# Patient Record
Sex: Female | Born: 1945 | Race: White | Hispanic: No | Smoking: Former smoker
Health system: Southern US, Community
[De-identification: ages and names within clinical notes are randomized; demographics above are authoritative.]

## PROBLEM LIST (undated history)

## (undated) DIAGNOSIS — C539 Malignant neoplasm of cervix uteri, unspecified: Secondary | ICD-10-CM

## (undated) DIAGNOSIS — I4891 Unspecified atrial fibrillation: Secondary | ICD-10-CM

## (undated) DIAGNOSIS — M1990 Unspecified osteoarthritis, unspecified site: Secondary | ICD-10-CM

## (undated) DIAGNOSIS — Z950 Presence of cardiac pacemaker: Secondary | ICD-10-CM

## (undated) DIAGNOSIS — G4730 Sleep apnea, unspecified: Secondary | ICD-10-CM

## (undated) DIAGNOSIS — I4821 Permanent atrial fibrillation: Secondary | ICD-10-CM

## (undated) DIAGNOSIS — K219 Gastro-esophageal reflux disease without esophagitis: Secondary | ICD-10-CM

## (undated) DIAGNOSIS — L0390 Cellulitis, unspecified: Secondary | ICD-10-CM

## (undated) DIAGNOSIS — I428 Other cardiomyopathies: Secondary | ICD-10-CM

## (undated) DIAGNOSIS — D649 Anemia, unspecified: Secondary | ICD-10-CM

## (undated) DIAGNOSIS — K5792 Diverticulitis of intestine, part unspecified, without perforation or abscess without bleeding: Secondary | ICD-10-CM

## (undated) DIAGNOSIS — N200 Calculus of kidney: Secondary | ICD-10-CM

## (undated) DIAGNOSIS — I5042 Chronic combined systolic (congestive) and diastolic (congestive) heart failure: Secondary | ICD-10-CM

## (undated) DIAGNOSIS — I495 Sick sinus syndrome: Secondary | ICD-10-CM

## (undated) DIAGNOSIS — I10 Essential (primary) hypertension: Secondary | ICD-10-CM

## (undated) HISTORY — DX: Sick sinus syndrome: I49.5

## (undated) HISTORY — PX: EYE SURGERY: SHX253

## (undated) HISTORY — DX: Unspecified osteoarthritis, unspecified site: M19.90

## (undated) HISTORY — DX: Malignant neoplasm of cervix uteri, unspecified: C53.9

---

## 1989-09-25 DIAGNOSIS — C539 Malignant neoplasm of cervix uteri, unspecified: Secondary | ICD-10-CM

## 1989-09-25 HISTORY — PX: ABDOMINAL HYSTERECTOMY: SHX81

## 1989-09-25 HISTORY — DX: Malignant neoplasm of cervix uteri, unspecified: C53.9

## 1996-09-25 HISTORY — PX: COLONOSCOPY: SHX174

## 1998-06-04 ENCOUNTER — Ambulatory Visit: Admission: RE | Admit: 1998-06-04 | Discharge: 1998-06-04 | Payer: Self-pay | Admitting: *Deleted

## 1998-07-30 ENCOUNTER — Ambulatory Visit (HOSPITAL_COMMUNITY): Admission: RE | Admit: 1998-07-30 | Discharge: 1998-07-30 | Payer: Self-pay | Admitting: *Deleted

## 1998-12-19 ENCOUNTER — Encounter: Payer: Self-pay | Admitting: Emergency Medicine

## 1998-12-19 ENCOUNTER — Inpatient Hospital Stay (HOSPITAL_COMMUNITY): Admission: EM | Admit: 1998-12-19 | Discharge: 1998-12-22 | Payer: Self-pay | Admitting: Emergency Medicine

## 1998-12-21 ENCOUNTER — Encounter: Payer: Self-pay | Admitting: Internal Medicine

## 1999-02-04 ENCOUNTER — Inpatient Hospital Stay (HOSPITAL_COMMUNITY): Admission: EM | Admit: 1999-02-04 | Discharge: 1999-02-06 | Payer: Self-pay | Admitting: Emergency Medicine

## 1999-03-26 ENCOUNTER — Inpatient Hospital Stay (HOSPITAL_COMMUNITY): Admission: EM | Admit: 1999-03-26 | Discharge: 1999-04-05 | Payer: Self-pay | Admitting: *Deleted

## 1999-05-24 ENCOUNTER — Encounter: Payer: Self-pay | Admitting: *Deleted

## 1999-05-24 ENCOUNTER — Ambulatory Visit (HOSPITAL_COMMUNITY): Admission: RE | Admit: 1999-05-24 | Discharge: 1999-05-24 | Payer: Self-pay | Admitting: *Deleted

## 1999-07-11 ENCOUNTER — Ambulatory Visit (HOSPITAL_COMMUNITY): Admission: RE | Admit: 1999-07-11 | Discharge: 1999-07-11 | Payer: Self-pay | Admitting: Internal Medicine

## 1999-07-27 HISTORY — PX: PACEMAKER INSERTION: SHX728

## 1999-08-04 ENCOUNTER — Inpatient Hospital Stay (HOSPITAL_COMMUNITY): Admission: RE | Admit: 1999-08-04 | Discharge: 1999-08-06 | Payer: Self-pay | Admitting: Internal Medicine

## 1999-08-06 ENCOUNTER — Encounter: Payer: Self-pay | Admitting: Internal Medicine

## 2003-02-26 ENCOUNTER — Encounter: Payer: Self-pay | Admitting: Internal Medicine

## 2003-02-26 ENCOUNTER — Ambulatory Visit (HOSPITAL_BASED_OUTPATIENT_CLINIC_OR_DEPARTMENT_OTHER): Admission: RE | Admit: 2003-02-26 | Discharge: 2003-02-26 | Payer: Self-pay | Admitting: Family Medicine

## 2003-02-26 ENCOUNTER — Encounter: Payer: Self-pay | Admitting: Pulmonary Disease

## 2003-03-26 ENCOUNTER — Encounter: Payer: Self-pay | Admitting: Family Medicine

## 2003-03-26 ENCOUNTER — Ambulatory Visit (HOSPITAL_COMMUNITY): Admission: RE | Admit: 2003-03-26 | Discharge: 2003-03-26 | Payer: Self-pay | Admitting: Family Medicine

## 2004-08-23 ENCOUNTER — Ambulatory Visit: Payer: Self-pay | Admitting: Internal Medicine

## 2004-10-18 ENCOUNTER — Ambulatory Visit: Payer: Self-pay | Admitting: Internal Medicine

## 2004-11-25 ENCOUNTER — Ambulatory Visit: Payer: Self-pay | Admitting: Internal Medicine

## 2004-12-20 ENCOUNTER — Ambulatory Visit: Payer: Self-pay | Admitting: Internal Medicine

## 2005-01-24 ENCOUNTER — Ambulatory Visit: Payer: Self-pay | Admitting: Internal Medicine

## 2005-03-14 ENCOUNTER — Ambulatory Visit: Payer: Self-pay | Admitting: Internal Medicine

## 2005-04-21 ENCOUNTER — Ambulatory Visit: Payer: Self-pay | Admitting: Internal Medicine

## 2005-05-25 ENCOUNTER — Ambulatory Visit: Payer: Self-pay | Admitting: Internal Medicine

## 2005-06-04 ENCOUNTER — Emergency Department (HOSPITAL_COMMUNITY): Admission: EM | Admit: 2005-06-04 | Discharge: 2005-06-04 | Payer: Self-pay | Admitting: Emergency Medicine

## 2005-06-07 ENCOUNTER — Ambulatory Visit: Payer: Self-pay | Admitting: Cardiology

## 2005-06-08 ENCOUNTER — Ambulatory Visit: Payer: Self-pay

## 2005-06-09 ENCOUNTER — Ambulatory Visit: Payer: Self-pay

## 2005-06-19 ENCOUNTER — Ambulatory Visit: Payer: Self-pay | Admitting: Internal Medicine

## 2005-06-22 ENCOUNTER — Inpatient Hospital Stay (HOSPITAL_BASED_OUTPATIENT_CLINIC_OR_DEPARTMENT_OTHER): Admission: RE | Admit: 2005-06-22 | Discharge: 2005-06-22 | Payer: Self-pay | Admitting: Cardiology

## 2005-06-22 ENCOUNTER — Ambulatory Visit: Payer: Self-pay | Admitting: Cardiology

## 2005-07-05 ENCOUNTER — Ambulatory Visit: Payer: Self-pay

## 2005-07-17 ENCOUNTER — Ambulatory Visit: Payer: Self-pay | Admitting: Internal Medicine

## 2005-08-22 ENCOUNTER — Ambulatory Visit: Payer: Self-pay | Admitting: Internal Medicine

## 2005-10-05 ENCOUNTER — Ambulatory Visit: Payer: Self-pay | Admitting: *Deleted

## 2005-11-28 ENCOUNTER — Ambulatory Visit: Payer: Self-pay | Admitting: Internal Medicine

## 2005-12-25 ENCOUNTER — Ambulatory Visit: Payer: Self-pay | Admitting: Internal Medicine

## 2006-02-26 ENCOUNTER — Ambulatory Visit: Payer: Self-pay | Admitting: Internal Medicine

## 2006-03-26 ENCOUNTER — Ambulatory Visit: Payer: Self-pay | Admitting: Internal Medicine

## 2006-04-30 ENCOUNTER — Ambulatory Visit: Payer: Self-pay | Admitting: Internal Medicine

## 2006-05-25 ENCOUNTER — Ambulatory Visit: Payer: Self-pay | Admitting: Internal Medicine

## 2006-06-26 ENCOUNTER — Ambulatory Visit: Payer: Self-pay | Admitting: Internal Medicine

## 2006-07-24 ENCOUNTER — Ambulatory Visit: Payer: Self-pay | Admitting: Internal Medicine

## 2006-08-21 ENCOUNTER — Ambulatory Visit: Payer: Self-pay | Admitting: Internal Medicine

## 2006-09-21 ENCOUNTER — Ambulatory Visit: Payer: Self-pay | Admitting: Internal Medicine

## 2006-10-16 ENCOUNTER — Ambulatory Visit: Payer: Self-pay | Admitting: Internal Medicine

## 2006-11-13 ENCOUNTER — Ambulatory Visit: Payer: Self-pay | Admitting: Internal Medicine

## 2006-11-14 ENCOUNTER — Ambulatory Visit: Payer: Self-pay | Admitting: Internal Medicine

## 2006-12-11 ENCOUNTER — Ambulatory Visit: Payer: Self-pay | Admitting: Internal Medicine

## 2007-01-08 ENCOUNTER — Ambulatory Visit: Payer: Self-pay | Admitting: Internal Medicine

## 2007-02-05 ENCOUNTER — Ambulatory Visit: Payer: Self-pay | Admitting: Internal Medicine

## 2007-02-07 ENCOUNTER — Inpatient Hospital Stay (HOSPITAL_COMMUNITY): Admission: EM | Admit: 2007-02-07 | Discharge: 2007-02-11 | Payer: Self-pay | Admitting: Emergency Medicine

## 2007-02-08 ENCOUNTER — Ambulatory Visit: Payer: Self-pay | Admitting: *Deleted

## 2007-03-05 ENCOUNTER — Ambulatory Visit: Payer: Self-pay | Admitting: Internal Medicine

## 2007-04-02 ENCOUNTER — Ambulatory Visit: Payer: Self-pay | Admitting: Internal Medicine

## 2007-04-30 ENCOUNTER — Ambulatory Visit: Payer: Self-pay | Admitting: Internal Medicine

## 2007-05-28 ENCOUNTER — Ambulatory Visit: Payer: Self-pay | Admitting: Internal Medicine

## 2007-06-25 ENCOUNTER — Ambulatory Visit: Payer: Self-pay | Admitting: Internal Medicine

## 2007-07-23 ENCOUNTER — Ambulatory Visit: Payer: Self-pay | Admitting: Internal Medicine

## 2007-08-20 ENCOUNTER — Ambulatory Visit: Payer: Self-pay | Admitting: Internal Medicine

## 2007-09-17 ENCOUNTER — Ambulatory Visit: Payer: Self-pay | Admitting: Internal Medicine

## 2007-10-15 ENCOUNTER — Ambulatory Visit: Payer: Self-pay | Admitting: Internal Medicine

## 2007-11-29 ENCOUNTER — Ambulatory Visit: Payer: Self-pay | Admitting: Internal Medicine

## 2008-02-01 ENCOUNTER — Emergency Department (HOSPITAL_COMMUNITY): Admission: EM | Admit: 2008-02-01 | Discharge: 2008-02-02 | Payer: Self-pay | Admitting: Emergency Medicine

## 2008-04-14 ENCOUNTER — Ambulatory Visit: Payer: Self-pay | Admitting: Internal Medicine

## 2008-06-11 ENCOUNTER — Ambulatory Visit: Payer: Self-pay | Admitting: Cardiology

## 2008-06-24 ENCOUNTER — Ambulatory Visit: Payer: Self-pay | Admitting: Cardiology

## 2008-06-30 ENCOUNTER — Encounter: Payer: Self-pay | Admitting: Cardiology

## 2008-06-30 ENCOUNTER — Ambulatory Visit: Payer: Self-pay | Admitting: Cardiology

## 2008-06-30 ENCOUNTER — Encounter (HOSPITAL_COMMUNITY): Admission: RE | Admit: 2008-06-30 | Discharge: 2008-07-30 | Payer: Self-pay | Admitting: Cardiology

## 2008-07-03 ENCOUNTER — Inpatient Hospital Stay (HOSPITAL_COMMUNITY): Admission: EM | Admit: 2008-07-03 | Discharge: 2008-07-07 | Payer: Self-pay | Admitting: Cardiology

## 2008-07-03 ENCOUNTER — Ambulatory Visit: Payer: Self-pay | Admitting: Internal Medicine

## 2008-07-21 ENCOUNTER — Ambulatory Visit: Payer: Self-pay | Admitting: Cardiology

## 2008-10-13 ENCOUNTER — Ambulatory Visit: Payer: Self-pay | Admitting: Internal Medicine

## 2009-01-01 ENCOUNTER — Encounter: Payer: Self-pay | Admitting: Internal Medicine

## 2009-01-06 ENCOUNTER — Ambulatory Visit: Payer: Self-pay | Admitting: Internal Medicine

## 2009-02-11 ENCOUNTER — Encounter: Payer: Self-pay | Admitting: Internal Medicine

## 2009-02-17 ENCOUNTER — Encounter: Payer: Self-pay | Admitting: Pulmonary Disease

## 2009-02-17 ENCOUNTER — Encounter: Payer: Self-pay | Admitting: Internal Medicine

## 2009-04-13 ENCOUNTER — Ambulatory Visit: Payer: Self-pay | Admitting: Internal Medicine

## 2009-05-04 ENCOUNTER — Ambulatory Visit: Payer: Self-pay | Admitting: Pulmonary Disease

## 2009-05-04 DIAGNOSIS — I4821 Permanent atrial fibrillation: Secondary | ICD-10-CM

## 2009-05-04 DIAGNOSIS — I10 Essential (primary) hypertension: Secondary | ICD-10-CM | POA: Insufficient documentation

## 2009-05-04 DIAGNOSIS — J309 Allergic rhinitis, unspecified: Secondary | ICD-10-CM | POA: Insufficient documentation

## 2009-05-04 HISTORY — DX: Permanent atrial fibrillation: I48.21

## 2009-05-05 ENCOUNTER — Telehealth: Payer: Self-pay | Admitting: Pulmonary Disease

## 2009-05-05 DIAGNOSIS — G4733 Obstructive sleep apnea (adult) (pediatric): Secondary | ICD-10-CM

## 2009-05-24 ENCOUNTER — Encounter: Payer: Self-pay | Admitting: Pulmonary Disease

## 2009-06-09 ENCOUNTER — Encounter: Admission: RE | Admit: 2009-06-09 | Discharge: 2009-06-09 | Payer: Self-pay | Admitting: Rheumatology

## 2009-06-11 ENCOUNTER — Ambulatory Visit (HOSPITAL_COMMUNITY): Admission: RE | Admit: 2009-06-11 | Discharge: 2009-06-11 | Payer: Self-pay | Admitting: Rheumatology

## 2009-06-28 ENCOUNTER — Encounter: Payer: Self-pay | Admitting: Internal Medicine

## 2009-06-28 ENCOUNTER — Ambulatory Visit: Payer: Self-pay | Admitting: Cardiology

## 2009-09-29 ENCOUNTER — Ambulatory Visit: Payer: Self-pay | Admitting: Internal Medicine

## 2009-11-01 ENCOUNTER — Ambulatory Visit: Payer: Self-pay | Admitting: Pulmonary Disease

## 2010-01-19 ENCOUNTER — Ambulatory Visit: Payer: Self-pay | Admitting: Internal Medicine

## 2010-01-19 DIAGNOSIS — Z950 Presence of cardiac pacemaker: Secondary | ICD-10-CM

## 2010-01-19 HISTORY — DX: Presence of cardiac pacemaker: Z95.0

## 2010-01-20 ENCOUNTER — Encounter: Payer: Self-pay | Admitting: Internal Medicine

## 2010-03-10 ENCOUNTER — Encounter: Payer: Self-pay | Admitting: Pulmonary Disease

## 2010-03-30 ENCOUNTER — Ambulatory Visit: Payer: Self-pay | Admitting: Internal Medicine

## 2010-04-12 ENCOUNTER — Telehealth: Payer: Self-pay | Admitting: Pulmonary Disease

## 2010-04-28 ENCOUNTER — Telehealth: Payer: Self-pay | Admitting: Pulmonary Disease

## 2010-05-04 ENCOUNTER — Ambulatory Visit: Payer: Self-pay | Admitting: Internal Medicine

## 2010-05-27 ENCOUNTER — Encounter: Payer: Self-pay | Admitting: Pulmonary Disease

## 2010-05-27 ENCOUNTER — Ambulatory Visit (HOSPITAL_BASED_OUTPATIENT_CLINIC_OR_DEPARTMENT_OTHER): Admission: RE | Admit: 2010-05-27 | Discharge: 2010-05-27 | Payer: Self-pay | Admitting: Pulmonary Disease

## 2010-06-06 ENCOUNTER — Ambulatory Visit: Payer: Self-pay | Admitting: Pulmonary Disease

## 2010-06-08 ENCOUNTER — Encounter: Payer: Self-pay | Admitting: Internal Medicine

## 2010-06-08 ENCOUNTER — Ambulatory Visit: Payer: Self-pay | Admitting: Internal Medicine

## 2010-07-04 ENCOUNTER — Ambulatory Visit: Payer: Self-pay | Admitting: Pulmonary Disease

## 2010-07-06 ENCOUNTER — Ambulatory Visit: Payer: Self-pay | Admitting: Internal Medicine

## 2010-07-11 ENCOUNTER — Encounter: Payer: Self-pay | Admitting: Pulmonary Disease

## 2010-07-22 ENCOUNTER — Ambulatory Visit: Payer: Self-pay | Admitting: Cardiology

## 2010-07-22 ENCOUNTER — Encounter: Payer: Self-pay | Admitting: Internal Medicine

## 2010-08-02 ENCOUNTER — Encounter: Payer: Self-pay | Admitting: Pulmonary Disease

## 2010-08-22 ENCOUNTER — Ambulatory Visit: Payer: Self-pay | Admitting: Internal Medicine

## 2010-08-22 ENCOUNTER — Encounter (INDEPENDENT_AMBULATORY_CARE_PROVIDER_SITE_OTHER): Payer: Self-pay | Admitting: *Deleted

## 2010-08-23 ENCOUNTER — Encounter: Payer: Self-pay | Admitting: Internal Medicine

## 2010-08-24 LAB — CONVERTED CEMR LAB
BUN: 22 mg/dL (ref 6–23)
Basophils Absolute: 0 10*3/uL (ref 0.0–0.1)
Calcium: 9.5 mg/dL (ref 8.4–10.5)
Eosinophils Absolute: 0.1 10*3/uL (ref 0.0–0.7)
Eosinophils Relative: 2 % (ref 0–5)
HCT: 39.9 % (ref 36.0–46.0)
Lymphocytes Relative: 31 % (ref 12–46)
Lymphs Abs: 2.1 10*3/uL (ref 0.7–4.0)
MCHC: 33.1 g/dL (ref 30.0–36.0)
Neutro Abs: 4.2 10*3/uL (ref 1.7–7.7)
Neutrophils Relative %: 61 % (ref 43–77)
Platelets: 146 10*3/uL — ABNORMAL LOW (ref 150–400)
Potassium: 4.4 meq/L (ref 3.5–5.3)
RBC: 4.2 M/uL (ref 3.87–5.11)
Sodium: 141 meq/L (ref 135–145)
aPTT: 30 s (ref 24–37)

## 2010-08-26 ENCOUNTER — Ambulatory Visit (HOSPITAL_COMMUNITY)
Admission: RE | Admit: 2010-08-26 | Discharge: 2010-08-26 | Payer: Self-pay | Source: Home / Self Care | Admitting: Internal Medicine

## 2010-08-26 ENCOUNTER — Ambulatory Visit: Payer: Self-pay | Admitting: Internal Medicine

## 2010-08-31 ENCOUNTER — Encounter: Payer: Self-pay | Admitting: Internal Medicine

## 2010-09-09 ENCOUNTER — Ambulatory Visit: Payer: Self-pay | Admitting: Cardiology

## 2010-09-09 ENCOUNTER — Encounter: Payer: Self-pay | Admitting: Internal Medicine

## 2010-10-25 NOTE — Cardiovascular Report (Signed)
Summary: TTM   TTM   Imported By: Sallee Provencal 10/12/2009 14:41:37  _____________________________________________________________________  External Attachment:    Type:   Image     Comment:   External Document

## 2010-10-25 NOTE — Assessment & Plan Note (Signed)
Summary: PACER CHECK/[facility] [place]   Visit Type:  Follow-up Referring Provider:  self Primary Provider:  Dr. Ocie Cornfield, [facility]    History of Present Illness: [name] [name] returns for followup.  She is a pleasant 65 yo woman with CHB, and permanent atrial fibrillation.  She is morbidly obese.  She has not had any worsening dyspnea or c/p since I last saw her.  Current Medications (verified): 1)  Hydrocodone-Acetaminophen 10-500 Mg Tabs (Hydrocodone-Acetaminophen) .... Take 1 Tablet By Mouth Three Times A Day 2)  Klor-Con 10 10 Meq Cr-Tabs (Potassium Chloride) .... Take 1 Tablet By Mouth Three Times A Day 3)  Lisinopril-Hydrochlorothiazide 10-12.5 Mg Tabs (Lisinopril-Hydrochlorothiazide) .... Take 1/2 Tablet By Mouth Once A Day 4)  Allopurinol 300 Mg Tabs (Allopurinol) .... Take 1 Tablet By Mouth Once A Day 5)  Colchicine-Probenecid 0.5-500 Mg Tabs (Colchicine-Probenecid) .... Take 2 Tablet By Mouth Two Times A Day 6)  Furosemide 40 Mg Tabs (Furosemide) .... As Needed 7)  Cod Liver Oil  Caps ([place]) .... Take 1 Tablet By Mouth Once A Day 8)  B-12 .... Take 1 Tablet By Mouth Once A Day 9)  Multivitamins   Tabs (Multiple Vitamin) .... Take 1 Tablet By Mouth Once A Day 10)  Aspirin 325 Mg  Tabs (Aspirin) .... Take 1 Tablet By Mouth Once A Day 11)  Auto Bipap 17/15 (Avg Pr)  Allergies (verified): 1)  ! Pcn 2)  ! Cipro 3)  ! Codeine 4)  ! Beta Blockers 5)  ! Prednisone 6)  ! * Peppermint 7)  ! * Latex  Past History:  Past Medical History: Last updated: 05/04/2009 Allergic Rhinitis Atrial Fibrillation Hypertension Cervical cancer  Past Surgical History: Last updated: 05/04/2009 Hysterectomy 1991 Cataract Extraction bilateral Cardiac Pacemaker Aug 05, 1999 Dr. Cristopher Peru  Review of Systems  The patient denies chest pain, syncope, dyspnea on exertion, and peripheral edema.    Vital Signs:  Patient profile:   65 year old female Weight:      350 pounds Pulse rate:    108 / minute BP sitting:   152 / 90  (right arm)  Vitals Entered By: Doretha Sou, CNA (January 19, 2010 3:03 PM)  Physical Exam  General:  Obese, well developed, well nourished, in no acute distress.  HEENT: normal Neck: supple. No JVD. Carotids 2+ bilaterally no bruits Cor: RRR no rubs, gallops or murmur Lungs: CTA. Well healed PPM incision. Ab: soft, nontender. nondistended. No HSM. Good bowel sounds Ext: warm. no cyanosis, clubbing or edema Neuro: alert and oriented. Grossly nonfocal. affect pleasant    PPM Specifications Following MD:  Cristopher Peru, MD     PPM Vendor:  [employer]     PPM Model Number:  [id number]     PPM Serial Number:  [id number] PPM DOI:  08/05/1999     PPM Implanting MD:  Cristopher Peru, MD  Lead 1    Location: [facility]     DOI: 08/05/1999     Model #: [id number]     Serial #: [id number]     Status: active  Magnet Response Rate:  BOL 98.6 ERI 86.3  Indications:  A-fib A-V node ablation  Explantation Comments:  TTM's with [employer] Pacemaker dependent  PPM Follow Up Remote Check?  No Battery Voltage:  2.72 V     Battery Est. Longevity:  11 months     Pacer Dependent:  Yes     Right Ventricle  Impedance: 654 ohms, Threshold: 0.75 V at  0.5 msec  Episodes Coumadin:  No Ventricular Pacing:  100%  Parameters Mode:  VVIR     Lower Rate Limit:  90     Upper Rate Limit:  120 Next Cardiology Appt Due:  06/25/2010 Tech Comments:  No parameter changes.  Device function normal.  ROV 6 months [facility] clinic. Alma Friendly, LPN  January 19, 1609 9:60 PM  MD Comments:  Agree with above.  Impression & Recommendations:  Problem # 1:  PACEMAKER, PERMANENT (ICD-V45.01) Her device is working normally.  Will recheck inseveral months.  She is pacemaker dependent.  Problem # 2:  HYPERTENSION (ICD-401.9) Her blood pressure has been reasonably well controlled considering her morbid obesity.  Continue meds as below. Her updated medication list for this problem includes:     Lisinopril-hydrochlorothiazide 10-12.5 Mg Tabs (Lisinopril-hydrochlorothiazide) .Marland Kitchen... Take 1/2 tablet by mouth once a day    Furosemide 40 Mg Tabs (Furosemide) .Marland Kitchen... As needed    Aspirin 325 Mg Tabs (Aspirin) .Marland Kitchen... Take 1 tablet by mouth once a day  Problem # 3:  Hx of ATRIAL FIBRILLATION (ICD-427.31) She has chronic atrial fibrillation and CHB after AV node ablation. Her updated medication list for this problem includes:    Aspirin 325 Mg Tabs (Aspirin) .Marland Kitchen... Take 1 tablet by mouth once a day  Patient Instructions: 1)  Your physician recommends that you schedule a follow-up appointment in: 1 year 2)  Your physician recommends that you continue on your current medications as directed. Please refer to the Current Medication list given to you today.

## 2010-10-25 NOTE — Cardiovascular Report (Signed)
Summary: TTM   TTM   Imported By: Sallee Provencal 04/14/2010 16:12:32  _____________________________________________________________________  External Attachment:    Type:   Image     Comment:   External Document

## 2010-10-25 NOTE — Procedures (Signed)
Summary: 6 mth f/u per checkout on 01/19/10/tg   Current Medications (verified): 1)  Hydrocodone-Acetaminophen 10-500 Mg Tabs (Hydrocodone-Acetaminophen) .... Take 1 Tablet By Mouth Three Times A Day 2)  Klor-Con 10 10 Meq Cr-Tabs (Potassium Chloride) .... Take 1 Tablet By Mouth Three Times A Day 3)  Lisinopril-Hydrochlorothiazide 10-12.5 Mg Tabs (Lisinopril-Hydrochlorothiazide) .... Take 1/2 Tablet By Mouth Once A Day 4)  Allopurinol 300 Mg Tabs (Allopurinol) .... Take 1 Tablet By Mouth Once A Day 5)  Colchicine-Probenecid 0.5-500 Mg Tabs (Colchicine-Probenecid) .... Take 1 Tablet By Mouth Two Times A Day 6)  Furosemide 40 Mg Tabs (Furosemide) .... As Needed 7)  Cod Liver Oil  Caps ([place]) .... Take 1 Tablet By Mouth Once A Day 8)  B-12 .... Take 1 Tablet By Mouth Once A Day 9)  Multivitamins   Tabs (Multiple Vitamin) .... Take 1 Tablet By Mouth Once A Day 10)  Aspirin 325 Mg  Tabs (Aspirin) .... Take 1 Tablet By Mouth Once A Day 11)  Auto Bipap 19/12 (Avg Pr) .... [facility] 12)  Omeprazole 20 Mg Cpdr (Omeprazole) .... Take 1 Tablet By Mouth Once A Day  Allergies (verified): 1)  ! Pcn 2)  ! Cipro 3)  ! Codeine 4)  ! Beta Blockers 5)  ! Prednisone 6)  ! * Peppermint 7)  ! * Latex   PPM Specifications Following MD:  Cristopher Peru, MD     PPM Vendor:  [employer]     PPM Model Number:  [id number]     PPM Serial Number:  [id number] PPM DOI:  08/05/1999     PPM Implanting MD:  Cristopher Peru, MD  Lead 1    Location: RV     DOI: 08/05/1999     Model #: [id number]     Serial #: [id number]     Status: active  Magnet Response Rate:  BOL 98.6 ERI 86.3  Indications:  A-fib A-V node ablation  Explantation Comments:  TTM's with Mednet Pacemaker dependent  PPM Follow Up Remote Check?  No Battery Voltage:  2.59 V     Battery Est. Longevity:  1 month per Merchant navy officer Dependent:  Yes     Right Ventricle  Impedance: 586 ohms, Threshold: 0.625 V at 0.5 msec  Episodes Coumadin:  No Ventricular  Pacing:  100%  Parameters Mode:  VVIR     Lower Rate Limit:  90     Upper Rate Limit:  120 Next Cardiology Appt Due:  07/26/2010 Tech Comments:  Battery near [employer].  Estimated longevity 1 month per tech services @ [employer].  ROV 1 month with Dr. Lovena Le in [facility]. Alma Friendly, LPN  July 22, 7105 11:27 AM

## 2010-10-25 NOTE — Letter (Signed)
Summary: [place] By: Bubba Hales 03/25/2010 10:28:29  _____________________________________________________________________  External Attachment:    Type:   Image     Comment:   External Document

## 2010-10-25 NOTE — Assessment & Plan Note (Signed)
Summary: 1 MTH F/U PER CHECKOUT ON 07/22/10/TG   Visit Type:  Follow-up Referring Provider:  self Primary Provider:  Dr. [name], [facility] (ENT)  CC:  no cardiology complaints.  History of Present Illness: Ms. Pasqual returns for followup.  She is a pleasant 65 yo woman with CHB, and permanent atrial fibrillation.  She is morbidly obese.  She has not had any worsening dyspnea or c/p since I last saw her. She notes that her blood pressure has been well controlled at home.  Current Medications (verified): 1)  Hydrocodone-Acetaminophen 10-500 Mg Tabs (Hydrocodone-Acetaminophen) .... Take 1 Tablet By Mouth Three Times A Day 2)  Klor-Con 10 10 Meq Cr-Tabs (Potassium Chloride) .... Take 1 Tablet By Mouth Three Times A Day 3)  Lisinopril-Hydrochlorothiazide 10-12.5 Mg Tabs (Lisinopril-Hydrochlorothiazide) .... Take 1/2 Tablet By Mouth Once A Day 4)  Allopurinol 300 Mg Tabs (Allopurinol) .... Take 1 Tablet By Mouth Once A Day 5)  Colchicine-Probenecid 0.5-500 Mg Tabs (Colchicine-Probenecid) .... Take 1 Tablet By Mouth Two Times A Day 6)  Furosemide 40 Mg Tabs (Furosemide) .... As Needed 7)  Cod Liver Oil  Caps ([place]) .... Take 1 Tablet By Mouth Once A Day 8)  B-12 .... Take 1 Tablet By Mouth Once A Day 9)  Multivitamins   Tabs (Multiple Vitamin) .... Take 1 Tablet By Mouth Once A Day 10)  Aspirin 325 Mg  Tabs (Aspirin) .... Take 1 Tablet By Mouth Once A Day 11)  Auto Bipap 19/12 (Avg Pr) .... [facility] 12)  Omeprazole 20 Mg Cpdr (Omeprazole) .... Take 1 Tablet By Mouth Once A Day  Allergies (verified): 1)  ! Pcn 2)  ! Cipro 3)  ! Codeine 4)  ! Beta Blockers 5)  ! Prednisone 6)  ! * Peppermint 7)  ! * Latex  Comments:  Nurse/Medical Assistant: patient and i reviewed med list from previous ov and stated all meds are the same  Past History:  Past Medical History: Last updated: 05/04/2009 Allergic Rhinitis Atrial Fibrillation Hypertension Cervical  cancer  Past Surgical History: Last updated: 05/04/2009 Hysterectomy 1991 Cataract Extraction bilateral Cardiac Pacemaker Aug 05, 1999 Dr. Cristopher Peru  Review of Systems  The patient denies chest pain, syncope, dyspnea on exertion, and peripheral edema.    Vital Signs:  Patient profile:   65 year old female Weight:      357 pounds BMI:     57.83 Pulse rate:   102 / minute BP sitting:   181 / 93  (right arm)  Vitals Entered By: Doretha Sou, CNA (August 22, 2010 2:51 PM)  Physical Exam  General:  Obese, well developed, well nourished, in no acute distress.  HEENT: normal Neck: supple. No JVD. Carotids 2+ bilaterally no bruits Cor: RRR no rubs, gallops or murmur Lungs: CTA. Well healed PPM incision. Ab: soft, nontender. nondistended. No HSM. Good bowel sounds Ext: warm. no cyanosis, clubbing or edema Neuro: alert and oriented. Grossly nonfocal. affect pleasant    PPM Specifications Following MD:  Cristopher Peru, MD     PPM Vendor:  [employer]     PPM Model Number:  [id number]     PPM Serial Number:  [id number] PPM DOI:  08/05/1999     PPM Implanting MD:  Cristopher Peru, MD  Lead 1    Location: [facility]     DOI: 08/05/1999     Model #: [id number]     Serial #: [id number]     Status: active  Magnet Response  Rate:  BOL 98.6 ERI 86.3  Indications:  A-fib A-V node ablation  Explantation Comments:  TTM's with [employer]net Pacemaker dependent  PPM Follow Up Remote Check?  No Battery Voltage:  2.56 V     Battery Est. Longevity:  <1 month     Pacer Dependent:  Yes     Right Ventricle  Impedance: 581 ohms, Threshold: 0.75 V at 0.5 msec  Episodes Coumadin:  No Ventricular Pacing:  100%  Parameters Mode:  VVIR     Lower Rate Limit:  90     Upper Rate Limit:  120 Tech Comments:  No parameter changes.  Device function normal. Estimated longevity < 1 months. Alma Friendly, LPN  August 23, 4539 3:25 PM  MD Comments:  Agree with above.  Impression & Recommendations:  Problem # 1:  PACEMAKER,  PERMANENT (ICD-V45.01)  Her device has reached ERI.  Will schedule PPM gen change in the next few weeks.  Order: PPM generator change.  Orders: Pacer Materials engineer)  Problem # 2:  HYPERTENSION (ICD-401.9)  Her  blood pressure is well controlled at home though not here..  Continue current meds. A low sodium diet is requested.  Orders: T-Basic Metabolic Panel ([id number]) T-CBC w/Diff [phone])  Problem # 3:  Hx of ATRIAL FIBRILLATION (ICD-427.31)  Her ventricular rates are well controlled after AV node ablation. Her updated medication list for this problem includes:    Aspirin 325 Mg Tabs (Aspirin) .Marland Kitchen... Take 1 tablet by mouth once a day  Orders: T-Protime, Auto ([id number]) T-PTT ([id number])  Patient Instructions: 1)  Your physician recommends that you schedule a follow-up appointment in: after procedure 2)  Your physician has recommended that you have a pacemaker battery change out

## 2010-10-25 NOTE — Cardiovascular Report (Signed)
Summary: TTM   TTM   Imported By: Sallee Provencal 06/22/2010 11:29:33  _____________________________________________________________________  External Attachment:    Type:   Image     Comment:   External Document

## 2010-10-25 NOTE — Letter (Signed)
Summary: LMN/[facility]  LMN/[facility]   Imported By: Bubba Hales 07/19/2010 09:11:10  _____________________________________________________________________  External Attachment:    Type:   Image     Comment:   External Document

## 2010-10-25 NOTE — Cardiovascular Report (Signed)
Summary: Office Visit   Office Visit   Imported By: Sallee Provencal 07/29/2010 09:39:09  _____________________________________________________________________  External Attachment:    Type:   Image     Comment:   External Document

## 2010-10-25 NOTE — Progress Notes (Signed)
Summary: bipap supplies/ sleep study  Phone Note Call from Patient Call back at Home Phone [phone]   Caller: Patient Call For: alva Summary of Call: pt says that [facility] will not give her supplies for her bipap w/out a "baseline sleep study".  Initial call taken by: Cooper Render, CNA,  April 12, 2010 1:05 PM  Follow-up for Phone Call        called and spoke with pt and she stated that she turned 39 in june and is now on medicare and [facility] is telling her that medicare requires the results of sleep study before they will send her any of the equipment for the bi-pap. pts last sleep study was done in 2004,   please advise. thanks [place]  April 12, 2010 2:08 PM   Additional Follow-up for Phone Call Additional follow up Details #1::        pl try to find baseline PSG from e chart or sleep lab 20410 - if not available, try another supplier (she has been on BiPAP since 1996!)  let me know if nothing works Additional Follow-up by: Leanna Sato. Elsworth Soho MD,  April 12, 2010 2:17 PM    Additional Follow-up for Phone Call Additional follow up Details #2::    will forward message to Janett Billow R to hunt down these records.  Matthew Folks LPN  April 12, 9380 8:29 PM   Records are [facility]  Records Department Catano Scales in charge - Dr. Danelle Earthly. Pt said they wouldn't let her request them said we had to request them.  Follow-up by: Zigmund Gottron,  April 13, 2010 2:07 PM  Additional Follow-up for Phone Call Additional follow up Details #3:: Details for Additional Follow-up Action Taken: Telephone number [phone] ext [phone] Fax [phone] Faxed letterhead requesting sleep study. Iran Planas CMA  April 13, 2010 5:36 PM

## 2010-10-25 NOTE — Letter (Signed)
Summary: Implantable Device Instructions  [facility] [place]. [address], [place] [address]   Phone: [phone]  Fax: [phone]      Implantable Device Instructions  You are scheduled for:  _____ Permanent Transvenous Pacemaker _____ Implantable Cardioverter Defibrillator _____ Implantable Loop Recorder __x___ Generator Change  on 08/26/2010 with Dr. Lovena Le.  1.  Please arrive at the [facility] at 2:00pm on the day of your procedure.  2.  Do not eat or drink the night before your procedure.  3.  Complete lab work on today    4.  Do NOT take these medications for linsopril / hydrochlorathiazide and furosemide the morning of your procedure:    5.  Plan for an overnight stay.  Bring your insurance cards and a list of your medications.  6.  Wash your chest and neck with antibacterial soap (any brand) the evening before and the morning of your procedure.  Rinse well.  7.  Education material received:     Pacemaker _____           ICD _____           Arrhythmia _____  *If you have ANY questions after you get home, please call the office [phone].  *Every attempt is made to prevent procedures from being rescheduled.  Due to the nauture of Electrophysiology, rescheduling can happen.  The physician is always aware and directs the staff when this occurs.

## 2010-10-25 NOTE — Assessment & Plan Note (Signed)
Summary: 8 months/apc   Visit Type:  Follow-up Copy to:  self Primary Provider/Referring Provider:  Dr. [name], [facility] (ENT)  CC:  Pt here for follow up.  History of Present Illness: 63/F, morbidly obese ex- smoker for FU of obstructive sleep apnea. She has been maintained on BiPAP since 1994, initially 15/11. 6/04 >>wt 360 lbs >> BiPAP was re-titarted to 19/12 to abolish snoring although 15/11 was adequate for events.  CPAP was titrated again  on 02/17/09 ([facility], Dr Radford Pax) to 13 cm >> she did not tolerate CPAP. Supplier is [facility],uses small nasal mask with humidity has gained 7 lbs since the original study. She has no regular sleep habits, moves around ina  motorised scooter. Epworth Sleepiness Score 11.  July 04, 2010 2:19 PM  Baseline PSG 9/11 showed AHI 30/h, RDI 86/h & desaturation to 87%. She is annoyed that she had to redo this test for Medicare purposes. Reviewed compliance data 8/17-8/30 >> autoBIPAP - avg pr 17/15, good compliance, no residuals. mask ok, pressure ok, denies excessive daytime somnolence , no choking episodes, no bed partner hisotry available for objective data      Preventive Screening-Counseling & Management  Alcohol-Tobacco     Alcohol drinks/day: 0     Smoking Status: quit     Packs/Day: 0.75     Year Started: 1963     Year Quit: 1987  Current Medications (verified): 1)  Hydrocodone-Acetaminophen 10-500 Mg Tabs (Hydrocodone-Acetaminophen) .... Take 1 Tablet By Mouth Three Times A Day 2)  Klor-Con 10 10 Meq Cr-Tabs (Potassium Chloride) .... Take 1 Tablet By Mouth Three Times A Day 3)  Lisinopril-Hydrochlorothiazide 10-12.5 Mg Tabs (Lisinopril-Hydrochlorothiazide) .... Take 1/2 Tablet By Mouth Once A Day 4)  Allopurinol 300 Mg Tabs (Allopurinol) .... Take 1 Tablet By Mouth Once A Day 5)  Colchicine-Probenecid 0.5-500 Mg Tabs (Colchicine-Probenecid) .... Take 1 Tablet By Mouth Two Times A Day 6)  Furosemide 40  Mg Tabs (Furosemide) .... As Needed 7)  Cod Liver Oil  Caps ([place]) .... Take 1 Tablet By Mouth Once A Day 8)  B-12 .... Take 1 Tablet By Mouth Once A Day 9)  Multivitamins   Tabs (Multiple Vitamin) .... Take 1 Tablet By Mouth Once A Day 10)  Aspirin 325 Mg  Tabs (Aspirin) .... Take 1 Tablet By Mouth Once A Day 11)  Auto Bipap 19/12 (Avg Pr) .... [facility] 12)  Omeprazole 20 Mg Cpdr (Omeprazole) .... Take 1 Tablet By Mouth Once A Day  Allergies: 1)  ! Pcn 2)  ! Cipro 3)  ! Codeine 4)  ! Beta Blockers 5)  ! Prednisone 6)  ! * Peppermint 7)  ! * Latex  Past History:  Past Medical History: Last updated: 05/04/2009 Allergic Rhinitis Atrial Fibrillation Hypertension Cervical cancer  Social History: Last updated: 05/04/2009 Patient states former smoker.  (1982) Lives alone, widowed, disabled 1990  Social History: Packs/Day:  0.75 Alcohol drinks/day:  0  Review of Systems  The patient denies anorexia, fever, weight loss, weight gain, vision loss, decreased hearing, hoarseness, chest pain, syncope, dyspnea on exertion, peripheral edema, prolonged cough, headaches, hemoptysis, abdominal pain, melena, hematochezia, severe indigestion/heartburn, hematuria, muscle weakness, suspicious skin lesions, difficulty walking, depression, unusual weight change, abnormal bleeding, enlarged lymph nodes, and angioedema.    Vital Signs:  Patient profile:   65 year old female Height:      66 inches Weight:      359.4 pounds BMI:  58.22 O2 Sat:      95 % on Room air Temp:     98.1 degrees F oral Pulse rate:   101 / minute BP sitting:   146 / 90  (left arm) Cuff size:   regular  Vitals Entered By: Iran Planas CMA (July 04, 2010 1:58 PM)  O2 Flow:  Room air CC: Pt here for follow up Comments Medications reviewed with patient Verified contact number and pharmacy with patient Iran Planas CMA  July 04, 2010 2:09 PM    Physical Exam  Additional Exam:  wt 359  July 13, 2010  Gen. [place], morbidly obese,  in no distress, normal affect ENT - no lesions, no post nasal drip, class 3 airway Neck: No JVD, no thyromegaly, no carotid bruits Lungs: no use of accessory muscles, no dullness to percussion, clear without rales or rhonchi  Cardiovascular: Rhythm regular, heart sounds  normal, no murmurs or gallops, no peripheral edema Musculoskeletal: No deformities, no cyanosis or clubbing      Impression & Recommendations:  Problem # 1:  OBSTRUCTIVE SLEEP APNEA (ICD-327.23) Reports good compliance Compliance encouraged, wt loss emphasized, asked to avoid meds with sedative side effects, cautioned against driving when sleepy. Obtain download if worsening symptoms. No need for adding modafinil at this time.  Medications Added to Medication List This Visit: 1)  Colchicine-probenecid 0.5-500 Mg Tabs (Colchicine-probenecid) .... Take 1 tablet by mouth two times a day 2)  Auto Bipap 19/12 (avg Pr)  .... [facility] 3)  Auto Bipap 19/12 (avg Pr)  4)  Omeprazole 20 Mg Cpdr (Omeprazole) .... Take 1 tablet by mouth once a day  Other Orders: DME Referral (DME) Est. Patient Level III (03524)  Patient Instructions: 1)  Copy sent to: Dr Dorthy Cooler 2)  Please schedule a follow-up appointment in 1 year. 3)  we will check download & make changes as needed    Immunization History:  Pneumovax Immunization History:    Pneumovax:  historical (03/04/2010)  Not Administered:    Influenza Vaccine not given due to: declined   Appended Document: 8 months/apc autoBiPAP download 10/25-11/8/11 >> avg pr 18/14, good compliance, AHI 3/h, no leak

## 2010-10-25 NOTE — Assessment & Plan Note (Signed)
Summary: f/u 6 months///kp   Visit Type:  Follow-up Copy to:  self Primary Provider/Referring Provider:  Dr. Ocie Cornfield, [facility]   CC:  Pt here for follow up. Pt states no complaints. Pt states is using BiPAP 3 to 5 hours every night.  History of Present Illness: 65/F, morbidly obese ex- smoker for evaluation of obstructive sleep apnea. She has been maintained on BiPAP since 1994, initially 15/11. 6/04 >>wt 360 lbs >> BiPAP was re-titarted to 19/12 to abolish snoring although 15/11 was adequate for events.  CPAP was titrated again  on 02/17/09 ([facility], Dr Radford Pax) to 13 cm >> she did not tolerate CPAP. Supplier is [facility],uses small nasal mask with humidity has gained 7 lbs since the original study. She has no regular sleep habits, moves around ina  motorised scooter. Epworth Sleepiness Score 11.  November 01, 2009 1:45 PM  Reviewed compliance data 8/17-8/30 >> autoBIPAP - avg pr 17/15, good compliance, no residuals. mask ok, pressure ok, denies excessive daytime somnolence , no choking episodes, no bed partner hisotry available for objective data      Current Medications (verified): 1)  Hydrocodone-Acetaminophen 10-500 Mg Tabs (Hydrocodone-Acetaminophen) .... Take 1 Tablet By Mouth Three Times A Day 2)  Klor-Con 10 10 Meq Cr-Tabs (Potassium Chloride) .... Take 1 Tablet By Mouth Three Times A Day 3)  Lisinopril-Hydrochlorothiazide 10-12.5 Mg Tabs (Lisinopril-Hydrochlorothiazide) .... Take 1/2 Tablet By Mouth Once A Day 4)  Allopurinol 300 Mg Tabs (Allopurinol) .... Take 1 Tablet By Mouth Once A Day 5)  Colchicine-Probenecid 0.5-500 Mg Tabs (Colchicine-Probenecid) .... Take 2 Tablet By Mouth Two Times A Day 6)  Furosemide 40 Mg Tabs (Furosemide) .... As Needed 7)  Cod Liver Oil  Caps ([place]) .... Take 1 Tablet By Mouth Once A Day 8)  B-12 .... Take 1 Tablet By Mouth Once A Day 9)  Multivitamins   Tabs (Multiple Vitamin) .... Take 1 Tablet By Mouth Once A Day 10)   Aspirin 325 Mg  Tabs (Aspirin) .... Take 1 Tablet By Mouth Once A Day  Allergies (verified): 1)  ! Pcn 2)  ! Cipro 3)  ! Codeine 4)  ! Beta Blockers 5)  ! Prednisone 6)  ! * Peppermint 7)  ! * Latex  Past History:  Past Medical History: Last updated: 05/04/2009 Allergic Rhinitis Atrial Fibrillation Hypertension Cervical cancer  Social History: Last updated: 05/04/2009 Patient states former smoker.  (1982) Lives alone, widowed, disabled 1990  Review of Systems  The patient denies anorexia, fever, weight loss, weight gain, vision loss, decreased hearing, hoarseness, chest pain, syncope, dyspnea on exertion, peripheral edema, prolonged cough, headaches, hemoptysis, abdominal pain, melena, hematochezia, severe indigestion/heartburn, hematuria, muscle weakness, suspicious skin lesions, difficulty walking, depression, unusual weight change, and abnormal bleeding.    Vital Signs:  Patient profile:   65 year old female Height:      66 inches O2 Sat:      97 % on Room air Temp:     97.7 degrees F oral Pulse rate:   90 / minute BP sitting:   122 / 80  (left arm) Cuff size:   regular  Vitals Entered By: Iran Planas CMA (November 01, 2009 1:38 PM)  O2 Flow:  Room air CC: Pt here for follow up. Pt states no complaints. Pt states is using BiPAP 3 to 5 hours every night Comments Medications reviewed with patient Verified pt's contact number Iran Planas CMA  November 01, 2009 1:39  PM    Physical Exam  Additional Exam:  Gen. Pleasant, morbidly obese,  in no distress, normal affect ENT - no lesions, no post nasal drip, class 3 airway Neck: No JVD, no thyromegaly, no carotid bruits Lungs: no use of accessory muscles, no dullness to percussion, clear without rales or rhonchi  Cardiovascular: Rhythm regular, heart sounds  normal, no murmurs or gallops, no peripheral edema Musculoskeletal: No deformities, no cyanosis or clubbing      Impression &  Recommendations:  Problem # 1:  OBSTRUCTIVE SLEEP APNEA (ICD-327.23)  Reports good compliance Compliance encouraged, wt loss emphasized, asked to avoid meds with sedative side effects, cautioned against driving when sleepy. Obtain download if worsening symptoms. No need for adding modafinil at this time.  Orders: Est. Patient Level III (93406)  Medications Added to Medication List This Visit: 1)  Colchicine-probenecid 0.5-500 Mg Tabs (Colchicine-probenecid) .... Take 2 tablet by mouth two times a day 2)  Auto Bipap 17/15 (avg Pr)   Patient Instructions: 1)  Please schedule a follow-up appointment in 8 months. 2)  We will obtain download if you have symptoms related to sleep or excessive sleepiness

## 2010-10-25 NOTE — Cardiovascular Report (Signed)
Summary: TTM   TTM   Imported By: Sallee Provencal 05/26/2010 11:40:34  _____________________________________________________________________  External Attachment:    Type:   Image     Comment:   External Document

## 2010-10-25 NOTE — Progress Notes (Signed)
Summary: sleep study/ bipap supplies-LMTCB x 1  Phone Note Call from Patient Call back at Home Phone [phone]   Caller: Patient Call For: [name] [name] Summary of Call: pt is upset that [facility] keeps calling her re: copy of sleep study that she had "yrs ago" (see last emr msg 7/19). she wants to know if she needs to have a new sleep study done. also wants someone to call [facility] and ask them to stop calling her everyday. note per pt. dr Asa Lente williams retired.  Initial call taken by: Cooper Render, CNA,  April 28, 2010 2:33 PM  Follow-up for Phone Call        see phone note from 04-12-2010.  Janett Billow R have you seen this pt's sleep study results that you requested from [facility].  Pt continues to get calls from [place] requesting sleep study results. Please advise.  Thanks. Matthew Folks LPN  April 28, 3566 2:47 PM   Dr. Elsworth Soho please advise if you have received the sleep study for this pt. Iran Planas CMA  April 28, 2010 2:48 PM   Additional Follow-up for Phone Call Additional follow up Details #1::        if unbale to get from [facility] , we can order split study - with BipAP as needed     Additional Follow-up by: Leanna Sato. Elsworth Soho MD,  April 29, 2010 12:19 PM    Additional Follow-up for Phone Call Additional follow up Details #2::    I called the med rec dept at [facility] (info on 7/19 phone note) to see why records were not faxed.  Had to leave a msg on voicemail.  Will await a call back [place] D[place]  April 29, 2010 1:15 PM   received fax - pt not seen at [facility]  Follow-up by: Leanna Sato. Elsworth Soho MD,  April 29, 2010 6:25 PM  Additional Follow-up for Phone Call Additional follow up Details #3:: Details for Additional Follow-up Action Taken: dr Elsworth Soho is this msg done or is there something more we need to do?  pls advise   Maryann Conners [facility]  May 02, 2010 4:15 PM   [facility] [facility] to send PSG from 2004  to [facility] see if they will dispense supplies Additional Follow-up by: Leanna Sato. Elsworth Soho MD,   May 03, 2010 12:55 PM   Appended Document: sleep study/ bipap supplies-LMTCB x 1 pl fax PSG (new) to [facility]

## 2010-10-25 NOTE — Miscellaneous (Signed)
Summary: Device change out  Clinical Lists Changes  Observations: Added new observation of PPM DOI: 08/26/2010 (08/31/2010 8:24) Added new observation of PPM SERL#: [id number]  (08/31/2010 8:24) Added new observation of PPM MODL#: [id number]  (08/31/2010 8:24) Added new observation of PPMEXPLCOMM: 08/26/2010 St. Jude [id number] explanted  (08/31/2010 8:24)      PPM Specifications Following MD:  Cristopher Peru, MD     PPM Vendor:  St Jude     PPM Model Number:  [id number]     PPM Serial Number:  [id number] PPM DOI:  08/26/2010     PPM Implanting MD:  Cristopher Peru, MD  Lead 1    Location: RV     DOI: 08/05/1999     Model #: [id number]     Serial #: [id number]     Status: active  Magnet Response Rate:  BOL 98.6 ERI 86.3  Indications:  A-fib A-V node ablation  Explantation Comments:  08/26/2010 St. Jude [id number] explanted  PPM Follow Up Pacer Dependent:  Yes      Episodes Coumadin:  No  Parameters Mode:  VVIR     Lower Rate Limit:  90     Upper Rate Limit:  120

## 2010-10-25 NOTE — Cardiovascular Report (Signed)
Summary: TTM   TTM   Imported By: Sallee Provencal 07/26/2010 15:20:40  _____________________________________________________________________  External Attachment:    Type:   Image     Comment:   External Document

## 2010-10-27 NOTE — Cardiovascular Report (Signed)
Summary: Office Visit   Office Visit   Imported By: Sallee Provencal 09/23/2010 16:18:55  _____________________________________________________________________  External Attachment:    Type:   Image     Comment:   External Document

## 2010-10-27 NOTE — Procedures (Signed)
Summary: Cardiology Device Clinic   Current Medications (verified): 1)  Hydrocodone-Acetaminophen 10-500 Mg Tabs (Hydrocodone-Acetaminophen) .... Take 1 Tablet By Mouth Three Times A Day 2)  Klor-Con 10 10 Meq Cr-Tabs (Potassium Chloride) .... Take 1 Tablet By Mouth Three Times A Day 3)  Lisinopril-Hydrochlorothiazide 10-12.5 Mg Tabs (Lisinopril-Hydrochlorothiazide) .... Take 1/2 Tablet By Mouth Once A Day 4)  Allopurinol 300 Mg Tabs (Allopurinol) .... Take 1 Tablet By Mouth Once A Day 5)  Colchicine-Probenecid 0.5-500 Mg Tabs (Colchicine-Probenecid) .... Take 1 Tablet By Mouth Two Times A Day 6)  Furosemide 40 Mg Tabs (Furosemide) .... As Needed 7)  Cod Liver Oil  Caps ([place]) .... Take 1 Tablet By Mouth Once A Day 8)  B-12 .... Take 1 Tablet By Mouth Once A Day 9)  Multivitamins   Tabs (Multiple Vitamin) .... Take 1 Tablet By Mouth Once A Day 10)  Aspirin 325 Mg  Tabs (Aspirin) .... Take 1 Tablet By Mouth Once A Day 11)  Auto Bipap 19/12 (Avg Pr) .... [facility] 12)  Omeprazole 20 Mg Cpdr (Omeprazole) .... Take 1 Tablet By Mouth Once A Day  Allergies (verified): 1)  ! Pcn 2)  ! Cipro 3)  ! Codeine 4)  ! Beta Blockers 5)  ! Prednisone 6)  ! * Peppermint 7)  ! * Latex  PPM Specifications Following MD:  Cristopher Peru, MD     PPM Vendor:  St Jude     PPM Model Number:  [id number]     PPM Serial Number:  [id number] PPM DOI:  08/26/2010     PPM Implanting MD:  Cristopher Peru, MD  Lead 1    Location: RV     DOI: 08/05/1999     Model #: [id number]     Serial #: [id number]     Status: active  Magnet Response Rate:  BOL 98.6 ERI 86.3  Indications:  A-fib A-V node ablation  Explantation Comments:  08/26/2010 St. Jude [id number] explanted  PPM Follow Up Remote Check?  No Battery Voltage:  3.02 V     Battery Est. Longevity:  10.9 years     Pacer Dependent:  Yes     Right Ventricle  Impedance: 610 ohms, Threshold: 0.875 V at 0.4 msec  Episodes Coumadin:  No Ventricular Pacing:   100%  Parameters Mode:  VVIR     Lower Rate Limit:  90     Upper Rate Limit:  120 Tech Comments:  Steri strips removed by the patient, no redness or edema noted. No parameter changes.  Device function normal.   ROV 3 months with Dr. Lovena Le in [place]. Alma Friendly, LPN  September 09, 353 1:18 PM

## 2010-11-16 ENCOUNTER — Encounter: Payer: Self-pay | Admitting: Internal Medicine

## 2010-11-16 ENCOUNTER — Encounter (INDEPENDENT_AMBULATORY_CARE_PROVIDER_SITE_OTHER): Payer: No Typology Code available for payment source | Admitting: Internal Medicine

## 2010-11-16 DIAGNOSIS — I5032 Chronic diastolic (congestive) heart failure: Secondary | ICD-10-CM

## 2010-11-16 DIAGNOSIS — I442 Atrioventricular block, complete: Secondary | ICD-10-CM

## 2010-11-16 DIAGNOSIS — I4891 Unspecified atrial fibrillation: Secondary | ICD-10-CM

## 2010-11-17 ENCOUNTER — Encounter: Payer: Self-pay | Admitting: Internal Medicine

## 2010-11-22 NOTE — Assessment & Plan Note (Signed)
Summary: 2 mth f/u post hosp per pt phone call/tg/hm   Visit Type:  Follow-up Referring Provider:  self Primary Provider:  Dr. [name], [facility] (ENT)  CC:  no cardiology complaints.  History of Present Illness: [name] returns for followup.  She is a pleasant 65 yo woman with CHB, and permanent atrial fibrillation.  She is morbidly obese.  She has not had any worsening dyspnea or c/p since I last saw her. She notes that her blood pressure has been well controlled at home. She is s/p PPM incision.She denies c/p.  Current Medications (verified): 1)  Hydrocodone-Acetaminophen 10-500 Mg Tabs (Hydrocodone-Acetaminophen) .... Take 1 Tablet By Mouth Three Times A Day 2)  Klor-Con 10 10 Meq Cr-Tabs (Potassium Chloride) .... Take 1 Tablet By Mouth Three Times A Day 3)  Lisinopril-Hydrochlorothiazide 10-12.5 Mg Tabs (Lisinopril-Hydrochlorothiazide) .... Take 1/2 Tablet By Mouth Once A Day 4)  Allopurinol 300 Mg Tabs (Allopurinol) .... Take 1 Tablet By Mouth Once A Day 5)  Colchicine-Probenecid 0.5-500 Mg Tabs (Colchicine-Probenecid) .... Take 1 Tablet By Mouth Two Times A Day 6)  Furosemide 40 Mg Tabs (Furosemide) .... As Needed 7)  Cod Liver Oil  Caps ([place]) .... Take 1 Tablet By Mouth Once A Day 8)  B-12 .... Take 1 Tablet By Mouth Once A Day 9)  Multivitamins   Tabs (Multiple Vitamin) .... Take 1 Tablet By Mouth Once A Day 10)  Aspirin 325 Mg  Tabs (Aspirin) .... Take 1 Tablet By Mouth Once A Day 11)  Auto Bipap 19/12 (Avg Pr) .... [facility] 12)  Omeprazole 20 Mg Cpdr (Omeprazole) .... Take 1 Tablet By Mouth Once A Day  Allergies (verified): 1)  ! Pcn 2)  ! Cipro 3)  ! Codeine 4)  ! Beta Blockers 5)  ! Prednisone 6)  ! * Peppermint 7)  ! * Latex  Comments:  Nurse/Medical Assistant: no meds no list patient states all meds are the same [facility]  Past History:  Past Medical History: Last updated: 05/04/2009 Allergic Rhinitis Atrial  Fibrillation Hypertension Cervical cancer  Past Surgical History: Last updated: 05/04/2009 Hysterectomy 1991 Cataract Extraction bilateral Cardiac Pacemaker Aug 05, 1999 Dr. Cristopher Peru  Review of Systems       The patient complains of dyspnea on exertion.  The patient denies chest pain, syncope, and peripheral edema.    Vital Signs:  Patient profile:   65 year old female Weight:      353 pounds BMI:     57.18 Pulse rate:   100 / minute BP sitting:   145 / 89  (left arm)  Vitals Entered By: Doretha Sou, CNA (November 16, 2010 10:44 AM)  Physical Exam  General:  Obese, well developed, well nourished, in no acute distress.  HEENT: normal Neck: supple. No JVD. Carotids 2+ bilaterally no bruits Cor: RRR no rubs, gallops or murmur Lungs: CTA. Well healed PPM incision. Very small piece of suture on the lateral aspect of her incision. Ab: soft, nontender. nondistended. No HSM. Good bowel sounds Ext: warm. no cyanosis, clubbing or edema Neuro: alert and oriented. Grossly nonfocal. affect pleasant    PPM Specifications Following MD:  Cristopher Peru, MD     PPM Vendor:  St Jude     PPM Model Number:  [id number]     PPM Serial Number:  [id number] PPM DOI:  08/26/2010     PPM Implanting MD:  Cristopher Peru, MD  Lead 1    Location: RV  DOI: 08/05/1999     Model #: [id number]     Serial #: [id number]     Status: active  Magnet Response Rate:  BOL 98.6 ERI 86.3  Indications:  A-fib A-V node ablation  Explantation Comments:  08/26/2010 St. Jude [id number] explanted  PPM Follow Up Pacer Dependent:  Yes      Episodes Coumadin:  No  Parameters Mode:  VVIR     Lower Rate Limit:  90     Upper Rate Limit:  120 MD Comments:  Normal device function.  Impression & Recommendations:  Problem # 1:  PACEMAKER, PERMANENT (ICD-V45.01) Her device is working normally. Will recheck in 9 months.  Problem # 2:  HYPERTENSION (ICD-401.9) Her blood pressure is a bit elevated today. She will try to  lose weight, maintain a low sodium diet and continue her current meds. Her updated medication list for this problem includes:    Lisinopril-hydrochlorothiazide 10-12.5 Mg Tabs (Lisinopril-hydrochlorothiazide) .Marland Kitchen... Take 1/2 tablet by mouth once a day    Furosemide 40 Mg Tabs (Furosemide) .Marland Kitchen... As needed    Aspirin 325 Mg Tabs (Aspirin) .Marland Kitchen... Take 1 tablet by mouth once a day  Patient Instructions: 1)  Your physician recommends that you schedule a follow-up appointment in: December 2)  Your physician recommends that you continue on your current medications as directed. Please refer to the Current Medication list given to you today.

## 2010-11-26 ENCOUNTER — Emergency Department (HOSPITAL_COMMUNITY): Payer: Medicare Other

## 2010-11-26 ENCOUNTER — Inpatient Hospital Stay (HOSPITAL_COMMUNITY)
Admission: EM | Admit: 2010-11-26 | Discharge: 2010-11-27 | Disposition: A | Discharge status: Other Acute Inpatient Hospital | Payer: Medicare Other | Source: Home / Self Care

## 2010-11-26 DIAGNOSIS — Z950 Presence of cardiac pacemaker: Secondary | ICD-10-CM

## 2010-11-26 DIAGNOSIS — E872 Acidosis, unspecified: Secondary | ICD-10-CM | POA: Diagnosis not present

## 2010-11-26 DIAGNOSIS — I10 Essential (primary) hypertension: Secondary | ICD-10-CM | POA: Diagnosis present

## 2010-11-26 DIAGNOSIS — N200 Calculus of kidney: Secondary | ICD-10-CM | POA: Diagnosis present

## 2010-11-26 DIAGNOSIS — E876 Hypokalemia: Secondary | ICD-10-CM | POA: Diagnosis not present

## 2010-11-26 DIAGNOSIS — Z8542 Personal history of malignant neoplasm of other parts of uterus: Secondary | ICD-10-CM

## 2010-11-26 DIAGNOSIS — J9600 Acute respiratory failure, unspecified whether with hypoxia or hypercapnia: Secondary | ICD-10-CM | POA: Diagnosis not present

## 2010-11-26 DIAGNOSIS — N201 Calculus of ureter: Secondary | ICD-10-CM | POA: Diagnosis present

## 2010-11-26 DIAGNOSIS — Z23 Encounter for immunization: Secondary | ICD-10-CM

## 2010-11-26 DIAGNOSIS — Z880 Allergy status to penicillin: Secondary | ICD-10-CM

## 2010-11-26 DIAGNOSIS — D696 Thrombocytopenia, unspecified: Secondary | ICD-10-CM | POA: Diagnosis present

## 2010-11-26 DIAGNOSIS — Z91040 Latex allergy status: Secondary | ICD-10-CM

## 2010-11-26 DIAGNOSIS — R6521 Severe sepsis with septic shock: Secondary | ICD-10-CM | POA: Diagnosis present

## 2010-11-26 DIAGNOSIS — I509 Heart failure, unspecified: Secondary | ICD-10-CM | POA: Diagnosis present

## 2010-11-26 DIAGNOSIS — N1330 Unspecified hydronephrosis: Secondary | ICD-10-CM | POA: Diagnosis present

## 2010-11-26 DIAGNOSIS — L02419 Cutaneous abscess of limb, unspecified: Secondary | ICD-10-CM | POA: Diagnosis present

## 2010-11-26 DIAGNOSIS — E1169 Type 2 diabetes mellitus with other specified complication: Secondary | ICD-10-CM | POA: Diagnosis present

## 2010-11-26 DIAGNOSIS — N179 Acute kidney failure, unspecified: Secondary | ICD-10-CM | POA: Diagnosis present

## 2010-11-26 DIAGNOSIS — A419 Sepsis, unspecified organism: Principal | ICD-10-CM | POA: Diagnosis present

## 2010-11-26 DIAGNOSIS — N10 Acute tubulo-interstitial nephritis: Secondary | ICD-10-CM | POA: Diagnosis present

## 2010-11-26 DIAGNOSIS — G4733 Obstructive sleep apnea (adult) (pediatric): Secondary | ICD-10-CM | POA: Diagnosis present

## 2010-11-26 DIAGNOSIS — I5043 Acute on chronic combined systolic (congestive) and diastolic (congestive) heart failure: Secondary | ICD-10-CM | POA: Diagnosis present

## 2010-11-26 DIAGNOSIS — N170 Acute kidney failure with tubular necrosis: Secondary | ICD-10-CM | POA: Diagnosis not present

## 2010-11-26 DIAGNOSIS — Z6841 Body Mass Index (BMI) 40.0 and over, adult: Secondary | ICD-10-CM

## 2010-11-26 DIAGNOSIS — D649 Anemia, unspecified: Secondary | ICD-10-CM | POA: Diagnosis present

## 2010-11-26 DIAGNOSIS — Z7982 Long term (current) use of aspirin: Secondary | ICD-10-CM

## 2010-11-26 DIAGNOSIS — R5381 Other malaise: Secondary | ICD-10-CM | POA: Diagnosis present

## 2010-11-26 DIAGNOSIS — L8992 Pressure ulcer of unspecified site, stage 2: Secondary | ICD-10-CM | POA: Diagnosis present

## 2010-11-26 DIAGNOSIS — I9589 Other hypotension: Secondary | ICD-10-CM | POA: Diagnosis not present

## 2010-11-26 DIAGNOSIS — I2789 Other specified pulmonary heart diseases: Secondary | ICD-10-CM | POA: Diagnosis present

## 2010-11-26 DIAGNOSIS — L89109 Pressure ulcer of unspecified part of back, unspecified stage: Secondary | ICD-10-CM | POA: Diagnosis present

## 2010-11-26 DIAGNOSIS — M109 Gout, unspecified: Secondary | ICD-10-CM | POA: Diagnosis present

## 2010-11-26 DIAGNOSIS — I4891 Unspecified atrial fibrillation: Secondary | ICD-10-CM | POA: Diagnosis present

## 2010-11-26 DIAGNOSIS — Z794 Long term (current) use of insulin: Secondary | ICD-10-CM

## 2010-11-26 DIAGNOSIS — Z87891 Personal history of nicotine dependence: Secondary | ICD-10-CM

## 2010-11-26 DIAGNOSIS — Z881 Allergy status to other antibiotic agents status: Secondary | ICD-10-CM

## 2010-11-26 LAB — CARDIAC PANEL(CRET KIN+CKTOT+MB+TROPI)
CK, MB: 1.7 ng/mL (ref 0.3–4.0)
CK, MB: 2.2 ng/mL (ref 0.3–4.0)
Relative Index: 1.6 (ref 0.0–2.5)
Relative Index: 1.4 (ref 0.0–2.5)
Total CK: 107 U/L (ref 7–177)
Troponin I: 0.04 ng/mL (ref 0.00–0.06)

## 2010-11-26 LAB — DIFFERENTIAL
Basophils Absolute: 0 10*3/uL (ref 0.0–0.1)
Eosinophils Absolute: 0.1 10*3/uL (ref 0.0–0.7)
Eosinophils Relative: 2 % (ref 0–5)
Lymphocytes Relative: 23 % (ref 12–46)
Lymphs Abs: 1.5 10*3/uL (ref 0.7–4.0)
Monocytes Absolute: 0.2 10*3/uL (ref 0.1–1.0)
Monocytes Relative: 3 % (ref 3–12)
Neutrophils Relative %: 72 % (ref 43–77)

## 2010-11-26 LAB — CBC
HCT: 40.5 % (ref 36.0–46.0)
MCV: 95.3 fL (ref 78.0–100.0)
Platelets: 135 10*3/uL — ABNORMAL LOW (ref 150–400)
RBC: 4.25 MIL/uL (ref 3.87–5.11)
RDW: 14.2 % (ref 11.5–15.5)

## 2010-11-26 LAB — BASIC METABOLIC PANEL
CO2: 25 mEq/L (ref 19–32)
Calcium: 9.5 mg/dL (ref 8.4–10.5)
Creatinine, Ser: 1.32 mg/dL — ABNORMAL HIGH (ref 0.4–1.2)
Sodium: 138 mEq/L (ref 135–145)

## 2010-11-26 LAB — URINE MICROSCOPIC-ADD ON

## 2010-11-26 LAB — URINALYSIS, ROUTINE W REFLEX MICROSCOPIC
Bilirubin Urine: NEGATIVE
Ketones, ur: NEGATIVE mg/dL
Leukocytes, UA: NEGATIVE
Specific Gravity, Urine: >1.03 — ABNORMAL HIGH (ref 1.005–1.030)

## 2010-11-26 LAB — POCT CARDIAC MARKERS
CKMB, poc: <1 ng/mL — ABNORMAL LOW (ref 1.0–8.0)
Myoglobin, poc: 265 ng/mL (ref 12–200)
Troponin i, poc: <0.05 ng/mL (ref 0.00–0.09)

## 2010-11-26 LAB — MRSA PCR SCREENING: MRSA by PCR: NEGATIVE

## 2010-11-26 LAB — BRAIN NATRIURETIC PEPTIDE: Pro B Natriuretic peptide (BNP): 193 pg/mL — ABNORMAL HIGH (ref 0.0–100.0)

## 2010-11-26 LAB — GLUCOSE, CAPILLARY
Glucose-Capillary: 142 mg/dL — ABNORMAL HIGH (ref 70–99)
Glucose-Capillary: 154 mg/dL — ABNORMAL HIGH (ref 70–99)

## 2010-11-27 ENCOUNTER — Inpatient Hospital Stay (HOSPITAL_COMMUNITY): Payer: Medicare Other

## 2010-11-27 ENCOUNTER — Inpatient Hospital Stay (HOSPITAL_COMMUNITY)
Admission: RE | Admit: 2010-11-27 | Discharge: 2010-12-09 | DRG: 853 | Disposition: A | Discharge status: Skilled Nursing Facility | Payer: Medicare Other | Source: Other Acute Inpatient Hospital | Attending: Internal Medicine | Admitting: Internal Medicine

## 2010-11-27 DIAGNOSIS — J9600 Acute respiratory failure, unspecified whether with hypoxia or hypercapnia: Secondary | ICD-10-CM

## 2010-11-27 DIAGNOSIS — N136 Pyonephrosis: Secondary | ICD-10-CM

## 2010-11-27 DIAGNOSIS — R579 Shock, unspecified: Secondary | ICD-10-CM

## 2010-11-27 DIAGNOSIS — N172 Acute kidney failure with medullary necrosis: Secondary | ICD-10-CM

## 2010-11-27 LAB — POCT I-STAT 3, ART BLOOD GAS (G3+)
Acid-base deficit: 8 mmol/L — ABNORMAL HIGH (ref 0.0–2.0)
Bicarbonate: 20.5 mEq/L (ref 20.0–24.0)
Bicarbonate: 16 mEq/L — ABNORMAL LOW (ref 20.0–24.0)
Bicarbonate: 13.5 mEq/L — ABNORMAL LOW (ref 20.0–24.0)
O2 Saturation: 91 %
Patient temperature: 103.2
Patient temperature: 101.5
Patient temperature: 103.2
TCO2: 22 mmol/L (ref 0–100)
pH, Arterial: 7.153 — CL (ref 7.350–7.400)
pH, Arterial: 7.158 — CL (ref 7.350–7.400)
pH, Arterial: 7.175 — CL (ref 7.350–7.400)
pO2, Arterial: 71 mmHg — ABNORMAL LOW (ref 80.0–100.0)

## 2010-11-27 LAB — COMPREHENSIVE METABOLIC PANEL
ALT: 31 U/L (ref 0–35)
AST: 39 U/L — ABNORMAL HIGH (ref 0–37)
CO2: 19 mEq/L (ref 19–32)
Calcium: 8.1 mg/dL — ABNORMAL LOW (ref 8.4–10.5)
GFR calc Af Amer: 17 mL/min — ABNORMAL LOW (ref >60)
GFR calc non Af Amer: 14 mL/min — ABNORMAL LOW (ref >60)
Sodium: 136 mEq/L (ref 135–145)

## 2010-11-27 LAB — GLUCOSE, CAPILLARY
Glucose-Capillary: 114 mg/dL — ABNORMAL HIGH (ref 70–99)
Glucose-Capillary: 89 mg/dL (ref 70–99)

## 2010-11-27 LAB — CBC
HCT: 40 % (ref 36.0–46.0)
HCT: 44.2 % (ref 36.0–46.0)
Hemoglobin: 14.4 g/dL (ref 12.0–15.0)
MCH: 31.4 pg (ref 26.0–34.0)
MCH: 31.2 pg (ref 26.0–34.0)
MCHC: 32.8 g/dL (ref 30.0–36.0)
MCV: 95.9 fL (ref 78.0–100.0)
MCV: 95.7 fL (ref 78.0–100.0)
Platelets: 108 10*3/uL — ABNORMAL LOW (ref 150–400)
RBC: 4.62 MIL/uL (ref 3.87–5.11)
RDW: 14.8 % (ref 11.5–15.5)
WBC: 11.2 10*3/uL — ABNORMAL HIGH (ref 4.0–10.5)

## 2010-11-27 LAB — BASIC METABOLIC PANEL
BUN: 31 mg/dL — ABNORMAL HIGH (ref 6–23)
CO2: 15 mEq/L — ABNORMAL LOW (ref 19–32)
Calcium: 8 mg/dL — ABNORMAL LOW (ref 8.4–10.5)
Chloride: 112 mEq/L (ref 96–112)
Creatinine, Ser: 2.29 mg/dL — ABNORMAL HIGH (ref 0.4–1.2)
GFR calc Af Amer: 16 mL/min — ABNORMAL LOW (ref >60)
GFR calc non Af Amer: 21 mL/min — ABNORMAL LOW (ref >60)
Glucose, Bld: 129 mg/dL — ABNORMAL HIGH (ref 70–99)
Potassium: 4.5 mEq/L (ref 3.5–5.1)
Sodium: 139 mEq/L (ref 135–145)

## 2010-11-27 LAB — CARDIAC PANEL(CRET KIN+CKTOT+MB+TROPI)
CK, MB: 1.5 ng/mL (ref 0.3–4.0)
CK, MB: 8.3 ng/mL (ref 0.3–4.0)
Relative Index: 1 (ref 0.0–2.5)
Relative Index: 2.2 (ref 0.0–2.5)
Total CK: 148 U/L (ref 7–177)
Troponin I: 0.04 ng/mL (ref 0.00–0.06)

## 2010-11-27 LAB — TYPE AND SCREEN: Antibody Screen: NEGATIVE

## 2010-11-27 LAB — DIFFERENTIAL
Basophils Relative: 1 % (ref 0–1)
Lymphocytes Relative: 22 % (ref 12–46)
Monocytes Relative: 1 % — ABNORMAL LOW (ref 3–12)
Neutro Abs: 1.4 10*3/uL — ABNORMAL LOW (ref 1.7–7.7)

## 2010-11-27 LAB — CARBOXYHEMOGLOBIN
Carboxyhemoglobin: 1.1 % (ref 0.5–1.5)
Methemoglobin: 0.5 % (ref 0.0–1.5)

## 2010-11-27 LAB — ABO/RH: ABO/RH(D): O POS

## 2010-11-27 LAB — URINALYSIS, ROUTINE W REFLEX MICROSCOPIC
Bilirubin Urine: NEGATIVE
Nitrite: NEGATIVE
Protein, ur: 100 mg/dL — AB
Specific Gravity, Urine: 1.02 (ref 1.005–1.030)
Urobilinogen, UA: 1 mg/dL (ref 0.0–1.0)

## 2010-11-27 LAB — HEMOGLOBIN A1C: Mean Plasma Glucose: 117 mg/dL — ABNORMAL HIGH (ref <117)

## 2010-11-27 LAB — LACTIC ACID, PLASMA
Lactic Acid, Venous: 2.2 mmol/L (ref 0.5–2.2)
Lactic Acid, Venous: 9 mmol/L — ABNORMAL HIGH (ref 0.5–2.2)

## 2010-11-27 LAB — PROTIME-INR
INR: 1.38 (ref 0.00–1.49)
Prothrombin Time: 16 seconds — ABNORMAL HIGH (ref 11.6–15.2)
Prothrombin Time: 17.2 seconds — ABNORMAL HIGH (ref 11.6–15.2)

## 2010-11-27 LAB — SURGICAL PCR SCREEN: Staphylococcus aureus: NEGATIVE

## 2010-11-27 LAB — PROCALCITONIN: Procalcitonin: 57.01 ng/mL

## 2010-11-27 LAB — BRAIN NATRIURETIC PEPTIDE: Pro B Natriuretic peptide (BNP): 284 pg/mL — ABNORMAL HIGH (ref 0.0–100.0)

## 2010-11-27 MED ORDER — IOHEXOL 300 MG/ML  SOLN
50.0000 mL | Freq: Once | INTRAMUSCULAR | Status: AC | PRN
  Administered 2010-11-27: 40 mL via INTRAVENOUS

## 2010-11-27 NOTE — H&P (Addendum)
NAMEBRIGITTA, [name]              ACCOUNT NO.:  [id number]  MEDICAL RECORD NO.:  [id number]           PATIENT TYPE:  I  LOCATION:  IC12                          FACILITY:  [facility]  PHYSICIAN:  [name] Hummer, MD    DATE OF BIRTH:  [birth year]  DATE OF ADMISSION:  11/26/2010 DATE OF DISCHARGE:  LH                             HISTORY & PHYSICAL   CHIEF COMPLAINT:  Left flank pain and back pain.  HISTORY OF PRESENT ILLNESS:  This is a very pleasant 65 year old with past medical history significant for systolic and diastolic heart failure, ejection fraction 30-35%, AFib post atrioventricular nodal ablation with St. Jude permanent pacemaker, morbid obesity, who presents to the emergency department complaining of flank pain, that started the morning of admission at around 4 a.m.  She related that the pain is sharp in quality 10/10 getting worse.  She has some dysuria a couple of days ago.  She has decreased urine output over the last day.  She is having some shortness of breath worse than usual.  She is more than usual since she is in the emergency department since she is having the flank pain.  She denies fever or chest pain.  She was coughing phlegm last week, but that is getting better.  She relates that the shortness of breath is a little better at the time that I was interviewing her. She related that she has this chronic lower extremity edema and erythema.  She feels that the erythema on her left lower extremity is getting worse over the last couple of days.  ALLERGIES:  She has multiple allergies to penicillin, hives; ciprofloxacin, hives; codeine, throat swelling; peppermint, breathing problems; latex, rash.  PAST MEDICAL HISTORY: 1. Systolic and diastolic heart failure, last ejection fraction and 30-     35% on 2-D echo in 2009.  AFib status post nodal ablation, status     post permanent pacemaker. 2. Morbid obesity. 3. Mild pulmonary artery hypertension by right heart  cath. 4. History of gout. 5. Diverticulitis. 6. Chronic cellulitis. 7. Degenerative joint disease. 8. History of cervical cancer status post hysterectomy. 9. Degenerative disk disease. 10.History of sleep apnea.  PAST SURGICAL HISTORY: 1. Treatment of tubal pregnancy. 2. Hysterectomy.  MEDICATIONS: 1. Hydrocodone/Tylenol 10/500 three times a day. 2. Allopurinol 300 mg p.o. daily. 3. Probenecid and colchicine twice a day. 4. Potassium 20 mEq three times a day. 5. Lisinopril hydrochlorothiazide once a day. 6. Omeprazole 20 mg p.o. daily. 7. Furosemide 40 mg p.r.n. 8. Aspirin 325 p.o. daily. 9. B12. 10.Multivitamins.  SOCIAL HISTORY:  She quit smoking more than 30 years ago.  She denies alcohol or recreational drug.  FAMILY HISTORY:  Noncontributory.  PHYSICAL EXAMINATION:  VITALS:  Initial labs temperature 98.5, blood pressure 150/72, heart rate 90, respirations 20, oxygen saturation initially 98 on room air.  Then, the patient desat and her oxygen decreased to 90 on 2 L.  The most recent labs heart rate 90, bloodpressure 123/55, respirations 20, oxygen saturation 91 on 2 L.  She has a respiration up to 36, but this has decreased to 20. GENERAL:  The patient lying  in bed in mild distress due to severe pain. HEENT:  Head, atraumatic and normocephalic.  Eyes, anicteric.  Pupils equally reactive to light.  Extraocular muscles intact. NECK:  Supple.  Difficult to evaluate for JVD due to neck sore. CARDIOVASCULAR:  S1 and S2, regular rhythm and rate.  Distant heart sounds.  No murmur, rubs, or gallops. LUNGS:  Bilateral air movement.  Mild crackles at the bases.  No wheezing or rhonchi. ABDOMEN:  Obese, mild tenderness, generalized, and worsening pain on her left flank. EXTREMITIES:  Pulse present +2 edema.  She has chronic left lower extremity redness, getting worse over the course of days. NEURO EXAM:  Grossly nonfocal.  LABORATORY DATA:  Admission labs, white blood cells  6.5, hemoglobin 13.4, hematocrit 40.5, platelet 135.  Sodium 138, potassium 3.7, chloride 104, bicarb 25, glucose 173, BUN 21, creatinine 1.32, calcium 9.5.  Urinalysis, positive nitrates and white blood cells 3-6, red blood cells 0-2, troponin 0.05, myoglobin 265, BNP mildly elevated at 193.  RADIOGRAPHIC STUDIES:  Abdominal x-ray, multiple left-sided renal stone visible by x-ray.  Chest x-ray, cardiomegaly and mild interstitial edema concerning for congestive heart failure.  Nonfocal airspace disease.  CT of abdomen and pelvis obstructing proximal left UPJ, 7-mm calculus with associated mild hydronephrosis and moderate perinephric inflammation edema.  Additional nonobstructing 14 mL left renal pelvic stone and left lower pole caliceal calcification.  Cholelithiasis.  Stable right lower pole renal cyst.  Stable small umbilical hernia containing fat and small amount of transverse colon.  ASSESSMENT/PLAN: This is a very pleasant 65 year old with history of heart failure, who presented with left-sided flank pain. 1. Congestive heart failure exacerbation, mild exacerbation.   IV     normal saline lock.  The patient received Lasix 40 mg IV x1.  She     had 200 mL urine output.  I will repeat a extra dose of Lasix if     blood pressure allows it.  I will check cardiac enzymes x3 eight     hours apart.  Daily weight, Is and Os.  I will check a 2-D echo.     BNP mildly elevated.  We will repeat BNP in the morning.  We will     admit the patient to a Stepdown Unit.  We will need to stabilize     her heart failure prior to any intervention.  The patient's oxygen     saturation has improved to 90 with 2 L. 2. Obstructing proximal left UPJ 7-mm calculus with associated mild     hydronephrosis, moderate inflammation and edema.  Urology     consulted.  Pain medications.  No IV fluid due to history of heart     failure.  We will need stabilization of heart failure before     proceeding to any  intervention. afebril, no leukocytosis.  3. Right lower extremity cellulitis, worsening acute on chronic     redness.  I will start vancomycin per pharmacy.  I will check blood     cultures. 4. For deep venous thrombosis prophylaxis, no SCD due to bilateral     lower extremity erythema and possible cellulitis.  We will start     anticoagulation after Urology procedure takes place. 5. Increasing creatinine.  We will monitor for now.  No IV fluid due     to heart failure.     [name] Hummer, MD     BR/MEDQ  D:  11/26/2010  T:  11/26/2010  Job:  [id number]  Electronically  Signed by [name] Hummer MD on 11/27/2010 10:24:54 AM

## 2010-11-28 ENCOUNTER — Inpatient Hospital Stay (HOSPITAL_COMMUNITY): Payer: Medicare Other

## 2010-11-28 DIAGNOSIS — R579 Shock, unspecified: Secondary | ICD-10-CM

## 2010-11-28 DIAGNOSIS — N172 Acute kidney failure with medullary necrosis: Secondary | ICD-10-CM

## 2010-11-28 DIAGNOSIS — J9600 Acute respiratory failure, unspecified whether with hypoxia or hypercapnia: Secondary | ICD-10-CM

## 2010-11-28 DIAGNOSIS — N136 Pyonephrosis: Secondary | ICD-10-CM

## 2010-11-28 LAB — GLUCOSE, CAPILLARY
Glucose-Capillary: 64 mg/dL — ABNORMAL LOW (ref 70–99)
Glucose-Capillary: 91 mg/dL (ref 70–99)
Glucose-Capillary: 121 mg/dL — ABNORMAL HIGH (ref 70–99)
Glucose-Capillary: 157 mg/dL — ABNORMAL HIGH (ref 70–99)
Glucose-Capillary: 189 mg/dL — ABNORMAL HIGH (ref 70–99)
Glucose-Capillary: 207 mg/dL — ABNORMAL HIGH (ref 70–99)
Glucose-Capillary: 195 mg/dL — ABNORMAL HIGH (ref 70–99)

## 2010-11-28 LAB — CBC
HCT: 40.4 % (ref 36.0–46.0)
MCV: 94.8 fL (ref 78.0–100.0)
RBC: 4.26 MIL/uL (ref 3.87–5.11)
WBC: 13.2 10*3/uL — ABNORMAL HIGH (ref 4.0–10.5)

## 2010-11-28 LAB — POCT I-STAT 3, ART BLOOD GAS (G3+)
Acid-base deficit: 9 mmol/L — ABNORMAL HIGH (ref 0.0–2.0)
Bicarbonate: 14.7 mEq/L — ABNORMAL LOW (ref 20.0–24.0)
Bicarbonate: 15.9 mEq/L — ABNORMAL LOW (ref 20.0–24.0)
Bicarbonate: 15.4 mEq/L — ABNORMAL LOW (ref 20.0–24.0)
Patient temperature: 100.9
TCO2: 17 mmol/L (ref 0–100)
pCO2 arterial: 30.1 mmHg — ABNORMAL LOW (ref 35.0–45.0)
pCO2 arterial: 31.2 mmHg — ABNORMAL LOW (ref 35.0–45.0)
pH, Arterial: 7.292 — ABNORMAL LOW (ref 7.350–7.400)
pH, Arterial: 7.341 — ABNORMAL LOW (ref 7.350–7.400)
pO2, Arterial: 273 mmHg — ABNORMAL HIGH (ref 80.0–100.0)
pO2, Arterial: 83 mmHg (ref 80.0–100.0)

## 2010-11-28 LAB — BASIC METABOLIC PANEL
BUN: 45 mg/dL — ABNORMAL HIGH (ref 6–23)
Chloride: 109 mEq/L (ref 96–112)
Glucose, Bld: 103 mg/dL — ABNORMAL HIGH (ref 70–99)
Potassium: 3.6 mEq/L (ref 3.5–5.1)
Sodium: 139 mEq/L (ref 135–145)

## 2010-11-28 LAB — CORTISOL: Cortisol, Plasma: 17 ug/dL

## 2010-11-28 LAB — VANCOMYCIN, RANDOM: Vancomycin Rm: 26.3 ug/mL

## 2010-11-28 LAB — LEGIONELLA ANTIGEN, URINE

## 2010-11-28 LAB — MAGNESIUM: Magnesium: 1.2 mg/dL — ABNORMAL LOW (ref 1.5–2.5)

## 2010-11-29 ENCOUNTER — Inpatient Hospital Stay (HOSPITAL_COMMUNITY): Payer: Medicare Other

## 2010-11-29 LAB — GLUCOSE, CAPILLARY
Glucose-Capillary: 184 mg/dL — ABNORMAL HIGH (ref 70–99)
Glucose-Capillary: 189 mg/dL — ABNORMAL HIGH (ref 70–99)
Glucose-Capillary: 197 mg/dL — ABNORMAL HIGH (ref 70–99)
Glucose-Capillary: 235 mg/dL — ABNORMAL HIGH (ref 70–99)

## 2010-11-29 LAB — CULTURE, BLOOD (ROUTINE X 2): Culture  Setup Time: 201203042124

## 2010-11-29 LAB — URINE CULTURE
Colony Count: >=100000
Colony Count: NO GROWTH
Culture  Setup Time: 201203032016
Culture: NO GROWTH

## 2010-11-29 LAB — COMPREHENSIVE METABOLIC PANEL
Albumin: 2.4 g/dL — ABNORMAL LOW (ref 3.5–5.2)
Alkaline Phosphatase: 67 U/L (ref 39–117)
BUN: 55 mg/dL — ABNORMAL HIGH (ref 6–23)
Chloride: 100 mEq/L (ref 96–112)
Creatinine, Ser: 5.39 mg/dL — ABNORMAL HIGH (ref 0.4–1.2)
GFR calc non Af Amer: 8 mL/min — ABNORMAL LOW (ref >60)
Glucose, Bld: 205 mg/dL — ABNORMAL HIGH (ref 70–99)
Potassium: 4.5 mEq/L (ref 3.5–5.1)
Total Bilirubin: 2.7 mg/dL — ABNORMAL HIGH (ref 0.3–1.2)

## 2010-11-29 LAB — BLOOD GAS, ARTERIAL
Acid-base deficit: 3.5 mmol/L — ABNORMAL HIGH (ref 0.0–2.0)
Bicarbonate: 19.8 mEq/L — ABNORMAL LOW (ref 20.0–24.0)
FIO2: 0.4 %
O2 Saturation: 94.7 %
Patient temperature: 102.2
TCO2: 20.7 mmol/L (ref 0–100)
pO2, Arterial: 79.6 mmHg — ABNORMAL LOW (ref 80.0–100.0)

## 2010-11-29 LAB — CBC
HCT: 35.4 % — ABNORMAL LOW (ref 36.0–46.0)
MCH: 30.6 pg (ref 26.0–34.0)
MCV: 90.3 fL (ref 78.0–100.0)
Platelets: 57 10*3/uL — ABNORMAL LOW (ref 150–400)
RBC: 3.92 MIL/uL (ref 3.87–5.11)
WBC: 15.7 10*3/uL — ABNORMAL HIGH (ref 4.0–10.5)

## 2010-11-29 LAB — CULTURE, BAL-QUANTITATIVE W GRAM STAIN

## 2010-11-30 ENCOUNTER — Inpatient Hospital Stay (HOSPITAL_COMMUNITY): Payer: Medicare Other

## 2010-11-30 LAB — GLUCOSE, CAPILLARY
Glucose-Capillary: 194 mg/dL — ABNORMAL HIGH (ref 70–99)
Glucose-Capillary: 170 mg/dL — ABNORMAL HIGH (ref 70–99)
Glucose-Capillary: 163 mg/dL — ABNORMAL HIGH (ref 70–99)

## 2010-11-30 LAB — CBC
HCT: 32.9 % — ABNORMAL LOW (ref 36.0–46.0)
Platelets: 42 10*3/uL — ABNORMAL LOW (ref 150–400)
RBC: 3.65 MIL/uL — ABNORMAL LOW (ref 3.87–5.11)
RDW: 14.8 % (ref 11.5–15.5)
WBC: 7.3 10*3/uL (ref 4.0–10.5)

## 2010-11-30 LAB — BASIC METABOLIC PANEL
BUN: 72 mg/dL — ABNORMAL HIGH (ref 6–23)
Creatinine, Ser: 5.35 mg/dL — ABNORMAL HIGH (ref 0.4–1.2)
GFR calc Af Amer: 10 mL/min — ABNORMAL LOW (ref >60)
GFR calc non Af Amer: 8 mL/min — ABNORMAL LOW (ref >60)
Potassium: 4.5 mEq/L (ref 3.5–5.1)

## 2010-12-01 ENCOUNTER — Inpatient Hospital Stay (HOSPITAL_COMMUNITY): Payer: Medicare Other

## 2010-12-01 LAB — BLOOD GAS, ARTERIAL
Bicarbonate: 25.9 mEq/L — ABNORMAL HIGH (ref 20.0–24.0)
MECHVT: 500 mL
O2 Saturation: 95.4 %
PEEP: 5 cmH2O
Patient temperature: 98.6

## 2010-12-01 LAB — BASIC METABOLIC PANEL
GFR calc Af Amer: 11 mL/min — ABNORMAL LOW (ref >60)
GFR calc non Af Amer: 9 mL/min — ABNORMAL LOW (ref >60)
Potassium: 3.8 mEq/L (ref 3.5–5.1)
Sodium: 137 mEq/L (ref 135–145)

## 2010-12-01 LAB — CBC
Platelets: 38 10*3/uL — ABNORMAL LOW (ref 150–400)
RDW: 14.7 % (ref 11.5–15.5)
WBC: 8.4 10*3/uL (ref 4.0–10.5)

## 2010-12-01 LAB — CULTURE, BLOOD (ROUTINE X 2)

## 2010-12-01 LAB — GLUCOSE, CAPILLARY
Glucose-Capillary: 168 mg/dL — ABNORMAL HIGH (ref 70–99)
Glucose-Capillary: 207 mg/dL — ABNORMAL HIGH (ref 70–99)
Glucose-Capillary: 232 mg/dL — ABNORMAL HIGH (ref 70–99)

## 2010-12-01 LAB — POCT I-STAT 3, ART BLOOD GAS (G3+)
Bicarbonate: 26.1 mEq/L — ABNORMAL HIGH (ref 20.0–24.0)
Patient temperature: 99.2
pCO2 arterial: 31.4 mmHg — ABNORMAL LOW (ref 35.0–45.0)
pH, Arterial: 7.528 — ABNORMAL HIGH (ref 7.350–7.400)
pO2, Arterial: 69 mmHg — ABNORMAL LOW (ref 80.0–100.0)

## 2010-12-02 DIAGNOSIS — A419 Sepsis, unspecified organism: Secondary | ICD-10-CM

## 2010-12-02 DIAGNOSIS — R7881 Bacteremia: Secondary | ICD-10-CM

## 2010-12-02 DIAGNOSIS — R6521 Severe sepsis with septic shock: Secondary | ICD-10-CM

## 2010-12-02 DIAGNOSIS — R6520 Severe sepsis without septic shock: Secondary | ICD-10-CM

## 2010-12-02 DIAGNOSIS — N10 Acute tubulo-interstitial nephritis: Secondary | ICD-10-CM

## 2010-12-02 LAB — CBC
HCT: 32.6 % — ABNORMAL LOW (ref 36.0–46.0)
Hemoglobin: 11 g/dL — ABNORMAL LOW (ref 12.0–15.0)
RBC: 3.61 MIL/uL — ABNORMAL LOW (ref 3.87–5.11)
WBC: 13.6 10*3/uL — ABNORMAL HIGH (ref 4.0–10.5)

## 2010-12-02 LAB — BASIC METABOLIC PANEL
BUN: 95 mg/dL — ABNORMAL HIGH (ref 6–23)
CO2: 29 mEq/L (ref 19–32)
Chloride: 103 mEq/L (ref 96–112)
Creatinine, Ser: 2.81 mg/dL — ABNORMAL HIGH (ref 0.4–1.2)
GFR calc non Af Amer: 14 mL/min — ABNORMAL LOW (ref >60)
Potassium: 2.9 mEq/L — ABNORMAL LOW (ref 3.5–5.1)
Sodium: 142 mEq/L (ref 135–145)

## 2010-12-02 LAB — GLUCOSE, CAPILLARY

## 2010-12-02 LAB — BRAIN NATRIURETIC PEPTIDE: Pro B Natriuretic peptide (BNP): 413 pg/mL — ABNORMAL HIGH (ref 0.0–100.0)

## 2010-12-02 NOTE — Consult Note (Signed)
[name] [name], [name] [name]              ACCOUNT NO.:  [id number]  MEDICAL RECORD NO.:  [id number]           PATIENT TYPE:  I  LOCATION:  2761                         FACILITY:  [facility]  PHYSICIAN:  Bernestine Amass, M.D.  DATE OF BIRTH:  05-19-1946  DATE OF CONSULTATION:  11/27/2010 DATE OF DISCHARGE:                                CONSULTATION   REASON FOR CONSULTATION:  Left proximal UPJ stone with urosepsis.  HISTORY OF PRESENT ILLNESS:  [name] [name] is 65 years of age.  She does have a number of medical comorbidities including left ventricular dysfunction, cardiac arrhythmia issues, morbid obesity, and nephrolithiasis.  She had presented on November 26, 2010, to [facility] complaining of left flank pain.  Her pain was not controlled in the emergency room and she was admitted for pain control as well as medical evaluation.  CT scan revealed a 7-mm obstructing left UPJ stone with at least mild hydronephrosis and additional 14-mm stone in her left renal pelvis.  At that point, the patient was clinically stable.  She was evaluated by the local urologist.  The patient was again admitted for pain control with consideration for double-J stenting early the next week rather than an acute basis.  The patient's clinical situation deteriorated.  She became hypotensive and had a substantial drop in her urinary output.  Critical Care Medicine was contacted at [facility] for consideration of transfer due to urosepsis with an obstructing stone.  That transfer was accepted and we were contacted that the patient would be coming in to [facility] and that acute urologic evaluation was needed.  The patient's creatinine which at the time of admission was 1.3 had increased to 2.3 on the day of transfer. Preliminary blood cultures were positive for Gram-negative rods.  The patient has just arrived at the [facility] emergency room.  She continues to complain of significant left-sided flank and  abdominal discomfort.  She tells me that she has been told that she had nephrolithiasis in the past and went to see a urologist, but decided not to keep her appointment at that time for reasons that are unclear that are related to her feeling that the urologists did not want to take care of her.  As far she knows, she has not had any clinical stone events.  I have reviewed the patient's CT scan which again shows a partially obstructing 7-mm left UPJ stone along with a larger stone in her renal pelvis.  PAST MEDICAL HISTORY: 1. Heart failure with reduced ejection fraction.  The patient does     have a permanent pacemaker. 2. Morbid obesity. 3. Sleep apnea. 4. History of diverticulitis.  SURGICAL HISTORY:  Notable for hysterectomy.  MEDICATIONS:  At the time of admission, the patient's medications included allopurinol, potassium, lisinopril, hydrochlorothiazide, omeprazole, aspirin, vitamins, Lasix.  She is currently on pressor support and broad-spectrum antibiotic therapy.  Upon arrival, she is on dopamine for pressure support.  REVIEW OF SYSTEMS:  Somewhat limited by her acute situation.  She denies any gross hematuria or significant dysuria.  She does have considerable ongoing left flank and abdominal discomfort  along with some nausea.  PHYSICAL EXAMINATION:  GENERAL:  She is a morbidly obese female in moderate distress. VITAL SIGNS:  She is currently afebrile.  Blood pressure is 110/70 on dopamine.  Room air sat is currently 96%.  She is moderately to tachypneic. NECK:  Exam shows no gross masses. LUNGS:  Respiratory rate is somewhat tachypneic and shallow. HEART:  Regular rate and rhythm. ABDOMEN:  Markedly obese.  No palpable masses.  Mild left upper quadrant tenderness and mild left CVA tenderness.  EXTREMITIES:  Show 2+ edema with some chronic venous stasis changes. NEUROLOGIC:  Grossly nonfocal.  DATA:  Imaging studies as described above.  White blood cell  count currently 11.2 thousand with hemoglobin of 13.1.  Creatinine 2.3, up from baseline.  ASSESSMENT:  A 7-mm left ureteropelvic junction stone with larger stone burden in the left renal pelvis.  The patient has clear evidence of urosepsis clinically with hypotension and dropping urinary output and rising creatinine.  I suspect her creatinine elevation is secondary to obstruction, but also potential initiation of ATN due to sepsis and hypotension.  The patient has pulmonary blood cultures positive for gram- negative rods consistent again with urosepsis picture.  We have contacted Interventional Radiology and reviewed the films with Dr. Daryll Brod.  We made the decision to proceed with percutaneous nephrostomy tube drainage of her kidney rather than double-J stent placement for improved drainage and also potentially for subsequent need for potential percutaneous procedure to rid her off her stone burden. She may potentially be a candidate for lithotripsy, but again a definitive review of her stone burden and stone density will help make determination whether she would be better suited with a percutaneous procedure or potential lithotripsy if she does resolve her acute illness, stabilizes, and recovers from this insult.  Additional antibiotic coverage will depend on final sensitivity of urine and blood cultures.  The patient has yet to be evaluated by Critical Care.  They will need stabilize the patient and then we will have Interventional Radiology proceed with definitive drainage of the kidney.     Bernestine Amass, M.D.     DSG/MEDQ  D:  11/30/2010  T:  11/30/2010  Job:  [id number]  cc:   Dr. Maryland Pink  Electronically Signed by Rana Snare M.D. on 12/02/2010 01:50:01 PM

## 2010-12-03 ENCOUNTER — Inpatient Hospital Stay (HOSPITAL_COMMUNITY): Payer: Medicare Other

## 2010-12-03 LAB — CBC
Hemoglobin: 11.2 g/dL — ABNORMAL LOW (ref 12.0–15.0)
MCH: 30.7 pg (ref 26.0–34.0)
RBC: 3.65 MIL/uL — ABNORMAL LOW (ref 3.87–5.11)
WBC: 15.8 10*3/uL — ABNORMAL HIGH (ref 4.0–10.5)

## 2010-12-03 LAB — BASIC METABOLIC PANEL
CO2: 30 mEq/L (ref 19–32)
Chloride: 101 mEq/L (ref 96–112)
GFR calc Af Amer: 28 mL/min — ABNORMAL LOW (ref >60)
Potassium: 3.3 mEq/L — ABNORMAL LOW (ref 3.5–5.1)
Sodium: 140 mEq/L (ref 135–145)

## 2010-12-03 LAB — DIFFERENTIAL
Eosinophils Relative: 0 % (ref 0–5)
Lymphocytes Relative: 13 % (ref 12–46)
Lymphs Abs: 2.1 10*3/uL (ref 0.7–4.0)
Monocytes Relative: 10 % (ref 3–12)

## 2010-12-03 LAB — GLUCOSE, CAPILLARY
Glucose-Capillary: 80 mg/dL (ref 70–99)
Glucose-Capillary: 102 mg/dL — ABNORMAL HIGH (ref 70–99)
Glucose-Capillary: 82 mg/dL (ref 70–99)
Glucose-Capillary: 89 mg/dL (ref 70–99)

## 2010-12-04 ENCOUNTER — Inpatient Hospital Stay (HOSPITAL_COMMUNITY): Payer: Medicare Other

## 2010-12-04 DIAGNOSIS — N179 Acute kidney failure, unspecified: Secondary | ICD-10-CM

## 2010-12-04 DIAGNOSIS — A419 Sepsis, unspecified organism: Secondary | ICD-10-CM

## 2010-12-04 LAB — BASIC METABOLIC PANEL
CO2: 29 mEq/L (ref 19–32)
Calcium: 7.9 mg/dL — ABNORMAL LOW (ref 8.4–10.5)
Creatinine, Ser: 1.47 mg/dL — ABNORMAL HIGH (ref 0.4–1.2)
Glucose, Bld: 87 mg/dL (ref 70–99)

## 2010-12-04 LAB — GLUCOSE, CAPILLARY
Glucose-Capillary: 81 mg/dL (ref 70–99)
Glucose-Capillary: 100 mg/dL — ABNORMAL HIGH (ref 70–99)
Glucose-Capillary: 80 mg/dL (ref 70–99)

## 2010-12-04 LAB — CBC
HCT: 33.6 % — ABNORMAL LOW (ref 36.0–46.0)
MCH: 30.9 pg (ref 26.0–34.0)
MCV: 94.4 fL (ref 78.0–100.0)
Platelets: 130 10*3/uL — ABNORMAL LOW (ref 150–400)
RDW: 14.7 % (ref 11.5–15.5)

## 2010-12-04 LAB — HEPATIC FUNCTION PANEL
ALT: 69 U/L — ABNORMAL HIGH (ref 0–35)
AST: 59 U/L — ABNORMAL HIGH (ref 0–37)
Albumin: 2.2 g/dL — ABNORMAL LOW (ref 3.5–5.2)
Alkaline Phosphatase: 52 U/L (ref 39–117)
Total Bilirubin: 1 mg/dL (ref 0.3–1.2)

## 2010-12-05 ENCOUNTER — Inpatient Hospital Stay (HOSPITAL_COMMUNITY): Payer: Medicare Other

## 2010-12-05 LAB — BASIC METABOLIC PANEL
BUN: 47 mg/dL — ABNORMAL HIGH (ref 6–23)
Chloride: 103 mEq/L (ref 96–112)
Creatinine, Ser: 1.46 mg/dL — ABNORMAL HIGH (ref 0.4–1.2)
GFR calc non Af Amer: 36 mL/min — ABNORMAL LOW (ref >60)
Glucose, Bld: 92 mg/dL (ref 70–99)

## 2010-12-05 LAB — GLUCOSE, CAPILLARY
Glucose-Capillary: 87 mg/dL (ref 70–99)
Glucose-Capillary: 140 mg/dL — ABNORMAL HIGH (ref 70–99)
Glucose-Capillary: 168 mg/dL — ABNORMAL HIGH (ref 70–99)

## 2010-12-05 LAB — SURGICAL PCR SCREEN: MRSA, PCR: NEGATIVE

## 2010-12-05 MED ORDER — IOHEXOL 300 MG/ML  SOLN
50.0000 mL | Freq: Once | INTRAMUSCULAR | Status: AC | PRN
  Administered 2010-12-05: 15 mL via INTRAVENOUS

## 2010-12-06 DIAGNOSIS — N058 Unspecified nephritic syndrome with other morphologic changes: Secondary | ICD-10-CM

## 2010-12-06 DIAGNOSIS — R5381 Other malaise: Secondary | ICD-10-CM

## 2010-12-06 DIAGNOSIS — E669 Obesity, unspecified: Secondary | ICD-10-CM

## 2010-12-06 LAB — BASIC METABOLIC PANEL
BUN: 27 mg/dL — ABNORMAL HIGH (ref 6–23)
CO2: 30 mEq/L (ref 19–32)
Calcium: 7.4 mg/dL — ABNORMAL LOW (ref 8.4–10.5)
Chloride: 102 mEq/L (ref 96–112)
Creatinine, Ser: 1.12 mg/dL (ref 0.4–1.2)
GFR calc Af Amer: 59 mL/min — ABNORMAL LOW (ref >60)
Glucose, Bld: 105 mg/dL — ABNORMAL HIGH (ref 70–99)

## 2010-12-06 LAB — CBC
Hemoglobin: 10.8 g/dL — ABNORMAL LOW (ref 12.0–15.0)
MCH: 30.9 pg (ref 26.0–34.0)
MCHC: 32.6 g/dL (ref 30.0–36.0)
MCV: 94.6 fL (ref 78.0–100.0)

## 2010-12-06 LAB — GLUCOSE, CAPILLARY: Glucose-Capillary: 181 mg/dL — ABNORMAL HIGH (ref 70–99)

## 2010-12-06 NOTE — Discharge Summary (Addendum)
  NAME:  [name] [name], [name] [name]              ACCOUNT NO.:  [id number]  MEDICAL RECORD NO.:  [id number]           PATIENT TYPE:  I  LOCATION:  IC12                          FACILITY:  [facility]  PHYSICIAN:  Niel Hummer, MD    DATE OF BIRTH:  1946/07/26  DATE OF ADMISSION:  11/26/2010 DATE OF DISCHARGE:  LH                              DISCHARGE SUMMARY   REASON FOR TRANSFER:  The patient is at high risk for procedure and might need pressors and critical care specialist.  TRANSFER DIAGNOSES: 1. Congestive heart failure exacerbation on admission with acute     exacerbation. 2. Obstruction proximal left ureteropelvic junction, 7-mm calculus     with associated mild hydronephrosis. 3. Right lower extremity cellulitis. 4. Acute renal failure. 5. Hypotension multifactorial secondary to medications versus early     sepsis.  CURRENT MEDICATIONS: 1. Primaxin 250 mg IV q.6 h. 2. Albuterol 2.5 q.6 h. 3. Tylenol p.r.n. 4. Dilaudid at this time on hold. 5. Vancomycin.  HOSPITAL COURSE:  [name] is a very pleasant 65 year old with multiple medical problems, systolic and diastolic heart failure, ejection fraction 35%.  She presented complaining of flank pain, found to have a UPJ obstruction.  She was admitted to the step-down unit.  On admission, she has CHF exacerbation that is stable at this time.  She received a dose of 40 mg of Lasix x2 during her acute CHF exacerbation.  She was started on vancomycin.  Overnight she spiked a fever in the 102.  Early the morning of transfer, the patient's blood pressure decreased to the 70s range in the setting of morphine, Dilaudid.  With her history of heart failure, we have limitation to use IV fluids.  She received 400 mL bolus.  Her blood pressure has now increased to the 90s range.  Lactic acid was obtained and it was 2.2.  I discussed the case with Dr. Nelda Marseille.  He will evaluate the patient at [facility].  Anesthesiology here feels that the patient  is at high risk for procedure and we have some limitation for care at [facility].  We do not have available critical care medicine in case the patient required intubation and in case the patient required pressors after the procedure.  Urologist has been following the patient.  They were planning to place a stent, were discussing the case with Dr. Michela Pitcher, myself, and anesthesiology.  The patient might need to be on higher level of care. If her blood pressure remain in the 90s range, we will transfer the patient without central line.  I had consulted Dr. Risa Grill and will need to let him know that the patient has arrived to [facility].     Niel Hummer, MD     BR/MEDQ  D:  11/27/2010  T:  11/27/2010  Job:  [id number]  Electronically Signed by Niel Hummer MD on 12/06/2010 08:32:01 AM

## 2010-12-07 LAB — CBC
Hemoglobin: 10.8 g/dL — ABNORMAL LOW (ref 12.0–15.0)
MCV: 94.9 fL (ref 78.0–100.0)
Platelets: 202 10*3/uL (ref 150–400)
RBC: 3.54 MIL/uL — ABNORMAL LOW (ref 3.87–5.11)
WBC: 11.3 10*3/uL — ABNORMAL HIGH (ref 4.0–10.5)

## 2010-12-07 LAB — GLUCOSE, CAPILLARY
Glucose-Capillary: 115 mg/dL — ABNORMAL HIGH (ref 70–99)
Glucose-Capillary: 105 mg/dL — ABNORMAL HIGH (ref 70–99)
Glucose-Capillary: 118 mg/dL — ABNORMAL HIGH (ref 70–99)
Glucose-Capillary: 158 mg/dL — ABNORMAL HIGH (ref 70–99)
Glucose-Capillary: 177 mg/dL — ABNORMAL HIGH (ref 70–99)

## 2010-12-07 LAB — BASIC METABOLIC PANEL
CO2: 30 mEq/L (ref 19–32)
Chloride: 103 mEq/L (ref 96–112)
GFR calc Af Amer: >60 mL/min (ref >60)
Potassium: 3.8 mEq/L (ref 3.5–5.1)

## 2010-12-08 LAB — GLUCOSE, CAPILLARY: Glucose-Capillary: 208 mg/dL — ABNORMAL HIGH (ref 70–99)

## 2010-12-09 ENCOUNTER — Inpatient Hospital Stay
Admission: RE | Admit: 2010-12-09 | Discharge: 2011-01-16 | Disposition: A | Discharge status: Other Acute Inpatient Hospital | Payer: Medicare Other | Source: Ambulatory Visit | Attending: Internal Medicine | Admitting: Internal Medicine

## 2010-12-09 DIAGNOSIS — R52 Pain, unspecified: Principal | ICD-10-CM

## 2010-12-09 DIAGNOSIS — M7989 Other specified soft tissue disorders: Secondary | ICD-10-CM

## 2010-12-09 NOTE — Consult Note (Addendum)
  NAMEMORIA, BROPHY              ACCOUNT NO.:  [id number]  MEDICAL RECORD NO.:  [id number]           PATIENT TYPE:  I  LOCATION:  IC12                          FACILITY:  [facility]  PHYSICIAN:  Marissa Nestle, M.D.DATE OF BIRTH:  05-12-1946  DATE OF CONSULTATION:  11/26/2010 DATE OF DISCHARGE:                                [place] COURSE:  Recurrent left renal colic.  HISTORY OF PRESENT ILLNESS:  This is a 65 year old patient who presented in the emergency room with left renal colic and CT shows that she has multiple stone in the left kidney that is almost 2 cm stone, but there is also 7 mm stone in the left upper ureter causing obstruction.  The patient still having considerable pain, she is being admitted for further management and control of this pain.  Denies any fever, chills, or gross hematuria.  PAST MEDICAL HISTORY: 1. She has history of congestive heart failure, has pacemaker. 2. 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  FAMILY HISTORY:  Negative.  PERSONAL HISTORY:  Does not smoke or drink.  REVIEW OF SYSTEMS:  Otherwise unremarkable.  She has rheumatoid arthritis, walks with the help of a stick.  PHYSICAL EXAMINATION:  GENERAL:  Obese female. VITAL SIGNS:  Blood pressure 140/80, temperature is normal. ABDOMEN:  Markedly obese, left CVA tenderness 1+. PELVIC:  Deferred. EXTREMITIES:  Normal.  IMPRESSION:  I reviewed her KUB and CT scan, the stones are very easily visible on the CT scan, it appear likely ureteral stone, it is in the upper GI tract just at the level of UPJ and it is poorly seen on KUB. The renal stones are easily seen because they are bigger.  She also told me that several years she did have left renal colic but she never passed any stone, but she did pass a stone from the right side.  I have recommended to give her pain medication and if she continues  to have pain I am going to go ahead and schedule her for cysto insertion with double-J stent on Monday under anesthesia.     Marissa Nestle, M.D.     MIJ/MEDQ  D:  11/26/2010  T:  11/27/2010  Job:  [id number]  Electronically Signed by Beaulah Dinning M.D. on 12/09/2010 12:24:59 PM

## 2010-12-15 LAB — GLUCOSE, CAPILLARY
Glucose-Capillary: 182 mg/dL — ABNORMAL HIGH (ref 70–99)
Glucose-Capillary: 153 mg/dL — ABNORMAL HIGH (ref 70–99)
Glucose-Capillary: 173 mg/dL — ABNORMAL HIGH (ref 70–99)

## 2010-12-16 NOTE — Discharge Summary (Signed)
NAMESEVANNA, [name] [name]              ACCOUNT NO.:  [id number]  MEDICAL RECORD NO.:  [id number]           PATIENT TYPE:  I  LOCATION:  2038                         FACILITY:  [place]  PHYSICIAN:  Annita Brod, M.D.DATE OF BIRTH:  [birth year]  DATE OF ADMISSION:  11/26/2010 DATE OF DISCHARGE:  12/09/2010                              DISCHARGE SUMMARY   CONSULTANTS:  Burnett Harry. Joya Gaskins, MD, FCCP and Providence Lanius, MD, Critical Care Medicine. Bernestine Amass, MD, Urology.  DISCHARGE DIAGNOSES: 1. Septic shock brought on by urinary tract infection, brought on by     renal stone obstruction of the left ureter. 2. Status post percutaneous nephrostomy tube. 3. Thrombocytopenia, resolved, secondary to septic shock. 4. Combined acute systolic diastolic heart failure on admission,     resolved. 5. Right lower extremity cellulitis, now resolved. 6. Acute respiratory failure secondary to septic shock, now off     ventilator. 7. Morbid obesity. 8. Deconditioning. 9. Diabetes mellitus, moderately controlled. 10.Stage II decubitus ulcer.  DISCHARGE MEDICATIONS: 1. Tylenol 650 p.o. q.6 h. p.r.n. rectally. 2. Ceftin 500 p.o. b.i.d., stop after December 13, 2010. 3. Lasix 20 mg p.o. daily. 4. Insulin NovoLog sliding scale. 5. Lantus insulin 10 units subcu daily. 6. Lisinopril 5 p.o. daily. 7. Allopurinol 300 p.o. daily. 8. Aspirin 325 p.o. daily. [place] liver oil one capsule p.o. daily. 10.Vitamin B12 one tablet p.o. daily. 11.Vicodin 10/500 one tablet p.o. q.6 h. p.r.n. 12.Multivitamin p.o. daily. 13.Prilosec 20 mg p.o. daily. 14.Probenecid and colchicine 500/0.5 p.o. b.i.d., hold for excess     diarrhea.  The patient previously was on potassium, a combo lisinopril and hydrochlorothiazide, Lasix 40 p.r.n., and a different vitamin B12 agent. These are the medicines of the patient that are put on hold.  DISCHARGE DIET:  Heart-healthy, carb-modified diet.  DISPOSITION:   Improved.  ACTIVITY:  Will be as per skilled nursing facility.  HOSPITAL COURSE:  The patient is a 65 year old white female with past medical history of morbid obesity and congestive heart failure who presented to [facility] on November 26, 2010, complaining of left flank and back pain.  She was found to be in some mild CHF exacerbation on evaluation, was started on diuretics.  She has also had an obstructing proximal left UPJ 7 mm stone as well as a right lower extremity cellulitis.  She was on IV antibiotics, diuretics, and Urology was consulted.  Despite antibiotics overnight after hospital day #1, she spiked a temperature.  Next day, blood pressure dropped into systolic of 55H.  With history of heart failure limitations, there was limitation on how much IV fluid run should we get.  Lactic acid level was obtained and noted to be 2.2.  Following a 400 mL fluid bolus, the patient's systolic blood pressure went to 90.  Dr. Nelda Marseille from [place] was consulted and had the patient arrange for transportation down to [facility].  In addition, Urology who has been following the patient was consulted and Dr. Michela Pitcher from Urology at [facility] was informed of the patient and recommended Urology followup at [facility] once the patient  was transferred.  The patient was transferred down to [facility].  Upon my arrival, she became more even hypotensive and septic secondary to pyelonephritis, brought on by ureteral stone obstruction. She was intubated and put on the ventilator.  Initially, her creatinine was 1.3 and has increased to 2.2 on the day of transfer.  Her prelim blood cultures came back positive.  Gram-negative rods had been growing out pansensitive E. coli.  Dr. Risa Grill was consulted and felt that her creatinine elevation was secondary to obstruction, but also potential initiation of ATN due to sepsis and hypotension.  She was recommended continuation of  IV antibiotics.  IR was consulted.  Films were reviewed and the plan was to proceed with a percutaneous nephrostomy tube drainage of her left kidney with double-J stent placement for improved drainage, and also potentially for the subsequent need for potential percutaneous procedure to rid her off of her stone burden.  Over the next several days, the patient did well.  Her nephrostomy tube was again placed on November 27, 2010.  Her urine for both E. coli as well as Proteus both were pansensitive.  She responded well.  Her breathing improved. She was continued on the sepsis protocol, extubated on December 02, 2010. Over the next several days, she progressed.  Her creatinine which had peaked as high as 7.9 started to improve as well.  By December 03, 2010, she was stabilized, admitted to the floor.  By December 04, 2010, she was transferred over to the [facility].  On December 06, 2010, she was quite deconditioned.  Inpatient rehab was consulted.  The patient was felt to be still too weak to participate in their aggressive treatment regimen and I recommended skilled nursing facility to currently being sought after.  Urology has followed up the patient on December 06, 2010, recommended 1 more week of antibiotics until December 13, 2010, will then follow up as an outpatient with removal of the percutaneous nephrostomy tube at that time.  The patient's disposition is improved.  Activity will be as per skilled nursing facility.  FOLLOWUP APPOINTMENTS:  The patient will follow up with Urology, Dr. Rana Snare, some time between December 13, 2010, and December 20, 2010.  As on December 07, 2010, the patient's creatinine was down to 1.07.  White count was minimally elevated at 11.3.  The patient was doing well.  With her multiple antibiotics, her cellulitis had resolved.  She was noted to have some multiple stage II wounds, skin sloughing, and other issues of the bottom of the sacrum and back.  Given her severe  morbid obesity and the fact that she had been in bed, she is receiving __________ powder to her skin fold and watch for any worsening of any type of decubitus ulcer.     Annita Brod, M.D.     SKK/MEDQ  D:  12/07/2010  T:  12/08/2010  Job:  [id number]  cc:   Bernestine Amass, M.D. Providence Lanius, MD Marissa Nestle, M.D. Dr. Hubbard Hartshorn  Electronically Signed by Gevena Barre M.D. on 12/16/2010 09:53:57 AM

## 2010-12-25 HISTORY — PX: OTHER SURGICAL HISTORY: SHX169

## 2011-01-01 ENCOUNTER — Ambulatory Visit (HOSPITAL_COMMUNITY)
Admission: RE | Admit: 2011-01-01 | Discharge: 2011-01-01 | Disposition: A | Discharge status: Home / Self Care | Payer: Medicare Other | Source: Ambulatory Visit | Attending: Internal Medicine | Admitting: Internal Medicine

## 2011-01-01 ENCOUNTER — Ambulatory Visit (HOSPITAL_COMMUNITY): Payer: Medicare Other | Attending: Internal Medicine

## 2011-01-01 DIAGNOSIS — X58XXXA Exposure to other specified factors, initial encounter: Secondary | ICD-10-CM | POA: Insufficient documentation

## 2011-01-01 DIAGNOSIS — S2239XA Fracture of one rib, unspecified side, initial encounter for closed fracture: Secondary | ICD-10-CM | POA: Insufficient documentation

## 2011-01-01 DIAGNOSIS — R0789 Other chest pain: Secondary | ICD-10-CM | POA: Insufficient documentation

## 2011-01-02 ENCOUNTER — Ambulatory Visit (HOSPITAL_COMMUNITY): Payer: Medicare Other | Attending: Internal Medicine

## 2011-01-02 DIAGNOSIS — R109 Unspecified abdominal pain: Secondary | ICD-10-CM | POA: Insufficient documentation

## 2011-01-10 ENCOUNTER — Other Ambulatory Visit: Payer: Self-pay | Admitting: Urology

## 2011-01-10 ENCOUNTER — Encounter (HOSPITAL_COMMUNITY): Payer: Medicare Other

## 2011-01-10 DIAGNOSIS — Z01818 Encounter for other preprocedural examination: Secondary | ICD-10-CM | POA: Insufficient documentation

## 2011-01-10 DIAGNOSIS — Z01812 Encounter for preprocedural laboratory examination: Secondary | ICD-10-CM | POA: Insufficient documentation

## 2011-01-10 LAB — BASIC METABOLIC PANEL
BUN: 14 mg/dL (ref 6–23)
Calcium: 9.4 mg/dL (ref 8.4–10.5)
Creatinine, Ser: 1.53 mg/dL — ABNORMAL HIGH (ref 0.4–1.2)
Glucose, Bld: 111 mg/dL — ABNORMAL HIGH (ref 70–99)
Potassium: 4.2 mEq/L (ref 3.5–5.1)

## 2011-01-10 LAB — CBC
HCT: 33 % — ABNORMAL LOW (ref 36.0–46.0)
MCH: 30.9 pg (ref 26.0–34.0)
MCV: 95.4 fL (ref 78.0–100.0)
RDW: 16 % — ABNORMAL HIGH (ref 11.5–15.5)
WBC: 6 10*3/uL (ref 4.0–10.5)

## 2011-01-10 LAB — SURGICAL PCR SCREEN
MRSA, PCR: NEGATIVE
Staphylococcus aureus: NEGATIVE

## 2011-01-11 ENCOUNTER — Inpatient Hospital Stay (HOSPITAL_COMMUNITY): Payer: Medicare Other | Attending: Internal Medicine

## 2011-01-11 ENCOUNTER — Ambulatory Visit (HOSPITAL_COMMUNITY): Payer: Medicare Other

## 2011-01-12 ENCOUNTER — Telehealth: Payer: Self-pay | Admitting: Internal Medicine

## 2011-01-12 NOTE — Telephone Encounter (Signed)
[name] [name] needs to speak with kelly re pt pre-surgical test.

## 2011-01-12 NOTE — Telephone Encounter (Signed)
Faxing today

## 2011-01-13 ENCOUNTER — Telehealth: Payer: Self-pay | Admitting: Internal Medicine

## 2011-01-13 NOTE — Telephone Encounter (Signed)
Paper was faxed 01/12/11 Will resend today Carla aware

## 2011-01-16 ENCOUNTER — Inpatient Hospital Stay (HOSPITAL_COMMUNITY): Payer: Medicare Other

## 2011-01-16 ENCOUNTER — Ambulatory Visit (HOSPITAL_COMMUNITY)
Admission: RE | Admit: 2011-01-16 | Discharge: 2011-01-17 | Disposition: A | Discharge status: Rehabilitation Facility | Payer: Medicare Other | Source: Ambulatory Visit | Attending: Urology | Admitting: Urology

## 2011-01-16 DIAGNOSIS — N200 Calculus of kidney: Principal | ICD-10-CM | POA: Insufficient documentation

## 2011-01-16 DIAGNOSIS — R3989 Other symptoms and signs involving the genitourinary system: Secondary | ICD-10-CM | POA: Insufficient documentation

## 2011-01-16 DIAGNOSIS — Z79899 Other long term (current) drug therapy: Secondary | ICD-10-CM | POA: Insufficient documentation

## 2011-01-16 DIAGNOSIS — G4733 Obstructive sleep apnea (adult) (pediatric): Secondary | ICD-10-CM | POA: Insufficient documentation

## 2011-01-16 DIAGNOSIS — I10 Essential (primary) hypertension: Secondary | ICD-10-CM | POA: Insufficient documentation

## 2011-01-16 DIAGNOSIS — Z950 Presence of cardiac pacemaker: Secondary | ICD-10-CM | POA: Insufficient documentation

## 2011-01-16 DIAGNOSIS — Z01812 Encounter for preprocedural laboratory examination: Secondary | ICD-10-CM | POA: Insufficient documentation

## 2011-01-17 LAB — CBC
HCT: 30.9 % — ABNORMAL LOW (ref 36.0–46.0)
MCH: 31.5 pg (ref 26.0–34.0)
MCHC: 33 g/dL (ref 30.0–36.0)
MCV: 95.4 fL (ref 78.0–100.0)
Platelets: 159 10*3/uL (ref 150–400)
RDW: 16 % — ABNORMAL HIGH (ref 11.5–15.5)

## 2011-01-17 LAB — BASIC METABOLIC PANEL
BUN: 16 mg/dL (ref 6–23)
Calcium: 8.9 mg/dL (ref 8.4–10.5)
Creatinine, Ser: 1.51 mg/dL — ABNORMAL HIGH (ref 0.4–1.2)
GFR calc non Af Amer: 35 mL/min — ABNORMAL LOW (ref >60)
Glucose, Bld: 129 mg/dL — ABNORMAL HIGH (ref 70–99)
Potassium: 5.2 mEq/L — ABNORMAL HIGH (ref 3.5–5.1)

## 2011-01-18 ENCOUNTER — Inpatient Hospital Stay
Admission: RE | Admit: 2011-01-18 | Discharge: 2011-01-30 | Disposition: A | Discharge status: Home / Self Care | Payer: Medicare Other | Source: Ambulatory Visit | Attending: Internal Medicine | Admitting: Internal Medicine

## 2011-01-18 NOTE — Op Note (Signed)
NAMEKATYANA, TROLINGER              ACCOUNT NO.:  [id number]  MEDICAL RECORD NO.:  [id number]           PATIENT TYPE:  O  LOCATION:  1407                         FACILITY:  [facility]  PHYSICIAN:  Bernestine Amass, M.D.  DATE OF BIRTH:  1946-02-02  DATE OF PROCEDURE:  01/16/2011 DATE OF DISCHARGE:                              OPERATIVE REPORT   PREOPERATIVE DIAGNOSIS:  Multiple left renal calculi.  POSTOPERATIVE DIAGNOSIS:  Multiple left renal calculi.  PROCEDURE PERFORMED:  Percutaneous nephrolithotomy.  SURGEON:  Bernestine Amass, M.D.  ASSISTANT:  Dr. Jerilynn Mages. Daryll Brod from Interventional Radiology.  ANESTHESIA:  General endotracheal  INDICATIONS:  Ms. Colclasure is 65 years of age.  She was seen initially as a consultation at [facility] where she had acute hydronephrosis and urosepsis.  At that time, she had a 7-mm stone at her ureteropelvic junction along with a 15+ mm stone in her left renal pelvis and a smaller stone as well.  The patient had left percutaneous nephrostomy tube placed, and she subsequently resolved her urosepsis.  She had a prolonged hospitalization and has been at the [facility]. She was seen for followup in our office, and we discussed options with her.  Given the stone burden as well as the placement already of a nephrostomy tube, we felt that percutaneous nephrolithotomy made the most sense.  She appeared to understand the advantages and disadvantages, potential complications.  Of note, she has finished a recent course of Rocephin.  She has had placement of PAS compression boots and has received some perioperative gentamicin.  She presents now for the procedure.  TECHNIQUE AND FINDINGS:  The patient was brought to the operating room where she had successful induction of endotracheal anesthesia.  She was then placed in a prone position with all extremities carefully padded and protected.  Nephrostomy tube was cut and then prepped and  draped within the sterile field.  Fluoroscopy was used under real time during the procedure.  Interventional Radiology initiated the procedure by placing a guidewire through the nephrostomy tube down to the bladder with fluoroscopic guidance.  A peel-away sheath was placed and a second safety wire was placed.  The fascia was then dilated, and the nephrostomy tract dilated with the fascial dilating balloon and a large access sheath was placed. With the nephroscope, we were able to encounter the large approximately 2-cm stone in her left renal pelvis.  The lithotrite instrument was utilized to break the stone into 4 fragments which were all removed.  We were then able to visualize a 7-8 mm stone in the ureteropelvic junction, which was removed with a grasper.  A second 6-mm stone in the renal pelvis was removed and with fluoroscopy we could appreciate no other stones of significance.  Visualization remained excellent throughout the procedure.  Saline was used as an irrigant.  There was no evidence of any significant bleeding.  No evidence of collecting system perforation or injury.  We felt that given the ease of the procedure that she could be done without the placement of a nephrostomy tube.  The guidewires were removed and a dressing  placed over the flank incision/nephrostomy tube site.  Blood loss was minimal. She had no obvious complications and was brought to recovery room in stable condition.     Bernestine Amass, M.D.     DSG/MEDQ  D:  01/17/2011  T:  01/17/2011  Job:  [id number]  Electronically Signed by Rana Snare M.D. on 01/18/2011 11:25:45 AM

## 2011-02-07 NOTE — Cardiovascular Report (Signed)
NAMEELVA, MAURO              ACCOUNT NO.:  [id number]   MEDICAL RECORD NO.:  [id number]          PATIENT TYPE:  INP   LOCATION:  2038                         FACILITY:  [facility]   PHYSICIAN:  Bruce R. Olevia Perches, MD, FACCDATE OF BIRTH:  1945-12-31   DATE OF PROCEDURE:  07/06/2008  DATE OF DISCHARGE:                            CARDIAC CATHETERIZATION   CLINICAL HISTORY:  Ms. Amborn is a 65 year old woman who is followed by  Dr. Lovena Le.  She has chronic atrial fibrillation is status post AV nodal  ablation.  She also has obesity and sleep apnea.  She was admitted to  the hospital with chest pain and recommendations were for evaluation of  the right and left heart catheterization and coronary angiography.   PROCEDURE:  The procedure was performed via the femoral artery using  arterial sheath and a 5-French coronary catheters.  Right heart  catheterization was formed percutaneously via the femoral vein using the  venous sheath and Swan-Ganz thermodilution catheter.  Access was quite  difficult due to the patient's weight, but we accomplished this without  major problems.  The patient tolerated the procedure well and left the  laboratory in satisfactory condition.   RESULTS:  The left main coronary artery.  The left main coronary artery  is free of significant disease.   The left anterior descending artery.  The left anterior descending  artery gave rise to two small large diagonal branch and a large septal  perforator.  These and LV proper were free of significant disease.   The circumflex artery.  The circumflex artery gave rise to early  marginal branches and bifurcated in two sub-branches.  The circumflex  gave rise to an atrial branch and a posterolateral branch.  These  vessels were free of significant disease.   The right coronary artery.  The right coronary artery is a dominant  vessel that gave rise to a conus branch, two small right ventricular  branch, a posterior  descending branch and a posterolateral branch.  These vessels were free of significant disease.   The left ventriculogram.  The left ventriculogram performed in the RAO  projection showed vigorous wall motion with no areas of hypokinesis.  There were frequent PVCs and it was difficult to assess if there was any  mitral regurgitation.   HEMODYNAMIC DATA:  The right atrial pressure was 14 mean.  The pulmonary  artery pressure was 36/22 with a mean of 30.  The pulmonary wedge  pressure was 16 mean.  The left ventricular pressure was 128/23.  The  aortic pressure was's 128/69 with a mean of 81.  The cardiac  output/cardiac index was 7.2/2.8 L/min/m2.   CONCLUSIONS:  1. Normal coronary angiography.  2. Mild elevation of pulmonary artery wedge pressure and moderate      elevation of pulmonary artery pressure.   RECOMMENDATIONS:  Reassurance.  We will plan continued treatment of  sleep apnea and further attempts at weight reduction.      Bruce Alfonso Patten Olevia Perches, MD, [facility]  Electronically Signed     BRB/MEDQ  D:  07/06/2008  T:  07/07/2008  Job:  [id number]   cc:   Champ Mungo. Lovena Le, MD  Janifer Adie, M.D.  Cardiopulmonary

## 2011-02-07 NOTE — Discharge Summary (Signed)
[name] [name], [name] [name]              ACCOUNT NO.:  [id number]   MEDICAL RECORD NO.:  [id number]          PATIENT TYPE:  INP   LOCATION:  5029                         FACILITY:  [facility]   PHYSICIAN:  Carey Bullocks. Gertie Exon, MD     DATE OF BIRTH:  1946-06-16   DATE OF ADMISSION:  02/07/2007  DATE OF DISCHARGE:  02/11/2007                               DISCHARGE SUMMARY   DISCHARGE MEDICATIONS:  1. Clindamycin 900 mg 3 times daily for 2 weeks.  2. Potassium chloride 10 mEq 2 times daily.  3. Lisinopril/hydrochlorothiazide 10/12.5 1/2 tablet daily.  4. Colchicine 0.6 mg twice daily.  5. Allopurinol 150 mg daily.  6. Lasix 40 mg daily if needed.  7. Aspirin 325 mg daily.  8. Glucosamine 500 mg daily.  9. Chondroitin 400 mg daily.   ALLERGIES:  PENICILLIN, CIPRO, CODEINE, AND CEPHALEXIN.   DISCHARGE DIAGNOSIS:  Cellulitis.   SUMMARY OF HOSPITALIZATION:  This patient is a 65 year old female with a  past medical history notable for diabetes mellitus, morbid obesity,  diverticulitis, and sleep apnea.  She was admitted to the hospital on  Feb 07, 2007 with a diagnosis of cellulitis of both lower extremities.  Because of her medication allergies, she was treated with vancomycin and  clindamycin.  She quickly had a favorable clinical response.  By the day  after her admission, her erythema on her lower extremities had receded  and her legs continued to improve throughout the remainder of her  hospitalization.  Doppler studies of the lower extremities were  performed on Feb 08, 2007.  These were negative for evidence of a deep  vein thrombosis.  The patient will be discharged home at this time.  She  will receive an additional 14 days of clindamycin therapy, which should  cover streptococcus and staphylococcus.  I have instructed the patient  to follow up with Dr. Cori Razor within the next 7 days for a clinical  assessment.      Carey Bullocks. Gertie Exon, MD  Electronically Signed     PML/MEDQ  D:   02/11/2007  T:  02/11/2007  Job:  [id number]   cc:   Janifer Adie, M.D.

## 2011-02-07 NOTE — Assessment & Plan Note (Signed)
[facility]                         ELECTROPHYSIOLOGY OFFICE NOTE   NAME:[name] [name], [name] [name]                     MRN:          [id number]  DATE:11/29/2007                            DOB:          [birth year]    HISTORY:  [name] returns today for followup.  She is a very  pleasant, morbidly obese middle-aged woman with a history of complete  heart block following AV node ablation and chronic atrial fibrillation.  She returns today for followup.  She has been stable.  She continues to  be morbidly obese and has not lost any weight to speak of.  She denies  chest pain.  She has dyspnea on exertion.   PHYSICAL EXAM:  GENERAL:  Notable for her being a pleasant, middle-aged  obese woman in no distress.  VITAL SIGNS:  Blood pressure was 137/75.  The pulse was 90 and regular,  respirations were 18.  LUNGS:  Clear bilaterally to auscultation.  No wheezes, rales or rhonchi  are present.  CARDIOVASCULAR:  Regular rate and rhythm.  Normal S1 and S2.  ABDOMEN:  Obese, nontender, nondistended.  EXTREMITIES:  Demonstrated trace peripheral edema bilaterally.   MEDICATIONS:  1. Include colchicine 0.6 once daily.  2. Lisinopril/HCTZ 10/12.5 daily.  3. Potassium supplements.  4. Hydrocodone p.r.n.  5. Furosemide p.r.n.  6. Multiple vitamins.   Interrogation of her pacemaker demonstrates a St. Jude Affinity with no  R waves with the impedance 612 and threshold 0.5 at 0.5 and the battery  voltage was 2.76 volts.  There were no R waves noted.  Estimated  longevity was between 3 and 4 years.   IMPRESSION:  1. Symptomatic bradycardia.  2. Atrial fibrillation.  3. Complete heart block.  4. Status post pacemaker insertion.   DISCUSSION:  Overall [name] [name] is stable.  She continues to refuse  taking Coumadin.  Her weight unfortunately remains increased.  We will  plan to see her back for a pacemaker check in 1 year.  She will follow  up in 6 months in our  device clinic.    [name] [name]. [name] Le, MD  Electronically Signed   GWT/MedQ  DD: 11/29/2007  DT: 12/01/2007  Job #: [id number]

## 2011-02-07 NOTE — Assessment & Plan Note (Signed)
[facility]                       [place] CARDIOLOGY OFFICE NOTE   NAME:[name] [name]                     MRN:          [id number]  DATE:06/24/2008                            DOB:          November 25, 1945    PRIMARY CARE PHYSICIAN:  Janifer Adie, MD   ELECTROPHYSIOLOGIST:  Champ Mungo. Lovena Le, MD   REASON FOR VISIT:  Chest pain.   HISTORY OF PRESENT ILLNESS:  This is my first meeting with [name] [name].  She is typically followed by Dr. Lovena Le in the [facility] with a  history of atrial fibrillation (refuses Coumadin) and complete heart  block status post placement of a St. Jude pacemaker.  She has a history  of hypertension, morbid obesity with limited functional capacity  requiring a motorized wheelchair, and congestive heart failure with  previous diagnosis of diastolic dysfunction combined with systolic  dysfunction and a reduced ejection fraction to approximately 40% noted  in the past.  She does not have any clear history of obstructive  coronary artery disease and was in fact, in the setting of chest pain,  referred for a diagnostic cardiac catheterization done by Dr. Olevia Perches  back in September 2006 which demonstrated normal coronary arteries.  She  did have mild global hypokinesis and asynchronous contraction of the  ventricle at that time.   [name] [name] was recently in the [facility] and mentioned to the  electrophysiology nurse that she was having some chest pain.  She was  therefore scheduled to see me today.  In reviewing [name]  symptoms, she describes approximately a 6 to 8 month history of  intermittent discomfort in her left lower costal area extending upward  and posteriorly to the shoulder blade and then down into the left arm.  This is very sporadic and not specifically exertional in nature.  She  also experiences a pressure in the left side of her neck with this at  times.  Symptoms have been more prominent in the  last several weeks.  She has otherwise had no palpitations or syncope.  She reports taking an  aspirin when this happens, but not using nitroglycerin as this has given  her severe headaches in the past.  She has not had any interval ischemic  testing since 2006.  Her electrocardiogram is not particularly helpful  showing a ventricular paced rhythm at 90 beats per minute.  I reviewed  the situation extensively with the patient, and we went over a variety  of options including observation with appropriate directed medical  therapy, proceeding with a limited noninvasive evaluation, and  proceeding on to a full invasive evaluation.  She was most comfortable  with noninvasive testing and limited medical therapy.   ALLERGIES:  CODEINE, CIPRO, and PENICILLIN.   Present medications include colchicine 0.6 mg p.o. daily, lisinopril,  hydrochlorothiazide 10/12.5 mg one-half tablet p.o. daily, potassium  chloride 10 mg 3 tablets p.o. daily, aspirin 325 mg one-half tablet p.o.  daily, cod liver oil supplements, hydrocodone 1000 mg 2-3 times a day,  Lasix 20-40 mg p.o. daily p.r.n., vitamin B12 supplements, allopurinol  150 mg p.o. daily, and  multivitamin.   REVIEW OF SYSTEMS:  As outlined above.  Otherwise, negative.   PHYSICAL EXAMINATION:  GENERAL:  [name] reports her weight to be  378 pounds.  This is a morbidly obese woman seated in a motorized  wheelchair in no acute distress.  VITAL SIGNS:  Heart rate was 100 and regular at rest, blood pressure  140/91, oxygen saturation 95% on room air.  HEENT:  Conjunctivae was normal.  Pharynx clear.  Poor dentition.  NECK:  Supple.  Increased girth.  Unable to accurately assess for  jugular venous pressure.  LUNGS:  Exhibit diminished, but clear breath sounds.  No wheezing or  labored breathing.  CARDIAC:  Regular rate and rhythm.  No loud systolic murmur or obvious  S3 or gallop.  ABDOMEN:  Massively obese.  Bowel sounds present.  No tenderness  noted.  EXTREMITIES:  Exhibit chronic woody edema with venostasis bilaterally.  Distal pulses are unable to be accurately palpated due to the amount of  edema.  MUSCULOSKELETAL:  No kyphosis noted.  NEUROPSYCHIATRIC:  The patient is alert and oriented x3.   IMPRESSION AND RECOMMENDATIONS:  1. Intermittent chest, neck, and left arm discomfort, sporadic over      the last 6-8 months, as outlined above.  Fortunately, previous      cardiac evaluation was reassuring including normal coronary      arteries at cardiac catheterization in 2006, although she has had      no interval ischemic workup and her risk factors including morbid      obesity, hypertension, and previously noted hyperlipidemia.  As      outlined above, I extensively reviewed the situation with the      patient.  She is most comfortable with a basic noninvasive workup      at this time and perhaps some medication adjustments although she      seems hesitant to make any major changes at this time.  We elected      to institute Coreg at 3.125 mg p.o. b.i.d. given her previously      well-documented history of cardiomyopathy and also followup with a      2-D echocardiogram to reassess left ventricular systolic function.      We also discussed proceeding with a 2-day adenosine Myoview to      assess for ischemia.  I expect that this test will be fraught with      artifact, although hopefully we can make some assessment as to      whether she has low-risk features or high-risk features that might      help guide further therapy.  Otherwise, I will plan to have her      come back over the next few weeks to discuss the results.  2. History of atrial fibrillation (the patient refuses Coumadin) and      complete heart block status post St. Jude pacemaker placement in      the past.  She will continue followup for the Device clinic as      already arranged.     Satira Sark, MD  Electronically Signed    SGM/MedQ  DD:  06/24/2008  DT: 06/25/2008  Job #: [id number]   cc:   Janifer Adie, M.D.

## 2011-02-07 NOTE — Discharge Summary (Signed)
NAMEDARNESHIA, DEMARY NO.:  [id number]   MEDICAL RECORD NO.:  [id number]          PATIENT TYPE:  INP   LOCATION:  2038                         FACILITY:  [facility]   PHYSICIAN:  Champ Mungo. Lovena Le, MD    DATE OF BIRTH:  December 03, 1945   DATE OF ADMISSION:  07/03/2008  DATE OF DISCHARGE:  07/07/2008                               DISCHARGE SUMMARY   ADDENDUM   PRIMARY CARDIOLOGIST:  Satira Sark, M.D.   Secondary to low blood pressures in the low 100s, Dr. Lovena Le decided to  discontinue the patient's carvedilol at the time of discharge.      Murray Hodgkins, ANP      [place] Lovena Le, MD  Electronically Signed    CB/MEDQ  D:  07/07/2008  T:  07/08/2008  Job:  [id number]

## 2011-02-07 NOTE — H&P (Signed)
[name] [name], [name] [name]              ACCOUNT NO.:  [id number]   MEDICAL RECORD NO.:  [id number]          PATIENT TYPE:  INP   LOCATION:  3706                         FACILITY:  [facility]   PHYSICIAN:  Girard Cooter, MD         DATE OF BIRTH:  1946-02-17   DATE OF ADMISSION:  02/07/2007  DATE OF DISCHARGE:                              HISTORY & PHYSICAL   HISTORY OF PRESENT ILLNESS:  The patient is a 65 year old morbidly obese  female who presents here with bilateral lower extremity cellulitis,  getting progressively worse in the last 2-3 weeks.  The patient  apparently has a long history of chronic cellulitis but the patient was  on outpatient antibiotics.  Apparently, the patient is allergic to  multiple types of antibiotics including penicillins and ciprofloxacin.  The patient also has a history of  atrial fibrillation with a rapid  ventricular response on November 2000.  She is status post ablation and  permanent pacemaker implantation.  The patient also has a history of  sleep apnea.  She is currently on BiPAP.   PAST MEDICAL HISTORY/PAST SURGICAL HISTORY:  1. As above.  2. History of uncontrolled blood sugars, especially during times of      infection.  3. History of diverticulitis.  4. History of sleep apnea.   SOCIAL HISTORY:  Denies drugs, alcohol, or smoking.   MEDICATIONS:  Currently, the patient is taking:  1. Potassium chloride 10 mEq two times daily.  2. Lisinopril/hydrochlorothiazide 10/12.5, 1/2 p.o. daily.  3. Colchicine 0.6 mg b.i.d.  4. Allopurinol 150 mg one time a day.  5. Lasix 40 mg p.r.n.  6. Prednisone 10 mg for the last 2 months due to osteoarthritic pain.  7. Aspirin 325 mg p.o. daily.  8. Glucosamine 500 mg p.o. daily.  9. Chondroitin 400 mg two times a day.   ALLERGIES:  THE PATIENT IS ALLERGIC TO:  1. PENICILLIN - hives.  2. CIPRO - hives.  3. CODEINE - swelling.  4. PEPPERMINT.  5. CEPHALEXIN - breathing problems.   PHYSICAL EXAMINATION:  VITAL  SIGNS:  Blood pressure 131/64, pulse 98,  respirations 20, temperature 97.8, pulse ox 100% on room air.  HEENT:  Normocephalic, atraumatic.  Sclerae anicteric.  Mucous membranes  are moist.  NECK:  Supple.  No JVD.  No carotid bruits.  CARDIOVASCULAR:  S1 and S2.  Regular rate and rhythm.  No murmurs, rubs,  or clicks.  ABDOMEN:  Soft, nontender, nondistended.  Positive bowel sounds.  LUNGS:  Clear to auscultation bilaterally.  No rhonchi, wheezes, or  rales.  NEUROLOGIC:  Grossly intact.  EXTREMITIES:  Bilateral lower extremity edema, swelling, cellulitic  changes up to 3/4ths up to the knee.  Pulses 2+ bilaterally of upper and  lower extremities.   LABORATORY DATA:  Basic metabolic panel within normal limits.  CBC -  white count not elevated within normal limits.   ASSESSMENT/PLAN:  This is a 65 year old female, morbidly obese, here  with worsening cellulitis.  1. Start IV antibiotics, IV vancomycin, one time IV daily and      clindamycin 300  mg q.6 h. IV piggyback.  2. Bilateral lower extremity Doppler, rule out deep vein thrombosis.  3. For sleep apnea, continue BiPAP 19/12.  May use home machine.  4. Continue aspirin but decrease to 81 mg p.o. daily.  5. For gastric protection, Protonix 40 mg p.o. daily.  6. For pain, hydrocodone.  7. For fluid overload, continue Lasix 40 mg p.o. daily and replete      with potassium chloride 20 mEq p.o. daily.  8. For history of gout, continue colchicine, allopurinol per home      doses.  9. We will put her on a sliding scale insulin and cover with Lantus      given her high blood sugars.  10.The patient is expected to do fairly well.  I explained all plans      and procedures during this admission.  The patient understands.      Girard Cooter, MD  Electronically Signed     RR/MEDQ  D:  02/08/2007  T:  02/08/2007  Job:  [id number]   cc:   Janifer Adie, M.D.

## 2011-02-07 NOTE — Assessment & Plan Note (Signed)
[facility] HEALTHCARE                       [place] CARDIOLOGY OFFICE NOTE   NAME:[name] [name], [name] [name]                     MRN:          [id number]  DATE:07/21/2008                            DOB:          01-23-1946    PRIMARY CARE PHYSICIAN:  Janifer Adie, MD.   ELECTROPHYSIOLOGIST:  Champ Mungo. Lovena Le, MD.   REASON FOR VISIT:  Post hospital followup.   HISTORY OF PRESENT ILLNESS:  I just recently met [name] [name] in the  office, as detailed in the complete note from June 24, 2008.  She  was referred to me from the [facility] given recurrent episodes of  chest pain.  She had undergone previous cardiac evaluation demonstrating  normal coronary arteries back in 2006.  She has a history of symptomatic  bradycardia and complete heart block also associated with atrial  fibrillation (refuses Coumadin) and is status post placement of a St.  Jude pacemaker, followed by Dr. Lovena Le.  Previous assessment of left  ventricular function had been as low as 40%.  Based on this and our  discussion at that time, I elected to start her on low-dose Coreg 3.125  mg p.o. b.i.d. and followup with a 2D echocardiogram and 2-day adenosine  Myoview.  Her echocardiogram from June 30, 2008, described a left  ventricular ejection fraction of 30-35% with hypokinesis of the mid-to-  distal anterior wall and septum and apical akinesis.  Pacemaker wire was  noted within the right ventricle.  She had moderate mitral regurgitation  at that time.  Her followup Myoview read by Dr. Lattie Haw described mild  left ventricular dilatation with mild-to-moderate left ventricular  dysfunction and ejection fraction of 40% and segmental pattern.  There  were multiple defects consistent with breast and diaphragmatic  attenuation; however, inferiorapical and mild anteroseptal scarring  could not be unequivocally excluded.  My plan at that time was to have  the patient come back and be evaluated  in the office; however, in the  interim she was admitted to [facility] with chest pain.   She actually was initially angry with me today having decided that the  Coreg was the main reason for all of her recent problems.  She felt like  it made her heart rate too slow despite the fact that she has a  pacemaker in place to prevent this.  In reviewing the data, her history  and physical actually indicates that her heart rate was in the 80s when  she presented and her main complaint was of chest pain.  She was  admitted to [facility] and ruled out for myocardial infarction.  The decision was made to proceed with a diagnostic cardiac  catheterization, which was undertaken by Dr. Olevia Perches on July 06, 2008.  The study revealed normal coronary arteries with mild elevation in  pulmonary artery wedge pressures and elevation in pulmonary artery  pressure (36/22, actually mild).  Left ventriculogram performed in the  RAO projection revealed vigorous wall motion with no areas of  hypokinesis, although frequent premature ventricular complexes made it  difficult to assess the degree of mitral regurgitation.  Based on this  medical therapy and risk factor modification was recommended.  She was  discharged on July 07, 2008, and just prior to her discharge, Dr.  Lovena Le elected to stop her Coreg with concerns about somewhat low blood  pressure, not heart rate.  I reviewed all this information in detail  today with [name] [name].  I also reviewed the original indications for  her pacemaker, which she was not clear on either.  I found the  diagnostic catheterization information quite reassuring and recommended  that she continue her regular follow up with [facility] and otherwise  risk factor modification.  I once again recommended weight loss and  increased activity as best she can tolerate.   ALLERGIES:  CODEINE, CIPRO, and PENICILLIN.   MEDICATIONS BY DISCHARGE SUMMARY:  1.  Potassium chloride 10 mEq p.o. t.i.d.  2. Lisinopril/hydrochlorothiazide 10/12.5 mg 1/2 tablet p.o. daily.  3. Aspirin 81 mg p.o. daily.  4. Hydrocodone 10/500 mg p.o. t.i.d.  5. Allopurinol 300 mg p.o. daily.  6. Colchicine 0.6 mg p.o. p.r.n.  7. Lasix 20 mg p.o. p.r.n. with potassium.  8. Cod liver oil extract daily.  9. Vitamin B12 daily.  10.Multivitamin daily.  11.She was taken off Coreg 3.125 mg p.o. b.i.d.   REVIEW OF SYSTEMS:  As outlined above.  [name] [name] states that she lost  a lot of fluid weight with diuresis in the hospital.  In reviewing her  weights, these were fairly stable with the exception of the last day,  and I suspect this may have been an error.  Her weight is essentially  the same as it was I last saw her initially upon return to the office  today.  Otherwise, negative.   PHYSICAL EXAMINATION:  VITAL SIGNS:  Blood pressure is 143/83, heart  rate is 90 and regular, and weight is 375 pounds.  GENERAL:  This is a morbidly obese woman, seated in an automatic  wheelchair, and in no acute distress.  NECK:  No obvious elevated jugular venous pressure or loud bruits.  LUNGS:  Clear with diminished breath sounds.  CARDIAC:  Regular rate and rhythm without loud systolic murmur or S3  gallop.  EXTREMITIES:  Exhibit chronic-appearing woody edema and venostasis.  Distal pulses are unchanged.   IMPRESSION AND RECOMMENDATIONS:  1. History of recurrent chest pain, now status post documentation of      normal coronary arteries at cardiac catheterization this month by      Dr. Olevia Perches.  At that time, her left ventricular systolic function      was described as vigorous, although previously she had      documentation of decreased left ventricular function.  I wonder if      her pacemaker function causes this appearance on noninvasive      studies.  This was, in fact, my original rationale for placing her      on low-dose Coreg, although this was discontinued during her  recent      hospital stay by Dr. Lovena Le.  It is very unlikely that her chest      pain symptoms are ischemic in etiology, and I have recommended      basic cardiac risk factor modification, weight loss, and increased      activity, with attention to therapy for sleep apnea.  Her pulmonary      artery systolic pressures were actually mildly increased based on      review of the data.  I will plan  to see her back over the next 6      months.  2. History of symptomatic bradycardia, complete heart block, and      atrial fibrillation (tachycardia-bradycardia syndrome) status post      St. Jude pacemaker placement by Dr. Lovena Le.  She has refused      Coumadin consistently and therefore remains at risk for stroke.      She will keep her followup visit with Dr. Lovena Le as already      scheduled.     Satira Sark, MD  Electronically Signed    SGM/MedQ  DD: 07/21/2008  DT: 07/22/2008  Job #: [id number]   cc:   Janifer Adie, M.D.  Champ Mungo. Lovena Le, MD

## 2011-02-07 NOTE — Assessment & Plan Note (Signed)
 HEALTHCARE                         ELECTROPHYSIOLOGY OFFICE NOTE   NAME:[name] [name] VERNO                     MRN:          [id number]  DATE:01/06/2009                            DOB:          [birth year]    Ms. [name] [name] returns today for followup.  She is a very pleasant middle-  aged morbidly obese woman with a history of chronic atrial fibrillation  and complete heart block after AV node ablation status post pacemaker  insertion.  She has a history of diastolic heart failure.  She has  chronic peripheral edema and venous stasis ulcers.  She returns today  for followup.  Really no change since we last saw her several months  ago.  She denies chest pain.  Her dyspnea is class II to III.  She has  chronic peripheral edema.  She states that she is unable to take  antibiotics because of problems with allergies.   CURRENT MEDICATIONS:  1. Colchicine 0.6 mg daily.  2. Lisinopril/hydrochlorothiazide 10/12.5 daily.  3. Potassium 10 mEq 3 tablets daily.  4. Aspirin 325 a day.  5. Hydrocodone daily.  6. Furosemide 20-40 mg p.r.n. daily.  7. Remotely she took amiodarone and remotely she took Coumadin.  She      refuses Coumadin now despite her thromboembolic risk.   Interrogation of her pacemaker demonstrates St. Jude Affinity SR model  5130 single-chamber pacemaker.  She was set at 90 beats per minute.  The  R-waves were unavailable secondary to complete heart block.  The  histograms were fairly flat.  Pacing impedance was 595 ohms.   IMPRESSION:  1. Complete heart block.  2. Chronic atrial fibrillation.  3. Status post atrioventricular node ablation and permanent pacemaker      insertion.   DISCUSSION:  Overall, [name] [name]'s pacemaker is working normally.  Her  heart failure is presently class II to III and is really unchanged.  She  remains morbidly obese and I have encouraged her to lose weight though  this is quite difficult as she is in a  scooter because of her severe  arthritis.     [name] [name]. [name] Le, MD  Electronically Signed    GWT/MedQ  DD: 01/06/2009  DT: 01/07/2009  Job #: [id number]

## 2011-02-07 NOTE — Discharge Summary (Signed)
[name] [name], [name] [name] NO.:  [id number]   MEDICAL RECORD NO.:  [id number]          PATIENT TYPE:  INP   LOCATION:  2038                         FACILITY:  [name]   PHYSICIAN:  [name] Mungo. Lovena Le, MD    DATE OF BIRTH:  [birth year]   DATE OF ADMISSION:  07/03/2008  DATE OF DISCHARGE:  07/07/2008                               DISCHARGE SUMMARY   PRIMARY CARDIOLOGIST:  [name] Sark, MD   ELECTROPHYSIOLOGIST:  [name] Mungo. Lovena Le, MD   PRIMARY CARE [name] [name]:  [name] Adie, MD   DISCHARGE DIAGNOSIS:  Chest pain.   SECONDARY DIAGNOSES:  1. Acute on chronic diastolic congestive heart failure.  2. Atrial fibrillation status post atrioventricular nodal ablation      with St. Jude permanent pacemaker.  3. Morbid obesity.  4. Mild pulmonary artery hypertension by right heart catheterization      this admission.  5. Gout.  6. Diverticulitis.  7. Chronic cellulitis.  8. Degenerative joint disease.  9. History of cervical cancer status post hysterectomy.   ALLERGIES:  PENICILLIN, CEPHALOSPORINS, CIPRO, CODEINE, LATEX, and  PREDNISONE.   PROCEDURES:  Left heart cardiac catheterization revealing normal  coronary arteries and normal LV function.  Right heart catheterization  revealing RA of 14, PA of 36/22 (30), CWP of 16, cardiac output of 7.2,  and cardiac index of 2.8 (Fick).   HISTORY OF PRESENT ILLNESS:  A 65 year old Caucasian female with a prior  history of atrial fibrillation status post AV nodal ablation and  pacemaker placement as well as morbid obesity who presented to [name] on July 03, 2008 with a several-week history of  intermittent chest discomfort that was more or less constant in nature.  She had previously undergone a 2-day stress perfusion study which  revealed diaphragmatic and breast attenuation, although ischemia cannot  be excluded in the infra-apical and septal areas.  Secondary to ongoing  discomfort despite  equivocal Myoview, a decision was made to transfer to  [name] for further evaluation.   HOSPITAL COURSE:  The patient ruled out for MI.  We arranged for cardiac  catheterization to take place on July 06, 2008.  Over the weekend,  she continued to have mild intermittent atypical chest discomfort.  Catheterization was performed July 06, 2008 revealing normal coronary  arteries and normal LV function.  She did have a moderately elevated  right heart pressures with a PA of 36/22.  Cardiac output and index were  normal.  This morning, [name] has had no additional chest  discomfort.  We have arranged for her to be discharged today in good  condition.  She will follow up with Dr. Domenic [name] on July 21, 2008 as  previously scheduled.   DISCHARGE LABORATORY DATA:  Hemoglobin 11.5, hematocrit 33.6, WBC 5.3,  platelets 102, and MCV 95.3.  Sodium 139, potassium 3.7, chloride 107,  CO2 28, BUN 13, creatinine 0.90, and glucose 113.  INR 1.0, CK 40, MB  1.4, troponin I 0.03, and calcium 9.1.  BNP 103.0.  TSH 3.760.   DISPOSITION:  The patient is being  discharged home today in good  condition.   FOLLOWUP PLANS AND APPOINTMENTS:  We have arranged a followup with Dr.  Domenic [name] in our [name] office on July 21, 2008 at 1 p.m.  She is  scheduled to followup with Dr. Lovena [name] in March 2010.   DISCHARGE MEDICATIONS:  1. Coreg 3.125 mg b.i.d.  2. Potassium chloride 10 mEq t.i.d.  3. Lisinopril hydrochlorothiazide 10/12.5 mg half-tablet daily.  4. Aspirin 81 mg daily.  5. Hydrocodone 10/500 mg t.i.d.  6. Allopurinol 300 mg daily.  7. Colchicine 6 mg p.r.n. gout flare.  8. Lasix 20 mg daily p.r.n.  9. Cod liver oil daily.  10.B12 vitamin daily.  11.Multivitamin 1 daily.   OUTSTANDING LABORATORY STUDIES:  None.   DURATION OF DISCHARGE ENCOUNTER:  45 minutes including physician time.      [name] [name], ANP      [name] [name] Lovena Le, MD  Electronically Signed    CB/MEDQ   D:  07/07/2008  T:  07/08/2008  Job:  [id number]   cc:   [name] [name], M.D.

## 2011-02-07 NOTE — H&P (Signed)
[name] [name], [name] [name] NO.:  [id number]   MEDICAL RECORD NO.:  [id number]          PATIENT TYPE:  INP   LOCATION:  2038                         FACILITY:  [place]   PHYSICIAN:  Minus Breeding, MD, FACCDATE OF BIRTH:  23-Mar-1946   DATE OF ADMISSION:  07/03/2008  DATE OF DISCHARGE:                              HISTORY & PHYSICAL   PRIMARY CARE PHYSICIAN:  Janifer Adie, M.D.   CARDIOLOGIST:  Satira Sark, MD.   REASON FOR PRESENTATION:  Evaluate patient with chest pain.   HISTORY OF PRESENT ILLNESS:  The patient is a 65 year old white female  seen by Dr. Lovena Le for AFib and pacemaker placement.  She was recently  seen by Dr. Domenic Polite.  She has a history of chest discomfort.  She had a  catheterization by Dr. Olevia Perches in 2006 that had no coronary disease.  She  saw Dr. Domenic Polite recently and had been having chest pain for several  weeks.  This seems to be happening daily off and on.  It seems to be  constant, though it waxes and wanes in intensity.  It is substernal.  She cannot remember whether it is similar to any pain she has had  before.  It goes away on its own.  It is under her left breast.  It  sometimes radiates through to her shoulder.  Sometimes down to her arm.  It can be mild to severe.  She did have a stress perfusion study done in  2 parts this week.  However, because of her morbid obesity, it was  somewhat compromised.  There were multiple areas of defect consistent  with breast and diaphragmatic attenuation, though ischemia could not be  excluded in the infra-apical and her septal area.  Today, she had  increasing discomfort.  She was also short of breath just resting.  Her  daughter said she was diaphoretic.  She was not nauseated.  Did not have  any vomiting.  She came to [facility].  There she was not  noted to have any acute EKG changes.  Her point of care markers x1 were  negative.  Chest x-ray some showed some chronic  vascular congestion.  Her EKG is paced.  She was treated with Lasix and subsequently morphine.  She is currently pain free.  Arrangement was made with Dr. Domenic Polite to  bring her down for cardiac catheterization.   The patient is very limited in her activities.  She gets around with  some canes and usually in a scooter.  She has multiple orthopedic  problems and her morbid obesity.   PAST MEDICAL HISTORY:  1. Atrial fibrillation, AV node ablation and placement of a St. Jude's      pacemaker by Dr. Lovena Le.  There is a mention of diastolic heart      failure in the past.  2. Morbid obesity.  3. Sleep apnea on BiPAP.  4. Hypertension.  5. Diverticulitis.  6. Gout.  7. Chronic cellulitis.  8. Degenerative joint disease.  9. Degenerative disk disease.  10.Cervical cancer.   PAST SURGICAL HISTORY:  1. Treatment  of a tubal pregnancy.  2. Hysterectomy.   ALLERGIES/INTOLERANCES:  1. She is allergic to all of the PENICILLIN FAMILY.  2. CEPHALOSPORINS.  3. CIPRO.  4. CODEINE.  5. LATEX.  6. PREDNISONE.   MEDICATIONS:  1. Allopurinol 150 mg daily.  2. Aspirin 325 mg daily.  3. Hydrocodone/acetaminophen 10/500 daily.  4. Lasix 40 mg daily.  5. Lisinopril/HCT 5/6.25 daily.  6. Potassium 10 mEq daily.  7. Carvedilol 3.125 mg b.i.d.  8. Colchicine 0.6 mg p.r.n.  9. Lasix 20 mg p.r.n.  10.Multivitamin.  11.B12.   SOCIAL HISTORY:  She lives alone.  She quit smoking greater than 30  years ago.   FAMILY HISTORY:  Noncontributory for early coronary artery disease.   REVIEW OF SYSTEMS:  As stated in the HPI, negative for all other  systems.   PHYSICAL EXAMINATION:  GENERAL:  The patient is in no acute distress.  VITAL SIGNS:  Blood pressure 138/77, heart rate 89 and regular,  afebrile, weight 165.8 kg, respiratory rate 22.  HEENT:  Eyes are unremarkable.  Pupils are equal, round and reactive to  light.  Fundi not visualized.  Oral mucosa unremarkable.  NECK:  I am unable to  assess jugular veins.  Her neck is very thick.  Her carotid upstroke is brisk and symmetric.  There are no bruits.  There is no obvious thyromegaly.  LYMPHATICS:  There is no obvious cervical or axillary adenopathy.  I  would not be able to appreciate inguinal adenopathy.  LUNGS:  Clear to auscultation bilaterally.  BACK:  No costovertebral angle tenderness.  CHEST:  Well-healed pacemaker pocket.  HEART:  PMI not displaced or sustained, S1-S2 within normal limits.  No  T7-S1, no clicks, rubs, murmurs.  ABDOMEN:  Morbidly obese.  This compromised the exam.  There are normal  bowel sounds.  There is no rebound or guarding.  I would be unable to  appreciate hepatomegaly.  Her liver percusses to a normal size.  I would  be unable to appreciate a midline pulsatile mass, but there is no  obvious bruit.  SKIN:  She has lower extremity chronic venostasis changes with chronic  erythema.  There is no rashes or nodules.  EXTREMITIES:  2+ upper pulse, absent dorsalis pedis and post tibialis  bilaterally, mild bilateral chronic edema.  NEURO:  Oriented to person, place and time.  Cranial nerves II-XII are  grossly intact. Motor grossly intact.   DIAGNOSTICS:  1. EKG:  Ventricular paced rhythm.  2. Chest x-ray:  Atelectasis retrocardiac with some chronic vascular      congestion.   LABORATORY DATA:  Sodium 139, potassium 4.3, BUN 12, creatinine 0.98,  WBC 6.3, hemoglobin 13, platelets 107,000.   ASSESSMENT/PLAN:  1. Chest:  The patient's chest discomfort has some typical features      and some atypical.  Unfortunately, a Cardiolite will not be      helpful.  Given her risk factors, we do need to exclude obstructive      coronary disease.  Dr. Domenic Polite has arrange for transfer for      cardiac catheterization.  The patient understands the risk and      benefits, and will proceed with this.  I will give her beta      blocker.  I will give her heparin.  We will use nitrates if she has       recurrent pain.  2. Diastolic heart failure:  She will use Lasix as needed.  She seems  to be euvolemic at this point.  3. Atrial fibrillation:  Patient has refused Coumadin in the past.      She now has an AV ablation pacemaker.  She has routine followup of      this.  4. Hypertension:  Blood pressure is well controlled.  She will      continue the medications as list.  5. Gout:  She will continue her allopurinol.  6. Positive urinalysis:  The patient did have bacteria on her UA.  I      did not see a comment on epithelial cells.  We should repeat a      clean-catch UA.  7. Thrombocytopenia:  We will review these labs.  Her platelets in May      were actually 158,000.  She may have platelet clumping.      Minus Breeding, MD, [facility]  Electronically Signed     JH/MEDQ  D:  07/03/2008  T:  07/04/2008  Job:  [id number]   cc:   Janifer Adie, M.D.

## 2011-02-10 NOTE — Assessment & Plan Note (Signed)
[name]                         ELECTROPHYSIOLOGY OFFICE NOTE   NAME:[name], [name] READER                     MRN:          [id number]  DATE:11/14/2006                            DOB:          [birth year]    [name] [name] returns today for follow-up.  She is a very pleasant, obese,  middle-aged woman with a history of atrial fibrillation with very rapid  ventricular rates who underwent AV node ablation and pacemaker  insertion.  She has congestive heart failure.  She continues to have  morbid obesity.  She has mixed dysfunction with both systolic and  diastolic predominance, however, of diastolic dysfunction.  The patient  has at least class II heart failure symptoms, although she is more  limited by her obesity as well as degenerative joint disease exacerbated  by her obesity.  She denies syncope.  She had no other specific  complaints today except for fatigue.   PHYSICAL EXAMINATION:  GENERAL:  On exam she is a pleasant and obese  middle-aged woman in no acute distress.  VITAL SIGNS:  The blood pressure today was 110/74, the pulse was 90 and  regular, the respirations were 18, and the weight was 373 pounds.  NECK:  Jugular venous distention could not be assessed.  CARDIOVASCULAR:  Revealed regular rate and rhythm with normal S1 and S2.  The heart sounds were distant.  No murmurs could be appreciated.  LUNGS:  Clear bilaterally to auscultation.  No wheezes, rales, or  rhonchi.  EXTREMITIES:  Demonstrate trace peripheral edema bilaterally.  ABDOMEN:  Morbidly obese.   Interrogation of her pacemaker demonstrates a St. Jude Affinity with no  R-waves secondary to complete heart block.  The impedence was 604 ohms  in the ventricle with a threshold of 0.625 at 0.5.  The battery voltage  was 2.76 volts.  There were no high rate episodes.  Her ventricular rate  is set at 90 beats per minute.   IMPRESSION:  1. Complete heart block.  2. Atrial  fibrillation.  3. Congestive heart failure, predominantly diastolic dysfunction with      no ischemic heart disease.   DISCUSSION:  Overall, [name] remains stable.  I have continued to  ask her to maintain a low sodium diet and try to lose weight.  She is  fairly limited by her arthritis in her knees and hips.  I will plan to  see her back in the office in one year.  She will follow up with  transtelephonic monitoring.  If her heart failure symptoms worsen, then  we would consider upgrade to a Bi-V  pacemaker.  Otherwise we will continue to follow her with her estimated  battery longevity on her present pacemaker of 3-1/2 years.     Champ Mungo. Lovena Le, MD  Electronically Signed    GWT/MedQ  DD: 11/14/2006  DT: 11/15/2006  Job #: [id number]   cc:   [name], M.D.

## 2011-02-10 NOTE — Cardiovascular Report (Signed)
NAME:  [name] [name], [name] [name] NO.:  [id number]   MEDICAL RECORD NO.:  [id number]          PATIENT TYPE:  OIB   LOCATION:  1967                         FACILITY:  [facility]   PHYSICIAN:  Eustace Quail, M.D. [facility] DATE OF BIRTH:  1946-06-21   DATE OF PROCEDURE:  06/22/2005  DATE OF DISCHARGE:                              CARDIAC CATHETERIZATION   CLINICAL HISTORY:  Ms. Deakins is 65 years old and has had a previous  pacemaker placed by Dr. Lovena Le.  She was recently seen in consultation by  Dr. Mar Daring for symptoms of chest pain.  She also has multiple cardiac risk  factors including hypertension, sleep apnea, and excess weight.  Cardiolite  was ordered and this showed an ejection fraction of 45% with hypokinesis in  the inferior and apical walls.  There was no definite ischemia.  Because of  the abnormal scan and persistent symptoms she was scheduled for a  catheterization.   PROCEDURE:  The procedure was performed in the outpatient laboratory.  The  procedure was performed via the right femoral artery using arterial sheath  and 4-French preformed coronary catheters.  A front wall arterial puncture  was performed and Omnipaque contrast was used.  Patient tolerated the  procedure well and left the laboratory in satisfactory condition.   RESULTS:  The left ventricular pressure was 124/18 and the aortic pressure  was 124/78 with a mean of 94.   The left main coronary artery:  Free of significant disease.   Left anterior descending artery:  Gave rise to two septal perforators and a  diagonal branch.  These and the LAD proper were free of significant disease.   Circumflex artery:  Gave rise to a large marginal branch and a  posterolateral branch.  These vessels were free of significant disease.   Right coronary artery:  Moderate sized vessel.  Gave rise to two right  ventricular branches, a posterior descending, and two small posterolateral  branches.  These vessels were free  of significant disease.   LEFT VENTRICULOGRAM:  The left ventriculogram performed in the RAO  projection showed hypokinesis of the apex.  There was mild global  hypokinesis.  There appeared to be some asynchronous contractions related to  her pacemaker.  The estimated ejection fraction was 40-45%.   CONCLUSIONS:  1.  Normal coronary angiography.  2.  Mild left ventricular dysfunction of uncertain etiology possibly related      to asynchronous contractions on the pacemaker.   RECOMMENDATIONS:  Will plan reassurance.  Will plan another evaluation of  her LV function with echocardiogram which she has not had performed.  I will  arrange for her to see Dr. Verl Blalock back after the echocardiogram.           ______________________________  Eustace Quail, M.D. [facility]     BB/MEDQ  D:  06/22/2005  T:  06/22/2005  Job:  [id number]   cc:   Janifer Adie, M.D.  Fax: [place]. Wall, M.D.  [address]  [address]  [place]   [id number]   Gregg W. Lovena Le, M.D.  [place] [place] [address]   CP Lab

## 2011-02-10 NOTE — Discharge Summary (Signed)
[place]  Patient:    [name] [name]                      MRN: [id number] Adm. Date:  [id number] Disc. Date: 08/06/99 Attending:  Cristopher Peru Dictator:   Arline Asp, P.A. CC:         Ulanda Edison, M.D.                           Discharge Summary  DATE OF BIRTH:  07-12-1946  DISCHARGE DIAGNOSIS:  Atrial fibrillation with rapid ventricular response.  The  patient is status post arteriovenous nodal ablation with VVIR permanent pacemaker implantation.  HOSPITAL COURSE:  The patient is a 65 year old extremely obese white female with a long history of atrial fibrillation and CHF predominantly due to diastolic dysfunction. She was admitted on August 04, 1999 for permanent pacemaker implantation and AV nodal ablation.  The patient has had three failed cardioversions and remains with persistent atrial fibrillation.  On August 05, 1999 the patient was taken to the EP lab by Dr. Crissie Sickles.  AV nodal ablation and VVIR permanent pacemaker implantation took place.  Please see the procedure note for full details. The patient tolerated the procedure very well. On the next day, August 06, 1999 the patient is pending discharge after check on her post procedural x-ray.  DISCHARGE MEDICATIONS:  Coumadin 5 mg q.d. or as directed.  Actos 15 mg q.i.d. Methotrexate 10 mg as prescribed.  Folic acid as taken before.  Cod liver oil as taken before.  Lasix 40 mg b.i.d.  K-Dur 10 mEq q.d.  The patient will stop Amiodarone all together and the patient was instructed to  come off her Lopressor slowly.  She was taken 75 mg twice a day. She will take 25 mg b.i.d. for the next two days and then stop this all together as well.  DISCHARGE INSTRUCTIONS:  The patient was instructed to undergo no over head lifting with the left upper extremity for two weeks. She is told to adhere to a strict diabetic diet.  She was warned that if signs of infection,  bleeding, or swelling at the pacemaker site should be seen, she should call the office immediately.  The  patient was instructed to have her prothrombin time and INR checked next week at her follow up wound check appointment which she will need to call and make Monday morning. DD:  08/06/99 TD:  08/06/99 Job: [id number]

## 2011-05-07 ENCOUNTER — Encounter: Payer: Self-pay | Admitting: *Deleted

## 2011-05-07 ENCOUNTER — Inpatient Hospital Stay (HOSPITAL_COMMUNITY)
Admission: EM | Admit: 2011-05-07 | Discharge: 2011-05-09 | DRG: 292 | Disposition: A | Discharge status: Home Health | Payer: Medicare Other | Attending: Internal Medicine | Admitting: Internal Medicine

## 2011-05-07 ENCOUNTER — Emergency Department (HOSPITAL_COMMUNITY): Payer: Medicare Other

## 2011-05-07 ENCOUNTER — Other Ambulatory Visit: Payer: Self-pay

## 2011-05-07 DIAGNOSIS — I10 Essential (primary) hypertension: Secondary | ICD-10-CM | POA: Diagnosis present

## 2011-05-07 DIAGNOSIS — I5042 Chronic combined systolic (congestive) and diastolic (congestive) heart failure: Secondary | ICD-10-CM | POA: Diagnosis present

## 2011-05-07 DIAGNOSIS — G4733 Obstructive sleep apnea (adult) (pediatric): Secondary | ICD-10-CM | POA: Diagnosis present

## 2011-05-07 DIAGNOSIS — I509 Heart failure, unspecified: Secondary | ICD-10-CM

## 2011-05-07 DIAGNOSIS — I059 Rheumatic mitral valve disease, unspecified: Secondary | ICD-10-CM | POA: Diagnosis present

## 2011-05-07 DIAGNOSIS — I5023 Acute on chronic systolic (congestive) heart failure: Principal | ICD-10-CM | POA: Diagnosis present

## 2011-05-07 DIAGNOSIS — Z950 Presence of cardiac pacemaker: Secondary | ICD-10-CM

## 2011-05-07 DIAGNOSIS — N289 Disorder of kidney and ureter, unspecified: Secondary | ICD-10-CM | POA: Diagnosis present

## 2011-05-07 DIAGNOSIS — Z6841 Body Mass Index (BMI) 40.0 and over, adult: Secondary | ICD-10-CM

## 2011-05-07 HISTORY — DX: Gastro-esophageal reflux disease without esophagitis: K21.9

## 2011-05-07 HISTORY — DX: Cellulitis, unspecified: L03.90

## 2011-05-07 HISTORY — DX: Sleep apnea, unspecified: G47.30

## 2011-05-07 HISTORY — DX: Unspecified atrial fibrillation: I48.91

## 2011-05-07 HISTORY — DX: Unspecified osteoarthritis, unspecified site: M19.90

## 2011-05-07 HISTORY — DX: Morbid (severe) obesity due to excess calories: E66.01

## 2011-05-07 HISTORY — DX: Calculus of kidney: N20.0

## 2011-05-07 HISTORY — DX: Anemia, unspecified: D64.9

## 2011-05-07 HISTORY — DX: Diverticulitis of intestine, part unspecified, without perforation or abscess without bleeding: K57.92

## 2011-05-07 LAB — CBC
HCT: 35.8 % — ABNORMAL LOW (ref 36.0–46.0)
MCH: 31.2 pg (ref 26.0–34.0)
MCV: 94.7 fL (ref 78.0–100.0)
RBC: 3.78 MIL/uL — ABNORMAL LOW (ref 3.87–5.11)
RDW: 15 % (ref 11.5–15.5)
WBC: 6.8 10*3/uL (ref 4.0–10.5)

## 2011-05-07 LAB — URINE MICROSCOPIC-ADD ON

## 2011-05-07 LAB — DIFFERENTIAL
Eosinophils Relative: 1 % (ref 0–5)
Lymphocytes Relative: 18 % (ref 12–46)
Lymphs Abs: 1.2 10*3/uL (ref 0.7–4.0)
Monocytes Absolute: 0.5 10*3/uL (ref 0.1–1.0)

## 2011-05-07 LAB — URINALYSIS, ROUTINE W REFLEX MICROSCOPIC
Bilirubin Urine: NEGATIVE
Glucose, UA: NEGATIVE mg/dL
Ketones, ur: NEGATIVE mg/dL
pH: 5.5 (ref 5.0–8.0)

## 2011-05-07 LAB — PRO B NATRIURETIC PEPTIDE: Pro B Natriuretic peptide (BNP): 5617 pg/mL — ABNORMAL HIGH (ref 0–125)

## 2011-05-07 LAB — BASIC METABOLIC PANEL
CO2: 23 mEq/L (ref 19–32)
Calcium: 10 mg/dL (ref 8.4–10.5)
Creatinine, Ser: 1.33 mg/dL — ABNORMAL HIGH (ref 0.50–1.10)
GFR calc non Af Amer: 40 mL/min — ABNORMAL LOW (ref >60)

## 2011-05-07 LAB — CARDIAC PANEL(CRET KIN+CKTOT+MB+TROPI): Total CK: 37 U/L (ref 7–177)

## 2011-05-07 MED ORDER — ALLOPURINOL 300 MG PO TABS
300.0000 mg | ORAL_TABLET | Freq: Every day | ORAL | Status: DC
  Administered 2011-05-08: 300 mg via ORAL
  Filled 2011-05-07: qty 1

## 2011-05-07 MED ORDER — PANTOPRAZOLE SODIUM 40 MG PO TBEC
40.0000 mg | DELAYED_RELEASE_TABLET | Freq: Every day | ORAL | Status: DC
  Administered 2011-05-08 (×2): 40 mg via ORAL
  Filled 2011-05-07 (×2): qty 1

## 2011-05-07 MED ORDER — FUROSEMIDE 10 MG/ML IJ SOLN
40.0000 mg | Freq: Once | INTRAMUSCULAR | Status: AC
  Administered 2011-05-07: 40 mg via INTRAVENOUS
  Filled 2011-05-07: qty 4

## 2011-05-07 MED ORDER — SODIUM CHLORIDE 0.9 % IJ SOLN
3.0000 mL | Freq: Two times a day (BID) | INTRAMUSCULAR | Status: DC
  Administered 2011-05-08: 3 mL via INTRAVENOUS
  Filled 2011-05-07 (×2): qty 3

## 2011-05-07 MED ORDER — ENOXAPARIN SODIUM 40 MG/0.4ML ~~LOC~~ SOLN
40.0000 mg | Freq: Every day | SUBCUTANEOUS | Status: DC
  Administered 2011-05-08: 40 mg via SUBCUTANEOUS
  Filled 2011-05-07 (×2): qty 0.4

## 2011-05-07 MED ORDER — SODIUM CHLORIDE 0.9 % IV SOLN: 250.0000 mL | INTRAVENOUS | Status: DC

## 2011-05-07 MED ORDER — COLCHICINE-PROBENECID 0.5-500 MG PO TABS
1.0000 | ORAL_TABLET | Freq: Two times a day (BID) | ORAL | Status: DC
  Filled 2011-05-07 (×6): qty 1

## 2011-05-07 MED ORDER — SODIUM CHLORIDE 0.9 % IV SOLN
INTRAVENOUS | Status: AC
  Administered 2011-05-07: via INTRAVENOUS

## 2011-05-07 MED ORDER — ACETAMINOPHEN 325 MG PO TABS: 650.0000 mg | ORAL_TABLET | ORAL | Status: DC | PRN

## 2011-05-07 MED ORDER — LISINOPRIL 10 MG PO TABS
10.0000 mg | ORAL_TABLET | Freq: Every day | ORAL | Status: DC
  Administered 2011-05-08: 10 mg via ORAL
  Filled 2011-05-07: qty 1

## 2011-05-07 MED ORDER — FUROSEMIDE 10 MG/ML IJ SOLN
40.0000 mg | Freq: Two times a day (BID) | INTRAMUSCULAR | Status: DC
  Administered 2011-05-08 (×3): 40 mg via INTRAVENOUS
  Filled 2011-05-07 (×4): qty 4

## 2011-05-07 MED ORDER — ONDANSETRON HCL 4 MG/2ML IJ SOLN: 4.0000 mg | INTRAMUSCULAR | Status: DC | PRN

## 2011-05-07 MED ORDER — HYDROCODONE-ACETAMINOPHEN 10-325 MG PO TABS
1.0000 | ORAL_TABLET | ORAL | Status: DC | PRN
  Administered 2011-05-08 (×4): 1 via ORAL
  Filled 2011-05-07 (×4): qty 1

## 2011-05-07 MED ORDER — POTASSIUM CHLORIDE CRYS ER 20 MEQ PO TBCR
20.0000 meq | EXTENDED_RELEASE_TABLET | Freq: Three times a day (TID) | ORAL | Status: DC
  Administered 2011-05-08 (×4): 20 meq via ORAL
  Filled 2011-05-07 (×4): qty 1

## 2011-05-07 MED ORDER — SODIUM CHLORIDE 0.9 % IJ SOLN: 3.0000 mL | INTRAMUSCULAR | Status: DC | PRN

## 2011-05-07 MED ORDER — ASPIRIN 325 MG PO TABS
325.0000 mg | ORAL_TABLET | Freq: Every day | ORAL | Status: DC
  Administered 2011-05-08: 325 mg via ORAL
  Filled 2011-05-07: qty 1

## 2011-05-07 NOTE — ED Notes (Signed)
Pt arrived via ems with c/o sob that started about 3 weeks ago, has gotten worse over the past week, esp.  at night, pt states that her bipap does not work as well over the past week,  C/o chest tightness that started 2-3 days ago, intermittent, c/o bilateral hip and leg pain that is chronic for pt, pt also states that she has sharp pains in her head but has not had any for two weeks, admits to nausea

## 2011-05-07 NOTE — ED Notes (Signed)
Pt has swelling to bilateral lower extremities, redness noted as well, pt states that the swelling is not any worse than normal for her,

## 2011-05-07 NOTE — ED Provider Notes (Signed)
History     CSN: [id number] Arrival date & time: 05/07/2011  5:37 PM  Chief Complaint  Patient presents with  . Shortness of Breath  . Chest Pain   HPI Pt was seen at 1835.  Per pt, c/o gradual onset and worsening of persistent SOB x3 weeks, worse over the past several days.  States her SOB becomes worse qhs and on exertion.  Has been using her home CPAP qhs without relief.  Denies CP/palpitations, no cough, no abd pain, no N/V/D, no fevers.  Denies any change in her usual chronic bilat LE's edema.     Past Medical History  Diagnosis Date  . CHF (congestive heart failure)   . Atrial fibrillation   . Hypertension   . Bursitis   . GERD (gastroesophageal reflux disease)   . Cancer   . Cataract   . Gallstones   . Kidney stones   . Sleep apnea   . CPAP/BiPAP dependent     at night   . Morbid obesity   . Chronic cellulitis   . Degenerative joint disease   . Gout   . Diverticulitis     Past Surgical History  Procedure Date  . Abdominal hysterectomy   . Pacemaker insertion   . Eye surgery   . Kidney stone surgery     History reviewed. No pertinent family history.  History  Substance Use Topics  . Smoking status: Former Research scientist (life sciences)  . Smokeless tobacco: Not on file  . Alcohol Use:     OB History    Grav Para Term Preterm Abortions TAB SAB Ect Mult Living                  Review of Systems ROS: Statement: All systems negative except as marked or noted in the HPI; Constitutional: Negative for fever and chills. ; ; Eyes: Negative for eye pain and discharge. ; ; ENMT: Negative for ear pain, hoarseness, nasal congestion, sinus pressure and sore throat. ; ; Cardiovascular: +SOB, DOE.  Negative for chest pain, palpitations, diaphoresis, and peripheral edema. ; ; Respiratory: Negative for cough, wheezing and stridor. ; ; Gastrointestinal: Negative for nausea, vomiting, diarrhea and abdominal pain. ; ; Genitourinary: Negative for dysuria, flank pain and hematuria. ; ;  Musculoskeletal: Negative for back pain and neck pain. ; ; Skin: Negative for rash and skin lesion. ; ; Neuro: Negative for headache, lightheadedness and neck stiffness. ;    Physical Exam  BP 140/83  Pulse 91  Temp(Src) 97.9 F (36.6 C) (Oral)  Resp 22  Ht 5' 7"  (1.702 m)  Wt 336 lb (152.409 kg)  BMI 52.63 kg/m2  SpO2 96% BP 149/81  Pulse 93  Temp(Src) 97.9 F (36.6 C) (Oral)  Resp 25  Ht 5' 7"  (1.702 m)  Wt 336 lb (152.409 kg)  BMI 52.63 kg/m2  SpO2 97%   Physical Exam 1840: Physical examination:  Nursing notes reviewed; Vital signs and O2 SAT reviewed;  Constitutional: Well developed, Well nourished, Well hydrated, In no acute distress; Head:  Normocephalic, atraumatic; Eyes: EOMI, PERRL, No scleral icterus; ENMT: Mouth and pharynx normal, Mucous membranes moist; Neck: Supple, Full range of motion, No lymphadenopathy; Cardiovascular: Regular rate and rhythm, No murmur, rub, or gallop; Respiratory: Breath sounds coarse bilaterally, No wheezes, Normal respiratory effort/excursion; Speaking full sentences.  Chest: Nontender, Movement normal; Abdomen: Soft, Nontender, Nondistended, Normal bowel sounds; Genitourinary: No CVA tenderness; Extremities: Pulses normal, No tenderness, 2+ pedal edema with chronic stasis changes bilat, No calf asymmetry.; Neuro:  AA&Ox3, Major CN grossly intact.  No gross focal motor or sensory deficits in extremities.; Skin: Color normal, Warm, Dry.   ED Course  Procedures  MDM MDM Reviewed: previous chart, nursing note and vitals Reviewed previous: ECG Interpretation: labs, ECG and x-ray    Date: 05/07/2011  Rate: 90  Rhythm: normal sinus rhythm  QRS Axis: left  Intervals: normal  ST/T Wave abnormalities: normal  Conduction Disutrbances:nonspecific intraventricular conduction delay  Narrative Interpretation:   Old EKG Reviewed: unchanged wide QRS rhythm from previous EKG 11/28/10  Results for orders placed during the hospital encounter of  36/64/40  BASIC METABOLIC PANEL      Component Value Range   Sodium 136  135 - 145 (mEq/L)   Potassium 4.3  3.5 - 5.1 (mEq/L)   Chloride 100  96 - 112 (mEq/L)   CO2 23  19 - 32 (mEq/L)   Glucose, Bld 116 (*) 70 - 99 (mg/dL)   BUN 25 (*) 6 - 23 (mg/dL)   Creatinine, Ser 1.33 (*) 0.50 - 1.10 (mg/dL)   Calcium 10.0  8.4 - 10.5 (mg/dL)   GFR calc non Af Amer 40 (*) >60 (mL/min)   GFR calc Af Amer 48 (*) >60 (mL/min)  CARDIAC PANEL(CRET KIN+CKTOT+MB+TROPI)      Component Value Range   Total CK 37  7 - 177 (U/L)   CK, MB 2.5  0.3 - 4.0 (ng/mL)   Troponin I <0.30  <0.30 (ng/mL)   Relative Index RELATIVE INDEX IS INVALID  0.0 - 2.5   CBC      Component Value Range   WBC 6.8  4.0 - 10.5 (K/uL)   RBC 3.78 (*) 3.87 - 5.11 (MIL/uL)   Hemoglobin 11.8 (*) 12.0 - 15.0 (g/dL)   HCT 35.8 (*) 36.0 - 46.0 (%)   MCV 94.7  78.0 - 100.0 (fL)   MCH 31.2  26.0 - 34.0 (pg)   MCHC 33.0  30.0 - 36.0 (g/dL)   RDW 15.0  11.5 - 15.5 (%)   Platelets 155  150 - 400 (K/uL)  DIFFERENTIAL      Component Value Range   Neutrophils Relative 74  43 - 77 (%)   Neutro Abs 5.0  1.7 - 7.7 (K/uL)   Lymphocytes Relative 18  12 - 46 (%)   Lymphs Abs 1.2  0.7 - 4.0 (K/uL)   Monocytes Relative 7  3 - 12 (%)   Monocytes Absolute 0.5  0.1 - 1.0 (K/uL)   Eosinophils Relative 1  0 - 5 (%)   Eosinophils Absolute 0.0  0.0 - 0.7 (K/uL)   Basophils Relative 0  0 - 1 (%)   Basophils Absolute 0.0  0.0 - 0.1 (K/uL)  URINALYSIS, ROUTINE W REFLEX MICROSCOPIC      Component Value Range   Color, Urine YELLOW  YELLOW    Appearance CLEAR  CLEAR    Specific Gravity, Urine 1.020  1.005 - 1.030    pH 5.5  5.0 - 8.0    Glucose, UA NEGATIVE  NEGATIVE (mg/dL)   Hgb urine dipstick NEGATIVE  NEGATIVE    Bilirubin Urine NEGATIVE  NEGATIVE    Ketones, ur NEGATIVE  NEGATIVE (mg/dL)   Protein, ur NEGATIVE  NEGATIVE (mg/dL)   Urobilinogen, UA 0.2  0.0 - 1.0 (mg/dL)   Nitrite NEGATIVE  NEGATIVE    Leukocytes, UA MODERATE (*) NEGATIVE     PRO B NATRIURETIC PEPTIDE      Component Value Range   BNP, POC 5617.0 (*)  0 - 125 (pg/mL)  URINE MICROSCOPIC-ADD ON      Component Value Range   Squamous Epithelial / LPF MANY (*) RARE    WBC, UA 21-50  <3 (WBC/hpf)   Bacteria, UA MANY (*) RARE    Dg Chest Portable 1 View  05/07/2011  *RADIOLOGY REPORT*  Clinical Data: Short of breath.  Obesity.  PORTABLE CHEST - 1 VIEW  Comparison: 01/02/2011.  Findings: Left subclavian cardiac pacemaker.  Severe enlargement of the cardiopericardial silhouette. Pericardial effusion is not excluded.  Pulmonary vascular congestion is present with interstitial pulmonary edema.  Suboptimal visualization of the lung bases secondary to body habitus.  There may be a right pleural effusion.  Trachea midline.  IMPRESSION:  1.  Marked enlargement of the cardiopericardial silhouette likely representing cardiomegaly. 2.  CHF.  Original Report Authenticated By: Dereck Ligas, M.D.   Results for TYSHELL, RAMBERG (MRN [id number]) as of 05/07/2011 20:26  Ref. Range 01/17/2011 05:13 05/07/2011 17:48 05/07/2011 19:01 05/07/2011 19:02 05/07/2011 19:43  Creat Latest Range: 0.50-1.10 mg/dL 1.51 (H)  1.33 (H)      Results for ROENA, SASSAMAN (MRN [id number]) as of 05/07/2011 20:26  Ref. Range 01/17/2011 05:13 05/07/2011 17:48 05/07/2011 19:01 05/07/2011 19:02 05/07/2011 19:43  HGB Latest Range: 12.0-15.0 g/dL 10.2 (L)  11.8 (L)    HCT Latest Range: 36.0-46.0 % 30.9 (L)  35.8 (L)     Results for LATASH, NOURI (MRN [id number]) as of 05/07/2011 20:26  Ref. Range 11/26/2010 08:50 11/26/2010 09:45 11/26/2010 10:07 11/27/2010 01:00 11/27/2010 15:58 11/27/2010 17:00 12/02/2010 05:20 05/07/2011 19:02 05/07/2011 19:43  BNP, POC Latest Range: 0-125 pg/mL  193.0 (H)  284.0 (H)  283.0 (H) 413.0 (H) 5617.0 (H)      8:29 PM:  Contaminated Udip, UC pending.  H/H, BUN/Cr appear per baseline.  BNP elevated today with CHF on CXR.  Pt attempted to ambulate with r/a pulse ox remaining at 95% but pt c/o increasing  SOB, wheezing, chest tightness, as well as near syncope.  Was only able to ambulate a few feet before she needed to sit down in chair.  Will dose lasix IV.  Dx testing d/w pt and family.  Questions answered.  Verb understanding, agreeable to admit.   9:00 PM:  T/C to [facility] Dr. Megan Salon, case discussed, including:  HPI, pertinent PM/SHx, VS/PE, dx testing, ED course and treatment.  Agreeable to admit.  Requests tele bed to team AP2.   [place], DO 05/07/11 2131

## 2011-05-07 NOTE — ED Notes (Signed)
See triage note.

## 2011-05-08 ENCOUNTER — Encounter (HOSPITAL_COMMUNITY): Payer: Self-pay | Admitting: Internal Medicine

## 2011-05-08 DIAGNOSIS — I059 Rheumatic mitral valve disease, unspecified: Secondary | ICD-10-CM

## 2011-05-08 LAB — BASIC METABOLIC PANEL
BUN: 27 mg/dL — ABNORMAL HIGH (ref 6–23)
CO2: 23 mEq/L (ref 19–32)
Calcium: 9.7 mg/dL (ref 8.4–10.5)
Chloride: 103 mEq/L (ref 96–112)
Creatinine, Ser: 1.2 mg/dL — ABNORMAL HIGH (ref 0.50–1.10)
GFR calc Af Amer: 54 mL/min — ABNORMAL LOW (ref >60)

## 2011-05-08 LAB — MAGNESIUM: Magnesium: 2 mg/dL (ref 1.5–2.5)

## 2011-05-08 LAB — MRSA PCR SCREENING: MRSA by PCR: NEGATIVE

## 2011-05-08 LAB — TSH: TSH: 2.784 u[IU]/mL (ref 0.350–4.500)

## 2011-05-08 MED ORDER — ENOXAPARIN SODIUM 80 MG/0.8ML ~~LOC~~ SOLN: 80.0000 mg | Freq: Every day | SUBCUTANEOUS | Status: DC

## 2011-05-08 MED ORDER — NYSTATIN 100000 UNIT/GM EX POWD
Freq: Three times a day (TID) | CUTANEOUS | Status: DC
  Filled 2011-05-08: qty 15

## 2011-05-08 NOTE — H&P (Signed)
PCP:   [name], MD   Pulmonologist: Kara Mead, MD  Cardiologist: Crissie Sickles, MD  Chief Complaint:  Progressive SOB x 3 weeks  HPI: 65 year old morbidly obese Caucasian lady history of obstructive sleep apnea on home BiPAP settings 25/85, also systolic diastolic dysfunction with ejection fraction of 30-35% in 2009. She has a history of A. fib status post nodal ablation and pacemaker placement; she has moderate mitral regurgitation. Mobility is limited by her morbid obesity and gouty arthropathy, as well as osteoarthritis. She gets episodic Cortizone injections into her joints by Dr. Charlestine Night. 6 months ago she was hospitalized for 2 weeks, for sepsis arising from the urinary tract and required mechanical ventilation.   She has been slowly recovering from that hospitalization, but her exercise capacity is limited by her multiple problems. He does not have a scale to weigh herself and noticed that her legs and body is been getting increasingly puffy, and for the past 3 weeks she's been having progressive shortness of breath despite the use of BiPAP, and the past 2 days she was having a central chest pressure as if someone has their hand on her chest.   Denies palpitations diaphoresis or syncope. Does not attend heart failure clinic.  Patient was evaluated in the emergency room and was found to have a markedly elevated beta-type natruretic peptide compared to her baseline, her chest x-ray suggests congestive heart failure and the hospitalist service was called to assist in management.    Review of Systems:  The patient denies anorexia, fever, weight loss,, vision loss, decreased hearing, hoarseness, chest pain, syncope, dyspnea on exertion, peripheral edema, balance deficits, hemoptysis, abdominal pain, melena, hematochezia, severe indigestion/heartburn, hematuria, incontinence, genital sores, muscle weakness, suspicious skin lesions, transient blindness, difficulty walking, depression,  unusual weight change, abnormal bleeding, enlarged lymph nodes, angioedema, and breast masses.  Past Medical History: Past Medical History  Diagnosis Date  . CHF (congestive heart failure)   . Atrial fibrillation   . Hypertension   . Bursitis   . GERD (gastroesophageal reflux disease)   . Cancer   . Cataract   . Gallstones   . Kidney stones   . Sleep apnea   . CPAP/BiPAP dependent     at night   . Morbid obesity   . Chronic cellulitis   . Degenerative joint disease   . Gout   . Diverticulitis   . Blood transfusion   . Anemia    Past Surgical History  Procedure Date  . Abdominal hysterectomy   . Pacemaker insertion   . Eye surgery   . Kidney stone surgery     Medications: Prior to Admission medications   Medication Sig Start Date End Date Taking? Authorizing Provider  allopurinol (ZYLOPRIM) 300 MG tablet Take 300 mg by mouth daily.     Yes Historical Provider, MD  aspirin 325 MG tablet Take 325 mg by mouth daily.     Yes Historical Provider, MD  COD LIVER OIL PO Take 1 tablet by mouth daily.     Yes Historical Provider, MD  colchicine-probenecid 0.5-500 MG per tablet Take 1 tablet by mouth 2 (two) times daily.     Yes Historical Provider, MD  Cyanocobalamin (VITAMIN B-12 PO) Take 1 tablet by mouth daily.     Yes Historical Provider, MD  furosemide (LASIX) 20 MG tablet Take 20 mg by mouth daily.     Yes Historical Provider, MD  HYDROcodone-acetaminophen (LORTAB) 10-500 MG per tablet Take 1 tablet by mouth 3 (three) times daily.  Yes Historical Provider, MD  lisinopril (PRINIVIL,ZESTRIL) 10 MG tablet Take 10 mg by mouth daily.     Yes Historical Provider, MD  Multiple Vitamin (MULTIVITAMIN) tablet Take 1 tablet by mouth daily.     Yes Historical Provider, MD  omeprazole (PRILOSEC) 20 MG capsule Take 20 mg by mouth daily.     Yes Historical Provider, MD  potassium chloride SA (K-DUR,KLOR-CON) 20 MEQ tablet Take 20 mEq by mouth 3 (three) times daily.     Yes Historical  Provider, MD    Allergies:   Allergies  Allergen Reactions  . Beta Adrenergic Blockers     REACTION: bradycardia  . Ciprofloxacin     REACTION: hives  . Codeine     REACTION: throat swelling  . Latex     REACTION: rash, itching  . Penicillins     REACTION: hives  . Peppermint Oil   . Prednisone     REACTION: swelling, S.O.B.    Social History:  reports that she has quit smoking. She does not have any smokeless tobacco history on file. She reports that she does not drink alcohol or use illicit drugs.  Family History: History reviewed. No pertinent family history.  Physical Exam: Filed Vitals:   05/07/11 2017 05/07/11 2030 05/07/11 2138 05/07/11 2240  BP: 149/81  140/75 157/101  Pulse: 93 98 100 90  Temp:   97.8 F (36.6 C) 97.7 F (36.5 C)  TempSrc:   Oral   Resp: 25 22 23 24   Height:    5' 7"  (1.702 m)  Weight:    162.751 kg (358 lb 12.8 oz)  SpO2: 97% 98% 100% 96%   General appearance: alert, cooperative, no distress and morbidly obese Eyes: conjunctivae/corneas clear. PERRL, EOM's intact. Neck: no adenopathy, no carotid bruit,  thyroid not enlarged, symmetric, no tenderness/mass/nodules, but massive neck, makes exam difficult. Back: symmetric, no curvature. ROM normal. No CVA tenderness. Resp: clear to auscultation bilaterally Chest wall: no tenderness GI: soft, non-tender; bowel sounds normal; no masses,  no organomegaly and morbidly obese Extremities: edema 2+ bilaterally and venous stasis dermatitis noted Pulses: 2+ and symmetric Skin: chrnoic vernous stacis changes both legs; intertriginous candida in the fold of the pannus Neurologic: Grossly normal   Labs on Admission:   [facility] 05/07/11 1901  NA 136  K 4.3  CL 100  CO2 23  GLUCOSE 116*  BUN 25*  CREATININE 1.33*  CALCIUM 10.0  MG --  PHOS --   No results found for this basename: AST:2,ALT:2,ALKPHOS:2,BILITOT:2,PROT:2,ALBUMIN:2 in the last 72 hours No results found for this basename:  LIPASE:2,AMYLASE:2 in the last 72 hours  Basename 05/07/11 1901  WBC 6.8  NEUTROABS 5.0  HGB 11.8*  HCT 35.8*  MCV 94.7  PLT 155    Basename 05/07/11 1901  CKTOTAL 37  CKMB 2.5  CKMBINDEX --  TROPONINI <0.30   BNP >5000   Radiological Exams on Admission: Dg Chest Portable 1 View  05/07/2011  *RADIOLOGY REPORT*  Clinical Data: Short of breath.  Obesity.  PORTABLE CHEST - 1 VIEW  Comparison: 01/02/2011.  Findings: Left subclavian cardiac pacemaker.  Severe enlargement of the cardiopericardial silhouette. Pericardial effusion is not excluded.  Pulmonary vascular congestion is present with interstitial pulmonary edema.  Suboptimal visualization of the lung bases secondary to body habitus.  There may be a right pleural effusion.  Trachea midline.  IMPRESSION:  1.  Marked enlargement of the cardiopericardial silhouette likely representing cardiomegaly. 2.  CHF.  Original Report Authenticated By: Dereck Ligas,  M.D.    Assessment/Plan Present on Admission:   .CHF exacerbation: Will reinitiate dietary and fluid restrictions, start intravenous diuretics, continue potassium supplements, consult a cardiologist for assistance in management, consult case management for outpatient heart failure followup, and getting assistance with daily weights.  .OBSTRUCTIVE SLEEP APNEA: Continue BiPAP at home settings, but titrated by respiratory therapist.   .HYPERTENSION: Continue home medications.   .Morbid obesity: Encourage dietary restriction, discussed various exercise option including water aerobics  Other plans as per orders.  [name] [name] 05/08/2011, 12:06 AM

## 2011-05-08 NOTE — Progress Notes (Signed)
*  PRELIMINARY RESULTS* Echocardiogram 2D Echocardiogram has been performed.  [name] [name] 05/08/2011, 2:44 PM

## 2011-05-08 NOTE — Progress Notes (Signed)
Subjective: This lady was admitted yesterday with congestive heart failure, likely systolic. She does have a echocardiogram in 2009 which demonstrated ejection fraction of 30-35% only. She is due to have another echocardiogram on this admission. She does admit to not taking her 20 mg Lasix orally every day because she says that she has to go to the bathroom and when she is going out of the house she tends to not take it. Since she has been on intravenous Lasix and from yesterday, she feels much improved but not back to her baseline. There is no chest pain.           Physical Exam: Blood pressure 142/85, pulse 107, temperature 97.7 F (36.5 C), temperature source Oral, resp. rate 21, height 5' 7"  (1.702 m), weight 157.9 kg (348 lb 1.7 oz), SpO2 95.00%. She is morbidly obese. Heart sounds are present and in atrial fibrillation. Lung fields are largely clear with reduced breath sounds at the left base. There are no significant crackles or wheezes.   Investigations: Results for orders placed during the hospital encounter of 05/07/11 (from the past 48 hour(s))  BASIC METABOLIC PANEL     Status: Abnormal   Collection Time   05/07/11  7:01 PM      Component Value Range Comment   Sodium 136  135 - 145 (mEq/L)    Potassium 4.3  3.5 - 5.1 (mEq/L)    Chloride 100  96 - 112 (mEq/L)    CO2 23  19 - 32 (mEq/L)    Glucose, Bld 116 (*) 70 - 99 (mg/dL)    BUN 25 (*) 6 - 23 (mg/dL)    Creatinine, Ser 1.33 (*) 0.50 - 1.10 (mg/dL)    Calcium 10.0  8.4 - 10.5 (mg/dL)    GFR calc non Af Amer 40 (*) >60 (mL/min)    GFR calc Af Amer 48 (*) >60 (mL/min)   CARDIAC PANEL(CRET KIN+CKTOT+MB+TROPI)     Status: Normal   Collection Time   05/07/11  7:01 PM      Component Value Range Comment   Total CK 37  7 - 177 (U/L)    CK, MB 2.5  0.3 - 4.0 (ng/mL)    Troponin I <0.30  <0.30 (ng/mL)    Relative Index RELATIVE INDEX IS INVALID  0.0 - 2.5    CBC     Status: Abnormal   Collection Time   05/07/11  7:01 PM      Component Value Range Comment   WBC 6.8  4.0 - 10.5 (K/uL)    RBC 3.78 (*) 3.87 - 5.11 (MIL/uL)    Hemoglobin 11.8 (*) 12.0 - 15.0 (g/dL)    HCT 35.8 (*) 36.0 - 46.0 (%)    MCV 94.7  78.0 - 100.0 (fL)    MCH 31.2  26.0 - 34.0 (pg)    MCHC 33.0  30.0 - 36.0 (g/dL)    RDW 15.0  11.5 - 15.5 (%)    Platelets 155  150 - 400 (K/uL)   DIFFERENTIAL     Status: Normal   Collection Time   05/07/11  7:01 PM      Component Value Range Comment   Neutrophils Relative 74  43 - 77 (%)    Neutro Abs 5.0  1.7 - 7.7 (K/uL)    Lymphocytes Relative 18  12 - 46 (%)    Lymphs Abs 1.2  0.7 - 4.0 (K/uL)    Monocytes Relative 7  3 - 12 (%)  Monocytes Absolute 0.5  0.1 - 1.0 (K/uL)    Eosinophils Relative 1  0 - 5 (%)    Eosinophils Absolute 0.0  0.0 - 0.7 (K/uL)    Basophils Relative 0  0 - 1 (%)    Basophils Absolute 0.0  0.0 - 0.1 (K/uL)   PRO B NATRIURETIC PEPTIDE     Status: Abnormal   Collection Time   05/07/11  7:02 PM      Component Value Range Comment   BNP, POC 5617.0 (*) 0 - 125 (pg/mL)   URINALYSIS, ROUTINE W REFLEX MICROSCOPIC     Status: Abnormal   Collection Time   05/07/11  7:43 PM      Component Value Range Comment   Color, Urine YELLOW  YELLOW     Appearance CLEAR  CLEAR     Specific Gravity, Urine 1.020  1.005 - 1.030     pH 5.5  5.0 - 8.0     Glucose, UA NEGATIVE  NEGATIVE (mg/dL)    Hgb urine dipstick NEGATIVE  NEGATIVE     Bilirubin Urine NEGATIVE  NEGATIVE     Ketones, ur NEGATIVE  NEGATIVE (mg/dL)    Protein, ur NEGATIVE  NEGATIVE (mg/dL)    Urobilinogen, UA 0.2  0.0 - 1.0 (mg/dL)    Nitrite NEGATIVE  NEGATIVE     Leukocytes, UA MODERATE (*) NEGATIVE    URINE MICROSCOPIC-ADD ON     Status: Abnormal   Collection Time   05/07/11  7:43 PM      Component Value Range Comment   Squamous Epithelial / LPF MANY (*) RARE     WBC, UA 21-50  <3 (WBC/hpf)    Bacteria, UA MANY (*) RARE    MRSA PCR SCREENING     Status: Normal   Collection Time   05/08/11  4:36 AM      Component  Value Range Comment   MRSA by PCR NEGATIVE  NEGATIVE    MAGNESIUM     Status: Normal   Collection Time   05/08/11  4:41 AM      Component Value Range Comment   Magnesium 2.0  1.5 - 2.5 (mg/dL)   BASIC METABOLIC PANEL     Status: Abnormal   Collection Time   05/08/11  4:41 AM      Component Value Range Comment   Sodium 141  135 - 145 (mEq/L)    Potassium 4.0  3.5 - 5.1 (mEq/L)    Chloride 103  96 - 112 (mEq/L)    CO2 23  19 - 32 (mEq/L)    Glucose, Bld 102 (*) 70 - 99 (mg/dL)    BUN 27 (*) 6 - 23 (mg/dL)    Creatinine, Ser 1.20 (*) 0.50 - 1.10 (mg/dL)    Calcium 9.7  8.4 - 10.5 (mg/dL)    GFR calc non Af Amer 45 (*) >60 (mL/min)    GFR calc Af Amer 54 (*) >60 (mL/min)    Recent Results (from the past 240 hour(s))  MRSA PCR SCREENING     Status: Normal   Collection Time   05/08/11  4:36 AM      Component Value Range Status Comment   MRSA by PCR NEGATIVE  NEGATIVE  Final     Dg Chest Portable 1 View  05/07/2011  *RADIOLOGY REPORT*  Clinical Data: Short of breath.  Obesity.  PORTABLE CHEST - 1 VIEW  Comparison: 01/02/2011.  Findings: Left subclavian cardiac pacemaker.  Severe enlargement of the cardiopericardial silhouette.  Pericardial effusion is not excluded.  Pulmonary vascular congestion is present with interstitial pulmonary edema.  Suboptimal visualization of the lung bases secondary to body habitus.  There may be a right pleural effusion.  Trachea midline.  IMPRESSION:  1.  Marked enlargement of the cardiopericardial silhouette likely representing cardiomegaly. 2.  CHF.  Original Report Authenticated By: Dereck Ligas, M.D.      Medications: I have reviewed the patient's current medications.  Impression: 1. Acute systolic congestive heart failure, improving. 2. Obstructive sleep apnea. 3. Chronic atrial fibrillation. She has been offered anticoagulation with Coumadin but has refused and has been taking aspirin. 4. Hypertension. 5. Morbid obesity.     Plan: 1. Continue  Lasix 40 mg IV twice a day. 2. Possible discharge tomorrow if she continues to improve.     LOS: 1 day   Susank C 05/08/2011, 9:28 AM

## 2011-05-09 LAB — URINE CULTURE
Colony Count: NO GROWTH
Culture  Setup Time: 201208140054
Culture: NO GROWTH

## 2011-05-09 LAB — CBC
HCT: 35.2 % — ABNORMAL LOW (ref 36.0–46.0)
Hemoglobin: 11.3 g/dL — ABNORMAL LOW (ref 12.0–15.0)
MCH: 30.4 pg (ref 26.0–34.0)
MCHC: 32.1 g/dL (ref 30.0–36.0)
RDW: 15.1 % (ref 11.5–15.5)

## 2011-05-09 LAB — COMPREHENSIVE METABOLIC PANEL
Albumin: 3.3 g/dL — ABNORMAL LOW (ref 3.5–5.2)
BUN: 32 mg/dL — ABNORMAL HIGH (ref 6–23)
Calcium: 9.5 mg/dL (ref 8.4–10.5)
Creatinine, Ser: 1.34 mg/dL — ABNORMAL HIGH (ref 0.50–1.10)
GFR calc Af Amer: 48 mL/min — ABNORMAL LOW (ref >60)
Glucose, Bld: 100 mg/dL — ABNORMAL HIGH (ref 70–99)
Total Protein: 6.8 g/dL (ref 6.0–8.3)

## 2011-05-09 MED ORDER — FUROSEMIDE 20 MG PO TABS: 40.0000 mg | ORAL_TABLET | Freq: Every day | ORAL | Status: DC

## 2011-05-09 NOTE — Discharge Summary (Addendum)
Physician Discharge Summary  Patient ID: [name] [name] MRN: [id number] DOB/AGE: 1946/09/05 65 y.o. Primary Care Physician:KOIRALA,[name], MD Admit date: 05/07/2011 Discharge date: 05/09/2011    Discharge Diagnoses:  1. Acute on chronic systolic congestive heart failure, improved. Ejection fraction 35-40%. 14 pound weight loss. 2. Hypertension. 3. Obstructive sleep apnea. 4. Renal insufficiency, likely related to diuretics.   Current Discharge Medication List    CONTINUE these medications which have CHANGED   Details  furosemide (LASIX) 20 MG tablet Take 2 tablets (40 mg total) by mouth daily. Qty: 30 tablet, Refills: 0      CONTINUE these medications which have NOT CHANGED   Details  allopurinol (ZYLOPRIM) 300 MG tablet Take 300 mg by mouth daily.      aspirin 325 MG tablet Take 325 mg by mouth daily.      COD LIVER OIL PO Take 1 tablet by mouth daily.      colchicine-probenecid 0.5-500 MG per tablet Take 1 tablet by mouth 2 (two) times daily.     Cyanocobalamin (VITAMIN B-12 PO) Take 1 tablet by mouth daily.      HYDROcodone-acetaminophen (LORTAB) 10-500 MG per tablet Take 1 tablet by mouth 3 (three) times daily.      lisinopril (PRINIVIL,ZESTRIL) 10 MG tablet Take 10 mg by mouth daily.      Multiple Vitamin (MULTIVITAMIN) tablet Take 1 tablet by mouth daily.      omeprazole (PRILOSEC) 20 MG capsule Take 20 mg by mouth daily.      potassium chloride SA (K-DUR,KLOR-CON) 20 MEQ tablet Take 20 mEq by mouth 3 (three) times daily.          Discharged Condition: Stable and improved.    Consults: None.  Significant Diagnostic Studies: Dg Chest Portable 1 View  05/07/2011  *RADIOLOGY REPORT*  Clinical Data: Short of breath.  Obesity.  PORTABLE CHEST - 1 VIEW  Comparison: 01/02/2011.  Findings: Left subclavian cardiac pacemaker.  Severe enlargement of the cardiopericardial silhouette. Pericardial effusion is not excluded.  Pulmonary vascular congestion is present with  interstitial pulmonary edema.  Suboptimal visualization of the lung bases secondary to body habitus.  There may be a right pleural effusion.  Trachea midline.  IMPRESSION:  1.  Marked enlargement of the cardiopericardial silhouette likely representing cardiomegaly. 2.  CHF.  Original Report Authenticated By: Dereck Ligas, M.D.   *[facility]* [address], [place] [address] [phone]  -------------------------------------------------------------------- Transthoracic Echocardiography  Patient: [name], [name] MR #: [id number] Study Date: 05/08/2011 Gender: F Age: 14 Height: 373.4cm Weight: 157.9kg BSA: 3.20m2 Pt. Status: Room: AMorichesORDERING CKarlyn AgeeREFERRING CKarlyn AgeePERFORMING LAneta MinsPenn ATTENDING Mcmanus, KHerbie BaltimoreTriadhosp, Ap2 cc:  -------------------------------------------------------------------- Indications: CHF - 428.0.  -------------------------------------------------------------------- History: PMH: Atrial fibrillation. Congestive heart failure. PMH: former smoker, obstructive sleep apnea Risk factors: Hypertension.  -------------------------------------------------------------------- Study Conclusions  - Left ventricle: The cavity size was mildly dilated. There was mild concentric hypertrophy. Systolic function was moderately reduced. The estimated ejection fraction was in the range of 35% to 40%. Severe hypokinesis akinesis of the mid-distal anteroseptal segment and mild apical dyskinesis. - Aortic valve: Mildly calcified annulus. Mildly calcified leaflets. - Mitral valve: Calcified annulus. Mild regurgitation. - Left atrium: The atrium was moderately dilated. - Right atrium: The atrium was mildly dilated. - Pulmonary arteries: PA peak pressure: 358mHg (S).    Lab Results: Results for orders placed during the hospital encounter of 05/07/11 (from the past 48  hour(s))  BASIC METABOLIC PANEL     Status: Abnormal   Collection Time   05/07/11  7:01 PM      Component Value Range Comment   Sodium 136  135 - 145 (mEq/L)    Potassium 4.3  3.5 - 5.1 (mEq/L)    Chloride 100  96 - 112 (mEq/L)    CO2 23  19 - 32 (mEq/L)    Glucose, Bld 116 (*) 70 - 99 (mg/dL)    BUN 25 (*) 6 - 23 (mg/dL)    Creatinine, Ser 1.33 (*) 0.50 - 1.10 (mg/dL)    Calcium 10.0  8.4 - 10.5 (mg/dL)    GFR calc non Af Amer 40 (*) >60 (mL/min)    GFR calc Af Amer 48 (*) >60 (mL/min)   CARDIAC PANEL(CRET KIN+CKTOT+MB+TROPI)     Status: Normal   Collection Time   05/07/11  7:01 PM      Component Value Range Comment   Total CK 37  7 - 177 (U/L)    CK, MB 2.5  0.3 - 4.0 (ng/mL)    Troponin I <0.30  <0.30 (ng/mL)    Relative Index RELATIVE INDEX IS INVALID  0.0 - 2.5    CBC     Status: Abnormal   Collection Time   05/07/11  7:01 PM      Component Value Range Comment   WBC 6.8  4.0 - 10.5 (K/uL)    RBC 3.78 (*) 3.87 - 5.11 (MIL/uL)    Hemoglobin 11.8 (*) 12.0 - 15.0 (g/dL)    HCT 35.8 (*) 36.0 - 46.0 (%)    MCV 94.7  78.0 - 100.0 (fL)    MCH 31.2  26.0 - 34.0 (pg)    MCHC 33.0  30.0 - 36.0 (g/dL)    RDW 15.0  11.5 - 15.5 (%)    Platelets 155  150 - 400 (K/uL)   DIFFERENTIAL     Status: Normal   Collection Time   05/07/11  7:01 PM      Component Value Range Comment   Neutrophils Relative 74  43 - 77 (%)    Neutro Abs 5.0  1.7 - 7.7 (K/uL)    Lymphocytes Relative 18  12 - 46 (%)    Lymphs Abs 1.2  0.7 - 4.0 (K/uL)    Monocytes Relative 7  3 - 12 (%)    Monocytes Absolute 0.5  0.1 - 1.0 (K/uL)    Eosinophils Relative 1  0 - 5 (%)    Eosinophils Absolute 0.0  0.0 - 0.7 (K/uL)    Basophils Relative 0  0 - 1 (%)    Basophils Absolute 0.0  0.0 - 0.1 (K/uL)   TSH     Status: Normal   Collection Time   05/07/11  7:01 PM      Component Value Range Comment   TSH 2.784  0.350 - 4.500 (uIU/mL)   PRO B NATRIURETIC PEPTIDE     Status: Abnormal   Collection Time   05/07/11  7:02 PM        Component Value Range Comment   BNP, POC 5617.0 (*) 0 - 125 (pg/mL)   URINALYSIS, ROUTINE W REFLEX MICROSCOPIC     Status: Abnormal   Collection Time   05/07/11  7:43 PM      Component Value Range Comment   Color, Urine YELLOW  YELLOW     Appearance CLEAR  CLEAR     Specific Gravity, Urine 1.020  1.005 - 1.030  pH 5.5  5.0 - 8.0     Glucose, UA NEGATIVE  NEGATIVE (mg/dL)    Hgb urine dipstick NEGATIVE  NEGATIVE     Bilirubin Urine NEGATIVE  NEGATIVE     Ketones, ur NEGATIVE  NEGATIVE (mg/dL)    Protein, ur NEGATIVE  NEGATIVE (mg/dL)    Urobilinogen, UA 0.2  0.0 - 1.0 (mg/dL)    Nitrite NEGATIVE  NEGATIVE     Leukocytes, UA MODERATE (*) NEGATIVE    URINE MICROSCOPIC-ADD ON     Status: Abnormal   Collection Time   05/07/11  7:43 PM      Component Value Range Comment   Squamous Epithelial / LPF MANY (*) RARE     WBC, UA 21-50  <3 (WBC/hpf)    Bacteria, UA MANY (*) RARE    MRSA PCR SCREENING     Status: Normal   Collection Time   05/08/11  4:36 AM      Component Value Range Comment   MRSA by PCR NEGATIVE  NEGATIVE    MAGNESIUM     Status: Normal   Collection Time   05/08/11  4:41 AM      Component Value Range Comment   Magnesium 2.0  1.5 - 2.5 (mg/dL)   BASIC METABOLIC PANEL     Status: Abnormal   Collection Time   05/08/11  4:41 AM      Component Value Range Comment   Sodium 141  135 - 145 (mEq/L)    Potassium 4.0  3.5 - 5.1 (mEq/L)    Chloride 103  96 - 112 (mEq/L)    CO2 23  19 - 32 (mEq/L)    Glucose, Bld 102 (*) 70 - 99 (mg/dL)    BUN 27 (*) 6 - 23 (mg/dL)    Creatinine, Ser 1.20 (*) 0.50 - 1.10 (mg/dL)    Calcium 9.7  8.4 - 10.5 (mg/dL)    GFR calc non Af Amer 45 (*) >60 (mL/min)    GFR calc Af Amer 54 (*) >60 (mL/min)   CBC     Status: Abnormal   Collection Time   05/09/11  5:51 AM      Component Value Range Comment   WBC 3.8 (*) 4.0 - 10.5 (K/uL)    RBC 3.72 (*) 3.87 - 5.11 (MIL/uL)    Hemoglobin 11.3 (*) 12.0 - 15.0 (g/dL)    HCT 35.2 (*) 36.0 - 46.0  (%)    MCV 94.6  78.0 - 100.0 (fL)    MCH 30.4  26.0 - 34.0 (pg)    MCHC 32.1  30.0 - 36.0 (g/dL)    RDW 15.1  11.5 - 15.5 (%)    Platelets 130 (*) 150 - 400 (K/uL)   COMPREHENSIVE METABOLIC PANEL     Status: Abnormal   Collection Time   05/09/11  5:51 AM      Component Value Range Comment   Sodium 141  135 - 145 (mEq/L)    Potassium 4.9  3.5 - 5.1 (mEq/L) DELTA CHECK NOTED   Chloride 101  96 - 112 (mEq/L)    CO2 27  19 - 32 (mEq/L)    Glucose, Bld 100 (*) 70 - 99 (mg/dL)    BUN 32 (*) 6 - 23 (mg/dL)    Creatinine, Ser 1.34 (*) 0.50 - 1.10 (mg/dL)    Calcium 9.5  8.4 - 10.5 (mg/dL)    Total Protein 6.8  6.0 - 8.3 (g/dL)    Albumin 3.3 (*) 3.5 - 5.2 (  g/dL)    AST 25  0 - 37 (U/L)    ALT 23  0 - 35 (U/L)    Alkaline Phosphatase 83  39 - 117 (U/L)    Total Bilirubin 0.4  0.3 - 1.2 (mg/dL)    GFR calc non Af Amer 40 (*) >60 (mL/min)    GFR calc Af Amer 48 (*) >60 (mL/min)    Recent Results (from the past 240 hour(s))  MRSA PCR SCREENING     Status: Normal   Collection Time   05/08/11  4:36 AM      Component Value Range Status Comment   MRSA by PCR NEGATIVE  NEGATIVE  Final      Hospital Course: This 65 year old lady was admitted with a 3 week history of dyspnea progressively on minimal exertion. Please see initial history and physical examination done by Dr. Karlyn Agee. This lady has systolic and diastolic dysfunction with previously recorded. Ejection fraction of 30-35% in 2009. She also has obstructive sleep apnea and is on BiPAP. She also has a history of atrial fibrillation, status post nodal ablation and pacemaker placement. She has been offered anticoagulation with Coumadin, but has refused and takes aspirin daily. She admitted that she does not take her prescribed Lasix 20 mg daily on a daily basis. She says that she cannot control her urination if she takes Lasix and makes it difficult for her in social situations.  She was admitted and started on intravenous Lasix 40 mg  twice a day. This achieved a good diuresis and she lost significant weight. Her admission weight was 358 pounds and today she weighs 344 pounds. She feels that she is almost back to her baseline in terms of dyspnea. An echocardiogram has been done on this admission and it shows an ejection fraction, which is slightly improved. Her ejection fraction now is 35-40%.  Discharge Exam: Blood pressure 145/87, pulse 95, temperature 97.6 F (36.4 C), temperature source Oral, resp. rate 20, height 5' 7"  (1.702 m), weight 156.128 kg (344 lb 3.2 oz), SpO2 96.00%. Please note the admission weight was 358 pounds. Today she looks systemically well, not dyspneic at rest. Heart sounds are present and normal without murmurs. There is no gallop rhythm. Lung fields are clinically clear without crackles. She is alert and orientated without any focal neurological signs. Her peripheral pitting edema has also decreased. Her Lasix intravenously has been discontinued, and she has been converted to oral Lasix at a higher dose of 40 mg daily. Her renal function was deteriorating slightly with intravenous Lasix, which is to be expected.  Disposition: Home. We will try to arrange home health also. She must followup with her cardiologist, Dr. Lovena Le in approximately one week.  Discharge Orders    Future Orders Please Complete By Expires   Diet - low sodium heart healthy      Increase activity slowly      Discharge instructions      Comments:   Lasix has been increased to 40 mg daily. Patient was instructed to continue taking Colchicine at home      Follow-up Information    Follow up with Cristopher Peru, MD. Make an appointment in 1 week.   Contact information:   [address] [place] [phone]          Signed: Doree Albee 05/09/2011, 8:09 AM

## 2011-05-16 ENCOUNTER — Encounter: Payer: Self-pay | Admitting: Internal Medicine

## 2011-05-16 ENCOUNTER — Ambulatory Visit (INDEPENDENT_AMBULATORY_CARE_PROVIDER_SITE_OTHER): Payer: Medicare Other | Admitting: Internal Medicine

## 2011-05-16 DIAGNOSIS — I509 Heart failure, unspecified: Secondary | ICD-10-CM

## 2011-05-16 DIAGNOSIS — I4891 Unspecified atrial fibrillation: Secondary | ICD-10-CM

## 2011-05-16 DIAGNOSIS — Z950 Presence of cardiac pacemaker: Secondary | ICD-10-CM

## 2011-05-16 LAB — PACEMAKER DEVICE OBSERVATION
BMOD-0002RV: 13
BRDY-0002RV: 90 {beats}/min
BRDY-0004RV: 120 {beats}/min
BRDY-0005RV: 80 {beats}/min
DEVICE MODEL PM: 7171350

## 2011-05-16 NOTE — Patient Instructions (Signed)
Your physician wants you to follow-up in: December 2012.   You will receive a reminder letter in the mail two months in advance. If you don't receive a letter, please call our office to schedule the follow-up appointment.

## 2011-05-16 NOTE — Assessment & Plan Note (Signed)
Her device is working normally. Will recheck in several months.

## 2011-05-16 NOTE — Progress Notes (Signed)
HPI [name] [name] returns today for followup. She is a pleasant morbidly obese woman with chronic atrial fib, chb, s/p PPM. She has developed renal stones, and was recently hospitalized with acute on chronic systolic heart failure requiring ventilatory support. She was diuresed. She has managed to keep her weight down since discharge from the hospital. She has tried to maintain a low sodium diet. Allergies  Allergen Reactions  . Beta Adrenergic Blockers     REACTION: bradycardia  . Ciprofloxacin     REACTION: hives  . Codeine     REACTION: throat swelling  . Latex     REACTION: rash, itching  . Penicillins     REACTION: hives  . Peppermint Oil   . Prednisone     REACTION: swelling, S.O.B.     Current Outpatient Prescriptions  Medication Sig Dispense Refill  . allopurinol (ZYLOPRIM) 300 MG tablet Take 300 mg by mouth daily.        Marland Kitchen aspirin 325 MG tablet Take 325 mg by mouth daily.        . COD LIVER OIL PO Take 1 tablet by mouth daily.        . colchicine 0.6 MG tablet Take 0.6 mg by mouth as needed.        . Cyanocobalamin (VITAMIN B-12 PO) Take 1 tablet by mouth daily.        . furosemide (LASIX) 20 MG tablet Take 2 tablets (40 mg total) by mouth daily.  30 tablet  0  . HYDROcodone-acetaminophen (LORTAB) 10-500 MG per tablet Take 1 tablet by mouth 3 (three) times daily.        Marland Kitchen lisinopril (PRINIVIL,ZESTRIL) 10 MG tablet Take 10 mg by mouth daily.        . Multiple Vitamin (MULTIVITAMIN) tablet Take 1 tablet by mouth daily.        Marland Kitchen omeprazole (PRILOSEC) 20 MG capsule Take 20 mg by mouth daily.        . potassium chloride SA (K-DUR,KLOR-CON) 20 MEQ tablet Take 20 mEq by mouth 3 (three) times daily.           Past Medical History  Diagnosis Date  . SYSTOLIC chf,EF 84-13%   . Atrial fibrillation   . Hypertension   . Bursitis   . GERD (gastroesophageal reflux disease)   . Cancer   . Cataract   . Gallstones   . Kidney stones   . Sleep apnea   . CPAP/BiPAP dependent    at night   . Morbid obesity   . Chronic cellulitis   . Degenerative joint disease   . Gout   . Diverticulitis   . Blood transfusion   . Anemia     ROS:   All systems reviewed and negative except as noted in the HPI.   Past Surgical History  Procedure Date  . Abdominal hysterectomy 1991  . Pacemaker insertion Nov 2000  . Eye surgery   . Percutaneous nephrolithotomy April 2012     No family history on file.   History   Social History  . Marital Status: Widowed    Spouse Name: N/A    Number of Children: N/A  . Years of Education: N/A   Occupational History  . Not on file.   Social History Main Topics  . Smoking status: Former Research scientist (life sciences)  . Smokeless tobacco: Not on file  . Alcohol Use: No  . Drug Use: No  . Sexually Active:    Other Topics Concern  . Not  on file   Social History Narrative  . No narrative on file     BP 157/91  Pulse 98  Ht 5' 7"  (1.702 m)  Wt 329 lb 6.4 oz (149.415 kg)  BMI 51.59 kg/m2  Physical Exam:  Morbidly obese appearing NAD HEENT: Unremarkable Neck:  No JVD, no thyromegally Lymphatics:  No adenopathy Back:  No CVA tenderness Lungs:  Clear. Well healed PPM incision. HEART:  Regular rate rhythm, no murmurs, no rubs, no clicks Abd:  soft, positive bowel sounds, no organomegally, no rebound, no guarding Ext:  2 plus pulses, no edema, no cyanosis, no clubbing Skin:  No rashes no nodules Neuro:  CN II through XII intact, motor grossly intact  DEVICE  Normal device function.  See PaceArt for details.   Assess/Plan:

## 2011-05-16 NOTE — Assessment & Plan Note (Signed)
Her chronic systolic heart failure remains difficult to control. She will not try even a low dose of beta blocker despite my urging. I will hold off on medication uptitration for now. She will try to maintain a low sodium diet.

## 2011-05-16 NOTE — Assessment & Plan Note (Signed)
She will maintain a low sodium diet. She will continue her current meds.

## 2011-05-31 NOTE — Progress Notes (Signed)
Encounter addended by: Doree Albee on: 05/31/2011  3:21 PM<BR>     Documentation filed: Notes Section

## 2011-05-31 NOTE — Progress Notes (Signed)
Encounter addended by: Harriet Pho on: 05/31/2011  7:58 AM<BR>     Documentation filed: Flowsheet VN

## 2011-06-26 LAB — BASIC METABOLIC PANEL
CO2: 25
CO2: 28
Calcium: 9.1
Chloride: 101
Creatinine, Ser: 0.9
Creatinine, Ser: 1.17
GFR calc Af Amer: 57 — ABNORMAL LOW
GFR calc Af Amer: >60
Glucose, Bld: 105 — ABNORMAL HIGH
Potassium: 3.7
Potassium: 5.1
Sodium: 135
Sodium: 140

## 2011-06-26 LAB — CBC
HCT: 36.6
Hemoglobin: 11.9 — ABNORMAL LOW
Hemoglobin: 12.5
Hemoglobin: 12.5
MCHC: 33.9
MCHC: 34
MCHC: 34.1
MCV: 94.8
MCV: 94.9
RBC: 3.53 — ABNORMAL LOW
RBC: 3.71 — ABNORMAL LOW
RBC: 3.9
RDW: 14.4
WBC: 5.3
WBC: 5.9

## 2011-06-26 LAB — HEPARIN LEVEL (UNFRACTIONATED)
Heparin Unfractionated: 0.36
Heparin Unfractionated: 0.44

## 2011-06-26 LAB — POCT I-STAT 3, ART BLOOD GAS (G3+)
Acid-Base Excess: 3 — ABNORMAL HIGH
Bicarbonate: 27.4 — ABNORMAL HIGH
O2 Saturation: 93
pO2, Arterial: 67 — ABNORMAL LOW

## 2011-06-26 LAB — PLATELET COUNT: Platelets: 100 — ABNORMAL LOW

## 2011-06-26 LAB — CARDIAC PANEL(CRET KIN+CKTOT+MB+TROPI)
CK, MB: 1.3
CK, MB: 1.4
Relative Index: INVALID
Relative Index: INVALID
Total CK: 47

## 2011-06-26 LAB — POCT I-STAT 3, VENOUS BLOOD GAS (G3P V)
Acid-Base Excess: 2
O2 Saturation: 65

## 2011-06-27 LAB — CBC
Hemoglobin: 13
MCHC: 34.5
RBC: 4.05
WBC: 6.3

## 2011-06-27 LAB — DIFFERENTIAL
Basophils Absolute: 0
Basophils Relative: 1
Eosinophils Absolute: 0.1
Eosinophils Relative: 2
Lymphocytes Relative: 23
Monocytes Absolute: 0.4

## 2011-06-27 LAB — POCT CARDIAC MARKERS: Troponin i, poc: <0.05

## 2011-06-27 LAB — BASIC METABOLIC PANEL
CO2: 26
Calcium: 9.4
GFR calc Af Amer: >60
GFR calc non Af Amer: 57 — ABNORMAL LOW
Sodium: 139

## 2011-06-27 LAB — B-NATRIURETIC PEPTIDE (CONVERTED LAB): Pro B Natriuretic peptide (BNP): 144 — ABNORMAL HIGH

## 2011-07-14 IMAGING — CR DG CHEST 2V
2 series · 2 of 2 positions shown · non-contrast
Comparison: 01/01/2011 and 12/03/2010.

CLINICAL DATA: Right chest pain.  Right rib fracture.

CHEST - 2 VIEW

[view not recorded (1 of 2)]
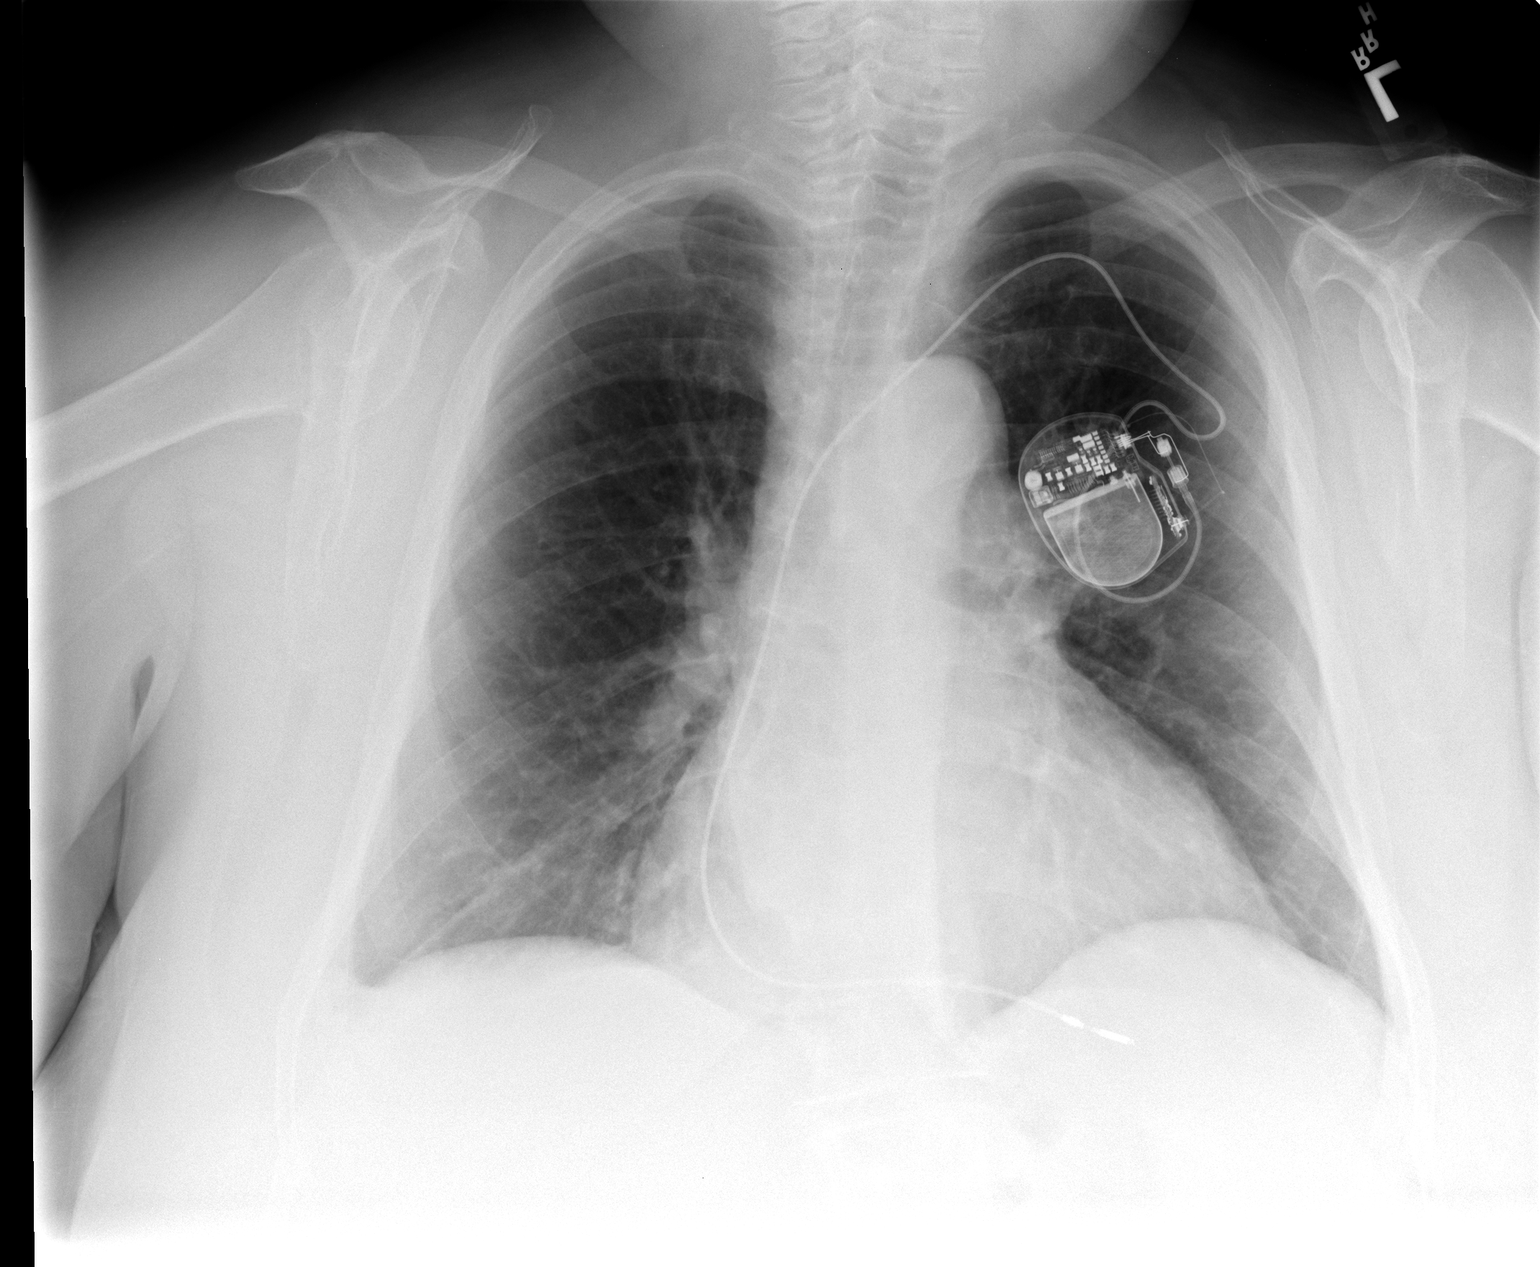

[view not recorded (2 of 2)]
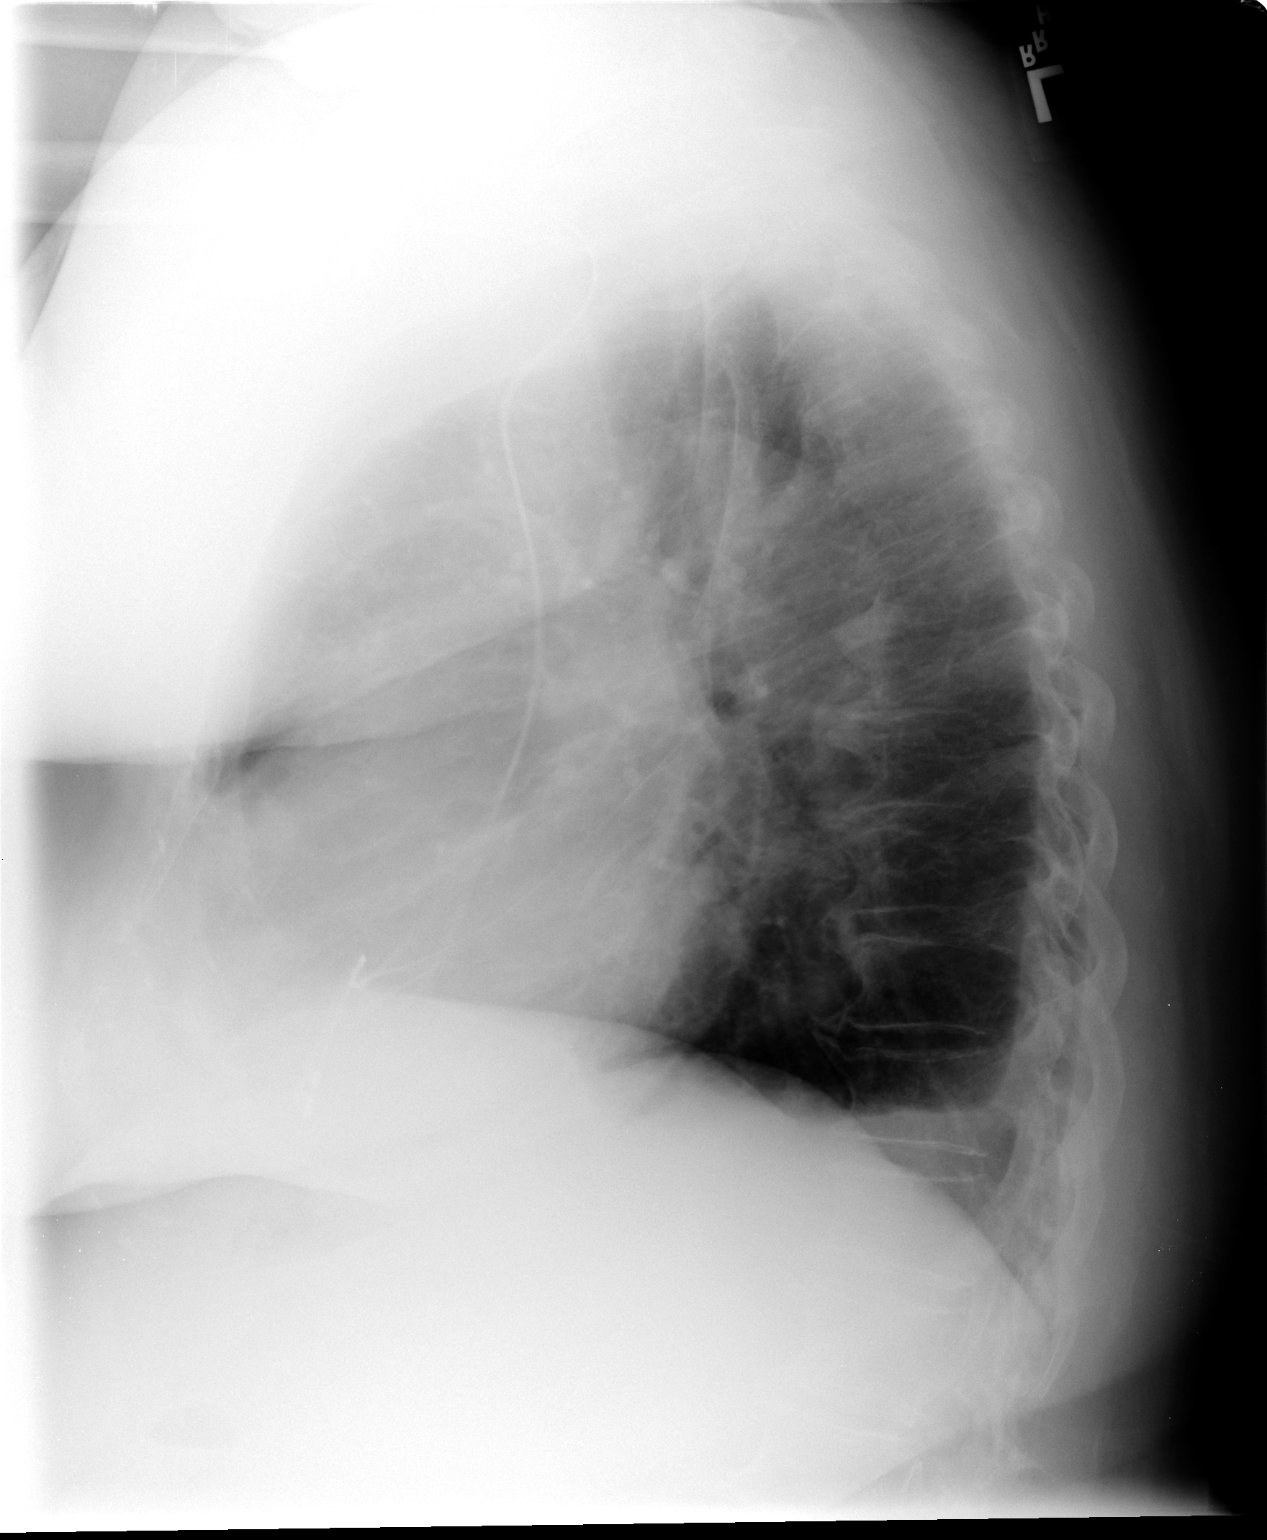

[2 of 2 positions shown; findings below may reference images not displayed]

FINDINGS: Left subclavian pacemaker is unchanged at the right
ventricular apex.  There is stable mild cardiac enlargement.  The
patient has developed a small right pleural effusion.  There is no
pneumothorax. The lungs remain clear.  No displaced fractures are
identified.
IMPRESSION: Developing small right pleural effusion, supporting the presence of
an acute right-sided rib fracture.  No pneumothorax or airspace
disease.

## 2011-07-14 IMAGING — CR DG ABDOMEN 2V
4 series · 4 of 4 positions shown · non-contrast
Comparison: One-view abdomen 12/04/2010.  Abdominal CT 11/26/2010.

CLINICAL DATA: Right side abdominal pain.  Possible rib fracture.

ABDOMEN - 2 VIEW

[view not recorded (1 of 4)]
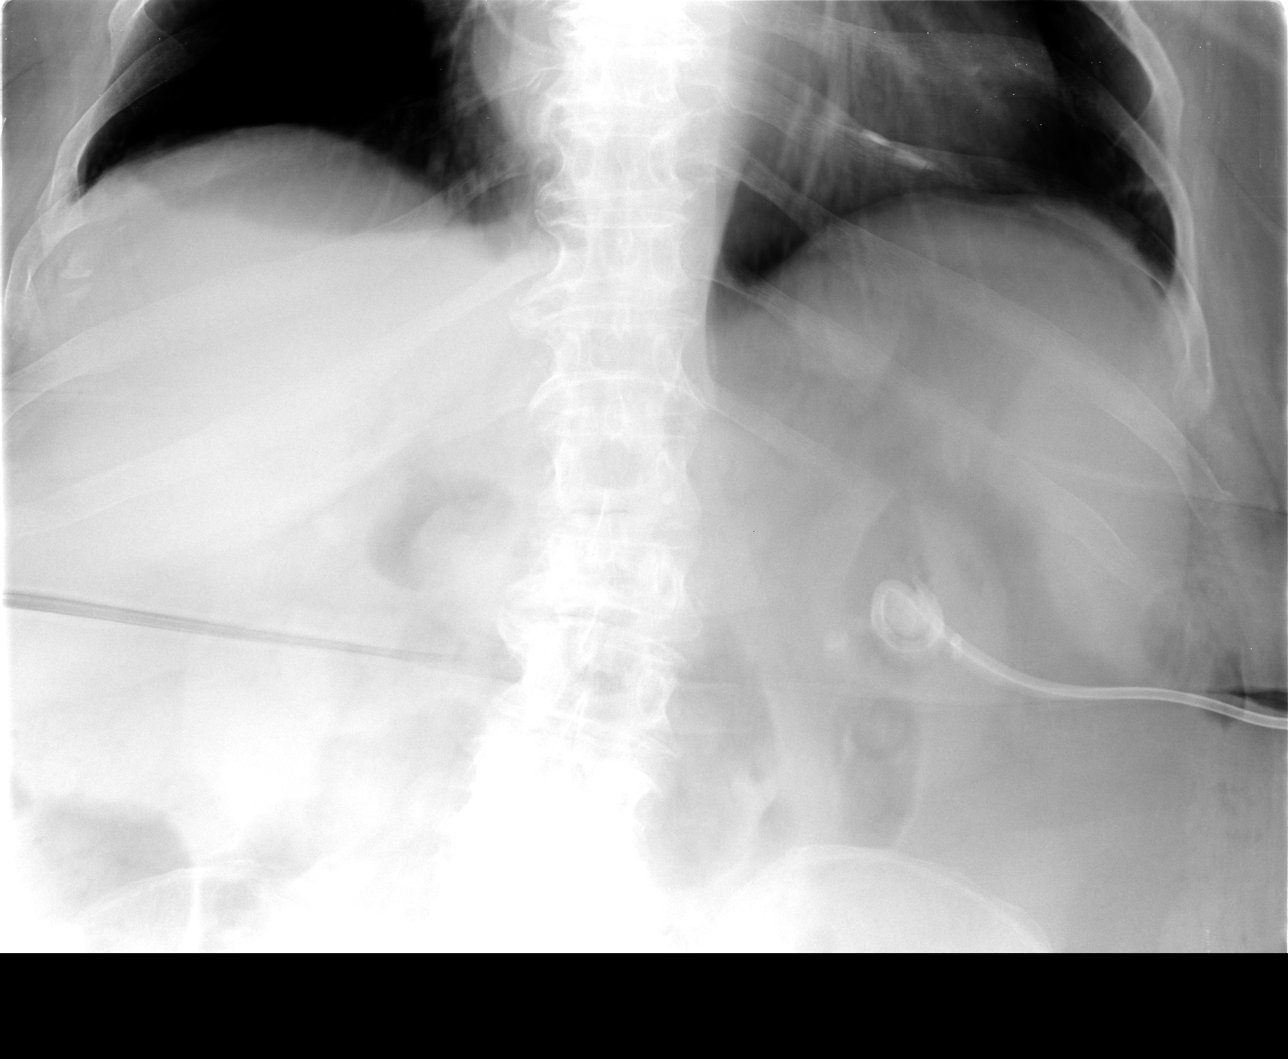

[view not recorded (2 of 4)]
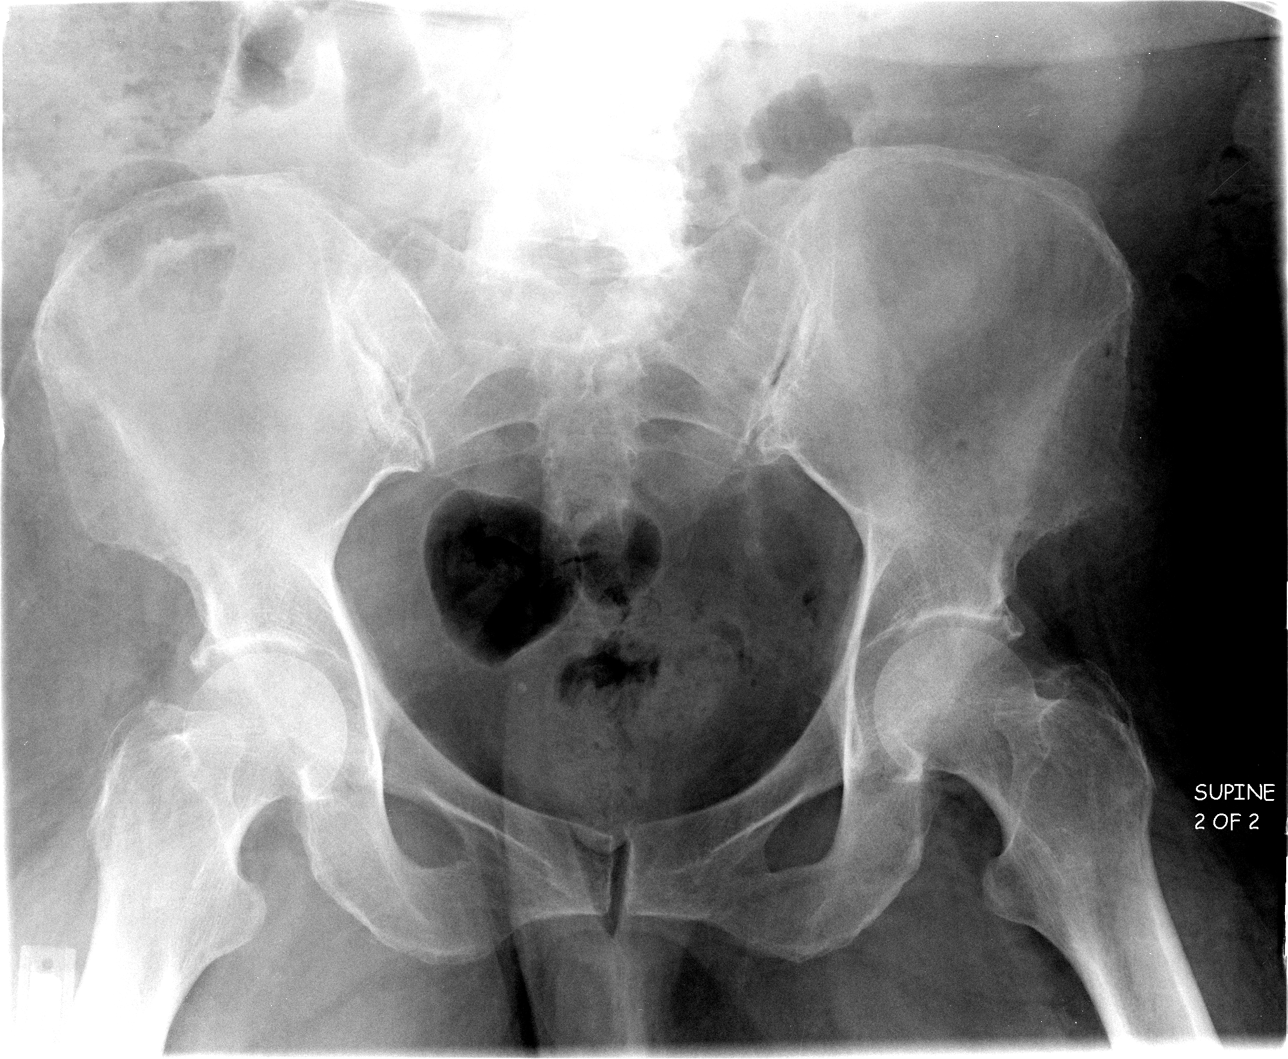

[view not recorded (3 of 4)]
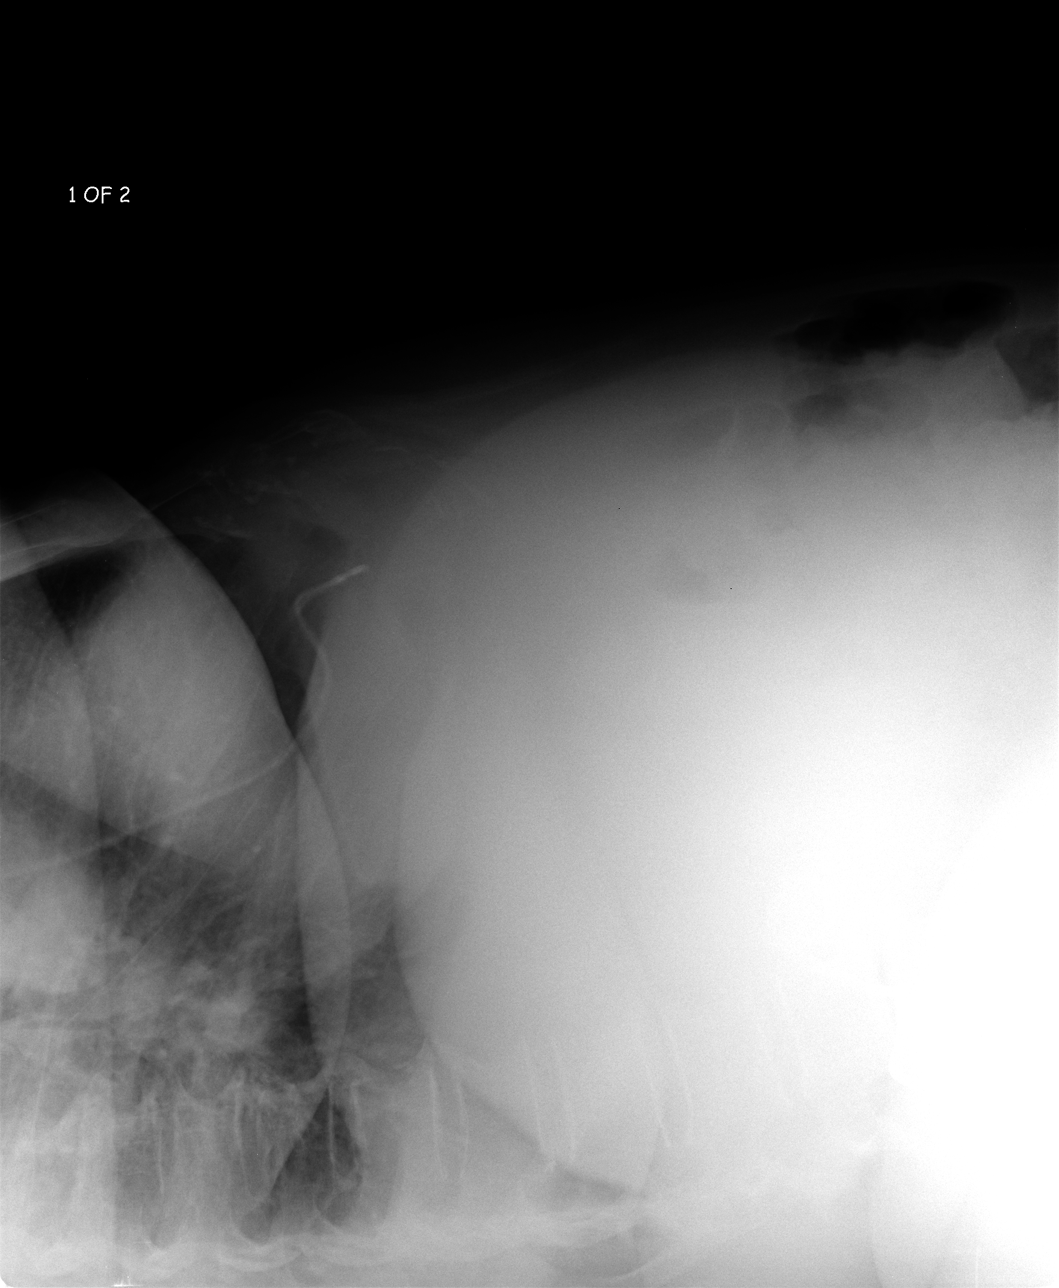

[view not recorded (4 of 4)]
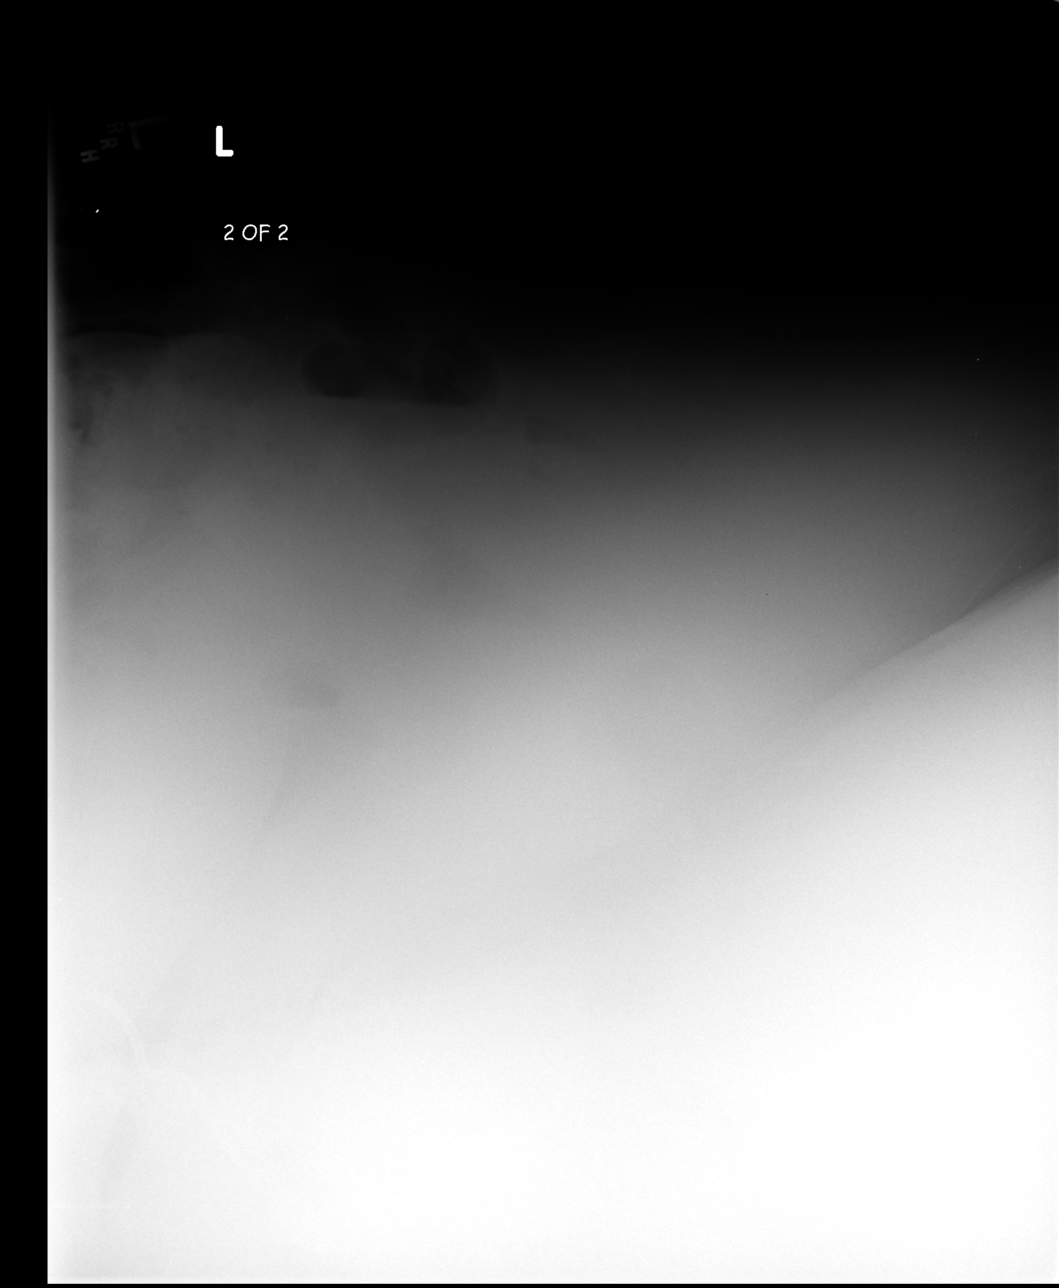

[4 of 4 positions shown; findings below may reference images not displayed]

FINDINGS: Left percutaneous nephrostomy is noted. The lower ribs
appear intact without evidence of fracture.  The visualized bowel
gas pattern is normal.  Lateral decubitus views were attempted and
do not show any evidence of pneumoperitoneum.  The patient has a
pacemaker.  There is lumbar spondylosis and a mild convex left
scoliosis.
IMPRESSION: No acute abdominal findings demonstrated status post recent left
percutaneous nephrostomy placement.  The lower ribs appear intact.

## 2011-08-02 ENCOUNTER — Ambulatory Visit
Admission: RE | Admit: 2011-08-02 | Discharge: 2011-08-02 | Disposition: A | Discharge status: Home / Self Care | Payer: Medicare Other | Source: Ambulatory Visit | Attending: Rheumatology | Admitting: Rheumatology

## 2011-08-02 ENCOUNTER — Other Ambulatory Visit: Payer: Self-pay | Admitting: Rheumatology

## 2011-08-02 DIAGNOSIS — R52 Pain, unspecified: Secondary | ICD-10-CM

## 2011-08-14 ENCOUNTER — Encounter: Payer: Self-pay | Admitting: Pulmonary Disease

## 2011-08-14 ENCOUNTER — Ambulatory Visit (INDEPENDENT_AMBULATORY_CARE_PROVIDER_SITE_OTHER): Payer: Medicare Other | Admitting: Pulmonary Disease

## 2011-08-14 DIAGNOSIS — G4733 Obstructive sleep apnea (adult) (pediatric): Secondary | ICD-10-CM

## 2011-08-14 NOTE — Progress Notes (Signed)
  Subjective:    Patient ID: [name] [name], female    DOB: [birth year], 65 y.o.   MRN: [id number]  HPI Primary Provider: Dr. [name], Collie Siad (ENT)   65/F, morbidly obese ex- smoker for FU of obstructive sleep apnea.  She has been maintained on BiPAP since 1994, initially 15/11.  6/04 >>wt 360 lbs >> BiPAP was re-titarted to 19/12 to abolish snoring although 15/11 was adequate for events.  CPAP was titrated again on 02/17/09 ([facility], Dr Radford Pax) to 13 cm >> she did not tolerate CPAP.  Supplier is [employer],uses small nasal mask with humidity  She has no regular sleep habits, moves around in a motorised scooter.    Baseline PSG 9/11 showed AHI 30/h, RDI 86/h & desaturation to 87%.  Data 8/17-8/30/11 >> autoBIPAP - avg pr 17/15, good compliance, no residuals.  autoBiPAP download 10/25-11/8/11 >> avg pr 18/14, good compliance, AHI 3/h, no leak  08/14/2011 Hospitalised 3/12  Urosepsis with calculi, required PCN, required mech vent x 6ds,  Took dT shot,  mask ok, pressure ok, denies excessive daytime somnolence , no choking episodes, no bed partner hisotry available for objective data    Review of Systems  Patient denies significant dyspnea,cough, hemoptysis,  chest pain, palpitations, pedal edema, orthopnea, paroxysmal nocturnal dyspnea, lightheadedness, nausea, vomiting, abdominal or  leg pains      Objective:   Physical Exam  Gen. Pleasant, obese, in no distress ENT - no lesions, no post nasal drip Neck: No JVD, no thyromegaly, no carotid bruits Lungs: no use of accessory muscles, no dullness to percussion, decreased without rales or rhonchi  Cardiovascular: Rhythm regular, heart sounds  normal, no murmurs or gallops, no peripheral edema Musculoskeletal: No deformities, no cyanosis or clubbing , no tremors       Assessment & Plan:

## 2011-08-14 NOTE — Patient Instructions (Signed)
Congratulations on losing weight !

## 2011-08-16 ENCOUNTER — Encounter: Payer: Self-pay | Admitting: Pulmonary Disease

## 2011-08-16 NOTE — Assessment & Plan Note (Signed)
Ct autoBiPAP , current settings. Seems to have good compliance by hisotry & previous download Weight loss encouraged, compliance with goal of at least 6 hrs every night is the expectation. Advised against medications with sedative side effects Cautioned against driving when sleepy - understanding that sleepiness will vary on a day to day basis  Discussed care of machine, supplies etc.

## 2011-08-31 ENCOUNTER — Encounter: Payer: Self-pay | Admitting: Internal Medicine

## 2011-08-31 ENCOUNTER — Ambulatory Visit (INDEPENDENT_AMBULATORY_CARE_PROVIDER_SITE_OTHER): Payer: Medicare Other | Admitting: Internal Medicine

## 2011-08-31 VITALS — BP 137/79 | HR 91 | Ht 64.0 in | Wt 325.0 lb

## 2011-08-31 DIAGNOSIS — I4891 Unspecified atrial fibrillation: Secondary | ICD-10-CM

## 2011-08-31 DIAGNOSIS — I495 Sick sinus syndrome: Secondary | ICD-10-CM

## 2011-08-31 DIAGNOSIS — Z950 Presence of cardiac pacemaker: Secondary | ICD-10-CM

## 2011-08-31 LAB — PACEMAKER DEVICE OBSERVATION
BATTERY VOLTAGE: 2.9478 V
BRDY-0002RV: 90 {beats}/min
RV LEAD AMPLITUDE: 12 mv
RV LEAD IMPEDENCE PM: 650 Ohm

## 2011-08-31 MED ORDER — FUROSEMIDE 20 MG PO TABS: ORAL_TABLET | ORAL | Status: DC

## 2011-08-31 NOTE — Patient Instructions (Signed)
**Note De-identified [name] [name] Obfuscation** Your physician recommends that you schedule a follow-up appointment in: 1 year  

## 2011-08-31 NOTE — Assessment & Plan Note (Signed)
Her weight remains stable. I have encouraged her to reduce her po intake.

## 2011-08-31 NOTE — Progress Notes (Signed)
HPI [name] [name] returns today for followup. She is a pleasant 65 yo morbidly obese woman with a h/o chronic atrial fibrillation, HTN, systolic and diastolic heart failure, and s/p PPM/AV node ablation. She remains stable. She denies c/p. She has class 2 dyspnea. Allergies  Allergen Reactions  . Beta Adrenergic Blockers     REACTION: bradycardia  . Ciprofloxacin     REACTION: hives  . Codeine     REACTION: throat swelling  . Latex     REACTION: rash, itching  . Penicillins     REACTION: hives  . Peppermint Oil   . Prednisone     REACTION: swelling, S.O.B.     Current Outpatient Prescriptions  Medication Sig Dispense Refill  . allopurinol (ZYLOPRIM) 300 MG tablet Take 300 mg by mouth daily.        Marland Kitchen aspirin 325 MG tablet Take 325 mg by mouth daily.        . COD LIVER OIL PO Take 1 tablet by mouth daily.        . colchicine 0.6 MG tablet Take 0.6 mg by mouth as needed.        . Cyanocobalamin (VITAMIN B-12 PO) Take 1 tablet by mouth daily.        . furosemide (LASIX) 20 MG tablet Take as directed by MD  45 tablet  11  . HYDROcodone-acetaminophen (NORCO) 10-325 MG per tablet Take 1 tablet by mouth 3 (three) times daily.        Marland Kitchen lisinopril (PRINIVIL,ZESTRIL) 10 MG tablet Take 10 mg by mouth daily.        . Multiple Vitamins-Iron (MULTIVITAMIN/IRON PO) Take 1 tablet by mouth daily.        . NON FORMULARY CPAP As directed       . omeprazole (PRILOSEC) 20 MG capsule Take 20 mg by mouth daily.        . potassium chloride SA (K-DUR,KLOR-CON) 20 MEQ tablet Take 20 mEq by mouth 3 (three) times daily.        . vitamin C (ASCORBIC ACID) 500 MG tablet Take 500 mg by mouth daily.           Past Medical History  Diagnosis Date  . SYSTOLIC chf,EF 43-15%   . Atrial fibrillation   . Hypertension   . Bursitis   . GERD (gastroesophageal reflux disease)   . Cancer   . Cataract   . Gallstones   . Kidney stones   . Sleep apnea   . CPAP/BiPAP dependent     at night   . Morbid obesity     . Chronic cellulitis   . Degenerative joint disease   . Gout   . Diverticulitis   . Blood transfusion   . Anemia   . Sinoatrial node dysfunction     ROS:   All systems reviewed and negative except as noted in the HPI.   Past Surgical History  Procedure Date  . Abdominal hysterectomy 1991  . Pacemaker insertion Nov 2000  . Eye surgery   . Percutaneous nephrolithotomy April 2012     No family history on file.   History   Social History  . Marital Status: Widowed    Spouse Name: N/A    Number of Children: N/A  . Years of Education: N/A   Occupational History  . Not on file.   Social History Main Topics  . Smoking status: Former Smoker -- 1.0 packs/day    Types: Cigarettes    Quit date:  09/25/1974  . Smokeless tobacco: Never Used  . Alcohol Use: No  . Drug Use: No  . Sexually Active: Not on file   Other Topics Concern  . Not on file   Social History Narrative  . No narrative on file     BP 137/79  Pulse 91  Ht 5' 4"  (1.626 m)  Wt 147.419 kg (325 lb)  BMI 55.79 kg/m2  Physical Exam: Morbidly obese appearing woman, NAD HEENT: Unremarkable Neck:  No JVD, no thyromegally Lymphatics:  No adenopathy Back:  No CVA tenderness Lungs:  Clear with no wheezes. HEART:  Regular rate rhythm, no murmurs, no rubs, no clicks Abd:  soft, positive bowel sounds, no organomegally, no rebound, no guarding Ext:  2 plus pulses, 3+ edema, no cyanosis, no clubbing Skin:  No rashes no nodules Neuro:  CN II through XII intact, motor grossly intact  DEVICE  Normal device function.  See PaceArt for details.   Assess/Plan:

## 2011-08-31 NOTE — Assessment & Plan Note (Signed)
Her ventricular rate is well controlled.  She has refused coumadin for over 10 yrs.

## 2011-08-31 NOTE — Assessment & Plan Note (Signed)
Her device is working normally. Will recheck in several months.

## 2011-10-16 DIAGNOSIS — H26499 Other secondary cataract, unspecified eye: Secondary | ICD-10-CM | POA: Diagnosis not present

## 2011-10-19 DIAGNOSIS — M25519 Pain in unspecified shoulder: Secondary | ICD-10-CM | POA: Diagnosis not present

## 2011-10-19 DIAGNOSIS — M25569 Pain in unspecified knee: Secondary | ICD-10-CM | POA: Diagnosis not present

## 2011-10-19 DIAGNOSIS — M109 Gout, unspecified: Secondary | ICD-10-CM | POA: Diagnosis not present

## 2011-10-19 DIAGNOSIS — Z79899 Other long term (current) drug therapy: Secondary | ICD-10-CM | POA: Diagnosis not present

## 2011-11-13 DIAGNOSIS — I10 Essential (primary) hypertension: Secondary | ICD-10-CM | POA: Diagnosis not present

## 2011-11-13 DIAGNOSIS — E538 Deficiency of other specified B group vitamins: Secondary | ICD-10-CM | POA: Diagnosis not present

## 2011-11-13 DIAGNOSIS — D649 Anemia, unspecified: Secondary | ICD-10-CM | POA: Diagnosis not present

## 2011-11-13 DIAGNOSIS — E876 Hypokalemia: Secondary | ICD-10-CM | POA: Diagnosis not present

## 2011-11-30 ENCOUNTER — Encounter: Payer: Self-pay | Admitting: Internal Medicine

## 2011-11-30 ENCOUNTER — Ambulatory Visit (INDEPENDENT_AMBULATORY_CARE_PROVIDER_SITE_OTHER): Payer: Medicare Other | Admitting: *Deleted

## 2011-11-30 DIAGNOSIS — I495 Sick sinus syndrome: Secondary | ICD-10-CM | POA: Diagnosis not present

## 2011-11-30 DIAGNOSIS — Z950 Presence of cardiac pacemaker: Secondary | ICD-10-CM | POA: Diagnosis not present

## 2011-12-01 LAB — REMOTE PACEMAKER DEVICE
BRDY-0002RV: 90 {beats}/min
BRDY-0004RV: 120 {beats}/min
BRDY-0005RV: 80 {beats}/min

## 2011-12-04 ENCOUNTER — Encounter: Payer: Self-pay | Admitting: Internal Medicine

## 2011-12-06 NOTE — Progress Notes (Signed)
Remote pacer check

## 2011-12-25 ENCOUNTER — Encounter: Payer: Self-pay | Admitting: *Deleted

## 2012-01-15 DIAGNOSIS — Z79899 Other long term (current) drug therapy: Secondary | ICD-10-CM | POA: Diagnosis not present

## 2012-01-15 DIAGNOSIS — M109 Gout, unspecified: Secondary | ICD-10-CM | POA: Diagnosis not present

## 2012-01-15 DIAGNOSIS — M25569 Pain in unspecified knee: Secondary | ICD-10-CM | POA: Diagnosis not present

## 2012-02-08 DIAGNOSIS — K8020 Calculus of gallbladder without cholecystitis without obstruction: Secondary | ICD-10-CM | POA: Diagnosis not present

## 2012-02-08 DIAGNOSIS — K439 Ventral hernia without obstruction or gangrene: Secondary | ICD-10-CM | POA: Diagnosis not present

## 2012-02-08 DIAGNOSIS — N1330 Unspecified hydronephrosis: Secondary | ICD-10-CM | POA: Diagnosis not present

## 2012-02-08 DIAGNOSIS — N200 Calculus of kidney: Secondary | ICD-10-CM | POA: Diagnosis not present

## 2012-02-08 DIAGNOSIS — N281 Cyst of kidney, acquired: Secondary | ICD-10-CM | POA: Diagnosis not present

## 2012-02-29 ENCOUNTER — Encounter: Payer: Self-pay | Admitting: Internal Medicine

## 2012-02-29 ENCOUNTER — Ambulatory Visit (INDEPENDENT_AMBULATORY_CARE_PROVIDER_SITE_OTHER): Payer: Medicare Other | Admitting: *Deleted

## 2012-02-29 DIAGNOSIS — Z950 Presence of cardiac pacemaker: Secondary | ICD-10-CM

## 2012-02-29 DIAGNOSIS — I495 Sick sinus syndrome: Secondary | ICD-10-CM | POA: Diagnosis not present

## 2012-03-01 LAB — REMOTE PACEMAKER DEVICE
BRDY-0002RV: 90 {beats}/min
DEVICE MODEL PM: 7171350
RV LEAD THRESHOLD: 0.75 V

## 2012-03-18 ENCOUNTER — Encounter: Payer: Self-pay | Admitting: *Deleted

## 2012-04-11 DIAGNOSIS — M25539 Pain in unspecified wrist: Secondary | ICD-10-CM | POA: Diagnosis not present

## 2012-04-11 DIAGNOSIS — M130 Polyarthritis, unspecified: Secondary | ICD-10-CM | POA: Diagnosis not present

## 2012-04-11 DIAGNOSIS — M109 Gout, unspecified: Secondary | ICD-10-CM | POA: Diagnosis not present

## 2012-04-11 DIAGNOSIS — M25569 Pain in unspecified knee: Secondary | ICD-10-CM | POA: Diagnosis not present

## 2012-04-12 DIAGNOSIS — Z79899 Other long term (current) drug therapy: Secondary | ICD-10-CM | POA: Diagnosis not present

## 2012-04-12 DIAGNOSIS — M130 Polyarthritis, unspecified: Secondary | ICD-10-CM | POA: Diagnosis not present

## 2012-05-13 DIAGNOSIS — R7309 Other abnormal glucose: Secondary | ICD-10-CM | POA: Diagnosis not present

## 2012-05-13 DIAGNOSIS — D649 Anemia, unspecified: Secondary | ICD-10-CM | POA: Diagnosis not present

## 2012-05-13 DIAGNOSIS — E876 Hypokalemia: Secondary | ICD-10-CM | POA: Diagnosis not present

## 2012-05-13 DIAGNOSIS — N951 Menopausal and female climacteric states: Secondary | ICD-10-CM | POA: Diagnosis not present

## 2012-05-13 DIAGNOSIS — I10 Essential (primary) hypertension: Secondary | ICD-10-CM | POA: Diagnosis not present

## 2012-06-06 ENCOUNTER — Ambulatory Visit (INDEPENDENT_AMBULATORY_CARE_PROVIDER_SITE_OTHER): Payer: Medicare Other | Admitting: *Deleted

## 2012-06-06 ENCOUNTER — Encounter: Payer: Self-pay | Admitting: Internal Medicine

## 2012-06-06 ENCOUNTER — Encounter: Payer: Self-pay | Admitting: *Deleted

## 2012-06-06 DIAGNOSIS — I495 Sick sinus syndrome: Secondary | ICD-10-CM

## 2012-06-06 LAB — REMOTE PACEMAKER DEVICE
BMOD-0002RV: 13
BRDY-0002RV: 90 {beats}/min
BRDY-0004RV: 120 {beats}/min
BRDY-0005RV: 80 {beats}/min
DEVICE MODEL PM: 7171350

## 2012-06-17 DIAGNOSIS — N951 Menopausal and female climacteric states: Secondary | ICD-10-CM | POA: Diagnosis not present

## 2012-06-19 ENCOUNTER — Encounter: Payer: Self-pay | Admitting: *Deleted

## 2012-06-27 DIAGNOSIS — M25539 Pain in unspecified wrist: Secondary | ICD-10-CM | POA: Diagnosis not present

## 2012-06-27 DIAGNOSIS — M25569 Pain in unspecified knee: Secondary | ICD-10-CM | POA: Diagnosis not present

## 2012-06-27 DIAGNOSIS — M545 Low back pain: Secondary | ICD-10-CM | POA: Diagnosis not present

## 2012-06-27 DIAGNOSIS — M109 Gout, unspecified: Secondary | ICD-10-CM | POA: Diagnosis not present

## 2012-09-02 ENCOUNTER — Encounter: Payer: Self-pay | Admitting: Internal Medicine

## 2012-09-02 ENCOUNTER — Ambulatory Visit (INDEPENDENT_AMBULATORY_CARE_PROVIDER_SITE_OTHER): Payer: Medicare Other | Admitting: Internal Medicine

## 2012-09-02 VITALS — BP 120/74 | HR 91 | Ht 66.0 in | Wt 343.0 lb

## 2012-09-02 DIAGNOSIS — I10 Essential (primary) hypertension: Secondary | ICD-10-CM

## 2012-09-02 DIAGNOSIS — I495 Sick sinus syndrome: Secondary | ICD-10-CM | POA: Diagnosis not present

## 2012-09-02 DIAGNOSIS — I4891 Unspecified atrial fibrillation: Secondary | ICD-10-CM

## 2012-09-02 DIAGNOSIS — Z950 Presence of cardiac pacemaker: Secondary | ICD-10-CM

## 2012-09-02 LAB — PACEMAKER DEVICE OBSERVATION
BATTERY VOLTAGE: 2.9629 V
BMOD-0002RV: 13
BRDY-0005RV: 80 {beats}/min
DEVICE MODEL PM: 7171350
VENTRICULAR PACING PM: 99

## 2012-09-02 NOTE — Patient Instructions (Signed)
Your physician recommends that you schedule a follow-up appointment in: 1 year with Dr Lovena Le 6 months with Nevin Bloodgood

## 2012-09-02 NOTE — Assessment & Plan Note (Signed)
Her ventricular rate is well controlled. No change in medical therapy. The patient continues to refuse systemic anticoagulation.

## 2012-09-02 NOTE — Assessment & Plan Note (Signed)
Her [employer]. Jude single chamber pacemaker is working normally. We'll plan to recheck in several months. Estimated battery longevity, 9 years.

## 2012-09-02 NOTE — Assessment & Plan Note (Signed)
She continues to gain weight. She is not interested in any surgical option.

## 2012-09-02 NOTE — Progress Notes (Signed)
HPI [name] [name] returns today for followup. 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 She has hypertension, and has refused to take any anticoagulation other than aspirin for the last 10 years. She denies chest pain, syncope, but has gained approximately 15 pounds in the interim. She has class II systolic/diastolic heart failure symptoms. Allergies  Allergen Reactions  . Beta Adrenergic Blockers     REACTION: bradycardia  . Ciprofloxacin     REACTION: hives  . Codeine     REACTION: throat swelling  . Latex     REACTION: rash, itching  . Penicillins     REACTION: hives  . Peppermint Oil   . Prednisone     REACTION: swelling, S.O.B.     Current Outpatient Prescriptions  Medication Sig Dispense Refill  . allopurinol (ZYLOPRIM) 300 MG tablet Take 300 mg by mouth daily.        Marland Kitchen aspirin 325 MG tablet Take 325 mg by mouth daily.        . calcium carbonate (TUMS - DOSED IN MG ELEMENTAL CALCIUM) 500 MG chewable tablet Chew 1 tablet by mouth daily.      . COD LIVER OIL PO Take 1 tablet by mouth daily.        . colchicine 0.6 MG tablet Take 0.6 mg by mouth as needed.        . furosemide (LASIX) 20 MG tablet Take as directed by MD  45 tablet  11  . HYDROcodone-acetaminophen (NORCO) 10-325 MG per tablet Take 1 tablet by mouth 3 (three) times daily.        Marland Kitchen lisinopril (PRINIVIL,ZESTRIL) 10 MG tablet Take 10 mg by mouth daily.        . Multiple Vitamins-Iron (MULTIVITAMIN/IRON PO) Take 1 tablet by mouth daily.        . NON FORMULARY CPAP As directed       . omeprazole (PRILOSEC) 20 MG capsule Take 20 mg by mouth daily.        . potassium chloride SA (K-DUR,KLOR-CON) 20 MEQ tablet Take 20 mEq by mouth 3 (three) times daily.        . vitamin C (ASCORBIC ACID) 500 MG tablet Take 500 mg by mouth daily.           Past Medical History  Diagnosis Date  .  SYSTOLIC chf,EF 62-70%   . Atrial fibrillation   . Hypertension   . Bursitis   . GERD (gastroesophageal reflux disease)   . Cancer   . Cataract   . Gallstones   . Kidney stones   . Sleep apnea   . CPAP/BiPAP dependent     at night   . Morbid obesity   . Chronic cellulitis   . Degenerative joint disease   . Gout   . Diverticulitis   . Blood transfusion   . Anemia   . Sinoatrial node dysfunction     ROS:   All systems reviewed and negative except as noted in the HPI.   Past Surgical History  Procedure Date  . Abdominal hysterectomy 1991  . Pacemaker insertion Nov 2000  . Eye surgery   . Percutaneous nephrolithotomy April 2012     No family history on file.   History   Social History  . Marital Status: Widowed    Spouse Name: N/A    Number of Children: N/A  . Years of Education: N/A  Occupational History  . Not on file.   Social History Main Topics  . Smoking status: Former Smoker -- 1.0 packs/day    Types: Cigarettes    Quit date: 09/25/1974  . Smokeless tobacco: Never Used  . Alcohol Use: No  . Drug Use: No  . Sexually Active: Not on file   Other Topics Concern  . Not on file   Social History Narrative  . No narrative on file     BP 120/74  Pulse 91  Ht 5' 6"  (1.676 m)  Wt 343 lb (155.584 kg)  BMI 55.36 kg/m2  Physical Exam:  Morbidly obese appearing middle-aged woman, NAD HEENT: Unremarkable Neck:  7 cm JVD, no thyromegally Lungs:  Clear with no wheezes, rales, or rhonchi. HEART:  Regular rate rhythm, no murmurs, no rubs, no clicks Abd:  soft, positive bowel sounds, no organomegally, no rebound, no guarding Ext:  2 plus pulses, no edema, no cyanosis, no clubbing Skin:  No rashes no nodules Neuro:  CN II through XII intact, motor grossly intact  DEVICE  Normal device function.  See PaceArt for details.   Assess/Plan:

## 2012-09-02 NOTE — Assessment & Plan Note (Signed)
Her blood pressure is well controlled today. She is encouraged to maintain a low-sodium diet. She will continue her current medical therapy.

## 2012-09-03 ENCOUNTER — Encounter: Payer: Self-pay | Admitting: Internal Medicine

## 2012-09-10 DIAGNOSIS — M109 Gout, unspecified: Secondary | ICD-10-CM | POA: Diagnosis not present

## 2012-09-10 DIAGNOSIS — L408 Other psoriasis: Secondary | ICD-10-CM | POA: Diagnosis not present

## 2012-09-10 DIAGNOSIS — M25569 Pain in unspecified knee: Secondary | ICD-10-CM | POA: Diagnosis not present

## 2012-09-10 DIAGNOSIS — M25529 Pain in unspecified elbow: Secondary | ICD-10-CM | POA: Diagnosis not present

## 2012-10-28 ENCOUNTER — Other Ambulatory Visit: Payer: Self-pay | Admitting: Internal Medicine

## 2012-11-05 ENCOUNTER — Telehealth: Payer: Self-pay | Admitting: Pulmonary Disease

## 2012-11-05 NOTE — Telephone Encounter (Signed)
Fax has been received. RA is out of the office until 11/18/12. Will place in his look at folder for when he returns. LMOMTCB x1 FOR LYNN to make her aware of this

## 2012-11-11 NOTE — Telephone Encounter (Signed)
Spoke with [employer].  Advised form has been received but RA is out of office until Feb 24.  She verbalized understanding and would like a call once this has been taken care of.  Will route to Mindy to f/u on.

## 2012-11-11 NOTE — Telephone Encounter (Signed)
LMTCB x 1 for [name] [name] at [place].

## 2012-11-13 NOTE — Telephone Encounter (Signed)
Form is in Dr. Bari Mantis Look at. Will forward to him so he is aware

## 2012-11-13 NOTE — Telephone Encounter (Signed)
Ok to [employer] fill out form soon

## 2012-11-18 ENCOUNTER — Telehealth: Payer: Self-pay | Admitting: Pulmonary Disease

## 2012-11-18 NOTE — Telephone Encounter (Signed)
This form was signed and faxed back to [place] today .[name] [name]

## 2012-11-18 NOTE — Telephone Encounter (Signed)
Form was giving to [employer]

## 2012-11-18 NOTE — Telephone Encounter (Signed)
Per last phone note Dr. Elsworth Soho states he has the form and will complete it soon, today was his first day back in office. I will forward message to Mindy to see if he gave her the completed form today. Please advise. [place] Bing, CMA

## 2012-11-19 NOTE — Telephone Encounter (Signed)
Jeani Hawking at [place] notified that form was faxed back on 11-18-12.

## 2012-11-21 ENCOUNTER — Telehealth: Payer: Self-pay | Admitting: Pulmonary Disease

## 2012-11-21 NOTE — Telephone Encounter (Signed)
LMTCB x 1 for [place]. Was this a CMN?

## 2012-11-22 NOTE — Telephone Encounter (Signed)
I called and spoke with [name] [name] and asked that she refax the order needed. I do not see the order scanned in at this time. She is re faxing to triage fax. [place] Bing, CMA

## 2012-11-25 NOTE — Telephone Encounter (Signed)
I called [employer] at # given -- line busy x 3.  Will call back.  I do not see fax in triage.

## 2012-11-26 NOTE — Telephone Encounter (Signed)
Form was in Epic under media so I printed and refaxed, cori is aware. [place] Bing, CMA

## 2012-11-28 DIAGNOSIS — H26499 Other secondary cataract, unspecified eye: Secondary | ICD-10-CM | POA: Diagnosis not present

## 2012-12-04 DIAGNOSIS — M25539 Pain in unspecified wrist: Secondary | ICD-10-CM | POA: Diagnosis not present

## 2012-12-04 DIAGNOSIS — M25569 Pain in unspecified knee: Secondary | ICD-10-CM | POA: Diagnosis not present

## 2012-12-04 DIAGNOSIS — M109 Gout, unspecified: Secondary | ICD-10-CM | POA: Diagnosis not present

## 2012-12-04 DIAGNOSIS — M2550 Pain in unspecified joint: Secondary | ICD-10-CM | POA: Diagnosis not present

## 2012-12-09 ENCOUNTER — Ambulatory Visit (INDEPENDENT_AMBULATORY_CARE_PROVIDER_SITE_OTHER): Payer: Medicare Other | Admitting: *Deleted

## 2012-12-09 ENCOUNTER — Other Ambulatory Visit: Payer: Self-pay | Admitting: Internal Medicine

## 2012-12-09 DIAGNOSIS — Z950 Presence of cardiac pacemaker: Secondary | ICD-10-CM | POA: Diagnosis not present

## 2012-12-09 DIAGNOSIS — I495 Sick sinus syndrome: Secondary | ICD-10-CM

## 2012-12-11 LAB — REMOTE PACEMAKER DEVICE
BATTERY VOLTAGE: 2.95 V
BMOD-0002RV: 13
BRDY-0002RV: 90 {beats}/min
RV LEAD IMPEDENCE PM: 610 Ohm

## 2012-12-20 ENCOUNTER — Encounter: Payer: Self-pay | Admitting: *Deleted

## 2013-01-11 ENCOUNTER — Encounter: Payer: Self-pay | Admitting: Internal Medicine

## 2013-01-13 ENCOUNTER — Encounter: Payer: Self-pay | Admitting: Internal Medicine

## 2013-02-24 DIAGNOSIS — M109 Gout, unspecified: Secondary | ICD-10-CM | POA: Diagnosis not present

## 2013-02-24 DIAGNOSIS — Z79899 Other long term (current) drug therapy: Secondary | ICD-10-CM | POA: Diagnosis not present

## 2013-02-24 DIAGNOSIS — M25569 Pain in unspecified knee: Secondary | ICD-10-CM | POA: Diagnosis not present

## 2013-02-24 DIAGNOSIS — L408 Other psoriasis: Secondary | ICD-10-CM | POA: Diagnosis not present

## 2013-03-03 ENCOUNTER — Encounter: Payer: Self-pay | Admitting: Pulmonary Disease

## 2013-03-03 ENCOUNTER — Ambulatory Visit (INDEPENDENT_AMBULATORY_CARE_PROVIDER_SITE_OTHER): Payer: Medicare Other | Admitting: Pulmonary Disease

## 2013-03-03 VITALS — BP 130/90 | HR 100 | Temp 97.7°F | Ht 66.0 in | Wt 325.0 lb

## 2013-03-03 DIAGNOSIS — G4733 Obstructive sleep apnea (adult) (pediatric): Secondary | ICD-10-CM

## 2013-03-03 NOTE — Assessment & Plan Note (Signed)
Ct autobipap We will check a download & the pressure on your machine  Weight loss encouraged, compliance with goal of at least 4-6 hrs every night is the expectation. Advised against medications with sedative side effects Cautioned against driving when sleepy - understanding that sleepiness will vary on a day to day basis

## 2013-03-03 NOTE — Patient Instructions (Addendum)
We will check a download & the pressure on your machine

## 2013-03-03 NOTE — Progress Notes (Signed)
  Subjective:    Patient ID: [name] [name], female    DOB: November 25, 1945, 67 y.o.   MRN: [id number]  HPI Primary Provider: Dr. [name], Collie Siad (ENT)   66/F, morbidly obese ex- smoker for FU of obstructive sleep apnea.  She has been maintained on BiPAP since 1994, initially 15/11.  6/04 >>wt 360 lbs >> BiPAP was re-titarted to 19/12 to abolish snoring although 15/11 was adequate for events.  CPAP was titrated again on 02/17/09 ([facility], Dr Radford Pax) to 13 cm >> she did not tolerate CPAP.  Supplier is [employer],uses small nasal mask with humidity  She has no regular sleep habits, moves around in a motorised scooter.  Baseline PSG 9/11 showed AHI 30/h, RDI 86/h & desaturation to 87%.  Data 8/17-8/30/11 >> autoBIPAP - avg pr 17/15, good compliance, no residuals.  autoBiPAP download 10/25-11/8/11 >> avg pr 18/14, good compliance, AHI 3/h, no leak   Hospitalised 11/2010 Urosepsis with calculi, required PCN, required mech vent x 6ds,  Took dT shot,     03/03/2013 69mFU Pt states she wearing her BIPAP everynight x 4.6-6.5 hrs a night. denies any problems w/ mask/machine.  mask ok, pressure ok, denies excessive daytime somnolence , no choking episodes, no bed partner hisotry available for objective data    Review of Systems neg for any significant sore throat, dysphagia, itching, sneezing, nasal congestion or excess/ purulent secretions, fever, chills, sweats, unintended wt loss, pleuritic or exertional cp, hempoptysis, orthopnea pnd or change in chronic leg swelling. Also denies presyncope, palpitations, heartburn, abdominal pain, nausea, vomiting, diarrhea or change in bowel or urinary habits, dysuria,hematuria, rash, arthralgias, visual complaints, headache, numbness weakness or ataxia.     Objective:   Physical Exam  Gen. Pleasant, obese, in no distress ENT - no lesions, no post nasal drip Neck: No JVD, no thyromegaly, no carotid bruits Lungs: no use of  accessory muscles, no dullness to percussion, decreased without rales or rhonchi  Cardiovascular: Rhythm regular, heart sounds  normal, no murmurs or gallops, no peripheral edema Musculoskeletal: No deformities, no cyanosis or clubbing , no tremors        Assessment & Plan:

## 2013-03-06 ENCOUNTER — Inpatient Hospital Stay (HOSPITAL_COMMUNITY)
Admission: EM | Admit: 2013-03-06 | Discharge: 2013-03-10 | DRG: 292 | Disposition: A | Discharge status: Home Health | Payer: Medicare Other | Attending: Family Medicine | Admitting: Family Medicine

## 2013-03-06 ENCOUNTER — Emergency Department (HOSPITAL_COMMUNITY): Payer: Medicare Other

## 2013-03-06 ENCOUNTER — Encounter (HOSPITAL_COMMUNITY): Payer: Self-pay

## 2013-03-06 ENCOUNTER — Telehealth: Payer: Self-pay | Admitting: Pulmonary Disease

## 2013-03-06 DIAGNOSIS — J309 Allergic rhinitis, unspecified: Secondary | ICD-10-CM | POA: Diagnosis present

## 2013-03-06 DIAGNOSIS — I509 Heart failure, unspecified: Secondary | ICD-10-CM | POA: Diagnosis present

## 2013-03-06 DIAGNOSIS — Z6841 Body Mass Index (BMI) 40.0 and over, adult: Secondary | ICD-10-CM | POA: Diagnosis not present

## 2013-03-06 DIAGNOSIS — Z885 Allergy status to narcotic agent status: Secondary | ICD-10-CM | POA: Diagnosis not present

## 2013-03-06 DIAGNOSIS — N183 Chronic kidney disease, stage 3 unspecified: Secondary | ICD-10-CM | POA: Diagnosis present

## 2013-03-06 DIAGNOSIS — I4821 Permanent atrial fibrillation: Secondary | ICD-10-CM | POA: Diagnosis present

## 2013-03-06 DIAGNOSIS — R0789 Other chest pain: Secondary | ICD-10-CM | POA: Diagnosis not present

## 2013-03-06 DIAGNOSIS — M1990 Unspecified osteoarthritis, unspecified site: Secondary | ICD-10-CM | POA: Diagnosis present

## 2013-03-06 DIAGNOSIS — Z880 Allergy status to penicillin: Secondary | ICD-10-CM | POA: Diagnosis not present

## 2013-03-06 DIAGNOSIS — M109 Gout, unspecified: Secondary | ICD-10-CM | POA: Diagnosis present

## 2013-03-06 DIAGNOSIS — I4891 Unspecified atrial fibrillation: Secondary | ICD-10-CM | POA: Diagnosis not present

## 2013-03-06 DIAGNOSIS — Z87891 Personal history of nicotine dependence: Secondary | ICD-10-CM | POA: Diagnosis not present

## 2013-03-06 DIAGNOSIS — I10 Essential (primary) hypertension: Secondary | ICD-10-CM | POA: Diagnosis present

## 2013-03-06 DIAGNOSIS — I5023 Acute on chronic systolic (congestive) heart failure: Principal | ICD-10-CM | POA: Diagnosis present

## 2013-03-06 DIAGNOSIS — I5021 Acute systolic (congestive) heart failure: Secondary | ICD-10-CM

## 2013-03-06 DIAGNOSIS — Z79899 Other long term (current) drug therapy: Secondary | ICD-10-CM

## 2013-03-06 DIAGNOSIS — R0989 Other specified symptoms and signs involving the circulatory and respiratory systems: Secondary | ICD-10-CM | POA: Diagnosis not present

## 2013-03-06 DIAGNOSIS — Z91040 Latex allergy status: Secondary | ICD-10-CM

## 2013-03-06 DIAGNOSIS — N185 Chronic kidney disease, stage 5: Secondary | ICD-10-CM | POA: Diagnosis present

## 2013-03-06 DIAGNOSIS — K219 Gastro-esophageal reflux disease without esophagitis: Secondary | ICD-10-CM | POA: Diagnosis present

## 2013-03-06 DIAGNOSIS — I495 Sick sinus syndrome: Secondary | ICD-10-CM | POA: Diagnosis present

## 2013-03-06 DIAGNOSIS — Z950 Presence of cardiac pacemaker: Secondary | ICD-10-CM | POA: Diagnosis not present

## 2013-03-06 DIAGNOSIS — I129 Hypertensive chronic kidney disease with stage 1 through stage 4 chronic kidney disease, or unspecified chronic kidney disease: Secondary | ICD-10-CM | POA: Diagnosis present

## 2013-03-06 DIAGNOSIS — I059 Rheumatic mitral valve disease, unspecified: Secondary | ICD-10-CM | POA: Diagnosis not present

## 2013-03-06 DIAGNOSIS — Z881 Allergy status to other antibiotic agents status: Secondary | ICD-10-CM | POA: Diagnosis not present

## 2013-03-06 DIAGNOSIS — Z888 Allergy status to other drugs, medicaments and biological substances status: Secondary | ICD-10-CM | POA: Diagnosis not present

## 2013-03-06 DIAGNOSIS — G4733 Obstructive sleep apnea (adult) (pediatric): Secondary | ICD-10-CM | POA: Diagnosis present

## 2013-03-06 DIAGNOSIS — I5042 Chronic combined systolic (congestive) and diastolic (congestive) heart failure: Secondary | ICD-10-CM | POA: Diagnosis present

## 2013-03-06 DIAGNOSIS — R0602 Shortness of breath: Secondary | ICD-10-CM | POA: Diagnosis not present

## 2013-03-06 LAB — CBC WITH DIFFERENTIAL/PLATELET
Basophils Relative: 0 % (ref 0–1)
Eosinophils Absolute: 0.1 10*3/uL (ref 0.0–0.7)
Hemoglobin: 12.9 g/dL (ref 12.0–15.0)
MCH: 31 pg (ref 26.0–34.0)
MCHC: 32.5 g/dL (ref 30.0–36.0)
Monocytes Relative: 8 % (ref 3–12)
Neutrophils Relative %: 74 % (ref 43–77)
Platelets: 172 10*3/uL (ref 150–400)
RDW: 15.4 % (ref 11.5–15.5)
WBC: 9.7 10*3/uL (ref 4.0–10.5)

## 2013-03-06 LAB — URINALYSIS, ROUTINE W REFLEX MICROSCOPIC
Glucose, UA: NEGATIVE mg/dL
Hgb urine dipstick: NEGATIVE
Ketones, ur: NEGATIVE mg/dL
Leukocytes, UA: NEGATIVE
Protein, ur: NEGATIVE mg/dL

## 2013-03-06 LAB — BASIC METABOLIC PANEL
BUN: 41 mg/dL — ABNORMAL HIGH (ref 6–23)
GFR calc Af Amer: 45 mL/min — ABNORMAL LOW (ref >90)
GFR calc non Af Amer: 39 mL/min — ABNORMAL LOW (ref >90)
Potassium: 4.7 mEq/L (ref 3.5–5.1)

## 2013-03-06 MED ORDER — SODIUM CHLORIDE 0.9 % IV SOLN
250.0000 mL | INTRAVENOUS | Status: DC | PRN
  Administered 2013-03-08: 250 mL via INTRAVENOUS

## 2013-03-06 MED ORDER — SODIUM CHLORIDE 0.9 % IJ SOLN: 3.0000 mL | INTRAMUSCULAR | Status: DC | PRN

## 2013-03-06 MED ORDER — COLCHICINE 0.6 MG PO TABS
0.6000 mg | ORAL_TABLET | Freq: Every day | ORAL | Status: DC
  Administered 2013-03-07 – 2013-03-10 (×4): 0.6 mg via ORAL
  Filled 2013-03-06 (×4): qty 1

## 2013-03-06 MED ORDER — LISINOPRIL 10 MG PO TABS
10.0000 mg | ORAL_TABLET | Freq: Every day | ORAL | Status: DC
  Administered 2013-03-07 – 2013-03-10 (×4): 10 mg via ORAL
  Filled 2013-03-06 (×4): qty 1

## 2013-03-06 MED ORDER — ALLOPURINOL 300 MG PO TABS
300.0000 mg | ORAL_TABLET | Freq: Every day | ORAL | Status: DC
  Administered 2013-03-08 – 2013-03-10 (×3): 300 mg via ORAL
  Filled 2013-03-06 (×3): qty 1

## 2013-03-06 MED ORDER — HYDROCODONE-ACETAMINOPHEN 10-325 MG PO TABS
1.0000 | ORAL_TABLET | Freq: Three times a day (TID) | ORAL | Status: DC
  Administered 2013-03-06 – 2013-03-10 (×11): 1 via ORAL
  Filled 2013-03-06 (×11): qty 1

## 2013-03-06 MED ORDER — ENOXAPARIN SODIUM 40 MG/0.4ML ~~LOC~~ SOLN
40.0000 mg | SUBCUTANEOUS | Status: DC
  Administered 2013-03-07 – 2013-03-08 (×2): 40 mg via SUBCUTANEOUS
  Filled 2013-03-06 (×2): qty 0.4

## 2013-03-06 MED ORDER — PANTOPRAZOLE SODIUM 40 MG PO TBEC
40.0000 mg | DELAYED_RELEASE_TABLET | Freq: Every day | ORAL | Status: DC
  Administered 2013-03-07 – 2013-03-10 (×4): 40 mg via ORAL
  Filled 2013-03-06 (×4): qty 1

## 2013-03-06 MED ORDER — FUROSEMIDE 10 MG/ML IJ SOLN
60.0000 mg | Freq: Two times a day (BID) | INTRAMUSCULAR | Status: DC
  Administered 2013-03-07 – 2013-03-09 (×6): 60 mg via INTRAVENOUS
  Filled 2013-03-06 (×7): qty 6

## 2013-03-06 MED ORDER — ASPIRIN 325 MG PO TABS
325.0000 mg | ORAL_TABLET | Freq: Every day | ORAL | Status: DC
  Administered 2013-03-07 – 2013-03-10 (×4): 325 mg via ORAL
  Filled 2013-03-06 (×4): qty 1

## 2013-03-06 MED ORDER — ACETAMINOPHEN 325 MG PO TABS: 650.0000 mg | ORAL_TABLET | ORAL | Status: DC | PRN

## 2013-03-06 MED ORDER — FUROSEMIDE 10 MG/ML IJ SOLN
40.0000 mg | Freq: Once | INTRAMUSCULAR | Status: AC
  Administered 2013-03-06: 40 mg via INTRAVENOUS
  Filled 2013-03-06: qty 4

## 2013-03-06 MED ORDER — ONDANSETRON HCL 4 MG/2ML IJ SOLN: 4.0000 mg | INTRAMUSCULAR | Status: DC | PRN

## 2013-03-06 MED ORDER — SODIUM CHLORIDE 0.9 % IJ SOLN
3.0000 mL | Freq: Two times a day (BID) | INTRAMUSCULAR | Status: DC
  Administered 2013-03-07 – 2013-03-10 (×8): 3 mL via INTRAVENOUS

## 2013-03-06 MED ORDER — POTASSIUM CHLORIDE CRYS ER 20 MEQ PO TBCR
40.0000 meq | EXTENDED_RELEASE_TABLET | Freq: Three times a day (TID) | ORAL | Status: DC
  Administered 2013-03-07: 40 meq via ORAL
  Filled 2013-03-06: qty 2

## 2013-03-06 NOTE — ED Notes (Signed)
Per ems, pt reports sob with activity.  Per ems, pt states that O2 sats upon arrival were 97%.  Pt placed on non-rebreather during transport.

## 2013-03-06 NOTE — ED Provider Notes (Signed)
History     CSN: [id number]  Arrival date & time 03/06/13  1706   First MD Initiated Contact with Patient 03/06/13 1719      Chief Complaint  Patient presents with  . Shortness of Breath    (Consider location/radiation/quality/duration/timing/severity/associated sxs/prior treatment) Patient is a 67 y.o. female presenting with shortness of breath. The history is provided by the patient.  Shortness of Breath Severity:  Severe Onset quality:  Gradual Associated symptoms: no abdominal pain, no chest pain, no headaches, no rash, no vomiting and no wheezing    patient with shortness of breath over the last few days. She states it feels like her CHF. She states she's been able to walk as far she normally does. She states she would go a few feet and severe difficulty breathing. A chest pain. She states she's more swelling or legs. She states she weighs 325 pounds. She states she does not weigh herself regularly. She has a history of CHF and COPD. No cough. Patient states that when her lungs are listened to the do not sound wet the x-ray will show CHF. She states she's had a nonproductive cough. No change in her medications.  Past Medical History  Diagnosis Date  . SYSTOLIC chf,EF 29-93%   . Atrial fibrillation   . Hypertension   . Bursitis   . GERD (gastroesophageal reflux disease)   . Cancer   . Cataract   . Gallstones   . Kidney stones   . Sleep apnea   . CPAP/BiPAP dependent     at night   . Morbid obesity   . Chronic cellulitis   . Degenerative joint disease   . Gout   . Diverticulitis   . Blood transfusion   . Anemia   . Sinoatrial node dysfunction     Past Surgical History  Procedure Laterality Date  . Abdominal hysterectomy  1991  . Pacemaker insertion  Nov 2000  . Eye surgery    . Percutaneous nephrolithotomy  April 2012    No family history on file.  History  Substance Use Topics  . Smoking status: Former Smoker -- 1.00 packs/day    Types: Cigarettes   Quit date: 09/25/1974  . Smokeless tobacco: Never Used  . Alcohol Use: No    OB History   Grav Para Term Preterm Abortions TAB SAB Ect Mult Living                  Review of Systems  Constitutional: Positive for fatigue. Negative for activity change and appetite change.  HENT: Negative for neck stiffness.   Eyes: Negative for pain.  Respiratory: Positive for shortness of breath. Negative for chest tightness and wheezing.   Cardiovascular: Positive for leg swelling. Negative for chest pain.  Gastrointestinal: Negative for nausea, vomiting, abdominal pain and diarrhea.  Genitourinary: Negative for flank pain.  Musculoskeletal: Negative for back pain.  Skin: Negative for rash.  Neurological: Negative for weakness, numbness and headaches.  Psychiatric/Behavioral: Negative for behavioral problems.    Allergies  Beta adrenergic blockers; Ciprofloxacin; Codeine; Latex; Penicillins; Peppermint oil; Prednisone; and Tape  Home Medications   No current outpatient prescriptions on file.  BP 142/109  Pulse 90  Temp(Src) 98.1 F (36.7 C) (Oral)  Resp 20  Ht 5' 6"  (1.676 m)  Wt 343 lb 11.2 oz (155.9 kg)  BMI 55.5 kg/m2  SpO2 96%  Physical Exam  Nursing note and vitals reviewed. Constitutional: She is oriented to person, place, and time. She appears well-developed  and well-nourished.  Patient is morbidly obese  HENT:  Head: Normocephalic and atraumatic.  Eyes: EOM are normal. Pupils are equal, round, and reactive to light.  Neck: Normal range of motion. Neck supple.  Cardiovascular: Normal rate, regular rhythm and normal heart sounds.   No murmur heard. Pulmonary/Chest: Effort normal and breath sounds normal. No respiratory distress. She has no wheezes. She has no rales.  Abdominal: Soft. Bowel sounds are normal. She exhibits no distension. There is no tenderness. There is no rebound and no guarding.  Musculoskeletal: Normal range of motion. She exhibits edema.  Edema chronic  venous insufficiency in bilateral lower legs  Neurological: She is alert and oriented to person, place, and time. No cranial nerve deficit.  Skin: Skin is warm and dry.  Psychiatric: She has a normal mood and affect. Her speech is normal.    ED Course  Procedures (including critical care time)  Labs Reviewed  BASIC METABOLIC PANEL - Abnormal; Notable for the following:    BUN 41 (*)    Creatinine, Ser 1.36 (*)    GFR calc non Af Amer 39 (*)    GFR calc Af Amer 45 (*)    All other components within normal limits  PRO B NATRIURETIC PEPTIDE - Abnormal; Notable for the following:    Pro B Natriuretic peptide (BNP) 5412.0 (*)    All other components within normal limits  CBC WITH DIFFERENTIAL  TROPONIN I  URINALYSIS, ROUTINE W REFLEX MICROSCOPIC  TSH  BASIC METABOLIC PANEL   Dg Chest 2 View  03/06/2013   *RADIOLOGY REPORT*  Clinical Data: Shortness of breath for 3 weeks, history of CHF, former smoker  CHEST - 2 VIEW  Comparison: 05/07/2011; 01/02/2011  Findings:  The lateral radiograph is degraded secondary to exclusion of the bilateral costophrenic angles.  Grossly unchanged enlarged cardiac silhouette and mediastinal contours.  Stable position of support apparatus.  There is chronic mild pulmonary congestion without frank evidence of edema.  Grossly unchanged perihilar heterogeneous opacities.  No new focal airspace opacities. There is chronic blunting of the right costophrenic angle.  No definite pleural effusion or pneumothorax.  Unchanged bones.  IMPRESSION: Cardiomegaly and chronic pulmonary venous congestion without acute cardiopulmonary disease.   Original Report Authenticated By: Jake Seats, MD     1. CHF exacerbation   2. Morbid obesity     Date: 03/07/2013  Rate: 98  Rhythm: paced  QRS Axis: normal  Intervals: paced  ST/T Wave abnormalities: normal  Conduction Disutrbances:none  Narrative Interpretation:   Old EKG Reviewed: unchanged     MDM  Patient with  increasing shortness of breath some chest pain. BNP is elevated. Her weight has gone from a baseline of 325 to 344.   Patient is a history of CHF. She'll be admitted to internal medicine for further evaluation treatment.        Jasper Riling. Alvino Chapel, MD 03/07/13 6144

## 2013-03-06 NOTE — Telephone Encounter (Signed)
I spoke with carol. She staetd pt bipap is from 1990's. Issue is pt has medicare and bc she had a study done at dr. Theodosia Blender office prior to seeing RA it showed she needed CPAP not BIPAP. She stated pt has not had sleep study since. I advise dher pt did have sleep study 2011 and this has been faxed over to carol. She will take a look at this and let us know if she needs anything further

## 2013-03-06 NOTE — H&P (Signed)
[facility] History and Physical  [name] [name]  WCB:[id number]  DOB: 1946-06-01   DOA: 03/06/2013   PCP:   [name], MD   Chief Complaint:  Progressive shortness of breath over 4 days  HPI: [name] [name] is a 67 y.o. female.   Morbidly obese Caucasian lady with a history of systolic heart failure, maintained on diuretics and fluid restriction, presents to the emergency room with the above complaints. Her daughter who is with her reports that this happens about every 3 years. She reports compliance with medications and diet, but does not do self every single day, and reports about a 25 pound weight gain over the past 4 days associated with severe shortness of breath.  She denies chest pain dizziness or diaphoresis; she denies wheezing. In the emergency room her beta-type natruretic peptide was noted to be markedly elevated and the hospitalist service was called to assist.  She usually takes 20 mg of Lasix once or twice per day but took 60 mg today because of the increased weight.  The patient is particular to point out that she does not want her medications to be changed, because her cardiologist Dr. Lovena Le was very upset, though recently discharged when her medications have been changed without his consulting, and she does not want beta blockers because they nearly killed her the last time she had them.   Rewiew of Systems:   All systems negative except as marked bold or noted in the HPI;  Constitutional:    malaise, fever and chills. ;  Eyes:   eye pain, redness and discharge. ;  ENMT:   ear pain, hoarseness, nasal congestion, sinus pressure and dry throat. ;  Cardiovascular:    chest tight palpitations, diaphoresis, dyspnea and peripheral edema.  Respiratory:   Dry cough, hemoptysis, wheezing and stridor. ;  Gastrointestinal:  nausea, vomiting, diarrhea, constipation, abdominal pain, melena, blood in stool, hematemesis, jaundice and rectal bleeding. unusual weight  loss..   Genitourinary:    frequency, dysuria, incontinence,flank pain and hematuria; Musculoskeletal:   back pain and neck pain.  swelling and trauma.;  Skin: .  pruritus, rash, abrasions, bruising and skin lesion.; ulcerations Neuro:    headache, lightheadedness and neck stiffness.  weakness, altered level of consciousness, altered mental status, extremity weakness, burning feet, involuntary movement, seizure and syncope.  Psych:    anxiety, depression, insomnia, tearfulness, panic attacks, hallucinations, paranoia, suicidal or homicidal ideation    Past Medical History  Diagnosis Date  . SYSTOLIC chf,EF 61-60%   . Atrial fibrillation   . Hypertension   . Bursitis   . GERD (gastroesophageal reflux disease)   . Cancer   . Cataract   . Gallstones   . Kidney stones   . Sleep apnea   . CPAP/BiPAP dependent     at night   . Morbid obesity   . Chronic cellulitis   . Degenerative joint disease   . Gout   . Diverticulitis   . Blood transfusion   . Anemia   . Sinoatrial node dysfunction     Past Surgical History  Procedure Laterality Date  . Abdominal hysterectomy  1991  . Pacemaker insertion  Nov 2000  . Eye surgery    . Percutaneous nephrolithotomy  April 2012    Medications:  HOME MEDS: Prior to Admission medications   Medication Sig Start Date End Date Taking? Authorizing Provider  allopurinol (ZYLOPRIM) 300 MG tablet Take 300 mg by mouth daily.     Yes Historical Provider, MD  aspirin 325 MG tablet Take 325 mg by mouth daily.     Yes Historical Provider, MD  calcium gluconate 500 MG tablet Take 500 mg by mouth daily.   Yes Historical Provider, MD  COD LIVER OIL PO Take 1 tablet by mouth daily.     Yes Historical Provider, MD  colchicine 0.6 MG tablet Take 0.6 mg by mouth daily.    Yes Historical Provider, MD  colchicine-probenecid 0.5-500 MG per tablet Take 1 tablet by mouth 2 (two) times daily as needed. Gout flare up 02/24/13  Yes Historical Provider, MD  furosemide  (LASIX) 20 MG tablet Take 20 mg by mouth 2 (two) times daily as needed. Fluid retention   Yes Historical Provider, MD  HYDROcodone-acetaminophen (NORCO) 10-325 MG per tablet Take 1 tablet by mouth 3 (three) times daily.     Yes Historical Provider, MD  lisinopril (PRINIVIL,ZESTRIL) 10 MG tablet Take 10 mg by mouth daily.     Yes Historical Provider, MD  Multiple Vitamin (MULTIVITAMIN) tablet Take 1 tablet by mouth daily.   Yes Historical Provider, MD  NON FORMULARY bpap As directed   Yes Historical Provider, MD  omeprazole (PRILOSEC) 20 MG capsule Take 20 mg by mouth daily.     Yes Historical Provider, MD  potassium chloride SA (K-DUR,KLOR-CON) 20 MEQ tablet Take 20 mEq by mouth 3 (three) times daily.     Yes Historical Provider, MD     Allergies:  Allergies  Allergen Reactions  . Beta Adrenergic Blockers     REACTION: bradycardia  . Ciprofloxacin     REACTION: hives  . Codeine     REACTION: throat swelling  . Latex     REACTION: rash, itching  . Penicillins     REACTION: hives  . Peppermint Oil   . Prednisone     REACTION: swelling, S.O.B.    Social History:   reports that she quit smoking about 38 years ago. Her smoking use included Cigarettes. She smoked 1.00 pack per day. She has never used smokeless tobacco. She reports that she does not drink alcohol or use illicit drugs.  Family History: No family history on file.   Physical Exam: Filed Vitals:   03/06/13 1751 03/06/13 1800 03/06/13 1916 03/06/13 1930  BP:  160/91 151/81 151/81  Pulse:  96 90   Temp:    98 F (36.7 C)  TempSrc:      Resp:  17  18  Weight: 156.31 kg (344 lb 9.6 oz)     SpO2:  95% 95% 97%   Blood pressure 151/81, pulse 90, temperature 98 F (36.7 C), temperature source Oral, resp. rate 18, weight 156.31 kg (344 lb 9.6 oz), SpO2 97.00%.  GEN:  Pleasant but morbidly obese Caucasian lady lying bed in no acute  distress; she is nontoxic in appearance cooperative with exam PSYCH:  alert and  oriented x4;  neither anxious nor depressed; affect is appropriate. HEENT: Mucous membranes pink and anicteric; PERRLA; EOM intact; thick neck  Breasts:: Not examined CHEST WALL: No tenderness CHEST: Normal respiration, clear to auscultation bilaterally HEART: Regular rate and rhythm; no murmurs rubs or gallops ABDOMEN: Obese, soft non-tender; no masses, no organomegaly, normal abdominal bowel sounds; large pannus;  Rectal Exam: Not done EXTREMITIES:  age-appropriate arthropathy of the hands and knees;  edema; no ulcerations. Genitalia: not examined PULSES: 2+ and symmetric SKIN: Normal hydration no rash or ulceration CNS: Cranial nerves 2-12 grossly intact no focal lateralizing neurologic deficit   Labs on Admission:  Basic Metabolic Panel:  Recent Labs Lab 03/06/13 1801  NA 140  K 4.7  CL 101  CO2 27  GLUCOSE 98  BUN 41*  CREATININE 1.36*  CALCIUM 9.4   Liver Function Tests: No results found for this basename: AST, ALT, ALKPHOS, BILITOT, PROT, ALBUMIN,  in the last 168 hours No results found for this basename: LIPASE, AMYLASE,  in the last 168 hours No results found for this basename: AMMONIA,  in the last 168 hours CBC:  Recent Labs Lab 03/06/13 1801  WBC 9.7  NEUTROABS 7.2  HGB 12.9  HCT 39.7  MCV 95.4  PLT 172   Cardiac Enzymes:  Recent Labs Lab 03/06/13 1801  TROPONINI <0.30   BNP: No components found with this basename: POCBNP,  D-dimer: No components found with this basename: D-DIMER,  CBG: No results found for this basename: GLUCAP,  in the last 168 hours  Radiological Exams on Admission: Dg Chest 2 View  03/06/2013   *RADIOLOGY REPORT*  Clinical Data: Shortness of breath for 3 weeks, history of CHF, former smoker  CHEST - 2 VIEW  Comparison: 05/07/2011; 01/02/2011  Findings:  The lateral radiograph is degraded secondary to exclusion of the bilateral costophrenic angles.  Grossly unchanged enlarged cardiac silhouette and mediastinal contours.   Stable position of support apparatus.  There is chronic mild pulmonary congestion without frank evidence of edema.  Grossly unchanged perihilar heterogeneous opacities.  No new focal airspace opacities. There is chronic blunting of the right costophrenic angle.  No definite pleural effusion or pneumothorax.  Unchanged bones.  IMPRESSION: Cardiomegaly and chronic pulmonary venous congestion without acute cardiopulmonary disease.   Original Report Authenticated By: Jake Seats, MD      Assessment/Plan    Principal Problem:   CHF exacerbation Active Problems:   OBSTRUCTIVE SLEEP APNEA   HYPERTENSION   ALLERGIC RHINITIS   PPM-St.Jude   Morbid obesity   Sinoatrial node dysfunction  PLAN: Will start 60 mg of Lasix intravenously twice daily, with increased potassium supplementation; continue current dose of lisinopril; fluid restriction and all other aspects of the heart failure 0pathway.  Consult cardiology for assistance  Get her BiPAP machine from home to continue BiPAP management of her obstructive sleep apnea  Continue management of chronic medical problem  Other plans as per orders.  Code Status: Full code Family Communication: Plans discuss with patient and family at bedside Disposition Plan:   Likely home in a day or 2   Johnryan Sao Nocturnist [facility] Pager [phone]   03/06/2013, 7:54 PM

## 2013-03-07 DIAGNOSIS — Z950 Presence of cardiac pacemaker: Secondary | ICD-10-CM

## 2013-03-07 DIAGNOSIS — N185 Chronic kidney disease, stage 5: Secondary | ICD-10-CM | POA: Diagnosis present

## 2013-03-07 DIAGNOSIS — I059 Rheumatic mitral valve disease, unspecified: Secondary | ICD-10-CM | POA: Diagnosis not present

## 2013-03-07 DIAGNOSIS — G4733 Obstructive sleep apnea (adult) (pediatric): Secondary | ICD-10-CM | POA: Diagnosis not present

## 2013-03-07 DIAGNOSIS — I4891 Unspecified atrial fibrillation: Secondary | ICD-10-CM | POA: Diagnosis not present

## 2013-03-07 DIAGNOSIS — I509 Heart failure, unspecified: Secondary | ICD-10-CM | POA: Diagnosis not present

## 2013-03-07 DIAGNOSIS — I5021 Acute systolic (congestive) heart failure: Secondary | ICD-10-CM

## 2013-03-07 LAB — BASIC METABOLIC PANEL
BUN: 37 mg/dL — ABNORMAL HIGH (ref 6–23)
Chloride: 101 mEq/L (ref 96–112)
GFR calc Af Amer: 47 mL/min — ABNORMAL LOW (ref >90)
Glucose, Bld: 96 mg/dL (ref 70–99)
Potassium: 4.1 mEq/L (ref 3.5–5.1)
Sodium: 140 mEq/L (ref 135–145)

## 2013-03-07 MED ORDER — POTASSIUM CHLORIDE CRYS ER 20 MEQ PO TBCR
20.0000 meq | EXTENDED_RELEASE_TABLET | Freq: Three times a day (TID) | ORAL | Status: DC
  Administered 2013-03-07 (×2): 20 meq via ORAL
  Filled 2013-03-07 (×2): qty 1

## 2013-03-07 NOTE — Consult Note (Signed)
Patient ID: [name] [name] MRN: [id number], DOB/AGE: 12-18-1945   Admit date: 03/06/2013 Date of Consult: @TODAY @  Primary Physician: [name], MD Primary Cardiologist: Mora Bellman List: Past Medical History  Diagnosis Date  . SYSTOLIC chf,EF 82-99%   . Atrial fibrillation   . Hypertension   . Bursitis   . GERD (gastroesophageal reflux disease)   . Cancer   . Cataract   . Gallstones   . Kidney stones   . Sleep apnea   . CPAP/BiPAP dependent     at night   . Morbid obesity   . Chronic cellulitis   . Degenerative joint disease   . Gout   . Diverticulitis   . Blood transfusion   . Anemia   . Sinoatrial node dysfunction     Past Surgical History  Procedure Laterality Date  . Abdominal hysterectomy  1991  . Pacemaker insertion  Nov 2000  . Eye surgery    . Percutaneous nephrolithotomy  April 2012     Allergies:  Allergies  Allergen Reactions  . Beta Adrenergic Blockers     REACTION: bradycardia  . Ciprofloxacin     REACTION: hives  . Codeine     REACTION: throat swelling  . Latex     REACTION: rash, itching  . Penicillins     REACTION: hives  . Peppermint Oil   . Prednisone     REACTION: swelling, S.O.B.  . 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  Last echo in 2012:  LVEF 35 to 40% with severe hypokinesis/akiensis of the mid/distal anteorseptum and dyskinesis of apex.  She was last seen by Dr Lovena Le in Dec 2013.  Cardiac catheterization in 2009 showed normal coronary arteries.  She reports having another catheterization as well  REports normal  The patient says over the past few wks she has noted her wt is increased.  Does not weigh at home (hard for her to get on scale)  Her weight has been up at [place].  He clothes are loose so she does not notice a change in  how they fit.  She does notice increased swelling in her legs.   Has had some chest tightness that she gets when fluid is up.  Also some back pain that gets better when she sits up No dizziness.  No syncope.    Inpatient Medications:  . allopurinol  300 mg Oral Daily  . aspirin  325 mg Oral Daily  . colchicine  0.6 mg Oral Daily  . enoxaparin (LOVENOX) injection  40 mg Subcutaneous Q24H  . furosemide  60 mg Intravenous BID WC  . HYDROcodone-acetaminophen  1 tablet Oral TID  . lisinopril  10 mg Oral Daily  . pantoprazole  40 mg Oral Daily  . potassium chloride  40 mEq Oral TID PC  . sodium chloride  3 mL Intravenous Q12H    No family history on file.   History   Social History  . Marital Status: Widowed    Spouse Name: N/A    Number of Children: N/A  . Years of Education: N/A   Occupational History  . Not on file.   Social History Main Topics  . Smoking status: Former Smoker -- 1.00 packs/day    Types: Cigarettes    Quit date:  09/25/1974  . Smokeless tobacco: Never Used  . Alcohol Use: No  . Drug Use: No  . Sexually Active: No   Other Topics Concern  . Not on file   Social History Narrative  . No narrative on file     Review of Systems:  All other systems reviewed and are otherwise negative except as noted above.  Physical Exam: Filed Vitals:   03/07/13 0534  BP: 145/61  Pulse: 98  Temp: 97.3 F (36.3 C)  Resp: 20    Intake/Output Summary (Last 24 hours) at 03/07/13 0901 Last data filed at 03/07/13 0836  Gross per 24 hour  Intake    600 ml  Output   2600 ml  Net  -2000 ml    General:  Patient is a morbidly obese 67 yo in NAD. Head: Normocephalic, atraumatic, sclera non-icteric Neck: Negative for carotid bruits. JVP difficult to assess. Lungs: Clear bilaterally to auscultation without wheezes, rales, or rhonchi. Breathing is unlabored. Heart: RRR with S1 S2. Gr III/ VI systolic murmur LSB Abdomen: Obese.  Mild tenderness RUQ  NO rebound  No  obvious masses.   Msk:  Strength and tone appears normal for age. Extremities: No clubbing, cyanosis 2+ edema.  Distal pedal pulses are 2+ and equal bilaterally. Neuro: Alert and oriented X 3. Moves all extremities spontaneously. Psych:  Responds to questions appropriately with a normal affect.  Labs: Results for orders placed during the hospital encounter of 03/06/13 (from the past 24 hour(s))  CBC WITH DIFFERENTIAL     Status: None   Collection Time    03/06/13  6:01 PM      Result Value Range   WBC 9.7  4.0 - 10.5 K/uL   RBC 4.16  3.87 - 5.11 MIL/uL   Hemoglobin 12.9  12.0 - 15.0 g/dL   HCT 39.7  36.0 - 46.0 %   MCV 95.4  78.0 - 100.0 fL   MCH 31.0  26.0 - 34.0 pg   MCHC 32.5  30.0 - 36.0 g/dL   RDW 15.4  11.5 - 15.5 %   Platelets 172  150 - 400 K/uL   Neutrophils Relative % 74  43 - 77 %   Neutro Abs 7.2  1.7 - 7.7 K/uL   Lymphocytes Relative 16  12 - 46 %   Lymphs Abs 1.6  0.7 - 4.0 K/uL   Monocytes Relative 8  3 - 12 %   Monocytes Absolute 0.8  0.1 - 1.0 K/uL   Eosinophils Relative 1  0 - 5 %   Eosinophils Absolute 0.1  0.0 - 0.7 K/uL   Basophils Relative 0  0 - 1 %   Basophils Absolute 0.0  0.0 - 0.1 K/uL  BASIC METABOLIC PANEL     Status: Abnormal   Collection Time    03/06/13  6:01 PM      Result Value Range   Sodium 140  135 - 145 mEq/L   Potassium 4.7  3.5 - 5.1 mEq/L   Chloride 101  96 - 112 mEq/L   CO2 27  19 - 32 mEq/L   Glucose, Bld 98  70 - 99 mg/dL   BUN 41 (*) 6 - 23 mg/dL   Creatinine, Ser 1.36 (*) 0.50 - 1.10 mg/dL   Calcium 9.4  8.4 - 10.5 mg/dL   GFR calc non Af Amer 39 (*) >90 mL/min   GFR calc Af Amer 45 (*) >90 mL/min  PRO B NATRIURETIC PEPTIDE  Status: Abnormal   Collection Time    03/06/13  6:01 PM      Result Value Range   Pro B Natriuretic peptide (BNP) 5412.0 (*) 0 - 125 pg/mL  TROPONIN I     Status: None   Collection Time    03/06/13  6:01 PM      Result Value Range   Troponin I <0.30  <0.30 ng/mL  URINALYSIS, ROUTINE W REFLEX  MICROSCOPIC     Status: None   Collection Time    03/06/13  8:35 PM      Result Value Range   Color, Urine YELLOW  YELLOW   APPearance CLEAR  CLEAR   Specific Gravity, Urine 1.020  1.005 - 1.030   pH 6.0  5.0 - 8.0   Glucose, UA NEGATIVE  NEGATIVE mg/dL   Hgb urine dipstick NEGATIVE  NEGATIVE   Bilirubin Urine NEGATIVE  NEGATIVE   Ketones, ur NEGATIVE  NEGATIVE mg/dL   Protein, ur NEGATIVE  NEGATIVE mg/dL   Urobilinogen, UA 0.2  0.0 - 1.0 mg/dL   Nitrite NEGATIVE  NEGATIVE   Leukocytes, UA NEGATIVE  NEGATIVE  BASIC METABOLIC PANEL     Status: Abnormal   Collection Time    03/07/13  4:48 AM      Result Value Range   Sodium 140  135 - 145 mEq/L   Potassium 4.1  3.5 - 5.1 mEq/L   Chloride 101  96 - 112 mEq/L   CO2 30  19 - 32 mEq/L   Glucose, Bld 96  70 - 99 mg/dL   BUN 37 (*) 6 - 23 mg/dL   Creatinine, Ser 1.32 (*) 0.50 - 1.10 mg/dL   Calcium 9.1  8.4 - 10.5 mg/dL   GFR calc non Af Amer 40 (*) >90 mL/min   GFR calc Af Amer 47 (*) >90 mL/min    Radiology/Studies: Dg Chest 2 View  03/06/2013   *RADIOLOGY REPORT*  Clinical Data: Shortness of breath for 3 weeks, history of CHF, former smoker  CHEST - 2 VIEW  Comparison: 05/07/2011; 01/02/2011  Findings:  The lateral radiograph is degraded secondary to exclusion of the bilateral costophrenic angles.  Grossly unchanged enlarged cardiac silhouette and mediastinal contours.  Stable position of support apparatus.  There is chronic mild pulmonary congestion without frank evidence of edema.  Grossly unchanged perihilar heterogeneous opacities.  No new focal airspace opacities. There is chronic blunting of the right costophrenic angle.  No definite pleural effusion or pneumothorax.  Unchanged bones.  IMPRESSION: Cardiomegaly and chronic pulmonary venous congestion without acute cardiopulmonary disease.   Original Report Authenticated By: Jake Seats, MD    EKG:  Ventrcular paced 98 bpm.  ASSESSMENT AND PLAN:   Patient is a 67 yo with  history of morbid obesity, atrial fib and NICM.   Lives alone  PResents with increased LE edema and wt gain that she has noticed  Denies signif SOB On exam she does have some signs of fluid overload.  Agree with IV lasix to diurese Patient refuse b blocker  Says her heart nearly stopped, even after pacemaker.  "It nearly killed me"  On ACE I Follow I/Os.   I do not think there is evid for ischemia  Normal coronary arteries on cath x 2.  2.  Atrial fib  S/p AVN ablation.  Refuses coumadin.  COntinue ASA  3.  Sleep apnea  Continue CPAP.  4.  Obesity.    Patient lives alone  Gets some help.  She is followed a few times per year by Dr Lovena Le.  I suggested getting another cardiologist in addition since he focuses on rhythm issues; someone who can titrate CHF meds as needed  She does not want to do this now.     Richrd Humbles 03/07/2013, 9:01 AM

## 2013-03-07 NOTE — Progress Notes (Signed)
Patient seen, independently examined and chart reviewed. I agree with exam, assessment and plan discussed with [name] Carrel, NP.  Interval history/Subjective: Feels better. Still short of breath.  Objective: Afebrile, stable vital signs.  Labs/studies: No significant change in creatinine.  Assessment:  Acute systolic congestive heart failure  Chronic atrial fibrillation--refuses Coumadin  History complete heart block, status post permanent pacemaker placement  Obstructive sleep apnea, CPAP  Plan:  Continue management of heart failure per cardiology, patient refuses beta blocker. Continue ACE inhibitor.  Continue CPAP for sleep apnea.  Summary: 67 year old woman present with weight gain and increasing shortness of breath. Admitted for acute systolic congestive heart failure.  [name] Hodgkins, MD [facility] [phone]

## 2013-03-07 NOTE — Care Management Note (Signed)
    Page 1 of 1   03/10/2013     5:14:11 PM   CARE MANAGEMENT NOTE 03/10/2013  Patient:  [name] [name], [name] [name]   Account Number:  [id number]  Date Initiated:  03/07/2013  Documentation initiated by:  Vladimir Creeks  Subjective/Objective Assessment:   Admitted with CHF exacerbation. Pt is from home, lives alone, but daughter is nearby. She has a walker and uses this as needed. States she does fine at home, with daughter assisting if needed. Denies needs at present. Plans to return home     Action/Plan:   May need HH to follow for disease management- will follow   Anticipated DC Date:  03/10/2013   Anticipated DC Plan:  [place]  CM consult      Choice offered to / List presented to:             Status of service:  Completed, signed off Medicare Important Message given?   (If response is "NO", the following Medicare IM given date fields will be blank) Date Medicare IM given:   Date Additional Medicare IM given:    Discharge Disposition:  HOME/SELF CARE  Per UR Regulation:  Reviewed for med. necessity/level of care/duration of stay  If discussed at [place] of Stay Meetings, dates discussed:    Comments:  03/10/13 1000 , Vladimir Creeks RN/CM patient is a retired CMA she has a  blood pressure cuff, an electric scale,as allowed by her MD to adjust her diuretics up and down is needed, and is not been in the hospital in three years so she does not feel that she needs home health. 03/07/13 1300 Vladimir Creeks RN/CM

## 2013-03-07 NOTE — Progress Notes (Signed)
[facility] PROGRESS NOTE  COOKIE PORE MEQ:[id number] DOB: 01-Jan-1946 DOA: 03/06/2013 PCP: [name], MD  Assessment/Plan: Acute on chronic systolic HF:  Echo in 2979  35%. Improved since admission. Continue IV lasix. Refuses BB. Repeat echo pending. Volume status -2.6L.Wt 154.3kg down from 155.9kg on admission. Appreciate cardiology assistance  Afib: rate controlled. Refusing coumadin. S/p AVN ablation. Appreciate cards assistance  Chronic renal failure II-III. Chart review indicates current creatinine of 1.3 very close to baseline. Will monitor.    Active Problems:  OBSTRUCTIVE SLEEP APNEA : CPAP from home. Tolerating well.   HYPERTENSION : fair control. Continue ACE Inhibitor   Gout: stable at baseline. Continue home meds.    Morbid obesity: BMI 55.6. Nutritional consult   Sinoatrial node dysfunction: pt with pacemaker  Code Status: full Family Communication: none available Disposition Plan: home when ready hopefully day or 2   Consultants:  cardiology  Procedures:  noe  Antibiotics:  none  HPI/Subjective: Denies pain/discomfort. Offended that she is labeled "fall risk"  Objective: Filed Vitals:   03/07/13 0200 03/07/13 0534 03/07/13 0700 03/07/13 1025  BP: 132/75 145/61  128/96  Pulse: 90 98  106  Temp: 97.8 F (36.6 C) 97.3 F (36.3 C)  97.8 F (36.6 C)  TempSrc: Oral Oral  Oral  Resp: 20 20  20   Height:      Weight:  154.3 kg (340 lb 2.7 oz)    SpO2: 96% 96% 96% 94%    Intake/Output Summary (Last 24 hours) at 03/07/13 1129 Last data filed at 03/07/13 0836  Gross per 24 hour  Intake    600 ml  Output   2600 ml  Net  -2000 ml   Filed Weights   03/06/13 1751 03/06/13 2315 03/07/13 0534  Weight: 156.31 kg (344 lb 9.6 oz) 155.9 kg (343 lb 11.2 oz) 154.3 kg (340 lb 2.7 oz)    Exam:   General:  Morbidly obese NAD  Cardiovascular: RRR +murmur 1-2+ LE edema  Respiratory: normal effort BS distant but clear bilaterally no  crackles no wheeze  Abdomen: morbidly obese soft +BS non-tender to palpation  Musculoskeletal: no clubbing no cyanosis   Data Reviewed: Basic Metabolic Panel:  Recent Labs Lab 03/06/13 1801 03/07/13 0448  NA 140 140  K 4.7 4.1  CL 101 101  CO2 27 30  GLUCOSE 98 96  BUN 41* 37*  CREATININE 1.36* 1.32*  CALCIUM 9.4 9.1   Liver Function Tests: No results found for this basename: AST, ALT, ALKPHOS, BILITOT, PROT, ALBUMIN,  in the last 168 hours No results found for this basename: LIPASE, AMYLASE,  in the last 168 hours No results found for this basename: AMMONIA,  in the last 168 hours CBC:  Recent Labs Lab 03/06/13 1801  WBC 9.7  NEUTROABS 7.2  HGB 12.9  HCT 39.7  MCV 95.4  PLT 172   Cardiac Enzymes:  Recent Labs Lab 03/06/13 1801  TROPONINI <0.30   BNP (last 3 results)  Recent Labs  03/06/13 1801  PROBNP 5412.0*   CBG: No results found for this basename: GLUCAP,  in the last 168 hours  No results found for this or any previous visit (from the past 240 hour(s)).   Studies: Dg Chest 2 View  03/06/2013   *RADIOLOGY REPORT*  Clinical Data: Shortness of breath for 3 weeks, history of CHF, former smoker  CHEST - 2 VIEW  Comparison: 05/07/2011; 01/02/2011  Findings:  The lateral radiograph is degraded secondary to exclusion of the bilateral costophrenic  angles.  Grossly unchanged enlarged cardiac silhouette and mediastinal contours.  Stable position of support apparatus.  There is chronic mild pulmonary congestion without frank evidence of edema.  Grossly unchanged perihilar heterogeneous opacities.  No new focal airspace opacities. There is chronic blunting of the right costophrenic angle.  No definite pleural effusion or pneumothorax.  Unchanged bones.  IMPRESSION: Cardiomegaly and chronic pulmonary venous congestion without acute cardiopulmonary disease.   Original Report Authenticated By: Jake Seats, MD    Scheduled Meds: . allopurinol  300 mg Oral Daily   . aspirin  325 mg Oral Daily  . colchicine  0.6 mg Oral Daily  . enoxaparin (LOVENOX) injection  40 mg Subcutaneous Q24H  . furosemide  60 mg Intravenous BID WC  . HYDROcodone-acetaminophen  1 tablet Oral TID  . lisinopril  10 mg Oral Daily  . pantoprazole  40 mg Oral Daily  . potassium chloride  40 mEq Oral TID PC  . sodium chloride  3 mL Intravenous Q12H   Continuous Infusions:   Principal Problem:   CHF exacerbation Active Problems:   OBSTRUCTIVE SLEEP APNEA   HYPERTENSION   ALLERGIC RHINITIS   PPM-St.Jude   Morbid obesity   Sinoatrial node dysfunction    Time spent: 30 minutes    [facility] Pager [phone]. If 7PM-7AM, please contact night-coverage at [email or web address], password [facility] 03/07/2013, 11:29 AM  LOS: 1 day

## 2013-03-07 NOTE — Progress Notes (Signed)
UR Chart Review Completed  

## 2013-03-07 NOTE — Progress Notes (Signed)
PT WANTED TO GET OOB BRIEFLY JUST TO STAND. TOLERATED FAIR W/ HER OWN WALKER.

## 2013-03-07 NOTE — Progress Notes (Signed)
*  PRELIMINARY RESULTS* Echocardiogram 2D Echocardiogram has been performed.  Tera Partridge 03/07/2013, 10:13 AM

## 2013-03-08 DIAGNOSIS — I5021 Acute systolic (congestive) heart failure: Secondary | ICD-10-CM | POA: Diagnosis not present

## 2013-03-08 DIAGNOSIS — I509 Heart failure, unspecified: Secondary | ICD-10-CM | POA: Diagnosis not present

## 2013-03-08 DIAGNOSIS — I4891 Unspecified atrial fibrillation: Secondary | ICD-10-CM | POA: Diagnosis not present

## 2013-03-08 LAB — BASIC METABOLIC PANEL
BUN: 35 mg/dL — ABNORMAL HIGH (ref 6–23)
CO2: 33 mEq/L — ABNORMAL HIGH (ref 19–32)
Chloride: 99 mEq/L (ref 96–112)
Creatinine, Ser: 1.45 mg/dL — ABNORMAL HIGH (ref 0.50–1.10)
Potassium: 4.9 mEq/L (ref 3.5–5.1)

## 2013-03-08 MED ORDER — POTASSIUM CHLORIDE CRYS ER 20 MEQ PO TBCR
20.0000 meq | EXTENDED_RELEASE_TABLET | Freq: Every day | ORAL | Status: DC
  Administered 2013-03-08 – 2013-03-10 (×3): 20 meq via ORAL
  Filled 2013-03-08 (×4): qty 1

## 2013-03-08 MED ORDER — ENOXAPARIN SODIUM 80 MG/0.8ML ~~LOC~~ SOLN
75.0000 mg | SUBCUTANEOUS | Status: DC
  Administered 2013-03-09 – 2013-03-10 (×2): 75 mg via SUBCUTANEOUS
  Filled 2013-03-08 (×2): qty 0.8

## 2013-03-08 NOTE — Progress Notes (Signed)
PHARMACIST - PHYSICIAN COMMUNICATION Patient was receiving Lovenox 40 mg SQ every 24 hours for DVT prophylaxis Patient weight 152.4 kg Patient BMI > 30 (52.2) Patient CrCl > 30 ml/min (56.6 ml/min)  Recommended DVT prophylaxis dose for patient with BMI > 30 and CrCl > 30 ml/min is 0.5 mg/kg/24 hours  Pharmacy changed Lovenox dose to 75 mg SQ every 24 hours for DVT prophylaxis  Please contact pharmacy (ex [phone]) if any questions/concerns  Thanks Maeola Sarah [facility]

## 2013-03-08 NOTE — Progress Notes (Signed)
INITIAL NUTRITION ASSESSMENT  DOCUMENTATION CODES Per approved criteria  -Morbid Obesity   INTERVENTION: Continue with nutrition POC Follow up for diet edu  NUTRITION DIAGNOSIS: Involuntary wt gain related to excessive sodium intake, CHF as evidenced by 7.4% wt gain x 4 days, per MD notes.   Goal: Pt will meet >/= 75% of estimated energy needs. Pt will be compliant with prescribed diet during hospitalization.  Monitor:  PO intake, weight changes, labs, skin integrity, changes in status  Reason for Assessment: MD consult to assess needs  67 y.o. female  Admitting Dx: Acute systolic congestive heart failure  ASSESSMENT: Pt admitted for CHF exacerbation, SOB, and increased weight. Wt hx reveals baseline wt of 325#; wt fluctuates between 325 and 359. MD noted 25# (7.4%) wt gain x 4 days, which is clinically significant. Noted 7# (2%) wt loss x 6 months, which is not clinically significant.  Pt currently on a heart healthy diet; fair to goof intake (PO: 50-75%). Attempted diet edu at time of visit, but pt was asleep, on CPAP at time of visit. No family or caregivers present. Pt unable to participate in interview.   Height: Ht Readings from Last 1 Encounters:  03/06/13 5' 6"  (1.676 m)    Weight: Wt Readings from Last 1 Encounters:  03/08/13 336 lb (152.409 kg)    Ideal Body Weight: 130#  % Ideal Body Weight: 258%  Wt Readings from Last 10 Encounters:  03/08/13 336 lb (152.409 kg)  03/03/13 325 lb (147.419 kg)  09/02/12 343 lb (155.584 kg)  08/31/11 325 lb (147.419 kg)  08/14/11 331 lb 3.2 oz (150.231 kg)  05/16/11 329 lb 6.4 oz (149.415 kg)  05/09/11 344 lb 3.2 oz (156.128 kg)  11/16/10 353 lb (160.12 kg)  08/22/10 357 lb (161.934 kg)  07/04/10 359 lb 6.4 oz (163.023 kg)    Usual Body Weight: 325#  % Usual Body Weight: 103%  BMI:  Body mass index is 54.26 kg/(m^2). Meets criteria for extreme obesity, class III.   Estimated Nutritional Needs: Kcal: 2000-2100  daily Protein: 122-152 grams daily Fluid: 2.0-2.1 L daily  Skin: Intact  Diet Order: Cardiac  EDUCATION NEEDS: -Education not appropriate at this time   Intake/Output Summary (Last 24 hours) at 03/08/13 1337 Last data filed at 03/08/13 0949  Gross per 24 hour  Intake   1513 ml  Output   3900 ml  Net  -2387 ml    Last BM: 03/05/13   Labs:   Recent Labs Lab 03/06/13 1801 03/07/13 0448 03/08/13 0526  NA 140 140 140  K 4.7 4.1 4.9  CL 101 101 99  CO2 27 30 33*  BUN 41* 37* 35*  CREATININE 1.36* 1.32* 1.45*  CALCIUM 9.4 9.1 9.2  GLUCOSE 98 96 112*    CBG (last 3)  No results found for this basename: GLUCAP,  in the last 72 hours  Scheduled Meds: . allopurinol  300 mg Oral Daily  . aspirin  325 mg Oral Daily  . colchicine  0.6 mg Oral Daily  . [START ON 03/09/2013] enoxaparin (LOVENOX) injection  75 mg Subcutaneous Q24H  . furosemide  60 mg Intravenous BID WC  . HYDROcodone-acetaminophen  1 tablet Oral TID  . lisinopril  10 mg Oral Daily  . pantoprazole  40 mg Oral Daily  . potassium chloride  20 mEq Oral Daily  . sodium chloride  3 mL Intravenous Q12H    Continuous Infusions:   Past Medical History  Diagnosis Date  . SYSTOLIC  chf,EF 30-35%   . Atrial fibrillation   . Hypertension   . Bursitis   . GERD (gastroesophageal reflux disease)   . Cancer   . Cataract   . Gallstones   . Kidney stones   . Sleep apnea   . CPAP/BiPAP dependent     at night   . Morbid obesity   . Chronic cellulitis   . Degenerative joint disease   . Gout   . Diverticulitis   . Blood transfusion   . Anemia   . Sinoatrial node dysfunction     Past Surgical History  Procedure Laterality Date  . Abdominal hysterectomy  1991  . Pacemaker insertion  Nov 2000  . Eye surgery    . Percutaneous nephrolithotomy  April 2012    Joaquim Lai, RD, LDN Pager: [phone]

## 2013-03-08 NOTE — Progress Notes (Signed)
[facility] PROGRESS NOTE  [name] [name] SPQ:[id number] DOB: 08-17-1946 DOA: 03/06/2013 PCP: [name], MD  Assessment/Plan: 1. Acute on chronic systolic congestive heart failure: Slowly improving, excellent diuresis. Continue Lasix current dosing. 2. Chronic kidney disease stage III: Overall appears stable. 3. Chronic atrial fibrillation: Remains in a ventricular paced rhythm. Patient refuses warfarin. Normal rate. 4. Obstructive sleep apnea: Stable. Continue CPAP. 5. History complete heart block, status post permanent pacemaker placement 6. Morbid obesity   Continue daily basic metabolic panel.  Continue Lasix at current dosing. Continue lisinopril. Patient refuses beta blocker.  Decrease potassium supplementation  Dietitian consultation  Physical therapy  Code Status: Full code DVT prophylaxis: Lovenox Family Communication: None present Disposition Plan: Return home when improved  Murray Hodgkins, MD  [facility]  Pager [phone] If 7PM-7AM, please contact night-coverage at [email or web address], password [facility] 03/08/2013, 8:38 AM  LOS: 2 days   Clinical Summary: 67 year old woman present with weight gain and increasing shortness of breath. Admitted for acute systolic congestive heart failure.  Consultants:  Cardiology  Procedures:  2-D echocardiogram: Left ventricle not well seen. Could not evaluate function. Left and right atriums severely dilated.  HPI/Subjective: Slept okay last night. Still somewhat short of breath. No nausea, vomiting or abdominal pain. No other complaints voiced.  Objective: Filed Vitals:   03/07/13 1400 03/07/13 2100 03/08/13 0235 03/08/13 0540  BP: 119/79 145/87 134/90 144/93  Pulse: 95 90 98 99  Temp: 97.8 F (36.6 C) 98 F (36.7 C) 97.9 F (36.6 C) 97.4 F (36.3 C)  TempSrc: Oral Oral Oral Oral  Resp: 20 20 18 20   Height:      Weight:    152.409 kg (336 lb)  SpO2: 96% 98% 97% 97%    Intake/Output Summary (Last 24  hours) at 03/08/13 0838 Last data filed at 03/08/13 0600  Gross per 24 hour  Intake   1510 ml  Output   5400 ml  Net  -3890 ml     Filed Weights   03/06/13 2315 03/07/13 0534 03/08/13 0540  Weight: 155.9 kg (343 lb 11.2 oz) 154.3 kg (340 lb 2.7 oz) 152.409 kg (336 lb)    Exam:  General: Appears calm and comfortable. Speech fluent and clear. No acute distress. Cardiovascular: Regular rate and rhythm. No murmur, rub, gallop. There is some wrinkling over the dorsum of both feet consistent with decreased swelling, however edema remains prominent, 2-3+. Telemetry: Paced rhythm. Respiratory clear to auscultation bilaterally. No wheezes, rales, rhonchi. Normal respiratory effort. Abdomen obese, soft, nontender, nondistended  Data Reviewed:  Excellent urine output 5.4 L. Weight down a total of 4 kg. Creatinine modestly elevated 1.45. TSH normal.  Pending studies:  None  Scheduled Meds: . allopurinol  300 mg Oral Daily  . aspirin  325 mg Oral Daily  . colchicine  0.6 mg Oral Daily  . enoxaparin (LOVENOX) injection  40 mg Subcutaneous Q24H  . furosemide  60 mg Intravenous BID WC  . HYDROcodone-acetaminophen  1 tablet Oral TID  . lisinopril  10 mg Oral Daily  . pantoprazole  40 mg Oral Daily  . potassium chloride  20 mEq Oral TID PC  . sodium chloride  3 mL Intravenous Q12H   Continuous Infusions:   Principal Problem:   Acute systolic congestive heart failure Active Problems:   OBSTRUCTIVE SLEEP APNEA   HYPERTENSION   Atrial fibrillation   ALLERGIC RHINITIS   PPM-St.Jude   CHF exacerbation   Morbid obesity   Sinoatrial node dysfunction   Chronic renal  failure   Time spent 15 minutes

## 2013-03-09 DIAGNOSIS — I5021 Acute systolic (congestive) heart failure: Secondary | ICD-10-CM | POA: Diagnosis not present

## 2013-03-09 DIAGNOSIS — I4891 Unspecified atrial fibrillation: Secondary | ICD-10-CM | POA: Diagnosis not present

## 2013-03-09 DIAGNOSIS — I509 Heart failure, unspecified: Secondary | ICD-10-CM | POA: Diagnosis not present

## 2013-03-09 LAB — BASIC METABOLIC PANEL
CO2: 33 mEq/L — ABNORMAL HIGH (ref 19–32)
Calcium: 9.3 mg/dL (ref 8.4–10.5)
Chloride: 97 mEq/L (ref 96–112)
Glucose, Bld: 100 mg/dL — ABNORMAL HIGH (ref 70–99)
Sodium: 138 mEq/L (ref 135–145)

## 2013-03-09 NOTE — Progress Notes (Signed)
[facility] PROGRESS NOTE  [name] [name] JYN:[id number] DOB: 10/19/1945 DOA: 03/06/2013 PCP: [name], MD  Assessment/Plan: 1. Acute on chronic systolic congestive heart failure: continues to improve symptomatically. Excellent diuresis. Creatinine preserve, continue Lasix current dosing. 2. Chronic kidney disease stage III: Overall appears stable. 3. Chronic atrial fibrillation: Remains ventricularly paced. Refuses warfarin. Normal rate. Refuses beta blocker. 4. Obstructive sleep apnea: Remained stable. Continue CPAP. 5. History complete heart block, status post permanent pacemaker placement 6. Morbid obesity   Continue Lasix. Repeat basic metabolic panel in the morning.  Physical therapy consultation  Possible discharge one-2 days.  Code Status: Full code DVT prophylaxis: Lovenox Family Communication: None present Disposition Plan: Return home when improved  Murray Hodgkins, MD  [facility]  Pager [phone] If 7PM-7AM, please contact night-coverage at [email or web address], password [facility] 03/09/2013, 8:43 AM  LOS: 3 days   Clinical Summary: 67 year old woman present with weight gain and increasing shortness of breath. Admitted for acute systolic congestive heart failure.  Consultants:  Cardiology  Procedures:  2-D echocardiogram: Left ventricle not well seen. Could not evaluate function. Left and right atriums severely dilated.  HPI/Subjective: Afebrile stable vital signs. No issues overnight. Breathing better. Less edema in feet. Eating well. No nausea, vomiting or abdominal pain. No lower extremity pain.  Objective: Filed Vitals:   03/08/13 2000 03/09/13 0000 03/09/13 0445 03/09/13 0545  BP: 106/69 142/88  114/69  Pulse: 103 101  106  Temp: 97.8 F (36.6 C) 98.2 F (36.8 C)  97.8 F (36.6 C)  TempSrc: Oral Oral  Oral  Resp: 20 20  18   Height:      Weight:   147.3 kg (324 lb 11.8 oz)   SpO2: 94% 93%  96%    Intake/Output Summary (Last 24 hours)  at 03/09/13 0843 Last data filed at 03/09/13 0500  Gross per 24 hour  Intake   1126 ml  Output   5650 ml  Net  -4524 ml     Filed Weights   03/07/13 0534 03/08/13 0540 03/09/13 0445  Weight: 154.3 kg (340 lb 2.7 oz) 152.409 kg (336 lb) 147.3 kg (324 lb 11.8 oz)    Exam:  General: Appears calm and comfortable. Speech is fluent and clear. Nontoxic. Cardiovascular: Regular rate and rhythm. The murmur, rub, gallop. Decreasing lower extremity edema with wrinkling of skin, now 1-2+ bilaterally left greater than right. Respiratory clear to auscultation bilaterally. No wheezes, rales, rhonchi. Normal respiratory effort. Abdomen soft, nontender, nondistended  Data Reviewed:  Excellent urine output 5.6 L. Weight down approximately 8 kg. BUN and creatinine remained stable. Potassium stable 4.5.  Pending studies:  None  Scheduled Meds: . allopurinol  300 mg Oral Daily  . aspirin  325 mg Oral Daily  . colchicine  0.6 mg Oral Daily  . enoxaparin (LOVENOX) injection  75 mg Subcutaneous Q24H  . furosemide  60 mg Intravenous BID WC  . HYDROcodone-acetaminophen  1 tablet Oral TID  . lisinopril  10 mg Oral Daily  . pantoprazole  40 mg Oral Daily  . potassium chloride  20 mEq Oral Daily  . sodium chloride  3 mL Intravenous Q12H   Continuous Infusions:   Principal Problem:   Acute systolic congestive heart failure Active Problems:   OBSTRUCTIVE SLEEP APNEA   HYPERTENSION   Atrial fibrillation   ALLERGIC RHINITIS   PPM-St.Jude   CHF exacerbation   Morbid obesity   Sinoatrial node dysfunction   Chronic renal failure   Time spent 15 minutes

## 2013-03-10 DIAGNOSIS — I509 Heart failure, unspecified: Secondary | ICD-10-CM | POA: Diagnosis not present

## 2013-03-10 DIAGNOSIS — I5021 Acute systolic (congestive) heart failure: Secondary | ICD-10-CM | POA: Diagnosis not present

## 2013-03-10 DIAGNOSIS — I4891 Unspecified atrial fibrillation: Secondary | ICD-10-CM | POA: Diagnosis not present

## 2013-03-10 LAB — BASIC METABOLIC PANEL
BUN: 30 mg/dL — ABNORMAL HIGH (ref 6–23)
CO2: 32 mEq/L (ref 19–32)
Chloride: 95 mEq/L — ABNORMAL LOW (ref 96–112)
GFR calc non Af Amer: 40 mL/min — ABNORMAL LOW (ref >90)
Glucose, Bld: 102 mg/dL — ABNORMAL HIGH (ref 70–99)
Potassium: 4.2 mEq/L (ref 3.5–5.1)
Sodium: 137 mEq/L (ref 135–145)

## 2013-03-10 MED ORDER — FUROSEMIDE 40 MG PO TABS: 40.0000 mg | ORAL_TABLET | Freq: Every day | ORAL | Status: DC

## 2013-03-10 MED ORDER — FUROSEMIDE 40 MG PO TABS: 40.0000 mg | ORAL_TABLET | Freq: Two times a day (BID) | ORAL | Status: DC

## 2013-03-10 NOTE — Progress Notes (Signed)
Patient and daughter discharged with instructions, prescriptions, and care notes.  Pt/daughter verbalized understanding.  The patient left the floor via w/c with staff and family in stable condition.

## 2013-03-10 NOTE — Discharge Summary (Signed)
Patient seen and examined. Agree with discharge.  Murray Hodgkins, MD [facility] [phone]

## 2013-03-10 NOTE — Discharge Summary (Signed)
Physician Discharge Summary  [name] [name] GXQ:[id number] DOB: [birth year] DOA: 03/06/2013  PCP: [name], MD  Admit date: 03/06/2013 Discharge date: 03/10/2013  Time spent: 40 minutes  Recommendations for Outpatient Follow-up:  1. Dr. Lovena Le with cardiology 1 week for evaluation of symptoms 2. PCP 1 week for evaluation of symptoms  Discharge Diagnoses:  Principal Problem:   Acute systolic congestive heart failure Active Problems:   OBSTRUCTIVE SLEEP APNEA   HYPERTENSION   Atrial fibrillation   ALLERGIC RHINITIS   PPM-St.Jude   CHF exacerbation   Morbid obesity   Sinoatrial node dysfunction   Chronic renal failure   Discharge Condition: stable  Diet recommendation: heart healthy  Filed Weights   03/07/13 0534 03/08/13 0540 03/09/13 0445  Weight: 154.3 kg (340 lb 2.7 oz) 152.409 kg (336 lb) 147.3 kg (324 lb 11.8 oz)    History of present illness:  [name] [name] is a 67 y.o. morbidly obese lady with a history of systolic heart failure, maintained on diuretics and fluid restriction, presented to the emergency room on 03/06/13 with worsening shortness of breath.  Her daughter who was with her reported that this happens about every 3 years. She reported compliance with medications and diet, but does not weigh every single day, and reports about a 25 pound weight gain over the previous 4 days associated with severe shortness of breath. She denied chest pain dizziness or diaphoresis; she denied wheezing. In the emergency room her beta-type natruretic peptide was noted to be markedly elevated and the hospitalist service was called to assist.  She usually takes 20 mg of Lasix once or twice per day but took 60 mg on day of admission because of the increased weight. The patient was particular to point out that she did not want her medications to be changed, because her cardiologist Dr. Lovena Le was very upset, recently when her medications  changed without his consulting, and she did  not want beta blockers because they nearly killed her the last time she had them.   Hospital Course:  Acute on chronic systolic HF: Echo in 1448 35%. Admitted and given IV lasix. Diuresed well. Seen by cardiology.  Refuses BB. Repeat echo yielded LV not seen well enough to evaluate LV. Wall thickness increased in pattern of mild to moderate LVH. Volume status -12.3L.Wt 147.3kg down from 156.3kg on admission.   Afib: remained ventricular paced. Refuses coumadin and beta blocker. S/p AVN ablation. Continue ASA  Chronic renal failure III. Stable  Active Problems:  OBSTRUCTIVE SLEEP APNEA : CPAP from home.   HYPERTENSION : fair control. Continue ACE Inhibitor   Gout: stable at baseline.   Morbid obesity: BMI 55.6. Nutritional consult   Sinoatrial node dysfunction: pt with pacemaker    Procedures:  none  Consultations:  cardiology  Discharge Exam: Filed Vitals:   03/09/13 2119 03/10/13 0200 03/10/13 0519 03/10/13 0930  BP: 131/76 110/55 119/83 131/72  Pulse: 96 97 104 103  Temp: 98.5 F (36.9 C) 98.3 F (36.8 C) 97.3 F (36.3 C) 98.1 F (36.7 C)  TempSrc: Oral Oral Oral Oral  Resp: 20 18 18 18   Height:      Weight:      SpO2: 97% 98% 93% 92%    General: morbidly obese Cardiovascular: RRR No MGR 1-2+LE edema which is improved Respiratory: normal effort BS clear bilaterally no wheeze no crackles Abdomen: obese soft +BS non-tender to palpation.   Discharge Instructions      Discharge Orders   Future Appointments Provider  Department Dept Phone   03/17/2013 9:25 AM [facility] [employer]) [phone]   Future Orders Complete By Expires     Call MD for:  difficulty breathing, headache or visual disturbances  As directed     Diet - low sodium heart healthy  As directed     Face-to-face encounter (required for Medicare/Medicaid patients)  As directed     Comments:      I [facility] certify that this patient is under my  care and that I, or a nurse practitioner or physician's assistant working with me, had a face-to-face encounter that meets the physician face-to-face encounter requirements with this patient on 03/10/2013. The encounter with the patient was in whole, or in part for the following medical condition(s) which is the primary reason for home health care (List medical condition): CHF    Questions:      The encounter with the patient was in whole, or in part, for the following medical condition, which is the primary reason for home health care:  CHF    I certify that, based on my findings, the following services are medically necessary home health services:  Nursing    Physical therapy    My clinical findings support the need for the above services:  Shortness of breath with activity    Further, I certify that my clinical findings support that this patient is homebound due to:  Shortness of Breath with activity    Reason for Medically Necessary Home Health Services:  Skilled Nursing- Skilled Assessment/Observation    Heart failure home health orders  As directed     Comments:      Heart Failure Follow-up Care:  Verify follow-up appointments per Patient Discharge Instructions. Confirm transportation arranged. Reconcile home medications with discharge medication list. Remove discontinued medications from use. Assist patient/caregiver to manage medications using pill box. Reinforce low sodium food selection Assessments: Vital signs and oxygen saturation at each visit. Assess home environment for safety concerns, caregiver support and availability of low-sodium foods. Consult Education officer, museum, PT/OT, Dietitian, and CNA based on assessments. Perform comprehensive cardiopulmonary assessment. Notify MD for any change in condition or weight gain of 3 pounds in one day or 5 pounds in one week with symptoms. Daily Weights and Symptom Monitoring: Ensure patient has access to scales. Teach patient/caregiver to weigh  daily before breakfast and after voiding using same scale and record.    Teach patient/caregiver to track weight and symptoms and when to notify Provider. Activity: Develop individualized activity plan with patient/caregiver.    Questions:      Skilled Nurse to notify MD of weight trends weekly for first 2 weeks. May fax or call:      Heart Failure Follow-up Care:  [facility] ([facility] at [place] Visits:      Obtain the following labs:  Basic Metabolic Panel    Lab frequency:  Weekly    Fax lab results to:  [facility] Clinic at [phone]    Diet:  Low Sodium Heart Healthy    Fluid restrictions:  1200 mL Fluid    Increase activity slowly  As directed         Medication List    TAKE these medications       allopurinol 300 MG tablet  Commonly known as:  ZYLOPRIM  Take 300 mg by mouth daily.     aspirin 325 MG tablet  Take 325 mg by mouth daily.  calcium gluconate 500 MG tablet  Take 500 mg by mouth daily.     COD LIVER OIL PO  Take 1 tablet by mouth daily.     colchicine 0.6 MG tablet  Take 0.6 mg by mouth daily.     colchicine-probenecid 0.5-500 MG per tablet  Take 1 tablet by mouth 2 (two) times daily as needed. Gout flare up     furosemide 40 MG tablet  Commonly known as:  LASIX  Take 1 tablet (40 mg total) by mouth 2 (two) times daily.     HYDROcodone-acetaminophen 10-325 MG per tablet  Commonly known as:  NORCO  Take 1 tablet by mouth 3 (three) times daily.     lisinopril 10 MG tablet  Commonly known as:  PRINIVIL,ZESTRIL  Take 10 mg by mouth daily.     multivitamin tablet  Take 1 tablet by mouth daily.     NON FORMULARY  bpap  As directed     omeprazole 20 MG capsule  Commonly known as:  PRILOSEC  Take 20 mg by mouth daily.     potassium chloride SA 20 MEQ tablet  Commonly known as:  K-DUR,KLOR-CON  Take 20 mEq by mouth 3 (three) times daily.       Allergies  Allergen Reactions  . Beta Adrenergic Blockers      REACTION: bradycardia  . Ciprofloxacin     REACTION: hives  . Codeine     REACTION: throat swelling  . Latex     REACTION: rash, itching  . Penicillins     REACTION: hives  . Peppermint Oil   . Prednisone     REACTION: swelling, S.O.B.  . Tape Dermatitis    States all blister up her skin   Follow-up Information   Follow up with [name], MD. Schedule an appointment as soon as possible for a visit in 1 week. (evaluation of symptoms)    Contact information:   [place] [address] [phone]       Follow up with Cristopher Peru, MD. Schedule an appointment as soon as possible for a visit in 1 week. (evaluation of symptoms)    Contact information:   [address] [place] [address] [phone]        The results of significant diagnostics from this hospitalization (including imaging, microbiology, ancillary and laboratory) are listed below for reference.    Significant Diagnostic Studies: Dg Chest 2 View  03/06/2013   *RADIOLOGY REPORT*  Clinical Data: Shortness of breath for 3 weeks, history of CHF, former smoker  CHEST - 2 VIEW  Comparison: 05/07/2011; 01/02/2011  Findings:  The lateral radiograph is degraded secondary to exclusion of the bilateral costophrenic angles.  Grossly unchanged enlarged cardiac silhouette and mediastinal contours.  Stable position of support apparatus.  There is chronic mild pulmonary congestion without frank evidence of edema.  Grossly unchanged perihilar heterogeneous opacities.  No new focal airspace opacities. There is chronic blunting of the right costophrenic angle.  No definite pleural effusion or pneumothorax.  Unchanged bones.  IMPRESSION: Cardiomegaly and chronic pulmonary venous congestion without acute cardiopulmonary disease.   Original Report Authenticated By: Jake Seats, MD    Microbiology: No results found for this or any previous visit (from the past 240 hour(s)).   Labs: Basic  Metabolic Panel:  Recent Labs Lab 03/06/13 1801 03/07/13 0448 03/08/13 0526 03/09/13 0529 03/10/13 0502  NA 140 140 140 138 137  K 4.7 4.1 4.9 4.5 4.2  CL 101 101 99 97 95*  CO2 27 30 33* 33* 32  GLUCOSE 98 96 112* 100* 102*  BUN 41* 37* 35* 31* 30*  CREATININE 1.36* 1.32* 1.45* 1.42* 1.32*  CALCIUM 9.4 9.1 9.2 9.3 9.4   Liver Function Tests: No results found for this basename: AST, ALT, ALKPHOS, BILITOT, PROT, ALBUMIN,  in the last 168 hours No results found for this basename: LIPASE, AMYLASE,  in the last 168 hours No results found for this basename: AMMONIA,  in the last 168 hours CBC:  Recent Labs Lab 03/06/13 1801  WBC 9.7  NEUTROABS 7.2  HGB 12.9  HCT 39.7  MCV 95.4  PLT 172   Cardiac Enzymes:  Recent Labs Lab 03/06/13 1801  TROPONINI <0.30   BNP: BNP (last 3 results)  Recent Labs  03/06/13 1801  PROBNP 5412.0*   CBG: No results found for this basename: GLUCAP,  in the last 168 hours     Signed:  Radene Gunning  [facility] 03/10/2013, 3:20 PM

## 2013-03-10 NOTE — Progress Notes (Addendum)
[facility]  OLIVIAROSE PUNCH GAY:[id number] DOB: 12-20-1945 DOA: 03/06/2013 PCP: [name], MD  Interval history/Subjective: Did well overnight. No new issues. Breathing better. Edema is decreasing and legs. Eating well. No nausea or vomiting.  Objective: Afebrile, vitals stable. No hypoxia. Weight has decreased approximately 8 kg since admission. Excellent urine output continues. Appears calm and comfortable. Respiratory clear to auscultation bilaterally. No wheezes, rales, rhonchi. Normal respiratory effort. Cardio vascular regular rate and rhythm. Traumatic decrease in bilateral lower extremity edema with marked wrinkling of the skin.  Labs/studies: Creatinine stable 1.32. Potassium normal 4.2.  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 Remains ventricular paced. Patient refuses warfarin and beta blocker.  Obstructive sleep apnea, stable, continue CPAP  History of complete heart block, status post permanent pacemaker placement  Morbid obesity  Plan:  Likely change Lasix to oral later today. Followup with cardiology team. Would anticipate discharge either today or 6/17.  Could discharge her on Lasix 40 mg twice a day, discuss with heart team   Murray Hodgkins, MD [facility] [phone]

## 2013-03-10 NOTE — Progress Notes (Signed)
PT Cancellation Note  Patient Details Name: [name] [name] MRN: [id number] DOB: [birth year]   Cancelled Treatment:    Reason Eval/Treat Not Completed: Other (comment) Pt states that she is feeling fine and doesn't need a PT eval.  She is anxious to get home.  We will d/c PT orders.  Demetrios Isaacs L 03/10/2013, 1:14 PM

## 2013-03-11 ENCOUNTER — Telehealth: Payer: Self-pay | Admitting: Pulmonary Disease

## 2013-03-11 DIAGNOSIS — G4733 Obstructive sleep apnea (adult) (pediatric): Secondary | ICD-10-CM

## 2013-03-11 NOTE — Telephone Encounter (Signed)
Let pt know - that [employer] requesting bipap titration study [facility] - pl ask [employer] if a simple letter stating that 'she did not tolerate CPPA & has used bipap with success ' will suffice OK to schedule if willing

## 2013-03-12 ENCOUNTER — Telehealth: Payer: Self-pay | Admitting: *Deleted

## 2013-03-12 NOTE — Telephone Encounter (Signed)
Spoke to pt to advise results/instructions. Pt repeatedly refused to call her PCP if she experienced weight gain per stated "my primary care physician only sees me once a year and he has nothing to do with my heart" I advised that DR PN reviewed her chart and noted no CAD, thus the possible weight gain does not seem to be related to her heart and that if the extra 13m lasix she takes today does not reduce her weight gain she can call her PCP to address this per the issue per the PCP can address weight gain, pt then wanted to know what else could cause the weight gain, advised "possbility" of kidney functions, pt then stated "well my kidneys were just checked, they are fine" I then reiterated the possibility only for kidney issues, there are multiple reasons she can have the weight gain and that her PCP can evaluate her to find the dx for weight gain, if the extra lasix tablet today does not assist her, pt understood and will contact our office If your signs and symptoms worsen please seek medical attention in your local Emergency Room, do not attempt to drive yourself per further endangerment, call 911 for transport or have someone drive you to the ER as soon as possible.pt understood

## 2013-03-12 NOTE — Telephone Encounter (Signed)
She has no CAD by cath would not worry about chest pain  Can take extra lasix if she is gaining weight. F/U with primary before [place] if needed

## 2013-03-12 NOTE — Telephone Encounter (Signed)
Called to speak with [name] [name] at [place]. She had already left for the day, but [place] at [place] looked at [employer] note that a letter would not be sufficient. Pt would need a CPAP/Bipap Titration Study. [name]

## 2013-03-12 NOTE — Telephone Encounter (Signed)
Pt was told when discharged from hospital to call if she has a weight gain of 2 or more lbs. She is up 5 lbs this morning.

## 2013-03-12 NOTE — Telephone Encounter (Signed)
Pt denies SOB/chest/arm/jaw pain, pt does advised she has noted chest discomfort 10-12 times a week for 3-5 minutes between her shoulder blades, pt also denies dizzyness, however notes headache and feet swelling,BP 119/76 HR95 O2 98% via room air, pt notes pacemaker set on 90, If your signs and symptoms worsen please seek medical attention in your local Emergency Room, do not attempt to drive yourself per further endangerment, call 911 for transport or have someone drive you to the ER as soon as possible.please advise next steps in absence of GT and SM

## 2013-03-13 NOTE — Telephone Encounter (Signed)
Ok to schedule if willing

## 2013-03-13 NOTE — Telephone Encounter (Signed)
Pt aware and is willing to schedule. Order sent.

## 2013-03-14 ENCOUNTER — Encounter (HOSPITAL_BASED_OUTPATIENT_CLINIC_OR_DEPARTMENT_OTHER): Payer: Medicare Other

## 2013-03-17 ENCOUNTER — Ambulatory Visit (INDEPENDENT_AMBULATORY_CARE_PROVIDER_SITE_OTHER): Payer: Medicare Other | Admitting: Adult Health

## 2013-03-17 ENCOUNTER — Encounter: Payer: Self-pay | Admitting: Internal Medicine

## 2013-03-17 ENCOUNTER — Ambulatory Visit (INDEPENDENT_AMBULATORY_CARE_PROVIDER_SITE_OTHER): Payer: Medicare Other | Admitting: *Deleted

## 2013-03-17 ENCOUNTER — Encounter: Payer: Self-pay | Admitting: Adult Health

## 2013-03-17 VITALS — BP 120/76 | HR 97 | Ht 66.0 in | Wt 313.1 lb

## 2013-03-17 DIAGNOSIS — I10 Essential (primary) hypertension: Secondary | ICD-10-CM

## 2013-03-17 DIAGNOSIS — Z950 Presence of cardiac pacemaker: Secondary | ICD-10-CM

## 2013-03-17 DIAGNOSIS — D649 Anemia, unspecified: Secondary | ICD-10-CM | POA: Diagnosis not present

## 2013-03-17 DIAGNOSIS — I4891 Unspecified atrial fibrillation: Secondary | ICD-10-CM

## 2013-03-17 DIAGNOSIS — I5021 Acute systolic (congestive) heart failure: Secondary | ICD-10-CM

## 2013-03-17 DIAGNOSIS — I509 Heart failure, unspecified: Secondary | ICD-10-CM | POA: Diagnosis not present

## 2013-03-17 DIAGNOSIS — I5023 Acute on chronic systolic (congestive) heart failure: Secondary | ICD-10-CM | POA: Diagnosis not present

## 2013-03-17 DIAGNOSIS — M109 Gout, unspecified: Secondary | ICD-10-CM | POA: Diagnosis not present

## 2013-03-17 DIAGNOSIS — E876 Hypokalemia: Secondary | ICD-10-CM | POA: Diagnosis not present

## 2013-03-17 LAB — REMOTE PACEMAKER DEVICE
BMOD-0002RV: 13
DEVICE MODEL PM: 7171350
RV LEAD IMPEDENCE PM: 630 Ohm
RV LEAD THRESHOLD: 0.625 V
VENTRICULAR PACING PM: 100

## 2013-03-17 MED ORDER — FUROSEMIDE 40 MG PO TABS: 40.0000 mg | ORAL_TABLET | Freq: Two times a day (BID) | ORAL | Status: DC

## 2013-03-17 NOTE — Progress Notes (Signed)
HPI: [name] [name] is a 67 year old patient of Dr. Lovena Le where following for ongoing assessment and management of atrial fibrillation, systolic CHF with an EF of 30-35%, hypertension, with history of morbid obesity and multiple medical issues. The patient refuses anticoagulation. 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 She was recently admitted to the hospital on 03/06/2013 in the setting of systolic CHF. She was diuresed 12.3 L.. Weight on discharge 324 pounds. She refuses beta blockers.   On discharge sodium 137 potassium 4.2 chloride 95 CO2 32 BUN 30 creatinine 1.32 (which is baseline for her) She was told to take extra lasix 40 mg on 03/12/2013 due to wt gain. She has been taking lasix 40 mg TID since that time. She has lost 12 lbs.  She has a PCP, Dr. Dorthy Cooler in [facility] practice. Labs have been drawn by him today.    She is feeling dehydrated and therefore drank some extra liquid today. Feels better now that she has had some fluid. She has overall aches and pains. Uses a scooter for ambulation due to deconditioning.  Allergies  Allergen Reactions  . Beta Adrenergic Blockers     REACTION: bradycardia  . Ciprofloxacin     REACTION: hives  . Codeine     REACTION: throat swelling  . Latex     REACTION: rash, itching  . Penicillins     REACTION: hives  . Peppermint Oil   . Prednisone     REACTION: swelling, S.O.B.  . Tape Dermatitis    States all blister up her skin    Current Outpatient Prescriptions  Medication Sig Dispense Refill  . allopurinol (ZYLOPRIM) 300 MG tablet Take 300 mg by mouth daily.        Marland Kitchen aspirin 325 MG tablet Take 325 mg by mouth daily.        . calcium gluconate 500 MG tablet Take 500 mg by mouth daily.      . COD LIVER OIL PO Take 1 tablet by mouth daily.        . colchicine 0.6 MG tablet Take 0.6 mg by mouth daily.       . colchicine-probenecid 0.5-500 MG per tablet Take 1 tablet by mouth 2 (two) times daily as  needed. Gout flare up      . furosemide (LASIX) 40 MG tablet Take 40 mg by mouth 3 (three) times daily.      Marland Kitchen HYDROcodone-acetaminophen (NORCO) 10-325 MG per tablet Take 1 tablet by mouth 3 (three) times daily.        Marland Kitchen lisinopril (PRINIVIL,ZESTRIL) 10 MG tablet Take 10 mg by mouth daily.        . Multiple Vitamin (MULTIVITAMIN) tablet Take 1 tablet by mouth daily.      . NON FORMULARY bpap As directed      . omeprazole (PRILOSEC) 20 MG capsule Take 20 mg by mouth daily.        . potassium chloride SA (K-DUR,KLOR-CON) 20 MEQ tablet Take 20 mEq by mouth 3 (three) times daily.         No current facility-administered medications for this visit.    Past Medical History  Diagnosis Date  . SYSTOLIC chf,EF 55-37%   . Atrial fibrillation   . Hypertension   . Bursitis   . GERD (gastroesophageal reflux disease)   . Cancer   . Cataract   . Gallstones   . Kidney stones   . Sleep apnea   .  CPAP/BiPAP dependent     at night   . Morbid obesity   . Chronic cellulitis   . Degenerative joint disease   . Gout   . Diverticulitis   . Blood transfusion   . Anemia   . Sinoatrial node dysfunction     Past Surgical History  Procedure Laterality Date  . Abdominal hysterectomy  1991  . Pacemaker insertion  Nov 2000  . Eye surgery    . Percutaneous nephrolithotomy  April 2012    WLK:HVFMBB of systems complete and found to be negative unless listed above  PHYSICAL EXAM BP 120/76  Pulse 97  Ht 5' 6"  (1.676 m)  Wt 313 lb 1.3 oz (142.012 kg)  BMI 50.56 kg/m2 General: Well developed, well nourished, in no acute distress Head: Eyes PERRLA, No xanthomas.   Normal cephalic and atramatic  Lungs: Clear bilaterally to auscultation and percussion. Heart: HRRR S1 S2, without MRG.  Pulses are 2+ & equal.            No carotid bruit. No JVD.  No abdominal bruits. No femoral bruits. Abdomen: Bowel sounds are positive, abdomen soft and non-tender without masses or                  Hernia's  noted. Msk:  Back normal, scooter for ambulation. Diminished overall strength and tone for age. Extremities: No clubbing, cyanosis or edema, chronic venous stasis skin discoloration.  DP +1 Neuro: Alert and oriented X 3. Psych:  Good affect, responds appropriately  UYZ:JQDUK sensed rhythm rate of 97 bpm. Rate of 97 bpm  ASSESSMENT AND PLAN

## 2013-03-17 NOTE — Assessment & Plan Note (Signed)
Currently well controlled. She is lower than BP while in hospital; leads me to believe that there is some mild dehydration with increased dose of lasix over the last couple of weeks. Will decrease lasix back to  40 mg BID. Will request labs from PCP that were drawn today.

## 2013-03-17 NOTE — Assessment & Plan Note (Signed)
Heart rate is elevated. She tells me that Dr.Taylor has her rate at 90 bpm. She feels better on this rate. She refuses anticoagulation, and is aware of the risks associated with stroke. She refuses BB as well.

## 2013-03-17 NOTE — Assessment & Plan Note (Signed)
No evidence of decompensation at this time. Continue lower dose of lasix with daily wts and salt restriction.

## 2013-03-17 NOTE — Patient Instructions (Addendum)
Your physician recommends that you schedule a follow-up appointment in: [place] has recommended you make the following change in your medication: Lasix 85m (1 tab) twice daily

## 2013-03-17 NOTE — Assessment & Plan Note (Signed)
Weight is down 12 lbs with increased dose of  Lasix to 40 mg TID. She will be decreased on dose of lasix to 40 mg BID. Await labs for evaluation of kidney function.

## 2013-03-20 DIAGNOSIS — M545 Low back pain: Secondary | ICD-10-CM | POA: Diagnosis not present

## 2013-03-20 DIAGNOSIS — R82998 Other abnormal findings in urine: Secondary | ICD-10-CM | POA: Diagnosis not present

## 2013-04-04 ENCOUNTER — Encounter: Payer: Self-pay | Admitting: *Deleted

## 2013-04-07 ENCOUNTER — Ambulatory Visit (HOSPITAL_BASED_OUTPATIENT_CLINIC_OR_DEPARTMENT_OTHER): Payer: Medicare Other | Attending: Pulmonary Disease | Admitting: Radiology

## 2013-04-07 VITALS — Ht 66.0 in | Wt 310.0 lb

## 2013-04-07 DIAGNOSIS — Z9989 Dependence on other enabling machines and devices: Secondary | ICD-10-CM

## 2013-04-07 DIAGNOSIS — G4733 Obstructive sleep apnea (adult) (pediatric): Secondary | ICD-10-CM | POA: Diagnosis not present

## 2013-04-08 DIAGNOSIS — M109 Gout, unspecified: Secondary | ICD-10-CM | POA: Diagnosis not present

## 2013-04-08 DIAGNOSIS — E876 Hypokalemia: Secondary | ICD-10-CM | POA: Diagnosis not present

## 2013-04-08 DIAGNOSIS — D6489 Other specified anemias: Secondary | ICD-10-CM | POA: Diagnosis not present

## 2013-04-08 DIAGNOSIS — I5023 Acute on chronic systolic (congestive) heart failure: Secondary | ICD-10-CM | POA: Diagnosis not present

## 2013-04-08 DIAGNOSIS — I10 Essential (primary) hypertension: Secondary | ICD-10-CM | POA: Diagnosis not present

## 2013-04-22 DIAGNOSIS — G4733 Obstructive sleep apnea (adult) (pediatric): Secondary | ICD-10-CM | POA: Diagnosis not present

## 2013-04-23 ENCOUNTER — Telehealth: Payer: Self-pay | Admitting: Pulmonary Disease

## 2013-04-23 NOTE — Telephone Encounter (Signed)
Pl send her sleep study to [place]

## 2013-04-23 NOTE — Procedures (Signed)
NAME:  [name] [name], [name] [name] NO.:  [id number]  MEDICAL RECORD NO.:  [id number]          PATIENT TYPE:  OUT  LOCATION:  SLEEP CENTER                 FACILITY:  [facility]  PHYSICIAN:  Rigoberto Noel, MD      DATE OF BIRTH:  [birth year]  DATE OF STUDY:  04/07/2013                           NOCTURNAL POLYSOMNOGRAM  REFERRING PHYSICIAN:  Rigoberto Noel, MD  INDICATION FOR STUDY:  Ms. Treichler is a 66 year old morbidly obese woman with obstructive sleep apnea.  Baseline polysomnogram in September 2011, showed AHI of 30 events per hour.  She has been maintained BiPAP since she was unable to titrate CPAP with an average pressure ranging from 18/14 to 17/15 based on auto BiPAP downloads.  This nocturnal polysomnogram was performed.  This CPAP titration study was performed with a sleep technologist in attendance.  EEG, EOG, EMG, EKG, and respiratory parameters were recorded.  Sleep stages, arousals, limb movements, and respiratory data were scored according to criteria laid out by the American Academy of Sleep Medicine.    SLEEP ARCHITECTURE:  Lights out was at 10:28 p.m.  Lights on was at 5 a.m.  Total sleep time was 3:22 minutes with a sleep period time of 377 minutes and a sleep efficiency of 82%.  Sleep latency was 11 minutes. Latency to REM sleep was 91 minutes and wake after sleep onset was 57 minutes.  Sleep stages of the percentage of total sleep time was N1 9%, N2 72%, N3 0.6%, and REM sleep 18% (58 minutes).  REM sleep was noted in two long stages with the longest around 3 a.m.  Supine sleep was noted 400%.  RESPIRATORY DATA:  CPAP was initiated at 5 cm and titrated to a final level of 15 cm with good results; however, due to audible snoring and few respiratory events and for the patient's comfort, BiPAP was initiated at 17/13 and titrated to a final level of 19/13 with a small fullface mask.  At this final level of 19/13 for 20 minutes of non-REM sleep, no events or  desaturations were noted.  This appears to be the optimal level used during the study.  At CPAP level of 13 cm for 41 minutes, one hypopnea was noted with desaturation of 91%.  AROUSAL DATA:  There were 262 arousals with an arousal index of 49 events per hour.  Most of these were spontaneous and few were associated with respiratory events.  OXYGEN DATA:  Desaturation index was 6 events per hour with the lowest desaturation of 88%.  CARDIAC DATA:  The low heart rate was 35 beats per minute and the high heart rate recorded was an artifact.  The baseline rhythm showed conduction delay.  No arrhythmias were noted.  MOVEMENT-PARASOMNIA:  Very few limb movements were noted with a PLM index of 2.2 events per hour.  The PLM arousal index was only 0.9 events per hour.  DISCUSSION:  She was desensitized with a small fullface mask and seemed to tolerate the trial quite well with one respiratory event.  IMPRESSIONS: 1. Moderate-to-severe obstructive sleep apnea with hypopneas causing     sleep fragmentation and oxygen desaturation. 2. This was corrected by BiPAP of  19/13 with a small fullface mask. 3. No evidence of cardiac arrhythmias, limb movements, or behavioral     disturbance during sleep.  RECOMMEND:  BiPAP should be initiated at 19/13 with a small fullface mask.  Compliance monitored at this level.  She should be asked to avoid medications and sedative side effects.  She should be cautioned against driving when sleepy.  Weight loss can be encouraged.     Rigoberto Noel, MD    RVA/MEDQ  D:  04/22/2013 08:16:25  T:  04/23/2013 00:14:19  Job:  [id number]

## 2013-04-24 ENCOUNTER — Ambulatory Visit: Payer: Medicare Other | Admitting: Internal Medicine

## 2013-04-24 NOTE — Telephone Encounter (Signed)
This has been sent over

## 2013-04-25 ENCOUNTER — Telehealth: Payer: Self-pay | Admitting: Pulmonary Disease

## 2013-04-25 NOTE — Telephone Encounter (Signed)
I spoke with pt and made her aware we faxed her sleep study to [facility] yesterday. We also sent them an order in June. Per pt she stated they are telling her they have nothing from [place].  I called [facility] and spoke with [facility]. She transferred me to carol. She is aware we have faxed study over and will get this processed. Nothing further was needed

## 2013-05-14 ENCOUNTER — Telehealth: Payer: Self-pay | Admitting: Pulmonary Disease

## 2013-05-14 NOTE — Telephone Encounter (Signed)
I spoke with pt. She stated [facility] will not send CPAP supplies bc pt face to face is not signed by RA.  I called [facility]. I was transferred to [facility]. I was on hold x 6 minutes [facility] [phone]

## 2013-05-15 NOTE — Telephone Encounter (Signed)
I called [facility]. They advised they did not received the OV note 03/03/13. Asked for this to be faxed to [phone]. I have done so. Nothing further needed I spoke with pt and made her aware. Nothing further needed

## 2013-05-27 DIAGNOSIS — M25569 Pain in unspecified knee: Secondary | ICD-10-CM | POA: Diagnosis not present

## 2013-05-27 DIAGNOSIS — M25469 Effusion, unspecified knee: Secondary | ICD-10-CM | POA: Diagnosis not present

## 2013-05-27 DIAGNOSIS — M25549 Pain in joints of unspecified hand: Secondary | ICD-10-CM | POA: Diagnosis not present

## 2013-05-30 ENCOUNTER — Encounter: Payer: Self-pay | Admitting: Internal Medicine

## 2013-05-30 ENCOUNTER — Ambulatory Visit (INDEPENDENT_AMBULATORY_CARE_PROVIDER_SITE_OTHER): Payer: Medicare Other | Admitting: Internal Medicine

## 2013-05-30 VITALS — BP 132/91 | HR 92 | Ht 66.0 in | Wt 321.8 lb

## 2013-05-30 DIAGNOSIS — I10 Essential (primary) hypertension: Secondary | ICD-10-CM | POA: Diagnosis not present

## 2013-05-30 DIAGNOSIS — R0602 Shortness of breath: Secondary | ICD-10-CM

## 2013-05-30 LAB — BASIC METABOLIC PANEL
BUN: 35 mg/dL — ABNORMAL HIGH (ref 6–23)
Chloride: 102 mEq/L (ref 96–112)
Potassium: 4.8 mEq/L (ref 3.5–5.3)
Sodium: 138 mEq/L (ref 135–145)

## 2013-05-30 MED ORDER — FUROSEMIDE 40 MG PO TABS: 40.0000 mg | ORAL_TABLET | Freq: Two times a day (BID) | ORAL | Status: DC

## 2013-05-30 NOTE — Patient Instructions (Addendum)
Your physician recommends that you schedule a follow-up appointment in: [place] physician recommends that you return for lab work in: [place] ([place] BMET)

## 2013-05-30 NOTE — Progress Notes (Signed)
HPI HPI: [name] [name] is a 67 year old patient of Dr. Lovena Le where following for ongoing assessment and management of atrial fibrillation, systolic CHF with an EF of 30-35%, hypertension, with history of morbid obesity and multiple medical issues. The patient refuses anticoagulation. 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 She was recently admitted to the hospital on 03/06/2013 in the setting of systolic CHF. She was diuresed 12.3 L.. Weight on discharge 324 pounds. She refuses beta blockers.  On discharge sodium 137 potassium 4.2 chloride 95 CO2 32 BUN 30 creatinine 1.32 (which is baseline for her) She was told to take extra lasix 40 mg on 03/12/2013 due to wt gain. She has been taking lasix 40 mg TID since that time. She has lost 12 lbs. She has a PCP, Dr. Dorthy Cooler in [facility] practice. Labs have been drawn by him today.  She is feeling dehydrated and therefore drank some extra liquid today. Feels better now that she has had some fluid. She has overall aches and pains. Uses a scooter for ambulation due to deconditioning.  Patient says she has been doing good until last week  Diet was way off  At daughters and after steroid injection ate a whole pizza  Took extra lasix to compensate No change in breathing  No SOB     Allergies  Allergen Reactions  . Beta Adrenergic Blockers     REACTION: bradycardia  . Ciprofloxacin     REACTION: hives  . Codeine     REACTION: throat swelling  . Latex     REACTION: rash, itching  . Penicillins     REACTION: hives  . Peppermint Oil   . Prednisone     REACTION: swelling, S.O.B.  . Tape Dermatitis    States all blister up her skin    Current Outpatient Prescriptions  Medication Sig Dispense Refill  . allopurinol (ZYLOPRIM) 300 MG tablet Take 300 mg by mouth daily.        Marland Kitchen aspirin 325 MG tablet Take 325 mg by mouth daily.        . calcium gluconate 500 MG tablet Take 500 mg by mouth daily.      . COD LIVER  OIL PO Take 1 tablet by mouth daily.        . colchicine 0.6 MG tablet Take 0.6 mg by mouth daily.       . colchicine-probenecid 0.5-500 MG per tablet Take 1 tablet by mouth 2 (two) times daily as needed. Gout flare up      . furosemide (LASIX) 40 MG tablet Take 1 tablet (40 mg total) by mouth 2 (two) times daily.  65 tablet  5  . HYDROcodone-acetaminophen (NORCO) 10-325 MG per tablet Take 1 tablet by mouth 3 (three) times daily.        Marland Kitchen lisinopril (PRINIVIL,ZESTRIL) 10 MG tablet Take 10 mg by mouth daily.        . Multiple Vitamin (MULTIVITAMIN) tablet Take 1 tablet by mouth daily.      . NON FORMULARY bpap As directed      . omeprazole (PRILOSEC) 20 MG capsule Take 20 mg by mouth daily.        . potassium chloride SA (K-DUR,KLOR-CON) 20 MEQ tablet Take 20 mEq by mouth 3 (three) times daily.         No current facility-administered medications for this visit.    Past Medical History  Diagnosis Date  . SYSTOLIC chf,EF 81-01%   .  Atrial fibrillation   . Hypertension   . Bursitis   . GERD (gastroesophageal reflux disease)   . Cancer   . Cataract   . Gallstones   . Kidney stones   . Sleep apnea   . CPAP/BiPAP dependent     at night   . Morbid obesity   . Chronic cellulitis   . Degenerative joint disease   . Gout   . Diverticulitis   . Blood transfusion   . Anemia   . Sinoatrial node dysfunction     Past Surgical History  Procedure Laterality Date  . Abdominal hysterectomy  1991  . Pacemaker insertion  Nov 2000  . Eye surgery    . Percutaneous nephrolithotomy  April 2012    No family history on file.  History   Social History  . Marital Status: Widowed    Spouse Name: N/A    Number of Children: N/A  . Years of Education: N/A   Occupational History  . Not on file.   Social History Main Topics  . Smoking status: Former Smoker -- 1.00 packs/day    Types: Cigarettes    Quit date: 09/25/1974  . Smokeless tobacco: Never Used  . Alcohol Use: No  . Drug Use: No   . Sexual Activity: No   Other Topics Concern  . Not on file   Social History Narrative  . No narrative on file    Review of Systems:  All systems reviewed.  They are negative to the above problem except as previously stated.  Vital Signs: BP 132/91  Pulse 92  Ht 5' 6"  (1.676 m)  Wt 321 lb 12 oz (145.945 kg)  BMI 51.96 kg/m2  Physical Exam Patient is a morbidly obese 67 yo examined in scooter HEENT:  Normocephalic, atraumatic. EOMI, PERRLA.  Neck: JVP is difficult to assess  No bruits.  Lungs: clear to auscultation. No rales no wheezes.  Heart: Regular rate and rhythm. Normal S1, S2. No S3.   No significant murmurs. PMI not displaced.  Abdomen:  Supple, nontender. Normal bowel sounds. No masses. No hepatomegaly.  Extremities:   Good distal pulses throughout. Tr lower extremity edema.  Musculoskeletal :moving all extremities.  Neuro:   alert and oriented x3.  CN II-XII grossly intact.   Assessment and Plan:  1.  Chronic systolic CHF  Volume status looks pretty good  Took extra lasix to take care of increased salt intake last week  Will check BMET   Refuses b blockers  Keep on same meds.   2.  HTN  Follow for now  I am reluctant to push further  3.  Afib 4.  S/p PPM

## 2013-06-03 NOTE — Addendum Note (Signed)
Addended by: Shara Blazing A on: 06/03/2013 03:59 PM   Modules accepted: Orders

## 2013-06-16 DIAGNOSIS — I10 Essential (primary) hypertension: Secondary | ICD-10-CM | POA: Diagnosis not present

## 2013-06-16 DIAGNOSIS — R0602 Shortness of breath: Secondary | ICD-10-CM | POA: Diagnosis not present

## 2013-06-17 LAB — BASIC METABOLIC PANEL
CO2: 30 mEq/L (ref 19–32)
Calcium: 9.2 mg/dL (ref 8.4–10.5)
Glucose, Bld: 129 mg/dL — ABNORMAL HIGH (ref 70–99)
Potassium: 4.7 mEq/L (ref 3.5–5.3)
Sodium: 138 mEq/L (ref 135–145)

## 2013-06-17 LAB — BRAIN NATRIURETIC PEPTIDE: Brain Natriuretic Peptide: 159.2 pg/mL — ABNORMAL HIGH (ref 0.0–100.0)

## 2013-06-19 ENCOUNTER — Other Ambulatory Visit: Payer: Self-pay | Admitting: *Deleted

## 2013-06-19 DIAGNOSIS — I10 Essential (primary) hypertension: Secondary | ICD-10-CM

## 2013-06-19 DIAGNOSIS — R0602 Shortness of breath: Secondary | ICD-10-CM

## 2013-06-23 ENCOUNTER — Ambulatory Visit (INDEPENDENT_AMBULATORY_CARE_PROVIDER_SITE_OTHER): Payer: Medicare Other | Admitting: *Deleted

## 2013-06-23 DIAGNOSIS — I4891 Unspecified atrial fibrillation: Secondary | ICD-10-CM | POA: Diagnosis not present

## 2013-06-23 LAB — REMOTE PACEMAKER DEVICE
BATTERY VOLTAGE: 2.95 V
BMOD-0002RV: 13
BRDY-0002RV: 90 {beats}/min
BRDY-0005RV: 80 {beats}/min
DEVICE MODEL PM: 7171350
RV LEAD IMPEDENCE PM: 610 Ohm
RV LEAD THRESHOLD: 0.625 V
VENTRICULAR PACING PM: 100

## 2013-06-24 ENCOUNTER — Encounter: Payer: Self-pay | Admitting: *Deleted

## 2013-06-28 ENCOUNTER — Encounter: Payer: Self-pay | Admitting: Internal Medicine

## 2013-06-30 DIAGNOSIS — I10 Essential (primary) hypertension: Secondary | ICD-10-CM | POA: Diagnosis not present

## 2013-06-30 DIAGNOSIS — R0602 Shortness of breath: Secondary | ICD-10-CM | POA: Diagnosis not present

## 2013-06-30 LAB — BASIC METABOLIC PANEL
CO2: 28 mEq/L (ref 19–32)
Calcium: 9.1 mg/dL (ref 8.4–10.5)
Chloride: 106 mEq/L (ref 96–112)
Creat: 1.43 mg/dL — ABNORMAL HIGH (ref 0.50–1.10)
Glucose, Bld: 73 mg/dL (ref 70–99)

## 2013-07-09 DIAGNOSIS — E538 Deficiency of other specified B group vitamins: Secondary | ICD-10-CM | POA: Diagnosis not present

## 2013-07-09 DIAGNOSIS — Z136 Encounter for screening for cardiovascular disorders: Secondary | ICD-10-CM | POA: Diagnosis not present

## 2013-07-09 DIAGNOSIS — N184 Chronic kidney disease, stage 4 (severe): Secondary | ICD-10-CM | POA: Diagnosis not present

## 2013-07-09 DIAGNOSIS — Z0000 Encounter for general adult medical examination without abnormal findings: Secondary | ICD-10-CM | POA: Diagnosis not present

## 2013-07-09 DIAGNOSIS — M109 Gout, unspecified: Secondary | ICD-10-CM | POA: Diagnosis not present

## 2013-07-09 DIAGNOSIS — I5022 Chronic systolic (congestive) heart failure: Secondary | ICD-10-CM | POA: Diagnosis not present

## 2013-07-09 DIAGNOSIS — Z23 Encounter for immunization: Secondary | ICD-10-CM | POA: Diagnosis not present

## 2013-07-09 DIAGNOSIS — I10 Essential (primary) hypertension: Secondary | ICD-10-CM | POA: Diagnosis not present

## 2013-08-25 ENCOUNTER — Telehealth: Payer: Self-pay | Admitting: Pulmonary Disease

## 2013-08-25 NOTE — Telephone Encounter (Signed)
I called and spoke with pt. She reports she has moved to [place]. She uses [facility] but they can't provide services to her. She is wanting to know if we know who could that is located next to her. Please advise PCC's thanks

## 2013-08-25 NOTE — Telephone Encounter (Signed)
Will call carol@ our local [facility] Tuesday about this [name] [name]

## 2013-08-26 NOTE — Telephone Encounter (Signed)
Spoke to the local [facility] office here and pt will be called once they can find out what office she can use that is near her pt is aware of this [name] [name]

## 2013-09-01 ENCOUNTER — Ambulatory Visit (INDEPENDENT_AMBULATORY_CARE_PROVIDER_SITE_OTHER): Payer: Medicare Other | Admitting: Internal Medicine

## 2013-09-01 ENCOUNTER — Encounter: Payer: Self-pay | Admitting: Internal Medicine

## 2013-09-01 VITALS — BP 158/96 | HR 110 | Ht 66.0 in | Wt 325.0 lb

## 2013-09-01 DIAGNOSIS — I10 Essential (primary) hypertension: Secondary | ICD-10-CM | POA: Diagnosis not present

## 2013-09-01 DIAGNOSIS — I5021 Acute systolic (congestive) heart failure: Secondary | ICD-10-CM | POA: Diagnosis not present

## 2013-09-01 DIAGNOSIS — I509 Heart failure, unspecified: Secondary | ICD-10-CM

## 2013-09-01 DIAGNOSIS — I495 Sick sinus syndrome: Secondary | ICD-10-CM | POA: Diagnosis not present

## 2013-09-01 DIAGNOSIS — Z950 Presence of cardiac pacemaker: Secondary | ICD-10-CM

## 2013-09-01 DIAGNOSIS — I4891 Unspecified atrial fibrillation: Secondary | ICD-10-CM | POA: Diagnosis not present

## 2013-09-01 LAB — MDC_IDC_ENUM_SESS_TYPE_INCLINIC
Battery Remaining Longevity: 96 mo
Battery Voltage: 2.95 V
Date Time Interrogation Session: 20141208113531
Implantable Pulse Generator Model: 1210
Lead Channel Impedance Value: 612.5 Ohm
Lead Channel Pacing Threshold Amplitude: 0.625 V
Lead Channel Pacing Threshold Pulse Width: 0.4 ms
Lead Channel Setting Sensing Sensitivity: 4 mV

## 2013-09-01 NOTE — Assessment & Plan Note (Signed)
Her [employer] VVI pm is working normally. Will recheck in several months.

## 2013-09-01 NOTE — Progress Notes (Signed)
HPI [name] [name] returns today for followup. 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 She has hypertension, and has refused to take any anticoagulation other than aspirin for the past 11 years. She continues to have chronic peripheral edema and venous insufficiency. She has class II systolic/diastolic heart failure symptoms. Allergies  Allergen Reactions  . Beta Adrenergic Blockers     REACTION: bradycardia  . Ciprofloxacin     REACTION: hives  . Codeine     REACTION: throat swelling  . Latex     REACTION: rash, itching  . Penicillins     REACTION: hives  . Peppermint Oil   . Prednisone     REACTION: swelling, S.O.B.  . Tape Dermatitis    States all blister up her skin     Current Outpatient Prescriptions  Medication Sig Dispense Refill  . allopurinol (ZYLOPRIM) 300 MG tablet Take 300 mg by mouth daily.        Marland Kitchen aspirin 325 MG tablet Take 325 mg by mouth daily.        . calcium gluconate 500 MG tablet Take 500 mg by mouth daily.      . COD LIVER OIL PO Take 1 tablet by mouth daily.        . colchicine 0.6 MG tablet Take 0.6 mg by mouth daily.       . colchicine-probenecid 0.5-500 MG per tablet Take 1 tablet by mouth 2 (two) times daily as needed. Gout flare up      . furosemide (LASIX) 40 MG tablet Take 40 mg by mouth daily.      Marland Kitchen HYDROcodone-acetaminophen (NORCO) 10-325 MG per tablet Take 1 tablet by mouth 3 (three) times daily.        Marland Kitchen lisinopril (PRINIVIL,ZESTRIL) 10 MG tablet Take 10 mg by mouth daily.        . Multiple Vitamin (MULTIVITAMIN) tablet Take 1 tablet by mouth daily.      . NON FORMULARY bpap As directed      . omeprazole (PRILOSEC) 20 MG capsule Take 20 mg by mouth daily.        . potassium chloride SA (K-DUR,KLOR-CON) 20 MEQ tablet Take 20 mEq by mouth daily.        No current  facility-administered medications for this visit.     Past Medical History  Diagnosis Date  . SYSTOLIC chf,EF 22-29%   . Atrial fibrillation   . Hypertension   . Bursitis   . GERD (gastroesophageal reflux disease)   . Cancer   . Cataract   . Gallstones   . Kidney stones   . Sleep apnea   . CPAP/BiPAP dependent     at night   . Morbid obesity   . Chronic cellulitis   . Degenerative joint disease   . Gout   . Diverticulitis   . Blood transfusion   . Anemia   . Sinoatrial node dysfunction     ROS:   All systems reviewed and negative except as noted in the HPI.   Past Surgical History  Procedure Laterality Date  . Abdominal hysterectomy  1991  . Pacemaker insertion  Nov 2000  . Eye surgery    . Percutaneous nephrolithotomy  April 2012     No family history on file.   History   Social History  . Marital Status: Widowed    Spouse  Name: N/A    Number of Children: N/A  . Years of Education: N/A   Occupational History  . Not on file.   Social History Main Topics  . Smoking status: Former Smoker -- 1.00 packs/day    Types: Cigarettes    Quit date: 09/25/1974  . Smokeless tobacco: Never Used  . Alcohol Use: No  . Drug Use: No  . Sexual Activity: No   Other Topics Concern  . Not on file   Social History Narrative  . No narrative on file     BP 158/96  Pulse 110  Ht 5' 6"  (1.676 m)  Wt 325 lb (147.419 kg)  BMI 52.48 kg/m2  Physical Exam:  Morbidly obese appearing middle-aged woman, NAD HEENT: Unremarkable Neck:  7 cm JVD, no thyromegally Lungs:  Clear with no wheezes, rales, or rhonchi. HEART:  Regular rate rhythm, no murmurs, no rubs, no clicks Abd:  soft, positive bowel sounds, no organomegally, no rebound, no guarding Ext:  2 plus pulses, 3+ peripheral edema, no cyanosis, no clubbing Skin:  No rashes no nodules Neuro:  CN II through XII intact, motor grossly intact  DEVICE  Normal device function.  See PaceArt for details.    Assess/Plan:

## 2013-09-01 NOTE — Assessment & Plan Note (Signed)
Her heart failure symptoms are class 2 but she is very sedentary due to her obesity and rides a scooter all day long. She will continue her current meds.

## 2013-09-01 NOTE — Assessment & Plan Note (Signed)
Her ventricular rate is well controlled as she has chb and is pacing at 90/min. She continues to refuse anti-coagulation.

## 2013-09-01 NOTE — Patient Instructions (Signed)
Your physician recommends that you schedule a follow-up appointment in:  1 year with Dr Knox Saliva will receive a reminder letter two months in advance reminding you to call and schedule your appointment. If you don't receive this letter, please contact our office.  Care link transmission on December 03, 2013.

## 2013-09-01 NOTE — Assessment & Plan Note (Signed)
Her blood pressure remains chronically elevated. She will continue her current meds. I have asked her to reduce her salt intake and po intake.

## 2013-09-02 ENCOUNTER — Encounter: Payer: Self-pay | Admitting: Internal Medicine

## 2013-09-19 ENCOUNTER — Ambulatory Visit: Payer: Medicare Other | Admitting: Internal Medicine

## 2013-10-21 ENCOUNTER — Ambulatory Visit: Payer: Medicare Other | Admitting: Pulmonary Disease

## 2013-11-19 ENCOUNTER — Encounter: Payer: Self-pay | Admitting: Internal Medicine

## 2013-11-19 DIAGNOSIS — M25539 Pain in unspecified wrist: Secondary | ICD-10-CM | POA: Diagnosis not present

## 2013-11-19 DIAGNOSIS — M25519 Pain in unspecified shoulder: Secondary | ICD-10-CM | POA: Diagnosis not present

## 2013-11-19 DIAGNOSIS — M109 Gout, unspecified: Secondary | ICD-10-CM | POA: Diagnosis not present

## 2013-11-19 DIAGNOSIS — M25469 Effusion, unspecified knee: Secondary | ICD-10-CM | POA: Diagnosis not present

## 2013-11-19 DIAGNOSIS — Z79899 Other long term (current) drug therapy: Secondary | ICD-10-CM | POA: Diagnosis not present

## 2013-11-25 ENCOUNTER — Encounter: Payer: Self-pay | Admitting: Pulmonary Disease

## 2013-11-25 ENCOUNTER — Ambulatory Visit (INDEPENDENT_AMBULATORY_CARE_PROVIDER_SITE_OTHER): Payer: Medicare Other | Admitting: Pulmonary Disease

## 2013-11-25 VITALS — BP 138/90 | HR 97 | Temp 97.5°F | Ht 65.0 in

## 2013-11-25 DIAGNOSIS — G4733 Obstructive sleep apnea (adult) (pediatric): Secondary | ICD-10-CM

## 2013-11-25 NOTE — Progress Notes (Signed)
   Subjective:    Patient ID: [name] [name], female    DOB: [birth year], 68 y.o.   MRN: [id number]  HPI  Primary Provider: Dr. [name], [facility] Siad (ENT)   67/F, morbidly obese ex- smoker for FU of obstructive sleep apnea.  She has been maintained on BiPAP since 1994, initially 15/11.  6/04 >>wt 360 lbs >> BiPAP was re-titarted to 19/12 to abolish snoring although 15/11 was adequate for events.  CPAP was titrated again on 02/17/09 ([facility], Dr Radford Pax) to 13 cm >> she did not tolerate CPAP.  Supplier is [facility],uses small nasal mask with humidity  She has no regular sleep habits, moves around in a motorised scooter.  Baseline PSG 9/11 showed AHI 30/h, RDI 86/h & desaturation to 87%.  Data 8/17-8/30/11 >> autoBIPAP - avg pr 17/15, good compliance, no residuals.  autoBiPAP download 10/25-11/8/11 >> avg pr 18/14, good compliance, AHI 3/h, no leak  Hospitalised 11/2010 Urosepsis with calculi, required PCN, required mech vent x 6ds,     11/25/2013  Annual FU   Baseline polysomnogram in September 2011,  showed AHI of 30 events per hour. She has been maintained BiPAP since she was unable to titrate CPAP with an average pressure ranging from 18/14 to 17/15 based on auto BiPAP downloads.  RPt titration showed corrected by BiPAP of 19/13 with a small fullface mask.  Pt states she wearing her BIPAP everynight x 4.6-6.5 hrs a night. denies any problems w/ mask/machine.  mask ok, pressure ok, denies excessive daytime somnolence , no choking episodes, no bed partner hisotry available for objective data She has moved to [place] with her daughter & would like to change DME   Review of Systems neg for any significant sore throat, dysphagia, itching, sneezing, nasal congestion or excess/ purulent secretions, fever, chills, sweats, unintended wt loss, pleuritic or exertional cp, hempoptysis, orthopnea pnd or change in chronic leg swelling. Also denies presyncope, palpitations,  heartburn, abdominal pain, nausea, vomiting, diarrhea or change in bowel or urinary habits, dysuria,hematuria, rash, arthralgias, visual complaints, headache, numbness weakness or ataxia.     Objective:   Physical Exam  Gen. Pleasant, obese, in no distress ENT - no lesions, no post nasal drip Neck: No JVD, no thyromegaly, no carotid bruits Lungs: no use of accessory muscles, no dullness to percussion, decreased without rales or rhonchi  Cardiovascular: Rhythm regular, heart sounds  normal, no murmurs or gallops, no peripheral edema Musculoskeletal: No deformities, no cyanosis or clubbing , no tremors       Assessment & Plan:

## 2013-11-25 NOTE — Patient Instructions (Signed)
Your Bipap is set at 19/13 cm OK to go back to nasal mask Can contact & establish with new DME closeby

## 2013-11-25 NOTE — Assessment & Plan Note (Signed)
Your Bipap is set at 19/13 cm OK to go back to nasal mask Can contact & establish with new DME closeby  Weight loss encouraged, compliance with goal of at least 4-6 hrs every night is the expectation. Advised against medications with sedative side effects Cautioned against driving when sleepy - understanding that sleepiness will vary on a day to day basis

## 2013-11-28 ENCOUNTER — Ambulatory Visit (INDEPENDENT_AMBULATORY_CARE_PROVIDER_SITE_OTHER): Payer: Medicare Other | Admitting: Internal Medicine

## 2013-11-28 ENCOUNTER — Encounter: Payer: Self-pay | Admitting: Internal Medicine

## 2013-11-28 VITALS — BP 144/80 | HR 90 | Ht 66.0 in | Wt 325.0 lb

## 2013-11-28 DIAGNOSIS — I4891 Unspecified atrial fibrillation: Secondary | ICD-10-CM

## 2013-11-28 DIAGNOSIS — I509 Heart failure, unspecified: Secondary | ICD-10-CM | POA: Diagnosis not present

## 2013-11-28 DIAGNOSIS — I5022 Chronic systolic (congestive) heart failure: Secondary | ICD-10-CM | POA: Diagnosis not present

## 2013-11-28 NOTE — Progress Notes (Signed)
HPI Patient is a 68 yo with a history of HTN, afib, chronic systolic CHF (LVEF 30 to 50%), morbid obesity  Refuses anticoagulation.  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  I saw her last September  She has been seen by Beckie Salts in interval.  Since last in clinic she says she is doing fairly well  Breathing is OK  NO CP  No dizziness. ANkles are stable  Allergies  Allergen Reactions  . Beta Adrenergic Blockers     REACTION: bradycardia  . Ciprofloxacin     REACTION: hives  . Codeine     REACTION: throat swelling  . Latex     REACTION: rash, itching  . Penicillins     REACTION: hives  . Peppermint Oil   . Prednisone     REACTION: swelling, S.O.B.  . Tape Dermatitis    States all blister up her skin    Current Outpatient Prescriptions  Medication Sig Dispense Refill  . allopurinol (ZYLOPRIM) 300 MG tablet Take 300 mg by mouth daily.        Marland Kitchen aspirin 325 MG tablet Take 325 mg by mouth daily.        . B Complex Vitamins (B-COMPLEX/B-12 PO) Take by mouth daily.      . calcium gluconate 500 MG tablet Take 500 mg by mouth daily.      . COD LIVER OIL PO Take 1 tablet by mouth daily.        . colchicine 0.6 MG tablet Take 0.6 mg by mouth daily.       . colchicine-probenecid 0.5-500 MG per tablet Take 1 tablet by mouth 2 (two) times daily as needed. Gout flare up      . furosemide (LASIX) 40 MG tablet Take 40 mg by mouth daily.      Marland Kitchen HYDROcodone-acetaminophen (NORCO) 10-325 MG per tablet Take 1 tablet by mouth 3 (three) times daily.        Marland Kitchen lisinopril (PRINIVIL,ZESTRIL) 10 MG tablet Take 10 mg by mouth daily.        . Multiple Vitamin (MULTIVITAMIN) tablet Take 1 tablet by mouth daily.      . NON FORMULARY bpap As directed      . omeprazole (PRILOSEC) 20 MG capsule Take 20 mg by mouth daily.        . potassium chloride SA (K-DUR,KLOR-CON) 20 MEQ tablet Take 20 mEq by mouth daily.        No current facility-administered medications for this visit.    Past Medical  History  Diagnosis Date  . SYSTOLIC chf,EF 03-70%   . Atrial fibrillation   . Hypertension   . Bursitis   . GERD (gastroesophageal reflux disease)   . Cancer   . Cataract   . Gallstones   . Kidney stones   . Sleep apnea   . CPAP/BiPAP dependent     at night   . Morbid obesity   . Chronic cellulitis   . Degenerative joint disease   . Gout   . Diverticulitis   . Blood transfusion   . Anemia   . Sinoatrial node dysfunction     Past Surgical History  Procedure Laterality Date  . Abdominal hysterectomy  1991  . Pacemaker insertion  Nov 2000  . Eye surgery    . Percutaneous nephrolithotomy  April 2012    No family history on file.  History   Social History  . Marital Status: Widowed  Spouse Name: N/A    Number of Children: N/A  . Years of Education: N/A   Occupational History  . Not on file.   Social History Main Topics  . Smoking status: Former Smoker -- 1.00 packs/day    Types: Cigarettes    Quit date: 09/26/1979  . Smokeless tobacco: Never Used  . Alcohol Use: No  . Drug Use: No  . Sexual Activity: No   Other Topics Concern  . Not on file   Social History Narrative  . No narrative on file    Review of Systems:  All systems reviewed.  They are negative to the above problem except as previously stated.  Vital Signs: BP 144/80  Pulse 90  Ht 5' 6"  (1.676 m)  Wt 325 lb (147.419 kg)  BMI 52.48 kg/m2  SpO2 99%  Physical Exam Patient is a morbidly obese 68 yo examined in scooter HEENT:  Normocephalic, atraumatic. EOMI, PERRLA.  Neck: JVP is difficult to assess  No bruits.  Lungs: clear to auscultation. No rales no wheezes.  Heart: Regular rate and rhythm. Normal S1, S2. No S3.   No significant murmurs. PMI not displaced.  Abdomen:  Supple, nontender. Normal bowel sounds. No masses. No hepatomegaly.  Extremities:   Good distal pulses throughout. 1+ lower extremity edema.(L greater than R) Musculoskeletal :moving all extremities.  Neuro:   alert  and oriented x3.  CN II-XII grossly intact.   Assessment and Plan:  1.  Chronic systolic CHF  Volume status looks pretty good  I would keep on same regimen  Get records(labs) from Dr Shauna Hugh office.   2.  HTN  Continue current regimen   3.  Afib  Not on anticoag 4.  S/p PPM

## 2013-11-28 NOTE — Patient Instructions (Addendum)
Your physician wants you to follow-up in: 5 months You will receive a reminder letter in the mail two months in advance. If you don't receive a letter, please call our office to schedule the follow-up appointment.   Your physician recommends that you continue on your current medications as directed. Please refer to the Current Medication list given to you today.    Thank you for choosing [facility] !

## 2013-12-01 DIAGNOSIS — H26499 Other secondary cataract, unspecified eye: Secondary | ICD-10-CM | POA: Diagnosis not present

## 2013-12-02 ENCOUNTER — Telehealth: Payer: Self-pay | Admitting: Pulmonary Disease

## 2013-12-02 DIAGNOSIS — G4733 Obstructive sleep apnea (adult) (pediatric): Secondary | ICD-10-CM

## 2013-12-02 NOTE — Telephone Encounter (Signed)
Order has been placed. I [id number] to advise the pt. [place] Bing, CMA

## 2013-12-03 ENCOUNTER — Encounter: Payer: Medicare Other | Admitting: *Deleted

## 2013-12-03 NOTE — Telephone Encounter (Signed)
Pt aware.

## 2013-12-05 DIAGNOSIS — N183 Chronic kidney disease, stage 3 unspecified: Secondary | ICD-10-CM | POA: Diagnosis not present

## 2013-12-08 ENCOUNTER — Telehealth: Payer: Self-pay | Admitting: *Deleted

## 2013-12-08 NOTE — Telephone Encounter (Signed)
Last lab results reviewed/ per Dr Harrington Challenger continue same meds. Pt agreed to plan.

## 2013-12-11 ENCOUNTER — Encounter: Payer: Self-pay | Admitting: *Deleted

## 2013-12-18 ENCOUNTER — Telehealth: Payer: Self-pay | Admitting: *Deleted

## 2013-12-18 NOTE — Telephone Encounter (Signed)
Gave pt [employer] tech sv[employer] # to trouble shoot. Pt understands her daughter must use a cell phone to trouble shoot while pt's monitor utilizes the landline.

## 2014-02-03 DIAGNOSIS — M25469 Effusion, unspecified knee: Secondary | ICD-10-CM | POA: Diagnosis not present

## 2014-02-03 DIAGNOSIS — M109 Gout, unspecified: Secondary | ICD-10-CM | POA: Diagnosis not present

## 2014-02-03 DIAGNOSIS — M25549 Pain in joints of unspecified hand: Secondary | ICD-10-CM | POA: Diagnosis not present

## 2014-02-03 DIAGNOSIS — Z79899 Other long term (current) drug therapy: Secondary | ICD-10-CM | POA: Diagnosis not present

## 2014-02-25 ENCOUNTER — Encounter: Payer: Self-pay | Admitting: Cardiology

## 2014-04-12 NOTE — Progress Notes (Signed)
HPI Patient is a 68 yo with a history of HTN, afib, chronic systolic CHF (LVEF 30 to 93%), morbid obesity  Refuses anticoagulation.  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  I saw her last in March   Since last in clinic she says she is doing fairly well  She did have increased LE swelling with blistering  Took double lasix every few days  And improved.   No chest pain    Allergies  Allergen Reactions  . Beta Adrenergic Blockers     REACTION: bradycardia  . Ciprofloxacin     REACTION: hives  . Codeine     REACTION: throat swelling  . Latex     REACTION: rash, itching  . Penicillins     REACTION: hives  . Peppermint Oil   . Prednisone     REACTION: swelling, S.O.B.  . Tape Dermatitis    States all blister up her skin    Current Outpatient Prescriptions  Medication Sig Dispense Refill  . allopurinol (ZYLOPRIM) 300 MG tablet Take 300 mg by mouth daily.        Marland Kitchen aspirin 325 MG tablet Take 325 mg by mouth daily.        . B Complex Vitamins (B-COMPLEX/B-12 PO) Take by mouth daily.      . calcium gluconate 500 MG tablet Take 500 mg by mouth daily.      . COD LIVER OIL PO Take 1 tablet by mouth daily.        . colchicine 0.6 MG tablet Take 0.6 mg by mouth daily.       . colchicine-probenecid 0.5-500 MG per tablet Take 1 tablet by mouth 2 (two) times daily as needed. Gout flare up      . HYDROcodone-acetaminophen (NORCO) 10-325 MG per tablet Take 1 tablet by mouth 3 (three) times daily.        Marland Kitchen lisinopril (PRINIVIL,ZESTRIL) 10 MG tablet Take 10 mg by mouth daily.        . Multiple Vitamin (MULTIVITAMIN) tablet Take 1 tablet by mouth daily.      . NON FORMULARY bpap As directed      . omeprazole (PRILOSEC) 20 MG capsule Take 20 mg by mouth daily.        . potassium chloride SA (K-DUR,KLOR-CON) 20 MEQ tablet Take 20 mEq by mouth daily.        No current facility-administered medications for this visit.    Past Medical History  Diagnosis Date  . SYSTOLIC chf,EF  71-69%   . Atrial fibrillation   . Hypertension   . Bursitis   . GERD (gastroesophageal reflux disease)   . Cancer   . Cataract   . Gallstones   . Kidney stones   . Sleep apnea   . CPAP/BiPAP dependent     at night   . Morbid obesity   . Chronic cellulitis   . Degenerative joint disease   . Gout   . Diverticulitis   . Blood transfusion   . Anemia   . Sinoatrial node dysfunction     Past Surgical History  Procedure Laterality Date  . Abdominal hysterectomy  1991  . Pacemaker insertion  Nov 2000  . Eye surgery    . Percutaneous nephrolithotomy  April 2012    No family history on file.  History   Social History  . Marital Status: Widowed    Spouse Name: N/A    Number of Children: N/A  .  Years of Education: N/A   Occupational History  . Not on file.   Social History Main Topics  . Smoking status: Former Smoker -- 1.00 packs/day    Types: Cigarettes    Quit date: 09/26/1979  . Smokeless tobacco: Never Used  . Alcohol Use: No  . Drug Use: No  . Sexual Activity: No   Other Topics Concern  . Not on file   Social History Narrative  . No narrative on file    Review of Systems:  All systems reviewed.  They are negative to the above problem except as previously stated.  Vital Signs: BP 120/74  Pulse 90  Ht 5' 6"  (1.676 m)  Wt 326 lb (147.873 kg)  BMI 52.64 kg/m2  Physical Exam Patient is a morbidly obese 68 yo examined in scooter HEENT:  Normocephalic, atraumatic. EOMI, PERRLA.  Neck: JVP is difficult to assess Lungs: clear to auscultation. No rales no wheezes.  Heart: Regular rate and rhythm. Normal S1, S2. No S3.   No significant murmurs. PMI not displaced.  Abdomen:  Supple, nontender. Normal bowel sounds. No masses. No hepatomegaly.  Extremities:   Good distal pulses throughout. 1-2+ lower extremity edema.(L greater than R) with erythema Musculoskeletal :moving all extremities.  Neuro:   alert and oriented x3.  CN II-XII grossly  intact.   Assessment and Plan:  1.  Chronic systolic CHF  Volume status appears mildly increased  Will get labs  Agreee with increasing lasix to 2x per day as needed with increaed K   2.  HTN  Continue current regimen  Adequate control   3.  Afib  Not on anticoag  Refuses   4.  S/p PPM  Will check with device clinic  Haing problems with remote 5.  HCM  Check lipids  Check CBC

## 2014-04-13 ENCOUNTER — Ambulatory Visit (INDEPENDENT_AMBULATORY_CARE_PROVIDER_SITE_OTHER): Payer: Medicare Other | Admitting: Internal Medicine

## 2014-04-13 ENCOUNTER — Encounter: Payer: Self-pay | Admitting: Internal Medicine

## 2014-04-13 VITALS — BP 120/74 | HR 90 | Ht 66.0 in | Wt 326.0 lb

## 2014-04-13 DIAGNOSIS — I2589 Other forms of chronic ischemic heart disease: Secondary | ICD-10-CM | POA: Diagnosis not present

## 2014-04-13 DIAGNOSIS — R0602 Shortness of breath: Secondary | ICD-10-CM

## 2014-04-13 DIAGNOSIS — I10 Essential (primary) hypertension: Secondary | ICD-10-CM

## 2014-04-13 DIAGNOSIS — I255 Ischemic cardiomyopathy: Secondary | ICD-10-CM

## 2014-04-13 DIAGNOSIS — I5033 Acute on chronic diastolic (congestive) heart failure: Secondary | ICD-10-CM

## 2014-04-13 DIAGNOSIS — I509 Heart failure, unspecified: Secondary | ICD-10-CM

## 2014-04-13 LAB — CBC
HEMATOCRIT: 35 % — AB (ref 36.0–46.0)
HEMOGLOBIN: 12 g/dL (ref 12.0–15.0)
MCH: 31.7 pg (ref 26.0–34.0)
MCHC: 34.3 g/dL (ref 30.0–36.0)
MCV: 92.3 fL (ref 78.0–100.0)
Platelets: 152 10*3/uL (ref 150–400)
RBC: 3.79 MIL/uL — AB (ref 3.87–5.11)
RDW: 14.7 % (ref 11.5–15.5)
WBC: 5.3 10*3/uL (ref 4.0–10.5)

## 2014-04-13 MED ORDER — FUROSEMIDE 40 MG PO TABS: 40.0000 mg | ORAL_TABLET | Freq: Every day | ORAL | Status: DC

## 2014-04-13 NOTE — Patient Instructions (Signed)
Your physician wants you to follow-up in: 26month You will receive a reminder letter in the mail two months in advance. If you don't receive a letter, please call our office to schedule the follow-up appointment.     Your physician recommends that you continue on your current medications as directed. Please refer to the Current Medication list given to you today.     Please get blood work today    Thank you for choosing [facility]!

## 2014-04-14 ENCOUNTER — Other Ambulatory Visit: Payer: Self-pay

## 2014-04-14 LAB — BASIC METABOLIC PANEL
BUN: 16 mg/dL (ref 6–23)
CHLORIDE: 101 meq/L (ref 96–112)
CO2: 27 mEq/L (ref 19–32)
CREATININE: 1.36 mg/dL — AB (ref 0.50–1.10)
Calcium: 10 mg/dL (ref 8.4–10.5)
Glucose, Bld: 86 mg/dL (ref 70–99)
POTASSIUM: 4.3 meq/L (ref 3.5–5.3)
SODIUM: 138 meq/L (ref 135–145)

## 2014-04-14 LAB — LIPID PANEL
CHOL/HDL RATIO: 3.8 ratio
Cholesterol: 144 mg/dL (ref 0–200)
HDL: 38 mg/dL — AB (ref >39)
LDL CALC: 77 mg/dL (ref 0–99)
TRIGLYCERIDES: 146 mg/dL (ref <150)
VLDL: 29 mg/dL (ref 0–40)

## 2014-04-14 LAB — SEDIMENTATION RATE: Sed Rate: 5 mm/hr (ref 0–22)

## 2014-04-14 LAB — URIC ACID: URIC ACID, SERUM: 4.8 mg/dL (ref 2.4–7.0)

## 2014-04-14 LAB — BRAIN NATRIURETIC PEPTIDE: Brain Natriuretic Peptide: 122 pg/mL — ABNORMAL HIGH (ref 0.0–100.0)

## 2014-04-14 MED ORDER — FUROSEMIDE 40 MG PO TABS: 40.0000 mg | ORAL_TABLET | Freq: Two times a day (BID) | ORAL | Status: DC

## 2014-04-14 MED ORDER — POTASSIUM CHLORIDE CRYS ER 20 MEQ PO TBCR: 20.0000 meq | EXTENDED_RELEASE_TABLET | Freq: Two times a day (BID) | ORAL | Status: DC

## 2014-04-15 ENCOUNTER — Telehealth: Payer: Self-pay | Admitting: *Deleted

## 2014-04-15 NOTE — Telephone Encounter (Signed)
Message copied by Desma Mcgregor on Wed Apr 15, 2014  4:22 PM ------      Message from: Fay Records      Created: Tue Apr 14, 2014 10:47 PM       BMET is OK      CBC is OK      Lipids are good.       Uric acid normal      Inflammation marker is normal      Fluid number up minimally  We discussed meds in clinic ------

## 2014-04-15 NOTE — Telephone Encounter (Signed)
Notified pt of results 

## 2014-05-05 DIAGNOSIS — M719 Bursopathy, unspecified: Secondary | ICD-10-CM | POA: Diagnosis not present

## 2014-05-05 DIAGNOSIS — L408 Other psoriasis: Secondary | ICD-10-CM | POA: Diagnosis not present

## 2014-05-05 DIAGNOSIS — M109 Gout, unspecified: Secondary | ICD-10-CM | POA: Diagnosis not present

## 2014-05-05 DIAGNOSIS — M67919 Unspecified disorder of synovium and tendon, unspecified shoulder: Secondary | ICD-10-CM | POA: Diagnosis not present

## 2014-05-05 DIAGNOSIS — M25549 Pain in joints of unspecified hand: Secondary | ICD-10-CM | POA: Diagnosis not present

## 2014-05-05 DIAGNOSIS — M1710 Unilateral primary osteoarthritis, unspecified knee: Secondary | ICD-10-CM | POA: Diagnosis not present

## 2014-07-03 ENCOUNTER — Encounter: Payer: Self-pay | Admitting: Cardiology

## 2014-07-15 ENCOUNTER — Telehealth: Payer: Self-pay | Admitting: Internal Medicine

## 2014-07-15 NOTE — Telephone Encounter (Signed)
New message     Pt can't check pacemaker from home because [place] out---they do not get good reception.  She got a letter.  Please call

## 2014-07-16 NOTE — Telephone Encounter (Signed)
Pt does not have cell adapter. Pt confused that [employer] suggested calling from a cell phone to trouble shoot the home landline. Pt convinced cell phone is necessary to transmit. I let her know cell phone is not necessary. I asked if there's an additional phone jack she can use. Pt states she's crippled and cannot go upstairs where there's another jack. I let pt know it's okay, due to her type of device, being checked once per year is fine. ROV w/ Dr. Lovena Le in [facility] 08/27/14.

## 2014-07-20 DIAGNOSIS — Z23 Encounter for immunization: Secondary | ICD-10-CM | POA: Diagnosis not present

## 2014-07-20 DIAGNOSIS — Z0000 Encounter for general adult medical examination without abnormal findings: Secondary | ICD-10-CM | POA: Diagnosis not present

## 2014-07-20 DIAGNOSIS — E876 Hypokalemia: Secondary | ICD-10-CM | POA: Diagnosis not present

## 2014-07-20 DIAGNOSIS — R609 Edema, unspecified: Secondary | ICD-10-CM | POA: Diagnosis not present

## 2014-07-20 DIAGNOSIS — Z136 Encounter for screening for cardiovascular disorders: Secondary | ICD-10-CM | POA: Diagnosis not present

## 2014-07-20 DIAGNOSIS — Z79899 Other long term (current) drug therapy: Secondary | ICD-10-CM | POA: Diagnosis not present

## 2014-07-20 DIAGNOSIS — I10 Essential (primary) hypertension: Secondary | ICD-10-CM | POA: Diagnosis not present

## 2014-07-20 DIAGNOSIS — M109 Gout, unspecified: Secondary | ICD-10-CM | POA: Diagnosis not present

## 2014-07-27 ENCOUNTER — Telehealth: Payer: Self-pay | Admitting: *Deleted

## 2014-07-27 NOTE — Telephone Encounter (Signed)
Received labs, in Dr. Harrington Challenger folder, also faxed copy to [address] office

## 2014-08-06 DIAGNOSIS — M170 Bilateral primary osteoarthritis of knee: Secondary | ICD-10-CM | POA: Diagnosis not present

## 2014-08-06 DIAGNOSIS — M25442 Effusion, left hand: Secondary | ICD-10-CM | POA: Diagnosis not present

## 2014-08-06 DIAGNOSIS — M25441 Effusion, right hand: Secondary | ICD-10-CM | POA: Diagnosis not present

## 2014-08-06 DIAGNOSIS — M7989 Other specified soft tissue disorders: Secondary | ICD-10-CM | POA: Diagnosis not present

## 2014-08-06 DIAGNOSIS — Z7952 Long term (current) use of systemic steroids: Secondary | ICD-10-CM | POA: Diagnosis not present

## 2014-08-06 DIAGNOSIS — M109 Gout, unspecified: Secondary | ICD-10-CM | POA: Diagnosis not present

## 2014-08-06 DIAGNOSIS — N183 Chronic kidney disease, stage 3 (moderate): Secondary | ICD-10-CM | POA: Diagnosis not present

## 2014-08-06 DIAGNOSIS — L409 Psoriasis, unspecified: Secondary | ICD-10-CM | POA: Diagnosis not present

## 2014-08-18 ENCOUNTER — Telehealth: Payer: Self-pay | Admitting: *Deleted

## 2014-08-18 ENCOUNTER — Encounter: Payer: Self-pay | Admitting: *Deleted

## 2014-08-18 NOTE — Telephone Encounter (Signed)
Mailed letter with lab results per Dr. Harrington Challenger. Labs to be scanned into chart

## 2014-08-24 ENCOUNTER — Encounter: Payer: Self-pay | Admitting: Internal Medicine

## 2014-08-27 ENCOUNTER — Ambulatory Visit (INDEPENDENT_AMBULATORY_CARE_PROVIDER_SITE_OTHER): Payer: Medicare Other | Admitting: Internal Medicine

## 2014-08-27 ENCOUNTER — Encounter: Payer: Self-pay | Admitting: Internal Medicine

## 2014-08-27 VITALS — BP 114/76 | HR 90 | Ht 66.0 in | Wt 325.0 lb

## 2014-08-27 DIAGNOSIS — Z950 Presence of cardiac pacemaker: Secondary | ICD-10-CM | POA: Diagnosis not present

## 2014-08-27 DIAGNOSIS — I255 Ischemic cardiomyopathy: Secondary | ICD-10-CM

## 2014-08-27 DIAGNOSIS — I495 Sick sinus syndrome: Secondary | ICD-10-CM

## 2014-08-27 DIAGNOSIS — I482 Chronic atrial fibrillation, unspecified: Secondary | ICD-10-CM

## 2014-08-27 DIAGNOSIS — I5021 Acute systolic (congestive) heart failure: Secondary | ICD-10-CM | POA: Diagnosis not present

## 2014-08-27 DIAGNOSIS — I4891 Unspecified atrial fibrillation: Secondary | ICD-10-CM | POA: Diagnosis not present

## 2014-08-27 LAB — MDC_IDC_ENUM_SESS_TYPE_INCLINIC
Battery Remaining Longevity: 102 mo
Battery Voltage: 2.93 V
Brady Statistic RV Percent Paced: 99.79 %
Date Time Interrogation Session: 20151203093729
Implantable Pulse Generator Model: 1210
Lead Channel Pacing Threshold Amplitude: 0.625 V
Lead Channel Pacing Threshold Pulse Width: 0.4 ms
Lead Channel Sensing Intrinsic Amplitude: 12 mV
Lead Channel Setting Pacing Pulse Width: 0.4 ms
MDC IDC MSMT LEADCHNL RV IMPEDANCE VALUE: 587.5 Ohm
MDC IDC PG SERIAL: 7171350
MDC IDC SET LEADCHNL RV PACING AMPLITUDE: 0.875
MDC IDC SET LEADCHNL RV SENSING SENSITIVITY: 4 mV

## 2014-08-27 NOTE — Assessment & Plan Note (Signed)
Her [employer] PPM is working normally. Will recheck in several months.

## 2014-08-27 NOTE — Assessment & Plan Note (Signed)
Her ventricular rate is well controlled. No change in meds. She continues to refuse anti-coagulation.

## 2014-08-27 NOTE — Assessment & Plan Note (Signed)
She is euvolemic today and has not been in the hospital. She would be a candidate for a BiV device but her morbid obesity and multiple other comorbidities makes the procedure very risky and I would not consider unless she develops worsening heart failure symptoms.

## 2014-08-27 NOTE — Progress Notes (Signed)
HPI Mrs. Nachreiner returns today for followup. 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 She has hypertension, and has refused to take any anticoagulation other than aspirin for the past 12 years. She continues to have chronic peripheral edema and venous insufficiency. She has class II systolic/diastolic heart failure symptoms. She has developed worsening renal insufficiency and is pending a visit to see Dr. Jimmy Footman.  Allergies  Allergen Reactions  . Beta Adrenergic Blockers     REACTION: bradycardia  . Ciprofloxacin     REACTION: hives  . Codeine     REACTION: throat swelling  . Latex     REACTION: rash, itching  . Penicillins     REACTION: hives  . Peppermint Oil   . Prednisone     REACTION: swelling, S.O.B.  . Tape Dermatitis    States all blister up her skin     Current Outpatient Prescriptions  Medication Sig Dispense Refill  . allopurinol (ZYLOPRIM) 300 MG tablet Take 300 mg by mouth daily.      Marland Kitchen aspirin 325 MG tablet Take 325 mg by mouth daily.      . B Complex Vitamins (B-COMPLEX/B-12 PO) Take by mouth daily.    . calcium gluconate 500 MG tablet Take 500 mg by mouth daily.    . COD LIVER OIL PO Take 1 tablet by mouth daily.      . colchicine 0.6 MG tablet Take 0.6 mg by mouth daily.     . colchicine-probenecid 0.5-500 MG per tablet Take 1 tablet by mouth 2 (two) times daily as needed. Gout flare up    . furosemide (LASIX) 40 MG tablet Take 1 tablet (40 mg total) by mouth 2 (two) times daily. 180 tablet 3  . HYDROcodone-acetaminophen (NORCO) 10-325 MG per tablet Take 1 tablet by mouth 3 (three) times daily.      . Multiple Vitamin (MULTIVITAMIN) tablet Take 1 tablet by mouth daily.    . NON FORMULARY bpap As directed    . omeprazole (PRILOSEC) 20 MG capsule Take 20 mg by mouth daily.      . potassium  chloride SA (K-DUR,KLOR-CON) 20 MEQ tablet Take 1 tablet (20 mEq total) by mouth 2 (two) times daily. 180 tablet 3   No current facility-administered medications for this visit.     Past Medical History  Diagnosis Date  . SYSTOLIC chf,EF 88-28%   . Atrial fibrillation   . Hypertension   . Bursitis   . GERD (gastroesophageal reflux disease)   . Cancer   . Cataract   . Gallstones   . Kidney stones   . Sleep apnea   . CPAP/BiPAP dependent     at night   . Morbid obesity   . Chronic cellulitis   . Degenerative joint disease   . Gout   . Diverticulitis   . Blood transfusion   . Anemia   . Sinoatrial node dysfunction     ROS:   All systems reviewed and negative except as noted in the HPI.   Past Surgical History  Procedure Laterality Date  . Abdominal hysterectomy  1991  . Pacemaker insertion  Nov 2000  . Eye surgery    . Percutaneous nephrolithotomy  April 2012     No family history on file.   History   Social History  . Marital Status: Widowed    Spouse Name:  N/A    Number of Children: N/A  . Years of Education: N/A   Occupational History  . Not on file.   Social History Main Topics  . Smoking status: Former Smoker -- 1.00 packs/day    Types: Cigarettes    Quit date: 09/26/1979  . Smokeless tobacco: Never Used  . Alcohol Use: No  . Drug Use: No  . Sexual Activity: No   Other Topics Concern  . Not on file   Social History Narrative  . No narrative on file     BP 114/76 mmHg  Pulse 90  Ht 5' 6"  (1.676 m)  Wt 325 lb (147.419 kg)  BMI 52.48 kg/m2  Physical Exam:  Morbidly obese appearing middle-aged woman, NAD HEENT: Unremarkable Neck:  7 cm JVD, no thyromegally Lungs:  Clear with no wheezes, rales, or rhonchi. HEART:  Regular rate rhythm, no murmurs, no rubs, no clicks Abd:  soft, positive bowel sounds, no organomegally, no rebound, no guarding Ext:  2 plus pulses, 2+ peripheral edema, no cyanosis, no clubbing Skin:  No rashes no  nodules Neuro:  CN II through XII intact, motor grossly intact  DEVICE  Normal device function.  See PaceArt for details.   Assess/Plan:

## 2014-08-27 NOTE — Patient Instructions (Addendum)
Your physician wants you to follow-up in: [place] will receive a reminder letter in the mail two months in advance. If you don't receive a letter, please call our office to schedule the follow-up appointment.  Your physician recommends that you continue on your current medications as directed. Please refer to the Current Medication list given to you today.  Thank you for choosing [facility]!!

## 2014-08-28 ENCOUNTER — Encounter: Payer: Self-pay | Admitting: Internal Medicine

## 2014-08-28 DIAGNOSIS — N184 Chronic kidney disease, stage 4 (severe): Secondary | ICD-10-CM | POA: Diagnosis not present

## 2014-08-28 DIAGNOSIS — M109 Gout, unspecified: Secondary | ICD-10-CM | POA: Diagnosis not present

## 2014-08-28 DIAGNOSIS — I129 Hypertensive chronic kidney disease with stage 1 through stage 4 chronic kidney disease, or unspecified chronic kidney disease: Secondary | ICD-10-CM | POA: Diagnosis not present

## 2014-08-28 DIAGNOSIS — D631 Anemia in chronic kidney disease: Secondary | ICD-10-CM | POA: Diagnosis not present

## 2014-08-28 DIAGNOSIS — N189 Chronic kidney disease, unspecified: Secondary | ICD-10-CM | POA: Diagnosis not present

## 2014-08-28 DIAGNOSIS — N2581 Secondary hyperparathyroidism of renal origin: Secondary | ICD-10-CM | POA: Diagnosis not present

## 2014-08-28 LAB — PACEMAKER DEVICE OBSERVATION

## 2014-09-01 ENCOUNTER — Other Ambulatory Visit: Payer: Self-pay | Admitting: Nephrology

## 2014-09-01 DIAGNOSIS — N184 Chronic kidney disease, stage 4 (severe): Secondary | ICD-10-CM

## 2014-09-03 ENCOUNTER — Other Ambulatory Visit (HOSPITAL_COMMUNITY): Payer: Self-pay | Admitting: Nephrology

## 2014-09-03 DIAGNOSIS — N184 Chronic kidney disease, stage 4 (severe): Secondary | ICD-10-CM

## 2014-09-04 ENCOUNTER — Other Ambulatory Visit (HOSPITAL_COMMUNITY): Payer: Self-pay | Admitting: Nephrology

## 2014-09-04 ENCOUNTER — Other Ambulatory Visit: Payer: Medicare Other

## 2014-09-04 DIAGNOSIS — N184 Chronic kidney disease, stage 4 (severe): Secondary | ICD-10-CM

## 2014-09-04 DIAGNOSIS — I129 Hypertensive chronic kidney disease with stage 1 through stage 4 chronic kidney disease, or unspecified chronic kidney disease: Secondary | ICD-10-CM

## 2014-09-08 ENCOUNTER — Ambulatory Visit (HOSPITAL_COMMUNITY): Payer: Medicare Other

## 2014-09-09 ENCOUNTER — Ambulatory Visit (HOSPITAL_COMMUNITY)
Admission: RE | Admit: 2014-09-09 | Discharge: 2014-09-09 | Disposition: A | Discharge status: Home / Self Care | Payer: Medicare Other | Source: Ambulatory Visit | Attending: Family Medicine | Admitting: Family Medicine

## 2014-09-09 DIAGNOSIS — N184 Chronic kidney disease, stage 4 (severe): Secondary | ICD-10-CM | POA: Insufficient documentation

## 2014-09-09 DIAGNOSIS — I129 Hypertensive chronic kidney disease with stage 1 through stage 4 chronic kidney disease, or unspecified chronic kidney disease: Secondary | ICD-10-CM

## 2014-09-09 DIAGNOSIS — I10 Essential (primary) hypertension: Secondary | ICD-10-CM

## 2014-10-06 DIAGNOSIS — D631 Anemia in chronic kidney disease: Secondary | ICD-10-CM | POA: Diagnosis not present

## 2014-10-06 DIAGNOSIS — N2581 Secondary hyperparathyroidism of renal origin: Secondary | ICD-10-CM | POA: Diagnosis not present

## 2014-10-06 DIAGNOSIS — N183 Chronic kidney disease, stage 3 (moderate): Secondary | ICD-10-CM | POA: Diagnosis not present

## 2014-10-06 DIAGNOSIS — I129 Hypertensive chronic kidney disease with stage 1 through stage 4 chronic kidney disease, or unspecified chronic kidney disease: Secondary | ICD-10-CM | POA: Diagnosis not present

## 2014-10-18 NOTE — Progress Notes (Signed)
This encounter was created in error - please disregard.

## 2014-10-19 ENCOUNTER — Encounter: Payer: Medicare Other | Admitting: Internal Medicine

## 2014-10-29 DIAGNOSIS — N189 Chronic kidney disease, unspecified: Secondary | ICD-10-CM | POA: Diagnosis not present

## 2014-10-29 DIAGNOSIS — M25462 Effusion, left knee: Secondary | ICD-10-CM | POA: Diagnosis not present

## 2014-10-29 DIAGNOSIS — M109 Gout, unspecified: Secondary | ICD-10-CM | POA: Diagnosis not present

## 2014-10-29 DIAGNOSIS — M25441 Effusion, right hand: Secondary | ICD-10-CM | POA: Diagnosis not present

## 2014-10-29 DIAGNOSIS — M25461 Effusion, right knee: Secondary | ICD-10-CM | POA: Diagnosis not present

## 2014-10-30 ENCOUNTER — Emergency Department (HOSPITAL_COMMUNITY): Payer: Medicare Other

## 2014-10-30 ENCOUNTER — Encounter (HOSPITAL_COMMUNITY): Payer: Self-pay | Admitting: Emergency Medicine

## 2014-10-30 ENCOUNTER — Telehealth: Payer: Self-pay | Admitting: Internal Medicine

## 2014-10-30 ENCOUNTER — Observation Stay (HOSPITAL_COMMUNITY)
Admission: EM | Admit: 2014-10-30 | Discharge: 2014-11-02 | Disposition: A | Discharge status: Home / Self Care | Payer: Medicare Other | Attending: Internal Medicine | Admitting: Internal Medicine

## 2014-10-30 DIAGNOSIS — R0602 Shortness of breath: Secondary | ICD-10-CM | POA: Diagnosis present

## 2014-10-30 DIAGNOSIS — M549 Dorsalgia, unspecified: Secondary | ICD-10-CM | POA: Diagnosis not present

## 2014-10-30 DIAGNOSIS — Z87442 Personal history of urinary calculi: Secondary | ICD-10-CM | POA: Insufficient documentation

## 2014-10-30 DIAGNOSIS — M109 Gout, unspecified: Secondary | ICD-10-CM | POA: Insufficient documentation

## 2014-10-30 DIAGNOSIS — R0789 Other chest pain: Secondary | ICD-10-CM

## 2014-10-30 DIAGNOSIS — I509 Heart failure, unspecified: Secondary | ICD-10-CM

## 2014-10-30 DIAGNOSIS — G4730 Sleep apnea, unspecified: Secondary | ICD-10-CM | POA: Diagnosis not present

## 2014-10-30 DIAGNOSIS — K219 Gastro-esophageal reflux disease without esophagitis: Secondary | ICD-10-CM | POA: Insufficient documentation

## 2014-10-30 DIAGNOSIS — R05 Cough: Secondary | ICD-10-CM | POA: Diagnosis not present

## 2014-10-30 DIAGNOSIS — Z872 Personal history of diseases of the skin and subcutaneous tissue: Secondary | ICD-10-CM | POA: Diagnosis not present

## 2014-10-30 DIAGNOSIS — R079 Chest pain, unspecified: Secondary | ICD-10-CM | POA: Diagnosis not present

## 2014-10-30 DIAGNOSIS — Z7982 Long term (current) use of aspirin: Secondary | ICD-10-CM | POA: Insufficient documentation

## 2014-10-30 DIAGNOSIS — Z880 Allergy status to penicillin: Secondary | ICD-10-CM | POA: Insufficient documentation

## 2014-10-30 DIAGNOSIS — I10 Essential (primary) hypertension: Secondary | ICD-10-CM | POA: Insufficient documentation

## 2014-10-30 DIAGNOSIS — I4891 Unspecified atrial fibrillation: Secondary | ICD-10-CM | POA: Diagnosis not present

## 2014-10-30 DIAGNOSIS — K8020 Calculus of gallbladder without cholecystitis without obstruction: Secondary | ICD-10-CM | POA: Diagnosis not present

## 2014-10-30 DIAGNOSIS — I495 Sick sinus syndrome: Secondary | ICD-10-CM | POA: Insufficient documentation

## 2014-10-30 DIAGNOSIS — M1990 Unspecified osteoarthritis, unspecified site: Secondary | ICD-10-CM | POA: Diagnosis not present

## 2014-10-30 DIAGNOSIS — D649 Anemia, unspecified: Secondary | ICD-10-CM | POA: Diagnosis not present

## 2014-10-30 DIAGNOSIS — Z91040 Latex allergy status: Secondary | ICD-10-CM | POA: Diagnosis not present

## 2014-10-30 DIAGNOSIS — I517 Cardiomegaly: Secondary | ICD-10-CM | POA: Diagnosis not present

## 2014-10-30 DIAGNOSIS — I5022 Chronic systolic (congestive) heart failure: Principal | ICD-10-CM | POA: Insufficient documentation

## 2014-10-30 DIAGNOSIS — Z79899 Other long term (current) drug therapy: Secondary | ICD-10-CM | POA: Diagnosis not present

## 2014-10-30 DIAGNOSIS — K5792 Diverticulitis of intestine, part unspecified, without perforation or abscess without bleeding: Secondary | ICD-10-CM | POA: Diagnosis not present

## 2014-10-30 DIAGNOSIS — R110 Nausea: Secondary | ICD-10-CM | POA: Insufficient documentation

## 2014-10-30 DIAGNOSIS — Z950 Presence of cardiac pacemaker: Secondary | ICD-10-CM | POA: Insufficient documentation

## 2014-10-30 DIAGNOSIS — Z87891 Personal history of nicotine dependence: Secondary | ICD-10-CM | POA: Diagnosis not present

## 2014-10-30 DIAGNOSIS — I5023 Acute on chronic systolic (congestive) heart failure: Secondary | ICD-10-CM

## 2014-10-30 DIAGNOSIS — J811 Chronic pulmonary edema: Secondary | ICD-10-CM | POA: Diagnosis not present

## 2014-10-30 DIAGNOSIS — H269 Unspecified cataract: Secondary | ICD-10-CM | POA: Insufficient documentation

## 2014-10-30 DIAGNOSIS — I5042 Chronic combined systolic (congestive) and diastolic (congestive) heart failure: Secondary | ICD-10-CM | POA: Diagnosis present

## 2014-10-30 HISTORY — DX: Essential (primary) hypertension: I10

## 2014-10-30 LAB — BASIC METABOLIC PANEL
Anion gap: 11 (ref 5–15)
BUN: 24 mg/dL — ABNORMAL HIGH (ref 6–23)
CO2: 28 mmol/L (ref 19–32)
Calcium: 9.6 mg/dL (ref 8.4–10.5)
Chloride: 103 mmol/L (ref 96–112)
Creatinine, Ser: 1.56 mg/dL — ABNORMAL HIGH (ref 0.50–1.10)
GFR calc Af Amer: 38 mL/min — ABNORMAL LOW (ref >90)
GFR calc non Af Amer: 33 mL/min — ABNORMAL LOW (ref >90)
Glucose, Bld: 145 mg/dL — ABNORMAL HIGH (ref 70–99)
Potassium: 3.9 mmol/L (ref 3.5–5.1)
Sodium: 142 mmol/L (ref 135–145)

## 2014-10-30 LAB — CBC WITH DIFFERENTIAL/PLATELET
Basophils Absolute: 0 10*3/uL (ref 0.0–0.1)
Basophils Relative: 0 % (ref 0–1)
Eosinophils Absolute: 0 10*3/uL (ref 0.0–0.7)
Eosinophils Relative: 1 % (ref 0–5)
HCT: 36.6 % (ref 36.0–46.0)
Hemoglobin: 11.8 g/dL — ABNORMAL LOW (ref 12.0–15.0)
Lymphocytes Relative: 16 % (ref 12–46)
Lymphs Abs: 0.8 10*3/uL (ref 0.7–4.0)
MCH: 30.9 pg (ref 26.0–34.0)
MCHC: 32.2 g/dL (ref 30.0–36.0)
MCV: 95.8 fL (ref 78.0–100.0)
Monocytes Absolute: 0.3 10*3/uL (ref 0.1–1.0)
Monocytes Relative: 6 % (ref 3–12)
Neutro Abs: 3.9 10*3/uL (ref 1.7–7.7)
Neutrophils Relative %: 77 % (ref 43–77)
Platelets: 150 10*3/uL (ref 150–400)
RBC: 3.82 MIL/uL — ABNORMAL LOW (ref 3.87–5.11)
RDW: 14.8 % (ref 11.5–15.5)
WBC: 5 10*3/uL (ref 4.0–10.5)

## 2014-10-30 LAB — TROPONIN I: Troponin I: <0.03 ng/mL (ref <0.031)

## 2014-10-30 LAB — BRAIN NATRIURETIC PEPTIDE: B Natriuretic Peptide: 214 pg/mL — ABNORMAL HIGH (ref 0.0–100.0)

## 2014-10-30 MED ORDER — CALCITRIOL 0.25 MCG PO CAPS
0.2500 ug | ORAL_CAPSULE | Freq: Every day | ORAL | Status: DC
  Administered 2014-10-31 – 2014-11-02 (×3): 0.25 ug via ORAL
  Filled 2014-10-30 (×3): qty 1

## 2014-10-30 MED ORDER — HYDROCODONE-ACETAMINOPHEN 10-325 MG PO TABS
1.0000 | ORAL_TABLET | Freq: Four times a day (QID) | ORAL | Status: DC | PRN
  Administered 2014-10-31 – 2014-11-01 (×5): 1 via ORAL
  Filled 2014-10-30 (×5): qty 1

## 2014-10-30 MED ORDER — POTASSIUM CHLORIDE CRYS ER 20 MEQ PO TBCR
20.0000 meq | EXTENDED_RELEASE_TABLET | Freq: Two times a day (BID) | ORAL | Status: DC
  Administered 2014-10-30 – 2014-11-02 (×6): 20 meq via ORAL
  Filled 2014-10-30 (×6): qty 1

## 2014-10-30 MED ORDER — ALLOPURINOL 300 MG PO TABS
300.0000 mg | ORAL_TABLET | Freq: Every day | ORAL | Status: DC
  Administered 2014-10-30 – 2014-11-01 (×3): 300 mg via ORAL
  Filled 2014-10-30 (×3): qty 1

## 2014-10-30 MED ORDER — CYCLOBENZAPRINE HCL 10 MG PO TABS: 5.0000 mg | ORAL_TABLET | Freq: Three times a day (TID) | ORAL | Status: DC | PRN

## 2014-10-30 MED ORDER — MORPHINE SULFATE 2 MG/ML IJ SOLN
2.0000 mg | INTRAMUSCULAR | Status: DC | PRN
  Filled 2014-10-30: qty 1

## 2014-10-30 MED ORDER — SODIUM CHLORIDE 0.9 % IJ SOLN
3.0000 mL | Freq: Two times a day (BID) | INTRAMUSCULAR | Status: DC
  Administered 2014-10-31: 3 mL via INTRAVENOUS
  Administered 2014-10-31: 10 mL via INTRAVENOUS
  Administered 2014-11-01 – 2014-11-02 (×2): 3 mL via INTRAVENOUS

## 2014-10-30 MED ORDER — DOCUSATE SODIUM 100 MG PO CAPS: 100.0000 mg | ORAL_CAPSULE | Freq: Every day | ORAL | Status: DC | PRN

## 2014-10-30 MED ORDER — CALCIUM CARBONATE 1250 (500 CA) MG PO TABS
1250.0000 mg | ORAL_TABLET | Freq: Every day | ORAL | Status: DC
  Administered 2014-10-31 – 2014-11-01 (×2): 1250 mg via ORAL
  Filled 2014-10-30 (×2): qty 3

## 2014-10-30 MED ORDER — PANTOPRAZOLE SODIUM 40 MG PO TBEC
40.0000 mg | DELAYED_RELEASE_TABLET | Freq: Every day | ORAL | Status: DC
  Administered 2014-10-31 – 2014-11-02 (×3): 40 mg via ORAL
  Filled 2014-10-30 (×3): qty 1

## 2014-10-30 MED ORDER — ASPIRIN 325 MG PO TABS
325.0000 mg | ORAL_TABLET | Freq: Every day | ORAL | Status: DC
  Administered 2014-10-31 – 2014-11-02 (×3): 325 mg via ORAL
  Filled 2014-10-30 (×3): qty 1

## 2014-10-30 MED ORDER — COLCHICINE-PROBENECID 0.5-500 MG PO TABS
1.0000 | ORAL_TABLET | Freq: Two times a day (BID) | ORAL | Status: DC
  Filled 2014-10-30 (×6): qty 1

## 2014-10-30 MED ORDER — SODIUM CHLORIDE 0.9 % IV SOLN: 250.0000 mL | INTRAVENOUS | Status: DC | PRN

## 2014-10-30 MED ORDER — FUROSEMIDE 10 MG/ML IJ SOLN
40.0000 mg | Freq: Two times a day (BID) | INTRAMUSCULAR | Status: DC
  Administered 2014-10-31 (×2): 40 mg via INTRAVENOUS
  Filled 2014-10-30 (×2): qty 4

## 2014-10-30 MED ORDER — ACETAMINOPHEN 325 MG PO TABS: 650.0000 mg | ORAL_TABLET | ORAL | Status: DC | PRN

## 2014-10-30 MED ORDER — FUROSEMIDE 10 MG/ML IJ SOLN
60.0000 mg | Freq: Once | INTRAMUSCULAR | Status: AC
  Administered 2014-10-30: 60 mg via INTRAVENOUS
  Filled 2014-10-30: qty 6

## 2014-10-30 MED ORDER — SODIUM CHLORIDE 0.9 % IJ SOLN: 3.0000 mL | INTRAMUSCULAR | Status: DC | PRN

## 2014-10-30 MED ORDER — ENOXAPARIN SODIUM 40 MG/0.4ML ~~LOC~~ SOLN
40.0000 mg | SUBCUTANEOUS | Status: DC
  Administered 2014-10-30 – 2014-11-01 (×3): 40 mg via SUBCUTANEOUS
  Filled 2014-10-30 (×3): qty 0.4

## 2014-10-30 MED ORDER — HYDROCODONE-ACETAMINOPHEN 10-325 MG PO TABS
1.0000 | ORAL_TABLET | Freq: Three times a day (TID) | ORAL | Status: DC
  Administered 2014-10-30: 1 via ORAL
  Filled 2014-10-30: qty 1

## 2014-10-30 MED ORDER — ONDANSETRON HCL 4 MG/2ML IJ SOLN
4.0000 mg | Freq: Four times a day (QID) | INTRAMUSCULAR | Status: DC | PRN
Start: 2014-10-30 — End: 2014-11-02

## 2014-10-30 NOTE — ED Notes (Signed)
Patient complaining of chest pressure with shortness of breath since yesterday.

## 2014-10-30 NOTE — ED Provider Notes (Signed)
CSN: [id number]     Arrival date & time 10/30/14  1220 History  This chart was scribed for Virgel Manifold, MD by Einar Pheasant, ED Scribe. This patient was seen in room APA18/[facility]18 and the patient's care was started at 3:41 PM.    Chief Complaint  Patient presents with  . Shortness of Breath   The history is provided by the patient and medical records. No language interpreter was used.   HPI Comments: [name] [name] is a 69 y.o. female with PMhx of atrial fibrillation, HTN, GERD, and pacemaker presents to the Emergency Department complaining of sudden onset persistent chest pain that started yesterday. She reports associated SOB. Pt describes the chest pain as a pressure in them middle of her chest and lasting approximately 7-8 minutes. She states that sometimes she can feel the pain between her shoulder blades. Positive nausea. Daughter states that the pt has been complaining of back pain these past few days, which she attributes to kidney problems. She reports taking Lasix this morning. She is supposed to take 2 pills twice a day. Pt denies fever, neck pain, sore throat, visual disturbance, abdominal pain, diarrhea, urinary symptoms, HA, weakness, numbness and rash as associated symptoms.    Past Medical History  Diagnosis Date  . SYSTOLIC chf,EF 31-49%   . Atrial fibrillation   . Hypertension   . Bursitis   . GERD (gastroesophageal reflux disease)   . Cancer   . Cataract   . Gallstones   . Kidney stones   . Sleep apnea   . CPAP/BiPAP dependent     at night   . Morbid obesity   . Chronic cellulitis   . Degenerative joint disease   . Gout   . Diverticulitis   . Blood transfusion   . Anemia   . Sinoatrial node dysfunction    Past Surgical History  Procedure Laterality Date  . Abdominal hysterectomy  1991  . Pacemaker insertion  Nov 2000  . Eye surgery    . Percutaneous nephrolithotomy  April 2012   History reviewed. No pertinent family history. History  Substance Use  Topics  . Smoking status: Former Smoker -- 1.00 packs/day    Types: Cigarettes    Quit date: 09/26/1979  . Smokeless tobacco: Never Used  . Alcohol Use: No   OB History    No data available     Review of Systems  Constitutional: Negative for appetite change and fatigue.  HENT: Negative for congestion, ear discharge and sinus pressure.   Eyes: Negative for discharge.  Respiratory: Positive for cough and shortness of breath.   Cardiovascular: Positive for chest pain.  Gastrointestinal: Positive for nausea. Negative for vomiting, abdominal pain and diarrhea.  Genitourinary: Negative for dysuria, frequency and hematuria.  Musculoskeletal: Positive for back pain.  Neurological: Negative for seizures and headaches.  Psychiatric/Behavioral: Negative for hallucinations.   Allergies  Beta adrenergic blockers; Ciprofloxacin; Codeine; Contrast media; Latex; Penicillins; Peppermint oil; Prednisone; and Tape  Home Medications   Prior to Admission medications   Medication Sig Start Date End Date Taking? Authorizing Provider  allopurinol (ZYLOPRIM) 300 MG tablet Take 300 mg by mouth daily.      Historical Provider, MD  aspirin 325 MG tablet Take 325 mg by mouth daily.      Historical Provider, MD  B Complex Vitamins (B-COMPLEX/B-12 PO) Take by mouth daily.    Historical Provider, MD  calcium gluconate 500 MG tablet Take 500 mg by mouth daily.    Historical Provider,  MD  COD LIVER OIL PO Take 1 tablet by mouth daily.      Historical Provider, MD  colchicine 0.6 MG tablet Take 0.6 mg by mouth daily.     Historical Provider, MD  colchicine-probenecid 0.5-500 MG per tablet Take 1 tablet by mouth 2 (two) times daily as needed. Gout flare up 02/24/13   Historical Provider, MD  furosemide (LASIX) 40 MG tablet Take 1 tablet (40 mg total) by mouth 2 (two) times daily. 04/14/14   Fay Records, MD  HYDROcodone-acetaminophen (NORCO) 10-325 MG per tablet Take 1 tablet by mouth 3 (three) times daily.       Historical Provider, MD  Multiple Vitamin (MULTIVITAMIN) tablet Take 1 tablet by mouth daily.    Historical Provider, MD  NON FORMULARY bpap As directed    Historical Provider, MD  omeprazole (PRILOSEC) 20 MG capsule Take 20 mg by mouth daily.      Historical Provider, MD  potassium chloride SA (K-DUR,KLOR-CON) 20 MEQ tablet Take 1 tablet (20 mEq total) by mouth 2 (two) times daily. 04/14/14   Fay Records, MD   BP 149/80 mmHg  Pulse 90  Temp(Src) 98.2 F (36.8 C) (Oral)  Resp 24  Ht 5' 6"  (1.676 m)  Wt 336 lb (152.409 kg)  BMI 54.26 kg/m2  SpO2 97%  Physical Exam  Constitutional: She is oriented to person, place, and time. She appears well-developed.  HENT:  Head: Normocephalic.  Eyes: Conjunctivae and EOM are normal. No scleral icterus.  Neck: Neck supple. No thyromegaly present.  Cardiovascular: Normal rate and regular rhythm.  Exam reveals no gallop and no friction rub.   No murmur heard. Mildly tachycardic.   Pulmonary/Chest: No stridor. She has no wheezes. She has no rales. She exhibits no tenderness.  Decreased breath sound at both bases.   Abdominal: She exhibits no distension. There is no tenderness. There is no rebound.  Musculoskeletal: Normal range of motion. She exhibits no edema.  Lymphadenopathy:    She has no cervical adenopathy.  Severe lymphedema.   Neurological: She is oriented to person, place, and time. She exhibits normal muscle tone. Coordination normal.  Skin: No rash noted. No erythema.  Psychiatric: She has a normal mood and affect. Her behavior is normal.    ED Course  Procedures (including critical care time)  DIAGNOSTIC STUDIES: Oxygen Saturation is 97% on RA, normal by my interpretation.    COORDINATION OF CARE: 3:47 PM- Pt advised of plan for treatment and pt agrees.  Labs Review Labs Reviewed  CBC WITH DIFFERENTIAL/PLATELET - Abnormal; Notable for the following:    RBC 3.82 (*)    Hemoglobin 11.8 (*)    All other components within  normal limits  BASIC METABOLIC PANEL  TROPONIN I    Imaging Review Dg Chest 2 View  10/30/2014   CLINICAL DATA:  Shortness of breath for 2-3 days.  EXAM: CHEST  2 VIEW  COMPARISON:  03/06/2013  FINDINGS: Bilateral mild interstitial thickening. No pleural effusion or pneumothorax. No focal consolidation. Stable cardiomegaly. Single lead cardiac pacer.  IMPRESSION: Mild interstitial edema and stable cardiomegaly.   Electronically Signed   By: Kathreen Devoid   On: 10/30/2014 13:02     EKG Interpretation None       EKG:  Rhythm: v-paced Rate: 91 ST segments: NS ST changes   MDM   Final diagnoses:  CHF exacerbation  Chest pain, unspecified chest pain type    68yF chronic atrial fibrillation, morbid obesity, complete heart  block after AV node ablation, status post permanent pacemaker insertion, and diastolic CHF with intermittent chest pressure since yesterday and worsening dyspnea. Intermittent compliance with medications. Does not weigh self regularly. EKG v-paced. CXR showing mild interstitial edema. Lasix. Currently CP free but intermittent chest pressure since yesterday considering. Says had aspirin this morning. Will discuss with medicine for possible admission for r/o and continued diuresis.   I personally preformed the services scribed in my presence. The recorded information has been reviewed is accurate. Virgel Manifold, MD.    Virgel Manifold, MD 10/30/14 [phone]

## 2014-10-30 NOTE — Telephone Encounter (Signed)
F/u   Pt's daughter called and stated she is taking her mom to [facility] ER.

## 2014-10-30 NOTE — Telephone Encounter (Signed)
Pt c/o swelling: STAT is pt has developed SOB within 24 hours  1. How long have you been experiencing swelling? A little over 2 weeks// Chronic Kidney Disease  2. Where is the swelling located? Mostly feet and legs   3.  Are you currently taking a "fluid pill"?yes  4.  Are you currently SOB? Slightly not really bad   5.  Have you traveled recently?yes to [place]; Pt req a call back to discuss what she should do, increase a certain med, go to the hospital or receive a sooner appt. Pel[place] call

## 2014-10-30 NOTE — H&P (Addendum)
History and Physical  [name] [name] OVF:[id number] DOB: 09-25-1946 DOA: 10/30/2014  Referring physician: Dr Wilson Singer, ED physician PCP: [name], MD   Chief Complaint: Chest pain, shortness of breath  HPI: [name] [name] is a 69 y.o. female  With a history of atrial fibrillation, hypertension, GERD, sleep apnea with C Pap, systolic heart failure with an EF of 40% on 05/08/2011. She presents to the hospital with 2 week history of worsening shortness of breath or been worse over the past 2 days. In addition to her shortness of breath, she's been having intermittent substernal chest pressure.  Activity makes her shortness of breath worse and she is dyspneic to proximately 20 feet. At rest, her shortness of breath improves. She does not check her weights daily. No other palliating or provoking factors.  Review of Systems:   Pt complains of worsening peripheral edema particularly in her legs bilaterally.  Pt denies any fevers, chills, nausea, vomiting, palpitations, headache, vision changes, abdominal pain, diarrhea, constipation.  Review of systems are otherwise negative  Past Medical History  Diagnosis Date  . SYSTOLIC chf,EF 84-16%   . Atrial fibrillation   . Hypertension   . Bursitis   . GERD (gastroesophageal reflux disease)   . Cancer   . Cataract   . Gallstones   . Kidney stones   . Sleep apnea   . CPAP/BiPAP dependent     at night   . Morbid obesity   . Chronic cellulitis   . Degenerative joint disease   . Gout   . Diverticulitis   . Blood transfusion   . Anemia   . Sinoatrial node dysfunction    Past Surgical History  Procedure Laterality Date  . Abdominal hysterectomy  1991  . Pacemaker insertion  Nov 2000  . Eye surgery    . Percutaneous nephrolithotomy  April 2012   Social History:  reports that she quit smoking about 35 years ago. Her smoking use included Cigarettes. She smoked 1.00 pack per day. She has never used smokeless tobacco. She reports that she  does not drink alcohol or use illicit drugs. Patient lives at home & is able to participate in activities of daily living   Allergies  Allergen Reactions  . Peppermint Oil Shortness Of Breath  . Beta Adrenergic Blockers     REACTION: bradycardia  . Ciprofloxacin     REACTION: hives  . Codeine     REACTION: throat swelling  . Contrast Media [Iodinated Diagnostic Agents] Other (See Comments)    Patient states "I have chronic kidney disease so the doctor said no dye in my veins."  . Latex     REACTION: rash, itching  . Penicillins     REACTION: hives  . Prednisone     REACTION: swelling, S.O.B.  . Tape Dermatitis    States all blister up her skin    History reviewed. No pertinent family history.    Prior to Admission medications   Medication Sig Start Date End Date Taking? Authorizing Provider  allopurinol (ZYLOPRIM) 300 MG tablet Take 300 mg by mouth daily.     Yes Historical Provider, MD  aspirin 325 MG tablet Take 325 mg by mouth daily.     Yes Historical Provider, MD  B Complex Vitamins (B-COMPLEX/B-12 PO) Take by mouth daily.   Yes Historical Provider, MD  calcitRIOL (ROCALTROL) 0.25 MCG capsule Take 0.25 mcg by mouth daily.   Yes Historical Provider, MD  Calcium Carbonate (CALCIUM 600 PO) Take 1 tablet by  mouth daily.   Yes Historical Provider, MD  COD LIVER OIL PO Take 1 tablet by mouth daily.     Yes Historical Provider, MD  colchicine-probenecid 0.5-500 MG per tablet Take 1 tablet by mouth 2 (two) times daily. Gout flare up 02/24/13  Yes Historical Provider, MD  docusate sodium (COLACE) 100 MG capsule Take 100 mg by mouth daily as needed for mild constipation.   Yes Historical Provider, MD  ferrous sulfate 325 (65 FE) MG tablet Take 325 mg by mouth daily with breakfast.   Yes Historical Provider, MD  furosemide (LASIX) 40 MG tablet Take 1 tablet (40 mg total) by mouth 2 (two) times daily. 04/14/14  Yes Fay Records, MD  HYDROcodone-acetaminophen (NORCO) 10-325 MG per tablet  Take 1 tablet by mouth 3 (three) times daily.     Yes Historical Provider, MD  Multiple Vitamin (MULTIVITAMIN) tablet Take 1 tablet by mouth daily.   Yes Historical Provider, MD  omeprazole (PRILOSEC) 20 MG capsule Take 20 mg by mouth daily.     Yes Historical Provider, MD  potassium chloride SA (K-DUR,KLOR-CON) 20 MEQ tablet Take 1 tablet (20 mEq total) by mouth 2 (two) times daily. 04/14/14  Yes Fay Records, MD    Physical Exam: BP 139/72 mmHg  Pulse 97  Temp(Src) 97.5 F (36.4 C) (Oral)  Resp 26  Ht 5' 6"  (1.676 m)  Wt 152.409 kg (336 lb)  BMI 54.26 kg/m2  SpO2 95%  General: Elderly Caucasian female. Awake and alert and oriented x3. No acute cardiopulmonary distress.  Eyes: Pupils equal, round, reactive to light. Extraocular muscles are intact. Sclerae anicteric and noninjected.  ENT: Moist mucosal membranes. No mucosal lesions.   Neck: Neck supple without lymphadenopathy. No carotid bruits. No masses palpated.  Cardiovascular: Regular rate with normal S1-S2 sounds. No murmurs, rubs, gallops auscultated. JVD to 7 cm.  Respiratory: Good respiratory effort with no wheezes.  Bilateral rails Abdomen: Obese. Soft, nontender, nondistended. Active bowel sounds. No masses or hepatosplenomegaly  Skin: Dry, warm to touch. 2+ dorsalis pedis and radial pulses. 2+ nonpitting edema to the knee with chronic venous stasis changes Musculoskeletal: No calf or leg pain. All major joints not erythematous nontender.  Psychiatric: Intact judgment and insight.  Neurologic: No focal neurological deficits. Cranial nerves II through XII are grossly intact.           Labs on Admission:  Basic Metabolic Panel:  Recent Labs Lab 10/30/14 1329  NA 142  K 3.9  CL 103  CO2 28  GLUCOSE 145*  BUN 24*  CREATININE 1.56*  CALCIUM 9.6   Liver Function Tests: No results for input(s): AST, ALT, ALKPHOS, BILITOT, PROT, ALBUMIN in the last 168 hours. No results for input(s): LIPASE, AMYLASE in the last 168  hours. No results for input(s): AMMONIA in the last 168 hours. CBC:  Recent Labs Lab 10/30/14 1329  WBC 5.0  NEUTROABS 3.9  HGB 11.8*  HCT 36.6  MCV 95.8  PLT 150   Cardiac Enzymes:  Recent Labs Lab 10/30/14 1329  TROPONINI <0.03    BNP (last 3 results)  Recent Labs  10/30/14 1329  BNP 214.0*    ProBNP (last 3 results) No results for input(s): PROBNP in the last 8760 hours.  CBG: No results for input(s): GLUCAP in the last 168 hours.  Radiological Exams on Admission: Dg Chest 2 View  10/30/2014   CLINICAL DATA:  Shortness of breath for 2-3 days.  EXAM: CHEST  2 VIEW  COMPARISON:  03/06/2013  FINDINGS: Bilateral mild interstitial thickening. No pleural effusion or pneumothorax. No focal consolidation. Stable cardiomegaly. Single lead cardiac pacer.  IMPRESSION: Mild interstitial edema and stable cardiomegaly.   Electronically Signed   By: Kathreen Devoid   On: 10/30/2014 13:02    EKG: Independently reviewed. Not obtained in the ED. Will obtain an floor  Assessment/Plan Present on Admission:  . Chest pain . Acute on chronic systolic congestive heart failure  #1 acute on chronic systolic heart failure #2 chest pain #3 hypertension #4 chronic A. fib patient declines chronic anticoagulation #5 stage III chronic renal failure  Admit for observation Diuresis Strict I's and O's Daily weights Troponins throughout the night Echocardiogram in the morning  DVT prophylaxis: Lovenox  Consultants: None  Code Status: Full code  Family Communication: Daughter in the room   Disposition Plan: Home following improvement  Time spent: 50 minutes was spent with face-to-face time with patient with at least 50% with counseling and coordination of care  Truett Mainland, DO [facility] Pager [phone]

## 2014-10-30 NOTE — ED Notes (Signed)
Bedside commode remains at bedside.

## 2014-10-30 NOTE — Progress Notes (Signed)
Patient wears home BiPAP 19/13 home unit.

## 2014-10-31 DIAGNOSIS — I5023 Acute on chronic systolic (congestive) heart failure: Secondary | ICD-10-CM | POA: Diagnosis not present

## 2014-10-31 DIAGNOSIS — I5022 Chronic systolic (congestive) heart failure: Secondary | ICD-10-CM | POA: Diagnosis not present

## 2014-10-31 DIAGNOSIS — I509 Heart failure, unspecified: Secondary | ICD-10-CM

## 2014-10-31 LAB — BASIC METABOLIC PANEL
Anion gap: 8 (ref 5–15)
BUN: 23 mg/dL (ref 6–23)
CHLORIDE: 102 mmol/L (ref 96–112)
CO2: 29 mmol/L (ref 19–32)
CREATININE: 1.44 mg/dL — AB (ref 0.50–1.10)
Calcium: 9.2 mg/dL (ref 8.4–10.5)
GFR, EST AFRICAN AMERICAN: 42 mL/min — AB (ref >90)
GFR, EST NON AFRICAN AMERICAN: 36 mL/min — AB (ref >90)
Glucose, Bld: 114 mg/dL — ABNORMAL HIGH (ref 70–99)
POTASSIUM: 3.7 mmol/L (ref 3.5–5.1)
Sodium: 139 mmol/L (ref 135–145)

## 2014-10-31 LAB — TROPONIN I
TROPONIN I: 0.03 ng/mL (ref <0.031)
Troponin I: <0.03 ng/mL (ref <0.031)

## 2014-10-31 MED ORDER — COLCHICINE 0.6 MG PO TABS
0.6000 mg | ORAL_TABLET | Freq: Two times a day (BID) | ORAL | Status: DC
  Administered 2014-10-31 – 2014-11-02 (×5): 0.6 mg via ORAL
  Filled 2014-10-31 (×5): qty 1

## 2014-10-31 MED ORDER — PROBENECID 500 MG PO TABS
500.0000 mg | ORAL_TABLET | Freq: Two times a day (BID) | ORAL | Status: DC
  Administered 2014-10-31 – 2014-11-02 (×4): 500 mg via ORAL
  Filled 2014-10-31 (×9): qty 1

## 2014-10-31 NOTE — Progress Notes (Signed)
[facility] PROGRESS NOTE  [name] [name] [id number]:[id number] DOB: November 12, 1945 DOA: 10/30/2014 PCP: [name] Amel, MD Interim summary: [name] [name] is a 69 y.o. female admitted for sob.  Assessment/Plan: 1. Acute CHF:  Admitted to tele and started on IV lasix. Intake and output. Echocardiogram ordered and pending.   Currently denies any chest pain or sob.   Chronic atrial fibrillation not on anticoagulation. Rate controlled.   Stage 3 CKD: At baseline.   Code Status: fullcode Family Communication: pastor at bedside Disposition Plan: pending, refusing PT eval. Possibly home when appopriately diuresed.    Consultants:  none  Procedures:  Echocardiogram.   Antibiotics:  none  HPI/Subjective: In good spirits , no chest pain or sob.   Objective: Filed Vitals:   10/31/14 0657  BP: 155/82  Pulse: 97  Temp: 97.5 F (36.4 C)  Resp: 21    Intake/Output Summary (Last 24 hours) at 10/31/14 1302 Last data filed at 10/31/14 1200  Gross per 24 hour  Intake      0 ml  Output   1350 ml  Net  -1350 ml   Filed Weights   10/30/14 1224 10/30/14 1900  Weight: 152.409 kg (336 lb) 144.607 kg (318 lb 12.8 oz)    Exam:   General:  Alert afebrile comfortable  Cardiovascular: s1s2  Respiratory: diminished air entry at bases,  Abdomen: soft nonteder non distended bowel sounds heard  Musculoskeletal: pedal edema. 3+  Data Reviewed: Basic Metabolic Panel:  Recent Labs Lab 10/30/14 1329 10/31/14 0644  NA 142 139  K 3.9 3.7  CL 103 102  CO2 28 29  GLUCOSE 145* 114*  BUN 24* 23  CREATININE 1.56* 1.44*  CALCIUM 9.6 9.2   Liver Function Tests: No results for input(s): AST, ALT, ALKPHOS, BILITOT, PROT, ALBUMIN in the last 168 hours. No results for input(s): LIPASE, AMYLASE in the last 168 hours. No results for input(s): AMMONIA in the last 168 hours. CBC:  Recent Labs Lab 10/30/14 1329  WBC 5.0  NEUTROABS 3.9  HGB 11.8*  HCT 36.6  MCV 95.8  PLT  150   Cardiac Enzymes:  Recent Labs Lab 10/30/14 1329 10/30/14 1950 10/31/14 0036 10/31/14 0644  TROPONINI <0.03 <0.03 0.03 <0.03   BNP (last 3 results)  Recent Labs  10/30/14 1329  BNP 214.0*    ProBNP (last 3 results) No results for input(s): PROBNP in the last 8760 hours.  CBG: No results for input(s): GLUCAP in the last 168 hours.  No results found for this or any previous visit (from the past 240 hour(s)).   Studies: Dg Chest 2 View  10/30/2014   CLINICAL DATA:  Shortness of breath for 2-3 days.  EXAM: CHEST  2 VIEW  COMPARISON:  03/06/2013  FINDINGS: Bilateral mild interstitial thickening. No pleural effusion or pneumothorax. No focal consolidation. Stable cardiomegaly. Single lead cardiac pacer.  IMPRESSION: Mild interstitial edema and stable cardiomegaly.   Electronically Signed   By: [name] [name]   On: 10/30/2014 13:02    Scheduled Meds: . allopurinol  300 mg Oral Daily  . aspirin  325 mg Oral Daily  . calcitRIOL  0.25 mcg Oral Daily  . calcium carbonate  1,250 mg Oral Daily  . colchicine  0.6 mg Oral BID   And  . probenecid  500 mg Oral BID  . enoxaparin (LOVENOX) injection  40 mg Subcutaneous Q24H  . furosemide  40 mg Intravenous BID  . pantoprazole  40 mg Oral Daily  . potassium chloride SA  20 mEq Oral BID  . sodium chloride  3 mL Intravenous Q12H   Continuous Infusions:   Active Problems:   Acute on chronic systolic congestive heart failure   Chest pain    Time spent: 20 minutes.     [facility] Pager [phone] If 7PM-7AM, please contact night-coverage at [email or web address], password [facility] 10/31/2014, 1:02 PM  LOS: 1 day

## 2014-10-31 NOTE — Progress Notes (Signed)
  Echocardiogram 2D Echocardiogram has been performed.  Samuel Germany 10/31/2014, 1:39 PM

## 2014-10-31 NOTE — Progress Notes (Signed)
Checked Patients BiPAP 19/13 ,filled with water by nurse.

## 2014-10-31 NOTE — Progress Notes (Signed)
INITIAL NUTRITION ASSESSMENT  DOCUMENTATION CODES Per approved criteria  -Morbid Obesity   INTERVENTION: No nutrition intervention warranted at this time. Monitor intake  NUTRITION DIAGNOSIS: Morbid obesity related to an unknown etiology as evidenced by BMI > 50  Goal: Pt to meet >/= 90% of their estimated nutrition needs   Monitor:  Oral intake, labs, weights, gi distress  Reason for Assessment: MST of 2  69 y.o. female  Admitting Dx: <principal problem not specified>  ASSESSMENT: 69 y.o. female With a history of atrial fibrillation, hypertension, GERD, sleep apnea with C Pap, systolic heart failure  presents to the hospital with 2 week history of worsening shortness of breath  Pt states no history of poor oral intake on MST  Pt denies Gi distress: no n/v/d/c   Height: Ht Readings from Last 1 Encounters:  10/30/14 5' 6"  (1.676 m)    Weight: Wt Readings from Last 1 Encounters:  10/30/14 318 lb 12.8 oz (144.607 kg)    Ideal Body Weight: 130 lbs  % Ideal Body Weight: 245%  Wt Readings from Last 10 Encounters:  10/30/14 318 lb 12.8 oz (144.607 kg)  08/27/14 325 lb (147.419 kg)  04/13/14 326 lb (147.873 kg)  11/28/13 325 lb (147.419 kg)  09/01/13 325 lb (147.419 kg)  05/30/13 321 lb 12 oz (145.945 kg)  04/07/13 310 lb (140.615 kg)  03/17/13 313 lb 1.3 oz (142.012 kg)  03/09/13 324 lb 11.8 oz (147.3 kg)  03/03/13 325 lb (147.419 kg)  ~7 lb weight loss  Usual Body Weight:~325  % Usual Body Weight: 98%  BMI:  Body mass index is 51.48 kg/(m^2).   Estimated Nutritional Needs: Kcal: 1450-1725 Protein: >71 g (1.2g x ibw) Fluid: >1.45 liters  Skin: WDL  Diet Order: Diet Heart  EDUCATION NEEDS: -Education not appropriate at this time   Intake/Output Summary (Last 24 hours) at 10/31/14 1331 Last data filed at 10/31/14 1200  Gross per 24 hour  Intake      0 ml  Output   1350 ml  Net  -1350 ml    Last BM: Unknown   Labs:   Recent Labs Lab  10/30/14 1329 10/31/14 0644  NA 142 139  K 3.9 3.7  CL 103 102  CO2 28 29  BUN 24* 23  CREATININE 1.56* 1.44*  CALCIUM 9.6 9.2  GLUCOSE 145* 114*    CBG (last 3)  No results for input(s): GLUCAP in the last 72 hours.  Scheduled Meds: . allopurinol  300 mg Oral Daily  . aspirin  325 mg Oral Daily  . calcitRIOL  0.25 mcg Oral Daily  . calcium carbonate  1,250 mg Oral Daily  . colchicine  0.6 mg Oral BID   And  . probenecid  500 mg Oral BID  . enoxaparin (LOVENOX) injection  40 mg Subcutaneous Q24H  . furosemide  40 mg Intravenous BID  . pantoprazole  40 mg Oral Daily  . potassium chloride SA  20 mEq Oral BID  . sodium chloride  3 mL Intravenous Q12H    Continuous Infusions:   Past Medical History  Diagnosis Date  . SYSTOLIC chf,EF 84-16%   . Atrial fibrillation   . Hypertension   . Bursitis   . GERD (gastroesophageal reflux disease)   . Cancer   . Cataract   . Gallstones   . Kidney stones   . Sleep apnea   . CPAP/BiPAP dependent     at night   . Morbid obesity   . Chronic  cellulitis   . Degenerative joint disease   . Gout   . Diverticulitis   . Blood transfusion   . Anemia   . Sinoatrial node dysfunction     Past Surgical History  Procedure Laterality Date  . Abdominal hysterectomy  1991  . Pacemaker insertion  Nov 2000  . Eye surgery    . Percutaneous nephrolithotomy  April 2012   Burtis Junes RD, [place] [facility] Pager: [phone] 10/31/2014 1:31 PM

## 2014-11-01 DIAGNOSIS — R0789 Other chest pain: Secondary | ICD-10-CM | POA: Diagnosis not present

## 2014-11-01 DIAGNOSIS — I5023 Acute on chronic systolic (congestive) heart failure: Secondary | ICD-10-CM | POA: Diagnosis not present

## 2014-11-01 DIAGNOSIS — I5022 Chronic systolic (congestive) heart failure: Secondary | ICD-10-CM | POA: Diagnosis not present

## 2014-11-01 LAB — BASIC METABOLIC PANEL
Anion gap: 8 (ref 5–15)
BUN: 25 mg/dL — ABNORMAL HIGH (ref 6–23)
CO2: 29 mmol/L (ref 19–32)
Calcium: 9.1 mg/dL (ref 8.4–10.5)
Chloride: 102 mmol/L (ref 96–112)
Creatinine, Ser: 1.52 mg/dL — ABNORMAL HIGH (ref 0.50–1.10)
GFR calc Af Amer: 39 mL/min — ABNORMAL LOW (ref >90)
GFR calc non Af Amer: 34 mL/min — ABNORMAL LOW (ref >90)
Glucose, Bld: 127 mg/dL — ABNORMAL HIGH (ref 70–99)
POTASSIUM: 3.9 mmol/L (ref 3.5–5.1)
Sodium: 139 mmol/L (ref 135–145)

## 2014-11-01 LAB — TROPONIN I

## 2014-11-01 MED ORDER — CALCIUM CARBONATE ANTACID 500 MG PO CHEW
1.0000 | CHEWABLE_TABLET | Freq: Two times a day (BID) | ORAL | Status: DC
  Administered 2014-11-01 – 2014-11-02 (×3): 200 mg via ORAL
  Filled 2014-11-01 (×3): qty 1

## 2014-11-01 MED ORDER — FUROSEMIDE 40 MG PO TABS
40.0000 mg | ORAL_TABLET | Freq: Every day | ORAL | Status: DC
  Administered 2014-11-01 – 2014-11-02 (×2): 40 mg via ORAL
  Filled 2014-11-01 (×2): qty 1

## 2014-11-01 MED ORDER — GI COCKTAIL ~~LOC~~
30.0000 mL | Freq: Once | ORAL | Status: AC
Start: 2014-11-01 — End: 2014-11-01
  Administered 2014-11-01: 30 mL via ORAL
  Filled 2014-11-01: qty 30

## 2014-11-01 NOTE — Progress Notes (Signed)
Pt experiencing chest pain without radiation. Pt assessed and location of pain upper epigastric area. Pt's B/P 134/71 and pulse 97. Pt denies nausea or vomiting. Tele strip wnl and patient has refused pain medication. Patient rates pain as a 10 (0-10 pain scale). Pt is requesting an anti-indigestion medicine. Pt has received her daily dose of protonix. MD notified no further instructions at this time.

## 2014-11-01 NOTE — Progress Notes (Addendum)
[facility] PROGRESS NOTE  [name] [name] HQR:[id number] DOB: 1945/10/02 DOA: 10/30/2014 PCP: [name] Amel, MD Interim summary: [name] [name] is a 69 y.o. female admitted for sob. She was admitted for CHF exacerbation.  Assessment/Plan: 1. Acute on chronic systolic and diastolic CHF:  Admitted to tele and started on IV lasix. Intake and output. Echocardiogram ordered and showed LVEF of 25% to 30 %  With grade 3 diastolic dysfunction.   Currently denies any chest pain or sob.   Chronic atrial fibrillation not on anticoagulation. Rate controlled.   Stage 3 CKD: At baseline.   Code Status: fullcode Family Communication: none at bedside.  Disposition Plan: pending, refusing PT eval. Possibly home when appopriately diuresed.    Consultants:  none  Procedures:  Echocardiogram.   Antibiotics:  none  HPI/Subjective: Had one episode of epigastric pain, reports felt like indigestion. EKG unchanged. Troponin ordered. tums ordered.   Objective: Filed Vitals:   11/01/14 1520  BP: 143/71  Pulse: 96  Temp: 97.8 F (36.6 C)  Resp: 20    Intake/Output Summary (Last 24 hours) at 11/01/14 1621 Last data filed at 11/01/14 1211  Gross per 24 hour  Intake    243 ml  Output   1300 ml  Net  -1057 ml   Filed Weights   10/30/14 1224 10/30/14 1900 11/01/14 0100  Weight: 152.409 kg (336 lb) 144.607 kg (318 lb 12.8 oz) 137.2 kg (302 lb 7.5 oz)    Exam:   General:  Alert afebrile comfortable  Cardiovascular: s1s2  Respiratory: diminished air entry at bases,  Abdomen: soft nonteder non distended bowel sounds heard  Musculoskeletal: pedal edema. 3+, left greater than right.   Data Reviewed: Basic Metabolic Panel:  Recent Labs Lab 10/30/14 1329 10/31/14 0644 11/01/14 0652  NA 142 139 139  K 3.9 3.7 3.9  CL 103 102 102  CO2 28 29 29   GLUCOSE 145* 114* 127*  BUN 24* 23 25*  CREATININE 1.56* 1.44* 1.52*  CALCIUM 9.6 9.2 9.1   Liver Function Tests: No  results for input(s): AST, ALT, ALKPHOS, BILITOT, PROT, ALBUMIN in the last 168 hours. No results for input(s): LIPASE, AMYLASE in the last 168 hours. No results for input(s): AMMONIA in the last 168 hours. CBC:  Recent Labs Lab 10/30/14 1329  WBC 5.0  NEUTROABS 3.9  HGB 11.8*  HCT 36.6  MCV 95.8  PLT 150   Cardiac Enzymes:  Recent Labs Lab 10/30/14 1329 10/30/14 1950 10/31/14 0036 10/31/14 0644  TROPONINI <0.03 <0.03 0.03 <0.03   BNP (last 3 results)  Recent Labs  10/30/14 1329  BNP 214.0*    ProBNP (last 3 results) No results for input(s): PROBNP in the last 8760 hours.  CBG: No results for input(s): GLUCAP in the last 168 hours.  No results found for this or any previous visit (from the past 240 hour(s)).   Studies: No results found.  Scheduled Meds: . allopurinol  300 mg Oral Daily  . aspirin  325 mg Oral Daily  . calcitRIOL  0.25 mcg Oral Daily  . calcium carbonate  1 tablet Oral BID  . colchicine  0.6 mg Oral BID   And  . probenecid  500 mg Oral BID  . enoxaparin (LOVENOX) injection  40 mg Subcutaneous Q24H  . furosemide  40 mg Oral Daily  . pantoprazole  40 mg Oral Daily  . potassium chloride SA  20 mEq Oral BID  . sodium chloride  3 mL Intravenous Q12H   Continuous  Infusions:   Active Problems:   Acute on chronic systolic congestive heart failure   Chest pain    Time spent: 20 minutes.     [facility] Pager [phone] If 7PM-7AM, please contact night-coverage at [email or web address], password [facility] 11/01/2014, 4:21 PM  LOS: 2 days

## 2014-11-02 ENCOUNTER — Encounter (HOSPITAL_COMMUNITY): Payer: Self-pay | Admitting: Adult Health

## 2014-11-02 DIAGNOSIS — I10 Essential (primary) hypertension: Secondary | ICD-10-CM | POA: Diagnosis not present

## 2014-11-02 DIAGNOSIS — N183 Chronic kidney disease, stage 3 (moderate): Secondary | ICD-10-CM | POA: Diagnosis not present

## 2014-11-02 DIAGNOSIS — I5022 Chronic systolic (congestive) heart failure: Secondary | ICD-10-CM | POA: Diagnosis not present

## 2014-11-02 DIAGNOSIS — I5023 Acute on chronic systolic (congestive) heart failure: Secondary | ICD-10-CM | POA: Diagnosis not present

## 2014-11-02 LAB — BASIC METABOLIC PANEL
Anion gap: 9 (ref 5–15)
BUN: 24 mg/dL — ABNORMAL HIGH (ref 6–23)
CO2: 28 mmol/L (ref 19–32)
Calcium: 9.4 mg/dL (ref 8.4–10.5)
Chloride: 101 mmol/L (ref 96–112)
Creatinine, Ser: 1.5 mg/dL — ABNORMAL HIGH (ref 0.50–1.10)
GFR calc Af Amer: 40 mL/min — ABNORMAL LOW (ref >90)
GFR calc non Af Amer: 34 mL/min — ABNORMAL LOW (ref >90)
Glucose, Bld: 122 mg/dL — ABNORMAL HIGH (ref 70–99)
Potassium: 4.2 mmol/L (ref 3.5–5.1)
Sodium: 138 mmol/L (ref 135–145)

## 2014-11-02 MED ORDER — CARVEDILOL 3.125 MG PO TABS: 3.1250 mg | ORAL_TABLET | Freq: Two times a day (BID) | ORAL | Status: DC

## 2014-11-02 MED ORDER — FUROSEMIDE 20 MG PO TABS: 60.0000 mg | ORAL_TABLET | Freq: Two times a day (BID) | ORAL | Status: DC

## 2014-11-02 MED ORDER — FUROSEMIDE 40 MG PO TABS: 60.0000 mg | ORAL_TABLET | Freq: Two times a day (BID) | ORAL | Status: DC

## 2014-11-02 NOTE — Discharge Summary (Signed)
Physician Discharge Summary  [name] [name]:[id number] DOB: Jan 10, 1946 DOA: 10/30/2014  PCP: [name] Amel, MD  Admit date: 10/30/2014 Discharge date: 11/02/2014  Time spent: 30 minutes  Recommendations for Outpatient Follow-up:  1. Follow up with PCP in 2 weeks.  2. Follow up cardiologist as recommended.   Discharge Diagnoses:  Active Problems:   Acute on chronic systolic congestive heart failure   Chest pain chronic atrial fibrillation.  Discharge Condition: improved.   Diet recommendation: low sodium diet   Filed Weights   10/30/14 1900 11/01/14 0100 11/02/14 0628  Weight: 144.607 kg (318 lb 12.8 oz) 137.2 kg (302 lb 7.5 oz) 136.4 kg (300 lb 11.3 oz)    History of present illness:  [name] [name] is a 69 y.o. female admitted for sob. She was admitted for CHF exacerbation.   Hospital Course:  1. Acute on chronic systolic and diastolic CHF: Admitted to tele and started on IV lasix. Intake and output. Echocardiogram ordered and showed LVEF of 25% to 30 % With grade 3 diastolic dysfunction.   Currently denies any chest pain or sob. IV lasix transitioned to po lasix 60 mg BID.   Chronic atrial fibrillation not on anticoagulation. Rate controlled.   Stage 3 CKD: At baseline.  Procedures:  Echocardiogram.   Consultations:  Cardiologist.   Discharge Exam: Filed Vitals:   11/02/14 0628  BP: 145/85  Pulse: 95  Temp: 97.4 F (36.3 C)  Resp: 20    General: alert afebrile comfortable Cardiovascular:s1s2 Respiratory: ctab  Discharge Instructions   Discharge Instructions    (HEART FAILURE PATIENTS) Call MD:  Anytime you have any of the following symptoms: 1) 3 pound weight gain in 24 hours or 5 pounds in 1 week 2) shortness of breath, with or without a dry hacking cough 3) swelling in the hands, feet or stomach 4) if you have to sleep on extra pillows at night in order to breathe.    Complete by:  As directed      Diet - low sodium heart healthy     Complete by:  As directed      Discharge instructions    Complete by:  As directed   Follow up with cardiology as recommended. Please check BMP in one week.          Current Discharge Medication List    START taking these medications   Details  carvedilol (COREG) 3.125 MG tablet Take 1 tablet (3.125 mg total) by mouth 2 (two) times daily with a meal. Qty: 60 tablet, Refills: 0      CONTINUE these medications which have CHANGED   Details  furosemide (LASIX) 20 MG tablet Take 3 tablets (60 mg total) by mouth 2 (two) times daily. Qty: 60 tablet, Refills: 1      CONTINUE these medications which have NOT CHANGED   Details  allopurinol (ZYLOPRIM) 300 MG tablet Take 300 mg by mouth daily.      aspirin 325 MG tablet Take 325 mg by mouth daily.      B Complex Vitamins (B-COMPLEX/B-12 PO) Take by mouth daily.    calcitRIOL (ROCALTROL) 0.25 MCG capsule Take 0.25 mcg by mouth daily.    Calcium Carbonate (CALCIUM 600 PO) Take 1 tablet by mouth daily.    COD LIVER OIL PO Take 1 tablet by mouth daily.      colchicine-probenecid 0.5-500 MG per tablet Take 1 tablet by mouth 2 (two) times daily. Gout flare up    docusate sodium (COLACE) 100 MG  capsule Take 100 mg by mouth daily as needed for mild constipation.    ferrous sulfate 325 (65 FE) MG tablet Take 325 mg by mouth daily with breakfast.    HYDROcodone-acetaminophen (NORCO) 10-325 MG per tablet Take 1 tablet by mouth 3 (three) times daily.      Multiple Vitamin (MULTIVITAMIN) tablet Take 1 tablet by mouth daily.    omeprazole (PRILOSEC) 20 MG capsule Take 20 mg by mouth daily.      potassium chloride SA (K-DUR,KLOR-CON) 20 MEQ tablet Take 1 tablet (20 mEq total) by mouth 2 (two) times daily. Qty: 180 tablet, Refills: 3       Allergies  Allergen Reactions  . Peppermint Oil Shortness Of Breath  . Ciprofloxacin     REACTION: hives  . Codeine     REACTION: throat swelling  . Contrast Media [Iodinated Diagnostic Agents]  Other (See Comments)    Patient states "I have chronic kidney disease so the doctor said no dye in my veins."  . Latex     REACTION: rash, itching  . Penicillins     REACTION: hives  . Prednisone     REACTION: swelling, S.O.B.  . Tape Dermatitis    States all blister up her skin   Follow-up Information    Follow up with [name] Amel, MD. Schedule an appointment as soon as possible for a visit in 1 week.   Specialty:  Family Medicine   Why:  please check BMP   Contact information:   [address] [place] [address] [phone]        The results of significant diagnostics from this hospitalization (including imaging, microbiology, ancillary and laboratory) are listed below for reference.    Significant Diagnostic Studies: Dg Chest 2 View  10/30/2014   CLINICAL DATA:  Shortness of breath for 2-3 days.  EXAM: CHEST  2 VIEW  COMPARISON:  03/06/2013  FINDINGS: Bilateral mild interstitial thickening. No pleural effusion or pneumothorax. No focal consolidation. Stable cardiomegaly. Single lead cardiac pacer.  IMPRESSION: Mild interstitial edema and stable cardiomegaly.   Electronically Signed   By: Kathreen Devoid   On: 10/30/2014 13:02    Microbiology: No results found for this or any previous visit (from the past 240 hour(s)).   Labs: Basic Metabolic Panel:  Recent Labs Lab 10/30/14 1329 10/31/14 0644 11/01/14 0652 11/02/14 0643  NA 142 139 139 138  K 3.9 3.7 3.9 4.2  CL 103 102 102 101  CO2 28 29 29 28   GLUCOSE 145* 114* 127* 122*  BUN 24* 23 25* 24*  CREATININE 1.56* 1.44* 1.52* 1.50*  CALCIUM 9.6 9.2 9.1 9.4   Liver Function Tests: No results for input(s): AST, ALT, ALKPHOS, BILITOT, PROT, ALBUMIN in the last 168 hours. No results for input(s): LIPASE, AMYLASE in the last 168 hours. No results for input(s): AMMONIA in the last 168 hours. CBC:  Recent Labs Lab 10/30/14 1329  WBC 5.0  NEUTROABS 3.9  HGB 11.8*  HCT 36.6  MCV 95.8  PLT  150   Cardiac Enzymes:  Recent Labs Lab 10/30/14 1329 10/30/14 1950 10/31/14 0036 10/31/14 0644 11/01/14 1655  TROPONINI <0.03 <0.03 0.03 <0.03 <0.03   BNP: BNP (last 3 results)  Recent Labs  10/30/14 1329  BNP 214.0*    ProBNP (last 3 results) No results for input(s): PROBNP in the last 8760 hours.  CBG: No results for input(s): GLUCAP in the last 168 hours.     SignedHosie Poisson  [facility] 11/02/2014, 1:41 PM

## 2014-11-02 NOTE — Progress Notes (Signed)
Discharged home with instructions given on medications,and follow up visits,patient verbalized understanding. Prescriptions sent with patient. Accompanied by staff to an awaiting vehicle.

## 2014-11-02 NOTE — Telephone Encounter (Signed)
Late entry for 10/30/14 @1330  "noted"

## 2014-11-02 NOTE — Consult Note (Signed)
CARDIOLOGY CONSULT NOTE   Patient ID: [name] [name] MRN: [id number] DOB/AGE: 1946/04/26 69 y.o.  Admit Date: 10/30/2014 Referring Physician: Conley Rolls MD Primary Physician: [name], MD Consulting Cardiologist: Satira Sark MD Primary Cardiologist Dorris Carnes MD EP: Cristopher Peru MD Reason for Consultation: Chest Pain/dyspnea/edema  Clinical Summary [name] is a morbidly obese 69 y.o.female with history of hypertension, Afib (refuses to use anticoagulants,  Systolic CHF with EF 12%-75% per echo 10/30/2014.  She has PPM is the setting of AV node dysfunction, (St, Jude placed 07/1999, new PPM with revision in 2011) -other non-cardiac issues as outlined below, who presented with symptoms of dyspnea which had worsened over two weeks, especially with exertion, and LEE.  She states symptoms actually began last Oct when she saw PCP for chest cold. She states that her kidney fx is worsening causing her to be more susceptible to infections. She recovered from chest cold but worsening dyspnea and edema became a problem shortly thereafter.   Edema progressed over 2-3 weeks. She states that the day she was admitted (last Friday) she had chest pressure in the sternal area, radiating through to her back. She then had more dyspnea. Daughter brought her to ER, when Dr. Harrington Challenger could not see her in the office that day. 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 She is very sedentary due to arthritis and uses a rolling office chair in her apartment in order to move around. She is currently living in [place] with her daughter but wanted to have continuation of care by the doctors who know her.   In ER,  BP 149/80 HR 90, O2 Sat 97%. Hgb 11.8. Glucose 145,BUN 24, GFR 33. Pro-BNP negative. Troponin has been negative X 3. Pro-BNP 214. 0.  CXR with mild interstitial edema. EKG Paced rhythm. She  was treated with one dose of IV lasix 60 mg.  She has subsequently diuresed a total of 2.5 liters since admission with improvement in breathing status and edema.   Allergies  Allergen Reactions  . Peppermint Oil Shortness Of Breath  . Ciprofloxacin     REACTION: hives  . Codeine     REACTION: throat swelling  . Contrast Media [Iodinated Diagnostic Agents] Other (See Comments)    Patient states "I have chronic kidney disease so the doctor said no dye in my veins."  . Latex     REACTION: rash, itching  . Penicillins     REACTION: hives  . Prednisone     REACTION: swelling, S.O.B.  . Tape Dermatitis    States all blister up her skin    Medications Scheduled Medications: . allopurinol  300 mg Oral Daily  . aspirin  325 mg Oral Daily  . calcitRIOL  0.25 mcg Oral Daily  . calcium carbonate  1 tablet Oral BID  . carvedilol  3.125 mg Oral BID WC  . colchicine  0.6 mg Oral BID   And  . probenecid  500 mg Oral BID  . enoxaparin (LOVENOX) injection  40 mg Subcutaneous Q24H  . furosemide  40 mg Oral Daily  . pantoprazole  40 mg Oral Daily  . potassium chloride SA  20 mEq Oral BID  . sodium chloride  3 mL Intravenous Q12H    PRN Medications: sodium chloride, acetaminophen, cyclobenzaprine, docusate sodium, HYDROcodone-acetaminophen, morphine, ondansetron (ZOFRAN) IV, sodium chloride   Past Medical  History  Diagnosis Date  . Chronic systolic congestive heart failure     LVEF 25-30% with restrictive diastolic filling  . Atrial fibrillation   . Essential hypertension   . Bursitis   . GERD (gastroesophageal reflux disease)   . Cancer   . Cataract   . Gallstones   . Kidney stones   . Sleep apnea     CPAP  . Morbid obesity   . Chronic cellulitis   . Degenerative joint disease   . Gout   . Diverticulitis   . Anemia   . Sinoatrial node dysfunction     Status post PPM - Dr. Lovena Le    Past Surgical History  Procedure Laterality Date  . Abdominal hysterectomy  1991  .  Pacemaker insertion  Nov 2000    [place] with revision in 2011  . Eye surgery    . Percutaneous nephrolithotomy  April 2012    Family History  Problem Relation Age of Onset  . Heart attack Father   . Diabetes Sister   . Stroke Mother     Social History [name] [name] reports that she quit smoking about 35 years ago. Her smoking use included Cigarettes. She smoked 1.00 pack per day. She has never used smokeless tobacco. [name] reports that she does not drink alcohol.  Review of Systems Complete review of systems are found to be negative unless outlined in H&P above.   Physical Examination Blood pressure 145/85, pulse 95, temperature 97.4 F (36.3 C), temperature source Oral, resp. rate 20, height 5' 6"  (1.676 m), weight 300 lb 11.3 oz (136.4 kg), SpO2 97 %.  Intake/Output Summary (Last 24 hours) at 11/02/14 1250 Last data filed at 11/02/14 1100  Gross per 24 hour  Intake    600 ml  Output   1250 ml  Net   -650 ml    Telemetry: Paced rate of 100-104  GEN: No acute distress Neck: Supple, no elevated JVP or carotid bruits, no thyromegaly. Lungs: Clear to auscultation, nonlabored breathing at rest. Cardiac: Iregular rate and rhythm, 1/6 systolic murmur at the LSB (pulomonic)significant systolic murmur, no pericardial rub. Abdomen: Soft, nontender, no hepatomegaly, bowel sounds present, no guarding or rebound. Extremities: No pitting edema, distal pulses 1+. Venous stasis skin changes. . Skin: Warm and dry. Musculoskeletal: No kyphosis. Neuropsychiatric: Alert and oriented x3, affect grossly appropriate.  Prior Cardiac Testing/Procedures 1. Echocardiogram 10/31/2014 Left ventricle: The cavity size was normal. There was mild concentric hypertrophy. Systolic function was severely reduced. The estimated ejection fraction was in the range of 25% to 30%. Diffuse hypokinesis. Doppler parameters are consistent with a restrictive pattern, indicative of decreased left  ventricular diastolic compliance and/or increased left atrial pressure (grade 3 diastolic dysfunction). - Ventricular septum: Septal motion showed paradox. - Aortic valve: Trileaflet; mildly thickened, mildly calcified leaflets. Mean gradient (S): 11 mm Hg. Peak gradient (S): 20 mm Hg. Valve area (VTI): 1.43 cm^2. Valve area (Vmax): 1.41 cm^2. Valve area (Vmean): 1.36 cm^2. - Mitral valve: Calcified annulus. Mildly thickened leaflets . There was moderate regurgitation. - Left atrium: The atrium was severely dilated. - Right ventricle: Systolic function was normal. - Tricuspid valve: There was moderate regurgitation. - Pulmonary arteries: Systolic pressure was within the normal range. PA peak pressure: 35 mm Hg (S). - Pericardium, extracardiac: There was no pericardial effusion.  Cardiac Cath 05/2005 CONCLUSIONS: 1. Normal coronary angiography. 2. Mild left ventricular dysfunction of uncertain etiology possibly related  to asynchronous contractions on the pacemaker.  Lab Results  Basic Metabolic Panel:  Recent Labs Lab 10/30/14 1329 10/31/14 0644 11/01/14 0652 11/02/14 0643  NA 142 139 139 138  K 3.9 3.7 3.9 4.2  CL 103 102 102 101  CO2 28 29 29 28   GLUCOSE 145* 114* 127* 122*  BUN 24* 23 25* 24*  CREATININE 1.56* 1.44* 1.52* 1.50*  CALCIUM 9.6 9.2 9.1 9.4    CBC:  Recent Labs Lab 10/30/14 1329  WBC 5.0  NEUTROABS 3.9  HGB 11.8*  HCT 36.6  MCV 95.8  PLT 150    Cardiac Enzymes:  Recent Labs Lab 10/30/14 1329 10/30/14 1950 10/31/14 0036 10/31/14 0644 11/01/14 1655  TROPONINI <0.03 <0.03 0.03 <0.03 <0.03    Radiology: FINDINGS: Bilateral mild interstitial thickening. No pleural effusion or pneumothorax. No focal consolidation. Stable cardiomegaly. Single lead cardiac pacer.  IMPRESSION: Mild interstitial edema and stable cardiomegaly.   ECG: Paced rhythm.    Impression and Recommendations  1. NICM with EF of  25%-30%systolic CHF: Worsening symptoms over the last 2-3 weeks, but has had some problems with breathing after a chest cold in Oct which did not seem to get batter. She has diuresed 2.577 liters since admission with resolution of symptoms currently. She has not been OOB. Now transitioned to po lasix at 40 mg daily (BID at home). Recommend that she begin 60 mg BID on discharge.Marland Kitchen She wants to go home today and follow up with Dr.Ross. Uncertain of dry wt, last office visit with Dr. Lovena Le in Dec of 2015 wt was 335 lbs. In hospital she is 300 lbs, doubt accuracy for comparison.  2.Atrial fibrillation: Rate is controlled. CHADS VASC score of 4. She is not on AV nodal blocking agent at this time and refused to anticoagulation in the past. Telemetry has her HR at 104. Consider low dose coreg 3.125 mg BID.with reduced EF and tachycardia. OSA contributing.  3. Hypertension: Currently not well controlled with severe systolic dysfunction. Will start coreg 3.125 mg. No afterload reduction with ACE due to kidney dysfunction. Possibly use hydralazine. She is due to follow up with Dr. Harrington Challenger in 10 days. She will need to keep that appt with BMET.   4. St.  Jude PPM in situ: She has seen Dr.Taylor in December of 2015 with interrogation at that time, notes state normal function. Follow up with Dr. Lovena Le as planned.    Signed: Phill Myron. Lawrence NP [place]  11/02/2014, 12:50 PM Co-Sign MD   Attending note:  Patient seen and examined. Reviewed records and discussed the case with Ms. Lawrence NP. Ms. Blackson presents with acute on chronic systolic heart failure associated with volume overload. Symptoms have been present over the last several weeks, worse in the last 2 weeks by report. She has ruled out for ACS by cardiac enzymes. IV Lasix has produced diuresis of approximately 2.5 L, renal dysfunction remains stable with creatinine around 1.5. On examination she appears comfortable, not short of breath at rest. Lungs  exhibit decreased breath sounds throughout, elevated JVP noted, cardiac exam with RRR and indistinct PMI, legs show chronic edema and stasis. Weights are difficult to follow, she appears to still need further diuresis, although feels better and would like to go home. One option would be to transition her Lasix to 60 mg oral twice daily, higher than her outpatient dose, and have her keep follow-up with Dr. Harrington Challenger as scheduled in the next 10 days. We will also start Coreg 3.125 mg twice daily, hold off on ACE-I or ARB. Depending on blood  pressure, might consider adding hydralazine next for additional afterload reduction. She will need a BMET for her next office visit. Sodium and fluid restriction guidelines discussed.  Satira Sark, M.D., F.A.C.C.

## 2014-11-02 NOTE — Care Management Note (Signed)
    Page 1 of 1   11/02/2014     2:57:47 PM CARE MANAGEMENT NOTE 11/02/2014  Patient:  [name] [name], [name] [name]   Account Number:  [id number]  Date Initiated:  11/02/2014  Documentation initiated by:  Theophilus Kinds  Subjective/Objective Assessment:   Pt admitted from home with CP and CHF. Pt lives with family and will return home at discharge. Pt has cpap, walker, electric wheelchair, and shower bench.     Action/Plan:   Pt discharged home today. No CM needs noted.   Anticipated DC Date:  11/02/2014   Anticipated DC Plan:  [place]  CM consult      Choice offered to / List presented to:             Status of service:  Completed, signed off Medicare Important Message given?   (If response is "NO", the following Medicare IM given date fields will be blank) Date Medicare IM given:   Medicare IM given by:   Date Additional Medicare IM given:   Additional Medicare IM given by:    Discharge Disposition:  HOME/SELF CARE  Per UR Regulation:    If discussed at Long Length of Stay Meetings, dates discussed:    Comments:  11/02/14 [place], RN BSN CM

## 2014-11-11 NOTE — Progress Notes (Signed)
Cardiology Office Note   Date:  11/12/2014   ID:  [name], [name] 12-31-1945, MRN [id number]  PCP:  [name], MD  Cardiologist:   Dorris Carnes, MD   No chief complaint on file.  patinet comes in for follow up of SOB     History of Present Illness: [name] [name] is a 69 y.o. female with a history ofHTN, afib, chronic systolic CHF (LVEF 30 to 16%), morbid obesity Refuses anticoagulation. 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 I saw her last in July 2015  She was seen by Beckie Salts in the fall.  She was hosp earlier this month for SOB  Found to have exacerbation of CHF  Diuresed  Admitted to eating more salt  This was the first time in over a year she had been admitted  Echo in hosp LVEF was 20 to 25%  Since D/C  Breathing has been stable  Still does give out with exertion but denies SOB with rest.  NO chest pain          Current Outpatient Prescriptions  Medication Sig Dispense Refill  . allopurinol (ZYLOPRIM) 300 MG tablet Take 300 mg by mouth daily.      Marland Kitchen aspirin 325 MG tablet Take 325 mg by mouth daily.      . calcitRIOL (ROCALTROL) 0.25 MCG capsule Take 0.25 mcg by mouth daily.    . Calcium Carbonate (CALCIUM 600 PO) Take 1 tablet by mouth daily.    . COD LIVER OIL PO Take 1 tablet by mouth daily.      . colchicine-probenecid 0.5-500 MG per tablet Take 1 tablet by mouth 2 (two) times daily. Gout flare up    . Cyanocobalamin (VITAMIN B 12 PO) Place 1 tablet under the tongue every morning. Patient takes 500 mg by mouth daily    . docusate sodium (COLACE) 100 MG capsule Take 100 mg by mouth daily as needed for mild constipation.    . ferrous sulfate 325 (65 FE) MG tablet Take 325 mg by mouth daily with breakfast.    . furosemide (LASIX) 20 MG tablet Take 3 tablets (60 mg total) by mouth 2 (two) times daily. 60 tablet 1  . HYDROcodone-acetaminophen (NORCO) 10-325 MG per tablet Take 1 tablet by mouth 3 (three) times daily.      . Multiple Vitamin  (MULTIVITAMIN) tablet Take 1 tablet by mouth daily.    Marland Kitchen omeprazole (PRILOSEC) 20 MG capsule Take 20 mg by mouth daily.      . potassium chloride SA (K-DUR,KLOR-CON) 20 MEQ tablet Take 1 tablet (20 mEq total) by mouth 2 (two) times daily. 180 tablet 3   No current facility-administered medications for this visit.    Allergies:   Ciprofloxacin; Codeine; Contrast media; Coreg; Latex; Penicillins; Peppermint oil; Prednisone; and Tape   Past Medical History  Diagnosis Date  . Chronic systolic congestive heart failure     LVEF 25-30% with restrictive diastolic filling  . Atrial fibrillation   . Essential hypertension   . Bursitis   . GERD (gastroesophageal reflux disease)   . Cancer   . Cataract   . Gallstones   . Kidney stones   . Sleep apnea     CPAP  . Morbid obesity   . Chronic cellulitis   . Degenerative joint disease   . Gout   . Diverticulitis   . Anemia   . Sinoatrial node dysfunction     Status post PPM -  Dr. Lovena Le    Past Surgical History  Procedure Laterality Date  . Abdominal hysterectomy  1991  . Pacemaker insertion  Nov 2000    [place] with revision in 2011  . Eye surgery    . Percutaneous nephrolithotomy  April 2012     Social History:  The patient  reports that she quit smoking about 35 years ago. Her smoking use included Cigarettes. She smoked 1.00 pack per day. She has never used smokeless tobacco. She reports that she does not drink alcohol or use illicit drugs.   Family History:  The patient's family history includes Diabetes in her sister; Heart attack in her father; Stroke in her mother.    ROS:  Please see the history of present illness. All other systems are reviewed and  Negative to the above problem except as noted.    PHYSICAL EXAM: VS:  BP 130/80 mmHg  Pulse 90  Ht 5' 6"  (1.676 m)  Wt 306 lb 12.8 oz (139.164 kg)  BMI 49.54 kg/m2  GEN: Well nourished, well developed, in no acute distress HEENT: normal Neck: no JVD, carotid bruits, or  masses Cardiac: Irreg rate and rhythm  no murmurs, rubs, or gallops, 1-2+ edema  Respiratory:  clear to auscultation bilaterally, normal work of breathing GI: soft, nontender, nondistended, + BS  No hepatomegaly  MS: no deformity Moving all extremities   Skin: warm and dry, no rash Neuro:  Strength and sensation are intact Psych: euthymic mood, full affect   EKG:  EKG is ordered today  Ventricular paced.  ? afib     Lipid Panel    Component Value Date/Time   CHOL 144 04/13/2014 1405   TRIG 146 04/13/2014 1405   HDL 38* 04/13/2014 1405   CHOLHDL 3.8 04/13/2014 1405   VLDL 29 04/13/2014 1405   LDLCALC 77 04/13/2014 1405      Wt Readings from Last 3 Encounters:  11/12/14 306 lb 12.8 oz (139.164 kg)  11/02/14 300 lb 11.3 oz (136.4 kg)  08/27/14 325 lb (147.419 kg)      ASSESSMENT AND PLAN: 1  CHF  Volume status is fair  She is comfortable  Patient is no longer on ACE I  Somewhow taken off last summer Will start Hydralzine 12.5 tid and Imdur 30  She refuses b blocker  F/U in 4 ks.   Will check labs at that time     Current medicines are reviewed at length with the patient today.  The patient does not have concerns regarding medicines.  The following changes have been made: Hydralazine and imdur added   Labs/ tests ordered today include:  BMET on return   No orders of the defined types were placed in this encounter.     Disposition:   FU with  in   Signed, Dorris Carnes, MD  11/12/2014 3:57 PM    [address], [place], [place]  [address] Phone: [phone]; Fax: [phone]

## 2014-11-12 ENCOUNTER — Encounter: Payer: Self-pay | Admitting: Internal Medicine

## 2014-11-12 ENCOUNTER — Ambulatory Visit (INDEPENDENT_AMBULATORY_CARE_PROVIDER_SITE_OTHER): Payer: Medicare Other | Admitting: Internal Medicine

## 2014-11-12 VITALS — BP 130/80 | HR 90 | Ht 66.0 in | Wt 306.8 lb

## 2014-11-12 DIAGNOSIS — I5023 Acute on chronic systolic (congestive) heart failure: Secondary | ICD-10-CM | POA: Diagnosis not present

## 2014-11-12 DIAGNOSIS — I5021 Acute systolic (congestive) heart failure: Secondary | ICD-10-CM | POA: Diagnosis not present

## 2014-11-12 DIAGNOSIS — I10 Essential (primary) hypertension: Secondary | ICD-10-CM | POA: Diagnosis not present

## 2014-11-12 MED ORDER — HYDRALAZINE HCL 25 MG PO TABS: 12.5000 mg | ORAL_TABLET | Freq: Three times a day (TID) | ORAL | Status: DC

## 2014-11-12 MED ORDER — ISOSORBIDE MONONITRATE ER 30 MG PO TB24: 30.0000 mg | ORAL_TABLET | Freq: Every day | ORAL | Status: DC

## 2014-11-12 NOTE — Patient Instructions (Addendum)
Your physician has recommended you make the following change in your medication:   1.) START HYDRALAZINE 12.5 MG THREE TIMES DAILY 2.) START IMDUR 30 MG DAILY   Your physician recommends that you return for lab work in: April. (BMET, BNP)  Your physician recommends that you schedule a follow-up appointment IN 4-6 WEEKS WITH DR ROSS.

## 2014-12-14 ENCOUNTER — Ambulatory Visit (INDEPENDENT_AMBULATORY_CARE_PROVIDER_SITE_OTHER): Payer: Medicare Other | Admitting: Internal Medicine

## 2014-12-14 ENCOUNTER — Encounter: Payer: Self-pay | Admitting: Internal Medicine

## 2014-12-14 VITALS — BP 137/65 | HR 90 | Ht 66.0 in | Wt 310.0 lb

## 2014-12-14 DIAGNOSIS — E785 Hyperlipidemia, unspecified: Secondary | ICD-10-CM | POA: Diagnosis not present

## 2014-12-14 LAB — LIPID PANEL
CHOL/HDL RATIO: 3
Cholesterol: 142 mg/dL (ref 0–200)
HDL: 45.9 mg/dL (ref >39.00)
LDL CALC: 68 mg/dL (ref 0–99)
NonHDL: 96.1
Triglycerides: 141 mg/dL (ref 0.0–149.0)
VLDL: 28.2 mg/dL (ref 0.0–40.0)

## 2014-12-14 NOTE — Progress Notes (Signed)
Cardiology Office Note   Date:  12/14/2014   ID:  Raeana, Blinn 11/19/1945, MRN [id number]  PCP:  [name], MD  Cardiologist:   Dorris Carnes, MD   Chief Complaint  Patient presents with  . Appointment   Patient presents for continued cardiac care      History of Present Illness: [name] [name] is a 69 y.o. female with a history offHTN, afib, chronic systolic CHF (LVEF 20 to 62%), morbid obesity Refuses anticoagulation. 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  I saw in clinic a few wks ago   When I saw her last I added Hydralzine and Imdur  She had refused b blocker  Since seen, she has done OK  Breathing is stable  She is not that active     Current Outpatient Prescriptions  Medication Sig Dispense Refill  . allopurinol (ZYLOPRIM) 300 MG tablet Take 300 mg by mouth daily.      Marland Kitchen aspirin 325 MG tablet Take 325 mg by mouth daily.      . calcitRIOL (ROCALTROL) 0.25 MCG capsule Take 0.25 mcg by mouth daily.    . Calcium Carbonate (CALCIUM 600 PO) Take 1 tablet by mouth daily.    . COD LIVER OIL PO Take 1 tablet by mouth daily.      . colchicine-probenecid 0.5-500 MG per tablet Take 1 tablet by mouth 2 (two) times daily. Gout flare up    . Cyanocobalamin (VITAMIN B 12 PO) Place 1 tablet under the tongue every morning. Patient takes 500 mg by mouth daily    . docusate sodium (COLACE) 100 MG capsule Take 100 mg by mouth daily as needed for mild constipation.    . ferrous sulfate 325 (65 FE) MG tablet Take 325 mg by mouth daily with breakfast.    . furosemide (LASIX) 20 MG tablet Take 3 tablets (60 mg total) by mouth 2 (two) times daily. 60 tablet 1  . hydrALAZINE (APRESOLINE) 25 MG tablet Take 0.5 tablets (12.5 mg total) by mouth 3 (three) times daily. 45 tablet 11  . isosorbide mononitrate (IMDUR) 30 MG 24 hr tablet Take 1 tablet (30 mg total) by mouth daily. 30 tablet 11  . Multiple Vitamin (MULTIVITAMIN) tablet Take 1 tablet by mouth daily.    Marland Kitchen  omeprazole (PRILOSEC) 20 MG capsule Take 20 mg by mouth daily.      . potassium chloride SA (K-DUR,KLOR-CON) 20 MEQ tablet Take 1 tablet (20 mEq total) by mouth 2 (two) times daily. 180 tablet 3  . HYDROcodone-acetaminophen (NORCO) 10-325 MG per tablet Take 1 tablet by mouth 3 (three) times daily.       No current facility-administered medications for this visit.    Allergies:   Ciprofloxacin; Codeine; Contrast media; Coreg; Latex; Penicillins; Peppermint oil; Prednisone; and Tape   Past Medical History  Diagnosis Date  . Chronic systolic congestive heart failure     LVEF 25-30% with restrictive diastolic filling  . Atrial fibrillation   . Essential hypertension   . Bursitis   . GERD (gastroesophageal reflux disease)   . Cancer   . Cataract   . Gallstones   . Kidney stones   . Sleep apnea     CPAP  . Morbid obesity   . Chronic cellulitis   . Degenerative joint disease   . Gout   . Diverticulitis   . Anemia   . Sinoatrial node dysfunction     Status post PPM - Dr.  Lovena Le    Past Surgical History  Procedure Laterality Date  . Abdominal hysterectomy  1991  . Pacemaker insertion  Nov 2000    [place] with revision in 2011  . Eye surgery    . Percutaneous nephrolithotomy  April 2012     Social History:  The patient  reports that she quit smoking about 35 years ago. Her smoking use included Cigarettes. She smoked 1.00 pack per day. She has never used smokeless tobacco. She reports that she does not drink alcohol or use illicit drugs.   Family History:  The patient's family history includes Diabetes in her sister; Heart attack in her father; Stroke in her mother.    ROS:  Please see the history of present illness. All other systems are reviewed and  Negative to the above problem except as noted.    PHYSICAL EXAM: VS:  BP 137/65 mmHg  Pulse 90  Ht 5' 6"  (1.676 m)  Wt 310 lb (140.615 kg)  BMI 50.06 kg/m2  GEN: Morbidly obese 69 yo  in no acute distress HEENT:  normal Neck: no JVD, carotid bruits, or masses Cardiac: RRR; no murmurs, rubs, or gallops,no edema  Respiratory:  clear to auscultation bilaterally, normal work of breathing GI: soft, nontender, nondistended, + BS  No hepatomegaly  MS: no deformity Moving all extremities   Skin: warm and dry, no rash Neuro:  Strength and sensation are intact Psych: euthymic mood, full affect   EKG:  EKG is not ordered today.   Lipid Panel    Component Value Date/Time   CHOL 144 04/13/2014 1405   TRIG 146 04/13/2014 1405   HDL 38* 04/13/2014 1405   CHOLHDL 3.8 04/13/2014 1405   VLDL 29 04/13/2014 1405   LDLCALC 77 04/13/2014 1405      Wt Readings from Last 3 Encounters:  12/14/14 310 lb (140.615 kg)  11/12/14 306 lb 12.8 oz (139.164 kg)  11/02/14 300 lb 11.3 oz (136.4 kg)      ASSESSMENT AND PLAN:  1.  Chronic systolic CHF  Volume status is pretty good  BP is OK  I would check labs Keepo ncurrent regimen  I would not push further    2.  HL  Check lipids today     Current medicines are reviewed at length with the patient today.  The patient does not have concerns regarding medicines.  The following changes have been made: No change  Labs/ tests ordered today include:  BMET, BNP, lipid panel   No orders of the defined types were placed in this encounter.     Disposition:   FU with  Me in July  Signed, Xan Sparkman, MD  12/14/2014 2:48 PM    [address], [place], [place]  [id number] Phone: [phone]; Fax: [phone]

## 2014-12-14 NOTE — Patient Instructions (Signed)
Your physician recommends that you return for lab work in: TODAY (BMET, BNP, LIPIDS)  Your physician wants you to follow-up in: July, 2016 WITH DR ROSS.  You will receive a reminder letter in the mail two months in advance. If you don't receive a letter, please call our office to schedule the follow-up appointment.

## 2015-01-04 ENCOUNTER — Other Ambulatory Visit: Payer: No Typology Code available for payment source

## 2015-01-25 ENCOUNTER — Ambulatory Visit: Payer: Medicare Other | Admitting: Pulmonary Disease

## 2015-01-26 DIAGNOSIS — M25519 Pain in unspecified shoulder: Secondary | ICD-10-CM | POA: Diagnosis not present

## 2015-01-26 DIAGNOSIS — M1711 Unilateral primary osteoarthritis, right knee: Secondary | ICD-10-CM | POA: Diagnosis not present

## 2015-01-26 DIAGNOSIS — M109 Gout, unspecified: Secondary | ICD-10-CM | POA: Diagnosis not present

## 2015-01-26 DIAGNOSIS — M1712 Unilateral primary osteoarthritis, left knee: Secondary | ICD-10-CM | POA: Diagnosis not present

## 2015-01-26 DIAGNOSIS — M79642 Pain in left hand: Secondary | ICD-10-CM | POA: Diagnosis not present

## 2015-01-26 DIAGNOSIS — M79641 Pain in right hand: Secondary | ICD-10-CM | POA: Diagnosis not present

## 2015-01-26 DIAGNOSIS — N189 Chronic kidney disease, unspecified: Secondary | ICD-10-CM | POA: Diagnosis not present

## 2015-02-02 DIAGNOSIS — Z961 Presence of intraocular lens: Secondary | ICD-10-CM | POA: Diagnosis not present

## 2015-02-19 ENCOUNTER — Other Ambulatory Visit: Payer: Self-pay | Admitting: Pulmonary Disease

## 2015-02-19 DIAGNOSIS — G4733 Obstructive sleep apnea (adult) (pediatric): Secondary | ICD-10-CM

## 2015-02-23 ENCOUNTER — Ambulatory Visit (INDEPENDENT_AMBULATORY_CARE_PROVIDER_SITE_OTHER): Payer: Medicare Other | Admitting: Pulmonary Disease

## 2015-02-23 ENCOUNTER — Encounter: Payer: Self-pay | Admitting: Pulmonary Disease

## 2015-02-23 VITALS — BP 124/70 | HR 85 | Ht 66.0 in | Wt 309.0 lb

## 2015-02-23 DIAGNOSIS — G4733 Obstructive sleep apnea (adult) (pediatric): Secondary | ICD-10-CM

## 2015-02-23 NOTE — Assessment & Plan Note (Signed)
Ct Bipap 19/13 - good results & good compliance Check download  - does pr need to be decreased ? Weight loss encouraged, compliance with goal of at least 4-6 hrs every night is the expectation. Advised against medications with sedative side effects Cautioned against driving when sleepy - understanding that sleepiness will vary on a day to day basis

## 2015-02-23 NOTE — Patient Instructions (Signed)
BiPAP download will be checked

## 2015-02-23 NOTE — Progress Notes (Signed)
   Subjective:    Patient ID: [name] [name], female    DOB: 30-Apr-1946, 68 y.o.   MRN: [id number]  HPI Primary Provider: Dr. [name], Collie Siad (ENT)   68/F, morbidly obese ex- smoker for FU of obstructive sleep apnea.  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 Sleep Apnea    Wearing BiPAP every night, no problems with BiPAP.  Could not download card [facility]) pt will have downloaded at [facility].   Annual FU Lost 15 lbs She has no regular sleep habits, moves around in a motorised scooter.  Pt states she wearing her BIPAP everynight x 4.6-6.5 hrs a night. denies any problems w/ mask/machine.  mask ok, pressure ok, denies excessive daytime somnolence , no choking episodes, no bed partner hisotry available for objective data  Significant tests/ events Baseline PSG 05/2010 showed AHI 30/h, RDI 86/h & desaturation to 87%.  02/2003 >>wt 360 lbs >> BiPAP was re-titrated to 19/12 to abolish snoring although 15/11 was adequate for events.  CPAP was titrated again on 02/17/09 ([facility], Dr Radford Pax) to 13 cm >> she did not tolerate CPAP.   RPt titration 03/2013  showed corrected by BiPAP of 19/13 with a small fullface mask. Data 8/17-8/30/11 >> autoBIPAP - avg pr 17/15, good compliance, no residuals.  autoBiPAP download 10/25-11/8/11 >> avg pr 18/14, good compliance, AHI 3/h, no leak  Hospitalised 11/2010 Urosepsis with calculi, required PCN, required mech vent x 6ds,    Review of Systems neg for any significant sore throat, dysphagia, itching, sneezing, nasal congestion or excess/ purulent secretions, fever, chills, sweats, unintended wt loss, pleuritic or exertional cp, hempoptysis, orthopnea pnd or change in chronic leg swelling. Also denies presyncope, palpitations, heartburn, abdominal pain, nausea, vomiting, diarrhea or  change in bowel or urinary habits, dysuria,hematuria, rash, arthralgias, visual complaints, headache, numbness weakness or ataxia.     Objective:   Physical Exam  Gen. Pleasant, obese, in no distress ENT - no lesions, no post nasal drip Neck: No JVD, no thyromegaly, no carotid bruits Lungs: no use of accessory muscles, no dullness to percussion, decreased without rales or rhonchi  Cardiovascular: Rhythm regular, heart sounds  normal, no murmurs or gallops, no peripheral edema Musculoskeletal: No deformities, no cyanosis or clubbing , no tremors       Assessment & Plan:

## 2015-02-25 ENCOUNTER — Emergency Department (HOSPITAL_COMMUNITY): Payer: Medicare Other

## 2015-02-25 ENCOUNTER — Emergency Department (HOSPITAL_COMMUNITY)
Admission: EM | Admit: 2015-02-25 | Discharge: 2015-02-25 | Disposition: A | Discharge status: Home / Self Care | Payer: Medicare Other | Attending: Emergency Medicine | Admitting: Emergency Medicine

## 2015-02-25 ENCOUNTER — Encounter (HOSPITAL_COMMUNITY): Payer: Self-pay

## 2015-02-25 DIAGNOSIS — H109 Unspecified conjunctivitis: Secondary | ICD-10-CM | POA: Insufficient documentation

## 2015-02-25 DIAGNOSIS — Z87891 Personal history of nicotine dependence: Secondary | ICD-10-CM | POA: Insufficient documentation

## 2015-02-25 DIAGNOSIS — I10 Essential (primary) hypertension: Secondary | ICD-10-CM | POA: Insufficient documentation

## 2015-02-25 DIAGNOSIS — K219 Gastro-esophageal reflux disease without esophagitis: Secondary | ICD-10-CM | POA: Diagnosis not present

## 2015-02-25 DIAGNOSIS — R079 Chest pain, unspecified: Secondary | ICD-10-CM | POA: Insufficient documentation

## 2015-02-25 DIAGNOSIS — Z79899 Other long term (current) drug therapy: Secondary | ICD-10-CM | POA: Diagnosis not present

## 2015-02-25 DIAGNOSIS — Z862 Personal history of diseases of the blood and blood-forming organs and certain disorders involving the immune mechanism: Secondary | ICD-10-CM | POA: Diagnosis not present

## 2015-02-25 DIAGNOSIS — I5022 Chronic systolic (congestive) heart failure: Secondary | ICD-10-CM | POA: Diagnosis not present

## 2015-02-25 DIAGNOSIS — Z872 Personal history of diseases of the skin and subcutaneous tissue: Secondary | ICD-10-CM | POA: Insufficient documentation

## 2015-02-25 DIAGNOSIS — R0789 Other chest pain: Secondary | ICD-10-CM | POA: Diagnosis not present

## 2015-02-25 DIAGNOSIS — Z859 Personal history of malignant neoplasm, unspecified: Secondary | ICD-10-CM | POA: Insufficient documentation

## 2015-02-25 DIAGNOSIS — Z91040 Latex allergy status: Secondary | ICD-10-CM | POA: Diagnosis not present

## 2015-02-25 DIAGNOSIS — Z880 Allergy status to penicillin: Secondary | ICD-10-CM | POA: Insufficient documentation

## 2015-02-25 DIAGNOSIS — Z87442 Personal history of urinary calculi: Secondary | ICD-10-CM | POA: Diagnosis not present

## 2015-02-25 DIAGNOSIS — Z7982 Long term (current) use of aspirin: Secondary | ICD-10-CM | POA: Insufficient documentation

## 2015-02-25 DIAGNOSIS — R0602 Shortness of breath: Secondary | ICD-10-CM | POA: Diagnosis not present

## 2015-02-25 LAB — CBC WITH DIFFERENTIAL/PLATELET
BASOS ABS: 0 10*3/uL (ref 0.0–0.1)
BASOS PCT: 0 % (ref 0–1)
Eosinophils Absolute: 0.1 10*3/uL (ref 0.0–0.7)
Eosinophils Relative: 2 % (ref 0–5)
HCT: 38.6 % (ref 36.0–46.0)
Hemoglobin: 12.6 g/dL (ref 12.0–15.0)
Lymphocytes Relative: 18 % (ref 12–46)
Lymphs Abs: 1 10*3/uL (ref 0.7–4.0)
MCH: 32.8 pg (ref 26.0–34.0)
MCHC: 32.6 g/dL (ref 30.0–36.0)
MCV: 100.5 fL — AB (ref 78.0–100.0)
MONOS PCT: 11 % (ref 3–12)
Monocytes Absolute: 0.6 10*3/uL (ref 0.1–1.0)
NEUTROS PCT: 69 % (ref 43–77)
Neutro Abs: 3.9 10*3/uL (ref 1.7–7.7)
Platelets: 126 10*3/uL — ABNORMAL LOW (ref 150–400)
RBC: 3.84 MIL/uL — AB (ref 3.87–5.11)
RDW: 14.6 % (ref 11.5–15.5)
WBC: 5.8 10*3/uL (ref 4.0–10.5)

## 2015-02-25 LAB — BASIC METABOLIC PANEL
Anion gap: 13 (ref 5–15)
BUN: 33 mg/dL — AB (ref 6–20)
CALCIUM: 9.5 mg/dL (ref 8.9–10.3)
CHLORIDE: 98 mmol/L — AB (ref 101–111)
CO2: 30 mmol/L (ref 22–32)
CREATININE: 1.6 mg/dL — AB (ref 0.44–1.00)
GFR calc Af Amer: 37 mL/min — ABNORMAL LOW (ref >60)
GFR calc non Af Amer: 32 mL/min — ABNORMAL LOW (ref >60)
Glucose, Bld: 96 mg/dL (ref 65–99)
Potassium: 4.4 mmol/L (ref 3.5–5.1)
Sodium: 141 mmol/L (ref 135–145)

## 2015-02-25 LAB — BRAIN NATRIURETIC PEPTIDE: B NATRIURETIC PEPTIDE 5: 143 pg/mL — AB (ref 0.0–100.0)

## 2015-02-25 LAB — TROPONIN I: TROPONIN I: 0.03 ng/mL (ref <0.031)

## 2015-02-25 MED ORDER — NITROGLYCERIN 2 % TD OINT
1.0000 [in_us] | TOPICAL_OINTMENT | Freq: Once | TRANSDERMAL | Status: AC
  Administered 2015-02-25: 1 [in_us] via TOPICAL
  Filled 2015-02-25: qty 1

## 2015-02-25 MED ORDER — TRAMADOL HCL 50 MG PO TABS: 50.0000 mg | ORAL_TABLET | Freq: Four times a day (QID) | ORAL | Status: DC | PRN

## 2015-02-25 NOTE — ED Provider Notes (Signed)
CSN: [id number]     Arrival date & time 02/25/15  1822 History   First MD Initiated Contact with Patient 02/25/15 1926     Chief Complaint  Patient presents with  . Chest Pain     (Consider location/radiation/quality/duration/timing/severity/associated sxs/prior Treatment) Patient is a 69 y.o. female presenting with chest pain. The history is provided by the patient (the pt complains of some chest pain and swelling in legs).  Chest Pain Pain location:  L chest Pain quality: aching   Pain radiates to:  Does not radiate Pain radiates to the back: no   Pain severity:  Mild Onset quality:  Sudden Progression:  Waxing and waning Chronicity:  Recurrent Context: not breathing   Associated symptoms: no abdominal pain, no back pain, no cough, no fatigue and no headache     Past Medical History  Diagnosis Date  . Chronic systolic congestive heart failure     LVEF 25-30% with restrictive diastolic filling  . Atrial fibrillation   . Essential hypertension   . Bursitis   . GERD (gastroesophageal reflux disease)   . Cancer   . Cataract   . Gallstones   . Kidney stones   . Sleep apnea     CPAP  . Morbid obesity   . Chronic cellulitis   . Degenerative joint disease   . Gout   . Diverticulitis   . Anemia   . Sinoatrial node dysfunction     Status post PPM - Dr. Lovena Le   Past Surgical History  Procedure Laterality Date  . Abdominal hysterectomy  1991  . Pacemaker insertion  Nov 2000    [place] with revision in 2011  . Eye surgery    . Percutaneous nephrolithotomy  April 2012   Family History  Problem Relation Age of Onset  . Heart attack Father   . Diabetes Sister   . Stroke Mother    History  Substance Use Topics  . Smoking status: Former Smoker -- 1.00 packs/day    Types: Cigarettes    Quit date: 09/26/1979  . Smokeless tobacco: Never Used  . Alcohol Use: No   OB History    No data available     Review of Systems  Constitutional: Negative for appetite change  and fatigue.  HENT: Negative for congestion, ear discharge and sinus pressure.   Eyes: Negative for discharge.  Respiratory: Negative for cough.   Cardiovascular: Positive for chest pain.  Gastrointestinal: Negative for abdominal pain and diarrhea.  Genitourinary: Negative for frequency and hematuria.  Musculoskeletal: Negative for back pain.  Skin: Negative for rash.  Neurological: Negative for seizures and headaches.  Psychiatric/Behavioral: Negative for hallucinations.      Allergies  Ciprofloxacin; Codeine; Contrast media; Coreg; Latex; Penicillins; Peppermint oil; Prednisone; and Tape  Home Medications   Prior to Admission medications   Medication Sig Start Date End Date Taking? Authorizing Provider  allopurinol (ZYLOPRIM) 300 MG tablet Take 300 mg by mouth daily.      Historical Provider, MD  aspirin 325 MG tablet Take 325 mg by mouth daily.      Historical Provider, MD  calcitRIOL (ROCALTROL) 0.25 MCG capsule Take 0.25 mcg by mouth daily.    Historical Provider, MD  Calcium Carbonate (CALCIUM 600 PO) Take 1 tablet by mouth daily.    Historical Provider, MD  COD LIVER OIL PO Take 1 tablet by mouth daily.      Historical Provider, MD  colchicine-probenecid 0.5-500 MG per tablet Take 1 tablet by mouth 2 (  two) times daily. Gout flare up 02/24/13   Historical Provider, MD  Cyanocobalamin (VITAMIN B 12 PO) Place 1 tablet under the tongue every morning. Patient takes 500 mg by mouth daily    Historical Provider, MD  docusate sodium (COLACE) 100 MG capsule Take 100 mg by mouth daily as needed for mild constipation.    Historical Provider, MD  ferrous sulfate 325 (65 FE) MG tablet Take 325 mg by mouth daily with breakfast.    Historical Provider, MD  furosemide (LASIX) 20 MG tablet Take 3 tablets (60 mg total) by mouth 2 (two) times daily. 11/02/14   Hosie Poisson, MD  hydrALAZINE (APRESOLINE) 25 MG tablet Take 0.5 tablets (12.5 mg total) by mouth 3 (three) times daily. 11/12/14   Fay Records, MD  HYDROcodone-acetaminophen (NORCO) 10-325 MG per tablet Take 1 tablet by mouth 3 (three) times daily.      Historical Provider, MD  isosorbide mononitrate (IMDUR) 30 MG 24 hr tablet Take 1 tablet (30 mg total) by mouth daily. 11/12/14   Fay Records, MD  Multiple Vitamin (MULTIVITAMIN) tablet Take 1 tablet by mouth daily.    Historical Provider, MD  omeprazole (PRILOSEC) 20 MG capsule Take 20 mg by mouth daily.      Historical Provider, MD  potassium chloride SA (K-DUR,KLOR-CON) 20 MEQ tablet Take 1 tablet (20 mEq total) by mouth 2 (two) times daily. 04/14/14   Fay Records, MD  traMADol (ULTRAM) 50 MG tablet Take 1 tablet (50 mg total) by mouth every 6 (six) hours as needed. 02/25/15   Milton Ferguson, MD   BP 147/84 mmHg  Pulse 90  Temp(Src) 97.7 F (36.5 C) (Oral)  Resp 20  Ht 5' 6"  (1.676 m)  Wt 309 lb (140.161 kg)  BMI 49.90 kg/m2  SpO2 99% Physical Exam  Constitutional: She is oriented to person, place, and time. She appears well-developed.  HENT:  Head: Normocephalic.  Eyes: Conjunctivae and EOM are normal. No scleral icterus.  Neck: Neck supple. No thyromegaly present.  Cardiovascular: Normal rate and regular rhythm.  Exam reveals no gallop and no friction rub.   No murmur heard. Pulmonary/Chest: No stridor. She has no wheezes. She has no rales. She exhibits no tenderness.  Abdominal: She exhibits no distension. There is no tenderness. There is no rebound.  Musculoskeletal: Normal range of motion. She exhibits edema.  3 plus edema in ankles  Lymphadenopathy:    She has no cervical adenopathy.  Neurological: She is oriented to person, place, and time. She exhibits normal muscle tone. Coordination normal.  Skin: No rash noted. No erythema.  Psychiatric: She has a normal mood and affect. Her behavior is normal.    ED Course  Procedures (including critical care time) Labs Review Labs Reviewed  CBC WITH DIFFERENTIAL/PLATELET - Abnormal; Notable for the following:    RBC  3.84 (*)    MCV 100.5 (*)    Platelets 126 (*)    All other components within normal limits  BASIC METABOLIC PANEL - Abnormal; Notable for the following:    Chloride 98 (*)    BUN 33 (*)    Creatinine, Ser 1.60 (*)    GFR calc non Af Amer 32 (*)    GFR calc Af Amer 37 (*)    All other components within normal limits  BRAIN NATRIURETIC PEPTIDE - Abnormal; Notable for the following:    B Natriuretic Peptide 143.0 (*)    All other components within normal limits  TROPONIN I  Imaging Review Dg Chest 2 View  02/25/2015   CLINICAL DATA:  Left chest pain radiating into the shoulder. Weakness. Shortness of breath and leg swelling.  EXAM: CHEST  2 VIEW  COMPARISON:  10/30/2014 and 03/06/2013 radiographs.  FINDINGS: Moderate enlargement of the cardiac silhouette is unchanged. The left subclavian pacemaker lead appears unchanged at the right ventricular apex. There is stable vascular congestion without overt pulmonary edema, confluent airspace opacity or pleural effusion. No acute osseous findings identified. Glenohumeral degenerative changes are present bilaterally.  IMPRESSION: Stable cardiomegaly and chronic vascular congestion. No edema or acute findings demonstrated.   Electronically Signed   By: Richardean Sale M.D.   On: 02/25/2015 19:06     EKG Interpretation   Date/Time:  Thursday February 25 2015 18:39:33 EDT Ventricular Rate:  90 PR Interval:    QRS Duration: 174 QT Interval:  430 QTC Calculation: 526 R Axis:   -78 Text Interpretation:  Ventricular-paced rhythm Abnormal ECG Confirmed by  Shanelle Clontz  MD, Broadus John [phone]) on 02/25/2015 9:44:57 PM      MDM   Final diagnoses:  Chest pain at rest    Chf,  Will increase lasix and have pt follow up with her md      Milton Ferguson, MD 02/25/15 2149

## 2015-02-25 NOTE — Discharge Instructions (Signed)
Increase lasix 20 mg a day,  Follow up with your family md next week

## 2015-02-25 NOTE — ED Notes (Signed)
Pt c/o chest pain that started SUnday.  Reports pain is in the left chest radiates to left shoulder and left arm feels weak.  Pt also c/o SOB and swelling in legs.  Reports Sunday, pt turned her electric wheelchair over but says did not hurt herself.

## 2015-02-26 ENCOUNTER — Telehealth: Payer: Self-pay | Admitting: Internal Medicine

## 2015-02-26 DIAGNOSIS — I10 Essential (primary) hypertension: Secondary | ICD-10-CM

## 2015-02-26 DIAGNOSIS — I5021 Acute systolic (congestive) heart failure: Secondary | ICD-10-CM

## 2015-02-26 NOTE — Telephone Encounter (Signed)
Left message to call back  

## 2015-02-26 NOTE — Telephone Encounter (Signed)
New message      Pt was seen at ER last night ([facility]).  They changed her lasix to 58m in am and 615mat night for 6 days.  She also has a nitro patch on chest.  All labs turned out all right..  Her scooter turned over on top of her.  It was 30 minutes before anyone came to get her.  That is why she was at the ER.

## 2015-02-26 NOTE — Telephone Encounter (Signed)
Need to make sure that pt has f/u appt next week to reassess fluid status and check BMET  Could be in [place] if easier.

## 2015-02-26 NOTE — Telephone Encounter (Addendum)
Left message to call back. Will forward to Dr. Harrington Challenger to make her aware.

## 2015-02-26 NOTE — Telephone Encounter (Signed)
Spoke with pt and informed her that Dr. Harrington Challenger would like for her to have a f/u appt next week to reassess fluid status and check BMET. Pt agreeable to come in and states she was fine with coming in to [facility].  Appt scheduled 03/01/15 with labs ordered.

## 2015-03-01 ENCOUNTER — Ambulatory Visit (INDEPENDENT_AMBULATORY_CARE_PROVIDER_SITE_OTHER): Payer: Medicare Other | Admitting: *Deleted

## 2015-03-01 ENCOUNTER — Ambulatory Visit (INDEPENDENT_AMBULATORY_CARE_PROVIDER_SITE_OTHER): Payer: Medicare Other | Admitting: Internal Medicine

## 2015-03-01 ENCOUNTER — Other Ambulatory Visit (INDEPENDENT_AMBULATORY_CARE_PROVIDER_SITE_OTHER): Payer: Medicare Other | Admitting: *Deleted

## 2015-03-01 ENCOUNTER — Encounter: Payer: Self-pay | Admitting: Internal Medicine

## 2015-03-01 VITALS — BP 130/66 | HR 91 | Ht 66.0 in | Wt 326.0 lb

## 2015-03-01 DIAGNOSIS — I5021 Acute systolic (congestive) heart failure: Secondary | ICD-10-CM

## 2015-03-01 DIAGNOSIS — I482 Chronic atrial fibrillation, unspecified: Secondary | ICD-10-CM

## 2015-03-01 DIAGNOSIS — I10 Essential (primary) hypertension: Secondary | ICD-10-CM

## 2015-03-01 DIAGNOSIS — I4891 Unspecified atrial fibrillation: Secondary | ICD-10-CM

## 2015-03-01 DIAGNOSIS — I509 Heart failure, unspecified: Secondary | ICD-10-CM

## 2015-03-01 DIAGNOSIS — R079 Chest pain, unspecified: Secondary | ICD-10-CM

## 2015-03-01 LAB — BASIC METABOLIC PANEL WITH GFR
BUN: 31 mg/dL — ABNORMAL HIGH (ref 6–23)
CO2: 32 meq/L (ref 19–32)
Calcium: 9.6 mg/dL (ref 8.4–10.5)
Chloride: 98 meq/L (ref 96–112)
Creatinine, Ser: 1.98 mg/dL — ABNORMAL HIGH (ref 0.40–1.20)
GFR: 26.49 mL/min — ABNORMAL LOW
Glucose, Bld: 85 mg/dL (ref 70–99)
Potassium: 4.1 meq/L (ref 3.5–5.1)
Sodium: 137 meq/L (ref 135–145)

## 2015-03-01 LAB — CUP PACEART INCLINIC DEVICE CHECK
Battery Remaining Longevity: 102 mo
Battery Voltage: 2.93 V
Brady Statistic RV Percent Paced: 99.79 %
Date Time Interrogation Session: 20160606155621
Lead Channel Impedance Value: 612.5 Ohm
Lead Channel Pacing Threshold Amplitude: 0.625 V
Lead Channel Pacing Threshold Pulse Width: 0.4 ms
Lead Channel Sensing Intrinsic Amplitude: 12 mV
Lead Channel Setting Pacing Amplitude: 0.875
Lead Channel Setting Pacing Pulse Width: 0.4 ms
Lead Channel Setting Sensing Sensitivity: 4 mV
Pulse Gen Model: 1210
Pulse Gen Serial Number: 7171350

## 2015-03-01 NOTE — Progress Notes (Signed)
Pacemaker check in clinic w/Dr.Ross. Normal device function. Threshold, sensing, and impedance consistent with previous measurements. Device programmed to maximize longevity. Permanent AF + ASA 325//pt refuses A/C. No high ventricular rates noted. Device programmed at appropriate safety margins. Histogram distribution appropriate for patient activity level. Device programmed to optimize intrinsic conduction. Estimated longevity 8.5-9.1 years. Patient will follow up with GT/R in 08-2015.

## 2015-03-01 NOTE — Patient Instructions (Addendum)
Medication Instructions:  Your physician recommends that you continue on your current medications as directed. Please refer to the Current Medication list given to you today.  Labwork: Your physician recommends that you have lab work today: BMP   Testing/Procedures: Your physician has requested that you have a lexiscan myoview. For further information please visit [email or web address]. Please follow instruction sheet, as given.  Follow-Up: Your physician recommends that you schedule a follow-up appointment in: End of August with Dr Harrington Challenger   Any Other Special Instructions Will Be Listed Below (If Applicable).

## 2015-03-01 NOTE — Progress Notes (Signed)
Cardiology Office Note   Date:  03/01/2015   ID:  Rudie, Rikard [birth year], MRN [id number]  PCP:  [name], MD  Cardiologist:   Dorris Carnes, MD   Chief Complaint  Patient presents with  . Follow-up    Dyslipidemia      History of Present Illness: [name] [name] is a 69 y.o. female with a history of HTN, afib, chronic systolic CHF (LVEF 20 to 40%), morbid obesity Refuses anticoagulation. 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 I saw her in March   Was seen in ER on 6/2 with Chest pain and edema    Lasix was increased to 80 am and 60 pm  No real change in how feels   Scooter fell sideways prior to ER visit.       Current Outpatient Prescriptions  Medication Sig Dispense Refill  . allopurinol (ZYLOPRIM) 300 MG tablet Take 300 mg by mouth daily.      Marland Kitchen aspirin 325 MG tablet Take 325 mg by mouth daily.      . calcitRIOL (ROCALTROL) 0.25 MCG capsule Take 0.25 mcg by mouth daily.    . Calcium Carbonate (CALCIUM 600 PO) Take 1 tablet by mouth daily.    . COD LIVER OIL PO Take 1 tablet by mouth daily.      . colchicine-probenecid 0.5-500 MG per tablet Take 1 tablet by mouth 2 (two) times daily. Gout flare up    . Cyanocobalamin (VITAMIN B 12 PO) Place 1 tablet under the tongue every morning. Patient takes 500 mg by mouth daily    . docusate sodium (COLACE) 100 MG capsule Take 100 mg by mouth daily as needed for mild constipation.    . ferrous sulfate 325 (65 FE) MG tablet Take 325 mg by mouth daily with breakfast.    . furosemide (LASIX) 40 MG tablet Take 40 mg by mouth 2 (two) times daily. Pt takes 80 mg in the am and 60 mg in pm for six days.  3  . hydrALAZINE (APRESOLINE) 25 MG tablet Take 0.5 tablets (12.5 mg total) by mouth 3 (three) times daily. 45 tablet 11  . HYDROcodone-acetaminophen (NORCO) 10-325 MG per tablet Take 1 tablet by mouth 3 (three) times daily.      . isosorbide mononitrate (IMDUR) 30 MG 24 hr tablet Take 1 tablet (30 mg total) by  mouth daily. 30 tablet 11  . Multiple Vitamin (MULTIVITAMIN) tablet Take 1 tablet by mouth daily.    Marland Kitchen omeprazole (PRILOSEC) 20 MG capsule Take 20 mg by mouth daily.      . potassium chloride SA (K-DUR,KLOR-CON) 20 MEQ tablet Take 1 tablet (20 mEq total) by mouth 2 (two) times daily. 180 tablet 3   No current facility-administered medications for this visit.    Allergies:   Ciprofloxacin; Codeine; Contrast media; Coreg; Latex; Penicillins; Peppermint oil; Prednisone; and Tape   Past Medical History  Diagnosis Date  . Chronic systolic congestive heart failure     LVEF 25-30% with restrictive diastolic filling  . Atrial fibrillation   . Essential hypertension   . Bursitis   . GERD (gastroesophageal reflux disease)   . Cancer   . Cataract   . Gallstones   . Kidney stones   . Sleep apnea     CPAP  . Morbid obesity   . Chronic cellulitis   . Degenerative joint disease   . Gout   . Diverticulitis   . Anemia   .  Sinoatrial node dysfunction     Status post PPM - Dr. Lovena Le    Past Surgical History  Procedure Laterality Date  . Abdominal hysterectomy  1991  . Pacemaker insertion  Nov 2000    [place] with revision in 2011  . Eye surgery    . Percutaneous nephrolithotomy  April 2012     Social History:  The patient  reports that she quit smoking about 35 years ago. Her smoking use included Cigarettes. She smoked 1.00 pack per day. She has never used smokeless tobacco. She reports that she does not drink alcohol or use illicit drugs.   Family History:  The patient's family history includes Diabetes in her sister; Heart attack in her father; Stroke in her mother.    ROS:  Please see the history of present illness. All other systems are reviewed and  Negative to the above problem except as noted.    PHYSICAL EXAM: VS:  BP 130/66 mmHg  Pulse 91  Ht 5' 6"  (1.676 m)  Wt 326 lb (147.873 kg)  BMI 52.64 kg/m2  SpO2 95%  GEN: Morbidly obese 69 yo, in no acute distressExamined in  scooter HEENT: normal Neck: no JVD, carotid bruits Cardiac: RRR; no murmurs, rubs, or gallops,1+ edema  Respiratory:  clear to auscultation bilaterally, normal work of breathing GI: soft, nontender + BS  No hepatomegaly     EKG:  EKG is  Not ordered today.   Lipid Panel    Component Value Date/Time   CHOL 142 12/14/2014 1506   TRIG 141.0 12/14/2014 1506   HDL 45.90 12/14/2014 1506   CHOLHDL 3 12/14/2014 1506   VLDL 28.2 12/14/2014 1506   LDLCALC 68 12/14/2014 1506      Wt Readings from Last 3 Encounters:  03/01/15 326 lb (147.873 kg)  02/25/15 309 lb (140.161 kg)  02/23/15 309 lb (140.161 kg)      ASSESSMENT AND PLAN: 1.  CP  I am not convinced represents ischemia  But, nott clearly musculoskeletal WIll set up for Lexiscan myoview  2.  Chronic systolic CHF  Volume status appears mildly increased.  Will check BMET and BNP Refuses b blocker  3  Atrial fib  Pacer interrogated  Pt is paced at 90 beats per min (patient says Dr Lovena Le told her her HR should not be lower)  About 10% of time she is 90 to 130.  Refuses anticoagulation  Disposition:   FU with me in the late summer  Sooner deprending on test results    Signed, Dorris Carnes, MD  03/01/2015 2:45 PM    [address], [place], [place]  [address] Phone: [phone]; Fax: [phone]

## 2015-03-04 ENCOUNTER — Encounter: Payer: Self-pay | Admitting: Internal Medicine

## 2015-03-10 ENCOUNTER — Telehealth: Payer: Self-pay | Admitting: Pulmonary Disease

## 2015-03-10 NOTE — Telephone Encounter (Signed)
On 19/13 No residuals Good Usage No changes AHI: 1.9

## 2015-03-11 NOTE — Telephone Encounter (Signed)
Advised patient's daughter, Venida Jarvis of results.  She says that her mom has not complained of any issues with her CPAP machine.  Nothing further needed.

## 2015-03-15 ENCOUNTER — Telehealth (HOSPITAL_COMMUNITY): Payer: Self-pay | Admitting: *Deleted

## 2015-03-15 ENCOUNTER — Other Ambulatory Visit: Payer: Self-pay | Admitting: Internal Medicine

## 2015-03-15 NOTE — Telephone Encounter (Signed)
Left message on voicemail in reference to upcoming appointment scheduled for 03/17/15. Phone number given for a call back so details instructions can be given. Retina Bernardy, Ranae Palms

## 2015-03-15 NOTE — Telephone Encounter (Signed)
Patient given detailed instructions per Myocardial Perfusion Study Information Sheet for test on 03/17/15 at 0745. Patient Notified to arrive 15 minutes early, and that it is imperative to arrive on time for appointment to keep from having the test rescheduled. Patient verbalized understanding. [name] [name], Ranae Palms

## 2015-03-15 NOTE — Telephone Encounter (Signed)
Needs new RX for Lasix sent to [facility] due to dosage change / tg

## 2015-03-16 NOTE — Telephone Encounter (Signed)
Patient's Lasix was increased to 48m in am and 658min PM FOR 6 DAYS ONLY after an ED visit. She should be back on 4053mid. Pharmacy notified of this, as they mail her the scripts.

## 2015-03-17 ENCOUNTER — Encounter: Payer: Self-pay | Admitting: Internal Medicine

## 2015-03-17 ENCOUNTER — Ambulatory Visit (HOSPITAL_COMMUNITY): Payer: Medicare Other | Attending: Cardiology

## 2015-03-17 DIAGNOSIS — R002 Palpitations: Secondary | ICD-10-CM | POA: Insufficient documentation

## 2015-03-17 DIAGNOSIS — R079 Chest pain, unspecified: Secondary | ICD-10-CM | POA: Diagnosis present

## 2015-03-17 DIAGNOSIS — R0609 Other forms of dyspnea: Secondary | ICD-10-CM | POA: Insufficient documentation

## 2015-03-17 DIAGNOSIS — I10 Essential (primary) hypertension: Secondary | ICD-10-CM | POA: Insufficient documentation

## 2015-03-17 DIAGNOSIS — R9439 Abnormal result of other cardiovascular function study: Secondary | ICD-10-CM | POA: Diagnosis not present

## 2015-03-17 MED ORDER — TECHNETIUM TC 99M SESTAMIBI GENERIC - CARDIOLITE
33.0000 | Freq: Once | INTRAVENOUS | Status: AC | PRN
  Administered 2015-03-17: 33 via INTRAVENOUS

## 2015-03-17 MED ORDER — REGADENOSON 0.4 MG/5ML IV SOLN
0.4000 mg | Freq: Once | INTRAVENOUS | Status: AC
  Administered 2015-03-17: 0.4 mg via INTRAVENOUS

## 2015-03-18 ENCOUNTER — Ambulatory Visit (HOSPITAL_COMMUNITY): Payer: Medicare Other | Attending: Cardiovascular Disease

## 2015-03-18 ENCOUNTER — Ambulatory Visit (HOSPITAL_COMMUNITY): Payer: Medicare Other

## 2015-03-18 LAB — MYOCARDIAL PERFUSION IMAGING
CHL CUP NUCLEAR SDS: 4
CHL CUP NUCLEAR SRS: 3
CSEPPHR: 94 {beats}/min
LHR: 0.33
LV dias vol: 142 mL
LV sys vol: 74 mL
Rest HR: 90 {beats}/min
SSS: 7
TID: 0.9

## 2015-03-18 MED ORDER — TECHNETIUM TC 99M SESTAMIBI GENERIC - CARDIOLITE
33.0000 | Freq: Once | INTRAVENOUS | Status: AC | PRN
  Administered 2015-03-18: 33 via INTRAVENOUS

## 2015-03-26 ENCOUNTER — Other Ambulatory Visit: Payer: Self-pay | Admitting: *Deleted

## 2015-03-26 ENCOUNTER — Encounter: Payer: Self-pay | Admitting: *Deleted

## 2015-03-26 MED ORDER — FUROSEMIDE 40 MG PO TABS: 60.0000 mg | ORAL_TABLET | Freq: Two times a day (BID) | ORAL | Status: DC

## 2015-03-26 NOTE — Progress Notes (Unsigned)
Called patient to inform of stress test results. She voiced that her dose of lasix was changed to 60 mg bid after her recent labs for Dr. Harrington Challenger.  --go back to previous dose of lasix, 80 mg AM and 60 mg PM is to much for her kidneys-- Pt needed new prescription.  Order changed, med sent to [facility].

## 2015-03-31 ENCOUNTER — Encounter: Payer: Self-pay | Admitting: Pulmonary Disease

## 2015-04-01 ENCOUNTER — Ambulatory Visit: Payer: Medicare Other | Admitting: Internal Medicine

## 2015-04-22 DIAGNOSIS — M1711 Unilateral primary osteoarthritis, right knee: Secondary | ICD-10-CM | POA: Diagnosis not present

## 2015-04-22 DIAGNOSIS — M1712 Unilateral primary osteoarthritis, left knee: Secondary | ICD-10-CM | POA: Diagnosis not present

## 2015-04-22 DIAGNOSIS — M109 Gout, unspecified: Secondary | ICD-10-CM | POA: Diagnosis not present

## 2015-04-22 DIAGNOSIS — M25532 Pain in left wrist: Secondary | ICD-10-CM | POA: Diagnosis not present

## 2015-04-22 DIAGNOSIS — M25522 Pain in left elbow: Secondary | ICD-10-CM | POA: Diagnosis not present

## 2015-04-22 DIAGNOSIS — M25531 Pain in right wrist: Secondary | ICD-10-CM | POA: Diagnosis not present

## 2015-04-28 ENCOUNTER — Other Ambulatory Visit: Payer: Self-pay

## 2015-04-28 MED ORDER — POTASSIUM CHLORIDE CRYS ER 20 MEQ PO TBCR: 20.0000 meq | EXTENDED_RELEASE_TABLET | Freq: Two times a day (BID) | ORAL | Status: DC

## 2015-05-13 DIAGNOSIS — N2581 Secondary hyperparathyroidism of renal origin: Secondary | ICD-10-CM | POA: Diagnosis not present

## 2015-05-13 DIAGNOSIS — D631 Anemia in chronic kidney disease: Secondary | ICD-10-CM | POA: Diagnosis not present

## 2015-05-13 DIAGNOSIS — N183 Chronic kidney disease, stage 3 (moderate): Secondary | ICD-10-CM | POA: Diagnosis not present

## 2015-05-13 DIAGNOSIS — I129 Hypertensive chronic kidney disease with stage 1 through stage 4 chronic kidney disease, or unspecified chronic kidney disease: Secondary | ICD-10-CM | POA: Diagnosis not present

## 2015-05-24 ENCOUNTER — Ambulatory Visit (INDEPENDENT_AMBULATORY_CARE_PROVIDER_SITE_OTHER): Payer: Medicare Other | Admitting: Internal Medicine

## 2015-05-24 ENCOUNTER — Encounter: Payer: Self-pay | Admitting: Internal Medicine

## 2015-05-24 VITALS — BP 144/70 | HR 102 | Ht 66.0 in | Wt 327.5 lb

## 2015-05-24 DIAGNOSIS — I5022 Chronic systolic (congestive) heart failure: Secondary | ICD-10-CM

## 2015-05-24 DIAGNOSIS — I481 Persistent atrial fibrillation: Secondary | ICD-10-CM | POA: Diagnosis not present

## 2015-05-24 DIAGNOSIS — I10 Essential (primary) hypertension: Secondary | ICD-10-CM

## 2015-05-24 DIAGNOSIS — I4819 Other persistent atrial fibrillation: Secondary | ICD-10-CM

## 2015-05-24 NOTE — Patient Instructions (Signed)
Your physician recommends that you schedule a follow-up appointment in: to be determined,Dr Harrington Challenger will call you     Thank you for choosing [facility] !

## 2015-05-24 NOTE — Progress Notes (Signed)
Cardiology Office Note   Date:  05/24/2015   ID:  [name], [name] 1946-08-30, MRN [id number]  PCP:  [name], MD  Cardiologist:   Dorris Carnes, MD   No chief complaint on file.   Pt returns for  History of Present Illness: [name] [name] is a 69 y.o. female with a history of HTN, chronic systolic CHF, morbid obesity and atrial fib  Refuses anticoagulation.  She is sp PPM after HB after AVN ablation.  I saw her in June  Lasix had been increased prior  Experienced CP which may have been musculoskeltal  Myoview showed no ischmia  LVEF 48%     Since seen chest pains have improved.  Breathing is fair.    Seen by Dr Deterding a couple wks ago Last wk had nausea and cramps in legs  Nausea resolved Still with cramps in legs. She says that she feels worse on bid KCL  Felt better with few cramps on 1x per day  She was taking bid when she had labs drawn at kidney clinic   Current Outpatient Prescriptions  Medication Sig Dispense Refill  . allopurinol (ZYLOPRIM) 300 MG tablet Take 300 mg by mouth daily.      Marland Kitchen aspirin 325 MG tablet Take 325 mg by mouth daily.      . calcitRIOL (ROCALTROL) 0.25 MCG capsule Take 0.25 mcg by mouth daily.    . Calcium Carbonate (CALCIUM 600 PO) Take 1 tablet by mouth daily.    . COD LIVER OIL PO Take 1 tablet by mouth daily.      . colchicine-probenecid 0.5-500 MG per tablet Take 1 tablet by mouth 2 (two) times daily. Gout flare up    . Cyanocobalamin (VITAMIN B 12 PO) Place 1 tablet under the tongue every morning. Patient takes 500 mg by mouth daily    . docusate sodium (COLACE) 100 MG capsule Take 100 mg by mouth daily as needed for mild constipation.    . ferrous sulfate 325 (65 FE) MG tablet Take 325 mg by mouth daily with breakfast.    . furosemide (LASIX) 40 MG tablet Take 1.5 tablets (60 mg total) by mouth 2 (two) times daily. 90 tablet 11  . hydrALAZINE (APRESOLINE) 25 MG tablet Take 0.5 tablets (12.5 mg total) by mouth 3 (three) times daily.  45 tablet 11  . HYDROcodone-acetaminophen (NORCO) 10-325 MG per tablet Take 1 tablet by mouth 3 (three) times daily.      . isosorbide mononitrate (IMDUR) 30 MG 24 hr tablet Take 1 tablet (30 mg total) by mouth daily. 30 tablet 11  . Multiple Vitamin (MULTIVITAMIN) tablet Take 1 tablet by mouth daily.    Marland Kitchen omeprazole (PRILOSEC) 20 MG capsule Take 20 mg by mouth daily.      . potassium chloride SA (K-DUR,KLOR-CON) 20 MEQ tablet Take 1 tablet (20 mEq total) by mouth 2 (two) times daily. 60 tablet 1   No current facility-administered medications for this visit.    Allergies:   Ciprofloxacin; Codeine; Contrast media; Coreg; Latex; Penicillins; Peppermint oil; Prednisone; and Tape   Past Medical History  Diagnosis Date  . Chronic systolic congestive heart failure     LVEF 25-30% with restrictive diastolic filling  . Atrial fibrillation   . Essential hypertension   . Bursitis   . GERD (gastroesophageal reflux disease)   . Cancer   . Cataract   . Gallstones   . Kidney stones   . Sleep apnea     CPAP  .  Morbid obesity   . Chronic cellulitis   . Degenerative joint disease   . Gout   . Diverticulitis   . Anemia   . Sinoatrial node dysfunction     Status post PPM - Dr. Lovena Le    Past Surgical History  Procedure Laterality Date  . Abdominal hysterectomy  1991  . Pacemaker insertion  Nov 2000    [place] with revision in 2011  . Eye surgery    . Percutaneous nephrolithotomy  April 2012     Social History:  The patient  reports that she quit smoking about 35 years ago. Her smoking use included Cigarettes. She smoked 1.00 pack per day. She has never used smokeless tobacco. She reports that she does not drink alcohol or use illicit drugs.   Family History:  The patient's family history includes Diabetes in her sister; Heart attack in her father; Stroke in her mother.    ROS:  Please see the history of present illness. All other systems are reviewed and  Negative to the above problem  except as noted.    PHYSICAL EXAM: VS:  BP 144/70 mmHg  Pulse 102  Ht 5' 6"  (1.676 m)  Wt 327 lb 8 oz (148.553 kg)  BMI 52.89 kg/m2  SpO2 97%  GEN: Morbidly obese 69 yo, in no acute distressExamined in chair. HEENT: normal Neck: no JVD, carotid bruits, or masses Cardiac: RRR; no murmurs, rubs, or gallops,1-2+ edema  Respiratory:  clear to auscultation bilaterally, normal work of breathing GI: soft, nontender, nondistended, + BS  No hepatomegaly  MS: no deformity Moving all extremities   Skin: warm and dry, no rash Neuro:  Strength and sensation are intact Psych: euthymic mood, full affect   EKG:  EKG is not ordered today.   Lipid Panel    Component Value Date/Time   CHOL 142 12/14/2014 1506   TRIG 141.0 12/14/2014 1506   HDL 45.90 12/14/2014 1506   CHOLHDL 3 12/14/2014 1506   VLDL 28.2 12/14/2014 1506   LDLCALC 68 12/14/2014 1506      Wt Readings from Last 3 Encounters:  05/24/15 327 lb 8 oz (148.553 kg)  03/17/15 326 lb (147.873 kg)  03/01/15 326 lb (147.873 kg)      ASSESSMENT AND PLAN:  1.  Chronic systolic CHF  Need to get records from renal clinic.  I would not make any changes for now.  2.  HTN  Follow BP    3  Chronic atrial fib  Has refused anticoagulation.       Signed, Dorris Carnes, MD  05/24/2015 3:37 PM    [address], [place], [place]  [address] Phone: [phone]; Fax: [phone]

## 2015-07-12 DIAGNOSIS — L4050 Arthropathic psoriasis, unspecified: Secondary | ICD-10-CM | POA: Diagnosis not present

## 2015-07-12 DIAGNOSIS — M25511 Pain in right shoulder: Secondary | ICD-10-CM | POA: Diagnosis not present

## 2015-07-12 DIAGNOSIS — M1711 Unilateral primary osteoarthritis, right knee: Secondary | ICD-10-CM | POA: Diagnosis not present

## 2015-07-12 DIAGNOSIS — M1712 Unilateral primary osteoarthritis, left knee: Secondary | ICD-10-CM | POA: Diagnosis not present

## 2015-07-12 DIAGNOSIS — M109 Gout, unspecified: Secondary | ICD-10-CM | POA: Diagnosis not present

## 2015-07-12 DIAGNOSIS — M25512 Pain in left shoulder: Secondary | ICD-10-CM | POA: Diagnosis not present

## 2015-07-12 DIAGNOSIS — Z79899 Other long term (current) drug therapy: Secondary | ICD-10-CM | POA: Diagnosis not present

## 2015-07-16 ENCOUNTER — Other Ambulatory Visit: Payer: Self-pay | Admitting: *Deleted

## 2015-07-16 MED ORDER — POTASSIUM CHLORIDE CRYS ER 20 MEQ PO TBCR: 20.0000 meq | EXTENDED_RELEASE_TABLET | Freq: Two times a day (BID) | ORAL | Status: DC

## 2015-07-26 DIAGNOSIS — Z0001 Encounter for general adult medical examination with abnormal findings: Secondary | ICD-10-CM | POA: Diagnosis not present

## 2015-07-26 DIAGNOSIS — I10 Essential (primary) hypertension: Secondary | ICD-10-CM | POA: Diagnosis not present

## 2015-07-26 DIAGNOSIS — M109 Gout, unspecified: Secondary | ICD-10-CM | POA: Diagnosis not present

## 2015-07-26 DIAGNOSIS — I509 Heart failure, unspecified: Secondary | ICD-10-CM | POA: Diagnosis not present

## 2015-07-26 DIAGNOSIS — Z79899 Other long term (current) drug therapy: Secondary | ICD-10-CM | POA: Diagnosis not present

## 2015-07-26 DIAGNOSIS — Z23 Encounter for immunization: Secondary | ICD-10-CM | POA: Diagnosis not present

## 2015-07-26 DIAGNOSIS — G4733 Obstructive sleep apnea (adult) (pediatric): Secondary | ICD-10-CM | POA: Diagnosis not present

## 2015-07-26 DIAGNOSIS — N183 Chronic kidney disease, stage 3 (moderate): Secondary | ICD-10-CM | POA: Diagnosis not present

## 2015-07-26 DIAGNOSIS — M8580 Other specified disorders of bone density and structure, unspecified site: Secondary | ICD-10-CM | POA: Diagnosis not present

## 2015-07-29 ENCOUNTER — Encounter: Payer: Self-pay | Admitting: Internal Medicine

## 2015-08-12 DIAGNOSIS — I129 Hypertensive chronic kidney disease with stage 1 through stage 4 chronic kidney disease, or unspecified chronic kidney disease: Secondary | ICD-10-CM | POA: Diagnosis not present

## 2015-08-12 DIAGNOSIS — N183 Chronic kidney disease, stage 3 (moderate): Secondary | ICD-10-CM | POA: Diagnosis not present

## 2015-08-30 DIAGNOSIS — M859 Disorder of bone density and structure, unspecified: Secondary | ICD-10-CM | POA: Diagnosis not present

## 2015-08-30 DIAGNOSIS — M810 Age-related osteoporosis without current pathological fracture: Secondary | ICD-10-CM | POA: Diagnosis not present

## 2015-09-07 ENCOUNTER — Encounter: Payer: Self-pay | Admitting: Internal Medicine

## 2015-09-07 ENCOUNTER — Ambulatory Visit (INDEPENDENT_AMBULATORY_CARE_PROVIDER_SITE_OTHER): Payer: Medicare Other | Admitting: Internal Medicine

## 2015-09-07 VITALS — BP 130/72 | HR 95 | Ht 66.0 in | Wt 317.0 lb

## 2015-09-07 DIAGNOSIS — Z950 Presence of cardiac pacemaker: Secondary | ICD-10-CM

## 2015-09-07 DIAGNOSIS — I4819 Other persistent atrial fibrillation: Secondary | ICD-10-CM

## 2015-09-07 DIAGNOSIS — I481 Persistent atrial fibrillation: Secondary | ICD-10-CM | POA: Diagnosis not present

## 2015-09-07 LAB — CUP PACEART INCLINIC DEVICE CHECK
Battery Remaining Longevity: 102
Battery Voltage: 2.93 V
Brady Statistic RV Percent Paced: 99.8 %
Date Time Interrogation Session: 20161213123705
Implantable Lead Implant Date: 20001110
Implantable Lead Location: 753860
Lead Channel Pacing Threshold Pulse Width: 0.4 ms
Lead Channel Setting Pacing Pulse Width: 0.4 ms
Lead Channel Setting Sensing Sensitivity: 4 mV
MDC IDC MSMT LEADCHNL RV IMPEDANCE VALUE: 612.5 Ohm
MDC IDC MSMT LEADCHNL RV PACING THRESHOLD AMPLITUDE: 0.625 V
MDC IDC MSMT LEADCHNL RV SENSING INTR AMPL: 12 mV
MDC IDC SET LEADCHNL RV PACING AMPLITUDE: 0.875
Pulse Gen Serial Number: 7171350

## 2015-09-07 NOTE — Progress Notes (Signed)
HPI [name] [name] returns today for followup. 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 She has hypertension, and has refused to take any anticoagulation other than aspirin for the past 13 years. She continues to have chronic peripheral edema and venous insufficiency. She has class II systolic/diastolic heart failure symptoms. She has developed worsening renal insufficiency with a serum creatinine approaching 2. She has lost some weight in the past year. Allergies  Allergen Reactions  . Ciprofloxacin Hives    REACTION: hives  . Codeine Anaphylaxis    REACTION: throat swelling  . Contrast Media [Iodinated Diagnostic Agents] Other (See Comments)    Patient states "I have chronic kidney disease so the doctor said no dye in my veins."  . Coreg [Carvedilol] Other (See Comments)    Beta Blockers cause her organs to shut down per patient  . Latex Dermatitis    REACTION: rash, itching  . Penicillins Hives    REACTION: hives  . Peppermint Oil Shortness Of Breath  . Prednisone Anaphylaxis    REACTION: swelling, S.O.B.  . Tape Dermatitis    States plastic tape blisters her skin     Current Outpatient Prescriptions  Medication Sig Dispense Refill  . allopurinol (ZYLOPRIM) 300 MG tablet Take 300 mg by mouth daily.      Marland Kitchen aspirin 325 MG tablet Take 325 mg by mouth daily.      . calcitRIOL (ROCALTROL) 0.25 MCG capsule Take 0.25 mcg by mouth daily.    . Calcium Carbonate (CALCIUM 600 PO) Take 1 tablet by mouth daily.    . COD LIVER OIL PO Take 1 tablet by mouth daily.      . colchicine-probenecid 0.5-500 MG per tablet Take 1 tablet by mouth 2 (two) times daily. Gout flare up    . Cyanocobalamin (VITAMIN B 12 PO) Place 1 tablet under the tongue every morning. Patient takes 500 mg by mouth daily    . docusate sodium (COLACE) 100 MG capsule Take 100  mg by mouth daily as needed for mild constipation.    . ferrous sulfate 325 (65 FE) MG tablet Take 325 mg by mouth daily with breakfast.    . furosemide (LASIX) 40 MG tablet Take 1.5 tablets (60 mg total) by mouth 2 (two) times daily. 90 tablet 11  . hydrALAZINE (APRESOLINE) 25 MG tablet Take 0.5 tablets (12.5 mg total) by mouth 3 (three) times daily. 45 tablet 11  . HYDROcodone-acetaminophen (NORCO) 10-325 MG per tablet Take 1 tablet by mouth 3 (three) times daily.      . isosorbide mononitrate (IMDUR) 30 MG 24 hr tablet Take 1 tablet (30 mg total) by mouth daily. 30 tablet 11  . Multiple Vitamin (MULTIVITAMIN) tablet Take 1 tablet by mouth daily.    Marland Kitchen omeprazole (PRILOSEC) 20 MG capsule Take 20 mg by mouth daily.      . potassium chloride SA (K-DUR,KLOR-CON) 20 MEQ tablet Take 1 tablet (20 mEq total) by mouth 2 (two) times daily. 60 tablet 9   No current facility-administered medications for this visit.     Past Medical History  Diagnosis Date  . Chronic systolic congestive heart failure (HCC)     LVEF 25-30% with restrictive diastolic filling  . Atrial fibrillation ([place])   . Essential hypertension   . Bursitis   . GERD (gastroesophageal reflux disease)   . Cancer ([place])   . Cataract   .  Gallstones   . Kidney stones   . Sleep apnea     CPAP  . Morbid obesity ([place])   . Chronic cellulitis   . Degenerative joint disease   . Gout   . Diverticulitis   . Anemia   . Sinoatrial node dysfunction (HCC)     Status post PPM - Dr. Lovena Le    ROS:   All systems reviewed and negative except as noted in the HPI.   Past Surgical History  Procedure Laterality Date  . Abdominal hysterectomy  1991  . Pacemaker insertion  Nov 2000    [place] with revision in 2011  . Eye surgery    . Percutaneous nephrolithotomy  April 2012     Family History  Problem Relation Age of Onset  . Heart attack Father   . Diabetes Sister   . Stroke Mother      Social History   Social History  .  Marital Status: Widowed    Spouse Name: N/A  . Number of Children: N/A  . Years of Education: N/A   Occupational History  . Not on file.   Social History Main Topics  . Smoking status: Former Smoker -- 1.00 packs/day    Types: Cigarettes    Quit date: 09/26/1979  . Smokeless tobacco: Never Used  . Alcohol Use: No  . Drug Use: No  . Sexual Activity: No   Other Topics Concern  . Not on file   Social History Narrative     BP 130/72 mmHg  Pulse 95  Ht 5' 6"  (1.676 m)  Wt 317 lb (143.79 kg)  BMI 51.19 kg/m2  SpO2 92%  Physical Exam:  Morbidly obese appearing middle-aged woman, NAD HEENT: Unremarkable Neck:  7 cm JVD, no thyromegally Lungs:  Clear with no wheezes, rales, or rhonchi. HEART:  Regular rate rhythm, no murmurs, no rubs, no clicks Abd:  soft, positive bowel sounds, no organomegally, no rebound, no guarding Ext:  2 plus pulses, 2+ peripheral edema, no cyanosis, no clubbing Skin:  No rashes no nodules Neuro:  CN II through XII intact, motor grossly intact  DEVICE  Normal device function.  See PaceArt for details.   Assess/Plan:

## 2015-09-07 NOTE — Patient Instructions (Signed)
Your physician wants you to follow-up in: 1 Year with Dr. Lovena Le. You will receive a reminder letter in the mail two months in advance. If you don't receive a letter, please call our office to schedule the follow-up appointment.  Your physician recommends that you schedule a follow-up appointment in: [facility] 6 Months.   Your physician recommends that you continue on your current medications as directed. Please refer to the Current Medication list given to you today.  If you need a refill on your cardiac medications before your next appointment, please call your pharmacy.  Thank you for choosing [facility]!

## 2015-09-07 NOTE — Assessment & Plan Note (Signed)
She has lost some weight and I have encouraged her in this regard. She is limited in her weight loss by severe arthritis.

## 2015-09-07 NOTE — Assessment & Plan Note (Signed)
Her [employer] single chamber PPM is working normally. Will recheck in several months.

## 2015-10-14 DIAGNOSIS — M79642 Pain in left hand: Secondary | ICD-10-CM | POA: Diagnosis not present

## 2015-10-14 DIAGNOSIS — M25562 Pain in left knee: Secondary | ICD-10-CM | POA: Diagnosis not present

## 2015-10-14 DIAGNOSIS — M25511 Pain in right shoulder: Secondary | ICD-10-CM | POA: Diagnosis not present

## 2015-10-14 DIAGNOSIS — M109 Gout, unspecified: Secondary | ICD-10-CM | POA: Diagnosis not present

## 2015-10-14 DIAGNOSIS — L309 Dermatitis, unspecified: Secondary | ICD-10-CM | POA: Diagnosis not present

## 2015-10-14 DIAGNOSIS — M79641 Pain in right hand: Secondary | ICD-10-CM | POA: Diagnosis not present

## 2015-10-14 DIAGNOSIS — M25561 Pain in right knee: Secondary | ICD-10-CM | POA: Diagnosis not present

## 2015-11-17 ENCOUNTER — Other Ambulatory Visit: Payer: Self-pay | Admitting: *Deleted

## 2015-11-17 MED ORDER — HYDRALAZINE HCL 25 MG PO TABS: 12.5000 mg | ORAL_TABLET | Freq: Three times a day (TID) | ORAL | Status: DC

## 2015-11-18 ENCOUNTER — Other Ambulatory Visit: Payer: Self-pay | Admitting: *Deleted

## 2015-11-18 MED ORDER — ISOSORBIDE MONONITRATE ER 30 MG PO TB24: 30.0000 mg | ORAL_TABLET | Freq: Every day | ORAL | Status: DC

## 2016-01-17 DIAGNOSIS — M109 Gout, unspecified: Secondary | ICD-10-CM | POA: Diagnosis not present

## 2016-01-17 DIAGNOSIS — N189 Chronic kidney disease, unspecified: Secondary | ICD-10-CM | POA: Diagnosis not present

## 2016-01-17 DIAGNOSIS — M1712 Unilateral primary osteoarthritis, left knee: Secondary | ICD-10-CM | POA: Diagnosis not present

## 2016-01-17 DIAGNOSIS — M1711 Unilateral primary osteoarthritis, right knee: Secondary | ICD-10-CM | POA: Diagnosis not present

## 2016-01-17 DIAGNOSIS — M25511 Pain in right shoulder: Secondary | ICD-10-CM | POA: Diagnosis not present

## 2016-02-08 DIAGNOSIS — M1990 Unspecified osteoarthritis, unspecified site: Secondary | ICD-10-CM | POA: Diagnosis not present

## 2016-02-08 DIAGNOSIS — G4733 Obstructive sleep apnea (adult) (pediatric): Secondary | ICD-10-CM | POA: Diagnosis not present

## 2016-02-08 DIAGNOSIS — N2581 Secondary hyperparathyroidism of renal origin: Secondary | ICD-10-CM | POA: Diagnosis not present

## 2016-02-08 DIAGNOSIS — M109 Gout, unspecified: Secondary | ICD-10-CM | POA: Diagnosis not present

## 2016-02-08 DIAGNOSIS — N200 Calculus of kidney: Secondary | ICD-10-CM | POA: Diagnosis not present

## 2016-02-08 DIAGNOSIS — I129 Hypertensive chronic kidney disease with stage 1 through stage 4 chronic kidney disease, or unspecified chronic kidney disease: Secondary | ICD-10-CM | POA: Diagnosis not present

## 2016-02-08 DIAGNOSIS — N183 Chronic kidney disease, stage 3 (moderate): Secondary | ICD-10-CM | POA: Diagnosis not present

## 2016-02-08 DIAGNOSIS — Z950 Presence of cardiac pacemaker: Secondary | ICD-10-CM | POA: Diagnosis not present

## 2016-02-08 DIAGNOSIS — R609 Edema, unspecified: Secondary | ICD-10-CM | POA: Diagnosis not present

## 2016-02-08 DIAGNOSIS — I480 Paroxysmal atrial fibrillation: Secondary | ICD-10-CM | POA: Diagnosis not present

## 2016-02-08 DIAGNOSIS — D631 Anemia in chronic kidney disease: Secondary | ICD-10-CM | POA: Diagnosis not present

## 2016-02-22 DIAGNOSIS — N390 Urinary tract infection, site not specified: Secondary | ICD-10-CM | POA: Diagnosis not present

## 2016-03-13 ENCOUNTER — Encounter: Payer: Self-pay | Admitting: Internal Medicine

## 2016-03-13 ENCOUNTER — Ambulatory Visit (INDEPENDENT_AMBULATORY_CARE_PROVIDER_SITE_OTHER): Payer: Medicare Other | Admitting: *Deleted

## 2016-03-13 DIAGNOSIS — I481 Persistent atrial fibrillation: Secondary | ICD-10-CM | POA: Diagnosis not present

## 2016-03-13 DIAGNOSIS — I4819 Other persistent atrial fibrillation: Secondary | ICD-10-CM

## 2016-03-13 LAB — CUP PACEART INCLINIC DEVICE CHECK
Battery Remaining Longevity: 91.2
Battery Voltage: 2.92 V
Brady Statistic RV Percent Paced: 99.67 %
Date Time Interrogation Session: 20170619113747
Lead Channel Impedance Value: 575 Ohm
Lead Channel Pacing Threshold Amplitude: 0.75 V
Lead Channel Pacing Threshold Amplitude: 0.75 V
Lead Channel Pacing Threshold Pulse Width: 0.4 ms
Lead Channel Sensing Intrinsic Amplitude: 12 mV
Lead Channel Setting Pacing Pulse Width: 0.4 ms
Lead Channel Setting Sensing Sensitivity: 4 mV
MDC IDC LEAD IMPLANT DT: 20001110
MDC IDC LEAD LOCATION: 753860
MDC IDC MSMT LEADCHNL RV PACING THRESHOLD PULSEWIDTH: 0.4 ms
MDC IDC SET LEADCHNL RV PACING AMPLITUDE: 0.875
Pulse Gen Model: 1210
Pulse Gen Serial Number: 7171350

## 2016-03-13 NOTE — Progress Notes (Signed)
Pacemaker check in clinic. Normal device function. Threshold, sensing, and impedance consistent with previous measurements. Device programmed to maximize longevity. No high ventricular rates noted. Device programmed at appropriate safety margins. Histogram distribution appropriate for patient activity level. Device programmed to optimize intrinsic conduction. Estimated longevity 7.6-8.2 years. Patient will follow up with GT/R in 6 months.

## 2016-04-05 ENCOUNTER — Emergency Department (HOSPITAL_COMMUNITY)
Admission: EM | Admit: 2016-04-05 | Discharge: 2016-04-05 | Disposition: A | Discharge status: Home / Self Care | Payer: Medicare Other | Attending: Emergency Medicine | Admitting: Emergency Medicine

## 2016-04-05 ENCOUNTER — Emergency Department (HOSPITAL_COMMUNITY): Payer: Medicare Other

## 2016-04-05 ENCOUNTER — Encounter (HOSPITAL_COMMUNITY): Payer: Self-pay

## 2016-04-05 DIAGNOSIS — K8020 Calculus of gallbladder without cholecystitis without obstruction: Secondary | ICD-10-CM | POA: Diagnosis not present

## 2016-04-05 DIAGNOSIS — M546 Pain in thoracic spine: Secondary | ICD-10-CM | POA: Diagnosis not present

## 2016-04-05 DIAGNOSIS — K7460 Unspecified cirrhosis of liver: Secondary | ICD-10-CM | POA: Diagnosis not present

## 2016-04-05 DIAGNOSIS — Z91040 Latex allergy status: Secondary | ICD-10-CM | POA: Diagnosis not present

## 2016-04-05 DIAGNOSIS — M545 Low back pain: Secondary | ICD-10-CM

## 2016-04-05 DIAGNOSIS — Z79899 Other long term (current) drug therapy: Secondary | ICD-10-CM | POA: Insufficient documentation

## 2016-04-05 DIAGNOSIS — Z859 Personal history of malignant neoplasm, unspecified: Secondary | ICD-10-CM | POA: Insufficient documentation

## 2016-04-05 DIAGNOSIS — N390 Urinary tract infection, site not specified: Secondary | ICD-10-CM | POA: Insufficient documentation

## 2016-04-05 DIAGNOSIS — Z7982 Long term (current) use of aspirin: Secondary | ICD-10-CM | POA: Insufficient documentation

## 2016-04-05 DIAGNOSIS — I110 Hypertensive heart disease with heart failure: Secondary | ICD-10-CM | POA: Insufficient documentation

## 2016-04-05 DIAGNOSIS — Z87891 Personal history of nicotine dependence: Secondary | ICD-10-CM | POA: Insufficient documentation

## 2016-04-05 DIAGNOSIS — Z950 Presence of cardiac pacemaker: Secondary | ICD-10-CM | POA: Insufficient documentation

## 2016-04-05 DIAGNOSIS — K5792 Diverticulitis of intestine, part unspecified, without perforation or abscess without bleeding: Secondary | ICD-10-CM | POA: Insufficient documentation

## 2016-04-05 DIAGNOSIS — I5022 Chronic systolic (congestive) heart failure: Secondary | ICD-10-CM | POA: Diagnosis not present

## 2016-04-05 LAB — CBC
HCT: 32.2 % — ABNORMAL LOW (ref 36.0–46.0)
Hemoglobin: 10.3 g/dL — ABNORMAL LOW (ref 12.0–15.0)
MCH: 31 pg (ref 26.0–34.0)
MCHC: 32 g/dL (ref 30.0–36.0)
MCV: 97 fL (ref 78.0–100.0)
PLATELETS: 147 10*3/uL — AB (ref 150–400)
RBC: 3.32 MIL/uL — AB (ref 3.87–5.11)
RDW: 16 % — AB (ref 11.5–15.5)
WBC: 5.1 10*3/uL (ref 4.0–10.5)

## 2016-04-05 LAB — BASIC METABOLIC PANEL
Anion gap: 9 (ref 5–15)
BUN: 22 mg/dL — AB (ref 6–20)
CALCIUM: 9.5 mg/dL (ref 8.9–10.3)
CO2: 27 mmol/L (ref 22–32)
CREATININE: 2 mg/dL — AB (ref 0.44–1.00)
Chloride: 102 mmol/L (ref 101–111)
GFR calc Af Amer: 28 mL/min — ABNORMAL LOW (ref >60)
GFR, EST NON AFRICAN AMERICAN: 24 mL/min — AB (ref >60)
Glucose, Bld: 85 mg/dL (ref 65–99)
Potassium: 4.3 mmol/L (ref 3.5–5.1)
SODIUM: 138 mmol/L (ref 135–145)

## 2016-04-05 LAB — URINALYSIS, ROUTINE W REFLEX MICROSCOPIC
Bilirubin Urine: NEGATIVE
Glucose, UA: NEGATIVE mg/dL
Hgb urine dipstick: NEGATIVE
Ketones, ur: NEGATIVE mg/dL
Nitrite: NEGATIVE
Protein, ur: NEGATIVE mg/dL
SPECIFIC GRAVITY, URINE: 1.015 (ref 1.005–1.030)
pH: 6 (ref 5.0–8.0)

## 2016-04-05 LAB — URINE MICROSCOPIC-ADD ON

## 2016-04-05 LAB — I-STAT TROPONIN, ED: TROPONIN I, POC: 0 ng/mL (ref 0.00–0.08)

## 2016-04-05 LAB — BRAIN NATRIURETIC PEPTIDE: B NATRIURETIC PEPTIDE 5: 151.9 pg/mL — AB (ref 0.0–100.0)

## 2016-04-05 MED ORDER — CEPHALEXIN 500 MG PO CAPS: 500.0000 mg | ORAL_CAPSULE | Freq: Three times a day (TID) | ORAL | Status: DC

## 2016-04-05 NOTE — ED Provider Notes (Signed)
CSN: [id number]     Arrival date & time 04/05/16  1507 History   First MD Initiated Contact with Patient 04/05/16 1729     Chief Complaint  Patient presents with  . Back Pain  . Body pain/weakness      (Consider location/radiation/quality/duration/timing/severity/associated sxs/prior Treatment) Patient is a 70 y.o. female presenting with back pain. The history is provided by the patient.  Back Pain Location:  Lumbar spine Quality:  Aching Radiates to:  Does not radiate Pain severity:  Moderate Pain is:  Same all the time Onset quality:  Gradual Duration:  1 month Timing:  Constant Progression:  Worsening Chronicity:  New Context: not recent injury and not twisting   Relieved by:  Nothing Worsened by:  Nothing tried Ineffective treatments:  None tried Associated symptoms: weight loss (feels like clothes have been looser)   Associated symptoms: no abdominal pain, no bladder incontinence, no bowel incontinence and no fever   Associated symptoms comment:  Occasional night sweats Risk factors: hx of cancer (remote cervical, treated surgically), lack of exercise and obesity     Past Medical History  Diagnosis Date  . Chronic systolic congestive heart failure (HCC)     LVEF 25-30% with restrictive diastolic filling  . Atrial fibrillation ([place])   . Essential hypertension   . Bursitis   . GERD (gastroesophageal reflux disease)   . Cancer ([place])   . Cataract   . Gallstones   . Kidney stones   . Sleep apnea     CPAP  . Morbid obesity ([place])   . Chronic cellulitis   . Degenerative joint disease   . Gout   . Diverticulitis   . Anemia   . Sinoatrial node dysfunction (HCC)     Status post PPM - Dr. Lovena Le   Past Surgical History  Procedure Laterality Date  . Abdominal hysterectomy  1991  . Pacemaker insertion  Nov 2000    Cool with revision in 2011  . Eye surgery    . Percutaneous nephrolithotomy  April 2012   Family History  Problem Relation Age of Onset  . Heart  attack Father   . Diabetes Sister   . Stroke Mother    Social History  Substance Use Topics  . Smoking status: Former Smoker -- 1.00 packs/day    Types: Cigarettes    Quit date: 09/26/1979  . Smokeless tobacco: Never Used  . Alcohol Use: No   OB History    No data available     Review of Systems  Constitutional: Positive for weight loss (feels like clothes have been looser) and appetite change (loss of appetitie over last month). Negative for fever.  Gastrointestinal: Negative for abdominal pain and bowel incontinence.  Genitourinary: Negative for bladder incontinence.  Musculoskeletal: Positive for back pain.  All other systems reviewed and are negative.     Allergies  Ciprofloxacin; Codeine; Contrast media; Coreg; Latex; Penicillins; Peppermint oil; Prednisone; and Tape  Home Medications   Prior to Admission medications   Medication Sig Start Date End Date Taking? Authorizing Provider  allopurinol (ZYLOPRIM) 300 MG tablet Take 300 mg by mouth daily.      Historical Provider, MD  aspirin 325 MG tablet Take 325 mg by mouth daily.      Historical Provider, MD  calcitRIOL (ROCALTROL) 0.25 MCG capsule Take 0.25 mcg by mouth daily.    Historical Provider, MD  Calcium Carbonate (CALCIUM 600 PO) Take 1 tablet by mouth daily.    Historical Provider, MD  COD LIVER OIL PO Take 1 tablet by mouth daily.      Historical Provider, MD  colchicine-probenecid 0.5-500 MG per tablet Take 1 tablet by mouth 2 (two) times daily. Reported on 03/13/2016 02/24/13   Historical Provider, MD  Cyanocobalamin (VITAMIN B 12 PO) Place 1 tablet under the tongue every morning. Patient takes 500 mg by mouth daily    Historical Provider, MD  docusate sodium (COLACE) 100 MG capsule Take 100 mg by mouth daily as needed for mild constipation.    Historical Provider, MD  ferrous sulfate 325 (65 FE) MG tablet Take 325 mg by mouth daily with breakfast.    Historical Provider, MD  furosemide (LASIX) 40 MG tablet Take  1.5 tablets (60 mg total) by mouth 2 (two) times daily. Patient taking differently: Take 80 mg by mouth 2 (two) times daily.  03/26/15   Fay Records, MD  hydrALAZINE (APRESOLINE) 25 MG tablet Take 0.5 tablets (12.5 mg total) by mouth 3 (three) times daily. 11/17/15   Fay Records, MD  HYDROcodone-acetaminophen [facility]) 10-325 MG per tablet Take 1 tablet by mouth 3 (three) times daily.      Historical Provider, MD  isosorbide mononitrate (IMDUR) 30 MG 24 hr tablet Take 1 tablet (30 mg total) by mouth daily. 11/18/15   Fay Records, MD  Multiple Vitamin (MULTIVITAMIN) tablet Take 1 tablet by mouth daily.    Historical Provider, MD  omeprazole (PRILOSEC) 20 MG capsule Take 20 mg by mouth daily.      Historical Provider, MD  potassium chloride SA (K-DUR,KLOR-CON) 20 MEQ tablet Take 1 tablet (20 mEq total) by mouth 2 (two) times daily. 07/16/15   Fay Records, MD  Vitamin D, Ergocalciferol, (DRISDOL) 50000 units CAPS capsule Take 50,000 Units by mouth every 7 (seven) days.    Historical Provider, MD   BP 140/75 mmHg  Pulse 97  Temp(Src) 98.5 F (36.9 C) (Oral)  Resp 21  Ht 5' 6"  (1.676 m)  Wt 317 lb (143.79 kg)  BMI 51.19 kg/m2  SpO2 99% Physical Exam  Constitutional: She is oriented to person, place, and time. She appears well-developed and well-nourished. No distress.  HENT:  Head: Normocephalic.  Eyes: Conjunctivae are normal.  Neck: Neck supple. No tracheal deviation present.  Cardiovascular: Normal rate and regular rhythm.   Pulmonary/Chest: Effort normal. No respiratory distress.  Abdominal: Soft. She exhibits no distension. There is no tenderness. There is no rigidity, no rebound and no guarding.  Musculoskeletal:       Lumbar back: She exhibits tenderness (diffuse lower).  Neurological: She is alert and oriented to person, place, and time.  Skin: Skin is warm and dry.  Psychiatric: She has a normal mood and affect.  Vitals reviewed.   ED Course  Procedures (including critical care  time) Labs Review Labs Reviewed  BASIC METABOLIC PANEL - Abnormal; Notable for the following:    BUN 22 (*)    Creatinine, Ser 2.00 (*)    GFR calc non Af Amer 24 (*)    GFR calc Af Amer 28 (*)    All other components within normal limits  CBC - Abnormal; Notable for the following:    RBC 3.32 (*)    Hemoglobin 10.3 (*)    HCT 32.2 (*)    RDW 16.0 (*)    Platelets 147 (*)    All other components within normal limits  BRAIN NATRIURETIC PEPTIDE - Abnormal; Notable for the following:    B Natriuretic Peptide  151.9 (*)    All other components within normal limits  URINALYSIS, ROUTINE W REFLEX MICROSCOPIC (NOT AT [facility]) - Abnormal; Notable for the following:    Leukocytes, UA SMALL (*)    All other components within normal limits  URINE MICROSCOPIC-ADD ON - Abnormal; Notable for the following:    Squamous Epithelial / LPF 0-5 (*)    Bacteria, UA FEW (*)    All other components within normal limits  URINE CULTURE  I-STAT TROPOININ, ED    Imaging Review Dg Chest 2 View  04/05/2016  CLINICAL DATA:  Mid back pain around the kidneys. EXAM: CHEST  2 VIEW COMPARISON:  02/25/2015 FINDINGS: There is mild bilateral interstitial thickening. There is no focal consolidation. There is no pleural effusion or pneumothorax. There is massive cardiomegaly. There is a single lead AICD. The osseous structures are unremarkable. IMPRESSION: Cardiomegaly with mild pulmonary vascular congestion. Electronically Signed   By: Kathreen Devoid   On: 04/05/2016 16:48   I have personally reviewed and evaluated these images and lab results as part of my medical decision-making.   EKG Interpretation None      MDM   Final diagnoses:  Urinary tract infection without hematuria, site unspecified  Low back pain without sciatica, unspecified back pain laterality   70 y.o. female presents with low back pain in setting of chronic arthritis that has been progressive with limited mobility. Has developed chills and  generalized weakness and body pain. Screening CT for oncologic process is negative for acute findings. i reviewed cirrhosis, hernia, appendiceal stone with pt as incidental findings to be followed by PCP. 6-30 leukocytes in urine suggestive of UTI as potential etiology. Will treat empirically with keflex pending culture. Plan to follow up with PCP as needed and return precautions discussed for worsening or new concerning symptoms.     Leo Grosser, MD 04/06/16 [id number]

## 2016-04-05 NOTE — Discharge Instructions (Signed)

## 2016-04-05 NOTE — ED Notes (Addendum)
Patient here with lower back pain x 4 weeks, also complains of generalized body pain and chills with same. NAD. Pain with any movement. Denies injury

## 2016-04-07 LAB — URINE CULTURE

## 2016-04-14 DIAGNOSIS — N390 Urinary tract infection, site not specified: Secondary | ICD-10-CM | POA: Diagnosis not present

## 2016-04-14 DIAGNOSIS — R109 Unspecified abdominal pain: Secondary | ICD-10-CM | POA: Diagnosis not present

## 2016-04-14 DIAGNOSIS — D649 Anemia, unspecified: Secondary | ICD-10-CM | POA: Diagnosis not present

## 2016-04-14 DIAGNOSIS — K7460 Unspecified cirrhosis of liver: Secondary | ICD-10-CM | POA: Diagnosis not present

## 2016-04-14 DIAGNOSIS — R110 Nausea: Secondary | ICD-10-CM | POA: Diagnosis not present

## 2016-04-17 ENCOUNTER — Encounter: Payer: Self-pay | Admitting: Gastroenterology

## 2016-04-20 DIAGNOSIS — M818 Other osteoporosis without current pathological fracture: Secondary | ICD-10-CM | POA: Diagnosis not present

## 2016-04-20 DIAGNOSIS — M25562 Pain in left knee: Secondary | ICD-10-CM | POA: Diagnosis not present

## 2016-04-20 DIAGNOSIS — M1712 Unilateral primary osteoarthritis, left knee: Secondary | ICD-10-CM | POA: Diagnosis not present

## 2016-04-20 DIAGNOSIS — M1711 Unilateral primary osteoarthritis, right knee: Secondary | ICD-10-CM | POA: Diagnosis not present

## 2016-04-20 DIAGNOSIS — M109 Gout, unspecified: Secondary | ICD-10-CM | POA: Diagnosis not present

## 2016-04-20 DIAGNOSIS — M25561 Pain in right knee: Secondary | ICD-10-CM | POA: Diagnosis not present

## 2016-05-11 ENCOUNTER — Other Ambulatory Visit: Payer: Self-pay | Admitting: *Deleted

## 2016-05-11 MED ORDER — ISOSORBIDE MONONITRATE ER 30 MG PO TB24: 30.0000 mg | ORAL_TABLET | Freq: Every day | ORAL | 0 refills | Status: DC

## 2016-05-19 ENCOUNTER — Ambulatory Visit: Payer: Medicare Other | Admitting: Gastroenterology

## 2016-06-13 ENCOUNTER — Ambulatory Visit (INDEPENDENT_AMBULATORY_CARE_PROVIDER_SITE_OTHER): Payer: Medicare Other | Admitting: Gastroenterology

## 2016-06-13 ENCOUNTER — Encounter: Payer: Self-pay | Admitting: Gastroenterology

## 2016-06-13 DIAGNOSIS — K7460 Unspecified cirrhosis of liver: Secondary | ICD-10-CM

## 2016-06-13 DIAGNOSIS — K7581 Nonalcoholic steatohepatitis (NASH): Secondary | ICD-10-CM

## 2016-06-13 DIAGNOSIS — R188 Other ascites: Principal | ICD-10-CM

## 2016-06-13 NOTE — Patient Instructions (Signed)
1. Please have your labs done at [place] do not have to fast for these labs.  2. You will need an upper endoscopy to screen for blood vessels around your esophagus (seen in cirrhosis). I will touch base with your other doctors to see if you will be safe for endoscopy. 3. Please try to eat a well balanced diet. You need to make sure you get several portions of protein each day.  4. Return to the office in 2 months to see Dr. Oneida Alar. Call sooner if needed.   Cirrhosis Cirrhosis is long-term (chronic) liver injury. The liver is your largest internal organ, and it performs many functions. The liver converts food into energy, removes toxic material from your blood, makes important proteins, and absorbs necessary vitamins from your diet. If you have cirrhosis, it means many of your healthy liver cells have been replaced by scar tissue. This prevents blood from flowing through your liver, which makes it difficult for your liver to function. This scarring is not reversible, but treatment can prevent it from getting worse.  CAUSES  Hepatitis C and long-term alcohol abuse are the most common causes of cirrhosis. Other causes include:  Nonalcoholic fatty liver disease.  Hepatitis B infection.  Autoimmune hepatitis.  Diseases that cause blockage of ducts inside the liver.  Inherited liver diseases.  Reactions to certain long-term medicines.  Parasitic infections.  Long-term exposure to certain toxins. RISK FACTORS You may have a higher risk of cirrhosis if you:  Have certain hepatitis viruses.  Abuse alcohol, especially if you are female.  Are overweight.  Share needles.  Have unprotected sex with someone who has hepatitis. SYMPTOMS  You may not have any signs and symptoms at first. Symptoms may not develop until the damage to your liver starts to get worse. Signs and symptoms of cirrhosis may include:   Tenderness in the right-upper part of your abdomen.  Weakness and tiredness  (fatigue).  Loss of appetite.  Nausea.  Weight loss and muscle loss.  Itchiness.  Yellow skin and eyes (jaundice).  Buildup of fluid in the abdomen (ascites).  Swelling of the feet and ankles (edema).  Appearance of tiny blood vessels under the skin.  Mental confusion.  Easy bruising and bleeding. DIAGNOSIS  Your health care provider may suspect cirrhosis based on your symptoms and medical history, especially if you have other medical conditions or a history of alcohol abuse. Your health care provider will do a physical exam to feel your liver and check for signs of cirrhosis. Your health care provider may perform other tests, including:   Blood tests to check:   Whether you have hepatitis B or C.   Kidney function.  Liver function.  Imaging tests such as:  MRI or CT scan to look for changes seen in advanced cirrhosis.  Ultrasound to see if normal liver tissue is being replaced by scar tissue.  A procedure using a long needle to take a sample of liver tissue (biopsy) for examination under a microscope. Liver biopsy can confirm the diagnosis of cirrhosis.  TREATMENT  Treatment depends on how damaged your liver is and what caused the damage. Treatment may include treating cirrhosis symptoms or treating the underlying causes of the condition to try to slow the progression of the damage. Treatment may include:  Making lifestyle changes, such as:   Eating a healthy diet.  Restricting salt intake.  Maintaining a healthy weight.   Not abusing drugs or alcohol.  Taking medicines to:  Treat  liver infections or other infections.  Control itching.  Reduce fluid buildup.  Reduce certain blood toxins.  Reduce risk of bleeding from enlarged blood vessels in the stomach or esophagus (varices).  If varices are causing bleeding problems, you may need treatment with a procedure that ties up the vessels causing them to fall off (band ligation).  If cirrhosis is  causing your liver to fail, your health care provider may recommend a liver transplant.  Other treatments may be recommended depending on any complications of cirrhosis, such as liver-related kidney failure (hepatorenal syndrome). HOME CARE INSTRUCTIONS   Take medicines only as directed by your health care provider. Do not use drugs that are toxic to your liver. Ask your health care provider before taking any new medicines, including over-the-counter medicines.   Rest as needed.  Eat a well-balanced diet. Ask your health care provider or dietitian for more information.   You may have to follow a low-salt diet or restrict your water intake as directed.  Do not drink alcohol. This is especially important if you are taking acetaminophen.  Keep all follow-up visits as directed by your health care provider. This is important. SEEK MEDICAL CARE IF:  You have fatigue or weakness that is getting worse.  You develop swelling of the hands, feet, legs, or face.  You have a fever.  You develop loss of appetite.  You have nausea or vomiting.  You develop jaundice.  You develop easy bruising or bleeding. SEEK IMMEDIATE MEDICAL CARE IF:  You vomit bright red blood or a material that looks like coffee grounds.  You have blood in your stools.  Your stools appear black and tarry.  You become confused.  You have chest pain or trouble breathing.   This information is not intended to replace advice given to you by your health care provider. Make sure you discuss any questions you have with your health care provider.   Document Released: 09/11/2005 Document Revised: 10/02/2014 Document Reviewed: 05/20/2014 Elsevier Interactive Patient Education [employer].

## 2016-06-13 NOTE — Progress Notes (Signed)
Primary Care Physician:  [name], MD  Primary Gastroenterologist:  Barney Drain, MD   Chief Complaint  Patient presents with  . Cirrhosis    HPI:  [name] [name] is a 70 y.o. female here At the request of PCP for further evaluation of cirrhosis. Incidentally discovered at time of recent noncontrast CT back in July. Liver border noted to be irregular. 6 mm stone present in the gallbladder neck without inflammatory changes of the gallbladder. Spleen normal in size. Focal density present within the appendix, possibly a stone but no inflammatory changes. Portion of colon herniates through ventral hernia without obstruction. 2 separate ventral hernias which portions of the colon extruded. Diffuse abdominal ascites present. Extensive edema within the soft tissues compatible with anasarca. Stable size of exophytic lesion in the upper pole of the right kidney with increased density.  Patient is morbidly obese. 5 years ago she states she weighed at 380 pounds. She's been running in the 310-320 range over the past year. She reports her appetite is not really that good. She developed nausea on Fosamax and had to be stopped. She denies dysphagia or heartburn. Bowels move only 1-2 times per week but she states they are soft. She consumes premiums to assist in her bowel function. She notes that her kidney doctor has her on Lasix 80 mg twice daily. She sees Dr. Olivia Canter, [facility], [phone]. She denies any abdominal pain. She states that her abdominal distention, lower extremity edema have all improved since her ER visit acting July.     Current Outpatient Prescriptions  Medication Sig Dispense Refill  . alendronate (FOSAMAX) 70 MG tablet Take 70 mg by mouth every Sunday.  3  . allopurinol (ZYLOPRIM) 300 MG tablet Take 300 mg by mouth daily.      Marland Kitchen aspirin 325 MG tablet Take 325 mg by mouth daily.      . calcitRIOL (ROCALTROL) 0.25 MCG capsule Take 0.25 mcg by mouth daily.    .  COD LIVER OIL PO Take 1 tablet by mouth daily.      Marland Kitchen docusate sodium (COLACE) 100 MG capsule Take 100 mg by mouth daily as needed for mild constipation.    . ferrous sulfate 325 (65 FE) MG tablet Take 325 mg by mouth every evening.     . furosemide (LASIX) 40 MG tablet Take 1.5 tablets (60 mg total) by mouth 2 (two) times daily. (Patient taking differently: Take 80 mg by mouth 2 (two) times daily. ) 90 tablet 11  . hydrALAZINE (APRESOLINE) 25 MG tablet Take 0.5 tablets (12.5 mg total) by mouth 3 (three) times daily. 45 tablet 11  . HYDROcodone-acetaminophen (NORCO) 10-325 MG per tablet Take 1 tablet by mouth 3 (three) times daily.      . isosorbide mononitrate (IMDUR) 30 MG 24 hr tablet Take 1 tablet (30 mg total) by mouth daily. 90 tablet 0  . Multiple Vitamin (MULTIVITAMIN) tablet Take 1 tablet by mouth daily.    Marland Kitchen omeprazole (PRILOSEC) 20 MG capsule Take 20 mg by mouth daily.      . potassium chloride SA (K-DUR,KLOR-CON) 20 MEQ tablet Take 1 tablet (20 mEq total) by mouth 2 (two) times daily. 60 tablet 9  . vitamin B-12 (CYANOCOBALAMIN) 500 MCG tablet Take 500 mcg by mouth daily.    . Calcium Carbonate (CALCIUM 600 PO) Take 1 tablet by mouth daily.    . cephALEXin (KEFLEX) 500 MG capsule Take 1 capsule (500 mg total) by mouth 3 (three) times daily. (Patient  not taking: Reported on 06/13/2016) 30 capsule 0   No current facility-administered medications for this visit.     Allergies as of 06/13/2016 - Review Complete 06/13/2016  Allergen Reaction Noted  . Cephalexin Shortness Of Breath 06/13/2016  . Codeine Anaphylaxis   . Contrast media [iodinated diagnostic agents] Other (See Comments) 10/30/2014  . Coreg [carvedilol] Other (See Comments) 11/12/2014  . Peppermint oil Shortness Of Breath 05/07/2011  . Prednisone Anaphylaxis, Shortness Of Breath, and Swelling   . Tape Other (See Comments) 03/06/2013  . Ciprofloxacin Hives   . Latex Itching and Rash   . Penicillins Hives     Past  Medical History:  Diagnosis Date  . Anemia   . Atrial fibrillation ([place])   . Bursitis   . Cancer ([place])   . Cataract   . Cervical cancer ([place]) 1991   s/p hysterectomy  . Chronic cellulitis   . Chronic systolic congestive heart failure (HCC)    LVEF 25-30% with restrictive diastolic filling  . Degenerative joint disease   . Diverticulitis   . Essential hypertension   . Gallstones   . GERD (gastroesophageal reflux disease)   . Gout   . Kidney stones   . Morbid obesity ([place])   . Osteoarthritis   . Sinoatrial node dysfunction (HCC)    Status post PPM - Dr. Lovena Le  . Sleep apnea    CPAP    Past Surgical History:  Procedure Laterality Date  . ABDOMINAL HYSTERECTOMY  1991  . COLONOSCOPY  1998   one polyp per patient  . EYE SURGERY    . PACEMAKER INSERTION  Nov 2000   St Jude with revision in 2011  . PERCUTANEOUS NEPHROLITHOTOMY  April 2012    Family History  Problem Relation Age of Onset  . Heart attack Father   . Stroke Mother   . Diabetes Sister   . Colon cancer Other     seven family members  . Liver disease Neg Hx   . Other Neg Hx     Social History   Social History  . Marital status: Widowed    Spouse name: N/A  . Number of children: N/A  . Years of education: N/A   Occupational History  . Not on file.   Social History Main Topics  . Smoking status: Former Smoker    Packs/day: 1.00    Types: Cigarettes    Quit date: 09/26/1979  . Smokeless tobacco: Never Used  . Alcohol use No  . Drug use: No  . Sexual activity: No   Other Topics Concern  . Not on file   Social History Narrative  . No narrative on file      ROS:  General: Negative for  fever, chills, see hpi. She uses motorized scooter to get around. Limited standing/walking Eyes: Negative for vision changes.  ENT: Negative for hoarseness, difficulty swallowing , nasal congestion. CV: Negative for chest pain, angina, palpitations, dyspnea on exertion, +peripheral edema.  Respiratory:  Negative for dyspnea at rest, dyspnea on exertion, cough, sputum, wheezing.  GI: See history of present illness. GU:  Negative for dysuria, hematuria, urinary incontinence, urinary frequency, nocturnal urination.  MS: Negative for joint pain, +low back pain.  Derm: Negative for rash or itching.  Neuro: Negative for weakness, abnormal sensation, seizure, frequent headaches, memory loss, confusion.  Psych: Negative for anxiety, depression, suicidal ideation, hallucinations.  Endo: Negative for unusual weight change.  Heme: Negative for bruising or bleeding. Allergy: Negative for rash or hives.  Physical Examination:  BP (!) 147/73   Pulse (!) 101   Temp 97.6 F (36.4 C) (Oral)   Ht 5' 6"  (1.676 m)    General: morbidly obese elderly WF sitting in motorized chair. Accompanied by daughter.  Head: Normocephalic, atraumatic.   Eyes: Conjunctiva pink, no icterus. Mouth: Oropharyngeal mucosa moist and pink , no lesions erythema or exudate. Neck: Supple without thyromegaly, masses, or lymphadenopathy.  Lungs: Clear to auscultation bilaterally.  Heart: Regular rate and rhythm, no murmurs rubs or gallops.  Abdomen: Bowel sounds are normal, abd is massively obese. Very soft. No pitting edema. +ventral hernia, reducible. no hepatosplenomegaly or masses, no abdominal bruits, no rebound or guarding.  Exam limited by body habitus Rectal: not perfomred Extremities: chronic venous stasis changes, 2+lower extremity edema. No clubbing or deformities.  Neuro: Alert and oriented x 4 , grossly normal neurologically.  Skin: Warm and dry, no rash or jaundice.   Psych: Alert and cooperative, normal mood and affect.  Labs: Lab Results  Component Value Date   CREATININE 2.00 (H) 04/05/2016   BUN 22 (H) 04/05/2016   NA 138 04/05/2016   K 4.3 04/05/2016   CL 102 04/05/2016   CO2 27 04/05/2016   Lab Results  Component Value Date   WBC 5.1 04/05/2016   HGB 10.3 (L) 04/05/2016   HCT 32.2 (L)  04/05/2016   MCV 97.0 04/05/2016   PLT 147 (L) 04/05/2016       Imaging Studies:  CLINICAL DATA:  Back pain and nausea for several weeks.  EXAM: CT ABDOMEN AND PELVIS WITHOUT CONTRAST  TECHNIQUE: Multidetector CT imaging of the abdomen and pelvis was performed following the standard protocol without IV contrast.  COMPARISON:  CT of the abdomen and pelvis 02/08/2012.  FINDINGS: Lower chest: Mild dependent atelectasis is present bilaterally. Chronic pleural thickening is present on the right. The heart is enlarged. Pacing wires are in place.  Hepatobiliary: The liver border is irregular compatible with cirrhosis. No discrete lesions are present. A 6 mm stone is present at the gallbladder neck without inflammatory changes of the gallbladder. The common bile duct is within normal limits.  Pancreas: Pancreas is unremarkable.  Spleen: The spleen is within normal limits.  Adrenals/Urinary Tract: Adrenal glands are normal bilaterally. An exophytic lesion at the lower pole the right kidney measures 17 mm. This is of higher density than on the prior study. Two nonobstructing stones are present in the right kidney. Bilateral renal atrophy is noted. The ureters are within normal limits bilaterally. The urinary bladder is unremarkable.  Stomach/Bowel: The stomach and duodenum are within normal limits. The small bowel is unremarkable. A focal density within the appendix may represent a stone. There is no definite inflammatory changes about the appendix. The ascending and transverse colon are within normal limits. The descending and rectosigmoid colon are within normal limits. A portion of colon again herniates through ventral hernia without obstruction. There 2 separate ventral hernias through which portions of colon extruded. There is a larger amount of bowel within these collections thin previously-seen. There is some fluid within the hernias as  well.  Vascular/Lymphatic: Atherosclerotic calcifications are present without aneurysm. No significant adenopathy is present.  Reproductive: Unremarkable.  Other: Diffuse abdominal ascites is present. There is extensive edema within the soft tissues compatible with anasarca. Fluid is most prominent about the liver and spleen as well is within the small bowel mesenteries. As stated above, there is some fluid extending into the ventral hernias.  Musculoskeletal: Advanced degenerative  changes are present within the lumbar spine. Levoconvex curvature is centered at L2. Slight degenerative anterolisthesis is present at L4-5. No focal lytic or blastic lesions are present. Anterior osteophytes are fused in the lower thoracic spine compatible with DISH. The pelvis is intact.  IMPRESSION: 1. Hepatic cirrhosis with progressive diffuse abdominal ascites. 2. Extensive subcutaneous edema is present as well compatible with anasarca. 3. Two ventral hernias contain loops of transverse colon without obstruction. 4. Cholelithiasis without cholecystitis. 5. Question stone within the appendix. The appendix is not definitively inflamed. 6. Atherosclerosis. 7. Multilevel spondylosis of the lumbar spine. 8. Stable size of exophytic lesion at the upper pole of the right kidney with increased density.   Electronically Signed   By: San Morelle M.D.   On: 04/05/2016 21:27

## 2016-06-16 ENCOUNTER — Encounter: Payer: Self-pay | Admitting: Gastroenterology

## 2016-06-16 NOTE — Assessment & Plan Note (Addendum)
70 year old female incidentally found to have cirrhosis at time of CT imaging (noncontrast) which was done for back pain and nausea. She is morbidly obese. Suspect cirrhosis related to [place]. Other etiologies need to be considered however. Limited labs to be performed. She tells me her anasarca has improved dramatically. She has some edema in the lower extremities to the knees, abdomen is massively obese with a very soft. Discussed need to adhere to a 2 g sodium diet. She is getting very limited protein in her diet if any and we have recommended that she have several small portions protein daily. Stools are soft but not very frequent, would consider adding lactulose at some point in the near future. At this point there is been no signs of encephalopathy. She will need to have upper endoscopy for variceal screening. She has multiple comorbidities therefore would like to have cardiology clearance. We will plan to see patient back in 2 months with Dr. Oneida Alar, in the interim labs and possible EGD.  Please note, patient declines further colonoscopies.

## 2016-06-18 LAB — PROTIME-INR
INR: 1 (ref 0.8–1.2)
PROTHROMBIN TIME: 10.7 s (ref 9.1–12.0)

## 2016-06-18 LAB — CBC WITH DIFFERENTIAL/PLATELET
BASOS ABS: 0 10*3/uL (ref 0.0–0.2)
Basos: 1 %
EOS (ABSOLUTE): 0.6 10*3/uL — AB (ref 0.0–0.4)
Eos: 9 %
Hematocrit: 33.3 % — ABNORMAL LOW (ref 34.0–46.6)
Hemoglobin: 10.9 g/dL — ABNORMAL LOW (ref 11.1–15.9)
Immature Grans (Abs): 0 10*3/uL (ref 0.0–0.1)
Immature Granulocytes: 0 %
LYMPHS ABS: 1.5 10*3/uL (ref 0.7–3.1)
LYMPHS: 23 %
MCH: 30.3 pg (ref 26.6–33.0)
MCHC: 32.7 g/dL (ref 31.5–35.7)
MCV: 93 fL (ref 79–97)
MONOS ABS: 0.5 10*3/uL (ref 0.1–0.9)
Monocytes: 8 %
NEUTROS ABS: 3.9 10*3/uL (ref 1.4–7.0)
Neutrophils: 59 %
PLATELETS: 180 10*3/uL (ref 150–379)
RBC: 3.6 x10E6/uL — ABNORMAL LOW (ref 3.77–5.28)
RDW: 16.1 % — AB (ref 12.3–15.4)
WBC: 6.6 10*3/uL (ref 3.4–10.8)

## 2016-06-18 LAB — FERRITIN: Ferritin: 703 ng/mL — ABNORMAL HIGH (ref 15–150)

## 2016-06-18 LAB — HEPATITIS C ANTIBODY: Hep C Virus Ab: <0.1 s/co ratio (ref 0.0–0.9)

## 2016-06-18 LAB — IGG, IGA, IGM
IgA/Immunoglobulin A, Serum: 191 mg/dL (ref 64–422)
IgG (Immunoglobin G), Serum: 1306 mg/dL (ref 700–1600)
IgM (Immunoglobulin M), Srm: 82 mg/dL (ref 26–217)

## 2016-06-18 LAB — TISSUE TRANSGLUTAMINASE, IGA: Transglutaminase IgA: <2 U/mL (ref 0–3)

## 2016-06-18 LAB — HEPATITIS B SURFACE ANTIBODY,QUALITATIVE: HEP B SURFACE AB, QUAL: NONREACTIVE

## 2016-06-18 LAB — IRON AND TIBC
IRON: 57 ug/dL (ref 27–139)
Iron Saturation: 23 % (ref 15–55)
Total Iron Binding Capacity: 245 ug/dL — ABNORMAL LOW (ref 250–450)
UIBC: 188 ug/dL (ref 118–369)

## 2016-06-18 LAB — HEPATITIS B SURFACE ANTIGEN: Hepatitis B Surface Ag: NEGATIVE

## 2016-06-18 LAB — MITOCHONDRIAL/SMOOTH MUSCLE AB PNL
Mitochondrial Ab: 9.8 Units (ref 0.0–20.0)
SMOOTH MUSCLE AB: 18 U (ref 0–19)

## 2016-06-18 LAB — HEPATITIS A ANTIBODY, TOTAL: Hep A Total Ab: NEGATIVE

## 2016-06-19 NOTE — Progress Notes (Signed)
CC'D TO PCP °

## 2016-06-21 ENCOUNTER — Other Ambulatory Visit: Payer: Self-pay | Admitting: Internal Medicine

## 2016-06-21 MED ORDER — POTASSIUM CHLORIDE CRYS ER 20 MEQ PO TBCR: 20.0000 meq | EXTENDED_RELEASE_TABLET | Freq: Two times a day (BID) | ORAL | 0 refills | Status: DC

## 2016-07-06 DIAGNOSIS — W1789XA Other fall from one level to another, initial encounter: Secondary | ICD-10-CM | POA: Diagnosis not present

## 2016-07-06 DIAGNOSIS — Z880 Allergy status to penicillin: Secondary | ICD-10-CM | POA: Diagnosis not present

## 2016-07-06 DIAGNOSIS — Z91048 Other nonmedicinal substance allergy status: Secondary | ICD-10-CM | POA: Diagnosis not present

## 2016-07-06 DIAGNOSIS — Z881 Allergy status to other antibiotic agents status: Secondary | ICD-10-CM | POA: Diagnosis not present

## 2016-07-06 DIAGNOSIS — Z950 Presence of cardiac pacemaker: Secondary | ICD-10-CM | POA: Diagnosis not present

## 2016-07-06 DIAGNOSIS — Z91040 Latex allergy status: Secondary | ICD-10-CM | POA: Diagnosis not present

## 2016-07-06 DIAGNOSIS — S79912A Unspecified injury of left hip, initial encounter: Secondary | ICD-10-CM | POA: Diagnosis not present

## 2016-07-06 DIAGNOSIS — M25552 Pain in left hip: Secondary | ICD-10-CM | POA: Diagnosis not present

## 2016-07-06 DIAGNOSIS — Y9389 Activity, other specified: Secondary | ICD-10-CM | POA: Diagnosis not present

## 2016-07-06 DIAGNOSIS — Y92009 Unspecified place in unspecified non-institutional (private) residence as the place of occurrence of the external cause: Secondary | ICD-10-CM | POA: Diagnosis not present

## 2016-07-09 NOTE — Progress Notes (Signed)
No viral hepatitis. No celiac. No evidence of autoimmune hepatitis or PBC. She has mild anemia, likely chronic disease. No evidence of IDA.  She needs vaccination for Hep A/B please arrange.  Her ferritin is high and unfortunately her CMET was ordered as clinic collect so it didn't get done.   She needs Hereditary hemochromatosis genetic markers, CMET, PT/INR at her convenience.  She needs cardiac clearance for upper endoscopy, please send request to cardiology. Keep appt next month with slf.

## 2016-07-11 ENCOUNTER — Telehealth: Payer: Self-pay

## 2016-07-11 ENCOUNTER — Other Ambulatory Visit: Payer: Self-pay

## 2016-07-11 DIAGNOSIS — R7989 Other specified abnormal findings of blood chemistry: Secondary | ICD-10-CM

## 2016-07-11 DIAGNOSIS — K7460 Unspecified cirrhosis of liver: Secondary | ICD-10-CM

## 2016-07-11 NOTE — Progress Notes (Signed)
Pt is aware of results. Lab orders and the Rx for Hep A/B mailed to pt. She wants to do her labs in [place], [place].  She is aware we will request cardiac clearance for the upper endoscopy to schedule the upper endoscopy.

## 2016-07-11 NOTE — Telephone Encounter (Signed)
Dr. Lovena Le,   This patient was seen in our office recently by Neil Crouch, PA.   She would like to request cardiac clearance in order to get her scheduled for an upper endoscopy in the very near future.   Please advise!

## 2016-07-11 NOTE — Progress Notes (Signed)
Note sent to Dr. Lovena Le in epic requesting cardiac clearance for the endoscopy.

## 2016-07-20 ENCOUNTER — Other Ambulatory Visit: Payer: Self-pay | Admitting: *Deleted

## 2016-07-20 MED ORDER — POTASSIUM CHLORIDE CRYS ER 20 MEQ PO TBCR: 20.0000 meq | EXTENDED_RELEASE_TABLET | Freq: Two times a day (BID) | ORAL | 0 refills | Status: DC

## 2016-07-24 NOTE — Telephone Encounter (Signed)
Ardelle Park called back and said she has made the pt an appt to see Jory Sims, PA on 08/04/2016 at 2:30 pm.  I called and informed the pt and she said that was not a good date, her daughter has classes that day.  I gave her the number to call [phone]) and told her to call and reschedule and she said she would.

## 2016-07-24 NOTE — Telephone Encounter (Signed)
Pt is going to see Dr. Lovena Le 07/27/16 for cardiac clearance

## 2016-07-24 NOTE — Telephone Encounter (Signed)
I spoke with [name] [name] at the [facility]. She is going to route this to nurse and find out, said pt has not been seen there since 08/2015 and will probably need an OV first. She will let me know.

## 2016-07-25 NOTE — Telephone Encounter (Signed)
Forwarding to Neil Crouch, PA.

## 2016-07-27 ENCOUNTER — Encounter (INDEPENDENT_AMBULATORY_CARE_PROVIDER_SITE_OTHER): Payer: Self-pay

## 2016-07-27 ENCOUNTER — Ambulatory Visit (INDEPENDENT_AMBULATORY_CARE_PROVIDER_SITE_OTHER): Payer: Medicare Other | Admitting: Cardiology

## 2016-07-27 ENCOUNTER — Encounter: Payer: Self-pay | Admitting: Cardiology

## 2016-07-27 VITALS — BP 126/60 | HR 102 | Ht 66.0 in | Wt 300.0 lb

## 2016-07-27 DIAGNOSIS — K7460 Unspecified cirrhosis of liver: Secondary | ICD-10-CM | POA: Diagnosis not present

## 2016-07-27 DIAGNOSIS — N183 Chronic kidney disease, stage 3 (moderate): Secondary | ICD-10-CM | POA: Diagnosis not present

## 2016-07-27 DIAGNOSIS — I5042 Chronic combined systolic (congestive) and diastolic (congestive) heart failure: Secondary | ICD-10-CM

## 2016-07-27 DIAGNOSIS — Z23 Encounter for immunization: Secondary | ICD-10-CM | POA: Diagnosis not present

## 2016-07-27 DIAGNOSIS — Z0001 Encounter for general adult medical examination with abnormal findings: Secondary | ICD-10-CM | POA: Diagnosis not present

## 2016-07-27 DIAGNOSIS — I509 Heart failure, unspecified: Secondary | ICD-10-CM | POA: Diagnosis not present

## 2016-07-27 DIAGNOSIS — I10 Essential (primary) hypertension: Secondary | ICD-10-CM | POA: Diagnosis not present

## 2016-07-27 NOTE — Patient Instructions (Addendum)
Cleared for upper endoscopy    Keep appointment with Dr.Taylor 10/26/16 at 9:15 am [place] office

## 2016-07-27 NOTE — Progress Notes (Signed)
07/27/2016 [name] [name]   May 13, 1946  [id number]  Primary Physician [name], MD Primary Cardiologist: Dr. Harrington Challenger  Electrophysiologist: Dr. Lovena Le   Reason for Visit/CC: Surgical Clearance for upper endoscopy   HPI:  [name] [name] presents to clinic today for surgical clearance. She is tentatively scheduled to undergo and upper endoscopy to screen for esophageal varices. She reports that she was recently diagnosed with cirrhosis. She denies h/o ETOH.   She is a 70 y/o female, followed by Dr. Harrington Challenger and in EP clinic by Dr. Lovena Le. Her PMH is notable for HTN, chronic systolic CHF, morbid obesity, atrial fibrillation and CKD.  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 She refuses anticoagulation. She also refuses to go on BBs, despite her PPM. She states that she had a severe reaction to BBs in the past, unrelated to HR. Her last echocardiogram 10/2014 showed an EF of 25-30% with grade 3DD. She had a Myoview in June 2016 which showed no ischmia  LVEF 48%.   She denies any recent CP. She gets around mainly with her Hoveround but is able to walk. She denies any exertional CP with walking but gets short of breath walking long distances. Patient is morbidly obese.   Current Meds  Medication Sig  . allopurinol (ZYLOPRIM) 300 MG tablet Take 300 mg by mouth daily.    Marland Kitchen aspirin 325 MG tablet Take 325 mg by mouth daily.    . calcitRIOL (ROCALTROL) 0.25 MCG capsule Take 0.25 mcg by mouth daily.  . COD LIVER OIL PO Take 1 tablet by mouth daily.    Marland Kitchen docusate sodium (COLACE) 100 MG capsule Take 100 mg by mouth daily as needed for mild constipation.  . ferrous sulfate 325 (65 FE) MG tablet Take 325 mg by mouth every evening.   . furosemide (LASIX) 40 MG tablet TAKE 1.5 TABLETS (60 MG TABLETS BY MOUTH 2 (TWO) TIMES DAILY  . hydrALAZINE (APRESOLINE) 25 MG tablet Take 0.5 tablets (12.5 mg total) by mouth 3 (three) times daily.  Marland Kitchen HYDROcodone-acetaminophen (NORCO) 10-325 MG per tablet Take 1 tablet by  mouth 3 (three) times daily.    . isosorbide mononitrate (IMDUR) 30 MG 24 hr tablet Take 1 tablet (30 mg total) by mouth daily.  . Multiple Vitamin (MULTIVITAMIN) tablet Take 1 tablet by mouth daily.  Marland Kitchen omeprazole (PRILOSEC) 20 MG capsule Take 20 mg by mouth daily.    . potassium chloride SA (K-DUR,KLOR-CON) 20 MEQ tablet Take 1 tablet (20 mEq total) by mouth 2 (two) times daily.  . vitamin B-12 (CYANOCOBALAMIN) 500 MCG tablet Take 500 mcg by mouth daily.   Allergies  Allergen Reactions  . Cephalexin Shortness Of Breath    sob  . Codeine Anaphylaxis    REACTION: throat swelling  . Contrast Media [Iodinated Diagnostic Agents] Other (See Comments)    Patient states "I have chronic kidney disease so the doctor said no dye in my veins."  . Coreg [Carvedilol] Other (See Comments)    Beta Blockers cause her organs to shut down per patient  . Peppermint Oil Shortness Of Breath  . Prednisone Anaphylaxis, Shortness Of Breath and Swelling    REACTION: swelling, S.O.B.  . Tape Other (See Comments)    States plastic tape blisters her skin  . Ciprofloxacin Hives  . Latex Itching and Rash  . Penicillins Hives   Past Medical History:  Diagnosis Date  . Anemia   . Atrial fibrillation ([place])   . Bursitis   . Cancer ([place])   .  Cataract   . Cervical cancer ([place]) 1991   s/p hysterectomy  . Chronic cellulitis   . Chronic systolic congestive heart failure (HCC)    LVEF 25-30% with restrictive diastolic filling  . Degenerative joint disease   . Diverticulitis   . Essential hypertension   . Gallstones   . GERD (gastroesophageal reflux disease)   . Gout   . Kidney stones   . Morbid obesity ([place])   . Osteoarthritis   . Sinoatrial node dysfunction (HCC)    Status post PPM - Dr. Lovena Le  . Sleep apnea    CPAP   Family History  Problem Relation Age of Onset  . Heart attack Father   . Stroke Mother   . Diabetes Sister   . Colon cancer Other     seven family members  . Liver disease Neg Hx     . Other Neg Hx    Past Surgical History:  Procedure Laterality Date  . ABDOMINAL HYSTERECTOMY  1991  . COLONOSCOPY  1998   one polyp per patient  . EYE SURGERY    . PACEMAKER INSERTION  Nov 2000   St Jude with revision in 2011  . PERCUTANEOUS NEPHROLITHOTOMY  April 2012   Social History   Social History  . Marital status: Widowed    Spouse name: N/A  . Number of children: N/A  . Years of education: N/A   Occupational History  . Not on file.   Social History Main Topics  . Smoking status: Former Smoker    Packs/day: 1.00    Types: Cigarettes    Quit date: 09/26/1979  . Smokeless tobacco: Never Used  . Alcohol use No  . Drug use: No  . Sexual activity: No   Other Topics Concern  . Not on file   Social History Narrative  . No narrative on file     Review of Systems: General: negative for chills, fever, night sweats or weight changes.  Cardiovascular: negative for chest pain, dyspnea on exertion, edema, orthopnea, palpitations, paroxysmal nocturnal dyspnea or shortness of breath Dermatological: negative for rash Respiratory: negative for cough or wheezing Urologic: negative for hematuria Abdominal: negative for nausea, vomiting, diarrhea, bright red blood per rectum, melena, or hematemesis Neurologic: negative for visual changes, syncope, or dizziness All other systems reviewed and are otherwise negative except as noted above.   Physical Exam:  Blood pressure 126/60, pulse (!) 102, height 5' 6"  (1.676 m), weight 300 lb (136.1 kg), SpO2 98 %.  General appearance: alert, cooperative and no distress Neck: no carotid bruit and no JVD Lungs: clear to auscultation bilaterally Heart: regular rate and rhythm, S1, S2 normal, no murmur, click, rub or gallop Extremities: extremities normal, atraumatic, no cyanosis or edema Pulses: 2+ and symmetric Skin: Skin color, texture, turgor normal. No rashes or lesions Neurologic: Grossly normal  EKG Vpaced Rhythm   ASSESSMENT  AND PLAN:   1. Per-operative Assessment: Patient with no known h/o CAD. She had a normal NST 02/2015. She denies anginal symptomatolgy. Ok to proceed with upper Endoscopy, to r/o esophageal varies, w/o need for further cardiac testing. However given her cardiac history of PAF, combined systolic and diastolic dysfunction and PPM, she is of low- medium risk for complications.   2. PAF: Paced rhythm on EKG. Asymptomatic. HR currently in the low 100s, however patient refusing BBs and CCB. We discussed avoidance of caffeine. She also refuses anticoagulation.  3. H/o CHB: occurred post ablation. She has a PPM followed by Dr. Lovena Le.  4. HTN: BP is well controlled.   5. Chronic Combined Systolic and Diastolic HF: volume stable on exam. No dyspnea.   PLAN f/u with Dr. Harrington Challenger in 6 months.   Lyda Jester PA-C 07/27/2016 4:09 PM

## 2016-08-02 NOTE — Telephone Encounter (Signed)
Cardiology office note reviewed. Patient has been cleared for EGD. Please request patient to keep her appointment with Dr. Oneida Alar as scheduled for November 16

## 2016-08-04 ENCOUNTER — Ambulatory Visit: Payer: Medicare Other | Admitting: Adult Health

## 2016-08-04 NOTE — Telephone Encounter (Signed)
LMOM with the info and mailing a letter also.

## 2016-08-10 ENCOUNTER — Ambulatory Visit (INDEPENDENT_AMBULATORY_CARE_PROVIDER_SITE_OTHER): Payer: Medicare Other | Admitting: Gastroenterology

## 2016-08-10 ENCOUNTER — Telehealth: Payer: Self-pay

## 2016-08-10 DIAGNOSIS — M810 Age-related osteoporosis without current pathological fracture: Secondary | ICD-10-CM | POA: Diagnosis not present

## 2016-08-10 DIAGNOSIS — K7460 Unspecified cirrhosis of liver: Secondary | ICD-10-CM | POA: Diagnosis not present

## 2016-08-10 DIAGNOSIS — K7581 Nonalcoholic steatohepatitis (NASH): Secondary | ICD-10-CM | POA: Diagnosis not present

## 2016-08-10 NOTE — Assessment & Plan Note (Signed)
NEEDS BISPHOSPHONATE AND VIT D LEVEL CHECKED.

## 2016-08-10 NOTE — Progress Notes (Signed)
ON RECALL  °

## 2016-08-10 NOTE — Progress Notes (Signed)
cc'ed to pcp °

## 2016-08-10 NOTE — Progress Notes (Addendum)
Subjective:    Patient ID: [name] [name], female    DOB: 1946-04-29, 70 y.o.   MRN: [id number]  [name] Amel, MD  HPI No questions or concerns. Bms: 1-2X/WEEK AND MAYBE 2X/DAY. Don't walk. Using scooter for ~ 5 years. Occasional SOB due to CHF. HAS PACEMAKER FOR AFIB. LUNGS HURTS SOMETIMES AND WHEN SHE COUGHS IT QUITS. WEIGHT DOWN: ~30 LBS IN PAST 3 MOS(324 LBS AND NOW 273 LBS). LOST HER APPETITE. EATING A LITTLE MORE LATELY THAN HAS BEEN. HAS PAIN IN SHOULDER, ELBOW, HANDS AND R TOE. HANDS CAN BE STIFF. MAX WEIGHT: 382 LBS.   PT DENIES FEVER, CHILLS, HEMATOCHEZIA, HEMATEMESIS, nausea, vomiting, melena, diarrhea, CHEST PAIN, SHORTNESS OF BREATH,  CHANGE IN BOWEL IN HABITS, abdominal pain, problems swallowing, OR heartburn or indigestion.  Past Medical History:  Diagnosis Date  . Anemia   . Atrial fibrillation ([facility])   . Bursitis   . Cancer ([place])   . Cataract   . Cervical cancer ([place]) 1991   s/p hysterectomy  . Chronic cellulitis   . Chronic systolic congestive heart failure (HCC)    LVEF 25-30% with restrictive diastolic filling  . Degenerative joint disease   . Diverticulitis   . Essential hypertension   . Gallstones   . GERD (gastroesophageal reflux disease)   . Gout   . Kidney stones   . Morbid obesity ([place])   . Osteoarthritis   . Sinoatrial node dysfunction (HCC)    Status post PPM - Dr. Lovena Le  . Sleep apnea    CPAP    Past Surgical History:  Procedure Laterality Date  . ABDOMINAL HYSTERECTOMY  1991  . COLONOSCOPY  1998   one polyp per patient  . EYE SURGERY    . PACEMAKER INSERTION  Nov 2000   St Jude with revision in 2011  . PERCUTANEOUS NEPHROLITHOTOMY  April 2012   Allergies  Allergen Reactions  . Cephalexin Shortness Of Breath    sob  . Codeine Anaphylaxis    REACTION: throat swelling  . Contrast Media [Iodinated Diagnostic Agents] Other (See Comments)    Patient states "I have chronic kidney disease so the doctor said no dye in my veins."  .  Coreg [Carvedilol] Other (See Comments)    Beta Blockers cause her organs to shut down per patient  . Peppermint Oil Shortness Of Breath  . Prednisone Anaphylaxis, Shortness Of Breath and Swelling    REACTION: swelling, S.O.B.  . Tape Other (See Comments)    States plastic tape blisters her skin  . Ciprofloxacin Hives  . Latex Itching and Rash  . Penicillins Hives    Current Outpatient Prescriptions  Medication Sig Dispense Refill  . allopurinol (ZYLOPRIM) 300 MG tablet Take 300 mg by mouth daily.      Marland Kitchen aspirin 325 MG tablet Take 325 mg by mouth daily.      . calcitRIOL (ROCALTROL) 0.25 MCG capsule Take 0.25 mcg by mouth daily.    . COD LIVER OIL PO Take 1 tablet by mouth daily.      Marland Kitchen docusate sodium (COLACE) 100 MG capsule Take 100 mg by mouth daily as needed for mild constipation.    . ferrous sulfate 325 (65 FE) MG tablet Take 325 mg by mouth every evening.     . furosemide (LASIX) 40 MG tablet TAKE 1.5 TABLETS (60 MG TABLETS BY MOUTH 2 (TWO) TIMES DAILY    . hydrALAZINE (APRESOLINE) 25 MG tablet Take 0.5 tablets (12.5 mg total) by mouth 3 (three) times  daily.    Marland Kitchen HYDROcodone-acetaminophen (NORCO) 10-325 MG per tablet Take 1 tablet by mouth 3 (three) times daily.      . isosorbide mononitrate (IMDUR) 30 MG 24 hr tablet Take 1 tablet (30 mg total) by mouth daily.    . Multiple Vitamin (MULTIVITAMIN) tablet Take 1 tablet by mouth daily.    Marland Kitchen omeprazole (PRILOSEC) 20 MG capsule Take 20 mg by mouth daily.      . potassium chloride SA (K-DUR,KLOR-CON) 20 MEQ tablet Take 1 tablet (20 mEq total) by mouth 2 (two) times daily.    . vitamin B-12 (CYANOCOBALAMIN) 500 MCG tablet Take 500 mcg by mouth daily.     Family History  Problem Relation Age of Onset  . Heart attack Father   . Stroke Mother   . Diabetes Sister   . Colon cancer Other     seven family members  . Liver disease Neg Hx   . Other Neg Hx   UNCLE/COUSINS(3): COLON CANCER, COLON POLYPS: SISTER: FEMALE CANCER  Social  History   Social History  . Marital status: Widowed    Spouse name: N/A  . Number of children: ONE DAUGHTER  . Years of education: N/A   Social History Main Topics  . Smoking status: Former Smoker    Packs/day: 1.00    Types: Cigarettes    Quit date: 09/26/1979  . Smokeless tobacco: Never Used  . Alcohol use No  . Drug use: No  . Sexual activity: No  NO TRAVEL OUTSIDE [place]. USED TO BE A NA, FACTORIES,(KNITTING/FURNITURE).  Review of Systems PER HPI OTHERWISE ALL SYSTEMS ARE NEGATIVE.    Objective:   Physical Exam  Constitutional: She is oriented to person, place, and time. She appears well-developed and well-nourished. No distress.  HENT:  Head: Normocephalic and atraumatic.  Mouth/Throat: Oropharynx is clear and moist. No oropharyngeal exudate.  POOR DENTITION  Eyes: Pupils are equal, round, and reactive to light. No scleral icterus.  Neck: Normal range of motion. Neck supple.  Cardiovascular: Normal rate and regular rhythm.   Murmur heard. Irregular RHYTHM  Pulmonary/Chest: Effort normal and breath sounds normal. No respiratory distress.  Abdominal: Soft. Bowel sounds are normal. She exhibits no distension. There is no tenderness.  Musculoskeletal: She exhibits edema (BILATERAL LOWER EXTREMITIES).  Does not ambulate-USES SCOOTER  Lymphadenopathy:    She has no cervical adenopathy.  Neurological: She is alert and oriented to person, place, and time.  NO  NEW FOCAL DEFICITS, HARD OF HEARING  Skin: Skin is warm.  MACULAR ERYTHEMA CHANGES WITH HYPERPIGMENTATION O BILATERAL LOWER EXTREMITIES  Psychiatric: She has a normal mood and affect.  Vitals reviewed.     Assessment & Plan:

## 2016-08-10 NOTE — Assessment & Plan Note (Addendum)
WELL COMPENSATED DISEASE. DOUBT SIGNIFICANT PORTALHHTN DUE TO NL SPLEEN AND PLT COUNT. CHILD PUGH A   COMPLETE ULTRASOUND IN DEC 2017 TO HAVE ROUTINE SCREEN FOR LIVER CANCER. WE RECOMMEND THIS EVERY 6 MOS. CONSIDER UPPER ENDOSCOPY TO SCREEN FOR BULGING VEINS IN YOUR ESOPHAGUS. RISKS INCLUDE < 1% chance of medication reaction, PERFORATION, OR bleeding. GET A FLU SHOT EVERY YEAR AND KEEP PNEUMONIA VACCINE UP TO DATE. CONSIDER LONG ACTING BISPHOSPHONATE FOR OSTEOPOROSIS. I WILL OBTAIN IRON LABS FROM YOUR PRIMARY DOCTOR.  FOLLOW UP IN 6 MOS.   GREATER THAN 50% WAS SPENT IN COUNSELING & COORDINATION OF CARE WITH THE PATIENT: DISCUSSED DIFFERENTIAL DIAGNOSIS, PROCEDURE, BENEFITS, RISKS, AND MANAGEMENT OF CIRHHOSIS. TOTAL ENCOUNTER TIME: 14 MINS.

## 2016-08-10 NOTE — Patient Instructions (Signed)
COMPLETE BLOOD DRAW TODAY TO MAKE SURE YOUR BLOOD COUNT IS STABLE.  COMPLETE ULTRASOUND IN DEC 2017 TO HAVE ROUTINE SCREEN FOR LIVER CANCER. WE RECOMMEND THIS EVERY 6 MOS.  CONSIDER UPPER ENDOSCOPY TO SCREEN FOR BULGING VEINS IN YOUR ESOPHAGUS. RISKS INCLUDE < 1% chance of medication reaction, PERFORATION, OR bleeding.  YOU SHOULD MAKE SURE YOU GET A FLU SHOT EVERY YEAR AND KEEP YOUR PNEUMONIA VACCINE UP TO DATE.  I WILL OBTAIN IRON LABS FROM YOUR PRIMARY DOCTOR.  FOLLOW UP IN 6 MOS.

## 2016-08-10 NOTE — Telephone Encounter (Signed)
Called and informed pt of [place] Abd RUQ scheduled for 08/31/16 at 10:30 am. NPO for 6 hours prior. Letter also mailed.

## 2016-08-10 NOTE — Assessment & Plan Note (Signed)
COMPLICATED BY NASH CIRRHOSIS. WEIGHT DOWN 100 LBS SINCE AUG 2017.  CONTINUE TO MONITOR SYMPTOMS.

## 2016-08-15 ENCOUNTER — Other Ambulatory Visit: Payer: Self-pay | Admitting: Gastroenterology

## 2016-08-15 DIAGNOSIS — K7460 Unspecified cirrhosis of liver: Secondary | ICD-10-CM | POA: Diagnosis not present

## 2016-08-15 DIAGNOSIS — K7581 Nonalcoholic steatohepatitis (NASH): Secondary | ICD-10-CM | POA: Diagnosis not present

## 2016-08-15 DIAGNOSIS — M810 Age-related osteoporosis without current pathological fracture: Secondary | ICD-10-CM | POA: Diagnosis not present

## 2016-08-16 LAB — CBC/DIFF AMBIGUOUS DEFAULT
BASOS: 0 %
Basophils Absolute: 0 10*3/uL (ref 0.0–0.2)
EOS (ABSOLUTE): 0.3 10*3/uL (ref 0.0–0.4)
EOS: 5 %
HEMATOCRIT: 32.8 % — AB (ref 34.0–46.6)
Hemoglobin: 10.9 g/dL — ABNORMAL LOW (ref 11.1–15.9)
IMMATURE GRANS (ABS): 0 10*3/uL (ref 0.0–0.1)
IMMATURE GRANULOCYTES: 0 %
LYMPHS: 25 %
Lymphocytes Absolute: 1.4 10*3/uL (ref 0.7–3.1)
MCH: 31.1 pg (ref 26.6–33.0)
MCHC: 33.2 g/dL (ref 31.5–35.7)
MCV: 93 fL (ref 79–97)
Monocytes Absolute: 0.5 10*3/uL (ref 0.1–0.9)
Monocytes: 9 %
NEUTROS PCT: 61 %
Neutrophils Absolute: 3.4 10*3/uL (ref 1.4–7.0)
Platelets: 178 10*3/uL (ref 150–379)
RBC: 3.51 x10E6/uL — ABNORMAL LOW (ref 3.77–5.28)
RDW: 15.8 % — ABNORMAL HIGH (ref 12.3–15.4)
WBC: 5.6 10*3/uL (ref 3.4–10.8)

## 2016-08-16 LAB — VITAMIN D 25 HYDROXY (VIT D DEFICIENCY, FRACTURES): Vit D, 25-Hydroxy: 59.2 ng/mL (ref 30.0–100.0)

## 2016-08-23 NOTE — Progress Notes (Signed)
LMOM to call.

## 2016-08-23 NOTE — Progress Notes (Signed)
Pt is aware of results. 

## 2016-08-25 DIAGNOSIS — N183 Chronic kidney disease, stage 3 (moderate): Secondary | ICD-10-CM | POA: Diagnosis not present

## 2016-08-25 DIAGNOSIS — G4733 Obstructive sleep apnea (adult) (pediatric): Secondary | ICD-10-CM | POA: Diagnosis not present

## 2016-08-25 DIAGNOSIS — M1990 Unspecified osteoarthritis, unspecified site: Secondary | ICD-10-CM | POA: Diagnosis not present

## 2016-08-25 DIAGNOSIS — N200 Calculus of kidney: Secondary | ICD-10-CM | POA: Diagnosis not present

## 2016-08-25 DIAGNOSIS — M109 Gout, unspecified: Secondary | ICD-10-CM | POA: Diagnosis not present

## 2016-08-25 DIAGNOSIS — N2581 Secondary hyperparathyroidism of renal origin: Secondary | ICD-10-CM | POA: Diagnosis not present

## 2016-08-25 DIAGNOSIS — Z950 Presence of cardiac pacemaker: Secondary | ICD-10-CM | POA: Diagnosis not present

## 2016-08-25 DIAGNOSIS — I129 Hypertensive chronic kidney disease with stage 1 through stage 4 chronic kidney disease, or unspecified chronic kidney disease: Secondary | ICD-10-CM | POA: Diagnosis not present

## 2016-08-25 DIAGNOSIS — R609 Edema, unspecified: Secondary | ICD-10-CM | POA: Diagnosis not present

## 2016-08-25 DIAGNOSIS — D631 Anemia in chronic kidney disease: Secondary | ICD-10-CM | POA: Diagnosis not present

## 2016-08-25 DIAGNOSIS — I480 Paroxysmal atrial fibrillation: Secondary | ICD-10-CM | POA: Diagnosis not present

## 2016-08-31 ENCOUNTER — Ambulatory Visit (HOSPITAL_COMMUNITY)
Admission: RE | Admit: 2016-08-31 | Discharge: 2016-08-31 | Disposition: A | Discharge status: Home / Self Care | Payer: Medicare Other | Source: Ambulatory Visit | Attending: Gastroenterology | Admitting: Gastroenterology

## 2016-08-31 DIAGNOSIS — K8020 Calculus of gallbladder without cholecystitis without obstruction: Secondary | ICD-10-CM | POA: Diagnosis not present

## 2016-08-31 DIAGNOSIS — K7581 Nonalcoholic steatohepatitis (NASH): Secondary | ICD-10-CM | POA: Diagnosis not present

## 2016-08-31 DIAGNOSIS — K7460 Unspecified cirrhosis of liver: Secondary | ICD-10-CM | POA: Diagnosis not present

## 2016-09-08 DIAGNOSIS — N390 Urinary tract infection, site not specified: Secondary | ICD-10-CM | POA: Diagnosis not present

## 2016-09-08 DIAGNOSIS — N183 Chronic kidney disease, stage 3 (moderate): Secondary | ICD-10-CM | POA: Diagnosis not present

## 2016-09-11 ENCOUNTER — Other Ambulatory Visit: Payer: Self-pay

## 2016-09-11 MED ORDER — ISOSORBIDE MONONITRATE ER 30 MG PO TB24
30.0000 mg | ORAL_TABLET | Freq: Every day | ORAL | 3 refills | Status: DC
Start: 2016-09-11 — End: 2017-05-04

## 2016-09-14 ENCOUNTER — Telehealth: Payer: Self-pay | Admitting: Gastroenterology

## 2016-09-14 NOTE — Telephone Encounter (Signed)
Pt is aware.  

## 2016-09-14 NOTE — Telephone Encounter (Signed)
ON RECALL  °

## 2016-09-14 NOTE — Telephone Encounter (Signed)
PLEASE CALL PT. HER U/S SHOWS CIRRHOSIS. REPEAT IN 6 MOS.

## 2016-10-02 DIAGNOSIS — Z87440 Personal history of urinary (tract) infections: Secondary | ICD-10-CM | POA: Diagnosis not present

## 2016-10-25 DIAGNOSIS — R319 Hematuria, unspecified: Secondary | ICD-10-CM | POA: Diagnosis not present

## 2016-10-25 DIAGNOSIS — Z87440 Personal history of urinary (tract) infections: Secondary | ICD-10-CM | POA: Diagnosis not present

## 2016-10-26 ENCOUNTER — Ambulatory Visit (INDEPENDENT_AMBULATORY_CARE_PROVIDER_SITE_OTHER): Payer: Medicare Other | Admitting: Internal Medicine

## 2016-10-26 ENCOUNTER — Encounter: Payer: Self-pay | Admitting: Internal Medicine

## 2016-10-26 ENCOUNTER — Telehealth: Payer: Self-pay | Admitting: Internal Medicine

## 2016-10-26 DIAGNOSIS — I4821 Permanent atrial fibrillation: Secondary | ICD-10-CM

## 2016-10-26 DIAGNOSIS — Z23 Encounter for immunization: Secondary | ICD-10-CM | POA: Diagnosis not present

## 2016-10-26 DIAGNOSIS — I482 Chronic atrial fibrillation: Secondary | ICD-10-CM

## 2016-10-26 LAB — CUP PACEART INCLINIC DEVICE CHECK
Battery Voltage: 2.92 V
Brady Statistic RV Percent Paced: 99.03 %
Date Time Interrogation Session: 20180201151533
Implantable Lead Implant Date: 20001110
Implantable Pulse Generator Implant Date: 20111202
Lead Channel Pacing Threshold Amplitude: 0.75 V
Lead Channel Sensing Intrinsic Amplitude: 12 mV
MDC IDC LEAD LOCATION: 753860
MDC IDC MSMT LEADCHNL RV IMPEDANCE VALUE: 575 Ohm
MDC IDC MSMT LEADCHNL RV PACING THRESHOLD PULSEWIDTH: 0.4 ms
MDC IDC PG SERIAL: 7171350
MDC IDC SET LEADCHNL RV PACING AMPLITUDE: 1 V
MDC IDC SET LEADCHNL RV PACING PULSEWIDTH: 0.4 ms
MDC IDC SET LEADCHNL RV SENSING SENSITIVITY: 4 mV
Pulse Gen Model: 1210

## 2016-10-26 NOTE — Telephone Encounter (Signed)
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

## 2016-10-26 NOTE — Telephone Encounter (Signed)
DEMOGRAPHIC AND INSURANCE  INFO FAXED DID NOT  SEE  FACE TO  FACE   INFO .[name] [name]

## 2016-10-26 NOTE — Progress Notes (Signed)
HPI [name] [name] returns today for followup. 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 She has hypertension, and has refused to take any anticoagulation other than aspirin for the past 13 years. She continues to have chronic peripheral edema and venous insufficiency. 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 She has class IIl systolic/diastolic heart failure symptoms. She has developed worsening renal insufficiency with a serum creatinine over 2. She has lost some weight in the past year though she has been gaining lately. She is frustrated by her inability to get around without a scooter. Allergies  Allergen Reactions  . Cephalexin Shortness Of Breath    sob  . Codeine Anaphylaxis    REACTION: throat swelling  . Contrast Media [Iodinated Diagnostic Agents] Other (See Comments)    Patient states "I have chronic kidney disease so the doctor said no dye in my veins."  . Coreg [Carvedilol] Other (See Comments)    Beta Blockers cause her organs to shut down per patient  . Peppermint Oil Shortness Of Breath  . Prednisone Anaphylaxis, Shortness Of Breath and Swelling    REACTION: swelling, S.O.B.  . Tape Other (See Comments)    States plastic tape blisters her skin  . Ciprofloxacin Hives  . Latex Itching and Rash  . Penicillins Hives     Current Outpatient Prescriptions  Medication Sig Dispense Refill  . allopurinol (ZYLOPRIM) 300 MG tablet Take 300 mg by mouth daily.      Marland Kitchen aspirin 325 MG tablet Take 325 mg by mouth daily.      . calcitRIOL (ROCALTROL) 0.25 MCG capsule Take 0.25 mcg by mouth daily.    . COD LIVER OIL PO Take 1 tablet by mouth daily.      Marland Kitchen docusate sodium (COLACE) 100 MG capsule Take 100 mg by mouth daily as needed for mild constipation.    . ferrous sulfate 325 (65 FE) MG tablet Take 325 mg by mouth  every evening.     . furosemide (LASIX) 40 MG tablet TAKE 1.5 TABLETS (60 MG TABLETS BY MOUTH 2 (TWO) TIMES DAILY    . hydrALAZINE (APRESOLINE) 25 MG tablet Take 0.5 tablets (12.5 mg total) by mouth 3 (three) times daily. 45 tablet 11  . HYDROcodone-acetaminophen (NORCO) 10-325 MG per tablet Take 1 tablet by mouth 3 (three) times daily.      . isosorbide mononitrate (IMDUR) 30 MG 24 hr tablet Take 1 tablet (30 mg total) by mouth daily. 90 tablet 3  . Multiple Vitamin (MULTIVITAMIN) tablet Take 1 tablet by mouth daily.    Marland Kitchen omeprazole (PRILOSEC) 20 MG capsule Take 20 mg by mouth daily.      . potassium chloride SA (K-DUR,KLOR-CON) 20 MEQ tablet Take 1 tablet (20 mEq total) by mouth 2 (two) times daily. 180 tablet 0  . vitamin B-12 (CYANOCOBALAMIN) 500 MCG tablet Take 500 mcg by mouth daily.     No current facility-administered medications for this visit.      Past Medical History:  Diagnosis Date  . Anemia   . Atrial fibrillation ([place])   . Bursitis   . Cancer ([place])   . Cataract   . Cervical cancer ([place]) 1991   s/p hysterectomy  . Chronic cellulitis   . Chronic systolic congestive heart failure (HCC)    LVEF 25-30% with restrictive diastolic filling  . Degenerative joint disease   .  Diverticulitis   . Essential hypertension   . Gallstones   . GERD (gastroesophageal reflux disease)   . Gout   . Kidney stones   . Morbid obesity ([place])   . Osteoarthritis   . Sinoatrial node dysfunction (HCC)    Status post PPM - Dr. Lovena Le  . Sleep apnea    CPAP    ROS:   All systems reviewed and negative except as noted in the HPI.   Past Surgical History:  Procedure Laterality Date  . ABDOMINAL HYSTERECTOMY  1991  . COLONOSCOPY  1998   one polyp per patient  . EYE SURGERY    . PACEMAKER INSERTION  Nov 2000   St Jude with revision in 2011  . PERCUTANEOUS NEPHROLITHOTOMY  April 2012     Family History  Problem Relation Age of Onset  . Heart attack Father   . Stroke Mother   .  Diabetes Sister   . Colon cancer Other     seven family members  . Liver disease Neg Hx   . Other Neg Hx      Social History   Social History  . Marital status: Widowed    Spouse name: N/A  . Number of children: N/A  . Years of education: N/A   Occupational History  . Not on file.   Social History Main Topics  . Smoking status: Former Smoker    Packs/day: 1.00    Types: Cigarettes    Quit date: 09/26/1979  . Smokeless tobacco: Never Used  . Alcohol use No  . Drug use: No  . Sexual activity: No   Other Topics Concern  . Not on file   Social History Narrative  . No narrative on file     BP 135/79   Pulse 99   Ht 5' 6"  (1.676 m)   Wt 293 lb (132.9 kg)   BMI 47.29 kg/m   Physical Exam:  Morbidly obese appearing middle-aged woman, NAD HEENT: Unremarkable Neck:  7 cm JVD, no thyromegally Lungs:  Clear with no wheezes, rales, or rhonchi. HEART:  Regular rate rhythm, no murmurs, no rubs, no clicks Abd:  soft, positive bowel sounds, no organomegally, no rebound, no guarding Ext:  2 plus pulses, 2+ peripheral edema, no cyanosis, no clubbing Skin:  No rashes no nodules Neuro:  CN II through XII intact, motor grossly intact  DEVICE  Normal device function.  See PaceArt for details.   Assess/Plan:  1. CHB - she is s/p PPM.  2. Chronic systolic and diastolic heart failure - she is on medical therapy but we are limited by renal insufficiency. We discussed the possibility of adding Metolazone to her regimen but she declines. 3. PPM - her St. Jude single chamber PPM is working normally. 4. Morbid obesity - I suspect this is the cause of her cirrhosis. Following with GI. She is encouraged to lose weight.  Mikle Bosworth.D.

## 2016-10-26 NOTE — Patient Instructions (Addendum)
Your physician wants you to follow-up in: 1 Year with Dr. Lovena Le. You will receive a reminder letter in the mail two months in advance. If you don't receive a letter, please call our office to schedule the follow-up appointment.  Your physician recommends that you schedule a follow-up appointment in: 6 Months with the [facility] have been given a Rx for a wheelchair today   Your physician recommends that you continue on your current medications as directed. Please refer to the Current Medication list given to you today.  If you need a refill on your cardiac medications before your next appointment, please call your pharmacy.  Thank you for choosing [facility]!

## 2016-11-13 ENCOUNTER — Other Ambulatory Visit: Payer: Self-pay | Admitting: *Deleted

## 2016-11-13 MED ORDER — POTASSIUM CHLORIDE CRYS ER 20 MEQ PO TBCR: 20.0000 meq | EXTENDED_RELEASE_TABLET | Freq: Two times a day (BID) | ORAL | 3 refills | Status: DC

## 2016-11-21 ENCOUNTER — Encounter (HOSPITAL_COMMUNITY): Payer: Self-pay | Admitting: Emergency Medicine

## 2016-11-21 ENCOUNTER — Inpatient Hospital Stay (HOSPITAL_COMMUNITY)
Admission: EM | Admit: 2016-11-21 | Discharge: 2016-12-03 | DRG: 291 | Disposition: A | Discharge status: Home / Self Care | Payer: Medicare Other | Attending: Internal Medicine | Admitting: Internal Medicine

## 2016-11-21 ENCOUNTER — Emergency Department (HOSPITAL_COMMUNITY): Payer: Medicare Other

## 2016-11-21 DIAGNOSIS — I872 Venous insufficiency (chronic) (peripheral): Secondary | ICD-10-CM | POA: Diagnosis present

## 2016-11-21 DIAGNOSIS — J9601 Acute respiratory failure with hypoxia: Secondary | ICD-10-CM | POA: Diagnosis present

## 2016-11-21 DIAGNOSIS — Z79899 Other long term (current) drug therapy: Secondary | ICD-10-CM

## 2016-11-21 DIAGNOSIS — I5042 Chronic combined systolic (congestive) and diastolic (congestive) heart failure: Secondary | ICD-10-CM | POA: Diagnosis present

## 2016-11-21 DIAGNOSIS — M19011 Primary osteoarthritis, right shoulder: Secondary | ICD-10-CM | POA: Diagnosis present

## 2016-11-21 DIAGNOSIS — Z7982 Long term (current) use of aspirin: Secondary | ICD-10-CM

## 2016-11-21 DIAGNOSIS — I878 Other specified disorders of veins: Secondary | ICD-10-CM | POA: Diagnosis present

## 2016-11-21 DIAGNOSIS — I5041 Acute combined systolic (congestive) and diastolic (congestive) heart failure: Secondary | ICD-10-CM | POA: Diagnosis not present

## 2016-11-21 DIAGNOSIS — K219 Gastro-esophageal reflux disease without esophagitis: Secondary | ICD-10-CM | POA: Diagnosis present

## 2016-11-21 DIAGNOSIS — I480 Paroxysmal atrial fibrillation: Secondary | ICD-10-CM | POA: Diagnosis present

## 2016-11-21 DIAGNOSIS — Z9989 Dependence on other enabling machines and devices: Secondary | ICD-10-CM

## 2016-11-21 DIAGNOSIS — Z91048 Other nonmedicinal substance allergy status: Secondary | ICD-10-CM

## 2016-11-21 DIAGNOSIS — G4733 Obstructive sleep apnea (adult) (pediatric): Secondary | ICD-10-CM | POA: Diagnosis present

## 2016-11-21 DIAGNOSIS — K5900 Constipation, unspecified: Secondary | ICD-10-CM | POA: Diagnosis present

## 2016-11-21 DIAGNOSIS — Z880 Allergy status to penicillin: Secondary | ICD-10-CM

## 2016-11-21 DIAGNOSIS — B372 Candidiasis of skin and nail: Secondary | ICD-10-CM | POA: Diagnosis present

## 2016-11-21 DIAGNOSIS — I081 Rheumatic disorders of both mitral and tricuspid valves: Secondary | ICD-10-CM | POA: Diagnosis present

## 2016-11-21 DIAGNOSIS — Z888 Allergy status to other drugs, medicaments and biological substances status: Secondary | ICD-10-CM

## 2016-11-21 DIAGNOSIS — I130 Hypertensive heart and chronic kidney disease with heart failure and stage 1 through stage 4 chronic kidney disease, or unspecified chronic kidney disease: Principal | ICD-10-CM | POA: Diagnosis present

## 2016-11-21 DIAGNOSIS — M109 Gout, unspecified: Secondary | ICD-10-CM | POA: Diagnosis present

## 2016-11-21 DIAGNOSIS — N184 Chronic kidney disease, stage 4 (severe): Secondary | ICD-10-CM | POA: Diagnosis present

## 2016-11-21 DIAGNOSIS — H9319 Tinnitus, unspecified ear: Secondary | ICD-10-CM | POA: Diagnosis present

## 2016-11-21 DIAGNOSIS — I509 Heart failure, unspecified: Secondary | ICD-10-CM

## 2016-11-21 DIAGNOSIS — I442 Atrioventricular block, complete: Secondary | ICD-10-CM | POA: Diagnosis present

## 2016-11-21 DIAGNOSIS — I10 Essential (primary) hypertension: Secondary | ICD-10-CM | POA: Diagnosis not present

## 2016-11-21 DIAGNOSIS — Z79891 Long term (current) use of opiate analgesic: Secondary | ICD-10-CM

## 2016-11-21 DIAGNOSIS — R109 Unspecified abdominal pain: Secondary | ICD-10-CM

## 2016-11-21 DIAGNOSIS — Z91040 Latex allergy status: Secondary | ICD-10-CM

## 2016-11-21 DIAGNOSIS — Z87891 Personal history of nicotine dependence: Secondary | ICD-10-CM

## 2016-11-21 DIAGNOSIS — I5043 Acute on chronic combined systolic (congestive) and diastolic (congestive) heart failure: Secondary | ICD-10-CM | POA: Diagnosis present

## 2016-11-21 DIAGNOSIS — R0602 Shortness of breath: Secondary | ICD-10-CM | POA: Diagnosis not present

## 2016-11-21 DIAGNOSIS — I428 Other cardiomyopathies: Secondary | ICD-10-CM | POA: Diagnosis not present

## 2016-11-21 DIAGNOSIS — R0603 Acute respiratory distress: Secondary | ICD-10-CM | POA: Diagnosis not present

## 2016-11-21 DIAGNOSIS — Z6841 Body Mass Index (BMI) 40.0 and over, adult: Secondary | ICD-10-CM

## 2016-11-21 DIAGNOSIS — I482 Chronic atrial fibrillation: Secondary | ICD-10-CM | POA: Diagnosis present

## 2016-11-21 DIAGNOSIS — G8929 Other chronic pain: Secondary | ICD-10-CM | POA: Diagnosis present

## 2016-11-21 DIAGNOSIS — I313 Pericardial effusion (noninflammatory): Secondary | ICD-10-CM | POA: Diagnosis present

## 2016-11-21 DIAGNOSIS — K6389 Other specified diseases of intestine: Secondary | ICD-10-CM | POA: Diagnosis not present

## 2016-11-21 DIAGNOSIS — Z950 Presence of cardiac pacemaker: Secondary | ICD-10-CM

## 2016-11-21 DIAGNOSIS — N390 Urinary tract infection, site not specified: Secondary | ICD-10-CM | POA: Diagnosis present

## 2016-11-21 DIAGNOSIS — N183 Chronic kidney disease, stage 3 (moderate): Secondary | ICD-10-CM | POA: Diagnosis not present

## 2016-11-21 DIAGNOSIS — K7581 Nonalcoholic steatohepatitis (NASH): Secondary | ICD-10-CM | POA: Diagnosis present

## 2016-11-21 DIAGNOSIS — I5023 Acute on chronic systolic (congestive) heart failure: Secondary | ICD-10-CM | POA: Diagnosis not present

## 2016-11-21 DIAGNOSIS — I429 Cardiomyopathy, unspecified: Secondary | ICD-10-CM | POA: Diagnosis present

## 2016-11-21 DIAGNOSIS — I5021 Acute systolic (congestive) heart failure: Secondary | ICD-10-CM | POA: Diagnosis not present

## 2016-11-21 DIAGNOSIS — Z885 Allergy status to narcotic agent status: Secondary | ICD-10-CM

## 2016-11-21 DIAGNOSIS — K7460 Unspecified cirrhosis of liver: Secondary | ICD-10-CM | POA: Diagnosis present

## 2016-11-21 DIAGNOSIS — M25511 Pain in right shoulder: Secondary | ICD-10-CM

## 2016-11-21 DIAGNOSIS — N185 Chronic kidney disease, stage 5: Secondary | ICD-10-CM | POA: Diagnosis present

## 2016-11-21 DIAGNOSIS — Z881 Allergy status to other antibiotic agents status: Secondary | ICD-10-CM

## 2016-11-21 DIAGNOSIS — I4821 Permanent atrial fibrillation: Secondary | ICD-10-CM | POA: Diagnosis present

## 2016-11-21 DIAGNOSIS — M779 Enthesopathy, unspecified: Secondary | ICD-10-CM | POA: Diagnosis present

## 2016-11-21 DIAGNOSIS — I4949 Other premature depolarization: Secondary | ICD-10-CM | POA: Diagnosis present

## 2016-11-21 DIAGNOSIS — Z8249 Family history of ischemic heart disease and other diseases of the circulatory system: Secondary | ICD-10-CM

## 2016-11-21 DIAGNOSIS — R008 Other abnormalities of heart beat: Secondary | ICD-10-CM | POA: Diagnosis present

## 2016-11-21 DIAGNOSIS — Z91041 Radiographic dye allergy status: Secondary | ICD-10-CM

## 2016-11-21 DIAGNOSIS — Z91018 Allergy to other foods: Secondary | ICD-10-CM

## 2016-11-21 DIAGNOSIS — I4891 Unspecified atrial fibrillation: Secondary | ICD-10-CM | POA: Diagnosis not present

## 2016-11-21 LAB — COMPREHENSIVE METABOLIC PANEL
ALT: 20 U/L (ref 14–54)
ANION GAP: 10 (ref 5–15)
AST: 33 U/L (ref 15–41)
Albumin: 4.3 g/dL (ref 3.5–5.0)
Alkaline Phosphatase: 65 U/L (ref 38–126)
BUN: 40 mg/dL — ABNORMAL HIGH (ref 6–20)
CHLORIDE: 100 mmol/L — AB (ref 101–111)
CO2: 29 mmol/L (ref 22–32)
Calcium: 9.5 mg/dL (ref 8.9–10.3)
Creatinine, Ser: 1.93 mg/dL — ABNORMAL HIGH (ref 0.44–1.00)
GFR, EST AFRICAN AMERICAN: 29 mL/min — AB (ref >60)
GFR, EST NON AFRICAN AMERICAN: 25 mL/min — AB (ref >60)
Glucose, Bld: 122 mg/dL — ABNORMAL HIGH (ref 65–99)
Potassium: 4.4 mmol/L (ref 3.5–5.1)
Sodium: 139 mmol/L (ref 135–145)
Total Bilirubin: 0.8 mg/dL (ref 0.3–1.2)
Total Protein: 7.4 g/dL (ref 6.5–8.1)

## 2016-11-21 LAB — BRAIN NATRIURETIC PEPTIDE: B Natriuretic Peptide: 133 pg/mL — ABNORMAL HIGH (ref 0.0–100.0)

## 2016-11-21 LAB — CBC WITH DIFFERENTIAL/PLATELET
BASOS PCT: 0 %
Basophils Absolute: 0 10*3/uL (ref 0.0–0.1)
EOS ABS: 0.2 10*3/uL (ref 0.0–0.7)
Eosinophils Relative: 3 %
HCT: 34.9 % — ABNORMAL LOW (ref 36.0–46.0)
Hemoglobin: 11.5 g/dL — ABNORMAL LOW (ref 12.0–15.0)
Lymphocytes Relative: 17 %
Lymphs Abs: 1.2 10*3/uL (ref 0.7–4.0)
MCH: 31.7 pg (ref 26.0–34.0)
MCHC: 33 g/dL (ref 30.0–36.0)
MCV: 96.1 fL (ref 78.0–100.0)
Monocytes Absolute: 0.6 10*3/uL (ref 0.1–1.0)
Monocytes Relative: 8 %
NEUTROS ABS: 5 10*3/uL (ref 1.7–7.7)
NEUTROS PCT: 72 %
Platelets: 138 10*3/uL — ABNORMAL LOW (ref 150–400)
RBC: 3.63 MIL/uL — AB (ref 3.87–5.11)
RDW: 15.3 % (ref 11.5–15.5)
WBC: 6.9 10*3/uL (ref 4.0–10.5)

## 2016-11-21 LAB — I-STAT TROPONIN, ED: TROPONIN I, POC: 0.02 ng/mL (ref 0.00–0.08)

## 2016-11-21 LAB — TROPONIN I
TROPONIN I: 0.03 ng/mL — AB (ref <0.03)
Troponin I: 0.03 ng/mL (ref <0.03)

## 2016-11-21 MED ORDER — ISOSORBIDE MONONITRATE ER 60 MG PO TB24
30.0000 mg | ORAL_TABLET | Freq: Every day | ORAL | Status: DC
  Administered 2016-11-22 – 2016-12-03 (×12): 30 mg via ORAL
  Filled 2016-11-21 (×12): qty 1

## 2016-11-21 MED ORDER — FUROSEMIDE 10 MG/ML IJ SOLN
40.0000 mg | Freq: Two times a day (BID) | INTRAMUSCULAR | Status: DC
  Administered 2016-11-21: 40 mg via INTRAVENOUS
  Filled 2016-11-21 (×2): qty 4

## 2016-11-21 MED ORDER — PANTOPRAZOLE SODIUM 40 MG PO TBEC
40.0000 mg | DELAYED_RELEASE_TABLET | Freq: Every day | ORAL | Status: DC
  Administered 2016-11-21 – 2016-12-03 (×13): 40 mg via ORAL
  Filled 2016-11-21 (×13): qty 1

## 2016-11-21 MED ORDER — VITAMIN B-12 1000 MCG PO TABS
500.0000 ug | ORAL_TABLET | Freq: Every day | ORAL | Status: DC
  Administered 2016-11-21 – 2016-12-03 (×13): 500 ug via ORAL
  Filled 2016-11-21 (×13): qty 1

## 2016-11-21 MED ORDER — POTASSIUM CHLORIDE CRYS ER 20 MEQ PO TBCR
20.0000 meq | EXTENDED_RELEASE_TABLET | Freq: Two times a day (BID) | ORAL | Status: DC
  Administered 2016-11-21 – 2016-12-03 (×24): 20 meq via ORAL
  Filled 2016-11-21 (×6): qty 1
  Filled 2016-11-21: qty 2
  Filled 2016-11-21 (×3): qty 1
  Filled 2016-11-21: qty 2
  Filled 2016-11-21 (×11): qty 1
  Filled 2016-11-21: qty 2
  Filled 2016-11-21: qty 1

## 2016-11-21 MED ORDER — ENOXAPARIN SODIUM 60 MG/0.6ML ~~LOC~~ SOLN
60.0000 mg | SUBCUTANEOUS | Status: DC
  Administered 2016-11-21 – 2016-12-03 (×13): 60 mg via SUBCUTANEOUS
  Filled 2016-11-21 (×13): qty 0.6

## 2016-11-21 MED ORDER — METOLAZONE 5 MG PO TABS
2.5000 mg | ORAL_TABLET | Freq: Once | ORAL | Status: AC
  Administered 2016-11-21: 2.5 mg via ORAL
  Filled 2016-11-21: qty 1

## 2016-11-21 MED ORDER — FERROUS SULFATE 325 (65 FE) MG PO TABS
325.0000 mg | ORAL_TABLET | Freq: Every evening | ORAL | Status: DC
  Administered 2016-11-21 – 2016-12-03 (×13): 325 mg via ORAL
  Filled 2016-11-21 (×13): qty 1

## 2016-11-21 MED ORDER — HYDROCODONE-ACETAMINOPHEN 10-325 MG PO TABS
1.0000 | ORAL_TABLET | Freq: Three times a day (TID) | ORAL | Status: DC
  Administered 2016-11-21 – 2016-12-03 (×36): 1 via ORAL
  Filled 2016-11-21 (×36): qty 1

## 2016-11-21 MED ORDER — FUROSEMIDE 10 MG/ML IJ SOLN
80.0000 mg | Freq: Once | INTRAMUSCULAR | Status: AC
  Administered 2016-11-21: 80 mg via INTRAVENOUS
  Filled 2016-11-21: qty 8

## 2016-11-21 MED ORDER — NYSTATIN 100000 UNIT/GM EX POWD
Freq: Three times a day (TID) | CUTANEOUS | Status: DC
  Administered 2016-11-21 – 2016-12-03 (×33): via TOPICAL
  Filled 2016-11-21 (×4): qty 15

## 2016-11-21 MED ORDER — ASPIRIN 325 MG PO TABS
325.0000 mg | ORAL_TABLET | Freq: Every day | ORAL | Status: DC
  Administered 2016-11-21 – 2016-12-03 (×13): 325 mg via ORAL
  Filled 2016-11-21 (×13): qty 1

## 2016-11-21 NOTE — ED Notes (Signed)
Pt also c/o chest "tightness" for several days, worse with movement.

## 2016-11-21 NOTE — ED Triage Notes (Signed)
Sob x 2 days, worse today. resp are labored

## 2016-11-21 NOTE — ED Provider Notes (Signed)
Emergency Department Provider Note   I have reviewed the triage vital signs and the nursing notes.   HISTORY  Chief Complaint Shortness of Breath   HPI [name] [name] is a 71 y.o. female with PMH of a-fib, CHF, HTN, GERD, and obesity resents to the emergency department for evaluation of difficulty breathing and increased fluid retention. Patient states that she's felt "bad" increasingly worse over the past 2 weeks. She had a pacemaker check 2 weeks ago and was told it was working properly. She describes fatigue, dyspnea, and creased fluid in her lower extremities and now abdomen. She finds that she becomes especially short of breath when speaking order shifting around in bed. He denies any fever, productive cough, chills. No vomiting or diarrhea. No alleviating factors. Symptoms are severe and gradually worsening. No radiation of symptoms.    Past Medical History:  Diagnosis Date  . Anemia   . Atrial fibrillation ([place])   . Bursitis   . Cancer ([place])   . Cataract   . Cervical cancer ([place]) 1991   s/p hysterectomy  . Chronic cellulitis   . Chronic systolic congestive heart failure (HCC)    LVEF 25-30% with restrictive diastolic filling  . Degenerative joint disease   . Diverticulitis   . Essential hypertension   . Gallstones   . GERD (gastroesophageal reflux disease)   . Gout   . Kidney stones   . Morbid obesity ([place])   . Osteoarthritis   . Sinoatrial node dysfunction (HCC)    Status post PPM - Dr. Lovena Le  . Sleep apnea    CPAP    Patient Active Problem List   Diagnosis Date Noted  . Acute CHF (congestive heart failure) ([place]) 11/21/2016  . Osteoporosis 08/10/2016  . Liver cirrhosis secondary to NASH ([place]) 06/13/2016  . Chest pain 10/30/2014  . Chronic renal failure 03/07/2013  . Acute systolic congestive heart failure ([place]) 03/07/2013  . Sinoatrial node dysfunction (HCC)   . Chronic combined systolic and diastolic CHF (congestive heart failure) ([place]) 05/07/2011    . Morbid obesity ([place]) 05/07/2011  . PPM-St.Jude 01/19/2010  . OSA (obstructive sleep apnea) 05/05/2009  . Essential hypertension 05/04/2009  . Atrial fibrillation ([place]) 05/04/2009  . ALLERGIC RHINITIS 05/04/2009    Past Surgical History:  Procedure Laterality Date  . ABDOMINAL HYSTERECTOMY  1991  . COLONOSCOPY  1998   one polyp per patient  . EYE SURGERY    . PACEMAKER INSERTION  Nov 2000   St Jude with revision in 2011  . PERCUTANEOUS NEPHROLITHOTOMY  April 2012      Allergies Cephalexin; Codeine; Contrast media [iodinated diagnostic agents]; Coreg [carvedilol]; Peppermint flavor; Prednisone; Tape; Ciprofloxacin; Latex; and Penicillins  Family History  Problem Relation Age of Onset  . Heart attack Father   . Stroke Mother   . Diabetes Sister   . Colon cancer Other     seven family members  . Liver disease Neg Hx   . Other Neg Hx     Social History Social History  Substance Use Topics  . Smoking status: Former Smoker    Packs/day: 1.00    Types: Cigarettes    Quit date: 09/26/1979  . Smokeless tobacco: Never Used  . Alcohol use No    Review of Systems  Constitutional: No fever/chills Eyes: No visual changes. ENT: No sore throat. Cardiovascular: Intermittent chest pain. Positive increasing LE edema.  Respiratory: Positive shortness of breath and cough.  Gastrointestinal: No abdominal pain.  No nausea, no vomiting.  No diarrhea.  No constipation. Genitourinary: Negative for dysuria. Musculoskeletal: Negative for back pain. Skin: Negative for rash. Neurological: Negative for headaches, focal weakness or numbness.  10-point ROS otherwise negative.  ____________________________________________   PHYSICAL EXAM:  VITAL SIGNS: ED Triage Vitals  Enc Vitals Group     BP 11/21/16 1228 155/62     Pulse Rate 11/21/16 1228 61     Resp 11/21/16 1228 21     Temp 11/21/16 1228 97.9 F (36.6 C)     Temp Source 11/21/16 1228 Oral     SpO2 11/21/16 1225 (!) 89  %     Pain Score 11/21/16 1219 10   Constitutional: Alert and oriented. Chronically ill-appearing. No acute distress.  Eyes: Conjunctivae are normal.  Head: Atraumatic. Nose: No congestion/rhinnorhea. Mouth/Throat: Mucous membranes are moist.  Oropharynx non-erythematous. Neck: No stridor.   Cardiovascular: Normal rate, regular rhythm. Good peripheral circulation. Grossly normal heart sounds.   Respiratory: Increased respiratory effort with talking or movement.  No retractions. Lungs with crackles at the bases. Gastrointestinal: Soft and nontender. No distention.  Musculoskeletal: No lower extremity tenderness. Positive bilateral 2+ pitting edema. No gross deformities of extremities. Neurologic:  Normal speech and language. No gross focal neurologic deficits are appreciated.  Skin:  Skin is warm, dry and intact. Chronic venous insufficiency skin changes in B/L LEs. No cellulitis.  Psychiatric: Mood and affect are normal. Speech and behavior are normal.  ____________________________________________   LABS (all labs ordered are listed, but only abnormal results are displayed)  Labs Reviewed  COMPREHENSIVE METABOLIC PANEL - Abnormal; Notable for the following:       Result Value   Chloride 100 (*)    Glucose, Bld 122 (*)    BUN 40 (*)    Creatinine, Ser 1.93 (*)    GFR calc non Af Amer 25 (*)    GFR calc Af Amer 29 (*)    All other components within normal limits  CBC WITH DIFFERENTIAL/PLATELET - Abnormal; Notable for the following:    RBC 3.63 (*)    Hemoglobin 11.5 (*)    HCT 34.9 (*)    Platelets 138 (*)    All other components within normal limits  BRAIN NATRIURETIC PEPTIDE - Abnormal; Notable for the following:    B Natriuretic Peptide 133.0 (*)    All other components within normal limits  TROPONIN I - Abnormal; Notable for the following:    Troponin I 0.03 (*)    All other components within normal limits  TROPONIN I  TROPONIN I  BASIC METABOLIC PANEL  I-STAT  TROPOININ, ED   ____________________________________________  EKG   EKG Interpretation  Date/Time:  Tuesday November 21 2016 12:24:42 EST Ventricular Rate:  103 PR Interval:    QRS Duration: 162 QT Interval:  431 QTC Calculation: 482 R Axis:   -86 Text Interpretation:  Ventricular bigeminy Nonspecific IVCD with LAD Left ventricular hypertrophy No STEMI.  Confirmed by LONG MD, JOSHUA [phone]) on 11/21/2016 12:34:55 PM       ____________________________________________  RADIOLOGY  Dg Chest 2 View  Result Date: 11/21/2016 CLINICAL DATA:  Chest tightness. EXAM: CHEST  2 VIEW COMPARISON:  04/05/2016. FINDINGS: Cardiac pacer with lead tip over the right ventricle. Cardiomegaly with pulmonary vascular prominence and bilateral interstitial prominence with right side pleural effusion. Findings consistent with congestive heart failure. No pneumothorax. IMPRESSION: 1. Cardiac pacer with lead tip over the right ventricle. 2. Congestive heart failure with pulmonary venous congestion and right-sided pleural effusion . Electronically Signed  By: Bertrand   On: 11/21/2016 13:52    ____________________________________________   PROCEDURES  Procedure(s) performed:   Procedures  None ____________________________________________   INITIAL IMPRESSION / ASSESSMENT AND PLAN / ED COURSE  Pertinent labs & imaging results that were available during my care of the patient were reviewed by me and considered in my medical decision making (see chart for details).  Patient resents to the emergency department for evaluation of dyspnea on exertion Oxygen requirement setting of increased swelling in the lower extremities. Patient has been compliant with her Lasix but notes decreased urine output despite taking these medications. Patient does appear to be volume up on exam and has a new oxygen requirement with oxygen saturation of 89% on room air. She becomes notably dyspneic with any kind of  talking or movement. EKG shows bigeminy with no acute ischemia. Plan to follow labs and CXR with reassessment. No overt signs/symptoms to suggest influenza, PNA, or other infectious process.   Patient with edema on CXR which matches exam and patient's symptoms. Gave IV lasix and discussed with hospitalist regarding admission.   Discussed patient's case with hospitalist  Patient and family (if present) updated with plan. Care transferred to hospitalist service.  I reviewed all nursing notes, vitals, pertinent old records, EKGs, labs, imaging (as available).   ____________________________________________  FINAL CLINICAL IMPRESSION(S) / ED DIAGNOSES  Final diagnoses:  Acute respiratory distress     MEDICATIONS GIVEN DURING THIS VISIT:  Medications  potassium chloride SA (K-DUR,KLOR-CON) CR tablet 20 mEq (not administered)  isosorbide mononitrate (IMDUR) 24 hr tablet 30 mg (not administered)  vitamin B-12 (CYANOCOBALAMIN) tablet 500 mcg (500 mcg Oral Given 11/21/16 1632)  ferrous sulfate tablet 325 mg (325 mg Oral Given 11/21/16 1830)  HYDROcodone-acetaminophen (NORCO) 10-325 MG per tablet 1 tablet (1 tablet Oral Given 11/21/16 1612)  aspirin tablet 325 mg (325 mg Oral Given 11/21/16 1631)  pantoprazole (PROTONIX) EC tablet 40 mg (40 mg Oral Given 11/21/16 1632)  enoxaparin (LOVENOX) injection 60 mg (60 mg Subcutaneous Given 11/21/16 1632)  nystatin (MYCOSTATIN/NYSTOP) topical powder ( Topical Given 11/21/16 1632)  furosemide (LASIX) injection 40 mg (40 mg Intravenous Given 11/21/16 1924)  furosemide (LASIX) injection 80 mg (80 mg Intravenous Given 11/21/16 1429)  metolazone (ZAROXOLYN) tablet 2.5 mg (2.5 mg Oral Given 11/21/16 1924)     NEW OUTPATIENT MEDICATIONS STARTED DURING THIS VISIT:  None   Note:  This document was prepared using Dragon voice recognition software and may include unintentional dictation errors.  Nanda Quinton, MD Emergency Medicine  Margette Fast, MD 11/21/16  2124

## 2016-11-21 NOTE — ED Notes (Signed)
ED Provider at bedside. 

## 2016-11-21 NOTE — Progress Notes (Signed)
CRITICAL VALUE ALERT  Critical value received:  Trop 0.03  Date of notification:  11/21/2016  Time of notification:  1703  Critical value read back:Yes.    Nurse who received alert:  Barbra Sarks, RN  MD notified (1st page):  Adair Patter, MD  Time of first page:  1705  MD notified (2nd page):  Time of second page:  Responding MD:    Time MD responded:

## 2016-11-21 NOTE — H&P (Signed)
History and Physical    [name] [name]:[id number] DOB: [birth year] DOA: 11/21/2016  PCP: [name] Amel, MD Patient coming from: Home  Chief Complaint: Shortness of Breath  HPI: [name] [name] is a 71 y.o. 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  She reports she previously spoke to her cardiologist about this and ultimately called her primary care physician but states she did not feel she was heard (of note in the last cardiology office note she was offered metolazone for her heart failure but she declined).  She then states her symptoms truly began over six months ago but have gradually worsened.  She has noticed an increase in her edema in the past three months.  She reports her shortness of breath is so significant that standing up to pivot to the toilet makes her short of breath.  She reports that she has chronic kidney failure and that she feels as though everything is so intertwined she is worried about trying any new medication because she is worried that it will affect one of her failing organs.  She mentions that she was told only within the past year she has cirrhosis. She has chronic peripheral edema and venous insufficiency.  Last echocardiogram was in February 2016 which showed a left ventricle: The cavity size was normal. There was mildconcentric hypertrophy. Systolic function was severely reduced.The estimated ejection fraction was in the range of 25% to 30%. Diffuse hypokinesis. Doppler parameters are consistent with arestrictive pattern, indicative of decreased left ventriculardiastolic  compliance and/or increased left atrial pressure (grade 3 diastolic dysfunction).  Patient denies chest pain, chest pressure, denied pain with respiration.  Denies nausea, vomiting, diarrhea.  Leg pain that is chronic and occasional open wounds on legs that patient states happen when she scratches her legs too much. Reports that she often "runs cold". Uses electric scooter to ambulate and does not walk but can stand and pivot.   ED Course: Patient was seen in the ED and found to be hypoxic on room air (saturation on room air of 87-89%).  She was placed on 2L [place].  She was given 70m IV lasix once.  EKG showed bigeminy, troponin POC 0.02. Cr of 1.93, BNP of 133.0.  CXR showing Cardiac pacer with lead tip over the right ventricle. Congestive heart failure with pulmonary venous congestion and right-sided pleural effusion.  TRH was asked to admited for hypoxia/ acute respiratory failure and CHF exacerbation.  Review of Systems: As per HPI otherwise 10 point review of systems negative.    Past Medical History:  Diagnosis Date  . Anemia   . Atrial [place] ([place]   . Bursitis   . [place]   . Cataract   . [place]   s/p hysterectomy  . Chronic cellulitis   . Chronic systolic congestive heart failure (HCC)    LVEF 25-30% with restrictive diastolic filling  . Degenerative joint disease   . Diverticulitis   . Essential hypertension   . Gallstones   . GERD (gastroesophageal reflux disease)   . Gout   . Kidney stones   . [place]bid [place]   . Osteoarthritis   . Sinoatrial  node dysfunction (HCC)    Status post PPM - Dr. Lovena Le  . Sleep apnea    CPAP    Past Surgical History:  Procedure Laterality Date  . ABDOMINAL HYSTERECTOMY  1991  . COLONOSCOPY  1998   one polyp per patient  . EYE SURGERY    . PACEMAKER INSERTION  Nov 2000   St Jude with revision in 2011  . PERCUTANEOUS NEPHROLITHOTOMY  April 2012     reports that she quit smoking about 37 years  ago. Her smoking use included Cigarettes. She smoked 1.00 pack per day. She has never used smokeless tobacco. She reports that she does not drink alcohol or use drugs.  Allergies  Allergen Reactions  . Cephalexin Shortness Of Breath    sob  . Codeine Anaphylaxis    REACTION: throat swelling  . Contrast Media [Iodinated Diagnostic Agents] Other (See Comments)    Patient states "I have chronic kidney disease so the doctor said no dye in my veins."  . Coreg [Carvedilol] Other (See Comments)    Beta Blockers cause her organs to shut down per patient  . Peppermint Flavor Shortness Of Breath  . Prednisone Anaphylaxis, Shortness Of Breath and Swelling    REACTION: swelling, S.O.B.  . Tape Other (See Comments)    States plastic tape blisters her skin  . Ciprofloxacin Hives  . Latex Itching and Rash  . Penicillins Hives    Family History  Problem Relation Age of Onset  . Heart attack Father   . Stroke Mother   . Diabetes Sister   . Colon cancer Other     seven family members  . Liver disease Neg Hx   . Other Neg Hx      Prior to Admission medications   Medication Sig Start Date End Date Taking? Authorizing Provider  allopurinol (ZYLOPRIM) 300 MG tablet Take 300 mg by mouth daily.     Yes Historical Provider, MD  aspirin 325 MG tablet Take 325 mg by mouth daily.     Yes Historical Provider, MD  calcitRIOL (ROCALTROL) 0.25 MCG capsule Take 0.25 mcg by mouth daily.   Yes Historical Provider, MD  COD LIVER OIL PO Take 1 tablet by mouth daily.     Yes Historical Provider, MD  docusate sodium (COLACE) 100 MG capsule Take 100 mg by mouth daily as needed for mild constipation.   Yes Historical Provider, MD  ferrous sulfate 325 (65 FE) MG tablet Take 325 mg by mouth every evening.    Yes Historical Provider, MD  furosemide (LASIX) 40 MG tablet TAKE 1.5 TABLETS (60 MG TABLETS BY MOUTH 2 (TWO) TIMES DAILY   Yes Historical Provider, MD  hydrALAZINE (APRESOLINE) 25 MG tablet Take 0.5 tablets  (12.5 mg total) by mouth 3 (three) times daily. 11/17/15  Yes Fay Records, MD  HYDROcodone-acetaminophen (NORCO) 10-325 MG per tablet Take 1 tablet by mouth 3 (three) times daily.     Yes Historical Provider, MD  isosorbide mononitrate (IMDUR) 30 MG 24 hr tablet Take 1 tablet (30 mg total) by mouth daily. 09/11/16  Yes Brittainy Erie Noe, PA-C  Multiple Vitamin (MULTIVITAMIN) tablet Take 1 tablet by mouth daily.   Yes Historical Provider, MD  omeprazole (PRILOSEC) 20 MG capsule Take 20 mg by mouth daily.     Yes Historical Provider, MD  potassium chloride SA (K-DUR,KLOR-CON) 20 MEQ tablet Take 1 tablet (20 mEq total) by mouth 2 (two) times daily. 11/13/16  Yes Evans Lance, MD  vitamin  B-12 (CYANOCOBALAMIN) 500 MCG tablet Take 500 mcg by mouth daily.   Yes Historical Provider, MD    Physical Exam: Vitals:   11/21/16 1330 11/21/16 1400 11/21/16 1430 11/21/16 1518  BP: 138/65 139/56 142/73 133/79  Pulse: 96 (!) 32 (!) 48 97  Resp: 25 20 21 18   Temp:    97.8 F (36.6 C)  TempSrc:    Oral  SpO2: 98% 95% 97% 97%  Weight:    (!) 137.3 kg (302 lb 9.6 oz)  Height:    5' 6"  (1.676 m)      Constitutional: NAD, calm, comfortable Vitals:   11/21/16 1330 11/21/16 1400 11/21/16 1430 11/21/16 1518  BP: 138/65 139/56 142/73 133/79  Pulse: 96 (!) 32 (!) 48 97  Resp: 25 20 21 18   Temp:    97.8 F (36.6 C)  TempSrc:    Oral  SpO2: 98% 95% 97% 97%  Weight:    (!) 137.3 kg (302 lb 9.6 oz)  Height:    5' 6"  (1.676 m)   Eyes: PERRL, lids and conjunctivae normal ENMT: Mucous membranes are moist. Posterior pharynx clear of any exudate or lesions.Normal dentition. Nasal cannula in place Neck: normal, supple, no masses, no thyromegaly, no JVD Respiratory: increased respiratory effort, minor accessory muscle use, crackles throughout lung fields bilaterally Cardiovascular: irregular rhythm, regular rate, no murmurs / rubs / gallops. No extremity edema. 2+ pedal pulses. No carotid bruits.  Abdomen:  obese, no tenderness, large midline hernia palpated. No hepatosplenomegaly. Bowel sounds positive.  Musculoskeletal: no clubbing / cyanosis. No joint deformity upper and lower extremities. No contractures.  Skin: venous stasis changes of the lower extremities bilaterally with few open sores, erythema (appears candidial) under pannus and under bilateral breasts Neurologic: CN 2-12 grossly intact. Sensation intact, DTR normal. Decreased strength in lower extremities (chronic per patient).  Psychiatric: Unclear insight. Alert and oriented x 3. Normal mood.     Labs on Admission: I have personally reviewed following labs and imaging studies  CBC:  Recent Labs Lab 11/21/16 1307  WBC 6.9  NEUTROABS 5.0  HGB 11.5*  HCT 34.9*  MCV 96.1  PLT 741*   Basic Metabolic Panel:  Recent Labs Lab 11/21/16 1307  NA 139  K 4.4  CL 100*  CO2 29  GLUCOSE 122*  BUN 40*  CREATININE 1.93*  CALCIUM 9.5   GFR: Estimated Creatinine Clearance: 38.2 mL/min (by C-G formula based on SCr of 1.93 mg/dL (H)). Liver Function Tests:  Recent Labs Lab 11/21/16 1307  AST 33  ALT 20  ALKPHOS 65  BILITOT 0.8  PROT 7.4  ALBUMIN 4.3   No results for input(s): LIPASE, AMYLASE in the last 168 hours. No results for input(s): AMMONIA in the last 168 hours. Coagulation Profile: No results for input(s): INR, PROTIME in the last 168 hours. Cardiac Enzymes: No results for input(s): CKTOTAL, CKMB, CKMBINDEX, TROPONINI in the last 168 hours. BNP (last 3 results) No results for input(s): PROBNP in the last 8760 hours. HbA1C: No results for input(s): HGBA1C in the last 72 hours. CBG: No results for input(s): GLUCAP in the last 168 hours. Lipid Profile: No results for input(s): CHOL, HDL, LDLCALC, TRIG, CHOLHDL, LDLDIRECT in the last 72 hours. Thyroid Function Tests: No results for input(s): TSH, T4TOTAL, FREET4, T3FREE, THYROIDAB in the last 72 hours. Anemia Panel: No results for input(s): VITAMINB12,  FOLATE, FERRITIN, TIBC, IRON, RETICCTPCT in the last 72 hours. Urine analysis:    Component Value Date/Time   COLORURINE YELLOW 04/05/2016  [place] 04/05/2016 1850   LABSPEC 1.015 04/05/2016 1850   PHURINE 6.0 04/05/2016 1850   GLUCOSEU NEGATIVE 04/05/2016 1850   HGBUR NEGATIVE 04/05/2016 1850   BILIRUBINUR NEGATIVE 04/05/2016 1850   KETONESUR NEGATIVE 04/05/2016 1850   PROTEINUR NEGATIVE 04/05/2016 1850   UROBILINOGEN 0.2 03/06/2013 2035   NITRITE NEGATIVE 04/05/2016 1850   LEUKOCYTESUR SMALL (A) 04/05/2016 1850   Sepsis Labs: !!!!!!!!!!!!!!!!!!!!!!!!!!!!!!!!!!!!!!!!!!!! @LABRCNTIP (procalcitonin:4,lacticidven:4) )No results found for this or any previous visit (from the past 240 hour(s)).   Radiological Exams on Admission: Dg Chest 2 View  Result Date: 11/21/2016 CLINICAL DATA:  Chest tightness. EXAM: CHEST  2 VIEW COMPARISON:  04/05/2016. FINDINGS: Cardiac pacer with lead tip over the right ventricle. Cardiomegaly with pulmonary vascular prominence and bilateral interstitial prominence with right side pleural effusion. Findings consistent with congestive heart failure. No pneumothorax. IMPRESSION: 1. Cardiac pacer with lead tip over the right ventricle. 2. Congestive heart failure with pulmonary venous congestion and right-sided pleural effusion . Electronically Signed   By: Marcello Moores  Register   On: 11/21/2016 13:52    EKG: Independently reviewed. Bigeminy  Assessment/Plan Principal Problem:   Acute CHF (congestive heart failure) (HCC) Active Problems:   OSA (obstructive sleep apnea)   Essential hypertension   Atrial fibrillation (HCC)   Chronic combined systolic and diastolic CHF (congestive heart failure) (HCC)   Chronic renal failure   Liver cirrhosis secondary to [facility])     Acute CHF - Cardiology consulted - Echocardiogram ordered - 80m IV lasix x 1 given - will given 1 dose of metolazone - lasix 616mIV BID - strict I's and O's - daily  weights - low salt, fluid restriction - 5kg weight increase since she saw Dr. TaLovena Len 10/26/16  OSA - Bipap qhs ordered  Atrial fibrillation - patient reports she will NOT take any blood thinners - continue aspirin  Chronic Renal Failure - repeat BMP in am - will monitor I/O - patient requesting nephrology consultation but at this time do not feel it is necessary  Liver cirrhosis 2/2 NASH - sees Dr. FiOneida Alarutpt - LFT's WNL but coags not drawn - monitor medications for liver metabolism  Candida infection - nystatin powder - may need to increase therapy pending improvement   DVT prophylaxis: Lovenox Code Status:  Partial Code- patient would allow CPR, ACLS medications, BiPap, and cardioversion/ defibrillation but would NOT allow intubation Family Communication: Daughter is bedside as is family friend Disposition Plan: May need rehab at time of discharge given significant debilty Consults called: Cardiology consult Admission status: Inpatient, Telemetry   AlLoretha StaplerD [facility] Pager [phone] 7PM-7AM, please contact night-coverage [email or web address] Password [email or web address]2/27/2018, 4:08 PM

## 2016-11-22 ENCOUNTER — Other Ambulatory Visit: Payer: Self-pay | Admitting: Internal Medicine

## 2016-11-22 ENCOUNTER — Inpatient Hospital Stay (HOSPITAL_COMMUNITY): Payer: Medicare Other

## 2016-11-22 DIAGNOSIS — I428 Other cardiomyopathies: Secondary | ICD-10-CM

## 2016-11-22 DIAGNOSIS — I5043 Acute on chronic combined systolic (congestive) and diastolic (congestive) heart failure: Secondary | ICD-10-CM

## 2016-11-22 DIAGNOSIS — I482 Chronic atrial fibrillation: Secondary | ICD-10-CM

## 2016-11-22 DIAGNOSIS — N183 Chronic kidney disease, stage 3 (moderate): Secondary | ICD-10-CM

## 2016-11-22 DIAGNOSIS — I509 Heart failure, unspecified: Secondary | ICD-10-CM

## 2016-11-22 LAB — BASIC METABOLIC PANEL
ANION GAP: 8 (ref 5–15)
BUN: 40 mg/dL — ABNORMAL HIGH (ref 6–20)
CHLORIDE: 101 mmol/L (ref 101–111)
CO2: 31 mmol/L (ref 22–32)
Calcium: 9.2 mg/dL (ref 8.9–10.3)
Creatinine, Ser: 2 mg/dL — ABNORMAL HIGH (ref 0.44–1.00)
GFR calc Af Amer: 28 mL/min — ABNORMAL LOW (ref >60)
GFR calc non Af Amer: 24 mL/min — ABNORMAL LOW (ref >60)
GLUCOSE: 91 mg/dL (ref 65–99)
POTASSIUM: 3.9 mmol/L (ref 3.5–5.1)
Sodium: 140 mmol/L (ref 135–145)

## 2016-11-22 LAB — ECHOCARDIOGRAM COMPLETE
E decel time: 384 msec
E/e' ratio: 16.31
FS: 38 % (ref 28–44)
HEIGHTINCHES: 66 in
IVS/LV PW RATIO, ED: 0.81
LA ID, A-P, ES: 42 mm
LA diam end sys: 42 mm
LA vol A4C: 131 ml
LA vol: 155 mL
LADIAMINDEX: 1.76 cm/m2
LAVOLIN: 64.9 mL/m2
LDCA: 3.14 cm2
LV E/e' medial: 16.31
LV E/e'average: 16.31
LV TDI E'LATERAL: 8.03
LVELAT: 8.03 cm/s
LVOT diameter: 20 mm
MV Dec: 384
MV Peak grad: 7 mmHg
MV pk A vel: 50.7 m/s
MVPKEVEL: 131 m/s
PW: 14.7 mm — AB (ref 0.6–1.1)
RV TAPSE: 19.1 mm
Reg peak vel: 252 cm/s
TDI e' medial: 5.85
TR max vel: 252 cm/s
WEIGHTICAEL: 4864 [oz_av]

## 2016-11-22 LAB — TROPONIN I: Troponin I: 0.03 ng/mL (ref <0.03)

## 2016-11-22 MED ORDER — AMMONIUM LACTATE 12 % EX LOTN
TOPICAL_LOTION | Freq: Every day | CUTANEOUS | Status: DC
  Administered 2016-11-22: 12:00:00 via TOPICAL
  Administered 2016-11-23: 1 via TOPICAL
  Administered 2016-11-24 – 2016-11-28 (×5): via TOPICAL
  Administered 2016-11-29 – 2016-11-30 (×2): 1 via TOPICAL
  Administered 2016-12-01 – 2016-12-03 (×3): via TOPICAL
  Filled 2016-11-22: qty 400

## 2016-11-22 MED ORDER — POTASSIUM CHLORIDE CRYS ER 20 MEQ PO TBCR: 20.0000 meq | EXTENDED_RELEASE_TABLET | Freq: Two times a day (BID) | ORAL | 3 refills | Status: DC

## 2016-11-22 MED ORDER — FUROSEMIDE 10 MG/ML IJ SOLN
80.0000 mg | Freq: Two times a day (BID) | INTRAMUSCULAR | Status: DC
  Filled 2016-11-22: qty 8

## 2016-11-22 MED ORDER — FUROSEMIDE 10 MG/ML IJ SOLN
40.0000 mg | Freq: Once | INTRAMUSCULAR | Status: AC
  Administered 2016-11-22: 40 mg via INTRAVENOUS

## 2016-11-22 MED ORDER — HYDRALAZINE HCL 25 MG PO TABS
12.5000 mg | ORAL_TABLET | Freq: Two times a day (BID) | ORAL | Status: DC
  Administered 2016-11-22 – 2016-12-03 (×23): 12.5 mg via ORAL
  Filled 2016-11-22 (×24): qty 1

## 2016-11-22 MED ORDER — METOLAZONE 5 MG PO TABS
5.0000 mg | ORAL_TABLET | Freq: Once | ORAL | Status: AC
  Administered 2016-11-22: 5 mg via ORAL
  Filled 2016-11-22: qty 1

## 2016-11-22 MED ORDER — HYDROCODONE-ACETAMINOPHEN 10-325 MG PO TABS
1.0000 | ORAL_TABLET | Freq: Once | ORAL | Status: AC
  Administered 2016-11-22: 1 via ORAL
  Filled 2016-11-22: qty 1

## 2016-11-22 MED ORDER — FLUCONAZOLE 100 MG PO TABS
100.0000 mg | ORAL_TABLET | Freq: Every day | ORAL | Status: DC
  Administered 2016-11-22 – 2016-12-03 (×12): 100 mg via ORAL
  Filled 2016-11-22 (×12): qty 1

## 2016-11-22 NOTE — Evaluation (Signed)
Physical Therapy Evaluation Patient Details Name: [name] [name] MRN: [id number] DOB: [birth year] Today's Date: 11/22/2016   History of Present Illness  [name] [name] is a 71 y.o. 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  She reports she previously spoke to her cardiologist about this and ultimately called her primary care physician but states she did not feel she was heard (of note in the last cardiology office note she was offered metolazone for her heart failure but she declined).  She then states her symptoms truly began over six months ago but have gradually worsened.  She has noticed an increase in her edema in the past three months.  She reports her shortness of breath is so significant that standing up to pivot to the toilet makes her short of breath.  She reports that she has chronic kidney failure and that she feels as though everything is so intertwined she is worried about trying any new medication because she is worried that it will affect one of her failing organs.  She mentions that she was told only within the past year she has cirrhosis. She has chronic peripheral edema and venous insufficiency.  Last echocardiogram was in February 2016 which showed a left ventricle: The cavity size was normal. There was mild concentric hypertrophy. Systolic function was severely reduced. The estimated ejection fraction was in the range of 25% to 30%.    Clinical Impression  Pt received in bed, and was agreeable to PT evaluation.  Pt's friend Olivia Mackie arrived during PT evaluation and assist with encouraging pt to work  with PT.  Pt states that at baseline she lives with her 2 dtr's but is home alone during the day because her dtr's work long hours as a Engineer, agricultural, and a Cardiology PA.  Pt normally mobilizes her home with a motorized scooter, and is able to transfer bed<>chair with RW on her own.  She is able to take a few steps from her bed to the bathroom with a RW when she is washing up.  She states she is independent with dressing and sponge bathing.  During PT evaluation today, she requires +2 person assist for bed mobility due to extensive edema in B LE's.  She becomes very anxious during mobility, and prefers to use HHA for sit<>stand.  She was able to take a few steps to transfer bed<>chair with holding onto the chair arm rests as she turns.  Currently, she is recommended for SNF due to extensive need for increased assistance during all functional mobility tasks.  However, when PT mentions this recommendation, pt becomes extremely anxious and expresses that she does not want to go there, and asking her friend Olivia Mackie to not let her go there.  If she ends up going home, she will need HHPT, and HHOT, as well as an aide due to being alone during the majority of the day.      Follow Up Recommendations SNF;Home health PT;Supervision/Assistance - 24 hour    Equipment Recommendations  None recommended by PT (Pt has all DME needed at this point. )    Recommendations for Other Services       Precautions /  Restrictions Precautions Precautions: Fall Precaution Comments: Due to immobility.  Restrictions Weight Bearing Restrictions: No      Mobility  Bed Mobility Overal bed mobility: Needs Assistance Bed Mobility: Supine to Sit     Supine to sit: +2 for physical assistance;+2 for safety/equipment;HOB elevated;Total assist     General bed mobility comments: Increased assistance required for overall bed mobility due to increased pain in BLEs, SOB, and anxiety.   Transfers Overall transfer level: Needs  assistance Equipment used: 2 person hand held assist;None Transfers: Sit to/from Stand Sit to Stand: +2 safety/equipment;+2 physical assistance;Mod assist;From elevated surface         General transfer comment: Increased anxiety during transfer. VC to calm down and complete transfer safely. Patient prefered hand held assistance to pull herself up versus using arms to stand. Slow laboring small steps taken to the left to transfer to recliner.   Ambulation/Gait Ambulation/Gait assistance:  (NA - pt is very anxious at this time, and has extremely limited gait at baseline. )              Stairs            Wheelchair Mobility    Modified Rankin (Stroke Patients Only)       Balance Overall balance assessment: Needs assistance Sitting-balance support: Bilateral upper extremity supported;Feet supported Sitting balance-Leahy Scale: Good     Standing balance support: Bilateral upper extremity supported Standing balance-Leahy Scale: Poor                               Pertinent Vitals/Pain Pain Assessment: 0-10 Pain Score: 7  Pain Location: continuous - everywhere, but mostly hips/knees/shoulders/low back. - Hurting Pain Descriptors / Indicators:  ("It hurts.") Pain Intervention(s): Limited activity within patient's tolerance;Monitored during session;Repositioned    Home Living   Living Arrangements: Children (2 daughters)   Type of Home: House Home Access: Level entry     Home Layout: Two level;Able to live on main level with bedroom/bathroom Home Equipment: Tub bench;Walker - 2 wheels;Bedside commode;Cane - single point;Electric scooter;Hospital bed;Other (comment) (Manual w/c has been ordered. Has a hospital bed although she states it is too high and she unable to get into it.)      Prior Function Level of Independence: Independent with assistive device(s)   Gait / Transfers Assistance Needed: Pt uses the scooter for mobility in the house.  She is  able to use the RW to transfer and ambulate very short distances (30f)  ADL's / Homemaking Assistance Needed: Pt is independent with sponge bathing, and dressing. Reports that she has not been able to get into the shower to bath in the past few months due to SOB.        Hand Dominance   Dominant Hand: Right    Extremity/Trunk Assessment   Upper Extremity Assessment Upper Extremity Assessment: Defer to OT evaluation;Generalized weakness RUE Deficits / Details: Decreased RUE shoulder ROM which patient reports is due to arthritis. Patient is able to complete shoulder flexion through less than 10% range. Functional gross grasp bilaterally. LUE strength is 4+/5 in all ranges.    Lower Extremity Assessment Lower Extremity Assessment: Generalized weakness       Communication   Communication: No difficulties  Cognition Arousal/Alertness: Awake/alert Behavior During Therapy: Anxious Overall Cognitive Status: Within Functional Limits for tasks assessed                 General Comments: Pt  is very anxious during mobility, and requires multiple vc's for breathing and relaxation.      General Comments      Exercises     Assessment/Plan    PT Assessment Patient needs continued PT services  PT Problem List Decreased strength;Decreased activity tolerance;Decreased balance;Decreased mobility;Decreased knowledge of use of DME;Decreased safety awareness;Decreased knowledge of precautions;Cardiopulmonary status limiting activity;Pain;Obesity;Decreased skin integrity       PT Treatment Interventions DME instruction;Gait training;Functional mobility training;Therapeutic activities;Therapeutic exercise;Balance training;Patient/family education;Wheelchair mobility training    PT Goals (Current goals can be found in the Care Plan section)  Acute Rehab PT Goals Patient Stated Goal: to go home PT Goal Formulation: With patient/family Time For Goal Achievement: 12/06/16 Potential to  Achieve Goals: Fair    Frequency Min 3X/week   Barriers to discharge Decreased caregiver support Pt lives with 2 dtrs, however they work long sifts - one is a Water engineer, and the other is a Cardiology PA who works at [employer].      Co-evaluation PT/OT/SLP Co-Evaluation/Treatment: Yes Reason for Co-Treatment: Complexity of the patient's impairments (multi-system involvement);For patient/therapist safety;To address functional/ADL transfers PT goals addressed during session: Mobility/safety with mobility;Balance OT goals addressed during session: Strengthening/ROM       End of Session Equipment Utilized During Treatment: Gait belt;Oxygen (Re-assurance and education to have pt allow PT to don gait belt for safety. ) Activity Tolerance: Patient limited by fatigue;Other (comment) (Limited due to anxiety) Patient left: in chair;with call bell/phone within reach;with family/visitor present Nurse Communication: Mobility status Joya Salm, RN notified of pt's mobiltiy status, and mobility sheet left hanging in the room.  ) PT Visit Diagnosis: Other abnormalities of gait and mobility (R26.89);Muscle weakness (generalized) (M62.81)    Functional Assessment Tool Used: AM-PAC 6 Clicks Basic Mobility;Clinical judgement Functional Limitation: Mobility: Walking and moving around Mobility: Walking and Moving Around Current Status (H9977): At least 60 percent but less than 80 percent impaired, limited or restricted Mobility: Walking and Moving Around Goal Status [phone]): At least 40 percent but less than 60 percent impaired, limited or restricted    Time: 0835-0913 PT Time Calculation (min) (ACUTE ONLY): 38 min   Charges:   PT Evaluation $PT Eval Low Complexity: 1 Procedure PT Treatments $Therapeutic Activity: 8-22 mins   PT G Codes:   PT G-Codes **NOT FOR INPATIENT CLASS** Functional Assessment Tool Used: AM-PAC 6 Clicks Basic Mobility;Clinical judgement Functional Limitation: Mobility: Walking and  moving around Mobility: Walking and Moving Around Current Status (T5320): At least 60 percent but less than 80 percent impaired, limited or restricted Mobility: Walking and Moving Around Goal Status [phone]): At least 40 percent but less than 60 percent impaired, limited or restricted     Beth Sanyiah Kanzler, PT, DPT X: [phone]

## 2016-11-22 NOTE — Consult Note (Signed)
[facility] Nurse wound consult note Reason for Consult: CHF exacerbation.  Chronic venous stasis with chronic skin changes to bilateral lower legs.  Scattered scabbed abrasions (0.2 cm ) but no open stasis wounds.  States she uses vaseline daily on her legs and cannot tolerate compression.  Is notably short of breath on oxygen via [place] this AM.  Family member at bedside agrees with this.  Wound type:Chronic venous stasis.  Pressure Injury POA: N/a Measurement:Scattered nonintact abrasions  No drainage present.  Wound VXB:LTJQZES Drainage (amount, consistency, odor) none Periwound:Edema and chronic skin changes Dressing procedure/placement/frequency:Cleanse bilateral lower legs with soap and water daily.  Pat gently dry.  Apply Amlactin cream daily.  Open to air.  Will not follow at this time.  Please re-consult if needed.  Domenic Moras RN BSN [place] Pager [phone]

## 2016-11-22 NOTE — Progress Notes (Signed)
Pt c/o chest pressure and ears ringing. 12 lead EKG ordered STAT per protocol.

## 2016-11-22 NOTE — Care Management Note (Addendum)
Case Management Note  Patient Details  Name: [name] [name] MRN: [id number] Date of Birth: 1946-07-26  Subjective/Objective:   Patient adm from home with acute CHF. She lives in [place] with her daughters. She has been recommended for SNF, but declines. CM offered Home health, patient states her daughter is a cardiology NP and know what she needs. She reports using a electric scooter to get around the house and using a walker for very short distances. She states her daughter helps her bathe. She reports a WC has been ordered for her. She states she has a CPAP provided by [facility]. No oxygen PTA.                  Action/Plan: CM will follow for needs. ? Oxygen needs   11/24/2016: CM still following for needs, per Cardiology note, patient will need diuresis through the weekend. If patient does DC over weekend and needs oxygen, see treatment team sticky note.  11/28/2016: Patient agreeable to [facility] services. CM contacted [facility], they do cover patient's area of residence ([place], [place]). CM faxed orders to [facility] and will alert them when patient discharges.    Expected Discharge Date:        11/25/2016          Expected Discharge Plan:  Home/Self Care  In-House Referral:     Discharge planning Services  CM Consult  Post Acute Care Choice:    Choice offered to:     DME Arranged:    DME Agency:     HH Arranged:    HH Agency:     Status of Service:  In process, will continue to follow  If discussed at Long Length of Stay Meetings, dates discussed:    Additional Comments:  Lyn Joens, Chauncey [place], RN 11/22/2016, 11:16 AM

## 2016-11-22 NOTE — Evaluation (Signed)
Occupational Therapy Evaluation Patient Details Name: [name] [name] MRN: [id number] DOB: Sep 08, 1946 Today's Date: 11/22/2016    History of Present Illness [name] [name] is a 71 y.o. 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  She reports she previously spoke to her cardiologist about this and ultimately called her primary care physician but states she did not feel she was heard (of note in the last cardiology office note she was offered metolazone for her heart failure but she declined).  She then states her symptoms truly began over six months ago but have gradually worsened.  She has noticed an increase in her edema in the past three months.  She reports her shortness of breath is so significant that standing up to pivot to the toilet makes her short of breath.  She reports that she has chronic kidney failure and that she feels as though everything is so intertwined she is worried about trying any new medication because she is worried that it will affect one of her failing organs.  She mentions that she was told only within the past year she has cirrhosis. She has chronic peripheral edema and venous insufficiency.  Last echocardiogram was in February 2016 which showed a left ventricle: The cavity size was normal. There was mild concentric hypertrophy. Systolic function was severely reduced. The estimated ejection fraction was in the range of 25% to 30%.   Clinical Impression   Pt in bed upon therapy arrival and initially resistant to participate in OT and PT evaluation. Patient was anxious during periods of  evaluation especially during transfer to recliner and discussion of discharge planning. [name] [name] was present for part of evaluation who assisted with easing patient's fear. At this time, patient has increased SOB during any type of functional mobility task and requires increased time to regain her breath in order to proceed. Patient has a very supportive family that lives above her although both daughters work long hours and are not home during the day. Patient reports that they have been discussing getting help from an aide to come in for a few hours for bathing and dressing help. If patient discharges today she would not be safe to return home without some rehab from a SNF. Patient strongly states that she does not want to discharge to a SNF. With this in mind, I recommend HHOT at discharge to increase functional performance during BADL and transfer skills. OT will provided skilled services while admitted to focus on these deficits.     Follow Up Recommendations  SNF;Home health OT    Equipment Recommendations  None recommended by OT       Precautions / Restrictions Precautions Precautions: Fall Precaution Comments: Due to immobility.  Restrictions Weight Bearing Restrictions: No      Mobility Bed Mobility Overal bed mobility: Needs Assistance Bed Mobility: Supine to Sit     Supine to sit: +2 for physical assistance;+2 for safety/equipment;HOB elevated;Total assist     General bed mobility comments: Increased assistance required for overall bed mobility due to increased pain in BLEs, SOB, and anxiety.   Transfers Overall transfer level: Needs assistance Equipment used: 2 person hand held assist;None  Transfers: Sit to/from Stand Sit to Stand: +2 safety/equipment;+2 physical assistance;Mod assist;From elevated surface         General transfer comment: Increased anxiety during transfer. VC to calm down and complete transfer safely. Patient prefered hand held assistance to pull  herself up versus using arms to stand. Slow laboring small steps taken to the left to transfer to recliner.          ADL Overall ADL's : Needs assistance/impaired                     Lower Body Dressing: Total assistance;Bed level Lower Body Dressing Details (indicate cue type and reason): donning hospital socks             Functional mobility during ADLs: +2 for physical assistance;Moderate assistance;Cueing for safety;Cueing for sequencing;+2 for safety/equipment       Vision Baseline Vision/History: No visual deficits Patient Visual Report: No change from baseline              Pertinent Vitals/Pain Pain Assessment: 0-10 Pain Score: 7  Pain Location: continuous - everywhere, but mostly hips/knees/shoulders/low back. - Hurting Pain Descriptors / Indicators:  ("It hurts.") Pain Intervention(s): Limited activity within patient's tolerance;Monitored during session;Repositioned     Hand Dominance Right   Extremity/Trunk Assessment Upper Extremity Assessment Upper Extremity Assessment: RUE deficits/detail RUE Deficits / Details: Decreased RUE shoulder ROM which patient reports is due to arthritis. Patient is able to complete shoulder flexion through less than 10% range. Functional gross grasp bilaterally. LUE strength is 4+/5 in all ranges.   Lower Extremity Assessment Lower Extremity Assessment: Defer to PT evaluation       Communication Communication Communication: No difficulties   Cognition Arousal/Alertness: Awake/alert Behavior During Therapy: Anxious Overall Cognitive Status: Within Functional Limits for tasks assessed                                Home Living   Living Arrangements: Children (2 daughters)   Type of Home: House Home Access: Level entry     Home Layout: Two level;Able to live on main level with bedroom/bathroom     Bathroom Shower/Tub: Tub/shower unit   Bathroom Toilet: Handicapped height     Home  Equipment: Tub bench;Walker - 2 wheels;Bedside commode;Cane - single point;Electric scooter;Hospital bed;Other (comment) (Manual w/c has been ordered. Has a hospital bed although she states it is too high and she unable to get into it.)          Prior Functioning/Environment Level of Independence: Independent with assistive device(s)  Gait / Transfers Assistance Needed: Pt uses the scooter for mobility in the house.  She is able to use the RW to transfer and ambulate very short distances (41f) ADL's / Homemaking Assistance Needed: Pt is independent with sponge bathing, and dressing. Reports that she has not been able to get into the shower to bath in the past few months due to SOB.            OT Problem List: Pain;Decreased activity tolerance;Obesity;Decreased strength      OT Treatment/Interventions: Self-care/ADL training;Therapeutic exercise;Therapeutic activities;Energy conservation;DME and/or AE instruction;Patient/family education    OT Goals(Current goals can be found in the care plan section) Acute Rehab OT Goals Patient Stated Goal: to go home OT Goal Formulation: With patient Time For Goal Achievement: 11/29/16 Potential to Achieve Goals: Good  OT Frequency: Min 2X/week  Co-evaluation PT/OT/SLP Co-Evaluation/Treatment: Yes Reason for Co-Treatment: Complexity of the patient's impairments (multi-system involvement);For patient/therapist safety;To address functional/ADL transfers   OT goals addressed during session: Strengthening/ROM      End of Session Equipment Utilized During Treatment: Gait belt  Activity Tolerance: Patient limited by pain;Other (comment) (increased anxiety) Patient left: in chair;with call bell/phone within reach;with family/visitor present;with nursing/sitter in room  OT Visit Diagnosis: Muscle weakness (generalized) (M62.81)                ADL either performed or assessed with clinical judgement  Time: 9996-7227 OT Time  Calculation (min): 38 min Charges:  OT General Charges $OT Visit: 1 Procedure OT Evaluation $OT Eval Low Complexity: 1 Procedure G-Codes:     Ailene Ravel, OTR/L,CBIS  [phone]   Auda Finfrock, Clarene Duke 11/22/2016, 9:30 AM

## 2016-11-22 NOTE — Consult Note (Signed)
Cardiology Consultation   Patient ID: [name] [name]; [id number]; 12/06/1945   Admit date: 11/21/2016 Date of Consult: 11/22/2016  Referring MD:  Dr. Waldron Labs Cardiologist: Dr. Harrington Challenger EPS: Dr. Lovena Le Consulting Cardiologist: Dr. Domenic Polite  Patient Care Team: [name], MD as PCP - General (Family Medicine) Evans Lance, MD (Cardiology) Danie Binder, MD as Consulting Physician (Gastroenterology)    Reason for Consultation: CHF   History of Present Illness: [name] [name] is a 71 y.o. 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 She has hypertension, and has refused to take any anticoagulation other than aspirin for the past 13 years. She continues to have chronic peripheral edema and venous insufficiency. 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 She has class IIl systolic/diastolic heart failure symptoms. 2D Echo 2016 LVEF 25-30% with a restrictive pattern and grade 3 DD.  Saw Dr. Lovena Le 10/26/16 with worsening peripheral edema but refused metolazone. Patient now presents with worsening CHF and hypoxia receiving Lasix 80 mg IV in the ER and O2. Troponins flat at 0.03, creatinine 2.0, d-dimer 5.35, chest x-ray CHF. I/O's negative 480 cc.  Patient says she weighed 273 lbs 07/2016, 293 lbs 10/26/16, and now is 304 lbs. Felt she was up 20 lbs when she saw Dr. Lovena Le and it progressively worsened. Watches salt closely. Just doesn't want any new meds b/c she says they make her sick. Denies chest pain, palpitations, dizziness or presyncope. Pacer working fine 10/26/16.   Past Medical History:  Diagnosis Date  . Anemia   . Atrial fibrillation ([place])   . Bursitis   . Cancer ([place])   . Cataract   . Cervical cancer ([place]) 1991   s/p hysterectomy  . Chronic cellulitis   . Chronic systolic congestive heart failure (HCC)    LVEF 25-30% with  restrictive diastolic filling  . Degenerative joint disease   . Diverticulitis   . Essential hypertension   . Gallstones   . GERD (gastroesophageal reflux disease)   . Gout   . Kidney stones   . Morbid obesity ([place])   . Osteoarthritis   . Sinoatrial node dysfunction (HCC)    Status post PPM - Dr. Lovena Le  . Sleep apnea    CPAP    Past Surgical History:  Procedure Laterality Date  . ABDOMINAL HYSTERECTOMY  1991  . COLONOSCOPY  1998   one polyp per patient  . EYE SURGERY    . PACEMAKER INSERTION  Nov 2000   [employer] with revision in 2011  . PERCUTANEOUS NEPHROLITHOTOMY  April 2012      Home Meds: Prior to Admission medications   Medication Sig Start Date End Date Taking? Authorizing Provider  allopurinol (ZYLOPRIM) 300 MG tablet Take 300 mg by mouth daily.     Yes Historical Provider, MD  aspirin 325 MG tablet Take 325 mg by mouth daily.     Yes Historical Provider, MD  calcitRIOL (ROCALTROL) 0.25 MCG capsule Take 0.25 mcg by mouth daily.   Yes Historical Provider, MD  COD LIVER OIL PO Take 1 tablet by mouth daily.     Yes Historical Provider, MD  docusate sodium (COLACE) 100 MG capsule Take 100 mg by mouth daily as needed for mild constipation.   Yes Historical Provider, MD  ferrous sulfate 325 (65 FE) MG tablet Take 325 mg by mouth every evening.    Yes Historical Provider, MD  furosemide (LASIX) 40 MG tablet TAKE 1.5 TABLETS (60 MG TABLETS BY MOUTH 2 (TWO) TIMES DAILY   Yes Historical Provider, MD  hydrALAZINE (APRESOLINE) 25 MG tablet Take 0.5 tablets (12.5 mg total) by mouth 3 (three) times daily. 11/17/15  Yes Fay Records, MD  HYDROcodone-acetaminophen (NORCO) 10-325 MG per tablet Take 1 tablet by mouth 3 (three) times daily.     Yes Historical Provider, MD  isosorbide mononitrate (IMDUR) 30 MG 24 hr tablet Take 1 tablet (30 mg total) by mouth daily. 09/11/16  Yes Brittainy Erie Noe, PA-C  Multiple Vitamin (MULTIVITAMIN) tablet Take 1 tablet by mouth daily.   Yes  Historical Provider, MD  omeprazole (PRILOSEC) 20 MG capsule Take 20 mg by mouth daily.     Yes Historical Provider, MD  potassium chloride SA (K-DUR,KLOR-CON) 20 MEQ tablet Take 1 tablet (20 mEq total) by mouth 2 (two) times daily. 11/13/16  Yes Evans Lance, MD  vitamin B-12 (CYANOCOBALAMIN) 500 MCG tablet Take 500 mcg by mouth daily.   Yes Historical Provider, MD    Current Medications: . aspirin  325 mg Oral Daily  . enoxaparin (LOVENOX) injection  60 mg Subcutaneous Q24H  . ferrous sulfate  325 mg Oral QPM  . furosemide  40 mg Intravenous BID  . HYDROcodone-acetaminophen  1 tablet Oral TID  . isosorbide mononitrate  30 mg Oral Daily  . nystatin   Topical TID  . pantoprazole  40 mg Oral Daily  . potassium chloride SA  20 mEq Oral BID  . vitamin B-12  500 mcg Oral Daily     Allergies:    Allergies  Allergen Reactions  . Cephalexin Shortness Of Breath    sob  . Codeine Anaphylaxis    REACTION: throat swelling  . Contrast Media [Iodinated Diagnostic Agents] Other (See Comments)    Patient states "I have chronic kidney disease so the doctor said no dye in my veins."  . Coreg [Carvedilol] Other (See Comments)    Beta Blockers cause her organs to shut down per patient  . Peppermint Flavor Shortness Of Breath  . Prednisone Anaphylaxis, Shortness Of Breath and Swelling    REACTION: swelling, S.O.B.  . Tape Other (See Comments)    States plastic tape blisters her skin  . Ciprofloxacin Hives  . Latex Itching and Rash  . Penicillins Hives    Social History:   The patient  reports that she quit smoking about 37 years ago. Her smoking use included Cigarettes. She smoked 1.00 pack per day. She has never used smokeless tobacco. She reports that she does not drink alcohol or use drugs.    Family History:   The patient's family history includes Colon cancer in her other; Diabetes in her sister; Heart attack in her father; Stroke in her mother.   ROS:  Please see the history of  present illness.  Review of Systems  Constitution: Positive for weakness, malaise/fatigue and weight gain.  HENT: Negative.   Eyes: Negative.   Cardiovascular: Positive for dyspnea on exertion, irregular heartbeat and orthopnea.  Respiratory: Positive for shortness of breath and sleep disturbances due to breathing.   Hematologic/Lymphatic: Negative.   Musculoskeletal: Positive for muscle weakness and myalgias. Negative for joint pain.  Gastrointestinal: Negative.   Genitourinary: Negative.    All other ROS reviewed and negative.      Vital Signs: Blood pressure 136/74, pulse 82, temperature 98.7 F (37.1 C), temperature source Oral, resp. rate 18, height 5' 6"  (1.676 m), weight Marland Kitchen)  304 lb (137.9 kg), SpO2 98 %.   PHYSICAL EXAM: General:  Obese, in no acute distress  HEENT: normal Lymph: no adenopathy Neck: Increased JVD Endocrine:  No thryomegaly Vascular: No carotid bruits; FA pulses 2+ bilaterally without bruits  Cardiac:  Irreg; normal S1, S2; 2/6 sys murmur LSB,no rub, bruit, thrill, or heave Lungs:  clear to auscultation bilaterally, no wheezing, rhonchi or rales  Abd: soft, nontender, no hepatomegaly  Ext: 4 plus edema up to abdomen, Decreased distal pulses bilaterally Musculoskeletal:  No deformities, BUE and BLE strength normal and equal Skin: warm and dry  Neuro:  CNs 2-12 intact, no focal abnormalities noted Psych:  Normal affect    EKG:  Ventricular bigeminy-personally reveiwed  Telemetry: NSR with PVC's, bigeminy  Labs:  Recent Labs  11/21/16 1605 11/21/16 2132 11/22/16 0402  TROPONINI 0.03* 0.03* 0.03*   Lab Results  Component Value Date   WBC 6.9 11/21/2016   HGB 11.5 (L) 11/21/2016   HCT 34.9 (L) 11/21/2016   MCV 96.1 11/21/2016   PLT 138 (L) 11/21/2016    Recent Labs Lab 11/21/16 1307 11/22/16 0402  NA 139 140  K 4.4 3.9  CL 100* 101  CO2 29 31  BUN 40* 40*  CREATININE 1.93* 2.00*  CALCIUM 9.5 9.2  PROT 7.4  --   BILITOT 0.8  --     ALKPHOS 65  --   ALT 20  --   AST 33  --   GLUCOSE 122* 91   Lab Results  Component Value Date   CHOL 142 12/14/2014   HDL 45.90 12/14/2014   LDLCALC 68 12/14/2014   TRIG 141.0 12/14/2014   Lab Results  Component Value Date   DDIMER (H) 11/27/2010    5.35        AT THE INHOUSE ESTABLISHED CUTOFF VALUE OF 0.48 ug/mL FEU, THIS ASSAY HAS BEEN DOCUMENTED IN THE LITERATURE TO HAVE A SENSITIVITY AND NEGATIVE PREDICTIVE VALUE OF AT LEAST 98 TO 99%.  THE TEST RESULT SHOULD BE CORRELATED WITH AN ASSESSMENT OF THE CLINICAL PROBABILITY OF DVT / VTE.    Radiology/Studies:  Dg Chest 2 View  Result Date: 11/21/2016 CLINICAL DATA:  Chest tightness. EXAM: CHEST  2 VIEW COMPARISON:  04/05/2016. FINDINGS: Cardiac pacer with lead tip over the right ventricle. Cardiomegaly with pulmonary vascular prominence and bilateral interstitial prominence with right side pleural effusion. Findings consistent with congestive heart failure. No pneumothorax. IMPRESSION: 1. Cardiac pacer with lead tip over the right ventricle. 2. Congestive heart failure with pulmonary venous congestion and right-sided pleural effusion . Electronically Signed   By: Marcello Moores  Register   On: 11/21/2016 13:52   Stress test 02/2015 Perfusion Summary Defect 1:  There is a small defect of moderate severity present in the mid inferior, apical inferior and apex location. The defect is non-reversible.  2-D echo 2016 Study Conclusions  - Left ventricle: The cavity size was normal. There was mild   concentric hypertrophy. Systolic function was severely reduced.   The estimated ejection fraction was in the range of 25% to 30%.   Diffuse hypokinesis. Doppler parameters are consistent with a   restrictive pattern, indicative of decreased left ventricular   diastolic compliance and/or increased left atrial pressure (grade   3 diastolic dysfunction). - Ventricular septum: Septal motion showed paradox. - Aortic valve: Trileaflet; mildly  thickened, mildly calcified   leaflets. Mean gradient (S): 11 mm Hg. Peak gradient (S): 20 mm   Hg. Valve area (VTI): 1.43 cm^2. Valve area (  Vmax): 1.41 cm^2.   Valve area (Vmean): 1.36 cm^2. - Mitral valve: Calcified annulus. Mildly thickened leaflets .   There was moderate regurgitation. - Left atrium: The atrium was severely dilated. - Right ventricle: Systolic function was normal. - Tricuspid valve: There was moderate regurgitation. - Pulmonary arteries: Systolic pressure was within the normal   range. PA peak pressure: 35 mm Hg (S). - Pericardium, extracardiac: There was no pericardial effusion.   PROBLEM LIST:  Principal Problem:   Acute CHF (congestive heart failure) (HCC) Active Problems:   OSA (obstructive sleep apnea)   Essential hypertension   Atrial fibrillation (HCC)   Chronic combined systolic and diastolic CHF (congestive heart failure) (HCC)   Chronic renal failure   Liver cirrhosis secondary to NASH [facility])     ASSESSMENT AND PLAN:  Acute on chronic systolic/diastolic CHF LVEF 81-01% 7510. Repeat echo pending. Only diuresed 480 cc's after Lasix 80 mg IV. Needs aggressive diuresis. Will increase to 80 mg IV BID also on metolazone 2.5 mg daily. Watch renal.  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 She refuses beta blocker therapy, has been on hydralazine and Imdur as an outpatient. Not on ACE inhibitor or ARB with renal insufficiency.  D dimer elevated. At risk for PE, but doubt chest CT can be obtained due to renal insufficiency. She has consistently declined anticoagulation over time (for atrial fibrillation).  History of PAF status post AVN ablation has refused anticoagulation for 13 years.  Complete heart block after ablation status post pacemaker. Pacer check normal 10/26/16  Essential hypertension  Chronic renal failure  Liver cirrhosis secondary to [facility]  OSA   Signed, Ermalinda Barrios, PA-C  11/22/2016 7:57 AM    Attending note:  Patient seen and  examined. Reviewed records and discussed the case with Ms. Bonnell Public PA-C. Agree with her above assessment. Ms. Silos has a history of nonischemic cardiomyopathy with LVEF 25-30% as of 2016, atrial fibrillation status post AV node ablation with pacemaker in place. She follows with Dr. Harrington Challenger and Dr. Lovena Le. She has developed fluid overload with combined heart failure symptoms over the last several weeks, weight up approximately 20-30 pounds. She reports compliance with Lasix, increased the dose on her own within the last week. She declined using metolazone which had been recommended by Dr. Lovena Le. Recent pacer check showed normal function.  On examination she has shortness of breath, decreased breath sounds at the right base with scattered crackles in the lower lung fields, cardiac exam with intermittent ectopic beats, substantial lower extremity edema that appears chronic and also increased abdominal girth. Lab work reveals creatinine 2.0, troponin I levels 0.03, BNP 133, hemoglobin 11.5. Chest x-ray shows a right-sided pleural effusion as well as bilateral interstitial edema.  Patient presents with acute on chronic combined heart failure with significant volume overload. Cardiac regimen is limited, she refuses anticoagulation for atrial fibrillation and also beta blockers in general, also not on ACE inhibitor or ARB with renal insufficiency. Details of her prior allergy to Coreg or not at all clear based on chart review and she does not recall the specifics. At this point recommend converting to IV Lasix, increasing dose today to hopefully achieve further diuresis. Also start metolazone. Continue hydralazine and nitrate combination. Follow-up echocardiogram pending. It will likely take several days to improve her volume status. She remains at risk for further cardiac decompensation.  Satira Sark, M.D., F.A.C.C.

## 2016-11-22 NOTE — Progress Notes (Signed)
PROGRESS NOTE                                                                                                                                                                                                             Patient Demographics:    [name] [name], is a 71 y.o. female, DOB - 1945-10-05, JIR:[id number]  Admit date - 11/21/2016   Admitting Physician Eber Jones, MD  Outpatient Primary MD for the patient is [name], MD  LOS - 1  Outpatient Specialists: Cardiology Dr Brynda Peon.  Chief Complaint  Patient presents with  . Shortness of Breath       Brief Narrative   71 y.o. 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   Subjective:    [name] [name] today has, No headache, No chest pain, No abdominal pain - No Nausea, Complaints of dyspnea, denies any chest pain nausea or vomiting  Assessment  & Plan :    Principal Problem:   Acute CHF (congestive heart failure) (HCC) Active Problems:   OSA (obstructive sleep apnea)   Essential hypertension   Atrial fibrillation (HCC)   Chronic combined systolic and diastolic CHF (congestive heart failure) (HCC)   Chronic renal failure   Liver cirrhosis secondary to NASH ([place])  Acute on chronic combined systolic/diastolic CHF -Most recent echo with EF 25-30% in 2016, repeat 2-D echo pending - IV diuresis per cardiology, then increased given low urine output on 40 mg IV twice a day, monitor daily weight and strict ins and outs closely - Patient has been refusing beta blockers in the past, NO  ARB/ACEI given renal failure., Continue with Imdur and   History of atrial  fibrillation - Has refused anticoagulation in the past, status post AVN and pacemaker  Complete heart block - Status post permanent pacemaker  OSA - Continue Bipap QHS  CKD stage III - At baseline, continue to monitor closely as on IV diuresis  Liver cirrhosis secondary to [name] - LFTs within normal limits  Candidal skin infection - Continue with Nystatin powder, will start on Diflucan  Hypertension - Blood  pressure is acceptable, continue with Imdur and hydralazine   Code Status : Full  Family Communication  : none at bedside  Disposition Plan  : pending further work up  Consults  :  Cardiology  Procedures  : none  DVT Prophylaxis  :  Lovenox  SCDs   Lab Results  Component Value Date   PLT 138 (L) 11/21/2016    Antibiotics  :    Anti-infectives    None        Objective:   Vitals:   11/21/16 1430 11/21/16 1518 11/22/16 0622 11/22/16 0838  BP: 142/73 133/79 136/74   Pulse: (!) 48 97 82 (!) 102  Resp: 21 18 18    Temp:  97.8 F (36.6 C) 98.7 F (37.1 C)   TempSrc:  Oral Oral   SpO2: 97% 97% 98% 96%  Weight:  (!) 137.3 kg (302 lb 9.6 oz) (!) 137.9 kg (304 lb)   Height:  5' 6"  (1.676 m)      Wt Readings from Last 3 Encounters:  11/22/16 (!) 137.9 kg (304 lb)  10/26/16 132.9 kg (293 lb)  08/10/16 123.8 kg (273 lb)     Intake/Output Summary (Last 24 hours) at 11/22/16 1220 Last data filed at 11/22/16 0500  Gross per 24 hour  Intake              120 ml  Output              600 ml  Net             -480 ml     Physical Exam  Awake Alert, Oriented X 3, No new F.N deficits, Normal affect Supple Neck,+ JVD,  Symmetrical Chest wall movement, diminished air movement bilaterally, no wheezing Irreg,No Gallops,Rubs or new Murmurs, No Parasternal Heave +ve B.Sounds, Abd Soft, No tenderness, No rebound - guarding or rigidity. No Cyanosis, Clubbing , +2/3 edema, chronic lower extremity skin discoloration, as well as candidal rash and abdominal  folds    Data Review:    CBC  Recent Labs Lab 11/21/16 1307  WBC 6.9  HGB 11.5*  HCT 34.9*  PLT 138*  MCV 96.1  MCH 31.7  MCHC 33.0  RDW 15.3  LYMPHSABS 1.2  MONOABS 0.6  EOSABS 0.2  BASOSABS 0.0    Chemistries   Recent Labs Lab 11/21/16 1307 11/22/16 0402  NA 139 140  K 4.4 3.9  CL 100* 101  CO2 29 31  GLUCOSE 122* 91  BUN 40* 40*  CREATININE 1.93* 2.00*  CALCIUM 9.5 9.2  AST 33  --   ALT 20  --   ALKPHOS 65  --   BILITOT 0.8  --    ------------------------------------------------------------------------------------------------------------------ No results for input(s): CHOL, HDL, LDLCALC, TRIG, CHOLHDL, LDLDIRECT in the last 72 hours.  Lab Results  Component Value Date   HGBA1C (H) 12/06/2010    6.2 (NOTE)                                                                       According to the ADA Clinical Practice Recommendations for 2011, when HbA1c is used as a screening test:   >=6.5%   Diagnostic of Diabetes Mellitus           (  if abnormal result  is confirmed)  5.7-6.4%   Increased risk of developing Diabetes Mellitus  References:Diagnosis and Classification of Diabetes Mellitus,Diabetes Care,2011,34(Suppl 1):S62-S69 and Standards of Medical Care in         Diabetes - 2011,Diabetes CXFQ,7225,75  (Suppl 1):S11-S61.   ------------------------------------------------------------------------------------------------------------------ No results for input(s): TSH, T4TOTAL, T3FREE, THYROIDAB in the last 72 hours.  Invalid input(s): FREET3 ------------------------------------------------------------------------------------------------------------------ No results for input(s): VITAMINB12, FOLATE, FERRITIN, TIBC, IRON, RETICCTPCT in the last 72 hours.  Coagulation profile No results for input(s): INR, PROTIME in the last 168 hours.  No results for input(s): DDIMER in the last 72 hours.  Cardiac Enzymes  Recent Labs Lab 11/21/16 1605 11/21/16 2132  11/22/16 0402  TROPONINI 0.03* 0.03* 0.03*   ------------------------------------------------------------------------------------------------------------------    Component Value Date/Time   BNP 133.0 (H) 11/21/2016 1307   BNP 122.0 (H) 04/13/2014 1405    Inpatient Medications  Scheduled Meds: . ammonium lactate   Topical Daily  . aspirin  325 mg Oral Daily  . enoxaparin (LOVENOX) injection  60 mg Subcutaneous Q24H  . ferrous sulfate  325 mg Oral QPM  . furosemide  80 mg Intravenous BID  . hydrALAZINE  12.5 mg Oral BID  . HYDROcodone-acetaminophen  1 tablet Oral TID  . isosorbide mononitrate  30 mg Oral Daily  . nystatin   Topical TID  . pantoprazole  40 mg Oral Daily  . potassium chloride SA  20 mEq Oral BID  . vitamin B-12  500 mcg Oral Daily   Continuous Infusions: PRN Meds:.  Micro Results No results found for this or any previous visit (from the past 240 hour(s)).  Radiology Reports Dg Chest 2 View  Result Date: 11/21/2016 CLINICAL DATA:  Chest tightness. EXAM: CHEST  2 VIEW COMPARISON:  04/05/2016. FINDINGS: Cardiac pacer with lead tip over the right ventricle. Cardiomegaly with pulmonary vascular prominence and bilateral interstitial prominence with right side pleural effusion. Findings consistent with congestive heart failure. No pneumothorax. IMPRESSION: 1. Cardiac pacer with lead tip over the right ventricle. 2. Congestive heart failure with pulmonary venous congestion and right-sided pleural effusion . Electronically Signed   By: Marcello Moores  Register   On: 11/21/2016 13:52     Waldron Labs, DAWOOD M.D on 11/22/2016 at 12:20 PM  Between 7am to 7pm - Pager - [phone]  After 7pm go to [email or web address] - password [facility]  [facility] -  Office  [phone]

## 2016-11-22 NOTE — Progress Notes (Signed)
**Note De-Identified Roxene Alviar Obfuscation** STAT EKG completed and reported to RN

## 2016-11-22 NOTE — Progress Notes (Signed)
*  PRELIMINARY RESULTS* Echocardiogram 2D Echocardiogram has been performed.  Leavy Cella 11/22/2016, 2:34 PM

## 2016-11-23 LAB — BASIC METABOLIC PANEL
ANION GAP: 11 (ref 5–15)
BUN: 42 mg/dL — ABNORMAL HIGH (ref 6–20)
CO2: 33 mmol/L — ABNORMAL HIGH (ref 22–32)
Calcium: 9.1 mg/dL (ref 8.9–10.3)
Chloride: 96 mmol/L — ABNORMAL LOW (ref 101–111)
Creatinine, Ser: 1.96 mg/dL — ABNORMAL HIGH (ref 0.44–1.00)
GFR calc Af Amer: 28 mL/min — ABNORMAL LOW (ref >60)
GFR, EST NON AFRICAN AMERICAN: 25 mL/min — AB (ref >60)
Glucose, Bld: 120 mg/dL — ABNORMAL HIGH (ref 65–99)
POTASSIUM: 3.6 mmol/L (ref 3.5–5.1)
SODIUM: 140 mmol/L (ref 135–145)

## 2016-11-23 LAB — CBC
HEMATOCRIT: 32.3 % — AB (ref 36.0–46.0)
HEMOGLOBIN: 10.5 g/dL — AB (ref 12.0–15.0)
MCH: 31.7 pg (ref 26.0–34.0)
MCHC: 32.5 g/dL (ref 30.0–36.0)
MCV: 97.6 fL (ref 78.0–100.0)
Platelets: 123 10*3/uL — ABNORMAL LOW (ref 150–400)
RBC: 3.31 MIL/uL — AB (ref 3.87–5.11)
RDW: 15.3 % (ref 11.5–15.5)
WBC: 5.3 10*3/uL (ref 4.0–10.5)

## 2016-11-23 MED ORDER — ALLOPURINOL 300 MG PO TABS: 300.0000 mg | ORAL_TABLET | Freq: Every day | ORAL | Status: DC

## 2016-11-23 MED ORDER — FUROSEMIDE 10 MG/ML IJ SOLN
80.0000 mg | Freq: Two times a day (BID) | INTRAMUSCULAR | Status: DC
  Administered 2016-11-23 – 2016-12-02 (×19): 80 mg via INTRAVENOUS
  Filled 2016-11-23 (×19): qty 8

## 2016-11-23 MED ORDER — ALLOPURINOL 300 MG PO TABS
300.0000 mg | ORAL_TABLET | Freq: Every day | ORAL | Status: DC
  Administered 2016-11-23 – 2016-12-02 (×11): 300 mg via ORAL
  Filled 2016-11-23 (×10): qty 1

## 2016-11-23 MED ORDER — POTASSIUM CHLORIDE CRYS ER 20 MEQ PO TBCR: 40.0000 meq | EXTENDED_RELEASE_TABLET | Freq: Four times a day (QID) | ORAL | Status: DC

## 2016-11-23 MED ORDER — POTASSIUM CHLORIDE CRYS ER 20 MEQ PO TBCR
20.0000 meq | EXTENDED_RELEASE_TABLET | Freq: Once | ORAL | Status: AC
  Administered 2016-11-23: 20 meq via ORAL
  Filled 2016-11-23: qty 1

## 2016-11-23 NOTE — Progress Notes (Signed)
PROGRESS NOTE                                                                                                                                                                                                             Patient Demographics:    [name] [name], is a 71 y.o. female, DOB - 06-07-1946, OKH:[id number]  Admit date - 11/21/2016   Admitting Physician Eber Jones, MD  Outpatient Primary MD for the patient is [name], MD  LOS - 2  Outpatient Specialists: Cardiology Dr Brynda Peon.  Chief Complaint  Patient presents with  . Shortness of Breath       Brief Narrative   71 y.o. 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   Subjective:    [name] [name] today has, No headache, No chest pain, No abdominal pain - No Nausea, Complaints of dyspnea, denies any chest pain nausea or vomiting. - She did report some tinnitus yesterday, upon further questioning, reports she's has this problem for last few years, which has been intermittent.  Assessment  & Plan :    Principal Problem:   Acute CHF (congestive heart failure) (HCC) Active Problems:   OSA (obstructive sleep apnea)   Essential hypertension   Atrial fibrillation (HCC)   Chronic combined systolic and diastolic CHF (congestive heart failure) (HCC)   Chronic renal failure   Liver cirrhosis secondary to NASH ([place])  Acute on chronic combined systolic/diastolic CHF -Most recent echo with EF 25-30% in 2016, repeat 2-D echo Showing improved EF to 35 %. There is hypokinesis of the mid-apicalanteroseptal and apical myocardium. - IV diuresis per cardiology, continue  with IV Lasix milligram IV twice a day, -1920 cc over last 24 hours - Patient has been refusing beta blockers in the past, NO  ARB/ACEI given renal failure., Continue with Imdur and hydralazine  History of atrial fibrillation -CHADSVASC Score of 4, Has refused anticoagulation in the past, status post AVN and pacemaker  Complete heart block - Status post permanent pacemaker  OSA - Continue Bipap QHS  CKD stage III - Stable on IV diuresis, continue  to monitor .  Liver cirrhosis secondary to [facility] - LFTs within normal limits  Candidal skin infection - Continue with Nystatin powder, and  Diflucan, continue with local skin care, appears to be improving  Hypertension - Blood pressure is acceptable, continue with Imdur and hydralazine  Tinnitus - This appears to be chronic problem per patient, has been intermittent for a few years now, she was encouraged to see ENT as an outpatient.  Code Status : Full  Family Communication  : none at bedside  Disposition Plan  : Will need another few days on IV diuresis, PT recommending SNF  Consults  :  Cardiology  Procedures  : none  DVT Prophylaxis  :  Lovenox  SCDs   Lab Results  Component Value Date   PLT 123 (L) 11/23/2016    Antibiotics  :    Anti-infectives    Start     Dose/Rate Route Frequency Ordered Stop   11/22/16 1300  fluconazole (DIFLUCAN) tablet 100 mg     100 mg Oral Daily 11/22/16 1223          Objective:   Vitals:   11/22/16 0838 11/22/16 1639 11/22/16 2136 11/23/16 0606  BP:  (!) 145/71 (!) 100/59 140/62  Pulse: (!) 102 (!) 102  99  Resp:  20 20 20   Temp:  98.2 F (36.8 C)  99.3 F (37.4 C)  TempSrc:  Oral  Oral  SpO2: 96% 98% 95% 98%  Weight:    136.1 kg (300 lb)  Height:        Wt Readings from Last 3 Encounters:  11/23/16 136.1 kg (300 lb)  10/26/16 132.9 kg (293 lb)  08/10/16 123.8 kg (273 lb)     Intake/Output Summary (Last 24 hours) at 11/23/16 1036 Last data filed at 11/23/16 0900   Gross per 24 hour  Intake              180 ml  Output             3250 ml  Net            -3070 ml     Physical Exam  Awake Alert, Oriented X 3, No new F.N deficits, Normal affect Supple Neck,+ JVD,  Symmetrical Chest wall movement, diminished air movement bilaterally, no wheezing Irreg,No Gallops,Rubs or new Murmurs, No Parasternal Heave +ve B.Sounds, Abd Soft, No tenderness, No rebound - guarding or rigidity. No Cyanosis, Clubbing , +2/3 edema, chronic lower extremity skin discoloration, as well as candidal rash and abdominal folds    Data Review:    CBC  Recent Labs Lab 11/21/16 1307 11/23/16 0435  WBC 6.9 5.3  HGB 11.5* 10.5*  HCT 34.9* 32.3*  PLT 138* 123*  MCV 96.1 97.6  MCH 31.7 31.7  MCHC 33.0 32.5  RDW 15.3 15.3  LYMPHSABS 1.2  --   MONOABS 0.6  --   EOSABS 0.2  --   BASOSABS 0.0  --     Chemistries   Recent Labs Lab 11/21/16 1307 11/22/16 0402 11/23/16 0435  NA 139 140 140  K 4.4 3.9 3.6  CL 100* 101 96*  CO2 29 31 33*  GLUCOSE 122* 91 120*  BUN 40* 40* 42*  CREATININE 1.93* 2.00* 1.96*  CALCIUM 9.5 9.2 9.1  AST 33  --   --   ALT 20  --   --   ALKPHOS 65  --   --   BILITOT 0.8  --   --    ------------------------------------------------------------------------------------------------------------------  No results for input(s): CHOL, HDL, LDLCALC, TRIG, CHOLHDL, LDLDIRECT in the last 72 hours.  Lab Results  Component Value Date   HGBA1C (H) 12/06/2010    6.2 (NOTE)                                                                       According to the ADA Clinical Practice Recommendations for 2011, when HbA1c is used as a screening test:   >=6.5%   Diagnostic of Diabetes Mellitus           (if abnormal result  is confirmed)  5.7-6.4%   Increased risk of developing Diabetes Mellitus  References:Diagnosis and Classification of Diabetes Mellitus,Diabetes Care,2011,34(Suppl 1):S62-S69 and Standards of Medical Care in         Diabetes -  2011,Diabetes BSWH,6759,16  (Suppl 1):S11-S61.   ------------------------------------------------------------------------------------------------------------------ No results for input(s): TSH, T4TOTAL, T3FREE, THYROIDAB in the last 72 hours.  Invalid input(s): FREET3 ------------------------------------------------------------------------------------------------------------------ No results for input(s): VITAMINB12, FOLATE, FERRITIN, TIBC, IRON, RETICCTPCT in the last 72 hours.  Coagulation profile No results for input(s): INR, PROTIME in the last 168 hours.  No results for input(s): DDIMER in the last 72 hours.  Cardiac Enzymes  Recent Labs Lab 11/21/16 1605 11/21/16 2132 11/22/16 0402  TROPONINI 0.03* 0.03* 0.03*   ------------------------------------------------------------------------------------------------------------------    Component Value Date/Time   BNP 133.0 (H) 11/21/2016 1307   BNP 122.0 (H) 04/13/2014 1405    Inpatient Medications  Scheduled Meds: . allopurinol  300 mg Oral QHS  . ammonium lactate   Topical Daily  . aspirin  325 mg Oral Daily  . enoxaparin (LOVENOX) injection  60 mg Subcutaneous Q24H  . ferrous sulfate  325 mg Oral QPM  . fluconazole  100 mg Oral Daily  . furosemide  80 mg Intravenous BID  . hydrALAZINE  12.5 mg Oral BID  . HYDROcodone-acetaminophen  1 tablet Oral TID  . isosorbide mononitrate  30 mg Oral Daily  . nystatin   Topical TID  . pantoprazole  40 mg Oral Daily  . potassium chloride SA  20 mEq Oral BID  . vitamin B-12  500 mcg Oral Daily   Continuous Infusions: PRN Meds:.  Micro Results No results found for this or any previous visit (from the past 240 hour(s)).  Radiology Reports Dg Chest 2 View  Result Date: 11/21/2016 CLINICAL DATA:  Chest tightness. EXAM: CHEST  2 VIEW COMPARISON:  04/05/2016. FINDINGS: Cardiac pacer with lead tip over the right ventricle. Cardiomegaly with pulmonary vascular prominence and  bilateral interstitial prominence with right side pleural effusion. Findings consistent with congestive heart failure. No pneumothorax. IMPRESSION: 1. Cardiac pacer with lead tip over the right ventricle. 2. Congestive heart failure with pulmonary venous congestion and right-sided pleural effusion . Electronically Signed   By: Marcello Moores  Register   On: 11/21/2016 13:52     [facility], [name] [name] M.D on 11/23/2016 at 10:36 AM  Between 7am to 7pm - Pager - [phone]  After 7pm go to [email or web address] - password [facility]  [facility] -  Office  [phone]

## 2016-11-23 NOTE — Progress Notes (Signed)
Physical Therapy Treatment Patient Details Name: [name] [name] MRN: [id number] DOB: May 04, 1946 Today's Date: 11/23/2016    History of Present Illness [name] [name] is a 71 y.o. 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  She reports she previously spoke to her cardiologist about this and ultimately called her primary care physician but states she did not feel she was heard (of note in the last cardiology office note she was offered metolazone for her heart failure but she declined).  She then states her symptoms truly began over six months ago but have gradually worsened.  She has noticed an increase in her edema in the past three months.  She reports her shortness of breath is so significant that standing up to pivot to the toilet makes her short of breath.  She reports that she has chronic kidney failure and that she feels as though everything is so intertwined she is worried about trying any new medication because she is worried that it will affect one of her failing organs.  She mentions that she was told only within the past year she has cirrhosis. She has chronic peripheral edema and venous insufficiency.  Last echocardiogram was in February 2016 which showed a left ventricle: The cavity size was normal. There was mild concentric hypertrophy. Systolic function was severely reduced. The estimated ejection fraction was in the range of 25% to 30%.    PT Comments    Pt received sitting up on the EOB, and was agreeable to PT tx.  Pt continues to be anxious about mobility, but wants to get into the chair.  She was  able to perform sit<>Stand with bariatric RW, however it required Max effort and she had to rock back and forth several times to gain momentum to be able to stand.  During transfer, she states very strongly "DON'T TOUCH ME" during the transfer.  Assisted pt with getting comfortable in the chair.  Continue to recommend SNF as she requires increased assistance for all functional mobility.  If she decides to go home she would benefit from HHPT, and 24/7 supervision/assistance.      Follow Up Recommendations  SNF;Home health PT;Supervision/Assistance - 24 hour     Equipment Recommendations       Recommendations for Other Services       Precautions / Restrictions Precautions Precautions: Fall Precaution Comments: Due to immobility.  Restrictions Weight Bearing Restrictions: No    Mobility  Bed Mobility                  Transfers Overall transfer level: Needs assistance Equipment used: Rolling walker (2 wheeled) (Bariatric) Transfers: Sit to/from Stand;Stand Pivot Transfers Sit to Stand: Modified independent (Device/Increase time) (Pt expressed very clearly "DON'T TOUCH ME" during transfer, and although she allowed PT to don gait belt, pt did not allow it to be tightened enough where it could be used to assist if needed.  )         General transfer comment: Pt requires increased time, and multiple rocking back and forth to gain momentum and be able to stand.  Once standing she places forearm down on the R side of  the walker instead of the hand.  She states it hurts her shoulder too much because of yesterday.  Pt was able to take a few steps to the left towards the chair to be able to sit in the chair.    Ambulation/Gait                 Stairs            Wheelchair Mobility    Modified Rankin (Stroke Patients Only)       Balance Overall balance assessment: Needs assistance Sitting-balance support: Bilateral upper extremity supported;Feet supported Sitting  balance-Leahy Scale: Good     Standing balance support: Bilateral upper extremity supported Standing balance-Leahy Scale: Fair                      Cognition Arousal/Alertness: Awake/alert Behavior During Therapy: Anxious Overall Cognitive Status: Within Functional Limits for tasks assessed                 General Comments: pt is very specific on where she likes things placed during mobility.      Exercises      General Comments        Pertinent Vitals/Pain Pain Score: 7  Pain Location: shoulder Pain Intervention(s): Limited activity within patient's tolerance;Monitored during session;Repositioned    Home Living                      Prior Function            PT Goals (current goals can now be found in the care plan section) Acute Rehab PT Goals Patient Stated Goal: to go home PT Goal Formulation: With patient/family Time For Goal Achievement: 12/06/16 Potential to Achieve Goals: Fair Progress towards PT goals: Progressing toward goals    Frequency    Min 3X/week      PT Plan Current plan remains appropriate    Co-evaluation             End of Session Equipment Utilized During Treatment: Gait belt;Oxygen Activity Tolerance: Patient limited by fatigue Patient left: in chair;with call bell/phone within reach;with family/visitor present Nurse Communication: Mobility status;Other (comment) (Bariatric RW) PT Visit Diagnosis: Other abnormalities of gait and mobility (R26.89);Muscle weakness (generalized) (M62.81)     Time: 6886-4847 PT Time Calculation (min) (ACUTE ONLY): 24 min  Charges:  $Therapeutic Activity: 23-37 mins                    G Codes:       Beth Kaily Wragg, PT, DPT X: [phone]

## 2016-11-23 NOTE — Progress Notes (Signed)
Progress Note  Patient Name: [name] [name] Date of Encounter: 11/23/2016  Primary Cardiologist: Dr. Harrington Challenger EP: Dr. Lovena Le    Subjective   Feels weak. She is not feeling much better today. Breathing status unchanged per patient. Complains of right shoulder pain.   Inpatient Medications    Scheduled Meds: . allopurinol  300 mg Oral QHS  . ammonium lactate   Topical Daily  . aspirin  325 mg Oral Daily  . enoxaparin (LOVENOX) injection  60 mg Subcutaneous Q24H  . ferrous sulfate  325 mg Oral QPM  . fluconazole  100 mg Oral Daily  . furosemide  80 mg Intravenous BID  . hydrALAZINE  12.5 mg Oral BID  . HYDROcodone-acetaminophen  1 tablet Oral TID  . isosorbide mononitrate  30 mg Oral Daily  . nystatin   Topical TID  . pantoprazole  40 mg Oral Daily  . potassium chloride SA  20 mEq Oral BID  . vitamin B-12  500 mcg Oral Daily    Vital Signs    Vitals:   11/22/16 0838 11/22/16 1639 11/22/16 2136 11/23/16 0606  BP:  (!) 145/71 (!) 100/59 140/62  Pulse: (!) 102 (!) 102  99  Resp:  20 20 20   Temp:  98.2 F (36.8 C)  99.3 F (37.4 C)  TempSrc:  Oral  Oral  SpO2: 96% 98% 95% 98%  Weight:    300 lb (136.1 kg)  Height:        Intake/Output Summary (Last 24 hours) at 11/23/16 0839 Last data filed at 11/22/16 2130  Gross per 24 hour  Intake               60 ml  Output             2500 ml  Net            -2440 ml   Filed Weights   11/21/16 1518 11/22/16 0622 11/23/16 0606  Weight: (!) 302 lb 9.6 oz (137.3 kg) (!) 304 lb (137.9 kg) 300 lb (136.1 kg)    ECG    Tracing from 11/22/2016 showed ventricular pacing with underlying atrial fibrillation and intermittent PVCs. Personally reviewed.  Physical Exam   GEN: No acute distress. Morbidly obese Neck: Increased JVP. Cardiac: IRRR, no gallop.  Respiratory: Diminished in the bases.  GI: Obese with pannus, nontender.  MS: Bilateral chronic appearing LEE edema.  Labs    Chemistry  Recent Labs Lab 11/21/16 1307  11/22/16 0402 11/23/16 0435  NA 139 140 140  K 4.4 3.9 3.6  CL 100* 101 96*  CO2 29 31 33*  GLUCOSE 122* 91 120*  BUN 40* 40* 42*  CREATININE 1.93* 2.00* 1.96*  CALCIUM 9.5 9.2 9.1  PROT 7.4  --   --   ALBUMIN 4.3  --   --   AST 33  --   --   ALT 20  --   --   ALKPHOS 65  --   --   BILITOT 0.8  --   --   GFRNONAA 25* 24* 25*  GFRAA 29* 28* 28*  ANIONGAP 10 8 11      Hematology  Recent Labs Lab 11/21/16 1307 11/23/16 0435  WBC 6.9 5.3  RBC 3.63* 3.31*  HGB 11.5* 10.5*  HCT 34.9* 32.3*  MCV 96.1 97.6  MCH 31.7 31.7  MCHC 33.0 32.5  RDW 15.3 15.3  PLT 138* 123*    Cardiac Enzymes  Recent Labs Lab 11/21/16 1605 11/21/16 2132  11/22/16 0402  TROPONINI 0.03* 0.03* 0.03*     Recent Labs Lab 11/21/16 1315  TROPIPOC 0.02     BNP  Recent Labs Lab 11/21/16 1307  BNP 133.0*     Radiology    Dg Chest 2 View  Result Date: 11/21/2016 CLINICAL DATA:  Chest tightness. EXAM: CHEST  2 VIEW COMPARISON:  04/05/2016. FINDINGS: Cardiac pacer with lead tip over the right ventricle. Cardiomegaly with pulmonary vascular prominence and bilateral interstitial prominence with right side pleural effusion. Findings consistent with congestive heart failure. No pneumothorax. IMPRESSION: 1. Cardiac pacer with lead tip over the right ventricle. 2. Congestive heart failure with pulmonary venous congestion and right-sided pleural effusion . Electronically Signed   By: Marcello Moores  Register   On: 11/21/2016 13:52    Cardiac Studies   Echocardiogram 11/22/2016:  - Procedure narrative: Transthoracic echocardiography. Image   quality was adequate. The study was technically difficult, as a   result of body habitus. - Left ventricle: The cavity size was normal. Wall thickness was   increased in a pattern of mild LVH. The estimated ejection   fraction was 35%. There is hypokinesis of the   mid-apicalanteroseptal and apical myocardium. The study is not   technically sufficient to allow  evaluation of LV diastolic   function. - Aortic valve: Trileaflet; moderately calcified leaflets. There   was mild stenosis. - Mitral valve: Calcified annulus. Mildly thickened leaflets .   There was mild regurgitation. - Left atrium: The atrium was severely dilated. - Right ventricle: Pacer wire or catheter noted in right ventricle. - Right atrium: The atrium was mildly dilated. Central venous   pressure (est): 15 mm Hg. - Atrial septum: No defect or patent foramen ovale was identified. - Tricuspid valve: There was mild regurgitation. - Pulmonary arteries: PA peak pressure: 40 mm Hg (S). - Pericardium, extracardiac: A small pericardial effusion was   identified posterior to the heart. There was a right pleural   effusion.  Patient Profile     71 y.o. female history of nonischemic cardiomyopathy with LVEF 25-30% as of 2016, atrial fibrillation status post AV node ablation with pacemaker in place. She follows with Dr. Harrington Challenger and Dr. Lovena Le. She has developed fluid overload with combined heart failure symptoms over the last several weeks, weight up approximately 20-30 pounds.   Assessment & Plan   1. Acute on Chronic combined CHF: Plan to continue Lasix 80 mg IV twice daily for now, she diuresed approximately 2400 cc since yesterday. Creatinine 1.96 is stable. Wt decreased from 304-300 lbs.   2. Chronic atrial fibrillation: Patient has repeatedly declined anticoaguation therapy. CHADSVASC Score of 4. (Age, Hypertension, Female, and reduced LV fx). She is status post AV node ablation and has a pacemaker in place. Recent device interrogation showed normal function.   3. NICM: LVEF approximately 35% at this point. She refuses beta blocker, not on ACE or ARB due to renal disease. Continues on hydralazine and nitrate.  4. Hypertension: Not optimal for LV function. Likely related to fluid overload. Will continue hydralazine and nitrates.  5. Chronic Renal failure: Creatinine stable with  diuresis.    Signed, Jory Sims, NP 11/23/2016, 8:39 AM     Attending note:  Patient discussed with Ms. Lawrence NP, reviewed interval hospital course including follow-up ECG. She has diuresed approximately 2400 cc with IV Lasix, still not voicing much improvement in overall symptom status. Weight is down a few pounds as well. Creatinine relatively stable at 1.9. Plan is to continue hydralazine  and nitrate combination, she is not on ACE inhibitor or ARB due to renal dysfunction. She also refuses beta blocker and anticoagulation which has been the case in the past on follow-up with Dr. Harrington Challenger and Dr. Lovena Le. Continue IV Lasix at 80 mg twice daily and follow urine output. Expect that it will take several days to get her volume status improved.  Satira Sark, M.D., F.A.C.C.

## 2016-11-24 DIAGNOSIS — I10 Essential (primary) hypertension: Secondary | ICD-10-CM

## 2016-11-24 LAB — BASIC METABOLIC PANEL
Anion gap: 10 (ref 5–15)
BUN: 42 mg/dL — ABNORMAL HIGH (ref 6–20)
CO2: 34 mmol/L — AB (ref 22–32)
Calcium: 9.1 mg/dL (ref 8.9–10.3)
Chloride: 95 mmol/L — ABNORMAL LOW (ref 101–111)
Creatinine, Ser: 1.83 mg/dL — ABNORMAL HIGH (ref 0.44–1.00)
GFR calc Af Amer: 31 mL/min — ABNORMAL LOW (ref >60)
GFR calc non Af Amer: 27 mL/min — ABNORMAL LOW (ref >60)
GLUCOSE: 92 mg/dL (ref 65–99)
POTASSIUM: 3.8 mmol/L (ref 3.5–5.1)
Sodium: 139 mmol/L (ref 135–145)

## 2016-11-24 MED ORDER — BISACODYL 10 MG RE SUPP
10.0000 mg | Freq: Once | RECTAL | Status: DC
  Filled 2016-11-24: qty 1

## 2016-11-24 MED ORDER — SENNOSIDES-DOCUSATE SODIUM 8.6-50 MG PO TABS
2.0000 | ORAL_TABLET | Freq: Two times a day (BID) | ORAL | Status: DC
  Administered 2016-11-25 – 2016-12-02 (×15): 2 via ORAL
  Filled 2016-11-24 (×17): qty 2

## 2016-11-24 MED ORDER — POLYETHYLENE GLYCOL 3350 17 G PO PACK
34.0000 g | PACK | Freq: Two times a day (BID) | ORAL | Status: AC
  Administered 2016-11-24 (×2): 34 g via ORAL
  Filled 2016-11-24 (×2): qty 2

## 2016-11-24 MED ORDER — BISACODYL 5 MG PO TBEC
10.0000 mg | DELAYED_RELEASE_TABLET | Freq: Once | ORAL | Status: AC
  Administered 2016-11-24: 10 mg via ORAL
  Filled 2016-11-24: qty 2

## 2016-11-24 NOTE — Progress Notes (Signed)
PROGRESS NOTE                                                                                                                                                                                                             Patient Demographics:    [name] [name], is a 71 y.o. female, DOB - December 17, 1945, KCL:[id number]  Admit date - 11/21/2016   Admitting Physician Eber Jones, MD  Outpatient Primary MD for the patient is [name], MD  LOS - 3  Outpatient Specialists: Cardiology Dr Brynda Peon.  Chief Complaint  Patient presents with  . Shortness of Breath       Brief Narrative   71 y.o. 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   Subjective:    [name] [name] today has, No headache, No chest pain, No abdominal pain - No Nausea, She complains of constipation today, she tells me her last bowel movement was before 5 days.  Assessment  & Plan :    Principal Problem:   Acute CHF (congestive heart failure) (HCC) Active Problems:   OSA (obstructive sleep apnea)   Essential hypertension   Atrial fibrillation (HCC)   Chronic combined systolic and diastolic CHF (congestive heart failure) (HCC)   Chronic renal failure   Liver cirrhosis secondary to NASH ([place])  Acute on chronic combined systolic/diastolic CHF -Most recent echo with EF 25-30% in 2016, repeat 2-D echo Showing improved EF to 35 %. There is hypokinesis of the mid-apicalanteroseptal and apical myocardium. - IV diuresis per cardiology, continue with IV Lasix 80 mg IV twice a day, - 7.3 L since admission , will need to continue with IV diuresis over the  weekend. - Patient has been refusing beta blockers in the past, NO  ARB/ACEI given renal failure., Continue with Imdur and hydralazine  History of atrial fibrillation -CHADSVASC Score of 4, Has refused anticoagulation , status post AVN and pacemaker  Complete heart block - Status post permanent pacemaker  OSA - Continue Bipap QHS  CKD stage III - Stable on IV diuresis, continue to monitor .  Liver cirrhosis secondary to  [name] [name] - LFTs within normal limits  Candidal skin infection - Continue with Nystatin powder, and  Diflucan, continue with local skin care, appears to be improving  Hypertension - Blood pressure is acceptable, continue with Imdur and hydralazine  Tinnitus - This appears to be chronic problem per patient, has been intermittent for a few years now, she was encouraged to see ENT as an outpatient.  Code Status : Full  Family Communication  : none at bedside  Disposition Plan  : Will need another few days on IV diuresis, PT recommending SNF  Consults  :  Cardiology  Procedures  : none  DVT Prophylaxis  :  Lovenox  SCDs   Lab Results  Component Value Date   PLT 123 (L) 11/23/2016    Antibiotics  :    Anti-infectives    Start     Dose/Rate Route Frequency Ordered Stop   11/22/16 1300  fluconazole (DIFLUCAN) tablet 100 mg     100 mg Oral Daily 11/22/16 1223          Objective:   Vitals:   11/23/16 0606 11/23/16 1609 11/23/16 2202 11/24/16 0556  BP: 140/62 108/74 123/67 140/62  Pulse: 99 99 95 94  Resp: 20 (!) 22 20 20   Temp: 99.3 F (37.4 C) 98.5 F (36.9 C) 98.1 F (36.7 C) 98.2 F (36.8 C)  TempSrc: Oral Oral Oral Oral  SpO2: 98% 100% 99% 100%  Weight: 136.1 kg (300 lb)   133.9 kg (295 lb 3.2 oz)  Height:        Wt Readings from Last 3 Encounters:  11/24/16 133.9 kg (295 lb 3.2 oz)  10/26/16 132.9 kg (293 lb)  08/10/16 123.8 kg (273 lb)     Intake/Output Summary (Last 24 hours) at 11/24/16 1124 Last data filed at 11/24/16 0557   Gross per 24 hour  Intake              240 ml  Output             4000 ml  Net            -3760 ml     Physical Exam  Awake Alert, Oriented X 3, No new F.N deficits, Normal affect Supple Neck,+ JVD,  Symmetrical Chest wall movement, diminished air movement bilaterally, no wheezing Irreg,No Gallops,Rubs or new Murmurs, No Parasternal Heave +ve B.Sounds, Abd Soft, No tenderness, No rebound - guarding or rigidity. No Cyanosis, Clubbing , +2/3 edema, chronic lower extremity skin discoloration, as well as candidal rash and abdominal folds    Data Review:    CBC  Recent Labs Lab 11/21/16 1307 11/23/16 0435  WBC 6.9 5.3  HGB 11.5* 10.5*  HCT 34.9* 32.3*  PLT 138* 123*  MCV 96.1 97.6  MCH 31.7 31.7  MCHC 33.0 32.5  RDW 15.3 15.3  LYMPHSABS 1.2  --   MONOABS 0.6  --   EOSABS 0.2  --   BASOSABS 0.0  --     Chemistries   Recent Labs Lab 11/21/16 1307 11/22/16 0402 11/23/16 0435 11/24/16 0524  NA 139 140 140 139  K 4.4 3.9 3.6 3.8  CL 100* 101 96* 95*  CO2 29 31 33* 34*  GLUCOSE 122* 91 120* 92  BUN 40* 40* 42* 42*  CREATININE 1.93* 2.00* 1.96* 1.83*  CALCIUM 9.5 9.2 9.1 9.1  AST 33  --   --   --   ALT 20  --   --   --  ALKPHOS 65  --   --   --   BILITOT 0.8  --   --   --    ------------------------------------------------------------------------------------------------------------------ No results for input(s): CHOL, HDL, LDLCALC, TRIG, CHOLHDL, LDLDIRECT in the last 72 hours.  Lab Results  Component Value Date   HGBA1C (H) 12/06/2010    6.2 (NOTE)                                                                       According to the ADA Clinical Practice Recommendations for 2011, when HbA1c is used as a screening test:   >=6.5%   Diagnostic of Diabetes Mellitus           (if abnormal result  is confirmed)  5.7-6.4%   Increased risk of developing Diabetes Mellitus  References:Diagnosis and Classification of Diabetes Mellitus,Diabetes Care,2011,34(Suppl  1):S62-S69 and Standards of Medical Care in         Diabetes - 2011,Diabetes [id number],3662,94  (Suppl 1):S11-S61.   ------------------------------------------------------------------------------------------------------------------ No results for input(s): TSH, T4TOTAL, T3FREE, THYROIDAB in the last 72 hours.  Invalid input(s): FREET3 ------------------------------------------------------------------------------------------------------------------ No results for input(s): VITAMINB12, FOLATE, FERRITIN, TIBC, IRON, RETICCTPCT in the last 72 hours.  Coagulation profile No results for input(s): INR, PROTIME in the last 168 hours.  No results for input(s): DDIMER in the last 72 hours.  Cardiac Enzymes  Recent Labs Lab 11/21/16 1605 11/21/16 2132 11/22/16 0402  TROPONINI 0.03* 0.03* 0.03*   ------------------------------------------------------------------------------------------------------------------    Component Value Date/Time   BNP 133.0 (H) 11/21/2016 1307   BNP 122.0 (H) 04/13/2014 1405    Inpatient Medications  Scheduled Meds: . allopurinol  300 mg Oral QHS  . ammonium lactate   Topical Daily  . aspirin  325 mg Oral Daily  . bisacodyl  10 mg Oral Once  . bisacodyl  10 mg Rectal Once  . enoxaparin (LOVENOX) injection  60 mg Subcutaneous Q24H  . ferrous sulfate  325 mg Oral QPM  . fluconazole  100 mg Oral Daily  . furosemide  80 mg Intravenous BID  . hydrALAZINE  12.5 mg Oral BID  . HYDROcodone-acetaminophen  1 tablet Oral TID  . isosorbide mononitrate  30 mg Oral Daily  . nystatin   Topical TID  . pantoprazole  40 mg Oral Daily  . polyethylene glycol  34 g Oral BID  . potassium chloride SA  20 mEq Oral BID  . [START ON 11/25/2016] senna-docusate  2 tablet Oral BID  . vitamin B-12  500 mcg Oral Daily   Continuous Infusions: PRN Meds:.  Micro Results No results found for this or any previous visit (from the past 240 hour(s)).  Radiology Reports Dg Chest 2  View  Result Date: 11/21/2016 CLINICAL DATA:  Chest tightness. EXAM: CHEST  2 VIEW COMPARISON:  04/05/2016. FINDINGS: Cardiac pacer with lead tip over the right ventricle. Cardiomegaly with pulmonary vascular prominence and bilateral interstitial prominence with right side pleural effusion. Findings consistent with congestive heart failure. No pneumothorax. IMPRESSION: 1. Cardiac pacer with lead tip over the right ventricle. 2. Congestive heart failure with pulmonary venous congestion and right-sided pleural effusion . Electronically Signed   By: Marcello Moores  Register   On: 11/21/2016 13:52     [facility], Ashonti Leandro M.D on 11/24/2016  at 11:24 AM  Between 7am to 7pm - Pager - [phone]  After 7pm go to [email or web address] - password [facility] [place]  [facility] -  Office  [phone]

## 2016-11-24 NOTE — Progress Notes (Signed)
Patient refused wound care at this time, she states its too early, I'm sleepy, do it later please. Will inform day shift during shift report.

## 2016-11-24 NOTE — Progress Notes (Signed)
Progress Note  Patient Name: [name] [name] Date of Encounter: 11/24/2016  Primary Cardiologist: Dr. Dorris Carnes EP: Dr. Cristopher Peru  Subjective    Feeling some better. Breathing status remains difficult. Used CPAP last night.  Inpatient Medications    Scheduled Meds: . allopurinol  300 mg Oral QHS  . ammonium lactate   Topical Daily  . aspirin  325 mg Oral Daily  . enoxaparin (LOVENOX) injection  60 mg Subcutaneous Q24H  . ferrous sulfate  325 mg Oral QPM  . fluconazole  100 mg Oral Daily  . furosemide  80 mg Intravenous BID  . hydrALAZINE  12.5 mg Oral BID  . HYDROcodone-acetaminophen  1 tablet Oral TID  . isosorbide mononitrate  30 mg Oral Daily  . nystatin   Topical TID  . pantoprazole  40 mg Oral Daily  . potassium chloride SA  20 mEq Oral BID  . vitamin B-12  500 mcg Oral Daily    Vital Signs    Vitals:   11/23/16 0606 11/23/16 1609 11/23/16 2202 11/24/16 0556  BP: 140/62 108/74 123/67 140/62  Pulse: 99 99 95 94  Resp: 20 (!) 22 20 20   Temp: 99.3 F (37.4 C) 98.5 F (36.9 C) 98.1 F (36.7 C) 98.2 F (36.8 C)  TempSrc: Oral Oral Oral Oral  SpO2: 98% 100% 99% 100%  Weight: 300 lb (136.1 kg)   295 lb 3.2 oz (133.9 kg)  Height:        Intake/Output Summary (Last 24 hours) at 11/24/16 0910 Last data filed at 11/24/16 0557  Gross per 24 hour  Intake              240 ml  Output             4000 ml  Net            -3760 ml   Filed Weights   11/22/16 0622 11/23/16 0606 11/24/16 0556  Weight: (!) 304 lb (137.9 kg) 300 lb (136.1 kg) 295 lb 3.2 oz (133.9 kg)    Telemetry    Ventricular pacing with underlying atrial fibrillation, heart rate 90 to 100, burst NSVT. Personally reviewed.  Physical Exam   GEN: Morbidly obese, no acute distress.   Cardiac: Largely regular, soft systolic murmur, no gallop. Respiratory: Mild upper airway wheezes, wearing O2.  GI: Obese with pannus. Ventral hernia is prominent. MS: Edema with venous stasis skin changes, no  deformity. Neuro:  Nonfocal   Labs    Chemistry Recent Labs Lab 11/21/16 1307 11/22/16 0402 11/23/16 0435 11/24/16 0524  NA 139 140 140 139  K 4.4 3.9 3.6 3.8  CL 100* 101 96* 95*  CO2 29 31 33* 34*  GLUCOSE 122* 91 120* 92  BUN 40* 40* 42* 42*  CREATININE 1.93* 2.00* 1.96* 1.83*  CALCIUM 9.5 9.2 9.1 9.1  PROT 7.4  --   --   --   ALBUMIN 4.3  --   --   --   AST 33  --   --   --   ALT 20  --   --   --   ALKPHOS 65  --   --   --   BILITOT 0.8  --   --   --   GFRNONAA 25* 24* 25* 27*  GFRAA 29* 28* 28* 31*  ANIONGAP 10 8 11 10      Hematology Recent Labs Lab 11/21/16 1307 11/23/16 0435  WBC 6.9 5.3  RBC 3.63* 3.31*  HGB 11.5* 10.5*  HCT 34.9* 32.3*  MCV 96.1 97.6  MCH 31.7 31.7  MCHC 33.0 32.5  RDW 15.3 15.3  PLT 138* 123*    Cardiac Enzymes Recent Labs Lab 11/21/16 1605 11/21/16 2132 11/22/16 0402  TROPONINI 0.03* 0.03* 0.03*    Recent Labs Lab 11/21/16 1315  TROPIPOC 0.02     BNP Recent Labs Lab 11/21/16 1307  BNP 133.0*     Cardiac Studies   Echocardiogram 11/22/2016: Procedure narrative: Transthoracic echocardiography. Image   quality was adequate. The study was technically difficult, as a   result of body habitus. - Left ventricle: The cavity size was normal. Wall thickness was   increased in a pattern of mild LVH. The estimated ejection   fraction was 35%. There is hypokinesis of the   mid-apicalanteroseptal and apical myocardium. The study is not   technically sufficient to allow evaluation of LV diastolic   function. - Aortic valve: Trileaflet; moderately calcified leaflets. There   was mild stenosis. - Mitral valve: Calcified annulus. Mildly thickened leaflets .   There was mild regurgitation. - Left atrium: The atrium was severely dilated. - Right ventricle: Pacer wire or catheter noted in right ventricle. - Right atrium: The atrium was mildly dilated. Central venous   pressure (est): 15 mm Hg. - Atrial septum: No defect or  patent foramen ovale was identified. - Tricuspid valve: There was mild regurgitation. - Pulmonary arteries: PA peak pressure: 40 mm Hg (S). - Pericardium, extracardiac: A small pericardial effusion was   identified posterior to the heart. There was a right pleural   effusion.  Patient Profile     71 y.o. female with history of nonischemic cardiomyopathy, LVEF 35% at this pont, atrial fibrillation status post AV node ablation with pacemaker in place. She follows with Dr. Harrington Challenger and Dr. Lovena Le. She has developed fluid overload with combined heart failure symptoms over the last several weeks, 20-30 lb weight gain.   Assessment & Plan    1. Acute on Chronic Combined CHF: Most recent EF of 35% with nonischemic myopathy. She is diuresing well with IV BID lasix 80 mg, out more than in by 7 liters. Wt is down from admission weight 304 to 295 lbs. Some improvement in symptoms. Uncertain of dry wt. Creatinine improved this am. Continue IV diureses over weekend. Regimen also includes hydralazine and Imdur. She refuses beta blocker citing prior intolerances although details are not clear.  2. Chronic Atrial fibrillation: Refuses anticoagulation. On ASA. Remains at risk for stroke with increased thromboembolic risk score.  3. Essential hypertension: Continue follow trend, may be able to advance hydralazine somewhat further.  4. Chronic renal failure, stage 3: Creatinine improving with diureses, down to 1.8.  Signed, Jory Sims, NP  11/24/2016, 9:10 AM     Attending note:  Patient seen and examined. Discussed the case with Ms. Lawrence NP and modified above note. She continues on Lasix 80 mg IV twice daily with good diuresis, total of 7 L out more than in so far. Starting to have some improvement in symptoms, but still has a long way to go in terms of volume status (presented having gained between 20 and 30 pounds). Regimen includes hydralazine and nitrates. She has declined both anticoagulation for  atrial fibrillation stroke prophylaxis, also not willing to use beta blocker citing prior intolerances. Would continue IV diuresis through the weekend, continue to follow renal function. Her most recent creatinine has come down to 1.8 in the setting of diuresis. Once she  has stabilized and able to be converted back to an oral diuretic regimen, she may be a good candidate to arrange follow-up in the heart failure clinic and try to keep her volume status more optimized.  Satira Sark, M.D., F.A.C.C.

## 2016-11-25 LAB — BASIC METABOLIC PANEL
Anion gap: 11 (ref 5–15)
BUN: 44 mg/dL — AB (ref 6–20)
CHLORIDE: 94 mmol/L — AB (ref 101–111)
CO2: 35 mmol/L — AB (ref 22–32)
CREATININE: 1.83 mg/dL — AB (ref 0.44–1.00)
Calcium: 9.1 mg/dL (ref 8.9–10.3)
GFR calc Af Amer: 31 mL/min — ABNORMAL LOW (ref >60)
GFR calc non Af Amer: 27 mL/min — ABNORMAL LOW (ref >60)
Glucose, Bld: 92 mg/dL (ref 65–99)
Potassium: 3.9 mmol/L (ref 3.5–5.1)
SODIUM: 140 mmol/L (ref 135–145)

## 2016-11-25 LAB — MAGNESIUM: Magnesium: 2.2 mg/dL (ref 1.7–2.4)

## 2016-11-25 MED ORDER — POLYETHYLENE GLYCOL 3350 17 G PO PACK: 17.0000 g | PACK | Freq: Every day | ORAL | Status: DC

## 2016-11-25 MED ORDER — SORBITOL 70 % SOLN
960.0000 mL | TOPICAL_OIL | Freq: Once | ORAL | Status: DC
  Filled 2016-11-25: qty 240

## 2016-11-25 MED ORDER — BISACODYL 5 MG PO TBEC
10.0000 mg | DELAYED_RELEASE_TABLET | Freq: Once | ORAL | Status: AC
  Administered 2016-11-25: 10 mg via ORAL
  Filled 2016-11-25: qty 2

## 2016-11-25 MED ORDER — BISACODYL 10 MG RE SUPP
10.0000 mg | Freq: Once | RECTAL | Status: AC
  Administered 2016-11-25: 10 mg via RECTAL

## 2016-11-25 MED ORDER — POLYETHYLENE GLYCOL 3350 17 G PO PACK
34.0000 g | PACK | Freq: Two times a day (BID) | ORAL | Status: DC
  Administered 2016-11-25 (×2): 34 g via ORAL
  Filled 2016-11-25 (×2): qty 2

## 2016-11-25 NOTE — Progress Notes (Signed)
PROGRESS NOTE                                                                                                                                                                                                             Patient Demographics:    [name] [name], is a 71 y.o. female, DOB - 1945/10/04, WER:[id number]  Admit date - 11/21/2016   Admitting Physician Eber Jones, MD  Outpatient Primary MD for the patient is [name], MD  LOS - 4  Outpatient Specialists: Cardiology Dr Brynda Peon.  Chief Complaint  Patient presents with  . Shortness of Breath       Brief Narrative   71 y.o. 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   Subjective:    [name] [name] today has, No headache, No chest pain, No abdominal pain - No Nausea, Complains of constipation, no bowel movement yesterday despite multiple doses of miralax, reports dyspnea is improving  Assessment  & Plan :    Principal Problem:   Acute CHF (congestive heart failure) (HCC) Active Problems:   OSA (obstructive sleep apnea)   Essential hypertension   Atrial fibrillation (HCC)   Chronic combined systolic and diastolic CHF (congestive heart failure) (HCC)   Chronic renal failure   Liver cirrhosis secondary to NASH ([place])  Acute on chronic combined systolic/diastolic CHF -Most recent echo with EF 25-30% in 2016, repeat 2-D echo Showing improved EF to 35 %. There is hypokinesis of the mid-apicalanteroseptal and apical myocardium. - IV diuresis per cardiology, continue with IV Lasix 80 mg IV twice a day, - 8.7 L since admission , will need to continue with IV  diuresis over the weekend. - Patient has been refusing beta blockers in the past, NO  ARB/ACEI given renal failure., Continue with Imdur and hydralazine  History of atrial fibrillation -CHADSVASC Score of 4, Has refused anticoagulation , continue with aspirin 325 mg daily, status post AVN and pacemaker  Complete heart block - Status post permanent pacemaker  OSA - Continue Bipap QHS  CKD stage III - Stable on IV diuresis, continue to monitor .  Liver cirrhosis secondary to [facility] - LFTs within normal limits  Candidal skin infection - Continue with Nystatin powder, and  Diflucan, continue with local skin care, appears to be improving  Hypertension - Blood pressure is acceptable, continue with Imdur and hydralazine  Tinnitus - This appears to be chronic problem per patient, has been intermittent for a few years now, she was encouraged to see ENT as an outpatient.  Constipation - Continue with miralax, will try  Enema today  Code Status : Full  Family Communication  : none at bedside  Disposition Plan  : Will need another few days on IV diuresis, PT recommending SNF  Consults  :  Cardiology  Procedures  : none  DVT Prophylaxis  :  Lovenox  SCDs   Lab Results  Component Value Date   PLT 123 (L) 11/23/2016    Antibiotics  :    Anti-infectives    Start     Dose/Rate Route Frequency Ordered Stop   11/22/16 1300  fluconazole (DIFLUCAN) tablet 100 mg     100 mg Oral Daily 11/22/16 1223          Objective:   Vitals:   11/24/16 0556 11/24/16 1503 11/24/16 2046 11/25/16 0500  BP: 140/62 131/69 (!) 108/59 110/67  Pulse: 94 99 100 63  Resp: 20 18 20 20   Temp: 98.2 F (36.8 C) 97.8 F (36.6 C) 98.1 F (36.7 C) 98 F (36.7 C)  TempSrc: Oral Oral Oral Oral  SpO2: 100% 100% 96% 96%  Weight: 133.9 kg (295 lb 3.2 oz)   133.9 kg (295 lb 3.2 oz)  Height:        Wt Readings from Last 3 Encounters:  11/25/16 133.9 kg (295 lb 3.2 oz)  10/26/16 132.9 kg (293 lb)    08/10/16 123.8 kg (273 lb)     Intake/Output Summary (Last 24 hours) at 11/25/16 1015 Last data filed at 11/25/16 0900  Gross per 24 hour  Intake              720 ml  Output             2175 ml  Net            -1455 ml     Physical Exam  Awake Alert, Oriented X 3, No new F.N deficits, Normal affect Supple Neck,+ JVD,  Symmetrical Chest wall movement, diminished air movement bilaterally, no wheezing Irreg,No Gallops,Rubs or new Murmurs, No Parasternal Heave +ve B.Sounds, Abd Soft, No tenderness, No rebound - guarding or rigidity. No Cyanosis, Clubbing , +2/3 edema, chronic lower extremity skin discoloration, as well as candidal rash and abdominal folds    Data Review:    CBC  Recent Labs Lab 11/21/16 1307 11/23/16 0435  WBC 6.9 5.3  HGB 11.5* 10.5*  HCT 34.9* 32.3*  PLT 138* 123*  MCV 96.1 97.6  MCH 31.7 31.7  MCHC 33.0 32.5  RDW 15.3 15.3  LYMPHSABS 1.2  --   MONOABS 0.6  --   EOSABS 0.2  --   BASOSABS 0.0  --     Chemistries   Recent Labs Lab 11/21/16 1307 11/22/16 0402 11/23/16 0435 11/24/16 0524 11/25/16 0637  NA 139 140 140 139 140  K 4.4 3.9 3.6 3.8 3.9  CL 100* 101 96* 95* 94*  CO2 29 31 33* 34* 35*  GLUCOSE 122* 91 120* 92 92  BUN 40* 40* 42* 42* 44*  CREATININE 1.93* 2.00* 1.96* 1.83* 1.83*  CALCIUM 9.5 9.2  9.1 9.1 9.1  MG  --   --   --   --  2.2  AST 33  --   --   --   --   ALT 20  --   --   --   --   ALKPHOS 65  --   --   --   --   BILITOT 0.8  --   --   --   --    ------------------------------------------------------------------------------------------------------------------ No results for input(s): CHOL, HDL, LDLCALC, TRIG, CHOLHDL, LDLDIRECT in the last 72 hours.  Lab Results  Component Value Date   HGBA1C (H) 12/06/2010    6.2 (NOTE)                                                                       According to the ADA Clinical Practice Recommendations for 2011, when HbA1c is used as a screening test:   >=6.5%    Diagnostic of Diabetes Mellitus           (if abnormal result  is confirmed)  5.7-6.4%   Increased risk of developing Diabetes Mellitus  References:Diagnosis and Classification of Diabetes Mellitus,Diabetes Care,2011,34(Suppl 1):S62-S69 and Standards of Medical Care in         Diabetes - 2011,Diabetes [id number],3710,62  (Suppl 1):S11-S61.   ------------------------------------------------------------------------------------------------------------------ No results for input(s): TSH, T4TOTAL, T3FREE, THYROIDAB in the last 72 hours.  Invalid input(s): FREET3 ------------------------------------------------------------------------------------------------------------------ No results for input(s): VITAMINB12, FOLATE, FERRITIN, TIBC, IRON, RETICCTPCT in the last 72 hours.  Coagulation profile No results for input(s): INR, PROTIME in the last 168 hours.  No results for input(s): DDIMER in the last 72 hours.  Cardiac Enzymes  Recent Labs Lab 11/21/16 1605 11/21/16 2132 11/22/16 0402  TROPONINI 0.03* 0.03* 0.03*   ------------------------------------------------------------------------------------------------------------------    Component Value Date/Time   BNP 133.0 (H) 11/21/2016 1307   BNP 122.0 (H) 04/13/2014 1405    Inpatient Medications  Scheduled Meds: . allopurinol  300 mg Oral QHS  . ammonium lactate   Topical Daily  . aspirin  325 mg Oral Daily  . bisacodyl  10 mg Oral Once  . bisacodyl  10 mg Rectal Once  . enoxaparin (LOVENOX) injection  60 mg Subcutaneous Q24H  . ferrous sulfate  325 mg Oral QPM  . fluconazole  100 mg Oral Daily  . furosemide  80 mg Intravenous BID  . hydrALAZINE  12.5 mg Oral BID  . HYDROcodone-acetaminophen  1 tablet Oral TID  . isosorbide mononitrate  30 mg Oral Daily  . nystatin   Topical TID  . pantoprazole  40 mg Oral Daily  . polyethylene glycol  17 g Oral Daily  . polyethylene glycol  34 g Oral BID  . potassium chloride SA  20 mEq Oral BID    . senna-docusate  2 tablet Oral BID  . sorbitol, milk of mag, mineral oil, glycerin (SMOG) enema  960 mL Rectal Once  . vitamin B-12  500 mcg Oral Daily   Continuous Infusions: PRN Meds:.  Micro Results No results found for this or any previous visit (from the past 240 hour(s)).  Radiology Reports Dg Chest 2 View  Result Date: 11/21/2016 CLINICAL DATA:  Chest tightness. EXAM: CHEST  2 VIEW COMPARISON:  04/05/2016.  FINDINGS: Cardiac pacer with lead tip over the right ventricle. Cardiomegaly with pulmonary vascular prominence and bilateral interstitial prominence with right side pleural effusion. Findings consistent with congestive heart failure. No pneumothorax. IMPRESSION: 1. Cardiac pacer with lead tip over the right ventricle. 2. Congestive heart failure with pulmonary venous congestion and right-sided pleural effusion . Electronically Signed   By: Marcello Moores  Register   On: 11/21/2016 13:52     [facility], [name] [name] M.D on 11/25/2016 at 10:15 AM  Between 7am to 7pm - Pager - [phone]  After 7pm go to [email or web address] - password [facility]  [facility] -  Office  [phone]

## 2016-11-26 LAB — BASIC METABOLIC PANEL
Anion gap: 9 (ref 5–15)
BUN: 47 mg/dL — ABNORMAL HIGH (ref 6–20)
CO2: 36 mmol/L — AB (ref 22–32)
CREATININE: 1.89 mg/dL — AB (ref 0.44–1.00)
Calcium: 9.1 mg/dL (ref 8.9–10.3)
Chloride: 95 mmol/L — ABNORMAL LOW (ref 101–111)
GFR calc non Af Amer: 26 mL/min — ABNORMAL LOW (ref >60)
GFR, EST AFRICAN AMERICAN: 30 mL/min — AB (ref >60)
Glucose, Bld: 85 mg/dL (ref 65–99)
Potassium: 3.8 mmol/L (ref 3.5–5.1)
Sodium: 140 mmol/L (ref 135–145)

## 2016-11-26 MED ORDER — NYSTATIN 100000 UNIT/GM EX CREA
TOPICAL_CREAM | Freq: Three times a day (TID) | CUTANEOUS | Status: DC
  Administered 2016-11-26 – 2016-12-03 (×21): via TOPICAL
  Filled 2016-11-26: qty 15

## 2016-11-26 MED ORDER — METOLAZONE 5 MG PO TABS
5.0000 mg | ORAL_TABLET | Freq: Once | ORAL | Status: AC
  Administered 2016-11-26: 5 mg via ORAL
  Filled 2016-11-26: qty 1

## 2016-11-26 NOTE — Progress Notes (Signed)
PROGRESS NOTE                                                                                                                                                                                                             Patient Demographics:    [name] [name], is a 71 y.o. female, DOB - April 27, 1946, PPJ:[id number]  Admit date - 11/21/2016   Admitting Physician Eber Jones, MD  Outpatient Primary MD for the patient is [name], MD  LOS - 5  Outpatient Specialists: Cardiology Dr Brynda Peon.  Chief Complaint  Patient presents with  . Shortness of Breath       Brief Narrative   71 y.o. 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   Subjective:    [name] [name] today has, No headache, No chest pain, No abdominal pain - No Nausea, Complains of constipation, no bowel movement yesterday despite multiple doses of miralax, reports dyspnea is improving  Assessment  & Plan :    Principal Problem:   Acute CHF (congestive heart failure) (HCC) Active Problems:   OSA (obstructive sleep apnea)   Essential hypertension   Atrial fibrillation (HCC)   Chronic combined systolic and diastolic CHF (congestive heart failure) (HCC)   Chronic renal failure   Liver cirrhosis secondary to NASH ([place])  Acute on chronic combined systolic/diastolic CHF -Most recent echo with EF 25-30% in 2016, repeat 2-D echo Showing improved EF to 35 %. There is hypokinesis of the mid-apicalanteroseptal and apical myocardium. - IV diuresis per cardiology, continue with IV Lasix 80 mg IV twice a day, - 11.4 L since admission , will need to continue with IV  diuresis , Will give one dose of metolazone today. - Patient has been refusing beta blockers in the past, NO  ARB/ACEI given renal failure., Continue with Imdur and hydralazine  History of atrial fibrillation -CHADSVASC Score of 4, Has refused anticoagulation , continue with aspirin 325 mg daily, status post AVN and pacemaker  Complete heart block - Status post permanent pacemaker  OSA - Continue Bipap QHS  CKD stage III - Stable on  IV diuresis, continue to monitor .  Liver cirrhosis secondary to [facility] - LFTs within normal limits  Candidal skin infection - Continue with Nystatin powder, and  Diflucan, continue with local skin care, appears to be improving, will start on nystatin cream for left gluteal fold section.  Hypertension - Blood pressure is acceptable, continue with Imdur and hydralazine  Tinnitus - This appears to be chronic problem per patient, has been intermittent for a few years now, she was encouraged to see ENT as an outpatient.  Constipation - Resolved with laxatives  Code Status : Full  Family Communication  : none at bedside  Disposition Plan  : Will need another few days on IV diuresis, PT recommending SNF  Consults  :  Cardiology  Procedures  : none  DVT Prophylaxis  :  Lovenox    Lab Results  Component Value Date   PLT 123 (L) 11/23/2016    Antibiotics  :    Anti-infectives    Start     Dose/Rate Route Frequency Ordered Stop   11/22/16 1300  fluconazole (DIFLUCAN) tablet 100 mg     100 mg Oral Daily 11/22/16 1223          Objective:   Vitals:   11/25/16 0500 11/25/16 1444 11/25/16 2042 11/26/16 0500  BP: 110/67 115/69 126/78 (!) 115/52  Pulse: 63 93 88 86  Resp: 20 16 20 20   Temp: 98 F (36.7 C) 97.9 F (36.6 C) 98.2 F (36.8 C) 98.8 F (37.1 C)  TempSrc: Oral Oral Oral Oral  SpO2: 96% 100% 94% 94%  Weight: 133.9 kg (295 lb 3.2 oz)   133.9 kg (295 lb 3.1 oz)  Height:        Wt Readings from Last 3 Encounters:  11/26/16  133.9 kg (295 lb 3.1 oz)  10/26/16 132.9 kg (293 lb)  08/10/16 123.8 kg (273 lb)     Intake/Output Summary (Last 24 hours) at 11/26/16 0920 Last data filed at 11/26/16 0900  Gross per 24 hour  Intake              720 ml  Output             3400 ml  Net            -2680 ml     Physical Exam  Awake Alert, Oriented X 3, No new F.N deficits, Normal affect Supple Neck,+ JVD,  Symmetrical Chest wall movement, diminished air movement bilaterally, no wheezing Irreg,No Gallops,Rubs or new Murmurs, No Parasternal Heave +ve B.Sounds, Abd Soft, No tenderness, No rebound - guarding or rigidity. No Cyanosis, Clubbing , +2 edema, chronic lower extremity skin discoloration, as well as candidal rash and abdominal folds ( improving)and left gluteal fold.    Data Review:    CBC  Recent Labs Lab 11/21/16 1307 11/23/16 0435  WBC 6.9 5.3  HGB 11.5* 10.5*  HCT 34.9* 32.3*  PLT 138* 123*  MCV 96.1 97.6  MCH 31.7 31.7  MCHC 33.0 32.5  RDW 15.3 15.3  LYMPHSABS 1.2  --   MONOABS 0.6  --   EOSABS 0.2  --   BASOSABS 0.0  --     Chemistries   Recent Labs Lab 11/21/16 1307 11/22/16 0402 11/23/16 0435 11/24/16 0524 11/25/16 0637 11/26/16 0556  NA 139 140 140 139 140 140  K 4.4 3.9 3.6 3.8 3.9 3.8  CL 100* 101 96* 95* 94* 95*  CO2 29 31 33* 34* 35* 36*  GLUCOSE 122* 91  120* 92 92 85  BUN 40* 40* 42* 42* 44* 47*  CREATININE 1.93* 2.00* 1.96* 1.83* 1.83* 1.89*  CALCIUM 9.5 9.2 9.1 9.1 9.1 9.1  MG  --   --   --   --  2.2  --   AST 33  --   --   --   --   --   ALT 20  --   --   --   --   --   ALKPHOS 65  --   --   --   --   --   BILITOT 0.8  --   --   --   --   --    ------------------------------------------------------------------------------------------------------------------ No results for input(s): CHOL, HDL, LDLCALC, TRIG, CHOLHDL, LDLDIRECT in the last 72 hours.  Lab Results  Component Value Date   HGBA1C (H) 12/06/2010    6.2 (NOTE)                                                                        According to the ADA Clinical Practice Recommendations for 2011, when HbA1c is used as a screening test:   >=6.5%   Diagnostic of Diabetes Mellitus           (if abnormal result  is confirmed)  5.7-6.4%   Increased risk of developing Diabetes Mellitus  References:Diagnosis and Classification of Diabetes Mellitus,Diabetes Care,2011,34(Suppl 1):S62-S69 and Standards of Medical Care in         Diabetes - 2011,Diabetes ZLDJ,5701,77  (Suppl 1):S11-S61.   ------------------------------------------------------------------------------------------------------------------ No results for input(s): TSH, T4TOTAL, T3FREE, THYROIDAB in the last 72 hours.  Invalid input(s): FREET3 ------------------------------------------------------------------------------------------------------------------ No results for input(s): VITAMINB12, FOLATE, FERRITIN, TIBC, IRON, RETICCTPCT in the last 72 hours.  Coagulation profile No results for input(s): INR, PROTIME in the last 168 hours.  No results for input(s): DDIMER in the last 72 hours.  Cardiac Enzymes  Recent Labs Lab 11/21/16 1605 11/21/16 2132 11/22/16 0402  TROPONINI 0.03* 0.03* 0.03*   ------------------------------------------------------------------------------------------------------------------    Component Value Date/Time   BNP 133.0 (H) 11/21/2016 1307   BNP 122.0 (H) 04/13/2014 1405    Inpatient Medications  Scheduled Meds: . allopurinol  300 mg Oral QHS  . ammonium lactate   Topical Daily  . aspirin  325 mg Oral Daily  . bisacodyl  10 mg Rectal Once  . enoxaparin (LOVENOX) injection  60 mg Subcutaneous Q24H  . ferrous sulfate  325 mg Oral QPM  . fluconazole  100 mg Oral Daily  . furosemide  80 mg Intravenous BID  . hydrALAZINE  12.5 mg Oral BID  . HYDROcodone-acetaminophen  1 tablet Oral TID  . isosorbide mononitrate  30 mg Oral Daily  . metolazone  5 mg Oral Once  . nystatin   Topical TID  .  nystatin cream   Topical TID  . pantoprazole  40 mg Oral Daily  . potassium chloride SA  20 mEq Oral BID  . senna-docusate  2 tablet Oral BID  . vitamin B-12  500 mcg Oral Daily   Continuous Infusions: PRN Meds:.  Micro Results No results found for this or any previous visit (from the past 240 hour(s)).  Radiology Reports Dg Chest 2 View  Result Date: 11/21/2016  CLINICAL DATA:  Chest tightness. EXAM: CHEST  2 VIEW COMPARISON:  04/05/2016. FINDINGS: Cardiac pacer with lead tip over the right ventricle. Cardiomegaly with pulmonary vascular prominence and bilateral interstitial prominence with right side pleural effusion. Findings consistent with congestive heart failure. No pneumothorax. IMPRESSION: 1. Cardiac pacer with lead tip over the right ventricle. 2. Congestive heart failure with pulmonary venous congestion and right-sided pleural effusion . Electronically Signed   By: Marcello Moores  Register   On: 11/21/2016 13:52     [facility], Bracha Frankowski M.D on 11/26/2016 at 9:20 AM  Between 7am to 7pm - Pager - [phone]  After 7pm go to [email or web address] - password [facility]  [facility] -  Office  [phone]

## 2016-11-27 ENCOUNTER — Inpatient Hospital Stay (HOSPITAL_COMMUNITY): Payer: Medicare Other

## 2016-11-27 LAB — BASIC METABOLIC PANEL
Anion gap: 9 (ref 5–15)
BUN: 49 mg/dL — ABNORMAL HIGH (ref 6–20)
CALCIUM: 9.1 mg/dL (ref 8.9–10.3)
CO2: 36 mmol/L — ABNORMAL HIGH (ref 22–32)
CREATININE: 2.02 mg/dL — AB (ref 0.44–1.00)
Chloride: 94 mmol/L — ABNORMAL LOW (ref 101–111)
GFR calc Af Amer: 27 mL/min — ABNORMAL LOW (ref >60)
GFR, EST NON AFRICAN AMERICAN: 24 mL/min — AB (ref >60)
Glucose, Bld: 82 mg/dL (ref 65–99)
Potassium: 4 mmol/L (ref 3.5–5.1)
SODIUM: 139 mmol/L (ref 135–145)

## 2016-11-27 NOTE — Progress Notes (Addendum)
PROGRESS NOTE                                                                                                                                                                                                             Patient Demographics:    [name] [name], is a 71 y.o. female, DOB - [birth year], QHU:[id number]  Admit date - 11/21/2016   Admitting Physician Eber Jones, MD  Outpatient Primary MD for the patient is [name], MD  LOS - 6  Outpatient Specialists: Cardiology Dr Brynda Peon.  Chief Complaint  Patient presents with  . Shortness of Breath       Brief Narrative   71 y.o. 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   Subjective:    [name] [name] today has, No headache, No chest pain,  No Nausea, No further constipation , and planes of right shoulder pain, as well as left lower quadrant pain.  Assessment  & Plan :    Principal Problem:   Acute CHF (congestive heart failure) (HCC) Active Problems:   OSA (obstructive sleep apnea)   Essential hypertension   Atrial fibrillation (HCC)   Chronic combined systolic and diastolic CHF (congestive heart failure) (HCC)   Chronic renal failure   Liver cirrhosis secondary to NASH ([place])  Acute on chronic combined systolic/diastolic CHF -Most recent echo with EF 25-30% in 2016, repeat 2-D echo Showing improved EF to 35 %. There is hypokinesis of the mid-apicalanteroseptal and apical myocardium. - IV diuresis per cardiology, continue with IV Lasix 80 mg IV twice a day, - 11.8 L since admission(But only 3 80 mL over last 24 hours), still significantly volume overload  as per cardiology, continue with IV Lasix at current dose. - Patient has been refusing beta blockers in the past, NO  ARB/ACEI given renal failure., Continue with Imdur and hydralazine  History of atrial fibrillation -CHADSVASC Score of 4, Has refused anticoagulation , continue with aspirin 325 mg daily, status post AVN and pacemaker  Complete heart block - Status post permanent pacemaker  OSA - Continue Bipap QHS  CKD stage  III - Stable on IV diuresis, continue to monitor .  Liver cirrhosis secondary to [facility] - LFTs within normal limits  Candidal skin infection - Continue with Nystatin powder, and  Diflucan, continue with local skin care, appears to be improving, will start on nystatin cream for left gluteal fold section.  Hypertension - Blood pressure is acceptable, continue with Imdur and hydralazine  Tinnitus - This appears to be chronic problem per patient, has been intermittent for a few years now, she was encouraged to see ENT as an outpatient.  Constipation - Resolved with laxatives  Will obtain abdominal x-ray, right shoulder x-ray to evaluate for shoulder and abdominal pain  Code Status : Full  Family Communication  : none at bedside  Disposition Plan  :  PT recommending SNF, not ready for discharge yet, as she still needing IV diuresis.  Consults  :  Cardiology  Procedures  : none  DVT Prophylaxis  :  Lovenox    Lab Results  Component Value Date   PLT 123 (L) 11/23/2016    Antibiotics  :    Anti-infectives    Start     Dose/Rate Route Frequency Ordered Stop   11/22/16 1300  fluconazole (DIFLUCAN) tablet 100 mg     100 mg Oral Daily 11/22/16 1223          Objective:   Vitals:   11/26/16 0500 11/26/16 1438 11/26/16 2115 11/27/16 0551  BP: (!) 115/52 (!) 116/50 (!) 106/54 (!) 106/51  Pulse: 86 97 89 90  Resp: 20 18 18 18   Temp: 98.8 F (37.1 C) 98.2 F (36.8 C) 98.1 F (36.7 C) 98.3 F (36.8 C)  TempSrc: Oral Oral Oral Oral  SpO2: 94% 95%  94% 94%  Weight: 133.9 kg (295 lb 3.1 oz)   127 kg (280 lb)  Height:        Wt Readings from Last 3 Encounters:  11/27/16 127 kg (280 lb)  10/26/16 132.9 kg (293 lb)  08/10/16 123.8 kg (273 lb)     Intake/Output Summary (Last 24 hours) at 11/27/16 1027 Last data filed at 11/27/16 0600  Gross per 24 hour  Intake              480 ml  Output             1100 ml  Net             -620 ml     Physical Exam  Awake Alert, Oriented X 3, No new F.N deficits, Normal affect Supple Neck,+ JVD,  Symmetrical Chest wall movement, diminished air movement bilaterally, no wheezing Irreg,No Gallops,Rubs or new Murmurs, No Parasternal Heave +ve B.Sounds, Abd Soft, No tenderness, No rebound - guarding or rigidity. No Cyanosis, Clubbing , +2 edema, chronic lower extremity skin discoloration, as well as candidal rash and abdominal folds ( improving)and left gluteal fold.    Data Review:    CBC  Recent Labs Lab 11/21/16 1307 11/23/16 0435  WBC 6.9 5.3  HGB 11.5* 10.5*  HCT 34.9* 32.3*  PLT 138* 123*  MCV 96.1 97.6  MCH 31.7 31.7  MCHC 33.0 32.5  RDW 15.3 15.3  LYMPHSABS 1.2  --   MONOABS 0.6  --   EOSABS 0.2  --   BASOSABS 0.0  --     Chemistries   Recent Labs Lab 11/21/16 1307  11/23/16 0435 11/24/16 0524 11/25/16 0637 11/26/16 0556 11/27/16 0502  NA 139  < > 140 139 140 140 139  K 4.4  < > 3.6 3.8 3.9 3.8 4.0  CL 100*  < > 96* 95* 94* 95* 94*  CO2 29  < > 33* 34* 35* 36* 36*  GLUCOSE 122*  < > 120* 92 92 85 82  BUN 40*  < > 42* 42* 44* 47* 49*  CREATININE 1.93*  < > 1.96* 1.83* 1.83* 1.89* 2.02*  CALCIUM 9.5  < > 9.1 9.1 9.1 9.1 9.1  MG  --   --   --   --  2.2  --   --   AST 33  --   --   --   --   --   --   ALT 20  --   --   --   --   --   --   ALKPHOS 65  --   --   --   --   --   --   BILITOT 0.8  --   --   --   --   --   --   < > = values in this interval not  displayed. ------------------------------------------------------------------------------------------------------------------ No results for input(s): CHOL, HDL, LDLCALC, TRIG, CHOLHDL, LDLDIRECT in the last 72 hours.  Lab Results  Component Value Date   HGBA1C (H) 12/06/2010    6.2 (NOTE)                                                                       According to the ADA Clinical Practice Recommendations for 2011, when HbA1c is used as a screening test:   >=6.5%   Diagnostic of Diabetes Mellitus           (if abnormal result  is confirmed)  5.7-6.4%   Increased risk of developing Diabetes Mellitus  References:Diagnosis and Classification of Diabetes Mellitus,Diabetes Care,2011,34(Suppl 1):S62-S69 and Standards of Medical Care in         Diabetes - 2011,Diabetes WUXL,2440,10  (Suppl 1):S11-S61.   ------------------------------------------------------------------------------------------------------------------ No results for input(s): TSH, T4TOTAL, T3FREE, THYROIDAB in the last 72 hours.  Invalid input(s): FREET3 ------------------------------------------------------------------------------------------------------------------ No results for input(s): VITAMINB12, FOLATE, FERRITIN, TIBC, IRON, RETICCTPCT in the last 72 hours.  Coagulation profile No results for input(s): INR, PROTIME in the last 168 hours.  No results for input(s): DDIMER in the last 72 hours.  Cardiac Enzymes  Recent Labs Lab 11/21/16 1605 11/21/16 2132 11/22/16 0402  TROPONINI 0.03* 0.03* 0.03*   ------------------------------------------------------------------------------------------------------------------    Component Value Date/Time   BNP 133.0 (H) 11/21/2016 1307   BNP 122.0 (H) 04/13/2014 1405    Inpatient Medications  Scheduled Meds: . allopurinol  300 mg Oral QHS  . ammonium lactate   Topical Daily  . aspirin  325 mg Oral Daily  . bisacodyl  10 mg Rectal Once  . enoxaparin (LOVENOX)  injection  60 mg Subcutaneous Q24H  . ferrous sulfate  325 mg Oral QPM  . fluconazole  100 mg Oral Daily  . furosemide  80 mg Intravenous BID  . hydrALAZINE  12.5 mg Oral BID  . HYDROcodone-acetaminophen  1 tablet Oral TID  . isosorbide mononitrate  30 mg Oral Daily  . nystatin   Topical TID  . nystatin cream   Topical TID  . pantoprazole  40 mg  Oral Daily  . potassium chloride SA  20 mEq Oral BID  . senna-docusate  2 tablet Oral BID  . vitamin B-12  500 mcg Oral Daily   Continuous Infusions: PRN Meds:.  Micro Results No results found for this or any previous visit (from the past 240 hour(s)).  Radiology Reports Dg Chest 2 View  Result Date: 11/21/2016 CLINICAL DATA:  Chest tightness. EXAM: CHEST  2 VIEW COMPARISON:  04/05/2016. FINDINGS: Cardiac pacer with lead tip over the right ventricle. Cardiomegaly with pulmonary vascular prominence and bilateral interstitial prominence with right side pleural effusion. Findings consistent with congestive heart failure. No pneumothorax. IMPRESSION: 1. Cardiac pacer with lead tip over the right ventricle. 2. Congestive heart failure with pulmonary venous congestion and right-sided pleural effusion . Electronically Signed   By: Marcello Moores  Register   On: 11/21/2016 13:52     [facility], Feliciano Wynter M.D on 11/27/2016 at 10:27 AM  Between 7am to 7pm - Pager - [phone]  After 7pm go to [email or web address] - password [facility]  [facility] -  Office  [phone]

## 2016-11-27 NOTE — Progress Notes (Signed)
Subjective:    SOB is improving, still not at baseline.   Objective:   Temp:  [98.1 F (36.7 C)-98.3 F (36.8 C)] 98.3 F (36.8 C) (03/05 0551) Pulse Rate:  [89-97] 90 (03/05 0551) Resp:  [18] 18 (03/05 0551) BP: (106-116)/(50-54) 106/51 (03/05 0551) SpO2:  [94 %-95 %] 94 % (03/05 0551) Weight:  [280 lb (127 kg)] 280 lb (127 kg) (03/05 0551) Last BM Date: 11/20/16  Filed Weights   11/25/16 0500 11/26/16 0500 11/27/16 0551  Weight: 295 lb 3.2 oz (133.9 kg) 295 lb 3.1 oz (133.9 kg) 280 lb (127 kg)    Intake/Output Summary (Last 24 hours) at 11/27/16 0917 Last data filed at 11/27/16 0600  Gross per 24 hour  Intake              480 ml  Output             1100 ml  Net             -620 ml    Telemetry: N/A  Exam:  General: NAD  HEENT: sclera clear, throat clear  Resp: crackles bilateral bases  Cardiac: irreg, 3/6 systolic murmur at apex, elevated JVD  GI: abdomen soft, NT, ND  MSK: 1+bilateral LE edema  Neuro: no focal deficits  Psych: appropriate affect  Lab Results:  Basic Metabolic Panel:  Recent Labs Lab 11/25/16 0637 11/26/16 0556 11/27/16 0502  NA 140 140 139  K 3.9 3.8 4.0  CL 94* 95* 94*  CO2 35* 36* 36*  GLUCOSE 92 85 82  BUN 44* 47* 49*  CREATININE 1.83* 1.89* 2.02*  CALCIUM 9.1 9.1 9.1  MG 2.2  --   --     Liver Function Tests:  Recent Labs Lab 11/21/16 1307  AST 33  ALT 20  ALKPHOS 65  BILITOT 0.8  PROT 7.4  ALBUMIN 4.3    CBC:  Recent Labs Lab 11/21/16 1307 11/23/16 0435  WBC 6.9 5.3  HGB 11.5* 10.5*  HCT 34.9* 32.3*  MCV 96.1 97.6  PLT 138* 123*    Cardiac Enzymes:  Recent Labs Lab 11/21/16 1605 11/21/16 2132 11/22/16 0402  TROPONINI 0.03* 0.03* 0.03*    BNP: No results for input(s): PROBNP in the last 8760 hours.  Coagulation: No results for input(s): INR in the last 168 hours.  ECG:   Medications:   Scheduled Medications: . allopurinol  300 mg Oral QHS  . ammonium lactate   Topical  Daily  . aspirin  325 mg Oral Daily  . bisacodyl  10 mg Rectal Once  . enoxaparin (LOVENOX) injection  60 mg Subcutaneous Q24H  . ferrous sulfate  325 mg Oral QPM  . fluconazole  100 mg Oral Daily  . furosemide  80 mg Intravenous BID  . hydrALAZINE  12.5 mg Oral BID  . HYDROcodone-acetaminophen  1 tablet Oral TID  . isosorbide mononitrate  30 mg Oral Daily  . nystatin   Topical TID  . nystatin cream   Topical TID  . pantoprazole  40 mg Oral Daily  . potassium chloride SA  20 mEq Oral BID  . senna-docusate  2 tablet Oral BID  . vitamin B-12  500 mcg Oral Daily     Infusions:   PRN Medications:       Assessment/Plan    1. Acute on chronic combined systolic/diastolic HF - echo 8/56/31 LVEF 35%, cannot eval diastoilc function, mild AS, mild MR,  - negative 1.1 liters yesterday though I/Os  are incomplete, total negative around 12 liters since admission. She is on lasix IV 60m bid. She gas received one 583mdose of metolazone yesterday. Uptrend in Cr though within her previous historical range - medical therapy with hydralazine 12.64m4mid, imdur 30. No ACE/ARB/aldactone due to poor renal function. Patient has refused beta blocker due to reported intolerance.  - still volume up, continue IV lasix today. Follow renal function closely.   2. Chronic afib - refuses anticoagulation, remains on ASA - has not required av nodal agent  3. CKD III-IV      JonCarlyle Dolly.D.,

## 2016-11-28 ENCOUNTER — Inpatient Hospital Stay (HOSPITAL_COMMUNITY): Payer: Medicare Other

## 2016-11-28 DIAGNOSIS — I5023 Acute on chronic systolic (congestive) heart failure: Secondary | ICD-10-CM

## 2016-11-28 LAB — BASIC METABOLIC PANEL
ANION GAP: 11 (ref 5–15)
BUN: 53 mg/dL — AB (ref 6–20)
CALCIUM: 9.3 mg/dL (ref 8.9–10.3)
CO2: 34 mmol/L — AB (ref 22–32)
Chloride: 94 mmol/L — ABNORMAL LOW (ref 101–111)
Creatinine, Ser: 2.04 mg/dL — ABNORMAL HIGH (ref 0.44–1.00)
GFR calc Af Amer: 27 mL/min — ABNORMAL LOW (ref >60)
GFR, EST NON AFRICAN AMERICAN: 23 mL/min — AB (ref >60)
GLUCOSE: 84 mg/dL (ref 65–99)
Potassium: 4.2 mmol/L (ref 3.5–5.1)
Sodium: 139 mmol/L (ref 135–145)

## 2016-11-28 LAB — CBC
HEMATOCRIT: 32.5 % — AB (ref 36.0–46.0)
HEMOGLOBIN: 10.6 g/dL — AB (ref 12.0–15.0)
MCH: 31.4 pg (ref 26.0–34.0)
MCHC: 32.6 g/dL (ref 30.0–36.0)
MCV: 96.2 fL (ref 78.0–100.0)
Platelets: 125 10*3/uL — ABNORMAL LOW (ref 150–400)
RBC: 3.38 MIL/uL — ABNORMAL LOW (ref 3.87–5.11)
RDW: 15 % (ref 11.5–15.5)
WBC: 4 10*3/uL (ref 4.0–10.5)

## 2016-11-28 MED ORDER — POLYVINYL ALCOHOL 1.4 % OP SOLN
1.0000 [drp] | OPHTHALMIC | Status: DC | PRN
  Administered 2016-11-28 – 2016-12-03 (×5): 1 [drp] via OPHTHALMIC
  Filled 2016-11-28: qty 15

## 2016-11-28 MED ORDER — SIMETHICONE 80 MG PO CHEW
160.0000 mg | CHEWABLE_TABLET | Freq: Four times a day (QID) | ORAL | Status: DC | PRN
  Filled 2016-11-28: qty 2

## 2016-11-28 NOTE — Progress Notes (Signed)
OT Cancellation Note  Patient Details Name: [name] [name] MRN: [id number] DOB: 07-14-1946   Cancelled Treatment:    Reason Eval/Treat Not Completed: Other (comment).  Patient sitting up in recliner eating breakfast upon therapy arrival. At the time of therapist entering the room, patient was receiving video phone call and requested to take phone call. Pt was briefly presented with handout regarding Energy Conservation techniques and therapist encouraged patient to review handout independently. Therapist will follow up with patient at a later time to review handout.     Ailene Ravel, OTR/L,CBIS  [phone]  11/28/2016, 8:52 AM

## 2016-11-28 NOTE — Progress Notes (Signed)
PROGRESS NOTE                                                                                                                                                                                                             Patient Demographics:    [name] [name], is a 71 y.o. female, DOB - [birth year], VFI:[id number]  Admit date - 11/21/2016   Admitting Physician [name] Jones, MD  Outpatient Primary MD for the patient is [name] Amel, MD  LOS - 7  Outpatient Specialists: Cardiology Dr [name] [name].  Chief Complaint  Patient presents with  . Shortness of Breath       Brief Narrative   71 y.o. 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   Subjective:    [name] [name] today has, No headache, No chest pain,  No Nausea, No further constipation , And eyes any abdominal pain today, port ambulation better today with right shoulder pain.  Assessment  & Plan :    Principal Problem:   Acute CHF (congestive heart failure) (HCC) Active Problems:   OSA (obstructive sleep apnea)   Essential hypertension   Atrial fibrillation (HCC)   Chronic combined systolic and diastolic CHF (congestive heart failure) (HCC)   Chronic renal failure   Liver cirrhosis secondary to NASH ([place])  Acute on chronic combined systolic/diastolic CHF -Most recent echo with EF 25-30% in 2016, repeat 2-D echo Showing improved EF to 35 %. There is hypokinesis of the mid-apicalanteroseptal and apical myocardium. - IV diuresis per cardiology, continue with IV Lasix 80 mg IV twice a day, - 13.7 L since admission, still significantly  volume overloaded with significant lower extremity edema and JVD continue with IV Lasix at current dose. - Follow on 2 view chest x-ray today to ensure improvement of right pleural effusion. - Patient has been refusing beta blockers in the past, NO  ARB/ACEI given renal failure., Continue with Imdur and hydralazine  History of atrial fibrillation -CHADSVASC Score of 4, Has refused anticoagulation , continue with aspirin 325 mg daily, status post AVN and pacemaker  Complete heart block - Status post permanent pacemaker  OSA - Continue Bipap QHS  CKD stage III - Stable on IV diuresis, continue to monitor .  Liver cirrhosis secondary to [facility] - LFTs within normal limits  Candidal skin infection - Continue with Nystatin powder, and  Diflucan, continue with local skin care, appears to be improving, will start on nystatin cream for left gluteal fold section.  Hypertension - Blood pressure is acceptable, continue with Imdur and hydralazine  Tinnitus - This appears to be chronic problem per patient, has been intermittent for a few years now, she was encouraged to see ENT as an outpatient.  Constipation - Resolved with laxatives  Right shoulder arthritis/tendinitis - Shoulder x-ray with evidence of arthritis, and you with warm compress, can't use NSAIDs given renal function  Abdominal pain - Patient abdominal pain in left upper quadrant, appears to be secondary to poor pleurisy, x-ray abdomen with abdominal gaseous distention in cecal area, patient has benign abdominal exam in that area, will repeat another x-ray tomorrow as they recommend plain film  for follow-up.  Code Status : Full  Family Communication  : D/W with daughter [name] [name] via phone  Disposition Plan  :  PT recommending SNF, but patient does not want go to SNF,, plan to go home with home with home care ,not ready for discharge yet, as she still needing IV diuresis.  Consults  :  Cardiology  Procedures  : none  DVT  Prophylaxis  :  Lovenox    Lab Results  Component Value Date   PLT 125 (L) 11/28/2016    Antibiotics  :    Anti-infectives    Start     Dose/Rate Route Frequency Ordered Stop   11/22/16 1300  fluconazole (DIFLUCAN) tablet 100 mg     100 mg Oral Daily 11/22/16 1223          Objective:   Vitals:   11/26/16 2115 11/27/16 0551 11/27/16 2130 11/28/16 0547  BP: (!) 106/54 (!) 106/51 111/63 95/66  Pulse: 89 90 (!) 104 (!) 106  Resp: 18 18 18 18   Temp: 98.1 F (36.7 C) 98.3 F (36.8 C) 98.3 F (36.8 C) 97.7 F (36.5 C)  TempSrc: Oral Oral Oral Axillary  SpO2: 94% 94% 94% 96%  Weight:  127 kg (280 lb)  125.6 kg (276 lb 14.4 oz)  Height:        Wt Readings from Last 3 Encounters:  11/28/16 125.6 kg (276 lb 14.4 oz)  10/26/16 132.9 kg (293 lb)  08/10/16 123.8 kg (273 lb)     Intake/Output Summary (Last 24 hours) at 11/28/16 0945 Last data filed at 11/28/16 0601  Gross per 24 hour  Intake              240 ml  Output             2400 ml  Net            -2160 ml     Physical Exam  Awake Alert, Oriented X 3, No new F.N deficits, Normal affect Supple Neck,+ JVD,  Symmetrical Chest wall movement, diminished air movement bilaterally, no wheezing Irreg,No Gallops,Rubs or new Murmurs, No Parasternal Heave +ve B.Sounds, Abd Soft, No tenderness, No rebound - guarding or rigidity. No Cyanosis, Clubbing , +2 edema, chronic lower extremity skin discoloration, as well as candidal rash and abdominal folds ( improving)and left gluteal fold.    Data Review:    CBC  Recent Labs Lab 11/21/16  1307 11/23/16 0435 11/28/16 0502  WBC 6.9 5.3 4.0  HGB 11.5* 10.5* 10.6*  HCT 34.9* 32.3* 32.5*  PLT 138* 123* 125*  MCV 96.1 97.6 96.2  MCH 31.7 31.7 31.4  MCHC 33.0 32.5 32.6  RDW 15.3 15.3 15.0  LYMPHSABS 1.2  --   --   MONOABS 0.6  --   --   EOSABS 0.2  --   --   BASOSABS 0.0  --   --     Chemistries   Recent Labs Lab 11/21/16 1307  11/24/16 0524 11/25/16 0637  11/26/16 0556 11/27/16 0502 11/28/16 0502  NA 139  < > 139 140 140 139 139  K 4.4  < > 3.8 3.9 3.8 4.0 4.2  CL 100*  < > 95* 94* 95* 94* 94*  CO2 29  < > 34* 35* 36* 36* 34*  GLUCOSE 122*  < > 92 92 85 82 84  BUN 40*  < > 42* 44* 47* 49* 53*  CREATININE 1.93*  < > 1.83* 1.83* 1.89* 2.02* 2.04*  CALCIUM 9.5  < > 9.1 9.1 9.1 9.1 9.3  MG  --   --   --  2.2  --   --   --   AST 33  --   --   --   --   --   --   ALT 20  --   --   --   --   --   --   ALKPHOS 65  --   --   --   --   --   --   BILITOT 0.8  --   --   --   --   --   --   < > = values in this interval not displayed. ------------------------------------------------------------------------------------------------------------------ No results for input(s): CHOL, HDL, LDLCALC, TRIG, CHOLHDL, LDLDIRECT in the last 72 hours.  Lab Results  Component Value Date   HGBA1C (H) 12/06/2010    6.2 (NOTE)                                                                       According to the ADA Clinical Practice Recommendations for 2011, when HbA1c is used as a screening test:   >=6.5%   Diagnostic of Diabetes Mellitus           (if abnormal result  is confirmed)  5.7-6.4%   Increased risk of developing Diabetes Mellitus  References:Diagnosis and Classification of Diabetes Mellitus,Diabetes Care,2011,34(Suppl 1):S62-S69 and Standards of Medical Care in         Diabetes - 2011,Diabetes MPNT,6144,31  (Suppl 1):S11-S61.   ------------------------------------------------------------------------------------------------------------------ No results for input(s): TSH, T4TOTAL, T3FREE, THYROIDAB in the last 72 hours.  Invalid input(s): FREET3 ------------------------------------------------------------------------------------------------------------------ No results for input(s): VITAMINB12, FOLATE, FERRITIN, TIBC, IRON, RETICCTPCT in the last 72 hours.  Coagulation profile No results for input(s): INR, PROTIME in the last 168 hours.  No results  for input(s): DDIMER in the last 72 hours.  Cardiac Enzymes  Recent Labs Lab 11/21/16 1605 11/21/16 2132 11/22/16 0402  TROPONINI 0.03* 0.03* 0.03*   ------------------------------------------------------------------------------------------------------------------    Component Value Date/Time   BNP 133.0 (H) 11/21/2016 1307   BNP 122.0 (H) 04/13/2014 1405    Inpatient Medications  Scheduled Meds: . allopurinol  300 mg Oral QHS  . ammonium lactate   Topical Daily  . aspirin  325 mg Oral Daily  . bisacodyl  10 mg Rectal Once  . enoxaparin (LOVENOX) injection  60 mg Subcutaneous Q24H  . ferrous sulfate  325 mg Oral QPM  . fluconazole  100 mg Oral Daily  . furosemide  80 mg Intravenous BID  . hydrALAZINE  12.5 mg Oral BID  . HYDROcodone-acetaminophen  1 tablet Oral TID  . isosorbide mononitrate  30 mg Oral Daily  . nystatin   Topical TID  . nystatin cream   Topical TID  . pantoprazole  40 mg Oral Daily  . potassium chloride SA  20 mEq Oral BID  . senna-docusate  2 tablet Oral BID  . vitamin B-12  500 mcg Oral Daily   Continuous Infusions: PRN Meds:.  Micro Results No results found for this or any previous visit (from the past 240 hour(s)).  Radiology Reports Dg Chest 2 View  Result Date: 11/21/2016 CLINICAL DATA:  Chest tightness. EXAM: CHEST  2 VIEW COMPARISON:  04/05/2016. FINDINGS: Cardiac pacer with lead tip over the right ventricle. Cardiomegaly with pulmonary vascular prominence and bilateral interstitial prominence with right side pleural effusion. Findings consistent with congestive heart failure. No pneumothorax. IMPRESSION: 1. Cardiac pacer with lead tip over the right ventricle. 2. Congestive heart failure with pulmonary venous congestion and right-sided pleural effusion . Electronically Signed   By: Marcello Moores  Register   On: 11/21/2016 13:52   Dg Shoulder Right Port  Result Date: 11/27/2016 CLINICAL DATA:  Right shoulder pain. EXAM: PORTABLE RIGHT SHOULDER  COMPARISON:  None. FINDINGS: There is no evidence of fracture or dislocation. There is slight arthritis of the [facility] joint and of the glenohumeral joint. Slight atelectasis or scarring at the right lung base. Soft tissues are unremarkable. IMPRESSION: No acute abnormality.  Arthritic changes as described. Electronically Signed   By: Lorriane Shire M.D.   On: 11/27/2016 15:14   Dg Abd Portable 2v  Result Date: 11/27/2016 CLINICAL DATA:  Abdominal pain. EXAM: PORTABLE ABDOMEN - 2 VIEW COMPARISON:  CT scan 04/05/2016 FINDINGS: The lung bases demonstrate cardiac enlargement and bibasilar scarring or atelectasis. A right ventricular pacer wires noted. No findings for small bowel obstruction or free air. Moderate distention of the cecum up to 11 cm. Scoliosis and degenerative changes involving the spine. IMPRESSION: Gaseous distention of the cecum. Could not exclude cecal volvulus, recommend clinical correlation. Plain film follow-up is suggested or CT may be helpful for further evaluation. Electronically Signed   By: Marijo Sanes M.D.   On: 11/27/2016 15:17     Javaris Wigington M.D on 11/28/2016 at 9:45 AM  Between 7am to 7pm - Pager - [phone]  After 7pm go to [email or web address] - password [facility]  [facility] -  Office  [phone]

## 2016-11-28 NOTE — Progress Notes (Signed)
Subjective:   Sill with some SOB   Objective:   Temp:  [97.7 F (36.5 C)-98.3 F (36.8 C)] 97.7 F (36.5 C) (03/06 0547) Pulse Rate:  [104-106] 106 (03/06 0547) Resp:  [18] 18 (03/06 0547) BP: (95-111)/(63-66) 95/66 (03/06 0547) SpO2:  [94 %-96 %] 96 % (03/06 0547) Weight:  [276 lb 14.4 oz (125.6 kg)] 276 lb 14.4 oz (125.6 kg) (03/06 0547) Last BM Date: 11/26/16  Filed Weights   11/26/16 0500 11/27/16 0551 11/28/16 0547  Weight: 295 lb 3.1 oz (133.9 kg) 280 lb (127 kg) 276 lb 14.4 oz (125.6 kg)    Intake/Output Summary (Last 24 hours) at 11/28/16 0900 Last data filed at 11/28/16 0601  Gross per 24 hour  Intake              240 ml  Output             2400 ml  Net            -2160 ml    Telemetry:n/a  Exam:  General: NAD  HEENT:sclear clear, throat clear  Resp: decreased breath sounds right lung  Cardiac: RRR, 3/6 systolic murmur at apex, +JVD  GI: abdomen soft, NT, Nd  MSK: 2+ bilateral LE edema  Neuro: no focal deficits  Psych: appropriate affect  Lab Results:  Basic Metabolic Panel:  Recent Labs Lab 11/25/16 0637 11/26/16 0556 11/27/16 0502 11/28/16 0502  NA 140 140 139 139  K 3.9 3.8 4.0 4.2  CL 94* 95* 94* 94*  CO2 35* 36* 36* 34*  GLUCOSE 92 85 82 84  BUN 44* 47* 49* 53*  CREATININE 1.83* 1.89* 2.02* 2.04*  CALCIUM 9.1 9.1 9.1 9.3  MG 2.2  --   --   --     Liver Function Tests:  Recent Labs Lab 11/21/16 1307  AST 33  ALT 20  ALKPHOS 65  BILITOT 0.8  PROT 7.4  ALBUMIN 4.3    CBC:  Recent Labs Lab 11/21/16 1307 11/23/16 0435 11/28/16 0502  WBC 6.9 5.3 4.0  HGB 11.5* 10.5* 10.6*  HCT 34.9* 32.3* 32.5*  MCV 96.1 97.6 96.2  PLT 138* 123* 125*    Cardiac Enzymes:  Recent Labs Lab 11/21/16 1605 11/21/16 2132 11/22/16 0402  TROPONINI 0.03* 0.03* 0.03*    BNP: No results for input(s): PROBNP in the last 8760 hours.  Coagulation: No results for input(s): INR in the last 168  hours.  ECG:   Medications:   Scheduled Medications: . allopurinol  300 mg Oral QHS  . ammonium lactate   Topical Daily  . aspirin  325 mg Oral Daily  . bisacodyl  10 mg Rectal Once  . enoxaparin (LOVENOX) injection  60 mg Subcutaneous Q24H  . ferrous sulfate  325 mg Oral QPM  . fluconazole  100 mg Oral Daily  . furosemide  80 mg Intravenous BID  . hydrALAZINE  12.5 mg Oral BID  . HYDROcodone-acetaminophen  1 tablet Oral TID  . isosorbide mononitrate  30 mg Oral Daily  . nystatin   Topical TID  . nystatin cream   Topical TID  . pantoprazole  40 mg Oral Daily  . potassium chloride SA  20 mEq Oral BID  . senna-docusate  2 tablet Oral BID  . vitamin B-12  500 mcg Oral Daily     Infusions:   PRN Medications:       Assessment/Plan    1. Acute on chronic combined systolic/diastolic HF -  echo 11/22/16 LVEF 35%, cannot eval diastoilc function, mild AS, mild MR,  - negative 1.9 liters yesterday though I/Os are incomplete, total negative around 13.8 liters since admission. She is on lasix IV 37m  bid. She gas received one 5716mdose of metolazone once. Mild uptrend in Cr though within her previous historical range - medical therapy with hydralazine 12.16m716mid, imdur 30. No ACE/ARB/aldactone due to poor renal function. Patient has refused beta blocker due to reported intolerance.  - continue IV diuretics, we will repeat CXR today. Right sided breath sounds quite diminished compared to left, reevaluate pulm edema and her effusion. Still with elevated JVD and LE edema - was on lasix 45m3md at home. Will change to torsemide 40mg71m at discharge.   2. Chronic afib - refuses anticoagulation, remains on ASA - has not required av nodal agent  3. CKD III-IV       [name] [name].

## 2016-11-28 NOTE — Care Management Note (Signed)
Case Management Note  Patient Details  Name: [name] [name] MRN: [id number] Date of Birth: 1946/01/29  If discussed at [place] Length of Stay Meetings, dates discussed:  11/28/2016  Additional Comments:  Evella Kasal, Chauncey Reading, RN 11/28/2016, 10:24 AM

## 2016-11-29 ENCOUNTER — Inpatient Hospital Stay (HOSPITAL_COMMUNITY): Payer: Medicare Other

## 2016-11-29 DIAGNOSIS — I5041 Acute combined systolic (congestive) and diastolic (congestive) heart failure: Secondary | ICD-10-CM

## 2016-11-29 LAB — BASIC METABOLIC PANEL
ANION GAP: 8 (ref 5–15)
BUN: 52 mg/dL — ABNORMAL HIGH (ref 6–20)
CALCIUM: 9.3 mg/dL (ref 8.9–10.3)
CO2: 37 mmol/L — AB (ref 22–32)
CREATININE: 2 mg/dL — AB (ref 0.44–1.00)
Chloride: 94 mmol/L — ABNORMAL LOW (ref 101–111)
GFR, EST AFRICAN AMERICAN: 28 mL/min — AB (ref >60)
GFR, EST NON AFRICAN AMERICAN: 24 mL/min — AB (ref >60)
Glucose, Bld: 85 mg/dL (ref 65–99)
Potassium: 3.9 mmol/L (ref 3.5–5.1)
SODIUM: 139 mmol/L (ref 135–145)

## 2016-11-29 NOTE — Progress Notes (Signed)
PROGRESS NOTE    [name] [name]  BMW:[id number] DOB: 31-Dec-1945 DOA: 11/21/2016 PCP: [name], MD     Brief Narrative:  71 year old woman admitted to the hospital on 2/27 due to shortness of breath. Found to have acute on chronic combined CHF and admitted for further evaluation and management.   Assessment & Plan:   Principal Problem:   Acute CHF (congestive heart failure) (HCC) Active Problems:   OSA (obstructive sleep apnea)   Essential hypertension   Atrial fibrillation (HCC)   Chronic combined systolic and diastolic CHF (congestive heart failure) (HCC)   Chronic renal failure   Liver cirrhosis secondary to NASH ([place])   Acute combined CHF -Echo from February 2018 shows an ejection fraction of 35% with hypokinesis of the mid apical anteroseptal and apical myocardium not technically sufficient to allow evaluation of LV diastolic function. -She has diuresed over 60 L since admission, however remains hypervolemic on exam. -Cardiology is on board and they're recommending continuing IV diuresis for now. -No ACE inhibitor/ARB due to renal function, patient has refused beta blocker due to a reported intolerance. -Chest x-ray continues to show pleural effusion. -We'll likely need torsemide 40 mg twice daily on discharge.  Chronic kidney disease stage 3-4 -Baseline creatinine around 1.8-2. -Renal function remains at baseline.  Chronic atrial fibrillation -On daily aspirin as she refuses anticoagulation therapy. -Has not required an AV nodal agent.  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  Morbid obesity  Liver cirrhosis secondary to [facility] -LFTs are within normal limits.  Candidal skin infection -Continue Diflucan and nystatin powder.  Hypertension -Fair control, continue home medications.  DVT prophylaxis: Lovenox Code Status: Partial code Family Communication: Patient only Disposition Plan:  Anticipate discharge home in 48-72 hours  Consultants:   Cardiology  Procedures:   None  Antimicrobials:  Anti-infectives    Start     Dose/Rate Route Frequency Ordered Stop   11/22/16 1300  fluconazole (DIFLUCAN) tablet 100 mg     100 mg Oral Daily 11/22/16 1223         Subjective: Feels well, abdominal pain is improved.  Objective: Vitals:   11/28/16 1129 11/28/16 1358 11/28/16 2100 11/29/16 0500  BP: (!) 133/59 124/68 (!) 107/53 (!) 112/51  Pulse: 96 98 (!) 105 (!) 105  Resp:  18 18 18   Temp:  98.4 F (36.9 C) 98 F (36.7 C) 98.8 F (37.1 C)  TempSrc:   Oral Oral  SpO2: 97% 98% 97% 94%  Weight:    122.4 kg (269 lb 13.5 oz)  Height:        Intake/Output Summary (Last 24 hours) at 11/29/16 1427 Last data filed at 11/29/16 0800  Gross per 24 hour  Intake              240 ml  Output             2175 ml  Net            -1935 ml   Filed Weights   11/27/16 0551 11/28/16 0547 11/29/16 0500  Weight: 127 kg (280 lb) 125.6 kg (276 lb 14.4 oz) 122.4 kg (269 lb 13.5 oz)    Examination:  General exam: Alert, awake, oriented x 3,Morbidly obese Respiratory system: Clear to auscultation. Respiratory effort normal. Cardiovascular system:RRR. No murmurs, rubs, gallops. Gastrointestinal system: Abdomen is nondistended, soft and nontender. No organomegaly or masses felt. Normal bowel sounds heard. Central nervous system: Alert and oriented. No focal neurological  deficits. Extremities: No C/C/E, +pedal pulses Skin: No rashes, lesions or ulcers Psychiatry: Judgement and insight appear normal. Mood & affect appropriate.     Data Reviewed: I have personally reviewed following labs and imaging studies  CBC:  Recent Labs Lab 11/23/16 0435 11/28/16 0502  WBC 5.3 4.0  HGB 10.5* 10.6*  HCT 32.3* 32.5*  MCV 97.6 96.2  PLT 123* 196*   Basic Metabolic Panel:  Recent Labs Lab 11/25/16 0637 11/26/16 0556 11/27/16 0502 11/28/16 0502 11/29/16 0540  NA 140 140 139  139 139  K 3.9 3.8 4.0 4.2 3.9  CL 94* 95* 94* 94* 94*  CO2 35* 36* 36* 34* 37*  GLUCOSE 92 85 82 84 85  BUN 44* 47* 49* 53* 52*  CREATININE 1.83* 1.89* 2.02* 2.04* 2.00*  CALCIUM 9.1 9.1 9.1 9.3 9.3  MG 2.2  --   --   --   --    GFR: Estimated Creatinine Clearance: 34.4 mL/min (by C-G formula based on SCr of 2 mg/dL (H)). Liver Function Tests: No results for input(s): AST, ALT, ALKPHOS, BILITOT, PROT, ALBUMIN in the last 168 hours. No results for input(s): LIPASE, AMYLASE in the last 168 hours. No results for input(s): AMMONIA in the last 168 hours. Coagulation Profile: No results for input(s): INR, PROTIME in the last 168 hours. Cardiac Enzymes: No results for input(s): CKTOTAL, CKMB, CKMBINDEX, TROPONINI in the last 168 hours. BNP (last 3 results) No results for input(s): PROBNP in the last 8760 hours. HbA1C: No results for input(s): HGBA1C in the last 72 hours. CBG: No results for input(s): GLUCAP in the last 168 hours. Lipid Profile: No results for input(s): CHOL, HDL, LDLCALC, TRIG, CHOLHDL, LDLDIRECT in the last 72 hours. Thyroid Function Tests: No results for input(s): TSH, T4TOTAL, FREET4, T3FREE, THYROIDAB in the last 72 hours. Anemia Panel: No results for input(s): VITAMINB12, FOLATE, FERRITIN, TIBC, IRON, RETICCTPCT in the last 72 hours. Urine analysis:    Component Value Date/Time   COLORURINE YELLOW 04/05/2016 1850   APPEARANCEUR CLEAR 04/05/2016 1850   LABSPEC 1.015 04/05/2016 1850   PHURINE 6.0 04/05/2016 1850   GLUCOSEU NEGATIVE 04/05/2016 1850   HGBUR NEGATIVE 04/05/2016 1850   BILIRUBINUR NEGATIVE 04/05/2016 1850   KETONESUR NEGATIVE 04/05/2016 1850   PROTEINUR NEGATIVE 04/05/2016 1850   UROBILINOGEN 0.2 03/06/2013 2035   NITRITE NEGATIVE 04/05/2016 1850   LEUKOCYTESUR SMALL (A) 04/05/2016 1850   Sepsis Labs: @LABRCNTIP (procalcitonin:4,lacticidven:4)  )No results found for this or any previous visit (from the past 240 hour(s)).        Radiology Studies: Dg Chest 1 View  Result Date: 11/28/2016 CLINICAL DATA:  Shortness of breath. EXAM: CHEST 1 VIEW COMPARISON:  Radiographs of November 21, 2016. FINDINGS: Stable cardiomegaly. Atherosclerosis of thoracic aorta is noted. Stable position of single lead left-sided pacemaker. No pneumothorax is noted. Mild central pulmonary vascular congestion is noted. Stable right basilar opacity is noted consistent with atelectasis or infiltrate with associated pleural effusion. Mild degenerative joint disease of right glenohumeral joint is noted. IMPRESSION: Aortic atherosclerosis. Mild central pulmonary vascular congestion. Stable right basilar atelectasis or infiltrate with associated pleural effusion. Electronically Signed   By: Marijo Conception, M.D.   On: 11/28/2016 09:57   Dg Shoulder Right Port  Result Date: 11/27/2016 CLINICAL DATA:  Right shoulder pain. EXAM: PORTABLE RIGHT SHOULDER COMPARISON:  None. FINDINGS: There is no evidence of fracture or dislocation. There is slight arthritis of the [facility] joint and of the glenohumeral joint. Slight atelectasis or scarring at  the right lung base. Soft tissues are unremarkable. IMPRESSION: No acute abnormality.  Arthritic changes as described. Electronically Signed   By: Lorriane Shire M.D.   On: 11/27/2016 15:14   Dg Abd Portable 2v  Result Date: 11/29/2016 CLINICAL DATA:  Abdominal pain and distension, follow-up cecal distention EXAM: PORTABLE ABDOMEN - 2 VIEW COMPARISON:  11/27/2016 FINDINGS: Scattered large and small bowel gas is noted. The cecum is decompressed and of normal caliber. No free air is seen. Right-sided pleural effusion is noted. IMPRESSION: Right pleural effusion. No abdominal abnormality noted. Electronically Signed   By: Inez Catalina M.D.   On: 11/29/2016 07:35   Dg Abd Portable 2v  Result Date: 11/27/2016 CLINICAL DATA:  Abdominal pain. EXAM: PORTABLE ABDOMEN - 2 VIEW COMPARISON:  CT scan 04/05/2016 FINDINGS: The lung bases  demonstrate cardiac enlargement and bibasilar scarring or atelectasis. A right ventricular pacer wires noted. No findings for small bowel obstruction or free air. Moderate distention of the cecum up to 11 cm. Scoliosis and degenerative changes involving the spine. IMPRESSION: Gaseous distention of the cecum. Could not exclude cecal volvulus, recommend clinical correlation. Plain film follow-up is suggested or CT may be helpful for further evaluation. Electronically Signed   By: Marijo Sanes M.D.   On: 11/27/2016 15:17        Scheduled Meds: . allopurinol  300 mg Oral QHS  . ammonium lactate   Topical Daily  . aspirin  325 mg Oral Daily  . bisacodyl  10 mg Rectal Once  . enoxaparin (LOVENOX) injection  60 mg Subcutaneous Q24H  . ferrous sulfate  325 mg Oral QPM  . fluconazole  100 mg Oral Daily  . furosemide  80 mg Intravenous BID  . hydrALAZINE  12.5 mg Oral BID  . HYDROcodone-acetaminophen  1 tablet Oral TID  . isosorbide mononitrate  30 mg Oral Daily  . nystatin   Topical TID  . nystatin cream   Topical TID  . pantoprazole  40 mg Oral Daily  . potassium chloride SA  20 mEq Oral BID  . senna-docusate  2 tablet Oral BID  . vitamin B-12  500 mcg Oral Daily   Continuous Infusions:   LOS: 8 days    Time spent: 25 minutes. Greater than 50% of this time was spent in direct contact with the patient coordinating care.     Lelon Frohlich, MD [facility] Pager [phone]  If 7PM-7AM, please contact night-coverage [email or web address] Password [facility] 11/29/2016, 2:27 PM

## 2016-11-29 NOTE — Progress Notes (Addendum)
Physical Therapy Treatment Patient Details Name: [name] [name] MRN: [id number] DOB: [birth year] Today's Date: 11/29/2016    History of Present Illness [name] [name] is a 71 y.o. 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  She reports she previously spoke to her cardiologist about this and ultimately called her primary care physician but states she did not feel she was heard (of note in the last cardiology office note she was offered metolazone for her heart failure but she declined).  She then states her symptoms truly began over six months ago but have gradually worsened.  She has noticed an increase in her edema in the past three months.  She reports her shortness of breath is so significant that standing up to pivot to the toilet makes her short of breath.  She reports that she has chronic kidney failure and that she feels as though everything is so intertwined she is worried about trying any new medication because she is worried that it will affect one of her failing organs.  She mentions that she was told only within the past year she has cirrhosis. She has chronic peripheral edema and venous insufficiency.  Last echocardiogram was in February 2016 which showed a left ventricle: The cavity size was normal. There was mild concentric hypertrophy. Systolic function was severely reduced. The estimated ejection fraction was in the range of 25% to 30%.    PT Comments    Pt received sitting up in the chair, and is marginally agreeable to PT tx.  Pt needs extensive education on the purpose of PT while in the acute  care setting.  Pt keeps saying "well I could do this before I came into the hospital, so I'll be able to do it when I get back home." Pt also repeats that she "can't walk," and PT again educated her that the goals of acute care PT are to assist her with returning her mobility back to its baseline.  Pt was then agreeable to participate with transfer training, however she continues to not allow PT to hold to gait belt during transfer.  Overall, mobility is very slow, and she has difficulty with advancing her LE's due to a combination of weakness, continued edema, as well as pain.  Recommend that pt return home with HHPT, as well as have 24/7 supervision/assistance.     Follow Up Recommendations  Home health PT;Supervision/Assistance - 24 hour     Equipment Recommendations  None recommended by PT (Pt has all DME needed at this point. )    Recommendations for Other Services       Precautions / Restrictions Precautions Precautions: Fall Precaution Comments: Due to immobility.  Restrictions Weight Bearing Restrictions: No    Mobility  Bed Mobility                  Transfers Overall transfer level: Needs assistance Equipment used: Rolling walker (2 wheeled) Transfers: Sit to/from [employer] Transfers Sit to Stand: Supervision Stand pivot transfers: Supervision       General transfer comment: Pt requires increased time to be able to use momentum to stand.  Pt educated on need for gait belt  for safety, but continues to state "don't touch me" as pt is transferring.  She was able to perform this transfer x 2 reps, but requires increased time for rest in between transfers.    Ambulation/Gait                 Stairs            Wheelchair Mobility    Modified Rankin (Stroke Patients Only)       Balance Overall balance assessment: Needs assistance Sitting-balance support: Bilateral upper extremity supported;Feet supported Sitting balance-Leahy Scale: Good      Standing balance support: Bilateral upper extremity supported Standing balance-Leahy Scale: Fair                      Cognition Arousal/Alertness: Awake/alert Behavior During Therapy: WFL for tasks assessed/performed Overall Cognitive Status: Within Functional Limits for tasks assessed                 General Comments: pt is very specific on where she likes things placed during mobility.  Pt is also very sarcastic during PT session.       Exercises      General Comments        Pertinent Vitals/Pain Pain Location: shoulder - but does not rate today Pain Intervention(s): Limited activity within patient's tolerance;Monitored during session;Repositioned    Home Living                      Prior Function            PT Goals (current goals can now be found in the care plan section) Acute Rehab PT Goals Patient Stated Goal: to go home PT Goal Formulation: With patient/family Time For Goal Achievement: 12/06/16 Potential to Achieve Goals: Fair Progress towards PT goals: Progressing toward goals    Frequency           PT Plan Discharge plan needs to be updated    Co-evaluation             End of Session Equipment Utilized During Treatment: Gait belt Activity Tolerance: Patient limited by fatigue Patient left: in chair;with call bell/phone within reach;with family/visitor present   PT Visit Diagnosis: Other abnormalities of gait and mobility (R26.89);Muscle weakness (generalized) (M62.81)     Time: 3382-5053 PT Time Calculation (min) (ACUTE ONLY): 53 min  Charges:  $Therapeutic Activity: 23-37 mins $Self Care/Home Management: 23-37                    G Codes:       Beth Alyah Boehning, PT, DPT X: [phone]

## 2016-11-29 NOTE — Progress Notes (Signed)
Subjective:    SOB improving, not resolved  Objective:   Temp:  [98 F (36.7 C)-98.8 F (37.1 C)] 98.8 F (37.1 C) (03/07 0500) Pulse Rate:  [96-105] 105 (03/07 0500) Resp:  [18] 18 (03/07 0500) BP: (107-133)/(51-68) 112/51 (03/07 0500) SpO2:  [94 %-98 %] 94 % (03/07 0500) Weight:  [269 lb 13.5 oz (122.4 kg)] 269 lb 13.5 oz (122.4 kg) (03/07 0500) Last BM Date: 11/26/16  Filed Weights   11/27/16 0551 11/28/16 0547 11/29/16 0500  Weight: 280 lb (127 kg) 276 lb 14.4 oz (125.6 kg) 269 lb 13.5 oz (122.4 kg)    Intake/Output Summary (Last 24 hours) at 11/29/16 5701 Last data filed at 11/29/16 0500  Gross per 24 hour  Intake              360 ml  Output             2175 ml  Net            -1815 ml    Telemetry:n/a  Exam:  General: NAD  HEENT: sclera clear, throat clear  Resp: decreased breath sounds on the right.   Cardiac: irreg, 3/6 systolic murmur at apex  GI: abdomen soft, NT, nD  MSK: 1+ bilatearl LE edema  Neuro: no focal deficits  Psych: appropriate affect  Lab Results:  Basic Metabolic Panel:  Recent Labs Lab 11/25/16 0637  11/27/16 0502 11/28/16 0502 11/29/16 0540  NA 140  < > 139 139 139  K 3.9  < > 4.0 4.2 3.9  CL 94*  < > 94* 94* 94*  CO2 35*  < > 36* 34* 37*  GLUCOSE 92  < > 82 84 85  BUN 44*  < > 49* 53* 52*  CREATININE 1.83*  < > 2.02* 2.04* 2.00*  CALCIUM 9.1  < > 9.1 9.3 9.3  MG 2.2  --   --   --   --   < > = values in this interval not displayed.  Liver Function Tests: No results for input(s): AST, ALT, ALKPHOS, BILITOT, PROT, ALBUMIN in the last 168 hours.  CBC:  Recent Labs Lab 11/23/16 0435 11/28/16 0502  WBC 5.3 4.0  HGB 10.5* 10.6*  HCT 32.3* 32.5*  MCV 97.6 96.2  PLT 123* 125*    Cardiac Enzymes: No results for input(s): CKTOTAL, CKMB, CKMBINDEX, TROPONINI in the last 168 hours.  BNP: No results for input(s): PROBNP in the last 8760 hours.  Coagulation: No results for input(s): INR in the last 168  hours.  ECG:   Medications:   Scheduled Medications: . allopurinol  300 mg Oral QHS  . ammonium lactate   Topical Daily  . aspirin  325 mg Oral Daily  . bisacodyl  10 mg Rectal Once  . enoxaparin (LOVENOX) injection  60 mg Subcutaneous Q24H  . ferrous sulfate  325 mg Oral QPM  . fluconazole  100 mg Oral Daily  . furosemide  80 mg Intravenous BID  . hydrALAZINE  12.5 mg Oral BID  . HYDROcodone-acetaminophen  1 tablet Oral TID  . isosorbide mononitrate  30 mg Oral Daily  . nystatin   Topical TID  . nystatin cream   Topical TID  . pantoprazole  40 mg Oral Daily  . potassium chloride SA  20 mEq Oral BID  . senna-docusate  2 tablet Oral BID  . vitamin B-12  500 mcg Oral Daily     Infusions:   PRN Medications:  polyvinyl alcohol, simethicone  Assessment/Plan   1. Acute on chronic combined systolic/diastolic HF - echo 3/72/90 LVEF 35%, cannot eval diastoilc function, mild AS, mild MR,  - negative 1.6 liters yesterday though I/Os are incomplete, total negative around 16.2 liters since admission. She is on lasix IV 1053m  bid. She gas received one 533mdose of metolazone this admission. Mild uptrend in Cr though within her previous historical range, stable from yesterday.  - medical therapy with hydralazine 12.53m7mid, imdur 30. No ACE/ARB/aldactone due to poor renal function. Patient has refused beta blocker due to reported intolerance.  - CXR yesterday with continued pulmonary edema, right sided effusion - continue IV diuretics.  - was on lasix 21m49md at home. Will need to change to torsemide 40mg22m at discharge.   2. Chronic afib - refuses anticoagulation, remains on ASA - has not required av nodal agent  3. CKD III-IV        JonatCarlyle Dolly.

## 2016-11-30 DIAGNOSIS — I5042 Chronic combined systolic (congestive) and diastolic (congestive) heart failure: Secondary | ICD-10-CM

## 2016-11-30 LAB — BASIC METABOLIC PANEL
Anion gap: 8 (ref 5–15)
BUN: 51 mg/dL — AB (ref 6–20)
CHLORIDE: 93 mmol/L — AB (ref 101–111)
CO2: 36 mmol/L — ABNORMAL HIGH (ref 22–32)
CREATININE: 1.99 mg/dL — AB (ref 0.44–1.00)
Calcium: 9.4 mg/dL (ref 8.9–10.3)
GFR calc Af Amer: 28 mL/min — ABNORMAL LOW (ref >60)
GFR calc non Af Amer: 24 mL/min — ABNORMAL LOW (ref >60)
Glucose, Bld: 81 mg/dL (ref 65–99)
POTASSIUM: 3.9 mmol/L (ref 3.5–5.1)
SODIUM: 137 mmol/L (ref 135–145)

## 2016-11-30 NOTE — Care Management (Signed)
CM updated patient of [place]. [facility] will provide all the services that patient needs at time of discharge.  CM gave list of private duty care agencies as requested by family.

## 2016-11-30 NOTE — Progress Notes (Signed)
PROGRESS NOTE    [name] [name]  UKG:[id number] DOB: [birth year] DOA: 11/21/2016 PCP: [name], MD     Brief Narrative:  71 year old woman admitted to the hospital on 2/27 due to shortness of breath. Found to have acute on chronic combined CHF and admitted for further evaluation and management.   Assessment & Plan:   Principal Problem:   Acute CHF (congestive heart failure) (HCC) Active Problems:   OSA (obstructive sleep apnea)   Essential hypertension   Atrial fibrillation (HCC)   Chronic combined systolic and diastolic CHF (congestive heart failure) (HCC)   Chronic renal failure   Liver cirrhosis secondary to NASH ([place])   Acute combined CHF -Echo from February 2018 shows an ejection fraction of 35% with hypokinesis of the mid apical anteroseptal and apical myocardium not technically sufficient to allow evaluation of LV diastolic function. -She has diuresed close to 20 L since admission, however remains hypervolemic on exam. -Cardiology is on board and they're recommending continuing IV diuresis for now. -No ACE inhibitor/ARB due to renal function, patient has refused beta blocker due to a reported intolerance. -We'll likely need torsemide 40 mg twice daily on discharge.  Chronic kidney disease stage 3-4 -Baseline creatinine around 1.8-2. -Renal function remains at baseline.  Chronic atrial fibrillation -On daily aspirin as she refuses anticoagulation therapy. -Has not required an AV nodal agent.  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  Morbid obesity  Liver cirrhosis secondary to [facility] -LFTs are within normal limits.  Candidal skin infection -Continue Diflucan and nystatin powder.  Hypertension -Fair control, continue home medications.  DVT prophylaxis: Lovenox Code Status: Partial code Family Communication: Patient only Disposition Plan: Anticipate discharge home in 48-72  hours  Consultants:   Cardiology  Procedures:   None  Antimicrobials:  Anti-infectives    Start     Dose/Rate Route Frequency Ordered Stop   11/22/16 1300  fluconazole (DIFLUCAN) tablet 100 mg     100 mg Oral Daily 11/22/16 1223         Subjective: Feels well, abdominal pain is improved.  Objective: Vitals:   11/30/16 0313 11/30/16 0500 11/30/16 0808 11/30/16 1407  BP:  116/63  (!) 102/51  Pulse:  (!) 104  94  Resp:  18  18  Temp:  98.5 F (36.9 C)  97.8 F (36.6 C)  TempSrc:  Oral  Oral  SpO2:  95% 93% 95%  Weight: 121.8 kg (268 lb 8.3 oz)     Height:        Intake/Output Summary (Last 24 hours) at 11/30/16 1704 Last data filed at 11/30/16 1300  Gross per 24 hour  Intake              720 ml  Output             4801 ml  Net            -4081 ml   Filed Weights   11/28/16 0547 11/29/16 0500 11/30/16 0313  Weight: 125.6 kg (276 lb 14.4 oz) 122.4 kg (269 lb 13.5 oz) 121.8 kg (268 lb 8.3 oz)    Examination:  General exam: Alert, awake, oriented x 3,Morbidly obese Respiratory system: Clear to auscultation. Respiratory effort normal. Cardiovascular system:RRR. No murmurs, rubs, gallops. Gastrointestinal system: Abdomen is nondistended, soft and nontender. No organomegaly or masses felt. Normal bowel sounds heard. Central nervous system: Alert and oriented. No focal neurological deficits. Extremities: No C/C/E, +pedal pulses Skin: No rashes, lesions  or ulcers Psychiatry: Judgement and insight appear normal. Mood & affect appropriate.     Data Reviewed: I have personally reviewed following labs and imaging studies  CBC:  Recent Labs Lab 11/28/16 0502  WBC 4.0  HGB 10.6*  HCT 32.5*  MCV 96.2  PLT 865*   Basic Metabolic Panel:  Recent Labs Lab 11/25/16 0637 11/26/16 0556 11/27/16 0502 11/28/16 0502 11/29/16 0540 11/30/16 0537  NA 140 140 139 139 139 137  K 3.9 3.8 4.0 4.2 3.9 3.9  CL 94* 95* 94* 94* 94* 93*  CO2 35* 36* 36* 34* 37* 36*   GLUCOSE 92 85 82 84 85 81  BUN 44* 47* 49* 53* 52* 51*  CREATININE 1.83* 1.89* 2.02* 2.04* 2.00* 1.99*  CALCIUM 9.1 9.1 9.1 9.3 9.3 9.4  MG 2.2  --   --   --   --   --    GFR: Estimated Creatinine Clearance: 34.5 mL/min (by C-G formula based on SCr of 1.99 mg/dL (H)). Liver Function Tests: No results for input(s): AST, ALT, ALKPHOS, BILITOT, PROT, ALBUMIN in the last 168 hours. No results for input(s): LIPASE, AMYLASE in the last 168 hours. No results for input(s): AMMONIA in the last 168 hours. Coagulation Profile: No results for input(s): INR, PROTIME in the last 168 hours. Cardiac Enzymes: No results for input(s): CKTOTAL, CKMB, CKMBINDEX, TROPONINI in the last 168 hours. BNP (last 3 results) No results for input(s): PROBNP in the last 8760 hours. HbA1C: No results for input(s): HGBA1C in the last 72 hours. CBG: No results for input(s): GLUCAP in the last 168 hours. Lipid Profile: No results for input(s): CHOL, HDL, LDLCALC, TRIG, CHOLHDL, LDLDIRECT in the last 72 hours. Thyroid Function Tests: No results for input(s): TSH, T4TOTAL, FREET4, T3FREE, THYROIDAB in the last 72 hours. Anemia Panel: No results for input(s): VITAMINB12, FOLATE, FERRITIN, TIBC, IRON, RETICCTPCT in the last 72 hours. Urine analysis:    Component Value Date/Time   COLORURINE YELLOW 04/05/2016 1850   APPEARANCEUR CLEAR 04/05/2016 1850   LABSPEC 1.015 04/05/2016 1850   PHURINE 6.0 04/05/2016 1850   GLUCOSEU NEGATIVE 04/05/2016 1850   HGBUR NEGATIVE 04/05/2016 1850   BILIRUBINUR NEGATIVE 04/05/2016 1850   KETONESUR NEGATIVE 04/05/2016 1850   PROTEINUR NEGATIVE 04/05/2016 1850   UROBILINOGEN 0.2 03/06/2013 2035   NITRITE NEGATIVE 04/05/2016 1850   LEUKOCYTESUR SMALL (A) 04/05/2016 1850   Sepsis Labs: @LABRCNTIP (procalcitonin:4,lacticidven:4)  )No results found for this or any previous visit (from the past 240 hour(s)).       Radiology Studies: Dg Abd Portable 2v  Result Date:  11/29/2016 CLINICAL DATA:  Abdominal pain and distension, follow-up cecal distention EXAM: PORTABLE ABDOMEN - 2 VIEW COMPARISON:  11/27/2016 FINDINGS: Scattered large and small bowel gas is noted. The cecum is decompressed and of normal caliber. No free air is seen. Right-sided pleural effusion is noted. IMPRESSION: Right pleural effusion. No abdominal abnormality noted. Electronically Signed   By: Inez Catalina M.D.   On: 11/29/2016 07:35        Scheduled Meds: . allopurinol  300 mg Oral QHS  . ammonium lactate   Topical Daily  . aspirin  325 mg Oral Daily  . bisacodyl  10 mg Rectal Once  . enoxaparin (LOVENOX) injection  60 mg Subcutaneous Q24H  . ferrous sulfate  325 mg Oral QPM  . fluconazole  100 mg Oral Daily  . furosemide  80 mg Intravenous BID  . hydrALAZINE  12.5 mg Oral BID  . HYDROcodone-acetaminophen  1  tablet Oral TID  . isosorbide mononitrate  30 mg Oral Daily  . nystatin   Topical TID  . nystatin cream   Topical TID  . pantoprazole  40 mg Oral Daily  . potassium chloride SA  20 mEq Oral BID  . senna-docusate  2 tablet Oral BID  . vitamin B-12  500 mcg Oral Daily   Continuous Infusions:   LOS: 9 days    Time spent: 25 minutes. Greater than 50% of this time was spent in direct contact with the patient coordinating care.     Lelon Frohlich, MD [facility] Pager [phone]  If 7PM-7AM, please contact night-coverage [email or web address] Password [email or web address] 11/30/2016, 5:04 PM

## 2016-11-30 NOTE — Progress Notes (Addendum)
Occupational Therapy Treatment Patient Details Name: [name] [name] MRN: [id number] DOB: Sep 05, 1946 Today's Date: 11/30/2016    History of present illness 71 year old woman admitted to the hospital on 2/27 due to shortness of breath. Found to have acute on chronic combined CHF and admitted for further evaluation and management.   OT comments  Patient up in recliner and agreeable to participate in OT treatment session. No questions regarding [employer] technique handout that was given previously this week. Patient states that she was already aware of the techniques and was using them at home. Patient requested to use the [facility] during session. Pt completed transfer with increased time and therapist providing set-up and supervision. Pt did require total assist for toilet hygiene as patient states that the arm rest of commode make it so she is unable to complete lateral leans to wipe. Patient states that at home she is able to clean herself without difficulty as she performs lateral leans right and left before standing.   Follow Up Recommendations  Home health OT    Equipment Recommendations  None recommended by OT    Recommendations for Other Services      Precautions / Restrictions Precautions Precautions: Fall Precaution Comments: Due to immobility.  Restrictions Weight Bearing Restrictions: No       Mobility                   Transfers Overall transfer level: Needs assistance Equipment used: Rolling walker (2 wheeled) Transfers: Sit to/from [employer] Transfers Sit to Stand: Supervision Stand pivot transfers: Supervision       General transfer comment: Increased time to complete sit to stand. patient utilized relaxation breathing techniques independently before completing transfer.     Balance                                   ADL Overall ADL's : Needs assistance/impaired                         Toilet Transfer:  Supervision/safety;RW;BSC   Toileting- Clothing Manipulation and Hygiene: Total assistance;Sit to/from stand                          Cognition   Behavior During Therapy: [facility] for tasks assessed/performed Overall Cognitive Status: Within Functional Limits for tasks assessed                  General Comments: pt reports that she likes to "pick" on people although she does it with a straight face.                 Pertinent Vitals/ Pain       Pain Location: right shoulder - but does not rate today Pain Descriptors / Indicators: Grimacing Pain Intervention(s): Monitored during session         Frequency  Min 2X/week        Progress Toward Goals  OT Goals(current goals can now be found in the care plan section)  OT goals met/All education complete. Discharge from OT services.      Plan Discharge plan remains appropriate       End of Session Equipment Utilized During Treatment: Rolling walker  OT Visit Diagnosis: Muscle weakness (generalized) (M62.81)   Activity Tolerance Patient tolerated treatment well   Patient Left in chair;with call bell/phone within reach  Nurse Communication          Time: 0029-8473 OT Time Calculation (min): 25 min  Charges: OT General Charges $OT Visit: 1 Procedure OT Treatments $Self Care/Home Management : 23-37 mins  Ailene Ravel, OTR/L,CBIS  [phone]    Jewett Mcgann, Clarene Duke 11/30/2016, 9:08 AM

## 2016-11-30 NOTE — Progress Notes (Signed)
Subjective:    SOB improving  Objective:   Temp:  [98.3 F (36.8 C)-98.5 F (36.9 C)] 98.5 F (36.9 C) (03/08 0500) Pulse Rate:  [97-107] 104 (03/08 0500) Resp:  [18] 18 (03/08 0500) BP: (116-125)/(60-75) 116/63 (03/08 0500) SpO2:  [94 %-96 %] 95 % (03/08 0500) Weight:  [268 lb 8.3 oz (121.8 kg)] 268 lb 8.3 oz (121.8 kg) (03/08 0313) Last BM Date: 11/29/16  Filed Weights   11/28/16 0547 11/29/16 0500 11/30/16 0313  Weight: 276 lb 14.4 oz (125.6 kg) 269 lb 13.5 oz (122.4 kg) 268 lb 8.3 oz (121.8 kg)    Intake/Output Summary (Last 24 hours) at 11/30/16 0918 Last data filed at 11/30/16 0500  Gross per 24 hour  Intake              480 ml  Output             3551 ml  Net            -3071 ml    Telemetry: N/A  Exam:  General: NAD  HEENT: sclera clear, throat clear  Resp: mild crackels bilateral bases  Cardiac: irreg, 3/6 systolic murmur at apex, elevated JVD  GI: abdomen soft, NT, ND  MSK: 1+ bilateral LE edema  Neuro: no focal deficits  Psych: appropriate affect  Lab Results:  Basic Metabolic Panel:  Recent Labs Lab 11/25/16 0637  11/28/16 0502 11/29/16 0540 11/30/16 0537  NA 140  < > 139 139 137  K 3.9  < > 4.2 3.9 3.9  CL 94*  < > 94* 94* 93*  CO2 35*  < > 34* 37* 36*  GLUCOSE 92  < > 84 85 81  BUN 44*  < > 53* 52* 51*  CREATININE 1.83*  < > 2.04* 2.00* 1.99*  CALCIUM 9.1  < > 9.3 9.3 9.4  MG 2.2  --   --   --   --   < > = values in this interval not displayed.  Liver Function Tests: No results for input(s): AST, ALT, ALKPHOS, BILITOT, PROT, ALBUMIN in the last 168 hours.  CBC:  Recent Labs Lab 11/28/16 0502  WBC 4.0  HGB 10.6*  HCT 32.5*  MCV 96.2  PLT 125*    Cardiac Enzymes: No results for input(s): CKTOTAL, CKMB, CKMBINDEX, TROPONINI in the last 168 hours.  BNP: No results for input(s): PROBNP in the last 8760 hours.  Coagulation: No results for input(s): INR in the last 168 hours.  ECG:   Medications:     Scheduled Medications: . allopurinol  300 mg Oral QHS  . ammonium lactate   Topical Daily  . aspirin  325 mg Oral Daily  . bisacodyl  10 mg Rectal Once  . enoxaparin (LOVENOX) injection  60 mg Subcutaneous Q24H  . ferrous sulfate  325 mg Oral QPM  . fluconazole  100 mg Oral Daily  . furosemide  80 mg Intravenous BID  . hydrALAZINE  12.5 mg Oral BID  . HYDROcodone-acetaminophen  1 tablet Oral TID  . isosorbide mononitrate  30 mg Oral Daily  . nystatin   Topical TID  . nystatin cream   Topical TID  . pantoprazole  40 mg Oral Daily  . potassium chloride SA  20 mEq Oral BID  . senna-docusate  2 tablet Oral BID  . vitamin B-12  500 mcg Oral Daily     Infusions:   PRN Medications:  polyvinyl alcohol, simethicone  Assessment/Plan    1. Acute on chronic combined systolic/diastolic HF - echo 2/50/87 LVEF 35%, cannot eval diastoilc function, mild AS, mild MR,  - negative 3liters yesterday though I/Os are incomplete, total negative around 19.2liters since admission. She is on lasix IV 70m bid. She has received one 542mdose of metolazone this admission. Mild uptrend in Cr iniitally though stable over the last few days, renal function remains within her previous historical range - medical therapy with hydralazine 12.65m76mid, imdur 30. No ACE/ARB/aldactone due to poor renal function. Patient has refused beta blocker due to reported intolerance.  - CXR  with continued pulmonary edema, right sided effusion - we will continue IV diuretics today. She continues to respond very well to diuretics and remains hypervolemic.  - was on lasix 49m73md at home. Will need to change to torsemide 40mg28m at discharge.   2. Chronic afib - refuses anticoagulation, remains on ASA - has not required av nodal agent  3. CKD III-IV - renal function stable over last few days with diuresis.        [name] [name]

## 2016-11-30 NOTE — Progress Notes (Signed)
Physical Therapy Treatment Patient Details Name: [name] [name] MRN: [id number] DOB: [birth year] Today's Date: 11/30/2016    History of Present Illness 71 year old woman admitted to the hospital on 2/27 due to shortness of breath. Found to have acute on chronic combined CHF and admitted for further evaluation and management.    PT Comments    Pt received sitting up in the chair, and was agreeable to PT tx.  Pt expressed discomfort due to new wound bleeding under skin folds on L back.  Lattie Haw, RN notified of this and bandage requested.  Pt was able to increase gait to 18f with RW, and she was able to participate in LE exercises.  Pt continues to be limited due to chronic pain.  Continue to recommend HHPT upon d/c.      Follow Up Recommendations  Home health PT;Supervision/Assistance - 24 hour     Equipment Recommendations  None recommended by PT    Recommendations for Other Services       Precautions / Restrictions Precautions Precautions: Fall Precaution Comments: Due to immobility.  Restrictions Weight Bearing Restrictions: No    Mobility  Bed Mobility                  Transfers   Equipment used: Rolling walker (2 wheeled) Transfers: Sit to/from Stand Sit to Stand: Supervision            Ambulation/Gait Ambulation/Gait assistance: Supervision Ambulation Distance (Feet): 5 Feet Assistive device: Rolling walker (2 wheeled) Gait Pattern/deviations: Decreased step length - right;Decreased step length - left;Antalgic;Wide base of support     General Gait Details: Pt was able to take several steps forward from her recliner to the wall, and back.  Pt took 1 standing rest break where she tri-poded with forearms resting on the RW.  However, she states she can't stand in one position because her LE's will give out.    Stairs            Wheelchair Mobility    Modified Rankin (Stroke Patients Only)       Balance Overall balance assessment: Needs  assistance Sitting-balance support: Bilateral upper extremity supported;Feet supported Sitting balance-Leahy Scale: Good     Standing balance support: Bilateral upper extremity supported Standing balance-Leahy Scale: Fair                      Cognition Arousal/Alertness: Awake/alert Behavior During Therapy: WFL for tasks assessed/performed Overall Cognitive Status: Within Functional Limits for tasks assessed                      Exercises Total Joint Exercises Gluteal Sets: Strengthening;Both;10 reps;Seated Long Arc Quad: Limitations Long ACSX CorporationLimitations: Attempted, however pt expressed that this exercise is too excruciating to perform. General Exercises - Lower Extremity Hip ABduction/ADduction: Strengthening;Both;10 reps;Seated;Other (comment) (with blanket rolled in between knees. ) Hip Flexion/Marching: Strengthening;Both;10 reps;Seated    General Comments        Pertinent Vitals/Pain Pain Assessment: 0-10 Pain Score: 4  Pain Location: Pt states she has pain all over - chronic - no new pain.  Pain Descriptors / Indicators: Grimacing Pain Intervention(s): Limited activity within patient's tolerance;Monitored during session;Repositioned    Home Living                      Prior Function            PT Goals (current goals can now be found in the care  plan section) Acute Rehab PT Goals Patient Stated Goal: to go home PT Goal Formulation: With patient/family Time For Goal Achievement: 12/06/16 Potential to Achieve Goals: Fair Progress towards PT goals: Progressing toward goals    Frequency    Min 3X/week      PT Plan Discharge plan needs to be updated    Co-evaluation             End of Session   Activity Tolerance: Patient limited by fatigue;Patient limited by pain Patient left: in chair;with call bell/phone within reach Nurse Communication: Mobility status PT Visit Diagnosis: Other abnormalities of gait and mobility  (R26.89);Muscle weakness (generalized) (M62.81)     Time: 8527-7824 PT Time Calculation (min) (ACUTE ONLY): 20 min  Charges:  $Therapeutic Exercise: 8-22 mins                    G Codes:       Beth Dickie Labarre, PT, DPT X: [phone]

## 2016-12-01 DIAGNOSIS — I5021 Acute systolic (congestive) heart failure: Secondary | ICD-10-CM

## 2016-12-01 LAB — BASIC METABOLIC PANEL
Anion gap: 9 (ref 5–15)
BUN: 58 mg/dL — AB (ref 6–20)
CO2: 34 mmol/L — ABNORMAL HIGH (ref 22–32)
CREATININE: 2 mg/dL — AB (ref 0.44–1.00)
Calcium: 9.4 mg/dL (ref 8.9–10.3)
Chloride: 94 mmol/L — ABNORMAL LOW (ref 101–111)
GFR calc Af Amer: 28 mL/min — ABNORMAL LOW (ref >60)
GFR calc non Af Amer: 24 mL/min — ABNORMAL LOW (ref >60)
Glucose, Bld: 89 mg/dL (ref 65–99)
POTASSIUM: 3.9 mmol/L (ref 3.5–5.1)
SODIUM: 137 mmol/L (ref 135–145)

## 2016-12-01 LAB — URINALYSIS, ROUTINE W REFLEX MICROSCOPIC
BILIRUBIN URINE: NEGATIVE
GLUCOSE, UA: NEGATIVE mg/dL
KETONES UR: NEGATIVE mg/dL
NITRITE: NEGATIVE
PH: 6 (ref 5.0–8.0)
PROTEIN: NEGATIVE mg/dL
RENAL EPITHELIAL (UACOMP): 4
Specific Gravity, Urine: 1.009 (ref 1.005–1.030)

## 2016-12-01 NOTE — Progress Notes (Signed)
Progress Note  Patient Name: [name] [name] Date of Encounter: 12/01/2016  Primary Cardiologist: Dr. Dorris Carnes EP: Dr. Cristopher Peru  Subjective   Sitting in chair. Complains of lower back pain, states that her urine has been malodorous. Appetite stable. No chest pain.  Inpatient Medications    Scheduled Meds: . allopurinol  300 mg Oral QHS  . ammonium lactate   Topical Daily  . aspirin  325 mg Oral Daily  . bisacodyl  10 mg Rectal Once  . enoxaparin (LOVENOX) injection  60 mg Subcutaneous Q24H  . ferrous sulfate  325 mg Oral QPM  . fluconazole  100 mg Oral Daily  . furosemide  80 mg Intravenous BID  . hydrALAZINE  12.5 mg Oral BID  . HYDROcodone-acetaminophen  1 tablet Oral TID  . isosorbide mononitrate  30 mg Oral Daily  . nystatin   Topical TID  . nystatin cream   Topical TID  . pantoprazole  40 mg Oral Daily  . potassium chloride SA  20 mEq Oral BID  . senna-docusate  2 tablet Oral BID  . vitamin B-12  500 mcg Oral Daily    PRN Meds: polyvinyl alcohol, simethicone   Vital Signs    Vitals:   11/30/16 1407 11/30/16 2031 11/30/16 2100 12/01/16 0500  BP: (!) 102/51  108/62 (!) 123/56  Pulse: 94  98 (!) 104  Resp: 18  18 18   Temp: 97.8 F (36.6 C)  98.1 F (36.7 C) 98.5 F (36.9 C)  TempSrc: Oral  Oral Oral  SpO2: 95% 95% 95% 93%  Weight:    269 lb 10 oz (122.3 kg)  Height:        Intake/Output Summary (Last 24 hours) at 12/01/16 0827 Last data filed at 12/01/16 0500  Gross per 24 hour  Intake              240 ml  Output             2800 ml  Net            -2560 ml   Filed Weights   11/29/16 0500 11/30/16 0313 12/01/16 0500  Weight: 269 lb 13.5 oz (122.4 kg) 268 lb 8.3 oz (121.8 kg) 269 lb 10 oz (122.3 kg)    Physical Exam   GEN: Morbidly obese, no acute distress.   Neck: No JVD Cardiac:  Irregularly irregular, no murmurs or gallops. Respiratory: Clear to auscultation bilaterally. GI:  Obese with pannus, non-distended  MS:  Chronic appearing  leg edema and venous stasis; No deformity.   Labs    Chemistry Recent Labs Lab 11/29/16 0540 11/30/16 0537 12/01/16 0542  NA 139 137 137  K 3.9 3.9 3.9  CL 94* 93* 94*  CO2 37* 36* 34*  GLUCOSE 85 81 89  BUN 52* 51* 58*  CREATININE 2.00* 1.99* 2.00*  CALCIUM 9.3 9.4 9.4  GFRNONAA 24* 24* 24*  GFRAA 28* 28* 28*  ANIONGAP 8 8 9      Hematology Recent Labs Lab 11/28/16 0502  WBC 4.0  RBC 3.38*  HGB 10.6*  HCT 32.5*  MCV 96.2  MCH 31.4  MCHC 32.6  RDW 15.0  PLT 125*    Radiology    Abdominal films 11/29/2016: FINDINGS: Scattered large and small bowel gas is noted. The cecum is decompressed and of normal caliber. No free air is seen. Right-sided pleural effusion is noted.  IMPRESSION: Right pleural effusion.  No abdominal abnormality noted.  Cardiac Studies   Echocardiogram  11/22/2016: Study Conclusions  - Procedure narrative: Transthoracic echocardiography. Image   quality was adequate. The study was technically difficult, as a   result of body habitus. - Left ventricle: The cavity size was normal. Wall thickness was   increased in a pattern of mild LVH. The estimated ejection   fraction was 35%. There is hypokinesis of the   mid-apicalanteroseptal and apical myocardium. The study is not   technically sufficient to allow evaluation of LV diastolic   function. - Aortic valve: Trileaflet; moderately calcified leaflets. There   was mild stenosis. - Mitral valve: Calcified annulus. Mildly thickened leaflets .   There was mild regurgitation. - Left atrium: The atrium was severely dilated. - Right ventricle: Pacer wire or catheter noted in right ventricle. - Right atrium: The atrium was mildly dilated. Central venous   pressure (est): 15 mm Hg. - Atrial septum: No defect or patent foramen ovale was identified. - Tricuspid valve: There was mild regurgitation. - Pulmonary arteries: PA peak pressure: 40 mm Hg (S). - Pericardium, extracardiac: A small  pericardial effusion was   identified posterior to the heart. There was a right pleural   effusion.  Impressions:  - Mild LVH with LVEF approximately 35%. Hypokinesis of the mid to   distal anteroseptal wall and periapical region noted.   Indeterminate diastolic function. Severe left atrial enlargement.   Mildly calcified mitral annulus with thickened leaflets and mild   mitral regurgitation. Mild calcific aortic stenosis. Device wire   noted within the right heart.. Mild tricuspid regurgitation with   PASP estimated 40 mmHg. Small posterior pericardial effusion and   right pleural effusion noted.  Patient Profile     71 y.o. female withhistory of nonischemic cardiomyopathy, LVEF 35% at this pont, atrial fibrillation status post AV node ablation with pacemaker in place. She follows with Dr. Harrington Challenger and Dr. Lovena Le. She Is being managed for combined heart failure symptoms with substantial volume overload. She has responded well to IV Lasix, weight is down approximately 30 pounds compared to presentation. She has CKD stage 3 and creatinine has been relatively stable in the 1.8-2.0 range.  Assessment & Plan    1. 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 She continues to diurese well on IV Lasix with weight loss of approximately 30 pounds since admission and degree of renal dysfunction remaining relatively stable. Not entirely clear what her true dry weight is, but she may be nearing it at least based on some prior outpatient weights.  2. Chronic atrial fibrillation. She has a history of AV node ablation with pacemaker in place. She declines anticoagulation. CHADSVASC score is 4. She is on aspirin at this time.  3. Nonischemic cardiomyopathy with most recent LVEF approximately 35%. She is not on ACE inhibitor or ARB due to renal dysfunction, currently on hydralazine and nitrate combination. She also refuses beta blocker therapy citing prior intolerances..  4. Essential  hypertension.  5. CKD stage 3, creatinine 2.0. Has remained relatively stable.  6. Reporting malodorous urine, also back pain.  Discussed with patient and family member. From a cardiac perspective would continue with IV Lasix for now, although she may be nearing dry weight at least based on perceived weight gain at presentation over prior baseline. Continue aspirin, hydralazine, and Imdur. Check BMET in a.m. Consider change to oral Demadex for outpatient diuretic, possibly 40-60 mg twice daily. Suggest doing this 24 hours prior to anticipated discharge to assess urine output affect. Would check urinalysis to assess for possible  UTI.  Signed, Rozann Lesches, MD  12/01/2016, 8:27 AM

## 2016-12-01 NOTE — Care Management Important Message (Signed)
Important Message  Patient Details  Name: [name] [name] MRN: [id number] Date of Birth: 05/29/1946   Medicare Important Message Given:  Yes    Sherald Barge, RN 12/01/2016, 12:53 PM

## 2016-12-01 NOTE — Progress Notes (Signed)
PROGRESS NOTE    [name] [name]  IOM:[id number] DOB: December 25, 1945 DOA: 11/21/2016 PCP: [name], MD     Brief Narrative:  71 year old woman admitted to the hospital on 2/27 due to shortness of breath. Found to have acute on chronic combined CHF and admitted for further evaluation and management.   Assessment & Plan:   Principal Problem:   Acute CHF (congestive heart failure) (HCC) Active Problems:   OSA (obstructive sleep apnea)   Essential hypertension   Atrial fibrillation (HCC)   Chronic combined systolic and diastolic CHF (congestive heart failure) (HCC)   Chronic renal failure   Liver cirrhosis secondary to NASH ([place])   Acute combined CHF -Echo from February 2018 shows an ejection fraction of 35% with hypokinesis of the mid apical anteroseptal and apical myocardium not technically sufficient to allow evaluation of LV diastolic function. -She has diuresed close to 22 L since admission, however remains hypervolemic on exam. -Cardiology is on board and they're recommending continuing IV diuresis for now. -No ACE inhibitor/ARB due to renal function, patient has refused beta blocker due to a reported intolerance. -We'll likely need torsemide 40-60 mg twice daily on discharge.  Chronic kidney disease stage 3-4 -Baseline creatinine around 1.8-2. -Renal function remains at baseline.  Chronic atrial fibrillation -On daily aspirin as she refuses anticoagulation therapy. -Has not required an AV nodal agent.  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  Morbid obesity  Liver cirrhosis secondary to [facility] -LFTs are within normal limits.  Candidal skin infection -Continue Diflucan and nystatin powder.  Hypertension -Fair control, continue home medications.  DVT prophylaxis: Lovenox Code Status: Partial code Family Communication: Patient only Disposition Plan: Anticipate discharge home in 48-72  hours  Consultants:   Cardiology  Procedures:   None  Antimicrobials:  Anti-infectives    Start     Dose/Rate Route Frequency Ordered Stop   11/22/16 1300  fluconazole (DIFLUCAN) tablet 100 mg     100 mg Oral Daily 11/22/16 1223         Subjective: Feels well, abdominal pain is improved.  Objective: Vitals:   11/30/16 2031 11/30/16 2100 12/01/16 0500 12/01/16 1345  BP:  108/62 (!) 123/56   Pulse:  98 (!) 104   Resp:  18 18   Temp:  98.1 F (36.7 C) 98.5 F (36.9 C)   TempSrc:  Oral Oral   SpO2: 95% 95% 93% 92%  Weight:   122.3 kg (269 lb 10 oz)   Height:        Intake/Output Summary (Last 24 hours) at 12/01/16 1659 Last data filed at 12/01/16 1300  Gross per 24 hour  Intake              240 ml  Output             2250 ml  Net            -2010 ml   Filed Weights   11/29/16 0500 11/30/16 0313 12/01/16 0500  Weight: 122.4 kg (269 lb 13.5 oz) 121.8 kg (268 lb 8.3 oz) 122.3 kg (269 lb 10 oz)    Examination:  General exam: Alert, awake, oriented x 3,Morbidly obese Respiratory system: Clear to auscultation. Respiratory effort normal. Cardiovascular system:RRR. No murmurs, rubs, gallops. Gastrointestinal system: Abdomen is nondistended, soft and nontender. No organomegaly or masses felt. Normal bowel sounds heard. Central nervous system: Alert and oriented. No focal neurological deficits. Extremities: No C/C/E, +pedal pulses Skin: No rashes, lesions  or ulcers Psychiatry: Judgement and insight appear normal. Mood & affect appropriate.     Data Reviewed: I have personally reviewed following labs and imaging studies  CBC:  Recent Labs Lab 11/28/16 0502  WBC 4.0  HGB 10.6*  HCT 32.5*  MCV 96.2  PLT 917*   Basic Metabolic Panel:  Recent Labs Lab 11/25/16 0637  11/27/16 0502 11/28/16 0502 11/29/16 0540 11/30/16 0537 12/01/16 0542  NA 140  < > 139 139 139 137 137  K 3.9  < > 4.0 4.2 3.9 3.9 3.9  CL 94*  < > 94* 94* 94* 93* 94*  CO2 35*  < > 36*  34* 37* 36* 34*  GLUCOSE 92  < > 82 84 85 81 89  BUN 44*  < > 49* 53* 52* 51* 58*  CREATININE 1.83*  < > 2.02* 2.04* 2.00* 1.99* 2.00*  CALCIUM 9.1  < > 9.1 9.3 9.3 9.4 9.4  MG 2.2  --   --   --   --   --   --   < > = values in this interval not displayed. GFR: Estimated Creatinine Clearance: 34.4 mL/min (by C-G formula based on SCr of 2 mg/dL (H)). Liver Function Tests: No results for input(s): AST, ALT, ALKPHOS, BILITOT, PROT, ALBUMIN in the last 168 hours. No results for input(s): LIPASE, AMYLASE in the last 168 hours. No results for input(s): AMMONIA in the last 168 hours. Coagulation Profile: No results for input(s): INR, PROTIME in the last 168 hours. Cardiac Enzymes: No results for input(s): CKTOTAL, CKMB, CKMBINDEX, TROPONINI in the last 168 hours. BNP (last 3 results) No results for input(s): PROBNP in the last 8760 hours. HbA1C: No results for input(s): HGBA1C in the last 72 hours. CBG: No results for input(s): GLUCAP in the last 168 hours. Lipid Profile: No results for input(s): CHOL, HDL, LDLCALC, TRIG, CHOLHDL, LDLDIRECT in the last 72 hours. Thyroid Function Tests: No results for input(s): TSH, T4TOTAL, FREET4, T3FREE, THYROIDAB in the last 72 hours. Anemia Panel: No results for input(s): VITAMINB12, FOLATE, FERRITIN, TIBC, IRON, RETICCTPCT in the last 72 hours. Urine analysis:    Component Value Date/Time   COLORURINE YELLOW 12/01/2016 1224   APPEARANCEUR CLEAR 12/01/2016 1224   LABSPEC 1.009 12/01/2016 1224   PHURINE 6.0 12/01/2016 1224   GLUCOSEU NEGATIVE 12/01/2016 1224   HGBUR SMALL (A) 12/01/2016 1224   BILIRUBINUR NEGATIVE 12/01/2016 1224   KETONESUR NEGATIVE 12/01/2016 1224   PROTEINUR NEGATIVE 12/01/2016 1224   UROBILINOGEN 0.2 03/06/2013 2035   NITRITE NEGATIVE 12/01/2016 1224   LEUKOCYTESUR LARGE (A) 12/01/2016 1224   Sepsis Labs: @LABRCNTIP (procalcitonin:4,lacticidven:4)  )No results found for this or any previous visit (from the past 240  hour(s)).       Radiology Studies: No results found.      Scheduled Meds: . allopurinol  300 mg Oral QHS  . ammonium lactate   Topical Daily  . aspirin  325 mg Oral Daily  . bisacodyl  10 mg Rectal Once  . enoxaparin (LOVENOX) injection  60 mg Subcutaneous Q24H  . ferrous sulfate  325 mg Oral QPM  . fluconazole  100 mg Oral Daily  . furosemide  80 mg Intravenous BID  . hydrALAZINE  12.5 mg Oral BID  . HYDROcodone-acetaminophen  1 tablet Oral TID  . isosorbide mononitrate  30 mg Oral Daily  . nystatin   Topical TID  . nystatin cream   Topical TID  . pantoprazole  40 mg Oral Daily  . potassium  chloride SA  20 mEq Oral BID  . senna-docusate  2 tablet Oral BID  . vitamin B-12  500 mcg Oral Daily   Continuous Infusions:   LOS: 10 days    Time spent: 25 minutes. Greater than 50% of this time was spent in direct contact with the patient coordinating care.     Lelon Frohlich, MD [facility] Pager [phone]  If 7PM-7AM, please contact night-coverage [email or web address] Password [email or web address] 12/01/2016, 4:59 PM

## 2016-12-02 LAB — BASIC METABOLIC PANEL
Anion gap: 9 (ref 5–15)
BUN: 64 mg/dL — AB (ref 6–20)
CHLORIDE: 96 mmol/L — AB (ref 101–111)
CO2: 33 mmol/L — ABNORMAL HIGH (ref 22–32)
Calcium: 9.2 mg/dL (ref 8.9–10.3)
Creatinine, Ser: 2.12 mg/dL — ABNORMAL HIGH (ref 0.44–1.00)
GFR calc Af Amer: 26 mL/min — ABNORMAL LOW (ref >60)
GFR calc non Af Amer: 22 mL/min — ABNORMAL LOW (ref >60)
GLUCOSE: 84 mg/dL (ref 65–99)
POTASSIUM: 3.9 mmol/L (ref 3.5–5.1)
Sodium: 138 mmol/L (ref 135–145)

## 2016-12-02 LAB — CBC
HCT: 33.8 % — ABNORMAL LOW (ref 36.0–46.0)
HEMOGLOBIN: 11.1 g/dL — AB (ref 12.0–15.0)
MCH: 31.4 pg (ref 26.0–34.0)
MCHC: 32.8 g/dL (ref 30.0–36.0)
MCV: 95.5 fL (ref 78.0–100.0)
Platelets: 154 10*3/uL (ref 150–400)
RBC: 3.54 MIL/uL — AB (ref 3.87–5.11)
RDW: 15 % (ref 11.5–15.5)
WBC: 4.5 10*3/uL (ref 4.0–10.5)

## 2016-12-02 MED ORDER — TORSEMIDE 20 MG PO TABS
40.0000 mg | ORAL_TABLET | Freq: Two times a day (BID) | ORAL | Status: DC
  Administered 2016-12-02 – 2016-12-03 (×2): 40 mg via ORAL
  Filled 2016-12-02 (×2): qty 2

## 2016-12-02 MED ORDER — SULFAMETHOXAZOLE-TRIMETHOPRIM 800-160 MG PO TABS
1.0000 | ORAL_TABLET | Freq: Two times a day (BID) | ORAL | Status: DC
  Administered 2016-12-02 – 2016-12-03 (×2): 1 via ORAL
  Filled 2016-12-02 (×2): qty 1

## 2016-12-02 NOTE — Progress Notes (Signed)
PROGRESS NOTE    [name] [name]  WGY:[id number] DOB: 12-Jul-1946 DOA: 11/21/2016 PCP: [name], MD     Brief Narrative:  71 year old woman admitted to the hospital on 2/27 due to shortness of breath. Found to have acute on chronic combined CHF and admitted for further evaluation and management.   Assessment & Plan:   Principal Problem:   Acute CHF (congestive heart failure) (HCC) Active Problems:   OSA (obstructive sleep apnea)   Essential hypertension   Atrial fibrillation (HCC)   Chronic combined systolic and diastolic CHF (congestive heart failure) (HCC)   Chronic renal failure   Liver cirrhosis secondary to NASH ([place])   Acute combined CHF -Echo from February 2018 shows an ejection fraction of 35% with hypokinesis of the mid apical anteroseptal and apical myocardium not technically sufficient to allow evaluation of LV diastolic function. -She has diuresed cover 24 L since admission, appears to be reaching her euvolemic state as her Cr is starting to bump. -No ACE inhibitor/ARB due to renal function, patient has refused beta blocker due to a reported intolerance. -Will transition over to torsemide 40 mg BID and consider DC in home as long as continues to diurese and renal function has not drastically worsened overnight.  Chronic kidney disease stage 3-4 -Baseline creatinine around 1.8-2. -Renal function slightly above baseline at 2.18 today.  ?UTI -Start bactrim for 5 days pending cx data.  Chronic atrial fibrillation -On daily aspirin as she refuses anticoagulation therapy. -Has not required an AV nodal agent.  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  Morbid obesity  Liver cirrhosis secondary to [facility] -LFTs are within normal limits.  Candidal skin infection -Continue Diflucan and nystatin powder.  Hypertension -Fair control, continue home medications.  DVT prophylaxis: Lovenox Code  Status: Partial code Family Communication: Patient only Disposition Plan: Anticipate discharge home in 48-72 hours  Consultants:   Cardiology  Procedures:   None  Antimicrobials:  Anti-infectives    Start     Dose/Rate Route Frequency Ordered Stop   11/22/16 1300  fluconazole (DIFLUCAN) tablet 100 mg     100 mg Oral Daily 11/22/16 1223         Subjective: Feels well, abdominal pain is improved.  Objective: Vitals:   12/01/16 2151 12/02/16 0617 12/02/16 1040 12/02/16 1413  BP: 108/88 (!) 90/56 (!) 108/56 (!) 113/54  Pulse: 93 98 91 98  Resp: 18 18  18   Temp: 98.4 F (36.9 C) 98 F (36.7 C)  98.5 F (36.9 C)  TempSrc: Oral Oral  Oral  SpO2: 98% 93%  95%  Weight:  119.8 kg (264 lb 1.8 oz)    Height:        Intake/Output Summary (Last 24 hours) at 12/02/16 1625 Last data filed at 12/02/16 1413  Gross per 24 hour  Intake              480 ml  Output             2650 ml  Net            -2170 ml   Filed Weights   11/30/16 0313 12/01/16 0500 12/02/16 0617  Weight: 121.8 kg (268 lb 8.3 oz) 122.3 kg (269 lb 10 oz) 119.8 kg (264 lb 1.8 oz)    Examination:  General exam: Alert, awake, oriented x 3,Morbidly obese Respiratory system: Clear to auscultation. Respiratory effort normal. Cardiovascular system:RRR. No murmurs, rubs, gallops. Gastrointestinal system: Abdomen is nondistended, soft  and nontender. No organomegaly or masses felt. Normal bowel sounds heard. Central nervous system: Alert and oriented. No focal neurological deficits. Extremities: No C/C/E, +pedal pulses Skin: No rashes, lesions or ulcers Psychiatry: Judgement and insight appear normal. Mood & affect appropriate.     Data Reviewed: I have personally reviewed following labs and imaging studies  CBC:  Recent Labs Lab 11/28/16 0502 12/02/16 0555  WBC 4.0 4.5  HGB 10.6* 11.1*  HCT 32.5* 33.8*  MCV 96.2 95.5  PLT 125* 903   Basic Metabolic Panel:  Recent Labs Lab 11/28/16 0502  11/29/16 0540 11/30/16 0537 12/01/16 0542 12/02/16 0555  NA 139 139 137 137 138  K 4.2 3.9 3.9 3.9 3.9  CL 94* 94* 93* 94* 96*  CO2 34* 37* 36* 34* 33*  GLUCOSE 84 85 81 89 84  BUN 53* 52* 51* 58* 64*  CREATININE 2.04* 2.00* 1.99* 2.00* 2.12*  CALCIUM 9.3 9.3 9.4 9.4 9.2   GFR: Estimated Creatinine Clearance: 32.1 mL/min (by C-G formula based on SCr of 2.12 mg/dL (H)). Liver Function Tests: No results for input(s): AST, ALT, ALKPHOS, BILITOT, PROT, ALBUMIN in the last 168 hours. No results for input(s): LIPASE, AMYLASE in the last 168 hours. No results for input(s): AMMONIA in the last 168 hours. Coagulation Profile: No results for input(s): INR, PROTIME in the last 168 hours. Cardiac Enzymes: No results for input(s): CKTOTAL, CKMB, CKMBINDEX, TROPONINI in the last 168 hours. BNP (last 3 results) No results for input(s): PROBNP in the last 8760 hours. HbA1C: No results for input(s): HGBA1C in the last 72 hours. CBG: No results for input(s): GLUCAP in the last 168 hours. Lipid Profile: No results for input(s): CHOL, HDL, LDLCALC, TRIG, CHOLHDL, LDLDIRECT in the last 72 hours. Thyroid Function Tests: No results for input(s): TSH, T4TOTAL, FREET4, T3FREE, THYROIDAB in the last 72 hours. Anemia Panel: No results for input(s): VITAMINB12, FOLATE, FERRITIN, TIBC, IRON, RETICCTPCT in the last 72 hours. Urine analysis:    Component Value Date/Time   COLORURINE YELLOW 12/01/2016 1224   APPEARANCEUR CLEAR 12/01/2016 1224   LABSPEC 1.009 12/01/2016 1224   PHURINE 6.0 12/01/2016 1224   GLUCOSEU NEGATIVE 12/01/2016 1224   HGBUR SMALL (A) 12/01/2016 1224   BILIRUBINUR NEGATIVE 12/01/2016 1224   KETONESUR NEGATIVE 12/01/2016 1224   PROTEINUR NEGATIVE 12/01/2016 1224   UROBILINOGEN 0.2 03/06/2013 2035   NITRITE NEGATIVE 12/01/2016 1224   LEUKOCYTESUR LARGE (A) 12/01/2016 1224   Sepsis Labs: @LABRCNTIP (procalcitonin:4,lacticidven:4)  )No results found for this or any previous  visit (from the past 240 hour(s)).       Radiology Studies: No results found.      Scheduled Meds: . allopurinol  300 mg Oral QHS  . ammonium lactate   Topical Daily  . aspirin  325 mg Oral Daily  . bisacodyl  10 mg Rectal Once  . enoxaparin (LOVENOX) injection  60 mg Subcutaneous Q24H  . ferrous sulfate  325 mg Oral QPM  . fluconazole  100 mg Oral Daily  . hydrALAZINE  12.5 mg Oral BID  . HYDROcodone-acetaminophen  1 tablet Oral TID  . isosorbide mononitrate  30 mg Oral Daily  . nystatin   Topical TID  . nystatin cream   Topical TID  . pantoprazole  40 mg Oral Daily  . potassium chloride SA  20 mEq Oral BID  . senna-docusate  2 tablet Oral BID  . torsemide  40 mg Oral BID  . vitamin B-12  500 mcg Oral Daily   Continuous  Infusions:   LOS: 11 days    Time spent: 25 minutes. Greater than 50% of this time was spent in direct contact with the patient coordinating care.     Lelon Frohlich, MD [facility] Pager [phone]  If 7PM-7AM, please contact night-coverage [email or web address] Password [email or web address] 12/02/2016, 4:25 PM

## 2016-12-02 NOTE — Progress Notes (Signed)
Patient transitioned to oral diuretics.  Foley removed at [place], DTV by 0205.

## 2016-12-03 DIAGNOSIS — I4891 Unspecified atrial fibrillation: Secondary | ICD-10-CM

## 2016-12-03 LAB — BASIC METABOLIC PANEL
ANION GAP: 10 (ref 5–15)
BUN: 66 mg/dL — ABNORMAL HIGH (ref 6–20)
CO2: 33 mmol/L — ABNORMAL HIGH (ref 22–32)
Calcium: 9.2 mg/dL (ref 8.9–10.3)
Chloride: 95 mmol/L — ABNORMAL LOW (ref 101–111)
Creatinine, Ser: 2.09 mg/dL — ABNORMAL HIGH (ref 0.44–1.00)
GFR, EST AFRICAN AMERICAN: 26 mL/min — AB (ref >60)
GFR, EST NON AFRICAN AMERICAN: 23 mL/min — AB (ref >60)
Glucose, Bld: 80 mg/dL (ref 65–99)
POTASSIUM: 3.8 mmol/L (ref 3.5–5.1)
SODIUM: 138 mmol/L (ref 135–145)

## 2016-12-03 LAB — CBC
HEMATOCRIT: 33 % — AB (ref 36.0–46.0)
HEMOGLOBIN: 10.9 g/dL — AB (ref 12.0–15.0)
MCH: 31.6 pg (ref 26.0–34.0)
MCHC: 33 g/dL (ref 30.0–36.0)
MCV: 95.7 fL (ref 78.0–100.0)
Platelets: 144 10*3/uL — ABNORMAL LOW (ref 150–400)
RBC: 3.45 MIL/uL — ABNORMAL LOW (ref 3.87–5.11)
RDW: 15.2 % (ref 11.5–15.5)
WBC: 4.7 10*3/uL (ref 4.0–10.5)

## 2016-12-03 MED ORDER — SULFAMETHOXAZOLE-TRIMETHOPRIM 800-160 MG PO TABS: 1.0000 | ORAL_TABLET | Freq: Two times a day (BID) | ORAL | 0 refills | Status: DC

## 2016-12-03 MED ORDER — TORSEMIDE 20 MG PO TABS: 40.0000 mg | ORAL_TABLET | Freq: Two times a day (BID) | ORAL | 2 refills | Status: DC

## 2016-12-03 NOTE — Discharge Summary (Signed)
Physician Discharge Summary  [name] [name] TTS:[id number] DOB: 08-07-1946 DOA: 11/21/2016  PCP: [name], MD  Admit date: 11/21/2016 Discharge date: 12/03/2016  Time spent: 45 minutes  Recommendations for Outpatient Follow-up:  -Will be discharged home today. -Advised to follow up with PCP in 1-2 weeks.   Discharge Diagnoses:  Principal Problem:   Acute CHF (congestive heart failure) (HCC) Active Problems:   OSA (obstructive sleep apnea)   Essential hypertension   Atrial fibrillation (HCC)   Chronic combined systolic and diastolic CHF (congestive heart failure) (HCC)   Chronic renal failure   Liver cirrhosis secondary to NASH [facility])   Discharge Condition: Stable and improved  Filed Weights   12/01/16 0500 12/02/16 0617 12/03/16 0634  Weight: 122.3 kg (269 lb 10 oz) 119.8 kg (264 lb 1.8 oz) 116.7 kg (257 lb 4.4 oz)    History of present illness:  As per Dr. Adair Patter on 2/27: [name] [name] is a 71 y.o. female with medical history significant of atrial fibrillation, complete heart block after AV node ablation, status post permanent pacemaker insertion, HTN, class IIlsystolic/diastolic heart failure, chronic peripheral edema and venous insufficiency that has refused to take any anticoagulation other than aspirin for the past 13 years who presents with shortness of breath that per patient has been going on for the past few months.  She reports she previously spoke to her cardiologist about this and ultimately called her primary care physician but states she did not feel she was heard (of note in the last cardiology office note she was offered metolazone for her heart failure but she declined).  She then states her symptoms truly began over six months ago but have gradually worsened.  She has noticed an increase in her edema in the past three months.  She reports her shortness of breath is so significant that standing up to pivot to the toilet makes her short of breath.  She  reports that she has chronic kidney failure and that she feels as though everything is so intertwined she is worried about trying any new medication because she is worried that it will affect one of her failing organs.  She mentions that she was told only within the past year she has cirrhosis. She has chronic peripheral edema and venous insufficiency.  Last echocardiogram was in February 2016 which showed a left ventricle: The cavity size was normal. There was mildconcentric hypertrophy. Systolic function was severely reduced.The estimated ejection fraction was in the range of 25% to 30%. Diffuse hypokinesis. Doppler parameters are consistent with arestrictive pattern, indicative of decreased left ventriculardiastolic compliance and/or increased left atrial pressure (grade 3 diastolic dysfunction).  Patient denies chest pain, chest pressure, denied pain with respiration.  Denies nausea, vomiting, diarrhea.  Leg pain that is chronic and occasional open wounds on legs that patient states happen when she scratches her legs too much. Reports that she often "runs cold". Uses electric scooter to ambulate and does not walk but can stand and pivot.   ED Course: Patient was seen in the ED and found to be hypoxic on room air (saturation on room air of 87-89%).  She was placed on 2L [place].  She was given 59m IV lasix once.  EKG showed bigeminy, troponin POC 0.02. Cr of 1.93, BNP of 133.0.  CXR showing Cardiac pacer with lead tip over the right ventricle. Congestive heart failure with pulmonary venous congestion and right-sided pleural effusion.  TRH was asked to admited for hypoxia/ acute respiratory failure and  CHF exacerbation.  Hospital Course:   Acute combined CHF -Echo from February 2018 shows an ejection fraction of 35% with hypokinesis of the mid apical anteroseptal and apical myocardium not technically sufficient to allow evaluation of LV diastolic function. -She has diuresed over 25 L since  admission, -No ACE inhibitor/ARB due to renal function, patient has refused beta blocker due to a reported intolerance. -Will transition over to torsemide 40 mg BID.  Chronic kidney disease stage 3-4 -Baseline creatinine around 1.8-2. -Cr at baseline.  ?UTI -Start bactrim for 5 days pending cx data.  Chronic atrial fibrillation -On daily aspirin as she refuses anticoagulation therapy. -Has not required an AV nodal agent.  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  Morbid obesity  Liver cirrhosis secondary to [facility] -LFTs are within normal limits.  Candidal skin infection -Resolved  Hypertension -Fair control, continue home medications.   Procedures:  None   Consultations:  Cardiology  Discharge Instructions  Discharge Instructions    Diet - low sodium heart healthy    Complete by:  As directed    Increase activity slowly    Complete by:  As directed      Allergies as of 12/03/2016      Reactions   Cephalexin Shortness Of Breath   sob   Codeine Anaphylaxis   REACTION: throat swelling   Contrast Media [iodinated Diagnostic Agents] Other (See Comments)   Patient states "I have chronic kidney disease so the doctor said no dye in my veins."   Coreg [carvedilol] Other (See Comments)   Beta Blockers cause her organs to shut down per patient   Peppermint Flavor Shortness Of Breath   Prednisone Anaphylaxis, Shortness Of Breath, Swelling   REACTION: swelling, S.O.B.   Tape Other (See Comments)   States plastic tape blisters her skin   Ciprofloxacin Hives   Latex Itching, Rash   Penicillins Hives      Medication List    STOP taking these medications   furosemide 40 MG tablet Commonly known as:  LASIX     TAKE these medications   allopurinol 300 MG tablet Commonly known as:  ZYLOPRIM Take 300 mg by mouth daily.   aspirin 325 MG tablet Take 325 mg by mouth daily.   calcitRIOL  0.25 MCG capsule Commonly known as:  ROCALTROL Take 0.25 mcg by mouth daily.   COD LIVER OIL PO Take 1 tablet by mouth daily.   docusate sodium 100 MG capsule Commonly known as:  COLACE Take 100 mg by mouth daily as needed for mild constipation.   ferrous sulfate 325 (65 FE) MG tablet Take 325 mg by mouth every evening.   hydrALAZINE 25 MG tablet Commonly known as:  APRESOLINE Take 0.5 tablets (12.5 mg total) by mouth 3 (three) times daily.   HYDROcodone-acetaminophen 10-325 MG tablet Commonly known as:  NORCO Take 1 tablet by mouth 3 (three) times daily.   isosorbide mononitrate 30 MG 24 hr tablet Commonly known as:  IMDUR Take 1 tablet (30 mg total) by mouth daily.   multivitamin tablet Take 1 tablet by mouth daily.   omeprazole 20 MG capsule Commonly known as:  PRILOSEC Take 20 mg by mouth daily.   potassium chloride SA 20 MEQ tablet Commonly known as:  K-DUR,KLOR-CON Take 1 tablet (20 mEq total) by mouth 2 (two) times daily.   sulfamethoxazole-trimethoprim 800-160 MG tablet Commonly known as:  BACTRIM DS,SEPTRA DS Take 1 tablet by mouth every 12 (twelve)  hours.   torsemide 20 MG tablet Commonly known as:  DEMADEX Take 2 tablets (40 mg total) by mouth 2 (two) times daily.   vitamin B-12 500 MCG tablet Commonly known as:  CYANOCOBALAMIN Take 500 mcg by mouth daily.      Allergies  Allergen Reactions  . Cephalexin Shortness Of Breath    sob  . Codeine Anaphylaxis    REACTION: throat swelling  . Contrast Media [Iodinated Diagnostic Agents] Other (See Comments)    Patient states "I have chronic kidney disease so the doctor said no dye in my veins."  . Coreg [Carvedilol] Other (See Comments)    Beta Blockers cause her organs to shut down per patient  . Peppermint Flavor Shortness Of Breath  . Prednisone Anaphylaxis, Shortness Of Breath and Swelling    REACTION: swelling, S.O.B.  . Tape Other (See Comments)    States plastic tape blisters her skin  .  Ciprofloxacin Hives  . Latex Itching and Rash  . Penicillins Hives   Follow-up Information    [facility] Follow up.   Why:  Home health services, RN, OT, PT, aide Contact information: [place], [place]. Schedule an appointment as soon as possible for a visit in 2 week(s).   Specialty:  Family Medicine Contact information: [place] [address] [phone]            The results of significant diagnostics from this hospitalization (including imaging, microbiology, ancillary and laboratory) are listed below for reference.    Significant Diagnostic Studies: Dg Chest 1 View  Result Date: 11/28/2016 CLINICAL DATA:  Shortness of breath. EXAM: CHEST 1 VIEW COMPARISON:  Radiographs of November 21, 2016. FINDINGS: Stable cardiomegaly. Atherosclerosis of thoracic aorta is noted. Stable position of single lead left-sided pacemaker. No pneumothorax is noted. Mild central pulmonary vascular congestion is noted. Stable right basilar opacity is noted consistent with atelectasis or infiltrate with associated pleural effusion. Mild degenerative joint disease of right glenohumeral joint is noted. IMPRESSION: Aortic atherosclerosis. Mild central pulmonary vascular congestion. Stable right basilar atelectasis or infiltrate with associated pleural effusion. Electronically Signed   By: Marijo Conception, M.D.   On: 11/28/2016 09:57   Dg Chest 2 View  Result Date: 11/21/2016 CLINICAL DATA:  Chest tightness. EXAM: CHEST  2 VIEW COMPARISON:  04/05/2016. FINDINGS: Cardiac pacer with lead tip over the right ventricle. Cardiomegaly with pulmonary vascular prominence and bilateral interstitial prominence with right side pleural effusion. Findings consistent with congestive heart failure. No pneumothorax. IMPRESSION: 1. Cardiac pacer with lead tip over the right ventricle. 2. Congestive heart failure with pulmonary venous congestion and right-sided pleural  effusion . Electronically Signed   By: Marcello Moores  Register   On: 11/21/2016 13:52   Dg Shoulder Right Port  Result Date: 11/27/2016 CLINICAL DATA:  Right shoulder pain. EXAM: PORTABLE RIGHT SHOULDER COMPARISON:  None. FINDINGS: There is no evidence of fracture or dislocation. There is slight arthritis of the [facility] joint and of the glenohumeral joint. Slight atelectasis or scarring at the right lung base. Soft tissues are unremarkable. IMPRESSION: No acute abnormality.  Arthritic changes as described. Electronically Signed   By: Lorriane Shire M.D.   On: 11/27/2016 15:14   Dg Abd Portable 2v  Result Date: 11/29/2016 CLINICAL DATA:  Abdominal pain and distension, follow-up cecal distention EXAM: PORTABLE ABDOMEN - 2 VIEW COMPARISON:  11/27/2016 FINDINGS: Scattered large and small bowel gas is noted. The cecum is decompressed and  of normal caliber. No free air is seen. Right-sided pleural effusion is noted. IMPRESSION: Right pleural effusion. No abdominal abnormality noted. Electronically Signed   By: Inez Catalina M.D.   On: 11/29/2016 07:35   Dg Abd Portable 2v  Result Date: 11/27/2016 CLINICAL DATA:  Abdominal pain. EXAM: PORTABLE ABDOMEN - 2 VIEW COMPARISON:  CT scan 04/05/2016 FINDINGS: The lung bases demonstrate cardiac enlargement and bibasilar scarring or atelectasis. A right ventricular pacer wires noted. No findings for small bowel obstruction or free air. Moderate distention of the cecum up to 11 cm. Scoliosis and degenerative changes involving the spine. IMPRESSION: Gaseous distention of the cecum. Could not exclude cecal volvulus, recommend clinical correlation. Plain film follow-up is suggested or CT may be helpful for further evaluation. Electronically Signed   By: Marijo Sanes M.D.   On: 11/27/2016 15:17    Microbiology: Recent Results (from the past 240 hour(s))  Culture, Urine     Status: Abnormal (Preliminary result)   Collection Time: 12/01/16 12:25 PM  Result Value Ref Range Status    Specimen Description URINE, CATHETERIZED  Final   Special Requests NONE  Final   Culture >=100,000 COLONIES/mL ESCHERICHIA COLI (A)  Final   Report Status PENDING  Incomplete     Labs: Basic Metabolic Panel:  Recent Labs Lab 11/29/16 0540 11/30/16 0537 12/01/16 0542 12/02/16 0555 12/03/16 0612  NA 139 137 137 138 138  K 3.9 3.9 3.9 3.9 3.8  CL 94* 93* 94* 96* 95*  CO2 37* 36* 34* 33* 33*  GLUCOSE 85 81 89 84 80  BUN 52* 51* 58* 64* 66*  CREATININE 2.00* 1.99* 2.00* 2.12* 2.09*  CALCIUM 9.3 9.4 9.4 9.2 9.2   Liver Function Tests: No results for input(s): AST, ALT, ALKPHOS, BILITOT, PROT, ALBUMIN in the last 168 hours. No results for input(s): LIPASE, AMYLASE in the last 168 hours. No results for input(s): AMMONIA in the last 168 hours. CBC:  Recent Labs Lab 11/28/16 0502 12/02/16 0555 12/03/16 0612  WBC 4.0 4.5 4.7  HGB 10.6* 11.1* 10.9*  HCT 32.5* 33.8* 33.0*  MCV 96.2 95.5 95.7  PLT 125* 154 144*   Cardiac Enzymes: No results for input(s): CKTOTAL, CKMB, CKMBINDEX, TROPONINI in the last 168 hours. BNP: BNP (last 3 results)  Recent Labs  04/05/16 1535 11/21/16 1307  BNP 151.9* 133.0*    ProBNP (last 3 results) No results for input(s): PROBNP in the last 8760 hours.  CBG: No results for input(s): GLUCAP in the last 168 hours.     SignedLelon Frohlich  [facility] Pager: [phone] 12/03/2016, 5:13 PM

## 2016-12-04 LAB — URINE CULTURE: Culture: >=100000 — AB

## 2016-12-04 NOTE — Care Management (Signed)
Patient discharged 12/03/2016. [place] arranged with [facility]. CM received call that family wants switched to [facility]. Previous orders faxed to [facility] from [place]. CM will fax DC summary to [place].

## 2016-12-06 DIAGNOSIS — K7460 Unspecified cirrhosis of liver: Secondary | ICD-10-CM | POA: Diagnosis not present

## 2016-12-06 DIAGNOSIS — I10 Essential (primary) hypertension: Secondary | ICD-10-CM | POA: Diagnosis not present

## 2016-12-06 DIAGNOSIS — I509 Heart failure, unspecified: Secondary | ICD-10-CM | POA: Diagnosis not present

## 2016-12-06 DIAGNOSIS — M170 Bilateral primary osteoarthritis of knee: Secondary | ICD-10-CM | POA: Diagnosis not present

## 2016-12-06 DIAGNOSIS — N183 Chronic kidney disease, stage 3 (moderate): Secondary | ICD-10-CM | POA: Diagnosis not present

## 2016-12-07 ENCOUNTER — Encounter: Payer: Self-pay | Admitting: *Deleted

## 2016-12-07 ENCOUNTER — Other Ambulatory Visit: Payer: Self-pay | Admitting: *Deleted

## 2016-12-07 DIAGNOSIS — K7460 Unspecified cirrhosis of liver: Secondary | ICD-10-CM | POA: Diagnosis not present

## 2016-12-07 DIAGNOSIS — G4730 Sleep apnea, unspecified: Secondary | ICD-10-CM | POA: Diagnosis not present

## 2016-12-07 DIAGNOSIS — I509 Heart failure, unspecified: Secondary | ICD-10-CM | POA: Diagnosis not present

## 2016-12-07 DIAGNOSIS — R262 Difficulty in walking, not elsewhere classified: Secondary | ICD-10-CM | POA: Diagnosis not present

## 2016-12-07 DIAGNOSIS — M6281 Muscle weakness (generalized): Secondary | ICD-10-CM | POA: Diagnosis not present

## 2016-12-07 DIAGNOSIS — I10 Essential (primary) hypertension: Secondary | ICD-10-CM | POA: Diagnosis not present

## 2016-12-07 NOTE — Patient Outreach (Addendum)
[facility]) Care Management  12/07/2016  CHENELL LOZON [birth year] [id number]  Referral via EMMI-Heart Failure -red dashboard for did not weigh today 12/06/2016:  Telephone call to patient who was advised of reason for call.  HIPPA verification received from patient.  Patient advised that she is unable to weigh at home because she is unable to step  on scales. States she had admission weight of 303 pounds & when discharged weight was 257.6. States she is not having shortness of breath & today's oxygen level is at 97% according to home physical therapist. States level of scales is too high for her to step up so she will not be able to weigh herself currently.  Patient states she is aware of others symptoms of trouble with heart failure and knows to call MD or 911.   Voices that she had hospital follow up appointment with her primary care provider yesterday 12/06/2016. States she is taking medications as instructed & no changes were made. States she was able to get prescriptions filled & medications were delivered to her home. Voices that daughter provides transportation to MD appointments.   Patient voices that she has educational information about heart failure & feels comfortable with her knowledge on managing it. States she has had it for many years.    EMMI-call completed.  Patient wishes to continue with EMMI automatic calls.  Plan: Send to care management assistant to close case.     Sherrin Daisy, RN BSN [place] Management Coordinator [facility] Care Management  [phone]

## 2016-12-11 DIAGNOSIS — K7460 Unspecified cirrhosis of liver: Secondary | ICD-10-CM | POA: Diagnosis not present

## 2016-12-11 DIAGNOSIS — M6281 Muscle weakness (generalized): Secondary | ICD-10-CM | POA: Diagnosis not present

## 2016-12-11 DIAGNOSIS — I10 Essential (primary) hypertension: Secondary | ICD-10-CM | POA: Diagnosis not present

## 2016-12-11 DIAGNOSIS — G4730 Sleep apnea, unspecified: Secondary | ICD-10-CM | POA: Diagnosis not present

## 2016-12-11 DIAGNOSIS — I509 Heart failure, unspecified: Secondary | ICD-10-CM | POA: Diagnosis not present

## 2016-12-11 DIAGNOSIS — R262 Difficulty in walking, not elsewhere classified: Secondary | ICD-10-CM | POA: Diagnosis not present

## 2016-12-12 DIAGNOSIS — N183 Chronic kidney disease, stage 3 (moderate): Secondary | ICD-10-CM | POA: Diagnosis not present

## 2016-12-12 DIAGNOSIS — K7460 Unspecified cirrhosis of liver: Secondary | ICD-10-CM | POA: Diagnosis not present

## 2016-12-12 DIAGNOSIS — M6281 Muscle weakness (generalized): Secondary | ICD-10-CM | POA: Diagnosis not present

## 2016-12-12 DIAGNOSIS — R262 Difficulty in walking, not elsewhere classified: Secondary | ICD-10-CM | POA: Diagnosis not present

## 2016-12-12 DIAGNOSIS — I10 Essential (primary) hypertension: Secondary | ICD-10-CM | POA: Diagnosis not present

## 2016-12-12 DIAGNOSIS — G4730 Sleep apnea, unspecified: Secondary | ICD-10-CM | POA: Diagnosis not present

## 2016-12-12 DIAGNOSIS — I509 Heart failure, unspecified: Secondary | ICD-10-CM | POA: Diagnosis not present

## 2016-12-12 NOTE — Telephone Encounter (Signed)
This encounter was created in error - please disregard.

## 2016-12-13 DIAGNOSIS — I10 Essential (primary) hypertension: Secondary | ICD-10-CM | POA: Diagnosis not present

## 2016-12-13 DIAGNOSIS — G4730 Sleep apnea, unspecified: Secondary | ICD-10-CM | POA: Diagnosis not present

## 2016-12-13 DIAGNOSIS — M6281 Muscle weakness (generalized): Secondary | ICD-10-CM | POA: Diagnosis not present

## 2016-12-13 DIAGNOSIS — I509 Heart failure, unspecified: Secondary | ICD-10-CM | POA: Diagnosis not present

## 2016-12-13 DIAGNOSIS — R262 Difficulty in walking, not elsewhere classified: Secondary | ICD-10-CM | POA: Diagnosis not present

## 2016-12-13 DIAGNOSIS — K7460 Unspecified cirrhosis of liver: Secondary | ICD-10-CM | POA: Diagnosis not present

## 2016-12-14 ENCOUNTER — Other Ambulatory Visit: Payer: Self-pay | Admitting: *Deleted

## 2016-12-14 DIAGNOSIS — I509 Heart failure, unspecified: Secondary | ICD-10-CM | POA: Diagnosis not present

## 2016-12-14 DIAGNOSIS — I10 Essential (primary) hypertension: Secondary | ICD-10-CM | POA: Diagnosis not present

## 2016-12-14 DIAGNOSIS — K7460 Unspecified cirrhosis of liver: Secondary | ICD-10-CM | POA: Diagnosis not present

## 2016-12-14 DIAGNOSIS — M6281 Muscle weakness (generalized): Secondary | ICD-10-CM | POA: Diagnosis not present

## 2016-12-14 DIAGNOSIS — R262 Difficulty in walking, not elsewhere classified: Secondary | ICD-10-CM | POA: Diagnosis not present

## 2016-12-14 DIAGNOSIS — G4730 Sleep apnea, unspecified: Secondary | ICD-10-CM | POA: Diagnosis not present

## 2016-12-14 MED ORDER — HYDRALAZINE HCL 25 MG PO TABS: 12.5000 mg | ORAL_TABLET | Freq: Three times a day (TID) | ORAL | 11 refills | Status: DC

## 2016-12-15 DIAGNOSIS — I10 Essential (primary) hypertension: Secondary | ICD-10-CM | POA: Diagnosis not present

## 2016-12-15 DIAGNOSIS — M6281 Muscle weakness (generalized): Secondary | ICD-10-CM | POA: Diagnosis not present

## 2016-12-15 DIAGNOSIS — R262 Difficulty in walking, not elsewhere classified: Secondary | ICD-10-CM | POA: Diagnosis not present

## 2016-12-15 DIAGNOSIS — I509 Heart failure, unspecified: Secondary | ICD-10-CM | POA: Diagnosis not present

## 2016-12-15 DIAGNOSIS — G4730 Sleep apnea, unspecified: Secondary | ICD-10-CM | POA: Diagnosis not present

## 2016-12-15 DIAGNOSIS — K7460 Unspecified cirrhosis of liver: Secondary | ICD-10-CM | POA: Diagnosis not present

## 2016-12-18 ENCOUNTER — Other Ambulatory Visit: Payer: Self-pay | Admitting: *Deleted

## 2016-12-18 NOTE — Patient Outreach (Signed)
[facility]) Care Management  12/18/2016  TAKELA VARDEN 1946-03-24 [id number]  EMMI-Heart failure referral for red dashboard for weighed themselves.:  Telephone call to patient; left message on voice mail requesting return call.  Plan: Will follow up.  Sherrin Daisy, RN BSN [place] Management Coordinator [facility] Care Management  [phone]

## 2016-12-18 NOTE — Patient Outreach (Signed)
[facility]) Care Management  12/18/2016  [name] [name] 10-05-1945 [id number]   Patient triggered Red on EMMI heart failure dashboard, notification sent to [facility]

## 2016-12-19 DIAGNOSIS — I10 Essential (primary) hypertension: Secondary | ICD-10-CM | POA: Diagnosis not present

## 2016-12-19 DIAGNOSIS — M6281 Muscle weakness (generalized): Secondary | ICD-10-CM | POA: Diagnosis not present

## 2016-12-19 DIAGNOSIS — R262 Difficulty in walking, not elsewhere classified: Secondary | ICD-10-CM | POA: Diagnosis not present

## 2016-12-19 DIAGNOSIS — G4730 Sleep apnea, unspecified: Secondary | ICD-10-CM | POA: Diagnosis not present

## 2016-12-19 DIAGNOSIS — I509 Heart failure, unspecified: Secondary | ICD-10-CM | POA: Diagnosis not present

## 2016-12-19 DIAGNOSIS — K7460 Unspecified cirrhosis of liver: Secondary | ICD-10-CM | POA: Diagnosis not present

## 2016-12-20 ENCOUNTER — Other Ambulatory Visit: Payer: Self-pay | Admitting: *Deleted

## 2016-12-20 DIAGNOSIS — G4730 Sleep apnea, unspecified: Secondary | ICD-10-CM | POA: Diagnosis not present

## 2016-12-20 DIAGNOSIS — K7460 Unspecified cirrhosis of liver: Secondary | ICD-10-CM | POA: Diagnosis not present

## 2016-12-20 DIAGNOSIS — R262 Difficulty in walking, not elsewhere classified: Secondary | ICD-10-CM | POA: Diagnosis not present

## 2016-12-20 DIAGNOSIS — I509 Heart failure, unspecified: Secondary | ICD-10-CM | POA: Diagnosis not present

## 2016-12-20 DIAGNOSIS — M6281 Muscle weakness (generalized): Secondary | ICD-10-CM | POA: Diagnosis not present

## 2016-12-20 DIAGNOSIS — I10 Essential (primary) hypertension: Secondary | ICD-10-CM | POA: Diagnosis not present

## 2016-12-21 DIAGNOSIS — R262 Difficulty in walking, not elsewhere classified: Secondary | ICD-10-CM | POA: Diagnosis not present

## 2016-12-21 DIAGNOSIS — I10 Essential (primary) hypertension: Secondary | ICD-10-CM | POA: Diagnosis not present

## 2016-12-21 DIAGNOSIS — M6281 Muscle weakness (generalized): Secondary | ICD-10-CM | POA: Diagnosis not present

## 2016-12-21 DIAGNOSIS — K7460 Unspecified cirrhosis of liver: Secondary | ICD-10-CM | POA: Diagnosis not present

## 2016-12-21 DIAGNOSIS — I509 Heart failure, unspecified: Secondary | ICD-10-CM | POA: Diagnosis not present

## 2016-12-21 DIAGNOSIS — G4730 Sleep apnea, unspecified: Secondary | ICD-10-CM | POA: Diagnosis not present

## 2016-12-21 NOTE — Patient Outreach (Signed)
[facility] [facility]) Care Management  12/21/2016  [name] [name] September 23, 1946 [id number]  EMMI-Heart Failure referral for red dashboard on Day 11-16 for not weighing:  Spoke with patient who advised that she is unable to weigh herself at home because she has a joint deficiency and cannot not stand up to be weighed. States unable to put weight on that leg so unable to weigh at home.  Voices that she is not having swelling currently and no  trouble breathing. States she knows symptoms of heart failure that she needs to report to MD. Voices that she is  Taking medications as prescribed by her doctor. States daughter provides transportation to MD appointment .  Voices that she would like to have automated calls stopped & that she has had heart failure a long time & feels comfortable with her knowledge of self care . States does not need any further educational literature & thanked Electronics engineer for follow up calls.  States she has no health concerns at this time. Has support from daughter.  Knows when to call MD office and /or 911.   Plan: Advise to stop EMMI calls. Send to care coordinator to close case.  Sherrin Daisy, RN BSN [place] Management Coordinator [facility] Care Management  [phone]

## 2016-12-22 DIAGNOSIS — K7460 Unspecified cirrhosis of liver: Secondary | ICD-10-CM | POA: Diagnosis not present

## 2016-12-22 DIAGNOSIS — M6281 Muscle weakness (generalized): Secondary | ICD-10-CM | POA: Diagnosis not present

## 2016-12-22 DIAGNOSIS — R262 Difficulty in walking, not elsewhere classified: Secondary | ICD-10-CM | POA: Diagnosis not present

## 2016-12-22 DIAGNOSIS — I509 Heart failure, unspecified: Secondary | ICD-10-CM | POA: Diagnosis not present

## 2016-12-22 DIAGNOSIS — I10 Essential (primary) hypertension: Secondary | ICD-10-CM | POA: Diagnosis not present

## 2016-12-22 DIAGNOSIS — G4730 Sleep apnea, unspecified: Secondary | ICD-10-CM | POA: Diagnosis not present

## 2016-12-26 ENCOUNTER — Encounter: Payer: Self-pay | Admitting: Gastroenterology

## 2016-12-26 DIAGNOSIS — G4730 Sleep apnea, unspecified: Secondary | ICD-10-CM | POA: Diagnosis not present

## 2016-12-26 DIAGNOSIS — K7460 Unspecified cirrhosis of liver: Secondary | ICD-10-CM | POA: Diagnosis not present

## 2016-12-26 DIAGNOSIS — M6281 Muscle weakness (generalized): Secondary | ICD-10-CM | POA: Diagnosis not present

## 2016-12-26 DIAGNOSIS — I509 Heart failure, unspecified: Secondary | ICD-10-CM | POA: Diagnosis not present

## 2016-12-26 DIAGNOSIS — I10 Essential (primary) hypertension: Secondary | ICD-10-CM | POA: Diagnosis not present

## 2016-12-26 DIAGNOSIS — R262 Difficulty in walking, not elsewhere classified: Secondary | ICD-10-CM | POA: Diagnosis not present

## 2016-12-27 DIAGNOSIS — R262 Difficulty in walking, not elsewhere classified: Secondary | ICD-10-CM | POA: Diagnosis not present

## 2016-12-27 DIAGNOSIS — I509 Heart failure, unspecified: Secondary | ICD-10-CM | POA: Diagnosis not present

## 2016-12-27 DIAGNOSIS — G4730 Sleep apnea, unspecified: Secondary | ICD-10-CM | POA: Diagnosis not present

## 2016-12-27 DIAGNOSIS — K7460 Unspecified cirrhosis of liver: Secondary | ICD-10-CM | POA: Diagnosis not present

## 2016-12-27 DIAGNOSIS — M6281 Muscle weakness (generalized): Secondary | ICD-10-CM | POA: Diagnosis not present

## 2016-12-27 DIAGNOSIS — I10 Essential (primary) hypertension: Secondary | ICD-10-CM | POA: Diagnosis not present

## 2016-12-28 DIAGNOSIS — R262 Difficulty in walking, not elsewhere classified: Secondary | ICD-10-CM | POA: Diagnosis not present

## 2016-12-28 DIAGNOSIS — I10 Essential (primary) hypertension: Secondary | ICD-10-CM | POA: Diagnosis not present

## 2016-12-28 DIAGNOSIS — M6281 Muscle weakness (generalized): Secondary | ICD-10-CM | POA: Diagnosis not present

## 2016-12-28 DIAGNOSIS — I509 Heart failure, unspecified: Secondary | ICD-10-CM | POA: Diagnosis not present

## 2016-12-28 DIAGNOSIS — K7460 Unspecified cirrhosis of liver: Secondary | ICD-10-CM | POA: Diagnosis not present

## 2016-12-28 DIAGNOSIS — G4730 Sleep apnea, unspecified: Secondary | ICD-10-CM | POA: Diagnosis not present

## 2016-12-29 DIAGNOSIS — K7460 Unspecified cirrhosis of liver: Secondary | ICD-10-CM | POA: Diagnosis not present

## 2016-12-29 DIAGNOSIS — M6281 Muscle weakness (generalized): Secondary | ICD-10-CM | POA: Diagnosis not present

## 2016-12-29 DIAGNOSIS — I10 Essential (primary) hypertension: Secondary | ICD-10-CM | POA: Diagnosis not present

## 2016-12-29 DIAGNOSIS — I509 Heart failure, unspecified: Secondary | ICD-10-CM | POA: Diagnosis not present

## 2016-12-29 DIAGNOSIS — R262 Difficulty in walking, not elsewhere classified: Secondary | ICD-10-CM | POA: Diagnosis not present

## 2016-12-29 DIAGNOSIS — G4730 Sleep apnea, unspecified: Secondary | ICD-10-CM | POA: Diagnosis not present

## 2017-01-01 DIAGNOSIS — K7460 Unspecified cirrhosis of liver: Secondary | ICD-10-CM | POA: Diagnosis not present

## 2017-01-01 DIAGNOSIS — G4730 Sleep apnea, unspecified: Secondary | ICD-10-CM | POA: Diagnosis not present

## 2017-01-01 DIAGNOSIS — M6281 Muscle weakness (generalized): Secondary | ICD-10-CM | POA: Diagnosis not present

## 2017-01-01 DIAGNOSIS — R262 Difficulty in walking, not elsewhere classified: Secondary | ICD-10-CM | POA: Diagnosis not present

## 2017-01-01 DIAGNOSIS — I10 Essential (primary) hypertension: Secondary | ICD-10-CM | POA: Diagnosis not present

## 2017-01-01 DIAGNOSIS — I509 Heart failure, unspecified: Secondary | ICD-10-CM | POA: Diagnosis not present

## 2017-01-02 DIAGNOSIS — I509 Heart failure, unspecified: Secondary | ICD-10-CM | POA: Diagnosis not present

## 2017-01-02 DIAGNOSIS — R262 Difficulty in walking, not elsewhere classified: Secondary | ICD-10-CM | POA: Diagnosis not present

## 2017-01-02 DIAGNOSIS — G4730 Sleep apnea, unspecified: Secondary | ICD-10-CM | POA: Diagnosis not present

## 2017-01-02 DIAGNOSIS — M6281 Muscle weakness (generalized): Secondary | ICD-10-CM | POA: Diagnosis not present

## 2017-01-02 DIAGNOSIS — K7460 Unspecified cirrhosis of liver: Secondary | ICD-10-CM | POA: Diagnosis not present

## 2017-01-02 DIAGNOSIS — I10 Essential (primary) hypertension: Secondary | ICD-10-CM | POA: Diagnosis not present

## 2017-01-03 ENCOUNTER — Other Ambulatory Visit: Payer: Self-pay | Admitting: *Deleted

## 2017-01-03 ENCOUNTER — Other Ambulatory Visit: Payer: Self-pay

## 2017-01-03 DIAGNOSIS — M6281 Muscle weakness (generalized): Secondary | ICD-10-CM | POA: Diagnosis not present

## 2017-01-03 DIAGNOSIS — I10 Essential (primary) hypertension: Secondary | ICD-10-CM | POA: Diagnosis not present

## 2017-01-03 DIAGNOSIS — K7460 Unspecified cirrhosis of liver: Secondary | ICD-10-CM | POA: Diagnosis not present

## 2017-01-03 DIAGNOSIS — G4730 Sleep apnea, unspecified: Secondary | ICD-10-CM | POA: Diagnosis not present

## 2017-01-03 DIAGNOSIS — R262 Difficulty in walking, not elsewhere classified: Secondary | ICD-10-CM | POA: Diagnosis not present

## 2017-01-03 DIAGNOSIS — I509 Heart failure, unspecified: Secondary | ICD-10-CM | POA: Diagnosis not present

## 2017-01-03 MED ORDER — TORSEMIDE 20 MG PO TABS: 40.0000 mg | ORAL_TABLET | Freq: Two times a day (BID) | ORAL | 6 refills | Status: DC

## 2017-01-03 NOTE — Telephone Encounter (Signed)
Refilled torsemide per fax request

## 2017-01-22 DIAGNOSIS — Z6841 Body Mass Index (BMI) 40.0 and over, adult: Secondary | ICD-10-CM | POA: Diagnosis not present

## 2017-01-22 DIAGNOSIS — Z950 Presence of cardiac pacemaker: Secondary | ICD-10-CM | POA: Diagnosis not present

## 2017-01-22 DIAGNOSIS — N343 Urethral syndrome, unspecified: Secondary | ICD-10-CM | POA: Diagnosis not present

## 2017-01-22 DIAGNOSIS — R609 Edema, unspecified: Secondary | ICD-10-CM | POA: Diagnosis not present

## 2017-01-22 DIAGNOSIS — N2581 Secondary hyperparathyroidism of renal origin: Secondary | ICD-10-CM | POA: Diagnosis not present

## 2017-01-22 DIAGNOSIS — I129 Hypertensive chronic kidney disease with stage 1 through stage 4 chronic kidney disease, or unspecified chronic kidney disease: Secondary | ICD-10-CM | POA: Diagnosis not present

## 2017-01-22 DIAGNOSIS — N200 Calculus of kidney: Secondary | ICD-10-CM | POA: Diagnosis not present

## 2017-01-22 DIAGNOSIS — D631 Anemia in chronic kidney disease: Secondary | ICD-10-CM | POA: Diagnosis not present

## 2017-01-22 DIAGNOSIS — I480 Paroxysmal atrial fibrillation: Secondary | ICD-10-CM | POA: Diagnosis not present

## 2017-01-22 DIAGNOSIS — M109 Gout, unspecified: Secondary | ICD-10-CM | POA: Diagnosis not present

## 2017-01-22 DIAGNOSIS — N183 Chronic kidney disease, stage 3 (moderate): Secondary | ICD-10-CM | POA: Diagnosis not present

## 2017-01-23 ENCOUNTER — Telehealth: Payer: Self-pay | Admitting: Gastroenterology

## 2017-01-23 NOTE — Telephone Encounter (Signed)
Recall for ultrasound 

## 2017-01-23 NOTE — Telephone Encounter (Signed)
Letter mailed

## 2017-01-24 DIAGNOSIS — R609 Edema, unspecified: Secondary | ICD-10-CM | POA: Diagnosis not present

## 2017-01-24 DIAGNOSIS — M170 Bilateral primary osteoarthritis of knee: Secondary | ICD-10-CM | POA: Diagnosis not present

## 2017-01-24 DIAGNOSIS — K7460 Unspecified cirrhosis of liver: Secondary | ICD-10-CM | POA: Diagnosis not present

## 2017-01-24 DIAGNOSIS — N183 Chronic kidney disease, stage 3 (moderate): Secondary | ICD-10-CM | POA: Diagnosis not present

## 2017-01-24 DIAGNOSIS — I10 Essential (primary) hypertension: Secondary | ICD-10-CM | POA: Diagnosis not present

## 2017-01-24 DIAGNOSIS — I509 Heart failure, unspecified: Secondary | ICD-10-CM | POA: Diagnosis not present

## 2017-01-29 ENCOUNTER — Ambulatory Visit: Payer: Medicare Other | Admitting: Pulmonary Disease

## 2017-01-30 ENCOUNTER — Encounter: Payer: Self-pay | Admitting: Pulmonary Disease

## 2017-01-30 ENCOUNTER — Ambulatory Visit (INDEPENDENT_AMBULATORY_CARE_PROVIDER_SITE_OTHER)
Admission: RE | Admit: 2017-01-30 | Discharge: 2017-01-30 | Disposition: A | Discharge status: Home / Self Care | Payer: Medicare Other | Source: Ambulatory Visit | Attending: Pulmonary Disease | Admitting: Pulmonary Disease

## 2017-01-30 ENCOUNTER — Ambulatory Visit (INDEPENDENT_AMBULATORY_CARE_PROVIDER_SITE_OTHER): Payer: Medicare Other | Admitting: Pulmonary Disease

## 2017-01-30 ENCOUNTER — Other Ambulatory Visit: Payer: Self-pay | Admitting: Pulmonary Disease

## 2017-01-30 VITALS — BP 130/80 | HR 97 | Ht 66.0 in

## 2017-01-30 DIAGNOSIS — G4733 Obstructive sleep apnea (adult) (pediatric): Secondary | ICD-10-CM

## 2017-01-30 DIAGNOSIS — J90 Pleural effusion, not elsewhere classified: Secondary | ICD-10-CM

## 2017-01-30 NOTE — Progress Notes (Signed)
Spoke with patient and informed her of result and recommendations. She verbalized understanding and did not have any questions. Nothing further is needed.

## 2017-01-30 NOTE — Assessment & Plan Note (Signed)
CPAP supplies will be renewed for a year. BiPAP settings 19/13 seem to be adequate with noticed of events. She has excellent improvement in her daytime fatigue and is very compliant  Weight loss encouraged, compliance with goal of at least 4-6 hrs every night is the expectation. Advised against medications with sedative side effects Cautioned against driving when sleepy - understanding that sleepiness will vary on a day to day basis

## 2017-01-30 NOTE — Progress Notes (Signed)
   Subjective:    Patient ID: [name] [name], female    DOB: March 15, 1946, 71 y.o.   MRN: [id number]  HPI  70/F, morbidly obese ex- smoker for FU of obstructive sleep apnea.  She has been maintained on BiPAP since 1994 Supplier was [facility], now [facility],uses small nasal mask with humidity     01/30/2017  Chief Complaint  Patient presents with  . Follow-up    [facility]A follow up. Bipap (uses [facility] in [place])    Last seen 2016  She was hospitalized for 2 weeks 11/2016 for worsening dyspnea, Escherichia coli UTI, new LV dysfunction, EF was down to 30% cardiac cath was deferred due to CK D with creatinine of 2.0. She was diuresed more than 50 pounds, but unfortunately after discharge has gained about 15 pounds back Chest x-ray from 11/28/16 was reviewed which shows right pleural effusion  She has no regular sleep habits, moves around in a motorised scooter.  uses BIPAP everynight,. denies any problems w/ mask/machine.  mask ok, pressure ok, denies excessive daytime somnolence   This has helped her fatigue. Download shows excellent compliance, no significant residuals with AHI of 4.9 on 19/13 cm  Significant tests/ events Baseline PSG 05/2010 showed AHI 30/h, RDI 86/h & desaturation to 87%.  02/2003 >>wt 360 lbs >> BiPAP was re-titrated to 19/12 to abolish snoring although 15/11 was adequate for events.  CPAP titration 02/17/09 ([facility], Dr Radford Pax) to 13 cm >>  did not tolerate CPAP.   RPt titration 03/2013  showed corrected by BiPAP of 19/13 with a small fullface mask.   Hospitalised 11/2010 Urosepsis with calculi, required PCN, required mech vent x 6ds,    Past Medical History:  Diagnosis Date  . Anemia   . Atrial fibrillation ([place])   . Bursitis   . Cancer ([place])   . Cataract   . Cervical cancer ([place]) 1991   s/p hysterectomy  . Chronic cellulitis   . Chronic systolic congestive heart failure (HCC)    LVEF 25-30% with restrictive diastolic filling  . Degenerative  joint disease   . Diverticulitis   . Essential hypertension   . Gallstones   . GERD (gastroesophageal reflux disease)   . Gout   . Kidney stones   . Morbid obesity ([place])   . Osteoarthritis   . Sinoatrial node dysfunction (HCC)    Status post PPM - Dr. Lovena Le  . Sleep apnea    CPAP    Review of Systems neg for any significant sore throat, dysphagia, itching, sneezing, nasal congestion or excess/ purulent secretions, fever, chills, sweats, unintended wt loss, pleuritic or exertional cp, hempoptysis, orthopnea pnd or change in chronic leg swelling. Also denies presyncope, palpitations, heartburn, abdominal pain, nausea, vomiting, diarrhea or change in bowel or urinary habits, dysuria,hematuria, rash, arthralgias, visual complaints, headache, numbness weakness or ataxia.     Objective:   Physical Exam   Gen. Pleasant, obese, in no distress, normal affect ENT - no lesions, no post nasal drip, class 2-3 airway Neck: No JVD, no thyromegaly, no carotid bruits Lungs: no use of accessory muscles, no dullness to percussion, decreased right without rales or rhonchi  Cardiovascular: Rhythm regular, heart sounds  normal, no murmurs or gallops, no peripheral edema Abdomen: soft and non-tender, no hepatosplenomegaly, BS normal. Musculoskeletal: No deformities, no cyanosis or clubbing Neuro:  alert, non focal, no tremors        Assessment & Plan:

## 2017-01-30 NOTE — Assessment & Plan Note (Signed)
Chest x-ray today to follow-up If worse, may need thoracentesis and she is symptomatic. Likely transudate-a combination of CHF, CK D and cirrhosis

## 2017-01-30 NOTE — Patient Instructions (Signed)
Chest x-ray to follow-up on fluid around right lung  CPAP supplies will be renewed for a year  Call us if breathing worse

## 2017-01-31 ENCOUNTER — Telehealth: Payer: Self-pay

## 2017-01-31 NOTE — Telephone Encounter (Signed)
Pt called to see if she could have her US done in [place]. Please advise

## 2017-02-01 ENCOUNTER — Other Ambulatory Visit: Payer: Self-pay

## 2017-02-01 DIAGNOSIS — K7460 Unspecified cirrhosis of liver: Secondary | ICD-10-CM

## 2017-02-01 NOTE — Telephone Encounter (Signed)
OK TO HAVE [place] IN [place]. NEEDS RUQ U/S, DX: CIRRHOSIS. FAX RESULTS TO [phone].

## 2017-02-01 NOTE — Telephone Encounter (Signed)
Order has been faxed and she is aware

## 2017-02-14 DIAGNOSIS — K7460 Unspecified cirrhosis of liver: Secondary | ICD-10-CM | POA: Diagnosis not present

## 2017-02-15 ENCOUNTER — Telehealth: Payer: Self-pay | Admitting: Internal Medicine

## 2017-02-15 ENCOUNTER — Other Ambulatory Visit: Payer: Self-pay

## 2017-02-15 ENCOUNTER — Telehealth: Payer: Self-pay

## 2017-02-15 ENCOUNTER — Ambulatory Visit (INDEPENDENT_AMBULATORY_CARE_PROVIDER_SITE_OTHER): Payer: Medicare Other | Admitting: Gastroenterology

## 2017-02-15 ENCOUNTER — Ambulatory Visit: Payer: Medicare Other | Admitting: Internal Medicine

## 2017-02-15 ENCOUNTER — Encounter: Payer: Self-pay | Admitting: Gastroenterology

## 2017-02-15 DIAGNOSIS — K7581 Nonalcoholic steatohepatitis (NASH): Secondary | ICD-10-CM | POA: Diagnosis not present

## 2017-02-15 DIAGNOSIS — R1310 Dysphagia, unspecified: Secondary | ICD-10-CM | POA: Insufficient documentation

## 2017-02-15 DIAGNOSIS — K7460 Unspecified cirrhosis of liver: Secondary | ICD-10-CM | POA: Diagnosis not present

## 2017-02-15 DIAGNOSIS — I5042 Chronic combined systolic (congestive) and diastolic (congestive) heart failure: Secondary | ICD-10-CM | POA: Diagnosis not present

## 2017-02-15 NOTE — Telephone Encounter (Signed)
Called, pt unavailable.  Called, spoke with pt's daughter, [name] [name]. Informed Dr. Saunders Revel (DOD) recommended pt been seen today in our office. Offered appt time today at 3:20 PM. [name] [name] stated pt does not drive, and she cannot get off work today to transport pt. [name] [name] stated tomorrow morning will work better. Pt has appt for an vaccine at 9 AM. Pt's appt is in [place]. Pt requested appt time around 10:00 AM. Informed I will check with Dr. Harrington Challenger and nurse to see if 10:00 AM tomorrow is available. Someone will contact pt back today. [name] [name] verbalized understanding and thanked me for calling.

## 2017-02-15 NOTE — Telephone Encounter (Signed)
REVIEWED-TALKED TO NURSE. PT WEIGHED 257 LBS IN MAR 11. SHE WILL CONTACT PT.

## 2017-02-15 NOTE — Progress Notes (Addendum)
Subjective:    Patient ID: [name] [name], female    DOB: 02-Jun-1946, 71 y.o.   MRN: [id number] [name], MD  HPI Was hospitalized for fluid overload. WEIGHT GOING UP AGAIN. HAVING TROUBLE WITH SOB AND CAN'T RAISE HER ARMS ABOVE HER HEAD. COLD ALL THE TIME. APPETITE: OFF AND ON GOOD. NAUSEATED AFTER U/S. BMs: WHEN THEY WANT TO.Q1-4 DAYS. TAKES COLACE. FEELS LIKE HER THROAT FEEL LIKE CHOKING TO DEATH. FEELS LIKE SHE GETS STRANGLED A LOT OF MEDICATION/SALIVA(RARE)/SOLID FOOD. PAIN IN BELLY MOST OF THE TIME: UNDER RIB CAGE(UPPER). BOOST ONCE A DAY AND HAIR STOPPED FALLING OUT.  PT DENIES FEVER, CHILLS, HEMATOCHEZIA, vomiting, melena, diarrhea, CHEST PAIN, CHANGE IN BOWEL IN HABITS, constipation,  OR heartburn or indigestion.  Past Medical History:  Diagnosis Date  . Anemia   . Atrial fibrillation ([place])   . Bursitis   . Cancer ([place])   . Cataract   . Cervical cancer ([place]) 1991   s/p hysterectomy  . Chronic cellulitis   . Chronic systolic congestive heart failure (HCC)    LVEF 25-30% with restrictive diastolic filling  . Degenerative joint disease   . Diverticulitis   . Essential hypertension   . Gallstones   . GERD (gastroesophageal reflux disease)   . Gout   . Kidney stones   . Morbid obesity ([place])   . Osteoarthritis   . Sinoatrial node dysfunction (HCC)    Status post PPM - Dr. Lovena Le  . Sleep apnea    CPAP    Past Surgical History:  Procedure Laterality Date  . ABDOMINAL HYSTERECTOMY  1991  . COLONOSCOPY  1998   one polyp per patient  . EYE SURGERY    . PACEMAKER INSERTION  Nov 2000   St Jude with revision in 2011  . PERCUTANEOUS NEPHROLITHOTOMY  April 2012   Allergies  Allergen Reactions  . Cephalexin Shortness Of Breath    sob  . Codeine Anaphylaxis    REACTION: throat swelling  . Contrast Media [Iodinated Diagnostic Agents] Other (See Comments)    Patient states "I have chronic kidney disease so the doctor said no dye in my veins."  . Coreg  [Carvedilol] Other (See Comments)    Beta Blockers cause her organs to shut down per patient  . Peppermint Flavor Shortness Of Breath  . Prednisone Anaphylaxis, Shortness Of Breath and Swelling    REACTION: swelling, S.O.B.  . Tape Other (See Comments)    States plastic tape blisters her skin  . Ciprofloxacin Hives  . Latex Itching and Rash  . Penicillins Hives    Current Outpatient Prescriptions  Medication Sig Dispense Refill  . allopurinol (ZYLOPRIM) 100 MG tablet Take 100 mg by mouth 2 (two) times daily.    Marland Kitchen aspirin 325 MG tablet Take 325 mg by mouth daily.      . calcitRIOL (ROCALTROL) 0.25 MCG capsule Take 0.25 mcg by mouth daily.    . calcium carbonate (OS-CAL) 600 MG tablet Take 600 mg by mouth daily.    . COD LIVER OIL PO Take 1 tablet by mouth daily.      Marland Kitchen docusate sodium (COLACE) 100 MG capsule Take 100 mg by mouth daily as needed for mild constipation.    . ferrous sulfate 325 (65 FE) MG tablet Take 325 mg by mouth every evening.     . hydrALAZINE (APRESOLINE) 25 MG tablet Take 0.5 tablets (12.5 mg total) by mouth 3 (three) times daily.    Marland Kitchen HYDROcodone-acetaminophen (NORCO) 10-325 MG per  tablet Take 1 tablet by mouth 3 (three) times daily.      . isosorbide mononitrate (IMDUR) 30 MG 24 hr tablet Take 1 tablet (30 mg total) by mouth daily.    . Multiple Vitamin (MULTIVITAMIN) tablet Take 1 tablet by mouth daily.    Marland Kitchen nystatin (MYCOSTATIN/NYSTOP) powder Apply 1 application topically every other day.    Marland Kitchen omeprazole (PRILOSEC) 20 MG capsule Take 20 mg by mouth daily.      . potassium chloride SA (K-DUR,KLOR-CON) 20 MEQ tablet Take 1 tablet (20 mEq total) by mouth 2 (two) times daily.    Marland Kitchen torsemide (DEMADEX) 20 MG tablet Take 2 tablets (40 mg total) by mouth 2 (two) times daily.    . vitamin B-12 (CYANOCOBALAMIN) 500 MCG tablet Take 500 mcg by mouth daily.     Review of Systems PER HPI OTHERWISE ALL SYSTEMS ARE NEGATIVE.    Objective:   Physical Exam  Constitutional:  She is oriented to person, place, and time. She appears well-developed and well-nourished. No distress.  COMPLETES FULL SENTENCES  HENT:  Head: Normocephalic and atraumatic.  Mouth/Throat: Oropharynx is clear and moist. No oropharyngeal exudate.  POOR DENTITION  Eyes: Pupils are equal, round, and reactive to light. No scleral icterus.  Neck: Normal range of motion. Neck supple.  Cardiovascular: Normal rate, regular rhythm and normal heart sounds.   Pulmonary/Chest: Effort normal. No respiratory distress.  DIMINISHED BREATH SOUNDS IN RIGHT LUNG FIELD. FAIR AIR MOVEMENT IN LEFT LUNG FIELD.  Abdominal: Soft. Bowel sounds are normal. She exhibits no distension. There is tenderness. There is no rebound and no guarding.  MILD TTP IN THE EPIGASTRIUM & BUQS.  Musculoskeletal: She exhibits edema (BILATERAL LOWER EXTREMITIES).  Lymphadenopathy:    She has no cervical adenopathy.  Neurological: She is alert and oriented to person, place, and time.  NO  NEW FOCAL DEFICITS  Psychiatric:  SLIGHTLY ANXIOUS MOOD, FLAT AFFECT  Vitals reviewed.     Assessment & Plan:

## 2017-02-15 NOTE — Telephone Encounter (Addendum)
Called, spoke with Tamela Oddi, nurse for Dr. Oneida Alar. Informed pt's weight today 286 lbs, previous weight 257 SOB at rest, can't raise arms above head. Vitals today: BP 131/70 HR 99. Inquired about previous weight. Spoke with Dr. Oneida Alar. He informed pt was 257 lbs at discharge from the hospital on 12/03/16. Informed will forward to MD for recommendation and we will contact pt with recommendation. Dr. Oneida Alar verbalized understanding.

## 2017-02-15 NOTE — Assessment & Plan Note (Addendum)
WELL COMPENSATED DISEASE. LIVER ENZYMES NL IN FEB 2018. MAY 2018: ABD U/S GALLSTONES/CIRRHOSIS/SPLENOMEGALY.  REPEAT HFP IN AUG 2018. FOLLOW UP IN 6 MOS.  RUQ U/S 1 WEEK PRIOR TO NEXT VISIT.

## 2017-02-15 NOTE — Telephone Encounter (Signed)
New Message:    The nurse from Dr Trinda Pascal office needs to talk to Dr Lovena Le now please. Pt is having a lot of problems.

## 2017-02-15 NOTE — Telephone Encounter (Signed)
Dr. Oneida Alar, I called Dr. Tanna Furry office @ [phone] and spoke to [place]. I told her the pt had weight gain from 257 to 286 and she is having SOB and can't raise her arms above her head. She was getting me a nurse but I stayed on line over 16 min and had to get busy with patients here.

## 2017-02-15 NOTE — Assessment & Plan Note (Signed)
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  COMPLETE BARIUM PILL ESOPHAGRAM WITHIN THE NEXT 2 WEEKS. TO PREVENT TROUBLE SWALLOWING PILLS CRUSH MEDS WHEN POSSIBLE. CHECK WITH PHARMACY FOR MEDS THAT CAN BE CRUSHED. FOLLOW A SOFT MECHANICAL DIET.  MEATS SHOULD BE CHOPPED OR GROUND ONLY. DO NOT EAT CHUNKS OF ANYTHING.  HANDOUT GIVEN.  FOLLOW UP IN 6 MOS.

## 2017-02-15 NOTE — Assessment & Plan Note (Addendum)
PT GAINING WEIGHT AND C/O SOB.  CALLED [phone] TO NOTIFY CARDIOLOGY OF WEIGHT GAIN.

## 2017-02-15 NOTE — Telephone Encounter (Signed)
Called, spoke with pt's daughter, Marcie Bal. Informed pt has appt scheduled tomorrow (5/25) at 10:30 AM, arriving at 10:15 AM, with Tommye Standard, PA. Marcie Bal verbalized understand and thanked me for calling.

## 2017-02-15 NOTE — Patient Instructions (Addendum)
COMPLETE BARIUM PILL ESOPHAGRAM WITHIN THE NEXT 2 WEEKS.  IF YOU HAVE TROUBLE SWALLOWING PILLS CRUSH MEDS WHEN POSSIBLE. CHECK WITH PHARMACY FOR MEDS THAT CAN BE CRUSHED.  FOLLOW A SOFT MECHANICAL DIET.  MEATS SHOULD BE CHOPPED OR GROUND ONLY. DO NOT EAT CHUNKS OF ANYTHING. SEE INFO BELOW.  FOLLOW UP IN 6 MOS.   SOFT MECHANICAL DIET This SOFT MECHANICAL DIET is restricted to:   Foods that are moist, soft-textured, and easy to chew and swallow.   Meats that are ground or are minced no larger than one-quarter inch pieces. Meats are moist with gravy or sauce added.   Foods that do not include bread or bread-like textures except soft pancakes, well-moistened with syrup or sauce.   Textures with some chewing ability required.   Casseroles without rice.   Cooked vegetables that are less than half an inch in size and easily mashed with a fork. No cooked corn, peas, broccoli, cauliflower, cabbage, Brussels sprouts, asparagus, or other fibrous, non-tender or rubbery cooked vegetables.   Canned fruit except for pineapple. Fruit must be cut into pieces no larger than half an inch in size.   Foods that do not include nuts, seeds, coconut, or sticky textures.

## 2017-02-16 ENCOUNTER — Ambulatory Visit (INDEPENDENT_AMBULATORY_CARE_PROVIDER_SITE_OTHER): Payer: Medicare Other | Admitting: Physician Assistant

## 2017-02-16 ENCOUNTER — Encounter (HOSPITAL_COMMUNITY): Payer: Self-pay

## 2017-02-16 ENCOUNTER — Inpatient Hospital Stay (HOSPITAL_COMMUNITY)
Admission: AD | Admit: 2017-02-16 | Discharge: 2017-02-22 | DRG: 291 | Disposition: A | Discharge status: Home Health | Payer: Medicare Other | Source: Ambulatory Visit | Attending: Internal Medicine | Admitting: Internal Medicine

## 2017-02-16 VITALS — BP 130/80 | HR 92 | Ht 66.0 in | Wt 286.0 lb

## 2017-02-16 DIAGNOSIS — D631 Anemia in chronic kidney disease: Secondary | ICD-10-CM | POA: Diagnosis present

## 2017-02-16 DIAGNOSIS — K219 Gastro-esophageal reflux disease without esophagitis: Secondary | ICD-10-CM | POA: Diagnosis present

## 2017-02-16 DIAGNOSIS — I4891 Unspecified atrial fibrillation: Secondary | ICD-10-CM | POA: Diagnosis not present

## 2017-02-16 DIAGNOSIS — I509 Heart failure, unspecified: Secondary | ICD-10-CM

## 2017-02-16 DIAGNOSIS — I5043 Acute on chronic combined systolic (congestive) and diastolic (congestive) heart failure: Secondary | ICD-10-CM | POA: Diagnosis not present

## 2017-02-16 DIAGNOSIS — Z7982 Long term (current) use of aspirin: Secondary | ICD-10-CM | POA: Diagnosis not present

## 2017-02-16 DIAGNOSIS — Z91041 Radiographic dye allergy status: Secondary | ICD-10-CM

## 2017-02-16 DIAGNOSIS — Z87891 Personal history of nicotine dependence: Secondary | ICD-10-CM | POA: Diagnosis not present

## 2017-02-16 DIAGNOSIS — G4733 Obstructive sleep apnea (adult) (pediatric): Secondary | ICD-10-CM | POA: Diagnosis present

## 2017-02-16 DIAGNOSIS — I110 Hypertensive heart disease with heart failure: Secondary | ICD-10-CM | POA: Diagnosis not present

## 2017-02-16 DIAGNOSIS — Z91040 Latex allergy status: Secondary | ICD-10-CM

## 2017-02-16 DIAGNOSIS — I429 Cardiomyopathy, unspecified: Secondary | ICD-10-CM | POA: Diagnosis present

## 2017-02-16 DIAGNOSIS — Z6841 Body Mass Index (BMI) 40.0 and over, adult: Secondary | ICD-10-CM | POA: Diagnosis not present

## 2017-02-16 DIAGNOSIS — M1990 Unspecified osteoarthritis, unspecified site: Secondary | ICD-10-CM | POA: Diagnosis present

## 2017-02-16 DIAGNOSIS — R0602 Shortness of breath: Secondary | ICD-10-CM | POA: Diagnosis not present

## 2017-02-16 DIAGNOSIS — I482 Chronic atrial fibrillation: Secondary | ICD-10-CM | POA: Diagnosis present

## 2017-02-16 DIAGNOSIS — Z950 Presence of cardiac pacemaker: Secondary | ICD-10-CM

## 2017-02-16 DIAGNOSIS — R188 Other ascites: Secondary | ICD-10-CM | POA: Diagnosis present

## 2017-02-16 DIAGNOSIS — Z880 Allergy status to penicillin: Secondary | ICD-10-CM

## 2017-02-16 DIAGNOSIS — M109 Gout, unspecified: Secondary | ICD-10-CM | POA: Diagnosis present

## 2017-02-16 DIAGNOSIS — Z8541 Personal history of malignant neoplasm of cervix uteri: Secondary | ICD-10-CM | POA: Diagnosis not present

## 2017-02-16 DIAGNOSIS — I130 Hypertensive heart and chronic kidney disease with heart failure and stage 1 through stage 4 chronic kidney disease, or unspecified chronic kidney disease: Secondary | ICD-10-CM | POA: Diagnosis present

## 2017-02-16 DIAGNOSIS — N1832 Chronic kidney disease, stage 3b: Secondary | ICD-10-CM

## 2017-02-16 DIAGNOSIS — I5031 Acute diastolic (congestive) heart failure: Secondary | ICD-10-CM

## 2017-02-16 DIAGNOSIS — K7460 Unspecified cirrhosis of liver: Secondary | ICD-10-CM | POA: Diagnosis present

## 2017-02-16 DIAGNOSIS — Z79899 Other long term (current) drug therapy: Secondary | ICD-10-CM

## 2017-02-16 DIAGNOSIS — Z888 Allergy status to other drugs, medicaments and biological substances status: Secondary | ICD-10-CM | POA: Diagnosis not present

## 2017-02-16 DIAGNOSIS — I428 Other cardiomyopathies: Secondary | ICD-10-CM | POA: Diagnosis present

## 2017-02-16 DIAGNOSIS — N269 Renal sclerosis, unspecified: Secondary | ICD-10-CM | POA: Diagnosis not present

## 2017-02-16 DIAGNOSIS — N183 Chronic kidney disease, stage 3 (moderate): Secondary | ICD-10-CM | POA: Diagnosis not present

## 2017-02-16 DIAGNOSIS — K760 Fatty (change of) liver, not elsewhere classified: Secondary | ICD-10-CM | POA: Diagnosis present

## 2017-02-16 DIAGNOSIS — I5033 Acute on chronic diastolic (congestive) heart failure: Secondary | ICD-10-CM

## 2017-02-16 DIAGNOSIS — I10 Essential (primary) hypertension: Secondary | ICD-10-CM | POA: Diagnosis not present

## 2017-02-16 DIAGNOSIS — E8770 Fluid overload, unspecified: Secondary | ICD-10-CM | POA: Diagnosis not present

## 2017-02-16 DIAGNOSIS — D649 Anemia, unspecified: Secondary | ICD-10-CM | POA: Diagnosis not present

## 2017-02-16 DIAGNOSIS — I4821 Permanent atrial fibrillation: Secondary | ICD-10-CM | POA: Diagnosis present

## 2017-02-16 DIAGNOSIS — R05 Cough: Secondary | ICD-10-CM | POA: Diagnosis not present

## 2017-02-16 HISTORY — DX: Other cardiomyopathies: I42.8

## 2017-02-16 HISTORY — DX: Presence of cardiac pacemaker: Z95.0

## 2017-02-16 HISTORY — DX: Permanent atrial fibrillation: I48.21

## 2017-02-16 HISTORY — DX: Chronic combined systolic (congestive) and diastolic (congestive) heart failure: I50.42

## 2017-02-16 LAB — COMPREHENSIVE METABOLIC PANEL
ALK PHOS: 71 U/L (ref 38–126)
ALT: 19 U/L (ref 14–54)
AST: 25 U/L (ref 15–41)
Albumin: 4.2 g/dL (ref 3.5–5.0)
Anion gap: 10 (ref 5–15)
BILIRUBIN TOTAL: 0.8 mg/dL (ref 0.3–1.2)
BUN: 62 mg/dL — ABNORMAL HIGH (ref 6–20)
CALCIUM: 9.3 mg/dL (ref 8.9–10.3)
CHLORIDE: 102 mmol/L (ref 101–111)
CO2: 28 mmol/L (ref 22–32)
CREATININE: 1.82 mg/dL — AB (ref 0.44–1.00)
GFR calc Af Amer: 31 mL/min — ABNORMAL LOW (ref >60)
GFR, EST NON AFRICAN AMERICAN: 27 mL/min — AB (ref >60)
Glucose, Bld: 116 mg/dL — ABNORMAL HIGH (ref 65–99)
Potassium: 3.8 mmol/L (ref 3.5–5.1)
Sodium: 140 mmol/L (ref 135–145)
TOTAL PROTEIN: 7 g/dL (ref 6.5–8.1)

## 2017-02-16 LAB — CBC WITH DIFFERENTIAL/PLATELET
Basophils Absolute: 0 K/uL (ref 0.0–0.1)
Basophils Relative: 0 %
Eosinophils Absolute: 0.1 K/uL (ref 0.0–0.7)
Eosinophils Relative: 2 %
HCT: 33.4 % — ABNORMAL LOW (ref 36.0–46.0)
Hemoglobin: 10.6 g/dL — ABNORMAL LOW (ref 12.0–15.0)
Lymphocytes Relative: 13 %
Lymphs Abs: 0.9 K/uL (ref 0.7–4.0)
MCH: 30.5 pg (ref 26.0–34.0)
MCHC: 31.7 g/dL (ref 30.0–36.0)
MCV: 96 fL (ref 78.0–100.0)
Monocytes Absolute: 0.6 K/uL (ref 0.1–1.0)
Monocytes Relative: 9 %
Neutro Abs: 5 K/uL (ref 1.7–7.7)
Neutrophils Relative %: 76 %
Platelets: 124 K/uL — ABNORMAL LOW (ref 150–400)
RBC: 3.48 MIL/uL — ABNORMAL LOW (ref 3.87–5.11)
RDW: 15.7 % — ABNORMAL HIGH (ref 11.5–15.5)
WBC: 6.6 K/uL (ref 4.0–10.5)

## 2017-02-16 LAB — TSH: TSH: 4.274 u[IU]/mL (ref 0.350–4.500)

## 2017-02-16 MED ORDER — HYDROCODONE-ACETAMINOPHEN 10-325 MG PO TABS
1.0000 | ORAL_TABLET | Freq: Three times a day (TID) | ORAL | Status: DC
  Administered 2017-02-16 – 2017-02-22 (×18): 1 via ORAL
  Filled 2017-02-16 (×18): qty 1

## 2017-02-16 MED ORDER — SODIUM CHLORIDE 0.9 % IV SOLN: 250.0000 mL | INTRAVENOUS | Status: DC | PRN

## 2017-02-16 MED ORDER — POTASSIUM CHLORIDE CRYS ER 20 MEQ PO TBCR
20.0000 meq | EXTENDED_RELEASE_TABLET | Freq: Two times a day (BID) | ORAL | Status: DC
  Administered 2017-02-16 – 2017-02-22 (×12): 20 meq via ORAL
  Filled 2017-02-16 (×12): qty 1

## 2017-02-16 MED ORDER — PANTOPRAZOLE SODIUM 40 MG PO TBEC
40.0000 mg | DELAYED_RELEASE_TABLET | Freq: Every day | ORAL | Status: DC
  Administered 2017-02-17 – 2017-02-22 (×6): 40 mg via ORAL
  Filled 2017-02-16 (×6): qty 1

## 2017-02-16 MED ORDER — ALLOPURINOL 100 MG PO TABS
100.0000 mg | ORAL_TABLET | Freq: Two times a day (BID) | ORAL | Status: DC
  Administered 2017-02-17 – 2017-02-22 (×11): 100 mg via ORAL
  Filled 2017-02-16 (×11): qty 1

## 2017-02-16 MED ORDER — SODIUM CHLORIDE 0.9% FLUSH
3.0000 mL | Freq: Two times a day (BID) | INTRAVENOUS | Status: DC
  Administered 2017-02-16 – 2017-02-21 (×11): 3 mL via INTRAVENOUS

## 2017-02-16 MED ORDER — FERROUS SULFATE 325 (65 FE) MG PO TABS
325.0000 mg | ORAL_TABLET | Freq: Every evening | ORAL | Status: DC
  Administered 2017-02-16 – 2017-02-21 (×6): 325 mg via ORAL
  Filled 2017-02-16 (×6): qty 1

## 2017-02-16 MED ORDER — SODIUM CHLORIDE 0.9% FLUSH
3.0000 mL | INTRAVENOUS | Status: DC | PRN
  Administered 2017-02-17: 3 mL via INTRAVENOUS
  Filled 2017-02-16: qty 3

## 2017-02-16 MED ORDER — ACETAMINOPHEN 325 MG PO TABS: 650.0000 mg | ORAL_TABLET | ORAL | Status: DC | PRN

## 2017-02-16 MED ORDER — ONDANSETRON HCL 4 MG/2ML IJ SOLN: 4.0000 mg | Freq: Four times a day (QID) | INTRAMUSCULAR | Status: DC | PRN

## 2017-02-16 MED ORDER — HEPARIN SODIUM (PORCINE) 5000 UNIT/ML IJ SOLN
5000.0000 [IU] | Freq: Three times a day (TID) | INTRAMUSCULAR | Status: DC
  Administered 2017-02-16 – 2017-02-21 (×16): 5000 [IU] via SUBCUTANEOUS
  Filled 2017-02-16 (×18): qty 1

## 2017-02-16 MED ORDER — FUROSEMIDE 10 MG/ML IJ SOLN
80.0000 mg | Freq: Two times a day (BID) | INTRAMUSCULAR | Status: DC
  Administered 2017-02-16 – 2017-02-17 (×2): 80 mg via INTRAVENOUS
  Filled 2017-02-16 (×2): qty 8

## 2017-02-16 MED ORDER — ISOSORBIDE MONONITRATE ER 30 MG PO TB24
30.0000 mg | ORAL_TABLET | Freq: Every day | ORAL | Status: DC
  Administered 2017-02-17 – 2017-02-22 (×6): 30 mg via ORAL
  Filled 2017-02-16 (×6): qty 1

## 2017-02-16 MED ORDER — DOCUSATE SODIUM 100 MG PO CAPS
100.0000 mg | ORAL_CAPSULE | Freq: Every day | ORAL | Status: DC | PRN
  Administered 2017-02-18 – 2017-02-21 (×3): 100 mg via ORAL
  Filled 2017-02-16 (×3): qty 1

## 2017-02-16 MED ORDER — HYDRALAZINE HCL 25 MG PO TABS
12.5000 mg | ORAL_TABLET | Freq: Three times a day (TID) | ORAL | Status: DC
  Administered 2017-02-16 – 2017-02-22 (×16): 12.5 mg via ORAL
  Filled 2017-02-16 (×16): qty 1

## 2017-02-16 MED ORDER — ASPIRIN 325 MG PO TABS
325.0000 mg | ORAL_TABLET | Freq: Every day | ORAL | Status: DC
  Administered 2017-02-17 – 2017-02-22 (×6): 325 mg via ORAL
  Filled 2017-02-16 (×6): qty 1

## 2017-02-16 NOTE — Care Management Note (Signed)
Case Management Note  Patient Details  Name: [name] [name] MRN: [id number] Date of Birth: 1946/02/23  Subjective/Objective:                 Patient from home with children, daughter is a cardiology PA. Patient has DME Wheelchair, Civil engineer, contracting with back, Walker, CPAP. Medication coverage through Medicare. Patient discharged with [facility] in February of this year.  PCP [place]   Action/Plan:  CM will continue to follow. Expected Discharge Date:  02/21/17               Expected Discharge Plan:  [place]  In-House Referral:     Discharge planning Services  CM Consult  Post Acute Care Choice:    Choice offered to:     DME Arranged:    DME Agency:     HH Arranged:    [place] Agency:     Status of Service:  In process, will continue to follow  If discussed at Long Length of Stay Meetings, dates discussed:    Additional Comments:  Carles Collet, RN 02/16/2017, 4:31 PM

## 2017-02-16 NOTE — Progress Notes (Signed)
Cardiology Office Note Date:  02/16/2017  Patient ID:  Keeley, Sussman 08-27-1946, MRN [id number] PCP:  [name], MD  Cardiologist:  Dr. Harrington Challenger Electrophysiologist: Dr. Lovena Le   Chief Complaint: increasing weight, SOB  History of Present Illness: JURNI CESARO is a 71 y.o. 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  She was hospitalized in Feb with CHF exacerbation, (noting she was seen by Dr. Lovena Le prior to admission but refused addition of metolazone), diuresed 30lbs, discharged 12/03/16.  She saw GI yesterday found to be fluid OL and instructed to see cardiology, Dr. Saunders Revel DOD yesterday recommended ASAP appointment and she was offered a same day appt but declined with transportation issues and placed on my schedule for today.  She last saw out pt gen cardiology, Dr. Harrington Challenger in 2016, most recently was her hospital stay in Feb.  She comes today accompanied by her daughter, she has been gaining back weight again she says since the day she went home in March, she is unable to weigh at home but her swelling as steadily worsened and clothes becoming tighter.  No palpitations, near syncope or syncope.  She does not ambulate with severe b/l knee pain, but will take a few steps to transfer this is becoming increasingly difficult with SOB, and even just moving in the chair and adjusting herself makes her SOB.  She says breathing in any recline has become difficult and needs to be seated "absolutely upright" to feel like she can breath adequately.  She mentions a pressure in her epigastrum that has been on/off for a few days as well, does not seem to correlate with anything particularly.  She reports her GI doctor told her she had fluid by her lung seen in an [place].  I have not been able to find this  result.  Device information: SJM single chamber PPM, implanted 08/26/10, lead 08/05/99.  Past Medical History:  Diagnosis Date  . Anemia   . Atrial fibrillation ([place])   . Bursitis   . Cancer ([place])   . Cataract   . Cervical cancer ([place]) 1991   s/p hysterectomy  . Chronic cellulitis   . Chronic systolic congestive heart failure (HCC)    LVEF 25-30% with restrictive diastolic filling  . Degenerative joint disease   . Diverticulitis   . Essential hypertension   . Gallstones   . GERD (gastroesophageal reflux disease)   . Gout   . Kidney stones   . Morbid obesity ([place])   . Osteoarthritis   . Sinoatrial node dysfunction (HCC)    Status post PPM - Dr. Lovena Le  . Sleep apnea    CPAP    Past Surgical History:  Procedure Laterality Date  . ABDOMINAL HYSTERECTOMY  1991  . COLONOSCOPY  1998   one polyp per patient  . EYE SURGERY    . PACEMAKER INSERTION  Nov 2000   St Jude with revision in 2011  . PERCUTANEOUS NEPHROLITHOTOMY  April 2012    Current Outpatient Prescriptions  Medication Sig Dispense Refill  . allopurinol (ZYLOPRIM) 100 MG tablet Take 100 mg by mouth 2 (two) times daily.    Marland Kitchen aspirin 325 MG tablet Take 325 mg by mouth daily.      . calcitRIOL (ROCALTROL) 0.25 MCG capsule Take 0.25 mcg by mouth daily.    . calcium  carbonate (OS-CAL) 600 MG tablet Take 600 mg by mouth daily.    . COD LIVER OIL PO Take 1 tablet by mouth daily.      Marland Kitchen docusate sodium (COLACE) 100 MG capsule Take 100 mg by mouth daily as needed for mild constipation.    . ferrous sulfate 325 (65 FE) MG tablet Take 325 mg by mouth every evening.     . hydrALAZINE (APRESOLINE) 25 MG tablet Take 0.5 tablets (12.5 mg total) by mouth 3 (three) times daily. 45 tablet 11  . HYDROcodone-acetaminophen (NORCO) 10-325 MG per tablet Take 1 tablet by mouth 3 (three) times daily.      . isosorbide mononitrate (IMDUR) 30 MG 24 hr tablet Take 1 tablet (30 mg total) by mouth daily. 90 tablet 3  . Multiple Vitamin  (MULTIVITAMIN) tablet Take 1 tablet by mouth daily.    Marland Kitchen nystatin (MYCOSTATIN/NYSTOP) powder Apply 1 application topically every other day.    Marland Kitchen omeprazole (PRILOSEC) 20 MG capsule Take 20 mg by mouth daily.      . potassium chloride SA (K-DUR,KLOR-CON) 20 MEQ tablet Take 1 tablet (20 mEq total) by mouth 2 (two) times daily. 180 tablet 3  . torsemide (DEMADEX) 20 MG tablet Take 2 tablets (40 mg total) by mouth 2 (two) times daily. 120 tablet 6  . vitamin B-12 (CYANOCOBALAMIN) 500 MCG tablet Take 500 mcg by mouth daily.     No current facility-administered medications for this visit.     Allergies:   Cephalexin; Codeine; Contrast media [iodinated diagnostic agents]; Coreg [carvedilol]; Peppermint flavor; Prednisone; Tape; Ciprofloxacin; Latex; and Penicillins   Social History:  The patient  reports that she quit smoking about 37 years ago. Her smoking use included Cigarettes. She smoked 1.00 pack per day. She has never used smokeless tobacco. She reports that she does not drink alcohol or use drugs.   Family History:  The patient's family history includes Colon cancer in her other; Diabetes in her sister; Heart attack in her father; Stroke in her mother.  ROS:  Please see the history of present illness.    All other systems are reviewed and otherwise negative.   PHYSICAL EXAM:  VS:  BP 130/80   Pulse 92   Ht 5' 6"  (1.676 m)   Wt 286 lb (129.7 kg)   BMI 46.16 kg/m  BMI: Body mass index is 46.16 kg/m. Well nourished, well developed, in no acute distress  HEENT: normocephalic, atraumatic  Neck: no JVD, carotid bruits or masses Cardiac:  RRR (paced); no significant murmurs, no rubs, or gallops Lungs:  Decreased right basel, no wheezing, rhonchi or rales  Abd: soft, nontender MS: no deformity or atrophy Ext: marked edema b/l with erythematous skin changes (some degree of both is reported as chronic) Skin: warm and dry, no rash Neuro:  No gross deficits appreciated Psych: euthymic mood,  full affect  PPM site is stable, no tethering or discomfort   EKG:  Reviewed by myself and w/Dr. Caryl Comes, V paced toady PPM interrogation done today by industry and reviewed by myself: battery and leadmeasurements are good, no device observations, programmed VVIR 90bpm  11/22/16: TTE Study Conclusions - Procedure narrative: Transthoracic echocardiography. Image   quality was adequate. The study was technically difficult, as a   result of body habitus. - Left ventricle: The cavity size was normal. Wall thickness was   increased in a pattern of mild LVH. The estimated ejection   fraction was 35%. There is hypokinesis of the  mid-apicalanteroseptal and apical myocardium. The study is not   technically sufficient to allow evaluation of LV diastolic   function. - Aortic valve: Trileaflet; moderately calcified leaflets. There   was mild stenosis. - Mitral valve: Calcified annulus. Mildly thickened leaflets .   There was mild regurgitation. - Left atrium: The atrium was severely dilated. - Right ventricle: Pacer wire or catheter noted in right ventricle. - Right atrium: The atrium was mildly dilated. Central venous   pressure (est): 15 mm Hg. - Atrial septum: No defect or patent foramen ovale was identified. - Tricuspid valve: There was mild regurgitation. - Pulmonary arteries: PA peak pressure: 40 mm Hg (S). - Pericardium, extracardiac: A small pericardial effusion was   identified posterior to the heart. There was a right pleural   effusion. Impressions: - Mild LVH with LVEF approximately 35%. Hypokinesis of the mid to   distal anteroseptal wall and periapical region noted.   Indeterminate diastolic function. Severe left atrial enlargement.   Mildly calcified mitral annulus with thickened leaflets and mild   mitral regurgitation. Mild calcific aortic stenosis. Device wire   noted within the right heart.. Mild tricuspid regurgitation with   PASP estimated 40 mmHg. Small posterior  pericardial effusion and   right pleural effusion noted.  03/18/15: stress Study Highlights   Nuclear stress EF: 48%.  Defect 1: There is a small defect of moderate severity present in the mid inferior, apical inferior and apex location.  This is a low risk study.  The left ventricular ejection fraction is mildly decreased (45-54%).     Recent Labs: 11/21/2016: ALT 20; B Natriuretic Peptide 133.0 11/25/2016: Magnesium 2.2 12/03/2016: BUN 66; Creatinine, Ser 2.09; Hemoglobin 10.9; Platelets 144; Potassium 3.8; Sodium 138  No results found for requested labs within last 8760 hours.   CrCl cannot be calculated (Patient's most recent lab result is older than the maximum 21 days allowed.).   Wt Readings from Last 3 Encounters:  02/16/17 286 lb (129.7 kg)  02/15/17 286 lb 9.6 oz (130 kg)  12/03/16 257 lb 4.4 oz (116.7 kg)     Other studies reviewed: Additional studies/records reviewed today include: summarized above  ASSESSMENT AND PLAN:  1. Acute on chronic CHF (combined)     The patient is seen with Dr. Caryl Comes, discussed addition of metolazone and close out patient f/u vs admission to the hospital.  The patient is feeling quite poorly, becomes tearful and prefers hospitalization.     Will plan direct addmission  2. Permanent AFib     CHA2DS2Vasc is at least 3, pt had peristently decline a/c  3. AVNode ablation w/PPM     Device function is intact, she is programmed 90bpm, the patient is clear that she has had discussions with Dr. Lovena Le and historically at lower HR has felt terrible and he promised her he would not change the programming      Disposition:Initially thoughts were to try metolazone out patient and if no improvement she would need to go to the hospital, though patient relayed she felt poorly enough that she felt she felt best going to the hospital today Will admit to the hospital, [facility]ac[facility] Pontes, CHF exacerbation, would consult nephrologist to aid in her diuresis, in  review of her record it seems that lasix 27m BID was effective.    SHaywood Lasso PA-C 02/16/2017 11:57 AM     [facility] [address] [place] [phone])  [phone])

## 2017-02-16 NOTE — H&P (Signed)
Cardiology Office Note Date:  02/16/2017  Patient ID:  [name], [name] [birth year], MRN [id number] PCP:  [name] Amel, MD        Cardiologist:  Dr. Harrington Challenger Electrophysiologist: Dr. Lovena Le   Chief Complaint: increasing weight, SOB  History of Present Illness: [name] [name] is a 71 y.o. 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  She was hospitalized in Feb with CHF exacerbation, (noting she was seen by Dr. Lovena Le prior to admission but refused addition of metolazone), diuresed 30lbs, discharged 12/03/16.  She saw GI yesterday found to be fluid OL and instructed to see cardiology, Dr. Saunders Revel DOD yesterday recommended ASAP appointment and she was offered a same day appt but declined with transportation issues and placed on my schedule for today.  She last saw out pt gen cardiology, Dr. Harrington Challenger in 2016, most recently was her hospital stay in Feb.  She comes today accompanied by her daughter, she has been gaining back weight again she says since the day she went home in March, she is unable to weigh at home but her swelling as steadily worsened and clothes becoming tighter.  No palpitations, near syncope or syncope.  She does not ambulate with severe b/l knee pain, but will take a few steps to transfer this is becoming increasingly difficult with SOB, and even just moving in the chair and adjusting herself makes her SOB.  She says breathing in any recline has become difficult and needs to be seated "absolutely upright" to feel like she can breath adequately.  She mentions a pressure in her epigastrum that has been on/off for a few days as well, does not seem to correlate with anything particularly.  She reports her GI doctor told her she had fluid by her lung seen in an [place].  I have not been able to find  this result.  Device information: SJM single chamber PPM, implanted 08/26/10, lead 08/05/99.      Past Medical History:  Diagnosis Date  . Anemia   . Atrial fibrillation ([place])   . Bursitis   . Cancer ([place])   . Cataract   . Cervical cancer ([place]) 1991   s/p hysterectomy  . Chronic cellulitis   . Chronic systolic congestive heart failure (HCC)    LVEF 25-30% with restrictive diastolic filling  . Degenerative joint disease   . Diverticulitis   . Essential hypertension   . Gallstones   . GERD (gastroesophageal reflux disease)   . Gout   . Kidney stones   . Morbid obesity ([place])   . Osteoarthritis   . Sinoatrial node dysfunction (HCC)    Status post PPM - Dr. Lovena Le  . Sleep apnea    CPAP         Past Surgical History:  Procedure Laterality Date  . ABDOMINAL HYSTERECTOMY  1991  . COLONOSCOPY  1998   one polyp per patient  . EYE SURGERY    . PACEMAKER INSERTION  Nov 2000   St Jude with revision in 2011  . PERCUTANEOUS NEPHROLITHOTOMY  April 2012          Current Outpatient Prescriptions  Medication Sig Dispense Refill  . allopurinol (ZYLOPRIM) 100 MG tablet Take 100 mg by mouth 2 (two) times daily.    Marland Kitchen aspirin 325 MG tablet Take 325 mg by mouth daily.      Marland Kitchen  calcitRIOL (ROCALTROL) 0.25 MCG capsule Take 0.25 mcg by mouth daily.    . calcium carbonate (OS-CAL) 600 MG tablet Take 600 mg by mouth daily.    . COD LIVER OIL PO Take 1 tablet by mouth daily.      Marland Kitchen docusate sodium (COLACE) 100 MG capsule Take 100 mg by mouth daily as needed for mild constipation.    . ferrous sulfate 325 (65 FE) MG tablet Take 325 mg by mouth every evening.     . hydrALAZINE (APRESOLINE) 25 MG tablet Take 0.5 tablets (12.5 mg total) by mouth 3 (three) times daily. 45 tablet 11  . HYDROcodone-acetaminophen (NORCO) 10-325 MG per tablet Take 1 tablet by mouth 3 (three) times daily.      . isosorbide mononitrate (IMDUR) 30 MG 24 hr tablet Take 1  tablet (30 mg total) by mouth daily. 90 tablet 3  . Multiple Vitamin (MULTIVITAMIN) tablet Take 1 tablet by mouth daily.    Marland Kitchen nystatin (MYCOSTATIN/NYSTOP) powder Apply 1 application topically every other day.    Marland Kitchen omeprazole (PRILOSEC) 20 MG capsule Take 20 mg by mouth daily.      . potassium chloride SA (K-DUR,KLOR-CON) 20 MEQ tablet Take 1 tablet (20 mEq total) by mouth 2 (two) times daily. 180 tablet 3  . torsemide (DEMADEX) 20 MG tablet Take 2 tablets (40 mg total) by mouth 2 (two) times daily. 120 tablet 6  . vitamin B-12 (CYANOCOBALAMIN) 500 MCG tablet Take 500 mcg by mouth daily.     No current facility-administered medications for this visit.     Allergies:   Cephalexin; Codeine; Contrast media [iodinated diagnostic agents]; Coreg [carvedilol]; Peppermint flavor; Prednisone; Tape; Ciprofloxacin; Latex; and Penicillins   Social History:  The patient  reports that she quit smoking about 37 years ago. Her smoking use included Cigarettes. She smoked 1.00 pack per day. She has never used smokeless tobacco. She reports that she does not drink alcohol or use drugs.   Family History:  The patient's family history includes Colon cancer in her other; Diabetes in her sister; Heart attack in her father; Stroke in her mother.  ROS:  Please see the history of present illness.    All other systems are reviewed and otherwise negative.   PHYSICAL EXAM:  VS:  BP 130/80   Pulse 92   Ht 5' 6"  (1.676 m)   Wt 286 lb (129.7 kg)   BMI 46.16 kg/m  BMI: Body mass index is 46.16 kg/m. Well nourished, well developed, in no acute distress  HEENT: normocephalic, atraumatic  Neck: no JVD, carotid bruits or masses Cardiac:  RRR (paced); no significant murmurs, no rubs, or gallops Lungs:  Decreased right basel, no wheezing, rhonchi or rales  Abd: soft, nontender MS: no deformity or atrophy Ext: marked edema b/l with erythematous skin changes (some degree of both is reported as  chronic) Skin: warm and dry, no rash Neuro:  No gross deficits appreciated Psych: euthymic mood, full affect  PPM site is stable, no tethering or discomfort   EKG:  Reviewed by myself and w/Dr. Caryl Comes, V paced toady PPM interrogation done today by industry and reviewed by myself: battery and leadmeasurements are good, no device observations, programmed VVIR 90bpm  11/22/16: TTE Study Conclusions - Procedure narrative: Transthoracic echocardiography. Image quality was adequate. The study was technically difficult, as a result of body habitus. - Left ventricle: The cavity size was normal. Wall thickness was increased in a pattern of mild LVH. The estimated ejection fraction was  35%. There is hypokinesis of the mid-apicalanteroseptal and apical myocardium. The study is not technically sufficient to allow evaluation of LV diastolic function. - Aortic valve: Trileaflet; moderately calcified leaflets. There was mild stenosis. - Mitral valve: Calcified annulus. Mildly thickened leaflets . There was mild regurgitation. - Left atrium: The atrium was severely dilated. - Right ventricle: Pacer wire or catheter noted in right ventricle. - Right atrium: The atrium was mildly dilated. Central venous pressure (est): 15 mm Hg. - Atrial septum: No defect or patent foramen ovale was identified. - Tricuspid valve: There was mild regurgitation. - Pulmonary arteries: PA peak pressure: 40 mm Hg (S). - Pericardium, extracardiac: A small pericardial effusion was identified posterior to the heart. There was a right pleural effusion. Impressions: - Mild LVH with LVEF approximately 35%. Hypokinesis of the mid to distal anteroseptal wall and periapical region noted. Indeterminate diastolic function. Severe left atrial enlargement. Mildly calcified mitral annulus with thickened leaflets and mild mitral regurgitation. Mild calcific aortic stenosis. Device wire noted  within the right heart.. Mild tricuspid regurgitation with PASP estimated 40 mmHg. Small posterior pericardial effusion and right pleural effusion noted.  03/18/15: stress Study Highlights   Nuclear stress EF: 48%.  Defect 1: There is a small defect of moderate severity present in the mid inferior, apical inferior and apex location.  This is a low risk study.  The left ventricular ejection fraction is mildly decreased (45-54%).     Recent Labs: 11/21/2016: ALT 20; B Natriuretic Peptide 133.0 11/25/2016: Magnesium 2.2 12/03/2016: BUN 66; Creatinine, Ser 2.09; Hemoglobin 10.9; Platelets 144; Potassium 3.8; Sodium 138  No results found for requested labs within last 8760 hours.   CrCl cannot be calculated (Patient's most recent lab result is older than the maximum 21 days allowed.).      Wt Readings from Last 3 Encounters:  02/16/17 286 lb (129.7 kg)  02/15/17 286 lb 9.6 oz (130 kg)  12/03/16 257 lb 4.4 oz (116.7 kg)     Other studies reviewed: Additional studies/records reviewed today include: summarized above  ASSESSMENT AND PLAN:  1. Acute on chronic CHF (combined)     The patient is seen with Dr. Caryl Comes, discussed addition of metolazone and close out patient f/u vs admission to the hospital.  The patient is feeling quite poorly, becomes tearful and prefers hospitalization.     Will plan direct addmission  2. Permanent AFib     CHA2DS2Vasc is at least 3, pt had peristently decline a/c  3. AVNode ablation w/PPM     Device function is intact, she is programmed 90bpm, the patient is clear that she has had discussions with Dr. Lovena Le and historically at lower HR has felt terrible and he promised her he would not change the programming      Disposition:Initially thoughts were to try metolazone out patient and if no improvement she would need to go to the hospital, though patient relayed she felt poorly enough that she felt she felt best going to the hospital  today Will admit to the hospital, Zacarias Pontes, CHF exacerbation, would consult nephrologist to aid in her diuresis, in review of her record it seems that lasix 69m BID was effective.    SHaywood Lasso PA-C 02/16/2017 11:57 AM     [facility] [address] [place] [phone])  [phone](fax)

## 2017-02-16 NOTE — Patient Instructions (Signed)
Special Instructions Listed Below :  YOU HAVE BEEN ADMITTED TO [facility] FOR CHF (CONGESTIVE HEART FAILURE )  TO THE 3 EAST UNIT .

## 2017-02-17 ENCOUNTER — Inpatient Hospital Stay (HOSPITAL_COMMUNITY): Payer: Medicare Other

## 2017-02-17 DIAGNOSIS — I5043 Acute on chronic combined systolic (congestive) and diastolic (congestive) heart failure: Secondary | ICD-10-CM

## 2017-02-17 DIAGNOSIS — Z950 Presence of cardiac pacemaker: Secondary | ICD-10-CM

## 2017-02-17 DIAGNOSIS — I482 Chronic atrial fibrillation: Secondary | ICD-10-CM

## 2017-02-17 LAB — BASIC METABOLIC PANEL
Anion gap: 9 (ref 5–15)
BUN: 56 mg/dL — AB (ref 6–20)
CO2: 31 mmol/L (ref 22–32)
CREATININE: 1.81 mg/dL — AB (ref 0.44–1.00)
Calcium: 9.3 mg/dL (ref 8.9–10.3)
Chloride: 101 mmol/L (ref 101–111)
GFR calc Af Amer: 31 mL/min — ABNORMAL LOW (ref >60)
GFR, EST NON AFRICAN AMERICAN: 27 mL/min — AB (ref >60)
GLUCOSE: 107 mg/dL — AB (ref 65–99)
Potassium: 3.9 mmol/L (ref 3.5–5.1)
SODIUM: 141 mmol/L (ref 135–145)

## 2017-02-17 LAB — POTASSIUM: POTASSIUM: 3.5 mmol/L (ref 3.5–5.1)

## 2017-02-17 MED ORDER — FUROSEMIDE 10 MG/ML IJ SOLN
80.0000 mg | Freq: Three times a day (TID) | INTRAMUSCULAR | Status: DC
  Administered 2017-02-17 – 2017-02-21 (×12): 80 mg via INTRAVENOUS
  Filled 2017-02-17 (×12): qty 8

## 2017-02-17 MED ORDER — METOLAZONE 2.5 MG PO TABS
2.5000 mg | ORAL_TABLET | Freq: Every day | ORAL | Status: DC
  Administered 2017-02-17 – 2017-02-20 (×4): 2.5 mg via ORAL
  Filled 2017-02-17 (×4): qty 1

## 2017-02-17 NOTE — Progress Notes (Addendum)
Patient Name: [name] [name] Date of Encounter: 02/17/2017  Active Problems:   CHF (congestive heart failure) ([place])   Length of Stay: 1  SUBJECTIVE  No improvement yet, she feels SOB.    CURRENT MEDS . allopurinol  100 mg Oral BID  . aspirin  325 mg Oral Daily  . ferrous sulfate  325 mg Oral QPM  . furosemide  80 mg Intravenous BID  . heparin  5,000 Units Subcutaneous Q8H  . hydrALAZINE  12.5 mg Oral TID  . HYDROcodone-acetaminophen  1 tablet Oral TID  . isosorbide mononitrate  30 mg Oral Daily  . pantoprazole  40 mg Oral Daily  . potassium chloride SA  20 mEq Oral BID  . sodium chloride flush  3 mL Intravenous Q12H    OBJECTIVE  Vitals:   02/16/17 1638 02/16/17 2012 02/17/17 0205 02/17/17 0404  BP: 136/69 (!) 156/85 (!) 112/54 139/69  Pulse: (!) 104 99 89 (!) 102  Resp: 18 20  (!) 22  Temp: 97.7 F (36.5 C) 98 F (36.7 C) 97.8 F (36.6 C) 98.2 F (36.8 C)  TempSrc: Oral Oral Oral Oral  SpO2: 98% 93% 97% 94%  Weight:    278 lb 9.6 oz (126.4 kg)  Height:        Intake/Output Summary (Last 24 hours) at 02/17/17 0900 Last data filed at 02/17/17 0858  Gross per 24 hour  Intake              600 ml  Output             1500 ml  Net             -900 ml   Filed Weights   02/16/17 1259 02/17/17 0404  Weight: 281 lb 3.2 oz (127.6 kg) 278 lb 9.6 oz (126.4 kg)    PHYSICAL EXAM  General: Pleasant, in mild distress, SOB Neuro: Alert and oriented X 3. Moves all extremities spontaneously. Psych: Normal affect. HEENT:  Normal  Neck: Supple without bruits or JVD. Lungs:  Resp regular and unlabored, crackles B/L, decreased BS on the right  Heart: RRR no s3, s4, or murmurs. Abdomen: Soft, non-tender, non-distended, BS + x 4.  Extremities: No clubbing, cyanosis, severe LE edema with vesicles. DP/PT/Radials 2+ and equal bilaterally.  Accessory Clinical Findings  CBC  Recent Labs  02/16/17 1339  WBC 6.6  NEUTROABS 5.0  HGB 10.6*  HCT 33.4*  MCV 96.0  PLT  882*   Basic Metabolic Panel  Recent Labs  02/16/17 1339 02/17/17 0406  NA 140 141  K 3.8 3.9  CL 102 101  CO2 28 31  GLUCOSE 116* 107*  BUN 62* 56*  CREATININE 1.82* 1.81*  CALCIUM 9.3 9.3   Liver Function Tests  Recent Labs  02/16/17 1339  AST 25  ALT 19  ALKPHOS 71  BILITOT 0.8  PROT 7.0  ALBUMIN 4.2    Recent Labs  02/16/17 1339  TSH 4.274   Radiology/Studies  Dg Chest 1 View  Result Date: 01/30/2017 CLINICAL DATA:  Right pleural effusion EXAM: CHEST 1 VIEW COMPARISON:  11/28/2016 FINDINGS: The heart is markedly enlarged. Single lead left subclavian pacemaker device and leads are stable and intact. Right pleural effusion is stable. Vascular congestion has increased. No Kerley B lines to suggest interstitial edema. No pneumothorax. IMPRESSION: Stable right pleural effusion. Increasing vascular congestion. Electronically Signed   By: Marybelle Killings M.D.   On: 01/30/2017 13:08   TELE: V paced rhythm  ASSESSMENT AND PLAN  1. Acute on chronic CHF (combined) - at home on Torsemide 40 mg PO BID, started on iv Lasix 80 mg BID, I will increase to Q8H, I will add metolazone 2.5 mg po daily - yesterday 281 lbs, on discharge in March 2018 269 lbs, this am 278 lbs.  2. Permanent AFib CHA2DS2Vasc is at least 3, pt had peristently decline a/c  3. AVNode ablation w/PPM Device function is intact, she is programmed 90bpm, the patient is clear that she has had discussions with Dr. Lovena Le and historically at lower HR has felt terrible and he promised her he would not change the programming  Signed, Ena Dawley MD, [facility] 02/17/2017

## 2017-02-17 NOTE — Progress Notes (Signed)
Pt complained of leg cramps, pt tried mustard and is not helping her, potassium this morning was 3.9 pt on 20 MEq KCL BID. Dr. Kenton Kingfisher notified and ordered to check potassium, order placed.

## 2017-02-17 NOTE — Consult Note (Signed)
Referring Provider: No ref. provider found Primary Care Physician:  [name], MD Primary Nephrologist:  Dr. 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 She was hospitalized in Feb with CHF exacerbation, (noting she was seen by Dr. Lovena Le prior to admission but refused addition of metolazone), diuresed 30lbs, discharged 12/03/16. She was admitted with increased in weight and also dyspnea.  Creatinine about 1.6 - 1.8 baseline  No NSAIDS or ACE/ARB medication  No use of nephrotoxins  Patient appears to be having some diuresis although modest with lasix 28m q 8 hours  CT 7/17  Showed exophytic lesion right kidney and hepatic cirrhosis pattern     Past Medical History:  Diagnosis Date  . Anemia   . Atrial fibrillation ([place]   . Bursitis   . [place]   . Cataract   . [place]ervical cancer ([place]   s/p hysterectomy  . Chronic cellulitis   . Chronic systolic congestive heart failure (HCC)    LVEF 25-30% with restrictive diastolic filling  . Degenerative joint disease   . Diverticulitis   . Essential hypertension   . Gallstones   . GERD (gastroesophageal reflux disease)   . Gout   . Kidney stones   . Morbid obesity ([place]   . Osteoarthritis   . Sinoatrial node dysfunction (HCC)    Status post PPM - Dr. TLovena Le . Sleep apnea    CPAP    Past Surgical History:  Procedure Laterality Date  . ABDOMINAL HYSTERECTOMY  1991  . COLONOSCOPY  1998   one polyp per patient  . EYE SURGERY    . PACEMAKER INSERTION  Nov 2000   [employer] with revision in 2011  . PERCUTANEOUS NEPHROLITHOTOMY  April 2012    Prior to Admission medications   Medication Sig Start Date End Date Taking? Authorizing Provider  allopurinol  (ZYLOPRIM) 100 MG tablet Take 100 mg by mouth 2 (two) times daily.   Yes [provider]  aspirin 325 MG tablet Take 325 mg by mouth daily.     Yes [provider]  calcitRIOL (ROCALTROL) 0.25 MCG capsule Take 0.25 mcg by mouth every other day.    Yes [provider]  calcium carbonate (OS-CAL) 600 MG tablet Take 600 mg by mouth daily.   Yes [provider]  COD LIVER OIL PO Take 1 tablet by mouth daily.     Yes [provider]  docusate sodium (COLACE) 100 MG capsule Take 100 mg by mouth daily as needed for mild constipation.   Yes [provider]  ferrous sulfate 325 (65 FE) MG tablet Take 325 mg by mouth every evening.    Yes [provider]  hydrALAZINE (APRESOLINE) 25 MG tablet Take 0.5 tablets (12.5 mg total) by mouth 3 (three) times daily. 12/14/16  Yes TEvans Lance MD  HYDROcodone-acetaminophen ([facility] 10-325 MG per tablet Take 1 tablet by mouth 2 (two) times daily.    Yes [provider]  isosorbide mononitrate (IMDUR) 30 MG 24 hr tablet Take 1 tablet (30 mg total) by mouth daily. 09/11/16  Yes SLyda JesterM, PA-C  Multiple Vitamin (MULTIVITAMIN) tablet Take 1 tablet by mouth daily.   Yes [provider]  nystatin (MYCOSTATIN/NYSTOP) powder Apply 1 application topically every  other day. 02/05/17  Yes [provider]  omeprazole (PRILOSEC) 20 MG capsule Take 20 mg by mouth daily.     Yes [provider]  potassium chloride SA (K-DUR,KLOR-CON) 20 MEQ tablet Take 1 tablet (20 mEq total) by mouth 2 (two) times daily. 11/22/16  Yes Evans Lance, MD  torsemide (DEMADEX) 20 MG tablet Take 2 tablets (40 mg total) by mouth 2 (two) times daily. 01/03/17  Yes Evans Lance, MD  vitamin B-12 (CYANOCOBALAMIN) 500 MCG tablet Take 500 mcg by mouth daily.   Yes [provider]    Current Facility-Administered Medications  Medication Dose Route Frequency Provider Last Rate Last Dose  .  0.9 %  sodium chloride infusion  250 mL Intravenous PRN Eileen Stanford, PA-C      . acetaminophen (TYLENOL) tablet 650 mg  650 mg Oral Q4H PRN Eileen Stanford, PA-C      . allopurinol (ZYLOPRIM) tablet 100 mg  100 mg Oral BID Eileen Stanford, PA-C   100 mg at 02/17/17 1114  . aspirin tablet 325 mg  325 mg Oral Daily Eileen Stanford, PA-C   325 mg at 02/17/17 1111  . docusate sodium (COLACE) capsule 100 mg  100 mg Oral Daily PRN Eileen Stanford, PA-C      . ferrous sulfate tablet 325 mg  325 mg Oral QPM Eileen Stanford, PA-C   325 mg at 02/16/17 1744  . furosemide (LASIX) injection 80 mg  80 mg Intravenous Q8H Dorothy Spark, MD      . heparin injection 5,000 Units  5,000 Units Subcutaneous Q8H Eileen Stanford, PA-C   5,000 Units at 02/16/17 2205  . hydrALAZINE (APRESOLINE) tablet 12.5 mg  12.5 mg Oral TID Angelena Form R, PA-C   12.5 mg at 02/17/17 1113  . HYDROcodone-acetaminophen (NORCO) 10-325 MG per tablet 1 tablet  1 tablet Oral TID Eileen Stanford, PA-C   1 tablet at 02/17/17 1115  . isosorbide mononitrate (IMDUR) 24 hr tablet 30 mg  30 mg Oral Daily Eileen Stanford, PA-C   30 mg at 02/17/17 1112  . metolazone (ZAROXOLYN) tablet 2.5 mg  2.5 mg Oral Daily Dorothy Spark, MD   2.5 mg at 02/17/17 1115  . ondansetron (ZOFRAN) injection 4 mg  4 mg Intravenous Q6H PRN Eileen Stanford, PA-C      . pantoprazole (PROTONIX) EC tablet 40 mg  40 mg Oral Daily Angelena Form R, PA-C      . potassium chloride SA (K-DUR,KLOR-CON) CR tablet 20 mEq  20 mEq Oral BID Eileen Stanford, PA-C   20 mEq at 02/17/17 1115  . sodium chloride flush (NS) 0.9 % injection 3 mL  3 mL Intravenous Q12H Eileen Stanford, PA-C   3 mL at 02/17/17 0844  . sodium chloride flush (NS) 0.9 % injection 3 mL  3 mL Intravenous PRN Eileen Stanford, PA-C        Allergies as of 02/16/2017 - Review Complete 02/16/2017  Allergen Reaction Noted  . Cephalexin Shortness Of  Breath 06/13/2016  . Codeine Anaphylaxis   . Contrast media [iodinated diagnostic agents] Other (See Comments) 10/30/2014  . Coreg [carvedilol] Other (See Comments) 11/12/2014  . Peppermint flavor Shortness Of Breath 11/21/2016  . Prednisone Anaphylaxis, Shortness Of Breath, and Swelling   . Tape Other (See Comments) 03/06/2013  . Ciprofloxacin Hives   . Latex Itching and Rash   . Penicillins Hives     Family  History  Problem Relation Age of Onset  . Heart attack Father   . Stroke Mother   . Diabetes Sister   . Colon cancer Other        seven family members  . Liver disease Neg Hx   . Other Neg Hx     Social History   Social History  . Marital status: Widowed    Spouse name: N/A  . Number of children: N/A  . Years of education: N/A   Occupational History  . Not on file.   Social History Main Topics  . Smoking status: Former Smoker    Packs/day: 1.00    Types: Cigarettes    Quit date: 09/26/1979  . Smokeless tobacco: Never Used  . Alcohol use No  . Drug use: No  . Sexual activity: No   Other Topics Concern  . Not on file   Social History Narrative  . No narrative on file    Review of Systems: Gen: + , fatigue, weakness, malaise, weight gain, and sleep disorder HEENT: No visual complaints, No history of Retinopathy. Normal external appearance No Epistaxis or Sore throat. No sinusitis.   CV: Denies chest pain, angina, palpitations, syncope,  + orthopnea and edema  Resp: , + dyspnea with exercise, no cough, sputum, wheezing,  GI: Denies vomiting blood, jaundice, and fecal incontinence.   Denies dysphagia or odynophagia. GU : Denies urinary burning, blood in urine, urinary frequency, urinary hesitancy, nocturnal urination, and urinary incontinence.  No renal calculi. MS: Denies joint pain, limitation of movement, and swelling, stiffness, low back pain, extremity pain. Denies muscle weakness, cramps, atrophy.  No use of non steroidal antiinflammatory drugs. Derm:  Denies rash, itching, dry skin, hives, moles, warts, or unhealing ulcers.  Psych: Denies depression, anxiety, memory loss, suicidal ideation, hallucinations, paranoia, and confusion. Heme: Denies bruising, bleeding, and enlarged lymph nodes. Neuro: No headache.  No diplopia. No dysarthria.  No dysphasia.  No history of CVA.  No Seizures. No paresthesias.  No weakness. Endocrine No DM.  No Thyroid disease.  No Adrenal disease.  Physical Exam: Vital signs in last 24 hours: Temp:  [97.7 F (36.5 C)-98.2 F (36.8 C)] 97.8 F (36.6 C) (05/26 0930) Pulse Rate:  [89-104] 91 (05/26 0930) Resp:  [16-22] 20 (05/26 0930) BP: (112-163)/(54-85) 115/58 (05/26 0930) SpO2:  [93 %-98 %] 93 % (05/26 0930) Weight:  [278 lb 9.6 oz (126.4 kg)-281 lb 3.2 oz (127.6 kg)] 278 lb 9.6 oz (126.4 kg) (05/26 0404) Last BM Date: 02/15/17 General:   Obese lady  pleasant and cooperative in NAD Head:  Normocephalic and atraumatic. Eyes:  Sclera clear, no icterus.   Conjunctiva pink. Ears:  Normal auditory acuity. Nose:  No deformity, discharge,  or lesions. Mouth:  No deformity or lesions, dentition normal. Neck:  Supple; no masses or thyromegaly. JVP  elevated Lungs:  Diminished breath sounds  Heart:  Regular rate and rhythm; no murmurs, clicks, rubs,  or gallops. Abdomen:  Soft,  Ascites noted Msk:  Symmetrical without gross deformities. Normal posture. Pulses:  No carotid, renal, femoral bruits. DP and PT symmetrical and equal Extremities:   Diffuse edema. Neurologic:  Alert and  oriented x4;  grossly normal neurologically. Skin:  Intact without significant lesions or rashes. Cervical Nodes:  No significant cervical adenopathy. Psych:  Alert and cooperative. Normal mood and affect.  Intake/Output from previous day: 05/25 0701 - 05/26 0700 In: 480 [P.O.:480] Out: 1400 [Urine:1400] Intake/Output this shift: Total I/O In: 120 [P.O.:120] Out: 1175 [  Urine:1150; Stool:25]  Lab Results:  Recent Labs   02/16/17 1339  WBC 6.6  HGB 10.6*  HCT 33.4*  PLT 124*   BMET  Recent Labs  02/16/17 1339 02/17/17 0406  NA 140 141  K 3.8 3.9  CL 102 101  CO2 28 31  GLUCOSE 116* 107*  BUN 62* 56*  CREATININE 1.82* 1.81*  CALCIUM 9.3 9.3   LFT  Recent Labs  02/16/17 1339  PROT 7.0  ALBUMIN 4.2  AST 25  ALT 19  ALKPHOS 71  BILITOT 0.8   PT/INR No results for input(s): LABPROT, INR in the last 72 hours. Hepatitis Panel No results for input(s): HEPBSAG, HCVAB, HEPAIGM, HEPBIGM in the last 72 hours.  Studies/Results: Dg Chest 2 View  Result Date: 02/17/2017 CLINICAL DATA:  Shortness of breath, cough. EXAM: CHEST  2 VIEW COMPARISON:  Radiograph of Jan 30, 2017. FINDINGS: Stable cardiomegaly. Mild central pulmonary vascular congestion is noted. No pneumothorax is noted. Atherosclerosis of thoracic aorta is noted. Single lead left-sided pacemaker is noted. Left lung is clear. Mild right basilar atelectasis or infiltrate is noted with small right pleural effusion. Bony thorax is unremarkable. IMPRESSION: Stable cardiomegaly and central pulmonary vascular congestion. Aortic atherosclerosis. Mild right basilar atelectasis or infiltrate is noted with small right pleural effusion. Electronically Signed   By: Marijo Conception, M.D.   On: 02/17/2017 09:09    Assessment/Plan:   This is a delightful lady with fairly significant heart failure that was admitted for an exacerbation of systolic dysfunction. She is grossly fluid overloaded and has been placed on lasix 76m IV  Her renal failure appears secondary to nephrosclerosis and she follows with Dr DJimmy Footman HTN / volume  Diuresis could be increased with use of a higher dose of lasix or could be augmented with metolazone  Anemia stable no ESA  Bones no vitamin D    LOS: 1 [name] [name] W @TODAY @11 :40 AM

## 2017-02-18 DIAGNOSIS — N183 Chronic kidney disease, stage 3 (moderate): Secondary | ICD-10-CM

## 2017-02-18 DIAGNOSIS — N1832 Chronic kidney disease, stage 3b: Secondary | ICD-10-CM

## 2017-02-18 LAB — BASIC METABOLIC PANEL
Anion gap: 7 (ref 5–15)
BUN: 55 mg/dL — AB (ref 6–20)
CHLORIDE: 101 mmol/L (ref 101–111)
CO2: 34 mmol/L — AB (ref 22–32)
CREATININE: 1.89 mg/dL — AB (ref 0.44–1.00)
Calcium: 9 mg/dL (ref 8.9–10.3)
GFR calc Af Amer: 30 mL/min — ABNORMAL LOW (ref >60)
GFR calc non Af Amer: 26 mL/min — ABNORMAL LOW (ref >60)
GLUCOSE: 87 mg/dL (ref 65–99)
POTASSIUM: 3.6 mmol/L (ref 3.5–5.1)
Sodium: 142 mmol/L (ref 135–145)

## 2017-02-18 MED ORDER — NYSTATIN-TRIAMCINOLONE 100000-0.1 UNIT/GM-% EX OINT
TOPICAL_OINTMENT | Freq: Two times a day (BID) | CUTANEOUS | Status: DC
  Administered 2017-02-18 – 2017-02-20 (×5): via TOPICAL
  Administered 2017-02-20: 1 via TOPICAL
  Administered 2017-02-21: 10:00:00 via TOPICAL
  Filled 2017-02-18: qty 15

## 2017-02-18 NOTE — Plan of Care (Signed)
Problem: Pain Managment: Goal: General experience of comfort will improve Outcome: Progressing Patient with chronic pain, taking home regimen   Problem: Fluid Volume: Goal: Ability to maintain a balanced intake and output will improve Outcome: Progressing Continues to diurese   Problem: Cardiac: Goal: Ability to achieve and maintain adequate cardiopulmonary perfusion will improve Outcome: Progressing Maintaining oxygen saturation on room air in the 90's

## 2017-02-18 NOTE — Progress Notes (Signed)
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 She was hospitalized in Feb with CHF exacerbation, (noting she was seen by [name]. Lovena [name] prior to admission but refused addition of metolazone), diuresed 30lbs, discharged 12/03/16. She was admitted with increased in weight and also dyspnea.  Creatinine about 1.6 - 1.8 baseline  No NSAIDS or ACE/ARB medication  No use of nephrotoxins  Patient appears to be having some diuresis although modest with lasix 59m q 8 hours  CT 7/17  Showed exophytic lesion right kidney and hepatic cirrhosis pattern   She has had a tremendous diuresis although states she is well above a target weight of 250 - 255 lbs   Objective:  Vital signs in last 24 hours:  Temp:  [97.5 F (36.4 C)-98 F (36.7 C)] 98 F (36.7 C) (05/27 0653) Pulse Rate:  [91-101] 101 (05/27 0653) Resp:  [19-20] 20 (05/27 0653) BP: (109-137)/(54-88) 109/54 (05/27 0653) SpO2:  [93 %-97 %] 94 % (05/27 0653) Weight:  [272 lb 3.2 oz (123.5 kg)] 272 lb 3.2 oz (123.5 kg) (05/27 0252)  Weight change: -9 lb (-4.082 kg) Filed Weights   02/16/17 1259 02/17/17 0404 02/18/17 0252  Weight: 281 lb 3.2 oz (127.6 kg) 278 lb 9.6 oz (126.4 kg) 272 lb 3.2 oz (123.5 kg)    Intake/Output: I/O last 3 completed shifts: In: 861[P.O.:880] Out: 5750 [Urine:5725; Stool:25]   Intake/Output this shift:  No intake/output data recorded.  CVS- RRR RS- CTA diminished breath sounds  ABD- BS present soft  ascites EXT- diffuse edema   Basic Metabolic Panel:  Recent Labs Lab 02/16/17 1339 02/17/17 0406 02/17/17 2142 02/18/17 0410  NA 140 141  --  142  K 3.8 3.9 3.5 3.6  CL 102 101  --  101  CO2 28 31  --  34*  GLUCOSE 116*  107*  --  87  BUN 62* 56*  --  55*  CREATININE 1.82* 1.81*  --  1.89*  CALCIUM 9.3 9.3  --  9.0    Liver Function Tests:  Recent Labs Lab 02/16/17 1339  AST 25  ALT 19  ALKPHOS 71  BILITOT 0.8  PROT 7.0  ALBUMIN 4.2   No results for input(s): LIPASE, AMYLASE in the last 168 hours. No results for input(s): AMMONIA in the last 168 hours.  CBC:  Recent Labs Lab 02/16/17 1339  WBC 6.6  NEUTROABS 5.0  HGB 10.6*  HCT 33.4*  MCV 96.0  PLT 124*    Cardiac Enzymes: No results for input(s): CKTOTAL, CKMB, CKMBINDEX, TROPONINI in the last 168 hours.  BNP: Invalid input(s): POCBNP  CBG: No results for input(s): GLUCAP in the last 168 hours.  Microbiology: Results for orders placed or performed during the hospital encounter of 11/21/16  Culture, Urine     Status: Abnormal   Collection Time: 12/01/16 12:25 PM  Result Value Ref Range Status   Specimen Description URINE, CATHETERIZED  Final   Special Requests NONE  Final   Culture >=100,000 COLONIES/mL ESCHERICHIA COLI (A)  Final   Report Status 12/04/2016 FINAL  Final   Organism ID, Bacteria ESCHERICHIA COLI (A)  Final      Susceptibility   Escherichia coli - MIC*    AMPICILLIN <=2 SENSITIVE Sensitive  CEFAZOLIN <=4 SENSITIVE Sensitive     CEFTRIAXONE <=1 SENSITIVE Sensitive     CIPROFLOXACIN <=0.25 SENSITIVE Sensitive     GENTAMICIN <=1 SENSITIVE Sensitive     IMIPENEM <=0.25 SENSITIVE Sensitive     NITROFURANTOIN <=16 SENSITIVE Sensitive     TRIMETH/SULFA <=20 SENSITIVE Sensitive     AMPICILLIN/SULBACTAM <=2 SENSITIVE Sensitive     PIP/TAZO <=4 SENSITIVE Sensitive     Extended ESBL NEGATIVE Sensitive     * >=100,000 COLONIES/mL ESCHERICHIA COLI    Coagulation Studies: No results for input(s): LABPROT, INR in the last 72 hours.  Urinalysis: No results for input(s): COLORURINE, LABSPEC, PHURINE, GLUCOSEU, HGBUR, BILIRUBINUR, KETONESUR, PROTEINUR, UROBILINOGEN, NITRITE, LEUKOCYTESUR in the last 72  hours.  Invalid input(s): APPERANCEUR    Imaging: Dg Chest 2 View  Result Date: 02/17/2017 CLINICAL DATA:  Shortness of breath, cough. EXAM: CHEST  2 VIEW COMPARISON:  Radiograph of Jan 30, 2017. FINDINGS: Stable cardiomegaly. Mild central pulmonary vascular congestion is noted. No pneumothorax is noted. Atherosclerosis of thoracic aorta is noted. Single lead left-sided pacemaker is noted. Left lung is clear. Mild right basilar atelectasis or infiltrate is noted with small right pleural effusion. Bony thorax is unremarkable. IMPRESSION: Stable cardiomegaly and central pulmonary vascular congestion. Aortic atherosclerosis. Mild right basilar atelectasis or infiltrate is noted with small right pleural effusion. Electronically Signed   By: Marijo Conception, M.D.   On: 02/17/2017 09:09     Medications:   . sodium chloride     . allopurinol  100 mg Oral BID  . aspirin  325 mg Oral Daily  . ferrous sulfate  325 mg Oral QPM  . furosemide  80 mg Intravenous Q8H  . heparin  5,000 Units Subcutaneous Q8H  . hydrALAZINE  12.5 mg Oral TID  . HYDROcodone-acetaminophen  1 tablet Oral TID  . isosorbide mononitrate  30 mg Oral Daily  . metolazone  2.5 mg Oral Daily  . pantoprazole  40 mg Oral Daily  . potassium chloride SA  20 mEq Oral BID  . sodium chloride flush  3 mL Intravenous Q12H   sodium chloride, acetaminophen, docusate sodium, ondansetron (ZOFRAN) IV, sodium chloride flush  Assessment/ Plan:   This is a delightful lady with fairly significant heart failure that was admitted for an exacerbation of systolic dysfunction. 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  [name] PPosey Prontowill be assuming the A service 5/28     LOS: 2 [name] [name] W @TODAY @8 :41 AM

## 2017-02-18 NOTE — Progress Notes (Signed)
Patient stable during 7 a to 7 p shift, no real complaints other than chronic pain issues especially in her knees which greatly limits her mobility.  Sat up in chair entire shift, up and down to [facility] with standby assist.  Patient continues to diurese.

## 2017-02-18 NOTE — Progress Notes (Signed)
Patient Name: [name] [name] Date of Encounter: 02/18/2017  Active Problems:   CHF (congestive heart failure) ([place])   Length of Stay: 2  SUBJECTIVE  Mild improvement in SOB.   CURRENT MEDS . allopurinol  100 mg Oral BID  . aspirin  325 mg Oral Daily  . ferrous sulfate  325 mg Oral QPM  . furosemide  80 mg Intravenous Q8H  . heparin  5,000 Units Subcutaneous Q8H  . hydrALAZINE  12.5 mg Oral TID  . HYDROcodone-acetaminophen  1 tablet Oral TID  . isosorbide mononitrate  30 mg Oral Daily  . metolazone  2.5 mg Oral Daily  . pantoprazole  40 mg Oral Daily  . potassium chloride SA  20 mEq Oral BID  . sodium chloride flush  3 mL Intravenous Q12H    OBJECTIVE  Vitals:   02/17/17 1620 02/17/17 2056 02/18/17 0252 02/18/17 0653  BP: 123/77 137/88  (!) 109/54  Pulse: 95 99  (!) 101  Resp: 19 20  20   Temp: 97.7 F (36.5 C) 97.5 F (36.4 C)  98 F (36.7 C)  TempSrc: Oral Oral  Oral  SpO2: 97% 95%  94%  Weight:   272 lb 3.2 oz (123.5 kg)   Height:        Intake/Output Summary (Last 24 hours) at 02/18/17 0855 Last data filed at 02/18/17 0700  Gross per 24 hour  Intake              640 ml  Output             4900 ml  Net            -4260 ml   Filed Weights   02/16/17 1259 02/17/17 0404 02/18/17 0252  Weight: 281 lb 3.2 oz (127.6 kg) 278 lb 9.6 oz (126.4 kg) 272 lb 3.2 oz (123.5 kg)    PHYSICAL EXAM  General: Pleasant, in no distress, SOB Neuro: Alert and oriented X 3. Moves all extremities spontaneously. Psych: Normal affect. HEENT:  Normal  Neck: Supple without bruits or JVD. Lungs:  Resp regular and unlabored, crackles B/L, decreased BS on the right  Heart: RRR no s3, s4, or murmurs. Abdomen: Soft, non-tender, non-distended, BS + x 4.  Extremities: No clubbing, cyanosis, severe LE edema with vesicles. DP/PT/Radials 2+ and equal bilaterally.  Accessory Clinical Findings  CBC  Recent Labs  02/16/17 1339  WBC 6.6  NEUTROABS 5.0  HGB 10.6*  HCT 33.4*    MCV 96.0  PLT 836*   Basic Metabolic Panel  Recent Labs  02/17/17 0406 02/17/17 2142 02/18/17 0410  NA 141  --  142  K 3.9 3.5 3.6  CL 101  --  101  CO2 31  --  34*  GLUCOSE 107*  --  87  BUN 56*  --  55*  CREATININE 1.81*  --  1.89*  CALCIUM 9.3  --  9.0   Liver Function Tests  Recent Labs  02/16/17 1339  AST 25  ALT 19  ALKPHOS 71  BILITOT 0.8  PROT 7.0  ALBUMIN 4.2    Recent Labs  02/16/17 1339  TSH 4.274   Radiology/Studies  Dg Chest 1 View  Result Date: 01/30/2017 CLINICAL DATA:  Right pleural effusion EXAM: CHEST 1 VIEW COMPARISON:  11/28/2016 FINDINGS: The heart is markedly enlarged. Single lead left subclavian pacemaker device and leads are stable and intact. Right pleural effusion is stable. Vascular congestion has increased. No Kerley B lines to suggest interstitial  edema. No pneumothorax. IMPRESSION: Stable right pleural effusion. Increasing vascular congestion. Electronically Signed   By: Marybelle Killings M.D.   On: 01/30/2017 13:08   Dg Chest 2 View  Result Date: 02/17/2017 CLINICAL DATA:  Shortness of breath, cough. EXAM: CHEST  2 VIEW COMPARISON:  Radiograph of Jan 30, 2017. FINDINGS: Stable cardiomegaly. Mild central pulmonary vascular congestion is noted. No pneumothorax is noted. Atherosclerosis of thoracic aorta is noted. Single lead left-sided pacemaker is noted. Left lung is clear. Mild right basilar atelectasis or infiltrate is noted with small right pleural effusion. Bony thorax is unremarkable. IMPRESSION: Stable cardiomegaly and central pulmonary vascular congestion. Aortic atherosclerosis. Mild right basilar atelectasis or infiltrate is noted with small right pleural effusion. Electronically Signed   By: Marijo Conception, M.D.   On: 02/17/2017 09:09   TELE: V paced rhythm    ASSESSMENT AND PLAN  1. Acute on chronic CHF (combined) - at home on Torsemide 40 mg PO BID, started on iv Lasix 80 mg Q8H, and metolazone 2.5 mg po daily with excellent  response, negative 4.2 L overnight and - 9 lbs, we will continue and monitor Crea very closely. - on admission 281 lbs, on discharge in March 2018 269 lbs, today 272 lbs, she states that her baseline is 255 lbs.  2. Permanent AFib CHA2DS2Vasc is at least 3, pt had peristently decline a/c  3. AVNode ablation w/PPM Device function is intact, she is programmed 90bpm, the patient is clear that she has had discussions with Dr. Lovena Le and historically at lower HR has felt terrible and he promised her he would not change the programming  Signed, Ena Dawley MD, [facility] 02/18/2017

## 2017-02-19 DIAGNOSIS — I4891 Unspecified atrial fibrillation: Secondary | ICD-10-CM

## 2017-02-19 LAB — BASIC METABOLIC PANEL
Anion gap: 10 (ref 5–15)
BUN: 51 mg/dL — AB (ref 6–20)
CHLORIDE: 99 mmol/L — AB (ref 101–111)
CO2: 32 mmol/L (ref 22–32)
CREATININE: 1.87 mg/dL — AB (ref 0.44–1.00)
Calcium: 9.2 mg/dL (ref 8.9–10.3)
GFR calc Af Amer: 30 mL/min — ABNORMAL LOW (ref >60)
GFR calc non Af Amer: 26 mL/min — ABNORMAL LOW (ref >60)
Glucose, Bld: 84 mg/dL (ref 65–99)
Potassium: 3.6 mmol/L (ref 3.5–5.1)
SODIUM: 141 mmol/L (ref 135–145)

## 2017-02-19 NOTE — Progress Notes (Signed)
Progress Note  Patient Name: [name] [name] Date of Encounter: 02/19/2017  Primary Cardiologist:   Deanna Boehlke/ Lovena Le  Subjective   Breathing much better than on admit  No CP    Inpatient Medications    Scheduled Meds: . allopurinol  100 mg Oral BID  . aspirin  325 mg Oral Daily  . ferrous sulfate  325 mg Oral QPM  . furosemide  80 mg Intravenous Q8H  . heparin  5,000 Units Subcutaneous Q8H  . hydrALAZINE  12.5 mg Oral TID  . HYDROcodone-acetaminophen  1 tablet Oral TID  . isosorbide mononitrate  30 mg Oral Daily  . metolazone  2.5 mg Oral Daily  . nystatin-triamcinolone ointment   Topical BID  . pantoprazole  40 mg Oral Daily  . potassium chloride SA  20 mEq Oral BID  . sodium chloride flush  3 mL Intravenous Q12H   Continuous Infusions: . sodium chloride     PRN Meds: sodium chloride, acetaminophen, docusate sodium, ondansetron (ZOFRAN) IV, sodium chloride flush   Vital Signs    Vitals:   02/18/17 1524 02/18/17 2227 02/19/17 0218 02/19/17 0424  BP: 129/76 (!) 112/59  125/63  Pulse: 99 90  97  Resp:  18    Temp:  97.7 F (36.5 C)  97.9 F (36.6 C)  TempSrc:  Oral  Oral  SpO2: 96% 95%  97%  Weight:   120.8 kg (266 lb 6.4 oz)   Height:        Intake/Output Summary (Last 24 hours) at 02/19/17 0754 Last data filed at 02/19/17 0748  Gross per 24 hour  Intake             1080 ml  Output             4000 ml  Net            -2920 ml    Net I/O  -8.2 L   Filed Weights   02/17/17 0404 02/18/17 0252 02/19/17 0218  Weight: 126.4 kg (278 lb 9.6 oz) 123.5 kg (272 lb 3.2 oz) 120.8 kg (266 lb 6.4 oz)    Telemetry    Atrial fib  Paced   - Personally Reviewed  ECG      Physical Exam   GEN: No acute distress.   Neck: JVP is increased   Cardiac: RRR,II/VI systolic murmur at apex  , rubs, or gallops.  Respiratory: Clear to auscultation bilaterally. GI: Soft, nontender, non-distended  MS: 2+ edema; No deformity. Neuro:  Nonfocal  Psych: Normal affect    Labs    Chemistry Recent Labs Lab 02/16/17 1339 02/17/17 0406 02/17/17 2142 02/18/17 0410 02/19/17 0253  NA 140 141  --  142 141  K 3.8 3.9 3.5 3.6 3.6  CL 102 101  --  101 99*  CO2 28 31  --  34* 32  GLUCOSE 116* 107*  --  87 84  BUN 62* 56*  --  55* 51*  CREATININE 1.82* 1.81*  --  1.89* 1.87*  CALCIUM 9.3 9.3  --  9.0 9.2  PROT 7.0  --   --   --   --   ALBUMIN 4.2  --   --   --   --   AST 25  --   --   --   --   ALT 19  --   --   --   --   ALKPHOS 71  --   --   --   --  BILITOT 0.8  --   --   --   --   GFRNONAA 27* 27*  --  26* 26*  GFRAA 31* 31*  --  30* 30*  ANIONGAP 10 9  --  7 10     Hematology Recent Labs Lab 02/16/17 1339  WBC 6.6  RBC 3.48*  HGB 10.6*  HCT 33.4*  MCV 96.0  MCH 30.5  MCHC 31.7  RDW 15.7*  PLT 124*    Cardiac EnzymesNo results for input(s): TROPONINI in the last 168 hours. No results for input(s): TROPIPOC in the last 168 hours.   BNPNo results for input(s): BNP, PROBNP in the last 168 hours.   DDimer No results for input(s): DDIMER in the last 168 hours.   Radiology    Dg Chest 2 View  Result Date: 02/17/2017 CLINICAL DATA:  Shortness of breath, cough. EXAM: CHEST  2 VIEW COMPARISON:  Radiograph of Jan 30, 2017. FINDINGS: Stable cardiomegaly. Mild central pulmonary vascular congestion is noted. No pneumothorax is noted. Atherosclerosis of thoracic aorta is noted. Single lead left-sided pacemaker is noted. Left lung is clear. Mild right basilar atelectasis or infiltrate is noted with small right pleural effusion. Bony thorax is unremarkable. IMPRESSION: Stable cardiomegaly and central pulmonary vascular congestion. Aortic atherosclerosis. Mild right basilar atelectasis or infiltrate is noted with small right pleural effusion. Electronically Signed   By: Marijo Conception, M.D.   On: 02/17/2017 09:09    Cardiac Studies     Patient Profile     71 y.o. female history of acute on chronic systolic CHF, permanent atrial fib, s/p  PPM  Assessment & Plan    1  Acute on chronic systolid/diastolic CHF    LVEF 62%   PT continues to diurese  Still with volume overload on exam  I would recomm continuing IV lasix   Once she is ready to transition to PO I would switch to Demedex for better absorption   2  Pemanent atrial fib   CHADSVASc at least 3  Pt denies anticoag  3  AVN ablation with PPM  4  CKD Stage III  Renal function rel stable    Signed, Dorris Carnes, MD  02/19/2017, 7:54 AM

## 2017-02-19 NOTE — Progress Notes (Signed)
Pt educated about safety and importance of bed alarm during the night however pt refuses to be on bed alarm. Will continue to round on patient.   Maveryk Renstrom, RN    

## 2017-02-20 LAB — BASIC METABOLIC PANEL
ANION GAP: 12 (ref 5–15)
BUN: 52 mg/dL — ABNORMAL HIGH (ref 6–20)
CHLORIDE: 95 mmol/L — AB (ref 101–111)
CO2: 32 mmol/L (ref 22–32)
Calcium: 9.2 mg/dL (ref 8.9–10.3)
Creatinine, Ser: 1.97 mg/dL — ABNORMAL HIGH (ref 0.44–1.00)
GFR calc Af Amer: 28 mL/min — ABNORMAL LOW (ref >60)
GFR, EST NON AFRICAN AMERICAN: 24 mL/min — AB (ref >60)
GLUCOSE: 88 mg/dL (ref 65–99)
POTASSIUM: 3.6 mmol/L (ref 3.5–5.1)
Sodium: 139 mmol/L (ref 135–145)

## 2017-02-20 NOTE — Progress Notes (Signed)
Patient with no complaints or concerns during 7pm - 7am shift.  Zoie Sarin, RN 

## 2017-02-20 NOTE — Consult Note (Signed)
   [facility] Inpatient Consult   02/20/2017  [name] [name] 1946-09-23 [id number]  Patient was assessed for hospitalization in the [place] management network and had previously had EMMI HF follow up in March 2018.  Patient admitted with HF exacerbation related to fluid overload. Met with the patient at bedside.  Patient endorses Dr. [name] at [facility] as her primary care provider.  Patient accepted brochure with [place] Management information.  Patient declined telephonic follow up.  Patient from [place].  Has [facility] for [facility] and her primary care provider will provide transition of care follow up post hospital. No further needs noted. For questions, please contact:  Natividad Brood, RN BSN [facility] Liaison  [phone] business mobile phone Toll free office [phone]

## 2017-02-20 NOTE — Care Management Important Message (Signed)
Important Message  Patient Details  Name: [name] [name] MRN: [id number] Date of Birth: Feb 01, 1946   Medicare Important Message Given:  Yes    Kirsti Mcalpine 02/20/2017, 1:22 PM

## 2017-02-20 NOTE — Progress Notes (Signed)
Notified that pt had 5 beats of VT.Pt asymptomatic,no complaints. MD on call [facility] notified. No new orders. Will continue to monitor.  Cheryl Stabenow, RN

## 2017-02-20 NOTE — Progress Notes (Signed)
Progress Note  Patient Name: [name] [name] Date of Encounter: 02/20/2017  Primary Cardiologist:   Ross/ Lovena Le  Subjective   Dyspnea improving; no chest pain  Inpatient Medications    Scheduled Meds: . allopurinol  100 mg Oral BID  . aspirin  325 mg Oral Daily  . ferrous sulfate  325 mg Oral QPM  . furosemide  80 mg Intravenous Q8H  . heparin  5,000 Units Subcutaneous Q8H  . hydrALAZINE  12.5 mg Oral TID  . HYDROcodone-acetaminophen  1 tablet Oral TID  . isosorbide mononitrate  30 mg Oral Daily  . metolazone  2.5 mg Oral Daily  . nystatin-triamcinolone ointment   Topical BID  . pantoprazole  40 mg Oral Daily  . potassium chloride SA  20 mEq Oral BID  . sodium chloride flush  3 mL Intravenous Q12H   Continuous Infusions: . sodium chloride     PRN Meds: sodium chloride, acetaminophen, docusate sodium, ondansetron (ZOFRAN) IV, sodium chloride flush   Vital Signs    Vitals:   02/19/17 2038 02/19/17 2204 02/20/17 0334 02/20/17 0517  BP: (!) 115/58 121/62  127/71  Pulse: (!) 103 (!) 101  98  Resp: 18   20  Temp: 97.6 F (36.4 C)   97.8 F (36.6 C)  TempSrc: Oral   Oral  SpO2: 96% 94%  97%  Weight:   118.6 kg (261 lb 6.4 oz)   Height:        Intake/Output Summary (Last 24 hours) at 02/20/17 0912 Last data filed at 02/20/17 0840  Gross per 24 hour  Intake              700 ml  Output             3050 ml  Net            -2350 ml       Filed Weights   02/18/17 0252 02/19/17 0218 02/20/17 0334  Weight: 123.5 kg (272 lb 3.2 oz) 120.8 kg (266 lb 6.4 oz) 118.6 kg (261 lb 6.4 oz)    Telemetry    Atrial fib  Paced   - Personally Reviewed   Physical Exam   GEN: WD/obese, NAD Neck: supple Cardiac: irregular Respiratory: CTA; no wheeze GI: Soft, nontender, non-distended, no masses MS: 1+ edema; chronic skin changes Neuro:  Grossly intact   Labs    Chemistry Recent Labs Lab 02/16/17 1339  02/18/17 0410 02/19/17 0253 02/20/17 0558  NA 140  <  > 142 141 139  K 3.8  < > 3.6 3.6 3.6  CL 102  < > 101 99* 95*  CO2 28  < > 34* 32 32  GLUCOSE 116*  < > 87 84 88  BUN 62*  < > 55* 51* 52*  CREATININE 1.82*  < > 1.89* 1.87* 1.97*  CALCIUM 9.3  < > 9.0 9.2 9.2  PROT 7.0  --   --   --   --   ALBUMIN 4.2  --   --   --   --   AST 25  --   --   --   --   ALT 19  --   --   --   --   ALKPHOS 71  --   --   --   --   BILITOT 0.8  --   --   --   --   GFRNONAA 27*  < > 26* 26* 24*  GFRAA 31*  < >  30* 30* 28*  ANIONGAP 10  < > 7 10 12   < > = values in this interval not displayed.   Hematology  Recent Labs Lab 02/16/17 1339  WBC 6.6  RBC 3.48*  HGB 10.6*  HCT 33.4*  MCV 96.0  MCH 30.5  MCHC 31.7  RDW 15.7*  PLT 124*    Patient Profile     71 y.o. female history of acute on chronic systolic CHF, permanent atrial fib, s/p PPM  Assessment & Plan    1  Acute on chronic systolic/diastolic CHF    LVEF 49%   Pt continues to improve; will continue present dose of lasix today and follow renal function; transition to demadex at DC.  2  Pemanent atrial fib   CHADSVASc at least 3  Pt refuses anticoagulation.  3  AVN ablation with PPM  4  CKD Stage III  Follow renal function with diuresis.  5 NICM-continue hydralazine/nitrates; no ARB due to renal insuff. No beta blocker due to allergy per pt.  Signed, Kirk Ruths, MD  02/20/2017, 9:12 AM

## 2017-02-20 NOTE — Care Management Note (Signed)
Case Management Note  Patient Details  Name: ATOYA ANDREW MRN: [id number] Date of Birth: July 02, 1946  Subjective/Objective:                 Patient from home with children, daughter is a cardiology PA. Patient has DME Wheelchair, Civil engineer, contracting with back, Walker, CPAP. Medication coverage through Medicare. Patient discharged with [facility] in February of this year.  PCP [place]   Action/Plan:  CM will continue to follow. Expected Discharge Date:  02/21/17               Expected Discharge Plan:  [place]  In-House Referral:     Discharge planning Services  CM Consult  Post Acute Care Choice:    Choice offered to:     DME Arranged:    DME Agency:     HH Arranged:    [place] Agency:     Status of Service:  In process, will continue to follow  If discussed at Long Length of Stay Meetings, dates discussed:    Additional Comments: 02/20/2017 Pt alert and oriented.  Pt states her daughter is securing scale that she can utilize (wheelchair bound).  Pt states she is home alone however children are close by.  Pt states she tries her best to eliminate all salt from diet.  Pt denied barriers to returning home.  Has PCP and denied barriers to obtaining prescribed medications Maryclare Labrador, RN 02/20/2017, 3:26 PM

## 2017-02-21 LAB — BASIC METABOLIC PANEL
ANION GAP: 10 (ref 5–15)
BUN: 54 mg/dL — AB (ref 6–20)
CO2: 35 mmol/L — ABNORMAL HIGH (ref 22–32)
Calcium: 9.1 mg/dL (ref 8.9–10.3)
Chloride: 93 mmol/L — ABNORMAL LOW (ref 101–111)
Creatinine, Ser: 2.06 mg/dL — ABNORMAL HIGH (ref 0.44–1.00)
GFR calc Af Amer: 27 mL/min — ABNORMAL LOW (ref >60)
GFR, EST NON AFRICAN AMERICAN: 23 mL/min — AB (ref >60)
GLUCOSE: 92 mg/dL (ref 65–99)
POTASSIUM: 3.7 mmol/L (ref 3.5–5.1)
Sodium: 138 mmol/L (ref 135–145)

## 2017-02-21 MED ORDER — TORSEMIDE 20 MG PO TABS
40.0000 mg | ORAL_TABLET | Freq: Two times a day (BID) | ORAL | Status: DC
  Administered 2017-02-21 (×2): 40 mg via ORAL
  Administered 2017-02-22: 20 mg via ORAL
  Filled 2017-02-21 (×3): qty 2

## 2017-02-21 MED ORDER — POLYETHYLENE GLYCOL 3350 17 G PO PACK: 17.0000 g | PACK | Freq: Every day | ORAL | Status: DC | PRN

## 2017-02-21 MED ORDER — GLYCERIN (LAXATIVE) 2.1 G RE SUPP: 1.0000 | Freq: Every day | RECTAL | Status: DC | PRN

## 2017-02-21 MED ORDER — SENNOSIDES-DOCUSATE SODIUM 8.6-50 MG PO TABS
2.0000 | ORAL_TABLET | Freq: Every evening | ORAL | Status: DC | PRN
  Administered 2017-02-21: 1 via ORAL
  Filled 2017-02-21: qty 2

## 2017-02-21 NOTE — Progress Notes (Signed)
Pt c/o dull 4/10 pain under her left breast/ribs. Pain is greater when pt is lying in bed vs sitting in recliner. Scheduled Norco given tonight. Will continue to monitor.

## 2017-02-21 NOTE — Progress Notes (Addendum)
Progress Note  Patient Name: [name] [name] Date of Encounter: 02/21/2017  Primary Cardiologist:   Ross/ Lovena Le  Subjective   Denies dyspnea; lower chest pain (under left ribs)  Inpatient Medications    Scheduled Meds: . allopurinol  100 mg Oral BID  . aspirin  325 mg Oral Daily  . ferrous sulfate  325 mg Oral QPM  . furosemide  80 mg Intravenous Q8H  . heparin  5,000 Units Subcutaneous Q8H  . hydrALAZINE  12.5 mg Oral TID  . HYDROcodone-acetaminophen  1 tablet Oral TID  . isosorbide mononitrate  30 mg Oral Daily  . metolazone  2.5 mg Oral Daily  . nystatin-triamcinolone ointment   Topical BID  . pantoprazole  40 mg Oral Daily  . potassium chloride SA  20 mEq Oral BID  . sodium chloride flush  3 mL Intravenous Q12H   Continuous Infusions: . sodium chloride     PRN Meds: sodium chloride, acetaminophen, docusate sodium, ondansetron (ZOFRAN) IV, sodium chloride flush   Vital Signs    Vitals:   02/20/17 1204 02/20/17 1922 02/21/17 0359 02/21/17 0629  BP: (!) 110/59 127/66  115/62  Pulse: 95 98  90  Resp: 18 18  18   Temp: 97.7 F (36.5 C) 97.9 F (36.6 C)  97.8 F (36.6 C)  TempSrc: Oral Oral  Oral  SpO2: 98% 96%  98%  Weight:   116.6 kg (257 lb)   Height:        Intake/Output Summary (Last 24 hours) at 02/21/17 0856 Last data filed at 02/21/17 0814  Gross per 24 hour  Intake              460 ml  Output             4250 ml  Net            -3790 ml       Filed Weights   02/19/17 0218 02/20/17 0334 02/21/17 0359  Weight: 120.8 kg (266 lb 6.4 oz) 118.6 kg (261 lb 6.4 oz) 116.6 kg (257 lb)    Telemetry    Atrial fib  Paced   - Personally Reviewed   Physical Exam   GEN: WD/obese, sitting in chair, NAD Neck: No JVD Cardiac: irregular, 2/6 systolic murmur Respiratory: CTA; no wheeze or rhonchi GI: Soft, nontender, non-distended, no masses, obese MS: trace edema; chronic skin changes Neuro:  no focal findings   Labs    Chemistry Recent  Labs Lab 02/16/17 1339  02/19/17 0253 02/20/17 0558 02/21/17 0235  NA 140  < > 141 139 138  K 3.8  < > 3.6 3.6 3.7  CL 102  < > 99* 95* 93*  CO2 28  < > 32 32 35*  GLUCOSE 116*  < > 84 88 92  BUN 62*  < > 51* 52* 54*  CREATININE 1.82*  < > 1.87* 1.97* 2.06*  CALCIUM 9.3  < > 9.2 9.2 9.1  PROT 7.0  --   --   --   --   ALBUMIN 4.2  --   --   --   --   AST 25  --   --   --   --   ALT 19  --   --   --   --   ALKPHOS 71  --   --   --   --   BILITOT 0.8  --   --   --   --   BCWUGQBV  27*  < > 26* 24* 23*  GFRAA 31*  < > 30* 28* 27*  ANIONGAP 10  < > 10 12 10   < > = values in this interval not displayed.   Hematology  Recent Labs Lab 02/16/17 1339  WBC 6.6  RBC 3.48*  HGB 10.6*  HCT 33.4*  MCV 96.0  MCH 30.5  MCHC 31.7  RDW 15.7*  PLT 124*    Patient Profile     71 y.o. female history of acute on chronic systolic CHF, permanent atrial fib, s/p PPM  Assessment & Plan    1  Acute on chronic systolic/diastolic CHF    LVEF 38%  I/O -3770 Pt continues to improve; will change IV lasix to po demadex (40 mg BID; take additional 40 mg daily for weight gain of 2-3 lbs); pt instructed on low Na diet and fluid restriction. Change metolazone to 2.5 mg once weekly at DC.  2  Pemanent atrial fib   CHADSVASc 5.  Pt refuses anticoagulation.  3  AVN ablation with PPM  4  CKD Stage III  Cr mildly increased; repeat in AM.  5 NICM-continue hydralazine/nitrates; no ARB or ACEI due to renal insuff. No beta blocker due to allergy per pt.  Signed, Kirk Ruths, MD  02/21/2017, 8:56 AM

## 2017-02-21 NOTE — Progress Notes (Signed)
Pt continues to refuse bed alarm. Will continue to monitor.

## 2017-02-22 ENCOUNTER — Encounter (HOSPITAL_COMMUNITY): Payer: Self-pay | Admitting: Physician Assistant

## 2017-02-22 DIAGNOSIS — I428 Other cardiomyopathies: Secondary | ICD-10-CM

## 2017-02-22 HISTORY — DX: Other cardiomyopathies: I42.8

## 2017-02-22 LAB — BASIC METABOLIC PANEL
ANION GAP: 13 (ref 5–15)
BUN: 51 mg/dL — ABNORMAL HIGH (ref 6–20)
CHLORIDE: 92 mmol/L — AB (ref 101–111)
CO2: 32 mmol/L (ref 22–32)
CREATININE: 2.02 mg/dL — AB (ref 0.44–1.00)
Calcium: 9.1 mg/dL (ref 8.9–10.3)
GFR calc non Af Amer: 24 mL/min — ABNORMAL LOW (ref >60)
GFR, EST AFRICAN AMERICAN: 27 mL/min — AB (ref >60)
Glucose, Bld: 81 mg/dL (ref 65–99)
POTASSIUM: 3.7 mmol/L (ref 3.5–5.1)
SODIUM: 137 mmol/L (ref 135–145)

## 2017-02-22 MED ORDER — POLYETHYLENE GLYCOL 3350 17 G PO PACK: 17.0000 g | PACK | Freq: Every day | ORAL | 0 refills | Status: DC | PRN

## 2017-02-22 MED ORDER — NYSTATIN-TRIAMCINOLONE 100000-0.1 UNIT/GM-% EX OINT: TOPICAL_OINTMENT | Freq: Two times a day (BID) | CUTANEOUS | 0 refills | Status: DC

## 2017-02-22 MED ORDER — METOLAZONE 2.5 MG PO TABS: 2.5000 mg | ORAL_TABLET | ORAL | 3 refills | Status: DC

## 2017-02-22 MED ORDER — GLYCERIN (LAXATIVE) 2.1 G RE SUPP: 1.0000 | Freq: Every day | RECTAL | 0 refills | Status: DC | PRN

## 2017-02-22 MED ORDER — TORSEMIDE 20 MG PO TABS: 40.0000 mg | ORAL_TABLET | Freq: Two times a day (BID) | ORAL | 6 refills | Status: DC

## 2017-02-22 MED ORDER — SENNOSIDES-DOCUSATE SODIUM 8.6-50 MG PO TABS: 2.0000 | ORAL_TABLET | Freq: Every evening | ORAL | Status: DC | PRN

## 2017-02-22 NOTE — Discharge Instructions (Signed)
Daily weights, ok to take an extra Torsemide/Demadex for a gain of 3 lbs in a day or 5 lbs in a week.

## 2017-02-22 NOTE — Progress Notes (Signed)
D/c teaching complete waiting on family to bring clothes.

## 2017-02-22 NOTE — Discharge Summary (Signed)
Discharge Summary    Patient ID: [name] [name],  MRN: [id number], DOB/AGE: 1946/05/12 71 y.o.  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 Cephalexin Shortness Of Breath    sob  . Codeine Anaphylaxis    REACTION: throat swelling  . Contrast Media [Iodinated Diagnostic Agents] Other (See Comments)    Patient states "I have chronic kidney disease so the doctor said no dye in my veins."  . Coreg [Carvedilol] Other (See Comments)    Beta Blockers cause her organs to shut down per patient  . Peppermint Flavor Shortness Of Breath  . Prednisone Anaphylaxis, Shortness Of Breath and Swelling    REACTION: swelling, S.O.B.  . Tape Other (See Comments)    States plastic tape blisters her skin  . Ciprofloxacin Hives  . Latex Itching and Rash  . Penicillins Hives    Diagnostic Studies/Procedures    Chest x-ray results below _____________   History of Present Illness     [name] [name] a 71 y.o.femalewith history of Permanent AF s/p AVN ablation w/PPM, HTN, chronic LE edema, venus insufficiency, cirrhosis presumably 2/2 fatty liver disease, chronic class II CHF (diastolic), CRI (3-4), limited mobility (uses scooter), OSA w/CPAP. CHADS2VASC=4 (CHF, HTN, age x 1, female), pt has declined anticoagulation.Hospitalized with CHF exacerbation 11/21/2016, diuresed 30lbs, d/c weight 257 lbs. discharged 12/03/16.  Seen in office 05/25, increased CHF sx, volume overloaded on exam, wt 286  lbs. Pt admitted.   Hospital Course     Consultants: Nephrology   She was initially given Lasix 80 mg IV BID, but this was not very effective and it was increased to 80 mg q8h. Metolazone 2.5 mg qd was added. Her weight decreased steadily.   A Nephrology consult was called to help with diuresis and management of her renal function. 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 Her anemia was stable. No further Nephrology workup needed.  By 05/30, her volume status had improved to the point that she could be changed to oral Lasix. The metolazone was also discontinued. Her electrolytes were followed during her hospital stay and her potassium was supplemented as needed. Her blood pressure was managed by her home medications including hydralazine and nitrates.  Her pacemaker is set to make her minimum heart rate 90. This is managed by Dr. Lovena Le. Her device was functioning normally during her hospital stay. Her CHADS2VASC score is 4, but she has repeatedly declined anticoagulation. She was given DVT prophylaxis with subcutaneous heparin during her hospital stay.  She does not have scales at home, and was evaluated by Carole Binning, RN (the heart failure nurse). She was given scales and compliance with dietary restrictions and medications was encouraged. If her weight and CHF symptoms cannot be managed with close follow up, she will need to be referred to the CHF clinic.   On 05/31, she was seen by Dr Stanford Breed and all data were reviewed. He outlined her discharge medications and follow up  appointments and plans.  No further inpatient workup was indicated and she is considered stable for discharge, to follow up as an outpatient.   _____________  Discharge Vitals Blood pressure 122/74, pulse 97, temperature 97.7 F (36.5 C), temperature source Oral, resp. rate 20, height 5' 6"  (1.676 m), weight 253 lb 9.6 oz (115 kg), SpO2 97 %.  Filed  Weights   02/21/17 0359 02/22/17 0127 02/22/17 0500  Weight: 257 lb (116.6 kg) 258 lb 4.8 oz (117.2 kg) 253 lb 9.6 oz (115 kg)    Labs & Radiologic Studies    CBC CBC Latest Ref Rng & Units 02/16/2017 12/03/2016 12/02/2016  WBC 4.0 - 10.5 K/uL 6.6 4.7 4.5  Hemoglobin 12.0 - 15.0 g/dL 10.6(L) 10.9(L) 11.1(L)  Hematocrit 36.0 - 46.0 % 33.4(L) 33.0(L) 33.8(L)  Platelets 150 - 400 K/uL 124(L) 144(L) 154     Basic Metabolic Panel  Recent Labs  02/21/17 0235 02/22/17 0526  NA 138 137  K 3.7 3.7  CL 93* 92*  CO2 35* 32  GLUCOSE 92 81  BUN 54* 51*  CREATININE 2.06* 2.02*  CALCIUM 9.1 9.1   Liver Function Tests Hepatic Function Panel     Component Value Date/Time   PROT 7.0 02/16/2017 1339   ALBUMIN 4.2 02/16/2017 1339   AST 25 02/16/2017 1339   ALT 19 02/16/2017 1339   ALKPHOS 71 02/16/2017 1339   BILITOT 0.8 02/16/2017 1339   Thyroid Function Tests Lab Results  Component Value Date   TSH 4.274 02/16/2017    _____________   Dg Chest 2 View Result Date: 02/17/2017 CLINICAL DATA:  Shortness of breath, cough. EXAM: CHEST  2 VIEW COMPARISON:  Radiograph of Jan 30, 2017. FINDINGS: Stable cardiomegaly. Mild central pulmonary vascular congestion is noted. No pneumothorax is noted. Atherosclerosis of thoracic aorta is noted. Single lead left-sided pacemaker is noted. Left lung is clear. Mild right basilar atelectasis or infiltrate is noted with small right pleural effusion. Bony thorax is unremarkable. IMPRESSION: Stable cardiomegaly and central pulmonary vascular congestion. Aortic atherosclerosis. Mild right basilar atelectasis or infiltrate is noted with small right pleural effusion. Electronically Signed   By: Marijo Conception, M.D.   On: 02/17/2017 09:09   Disposition   Pt is being discharged home today in good condition.  Follow-up Plans & Appointments    Follow-up Information    Liliane Shi, PA-C Follow up on 02/26/2017.   Specialties:  Cardiology, Physician  Assistant Why:  Please arrive at 9:30 am for a 9:45 am appt, hospital follow up Contact information: [address] [place] [address] [phone]        Fay Records, MD Follow up on 04/05/2017.   Specialty:  Cardiology Why:  8:20 AM for follow up Contact information: [place] [address] [phone]          Discharge Instructions    ([place]) Call MD:  Anytime you have any of the following symptoms: 1) 3 pound weight gain in 24 hours or 5 pounds in 1 week 2) shortness of breath, with or without a dry hacking cough 3) swelling in the hands, feet or stomach 4) if you have to sleep on extra pillows at night in order to breathe.    Complete by:  As directed    Diet - low sodium heart healthy    Complete by:  As directed    Increase activity slowly    Complete by:  As  directed       Discharge Medications   Current Discharge Medication List    START taking these medications   Details  Glycerin, Adult, 2.1 g SUPP Place 1 suppository rectally daily as needed for moderate constipation. Refills: 0    metolazone (ZAROXOLYN) 2.5 MG tablet Take 1 tablet (2.5 mg total) by mouth once a week. Take on Tuesdays, take 30 minutes before the torsemide/Demadex Qty: 10 tablet, Refills: 3    nystatin-triamcinolone ointment (MYCOLOG) Apply topically 2 (two) times daily. Qty: 30 g, Refills: 0    polyethylene glycol (MIRALAX / GLYCOLAX) packet Take 17 g by mouth daily as needed for mild constipation or moderate constipation. Qty: 14 each, Refills: 0    senna-docusate (SENOKOT-S) 8.6-50 MG tablet Take 2 tablets by mouth at bedtime as needed for mild constipation or moderate constipation.      CONTINUE these medications which have CHANGED   Details  torsemide (DEMADEX) 20 MG tablet Take 2 tablets (40 mg total) by mouth 2 (two) times daily. OK to take an additional 40 mg daily prn for weight gain. Qty: 150 tablet,  Refills: 6      CONTINUE these medications which have NOT CHANGED   Details  allopurinol (ZYLOPRIM) 100 MG tablet Take 100 mg by mouth 2 (two) times daily.    aspirin 325 MG tablet Take 325 mg by mouth daily.      calcitRIOL (ROCALTROL) 0.25 MCG capsule Take 0.25 mcg by mouth every other day.     calcium carbonate (OS-CAL) 600 MG tablet Take 600 mg by mouth daily.    COD LIVER OIL PO Take 1 tablet by mouth daily.      docusate sodium (COLACE) 100 MG capsule Take 100 mg by mouth daily as needed for mild constipation.    ferrous sulfate 325 (65 FE) MG tablet Take 325 mg by mouth every evening.     hydrALAZINE (APRESOLINE) 25 MG tablet Take 0.5 tablets (12.5 mg total) by mouth 3 (three) times daily. Qty: 45 tablet, Refills: 11    HYDROcodone-acetaminophen (NORCO) 10-325 MG per tablet Take 1 tablet by mouth 2 (two) times daily.     isosorbide mononitrate (IMDUR) 30 MG 24 hr tablet Take 1 tablet (30 mg total) by mouth daily. Qty: 90 tablet, Refills: 3    Multiple Vitamin (MULTIVITAMIN) tablet Take 1 tablet by mouth daily.    nystatin (MYCOSTATIN/NYSTOP) powder Apply 1 application topically every other day.    omeprazole (PRILOSEC) 20 MG capsule Take 20 mg by mouth daily.      potassium chloride SA (K-DUR,KLOR-CON) 20 MEQ tablet Take 1 tablet (20 mEq total) by mouth 2 (two) times daily. Qty: 180 tablet, Refills: 3    vitamin B-12 (CYANOCOBALAMIN) 500 MCG tablet Take 500 mcg by mouth daily.         Outstanding Labs/Studies   None  Duration of Discharge Encounter   Greater than 30 minutes including physician time.  Jonetta Speak NP 02/22/2017, 12:49 PM

## 2017-02-22 NOTE — Progress Notes (Addendum)
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 She was hospitalized in Feb with CHF exacerbation, (noting she was seen by Dr. Lovena Le prior to admission but refused addition of metolazone), diuresed 30lbs, discharged 12/03/16.  She saw GI yesterday found to be fluid OL and instructed to see cardiology, Dr. Saunders Revel DOD yesterday recommended ASAP appointment and she was offered a same day appt but declinedwith transportation issues and placed on my schedule for today.  She last saw out pt gen cardiology, Dr. Harrington Challenger in 2016, most recently was her hospital stay in Feb.  She comes today accompanied by her daughter, she has been gaining back weight again she says since the day she went home in March, she is unable to weigh at home but her swelling as steadily worsened and clothes becoming tighter. No palpitations, near syncope or syncope. She does not ambulate with severe b/l knee pain, but will take a few steps to transfer this is becoming increasingly difficult with SOB, and even just moving in the chair and adjusting herself makes her SOB. She says breathing in any recline has become difficult and needs to be seated "absolutely upright" to feel like she can breath adequately. She mentions a pressure in her epigastrum that has been on/off for a few days as well, does not seem to correlate with anything particularly. She reports her GI doctor told her she had fluid by her lung seen in an [place]. I have not been able to find this result.  Past Medical History:  Diagnosis Date  . Anemia   . Atrial fibrillation ([place])   . Bursitis   . Cancer ([place])   . Cataract   . Cervical cancer ([place]) 1991   s/p hysterectomy  . Chronic cellulitis   .  Chronic combined systolic and diastolic congestive heart failure, NYHA class 2 (HCC)    LVEF 25-30% with restrictive diastolic filling  . Degenerative joint disease   . Diverticulitis   . Essential hypertension   . Gallstones   . GERD (gastroesophageal reflux disease)   . Gout   . Kidney stones   . Morbid obesity ([place])   . NICM (nonischemic cardiomyopathy) ([place]) 02/22/2017  . Osteoarthritis   . Permanent atrial fibrillation ([place]) 05/04/2009   Qualifier: History of  By: Quentin Cornwall CMA, Janett Billow    . 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   Qualifier: Diagnosis of  By: Lovena Le, MD, [facility], Binnie Kand   . 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 Lovena Le  . Sleep apnea    CPAP    Social History   Social History  . Marital status: Widowed    Spouse name: N/A  . Number of children: N/A  . Years of education: N/A   Social History Main Topics  . Smoking status: Former Smoker    Packs/day: 1.00    Types: Cigarettes    Quit date: 09/26/1979  . Smokeless tobacco: Never Used  . Alcohol use No  . Drug use: No  . Sexual activity: No   Other Topics Concern  . None   Social History Narrative  . None    ECHO:Study Conclusions-11/22/16  - Procedure narrative: Transthoracic echocardiography. Image   quality was adequate. The study was technically difficult, as a  result of body habitus. - Left ventricle: The cavity size was normal. Wall thickness was   increased in a pattern of mild LVH. The estimated ejection   fraction was 35%. There is hypokinesis of the   mid-apicalanteroseptal and apical myocardium. The study is not   technically sufficient to allow evaluation of LV diastolic   function. - Aortic valve: Trileaflet; moderately calcified leaflets. There   was mild stenosis. - Mitral valve: Calcified annulus. Mildly thickened leaflets .   There was mild regurgitation. - Left atrium: The atrium was severely dilated. - Right ventricle: Pacer wire or  catheter noted in right ventricle. - Right atrium: The atrium was mildly dilated. Central venous   pressure (est): 15 mm Hg. - Atrial septum: No defect or patent foramen ovale was identified. - Tricuspid valve: There was mild regurgitation. - Pulmonary arteries: PA peak pressure: 40 mm Hg (S). - Pericardium, extracardiac: A small pericardial effusion was   identified posterior to the heart. There was a right pleural   effusion.  Impressions:  - Mild LVH with LVEF approximately 35%. Hypokinesis of the mid to   distal anteroseptal wall and periapical region noted.   Indeterminate diastolic function. Severe left atrial enlargement.   Mildly calcified mitral annulus with thickened leaflets and mild   mitral regurgitation. Mild calcific aortic stenosis. Device wire   noted within the right heart.. Mild tricuspid regurgitation with   PASP estimated 40 mmHg. Small posterior pericardial effusion and   right pleural effusion noted.  BNP    Component Value Date/Time   BNP 133.0 (H) 11/21/2016 1307   BNP 122.0 (H) 04/13/2014 1405    ProBNP    Component Value Date/Time   PROBNP 5,412.0 (H) 03/06/2013 1801     Education Assessment and Provision:  Detailed education and instructions provided on heart failure disease management including the following:  Signs and symptoms of Heart Failure When to call the physician Importance of daily weights Low sodium diet Fluid restriction Medication management Anticipated future follow-up appointments  Patient education given on each of the above topics.  Patient acknowledges understanding and acceptance of all instructions.  I spoke with Ms. Backs regarding her HF and current hospitalization.  She tells me that all this is not "new" to her-she's been "dealing with it for years".  She says that she does not currently weigh everyday secondary to mobility issues -"cannot get up on scale".  She feels a lower profile scale may be beneficial and I  have provided one for home use.  I reviewed the importance of daily weights and when to contact the physician.  I also reviewed a low sodium diet and high sodium foods to avoid. She insists that she eats low sodium and reads food labels.  She denies any issues with getting or taking prescribed medications.  She will follow with [facility] after discharge.   Education Materials:  "Living Better With Heart Failure" Booklet, Daily Weight Tracker Tool    High Risk Criteria for Readmission and/or Poor Patient Outcomes:  (Recommend Follow-up with [facility])   EF <30%- no 35%  2 or more admissions in 6 months-2/29mo Difficult social situation- no  Demonstrates medication noncompliance-denies    Barriers of Care:  Knowledge and compliance  Discharge Planning:   [place]

## 2017-02-22 NOTE — Progress Notes (Signed)
[name] NOTE    Pharmacy Consult for: discharged education about Demadex   Allergies  Allergen Reactions  . Cephalexin Shortness Of Breath    sob  . Codeine Anaphylaxis    REACTION: throat swelling  . Contrast Media [Iodinated Diagnostic Agents] Other (See Comments)    Patient states "I have chronic kidney disease so the doctor said no dye in my veins."  . Coreg [Carvedilol] Other (See Comments)    Beta Blockers cause her organs to shut down per patient  . Peppermint Flavor Shortness Of Breath  . Prednisone Anaphylaxis, Shortness Of Breath and Swelling    REACTION: swelling, S.O.B.  . Tape Other (See Comments)    States plastic tape blisters her skin  . Ciprofloxacin Hives  . Latex Itching and Rash  . Penicillins Hives    Patient Measurements: Height: 5' 6"  (167.6 cm) Weight: 253 lb 9.6 oz (115 kg) IBW/kg (Calculated) : 59.3   Vital Signs: Temp: 98 F (36.7 C) (05/31 1323) Temp Source: Oral (05/31 1323) BP: 126/76 (05/31 1323) Pulse Rate: 86 (05/31 1323) Intake/Output from previous day: 05/30 0701 - 05/31 0700 In: 720 [P.O.:720] Out: 2050 [Urine:2050] Intake/Output from this shift: Total I/O In: 360 [P.O.:360] Out: 1070 [Urine:1070]  Labs:  Recent Labs  02/20/17 0558 02/21/17 0235 02/22/17 0526  CREATININE 1.97* 2.06* 2.02*   Estimated Creatinine Clearance: 32.9 mL/min (A) (by C-G formula based on SCr of 2.02 mg/dL (H)).   Microbiology: No results found for this or any previous visit (from the past 720 hour(s)).  Medical History: Past Medical History:  Diagnosis Date  . Anemia   . Atrial fibrillation ([place])   . Bursitis   . Cancer ([place])   . Cataract   . Cervical cancer ([place]) 1991   s/p hysterectomy  . Chronic cellulitis   . Chronic combined systolic and diastolic congestive heart failure, NYHA class 2 (HCC)    LVEF 25-30% with restrictive diastolic filling  . Degenerative joint disease   . Diverticulitis   . Essential  hypertension   . Gallstones   . GERD (gastroesophageal reflux disease)   . Gout   . Kidney stones   . Morbid obesity ([place])   . NICM (nonischemic cardiomyopathy) ([place]) 02/22/2017  . Osteoarthritis   . Permanent atrial fibrillation ([place]) 05/04/2009   Qualifier: History of  By: Quentin Cornwall CMA, Janett Billow    . 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   Qualifier: Diagnosis of  By: Lovena Le, MD, [facility], Binnie Kand   . 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 Lovena Le  . Sleep apnea    CPAP    Medications:  Scheduled:  . allopurinol  100 mg Oral BID  . aspirin  325 mg Oral Daily  . ferrous sulfate  325 mg Oral QPM  . heparin  5,000 Units Subcutaneous Q8H  . hydrALAZINE  12.5 mg Oral TID  . HYDROcodone-acetaminophen  1 tablet Oral TID  . isosorbide mononitrate  30 mg Oral Daily  . nystatin-triamcinolone ointment   Topical BID  . pantoprazole  40 mg Oral Daily  . potassium chloride SA  20 mEq Oral BID  . sodium chloride flush  3 mL Intravenous Q12H  . torsemide  40 mg Oral BID    Assessment: 71 y.o female to be discharged today.  Nurse requested pharmacist to discuss importance of taking Torsemide (Demadex) as prescribed due to patient would only take 1/2 dose (37m) of her Torsemide 40 mg dose this AM.  Heart failure navigator has documented detailed education and instructions provided on heart failure disease management including medications. I spoke to [name] [name] who reports she only took 1/2 dose of Torsemide this morning because she is to be discharged soon today and she would not make is home without soiling herself if she takes the whole 71m dose this morning. She plans to take the remaining 224mdose when she is within 30 minutes of her home.  I told her this was acceptable and re-enforced importance of taking the Torsemide as prescribed 40 mg twice daily and that MD also prescribing additional 4045mose daily as needed for weight gain of 2-3 lbs.   Plan:   I told  her this was acceptable today and I  re-enforced importance of taking the Torsemide as prescribed 40 mg twice daily and that MD also prescribing additional 23m46mse daily as needed for weight gain of 2-3 lbs.  Patient states she understands.   [name] [name] Clinical Pharmacist Pager: [phone] [phone] #[phone] 1:55 PM

## 2017-02-22 NOTE — Progress Notes (Addendum)
Progress Note  Patient Name: [name] [name] Date of Encounter: 02/22/2017  Primary Cardiologist:   [place]/ Lovena Le  Subjective   Denies dyspnea this AM; lower chest pain (under left ribs) continues and is reproduced with palpation  Inpatient Medications    Scheduled Meds: . allopurinol  100 mg Oral BID  . aspirin  325 mg Oral Daily  . ferrous sulfate  325 mg Oral QPM  . heparin  5,000 Units Subcutaneous Q8H  . hydrALAZINE  12.5 mg Oral TID  . HYDROcodone-acetaminophen  1 tablet Oral TID  . isosorbide mononitrate  30 mg Oral Daily  . nystatin-triamcinolone ointment   Topical BID  . pantoprazole  40 mg Oral Daily  . potassium chloride SA  20 mEq Oral BID  . sodium chloride flush  3 mL Intravenous Q12H  . torsemide  40 mg Oral BID   Continuous Infusions: . sodium chloride     PRN Meds: sodium chloride, acetaminophen, docusate sodium, Glycerin (Adult), ondansetron (ZOFRAN) IV, polyethylene glycol, senna-docusate, sodium chloride flush   Vital Signs    Vitals:   02/21/17 1150 02/21/17 2007 02/22/17 0127 02/22/17 0500  BP: 121/74 (!) 94/54  122/74  Pulse: 98 (!) 102  98  Resp: 20 19  18   Temp: 97.4 F (36.3 C) 97.7 F (36.5 C)  97.8 F (36.6 C)  TempSrc: Oral Oral  Oral  SpO2: 98% 97%  97%  Weight:   117.2 kg (258 lb 4.8 oz) 115 kg (253 lb 9.6 oz)  Height:        Intake/Output Summary (Last 24 hours) at 02/22/17 0922 Last data filed at 02/22/17 0805  Gross per 24 hour  Intake              720 ml  Output             1900 ml  Net            -1180 ml       Filed Weights   02/21/17 0359 02/22/17 0127 02/22/17 0500  Weight: 116.6 kg (257 lb) 117.2 kg (258 lb 4.8 oz) 115 kg (253 lb 9.6 oz)    Telemetry    Atrial fib  Paced   - Personally Reviewed   Physical Exam   GEN: WD/obese, NAD Neck: No JVD, supple Cardiac: irregular, mildly tachycardic Respiratory: mildly diminished BS bases GI: Soft, nontender, non-distended, no masses; pt is mildly tender  over left lower rib MS: trace edema unchanged; chronic skin changes Neuro:  grossly intact   Labs    Chemistry Recent Labs Lab 02/16/17 1339  02/20/17 0558 02/21/17 0235 02/22/17 0526  NA 140  < > 139 138 137  K 3.8  < > 3.6 3.7 3.7  CL 102  < > 95* 93* 92*  CO2 28  < > 32 35* 32  GLUCOSE 116*  < > 88 92 81  BUN 62*  < > 52* 54* 51*  CREATININE 1.82*  < > 1.97* 2.06* 2.02*  CALCIUM 9.3  < > 9.2 9.1 9.1  PROT 7.0  --   --   --   --   ALBUMIN 4.2  --   --   --   --   AST 25  --   --   --   --   ALT 19  --   --   --   --   ALKPHOS 71  --   --   --   --  BILITOT 0.8  --   --   --   --   GFRNONAA 27*  < > 24* 23* 24*  GFRAA 31*  < > 28* 27* 27*  ANIONGAP 10  < > 12 10 13   < > = values in this interval not displayed.   Hematology  Recent Labs Lab 02/16/17 1339  WBC 6.6  RBC 3.48*  HGB 10.6*  HCT 33.4*  MCV 96.0  MCH 30.5  MCHC 31.7  RDW 15.7*  PLT 124*    Patient Profile     71 y.o. female history of acute on chronic systolic CHF, permanent atrial fib, s/p PPM  Assessment & Plan    1  Acute on chronic systolic/diastolic CHF    LVEF 97%  I/O -1330 Pt much improved; will continue demadex (40 mg BID; take additional 40 mg daily for weight gain of 2-3 lbs); pt instructed on low Na diet and fluid restriction again this AM. Add metolazone 2.5 mg daily every Monday.  2  Pemanent atrial fib   CHADSVASc 5.  Pt refuses anticoagulation.  3  AVN ablation with PPM  4  CKD Stage III  Cr is unchanged; will plan bmet one week following DC.  5 NICM-continue hydralazine/nitrates; she is not on an ARB or ACEI due to renal insuff. No beta blocker due to allergy per pt.  DC home today with TOC appt with APP one week; check bmet at that time; FU with Dr Harrington Challenger 6-8 weeks; if CHF continues to be an issue, CHF clinic would be appropriate.  > 30 min PA and physician time D2  Signed, Kirk Ruths, MD  02/22/2017, 9:22 AM

## 2017-02-25 NOTE — Progress Notes (Signed)
Cardiology Office Note:    Date:  02/26/2017   ID:  [name], DOB [birth year], MRN [id number]  PCP:  [name], MD  Cardiologist:  Dr. Dorris Carnes   Electrophysiologist: Dr. Cristopher Peru    Referring MD: [name], MD   Chief Complaint  Patient presents with  . Hospitalization Follow-up    admx for CHF    History of Present Illness:    [name] [name] is a 71 y.o. 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  Her CHADS2-VASc=4 (HTN, CHF, 71 yo, female).  But, she has refused anticoagulation.     She was admitted from the office 5/25-5/31 for a/c combined systolic and diastolic congestive heart failure.  She required IV Lasix augmented with Metolazone for diuresis.  She was followed by Nephrology.  She was also seen by the CHF nurse and provided with a scale for home monitoring.     [name] returns for post hospital follow up.  She is here today with her daughter. She arrives in a wheelchair. She notes that her breathing is better. Her weights have remained stable since DC from the hospital. She denies orthopnea or PND. LE edema is improved. She denies dizziness or syncope. She has multiple other complaints including chest and back pain as well as headaches and tinnitus. She has had chronic chest pain that is brought on by palpation. This is unchanged.  Prior CV studies:   The following studies were reviewed today:  Echo 11/22/16 Mild LVH, EF 35, ant-septal and apicalHK, mild aortic stenosis, MAC, mild MR, severe LAE, mild RAE, mild TR, PASP 40, small pericardial effusion, R pleural effusion  Renal Art [place] 08/2014 Limited visualization; no obvious RA stenosis  LHC 06/2008 Normal coronary angiography   Past Medical History:  Diagnosis Date  . Anemia   . Atrial fibrillation ([place])   . Bursitis   . Cancer  ([place])   . Cataract   . Cervical cancer ([place]) 1991   s/p hysterectomy  . Chronic cellulitis   . Chronic combined systolic and diastolic congestive heart failure, NYHA class 2 (HCC)    LVEF 25-30% with restrictive diastolic filling  . Degenerative joint disease   . Diverticulitis   . Essential hypertension   . Gallstones   . GERD (gastroesophageal reflux disease)   . Gout   . Kidney stones   . Morbid obesity ([place])   . NICM (nonischemic cardiomyopathy) ([place]) 02/22/2017  . Osteoarthritis   . Permanent atrial fibrillation ([place]) 05/04/2009   Qualifier: History of  By: Quentin Cornwall CMA, Janett Billow    . 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 Sinoatrial node dysfunction [facility])    Status post PPM - Dr. Lovena Le  . Sleep apnea    CPAP    Past Surgical History:  Procedure Laterality Date  . ABDOMINAL HYSTERECTOMY  1991  . COLONOSCOPY  1998   one polyp per patient  . EYE SURGERY    . PACEMAKER INSERTION  Nov 2000   St Jude with revision in 2011  . PERCUTANEOUS NEPHROLITHOTOMY  April 2012    Current Medications: Current Meds  Medication Sig  . allopurinol (ZYLOPRIM) 100 MG tablet Take 100 mg by mouth 2 (two) times daily.  Marland Kitchen aspirin 325 MG tablet Take 325 mg  by mouth daily.    . calcitRIOL (ROCALTROL) 0.25 MCG capsule Take 0.25 mcg by mouth every other day.   . calcium carbonate (OS-CAL) 600 MG tablet Take 600 mg by mouth daily.  . COD LIVER OIL PO Take 1 tablet by mouth daily.    Marland Kitchen docusate sodium (COLACE) 100 MG capsule Take 100 mg by mouth daily as needed for mild constipation.  . ferrous sulfate 325 (65 FE) MG tablet Take 325 mg by mouth every evening.   . Glycerin, Adult, 2.1 g SUPP Place 1 suppository rectally daily as needed for moderate constipation.  . hydrALAZINE (APRESOLINE) 25 MG tablet Take 0.5 tablets (12.5 mg total) by mouth 3 (three) times daily.  Marland Kitchen HYDROcodone-acetaminophen (NORCO) 10-325 MG per tablet  Take 1 tablet by mouth 2 (two) times daily.   . isosorbide mononitrate (IMDUR) 30 MG 24 hr tablet Take 1 tablet (30 mg total) by mouth daily.  . metolazone (ZAROXOLYN) 2.5 MG tablet Take 1 tablet (2.5 mg total) by mouth once a week. Take on Tuesdays, take 30 minutes before the torsemide/Demadex  . Multiple Vitamin (MULTIVITAMIN) tablet Take 1 tablet by mouth daily.  Marland Kitchen nystatin (MYCOSTATIN/NYSTOP) powder Apply 1 application topically every other day.  . nystatin-triamcinolone ointment (MYCOLOG) Apply topically 2 (two) times daily.  Marland Kitchen omeprazole (PRILOSEC) 20 MG capsule Take 20 mg by mouth daily.    . potassium chloride SA (K-DUR,KLOR-CON) 20 MEQ tablet Take 1 tablet (20 mEq total) by mouth 2 (two) times daily.  Marland Kitchen torsemide (DEMADEX) 20 MG tablet Take 2 tablets (40 mg total) by mouth 2 (two) times daily. OK to take an additional 40 mg daily prn for weight gain.  . vitamin B-12 (CYANOCOBALAMIN) 500 MCG tablet Take 500 mcg by mouth daily.     Allergies:   Cephalexin; Codeine; Contrast media [iodinated diagnostic agents]; Coreg [carvedilol]; Peppermint flavor; Prednisone; Tape; Ciprofloxacin; Latex; and Penicillins   Social History   Social History  . Marital status: Widowed    Spouse name: N/A  . Number of children: N/A  . Years of education: N/A   Social History Main Topics  . Smoking status: Former Smoker    Packs/day: 1.00    Types: Cigarettes    Quit date: 09/26/1979  . Smokeless tobacco: Never Used  . Alcohol use No  . Drug use: No  . Sexual activity: No   Other Topics Concern  . None   Social History Narrative  . None     Family Hx: The patient's family history includes Colon cancer in her other; Diabetes in her sister; Heart attack in her father; Stroke in her mother. There is no history of Liver disease or Other.  ROS:   Please see the history of present illness.    Review of Systems  Cardiovascular: Positive for chest pain, irregular heartbeat and leg swelling.    Respiratory: Positive for cough.   Hematologic/Lymphatic: Bruises/bleeds easily.  Musculoskeletal: Positive for back pain, joint swelling and myalgias.  Gastrointestinal: Positive for abdominal pain.  Neurological: Positive for headaches and loss of balance.   All other systems reviewed and are negative.   EKGs/Labs/Other Test Reviewed:    EKG:  EKG is not ordered today.  The ekg ordered today demonstrates n/a  Recent Labs: 08/15/2016: Hemoglobin 10.9 11/21/2016: B Natriuretic Peptide 133.0 11/25/2016: Magnesium 2.2 02/16/2017: ALT 19; Hemoglobin 10.6; Platelets 124; TSH 4.274 02/26/2017: BUN 63; Creatinine, Ser 1.96; Potassium 4.0; Sodium 139   Recent Lipid Panel Lab Results  Component Value Date/Time  CHOL 142 12/14/2014 03:06 PM   TRIG 141.0 12/14/2014 03:06 PM   HDL 45.90 12/14/2014 03:06 PM   CHOLHDL 3 12/14/2014 03:06 PM   LDLCALC 68 12/14/2014 03:06 PM    Physical Exam:    VS:  BP 110/60   Pulse 98   Ht _0  (1.676 m)   Wt 257 lb 12.8 oz (116.9 kg)   BMI 41.61 kg/m     Wt Readings from Last 3 Encounters:  02/26/17 257 lb 12.8 oz (116.9 kg)  02/22/17 253 lb 9.6 oz (115 kg)  02/16/17 286 lb (129.7 kg)     Physical Exam  Constitutional: She is oriented to person, place, and time. She appears well-developed and well-nourished. No distress.  HENT:  Head: Normocephalic and atraumatic.  Eyes: No scleral icterus.  Neck: Normal range of motion. No JVD (no JVD at 90 degrees) present.  Cardiovascular: Normal rate, regular rhythm, S1 normal and S2 normal.   Murmur heard.  Harsh systolic murmur is present with a grade of 2/6  at the upper left sternal border Pulmonary/Chest: She has decreased breath sounds. She has no wheezes. She has no rhonchi. She has no rales.  Abdominal: Soft. There is no tenderness.  Musculoskeletal: She exhibits edema (trace bilat LE edema).  Neurological: She is alert and oriented to person, place, and time.  Skin: Skin is warm and dry.   Psychiatric: She has a normal mood and affect.    ASSESSMENT:    1. Chronic combined systolic and diastolic CHF (congestive heart failure) ([place])   2. NICM (nonischemic cardiomyopathy) ([place])   3. Permanent atrial fibrillation ([place])   4. CKD stage G3b/A1, GFR 30-44 and albumin creatinine ratio <30 mg/g   5. Liver cirrhosis secondary to NASH ([place])   6. 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 Tinnitus, unspecified laterality    PLAN:    In order of problems listed above:  1. Chronic combined systolic and diastolic CHF (congestive heart failure) (HCC) -  Volume remains stable post DC.  Her weights have not changed.  She notes improved breathing.  She has clear lungs and flat neck veins on exam.  BP has been low at home and she is not sure if she can tolerate the current dose of hydralazine and nitrates.  -  Continue current dose of Torsemide and Metolazone.  -  BMET today  -  Decrease Hydralazine to Twice daily   -  Keep follow up with Dr. Dorris Carnes in July  2. NICM (nonischemic cardiomyopathy) ([place]) She is on hydralazine and nitrates given hx of CKD.  She refuses beta-blocker Rx.  3. Permanent atrial fibrillation (HCC) -  S/p AVN ablation and PPM.  She refuses Coumadin. She has been on ASA 325 mg. We discussed the lack of benefit with 325 mg vs 81 mg.  I will defer this to Dr. Dorris Carnes.   4. CKD stage G3b/A1, GFR 30-44 and albumin creatinine ratio <30 mg/g -  FU with Deterding as planned. Check BMET today.   5. Liver cirrhosis secondary to NASH [facility]) - FU with Dr. Oneida Alar as planned.   6. 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   7. Tinnitus, unspecified laterality - I have asked her to FU with her PCP to discuss as well as her other non-cardiac complaints.    Dispo:  Return in about 5 weeks (around 04/05/2017) for Scheduled Follow Up, w/ Dr. Harrington Challenger.   Medication Adjustments/Labs and Tests Ordered:  Current medicines are reviewed at length with the  patient today.  Concerns regarding medicines are outlined above.  Orders/Tests:  Orders Placed This Encounter  Procedures  . Basic metabolic panel   Medication changes: No orders of the defined types were placed in this encounter.  Signed, Richardson Dopp, PA-C  02/26/2017 5:40 PM    [facility] [address], [place], [place]  [address] Phone: [phone]; Fax: [phone]

## 2017-02-26 ENCOUNTER — Ambulatory Visit (INDEPENDENT_AMBULATORY_CARE_PROVIDER_SITE_OTHER): Payer: Medicare Other | Admitting: Physician Assistant

## 2017-02-26 ENCOUNTER — Encounter: Payer: Self-pay | Admitting: Physician Assistant

## 2017-02-26 VITALS — BP 110/60 | HR 98 | Ht 66.0 in | Wt 257.8 lb

## 2017-02-26 DIAGNOSIS — K7460 Unspecified cirrhosis of liver: Secondary | ICD-10-CM | POA: Diagnosis not present

## 2017-02-26 DIAGNOSIS — I428 Other cardiomyopathies: Secondary | ICD-10-CM | POA: Diagnosis not present

## 2017-02-26 DIAGNOSIS — I4821 Permanent atrial fibrillation: Secondary | ICD-10-CM

## 2017-02-26 DIAGNOSIS — N183 Chronic kidney disease, stage 3 (moderate): Secondary | ICD-10-CM | POA: Diagnosis not present

## 2017-02-26 DIAGNOSIS — K7581 Nonalcoholic steatohepatitis (NASH): Secondary | ICD-10-CM | POA: Diagnosis not present

## 2017-02-26 DIAGNOSIS — I5042 Chronic combined systolic (congestive) and diastolic (congestive) heart failure: Secondary | ICD-10-CM | POA: Diagnosis not present

## 2017-02-26 DIAGNOSIS — I482 Chronic atrial fibrillation: Secondary | ICD-10-CM

## 2017-02-26 DIAGNOSIS — H9319 Tinnitus, unspecified ear: Secondary | ICD-10-CM

## 2017-02-26 DIAGNOSIS — Z950 Presence of cardiac pacemaker: Secondary | ICD-10-CM

## 2017-02-26 DIAGNOSIS — N1832 Chronic kidney disease, stage 3b: Secondary | ICD-10-CM

## 2017-02-26 LAB — BASIC METABOLIC PANEL
BUN / CREAT RATIO: 32 — AB (ref 12–28)
BUN: 63 mg/dL — AB (ref 8–27)
CHLORIDE: 91 mmol/L — AB (ref 96–106)
CO2: 29 mmol/L (ref 18–29)
Calcium: 10.3 mg/dL (ref 8.7–10.3)
Creatinine, Ser: 1.96 mg/dL — ABNORMAL HIGH (ref 0.57–1.00)
GFR calc Af Amer: 29 mL/min/{1.73_m2} — ABNORMAL LOW (ref >59)
GFR calc non Af Amer: 25 mL/min/{1.73_m2} — ABNORMAL LOW (ref >59)
GLUCOSE: 89 mg/dL (ref 65–99)
POTASSIUM: 4 mmol/L (ref 3.5–5.2)
SODIUM: 139 mmol/L (ref 134–144)

## 2017-02-26 NOTE — Patient Instructions (Addendum)
Your physician has recommended you make the following change in your medication: DECREASE  HYDRALAZINE  12.5 MG TO  [place] physician recommends that you return for lab work in:  [place] physician recommends that you schedule a follow-up appointment in:  AS   PLANNED

## 2017-02-27 ENCOUNTER — Telehealth: Payer: Self-pay | Admitting: *Deleted

## 2017-02-27 NOTE — Telephone Encounter (Signed)
-----   Message from Liliane Shi, [place] sent at 02/26/2017  9:58 PM EDT ----- Please call the patient Kidney function is stable.  The potassium is normal. Repeat BMET in 1 week. Richardson Dopp, PA-C    02/26/2017 9:58 PM

## 2017-02-27 NOTE — Telephone Encounter (Signed)
Pt has been notified of lab results, findings and plan of care to repeat bmet in 1 week. Pt is agreeable to all instructions and plan of care . Pt lives in [place] and would like to have lab work done at [facility]. I will mail Lab Rx to pt today. I verified her address. I have placed on Rx for the results to be faxed to Richardson Dopp, [place]. Pt thanked me for my call today.

## 2017-02-28 ENCOUNTER — Ambulatory Visit (HOSPITAL_COMMUNITY)
Admission: RE | Admit: 2017-02-28 | Discharge: 2017-02-28 | Disposition: A | Discharge status: Home / Self Care | Payer: Medicare Other | Source: Ambulatory Visit | Attending: Gastroenterology | Admitting: Gastroenterology

## 2017-02-28 ENCOUNTER — Ambulatory Visit: Payer: Medicare Other | Admitting: Physician Assistant

## 2017-02-28 DIAGNOSIS — R1310 Dysphagia, unspecified: Secondary | ICD-10-CM | POA: Insufficient documentation

## 2017-02-28 DIAGNOSIS — R4702 Dysphasia: Secondary | ICD-10-CM | POA: Diagnosis not present

## 2017-03-05 ENCOUNTER — Telehealth: Payer: Self-pay | Admitting: Internal Medicine

## 2017-03-05 NOTE — Telephone Encounter (Signed)
I spoke with patient.  Her weight at home had increased after seeing Richardson Dopp, PA-C last week.  She states shew as instructed to increase evening dose of lasix to 60 mg for 3 days if her weight increased by 3 pounds.  On 02/28/17 she weighed 254.8 and on 03/01/17 she weighed 258.4.  From 6/7 through 6/10 she took 40 in AM and 60 in PM.  Her weight has only decreased by 1 pound (weighed 257 today)  Having repeat BMET tomorrow.  Takes metolazone 2.5 mg weekly (on Tuesdays).  She thinks she is going to need to stay on the 40/60 because it took 4 days for her weight to go down one pound.   She will continue daily weights and knows we will contact her once her labs are reviewed.  She is aware I am forwarding to [facility] and Dr. Harrington Challenger for review.  If her weight is above 257 tomorrow she is planning to do the 40 in AM and 60 in PM for 3 additional days.  Her breathing is stable, her only real symptoms that she is noticing is her weight change.

## 2017-03-05 NOTE — Telephone Encounter (Signed)
New message   Pt verbalized that she has increased in weight she was 254.8 and went to 257  And she wants rn to call her back to see if medications need to be adjusted

## 2017-03-05 NOTE — Telephone Encounter (Signed)
We have that she is taking Torsemide 40 mg Twice daily, potassium 20 mEq Twice daily and Metolazone 2.5 mg once a week.  PLAN:  Take Metolazone tomorrow as planned. Take Torsemide 60 mg in A and 40 mg in P on Mon, Wed, Fri and continue on Torsemide 40 mg Twice daily all other days. BMET 1 week. Richardson Dopp, PA-C    03/05/2017 5:48 PM

## 2017-03-06 ENCOUNTER — Other Ambulatory Visit: Payer: Self-pay | Admitting: Physician Assistant

## 2017-03-06 DIAGNOSIS — I5042 Chronic combined systolic (congestive) and diastolic (congestive) heart failure: Secondary | ICD-10-CM | POA: Diagnosis not present

## 2017-03-07 ENCOUNTER — Telehealth: Payer: Self-pay | Admitting: *Deleted

## 2017-03-07 ENCOUNTER — Telehealth: Payer: Self-pay | Admitting: Gastroenterology

## 2017-03-07 LAB — BASIC METABOLIC PANEL
BUN/Creatinine Ratio: 35 — ABNORMAL HIGH (ref 12–28)
BUN: 82 mg/dL — AB (ref 8–27)
CALCIUM: 9.9 mg/dL (ref 8.7–10.3)
CHLORIDE: 91 mmol/L — AB (ref 96–106)
CO2: 31 mmol/L — AB (ref 20–29)
Creatinine, Ser: 2.32 mg/dL — ABNORMAL HIGH (ref 0.57–1.00)
GFR calc Af Amer: 24 mL/min/{1.73_m2} — ABNORMAL LOW (ref >59)
GFR calc non Af Amer: 20 mL/min/{1.73_m2} — ABNORMAL LOW (ref >59)
Glucose: 80 mg/dL (ref 65–99)
POTASSIUM: 4.8 mmol/L (ref 3.5–5.2)
Sodium: 138 mmol/L (ref 134–144)

## 2017-03-07 MED ORDER — TORSEMIDE 20 MG PO TABS: 40.0000 mg | ORAL_TABLET | Freq: Two times a day (BID) | ORAL | 3 refills | Status: DC

## 2017-03-07 MED ORDER — METOLAZONE 2.5 MG PO TABS: 2.5000 mg | ORAL_TABLET | Freq: Every day | ORAL | 3 refills | Status: DC

## 2017-03-07 NOTE — Telephone Encounter (Signed)
-----   Message from Liliane Shi, PA-C sent at 03/07/2017 12:11 PM EDT ----- Labs 03/06/17: Na 138, K 4.8, Cl 91, CO2 31, BUN 82, Creatinine 2.32 There is a phone note from 03/05/17.  Please see that.  She was taking Torsemide 40 in A and 60 in P for several days for increased weight. Prior to getting these labs, I suggested she take Torsemide 60 in A and 40 in P on Mon, Wed, Fri and 40 bid all other days. She also takes Metolazone once a week. But, these labs suggest she is over-diuresed.  Renal function is worse. PLAN:  1. Hold Metolazone x 1 week, then resume as usual. 2. Hold PM dose of Torsemide x 1 3. Change Torsemide dose back to 40 mg Twice daily  4. Do not adjust Torsemide - call us if weight or swelling increases. 5. BMET 1 week Richardson Dopp, PA-C    03/07/2017 12:11 PM

## 2017-03-07 NOTE — Telephone Encounter (Signed)
Lmtcb to go over lab results/findings and medication changes. I tried the [phone] # as well though recording says call cannot be completed. I lmtcb on [phone].

## 2017-03-07 NOTE — Telephone Encounter (Signed)
Please call pt. HER study shows NORMAL SWALLOWING AND NO EVIDENCE OF A STRICTURE. IF YOU CONTINUE TO HAVE DIFFICULTY SWALLOWING PLEASE LET us KNOW AND WE WILL REFER YOU TO NEUROLOGY FOR ADDITIONAL WORKUP.

## 2017-03-07 NOTE — Telephone Encounter (Signed)
Pt has been notified of lab results/findings by phone with verbal understanding. Pt advised of medication changes: 1. HOLD PM DOSE OF TORSEMIDE TONIGHT  2. RESUME TORSEMIDE 40 MG BID 03/08/17  3. HOLD METOLAZONE X 1 WEEK; AFTER 1 WEEK         MAY RESUME AT HER CURRENT DOSE  4. PT AWARE DO NOT ADJUST TORSEMIDE- CALL         THE OFFICE [phone] TO LET SCOTT     WEAVER, PAC KNOW IF WEIGHT IS UP 3 LB'S X 1     DAY OR INCREASE SOB, INCREASED EDEMA.  5. BMET WILL BE DONE NEXT WEEK AT [facility] [place] WITH THE RESULTS TO BE FAXED TO [place], [place]  Pt is agreeable to plan of care with verbal understanding to all instructions with read back x 2.

## 2017-03-08 NOTE — Telephone Encounter (Signed)
Patient made aware.

## 2017-03-16 ENCOUNTER — Other Ambulatory Visit: Payer: Self-pay | Admitting: Physician Assistant

## 2017-03-16 ENCOUNTER — Telehealth: Payer: Self-pay | Admitting: Internal Medicine

## 2017-03-16 ENCOUNTER — Encounter: Payer: Self-pay | Admitting: Internal Medicine

## 2017-03-16 DIAGNOSIS — I5042 Chronic combined systolic (congestive) and diastolic (congestive) heart failure: Secondary | ICD-10-CM | POA: Diagnosis not present

## 2017-03-16 NOTE — Telephone Encounter (Signed)
New Message  Pt call requesting to speak with RN. Pt states she has had some weight gain and would like to speak with RN. Please callback to discuss  Weight gain Wednesday 260lbs, Thursday 262lbs, Friday 263lbs

## 2017-03-16 NOTE — Telephone Encounter (Signed)
Spoke with patient who is concerned with her weight increase this week.  She is slightly more SOB than normal but the weight is her main concern.  Pt has held  metolazone as instructed by Richardson Dopp, APP  She last took it on 6/12, skipped this Tues and would be due for it next Tuesday.    I reviewed with Dr. Harrington Challenger who states we cannot make medicine changes until we see her labs.   I explained this to patient and advised her to continue to weigh over the weekend, we will contact her Monday after reviewing her labs.  Also advised to limit her fluid intake.  She told me she drinks between 80-100 oz per day.  And that Nicki Reaper told her to drink one liter per day but she didn't know how many ounces that was.  I advised to drink between 30-60 oz per day unless she is told otherwise.  Advised that a liter is equal to about 33 ounces.   Advised that if her breathing becomes worsens she needs to go to the ER over the weekend.  She verbalizes understanding.

## 2017-03-17 LAB — BASIC METABOLIC PANEL
BUN / CREAT RATIO: 28 (ref 12–28)
BUN: 73 mg/dL — AB (ref 8–27)
CO2: 27 mmol/L (ref 20–29)
CREATININE: 2.62 mg/dL — AB (ref 0.57–1.00)
Calcium: 9.8 mg/dL (ref 8.7–10.3)
Chloride: 95 mmol/L — ABNORMAL LOW (ref 96–106)
GFR calc non Af Amer: 18 mL/min/{1.73_m2} — ABNORMAL LOW (ref >59)
GFR, EST AFRICAN AMERICAN: 20 mL/min/{1.73_m2} — AB (ref >59)
Glucose: 93 mg/dL (ref 65–99)
Potassium: 4.8 mmol/L (ref 3.5–5.2)
Sodium: 138 mmol/L (ref 134–144)

## 2017-03-17 LAB — AMBIG ABBREV BMP8 DEFAULT

## 2017-03-19 NOTE — Telephone Encounter (Signed)
[facility] for results of labs from 03/16/17.    Results will be faxed to [phone].  Verbally reported BUN is 73 and CREAT is 2.62, K+ is 4.8.    Awaiting printed copy so that further recommendations can be made.

## 2017-03-20 NOTE — Telephone Encounter (Signed)
I have not seen labs from [place] in office to assist.

## 2017-03-20 NOTE — Telephone Encounter (Signed)
Labs scanned into EPIC. Called patient for update. Weight is between 262-263--same as last week. Breathing has not changed.  She is at her baseline. Took metolazone 2.5 mg this am and torsemide 40 mg 45 minutes after that.  Decreased fluid intake.  24th, 36 oz, 25th 60 oz.  Her urine output has increased because she has been resting more with her legs elevated.  No new complaints/concerns.  I advised if there are any changes based on her blood work when Dr. Harrington Challenger reviews I will call her back.

## 2017-03-20 NOTE — Telephone Encounter (Signed)
Per Dr. Harrington Challenger patient needs to stop metolazone, hold torsemide tomorrow then resume.  Patient informed.  She verbalizes understanding.

## 2017-03-29 ENCOUNTER — Telehealth: Payer: Self-pay | Admitting: Internal Medicine

## 2017-03-29 DIAGNOSIS — N289 Disorder of kidney and ureter, unspecified: Secondary | ICD-10-CM

## 2017-03-29 NOTE — Telephone Encounter (Signed)
Spoke with Margarita Grizzle at [facility] in [place], and she was unable to receive lab order faxed earlier to them on this pt.  Per Margarita Grizzle, she is only receiving the fax cover sheet, with no lab order attached.  Asked Margarita Grizzle if she is able to see this in their computer system, for this is placed under [facility], lab collect, for them to do.  Per Margarita Grizzle, she can visualize the BMET order to draw on this pt for tomorrow.  Per Margarita Grizzle, she will no longer need [place] to refax this order again. Margarita Grizzle was gracious for all the assistance provided.

## 2017-03-29 NOTE — Telephone Encounter (Signed)
Pt of Dr. Harrington Challenger Calling w report of weight gain of 7 lbs over approx 8-9 days.  Patient states last dose of metolazone taken on 6/26, but as advised previously, she was supposed to stop taking this medication. She was taking this 1 time a week - has not had a dose this week. She had held torsemide x1 day then resumed, also per Dr. Harrington Challenger instruction.  On 6/26 pt weight 262, down to 260 the next day. Since then, she's gained ~1 lb per day. Today pt weight is 267.  Reports no increase in fatigue. Denies obvious leg swelling. Denies SOB at rest or orthopnea but then states "maybe a little bit short of breath in the morning" before she gets out of bed. Advised pt I would need to verify w MD whether OK to resume the metolazone - for now recommended continue current med regimen, will have RN follow up later today.

## 2017-03-29 NOTE — Telephone Encounter (Signed)
Spoke with pt, she will take an extra torsemide this morning. She will go to the [facility] in [place] tomorrow for repeat BMP. She has a follow up appointment with dr Harrington Challenger next week. Lab orders faxed to [place] @ [phone].

## 2017-03-29 NOTE — Telephone Encounter (Signed)
[name] [name] is calling because she was told to stop her fluid pill on 03/20/17 and was 260 . She had already taken her fluid pill . On 03/21/17 she was 03/21/17 she was 262, 03/22/17 she was 263, and on 03/23/17 she gained another pound and she was 264. Today she is 267lbs . Please Call

## 2017-03-30 DIAGNOSIS — N289 Disorder of kidney and ureter, unspecified: Secondary | ICD-10-CM | POA: Diagnosis not present

## 2017-03-31 LAB — BASIC METABOLIC PANEL
BUN/Creatinine Ratio: 31 — ABNORMAL HIGH (ref 12–28)
BUN: 61 mg/dL — ABNORMAL HIGH (ref 8–27)
CALCIUM: 9.4 mg/dL (ref 8.7–10.3)
CO2: 28 mmol/L (ref 20–29)
CREATININE: 1.97 mg/dL — AB (ref 0.57–1.00)
Chloride: 98 mmol/L (ref 96–106)
GFR calc Af Amer: 29 mL/min/{1.73_m2} — ABNORMAL LOW (ref >59)
GFR, EST NON AFRICAN AMERICAN: 25 mL/min/{1.73_m2} — AB (ref >59)
GLUCOSE: 109 mg/dL — AB (ref 65–99)
POTASSIUM: 4.3 mmol/L (ref 3.5–5.2)
SODIUM: 141 mmol/L (ref 134–144)

## 2017-04-02 ENCOUNTER — Inpatient Hospital Stay (HOSPITAL_COMMUNITY)
Admission: EM | Admit: 2017-04-02 | Discharge: 2017-04-07 | DRG: 291 | Disposition: A | Discharge status: Home / Self Care | Payer: Medicare Other | Attending: Internal Medicine | Admitting: Internal Medicine

## 2017-04-02 ENCOUNTER — Emergency Department (HOSPITAL_COMMUNITY): Payer: Medicare Other

## 2017-04-02 ENCOUNTER — Telehealth: Payer: Self-pay | Admitting: Internal Medicine

## 2017-04-02 DIAGNOSIS — I5033 Acute on chronic diastolic (congestive) heart failure: Secondary | ICD-10-CM | POA: Diagnosis not present

## 2017-04-02 DIAGNOSIS — I482 Chronic atrial fibrillation: Secondary | ICD-10-CM | POA: Diagnosis present

## 2017-04-02 DIAGNOSIS — I4821 Permanent atrial fibrillation: Secondary | ICD-10-CM | POA: Diagnosis present

## 2017-04-02 DIAGNOSIS — Z885 Allergy status to narcotic agent status: Secondary | ICD-10-CM | POA: Diagnosis not present

## 2017-04-02 DIAGNOSIS — Z91041 Radiographic dye allergy status: Secondary | ICD-10-CM | POA: Diagnosis not present

## 2017-04-02 DIAGNOSIS — N184 Chronic kidney disease, stage 4 (severe): Secondary | ICD-10-CM | POA: Diagnosis present

## 2017-04-02 DIAGNOSIS — K7460 Unspecified cirrhosis of liver: Secondary | ICD-10-CM | POA: Diagnosis not present

## 2017-04-02 DIAGNOSIS — Z881 Allergy status to other antibiotic agents status: Secondary | ICD-10-CM | POA: Diagnosis not present

## 2017-04-02 DIAGNOSIS — I428 Other cardiomyopathies: Secondary | ICD-10-CM

## 2017-04-02 DIAGNOSIS — Z8541 Personal history of malignant neoplasm of cervix uteri: Secondary | ICD-10-CM | POA: Diagnosis not present

## 2017-04-02 DIAGNOSIS — Z950 Presence of cardiac pacemaker: Secondary | ICD-10-CM | POA: Diagnosis present

## 2017-04-02 DIAGNOSIS — D631 Anemia in chronic kidney disease: Secondary | ICD-10-CM | POA: Diagnosis present

## 2017-04-02 DIAGNOSIS — K219 Gastro-esophageal reflux disease without esophagitis: Secondary | ICD-10-CM | POA: Diagnosis present

## 2017-04-02 DIAGNOSIS — Z7982 Long term (current) use of aspirin: Secondary | ICD-10-CM | POA: Diagnosis not present

## 2017-04-02 DIAGNOSIS — Z87891 Personal history of nicotine dependence: Secondary | ICD-10-CM | POA: Diagnosis not present

## 2017-04-02 DIAGNOSIS — Z880 Allergy status to penicillin: Secondary | ICD-10-CM

## 2017-04-02 DIAGNOSIS — I4891 Unspecified atrial fibrillation: Secondary | ICD-10-CM | POA: Diagnosis not present

## 2017-04-02 DIAGNOSIS — K7581 Nonalcoholic steatohepatitis (NASH): Secondary | ICD-10-CM | POA: Diagnosis present

## 2017-04-02 DIAGNOSIS — M109 Gout, unspecified: Secondary | ICD-10-CM | POA: Diagnosis present

## 2017-04-02 DIAGNOSIS — D696 Thrombocytopenia, unspecified: Secondary | ICD-10-CM | POA: Diagnosis present

## 2017-04-02 DIAGNOSIS — Z888 Allergy status to other drugs, medicaments and biological substances status: Secondary | ICD-10-CM | POA: Diagnosis not present

## 2017-04-02 DIAGNOSIS — I130 Hypertensive heart and chronic kidney disease with heart failure and stage 1 through stage 4 chronic kidney disease, or unspecified chronic kidney disease: Principal | ICD-10-CM | POA: Diagnosis present

## 2017-04-02 DIAGNOSIS — Z79899 Other long term (current) drug therapy: Secondary | ICD-10-CM

## 2017-04-02 DIAGNOSIS — Z6841 Body Mass Index (BMI) 40.0 and over, adult: Secondary | ICD-10-CM

## 2017-04-02 DIAGNOSIS — I5043 Acute on chronic combined systolic (congestive) and diastolic (congestive) heart failure: Secondary | ICD-10-CM | POA: Diagnosis not present

## 2017-04-02 DIAGNOSIS — G4733 Obstructive sleep apnea (adult) (pediatric): Secondary | ICD-10-CM | POA: Diagnosis present

## 2017-04-02 DIAGNOSIS — I509 Heart failure, unspecified: Secondary | ICD-10-CM | POA: Diagnosis not present

## 2017-04-02 DIAGNOSIS — I10 Essential (primary) hypertension: Secondary | ICD-10-CM | POA: Diagnosis present

## 2017-04-02 DIAGNOSIS — N189 Chronic kidney disease, unspecified: Secondary | ICD-10-CM | POA: Diagnosis not present

## 2017-04-02 DIAGNOSIS — N185 Chronic kidney disease, stage 5: Secondary | ICD-10-CM | POA: Diagnosis present

## 2017-04-02 LAB — CBC
HEMATOCRIT: 33.6 % — AB (ref 36.0–46.0)
Hemoglobin: 10.8 g/dL — ABNORMAL LOW (ref 12.0–15.0)
MCH: 31 pg (ref 26.0–34.0)
MCHC: 32.1 g/dL (ref 30.0–36.0)
MCV: 96.6 fL (ref 78.0–100.0)
Platelets: 122 10*3/uL — ABNORMAL LOW (ref 150–400)
RBC: 3.48 MIL/uL — ABNORMAL LOW (ref 3.87–5.11)
RDW: 15.1 % (ref 11.5–15.5)
WBC: 5.7 10*3/uL (ref 4.0–10.5)

## 2017-04-02 LAB — BRAIN NATRIURETIC PEPTIDE: B NATRIURETIC PEPTIDE 5: 160 pg/mL — AB (ref 0.0–100.0)

## 2017-04-02 LAB — I-STAT TROPONIN, ED: TROPONIN I, POC: 0.02 ng/mL (ref 0.00–0.08)

## 2017-04-02 LAB — BASIC METABOLIC PANEL
ANION GAP: 9 (ref 5–15)
BUN: 48 mg/dL — AB (ref 6–20)
CALCIUM: 9.5 mg/dL (ref 8.9–10.3)
CO2: 29 mmol/L (ref 22–32)
Chloride: 101 mmol/L (ref 101–111)
Creatinine, Ser: 1.91 mg/dL — ABNORMAL HIGH (ref 0.44–1.00)
GFR calc Af Amer: 29 mL/min — ABNORMAL LOW (ref >60)
GFR, EST NON AFRICAN AMERICAN: 25 mL/min — AB (ref >60)
GLUCOSE: 93 mg/dL (ref 65–99)
POTASSIUM: 4 mmol/L (ref 3.5–5.1)
SODIUM: 139 mmol/L (ref 135–145)

## 2017-04-02 MED ORDER — FUROSEMIDE 10 MG/ML IJ SOLN
80.0000 mg | Freq: Once | INTRAMUSCULAR | Status: AC
  Administered 2017-04-02: 80 mg via INTRAVENOUS
  Filled 2017-04-02: qty 8

## 2017-04-02 MED ORDER — HYDROCODONE-ACETAMINOPHEN 5-325 MG PO TABS
1.0000 | ORAL_TABLET | Freq: Once | ORAL | Status: AC
  Administered 2017-04-02: 1 via ORAL
  Filled 2017-04-02: qty 1

## 2017-04-02 NOTE — Care Management (Addendum)
Patient presented to [facility] with c/o SOB, Leg swellling, and CP, hx of CHF.  Patient was admitted back in May 2018 with similar issues.. ED CM met with patient and daughter Naela Nodal [phone] primary caretaker at bedside to discuss transitional care planning. Discussed [place] services patient declines [place] services.  She states she has had  [place] services in the past and thinks she can manage without HH.  Patient's PCP Dr. Dorthy Cooler, utilizes [facility]. CM made patient away, if she has a change of mind she can let the Unit Case Manager know. CM will continue to follow patient for any transitional care needs.

## 2017-04-02 NOTE — ED Notes (Signed)
Cardiology at bedside.

## 2017-04-02 NOTE — Telephone Encounter (Signed)
I spoke with patient.  Pt states she has 6 out of 10 chest pressure off and on since yesterday, pt states her arms go numb when she is sitting still. Pt states she has increase in  lower extremity edema, denies weight gain, feels like she is retaining fluid.   I reviewed with Dr Ross--per Dr Ross-pt should go to [facility] ED for further evaluation. Pt agreed with this plan

## 2017-04-02 NOTE — ED Provider Notes (Signed)
[facility] DEPT Provider Note   CSN: [id number] Arrival date & time: 04/02/17  1338     History   Chief Complaint Chief Complaint  Patient presents with  . Shortness of Breath  . Leg Swelling  . Chest Pain    HPI [name] [name] is a 71 y.o. female.  Patient with increasing shortness of breath and increased swelling of her legs since Saturday. Always is consistent with worsening of her congestive heart failure. Patient most recently admitted in May for this. Patient is followed by Dr. Harrington Challenger from cardiology. Patient also has a pacemaker and is followed by Dr. Lovena Le for that. Patient had difficulties with atrial fibrillation underwent ablation. That's the reason for the pacemaker. Patient is not on blood thinners. Patient is on Demadex for her CHF. Patient also has chronic back pain. She takes hydrocodone for that. Patient's had some intermittent chest pain since Saturday associated with the shortness of breath. Patient uses BiPAP to sleep with. Patient feels as if she is volume overloaded and is here she is in congestive heart failure.      Past Medical History:  Diagnosis Date  . Anemia   . Atrial fibrillation ([place])   . Bursitis   . Cancer ([place])   . Cataract   . Cervical cancer ([place]) 1991   s/p hysterectomy  . Chronic cellulitis   . Chronic combined systolic and diastolic congestive heart failure, NYHA class 2 (HCC)    LVEF 25-30% with restrictive diastolic filling  . Degenerative joint disease   . Diverticulitis   . Essential hypertension   . Gallstones   . GERD (gastroesophageal reflux disease)   . Gout   . Kidney stones   . Morbid obesity ([place])   . NICM (nonischemic cardiomyopathy) ([place]) 02/22/2017  . Osteoarthritis   . Permanent atrial fibrillation ([place]) 05/04/2009   Qualifier: History of  By: Quentin Cornwall CMA, Janett Billow    . 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 Sinoatrial node dysfunction  [facility])    Status post PPM - Dr. Lovena Le  . Sleep apnea    CPAP    Patient Active Problem List   Diagnosis Date Noted  . NICM (nonischemic cardiomyopathy) ([place]) 02/22/2017  . CKD stage G3b/A1, GFR 30-44 and albumin creatinine ratio <30 mg/g   . Dysphagia, idiopathic 02/15/2017  . Pleural effusion on right 01/30/2017  . Osteoporosis 08/10/2016  . Liver cirrhosis secondary to NASH ([place]) 06/13/2016  . Chest pain 10/30/2014  . Chronic renal failure 03/07/2013  . Sinoatrial node dysfunction (HCC)   . Chronic combined systolic and diastolic CHF (congestive heart failure) ([place]) 05/07/2011  . Morbid obesity ([place]) 05/07/2011  . 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 OSA (obstructive sleep apnea) 05/05/2009  . Essential hypertension 05/04/2009  . Permanent atrial fibrillation ([place]) 05/04/2009  . ALLERGIC RHINITIS 05/04/2009    Past Surgical History:  Procedure Laterality Date  . ABDOMINAL HYSTERECTOMY  1991  . COLONOSCOPY  1998   one polyp per patient  . EYE SURGERY    . PACEMAKER INSERTION  Nov 2000   St Jude with revision in 2011  . PERCUTANEOUS NEPHROLITHOTOMY  April 2012    OB History    No data available       Home Medications    Prior to Admission medications   Medication Sig Start Date End Date Taking? Authorizing Provider  allopurinol (ZYLOPRIM) 100 MG tablet Take 100 mg  by mouth 2 (two) times daily.   Yes [provider]  aspirin 325 MG tablet Take 325 mg by mouth daily.     Yes [provider]  calcitRIOL (ROCALTROL) 0.25 MCG capsule Take 0.25 mcg by mouth every other day.    Yes [provider]  calcium carbonate (OS-CAL) 600 MG tablet Take 600 mg by mouth daily.   Yes [provider]  COD LIVER OIL PO Take 1 tablet by mouth daily.     Yes [provider]  docusate sodium (COLACE) 100 MG capsule Take 100 mg by mouth daily as needed for mild constipation.   Yes [provider]  ferrous sulfate 325 (65  FE) MG tablet Take 325 mg by mouth every evening.    Yes [provider]  hydrALAZINE (APRESOLINE) 25 MG tablet Take 0.5 tablets (12.5 mg total) by mouth 3 (three) times daily. 12/14/16  Yes Evans Lance, MD  HYDROcodone-acetaminophen [facility]) 10-325 MG per tablet Take 1 tablet by mouth 2 (two) times daily.    Yes [provider]  isosorbide mononitrate (IMDUR) 30 MG 24 hr tablet Take 1 tablet (30 mg total) by mouth daily. 09/11/16  Yes Lyda Jester M, PA-C  Multiple Vitamin (MULTIVITAMIN) tablet Take 1 tablet by mouth daily.   Yes [provider]  nystatin (MYCOSTATIN/NYSTOP) powder Apply 1 application topically every other day. 02/05/17  Yes [provider]  nystatin-triamcinolone ointment (MYCOLOG) Apply topically 2 (two) times daily. Patient taking differently: Apply 1 application topically 2 (two) times daily as needed (rash).  02/22/17  Yes Barrett, Evelene Croon, PA-C  omeprazole (PRILOSEC) 20 MG capsule Take 20 mg by mouth daily.     Yes [provider]  potassium chloride SA (K-DUR,KLOR-CON) 20 MEQ tablet Take 1 tablet (20 mEq total) by mouth 2 (two) times daily. 11/22/16  Yes Evans Lance, MD  torsemide (DEMADEX) 20 MG tablet Take 2 tablets (40 mg total) by mouth 2 (two) times daily. 03/07/17 06/05/17 Yes Weaver, Reford Olliff T, PA-C  vitamin B-12 (CYANOCOBALAMIN) 500 MCG tablet Take 500 mcg by mouth daily.   Yes [provider]  Glycerin, Adult, 2.1 g SUPP Place 1 suppository rectally daily as needed for moderate constipation. 02/22/17   Barrett, Evelene Croon, PA-C    Family History Family History  Problem Relation Age of Onset  . Heart attack Father   . Stroke Mother   . Diabetes Sister   . Colon cancer Other        seven family members  . Liver disease Neg Hx   . Other Neg Hx     Social History Social History  Substance Use Topics  . Smoking status: Former Smoker    Packs/day: 1.00    Types: Cigarettes    Quit date: 09/26/1979  .  Smokeless tobacco: Never Used  . Alcohol use No     Allergies   Cephalexin; Codeine; Contrast media [iodinated diagnostic agents]; Coreg [carvedilol]; Peppermint flavor; Prednisone; Tape; Ciprofloxacin; Latex; and Penicillins   Review of Systems Review of Systems  Constitutional: Negative for fever.  HENT: Negative for congestion.   Eyes: Negative for redness.  Respiratory: Positive for shortness of breath.   Cardiovascular: Positive for chest pain and leg swelling.  Gastrointestinal: Negative for abdominal pain.  Genitourinary: Negative for dysuria.  Musculoskeletal: Positive for back pain.  Skin: Negative for rash.  Neurological: Negative for headaches.  Hematological: Does not bruise/bleed easily.  Psychiatric/Behavioral: Negative for confusion.     Physical  Exam Updated Vital Signs BP 122/69   Pulse 89   Temp 97.9 F (36.6 C) (Oral)   Resp (!) 23   SpO2 96%   Physical Exam  Constitutional: She is oriented to person, place, and time. She appears well-developed and well-nourished. No distress.  HENT:  Head: Normocephalic and atraumatic.  Mouth/Throat: Oropharynx is clear and moist.  Eyes: Conjunctivae and EOM are normal. Pupils are equal, round, and reactive to light.  Neck: Normal range of motion. Neck supple.  Cardiovascular: Normal rate and regular rhythm.   Pulmonary/Chest: Effort normal and breath sounds normal. No respiratory distress.  Abdominal: Soft. Bowel sounds are normal. There is no tenderness.  Musculoskeletal: Normal range of motion. She exhibits edema.  Chronic skin changes from chronic edema to both legs.  Neurological: She is alert and oriented to person, place, and time. No cranial nerve deficit or sensory deficit. She exhibits normal muscle tone. Coordination normal.  Skin: Skin is warm.  Nursing note and vitals reviewed.    ED Treatments / Results  Labs (all labs ordered are listed, but only abnormal results are displayed) Labs Reviewed    BASIC METABOLIC PANEL - Abnormal; Notable for the following:       Result Value   BUN 48 (*)    Creatinine, Ser 1.91 (*)    GFR calc non Af Amer 25 (*)    GFR calc Af Amer 29 (*)    All other components within normal limits  CBC - Abnormal; Notable for the following:    RBC 3.48 (*)    Hemoglobin 10.8 (*)    HCT 33.6 (*)    Platelets 122 (*)    All other components within normal limits  BRAIN NATRIURETIC PEPTIDE - Abnormal; Notable for the following:    B Natriuretic Peptide 160.0 (*)    All other components within normal limits  I-STAT TROPOININ, ED    EKG  EKG Interpretation  Date/Time:  Monday April 02 2017 14:09:28 EDT Ventricular Rate:  93 PR Interval:    QRS Duration: 184 QT Interval:  436 QTC Calculation: 542 R Axis:   -84 Text Interpretation:  Ventricular-paced rhythm Abnormal ECG When compared with ECG of 11/22/2016, No significant change was found Reconfirmed by Fredia Sorrow [phone]) on 04/02/2017 9:12:39 PM       Radiology Dg Chest 2 View  Result Date: 04/02/2017 CLINICAL DATA:  Dyspnea and chest pain EXAM: CHEST  2 VIEW COMPARISON:  02/17/2017 chest radiograph. FINDINGS: Stable configuration of single lead left subclavian pacemaker. Stable cardiomediastinal silhouette with mild-to-moderate cardiomegaly and aortic atherosclerosis. No pneumothorax. Small right pleural effusion. No left pleural effusion. Mild pulmonary edema. Patchy right lung base opacity. IMPRESSION: 1. Mild congestive heart failure. 2. Small right pleural effusion. 3. Patchy right lung base opacity, favor atelectasis. Electronically Signed   By: Ilona Sorrel M.D.   On: 04/02/2017 15:04    Procedures Procedures (including critical care time)  Medications Ordered in ED Medications  HYDROcodone-acetaminophen (NORCO/VICODIN) 5-325 MG per tablet 1 tablet (not administered)  furosemide (LASIX) injection 80 mg (80 mg Intravenous Given 04/02/17 2143)     Initial Impression / Assessment and Plan /  ED Course  I have reviewed the triage vital signs and the nursing notes.  Pertinent labs & imaging results that were available during my care of the patient were reviewed by me and considered in my medical decision making (see chart for details).      a patient with known history of congestive heart  failure. Followed by Dr. Harrington Challenger. Also followed by Dr. Lovena Le for her pacemaker. Patient over the weekend and had intermittent chest pain and increasing shortness of breath. Review of patient's past medical history shows that she does get volume overloaded.  Patient clinically feels that that's the case here today. She spoke to Dr. Harrington Challenger this morning. And recommended her to be evaluated. She does have a slight increase in her BNP as well as chest x-ray shows some fluid. She was admitted in May with similar findings. Difficulty getting her to diuresis. Eventually did diuresis in the hospital and switched over to oral on discharge. Patient's troponin was negative so pink there's been an acute cardiac event. Here patient received 80 mg of IV Lasix.  Discuss with cardiology on call and they will admit.  Patient here not hypoxic room air sats are 97%. Cardiac monitoring EKG is can assistant with her paced rhythm.  Final Clinical Impressions(s) / ED Diagnoses   Final diagnoses:  Acute on chronic combined systolic and diastolic congestive heart failure [facility])    New Prescriptions New Prescriptions   No medications on file     Fredia Sorrow, MD 04/02/17 2221

## 2017-04-02 NOTE — ED Triage Notes (Signed)
Pt arrives via POv from home with SOB and substernal CP that began Saturday. Per pt has CHF, with lower extremity edema and generalized weakness. Frothy yellow sputum this morning. Wheezes in lung fields. VSS.

## 2017-04-02 NOTE — Telephone Encounter (Signed)
New message       Pt c/o Shortness Of Breath: STAT if SOB developed within the last 24 hours or pt is noticeably SOB on the phone  1. Are you currently SOB (can you hear that pt is SOB on the phone)?  somewhat 2. How long have you been experiencing SOB?  Worse today, started sat pm  3. Are you SOB when sitting or when up moving around?  Moving around mostly  4. Are you currently experiencing any other symptoms?  Arms numb and legs swelling and chest pressure

## 2017-04-02 NOTE — ED Notes (Signed)
Pt reports increase in leg swelling over the past week, 10lb weight gain in 7 days, and increase in SOB that is worse with any movement. Pt has had some changes in her medications/fluid pills d/t stage 3 kidney failure. Pt speaking in clear, full sentences, denies SOB at rest.

## 2017-04-02 NOTE — H&P (Signed)
Cardiology Admission History and Physical:   Patient ID: [name] [name]; [id number]; [birth year]   Admission date: 04/02/2017  Primary Care Provider: [name], MD Primary Cardiologist: Dorris Carnes, MD Primary Electrophysiologist:  Dr. Lovena Le.  Chief Complaint:  Increasing weight, shortness of breath  Patient Profile:    History of Present Illness:   [name] [name] is a 71 y.o. female with a history of permanent AF s/p AVN ablation and pacemaker, cirrhosis, CKD, CHF (EF 25-30%) who presents with weight gain and shortness of breath since approximately 03/20/17.  She was hospitalized 5/25-5/31/18 with similar symptoms.  She was discharged on torsemide 27m BID and metolazone 2.546monce a week.  This was then stopped by Dr. RoHarrington Challengerher primary cardiologist with last dose being 6/26 per patient.  She noted gradual weight gain and 7/5 she took 6046mID of torsemide with increase in urine output for that day.  She was instructed to decrease torsemide back down to 30m60mD given her known CKD.  She has noted steady weight gain with this and is approximately 10 lbs up since last discharge.  She has 3 pillow orthopnea.   Past Medical History:  Diagnosis Date  . Anemia   . Atrial fibrillation (HCC)[place]. Bursitis   . Cancer (HCC)[place]. Cataract   . Cervical cancer (HCC)[place]91   s/p hysterectomy  . Chronic cellulitis   . Chronic combined systolic and diastolic congestive heart failure, NYHA class 2 (HCC)    LVEF 25-30% with restrictive diastolic filling  . Degenerative joint disease   . Diverticulitis   . Essential hypertension   . Gallstones   . GERD (gastroesophageal reflux disease)   . Gout   . Kidney stones   . Morbid obesity (HCC)[place]. NICM (nonischemic cardiomyopathy) (HCC)[place]31/2018  . Osteoarthritis   . Permanent atrial fibrillation (HCC)[place]10/2010   Qualifier: History of  By: RobiQuentin Cornwall, JessJanett Billow. 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 TaylLovena LeSleep apnea    CPAP    Past Surgical History:  Procedure Laterality Date  . ABDOMINAL HYSTERECTOMY  1991  . COLONOSCOPY  1998   one polyp per patient  . EYE SURGERY    . PACEMAKER INSERTION  Nov 2000   St Jude with revision in 2011  . PERCUTANEOUS NEPHROLITHOTOMY  April 2012     Medications Prior to Admission: Prior to Admission medications   Medication Sig Start Date End Date Taking? Authorizing Provider  allopurinol (ZYLOPRIM) 100 MG tablet Take 100 mg by mouth 2 (two) times daily.   Yes [provider]  aspirin 325 MG tablet Take 325 mg by mouth daily.     Yes [provider]  calcitRIOL (ROCALTROL) 0.25 MCG capsule Take 0.25 mcg by mouth every other day.    Yes [provider]  calcium carbonate (OS-CAL) 600 MG tablet Take 600 mg by mouth daily.   Yes [provider]  COD LIVER OIL PO Take 1 tablet by mouth daily.     Yes [provider]  docusate sodium (COLACE) 100 MG capsule Take 100 mg by mouth daily as needed for mild constipation.   Yes [provider]  ferrous sulfate 325 (65 FE) MG tablet Take 325 mg by mouth every evening.  Yes [provider]  hydrALAZINE (APRESOLINE) 25 MG tablet Take 0.5 tablets (12.5 mg total) by mouth 3 (three) times daily. 12/14/16  Yes Evans Lance, MD  HYDROcodone-acetaminophen [facility]) 10-325 MG per tablet Take 1 tablet by mouth 2 (two) times daily.    Yes [provider]  isosorbide mononitrate (IMDUR) 30 MG 24 hr tablet Take 1 tablet (30 mg total) by mouth daily. 09/11/16  Yes Lyda Jester M, PA-C  Multiple Vitamin (MULTIVITAMIN) tablet Take 1 tablet by mouth daily.   Yes [provider]  nystatin (MYCOSTATIN/NYSTOP) powder Apply 1 application topically every other day. 02/05/17  Yes [provider]  nystatin-triamcinolone ointment  (MYCOLOG) Apply topically 2 (two) times daily. Patient taking differently: Apply 1 application topically 2 (two) times daily as needed (rash).  02/22/17  Yes Barrett, Evelene Croon, PA-C  omeprazole (PRILOSEC) 20 MG capsule Take 20 mg by mouth daily.     Yes [provider]  potassium chloride SA (K-DUR,KLOR-CON) 20 MEQ tablet Take 1 tablet (20 mEq total) by mouth 2 (two) times daily. 11/22/16  Yes Evans Lance, MD  torsemide (DEMADEX) 20 MG tablet Take 2 tablets (40 mg total) by mouth 2 (two) times daily. 03/07/17 06/05/17 Yes Weaver, Scott T, PA-C  vitamin B-12 (CYANOCOBALAMIN) 500 MCG tablet Take 500 mcg by mouth daily.   Yes [provider]  Glycerin, Adult, 2.1 g SUPP Place 1 suppository rectally daily as needed for moderate constipation. 02/22/17   Barrett, Evelene Croon, PA-C     Allergies:    Allergies  Allergen Reactions  . Cephalexin Shortness Of Breath    sob  . Codeine Anaphylaxis    REACTION: throat swelling  . Contrast Media [Iodinated Diagnostic Agents] Other (See Comments)    Patient states "I have chronic kidney disease so the doctor said no dye in my veins."  . Coreg [Carvedilol] Other (See Comments)    Beta Blockers cause her organs to shut down per patient  . Peppermint Flavor Shortness Of Breath  . Prednisone Anaphylaxis, Shortness Of Breath and Swelling    REACTION: swelling, S.O.B.  . Tape Other (See Comments)    States plastic tape blisters her skin  . Ciprofloxacin Hives  . Latex Itching and Rash  . Penicillins Hives    Social History:   Social History   Social History  . Marital status: Widowed    Spouse name: N/A  . Number of children: N/A  . Years of education: N/A   Occupational History  . Not on file.   Social History Main Topics  . Smoking status: Former Smoker    Packs/day: 1.00    Types: Cigarettes    Quit date: 09/26/1979  . Smokeless tobacco: Never Used  . Alcohol use No  . Drug use: No  . Sexual activity: No   Other Topics  Concern  . Not on file   Social History Narrative  . No narrative on file    Family History:  The patient's family history includes Colon cancer in her other; Diabetes in her sister; Heart attack in her father; Stroke in her mother. There is no history of Liver disease or Other.    ROS:  Please see the history of present illness.  All other ROS reviewed and negative.     Physical Exam/Data:   Vitals:   04/02/17 2130 04/02/17 2145 04/02/17 2200 04/02/17 2230  BP: 129/75 121/70 122/69 132/82  Pulse: 89 90 89 (!) 104  Resp: 16 17 (!)  23 19  Temp:      TempSrc:      SpO2: 98% 95% 96% 99%    Intake/Output Summary (Last 24 hours) at 04/02/17 2348 Last data filed at 04/02/17 2250  Gross per 24 hour  Intake                0 ml  Output              325 ml  Net             -325 ml   There were no vitals filed for this visit. There is no height or weight on file to calculate BMI.  General:  Well nourished, well developed, in no acute distress HEENT: normal Lymph: no adenopathy Neck: JVD present Endocrine:  No thryomegaly Vascular: No carotid bruits; FA pulses 2+ bilaterally without bruits  Cardiac:  normal S1, S2; RRR; no murmur  Lungs:  Decreased breath sounds at basis Abd: soft, nontender, no hepatomegaly  Ext: 1+ pitting edema BLE Musculoskeletal:  No deformities, BUE and BLE strength normal and equal Skin: warm and dry  Neuro:  CNs 2-12 intact, no focal abnormalities noted Psych:  Normal affect    EKG:  The ECG that was done was personally reviewed and demonstrates V-paced rhythm  Relevant CV Studies: TTE 11/22/16:  EF = 35%, mild AS, mild MR, CVP ~ 15 mm Hg, PASP 40 mm Hg, small pericardial effusion.  Laboratory Data:  Chemistry  Recent Labs Lab 03/30/17 1008 04/02/17 1418  NA 141 139  K 4.3 4.0  CL 98 101  CO2 28 29  GLUCOSE 109* 93  BUN 61* 48*  CREATININE 1.97* 1.91*  CALCIUM 9.4 9.5  GFRNONAA 25* 25*  GFRAA 29* 29*  ANIONGAP  --  9    No results  for input(s): PROT, ALBUMIN, AST, ALT, ALKPHOS, BILITOT in the last 168 hours. Hematology  Recent Labs Lab 04/02/17 1418  WBC 5.7  RBC 3.48*  HGB 10.8*  HCT 33.6*  MCV 96.6  MCH 31.0  MCHC 32.1  RDW 15.1  PLT 122*   Cardiac EnzymesNo results for input(s): TROPONINI in the last 168 hours.   Recent Labs Lab 04/02/17 1435  TROPIPOC 0.02    BNP  Recent Labs Lab 04/02/17 1418  BNP 160.0*     Radiology/Studies:  Dg Chest 2 View  Result Date: 04/02/2017 CLINICAL DATA:  Dyspnea and chest pain EXAM: CHEST  2 VIEW COMPARISON:  02/17/2017 chest radiograph. FINDINGS: Stable configuration of single lead left subclavian pacemaker. Stable cardiomediastinal silhouette with mild-to-moderate cardiomegaly and aortic atherosclerosis. No pneumothorax. Small right pleural effusion. No left pleural effusion. Mild pulmonary edema. Patchy right lung base opacity. IMPRESSION: 1. Mild congestive heart failure. 2. Small right pleural effusion. 3. Patchy right lung base opacity, favor atelectasis. Electronically Signed   By: Ilona Sorrel M.D.   On: 04/02/2017 15:04    Assessment and Plan:   1. Acute on chronic heart failure: Known NICM. So far good response to lasix 60m IV in ER.  Will continue lasix 847mIV BID.  2g Na diet, 2L fluid restriction, strict I/Os.  Per patient and her daughter previous decompensation on beta blocker, will not start.  Given CKD, will not start ACEi.  Continue hydralazine 12.mg TID, imdur 3033maily.  Will replete K as needed. 2. CKD: Cr 1.91, was 1.97 on 7/6 and 2.62 on 6/22.  Will monitor with diuresis. 3. Permanent A-fib s/p AVN ablation:  Continue aspirin 325m54m  daily.  4. OSA: Continue BiPAP while sleeping.  Severity of Illness: The appropriate patient status for this patient is INPATIENT. Inpatient status is judged to be reasonable and necessary in order to provide the required intensity of service to ensure the patient's safety. The patient's presenting symptoms,  physical exam findings, and initial radiographic and laboratory data in the context of their chronic comorbidities is felt to place them at high risk for further clinical deterioration. Furthermore, it is not anticipated that the patient will be medically stable for discharge from the hospital within 2 midnights of admission. The following factors support the patient status of inpatient.   " The patient's presenting symptoms include dyspnea, orthopnea, lower extremity edema. " The worrisome physical exam findings include decreased breath sounds at bases, JVD, BLE edema. " The initial radiographic and laboratory data are worrisome because of pulmonary edema, CKD. " The chronic co-morbidities include CKD, permanent a-fib, morbid obesity.   * I certify that at the point of admission it is my clinical judgment that the patient will require inpatient hospital care spanning beyond 2 midnights from the point of admission due to high intensity of service, high risk for further deterioration and high frequency of surveillance required.*    Signed, Rosanna Randy, MD  04/02/2017 11:48 PM

## 2017-04-03 ENCOUNTER — Encounter (HOSPITAL_COMMUNITY): Payer: Self-pay

## 2017-04-03 DIAGNOSIS — I5043 Acute on chronic combined systolic (congestive) and diastolic (congestive) heart failure: Secondary | ICD-10-CM

## 2017-04-03 LAB — BASIC METABOLIC PANEL
Anion gap: 8 (ref 5–15)
BUN: 45 mg/dL — ABNORMAL HIGH (ref 6–20)
CALCIUM: 9.5 mg/dL (ref 8.9–10.3)
CO2: 32 mmol/L (ref 22–32)
Chloride: 100 mmol/L — ABNORMAL LOW (ref 101–111)
Creatinine, Ser: 1.85 mg/dL — ABNORMAL HIGH (ref 0.44–1.00)
GFR calc non Af Amer: 26 mL/min — ABNORMAL LOW (ref >60)
GFR, EST AFRICAN AMERICAN: 30 mL/min — AB (ref >60)
GLUCOSE: 97 mg/dL (ref 65–99)
Potassium: 3.8 mmol/L (ref 3.5–5.1)
Sodium: 140 mmol/L (ref 135–145)

## 2017-04-03 MED ORDER — ALLOPURINOL 100 MG PO TABS
100.0000 mg | ORAL_TABLET | Freq: Two times a day (BID) | ORAL | Status: DC
  Administered 2017-04-03 – 2017-04-07 (×9): 100 mg via ORAL
  Filled 2017-04-03 (×9): qty 1

## 2017-04-03 MED ORDER — ACETAMINOPHEN 325 MG PO TABS
650.0000 mg | ORAL_TABLET | ORAL | Status: DC | PRN
  Administered 2017-04-04: 650 mg via ORAL
  Filled 2017-04-03: qty 2

## 2017-04-03 MED ORDER — VITAMIN B-12 1000 MCG PO TABS
500.0000 ug | ORAL_TABLET | Freq: Every day | ORAL | Status: DC
  Administered 2017-04-03 – 2017-04-07 (×5): 500 ug via ORAL
  Filled 2017-04-03 (×6): qty 1

## 2017-04-03 MED ORDER — SODIUM CHLORIDE 0.9% FLUSH
3.0000 mL | Freq: Two times a day (BID) | INTRAVENOUS | Status: DC
  Administered 2017-04-03 – 2017-04-07 (×10): 3 mL via INTRAVENOUS

## 2017-04-03 MED ORDER — ASPIRIN 325 MG PO TABS
325.0000 mg | ORAL_TABLET | Freq: Every day | ORAL | Status: DC
  Administered 2017-04-03 – 2017-04-07 (×5): 325 mg via ORAL
  Filled 2017-04-03 (×6): qty 1

## 2017-04-03 MED ORDER — HYDROCODONE-ACETAMINOPHEN 10-325 MG PO TABS
1.0000 | ORAL_TABLET | Freq: Two times a day (BID) | ORAL | Status: DC
  Administered 2017-04-03 – 2017-04-07 (×10): 1 via ORAL
  Filled 2017-04-03 (×10): qty 1

## 2017-04-03 MED ORDER — HEPARIN SODIUM (PORCINE) 5000 UNIT/ML IJ SOLN
5000.0000 [IU] | Freq: Three times a day (TID) | INTRAMUSCULAR | Status: DC
  Administered 2017-04-03 – 2017-04-07 (×12): 5000 [IU] via SUBCUTANEOUS
  Filled 2017-04-03 (×13): qty 1

## 2017-04-03 MED ORDER — CALCITRIOL 0.25 MCG PO CAPS
0.2500 ug | ORAL_CAPSULE | ORAL | Status: DC
Start: 2017-04-03 — End: 2017-04-07
  Administered 2017-04-03 – 2017-04-07 (×2): 0.25 ug via ORAL
  Filled 2017-04-03 (×4): qty 1

## 2017-04-03 MED ORDER — PANTOPRAZOLE SODIUM 40 MG PO TBEC
40.0000 mg | DELAYED_RELEASE_TABLET | Freq: Every day | ORAL | Status: DC
  Administered 2017-04-03 – 2017-04-07 (×5): 40 mg via ORAL
  Filled 2017-04-03 (×5): qty 1

## 2017-04-03 MED ORDER — ONDANSETRON HCL 4 MG/2ML IJ SOLN: 4.0000 mg | Freq: Four times a day (QID) | INTRAMUSCULAR | Status: DC | PRN

## 2017-04-03 MED ORDER — CALCIUM CARBONATE 1250 (500 CA) MG PO TABS
1250.0000 mg | ORAL_TABLET | Freq: Every day | ORAL | Status: DC
  Administered 2017-04-03 – 2017-04-07 (×5): 1250 mg via ORAL
  Filled 2017-04-03 (×5): qty 1

## 2017-04-03 MED ORDER — SODIUM CHLORIDE 0.9 % IV SOLN: 250.0000 mL | INTRAVENOUS | Status: DC | PRN

## 2017-04-03 MED ORDER — HYDRALAZINE HCL 25 MG PO TABS
12.5000 mg | ORAL_TABLET | Freq: Three times a day (TID) | ORAL | Status: DC
  Administered 2017-04-03 – 2017-04-07 (×13): 12.5 mg via ORAL
  Filled 2017-04-03 (×13): qty 1

## 2017-04-03 MED ORDER — SODIUM CHLORIDE 0.9% FLUSH: 3.0000 mL | INTRAVENOUS | Status: DC | PRN

## 2017-04-03 MED ORDER — FERROUS SULFATE 325 (65 FE) MG PO TABS
325.0000 mg | ORAL_TABLET | Freq: Every evening | ORAL | Status: DC
  Administered 2017-04-03 – 2017-04-06 (×4): 325 mg via ORAL
  Filled 2017-04-03 (×4): qty 1

## 2017-04-03 MED ORDER — NYSTATIN 100000 UNIT/GM EX POWD
CUTANEOUS | Status: DC
  Administered 2017-04-03: 18:00:00 via TOPICAL
  Administered 2017-04-05: 1 g via TOPICAL
  Administered 2017-04-07: 10:00:00 via TOPICAL
  Filled 2017-04-03 (×2): qty 15

## 2017-04-03 MED ORDER — FUROSEMIDE 10 MG/ML IJ SOLN
80.0000 mg | Freq: Two times a day (BID) | INTRAMUSCULAR | Status: DC
  Administered 2017-04-03 – 2017-04-05 (×5): 80 mg via INTRAVENOUS
  Filled 2017-04-03 (×5): qty 8

## 2017-04-03 MED ORDER — ISOSORBIDE MONONITRATE ER 30 MG PO TB24
30.0000 mg | ORAL_TABLET | Freq: Every day | ORAL | Status: DC
  Administered 2017-04-03 – 2017-04-07 (×5): 30 mg via ORAL
  Filled 2017-04-03 (×5): qty 1

## 2017-04-03 MED ORDER — DOCUSATE SODIUM 100 MG PO CAPS
100.0000 mg | ORAL_CAPSULE | Freq: Every day | ORAL | Status: DC | PRN
  Administered 2017-04-03 – 2017-04-06 (×3): 100 mg via ORAL
  Filled 2017-04-03 (×4): qty 1

## 2017-04-03 NOTE — Progress Notes (Signed)
Patient placed herself on home bipap machine with settings of IPAP=19cm and EPAP=13cm

## 2017-04-03 NOTE — Progress Notes (Signed)
Patient transferred to [place] from ED. Patient alert an oriented. Patient oriented to room. Plan of care explained to patient. Patient has no concerns at this time. VSS. Will continue to monitor.

## 2017-04-03 NOTE — Progress Notes (Signed)
Progress Note  Patient Name: [name] [name] Date of Encounter: 04/03/2017  Primary Cardiologist: Dr. Harrington Challenger Nephrologist: Dr. Jimmy Footman  Subjective   SOB and LEE persist. No CP. Patient frustrated at frequency of med adjustments and recurrent admissions.  Inpatient Medications    Scheduled Meds: . allopurinol  100 mg Oral BID  . aspirin  325 mg Oral Daily  . calcitRIOL  0.25 mcg Oral QODAY  . calcium carbonate  1,250 mg Oral Daily  . ferrous sulfate  325 mg Oral QPM  . furosemide  80 mg Intravenous BID  . heparin  5,000 Units Subcutaneous Q8H  . hydrALAZINE  12.5 mg Oral TID  . HYDROcodone-acetaminophen  1 tablet Oral BID  . isosorbide mononitrate  30 mg Oral Daily  . nystatin   Topical QODAY  . pantoprazole  40 mg Oral Daily  . sodium chloride flush  3 mL Intravenous Q12H  . vitamin B-12  500 mcg Oral Daily   Continuous Infusions: . sodium chloride     PRN Meds: sodium chloride, acetaminophen, docusate sodium, ondansetron (ZOFRAN) IV, sodium chloride flush   Vital Signs    Vitals:   04/03/17 0000 04/03/17 0052 04/03/17 0358 04/03/17 1133  BP: 108/77 140/72 (!) 150/87 123/63  Pulse: 96 91 96 (!) 106  Resp: 18 18 17 18   Temp:  98 F (36.7 C) 97.8 F (36.6 C) 98.2 F (36.8 C)  TempSrc:  Oral Oral Oral  SpO2: 97% 96% 96% 94%  Weight:  263 lb 1.6 oz (119.3 kg) 261 lb 14.4 oz (118.8 kg)   Height:  5' 6"  (1.676 m)      Intake/Output Summary (Last 24 hours) at 04/03/17 1151 Last data filed at 04/03/17 1134  Gross per 24 hour  Intake              483 ml  Output             1725 ml  Net            -1242 ml   Filed Weights   04/03/17 0052 04/03/17 0358  Weight: 263 lb 1.6 oz (119.3 kg) 261 lb 14.4 oz (118.8 kg)    Telemetry    V paced rhythm   Physical Exam   GEN: No acute distress, morbidly obese HEENT: Normocephalic, atraumatic, sclera non-icteric. Neck: No JVD or bruits. Cardiac: RRR mildly elevated rate no murmurs, rubs, or gallops.    Radials/DP/PT 1+ and equal bilaterally.  Respiratory: Clear to auscultation bilaterally. Breathing is unlabored. GI: Soft, nontender, non-distended, BS +x 4. MS: no deformity. Extremities: No clubbing or cyanosis. 2+ BLE edema with significant skin thickening indicative of chronic venous stasis superimposed on large baseline leg habitus. Distal pedal pulses are 2+ and equal bilaterally. Neuro:  AAOx3. Follows commands. Psych:  Responds to questions appropriately with a normal affect.  Labs    Chemistry Recent Labs Lab 03/30/17 1008 04/02/17 1418 04/03/17 0336  NA 141 139 140  K 4.3 4.0 3.8  CL 98 101 100*  CO2 28 29 32  GLUCOSE 109* 93 97  BUN 61* 48* 45*  CREATININE 1.97* 1.91* 1.85*  CALCIUM 9.4 9.5 9.5  GFRNONAA 25* 25* 26*  GFRAA 29* 29* 30*  ANIONGAP  --  9 8     Hematology Recent Labs Lab 04/02/17 1418  WBC 5.7  RBC 3.48*  HGB 10.8*  HCT 33.6*  MCV 96.6  MCH 31.0  MCHC 32.1  RDW 15.1  PLT 122*  Recent Labs Lab 04/02/17 1435  TROPIPOC 0.02     BNP Recent Labs Lab 04/02/17 1418  BNP 160.0*      Radiology    Dg Chest 2 View  Result Date: 04/02/2017 CLINICAL DATA:  Dyspnea and chest pain EXAM: CHEST  2 VIEW COMPARISON:  02/17/2017 chest radiograph. FINDINGS: Stable configuration of single lead left subclavian pacemaker. Stable cardiomediastinal silhouette with mild-to-moderate cardiomegaly and aortic atherosclerosis. No pneumothorax. Small right pleural effusion. No left pleural effusion. Mild pulmonary edema. Patchy right lung base opacity. IMPRESSION: 1. Mild congestive heart failure. 2. Small right pleural effusion. 3. Patchy right lung base opacity, favor atelectasis. Electronically Signed   By: Ilona Sorrel M.D.   On: 04/02/2017 15:04    Cardiac Studies   2D echo 11/22/16 - Procedure narrative: Transthoracic echocardiography. Image   quality was adequate. The study was technically difficult, as a   result of body habitus. - Left  ventricle: The cavity size was normal. Wall thickness was   increased in a pattern of mild LVH. The estimated ejection   fraction was 35%. There is hypokinesis of the   mid-apicalanteroseptal and apical myocardium. The study is not   technically sufficient to allow evaluation of LV diastolic   function. - Aortic valve: Trileaflet; moderately calcified leaflets. There   was mild stenosis. - Mitral valve: Calcified annulus. Mildly thickened leaflets .   There was mild regurgitation. - Left atrium: The atrium was severely dilated. - Right ventricle: Pacer wire or catheter noted in right ventricle. - Right atrium: The atrium was mildly dilated. Central venous   pressure (est): 15 mm Hg. - Atrial septum: No defect or patent foramen ovale was identified. - Tricuspid valve: There was mild regurgitation. - Pulmonary arteries: PA peak pressure: 40 mm Hg (S). - Pericardium, extracardiac: A small pericardial effusion was   identified posterior to the heart. There was a right pleural   effusion. Impressions: - Mild LVH with LVEF approximately 35%. Hypokinesis of the mid to   distal anteroseptal wall and periapical region noted.   Indeterminate diastolic function. Severe left atrial enlargement.   Mildly calcified mitral annulus with thickened leaflets and mild   mitral regurgitation. Mild calcific aortic stenosis. Device wire   noted within the right heart.. Mild tricuspid regurgitation with   PASP estimated 40 mmHg. Small posterior pericardial effusion and   right pleural effusion noted.  Patient Profile     71 y.o. 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 Pt has declined anticoag for atrial fib and declines beta blocker therapy, stating she had a severe reaction in the past unrelated to her HR. Recent OP adjustment of diuretics due to AKI with Cr up to 2.62 prompting d/c of metolazone, torsemide held x1 day then resumed at 80m BID.  Assessment & Plan    1. Acute on chronic combined CHF - prior dry weight in office was around 257. Pt reports no sig improvement in symptoms but is down 2lb and does report increased urination compared to recent OP. Discussed that progression of disease is made challenging by comorbidities. She reports strict compliance with sodium and fluid restriction, seems to have a good handle on  HF recommendations. Unfortunately management of her HF has been limited by her CKD. She is not on ACEI, ARB or spiro due to this. She also refuses beta blocker therapy as she states this shut her whole system down completely with body aches, "all systems shut down" and refuses to reconsider. She is on Imdur/hydralazine, may be some room to titrate these. Would continue current diuretic dose. I will review further plans with MD - also would poise the question, is there any role in upgrading her single chamber PPM to a BiV given her LVEF?  2. CKD stage III-IV - follow Cr with diuresis.  3. Cirrhosis - may also be contributing to edema, anemia, thrombocytopenia.  4. Permanent atrial fib s/p AVN ablation - refuses anticoagulation. See above. Pt refuses beta blocker. Diltiazem not a good choice given her LV dysfunction and digoxin poor choice given her CKD.  Signed, Charlie Pitter, PA-C  04/03/2017, 11:51 AM    Personally seen and examined. Agree with above.  71 year old with frequent admission for systolic HF Sitting in chair, 3+ edema, lungs clear, RRR with 2/6 SM   - Continue with IV lasix  - Monitor renal function  Candee Furbish, MD

## 2017-04-03 NOTE — Progress Notes (Signed)
Patient refuses to use SCD's. Nurse educated patient on use of SCD. Patient verbalizes understanding and wishes to not use SCD's at this time. Will continue to monitor patient.

## 2017-04-04 DIAGNOSIS — Z950 Presence of cardiac pacemaker: Secondary | ICD-10-CM

## 2017-04-04 DIAGNOSIS — K7581 Nonalcoholic steatohepatitis (NASH): Secondary | ICD-10-CM

## 2017-04-04 DIAGNOSIS — K7460 Unspecified cirrhosis of liver: Secondary | ICD-10-CM

## 2017-04-04 LAB — CBC WITH DIFFERENTIAL/PLATELET
Basophils Absolute: 0 10*3/uL (ref 0.0–0.1)
Basophils Relative: 0 %
EOS ABS: 0.1 10*3/uL (ref 0.0–0.7)
EOS PCT: 3 %
HCT: 32.5 % — ABNORMAL LOW (ref 36.0–46.0)
Hemoglobin: 10.4 g/dL — ABNORMAL LOW (ref 12.0–15.0)
LYMPHS ABS: 1 10*3/uL (ref 0.7–4.0)
LYMPHS PCT: 21 %
MCH: 30.6 pg (ref 26.0–34.0)
MCHC: 32 g/dL (ref 30.0–36.0)
MCV: 95.6 fL (ref 78.0–100.0)
MONOS PCT: 9 %
Monocytes Absolute: 0.5 10*3/uL (ref 0.1–1.0)
Neutro Abs: 3.2 10*3/uL (ref 1.7–7.7)
Neutrophils Relative %: 67 %
PLATELETS: 120 10*3/uL — AB (ref 150–400)
RBC: 3.4 MIL/uL — AB (ref 3.87–5.11)
RDW: 15 % (ref 11.5–15.5)
WBC: 4.9 10*3/uL (ref 4.0–10.5)

## 2017-04-04 LAB — BASIC METABOLIC PANEL
Anion gap: 10 (ref 5–15)
BUN: 40 mg/dL — AB (ref 6–20)
CHLORIDE: 101 mmol/L (ref 101–111)
CO2: 29 mmol/L (ref 22–32)
CREATININE: 1.8 mg/dL — AB (ref 0.44–1.00)
Calcium: 9.3 mg/dL (ref 8.9–10.3)
GFR calc Af Amer: 31 mL/min — ABNORMAL LOW (ref >60)
GFR calc non Af Amer: 27 mL/min — ABNORMAL LOW (ref >60)
GLUCOSE: 94 mg/dL (ref 65–99)
POTASSIUM: 3.9 mmol/L (ref 3.5–5.1)
Sodium: 140 mmol/L (ref 135–145)

## 2017-04-04 MED ORDER — MAGNESIUM HYDROXIDE 400 MG/5ML PO SUSP: 30.0000 mL | Freq: Every day | ORAL | Status: DC

## 2017-04-04 NOTE — Progress Notes (Signed)
Progress Note  Patient Name: [name] [name] Date of Encounter: 04/04/2017  Primary Cardiologist: Dr. Harrington Challenger Nephrologist: Dr. Jimmy Footman  Subjective   Still with dyspnea but improved. -2L out. No CP. Reminds me that 21 liters were taken off previoiusly  Inpatient Medications    Scheduled Meds: . allopurinol  100 mg Oral BID  . aspirin  325 mg Oral Daily  . calcitRIOL  0.25 mcg Oral QODAY  . calcium carbonate  1,250 mg Oral Daily  . ferrous sulfate  325 mg Oral QPM  . furosemide  80 mg Intravenous BID  . heparin  5,000 Units Subcutaneous Q8H  . hydrALAZINE  12.5 mg Oral TID  . HYDROcodone-acetaminophen  1 tablet Oral BID  . isosorbide mononitrate  30 mg Oral Daily  . nystatin   Topical QODAY  . pantoprazole  40 mg Oral Daily  . sodium chloride flush  3 mL Intravenous Q12H  . vitamin B-12  500 mcg Oral Daily   Continuous Infusions: . sodium chloride     PRN Meds: sodium chloride, acetaminophen, docusate sodium, ondansetron (ZOFRAN) IV, sodium chloride flush   Vital Signs    Vitals:   04/03/17 1827 04/03/17 1959 04/03/17 2315 04/04/17 0608  BP: 119/69 134/70  (!) 141/78  Pulse: 95 (!) 101 99 98  Resp:  20 18 20   Temp:  97.8 F (36.6 C)  98.1 F (36.7 C)  TempSrc:  Oral  Oral  SpO2:  96% 96% 98%  Weight:    259 lb 11.2 oz (117.8 kg)  Height:        Intake/Output Summary (Last 24 hours) at 04/04/17 1043 Last data filed at 04/04/17 0938  Gross per 24 hour  Intake              923 ml  Output             1875 ml  Net             -952 ml   Filed Weights   04/03/17 0052 04/03/17 0358 04/04/17 0608  Weight: 263 lb 1.6 oz (119.3 kg) 261 lb 14.4 oz (118.8 kg) 259 lb 11.2 oz (117.8 kg)    Telemetry    V paced.    Physical Exam   GEN: No acute distress, morbidly obese HEENT: Normocephalic, atraumatic, sclera non-icteric. Neck: No JVD or bruits. Cardiac: RRR mildly elevated rate no murmurs, rubs, or gallops.  Radials/DP/PT 1+ and equal bilaterally.    Respiratory: Clear to auscultation bilaterally. Breathing is unlabored. GI: Soft, nontender, non-distended, BS +x 4. MS: no deformity. Extremities: No clubbing or cyanosis. 2+ BLE edema with significant skin thickening indicative of chronic venous stasis superimposed on large baseline leg habitus. Distal pedal pulses are 2+ and equal bilaterally. Neuro:  AAOx3. Follows commands. Psych:  Responds to questions appropriately with a normal affect. No significant change in exam from yesterday. Perhaps slightly less edema  Labs    Chemistry  Recent Labs Lab 04/02/17 1418 04/03/17 0336 04/04/17 0336  NA 139 140 140  K 4.0 3.8 3.9  CL 101 100* 101  CO2 29 32 29  GLUCOSE 93 97 94  BUN 48* 45* 40*  CREATININE 1.91* 1.85* 1.80*  CALCIUM 9.5 9.5 9.3  GFRNONAA 25* 26* 27*  GFRAA 29* 30* 31*  ANIONGAP 9 8 10      Hematology  Recent Labs Lab 04/02/17 1418 04/04/17 0336  WBC 5.7 4.9  RBC 3.48* 3.40*  HGB 10.8* 10.4*  HCT 33.6* 32.5*  MCV 96.6 95.6  MCH 31.0 30.6  MCHC 32.1 32.0  RDW 15.1 15.0  PLT 122* 120*     Recent Labs Lab 04/02/17 1435  TROPIPOC 0.02     BNP  Recent Labs Lab 04/02/17 1418  BNP 160.0*      Radiology    Dg Chest 2 View  Result Date: 04/02/2017 CLINICAL DATA:  Dyspnea and chest pain EXAM: CHEST  2 VIEW COMPARISON:  02/17/2017 chest radiograph. FINDINGS: Stable configuration of single lead left subclavian pacemaker. Stable cardiomediastinal silhouette with mild-to-moderate cardiomegaly and aortic atherosclerosis. No pneumothorax. Small right pleural effusion. No left pleural effusion. Mild pulmonary edema. Patchy right lung base opacity. IMPRESSION: 1. Mild congestive heart failure. 2. Small right pleural effusion. 3. Patchy right lung base opacity, favor atelectasis. Electronically Signed   By: Ilona Sorrel M.D.   On: 04/02/2017 15:04    Cardiac Studies   2D echo 11/22/16 - Procedure narrative: Transthoracic echocardiography. Image   quality  was adequate. The study was technically difficult, as a   result of body habitus. - Left ventricle: The cavity size was normal. Wall thickness was   increased in a pattern of mild LVH. The estimated ejection   fraction was 35%. There is hypokinesis of the   mid-apicalanteroseptal and apical myocardium. The study is not   technically sufficient to allow evaluation of LV diastolic   function. - Aortic valve: Trileaflet; moderately calcified leaflets. There   was mild stenosis. - Mitral valve: Calcified annulus. Mildly thickened leaflets .   There was mild regurgitation. - Left atrium: The atrium was severely dilated. - Right ventricle: Pacer wire or catheter noted in right ventricle. - Right atrium: The atrium was mildly dilated. Central venous   pressure (est): 15 mm Hg. - Atrial septum: No defect or patent foramen ovale was identified. - Tricuspid valve: There was mild regurgitation. - Pulmonary arteries: PA peak pressure: 40 mm Hg (S). - Pericardium, extracardiac: A small pericardial effusion was   identified posterior to the heart. There was a right pleural   effusion. Impressions: - Mild LVH with LVEF approximately 35%. Hypokinesis of the mid to   distal anteroseptal wall and periapical region noted.   Indeterminate diastolic function. Severe left atrial enlargement.   Mildly calcified mitral annulus with thickened leaflets and mild   mitral regurgitation. Mild calcific aortic stenosis. Device wire   noted within the right heart.. Mild tricuspid regurgitation with   PASP estimated 40 mmHg. Small posterior pericardial effusion and   right pleural effusion noted.  Patient Profile     71 y.o. 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 Pt has declined anticoag for atrial fib and declines beta blocker therapy, stating she had a severe reaction in the past unrelated to her HR. Recent OP adjustment of diuretics due to AKI with Cr up to 2.62 prompting d/c of metolazone, torsemide held x1 day then resumed at 94m BID.  Assessment & Plan    1. Acute on chronic combined CHF   - prior dry weight in office was around 257.    - Current weight 259  - Understands that progression of disease is made challenging by comorbidities. She  reports strict compliance with sodium and fluid restriction, seems to have a good handle on HF recommendations. Unfortunately management of her HF has been limited by her CKD. She is not on ACEI, ARB or spiro due to this. She also refuses beta blocker therapy as she states this shut her whole system down completely with body aches, "all systems shut down" and refuses to reconsider.   - Continue Imdur/hydralazine.   - Continue IV lasix BID  - Doubtful that upgrading her single chamber PPM to a BiV given her LVEF would be of significant benefit in someone who can not walk as she states.  2. CKD stage III-IV - follow Cr with diuresis. Creat 1.8 stable  3. Cirrhosis - may also be contributing to edema, anemia, thrombocytopenia. Fatty liver dz.   4. Permanent atrial fib s/p AVN ablation - refuses anticoagulation. See above. Pt refuses beta blocker. Diltiazem not a good choice given her LV dysfunction and digoxin poor choice given her CKD. No changes. V paced. Will DC tele.   Signed, Candee Furbish, MD  04/04/2017, 10:43 AM

## 2017-04-04 NOTE — Plan of Care (Signed)
Problem: Safety: Goal: Ability to remain free from injury will improve Outcome: Progressing Importance of calling for assistance stressed to patient.  Patient verbalized understanding.  Correct use of call bell demonstrated throughout the night.  Pt progressing towards goal.

## 2017-04-05 ENCOUNTER — Ambulatory Visit: Payer: Medicare Other | Admitting: Internal Medicine

## 2017-04-05 LAB — BASIC METABOLIC PANEL
Anion gap: 9 (ref 5–15)
BUN: 37 mg/dL — AB (ref 6–20)
CALCIUM: 8.9 mg/dL (ref 8.9–10.3)
CO2: 31 mmol/L (ref 22–32)
CREATININE: 1.86 mg/dL — AB (ref 0.44–1.00)
Chloride: 99 mmol/L — ABNORMAL LOW (ref 101–111)
GFR calc non Af Amer: 26 mL/min — ABNORMAL LOW (ref >60)
GFR, EST AFRICAN AMERICAN: 30 mL/min — AB (ref >60)
Glucose, Bld: 88 mg/dL (ref 65–99)
Potassium: 3.7 mmol/L (ref 3.5–5.1)
SODIUM: 139 mmol/L (ref 135–145)

## 2017-04-05 LAB — CBC WITH DIFFERENTIAL/PLATELET
BASOS PCT: 0 %
Basophils Absolute: 0 10*3/uL (ref 0.0–0.1)
EOS ABS: 0.1 10*3/uL (ref 0.0–0.7)
EOS PCT: 4 %
HCT: 32.6 % — ABNORMAL LOW (ref 36.0–46.0)
HEMOGLOBIN: 10.4 g/dL — AB (ref 12.0–15.0)
Lymphocytes Relative: 27 %
Lymphs Abs: 1.1 10*3/uL (ref 0.7–4.0)
MCH: 30.4 pg (ref 26.0–34.0)
MCHC: 31.9 g/dL (ref 30.0–36.0)
MCV: 95.3 fL (ref 78.0–100.0)
MONOS PCT: 10 %
Monocytes Absolute: 0.4 10*3/uL (ref 0.1–1.0)
NEUTROS PCT: 59 %
Neutro Abs: 2.3 10*3/uL (ref 1.7–7.7)
PLATELETS: 107 10*3/uL — AB (ref 150–400)
RBC: 3.42 MIL/uL — AB (ref 3.87–5.11)
RDW: 14.9 % (ref 11.5–15.5)
WBC: 4 10*3/uL (ref 4.0–10.5)

## 2017-04-05 MED ORDER — FUROSEMIDE 10 MG/ML IJ SOLN
80.0000 mg | Freq: Three times a day (TID) | INTRAMUSCULAR | Status: DC
  Administered 2017-04-05 – 2017-04-06 (×5): 80 mg via INTRAVENOUS
  Filled 2017-04-05 (×6): qty 8

## 2017-04-05 MED ORDER — POLYETHYLENE GLYCOL 3350 17 G PO PACK
17.0000 g | PACK | Freq: Every day | ORAL | Status: DC
  Administered 2017-04-05 – 2017-04-07 (×3): 17 g via ORAL
  Filled 2017-04-05 (×2): qty 1

## 2017-04-05 NOTE — Consult Note (Signed)
   [facility] CM Inpatient Consult   04/05/2017  [name] [name] [birth year] [id number]  Patient was evaluated for multiple hospitalizations in the past 6 months in the Medicare ACO.  Admitted on 04/02/17 with Acute on Chronic Systolic and Dystolic HF exacerbation.  Met with the patient at the bedside.  Patient sitting up in recliner.  Patient states she is feeling fair today.  She states she has been trying to manage her HF and Kidney failure for years but knows her time is in God's hand.  She states she has a good life with her daughter and they live in [place].  She endorses Dr. Maurice Small, with the [facility].  Patient states she comes to [place] and an active patient with this practice at least twice a year. She denies any difficulty getting her medications or making it to appointments.  She lives in her own place at her daughter.  She has a motorized wheelchair.  She states she has the folders from [place] at home and also the brochure and contact information for [facility]  She states, "I still have that brochure and information at home, I actually have two [laughing].  She states, "If I get calls from the hospital I always answer them and have no problem with the calls."  Patient showed her log where she keeps track of her vital signs, weights, appointments, symptoms, and calls to and from her provider.  No needs noted at this time.  Patient's primary care providers general do their own follow up calls for transition of care.  Patient states the [facility] Physician office person has already dropped by.  For questions, please contact:  Natividad Brood, RN BSN [facility] Liaison  [phone] business mobile phone Toll free office [phone]

## 2017-04-05 NOTE — Progress Notes (Signed)
Progress Note  Patient Name: [name] [name] Date of Encounter: 04/05/2017  Primary Cardiologist: Dr. Harrington Challenger Nephrologist: Dr. Jimmy Footman   Subjective   Comfortable at rest but still with dyspnea with transfers from bed to chair to bathroom. She is frustrated regarding recurrent admissions for HF, despite reported compliance at home with diuretic regimen, daily weights and low salt diet.   Inpatient Medications    Scheduled Meds: . allopurinol  100 mg Oral BID  . aspirin  325 mg Oral Daily  . calcitRIOL  0.25 mcg Oral QODAY  . calcium carbonate  1,250 mg Oral Daily  . ferrous sulfate  325 mg Oral QPM  . furosemide  80 mg Intravenous BID  . heparin  5,000 Units Subcutaneous Q8H  . hydrALAZINE  12.5 mg Oral TID  . HYDROcodone-acetaminophen  1 tablet Oral BID  . isosorbide mononitrate  30 mg Oral Daily  . nystatin   Topical QODAY  . pantoprazole  40 mg Oral Daily  . sodium chloride flush  3 mL Intravenous Q12H  . vitamin B-12  500 mcg Oral Daily   Continuous Infusions: . sodium chloride     PRN Meds: sodium chloride, acetaminophen, docusate sodium, ondansetron (ZOFRAN) IV, sodium chloride flush   Vital Signs    Vitals:   04/04/17 0608 04/04/17 1300 04/04/17 2145 04/05/17 0450  BP: (!) 141/78 123/66 114/64 112/67  Pulse: 98 94 78 (!) 102  Resp: 20 18 18 18   Temp: 98.1 F (36.7 C) 97.7 F (36.5 C) 98 F (36.7 C) 98 F (36.7 C)  TempSrc: Oral Oral Oral Oral  SpO2: 98% 97% 97% 95%  Weight: 259 lb 11.2 oz (117.8 kg)   259 lb 1.6 oz (117.5 kg)  Height:        Intake/Output Summary (Last 24 hours) at 04/05/17 0751 Last data filed at 04/05/17 0716  Gross per 24 hour  Intake             1060 ml  Output             1650 ml  Net             -590 ml   Filed Weights   04/03/17 0358 04/04/17 0608 04/05/17 0450  Weight: 261 lb 14.4 oz (118.8 kg) 259 lb 11.2 oz (117.8 kg) 259 lb 1.6 oz (117.5 kg)    Telemetry    Telemetry discontinued 04/04/17- Personally  Reviewed  ECG    V paced - Personally Reviewed  Physical Exam   GEN: No acute distress. Obese    Neck: No JVD Cardiac: irreguarlly irregular, regular rate, no murmurs, rubs, or gallops.  Respiratory: decreased BS at the bases bilaterally. GI: Soft, nontender, non-distended, obese  MS: bilateral LEE/ pedal edema and chronic venous stasis dermatitis  Neuro:  Nonfocal  Psych: Normal affect   Labs    Chemistry Recent Labs Lab 04/03/17 0336 04/04/17 0336 04/05/17 0432  NA 140 140 139  K 3.8 3.9 3.7  CL 100* 101 99*  CO2 32 29 31  GLUCOSE 97 94 88  BUN 45* 40* 37*  CREATININE 1.85* 1.80* 1.86*  CALCIUM 9.5 9.3 8.9  GFRNONAA 26* 27* 26*  GFRAA 30* 31* 30*  ANIONGAP 8 10 9      Hematology Recent Labs Lab 04/02/17 1418 04/04/17 0336 04/05/17 0432  WBC 5.7 4.9 4.0  RBC 3.48* 3.40* 3.42*  HGB 10.8* 10.4* 10.4*  HCT 33.6* 32.5* 32.6*  MCV 96.6 95.6 95.3  MCH 31.0 30.6 30.4  MCHC 32.1 32.0 31.9  RDW 15.1 15.0 14.9  PLT 122* 120* PENDING    Cardiac EnzymesNo results for input(s): TROPONINI in the last 168 hours.  Recent Labs Lab 04/02/17 1435  TROPIPOC 0.02     BNP Recent Labs Lab 04/02/17 1418  BNP 160.0*     DDimer No results for input(s): DDIMER in the last 168 hours.   Radiology    No results found.  Cardiac Studies   2D echo 11/22/16  - Procedure narrative: Transthoracic echocardiography. Image quality was adequate. The study was technically difficult, as a result of body habitus. - Left ventricle: The cavity size was normal. Wall thickness was increased in a pattern of mild LVH. The estimated ejection fraction was 35%. There is hypokinesis of the mid-apicalanteroseptal and apical myocardium. The study is not technically sufficient to allow evaluation of LV diastolic function. - Aortic valve: Trileaflet; moderately calcified leaflets. There was mild stenosis. - Mitral valve: Calcified annulus. Mildly thickened leaflets  . There was mild regurgitation. - Left atrium: The atrium was severely dilated. - Right ventricle: Pacer wire or catheter noted in right ventricle. - Right atrium: The atrium was mildly dilated. Central venous pressure (est): 15 mm Hg. - Atrial septum: No defect or patent foramen ovale was identified. - Tricuspid valve: There was mild regurgitation. - Pulmonary arteries: PA peak pressure: 40 mm Hg (S). - Pericardium, extracardiac: A small pericardial effusion was identified posterior to the heart. There was a right pleural effusion. Impressions: - Mild LVH with LVEF approximately 35%. Hypokinesis of the mid to distal anteroseptal wall and periapical region noted. Indeterminate diastolic function. Severe left atrial enlargement. Mildly calcified mitral annulus with thickened leaflets and mild mitral regurgitation. Mild calcific aortic stenosis. Device wire noted within the right heart.. Mild tricuspid regurgitation with PASP estimated 40 mmHg. Small posterior pericardial effusion and right pleural effusion noted.  Patient Profile        71 y.o. 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 Pt has declined anticoag for atrial fib and declines beta blocker therapy, stating she had a severe reaction in the past unrelated to her HR. Recent OP adjustment of diuretics due to AKI with Cr up to 2.62 prompting d/c of metolazone, torsemide held x1 day then resumed at 49m BID.  Pt Admitted 04/02/17 for acute on chronic combined systolic and diastolic CHF.   Assessment & Plan    1. A/C  Combined Systolic and Diastolic CHF: EF 385% Continues to diurese with IV Lasix w/ an additional 1.5L out in UOP yesterday. I/Os net negative 2.7L. Medical management of her systolic HF has been limited>>no ACE/ARB or spironolactone given CKD and patient refuses to take beta blockers due to severe reactions in the past. We will continue Imdur and hydralazine. She is still volume overloaded and continues to have exertional dyspnea with short transfers from bed to chair. Previous weights reviewed. Her hospital d/c weight in May of this year was 253 lb. She is at 259 today. Renal function and K both stable. Continue IV diuretics. Continue strict I/Os, daily weights and low sodium diet. Daily  BMPs for monitoring of renal function and electrolytes.   2. Stage III CKD:  stable. Baseline SCr ~1.8-2.0. Scr today is 1.86. Continue to monitor closely with diuresis.   3. Permanent Atrial Fibrillation: s/p AVN ablation. Medical management limited. Pt refuses anticoagulation as well as beta blocker therapy given history of intolerance. Not a good candidate for Cardizem given LV dysfunction, nor digoxin given CKD.    4. Cirrhosis: may also be contributing to edema, anemia, thrombocytopenia. Has fatty liver dz.     Signed, Lyda Jester, PA-C  04/05/2017, 7:51 AM    Personally seen and examined. Agree with above.  Frustrated again 3+ Edema, RRR, bibasilar crackles.   - increased her lasix to 80 IV TID. Took off 21 L last admit.   - continue diuresis. Still with dyspnea  - Pacer working well  - Agree with cirrhosis as also contributing to edema.   Candee Furbish, MD

## 2017-04-05 NOTE — Progress Notes (Signed)
Educated pt about Patent attorney.  Pt continues to refuse bed/chair alarm.

## 2017-04-06 DIAGNOSIS — I428 Other cardiomyopathies: Secondary | ICD-10-CM

## 2017-04-06 LAB — BASIC METABOLIC PANEL
ANION GAP: 8 (ref 5–15)
BUN: 36 mg/dL — ABNORMAL HIGH (ref 6–20)
CALCIUM: 8.9 mg/dL (ref 8.9–10.3)
CO2: 32 mmol/L (ref 22–32)
Chloride: 98 mmol/L — ABNORMAL LOW (ref 101–111)
Creatinine, Ser: 1.87 mg/dL — ABNORMAL HIGH (ref 0.44–1.00)
GFR, EST AFRICAN AMERICAN: 30 mL/min — AB (ref >60)
GFR, EST NON AFRICAN AMERICAN: 26 mL/min — AB (ref >60)
GLUCOSE: 103 mg/dL — AB (ref 65–99)
Potassium: 3.6 mmol/L (ref 3.5–5.1)
SODIUM: 138 mmol/L (ref 135–145)

## 2017-04-06 LAB — CBC WITH DIFFERENTIAL/PLATELET
BASOS ABS: 0 10*3/uL (ref 0.0–0.1)
BASOS PCT: 0 %
Eosinophils Absolute: 0.2 10*3/uL (ref 0.0–0.7)
Eosinophils Relative: 4 %
HEMATOCRIT: 31.5 % — AB (ref 36.0–46.0)
Hemoglobin: 9.8 g/dL — ABNORMAL LOW (ref 12.0–15.0)
Lymphocytes Relative: 19 %
Lymphs Abs: 1 10*3/uL (ref 0.7–4.0)
MCH: 29.6 pg (ref 26.0–34.0)
MCHC: 31.1 g/dL (ref 30.0–36.0)
MCV: 95.2 fL (ref 78.0–100.0)
MONO ABS: 0.5 10*3/uL (ref 0.1–1.0)
Monocytes Relative: 10 %
NEUTROS ABS: 3.4 10*3/uL (ref 1.7–7.7)
NEUTROS PCT: 67 %
Platelets: 119 10*3/uL — ABNORMAL LOW (ref 150–400)
RBC: 3.31 MIL/uL — AB (ref 3.87–5.11)
RDW: 14.8 % (ref 11.5–15.5)
WBC: 5 10*3/uL (ref 4.0–10.5)

## 2017-04-06 MED ORDER — METOLAZONE 2.5 MG PO TABS
2.5000 mg | ORAL_TABLET | Freq: Once | ORAL | Status: AC
  Administered 2017-04-06: 2.5 mg via ORAL
  Filled 2017-04-06: qty 1

## 2017-04-06 MED ORDER — POTASSIUM CHLORIDE CRYS ER 20 MEQ PO TBCR
40.0000 meq | EXTENDED_RELEASE_TABLET | Freq: Once | ORAL | Status: AC
Start: 2017-04-06 — End: 2017-04-06
  Administered 2017-04-06: 40 meq via ORAL
  Filled 2017-04-06: qty 2

## 2017-04-06 NOTE — Care Management Important Message (Signed)
Important Message  Patient Details  Name: [name] [name] MRN: [id number] Date of Birth: 05/09/1946   Medicare Important Message Given:  Yes    Orbie Pyo 04/06/2017, 1:14 PM

## 2017-04-06 NOTE — Progress Notes (Signed)
Ms Bocchino up in chair.  Her fall risk score is 13.  She is alert and oriented and declined chair alarm.  Stated she will use bed alarm when in bed.  Patient instructed to call for assistance if she feels dizzy and/or weak when she gets up and to sit back down to call. She stated she will do this.

## 2017-04-06 NOTE — Progress Notes (Signed)
Progress Note  Patient Name: [name] [name] Date of Encounter: 04/06/2017  Primary Cardiologist: Dr. Harrington Challenger   Subjective   No major changes overnight. Comfortable at rest but still with mild exertional dyspnea transferring from bed to chair. She is concerned about diuretics affecting her kidneys.   Inpatient Medications    Scheduled Meds: . allopurinol  100 mg Oral BID  . aspirin  325 mg Oral Daily  . calcitRIOL  0.25 mcg Oral QODAY  . calcium carbonate  1,250 mg Oral Daily  . ferrous sulfate  325 mg Oral QPM  . furosemide  80 mg Intravenous TID  . heparin  5,000 Units Subcutaneous Q8H  . hydrALAZINE  12.5 mg Oral TID  . HYDROcodone-acetaminophen  1 tablet Oral BID  . isosorbide mononitrate  30 mg Oral Daily  . nystatin   Topical QODAY  . pantoprazole  40 mg Oral Daily  . polyethylene glycol  17 g Oral Daily  . sodium chloride flush  3 mL Intravenous Q12H  . vitamin B-12  500 mcg Oral Daily   Continuous Infusions: . sodium chloride     PRN Meds: sodium chloride, acetaminophen, docusate sodium, ondansetron (ZOFRAN) IV, sodium chloride flush   Vital Signs    Vitals:   04/05/17 0450 04/05/17 1158 04/05/17 2237 04/06/17 0400  BP: 112/67 115/82 137/80 139/70  Pulse: (!) 102 95 92 96  Resp: 18 20 20 20   Temp: 98 F (36.7 C) 97.7 F (36.5 C) 97.7 F (36.5 C) 97.8 F (36.6 C)  TempSrc: Oral Oral Oral Oral  SpO2: 95% 99% 96% 95%  Weight: 259 lb 1.6 oz (117.5 kg)   258 lb 6.4 oz (117.2 kg)  Height:        Intake/Output Summary (Last 24 hours) at 04/06/17 0812 Last data filed at 04/06/17 0726  Gross per 24 hour  Intake             1357 ml  Output             2103 ml  Net             -746 ml   Filed Weights   04/04/17 0608 04/05/17 0450 04/06/17 0400  Weight: 259 lb 11.2 oz (117.8 kg) 259 lb 1.6 oz (117.5 kg) 258 lb 6.4 oz (117.2 kg)    Telemetry    Discontinued, permanent afib. - Personally Reviewed  ECG    V paced  - Personally Reviewed  Physical  Exam   GEN: No acute distress.  Obese  Neck: No JVD Cardiac: irreguarlly irregular, regular rate, no murmurs, rubs, or gallops.  Respiratory: Decreased BS on the RLL (pleural effusion) GI: Soft, nontender, non-distended  MS: 1+ bilateral LEE with chronic venous stasis dermatitis; No deformity. Neuro:  Nonfocal  Psych: Normal affect   Labs    Chemistry Recent Labs Lab 04/04/17 0336 04/05/17 0432 04/06/17 0250  NA 140 139 138  K 3.9 3.7 3.6  CL 101 99* 98*  CO2 29 31 32  GLUCOSE 94 88 103*  BUN 40* 37* 36*  CREATININE 1.80* 1.86* 1.87*  CALCIUM 9.3 8.9 8.9  GFRNONAA 27* 26* 26*  GFRAA 31* 30* 30*  ANIONGAP 10 9 8      Hematology Recent Labs Lab 04/04/17 0336 04/05/17 0432 04/06/17 0250  WBC 4.9 4.0 5.0  RBC 3.40* 3.42* 3.31*  HGB 10.4* 10.4* 9.8*  HCT 32.5* 32.6* 31.5*  MCV 95.6 95.3 95.2  MCH 30.6 30.4 29.6  MCHC 32.0 31.9 31.1  RDW 15.0 14.9 14.8  PLT 120* 107* 119*    Cardiac EnzymesNo results for input(s): TROPONINI in the last 168 hours.  Recent Labs Lab 04/02/17 1435  TROPIPOC 0.02     BNP Recent Labs Lab 04/02/17 1418  BNP 160.0*     DDimer No results for input(s): DDIMER in the last 168 hours.   Radiology    No results found.  Cardiac Studies   2D echo 11/22/16  - Procedure narrative: Transthoracic echocardiography. Image quality was adequate. The study was technically difficult, as a result of body habitus. - Left ventricle: The cavity size was normal. Wall thickness was increased in a pattern of mild LVH. The estimated ejection fraction was 35%. There is hypokinesis of the mid-apicalanteroseptal and apical myocardium. The study is not technically sufficient to allow evaluation of LV diastolic function. - Aortic valve: Trileaflet; moderately calcified leaflets. There was mild stenosis. - Mitral valve: Calcified annulus. Mildly thickened leaflets . There was mild regurgitation. - Left atrium: The atrium was  severely dilated. - Right ventricle: Pacer wire or catheter noted in right ventricle. - Right atrium: The atrium was mildly dilated. Central venous pressure (est): 15 mm Hg. - Atrial septum: No defect or patent foramen ovale was identified. - Tricuspid valve: There was mild regurgitation. - Pulmonary arteries: PA peak pressure: 40 mm Hg (S). - Pericardium, extracardiac: A small pericardial effusion was identified posterior to the heart. There was a right pleural effusion. Impressions: - Mild LVH with LVEF approximately 35%. Hypokinesis of the mid to distal anteroseptal wall and periapical region noted. Indeterminate diastolic function. Severe left atrial enlargement. Mildly calcified mitral annulus with thickened leaflets and mild mitral regurgitation. Mild calcific aortic stenosis. Device wire noted within the right heart.. Mild tricuspid regurgitation with PASP estimated 40 mmHg. Small posterior pericardial effusion and right pleural effusion noted.  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 Pt has declined anticoag for atrial fib and declines beta blocker therapy, stating she had a severe reaction in the past unrelated to her HR. Recent OP adjustment of diuretics due to AKI with Cr up to 2.62 prompting d/c of metolazone, torsemide held x1 day then resumed at 89m BID.  Pt Admitted 04/02/17 for acute on chronic combined systolic and diastolic CHF.   Assessment & Plan    1. A/C Combined Systolic and Diastolic CHF: EF 354% Continues to diurese with  IV Lasix w/ an additional 2.1L out in UOP yesterday. I/Os net negative 3.4L. Lasix increased to TID dosing yesterday. Renal function stable. Medical management of her systolic HF has been limited>>no ACE/ARB or spironolactone given CKD and patient refuses to take beta blockers due to severe reactions in the past. We will continue Imdur and hydralazine. She is still volume overloaded and continues to have exertional dyspnea with short transfers from bed to chair. Previous weights reviewed. Her hospital d/c weight in May of this year was 253 lb. She is at 258 lb today. 259 yesterday. Renal function and K both stable. Continue IV diuretics. Continue strict I/Os, daily weights and low sodium diet. Daily  BMPs for monitoring of renal function and electrolytes.   2. Stage III  CKD: stable. Baseline SCr ~1.8-2.0. Scr has remained stable in the ~1.80 range over the last 3 days. Continue to monitor closely with diuresis.   3. Permanent Atrial Fibrillation: s/p AVN ablation. Medical management limited. Pt refuses anticoagulation as well as beta blocker therapy given history of intolerance. Not a good candidate for Cardizem given LV dysfunction, nor digoxin given CKD.    4. Cirrhosis: may also be contributing to edema, anemia, thrombocytopenia. Has fatty liver dz.   5. Anemia: slight drop in Hgb from 10.4>>9.8. We will continue to monitor.   6. Right Sided Pleural Effusion: noted on CXR 7/9. Lung exam notable for decreased breath sounds over RLL. Continue IV diuretics.   Signed, Lyda Jester, PA-C  04/06/2017, 8:12 AM    Personally seen and examined. Agree with above.  Still with SOB but improved.  RRR (paced), decreased BS at bases, edema noted  Acute systolic HF  - Lasix now 80 TID IV  - 3.7 out.   - Will add metolazone 2.5 QD (not to take as outpatient)  - Watch BMET.   - KCL 40 will give.   - 253 weight goal  Candee Furbish, MD

## 2017-04-07 LAB — BASIC METABOLIC PANEL
ANION GAP: 9 (ref 5–15)
BUN: 40 mg/dL — ABNORMAL HIGH (ref 6–20)
CALCIUM: 9.2 mg/dL (ref 8.9–10.3)
CO2: 32 mmol/L (ref 22–32)
Chloride: 98 mmol/L — ABNORMAL LOW (ref 101–111)
Creatinine, Ser: 1.96 mg/dL — ABNORMAL HIGH (ref 0.44–1.00)
GFR calc non Af Amer: 24 mL/min — ABNORMAL LOW (ref >60)
GFR, EST AFRICAN AMERICAN: 28 mL/min — AB (ref >60)
Glucose, Bld: 89 mg/dL (ref 65–99)
Potassium: 3.6 mmol/L (ref 3.5–5.1)
SODIUM: 139 mmol/L (ref 135–145)

## 2017-04-07 LAB — CBC WITH DIFFERENTIAL/PLATELET
BASOS ABS: 0 10*3/uL (ref 0.0–0.1)
BASOS PCT: 0 %
Eosinophils Absolute: 0.2 10*3/uL (ref 0.0–0.7)
Eosinophils Relative: 4 %
HEMATOCRIT: 31.5 % — AB (ref 36.0–46.0)
HEMOGLOBIN: 10.2 g/dL — AB (ref 12.0–15.0)
Lymphocytes Relative: 25 %
Lymphs Abs: 1.1 10*3/uL (ref 0.7–4.0)
MCH: 30.6 pg (ref 26.0–34.0)
MCHC: 32.4 g/dL (ref 30.0–36.0)
MCV: 94.6 fL (ref 78.0–100.0)
MONOS PCT: 9 %
Monocytes Absolute: 0.4 10*3/uL (ref 0.1–1.0)
NEUTROS ABS: 2.6 10*3/uL (ref 1.7–7.7)
NEUTROS PCT: 62 %
Platelets: 131 10*3/uL — ABNORMAL LOW (ref 150–400)
RBC: 3.33 MIL/uL — AB (ref 3.87–5.11)
RDW: 14.8 % (ref 11.5–15.5)
WBC: 4.2 10*3/uL (ref 4.0–10.5)

## 2017-04-07 MED ORDER — FUROSEMIDE 40 MG PO TABS: 60.0000 mg | ORAL_TABLET | Freq: Every evening | ORAL | Status: DC

## 2017-04-07 MED ORDER — TORSEMIDE 20 MG PO TABS: ORAL_TABLET | ORAL | 3 refills | Status: DC

## 2017-04-07 MED ORDER — TORSEMIDE 20 MG PO TABS: 60.0000 mg | ORAL_TABLET | Freq: Every evening | ORAL | Status: DC

## 2017-04-07 MED ORDER — FUROSEMIDE 40 MG PO TABS
40.0000 mg | ORAL_TABLET | Freq: Every day | ORAL | Status: DC
Start: 2017-04-07 — End: 2017-04-07

## 2017-04-07 MED ORDER — TORSEMIDE 20 MG PO TABS: 40.0000 mg | ORAL_TABLET | Freq: Every morning | ORAL | Status: DC

## 2017-04-07 NOTE — Progress Notes (Signed)
Pt got discharged to home, discharge instructions provided and patient showed understanding to it, IV taken out,Telemonitor DC,pt left unit in wheelchair with all of the belongings accompanied with a family member (Daughter)

## 2017-04-07 NOTE — Progress Notes (Signed)
Progress Note  Patient Name: [name] [name] Date of Encounter: 04/07/2017  Primary Cardiologist: Dr. Harrington Challenger   Subjective   Less SOB, She is excitied that she is at 253 pounds. Goal weight. Ready to go.   Inpatient Medications    Scheduled Meds: . allopurinol  100 mg Oral BID  . aspirin  325 mg Oral Daily  . calcitRIOL  0.25 mcg Oral QODAY  . calcium carbonate  1,250 mg Oral Daily  . ferrous sulfate  325 mg Oral QPM  . furosemide  80 mg Intravenous TID  . heparin  5,000 Units Subcutaneous Q8H  . hydrALAZINE  12.5 mg Oral TID  . HYDROcodone-acetaminophen  1 tablet Oral BID  . isosorbide mononitrate  30 mg Oral Daily  . nystatin   Topical QODAY  . pantoprazole  40 mg Oral Daily  . polyethylene glycol  17 g Oral Daily  . sodium chloride flush  3 mL Intravenous Q12H  . vitamin B-12  500 mcg Oral Daily   Continuous Infusions: . sodium chloride     PRN Meds: sodium chloride, acetaminophen, docusate sodium, ondansetron (ZOFRAN) IV, sodium chloride flush   Vital Signs    Vitals:   04/06/17 1204 04/06/17 1958 04/06/17 2340 04/07/17 0415  BP: 111/67 (!) 116/54  119/76  Pulse: 99 99 98 99  Resp: 18 20 18 20   Temp: 97.6 F (36.4 C) 97.8 F (36.6 C)  97.9 F (36.6 C)  TempSrc: Oral Oral  Oral  SpO2: 97% 97% 98% 97%  Weight:    253 lb 9.6 oz (115 kg)  Height:        Intake/Output Summary (Last 24 hours) at 04/07/17 0912 Last data filed at 04/07/17 0911  Gross per 24 hour  Intake              603 ml  Output             2950 ml  Net            -2347 ml   Filed Weights   04/05/17 0450 04/06/17 0400 04/07/17 0415  Weight: 259 lb 1.6 oz (117.5 kg) 258 lb 6.4 oz (117.2 kg) 253 lb 9.6 oz (115 kg)    Telemetry    Discontinued, permanent afib. - Personally Reviewed  ECG    V paced  - Personally Reviewed  Physical Exam   GEN: No acute distress.  Obese  Neck: No JVD Cardiac: irreguarlly irregular, regular rate, no murmurs, rubs, or gallops.  Respiratory:  Decreased BS on the RLL (pleural effusion) GI: Soft, nontender, non-distended  MS: 1+ bilateral LEE with chronic venous stasis dermatitis; No deformity. Neuro:  Nonfocal  Psych: Normal affect   Labs    Chemistry  Recent Labs Lab 04/05/17 0432 04/06/17 0250 04/07/17 0440  NA 139 138 139  K 3.7 3.6 3.6  CL 99* 98* 98*  CO2 31 32 32  GLUCOSE 88 103* 89  BUN 37* 36* 40*  CREATININE 1.86* 1.87* 1.96*  CALCIUM 8.9 8.9 9.2  GFRNONAA 26* 26* 24*  GFRAA 30* 30* 28*  ANIONGAP 9 8 9      Hematology  Recent Labs Lab 04/05/17 0432 04/06/17 0250 04/07/17 0440  WBC 4.0 5.0 4.2  RBC 3.42* 3.31* 3.33*  HGB 10.4* 9.8* 10.2*  HCT 32.6* 31.5* 31.5*  MCV 95.3 95.2 94.6  MCH 30.4 29.6 30.6  MCHC 31.9 31.1 32.4  RDW 14.9 14.8 14.8  PLT 107* 119* 131*    Cardiac EnzymesNo results  for input(s): TROPONINI in the last 168 hours.   Recent Labs Lab 04/02/17 1435  TROPIPOC 0.02     BNP  Recent Labs Lab 04/02/17 1418  BNP 160.0*     DDimer No results for input(s): DDIMER in the last 168 hours.   Radiology    No results found.  Cardiac Studies   2D echo 11/22/16  - Procedure narrative: Transthoracic echocardiography. Image quality was adequate. The study was technically difficult, as a result of body habitus. - Left ventricle: The cavity size was normal. Wall thickness was increased in a pattern of mild LVH. The estimated ejection fraction was 35%. There is hypokinesis of the mid-apicalanteroseptal and apical myocardium. The study is not technically sufficient to allow evaluation of LV diastolic function. - Aortic valve: Trileaflet; moderately calcified leaflets. There was mild stenosis. - Mitral valve: Calcified annulus. Mildly thickened leaflets . There was mild regurgitation. - Left atrium: The atrium was severely dilated. - Right ventricle: Pacer wire or catheter noted in right ventricle. - Right atrium: The atrium was mildly dilated. Central  venous pressure (est): 15 mm Hg. - Atrial septum: No defect or patent foramen ovale was identified. - Tricuspid valve: There was mild regurgitation. - Pulmonary arteries: PA peak pressure: 40 mm Hg (S). - Pericardium, extracardiac: A small pericardial effusion was identified posterior to the heart. There was a right pleural effusion. Impressions: - Mild LVH with LVEF approximately 35%. Hypokinesis of the mid to distal anteroseptal wall and periapical region noted. Indeterminate diastolic function. Severe left atrial enlargement. Mildly calcified mitral annulus with thickened leaflets and mild mitral regurgitation. Mild calcific aortic stenosis. Device wire noted within the right heart.. Mild tricuspid regurgitation with PASP estimated 40 mmHg. Small posterior pericardial effusion and right pleural effusion noted.  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 Pt has declined anticoag for atrial fib and declines beta blocker therapy, stating she had a severe reaction in the past unrelated to her HR. Recent OP adjustment of diuretics due to AKI with Cr up to 2.62 prompting d/c of metolazone, torsemide held x1 day then resumed at 41m BID.  Pt Admitted 04/02/17 for acute on chronic combined systolic and diastolic CHF.   Assessment & Plan    1. A/C Combined Systolic and Diastolic CHF: EF 312%  I/Os net negative 5.8L. Lasix at its max was 80 TID. Renal function relatively stable. Gave one dose of metolazone 2.5 yesterday to increase diuresis. She is not to take  as outpatient. She want to be on torsemide 40am and 60 pm. Will prescribe.  Medical management of her systolic HF has been limited>>no ACE/ARB or spironolactone given CKD and patient refuses to take beta blockers due to severe reactions in the past. We will continue Imdur and hydralazine. Her hospital d/c weight in May of this year was 253 lb.    2. Stage III CKD: stable. Baseline SCr ~1.8-2.0. Scr has remained stable in the ~1.80-1.9 range. Continue to monitor closely as outpatient.    3. Permanent Atrial Fibrillation: s/p AVN ablation. Medical management limited. Pt refuses anticoagulation as well as beta blocker therapy given history of intolerance. She takes full responsibly for  stroke risk.   Not a good candidate for Cardizem given LV dysfunction, nor digoxin given CKD.    4. Cirrhosis: may also be contributing to edema, anemia, thrombocytopenia. Has fatty liver dz. Discussed continued weight loss.   5. Anemia: slight drop in Hgb from 10.4>>9.8. We will continue to monitor.   6. Right Sided Pleural Effusion: noted on CXR 7/9. Lung exam notable for decreased breath sounds over RLL, improved. She was asking about repeating CXR before she goes home. I explained that clinically she is improved and we do not routinely repeat CXR's throughout admission.   OK for DC home. Follow up in 1-2 weeks, Dr. Harrington Challenger or APP. Let's try to keep her from readmit.     Signed, Candee Furbish, MD  04/07/2017, 9:12 AM

## 2017-04-07 NOTE — Progress Notes (Signed)
Patient has many questions about her plan of care, Nurse educated patient, but patient still has questions for MD's. Patient is concerned about heparin subq injections- caused a large bruise on the lower right abdomen with a "knot" on the top center of bruise.

## 2017-04-07 NOTE — Progress Notes (Signed)
RT came to place patient on BIPAP HS. Patient is already on mask and is tolerating well. No O2 bleed in needed.

## 2017-04-07 NOTE — Progress Notes (Signed)
Patient refused bed alarm, she says she always calls be fore she gets up.

## 2017-04-07 NOTE — Discharge Summary (Signed)
Discharge Summary    Patient ID: [name] [name],  MRN: [id number], DOB/AGE: [birth year] 72 y.o.  Admit date: 04/02/2017 Discharge date: 04/07/2017  Primary Care Provider: Dorthy Cooler, [name] Primary Cardiologist: Dr.  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 Cephalexin Shortness Of Breath    sob  . Codeine Anaphylaxis    REACTION: throat swelling  . Contrast Media [Iodinated Diagnostic Agents] Other (See Comments)    Patient states "I have chronic kidney disease so the doctor said no dye in my veins."  . Coreg [Carvedilol] Other (See Comments)    Beta Blockers cause her organs to shut down per patient  . Peppermint Flavor Shortness Of Breath  . Prednisone Anaphylaxis, Shortness Of Breath and Swelling    REACTION: swelling, S.O.B.  . Tape Other (See Comments)    States plastic tape blisters her skin  . Ciprofloxacin Hives  . Latex Itching and Rash  . Penicillins Hives    Diagnostic Studies/Procedures    None   History of Present Illness     71 y.o.femalewith permanent AF s/p AVN ablation and pacemaker (patient has refused anticoagulation, CHADSVASC 4), chronic combined CHF (EF 35% by echo 10/2016), NICM, normal coronary angiography 2009, cirrhosis presumed due to fatty liver disease, CKD stage IV (baseline appears 1.8-2.0), OSA, HTN, anemia, cervical ca s/p hysterectomy, chronic LE edema, limited mobility, chronic-appearing anemia/thrombocytopenia, mild AS/MR/TR by echo 10/2016 here with weight gain and SOB.   Pt has declined  anticoag for atrial fib and declines beta blocker therapy, stating she had a severe reaction in the past unrelated to her HR. Recent OP adjustment of diuretics due to AKI with Cr up to 2.62 prompting d/c of metolazone, torsemide held x1 day then resumed at 42m BID.  Pt Admitted 04/02/17 for acute on chronic combined systolic and diastolic CHF.   Hospital Course     Consultants: None  1. A/C Combined Systolic and Diastolic CHF: EF 348%  I/Os net negative 5.8L on IV lasix 80 TID. Renal function relatively stable. Gave one dose of metolazone 2.5 7/13  to increase diuresis. She is not to take as outpatient. She want to be on torsemide 40am and 60 pm. Will prescribe.  Medical management of her systolic HF has been limited>>no ACE/ARB or spironolactone given CKD and patient refuses to take beta blockers due to severe reactions in the past. We will continue Imdur and hydralazine. Her hospital d/c weight in May of this year was 253 lb. Weight today is 253lb.   2. Stage III CKD: stable. Baseline SCr ~1.8-2.0. Scr has remained stable in the ~1.80-1.9 range. Continue to monitor closely as outpatient.    3. Permanent Atrial Fibrillation: s/p AVN ablation. Medical management limited. Pt refuses anticoagulation as well as beta blocker therapy given history of intolerance. She takes full responsibly for stroke risk.   Not a good candidate for Cardizem given LV dysfunction, nor digoxin given CKD.   4. Cirrhosis: may also be contributing to edema, anemia, thrombocytopenia. Has fatty liver dz. Discussed continued weight loss.  5. Anemia: slight drop in Hgb from 10.4>>9.8. We will continue to monitor.   6. Right Sided Pleural Effusion: noted on CXR 7/9. Lung exam notable for decreased breath sounds over RLL, improved. She was asking about repeating CXR before she goes home. I explained that clinically she is improved and we do not routinely repeat CXR's throughout admission.    The patient has been seen by  Dr. Marlou Porch  today and deemed ready for discharge home. All follow-up appointments have been scheduled. Discharge medications are listed below.    Discharge Vitals Blood pressure 119/76, pulse 99, temperature 97.9 F (36.6 C), temperature source Oral, resp. rate 20, height 5' 6"  (1.676 m), weight 253 lb 9.6 oz (115 kg), SpO2 97 %.  Filed Weights   04/05/17 0450 04/06/17 0400 04/07/17 0415  Weight: 259 lb 1.6 oz (117.5 kg) 258 lb 6.4 oz (117.2 kg) 253 lb 9.6 oz (115 kg)    Labs & Radiologic Studies     CBC  Recent Labs  04/06/17 0250 04/07/17 0440  WBC 5.0 4.2  NEUTROABS 3.4 2.6  HGB 9.8* 10.2*  HCT 31.5* 31.5*  MCV 95.2 94.6  PLT 119* 676*   Basic Metabolic Panel  Recent Labs  04/06/17 0250 04/07/17 0440  NA 138 139  K 3.6 3.6  CL 98* 98*  CO2 32 32  GLUCOSE 103* 89  BUN 36* 40*  CREATININE 1.87* 1.96*  CALCIUM 8.9 9.2    Dg Chest 2 View  Result Date: 04/02/2017 CLINICAL DATA:  Dyspnea and chest pain EXAM: CHEST  2 VIEW COMPARISON:  02/17/2017 chest radiograph. FINDINGS: Stable configuration of single lead left subclavian pacemaker. Stable cardiomediastinal silhouette with mild-to-moderate cardiomegaly and aortic atherosclerosis. No pneumothorax. Small right pleural effusion. No left pleural effusion. Mild pulmonary edema. Patchy right lung base opacity. IMPRESSION: 1. Mild congestive heart failure. 2. Small right pleural effusion. 3. Patchy right lung base opacity, favor atelectasis. Electronically Signed   By: Ilona Sorrel M.D.   On: 04/02/2017 15:04    Disposition   Pt is being discharged home today in good condition.  Follow-up Plans & Appointments    Follow-up Information    Fay Records, MD Follow up.   Specialty:  Cardiology Why:  office will call with time and date. If you did not heard by Tuesday, please call  Contact information: [place] [address] [phone]          Discharge Instructions    Diet - low  sodium heart healthy    Complete by:  As directed    Discharge instructions    Complete by:  As directed    *Weigh yourself on the same scale at same time of day and keep a log. *Report weight gain of > 2 lbs in 1 day or 5 lbs over the course of a week and/or symptoms of excess fluid (shortness of breath, difficulty lying flat, swelling, poor appetite, abdominal fullness/bloating, etc) to your doctor immediately. *Avoid foods that are high in sodium (processed, pre-packaged/canned goods, fast foods, etc). *Please attend all scheduled and reccommended follow up appointments   Increase activity slowly    Complete by:  As directed       Discharge Medications   Current Discharge Medication List    CONTINUE these medications which have CHANGED   Details  torsemide (DEMADEX) 20 MG tablet Take 2 tables (59m) in morning  and 3 tables (666m in evening by mouth Qty: 150 tablet, Refills:  3      CONTINUE these medications which have NOT CHANGED   Details  allopurinol (ZYLOPRIM) 100 MG tablet Take 100 mg by mouth 2 (two) times daily.    aspirin 325 MG tablet Take 325 mg by mouth daily.      calcitRIOL (ROCALTROL) 0.25 MCG capsule Take 0.25 mcg by mouth every other day.     calcium carbonate (OS-CAL) 600 MG tablet Take 600 mg by mouth daily.    COD LIVER OIL PO Take 1 tablet by mouth daily.      docusate sodium (COLACE) 100 MG capsule Take 100 mg by mouth daily as needed for mild constipation.    ferrous sulfate 325 (65 FE) MG tablet Take 325 mg by mouth every evening.     hydrALAZINE (APRESOLINE) 25 MG tablet Take 0.5 tablets (12.5 mg total) by mouth 3 (three) times daily. Qty: 45 tablet, Refills: 11    HYDROcodone-acetaminophen (NORCO) 10-325 MG per tablet Take 1 tablet by mouth 2 (two) times daily.     isosorbide mononitrate (IMDUR) 30 MG 24 hr tablet Take 1 tablet (30 mg total) by mouth daily. Qty: 90 tablet, Refills: 3    Multiple Vitamin (MULTIVITAMIN) tablet Take 1 tablet by  mouth daily.    nystatin (MYCOSTATIN/NYSTOP) powder Apply 1 application topically every other day.    nystatin-triamcinolone ointment (MYCOLOG) Apply topically 2 (two) times daily. Qty: 30 g, Refills: 0    omeprazole (PRILOSEC) 20 MG capsule Take 20 mg by mouth daily.      potassium chloride SA (K-DUR,KLOR-CON) 20 MEQ tablet Take 1 tablet (20 mEq total) by mouth 2 (two) times daily. Qty: 180 tablet, Refills: 3    vitamin B-12 (CYANOCOBALAMIN) 500 MCG tablet Take 500 mcg by mouth daily.    Glycerin, Adult, 2.1 g SUPP Place 1 suppository rectally daily as needed for moderate constipation. Refills: 0          Outstanding Labs/Studies   BMET and CBC at follow up. Duration of Discharge Encounter   Greater than 30 minutes including physician time.  Signed, Bhagat,Bhavinkumar PA-C 04/07/2017, 11:22 AM  Personally seen and examined. Agree with above.  Primary Cardiologist: Dr. Harrington Challenger   Subjective   Less SOB, She is excitied that she is at 253 pounds. Goal weight. Ready to go.   Inpatient Medications    Scheduled Meds: . allopurinol  100 mg Oral BID  . aspirin  325 mg Oral Daily  . calcitRIOL  0.25 mcg Oral QODAY  . calcium carbonate  1,250 mg Oral Daily  . ferrous sulfate  325 mg Oral QPM  . furosemide  80 mg Intravenous TID  . heparin  5,000 Units Subcutaneous Q8H  . hydrALAZINE  12.5 mg Oral TID  . HYDROcodone-acetaminophen  1 tablet Oral BID  . isosorbide mononitrate  30 mg Oral Daily  . nystatin   Topical QODAY  . pantoprazole  40 mg Oral Daily  . polyethylene glycol  17 g Oral Daily  . sodium chloride flush  3 mL Intravenous Q12H  . vitamin B-12  500 mcg Oral Daily   Continuous Infusions: . sodium chloride     PRN Meds: sodium chloride, acetaminophen, docusate sodium, ondansetron (ZOFRAN) IV, sodium chloride flush   Vital Signs          Vitals:   04/06/17 1204 04/06/17 1958 04/06/17 2340 04/07/17 0415  BP: 111/67 (!) 116/54  119/76  Pulse: 99 99  98 99  Resp: 18 20 18 20   Temp: 97.6  F (36.4 C) 97.8 F (36.6 C)  97.9 F (36.6 C)  TempSrc: Oral Oral  Oral  SpO2: 97% 97% 98% 97%  Weight:    253 lb 9.6 oz (115 kg)  Height:        Intake/Output Summary (Last 24 hours) at 04/07/17 0912 Last data filed at 04/07/17 0911  Gross per 24 hour  Intake              603 ml  Output             2950 ml  Net            -2347 ml        Filed Weights   04/05/17 0450 04/06/17 0400 04/07/17 0415  Weight: 259 lb 1.6 oz (117.5 kg) 258 lb 6.4 oz (117.2 kg) 253 lb 9.6 oz (115 kg)    Telemetry    Discontinued, permanent afib. - Personally Reviewed  ECG    V paced  - Personally Reviewed  Physical Exam   GEN:No acute distress.  Obese  Neck:No JVD Cardiac:irreguarlly irregular, regular rate, no murmurs, rubs, or gallops.  Respiratory:Decreased BS on the RLL (pleural effusion) XK:PVVZ, nontender, non-distended  MS:1+ bilateral LEE with chronic venous stasis dermatitis; No deformity. Neuro:Nonfocal  Psych: Normal affect   Labs    Chemistry  Last Labs    Recent Labs Lab 04/05/17 0432 04/06/17 0250 04/07/17 0440  NA 139 138 139  K 3.7 3.6 3.6  CL 99* 98* 98*  CO2 31 32 32  GLUCOSE 88 103* 89  BUN 37* 36* 40*  CREATININE 1.86* 1.87* 1.96*  CALCIUM 8.9 8.9 9.2  GFRNONAA 26* 26* 24*  GFRAA 30* 30* 28*  ANIONGAP 9 8 9        Hematology  Last Labs    Recent Labs Lab 04/05/17 0432 04/06/17 0250 04/07/17 0440  WBC 4.0 5.0 4.2  RBC 3.42* 3.31* 3.33*  HGB 10.4* 9.8* 10.2*  HCT 32.6* 31.5* 31.5*  MCV 95.3 95.2 94.6  MCH 30.4 29.6 30.6  MCHC 31.9 31.1 32.4  RDW 14.9 14.8 14.8  PLT 107* 119* 131*      Cardiac Enzymes Last Labs   No results for input(s): TROPONINI in the last 168 hours.     Last Labs    Recent Labs Lab 04/02/17 1435  TROPIPOC 0.02       BNP  Last Labs    Recent Labs Lab 04/02/17 1418  BNP 160.0*       DDimer  Last Labs   No results for  input(s): DDIMER in the last 168 hours.     Radiology    Imaging Results (Last 48 hours)  No results found.    Cardiac Studies   2D echo 11/22/16  - Procedure narrative: Transthoracic echocardiography. Image quality was adequate. The study was technically difficult, as a result of body habitus. - Left ventricle: The cavity size was normal. Wall thickness was increased in a pattern of mild LVH. The estimated ejection fraction was 35%. There is hypokinesis of the mid-apicalanteroseptal and apical myocardium. The study is not technically sufficient to allow evaluation of LV diastolic function. - Aortic valve: Trileaflet; moderately calcified leaflets. There was mild stenosis. - Mitral valve: Calcified annulus. Mildly thickened leaflets . There was mild regurgitation. - Left atrium: The atrium was severely dilated. - Right ventricle: Pacer wire or catheter noted in right ventricle. - Right atrium: The atrium was mildly dilated. Central venous pressure (est): 15 mm Hg. -  Atrial septum: No defect or patent foramen ovale was identified. - Tricuspid valve: There was mild regurgitation. - Pulmonary arteries: PA peak pressure: 40 mm Hg (S). - Pericardium, extracardiac: A small pericardial effusion was identified posterior to the heart. There was a right pleural effusion. Impressions: - Mild LVH with LVEF approximately 35%. Hypokinesis of the mid to distal anteroseptal wall and periapical region noted. Indeterminate diastolic function. Severe left atrial enlargement. Mildly calcified mitral annulus with thickened leaflets and mild mitral regurgitation. Mild calcific aortic stenosis. Device wire noted within the right heart.. Mild tricuspid regurgitation with PASP estimated 40 mmHg. Small posterior pericardial effusion and right pleural effusion noted.  Patient Profile     71 y.o.femalewith permanent AF s/p AVN ablation and pacemaker  (patient has refused anticoagulation, CHADSVASC 4), chronic combined CHF (EF 35% by echo 10/2016), NICM, normal coronary angiography 2009, cirrhosis presumed due to fatty liver disease, CKD stage IV (baseline appears 1.8-2.0), OSA, HTN, anemia, cervical ca s/p hysterectomy, chronic LE edema, limited mobility, chronic-appearing anemia/thrombocytopenia, mild AS/MR/TR by echo 10/2016. Pt has declined anticoag for atrial fib and declines beta blocker therapy, stating she had a severe reaction in the past unrelated to her HR. Recent OP adjustment of diuretics due to AKI with Cr up to 2.62 prompting d/c of metolazone, torsemide held x1 day then resumed at 70m BID.  Pt Admitted 04/02/17 for acute on chronic combined systolic and diastolic CHF.   Assessment & Plan    1. A/C Combined Systolic and Diastolic CHF: EF 344%  I/Os net negative 5.8L. Lasix at its max was 80 TID. Renal function relatively stable. Gave one dose of metolazone 2.5 yesterday to increase diuresis. She is not to take as outpatient. She want to be on torsemide 40am and 60 pm. Will prescribe.  Medical management of her systolic HF has been limited>>no ACE/ARB or spironolactone given CKD and patient refuses to take beta blockers due to severe reactions in the past. We will continue Imdur and hydralazine. Her hospital d/c weight in May of this year was 253 lb.   2. Stage III CKD: stable. Baseline SCr ~1.8-2.0. Scr has remained stable in the ~1.80-1.9 range. Continue to monitor closely as outpatient.    3. Permanent Atrial Fibrillation: s/p AVN ablation. Medical management limited. Pt refuses anticoagulation as well as beta blocker therapy given history of intolerance. She takes full responsibly for stroke risk.   Not a good candidate for Cardizem given LV dysfunction, nor digoxin given CKD.   4. Cirrhosis: may also be contributing to edema, anemia, thrombocytopenia. Has fatty liver dz. Discussed continued weight loss.   5. Anemia: slight  drop in Hgb from 10.4>>9.8. We will continue to monitor.   6. Right Sided Pleural Effusion: noted on CXR 7/9. Lung exam notable for decreased breath sounds over RLL, improved. She was asking about repeating CXR before she goes home. I explained that clinically she is improved and we do not routinely repeat CXR's throughout admission.   OK for DC home. Follow up in 1-2 weeks, Dr. RHarrington Challengeror APP. Let's try to keep her from readmit.     Signed, MCandee Furbish MD

## 2017-04-09 ENCOUNTER — Telehealth: Payer: Self-pay | Admitting: Internal Medicine

## 2017-04-09 NOTE — Telephone Encounter (Signed)
Called, spoke with pt. Pt stated her weight today Saturday (04/07/17) was 253.8 lbs and today is 256 lbs. Pt stated she has had lower output since d/c from hospital on 7/14. Pt stated she was told to call if weight increases greater than 3 pounds in 2 days. I read d/c instructions from 04/07/17. Note from d/c printout : Call your doctor: (Anytime you feel any of the following symptoms) 3-4 pound weight gain in 1-2 days or 2 pounds overnight; Shortness of breath, with or without a dry hacking cough; Swelling in the hands, feet or stomach; If you have to sleep on extra pillows at night in order to breathe. Informed will forward to MD to review.

## 2017-04-09 NOTE — Telephone Encounter (Signed)
Dr. Harrington Challenger not in the office. Informed Dr. Marlou Porch (saw pt in hospital) recommended pt keep taking medications as directed and f/u in 1 week in our office. Pt stated she can't come next week. Needs to be the week after. Informed someone will contact pt to schedule. Pt verbalized understanding.

## 2017-04-09 NOTE — Telephone Encounter (Signed)
New Message  Pt call requesting to speak with RN. Pt states when leaving the hospital she weighted 253.6lbs. Pt states this morning she weighted in at 256. Pt would like to speak with RN about weight gain. Please call back to discuss

## 2017-04-13 DIAGNOSIS — M109 Gout, unspecified: Secondary | ICD-10-CM | POA: Diagnosis not present

## 2017-04-13 DIAGNOSIS — Z87440 Personal history of urinary (tract) infections: Secondary | ICD-10-CM | POA: Diagnosis not present

## 2017-04-13 DIAGNOSIS — N2581 Secondary hyperparathyroidism of renal origin: Secondary | ICD-10-CM | POA: Diagnosis not present

## 2017-04-13 DIAGNOSIS — G4733 Obstructive sleep apnea (adult) (pediatric): Secondary | ICD-10-CM | POA: Diagnosis not present

## 2017-04-13 DIAGNOSIS — I129 Hypertensive chronic kidney disease with stage 1 through stage 4 chronic kidney disease, or unspecified chronic kidney disease: Secondary | ICD-10-CM | POA: Diagnosis not present

## 2017-04-13 DIAGNOSIS — N184 Chronic kidney disease, stage 4 (severe): Secondary | ICD-10-CM | POA: Diagnosis not present

## 2017-04-13 DIAGNOSIS — N200 Calculus of kidney: Secondary | ICD-10-CM | POA: Diagnosis not present

## 2017-04-13 DIAGNOSIS — I480 Paroxysmal atrial fibrillation: Secondary | ICD-10-CM | POA: Diagnosis not present

## 2017-04-13 DIAGNOSIS — R609 Edema, unspecified: Secondary | ICD-10-CM | POA: Diagnosis not present

## 2017-04-13 DIAGNOSIS — D631 Anemia in chronic kidney disease: Secondary | ICD-10-CM | POA: Diagnosis not present

## 2017-04-13 DIAGNOSIS — N390 Urinary tract infection, site not specified: Secondary | ICD-10-CM | POA: Diagnosis not present

## 2017-04-13 DIAGNOSIS — M1990 Unspecified osteoarthritis, unspecified site: Secondary | ICD-10-CM | POA: Diagnosis not present

## 2017-04-13 DIAGNOSIS — Z950 Presence of cardiac pacemaker: Secondary | ICD-10-CM | POA: Diagnosis not present

## 2017-04-20 ENCOUNTER — Ambulatory Visit: Payer: Medicare Other | Admitting: Physician Assistant

## 2017-04-26 ENCOUNTER — Telehealth: Payer: Self-pay | Admitting: Internal Medicine

## 2017-04-26 NOTE — Telephone Encounter (Signed)
Dr. Jimmy Footman discontinued and ncreased torsemide from 40 BID to 40 AM 60 PM every day on 04/13/17.  Weight that day was 256.6.  Not due to see him again for 3 months but having labs for him on 8/23.   Appt with Dr. Harrington Challenger next Friday.  Weight today is 263.0  this am.  This is a very gradual increase over the last 12 days.   Breathing is ok at rest.  With exertion -able to cook and care for her self with rest periods due to SOB.  Has been limiting fluids to 46-56 oz per day at most.  Eating all fresh vegetables, fresh fruit, boost, meats.  Very limited amounts of sodium daily.  Elevates legs several times daily.  No real difference in swelling in legs, no noted abdominal tightness.  She is aware I will forward to Dr. Harrington Challenger for recommendations and give her a call back.

## 2017-04-26 NOTE — Telephone Encounter (Signed)
New message    Pt is calling to report she has gained weight since hospital discharged. 253 when she left and today she weighs 263.

## 2017-04-27 NOTE — Telephone Encounter (Signed)
Please get labs and clinic appt note from Dr Deterdings office

## 2017-04-27 NOTE — Telephone Encounter (Signed)
Requested clinic note and labs from Dr. Deterding's office to be faxed to [phone].

## 2017-05-04 ENCOUNTER — Encounter: Payer: Self-pay | Admitting: Physician Assistant

## 2017-05-04 ENCOUNTER — Ambulatory Visit (INDEPENDENT_AMBULATORY_CARE_PROVIDER_SITE_OTHER): Payer: Medicare Other | Admitting: Physician Assistant

## 2017-05-04 VITALS — BP 102/74 | HR 95 | Ht 66.0 in | Wt 263.2 lb

## 2017-05-04 DIAGNOSIS — Z950 Presence of cardiac pacemaker: Secondary | ICD-10-CM | POA: Diagnosis not present

## 2017-05-04 DIAGNOSIS — N1832 Chronic kidney disease, stage 3b: Secondary | ICD-10-CM

## 2017-05-04 DIAGNOSIS — I4821 Permanent atrial fibrillation: Secondary | ICD-10-CM

## 2017-05-04 DIAGNOSIS — I5043 Acute on chronic combined systolic (congestive) and diastolic (congestive) heart failure: Secondary | ICD-10-CM | POA: Diagnosis not present

## 2017-05-04 DIAGNOSIS — N183 Chronic kidney disease, stage 3 (moderate): Secondary | ICD-10-CM | POA: Diagnosis not present

## 2017-05-04 DIAGNOSIS — I482 Chronic atrial fibrillation: Secondary | ICD-10-CM

## 2017-05-04 MED ORDER — TORSEMIDE 20 MG PO TABS: 80.0000 mg | ORAL_TABLET | Freq: Two times a day (BID) | ORAL | 3 refills | Status: DC

## 2017-05-04 MED ORDER — ISOSORBIDE MONONITRATE ER 30 MG PO TB24: 15.0000 mg | ORAL_TABLET | Freq: Every day | ORAL | 3 refills | Status: DC

## 2017-05-04 NOTE — Patient Instructions (Addendum)
Medication Instructions:  1. INCREASE TORSEMIDE TO 80 MG TWICE DAILY (THIS WILL BE 4 TABLETS IN THE AM AND 4 TABS IN THE PM)  2. DECREASE IMDUR TO 15 MG DAILY; (THIS WILL BE 1/2 TABLET DAILY OF THE 30 MG TABLET)  Labwork: YOU HAVE BEEN GIVEN AN RX FOR LAB WORK TO BE DONE IN 1 WEEK; (BMET); PLEASE MAKE SURE THE RESULTS HAVE BEEN FAXED TO [place], [facility] [phone]  Testing/Procedures: NONE ORDERED  Follow-Up: 05/25/17 @ 11:45 WITH SCOTT WEAVER, PAC   Any Other Special Instructions Will Be Listed Below (If Applicable).     If you need a refill on your cardiac medications before your next appointment, please call your pharmacy.

## 2017-05-04 NOTE — Progress Notes (Addendum)
Cardiology Office Note:    Date:  05/04/2017   ID:  [name] [name], DOB [birth year], MRN [id number]  PCP:  [name], MD  Cardiologist:  Dr. Dorris Carnes   Electrophysiologist: Dr. Cristopher Peru  Nephrologist: Dr. Jimmy Footman    Referring MD: [name], MD   Chief Complaint  Patient presents with  . Hospitalization Follow-up    Admitted with CHF    History of Present Illness:    [name] [name] is a 71 y.o. 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  Her CHADS2-VASc=4 (HTN, CHF, 71 yo, female).  But, she has refused anticoagulation.  She was admitted in 5/18 for decompensated HF.  She was last seen in the office in 6/18 for post hospitalization follow up.   She was admitted 7/9-7/14 with a/c combined systolic and diastolic CHF with assoc small R pleural effusion.  DC weight was 253.  She saw Dr. Jimmy Footman in late July and he stopped her Hydralazine due to low BP.    [name] [name] returns for follow-up. She is here with her daughter. She has noted gradual weight gain over the past few weeks. She has recently taken extra torsemide with minimal weight loss. Her breathing has worsened. She notes chest tightness with excess volume. She denies PND. She denies significant edema. She denies syncope. She denies any bleeding issues.  Prior CV studies:   The following studies were reviewed today:  Echo 11/22/16 Mild LVH, EF 35, ant-septal and apicalHK, mild aortic stenosis, MAC, mild MR, severe LAE, mild RAE, mild TR, PASP 40, small pericardial effusion, R pleural effusion  Renal Art [place] 08/2014 Limited visualization; no obvious RA stenosis  LHC 06/2008 Normal coronary angiography   Past Medical History:  Diagnosis Date  . Anemia   . Atrial fibrillation ([place])   . Bursitis   . Cancer ([place])   . Cataract   . Cervical  cancer ([place]) 1991   s/p hysterectomy  . Chronic cellulitis   . Chronic combined systolic and diastolic congestive heart failure, NYHA class 2 (HCC)    LVEF 25-30% with restrictive diastolic filling  . Degenerative joint disease   . Diverticulitis   . Essential hypertension   . Gallstones   . GERD (gastroesophageal reflux disease)   . Gout   . Kidney stones   . Morbid obesity ([place])   . NICM (nonischemic cardiomyopathy) ([place]) 02/22/2017  . Osteoarthritis   . Permanent atrial fibrillation ([place]) 05/04/2009   Qualifier: History of  By: Quentin Cornwall CMA, Janett Billow    . 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 Lovena Le  . Sleep apnea    CPAP    Past Surgical History:  Procedure Laterality Date  . ABDOMINAL HYSTERECTOMY  1991  . COLONOSCOPY  1998   one polyp per patient  . EYE SURGERY    . PACEMAKER INSERTION  Nov 2000   St Jude with revision in 2011  . PERCUTANEOUS NEPHROLITHOTOMY  April 2012    Current Medications: Current Meds  Medication Sig  . allopurinol (ZYLOPRIM) 100 MG tablet Take 100 mg by mouth 2 (two) times daily.  Marland Kitchen aspirin 325 MG tablet Take 325 mg by mouth daily.    . calcitRIOL (ROCALTROL) 0.25  MCG capsule Take 0.25 mcg by mouth every other day.   . calcium carbonate (OS-CAL) 600 MG tablet Take 600 mg by mouth daily.  . COD LIVER OIL PO Take 1 tablet by mouth daily.    Marland Kitchen docusate sodium (COLACE) 100 MG capsule Take 100 mg by mouth daily as needed for mild constipation.  . ferrous sulfate 325 (65 FE) MG tablet Take 325 mg by mouth every evening.   . Glycerin, Adult, 2.1 g SUPP Place 1 suppository rectally daily as needed for moderate constipation.  Marland Kitchen HYDROcodone-acetaminophen (NORCO) 10-325 MG per tablet Take 1 tablet by mouth 2 (two) times daily.   . Multiple Vitamin (MULTIVITAMIN) tablet Take 1 tablet by mouth daily.  Marland Kitchen nystatin  (MYCOSTATIN/NYSTOP) powder Apply 1 application topically every other day.  . nystatin-triamcinolone ointment (MYCOLOG) Apply topically 2 (two) times daily.  Marland Kitchen omeprazole (PRILOSEC) 20 MG capsule Take 20 mg by mouth daily.    . potassium chloride SA (K-DUR,KLOR-CON) 20 MEQ tablet Take 1 tablet (20 mEq total) by mouth 2 (two) times daily.  . vitamin B-12 (CYANOCOBALAMIN) 500 MCG tablet Take 500 mcg by mouth daily.  . [DISCONTINUED] isosorbide mononitrate (IMDUR) 30 MG 24 hr tablet Take 1 tablet (30 mg total) by mouth daily.  . [DISCONTINUED] torsemide (DEMADEX) 20 MG tablet Take 2 tables (43m) in morning  and 3 tables (654m in evening by mouth     Allergies:   Cephalexin; Codeine; Contrast media [iodinated diagnostic agents]; Coreg [carvedilol]; Peppermint flavor; Prednisone; Tape; Ciprofloxacin; Latex; and Penicillins   Social History   Social History  . Marital status: Widowed    Spouse name: N/A  . Number of children: N/A  . Years of education: N/A   Social History Main Topics  . Smoking status: Former Smoker    Packs/day: 1.00    Types: Cigarettes    Quit date: 09/26/1979  . Smokeless tobacco: Never Used  . Alcohol use No  . Drug use: No  . Sexual activity: No   Other Topics Concern  . None   Social History Narrative  . None     Family Hx: The patient's family history includes Colon cancer in her other; Diabetes in her sister; Heart attack in her father; Stroke in her mother. There is no history of Liver disease or Other.  ROS:   Please see the history of present illness.    Review of Systems  Constitution: Positive for weight gain.  Cardiovascular: Positive for chest pain.  Respiratory: Positive for cough and shortness of breath.   Gastrointestinal: Negative for hematochezia and melena.  Neurological: Negative for dizziness.   All other systems reviewed and are negative.   EKGs/Labs/Other Test Reviewed:    EKG:  EKG is  ordered today.  The ekg ordered today  demonstrates Ventricular paced, HR 95   Recent Labs: 11/25/2016: Magnesium 2.2 02/16/2017: ALT 19; TSH 4.274 04/02/2017: B Natriuretic Peptide 160.0 04/07/2017: BUN 40; Creatinine, Ser 1.96; Hemoglobin 10.2; Platelets 131; Potassium 3.6; Sodium 139   Recent Lipid Panel Lab Results  Component Value Date/Time   CHOL 142 12/14/2014 03:06 PM   TRIG 141.0 12/14/2014 03:06 PM   HDL 45.90 12/14/2014 03:06 PM   CHOLHDL 3 12/14/2014 03:06 PM   LDLCALC 68 12/14/2014 03:06 PM    Physical Exam:    VS:  BP 102/74   Pulse 95   Ht 5' 6"  (1.676 m)   Wt 263 lb 3.2 oz (119.4 kg)   SpO2 96%   BMI 42.48  kg/m     Wt Readings from Last 3 Encounters:  05/04/17 263 lb 3.2 oz (119.4 kg)  04/07/17 253 lb 9.6 oz (115 kg)  02/26/17 257 lb 12.8 oz (116.9 kg)     Physical Exam  Constitutional: She is oriented to person, place, and time. She appears well-developed and well-nourished. No distress.  HENT:  Head: Normocephalic and atraumatic.  Eyes: No scleral icterus.  Neck: JVD: I cannot assess JVD.  Cardiovascular: Normal rate and regular rhythm.   Murmur heard.  Systolic murmur is present with a grade of 2/6  at the lower left sternal border Pulmonary/Chest: She has no wheezes. She has no rhonchi. Rales: Faint crackles at the bases bilaterally.  Abdominal: Soft.  Musculoskeletal: She exhibits edema (trace bilateral ankle edema).  Neurological: She is alert and oriented to person, place, and time.  Skin: Skin is warm and dry.  Psychiatric: She has a normal mood and affect.    ASSESSMENT:    1. Acute on chronic combined systolic and diastolic CHF (congestive heart failure) ([place])   2. CKD stage G3b/A1, GFR 30-44 and albumin creatinine ratio <30 mg/g   3. Permanent atrial fibrillation ([place])   4. Morbid obesity (HCC) Chronic  5. Cardiac pacemaker in situ    PLAN:    In order of problems listed above:  1. Acute on chronic combined systolic and diastolic CHF (congestive heart failure) ([place]) -    She brings in a detailed listing of her weights. It appears that her weight is approaching her preadmission weight in July. She feels that her breathing has worsened. Her exam is difficult.  She has required higher doses of Torsemide at home. She wants to avoid admission to the hospital. We discussed the addition of metolazone versus increasing her dose of torsemide. She prefers to increase her dose of torsemide first. I agree with this approach. Of note, her blood pressure continues to run low. She is not symptomatic with this.  Her nephrologist stopped her hydralazine.  -  Decrease Isosorbide to 15 mg daily  -  Increase torsemide 80 mg twice a day  -  BMET today and in 1 week  -  She will call with her weights early next week  -  Consider one dose of metolazone if her diuresis does not respond higher dose torsemide  2. CKD stage G3b/A1, GFR 30-44 and albumin creatinine ratio <30 mg/g Continue follow-up with nephrology. Monitor renal function and potassium closely with adjustments in her diuretic.  3. Permanent atrial fibrillation ([place]) She has refused anticoagulation and beta blockers in the past.  4. Morbid obesity  She is fairly sedentary and uses a wheelchair.  5. Pacemaker Follow up with EP as planned  Dispo:  Return in about 3 weeks (around 05/25/2017) for Close Follow Up, w/ Richardson Dopp, PA-C.   Medication Adjustments/Labs and Tests Ordered: Current medicines are reviewed at length with the patient today.  Concerns regarding medicines are outlined above.  Tests Ordered: Orders Placed This Encounter  Procedures  . Basic metabolic panel  . EKG 12-Lead   Medication Changes: Meds ordered this encounter  Medications  . torsemide (DEMADEX) 20 MG tablet    Sig: Take 4 tablets (80 mg total) by mouth 2 (two) times daily.    Dispense:  672 tablet    Refill:  3    DOSE INCREASE  . isosorbide mononitrate (IMDUR) 30 MG 24 hr tablet    Sig: Take 0.5 tablets (15 mg total) by mouth  daily.    Dispense:  90 tablet    Refill:  3    Signed, Richardson Dopp, PA-C  05/04/2017 1:37 PM    [facility] [address], [place], [place]  [address] Phone: [phone]; Fax: [phone]

## 2017-05-04 NOTE — Addendum Note (Signed)
Addended byKathlen Mody, Nicki Reaper T on: 05/04/2017 01:37 PM   Modules accepted: Orders

## 2017-05-04 NOTE — Telephone Encounter (Signed)
I have not received records from kidney doctor's office.   I was in the middle of leaving patient a message to get update on how she is doing when I realized she had come to ov today with APP.

## 2017-05-05 LAB — BASIC METABOLIC PANEL
BUN / CREAT RATIO: 25 (ref 12–28)
BUN: 48 mg/dL — AB (ref 8–27)
CHLORIDE: 97 mmol/L (ref 96–106)
CO2: 31 mmol/L — ABNORMAL HIGH (ref 20–29)
Calcium: 9.8 mg/dL (ref 8.7–10.3)
Creatinine, Ser: 1.91 mg/dL — ABNORMAL HIGH (ref 0.57–1.00)
GFR, EST AFRICAN AMERICAN: 30 mL/min/{1.73_m2} — AB (ref >59)
GFR, EST NON AFRICAN AMERICAN: 26 mL/min/{1.73_m2} — AB (ref >59)
Glucose: 72 mg/dL (ref 65–99)
Potassium: 4.8 mmol/L (ref 3.5–5.2)
Sodium: 145 mmol/L — ABNORMAL HIGH (ref 134–144)

## 2017-05-07 ENCOUNTER — Telehealth: Payer: Self-pay | Admitting: Physician Assistant

## 2017-05-07 ENCOUNTER — Encounter: Payer: Self-pay | Admitting: Physician Assistant

## 2017-05-07 MED ORDER — METOLAZONE 2.5 MG PO TABS: 2.5000 mg | ORAL_TABLET | Freq: Once | ORAL | 0 refills | Status: DC

## 2017-05-07 NOTE — Telephone Encounter (Signed)
New message  She was to give these numbers to [facility] today  Weight  -  363.2 Friday  363.4 Saturday 364 Sunday 362.4 Monday  She is breathing a little better since Friday, she is not feeling threatened in any way, but she states she is still retaining water , what would you like her to do now?  bp 116/72  Pulse 92 o2 level 96

## 2017-05-07 NOTE — Telephone Encounter (Signed)
Pt has been advised ok per Brynda Rim. PA to take Metolazone 2.5 mg tomorrow AM 30 minutes before her AM dose of Torsemide, pt advised she will need to also take an extra K+ 20 meq tomorrow only. Pt will be having lab work Friday and will have results faxed to Richardson Dopp, [place]. Pt aware to monitor her weight and call me Thursday AM with her weight. Pt advised to follow a low salt diet. I asked pt where to send Metolazone Rx, pt states she has Metolazone 2.5 mg at home already so I did not need to send in an Rx.. Pt is agreeable to plan of care and verbalized understanding to instructions with read back x 2.

## 2017-05-07 NOTE — Telephone Encounter (Signed)
Pt called in and gave her weights  New message  She was to give these numbers to Arbie Cookey today  Weight  -  363.2 Friday  363.4 Saturday 364 Sunday 362.4 Monday  She is breathing a little better since Friday, she is not feeling threatened in any way, but she states she is still retaining water , what would you like her to do now?  bp 116/72  Pulse 92 o2 level 96        Documentation     SEE WEIGHTS BELOW IN NOTE:  When I s/w pt and confirmed her weight she said her weights are 263.2 Friday, 263.4 Saturday, 264 Sunday, 262.4 Monday (today). Pt states she is feeling somewhat better. Pt did not sound sob while on the phone with me. Pt does state to me that she had 1/2 of a sub and some chicken nuggets Friday. Pt states someone told her that when she eats foods that are high in salt content that it would take a few days to get rid of the extra fluid, so she would like to know if she can take Metolazone. I advised pt I will need to d/w Richardson Dopp, [facility] for further advice. Pt thanked me for my help today and will await for recommendation per PA.

## 2017-05-07 NOTE — Telephone Encounter (Signed)
New Message  Pt call requesting to speak with Rn about changing her primary pharmacy to [facility]. Pt states she no longer uses [facility] Please call back to discuss

## 2017-05-07 NOTE — Telephone Encounter (Signed)
Corrected pt's pharmacy listed in her chart. Pt said she has not used [facility] in about 4 yrs and that she uses [facility].

## 2017-05-07 NOTE — Telephone Encounter (Signed)
Take Metolazone 2.5 mg x 1 30 minutes before Torsemide tomorrow AM. Only send in #1 tablet, no refills. Take extra K+ 20 mEq x 1 on the day she takes Metolazone. Monitor weights. Call with weights in 3 days. BMET Friday and send results to me. Low salt diet. Richardson Dopp, PA-C    05/07/2017 4:24 PM

## 2017-05-10 ENCOUNTER — Telehealth: Payer: Self-pay | Admitting: Physician Assistant

## 2017-05-10 ENCOUNTER — Encounter: Payer: Self-pay | Admitting: Physician Assistant

## 2017-05-10 ENCOUNTER — Other Ambulatory Visit: Payer: Self-pay | Admitting: Physician Assistant

## 2017-05-10 DIAGNOSIS — I5030 Unspecified diastolic (congestive) heart failure: Secondary | ICD-10-CM | POA: Diagnosis not present

## 2017-05-10 NOTE — Telephone Encounter (Signed)
Patient calling, states that she was instructed to report her weight for today. Patient's weight is 260 lbs.

## 2017-05-10 NOTE — Telephone Encounter (Signed)
Tried to reach pt to go over recommendations per [employer]. PA

## 2017-05-10 NOTE — Telephone Encounter (Signed)
With improved weight and symptoms, continue current treatment plan. Will await BMET results to be done tomorrow. If renal function stable, may be able to give one more dose of metolazone next week. Richardson Dopp, PA-C    05/10/2017 3:53 PM

## 2017-05-10 NOTE — Telephone Encounter (Signed)
Pt called in today with her weight per Richardson Dopp, PAC. I called the pt to see how she has been feeling over the last few days. Pt states SOB and Edema have improved some. Pt states she has gone from 264 lb's a few days ago to 260 lb's today. Advised pt I will Nicki Reaper W. PA know and I will call her back later with recommendations if any. Pt thanked me for my call today.

## 2017-05-11 ENCOUNTER — Telehealth: Payer: Self-pay | Admitting: Physician Assistant

## 2017-05-11 ENCOUNTER — Other Ambulatory Visit: Payer: Self-pay | Admitting: *Deleted

## 2017-05-11 DIAGNOSIS — Z79899 Other long term (current) drug therapy: Secondary | ICD-10-CM

## 2017-05-11 DIAGNOSIS — I5043 Acute on chronic combined systolic (congestive) and diastolic (congestive) heart failure: Secondary | ICD-10-CM

## 2017-05-11 DIAGNOSIS — N184 Chronic kidney disease, stage 4 (severe): Secondary | ICD-10-CM

## 2017-05-11 LAB — BASIC METABOLIC PANEL
BUN/Creatinine Ratio: 23 (ref 12–28)
BUN: 56 mg/dL — AB (ref 8–27)
CO2: 30 mmol/L — AB (ref 20–29)
CREATININE: 2.39 mg/dL — AB (ref 0.57–1.00)
Calcium: 9.6 mg/dL (ref 8.7–10.3)
Chloride: 92 mmol/L — ABNORMAL LOW (ref 96–106)
GFR calc Af Amer: 23 mL/min/{1.73_m2} — ABNORMAL LOW (ref >59)
GFR, EST NON AFRICAN AMERICAN: 20 mL/min/{1.73_m2} — AB (ref >59)
Glucose: 106 mg/dL — ABNORMAL HIGH (ref 65–99)
Potassium: 3.7 mmol/L (ref 3.5–5.2)
SODIUM: 140 mmol/L (ref 134–144)

## 2017-05-11 LAB — AMBIG ABBREV BMP8 DEFAULT

## 2017-05-11 NOTE — Telephone Encounter (Signed)
Follow up    [name] [name] called her last night, regarding swelling, patient is reporting she lost 1 more pound.

## 2017-05-11 NOTE — Telephone Encounter (Signed)
Per pt was calling with update  Pt is feeling better will forward to [facility] for review ./cy

## 2017-05-14 ENCOUNTER — Ambulatory Visit (INDEPENDENT_AMBULATORY_CARE_PROVIDER_SITE_OTHER): Payer: Medicare Other | Admitting: *Deleted

## 2017-05-14 DIAGNOSIS — I482 Chronic atrial fibrillation: Secondary | ICD-10-CM | POA: Diagnosis not present

## 2017-05-14 DIAGNOSIS — I4821 Permanent atrial fibrillation: Secondary | ICD-10-CM

## 2017-05-14 DIAGNOSIS — Z950 Presence of cardiac pacemaker: Secondary | ICD-10-CM

## 2017-05-14 LAB — CUP PACEART INCLINIC DEVICE CHECK
Battery Remaining Longevity: 90 mo
Brady Statistic RV Percent Paced: 99.75 %
Implantable Lead Implant Date: 20001110
Implantable Lead Location: 753860
Lead Channel Pacing Threshold Pulse Width: 0.4 ms
Lead Channel Sensing Intrinsic Amplitude: 12 mV
Lead Channel Setting Sensing Sensitivity: 2 mV
MDC IDC MSMT BATTERY VOLTAGE: 2.92 V
MDC IDC MSMT LEADCHNL RV IMPEDANCE VALUE: 575 Ohm
MDC IDC MSMT LEADCHNL RV PACING THRESHOLD AMPLITUDE: 0.75 V
MDC IDC PG IMPLANT DT: 20111202
MDC IDC PG SERIAL: 7171350
MDC IDC SESS DTM: 20180820123718
MDC IDC SET LEADCHNL RV PACING AMPLITUDE: 1 V
MDC IDC SET LEADCHNL RV PACING PULSEWIDTH: 0.4 ms

## 2017-05-14 NOTE — Progress Notes (Signed)
Pacemaker check in clinic. Normal device function. Thresholds, sensing, impedances consistent with previous measurements. Device programmed to maximize longevity. No high ventricular rates noted. Device programmed at appropriate safety margins. Histogram distribution appropriate for patient activity level. Device programmed to optimize intrinsic conduction. Estimated longevity 7.5-8.1 years. Patient enrolled in remote follow-up, but is unable to get Merlin monitor to work at home (poor signal). Patient education completed. ROV with GT/R in 10/2017.

## 2017-05-14 NOTE — Telephone Encounter (Signed)
Notes recorded by Richmond Campbell, LPN on 06/11/9216 at 8:37 PM EDT Pt aware of lab results and lab order faxed to [facility] in [place] ,[place] ------  Notes recorded by Richardson Dopp T, PA-C on 05/11/2017 at 1:54 PM EDT Labs 05/10/17: BUN 56, creatinine 2.39, potassium 3.7, chloride 92, CO2 30 Please call the patient Creatinine has increased some with diuresis. Would continue current dose of Torsemide. Repeat BMET 5 days. Richardson Dopp, PA-C   05/11/2017 1:54 PM

## 2017-05-14 NOTE — Patient Instructions (Signed)
Your physician wants you to follow-up in: February 2019. You will receive a reminder letter in the mail two months in advance. If you don't receive a letter, please call our office to schedule the follow-up appointment.

## 2017-05-14 NOTE — Telephone Encounter (Signed)
Notes recorded by Richmond Campbell, LPN on 5/70/1779 at 3:90 PM EDT Pt aware of lab results and lab order faxed to [facility] in [place] ,[place] ------  Notes recorded by Richardson Dopp T, PA-C on 05/11/2017 at 1:54 PM EDT Labs 05/10/17: BUN 56, creatinine 2.39, potassium 3.7, chloride 92, CO2 30 Please call the patient Creatinine has increased some with diuresis. Would continue current dose of Torsemide. Repeat BMET 5 days. Richardson Dopp, PA-C   05/11/2017 1:54 PM

## 2017-05-17 ENCOUNTER — Encounter: Payer: Self-pay | Admitting: Physician Assistant

## 2017-05-17 ENCOUNTER — Other Ambulatory Visit: Payer: Self-pay | Admitting: Physician Assistant

## 2017-05-17 DIAGNOSIS — N184 Chronic kidney disease, stage 4 (severe): Secondary | ICD-10-CM | POA: Diagnosis not present

## 2017-05-17 DIAGNOSIS — Z79899 Other long term (current) drug therapy: Secondary | ICD-10-CM | POA: Diagnosis not present

## 2017-05-18 LAB — BASIC METABOLIC PANEL
BUN/Creatinine Ratio: 24 (ref 12–28)
BUN: 52 mg/dL — ABNORMAL HIGH (ref 8–27)
CALCIUM: 9.6 mg/dL (ref 8.7–10.3)
CO2: 29 mmol/L (ref 20–29)
Chloride: 95 mmol/L — ABNORMAL LOW (ref 96–106)
Creatinine, Ser: 2.16 mg/dL — ABNORMAL HIGH (ref 0.57–1.00)
GFR, EST AFRICAN AMERICAN: 26 mL/min/{1.73_m2} — AB (ref >59)
GFR, EST NON AFRICAN AMERICAN: 22 mL/min/{1.73_m2} — AB (ref >59)
Glucose: 137 mg/dL — ABNORMAL HIGH (ref 65–99)
POTASSIUM: 4.6 mmol/L (ref 3.5–5.2)
Sodium: 139 mmol/L (ref 134–144)

## 2017-05-25 ENCOUNTER — Encounter: Payer: Self-pay | Admitting: Physician Assistant

## 2017-05-25 ENCOUNTER — Ambulatory Visit (INDEPENDENT_AMBULATORY_CARE_PROVIDER_SITE_OTHER): Payer: Medicare Other | Admitting: Physician Assistant

## 2017-05-25 VITALS — BP 118/60 | HR 96 | Ht 66.0 in | Wt 267.4 lb

## 2017-05-25 DIAGNOSIS — N183 Chronic kidney disease, stage 3 (moderate): Secondary | ICD-10-CM | POA: Diagnosis not present

## 2017-05-25 DIAGNOSIS — I4821 Permanent atrial fibrillation: Secondary | ICD-10-CM

## 2017-05-25 DIAGNOSIS — N1832 Chronic kidney disease, stage 3b: Secondary | ICD-10-CM

## 2017-05-25 DIAGNOSIS — I482 Chronic atrial fibrillation: Secondary | ICD-10-CM | POA: Diagnosis not present

## 2017-05-25 DIAGNOSIS — I5043 Acute on chronic combined systolic (congestive) and diastolic (congestive) heart failure: Secondary | ICD-10-CM

## 2017-05-25 NOTE — Patient Instructions (Signed)
Medication Instructions:  1. YOU MAY TAKE AN EXTRA TORSEMIDE 20 MG IF YOUR WEIGHT IS 261 LB OR HIGHER  2. CONTINUE ALL OTHER MEDICATIONS AS CURRENTLY   Labwork: TODAY BMET  Testing/Procedures: NONE ORDERED   Follow-Up: DR. Harrington Challenger 6-8 WEEKS   Any Other Special Instructions Will Be Listed Below (If Applicable).     If you need a refill on your cardiac medications before your next appointment, please call your pharmacy.

## 2017-05-25 NOTE — Progress Notes (Signed)
Cardiology Office Note:    Date:  05/25/2017   ID:  [name] [name], DOB 1946-03-25, MRN [id number]  PCP:  [name], MD  Cardiologist:  Dr. Dorris Carnes   Electrophysiologist: Dr. Cristopher Peru  Nephrologist: Dr. Jimmy Footman    Referring MD: [name], MD   Chief Complaint  Patient presents with  . Congestive Heart Failure    Follow-up    History of Present Illness:    [name] [name] is a 71 y.o. 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 Her CHADS2-VASc=4 (HTN, CHF, 71 yo, female). But, she has refused anticoagulation.  She was admitted in 5/18 for decompensated HF.  She was last seen in the office in 6/18 for post hospitalization follow up.   She was admitted 7/9-7/14 with a/c combined systolic and diastolic CHF with assoc small R pleural effusion.  DC weight was 253.  She saw Dr. Jimmy Footman in late July and he stopped her Hydralazine due to low BP.    Last seen 05/04/17.  She was volume overloaded.  I adjusted her Torsemide and ultimately gave her 1 dose of Metolazone.    [name] [name] returns for follow-up. She is here with her daughter. Her weight continues to fluctuate up and down. Earlier this week, she felt worse with her dyspnea. She diuresed some yesterday and feels somewhat better. However, her weight today is 263 at home. She feels better when her weight is below 261. She denies PND. She has chest discomfort that occurs when she is volume overloaded. This is unchanged. She denies syncope.  Prior CV studies:   The following studies were reviewed today:  Echo 11/22/16 Mild LVH, EF 35, ant-septal and apicalHK, mild aortic stenosis, MAC, mild MR, severe LAE, mild RAE, mild TR, PASP 40, small pericardial effusion, R pleural effusion  Renal Art [place] 08/2014 Limited visualization; no obvious RA stenosis  LHC  06/2008 Normal coronary angiography   Past Medical History:  Diagnosis Date  . Anemia   . Atrial fibrillation ([place])   . Bursitis   . Cancer ([place])   . Cataract   . Cervical cancer ([place]) 1991   s/p hysterectomy  . Chronic cellulitis   . Chronic combined systolic and diastolic congestive heart failure, NYHA class 2 (HCC)    LVEF 25-30% with restrictive diastolic filling  . Degenerative joint disease   . Diverticulitis   . Essential hypertension   . Gallstones   . GERD (gastroesophageal reflux disease)   . Gout   . Kidney stones   . Morbid obesity ([place])   . NICM (nonischemic cardiomyopathy) ([place]) 02/22/2017  . Osteoarthritis   . Permanent atrial fibrillation ([place]) 05/04/2009   Qualifier: History of  By: Quentin Cornwall CMA, Janett Billow    . 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 Sinoatrial node dysfunction [facility])    Status post PPM - Dr. Lovena Le  . Sleep apnea    CPAP    Past Surgical History:  Procedure Laterality Date  . ABDOMINAL HYSTERECTOMY  1991  . COLONOSCOPY  1998   one polyp per patient  . EYE SURGERY    . PACEMAKER INSERTION  Nov 2000   St Jude with revision in 2011  . PERCUTANEOUS NEPHROLITHOTOMY  April 2012    Current Medications: Current Meds  Medication  Sig  . allopurinol (ZYLOPRIM) 100 MG tablet Take 100 mg by mouth 2 (two) times daily.  Marland Kitchen aspirin 325 MG tablet Take 325 mg by mouth daily.    . calcitRIOL (ROCALTROL) 0.25 MCG capsule Take 0.25 mcg by mouth every other day.   . calcium carbonate (OS-CAL) 600 MG tablet Take 600 mg by mouth daily.  . COD LIVER OIL PO Take 1 tablet by mouth daily.    Marland Kitchen docusate sodium (COLACE) 100 MG capsule Take 100 mg by mouth daily as needed for mild constipation.  . ferrous sulfate 325 (65 FE) MG tablet Take 325 mg by mouth every evening.   . Glycerin, Adult, 2.1 g SUPP Place 1 suppository rectally daily as needed for moderate constipation.  Marland Kitchen  HYDROcodone-acetaminophen (NORCO) 10-325 MG per tablet Take 1 tablet by mouth 2 (two) times daily.   . isosorbide mononitrate (IMDUR) 30 MG 24 hr tablet Take 0.5 tablets (15 mg total) by mouth daily.  . metolazone (ZAROXOLYN) 2.5 MG tablet Take 2.5 mg by mouth once. Take once as instructed.  . Multiple Vitamin (MULTIVITAMIN) tablet Take 1 tablet by mouth daily.  Marland Kitchen nystatin (MYCOSTATIN/NYSTOP) powder Apply 1 application topically every other day.  . nystatin-triamcinolone ointment (MYCOLOG) Apply topically 2 (two) times daily.  Marland Kitchen omeprazole (PRILOSEC) 20 MG capsule Take 20 mg by mouth daily.    . potassium chloride SA (K-DUR,KLOR-CON) 20 MEQ tablet Take 1 tablet (20 mEq total) by mouth 2 (two) times daily.  Marland Kitchen torsemide (DEMADEX) 20 MG tablet Take 4 tablets (80 mg total) by mouth 2 (two) times daily.  . vitamin B-12 (CYANOCOBALAMIN) 500 MCG tablet Take 500 mcg by mouth daily.     Allergies:   Cephalexin; Codeine; Contrast media [iodinated diagnostic agents]; Coreg [carvedilol]; Peppermint flavor; Prednisone; Tape; Ciprofloxacin; Latex; and Penicillins   Social History   Social History  . Marital status: Widowed    Spouse name: N/A  . Number of children: N/A  . Years of education: N/A   Social History Main Topics  . Smoking status: Former Smoker    Packs/day: 1.00    Types: Cigarettes    Quit date: 09/26/1979  . Smokeless tobacco: Never Used  . Alcohol use No  . Drug use: No  . Sexual activity: No   Other Topics Concern  . None   Social History Narrative  . None     Family Hx: The patient's family history includes Colon cancer in her other; Diabetes in her sister; Heart attack in her father; Stroke in her mother. There is no history of Liver disease or Other.  ROS:   Please see the history of present illness.    ROS All other systems reviewed and are negative.   EKGs/Labs/Other Test Reviewed:    EKG:  EKG is  ordered today.  The ekg ordered today demonstrates V paced, HR  95  Recent Labs: 11/25/2016: Magnesium 2.2 02/16/2017: ALT 19; TSH 4.274 04/02/2017: B Natriuretic Peptide 160.0 04/07/2017: Hemoglobin 10.2; Platelets 131 05/17/2017: BUN 52; Creatinine, Ser 2.16; Potassium 4.6; Sodium 139   Recent Lipid Panel Lab Results  Component Value Date/Time   CHOL 142 12/14/2014 03:06 PM   TRIG 141.0 12/14/2014 03:06 PM   HDL 45.90 12/14/2014 03:06 PM   CHOLHDL 3 12/14/2014 03:06 PM   LDLCALC 68 12/14/2014 03:06 PM    Physical Exam:    VS:  BP 118/60   Pulse 96   Ht _0  (1.676 m)   Wt 267 lb 6.4 oz (  121.3 kg)   BMI 43.16 kg/m     Wt Readings from Last 3 Encounters:  05/25/17 267 lb 6.4 oz (121.3 kg)  05/04/17 263 lb 3.2 oz (119.4 kg)  04/07/17 253 lb 9.6 oz (115 kg)     Physical Exam  Constitutional: She is oriented to person, place, and time. She appears well-developed and well-nourished. No distress.  HENT:  Head: Normocephalic and atraumatic.  Eyes: No scleral icterus.  Neck: Normal range of motion. JVD: I cannot appreciate JVD.  Cardiovascular: Normal rate, regular rhythm, S1 normal and S2 normal.   Murmur heard.  Harsh systolic murmur is present with a grade of 3/6  at the upper right sternal border Pulmonary/Chest: Breath sounds normal. She has no wheezes. She has no rhonchi. She has no rales.  Abdominal: Soft. There is no tenderness.  Musculoskeletal: She exhibits edema (trace-1+ bilateral LE edema).  Neurological: She is alert and oriented to person, place, and time.  Skin: Skin is warm and dry.  Psychiatric: She has a normal mood and affect.    ASSESSMENT:    1. Acute on chronic combined systolic and diastolic congestive heart failure ([place])   2. CKD stage G3b/A1, GFR 30-44 and albumin creatinine ratio <30 mg/g   3. Permanent atrial fibrillation (HCC)    PLAN:    In order of problems listed above:  1. Acute on chronic combined systolic and diastolic congestive heart failure ([place])  She remains chronically volume overloaded.  Diuresis is limited by her renal function. She had marginal response to the metolazone. She is concerned that her creatinine increased with taking the metolazone. Overall, she feels better when her weight is less than 261 pounds.  She limits salt and fluid already.  We discussed +/- hospitalization today.  Her lungs are clear on exam.  I think we can continue adjusting diuresis at home.    -  Take extra Torsemide 20 mg if Wt > 261 lbs  -  If weight gets > 265, will need to consider admission  -  She knows to call if weight is not coming down or going higher  -  Consider Metolazone again if no response to extra Torsemide  -  BMET today  2. CKD stage G3b/A1, GFR 30-44 and albumin creatinine ratio <30 mg/g  FU with Nephrology.  Check BMET today.  3. Permanent atrial fibrillation ([place])  She has refused anticoagulation in the past.   Dispo:  Return in about 8 weeks (around 07/20/2017) for Close Follow Up, w/ Dr. Harrington Challenger.   Medication Adjustments/Labs and Tests Ordered: Current medicines are reviewed at length with the patient today.  Concerns regarding medicines are outlined above.  Tests Ordered: Orders Placed This Encounter  Procedures  . Basic Metabolic Panel (BMET)  . EKG 12-Lead   Medication Changes: No orders of the defined types were placed in this encounter.   Signed, Richardson Dopp, PA-C  05/25/2017 12:52 PM    [facility] [address], [place], [place]  [address] Phone: [phone]; Fax: [phone]

## 2017-05-26 LAB — BASIC METABOLIC PANEL
BUN / CREAT RATIO: 22 (ref 12–28)
BUN: 59 mg/dL — ABNORMAL HIGH (ref 8–27)
CO2: 29 mmol/L (ref 20–29)
Calcium: 9.8 mg/dL (ref 8.7–10.3)
Chloride: 97 mmol/L (ref 96–106)
Creatinine, Ser: 2.64 mg/dL — ABNORMAL HIGH (ref 0.57–1.00)
GFR calc Af Amer: 20 mL/min/{1.73_m2} — ABNORMAL LOW (ref >59)
GFR calc non Af Amer: 17 mL/min/{1.73_m2} — ABNORMAL LOW (ref >59)
Glucose: 79 mg/dL (ref 65–99)
POTASSIUM: 5 mmol/L (ref 3.5–5.2)
SODIUM: 142 mmol/L (ref 134–144)

## 2017-05-29 ENCOUNTER — Telehealth: Payer: Self-pay | Admitting: *Deleted

## 2017-05-29 DIAGNOSIS — I5043 Acute on chronic combined systolic (congestive) and diastolic (congestive) heart failure: Secondary | ICD-10-CM

## 2017-05-29 DIAGNOSIS — N184 Chronic kidney disease, stage 4 (severe): Secondary | ICD-10-CM

## 2017-05-29 NOTE — Telephone Encounter (Signed)
Pt has been notified of lab results and findings by phone with verbal understanding. Pt aware to continue current Tx plan. Pt agreeable to repeat bmet to be done in 1 week. Pt would like to have this done at [facility] in [place], [place]. I will fax order to [facility] [phone]. Pt thanked me for my call today.

## 2017-05-29 NOTE — Telephone Encounter (Signed)
-----   Message from Liliane Shi, [place] sent at 05/28/2017  8:08 AM EDT ----- Creatinine is higher. Continue current medications. Repeat BMET 1 week. Richardson Dopp, PA-C    05/28/2017 8:08 AM

## 2017-06-05 ENCOUNTER — Telehealth: Payer: Self-pay | Admitting: Physician Assistant

## 2017-06-05 DIAGNOSIS — I5043 Acute on chronic combined systolic (congestive) and diastolic (congestive) heart failure: Secondary | ICD-10-CM

## 2017-06-05 MED ORDER — TORSEMIDE 20 MG PO TABS: 80.0000 mg | ORAL_TABLET | Freq: Two times a day (BID) | ORAL | 11 refills | Status: DC

## 2017-06-05 NOTE — Telephone Encounter (Signed)
Pt states she needed her Torsemide to be sent to [facility], [place], [place]. I will send this in today. Pt states she did not need Rx at the time of her OV because she still had some at home. Though needs her Rx now.

## 2017-06-05 NOTE — Telephone Encounter (Signed)
Pt c/o medication issue:  1. Name of Medication: torsemide  2. How are you currently taking this medication (dosage and times per day)? 6m 2 d day  3. Are you having a reaction (difficulty breathing--STAT)? no 4. What is your medication issue? pharmacy lost new prescription

## 2017-06-06 DIAGNOSIS — N184 Chronic kidney disease, stage 4 (severe): Secondary | ICD-10-CM | POA: Diagnosis not present

## 2017-06-18 ENCOUNTER — Telehealth: Payer: Self-pay | Admitting: Internal Medicine

## 2017-06-18 NOTE — Telephone Encounter (Signed)
Spoke with pharmacist and he stated that the patient reported a dose change on the torsemide but they never received an rx. I gave them a verbal order for this as per epic, it has been sent to them via escribe twice. I authorized a quantity of #240 as neither escribe was sent in with the correct quantity.

## 2017-06-18 NOTE — Telephone Encounter (Signed)
New message   1.  torsemide (DEMADEX) 20 MG tablet Take 4 tablets (80 mg total) by mouth 2 (two) times daily.  2.[facility]  Pharmacy calling to confirm dosage frequency.  Please call.

## 2017-07-23 DIAGNOSIS — M109 Gout, unspecified: Secondary | ICD-10-CM | POA: Diagnosis not present

## 2017-07-23 DIAGNOSIS — R609 Edema, unspecified: Secondary | ICD-10-CM | POA: Diagnosis not present

## 2017-07-23 DIAGNOSIS — D631 Anemia in chronic kidney disease: Secondary | ICD-10-CM | POA: Diagnosis not present

## 2017-07-23 DIAGNOSIS — Z950 Presence of cardiac pacemaker: Secondary | ICD-10-CM | POA: Diagnosis not present

## 2017-07-23 DIAGNOSIS — N2581 Secondary hyperparathyroidism of renal origin: Secondary | ICD-10-CM | POA: Diagnosis not present

## 2017-07-23 DIAGNOSIS — I5042 Chronic combined systolic (congestive) and diastolic (congestive) heart failure: Secondary | ICD-10-CM | POA: Diagnosis not present

## 2017-07-23 DIAGNOSIS — Z87440 Personal history of urinary (tract) infections: Secondary | ICD-10-CM | POA: Diagnosis not present

## 2017-07-23 DIAGNOSIS — N200 Calculus of kidney: Secondary | ICD-10-CM | POA: Diagnosis not present

## 2017-07-23 DIAGNOSIS — G4733 Obstructive sleep apnea (adult) (pediatric): Secondary | ICD-10-CM | POA: Diagnosis not present

## 2017-07-23 DIAGNOSIS — I129 Hypertensive chronic kidney disease with stage 1 through stage 4 chronic kidney disease, or unspecified chronic kidney disease: Secondary | ICD-10-CM | POA: Diagnosis not present

## 2017-07-23 DIAGNOSIS — N184 Chronic kidney disease, stage 4 (severe): Secondary | ICD-10-CM | POA: Diagnosis not present

## 2017-07-27 DIAGNOSIS — E876 Hypokalemia: Secondary | ICD-10-CM | POA: Diagnosis not present

## 2017-07-27 DIAGNOSIS — N183 Chronic kidney disease, stage 3 (moderate): Secondary | ICD-10-CM | POA: Diagnosis not present

## 2017-07-27 DIAGNOSIS — M170 Bilateral primary osteoarthritis of knee: Secondary | ICD-10-CM | POA: Diagnosis not present

## 2017-07-27 DIAGNOSIS — M109 Gout, unspecified: Secondary | ICD-10-CM | POA: Diagnosis not present

## 2017-07-27 DIAGNOSIS — Z23 Encounter for immunization: Secondary | ICD-10-CM | POA: Diagnosis not present

## 2017-07-27 DIAGNOSIS — R609 Edema, unspecified: Secondary | ICD-10-CM | POA: Diagnosis not present

## 2017-07-27 DIAGNOSIS — E7800 Pure hypercholesterolemia, unspecified: Secondary | ICD-10-CM | POA: Diagnosis not present

## 2017-07-27 DIAGNOSIS — I10 Essential (primary) hypertension: Secondary | ICD-10-CM | POA: Diagnosis not present

## 2017-07-30 ENCOUNTER — Ambulatory Visit (INDEPENDENT_AMBULATORY_CARE_PROVIDER_SITE_OTHER): Payer: Medicare Other | Admitting: Internal Medicine

## 2017-07-30 ENCOUNTER — Encounter: Payer: Self-pay | Admitting: Internal Medicine

## 2017-07-30 ENCOUNTER — Encounter (INDEPENDENT_AMBULATORY_CARE_PROVIDER_SITE_OTHER): Payer: Self-pay

## 2017-07-30 VITALS — BP 122/58 | HR 95 | Ht 66.0 in | Wt 259.8 lb

## 2017-07-30 DIAGNOSIS — I5043 Acute on chronic combined systolic (congestive) and diastolic (congestive) heart failure: Secondary | ICD-10-CM

## 2017-07-30 NOTE — Patient Instructions (Addendum)
Your physician recommends that you continue on your current medications as directed. Please refer to the Current Medication list given to you today. Your physician wants you to follow-up in: April, 2019 with Dr. Harrington Challenger.  You will receive a reminder letter in the mail two months in advance. If you don't receive a letter, please call our office to schedule the follow-up appointment.  Addendum: labs requested from Dr. Deterding's office to be faxed to [phone].

## 2017-07-30 NOTE — Progress Notes (Signed)
Cardiology Office Note   Date:  07/30/2017   ID:  [name], [name] [birth year], MRN [id number]  PCP:  [name], MD  Cardiologist:   Dorris Carnes, MD    F/U of HTN and chronic systolic CHF  History of Present Illness: [name] [name] is a 71 y.o. female with a history of HTN, chronic systolic CHF, morbid obesity and atrial fib  Refuses anticoagulation.  She is sp PPM after HB after AVN ablation.  I saw her in June  Lasix had been increased prior  Experienced CP which may have been musculoskeltal  Myoview showed no ischmia  LVEF 48%     I saw the pt in December 2016    Pt was last seen by [name] [name] in Aug 2018  Had been admitted in July for systolic and diastiolic CHF exacerbation  D/C wt was 253   Scott adjusted torsemide and added metalozone   Current Outpatient Medications  Medication Sig Dispense Refill  . allopurinol (ZYLOPRIM) 100 MG tablet Take 100 mg by mouth 2 (two) times daily.    Marland Kitchen aspirin 325 MG tablet Take 325 mg by mouth daily.      . calcitRIOL (ROCALTROL) 0.25 MCG capsule Take 0.25 mcg by mouth every other day.     . calcium carbonate (OS-CAL) 600 MG tablet Take 600 mg by mouth daily.    . COD LIVER OIL PO Take 1 tablet by mouth daily.      Marland Kitchen docusate sodium (COLACE) 100 MG capsule Take 100 mg by mouth daily as needed for mild constipation.    . ferrous sulfate 325 (65 FE) MG tablet Take 325 mg by mouth every evening.     . Glycerin, Adult, 2.1 g SUPP Place 1 suppository rectally daily as needed for moderate constipation.  0  . HYDROcodone-acetaminophen (NORCO) 10-325 MG per tablet Take 1 tablet by mouth 2 (two) times daily.     . isosorbide mononitrate (IMDUR) 30 MG 24 hr tablet Take 0.5 tablets (15 mg total) by mouth daily. 90 tablet 3  . metolazone (ZAROXOLYN) 2.5 MG tablet Take 2.5 mg by mouth once. Take once as instructed.    . Multiple Vitamin (MULTIVITAMIN) tablet Take 1 tablet by mouth daily.    Marland Kitchen nystatin (MYCOSTATIN/NYSTOP) powder Apply 1  application topically every other day.    . nystatin-triamcinolone ointment (MYCOLOG) Apply topically 2 (two) times daily. 30 g 0  . omeprazole (PRILOSEC) 20 MG capsule Take 20 mg by mouth daily.      . potassium chloride SA (K-DUR,KLOR-CON) 20 MEQ tablet Take 1 tablet (20 mEq total) by mouth 2 (two) times daily. 180 tablet 3  . torsemide (DEMADEX) 20 MG tablet Take 4 tablets (80 mg total) by mouth 2 (two) times daily. 224 tablet 11  . vitamin B-12 (CYANOCOBALAMIN) 500 MCG tablet Take 500 mcg by mouth daily.     No current facility-administered medications for this visit.     Allergies:   Cephalexin; Codeine; Contrast media [iodinated diagnostic agents]; Coreg [carvedilol]; Peppermint flavor; Prednisone; Tape; Ciprofloxacin; Latex; and Penicillins   Past Medical History:  Diagnosis Date  . Anemia   . Atrial fibrillation ([place])   . Bursitis   . Cancer ([name] [name])   . Cataract   . Cervical cancer ([place]) 1991   s/p hysterectomy  . Chronic cellulitis   . Chronic combined systolic and diastolic congestive heart failure, NYHA class 2 (HCC)    LVEF 25-30% with restrictive diastolic filling  . Degenerative  joint disease   . Diverticulitis   . Essential hypertension   . Gallstones   . GERD (gastroesophageal reflux disease)   . Gout   . Kidney stones   . Morbid obesity ([name] [name])   . NICM (nonischemic cardiomyopathy) ([name] [name]) 02/22/2017  . Osteoarthritis   . Permanent atrial fibrillation ([name] [name]) 05/04/2009   Qualifier: History of  By: Quentin Cornwall CMA, Janett Billow    . 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 Sinoatrial node dysfunction [name])    Status post PPM - Dr. Lovena Le  . Sleep apnea    CPAP    Past Surgical History:  Procedure Laterality Date  . ABDOMINAL HYSTERECTOMY  1991  . COLONOSCOPY  1998   one polyp per patient  . EYE SURGERY    . PACEMAKER INSERTION  Nov 2000   St Jude with revision in 2011  . PERCUTANEOUS  NEPHROLITHOTOMY  April 2012     Social History:  The patient  reports that she quit smoking about 37 years ago. Her smoking use included cigarettes. She smoked 1.00 pack per day. she has never used smokeless tobacco. She reports that she does not drink alcohol or use drugs.   Family History:  The patient's family history includes Colon cancer in her other; Diabetes in her sister; Heart attack in her father; Stroke in her mother.    ROS:  Please see the history of present illness. All other systems are reviewed and  Negative to the above problem except as noted.    PHYSICAL EXAM: VS:  BP (!) 122/58   Pulse 95   Ht 5' 6"  (1.676 m)   Wt 259 lb 12.8 oz (117.8 kg) Comment: patient stated weight  SpO2 94%   BMI 41.93 kg/m   GEN: Morbidly obese 71 yo, in no acute distress Examined in chair. HEENT: normal  Neck: JVP normal No, carotid bruits, or masses Cardiac: RRR; no murmurs, rubs, or gallops,1+ edema   Chronic stasis changes   Respiratory:  clear to auscultation bilaterally, normal work of breathing GI: soft, nontender, nondistended, + BS  No hepatomegaly  MS: no deformity Moving all extremities   Neuro:  Strength and sensation are intact Psych: euthymic mood, full affect   EKG:  EKG is not ordered today.   Lipid Panel    Component Value Date/Time   CHOL 142 12/14/2014 1506   TRIG 141.0 12/14/2014 1506   HDL 45.90 12/14/2014 1506   CHOLHDL 3 12/14/2014 1506   VLDL 28.2 12/14/2014 1506   LDLCALC 68 12/14/2014 1506      Wt Readings from Last 3 Encounters:  07/30/17 259 lb 12.8 oz (117.8 kg)  05/25/17 267 lb 6.4 oz (121.3 kg)  05/04/17 263 lb 3.2 oz (119.4 kg)      ASSESSMENT AND PLAN:  1.  Chronic systolic CHF  Need to get records from renal clinic.  I would not make any changes for now.  2.  HTN BP is OK  Follow on meds    3  Chronic atrial fib  Has refused anticoagulation.    4  CKD  Will get record from renal clinic.     Signed, Dorris Carnes, MD    07/30/2017 11:46 AM    [address] [place], [name], [name]  [address] Phone: [phone]; Fax: [phone]

## 2017-08-08 ENCOUNTER — Other Ambulatory Visit: Payer: Self-pay | Admitting: *Deleted

## 2017-08-08 ENCOUNTER — Telehealth: Payer: Self-pay | Admitting: Gastroenterology

## 2017-08-08 DIAGNOSIS — K7460 Unspecified cirrhosis of liver: Secondary | ICD-10-CM

## 2017-08-08 DIAGNOSIS — K7581 Nonalcoholic steatohepatitis (NASH): Principal | ICD-10-CM

## 2017-08-08 NOTE — Telephone Encounter (Signed)
Spoke with pt. Order has been faxed to [facility] and they will contact patient to have u/s scheduled.

## 2017-08-08 NOTE — Telephone Encounter (Signed)
Patient last ov note stated she is to have ultrasound done prior to her next office visit.  She also stated she has those done in [place].  Please giver her a call

## 2017-08-10 ENCOUNTER — Encounter: Payer: Self-pay | Admitting: Gastroenterology

## 2017-08-10 DIAGNOSIS — K7460 Unspecified cirrhosis of liver: Secondary | ICD-10-CM | POA: Diagnosis not present

## 2017-08-10 DIAGNOSIS — K7581 Nonalcoholic steatohepatitis (NASH): Secondary | ICD-10-CM | POA: Diagnosis not present

## 2017-10-04 ENCOUNTER — Ambulatory Visit (INDEPENDENT_AMBULATORY_CARE_PROVIDER_SITE_OTHER): Payer: Medicare Other | Admitting: Gastroenterology

## 2017-10-04 ENCOUNTER — Encounter: Payer: Self-pay | Admitting: Gastroenterology

## 2017-10-04 DIAGNOSIS — K7581 Nonalcoholic steatohepatitis (NASH): Secondary | ICD-10-CM

## 2017-10-04 DIAGNOSIS — K7460 Unspecified cirrhosis of liver: Secondary | ICD-10-CM | POA: Diagnosis not present

## 2017-10-04 DIAGNOSIS — K219 Gastro-esophageal reflux disease without esophagitis: Secondary | ICD-10-CM | POA: Diagnosis not present

## 2017-10-04 DIAGNOSIS — R1310 Dysphagia, unspecified: Secondary | ICD-10-CM

## 2017-10-04 NOTE — Patient Instructions (Signed)
CONTINUE YOUR WEIGHT LOSS EFFORTS.  CONTINUE OMEPRAZOLE.  TAKE 30 MINUTES PRIOR TO YOUR FIRST MEAL.  FOLLOW UP IN 6 MOS.

## 2017-10-04 NOTE — Progress Notes (Signed)
ON RECALL  °

## 2017-10-04 NOTE — Assessment & Plan Note (Signed)
SYMPTOMS CONTROLLED/RESOLVED.  CONTINUE OMEPRAZOLE.  TAKE 30 MINUTES PRIOR TO YOUR FIRST MEAL. FOLLOW UP IN 6 MOS.

## 2017-10-04 NOTE — Assessment & Plan Note (Addendum)
WELL COMPENSATED DISEASE.  COMPLETE LABS: CBC/CMP/PT/INR. CONTINUE YOUR WEIGHT LOSS EFFORTS. U/S 1 WEEK PRIOR TO NEXT OPV.  FOLLOW UP IN 6 MOS.

## 2017-10-04 NOTE — Assessment & Plan Note (Signed)
SYMPTOMS CONTROLLED/RESOLVED.  CONTINUE TO MONITOR SYMPTOMS. 

## 2017-10-04 NOTE — Progress Notes (Signed)
CC'D TO PCP °

## 2017-10-04 NOTE — Progress Notes (Signed)
Subjective:    Patient ID: [name] [name], female    DOB: [birth year], 72 y.o.   MRN: [id number]  [name] Amel, MD   HPI Eating ok. Energy level: not so good. IF TRIES TO EXERT ENERGY IT'S ROUGH AND GETS SOB. WORD PUZZLE.  BMs: WHEN THEY WANT TO. CAN BE A PROBLEM.USUALLY TAKES COLACE EVERY DAY AND KEEPS IT SOFT. USES PRUNES OR LAXATIVE PRN. BEEN HAVING SOME PAIN IN LEFT. RARE OCCASIONAL GETS STRANGLED.  PT DENIES FEVER, CHILLS, HEMATOCHEZIA, HEMATEMESIS, nausea, vomiting, melena, diarrhea, CHEST PAIN, CHANGE IN BOWEL IN HABITS, problems swallowing, OR  heartburn or indigestion.  Past Medical History:  Diagnosis Date  . Anemia   . Atrial fibrillation ([place])   . Bursitis   . Cancer ([place])   . Cataract   . Cervical cancer ([place]) 1991   s/p hysterectomy  . Chronic cellulitis   . Chronic combined systolic and diastolic congestive heart failure, NYHA class 2 (HCC)    LVEF 25-30% with restrictive diastolic filling  . Degenerative joint disease   . Diverticulitis   . Essential hypertension   . Gallstones   . GERD (gastroesophageal reflux disease)   . Gout   . Kidney stones   . Morbid obesity ([place])   . NICM (nonischemic cardiomyopathy) ([place]) 02/22/2017  . Osteoarthritis   . Permanent atrial fibrillation ([place]) 05/04/2009   Qualifier: History of  By: Quentin Cornwall CMA, Janett Billow    . 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 Sinoatrial node dysfunction [facility])    Status post PPM - Dr. Lovena Le  . Sleep apnea    CPAP   Past Surgical History:  Procedure Laterality Date  . ABDOMINAL HYSTERECTOMY  1991  . COLONOSCOPY  1998   one polyp per patient  . EYE SURGERY    . PACEMAKER INSERTION  Nov 2000   St Jude with revision in 2011  . PERCUTANEOUS NEPHROLITHOTOMY  April 2012   Allergies  Allergen Reactions  . Cephalexin Shortness Of Breath    sob  . Codeine Anaphylaxis    REACTION: throat swelling  . Contrast Media  [Iodinated Diagnostic Agents] Other (See Comments)    Patient states "I have chronic kidney disease so the doctor said no dye in my veins."  . Coreg [Carvedilol] Other (See Comments)    Beta Blockers cause her organs to shut down per patient  . Peppermint Flavor Shortness Of Breath  . Prednisone Anaphylaxis, Shortness Of Breath and Swelling    REACTION: swelling, S.O.B.  . Tape Other (See Comments)    States plastic tape blisters her skin  . Ciprofloxacin Hives  . Latex Itching and Rash  . Penicillins Hives   Current Outpatient Medications  Medication Sig Dispense Refill  . allopurinol (ZYLOPRIM) 100 MG tablet Take 100 mg by mouth 2 (two) times daily.    Marland Kitchen aspirin 325 MG tablet Take 325 mg by mouth daily.      . calcitRIOL (ROCALTROL) 0.25 MCG capsule Take 0.25 mcg by mouth every other day.     . calcium carbonate (OS-CAL) 600 MG tablet Take 600 mg by mouth daily.    . COD LIVER OIL PO Take 1 tablet by mouth daily.      Marland Kitchen docusate sodium (COLACE) 100 MG capsule Take 100 mg by mouth daily as needed for mild constipation.    . ferrous sulfate 325 (65 FE) MG tablet Take 325 mg by mouth every  evening.     . Glycerin, Adult, 2.1 g SUPP Place 1 suppository rectally daily as needed for moderate constipation.    Marland Kitchen HYDROcodone-acetaminophen (NORCO) 10-325 MG per tablet Take 1 tablet by mouth 2 (two) times daily.     . isosorbide mononitrate (IMDUR) 30 MG 24 hr tablet Take 0.5 tablets (15 mg total) by mouth daily.    . metolazone (ZAROXOLYN) 2.5 MG tablet Take 2.5 mg by mouth once. Take once as instructed.    . Multiple Vitamin (MULTIVITAMIN) tablet Take 1 tablet by mouth daily.    Marland Kitchen nystatin (MYCOSTATIN/NYSTOP) powder Apply 1 application topically every other day.    . nystatin-triamcinolone ointment (MYCOLOG) Apply topically 2 (two) times daily.    Marland Kitchen omeprazole (PRILOSEC) 20 MG capsule Take 20 mg by mouth daily.      . potassium chloride SA (K-DUR,KLOR-CON) 20 MEQ tablet Take 1 tablet (20 mEq  total) by mouth 2 (two) times daily.    Marland Kitchen torsemide (DEMADEX) 20 MG tablet Take 4 tablets (80 mg total) by mouth 2 (two) times daily.    . vitamin B-12 (CYANOCOBALAMIN) 500 MCG tablet Take 500 mcg by mouth daily.     Review of Systems PER HPI OTHERWISE ALL SYSTEMS ARE NEGATIVE.    Objective:   Physical Exam  Constitutional: She is oriented to person, place, and time. She appears well-developed and well-nourished. No distress.  HENT:  Head: Normocephalic and atraumatic.  Mouth/Throat: Oropharynx is clear and moist. No oropharyngeal exudate.  Eyes: Pupils are equal, round, and reactive to light. No scleral icterus.  Neck: Normal range of motion. Neck supple.  Cardiovascular: Normal rate, regular rhythm and normal heart sounds.  Pulmonary/Chest: Effort normal and breath sounds normal. No respiratory distress.  Abdominal: Soft. Bowel sounds are normal. She exhibits no distension. There is no tenderness.  Musculoskeletal: She exhibits no edema.  Lymphadenopathy:    She has no cervical adenopathy.  Neurological: She is alert and oriented to person, place, and time.  Psychiatric: She has a normal mood and affect.  Vitals reviewed.     Assessment & Plan:

## 2017-10-10 DIAGNOSIS — N200 Calculus of kidney: Secondary | ICD-10-CM | POA: Diagnosis not present

## 2017-10-10 DIAGNOSIS — D631 Anemia in chronic kidney disease: Secondary | ICD-10-CM | POA: Diagnosis not present

## 2017-10-10 DIAGNOSIS — N184 Chronic kidney disease, stage 4 (severe): Secondary | ICD-10-CM | POA: Diagnosis not present

## 2017-10-10 DIAGNOSIS — Z950 Presence of cardiac pacemaker: Secondary | ICD-10-CM | POA: Diagnosis not present

## 2017-10-10 DIAGNOSIS — N2581 Secondary hyperparathyroidism of renal origin: Secondary | ICD-10-CM | POA: Diagnosis not present

## 2017-10-10 DIAGNOSIS — G4733 Obstructive sleep apnea (adult) (pediatric): Secondary | ICD-10-CM | POA: Diagnosis not present

## 2017-10-10 DIAGNOSIS — I5042 Chronic combined systolic (congestive) and diastolic (congestive) heart failure: Secondary | ICD-10-CM | POA: Diagnosis not present

## 2017-10-10 DIAGNOSIS — I480 Paroxysmal atrial fibrillation: Secondary | ICD-10-CM | POA: Diagnosis not present

## 2017-10-10 DIAGNOSIS — Z87440 Personal history of urinary (tract) infections: Secondary | ICD-10-CM | POA: Diagnosis not present

## 2017-10-10 DIAGNOSIS — M109 Gout, unspecified: Secondary | ICD-10-CM | POA: Diagnosis not present

## 2017-10-10 DIAGNOSIS — I129 Hypertensive chronic kidney disease with stage 1 through stage 4 chronic kidney disease, or unspecified chronic kidney disease: Secondary | ICD-10-CM | POA: Diagnosis not present

## 2017-10-11 ENCOUNTER — Encounter: Payer: Self-pay | Admitting: Nephrology

## 2017-11-02 ENCOUNTER — Telehealth: Payer: Self-pay | Admitting: Pulmonary Disease

## 2017-11-02 ENCOUNTER — Encounter: Payer: Self-pay | Admitting: Internal Medicine

## 2017-11-02 NOTE — Telephone Encounter (Signed)
Spoke with pt, she states she wanted to make sure we could download her card off her bipap when she comes in for a follow up.   Spoke with Lovey Newcomer at [facility], she is unable to see what model pt has and will have the respiratory therapist call me back to see what other options we have to obtain download. Will keep encounter open until she calls back.

## 2017-11-19 ENCOUNTER — Encounter: Payer: Medicare Other | Admitting: Internal Medicine

## 2017-11-21 ENCOUNTER — Other Ambulatory Visit: Payer: Self-pay

## 2017-11-21 ENCOUNTER — Ambulatory Visit (INDEPENDENT_AMBULATORY_CARE_PROVIDER_SITE_OTHER): Payer: Medicare Other | Admitting: Internal Medicine

## 2017-11-21 ENCOUNTER — Encounter: Payer: Self-pay | Admitting: Internal Medicine

## 2017-11-21 VITALS — BP 130/78 | HR 95 | Ht 66.0 in | Wt 261.8 lb

## 2017-11-21 DIAGNOSIS — I5043 Acute on chronic combined systolic (congestive) and diastolic (congestive) heart failure: Secondary | ICD-10-CM

## 2017-11-21 DIAGNOSIS — I4821 Permanent atrial fibrillation: Secondary | ICD-10-CM

## 2017-11-21 DIAGNOSIS — I495 Sick sinus syndrome: Secondary | ICD-10-CM

## 2017-11-21 DIAGNOSIS — I482 Chronic atrial fibrillation: Secondary | ICD-10-CM

## 2017-11-21 NOTE — Patient Instructions (Signed)
Medication Instructions:  Your physician recommends that you continue on your current medications as directed. Please refer to the Current Medication list given to you today.   Labwork: NONE   Testing/Procedures: NONE   Follow-Up: Your physician wants you to follow-up in: 6 Months with Dr. Lovena Le. You will receive a reminder letter in the mail two months in advance. If you don't receive a letter, please call our office to schedule the follow-up appointment.  Remote monitoring is used to monitor your Pacemaker of ICD from home. This monitoring reduces the number of office visits required to check your device to one time per year. It allows [place] to keep an eye on the functioning of your device to ensure it is working properly. You are scheduled for a device check from home in May. You may send your transmission at any time that day. If you have a wireless device, the transmission will be sent automatically. After your physician reviews your transmission, you will receive a postcard with your next transmission date.   Any Other Special Instructions Will Be Listed Below (If Applicable).     If you need a refill on your cardiac medications before your next appointment, please call your pharmacy. Thank you for choosing [facility]!

## 2017-11-21 NOTE — Progress Notes (Signed)
HPI [name] [name] returns today for followup. 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 She has hypertension, and has refused to take any anticoagulation other than aspirin for the past 13 years. She continues to have chronic peripheral edema and venous insufficiency. 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 She has class IIl systolic/diastolic heart failure symptoms. She has developed worsening renal insufficiency with a serum creatinine over 2. She has lost over 100 lbs. She has been hospitalized 2 times in the past year. Allergies  Allergen Reactions  . Cephalexin Shortness Of Breath    sob  . Codeine Anaphylaxis    REACTION: throat swelling  . Contrast Media [Iodinated Diagnostic Agents] Other (See Comments)    Patient states "I have chronic kidney disease so the doctor said no dye in my veins."  . Coreg [Carvedilol] Other (See Comments)    Beta Blockers cause her organs to shut down per patient  . Peppermint Flavor Shortness Of Breath  . Prednisone Anaphylaxis, Shortness Of Breath and Swelling    REACTION: swelling, S.O.B.  . Tape Other (See Comments)    States plastic tape blisters her skin  . Ciprofloxacin Hives  . Latex Itching and Rash  . Penicillins Hives     Current Outpatient Medications  Medication Sig Dispense Refill  . allopurinol (ZYLOPRIM) 100 MG tablet Take 100 mg by mouth 2 (two) times daily.    Marland Kitchen aspirin 325 MG tablet Take 325 mg by mouth daily.      . calcitRIOL (ROCALTROL) 0.25 MCG capsule Take 0.25 mcg by mouth every other day.     . calcium carbonate (OS-CAL) 600 MG tablet Take 600 mg by mouth daily.    . COD LIVER OIL PO Take 1 tablet by mouth daily.      Marland Kitchen docusate sodium (COLACE) 100 MG capsule Take 100 mg by mouth daily as needed for mild constipation.    .  feeding supplement (BOOST HIGH PROTEIN) LIQD Take 1 Container by mouth daily.    . ferrous sulfate 325 (65 FE) MG tablet Take 325 mg by mouth every evening.     . Glycerin, Adult, 2.1 g SUPP Place 1 suppository rectally daily as needed for moderate constipation.  0  . HYDROcodone-acetaminophen (NORCO) 10-325 MG per tablet Take 1 tablet by mouth 2 (two) times daily.     . isosorbide mononitrate (IMDUR) 30 MG 24 hr tablet Take 0.5 tablets (15 mg total) by mouth daily. 90 tablet 3  . metolazone (ZAROXOLYN) 2.5 MG tablet Take 2.5 mg by mouth once. Take once as instructed.    . Multiple Vitamin (MULTIVITAMIN) tablet Take 1 tablet by mouth daily.    Marland Kitchen nystatin (MYCOSTATIN/NYSTOP) powder Apply 1 application topically every other day.    . nystatin-triamcinolone ointment (MYCOLOG) Apply topically 2 (two) times daily. 30 g 0  . omeprazole (PRILOSEC) 20 MG capsule Take 20 mg by mouth daily.      Marland Kitchen torsemide (DEMADEX) 20 MG tablet Take 4 tablets (80 mg total) by mouth 2 (two) times daily. 224 tablet 11  . vitamin B-12 (CYANOCOBALAMIN) 500 MCG tablet Take 500 mcg by mouth daily.     No current facility-administered medications for this visit.      Past Medical History:  Diagnosis Date  . Anemia   . Atrial fibrillation ([place])   .  Bursitis   . Cancer ([place])   . Cataract   . Cervical cancer ([place]) 1991   s/p hysterectomy  . Chronic cellulitis   . Chronic combined systolic and diastolic congestive heart failure, NYHA class 2 (HCC)    LVEF 25-30% with restrictive diastolic filling  . Degenerative joint disease   . Diverticulitis   . Essential hypertension   . Gallstones   . GERD (gastroesophageal reflux disease)   . Gout   . Kidney stones   . Morbid obesity ([place])   . NICM (nonischemic cardiomyopathy) ([place]) 02/22/2017  . Osteoarthritis   . Permanent atrial fibrillation ([place]) 05/04/2009   Qualifier: History of  By: Quentin Cornwall CMA, Janett Billow    . 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   Qualifier:  Diagnosis of  By: Lovena Le, MD, [facility], Binnie Kand   . Sinoatrial node dysfunction [facility])    Status post PPM - Dr. Lovena Le  . Sleep apnea    CPAP    ROS:   All systems reviewed and negative except as noted in the HPI.   Past Surgical History:  Procedure Laterality Date  . ABDOMINAL HYSTERECTOMY  1991  . COLONOSCOPY  1998   one polyp per patient  . EYE SURGERY    . PACEMAKER INSERTION  Nov 2000   St Jude with revision in 2011  . PERCUTANEOUS NEPHROLITHOTOMY  April 2012     Family History  Problem Relation Age of Onset  . Heart attack Father   . Stroke Mother   . Diabetes Sister   . Colon cancer Other        seven family members  . Liver disease Neg Hx   . Other Neg Hx      Social History   Socioeconomic History  . Marital status: Widowed    Spouse name: Not on file  . Number of children: Not on file  . Years of education: Not on file  . Highest education level: Not on file  Social Needs  . Financial resource strain: Not on file  . Food insecurity - worry: Not on file  . Food insecurity - inability: Not on file  . Transportation needs - medical: Not on file  . Transportation needs - non-medical: Not on file  Occupational History  . Not on file  Tobacco Use  . Smoking status: Former Smoker    Packs/day: 1.00    Types: Cigarettes    Last attempt to quit: 09/26/1979    Years since quitting: 38.1  . Smokeless tobacco: Never Used  Substance and Sexual Activity  . Alcohol use: No  . Drug use: No  . Sexual activity: No  Other Topics Concern  . Not on file  Social History Narrative  . Not on file     BP 130/78   Pulse 95   Ht 5' 6"  (1.676 m)   Wt 261 lb 12.8 oz (118.8 kg)   SpO2 94% Comment: on room air  BMI 42.26 kg/m   Physical Exam:  Well appearing 72 yo woman, NAD HEENT: Unremarkable Neck:  7 cm JVD, no thyromegally Lymphatics:  No adenopathy Back:  No CVA tenderness Lungs:  Clear with minimal basilar rales. HEART:  Regular rate rhythm, no  murmurs, no rubs, no clicks Abd:  soft, positive bowel sounds, no organomegally, no rebound, no guarding Ext:  2 plus pulses, 2+ edema, no cyanosis, no clubbing Skin:  No rashes no nodules Neuro:  CN II through XII intact, motor grossly intact  DEVICE  Normal device function.  See PaceArt for details.   Assess/Plan: 1. Atrial fib - her rate is well controlled after AV node ablation. She continues to refuse systemic anti-coagulation. 2. Chronic systolic/diastolic heart failure - she is very sedentary but has done much better with regard to salt and fluid intake. 3. PPM - her St. Jude device is programmed VVI. We will recheck in several months. 4. Chronic renal insufficiency - she has maintained a low sodium diet and her creatinine has been in the mid 2's. She is not volume overloaded today.  Mikle Bosworth.D.

## 2017-12-03 LAB — CUP PACEART INCLINIC DEVICE CHECK
Implantable Lead Implant Date: 20001110
Implantable Pulse Generator Implant Date: 20111202
MDC IDC LEAD LOCATION: 753860
MDC IDC SESS DTM: 20190311163049
Pulse Gen Serial Number: 7171350

## 2017-12-10 ENCOUNTER — Other Ambulatory Visit: Payer: Self-pay | Admitting: Internal Medicine

## 2017-12-10 DIAGNOSIS — I5043 Acute on chronic combined systolic (congestive) and diastolic (congestive) heart failure: Secondary | ICD-10-CM

## 2017-12-10 MED ORDER — ISOSORBIDE MONONITRATE ER 30 MG PO TB24: 15.0000 mg | ORAL_TABLET | Freq: Every day | ORAL | 2 refills | Status: DC

## 2018-01-09 DIAGNOSIS — Z87440 Personal history of urinary (tract) infections: Secondary | ICD-10-CM | POA: Diagnosis not present

## 2018-01-09 DIAGNOSIS — G4733 Obstructive sleep apnea (adult) (pediatric): Secondary | ICD-10-CM | POA: Diagnosis not present

## 2018-01-09 DIAGNOSIS — N390 Urinary tract infection, site not specified: Secondary | ICD-10-CM | POA: Diagnosis not present

## 2018-01-09 DIAGNOSIS — N200 Calculus of kidney: Secondary | ICD-10-CM | POA: Diagnosis not present

## 2018-01-09 DIAGNOSIS — I480 Paroxysmal atrial fibrillation: Secondary | ICD-10-CM | POA: Diagnosis not present

## 2018-01-09 DIAGNOSIS — R609 Edema, unspecified: Secondary | ICD-10-CM | POA: Diagnosis not present

## 2018-01-09 DIAGNOSIS — N2581 Secondary hyperparathyroidism of renal origin: Secondary | ICD-10-CM | POA: Diagnosis not present

## 2018-01-09 DIAGNOSIS — M109 Gout, unspecified: Secondary | ICD-10-CM | POA: Diagnosis not present

## 2018-01-09 DIAGNOSIS — I129 Hypertensive chronic kidney disease with stage 1 through stage 4 chronic kidney disease, or unspecified chronic kidney disease: Secondary | ICD-10-CM | POA: Diagnosis not present

## 2018-01-09 DIAGNOSIS — N184 Chronic kidney disease, stage 4 (severe): Secondary | ICD-10-CM | POA: Diagnosis not present

## 2018-01-09 DIAGNOSIS — I5042 Chronic combined systolic (congestive) and diastolic (congestive) heart failure: Secondary | ICD-10-CM | POA: Diagnosis not present

## 2018-01-09 DIAGNOSIS — D631 Anemia in chronic kidney disease: Secondary | ICD-10-CM | POA: Diagnosis not present

## 2018-01-21 ENCOUNTER — Ambulatory Visit (INDEPENDENT_AMBULATORY_CARE_PROVIDER_SITE_OTHER): Payer: Medicare Other | Admitting: Internal Medicine

## 2018-01-21 ENCOUNTER — Encounter: Payer: Self-pay | Admitting: Internal Medicine

## 2018-01-21 DIAGNOSIS — I482 Chronic atrial fibrillation: Secondary | ICD-10-CM

## 2018-01-21 DIAGNOSIS — Z950 Presence of cardiac pacemaker: Secondary | ICD-10-CM | POA: Diagnosis not present

## 2018-01-21 DIAGNOSIS — I5043 Acute on chronic combined systolic (congestive) and diastolic (congestive) heart failure: Secondary | ICD-10-CM

## 2018-01-21 DIAGNOSIS — N184 Chronic kidney disease, stage 4 (severe): Secondary | ICD-10-CM | POA: Diagnosis not present

## 2018-01-21 DIAGNOSIS — I4821 Permanent atrial fibrillation: Secondary | ICD-10-CM

## 2018-01-21 NOTE — Patient Instructions (Signed)
Your physician recommends that you continue on your current medications as directed. Please refer to the Current Medication list given to you today. Your physician wants you to follow-up in: 6 months with Dr. Ross.  You will receive a reminder letter in the mail two months in advance. If you don't receive a letter, please call our office to schedule the follow-up appointment.  

## 2018-01-21 NOTE — Progress Notes (Signed)
Cardiology Office Note   Date:  01/21/2018   ID:  [name] [name], [name] [name] 04-Feb-1946, MRN [id number]  PCP:  [name], MD  Cardiologist:   Dorris Carnes, MD    F/U of HTN and chronic systolic CHF  History of Present Illness: [name] [name] is a 72 y.o. female with a history of HTN, chronic systolic CHF, morbid obesity and atrial fib  Refuses anticoagulation.  She is sp PPM after HB after AVN ablation.    Myoview showed no ischmia  LVEF 48%  (2016)  saw the pt in November 2018  She was seen by EP in Feb 73  Pt says her breathing is stable   She does not do much   She denies CP   No signif dizzienss  Edema is rel stable   She does not elevate legs like she should    Current Outpatient Medications  Medication Sig Dispense Refill  . allopurinol (ZYLOPRIM) 100 MG tablet Take 100 mg by mouth 2 (two) times daily.    Marland Kitchen aspirin 325 MG tablet Take 325 mg by mouth daily.      . calcitRIOL (ROCALTROL) 0.25 MCG capsule Take 0.25 mcg by mouth every other day.     . calcium carbonate (OS-CAL) 600 MG tablet Take 600 mg by mouth daily.    . COD LIVER OIL PO Take 1 tablet by mouth daily.      Marland Kitchen docusate sodium (COLACE) 100 MG capsule Take 100 mg by mouth daily as needed for mild constipation.    . feeding supplement (BOOST HIGH PROTEIN) LIQD Take 1 Container by mouth daily.    . ferrous sulfate 325 (65 FE) MG tablet Take 325 mg by mouth every evening.     . Glycerin, Adult, 2.1 g SUPP Place 1 suppository rectally daily as needed for moderate constipation.  0  . HYDROcodone-acetaminophen (NORCO) 10-325 MG per tablet Take 1 tablet by mouth 2 (two) times daily.     . isosorbide mononitrate (IMDUR) 30 MG 24 hr tablet Take 0.5 tablets (15 mg total) by mouth daily. 90 tablet 2  . metolazone (ZAROXOLYN) 2.5 MG tablet Take 2.5 mg by mouth once. Take once as instructed.    . Multiple Vitamin (MULTIVITAMIN) tablet Take 1 tablet by mouth daily.    Marland Kitchen nystatin (MYCOSTATIN/NYSTOP) powder Apply 1  application topically every other day.    . nystatin-triamcinolone ointment (MYCOLOG) Apply topically 2 (two) times daily. 30 g 0  . torsemide (DEMADEX) 20 MG tablet Take 4 tablets (80 mg total) by mouth 2 (two) times daily. 224 tablet 11  . vitamin B-12 (CYANOCOBALAMIN) 500 MCG tablet Take 500 mcg by mouth daily.     No current facility-administered medications for this visit.     Allergies:   Cephalexin; Codeine; Contrast media [iodinated diagnostic agents]; Coreg [carvedilol]; Peppermint flavor; Prednisone; Tape; Ciprofloxacin; Latex; and Penicillins   Past Medical History:  Diagnosis Date  . Anemia   . Atrial fibrillation ([place])   . Bursitis   . Cancer ([place])   . Cataract   . Cervical cancer ([place]) 1991   s/p hysterectomy  . Chronic cellulitis   . Chronic combined systolic and diastolic congestive heart failure, NYHA class 2 (HCC)    LVEF 25-30% with restrictive diastolic filling  . Degenerative joint disease   . Diverticulitis   . Essential hypertension   . Gallstones   . GERD (gastroesophageal reflux disease)   . Gout   . Kidney stones   .  Morbid obesity ([place])   . NICM (nonischemic cardiomyopathy) ([place]) 02/22/2017  . Osteoarthritis   . Permanent atrial fibrillation ([place]) 05/04/2009   Qualifier: History of  By: Quentin Cornwall CMA, Janett Billow    . 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   Qualifier: Diagnosis of  By: Lovena Le, MD, [facility], Binnie Kand   . 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 Lovena Le  . Sleep apnea    CPAP    Past Surgical History:  Procedure Laterality Date  . ABDOMINAL HYSTERECTOMY  1991  . COLONOSCOPY  1998   one polyp per patient  . EYE SURGERY    . PACEMAKER INSERTION  Nov 2000   St Jude with revision in 2011  . PERCUTANEOUS NEPHROLITHOTOMY  April 2012     Social History:  The patient  reports that she quit smoking about 38 years ago. Her smoking use included cigarettes. She smoked 1.00 pack per day. She has never used smokeless  tobacco. She reports that she does not drink alcohol or use drugs.   Family History:  The patient's family history includes Colon cancer in her other; Diabetes in her sister; Heart attack in her father; Stroke in her mother.    ROS:  Please see the history of present illness. All other systems are reviewed and  Negative to the above problem except as noted.    PHYSICAL EXAM: VS:  BP 118/60   Pulse 93   Ht 5' 6"  (1.676 m)   Wt 258 lb (117 kg)   SpO2 93%   BMI 41.64 kg/m   GEN: Morbidly obese 72 yo, in no acute distress Examined in chair. HEENT: normal  Neck: JVP normal No, carotid bruits, or masses Cardiac: RRR; no murmurs, rubs, or gallops LE:   1+ LE edema     Chronic stasis changes   Respiratory:  clear to auscultation bilaterally, normal work of breathing GI: soft, nontender, nondistended, + BS  No hepatomegaly  MS: no deformity Moving all extremities   Neuro:  Strength and sensation are intact Psych: euthymic mood, full affect   EKG:  EKG is not ordered today.   Lipid Panel    Component Value Date/Time   CHOL 142 12/14/2014 1506   TRIG 141.0 12/14/2014 1506   HDL 45.90 12/14/2014 1506   CHOLHDL 3 12/14/2014 1506   VLDL 28.2 12/14/2014 1506   LDLCALC 68 12/14/2014 1506      Wt Readings from Last 3 Encounters:  01/21/18 258 lb (117 kg)  11/21/17 261 lb 12.8 oz (118.8 kg)  10/04/17 258 lb (117 kg)      ASSESSMENT AND PLAN:  1.  Chronic systolic CHF  LVEF 61% in Feb 2018 echo   Volume is fair   Need to get records from renal clinic  She is only on nitrate and diuretic   Meds adjusted in ranal ciinic   Will review   Keep on same meds   2.  HTN BP is good   Continue meds    3  Chronic atrial fib   Has refused anticoagulation.    4  CHB   After AV node abllation   Has PPM    CKD  Will get records from renal clinic re labs and med changes    F/U this falll in clinic  Signed, Dorris Carnes, MD  01/21/2018 10:24 AM    [address], [place], [place]  [address] Phone: [phone]; Fax: [phone]     

## 2018-01-24 DIAGNOSIS — I10 Essential (primary) hypertension: Secondary | ICD-10-CM | POA: Diagnosis not present

## 2018-01-24 DIAGNOSIS — K7460 Unspecified cirrhosis of liver: Secondary | ICD-10-CM | POA: Diagnosis not present

## 2018-01-24 DIAGNOSIS — I509 Heart failure, unspecified: Secondary | ICD-10-CM | POA: Diagnosis not present

## 2018-01-24 DIAGNOSIS — E7800 Pure hypercholesterolemia, unspecified: Secondary | ICD-10-CM | POA: Diagnosis not present

## 2018-01-24 DIAGNOSIS — M170 Bilateral primary osteoarthritis of knee: Secondary | ICD-10-CM | POA: Diagnosis not present

## 2018-01-30 ENCOUNTER — Ambulatory Visit (INDEPENDENT_AMBULATORY_CARE_PROVIDER_SITE_OTHER)
Admission: RE | Admit: 2018-01-30 | Discharge: 2018-01-30 | Disposition: A | Discharge status: Home / Self Care | Payer: Medicare Other | Source: Ambulatory Visit | Attending: Adult Health | Admitting: Adult Health

## 2018-01-30 ENCOUNTER — Encounter: Payer: Self-pay | Admitting: Adult Health

## 2018-01-30 ENCOUNTER — Ambulatory Visit (INDEPENDENT_AMBULATORY_CARE_PROVIDER_SITE_OTHER): Payer: Medicare Other | Admitting: Adult Health

## 2018-01-30 ENCOUNTER — Other Ambulatory Visit: Payer: Self-pay | Admitting: Adult Health

## 2018-01-30 VITALS — BP 108/62 | HR 96 | Ht 66.0 in | Wt 258.2 lb

## 2018-01-30 DIAGNOSIS — J90 Pleural effusion, not elsewhere classified: Secondary | ICD-10-CM

## 2018-01-30 DIAGNOSIS — G4733 Obstructive sleep apnea (adult) (pediatric): Secondary | ICD-10-CM

## 2018-01-30 NOTE — Assessment & Plan Note (Signed)
follow up CXR today

## 2018-01-30 NOTE — Assessment & Plan Note (Signed)
Well controlled on BIPAP  Download requested   Plan  Patient Instructions  Chest xray today .  Continue on BiPAP at bedtime Order for new BiPAP to your home care company. Work on healthy weight Follow-up with Dr. Elsworth Soho in 6 months and as needed

## 2018-01-30 NOTE — Patient Instructions (Signed)
Chest xray today .  Continue on BiPAP at bedtime Order for new BiPAP to your home care company. Work on healthy weight Follow-up with Dr. Elsworth Soho in 6 months and as needed

## 2018-01-30 NOTE — Progress Notes (Signed)
@Patient  ID: [name] [name], female    DOB: Nov 27, 1945, 72 y.o.   MRN: [id number]  Chief Complaint  Patient presents with  . Follow-up    OSA     Referring provider: [name], MD  HPI: 72 year old female morbidly obese former smoker followed for obstructive sleep apnea and pleural effusion She has been maintained on BiPAP since 1994 Past medical history significant for congestive heart failure   01/30/2018 Follow up: OSA and Pleural Effusion  Patient presents for a one-year follow-up.  Patient has underlying obstructive sleep apnea is on BiPAP at night.  Patient says she wears her BiPAP every night never misses any.  She says she been having some trouble with her full facemask.  It keeps lying up at night.Hollace Kinnier was requested.  She says her BiPAP machine is getting old.  And needs a new one.  She had it for many years.  She says she greatly benefits from her BiPAP with decreased daytime sleepiness.  And cannot live without it  Patient was hospitalized in March 2018 with congestive heart failure E. coli UTI and pleural effusion.  Follow-up chest x-ray showed a stable pleural effusion.  Patient did not return for a follow-up chest x-ray.  We discussed repeating that today.  She denies any increased cough shortness of breath or increased edema.   Significant tests/ events Baseline PSG 05/2010 showed AHI 30/h, RDI 86/h &desaturation to 87%.  02/2003 >>wt 360 lbs >>BiPAP was re-titrated to 19/12 to abolish snoring although 15/11 was adequate for events.  CPAP titration 02/17/09 ([facility], Dr Radford Pax) to 13 cm >> did not tolerate CPAP.   RPt titration 03/2013 showed corrected by BiPAP of 19/13 with a small fullface mask.   Hospitalised 11/2010 Urosepsis with calculi, required PCN, required mech vent x 6ds,    Allergies  Allergen Reactions  . Cephalexin Shortness Of Breath    sob  . Codeine Anaphylaxis    REACTION: throat swelling  . Contrast Media [Iodinated  Diagnostic Agents] Other (See Comments)    Patient states "I have chronic kidney disease so the doctor said no dye in my veins."  . Coreg [Carvedilol] Other (See Comments)    Beta Blockers cause her organs to shut down per patient  . Peppermint Flavor Shortness Of Breath  . Prednisone Anaphylaxis, Shortness Of Breath and Swelling    REACTION: swelling, S.O.B.  . Tape Other (See Comments)    States plastic tape blisters her skin  . Ciprofloxacin Hives  . Latex Itching and Rash  . Penicillins Hives    Immunization History  Administered Date(s) Administered  . Influenza Split 06/25/2013  . Influenza Whole 08/06/2017  . Pneumococcal Polysaccharide-23 03/04/2010  . Tdap 03/06/2011  . Zoster 03/06/2011    Past Medical History:  Diagnosis Date  . Anemia   . Atrial fibrillation ([place])   . Bursitis   . Cancer ([place])   . Cataract   . Cervical cancer ([place]) 1991   s/p hysterectomy  . Chronic cellulitis   . Chronic combined systolic and diastolic congestive heart failure, NYHA class 2 (HCC)    LVEF 25-30% with restrictive diastolic filling  . Degenerative joint disease   . Diverticulitis   . Essential hypertension   . Gallstones   . GERD (gastroesophageal reflux disease)   . Gout   . Kidney stones   . Morbid obesity ([place])   . NICM (nonischemic cardiomyopathy) ([place]) 02/22/2017  . Osteoarthritis   . Permanent atrial fibrillation (  [place]) 05/04/2009   Qualifier: History of  By: Quentin Cornwall CMA, Janett Billow    . 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   Qualifier: Diagnosis of  By: Lovena Le, MD, [facility], Binnie Kand   . 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 Lovena Le  . Sleep apnea    CPAP    Tobacco History: Social History   Tobacco Use  Smoking Status Former Smoker  . Packs/day: 1.00  . Types: Cigarettes  . Last attempt to quit: 09/26/1979  . Years since quitting: 38.3  Smokeless Tobacco Never Used   Counseling given: Not Answered   Outpatient Encounter  Medications as of 01/30/2018  Medication Sig  . allopurinol (ZYLOPRIM) 100 MG tablet Take 100 mg by mouth 2 (two) times daily.  Marland Kitchen aspirin 325 MG tablet Take 325 mg by mouth daily.    . calcitRIOL (ROCALTROL) 0.25 MCG capsule Take 0.25 mcg by mouth every other day.   . calcium carbonate (OS-CAL) 600 MG tablet Take 600 mg by mouth daily.  . COD LIVER OIL PO Take 1 tablet by mouth daily.    Marland Kitchen docusate sodium (COLACE) 100 MG capsule Take 100 mg by mouth daily as needed for mild constipation.  . feeding supplement (BOOST HIGH PROTEIN) LIQD Take 1 Container by mouth daily.  . ferrous sulfate 325 (65 FE) MG tablet Take 325 mg by mouth every evening.   . Glycerin, Adult, 2.1 g SUPP Place 1 suppository rectally daily as needed for moderate constipation.  Marland Kitchen HYDROcodone-acetaminophen (NORCO) 10-325 MG per tablet Take 1 tablet by mouth 2 (two) times daily.   . isosorbide mononitrate (IMDUR) 30 MG 24 hr tablet Take 0.5 tablets (15 mg total) by mouth daily.  . metolazone (ZAROXOLYN) 2.5 MG tablet Take 2.5 mg by mouth once. Take once as instructed.  . Multiple Vitamin (MULTIVITAMIN) tablet Take 1 tablet by mouth daily.  Marland Kitchen nystatin (MYCOSTATIN/NYSTOP) powder Apply 1 application topically every other day.  . nystatin-triamcinolone ointment (MYCOLOG) Apply topically 2 (two) times daily.  Marland Kitchen torsemide (DEMADEX) 20 MG tablet Take 4 tablets (80 mg total) by mouth 2 (two) times daily.  . vitamin B-12 (CYANOCOBALAMIN) 500 MCG tablet Take 500 mcg by mouth daily.   No facility-administered encounter medications on file as of 01/30/2018.      Review of Systems  Constitutional:   No  weight loss, night sweats,  Fevers, chills, +fatigue, or  lassitude.  HEENT:   No headaches,  Difficulty swallowing,  Tooth/dental problems, or  Sore throat,                No sneezing, itching, ear ache, + nasal congestion, post nasal drip,   CV:  No chest pain,  Orthopnea, PND, swelling in lower extremities, anasarca, dizziness,  palpitations, syncope.   GI  No heartburn, indigestion, abdominal pain, nausea, vomiting, diarrhea, change in bowel habits, loss of appetite, bloody stools.   Resp:  .  No chest wall deformity  Skin: no rash or lesions.  GU: no dysuria, change in color of urine, no urgency or frequency.  No flank pain, no hematuria   MS:  No joint pain or swelling.  No decreased range of motion.  No back pain.    Physical Exam  BP 108/62 (BP Location: Left Arm, Cuff Size: Normal)   Pulse 96   Ht 5' 6"  (1.676 m)   Wt 258 lb 3.2 oz (117.1 kg)   SpO2 95%   BMI 41.67 kg/m   GEN:  A/Ox3; pleasant , NAD, in wc    HEENT:  /AT,  EACs-clear, TMs-wnl, NOSE-clear, THROAT-clear, no lesions, no postnasal drip or exudate noted.   NECK:  Supple w/ fair ROM; no JVD; normal carotid impulses w/o bruits; no thyromegaly or nodules palpated; no lymphadenopathy.    RESP  Clear  P & A; w/o, wheezes/ rales/ or rhonchi. no accessory muscle use, no dullness to percussion  CARD:  RRR, no m/r/g, tr  peripheral edema, pulses intact, no cyanosis or clubbing.  GI:   Soft & nt; nml bowel sounds; no organomegaly or masses detected.   Musco: Warm bil, no deformities or joint swelling noted.   Neuro: alert, no focal deficits noted.    Skin: Warm, no lesions or rashes    Lab Results:   BMET   BNP  ProBNP  Imaging: No results found.   Assessment & Plan:   OSA (obstructive sleep apnea) Well controlled on BIPAP  Download requested   Plan  Patient Instructions  Chest xray today .  Continue on BiPAP at bedtime Order for new BiPAP to your home care company. Work on healthy weight Follow-up with Dr. Elsworth Soho in 6 months and as needed     Pleural effusion on right follow up CXR today      Rexene Edison, NP 01/30/2018

## 2018-02-01 NOTE — Progress Notes (Signed)
Reviewed & agree with plan  

## 2018-02-22 ENCOUNTER — Telehealth: Payer: Self-pay | Admitting: Adult Health

## 2018-02-22 NOTE — Telephone Encounter (Signed)
Pt seen 5.8.19 by TP and new BiPAP machine ordered Download not available at time of ov, order was placed to DME  12/18/17 - 01/16/18 BiPAP download received and reviewed by TP: excellent usage and control.  No changes in therapy.  Called spoke with patient, discussed download results as stated by TP Pt stated that [employer] has yet to deliver her new machine and that an order from TP is delaying the process Advised pt an order was sent at the office visit and there is no documentation from [place] in our system Advised pt will call [facility] in [employer] to check on the status for her and call her back  [facility] in [place] and spoke with Gerri who was able to locate the order and office notes (11 pages) and reported that it did not look like anything further was needed.  She will try to get this process initiated today  Called spoke with patient, informed her of the above.  Pt okay with this and voiced her understanding. Nothing further needed; will sign off

## 2018-02-28 ENCOUNTER — Telehealth: Payer: Self-pay | Admitting: *Deleted

## 2018-02-28 DIAGNOSIS — K7581 Nonalcoholic steatohepatitis (NASH): Principal | ICD-10-CM

## 2018-02-28 DIAGNOSIS — K7460 Unspecified cirrhosis of liver: Secondary | ICD-10-CM

## 2018-02-28 NOTE — Telephone Encounter (Signed)
Order faxed to [place]

## 2018-02-28 NOTE — Telephone Encounter (Signed)
Fax order for RUQ U/S,Dx: CIRRHOSIS.

## 2018-02-28 NOTE — Telephone Encounter (Signed)
Received a phone call from [facility] in [place]. Patient showed up with a handwritten order that says "ultrasound" and it has Dr. Oneida Alar name on it. They are needing a corrected order faxed to them at [phone] for ultrasound abdominal. Dr. Oneida Alar okay to order and fax to them? Thank you!

## 2018-03-05 ENCOUNTER — Encounter: Payer: Self-pay | Admitting: Gastroenterology

## 2018-03-05 DIAGNOSIS — K7460 Unspecified cirrhosis of liver: Secondary | ICD-10-CM | POA: Diagnosis not present

## 2018-03-05 DIAGNOSIS — K7581 Nonalcoholic steatohepatitis (NASH): Secondary | ICD-10-CM | POA: Diagnosis not present

## 2018-03-12 NOTE — Telephone Encounter (Signed)
Spoke with [facility] in [place]. Was advised patient had ultrasound on 03/05/18. They are going to fax report over. [name] [name] please be on the look out please. Thank you!

## 2018-03-14 NOTE — Telephone Encounter (Addendum)
PLEASE CALL PT. HER US SHOWS CIRRHOSIS, GALLSTONE, HEART FAILURE, AND GALLSTONES. REPEAT IN 6 MOS.

## 2018-03-14 NOTE — Telephone Encounter (Signed)
PT is aware.

## 2018-03-14 NOTE — Telephone Encounter (Signed)
[place] of abd report was placed in [facility] office chair

## 2018-03-15 NOTE — Telephone Encounter (Signed)
Reminder in epic °

## 2018-04-10 ENCOUNTER — Other Ambulatory Visit: Payer: Self-pay | Admitting: *Deleted

## 2018-04-10 ENCOUNTER — Encounter: Payer: Self-pay | Admitting: *Deleted

## 2018-04-10 ENCOUNTER — Encounter: Payer: Self-pay | Admitting: Gastroenterology

## 2018-04-10 ENCOUNTER — Ambulatory Visit (INDEPENDENT_AMBULATORY_CARE_PROVIDER_SITE_OTHER): Payer: Medicare Other | Admitting: Gastroenterology

## 2018-04-10 ENCOUNTER — Telehealth: Payer: Self-pay

## 2018-04-10 DIAGNOSIS — K7581 Nonalcoholic steatohepatitis (NASH): Secondary | ICD-10-CM | POA: Diagnosis not present

## 2018-04-10 DIAGNOSIS — K7460 Unspecified cirrhosis of liver: Secondary | ICD-10-CM

## 2018-04-10 NOTE — Patient Instructions (Addendum)
PLEASE CALL WITH QUESTIONS OR CONCERNS.  GET LABS DRAWN NEXT WEEK.   PLEASE CALL TO SCHEDULE YOUR UPPER ENDOSCOPY.  TAKE OMEPRAZOLE 3 DAYS A WEEK.  YOU SHOULD HAVE AN ULTRASOUND IN DEC 2019.  FOLLOW UP IN 1 YEAR.

## 2018-04-10 NOTE — Telephone Encounter (Signed)
LMOVM

## 2018-04-10 NOTE — Progress Notes (Addendum)
Subjective:    Patient ID: [name] [name], female    DOB: [birth year], 72 y.o.   MRN: [id number]   [name], MD  HPI Bowels pretty regular. NEEDS STOOL SOFTENER AND MIRALAX PRN.Has intermittent RUQ and LUQ pain. IF DOESN'T GETS IMPACTED/CONSTPATION. CONCERNS SHE MAY DIE DURING AN EGD DUE TO SEDATION. WONDERS IF SHE NEEDS TO SEE ME Q6 MOS. SWALLOWING PRETTY GOOD BUT GETS STRANGLED SOMETIMES MORE OFTEN THAN NOT ON LIQUIDS. APPETITE OK. BREATHING NOT SO GOOD. NOT A LOT OF CHEST PAIN LATELY. NAUSEA/DRY HEAVES: DEPENDS ON HER FLUID LEVEL. KIDNEY DOC STOPPED OMEPRAZOLE IN APR 2019. NOW USING ASA DAILY BUT OMEPRAZOLE PRN.  PT DENIES FEVER, CHILLS, HEMATOCHEZIA, HEMATEMESIS, nausea, vomiting, melena, diarrhea, CHANGE IN BOWEL IN HABITS, OR heartburn or indigestion.  Past Medical History:  Diagnosis Date  . Anemia   . Atrial fibrillation ([place])   . Bursitis   . Cancer ([place])   . Cataract   . Cervical cancer ([employer]) 1991   s/p hysterectomy  . Chronic cellulitis   . Chronic combined systolic and diastolic congestive heart failure, NYHA class 2 (HCC)    LVEF 25-30% with restrictive diastolic filling  . Degenerative joint disease   . Diverticulitis   . Essential hypertension   . Gallstones   . GERD (gastroesophageal reflux disease)   . Gout   . Kidney stones   . Morbid obesity ([place])   . NICM (nonischemic cardiomyopathy) ([place]) 02/22/2017  . Osteoarthritis   . Permanent atrial fibrillation ([place]) 05/04/2009   Qualifier: History of  By: Quentin Cornwall CMA, Janett Billow    . 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 Lovena Le  . Sleep apnea    CPAP   Past Surgical History:  Procedure Laterality Date  . ABDOMINAL HYSTERECTOMY  1991  . COLONOSCOPY  1998   one polyp per patient  . EYE SURGERY    . PACEMAKER INSERTION  Nov 2000   St Jude with revision in 2011  .  PERCUTANEOUS NEPHROLITHOTOMY  April 2012   Allergies  Allergen Reactions  . Cephalexin Shortness Of Breath    sob  . Codeine Anaphylaxis    REACTION: throat swelling  . Contrast Media [Iodinated Diagnostic Agents] Other (See Comments)    Patient states "I have chronic kidney disease so the doctor said no dye in my veins."  . Coreg [Carvedilol] Other (See Comments)    Beta Blockers cause her organs to shut down per patient  . Peppermint Flavor Shortness Of Breath  . Prednisone Anaphylaxis, Shortness Of Breath and Swelling    REACTION: swelling, S.O.B.  . Tape Other (See Comments)    States plastic tape blisters her skin  . Ciprofloxacin Hives  . Latex Itching and Rash  . Penicillins Hives   Current Outpatient Medications  Medication Sig    . allopurinol (ZYLOPRIM) 100 MG tablet Take 100 mg by mouth 2 (two) times daily.    Marland Kitchen aspirin 325 MG tablet Take 325 mg by mouth daily.      . calcitRIOL (ROCALTROL) 0.25 MCG capsule Take 0.25 mcg by mouth every other day.     . calcium carbonate (OS-CAL) 600 MG tablet Take 600 mg by mouth daily.    . COD LIVER OIL PO Take 1 tablet by mouth daily.      Marland Kitchen docusate sodium (COLACE) 100 MG capsule Take  200 mg by mouth daily as needed for mild constipation.     . feeding supplement (BOOST HIGH PROTEIN) LIQD Take 1 Container by mouth daily.    . ferrous sulfate 325 (65 FE) MG tablet Take 325 mg by mouth every evening.     . Glycerin, Adult, 2.1 g SUPP Place 1 suppository rectally daily as needed for moderate constipation.    Marland Kitchen HYDROcodone-acetaminophen (NORCO) 10-325 MG per tablet Take 1 tablet by mouth 2 (two) times daily.     . isosorbide mononitrate (IMDUR) 30 MG 24 hr tablet Take 0.5 tablets (15 mg total) by mouth daily.    . metolazone (ZAROXOLYN) 2.5 MG tablet Take 2.5 mg by mouth as needed. Take once as instructed.     . Multiple Vitamin (MULTIVITAMIN) tablet Take 1 tablet by mouth daily.    Marland Kitchen torsemide (DEMADEX) 20 MG tablet Take 4 tablets (80  mg total) by mouth 2 (two) times daily.    . vitamin B-12 (CYANOCOBALAMIN) 500 MCG tablet Take 500 mcg by mouth daily.    Marland Kitchen nystatin (MYCOSTATIN/NYSTOP) powder Apply 1 application topically every other day.    .       Review of Systems PER HPI OTHERWISE ALL SYSTEMS ARE NEGATIVE.    Objective:   Physical Exam  Constitutional: She is oriented to person, place, and time. She appears well-developed and well-nourished. No distress.  HENT:  Head: Normocephalic and atraumatic.  Mouth/Throat: Oropharynx is clear and moist. No oropharyngeal exudate.  Eyes: Pupils are equal, round, and reactive to light. No scleral icterus.  Neck: Normal range of motion. Neck supple.  Cardiovascular: Normal rate.  Murmur heard. Irregular RHYTHM  Pulmonary/Chest: Effort normal and breath sounds normal. No respiratory distress.  Abdominal: Soft. Bowel sounds are normal. She exhibits no distension. There is no tenderness.  Large pannus  Musculoskeletal: She exhibits edema.  Lymphadenopathy:    She has no cervical adenopathy.  Neurological: She is alert and oriented to person, place, and time.  NO  NEW FOCAL DEFICITS  Psychiatric:  ANXIOUS MOOD, NL AFFECT   Vitals reviewed.     Assessment & Plan:

## 2018-04-10 NOTE — Telephone Encounter (Signed)
Patient called and confirmed she wants to cancel EGD. She reports due to her history she does not want to take the chance. Called Endo and LMOVM to cancel. FYI to SLF

## 2018-04-10 NOTE — Telephone Encounter (Signed)
REVIEWED.  

## 2018-04-10 NOTE — Telephone Encounter (Signed)
Pt called and said she would like to cancel her EGD. She said she doesn't want to r/s. Pt will have lab work drawn and have her u/s when its time.

## 2018-04-10 NOTE — Assessment & Plan Note (Signed)
WELL COMPENSATED DISEASE, BMI REMAINS > 40.  CALL WITH QUESTIONS OR CONCERNS. GET LABS DRAWN NEXT WEEK: CMP/PT/INR/CBC. PLEASE CALL TO SCHEDULE YOUR UPPER ENDOSCOPY. DISCUSSED PROCEDURE, BENEFITS, & RISKS: < 1% chance of medication reaction, bleeding, OR perforation.  TAKE OMEPRAZOLE 3 DAYS A WEEK. HAVE AN ULTRASOUND IN DEC 2019.  FOLLOW UP IN 1 YEAR.

## 2018-04-15 DIAGNOSIS — I5042 Chronic combined systolic (congestive) and diastolic (congestive) heart failure: Secondary | ICD-10-CM | POA: Diagnosis not present

## 2018-04-15 DIAGNOSIS — D631 Anemia in chronic kidney disease: Secondary | ICD-10-CM | POA: Diagnosis not present

## 2018-04-15 DIAGNOSIS — N184 Chronic kidney disease, stage 4 (severe): Secondary | ICD-10-CM | POA: Diagnosis not present

## 2018-04-15 DIAGNOSIS — I129 Hypertensive chronic kidney disease with stage 1 through stage 4 chronic kidney disease, or unspecified chronic kidney disease: Secondary | ICD-10-CM | POA: Diagnosis not present

## 2018-04-15 DIAGNOSIS — N2581 Secondary hyperparathyroidism of renal origin: Secondary | ICD-10-CM | POA: Diagnosis not present

## 2018-04-15 DIAGNOSIS — G4733 Obstructive sleep apnea (adult) (pediatric): Secondary | ICD-10-CM | POA: Diagnosis not present

## 2018-04-15 DIAGNOSIS — M109 Gout, unspecified: Secondary | ICD-10-CM | POA: Diagnosis not present

## 2018-04-15 DIAGNOSIS — N200 Calculus of kidney: Secondary | ICD-10-CM | POA: Diagnosis not present

## 2018-04-15 DIAGNOSIS — Z87440 Personal history of urinary (tract) infections: Secondary | ICD-10-CM | POA: Diagnosis not present

## 2018-04-15 DIAGNOSIS — I480 Paroxysmal atrial fibrillation: Secondary | ICD-10-CM | POA: Diagnosis not present

## 2018-04-15 DIAGNOSIS — R609 Edema, unspecified: Secondary | ICD-10-CM | POA: Diagnosis not present

## 2018-05-30 ENCOUNTER — Encounter: Payer: Self-pay | Admitting: Internal Medicine

## 2018-05-30 ENCOUNTER — Ambulatory Visit (INDEPENDENT_AMBULATORY_CARE_PROVIDER_SITE_OTHER): Payer: Medicare Other | Admitting: Internal Medicine

## 2018-05-30 ENCOUNTER — Other Ambulatory Visit (HOSPITAL_COMMUNITY): Payer: Medicare Other

## 2018-05-30 VITALS — BP 110/60 | HR 91 | Ht 67.0 in | Wt 257.0 lb

## 2018-05-30 DIAGNOSIS — I482 Chronic atrial fibrillation: Secondary | ICD-10-CM

## 2018-05-30 DIAGNOSIS — I4821 Permanent atrial fibrillation: Secondary | ICD-10-CM

## 2018-05-30 LAB — CUP PACEART INCLINIC DEVICE CHECK
Battery Remaining Longevity: 80 mo
Brady Statistic RV Percent Paced: 99.59 %
Date Time Interrogation Session: 20190905130411
Implantable Lead Implant Date: 20001110
Implantable Pulse Generator Implant Date: 20111202
Lead Channel Pacing Threshold Amplitude: 0.75 V
Lead Channel Pacing Threshold Amplitude: 0.75 V
Lead Channel Pacing Threshold Pulse Width: 0.4 ms
Lead Channel Sensing Intrinsic Amplitude: 12 mV
MDC IDC LEAD LOCATION: 753860
MDC IDC MSMT BATTERY VOLTAGE: 2.9 V
MDC IDC MSMT LEADCHNL RV IMPEDANCE VALUE: 562.5 Ohm
MDC IDC MSMT LEADCHNL RV PACING THRESHOLD PULSEWIDTH: 0.4 ms
MDC IDC SET LEADCHNL RV PACING AMPLITUDE: 0.875
MDC IDC SET LEADCHNL RV PACING PULSEWIDTH: 0.4 ms
MDC IDC SET LEADCHNL RV SENSING SENSITIVITY: 2 mV
Pulse Gen Model: 1210
Pulse Gen Serial Number: 7171350

## 2018-05-30 MED ORDER — METOLAZONE 2.5 MG PO TABS: 2.5000 mg | ORAL_TABLET | ORAL | 1 refills | Status: DC | PRN

## 2018-05-30 NOTE — Progress Notes (Signed)
HPI [name] [name] returns today for followup. 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 She has hypertension, and has refused to take any anticoagulation other than aspirin for the past 13 years. 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 She has developed worsening renal insufficiency with a serum creatinine over2. She has lost over 100 lbs.  Allergies  Allergen Reactions  . Cephalexin Shortness Of Breath    sob  . Codeine Anaphylaxis    REACTION: throat swelling  . Contrast Media [Iodinated Diagnostic Agents] Other (See Comments)    Patient states "I have chronic kidney disease so the doctor said no dye in my veins."  . Coreg [Carvedilol] Other (See Comments)    Beta Blockers cause her organs to shut down per patient  . Peppermint Flavor Shortness Of Breath  . Prednisone Anaphylaxis, Shortness Of Breath and Swelling    REACTION: swelling, S.O.B.  . Tape Other (See Comments)    States plastic tape blisters her skin  . Ciprofloxacin Hives  . Latex Itching and Rash  . Penicillins Hives     Current Outpatient Medications  Medication Sig Dispense Refill  . allopurinol (ZYLOPRIM) 100 MG tablet Take 100 mg by mouth 2 (two) times daily.    Marland Kitchen aspirin 325 MG tablet Take 325 mg by mouth daily.      . calcitRIOL (ROCALTROL) 0.25 MCG capsule Take 0.25 mcg by mouth every other day.     . calcium carbonate (OS-CAL) 600 MG tablet Take 600 mg by mouth daily.    . COD LIVER OIL PO Take 1 tablet by mouth daily.      Marland Kitchen docusate sodium (COLACE) 100 MG capsule Take 200 mg by mouth daily as needed for mild constipation.     . feeding supplement (BOOST HIGH PROTEIN) LIQD Take  1 Container by mouth daily.    . ferrous sulfate 325 (65 FE) MG tablet Take 325 mg by mouth every evening.     . Glycerin, Adult, 2.1 g SUPP Place 1 suppository rectally daily as needed for moderate constipation.  0  . HYDROcodone-acetaminophen (NORCO) 10-325 MG per tablet Take 1 tablet by mouth 2 (two) times daily.     . isosorbide mononitrate (IMDUR) 30 MG 24 hr tablet Take 0.5 tablets (15 mg total) by mouth daily. 90 tablet 2  . metolazone (ZAROXOLYN) 2.5 MG tablet Take 2.5 mg by mouth as needed. Take once as instructed.     . Multiple Vitamin (MULTIVITAMIN) tablet Take 1 tablet by mouth daily.    Marland Kitchen nystatin (MYCOSTATIN/NYSTOP) powder Apply 1 application topically every other day.    . nystatin-triamcinolone ointment (MYCOLOG) Apply topically 2 (two) times daily. 30 g 0  . torsemide (DEMADEX) 20 MG tablet Take 4 tablets (80 mg total) by mouth 2 (two) times daily. 224 tablet 11  . vitamin B-12 (CYANOCOBALAMIN) 500 MCG tablet Take 500 mcg by mouth daily.     No current facility-administered medications for this visit.      Past Medical History:  Diagnosis Date  . Anemia   . Atrial fibrillation ([place])   . Bursitis   . Cancer ([place])   . Cataract   . Cervical cancer ([place]) 1991   s/p hysterectomy  . Chronic cellulitis   .  Chronic combined systolic and diastolic congestive heart failure, NYHA class 2 (HCC)    LVEF 25-30% with restrictive diastolic filling  . Degenerative joint disease   . Diverticulitis   . Essential hypertension   . Gallstones   . GERD (gastroesophageal reflux disease)   . Gout   . Kidney stones   . Morbid obesity ([place])   . NICM (nonischemic cardiomyopathy) ([place]) 02/22/2017  . Osteoarthritis   . Permanent atrial fibrillation ([place]) 05/04/2009   Qualifier: History of  By: Quentin Cornwall CMA, Janett Billow    . 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  Lovena Le  . Sleep apnea    CPAP    ROS:   All systems reviewed and negative except as noted in the HPI.   Past Surgical History:  Procedure Laterality Date  . ABDOMINAL HYSTERECTOMY  1991  . COLONOSCOPY  1998   one polyp per patient  . EYE SURGERY    . PACEMAKER INSERTION  Nov 2000   St Jude with revision in 2011  . PERCUTANEOUS NEPHROLITHOTOMY  April 2012     Family History  Problem Relation Age of Onset  . Heart attack Father   . Stroke Mother   . Diabetes Sister   . Colon cancer Other        seven family members  . Liver disease Neg Hx   . Other Neg Hx      Social History   Socioeconomic History  . Marital status: Widowed    Spouse name: Not on file  . Number of children: Not on file  . Years of education: Not on file  . Highest education level: Not on file  Occupational History  . Not on file  Social Needs  . Financial resource strain: Not on file  . Food insecurity:    Worry: Not on file    Inability: Not on file  . Transportation needs:    Medical: Not on file    Non-medical: Not on file  Tobacco Use  . Smoking status: Former Smoker    Packs/day: 1.00    Types: Cigarettes    Last attempt to quit: 09/26/1979    Years since quitting: 38.7  . Smokeless tobacco: Never Used  Substance and Sexual Activity  . Alcohol use: No  . Drug use: No  . Sexual activity: Never  Lifestyle  . Physical activity:    Days per week: Not on file    Minutes per session: Not on file  . Stress: Not on file  Relationships  . Social connections:    Talks on phone: Not on file    Gets together: Not on file    Attends religious service: Not on file    Active member of club or organization: Not on file    Attends meetings of clubs or organizations: Not on file    Relationship status: Not on file  . Intimate partner violence:    Fear of current or ex partner: Not on file    Emotionally abused: Not on file    Physically abused: Not on file    Forced sexual activity: Not  on file  Other Topics Concern  . Not on file  Social History Narrative  . Not on file     BP 110/60 (BP Location: Right Wrist)   Pulse 91   Ht 5' 7"  (  1.702 m)   Wt 257 lb (116.6 kg)   SpO2 97%   BMI 40.25 kg/m   Physical Exam:  Morbidly obese appearing NAD HEENT: Unremarkable Neck:  7 cm JVD, no thyromegally Lymphatics:  No adenopathy Back:  No CVA tenderness Lungs:  Clear with no wheezes HEART:  Regular rate rhythm, no murmurs, no rubs, no clicks Abd:  soft, positive bowel sounds, no organomegally, no rebound, no guarding Ext:  2 plus pulses, no edema, no cyanosis, no clubbing Skin:  No rashes no nodules Neuro:  CN II through XII intact, motor grossly intact  EKG -none  DEVICE  Normal device function.  See PaceArt for details.   Assess/Plan: 1. Atrial fib - her ventricular rate is well controlled. 2. Chronic systolic and diastolic heart failure - her symptoms are class 3. She is very sedentary. She will continue her diuretic therapy including as needed metolzone. Her weight has been stable. 3. Obesity - she has lost over 100 lbs but she has stabilized. She is maintaining a low sodium diet. 4. PPM - her St. Jude single chamber PM is working normally. We will recheck in several months.  Mikle Bosworth.D.

## 2018-05-30 NOTE — Patient Instructions (Addendum)
Medication Instructions:  Your physician recommends that you continue on your current medications as directed. Please refer to the Current Medication list given to you today.   Labwork: NONE   Testing/Procedures: NONE   Follow-Up: Your physician recommends that you schedule a follow-up appointment in: [facility] in 6 Months    Any Other Special Instructions Will Be Listed Below (If Applicable).     If you need a refill on your cardiac medications before your next appointment, please call your pharmacy. Thank you for choosing [facility]!

## 2018-06-11 ENCOUNTER — Ambulatory Visit (HOSPITAL_COMMUNITY): Admit: 2018-06-11 | Payer: Medicare Other | Admitting: Gastroenterology

## 2018-06-11 ENCOUNTER — Encounter (HOSPITAL_COMMUNITY): Payer: Self-pay

## 2018-06-11 SURGERY — ESOPHAGOGASTRODUODENOSCOPY (EGD) WITH PROPOFOL
Anesthesia: Monitor Anesthesia Care

## 2018-06-12 ENCOUNTER — Other Ambulatory Visit: Payer: Self-pay | Admitting: Physician Assistant

## 2018-06-12 DIAGNOSIS — I5043 Acute on chronic combined systolic (congestive) and diastolic (congestive) heart failure: Secondary | ICD-10-CM

## 2018-06-20 ENCOUNTER — Encounter (HOSPITAL_COMMUNITY): Payer: Self-pay | Admitting: Emergency Medicine

## 2018-06-20 ENCOUNTER — Inpatient Hospital Stay (HOSPITAL_COMMUNITY)
Admission: EM | Admit: 2018-06-20 | Discharge: 2018-07-02 | DRG: 286 | Disposition: A | Discharge status: Home Health | Payer: Medicare Other | Attending: Internal Medicine | Admitting: Internal Medicine

## 2018-06-20 ENCOUNTER — Other Ambulatory Visit: Payer: Self-pay

## 2018-06-20 ENCOUNTER — Emergency Department (HOSPITAL_COMMUNITY): Payer: Medicare Other

## 2018-06-20 DIAGNOSIS — N185 Chronic kidney disease, stage 5: Secondary | ICD-10-CM | POA: Diagnosis present

## 2018-06-20 DIAGNOSIS — Z79899 Other long term (current) drug therapy: Secondary | ICD-10-CM

## 2018-06-20 DIAGNOSIS — R55 Syncope and collapse: Secondary | ICD-10-CM | POA: Diagnosis present

## 2018-06-20 DIAGNOSIS — K7581 Nonalcoholic steatohepatitis (NASH): Secondary | ICD-10-CM | POA: Diagnosis present

## 2018-06-20 DIAGNOSIS — R0902 Hypoxemia: Secondary | ICD-10-CM | POA: Diagnosis not present

## 2018-06-20 DIAGNOSIS — I5082 Biventricular heart failure: Secondary | ICD-10-CM | POA: Diagnosis present

## 2018-06-20 DIAGNOSIS — R079 Chest pain, unspecified: Secondary | ICD-10-CM | POA: Diagnosis not present

## 2018-06-20 DIAGNOSIS — I130 Hypertensive heart and chronic kidney disease with heart failure and stage 1 through stage 4 chronic kidney disease, or unspecified chronic kidney disease: Principal | ICD-10-CM | POA: Diagnosis present

## 2018-06-20 DIAGNOSIS — Z8249 Family history of ischemic heart disease and other diseases of the circulatory system: Secondary | ICD-10-CM

## 2018-06-20 DIAGNOSIS — Z7982 Long term (current) use of aspirin: Secondary | ICD-10-CM

## 2018-06-20 DIAGNOSIS — K219 Gastro-esophageal reflux disease without esophagitis: Secondary | ICD-10-CM | POA: Diagnosis present

## 2018-06-20 DIAGNOSIS — S99922A Unspecified injury of left foot, initial encounter: Secondary | ICD-10-CM | POA: Diagnosis present

## 2018-06-20 DIAGNOSIS — I272 Pulmonary hypertension, unspecified: Secondary | ICD-10-CM | POA: Diagnosis present

## 2018-06-20 DIAGNOSIS — X58XXXA Exposure to other specified factors, initial encounter: Secondary | ICD-10-CM | POA: Diagnosis present

## 2018-06-20 DIAGNOSIS — I472 Ventricular tachycardia: Secondary | ICD-10-CM | POA: Diagnosis not present

## 2018-06-20 DIAGNOSIS — Z880 Allergy status to penicillin: Secondary | ICD-10-CM

## 2018-06-20 DIAGNOSIS — J181 Lobar pneumonia, unspecified organism: Secondary | ICD-10-CM

## 2018-06-20 DIAGNOSIS — R0600 Dyspnea, unspecified: Secondary | ICD-10-CM

## 2018-06-20 DIAGNOSIS — I428 Other cardiomyopathies: Secondary | ICD-10-CM | POA: Diagnosis present

## 2018-06-20 DIAGNOSIS — Z91040 Latex allergy status: Secondary | ICD-10-CM

## 2018-06-20 DIAGNOSIS — D509 Iron deficiency anemia, unspecified: Secondary | ICD-10-CM | POA: Diagnosis present

## 2018-06-20 DIAGNOSIS — I5043 Acute on chronic combined systolic (congestive) and diastolic (congestive) heart failure: Secondary | ICD-10-CM | POA: Diagnosis not present

## 2018-06-20 DIAGNOSIS — Z66 Do not resuscitate: Secondary | ICD-10-CM | POA: Diagnosis present

## 2018-06-20 DIAGNOSIS — G4733 Obstructive sleep apnea (adult) (pediatric): Secondary | ICD-10-CM | POA: Diagnosis present

## 2018-06-20 DIAGNOSIS — I872 Venous insufficiency (chronic) (peripheral): Secondary | ICD-10-CM | POA: Diagnosis present

## 2018-06-20 DIAGNOSIS — Z8541 Personal history of malignant neoplasm of cervix uteri: Secondary | ICD-10-CM

## 2018-06-20 DIAGNOSIS — I081 Rheumatic disorders of both mitral and tricuspid valves: Secondary | ICD-10-CM | POA: Diagnosis present

## 2018-06-20 DIAGNOSIS — D696 Thrombocytopenia, unspecified: Secondary | ICD-10-CM | POA: Diagnosis not present

## 2018-06-20 DIAGNOSIS — Z800 Family history of malignant neoplasm of digestive organs: Secondary | ICD-10-CM

## 2018-06-20 DIAGNOSIS — J189 Pneumonia, unspecified organism: Secondary | ICD-10-CM | POA: Diagnosis not present

## 2018-06-20 DIAGNOSIS — Z87442 Personal history of urinary calculi: Secondary | ICD-10-CM

## 2018-06-20 DIAGNOSIS — I425 Other restrictive cardiomyopathy: Secondary | ICD-10-CM | POA: Diagnosis present

## 2018-06-20 DIAGNOSIS — Z6841 Body Mass Index (BMI) 40.0 and over, adult: Secondary | ICD-10-CM

## 2018-06-20 DIAGNOSIS — Z79891 Long term (current) use of opiate analgesic: Secondary | ICD-10-CM

## 2018-06-20 DIAGNOSIS — J9811 Atelectasis: Secondary | ICD-10-CM | POA: Diagnosis present

## 2018-06-20 DIAGNOSIS — I10 Essential (primary) hypertension: Secondary | ICD-10-CM | POA: Diagnosis present

## 2018-06-20 DIAGNOSIS — Z90710 Acquired absence of both cervix and uterus: Secondary | ICD-10-CM

## 2018-06-20 DIAGNOSIS — E611 Iron deficiency: Secondary | ICD-10-CM | POA: Diagnosis present

## 2018-06-20 DIAGNOSIS — N184 Chronic kidney disease, stage 4 (severe): Secondary | ICD-10-CM | POA: Diagnosis not present

## 2018-06-20 DIAGNOSIS — K7460 Unspecified cirrhosis of liver: Secondary | ICD-10-CM | POA: Diagnosis present

## 2018-06-20 DIAGNOSIS — I878 Other specified disorders of veins: Secondary | ICD-10-CM | POA: Diagnosis present

## 2018-06-20 DIAGNOSIS — I4821 Permanent atrial fibrillation: Secondary | ICD-10-CM | POA: Diagnosis present

## 2018-06-20 DIAGNOSIS — L97929 Non-pressure chronic ulcer of unspecified part of left lower leg with unspecified severity: Secondary | ICD-10-CM | POA: Diagnosis present

## 2018-06-20 DIAGNOSIS — Z87891 Personal history of nicotine dependence: Secondary | ICD-10-CM

## 2018-06-20 DIAGNOSIS — I110 Hypertensive heart disease with heart failure: Secondary | ICD-10-CM | POA: Diagnosis not present

## 2018-06-20 DIAGNOSIS — Z885 Allergy status to narcotic agent status: Secondary | ICD-10-CM

## 2018-06-20 DIAGNOSIS — R011 Cardiac murmur, unspecified: Secondary | ICD-10-CM | POA: Diagnosis present

## 2018-06-20 DIAGNOSIS — Z823 Family history of stroke: Secondary | ICD-10-CM

## 2018-06-20 DIAGNOSIS — M1990 Unspecified osteoarthritis, unspecified site: Secondary | ICD-10-CM | POA: Diagnosis present

## 2018-06-20 DIAGNOSIS — Z888 Allergy status to other drugs, medicaments and biological substances status: Secondary | ICD-10-CM

## 2018-06-20 DIAGNOSIS — M109 Gout, unspecified: Secondary | ICD-10-CM | POA: Diagnosis present

## 2018-06-20 DIAGNOSIS — Z833 Family history of diabetes mellitus: Secondary | ICD-10-CM

## 2018-06-20 DIAGNOSIS — Z950 Presence of cardiac pacemaker: Secondary | ICD-10-CM

## 2018-06-20 LAB — CBC
HEMATOCRIT: 34.6 % — AB (ref 36.0–46.0)
HEMOGLOBIN: 11 g/dL — AB (ref 12.0–15.0)
MCH: 31.3 pg (ref 26.0–34.0)
MCHC: 31.8 g/dL (ref 30.0–36.0)
MCV: 98.3 fL (ref 78.0–100.0)
Platelets: 121 10*3/uL — ABNORMAL LOW (ref 150–400)
RBC: 3.52 MIL/uL — ABNORMAL LOW (ref 3.87–5.11)
RDW: 14.8 % (ref 11.5–15.5)
WBC: 6.3 10*3/uL (ref 4.0–10.5)

## 2018-06-20 LAB — BASIC METABOLIC PANEL
ANION GAP: 12 (ref 5–15)
BUN: 81 mg/dL — ABNORMAL HIGH (ref 8–23)
CALCIUM: 9.8 mg/dL (ref 8.9–10.3)
CO2: 35 mmol/L — ABNORMAL HIGH (ref 22–32)
Chloride: 92 mmol/L — ABNORMAL LOW (ref 98–111)
Creatinine, Ser: 2.72 mg/dL — ABNORMAL HIGH (ref 0.44–1.00)
GFR calc non Af Amer: 16 mL/min — ABNORMAL LOW (ref >60)
GFR, EST AFRICAN AMERICAN: 19 mL/min — AB (ref >60)
Glucose, Bld: 123 mg/dL — ABNORMAL HIGH (ref 70–99)
POTASSIUM: 3.5 mmol/L (ref 3.5–5.1)
SODIUM: 139 mmol/L (ref 135–145)

## 2018-06-20 LAB — I-STAT TROPONIN, ED: TROPONIN I, POC: 0 ng/mL (ref 0.00–0.08)

## 2018-06-20 LAB — BRAIN NATRIURETIC PEPTIDE: B NATRIURETIC PEPTIDE 5: 160.6 pg/mL — AB (ref 0.0–100.0)

## 2018-06-20 NOTE — ED Triage Notes (Signed)
Pt presents to ER for CP and SOB increasingly worse since Sunday; pt reporting today with tingling to the face; pt appears anxious, denies hx of anxiety; family at bedside reporting Sunday she had syncopal episode while in her motorized wheelchair and ran into a wall, bruising noted to L shoulder/deltoid

## 2018-06-21 ENCOUNTER — Other Ambulatory Visit: Payer: Self-pay

## 2018-06-21 ENCOUNTER — Observation Stay (HOSPITAL_BASED_OUTPATIENT_CLINIC_OR_DEPARTMENT_OTHER): Payer: Medicare Other

## 2018-06-21 DIAGNOSIS — Z950 Presence of cardiac pacemaker: Secondary | ICD-10-CM | POA: Diagnosis not present

## 2018-06-21 DIAGNOSIS — N184 Chronic kidney disease, stage 4 (severe): Secondary | ICD-10-CM | POA: Diagnosis not present

## 2018-06-21 DIAGNOSIS — R55 Syncope and collapse: Secondary | ICD-10-CM | POA: Diagnosis not present

## 2018-06-21 DIAGNOSIS — I340 Nonrheumatic mitral (valve) insufficiency: Secondary | ICD-10-CM

## 2018-06-21 DIAGNOSIS — R079 Chest pain, unspecified: Secondary | ICD-10-CM | POA: Diagnosis not present

## 2018-06-21 DIAGNOSIS — I482 Chronic atrial fibrillation: Secondary | ICD-10-CM | POA: Diagnosis not present

## 2018-06-21 DIAGNOSIS — E611 Iron deficiency: Secondary | ICD-10-CM | POA: Diagnosis not present

## 2018-06-21 DIAGNOSIS — I10 Essential (primary) hypertension: Secondary | ICD-10-CM | POA: Diagnosis not present

## 2018-06-21 DIAGNOSIS — I5043 Acute on chronic combined systolic (congestive) and diastolic (congestive) heart failure: Secondary | ICD-10-CM | POA: Diagnosis not present

## 2018-06-21 LAB — BASIC METABOLIC PANEL
Anion gap: 14 (ref 5–15)
Anion gap: 17 — ABNORMAL HIGH (ref 5–15)
BUN: 82 mg/dL — AB (ref 8–23)
BUN: 85 mg/dL — AB (ref 8–23)
CHLORIDE: 92 mmol/L — AB (ref 98–111)
CHLORIDE: 93 mmol/L — AB (ref 98–111)
CO2: 32 mmol/L (ref 22–32)
CO2: 30 mmol/L (ref 22–32)
CREATININE: 2.7 mg/dL — AB (ref 0.44–1.00)
CREATININE: 2.44 mg/dL — AB (ref 0.44–1.00)
Calcium: 9.5 mg/dL (ref 8.9–10.3)
Calcium: 9.9 mg/dL (ref 8.9–10.3)
GFR calc Af Amer: 19 mL/min — ABNORMAL LOW (ref >60)
GFR calc Af Amer: 21 mL/min — ABNORMAL LOW (ref >60)
GFR calc non Af Amer: 16 mL/min — ABNORMAL LOW (ref >60)
GFR calc non Af Amer: 19 mL/min — ABNORMAL LOW (ref >60)
GLUCOSE: 114 mg/dL — AB (ref 70–99)
GLUCOSE: 80 mg/dL (ref 70–99)
Potassium: 3.1 mmol/L — ABNORMAL LOW (ref 3.5–5.1)
Potassium: 3.8 mmol/L (ref 3.5–5.1)
SODIUM: 138 mmol/L (ref 135–145)
SODIUM: 140 mmol/L (ref 135–145)

## 2018-06-21 LAB — HEMOGLOBIN A1C
HEMOGLOBIN A1C: 5.5 % (ref 4.8–5.6)
Mean Plasma Glucose: 111.15 mg/dL

## 2018-06-21 LAB — LIPID PANEL
CHOLESTEROL: 114 mg/dL (ref 0–200)
HDL: 47 mg/dL (ref >40)
LDL Cholesterol: 57 mg/dL (ref 0–99)
TRIGLYCERIDES: 48 mg/dL (ref <150)
Total CHOL/HDL Ratio: 2.4 RATIO
VLDL: 10 mg/dL (ref 0–40)

## 2018-06-21 LAB — TROPONIN I
TROPONIN I: 0.05 ng/mL — AB (ref <0.03)
Troponin I: 0.04 ng/mL (ref <0.03)
Troponin I: 0.05 ng/mL (ref <0.03)

## 2018-06-21 LAB — ECHOCARDIOGRAM COMPLETE
Height: 66 in
Weight: 4186.98 oz

## 2018-06-21 LAB — LACTIC ACID, PLASMA: Lactic Acid, Venous: 0.7 mmol/L (ref 0.5–1.9)

## 2018-06-21 LAB — MAGNESIUM: MAGNESIUM: 2.7 mg/dL — AB (ref 1.7–2.4)

## 2018-06-21 MED ORDER — ZOLPIDEM TARTRATE 5 MG PO TABS
5.0000 mg | ORAL_TABLET | Freq: Every evening | ORAL | Status: DC | PRN
  Administered 2018-06-21 – 2018-06-22 (×2): 5 mg via ORAL
  Filled 2018-06-21 (×2): qty 1

## 2018-06-21 MED ORDER — ALLOPURINOL 100 MG PO TABS
100.0000 mg | ORAL_TABLET | Freq: Two times a day (BID) | ORAL | Status: DC
  Administered 2018-06-21 – 2018-07-02 (×23): 100 mg via ORAL
  Filled 2018-06-21 (×23): qty 1

## 2018-06-21 MED ORDER — GLYCERIN (LAXATIVE) 2.1 G RE SUPP
1.0000 | Freq: Every day | RECTAL | Status: DC | PRN
  Administered 2018-06-22 – 2018-06-26 (×2): 1 via RECTAL
  Filled 2018-06-21 (×5): qty 1

## 2018-06-21 MED ORDER — DOCUSATE SODIUM 100 MG PO CAPS
200.0000 mg | ORAL_CAPSULE | Freq: Every day | ORAL | Status: DC | PRN
  Administered 2018-06-24: 200 mg via ORAL
  Filled 2018-06-21: qty 2

## 2018-06-21 MED ORDER — FUROSEMIDE 10 MG/ML IJ SOLN
80.0000 mg | Freq: Two times a day (BID) | INTRAMUSCULAR | Status: DC
  Administered 2018-06-21 – 2018-06-23 (×6): 80 mg via INTRAVENOUS
  Filled 2018-06-21 (×7): qty 8

## 2018-06-21 MED ORDER — CYANOCOBALAMIN 500 MCG PO TABS
500.0000 ug | ORAL_TABLET | Freq: Every day | ORAL | Status: DC
  Administered 2018-06-21 – 2018-06-28 (×8): 500 ug via ORAL
  Filled 2018-06-21 (×9): qty 1

## 2018-06-21 MED ORDER — HYDROCODONE-ACETAMINOPHEN 10-325 MG PO TABS
1.0000 | ORAL_TABLET | Freq: Three times a day (TID) | ORAL | Status: DC
  Administered 2018-06-21 – 2018-07-02 (×34): 1 via ORAL
  Filled 2018-06-21 (×36): qty 1

## 2018-06-21 MED ORDER — ASPIRIN 325 MG PO TABS
325.0000 mg | ORAL_TABLET | Freq: Every evening | ORAL | Status: DC
  Administered 2018-06-21 – 2018-07-01 (×11): 325 mg via ORAL
  Filled 2018-06-21 (×13): qty 1

## 2018-06-21 MED ORDER — FUROSEMIDE 10 MG/ML IJ SOLN: 60.0000 mg | Freq: Two times a day (BID) | INTRAMUSCULAR | Status: DC

## 2018-06-21 MED ORDER — POTASSIUM CHLORIDE CRYS ER 20 MEQ PO TBCR
40.0000 meq | EXTENDED_RELEASE_TABLET | Freq: Once | ORAL | Status: AC
  Administered 2018-06-21: 40 meq via ORAL
  Filled 2018-06-21: qty 2

## 2018-06-21 MED ORDER — NITROGLYCERIN 0.4 MG SL SUBL: 0.4000 mg | SUBLINGUAL_TABLET | SUBLINGUAL | Status: DC | PRN

## 2018-06-21 MED ORDER — FUROSEMIDE 10 MG/ML IJ SOLN
40.0000 mg | Freq: Once | INTRAMUSCULAR | Status: DC
  Filled 2018-06-21: qty 4

## 2018-06-21 MED ORDER — HYDRALAZINE HCL 20 MG/ML IJ SOLN: 5.0000 mg | INTRAMUSCULAR | Status: DC | PRN

## 2018-06-21 MED ORDER — NYSTATIN 100000 UNIT/GM EX POWD
Freq: Every day | CUTANEOUS | Status: DC
  Administered 2018-06-22 – 2018-07-02 (×9): via TOPICAL
  Filled 2018-06-21: qty 15

## 2018-06-21 MED ORDER — CALCITRIOL 0.25 MCG PO CAPS
0.2500 ug | ORAL_CAPSULE | ORAL | Status: DC
  Administered 2018-06-21 – 2018-07-01 (×8): 0.25 ug via ORAL
  Filled 2018-06-21 (×10): qty 1

## 2018-06-21 MED ORDER — SODIUM CHLORIDE 0.9% FLUSH
3.0000 mL | Freq: Two times a day (BID) | INTRAVENOUS | Status: DC
  Administered 2018-06-21 – 2018-07-02 (×19): 3 mL via INTRAVENOUS

## 2018-06-21 MED ORDER — POTASSIUM CHLORIDE CRYS ER 20 MEQ PO TBCR
20.0000 meq | EXTENDED_RELEASE_TABLET | Freq: Two times a day (BID) | ORAL | Status: DC
  Administered 2018-06-21 – 2018-07-02 (×22): 20 meq via ORAL
  Filled 2018-06-21 (×22): qty 1

## 2018-06-21 MED ORDER — CALCIUM CARBONATE 1250 (500 CA) MG PO TABS
1250.0000 mg | ORAL_TABLET | Freq: Every day | ORAL | Status: DC
  Administered 2018-06-21 – 2018-07-01 (×11): 1250 mg via ORAL
  Filled 2018-06-21 (×12): qty 1

## 2018-06-21 MED ORDER — HYDROXYZINE HCL 10 MG PO TABS
10.0000 mg | ORAL_TABLET | Freq: Three times a day (TID) | ORAL | Status: DC | PRN
  Filled 2018-06-21: qty 1

## 2018-06-21 MED ORDER — ADULT MULTIVITAMIN W/MINERALS CH
1.0000 | ORAL_TABLET | Freq: Every day | ORAL | Status: DC
  Administered 2018-06-21 – 2018-07-02 (×12): 1 via ORAL
  Filled 2018-06-21 (×12): qty 1

## 2018-06-21 MED ORDER — ISOSORBIDE MONONITRATE ER 30 MG PO TB24
15.0000 mg | ORAL_TABLET | Freq: Every day | ORAL | Status: DC
  Administered 2018-06-21 – 2018-07-02 (×12): 15 mg via ORAL
  Filled 2018-06-21 (×12): qty 1

## 2018-06-21 MED ORDER — BOOST PLUS PO LIQD
1.0000 | Freq: Every day | ORAL | Status: DC
  Filled 2018-06-21 (×2): qty 237

## 2018-06-21 MED ORDER — ACETAMINOPHEN 325 MG PO TABS
650.0000 mg | ORAL_TABLET | ORAL | Status: DC | PRN
  Filled 2018-06-21: qty 2

## 2018-06-21 MED ORDER — SODIUM CHLORIDE 0.9% FLUSH
3.0000 mL | INTRAVENOUS | Status: DC | PRN
  Administered 2018-06-27 – 2018-06-28 (×2): 3 mL via INTRAVENOUS
  Filled 2018-06-21 (×2): qty 3

## 2018-06-21 MED ORDER — SODIUM CHLORIDE 0.9 % IV SOLN
100.0000 mg | Freq: Once | INTRAVENOUS | Status: DC
  Filled 2018-06-21: qty 100

## 2018-06-21 MED ORDER — HEPARIN SODIUM (PORCINE) 5000 UNIT/ML IJ SOLN
5000.0000 [IU] | Freq: Three times a day (TID) | INTRAMUSCULAR | Status: DC
  Filled 2018-06-21 (×3): qty 1

## 2018-06-21 MED ORDER — SODIUM CHLORIDE 0.9 % IV SOLN: 250.0000 mL | INTRAVENOUS | Status: DC | PRN

## 2018-06-21 MED ORDER — HYDROCODONE-ACETAMINOPHEN 5-325 MG PO TABS
1.0000 | ORAL_TABLET | Freq: Four times a day (QID) | ORAL | Status: DC | PRN
  Administered 2018-06-21 – 2018-06-28 (×2): 1 via ORAL
  Filled 2018-06-21: qty 1
  Filled 2018-06-21: qty 2

## 2018-06-21 MED ORDER — FERROUS SULFATE 325 (65 FE) MG PO TABS
325.0000 mg | ORAL_TABLET | Freq: Every evening | ORAL | Status: DC
  Administered 2018-06-21 – 2018-07-01 (×11): 325 mg via ORAL
  Filled 2018-06-21 (×11): qty 1

## 2018-06-21 MED ORDER — ASPIRIN 81 MG PO CHEW
324.0000 mg | CHEWABLE_TABLET | Freq: Once | ORAL | Status: AC
  Administered 2018-06-21: 324 mg via ORAL
  Filled 2018-06-21: qty 4

## 2018-06-21 NOTE — ED Notes (Signed)
MD Niu at bedside  

## 2018-06-21 NOTE — Progress Notes (Signed)
Assisted pt. With putting her home bipap unit together. Machine appears to be intact, no frayed wires or cords. Pt. Places her unit on herself when ready.

## 2018-06-21 NOTE — Hospital Discharge Follow-Up (Addendum)
History and Physical    [name] [name]:[id number] DOB: Feb 08, 1946 DOA: 06/20/2018  Referring MD/NP/PA:   PCP: [name], MD   Patient coming from:  The patient is coming from home.  At baseline, pt is independent for most of ADL.   Chief Complaint: SOB and chest pain  HPI: [name] [name] is a 72 y.o. female with medical history significant of CHF with EF of 35%, hypertension, GERD, gout, iron deficiency anemia, atrial fibrillation not on anticoagulants, morbid obesity, pacemaker placement, OSA on BiPAP, CKD 4, who presents with shortness of breath and chest pain.  Patient states that she has been having shortness of breath in the past 5 days, which has progressively getting worse.  She also has worsening bilateral leg edema.  She has gained more than 3 pounds recently. The shortness of breath is associated with intermittent chest pain, which is located in the substernal area, pressure-like, 7 out of 10 in severity sometimes, currently 3 out of 10 severity, radiating to the left shoulder.  The chest pain is not pleuritic, not aggravated by deep breath or coughing.  Patient has mild dry cough, but no fever or chills.  No recent long distant traveling.   Per her daughter, pt she had syncopal episode while she was in her motorized wheelchair Sunday, lasted for a few seconds . She probably compressed her left shoulder on the handle of wheelchair, causing bruise in L shoulder/deltoid area.  Patient denies any tingling, numbness or weakness in extremities.  No facial droop, slurred speech.  No vision change or hearing loss.  She has nausea intermittently, but no vomiting, diarrhea or abdominal pain.  Denies symptoms of UTI.   ED Course: pt was found to have BNP 160, negative troponin, WBC 6.3, worsening renal function, temperature normal, heart rate 46-103, oxygen saturation 93% on room air, temperature normal, chest x-ray showed new opacity in the right lower lobe.  Patient is placed on  telemetry bed for observation.  Review of Systems:   General: no fevers, chills, has nody weight gain, has poor appetite, has fatigue HEENT: no blurry vision, hearing changes or sore throat Respiratory: has dyspnea, coughing, no wheezing CV: has chest pain, no palpitations GI: no nausea, vomiting, abdominal pain, diarrhea, constipation GU: no dysuria, burning on urination, increased urinary frequency, hematuria  Ext: has leg edema Neuro: no unilateral weakness, numbness, or tingling, no vision change or hearing loss. Had syncope. Skin: no rash, no skin tear. MSK: No muscle spasm, no deformity, no limitation of range of movement in spin Heme: No easy bruising.  Travel history: No recent long distant travel.  Allergy:  Allergies  Allergen Reactions  . Cephalexin Shortness Of Breath    sob  . Codeine Anaphylaxis    REACTION: throat swelling  . Contrast Media [Iodinated Diagnostic Agents] Other (See Comments)    Patient states "I have chronic kidney disease so the doctor said no dye in my veins."  . Coreg [Carvedilol] Other (See Comments)    Beta Blockers cause her organs to shut down per patient  . Peppermint Flavor Shortness Of Breath  . Prednisone Anaphylaxis, Shortness Of Breath and Swelling    REACTION: swelling, S.O.B.  . Tape Other (See Comments)    States plastic tape blisters her skin  . Ciprofloxacin Hives  . Latex Itching and Rash  . Penicillins Hives    Past Medical History:  Diagnosis Date  . Anemia   . Atrial fibrillation ([place])   . Bursitis   .  Cancer ([place])   . Cataract   . Cervical cancer ([place]) 1991   s/p hysterectomy  . Chronic cellulitis   . Chronic combined systolic and diastolic congestive heart failure, NYHA class 2 (HCC)    LVEF 25-30% with restrictive diastolic filling  . Degenerative joint disease   . Diverticulitis   . Essential hypertension   . Gallstones   . GERD (gastroesophageal reflux disease)   . Gout   . Kidney stones   . Morbid  obesity ([place])   . NICM (nonischemic cardiomyopathy) ([place]) 02/22/2017  . Osteoarthritis   . Permanent atrial fibrillation ([place]) 05/04/2009   Qualifier: History of  By: Quentin Cornwall CMA, Janett Billow    . 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 Sinoatrial node dysfunction [facility])    Status post PPM - Dr. Lovena Le  . Sleep apnea    CPAP    Past Surgical History:  Procedure Laterality Date  . ABDOMINAL HYSTERECTOMY  1991  . COLONOSCOPY  1998   one polyp per patient  . EYE SURGERY    . PACEMAKER INSERTION  Nov 2000   St Jude with revision in 2011  . PERCUTANEOUS NEPHROLITHOTOMY  April 2012    Social History:  reports that she quit smoking about 38 years ago. Her smoking use included cigarettes. She smoked 1.00 pack per day. She has never used smokeless tobacco. She reports that she does not drink alcohol or use drugs.  Family History:  Family History  Problem Relation Age of Onset  . Heart attack Father   . Stroke Mother   . Diabetes Sister   . Colon cancer Other        seven family members  . Liver disease Neg Hx   . Other Neg Hx      Prior to Admission medications   Medication Sig Start Date End Date Taking? Authorizing Provider  allopurinol (ZYLOPRIM) 100 MG tablet Take 100 mg by mouth 2 (two) times daily.    [provider]  aspirin 325 MG tablet Take 325 mg by mouth daily.      [provider]  calcitRIOL (ROCALTROL) 0.25 MCG capsule Take 0.25 mcg by mouth every other day.     [provider]  calcium carbonate (OS-CAL) 600 MG tablet Take 600 mg by mouth daily.    [provider]  COD LIVER OIL PO Take 1 tablet by mouth daily.      [provider]  docusate sodium (COLACE) 100 MG capsule Take 200 mg by mouth daily as needed for mild constipation.     [provider]  feeding supplement (BOOST HIGH PROTEIN) LIQD Take 1 Container by mouth daily.    [provider]  ferrous sulfate 325 (65 FE) MG tablet Take 325 mg by mouth every evening.     [provider]  Glycerin, Adult, 2.1 g SUPP Place 1 suppository rectally daily as needed for moderate constipation. 02/22/17   Barrett, Evelene Croon, PA-C  HYDROcodone-acetaminophen (NORCO) 10-325 MG per tablet Take 1 tablet by mouth 2 (two) times daily.     [provider]  isosorbide mononitrate (IMDUR) 30 MG 24 hr tablet Take 0.5 tablets (15 mg total) by mouth daily. 12/10/17   Fay Records, MD  metolazone (ZAROXOLYN) 2.5 MG tablet Take 1 tablet (2.5 mg total) by mouth as needed. Take once as instructed. 05/30/18   Evans Lance, MD  Multiple  Vitamin (MULTIVITAMIN) tablet Take 1 tablet by mouth daily.    [provider]  nystatin (MYCOSTATIN/NYSTOP) powder Apply 1 application topically every other day. 02/05/17   [provider]  nystatin-triamcinolone ointment (MYCOLOG) Apply topically 2 (two) times daily. 02/22/17   Barrett, Evelene Croon, PA-C  torsemide (DEMADEX) 20 MG tablet TAKE 4 TABLETS (80MG) BY MOUTH IN THE MORNING AND EACH EVENING DAILY. 06/12/18   Bhagat, Crista Luria, PA  vitamin B-12 (CYANOCOBALAMIN) 500 MCG tablet Take 500 mcg by mouth daily.    [provider]    Physical Exam: Vitals:   06/21/18 0030 06/21/18 0100 06/21/18 0130 06/21/18 0141  BP: (!) 141/58   103/74  Pulse: (!) 55 98 91 98  Resp: (!) 21 (!) 28 (!) 22 (!) 23  Temp:      TempSrc:      SpO2: 96% 93% 95% 94%   General: Not in acute distress HEENT:       Eyes: PERRL, EOMI, no scleral icterus.       ENT: No discharge from the ears and nose, no pharynx injection, no tonsillar enlargement.        Neck: Difficult to assess JVD, no bruit, no mass felt. Heme: No neck lymph node enlargement. Cardiac: S1/S2, RRR, No murmurs, No gallops or rubs. Respiratory: No rales, wheezing, rhonchi or rubs. GI: Soft, nondistended, nontender, no rebound pain, no organomegaly, BS present. GU: No  hematuria Ext: 3+ pitting leg edema bilaterally. Has chronic venous insufficiency change. 1+DP/PT pulse bilaterally. Musculoskeletal: No joint deformities, No joint redness or warmth, no limitation of ROM in spin. Skin: No rashes.  Neuro: Alert, oriented X3, cranial nerves II-XII grossly intact, moves all extremities normally. Psych: Patient is not psychotic, no suicidal or hemocidal ideation.  Labs on Admission: I have personally reviewed following labs and imaging studies  CBC: Recent Labs  Lab 06/20/18 2249  WBC 6.3  HGB 11.0*  HCT 34.6*  MCV 98.3  PLT 264*   Basic Metabolic Panel: Recent Labs  Lab 06/20/18 2249  NA 139  K 3.5  CL 92*  CO2 35*  GLUCOSE 123*  BUN 81*  CREATININE 2.72*  CALCIUM 9.8   GFR: CrCl cannot be calculated (Unknown ideal weight.). Liver Function Tests: No results for input(s): AST, ALT, ALKPHOS, BILITOT, PROT, ALBUMIN in the last 168 hours. No results for input(s): LIPASE, AMYLASE in the last 168 hours. No results for input(s): AMMONIA in the last 168 hours. Coagulation Profile: No results for input(s): INR, PROTIME in the last 168 hours. Cardiac Enzymes: No results for input(s): CKTOTAL, CKMB, CKMBINDEX, TROPONINI in the last 168 hours. BNP (last 3 results) No results for input(s): PROBNP in the last 8760 hours. HbA1C: No results for input(s): HGBA1C in the last 72 hours. CBG: No results for input(s): GLUCAP in the last 168 hours. Lipid Profile: No results for input(s): CHOL, HDL, LDLCALC, TRIG, CHOLHDL, LDLDIRECT in the last 72 hours. Thyroid Function Tests: No results for input(s): TSH, T4TOTAL, FREET4, T3FREE, THYROIDAB in the last 72 hours. Anemia Panel: No results for input(s): VITAMINB12, FOLATE, FERRITIN, TIBC, IRON, RETICCTPCT in the last 72 hours. Urine analysis:    Component Value Date/Time   COLORURINE YELLOW 12/01/2016 [place] 12/01/2016 1224   LABSPEC 1.009 12/01/2016 1224   PHURINE 6.0 12/01/2016  1224   GLUCOSEU NEGATIVE 12/01/2016 1224   HGBUR SMALL (A) 12/01/2016 1224   BILIRUBINUR NEGATIVE 12/01/2016 1224   KETONESUR NEGATIVE 12/01/2016 1224   PROTEINUR NEGATIVE 12/01/2016 1224  UROBILINOGEN 0.2 03/06/2013 2035   NITRITE NEGATIVE 12/01/2016 1224   LEUKOCYTESUR LARGE (A) 12/01/2016 1224   Sepsis Labs: @LABRCNTIP (procalcitonin:4,lacticidven:4) )No results found for this or any previous visit (from the past 240 hour(s)).   Radiological Exams on Admission: Dg Chest 2 View  Result Date: 06/20/2018 CLINICAL DATA:  Chest pain and dyspnea worse since Sunday. EXAM: CHEST - 2 VIEW COMPARISON:  01/30/2018 FINDINGS: Obscuration of the right heart border and right hemidiaphragm from pulmonary consolidation and probable superimposed pleural fluid. Enlarged cardiac silhouette. Pulmonary vascular redistribution and chronic interstitial prominence. Moderate aortic atherosclerosis at the arch. Left-sided pacemaker apparatus with right ventricular lead noted. IMPRESSION: New confluent opacities at the right lung base obscuring the right heart border and right hemidiaphragm suspicious for pulmonary consolidation/pneumonia. Superimposed pleural fluid is suspected . Electronically Signed   By: Ashley Royalty M.D.   On: 06/20/2018 23:30     EKG: Independently reviewed.  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  She has bilateral 3+ leg edema and weight gain, consistent with CHF exacerbation.  BNP is 160, which is likely falsely low due to  obesity.  Chest x-ray showed opacity in the right lower lobe, but patient does not have fever or leukocytosis.  Clinically does not seem to have pneumonia.  Doxycycline was ordered in ED, which will be discontinued.  Patient states that she never had fever when she had infection in the past, blood culture was drawn in ED-->will need to be followed up.  -will place on tele bed for obs -Lasix 80 mg bid by IV -trop x 3 -2d echo -Daily weights -strict I/O's -Low salt diet  Chest pain: Likely due to demand ischemia secondary to CHF exacerbation.  Troponin negative.  Patient does not have pleuritic chest pain or signs of DVT, low suspicions for PE. - cycle CE q6 x3 and repeat EKG in the am  - prn Nitroglycerin, norco and aspirin, imdur - Risk factor stratification: will check FLP and A1C  - 2d echo  HTN:  -on IV lasix -IV hydralazine prn  Atrial Fibrillation: CHA2DS2-VASc Score is 4, needs oral anticoagulation, but pt is not on AC, not sure why. Pt is followed by pt Card, Dr. Lovena Le. Heart rate is 44-103 in ED. -tele monitoring -f/u with Dr. 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  Baseline creatinine 1.8-2.0.  She had creatinine 2.64 on 05/25/2017.  Today her creatinine is at 2.72, BUN 81, likely due to cardiorenal syndrome.  Patient is followed up by Dr. Jimmy Footman. - Family wants [place] to consult Dr. Jimmy Footman in am. -f/u renal Fx by BMP  Iron deficiency: hgb stable. 11.0 -f/u by CBC  Syncope: Etiology is not clear.  Happened 5 days ago, no focal neurologic findings.  May be related to CHF.  Denies any head injury, no neck pain or headache.  Patient states that she cannot lay flat to do CT of head, therefore refused CT head. She has PPM, cannot  do MRI-brain. -Observe closely for any new focal neurologic signs or seizures. -Follow-up 2D echo.   DVT ppx: SQ Heparin  Code Status: DNR (I discussed with  patient in the presence of her daughter who is the POA, and explained the meaning of CODE STATUS. Patient wants to be DNR) Family Communication: Yes, patient's daughters at bed side Disposition Plan:  Anticipate discharge back to previous home environment Consults called:  none Admission status: Obs / tele    Date of Service 06/21/2018    Ivor Costa [facility] Pager [phone]  If 7PM-7AM, please contact night-coverage [email or web address] Password [email or web address] 06/21/2018, 1:58 AM

## 2018-06-21 NOTE — Progress Notes (Signed)
[facility] PLAN OF CARE NOTE Patient: [name] [name]:[id number]   PCP: [name], MD DOB: [birth year]   DOA: 06/20/2018   DOS: 06/21/2018    Patient was admitted by my colleague Dr. Blaine Hamper earlier on 06/21/2018. I have reviewed the H&P as well as assessment and plan and agree with the same. Important changes in the plan are listed below.  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    Author: Berle Mull, MD [facility] Pager: [phone] 06/21/2018 4:58 PM   If 7PM-7AM, please contact night-coverage at [email or web address], password [email or web address]

## 2018-06-21 NOTE — Care Management Note (Addendum)
Case Management Note  Patient Details  Name: [name] [name] MRN: [id number] Date of Birth: [birth year]  Subjective/Objective:  From home, uses bipap at night at home, presents with acute/chronic combined sys/diast chf, permanent afib, chest pain, le edema.  On iv lasix, will need pt/ot eval. Plan for dc by Monday.                  Action/Plan: NCM will follow for transition of care needs.  Expected Discharge Date:                  Expected Discharge Plan:  Home/Self Care  In-House Referral:     Discharge planning Services  CM Consult  Post Acute Care Choice:    Choice offered to:     DME Arranged:    DME Agency:     HH Arranged:    HH Agency:     Status of Service:  In process, will continue to follow  If discussed at Long Length of Stay Meetings, dates discussed:    Additional Comments:  Zenon Mayo, RN 06/21/2018, 2:03 PM

## 2018-06-21 NOTE — Progress Notes (Signed)
  Echocardiogram 2D Echocardiogram has been performed.  [name] [name] 06/21/2018, 9:00 AM

## 2018-06-21 NOTE — ED Provider Notes (Signed)
TIME SEEN: 12:04 AM  CHIEF COMPLAINT: Chest pain, shortness of breath  HPI: Patient is a 72 year old female with history of hypertension, obesity, CHF, sinus node dysfunction status post pacemaker, atrial fibrillation who presents to the emergency department with 2 weeks of intermittent chest pressure, shortness of breath.  Shortness of breath worse with lying flat.  Has had a dry nonproductive cough.  No known fever.  Has had increased bilateral lower extremity swelling.  Does not wear oxygen at home.  Has had some nausea.  No dizziness or diaphoresis.  PCP - [facility]  ROS: See HPI Constitutional: no fever  Eyes: no drainage  ENT: no runny nose   Cardiovascular:   chest pain  Resp:  SOB  GI: no vomiting GU: no dysuria Integumentary: no rash  Allergy: no hives  Musculoskeletal: no leg swelling  Neurological: no slurred speech ROS otherwise negative  PAST MEDICAL HISTORY/PAST SURGICAL HISTORY:  Past Medical History:  Diagnosis Date  . Anemia   . Atrial fibrillation ([place])   . Bursitis   . Cancer ([place])   . Cataract   . Cervical cancer ([place]) 1991   s/p hysterectomy  . Chronic cellulitis   . Chronic combined systolic and diastolic congestive heart failure, NYHA class 2 (HCC)    LVEF 25-30% with restrictive diastolic filling  . Degenerative joint disease   . Diverticulitis   . Essential hypertension   . Gallstones   . GERD (gastroesophageal reflux disease)   . Gout   . Kidney stones   . Morbid obesity ([place])   . NICM (nonischemic cardiomyopathy) ([place]) 02/22/2017  . Osteoarthritis   . Permanent atrial fibrillation ([place]) 05/04/2009   Qualifier: History of  By: Quentin Cornwall CMA, Janett Billow    . 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 Sinoatrial node dysfunction [facility])    Status post PPM - Dr. Lovena Le  . Sleep apnea    CPAP    MEDICATIONS:  Prior to Admission medications   Medication Sig Start Date End Date  Taking? Authorizing Provider  allopurinol (ZYLOPRIM) 100 MG tablet Take 100 mg by mouth 2 (two) times daily.    [provider]  aspirin 325 MG tablet Take 325 mg by mouth daily.      [provider]  calcitRIOL (ROCALTROL) 0.25 MCG capsule Take 0.25 mcg by mouth every other day.     [provider]  calcium carbonate (OS-CAL) 600 MG tablet Take 600 mg by mouth daily.    [provider]  COD LIVER OIL PO Take 1 tablet by mouth daily.      [provider]  docusate sodium (COLACE) 100 MG capsule Take 200 mg by mouth daily as needed for mild constipation.     [provider]  feeding supplement (BOOST HIGH PROTEIN) LIQD Take 1 Container by mouth daily.    [provider]  ferrous sulfate 325 (65 FE) MG tablet Take 325 mg by mouth every evening.     [provider]  Glycerin, Adult, 2.1 g SUPP Place 1 suppository rectally daily as needed for moderate constipation. 02/22/17   Barrett, Evelene Croon, PA-C  HYDROcodone-acetaminophen (NORCO) 10-325 MG per tablet Take 1 tablet by mouth 2 (two) times daily.     [provider]  isosorbide mononitrate (IMDUR) 30 MG 24 hr tablet Take 0.5 tablets (15 mg total) by mouth daily. 12/10/17   Fay Records, MD  metolazone (ZAROXOLYN)  2.5 MG tablet Take 1 tablet (2.5 mg total) by mouth as needed. Take once as instructed. 05/30/18   Evans Lance, MD  Multiple Vitamin (MULTIVITAMIN) tablet Take 1 tablet by mouth daily.    [provider]  nystatin (MYCOSTATIN/NYSTOP) powder Apply 1 application topically every other day. 02/05/17   [provider]  nystatin-triamcinolone ointment (MYCOLOG) Apply topically 2 (two) times daily. 02/22/17   Barrett, Evelene Croon, PA-C  torsemide (DEMADEX) 20 MG tablet TAKE 4 TABLETS (80MG) BY MOUTH IN THE MORNING AND EACH EVENING DAILY. 06/12/18   Bhagat, Crista Luria, PA  vitamin B-12 (CYANOCOBALAMIN) 500 MCG tablet Take 500 mcg by mouth daily.    [provider]    ALLERGIES:  Allergies  Allergen Reactions  . Cephalexin Shortness Of Breath    sob  . Codeine Anaphylaxis    REACTION: throat swelling  . Contrast Media [Iodinated Diagnostic Agents] Other (See Comments)    Patient states "I have chronic kidney disease so the doctor said no dye in my veins."  . Coreg [Carvedilol] Other (See Comments)    Beta Blockers cause her organs to shut down per patient  . Peppermint Flavor Shortness Of Breath  . Prednisone Anaphylaxis, Shortness Of Breath and Swelling    REACTION: swelling, S.O.B.  . Tape Other (See Comments)    States plastic tape blisters her skin  . Ciprofloxacin Hives  . Latex Itching and Rash  . Penicillins Hives    SOCIAL HISTORY:  Social History   Tobacco Use  . Smoking status: Former Smoker    Packs/day: 1.00    Types: Cigarettes    Last attempt to quit: 09/26/1979    Years since quitting: 38.7  . Smokeless tobacco: Never Used  Substance Use Topics  . Alcohol use: No    FAMILY HISTORY: Family History  Problem Relation Age of Onset  . Heart attack Father   . Stroke Mother   . Diabetes Sister   . Colon cancer Other        seven family members  . Liver disease Neg Hx   . Other Neg Hx     EXAM: BP (!) 142/74 (BP Location: Right Arm)   Pulse (!) 103   Temp 98.2 F (36.8 C) (Oral)   Resp 20   SpO2 98%  CONSTITUTIONAL: Alert and oriented and responds appropriately to questions.  Chronically ill-appearing.  Obese. HEAD: Normocephalic EYES: Conjunctivae clear, pupils appear equal, EOMI ENT: normal nose; moist mucous membranes NECK: Supple, no meningismus, no nuchal rigidity, no LAD  CARD: RRR; S1 and S2 appreciated; no murmurs, no clicks, no rubs, no gallops RESP: Normal chest excursion without splinting or tachypnea; breath sounds clear and equal bilaterally; no wheezes, no rhonchi, no rales, no hypoxia or respiratory distress, speaking full sentences ABD/GI: Normal bowel sounds; non-distended;  soft, non-tender, no rebound, no guarding, no peritoneal signs, no hepatosplenomegaly BACK:  The back appears normal and is non-tender to palpation, there is no CVA tenderness EXT: Normal ROM in all joints; non-tender to palpation; bilateral nonpitting edema in her lower extremities; normal capillary refill; no cyanosis, no calf tenderness or swelling    SKIN: Normal color for age and race; warm; no rash NEURO: Moves all extremities equally PSYCH: The patient's mood and manner are appropriate. Grooming and personal hygiene are appropriate.  MEDICAL DECISION MAKING: Patient here with chest pain shortness of breath.  Does appear volume overloaded on exam.  Chest x-ray concerning for pneumonia versus CHF.  Will give doxycycline  for community-acquired pneumonia and also give IV Lasix.  Troponin is negative.  EKG shows paced rhythm.  Have recommended admission.  Will give aspirin and nitroglycerin for continued chest pain.  Family comfortable with plan for admission.  ED PROGRESS:   12:54 AM Discussed patient's case with hospitalist, Dr. Blaine Hamper.  I have recommended admission and patient (and family if present) agree with this plan. Admitting physician will place admission orders.   I reviewed all nursing notes, vitals, pertinent previous records, EKGs, lab and urine results, imaging (as available).      EKG Interpretation  Date/Time:  Friday June 21 2018 00:17:41 EDT Ventricular Rate:  94 PR Interval:    QRS Duration: 186 QT Interval:  456 QTC Calculation: 571 R Axis:   -86 Text Interpretation:  Ventricular paced rhtyhm No significant change since last tracing Confirmed by Ward, Cyril Mourning [phone]) on 06/21/2018 12:37:19 AM           Ward, Delice Bison, DO 06/21/18 0104

## 2018-06-21 NOTE — ED Notes (Signed)
ED Provider at bedside. 

## 2018-06-22 ENCOUNTER — Encounter: Payer: Self-pay | Admitting: Physician Assistant

## 2018-06-22 DIAGNOSIS — X58XXXA Exposure to other specified factors, initial encounter: Secondary | ICD-10-CM | POA: Diagnosis present

## 2018-06-22 DIAGNOSIS — R0602 Shortness of breath: Secondary | ICD-10-CM | POA: Diagnosis not present

## 2018-06-22 DIAGNOSIS — Z6841 Body Mass Index (BMI) 40.0 and over, adult: Secondary | ICD-10-CM | POA: Diagnosis not present

## 2018-06-22 DIAGNOSIS — I4821 Permanent atrial fibrillation: Secondary | ICD-10-CM | POA: Diagnosis not present

## 2018-06-22 DIAGNOSIS — Z90710 Acquired absence of both cervix and uterus: Secondary | ICD-10-CM | POA: Diagnosis not present

## 2018-06-22 DIAGNOSIS — Z885 Allergy status to narcotic agent status: Secondary | ICD-10-CM | POA: Diagnosis not present

## 2018-06-22 DIAGNOSIS — I340 Nonrheumatic mitral (valve) insufficiency: Secondary | ICD-10-CM | POA: Diagnosis not present

## 2018-06-22 DIAGNOSIS — I482 Chronic atrial fibrillation: Secondary | ICD-10-CM

## 2018-06-22 DIAGNOSIS — I130 Hypertensive heart and chronic kidney disease with heart failure and stage 1 through stage 4 chronic kidney disease, or unspecified chronic kidney disease: Secondary | ICD-10-CM | POA: Diagnosis not present

## 2018-06-22 DIAGNOSIS — I5043 Acute on chronic combined systolic (congestive) and diastolic (congestive) heart failure: Secondary | ICD-10-CM | POA: Diagnosis not present

## 2018-06-22 DIAGNOSIS — L97929 Non-pressure chronic ulcer of unspecified part of left lower leg with unspecified severity: Secondary | ICD-10-CM | POA: Diagnosis not present

## 2018-06-22 DIAGNOSIS — I509 Heart failure, unspecified: Secondary | ICD-10-CM | POA: Diagnosis not present

## 2018-06-22 DIAGNOSIS — I10 Essential (primary) hypertension: Secondary | ICD-10-CM | POA: Diagnosis not present

## 2018-06-22 DIAGNOSIS — R079 Chest pain, unspecified: Secondary | ICD-10-CM | POA: Diagnosis not present

## 2018-06-22 DIAGNOSIS — I428 Other cardiomyopathies: Secondary | ICD-10-CM | POA: Diagnosis present

## 2018-06-22 DIAGNOSIS — G4733 Obstructive sleep apnea (adult) (pediatric): Secondary | ICD-10-CM | POA: Diagnosis not present

## 2018-06-22 DIAGNOSIS — M109 Gout, unspecified: Secondary | ICD-10-CM | POA: Diagnosis present

## 2018-06-22 DIAGNOSIS — Z880 Allergy status to penicillin: Secondary | ICD-10-CM | POA: Diagnosis not present

## 2018-06-22 DIAGNOSIS — N184 Chronic kidney disease, stage 4 (severe): Secondary | ICD-10-CM | POA: Diagnosis not present

## 2018-06-22 DIAGNOSIS — I081 Rheumatic disorders of both mitral and tricuspid valves: Secondary | ICD-10-CM | POA: Diagnosis not present

## 2018-06-22 DIAGNOSIS — I110 Hypertensive heart disease with heart failure: Secondary | ICD-10-CM | POA: Diagnosis not present

## 2018-06-22 DIAGNOSIS — S99922A Unspecified injury of left foot, initial encounter: Secondary | ICD-10-CM | POA: Diagnosis not present

## 2018-06-22 DIAGNOSIS — Z91040 Latex allergy status: Secondary | ICD-10-CM | POA: Diagnosis not present

## 2018-06-22 DIAGNOSIS — Z888 Allergy status to other drugs, medicaments and biological substances status: Secondary | ICD-10-CM | POA: Diagnosis not present

## 2018-06-22 DIAGNOSIS — I5042 Chronic combined systolic (congestive) and diastolic (congestive) heart failure: Secondary | ICD-10-CM | POA: Diagnosis not present

## 2018-06-22 DIAGNOSIS — K219 Gastro-esophageal reflux disease without esophagitis: Secondary | ICD-10-CM | POA: Diagnosis present

## 2018-06-22 DIAGNOSIS — K7460 Unspecified cirrhosis of liver: Secondary | ICD-10-CM | POA: Diagnosis present

## 2018-06-22 DIAGNOSIS — D509 Iron deficiency anemia, unspecified: Secondary | ICD-10-CM | POA: Diagnosis present

## 2018-06-22 DIAGNOSIS — D631 Anemia in chronic kidney disease: Secondary | ICD-10-CM | POA: Diagnosis not present

## 2018-06-22 DIAGNOSIS — Z950 Presence of cardiac pacemaker: Secondary | ICD-10-CM | POA: Diagnosis not present

## 2018-06-22 DIAGNOSIS — J9811 Atelectasis: Secondary | ICD-10-CM | POA: Diagnosis not present

## 2018-06-22 DIAGNOSIS — Z66 Do not resuscitate: Secondary | ICD-10-CM | POA: Diagnosis not present

## 2018-06-22 DIAGNOSIS — I472 Ventricular tachycardia: Secondary | ICD-10-CM | POA: Diagnosis not present

## 2018-06-22 LAB — BASIC METABOLIC PANEL
ANION GAP: 11 (ref 5–15)
BUN: 79 mg/dL — ABNORMAL HIGH (ref 8–23)
CHLORIDE: 93 mmol/L — AB (ref 98–111)
CO2: 35 mmol/L — AB (ref 22–32)
Calcium: 9.4 mg/dL (ref 8.9–10.3)
Creatinine, Ser: 2.56 mg/dL — ABNORMAL HIGH (ref 0.44–1.00)
GFR calc Af Amer: 20 mL/min — ABNORMAL LOW (ref >60)
GFR, EST NON AFRICAN AMERICAN: 17 mL/min — AB (ref >60)
GLUCOSE: 95 mg/dL (ref 70–99)
POTASSIUM: 3.5 mmol/L (ref 3.5–5.1)
Sodium: 139 mmol/L (ref 135–145)

## 2018-06-22 LAB — CBC
HEMATOCRIT: 31.1 % — AB (ref 36.0–46.0)
HEMOGLOBIN: 10 g/dL — AB (ref 12.0–15.0)
MCH: 31.3 pg (ref 26.0–34.0)
MCHC: 32.2 g/dL (ref 30.0–36.0)
MCV: 97.5 fL (ref 78.0–100.0)
Platelets: 107 10*3/uL — ABNORMAL LOW (ref 150–400)
RBC: 3.19 MIL/uL — ABNORMAL LOW (ref 3.87–5.11)
RDW: 14.7 % (ref 11.5–15.5)
WBC: 5 10*3/uL (ref 4.0–10.5)

## 2018-06-22 LAB — MAGNESIUM: Magnesium: 2.7 mg/dL — ABNORMAL HIGH (ref 1.7–2.4)

## 2018-06-22 MED ORDER — BACITRACIN-NEOMYCIN-POLYMYXIN 400-5-5000 EX OINT
TOPICAL_OINTMENT | Freq: Every day | CUTANEOUS | Status: DC
  Administered 2018-06-22: 22:00:00 via TOPICAL
  Administered 2018-06-23: 1 via TOPICAL
  Administered 2018-06-25: 09:00:00 via TOPICAL
  Administered 2018-06-26: 1 via TOPICAL
  Administered 2018-06-27 – 2018-06-30 (×3): via TOPICAL
  Filled 2018-06-22: qty 1

## 2018-06-22 MED ORDER — ENSURE ENLIVE PO LIQD
237.0000 mL | Freq: Two times a day (BID) | ORAL | Status: DC
  Administered 2018-06-23: 237 mL via ORAL

## 2018-06-22 NOTE — Plan of Care (Signed)
Patient is alert and oriented, use o2 therapy, bed alarm set, pm meds given, iv therapy for difficult stick, w/c at bedside, gylcerin supp., given for constipation, purewick used, continuous pulse ox, tele monitoring, monitor Vs, labs, I&Os, will continue to monitor Problem: Education: Goal: Knowledge of General Education information will improve Description Including pain rating scale, medication(s)/side effects and non-pharmacologic comfort measures Outcome: Progressing   Problem: Health Behavior/Discharge Planning: Goal: Ability to manage health-related needs will improve Outcome: Progressing   Problem: Clinical Measurements: Goal: Ability to maintain clinical measurements within normal limits will improve Outcome: Progressing Goal: Will remain free from infection Outcome: Progressing Goal: Diagnostic test results will improve Outcome: Progressing Goal: Respiratory complications will improve Outcome: Progressing Goal: Cardiovascular complication will be avoided Outcome: Progressing   Problem: Activity: Goal: Risk for activity intolerance will decrease Outcome: Progressing   Problem: Nutrition: Goal: Adequate nutrition will be maintained Outcome: Progressing   Problem: Coping: Goal: Level of anxiety will decrease Outcome: Progressing   Problem: Elimination: Goal: Will not experience complications related to bowel motility Outcome: Progressing Goal: Will not experience complications related to urinary retention Outcome: Progressing   Problem: Pain Managment: Goal: General experience of comfort will improve Outcome: Progressing   Problem: Safety: Goal: Ability to remain free from injury will improve Outcome: Progressing   Problem: Skin Integrity: Goal: Risk for impaired skin integrity will decrease Outcome: Progressing

## 2018-06-22 NOTE — Consult Note (Signed)
Referring Provider: No ref. provider found Primary Care Physician:  [name], MD Primary Nephrologist:  Dr. Jimmy Footman  Reason for Consultation: Chronic renal insufficiency stage IV, congestive heart failure ejection fraction 61% systolic dysfunction, hypertension, atrial fibrillation, anemia, increasing lower extremity edema.  HPI: This is a very pleasant 72 year old lady who is a history of atrial fibrillation status post AV nodal ablation and pacemaker implantation chronic lower extremity edema with venous insufficiency using an Unna boot bilaterally.  She has cirrhosis secondary to [place].  Chronic kidney disease stage IV baseline creatinine 2.5-3 has been followed by Dr.Deterding.  Has severe mitral regurgitation on 2D echo.  And symptomatic heart failure.  Has been evaluated by cardiology due to a 5-day history of increasing dyspnea and lower extremity edema elevated BNP to 160 mildly elevated troponins that are flat.  Chest x-ray showing opacities in the right lung base obscuring the right heart border right hemidiaphragm suspicious for pulmonary consolidation/pneumonia superimposed pleural fluid is suspected.  She is being treated with 80 mg IV twice daily of Lasix.  Weight on admission was increased about 4 pounds to 261 pounds she has had about 2 to 3 L urine output over the last 24 hours.  She has an elevated chads score of 4 but is refused anticoagulation.  She has a history of severe left ventricular enlargement secondary to mitral regurgitation.  Pressure 129/76 pulse 102 temperature 97.9 O2 sats 98% on nasal cannula 2 L.  Urine output 1.4 L 06/22/2008   sodium 139 potassium 3.9 chloride 93 CO2 35 glucose 95 BUN 79 creatinine 3.56 calcium 9.4 magnesium 2.7 estimated GFR about 20 cc/min.  WBC 5.0 hemoglobin 10.0 platelets 107         Past Medical History:  Diagnosis Date  . Anemia   . Atrial fibrillation ([place])   . Bursitis   . Cancer ([place])   . Cataract   . Cervical cancer  ([place]) 1991   s/p hysterectomy  . Chronic cellulitis   . Chronic combined systolic and diastolic congestive heart failure, NYHA class 2 (H[facility])    LVEF 25-30% with restrictive diastolic filling  . Degenerative joint disease   . Diverticulitis   . Essential hypertension   . Gallstones   . GERD (gastroesophageal reflux disease)   . Gout   . Kidney stones   . Morbid obesity ([place])   . NICM (nonischemic cardiomyopathy) ([place]) 02/22/2017  . Osteoarthritis   . Permanent atrial fibrillation ([place]) 05/04/2009   Qualifier: History of  By: Quentin Cornwall CMA, Janett Billow    . 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   Qualifier: Diagnosis of  By: Lovena Le, MD, [facility], Binnie Kand   . Sinoatrial node dysfunction [facility])    Status post PPM - Dr. Lovena Le  . Sleep apnea    CPAP    Past Surgical History:  Procedure Laterality Date  . ABDOMINAL HYSTERECTOMY  1991  . COLONOSCOPY  1998   one polyp per patient  . EYE SURGERY    . PACEMAKER INSERTION  Nov 2000   [employer] with revision in 2011  . PERCUTANEOUS NEPHROLITHOTOMY  April 2012    Prior to Admission medications   Medication Sig Start Date End Date Taking? Authorizing Provider  allopurinol (ZYLOPRIM) 100 MG tablet Take 100 mg by mouth 2 (two) times daily.   Yes [provider]  aspirin 325 MG tablet Take 325 mg by mouth every evening.    Yes [provider]  calcitRIOL (ROCALTROL) 0.25 MCG capsule Take 0.25 mcg  by mouth every other day.    Yes [provider]  calcium carbonate (OS-CAL) 600 MG tablet Take 600 mg by mouth daily.   Yes [provider]  COD LIVER OIL PO Take 1 tablet by mouth daily.     Yes [provider]  docusate sodium (COLACE) 100 MG capsule Take 200 mg by mouth daily as needed for mild constipation.    Yes [provider]  feeding supplement (BOOST HIGH PROTEIN) LIQD Take 1 Container by mouth daily.   Yes [provider]  ferrous sulfate 325 (65 FE) MG tablet Take 325 mg  by mouth every evening.    Yes [provider]  Glycerin, Adult, 2.1 g SUPP Place 1 suppository rectally daily as needed for moderate constipation. 02/22/17  Yes Barrett, Evelene Croon, PA-C  HYDROcodone-acetaminophen (NORCO) 10-325 MG per tablet Take 1 tablet by mouth 3 (three) times daily.    Yes [provider]  isosorbide mononitrate (IMDUR) 30 MG 24 hr tablet Take 0.5 tablets (15 mg total) by mouth daily. 12/10/17  Yes Fay Records, MD  metolazone (ZAROXOLYN) 2.5 MG tablet Take 1 tablet (2.5 mg total) by mouth as needed. Take once as instructed. Patient taking differently: Take 2.5 mg by mouth daily as needed (fluid). Take once as instructed.  05/30/18  Yes Evans Lance, MD  Multiple Vitamin (MULTIVITAMIN) tablet Take 1 tablet by mouth daily.   Yes [provider]  nystatin (MYCOSTATIN/NYSTOP) powder Apply 1 application topically daily.  02/05/17  Yes [provider]  torsemide (DEMADEX) 20 MG tablet TAKE 4 TABLETS (80MG) BY MOUTH IN THE MORNING AND EACH EVENING DAILY. 06/12/18  Yes Bhagat, Bhavinkumar, PA  vitamin B-12 (CYANOCOBALAMIN) 500 MCG tablet Take 500 mcg by mouth daily.   Yes [provider]  white petrolatum (VASELINE) GEL Apply 1 application topically daily as needed for dry skin.   Yes [provider]  nystatin-triamcinolone ointment (MYCOLOG) Apply topically 2 (two) times daily. Patient not taking: Reported on 06/21/2018 02/22/17   Barrett, Evelene Croon, PA-C    Current Facility-Administered Medications  Medication Dose Route Frequency Provider Last Rate Last Dose  . 0.9 %  sodium chloride infusion  250 mL Intravenous PRN Ivor Costa, MD      . acetaminophen (TYLENOL) tablet 650 mg  650 mg Oral Q4H PRN Ivor Costa, MD      . allopurinol (ZYLOPRIM) tablet 100 mg  100 mg Oral BID Ivor Costa, MD   100 mg at 06/22/18 0850  . aspirin tablet 325 mg  325 mg Oral QPM Ivor Costa, MD   325 mg at 06/22/18 1541  . calcitRIOL (ROCALTROL) capsule 0.25  mcg  0.25 mcg Oral Henreitta Cea, MD   0.25 mcg at 06/22/18 0850  . calcium carbonate (OS-CAL - dosed in mg of elemental calcium) tablet 1,250 mg  1,250 mg Oral Q lunch Ivor Costa, MD   1,250 mg at 06/22/18 1220  . docusate sodium (COLACE) capsule 200 mg  200 mg Oral Daily PRN Ivor Costa, MD      . feeding supplement (ENSURE ENLIVE) (ENSURE ENLIVE) liquid 237 mL  237 mL Oral BID BM Lavina Hamman, MD      . ferrous sulfate tablet 325 mg  325 mg Oral QPM Ivor Costa, MD   325 mg at 06/22/18 1541  . furosemide (LASIX) injection 80 mg  80 mg Intravenous BID Ivor Costa, MD   80 mg at 06/22/18 1541  . Glycerin (Adult)  2.1 g suppository 1 suppository  1 suppository Rectal Daily PRN Ivor Costa, MD   1 suppository at 06/22/18 0401  . heparin injection 5,000 Units  5,000 Units Subcutaneous Q8H Ivor Costa, MD      . hydrALAZINE (APRESOLINE) injection 5 mg  5 mg Intravenous Q2H PRN Ivor Costa, MD      . HYDROcodone-acetaminophen [facility]) 10-325 MG per tablet 1 tablet  1 tablet Oral TID Ivor Costa, MD   1 tablet at 06/22/18 1541  . HYDROcodone-acetaminophen (NORCO/VICODIN) 5-325 MG per tablet 1-2 tablet  1-2 tablet Oral Q6H PRN Vertis Kelch, NP   1 tablet at 06/21/18 0404  . hydrOXYzine (ATARAX/VISTARIL) tablet 10 mg  10 mg Oral TID PRN Ivor Costa, MD      . isosorbide mononitrate (IMDUR) 24 hr tablet 15 mg  15 mg Oral Daily Ivor Costa, MD   15 mg at 06/22/18 0851  . multivitamin with minerals tablet 1 tablet  1 tablet Oral Daily Ivor Costa, MD   1 tablet at 06/22/18 0850  . neomycin-bacitracin-polymyxin (NEOSPORIN) ointment   Topical Daily Lavina Hamman, MD      . nitroGLYCERIN (NITROSTAT) SL tablet 0.4 mg  0.4 mg Sublingual Q5 min PRN Ivor Costa, MD      . nystatin (MYCOSTATIN/NYSTOP) topical powder   Topical Daily Ivor Costa, MD      . potassium chloride SA (K-DUR,KLOR-CON) CR tablet 20 mEq  20 mEq Oral BID Lavina Hamman, MD   20 mEq at 06/22/18 0850  . sodium chloride flush (NS) 0.9 %  injection 3 mL  3 mL Intravenous Q12H Ivor Costa, MD   3 mL at 06/22/18 0858  . sodium chloride flush (NS) 0.9 % injection 3 mL  3 mL Intravenous PRN Ivor Costa, MD      . vitamin B-12 (CYANOCOBALAMIN) tablet 500 mcg  500 mcg Oral Daily Ivor Costa, MD   500 mcg at 06/22/18 4496  . zolpidem (AMBIEN) tablet 5 mg  5 mg Oral QHS PRN Ivor Costa, MD   5 mg at 06/21/18 2121    Allergies as of 06/20/2018 - Review Complete 06/20/2018  Allergen Reaction Noted  . Cephalexin Shortness Of Breath 06/13/2016  . Codeine Anaphylaxis   . Contrast media [iodinated diagnostic agents] Other (See Comments) 10/30/2014  . Coreg [carvedilol] Other (See Comments) 11/12/2014  . Peppermint flavor Shortness Of Breath 11/21/2016  . Prednisone Anaphylaxis, Shortness Of Breath, and Swelling   . Tape Other (See Comments) 03/06/2013  . Ciprofloxacin Hives   . Latex Itching and Rash   . Penicillins Hives     Family History  Problem Relation Age of Onset  . Heart attack Father   . Stroke Mother   . Diabetes Sister   . Colon cancer Other        seven family members  . Liver disease Neg Hx   . Other Neg Hx     Social History   Socioeconomic History  . Marital status: Widowed    Spouse name: Not on file  . Number of children: Not on file  . Years of education: Not on file  . Highest education level: Not on file  Occupational History  . Not on file  Social Needs  . Financial resource strain: Not on file  . Food insecurity:    Worry: Not on file    Inability: Not on file  . Transportation needs:    Medical: Not on file    Non-medical: Not on  file  Tobacco Use  . Smoking status: Former Smoker    Packs/day: 1.00    Types: Cigarettes    Last attempt to quit: 09/26/1979    Years since quitting: 38.7  . Smokeless tobacco: Never Used  Substance and Sexual Activity  . Alcohol use: No  . Drug use: No  . Sexual activity: Never  Lifestyle  . Physical activity:    Days per week: Not on file    Minutes per  session: Not on file  . Stress: Not on file  Relationships  . Social connections:    Talks on phone: Not on file    Gets together: Not on file    Attends religious service: Not on file    Active member of club or organization: Not on file    Attends meetings of clubs or organizations: Not on file    Relationship status: Not on file  . Intimate partner violence:    Fear of current or ex partner: Not on file    Emotionally abused: Not on file    Physically abused: Not on file    Forced sexual activity: Not on file  Other Topics Concern  . Not on file  Social History Narrative  . Not on file    Review of Systems: Gen: Denies any fever, chills, sweats, positive for weakness fatigue malaise weight gain due to increased edema  HEENT: No visual complaints, No history of Retinopathy. Normal external appearance No Epistaxis or Sore throat. No sinusitis.   CV: Denies chest pain, history of mitral regurgitation seen by Dr. Pernell Dupre for cardiology Resp: Dyspnea to minimal exertion uses motorized wheelchair due to mechanical obesity as well as severe heart failure GI: Denies vomiting blood, jaundice, and fecal incontinence.   Denies dysphagia or odynophagia. GU : Denies urinary burning, blood in urine, urinary frequency, urinary hesitancy, nocturnal urination, and urinary incontinence.  No renal calculi. MS: Unna boot to lower extremities due to peripheral edema Derm: Denies rash, itching, dry skin, hives, moles, warts, or unhealing ulcers.  Psych: Denies depression, anxiety, memory loss, suicidal ideation, hallucinations, paranoia, and confusion. Heme: Denies bruising, bleeding, and enlarged lymph nodes. Neuro: No headache.  No diplopia. No dysarthria.  No dysphasia.  No history of CVA.  No Seizures. No paresthesias.  No weakness. Endocrine No DM.  No Thyroid disease.  No Adrenal disease.  Physical Exam: Vital signs in last 24 hours: Temp:  [97.5 F (36.4 C)-97.9 F (36.6 C)] 97.9 F  (36.6 C) (09/28 1650) Pulse Rate:  [91-102] 102 (09/28 1650) Resp:  [15-20] 19 (09/28 1650) BP: (98-129)/(56-76) 129/76 (09/28 1650) SpO2:  [95 %-99 %] 98 % (09/28 1650) Last BM Date: 06/21/18 General:   Obese pale ill-appearing lady using oxygen through nasal cannula Head:  Normocephalic and atraumatic. Eyes:  Sclera clear, no icterus.   Conjunctiva pink. Ears:  Normal auditory acuity. Nose:  No deformity, discharge,  or lesions cannula. Mouth:  No deformity or lesions, dentition normal. Neck:  Supple; no masses or thyromegaly. JVP mildly elevated Lungs: Diminished breath sounds at bases right-sided diminished with rales greater than left Heart: Holosystolic murmur distant heart sounds poorly defined S1-S2 Abdomen: Obese nontender no organosplenomegaly palpable Msk:  Symmetrical without gross deformities. Normal posture. Extremities   she had Unna boots with edema noted in thighs Neurologic:  Alert and  oriented x4;  grossly normal neurologically. Skin:  Intact without significant lesions or rashes.   Intake/Output from previous day: 09/27 0701 - 09/28 0700 In:  720 [P.O.:720] Out: 1750 [Urine:1750] Intake/Output this shift: Total I/O In: 120 [P.O.:120] Out: 601 [Urine:601]  Lab Results: Recent Labs    06/20/18 2249 06/22/18 0059  WBC 6.3 5.0  HGB 11.0* 10.0*  HCT 34.6* 31.1*  PLT 121* 107*   BMET Recent Labs    06/21/18 0821 06/21/18 1552 06/22/18 0059  NA 138 140 139  K 3.1* 3.8 3.5  CL 92* 93* 93*  CO2 32 30 35*  GLUCOSE 114* 80 95  BUN 82* 85* 79*  CREATININE 2.70* 2.44* 2.56*  CALCIUM 9.5 9.9 9.4   LFT No results for input(s): PROT, ALBUMIN, AST, ALT, ALKPHOS, BILITOT, BILIDIR, IBILI in the last 72 hours. PT/INR No results for input(s): LABPROT, INR in the last 72 hours. Hepatitis Panel No results for input(s): HEPBSAG, HCVAB, HEPAIGM, HEPBIGM in the last 72 hours.  Studies/Results: Dg Chest 2 View  Result Date: 06/20/2018 CLINICAL DATA:  Chest  pain and dyspnea worse since Sunday. EXAM: CHEST - 2 VIEW COMPARISON:  01/30/2018 FINDINGS: Obscuration of the right heart border and right hemidiaphragm from pulmonary consolidation and probable superimposed pleural fluid. Enlarged cardiac silhouette. Pulmonary vascular redistribution and chronic interstitial prominence. Moderate aortic atherosclerosis at the arch. Left-sided pacemaker apparatus with right ventricular lead noted. IMPRESSION: New confluent opacities at the right lung base obscuring the right heart border and right hemidiaphragm suspicious for pulmonary consolidation/pneumonia. Superimposed pleural fluid is suspected . Electronically Signed   By: Ashley Royalty M.D.   On: 06/20/2018 23:30    Assessment/Plan:  Chronic kidney disease stage IV baseline creatinine 2.5-3.0.  She appears to be pretty stable as far as her renal function is concerned she is trying to avoid dialysis has been followed very closely at [facility].  There is no use of nonsteroidal anti-inflammatory drugs, no ACE inhibitors or ARB's there has been no administration of IV contrast.  Appears to be admitted with decompensated congestive heart failure with increasing dyspnea and increasing lower extremity edema.  Continues on Demadex 80 mg twice daily and as needed metolazone.  Lasix has been given intravenously 80 mg IV every 12 hours and she has had a good diuretic response  Hypertension/volume appears to be fairly well controlled at this present time continue IV diuretics  Anemia stable no ESA necessary at this time continues on oral ferrous sulfate 325 mg daily  Bones calcitriol 0.25 mcg daily continue to follow calcium and phosphorus she uses calcium carbonate once daily  Mitral regurgitation severe followed by cardiology symptomatic management may be candidate for MitraClip with  Congestive heart failure ejection fraction 25% followed by cardiology  History of atrial fibrillation has refused  anticoagulation  Obstructive sleep apnea continues on BiPAP at home   LOS: 0 Sherril Croon @TODAY @5 :32 PM

## 2018-06-22 NOTE — Consult Note (Addendum)
The patient has been seen in conjunction with Charlotta Newton, NP. All aspects of care have been considered and discussed. The patient has been personally interviewed, examined, and all clinical data has been reviewed.   Morbidly obese relatively immobile female appearing older than stated age of 72.  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  Neck veins are markedly elevated with the patient lying at 30 degrees.  The liver and spleen are not palpable.  Abdomen is markedly obese.  Lower extremities are wrapped.  3/6 to 4/6 holosystolic apical and left axillary systolic murmur.  Also left parasternal and subxiphoid systolic murmur compatible with tricuspid regurgitation.  Reviewed echocardiogram.  The patient has significant mitral and tricuspid regurgitation.  Mitral valve annulus has significant calcification.  EF is 40%.  LV end-systolic dimension is 50 mm.  IMPRESSION: Decompensated biventricular failure secondary to severe chronic combined systolic and diastolic heart failure as well as mitral regurgitation.  Right heart failure is also evident by clinical exam and echo with RA enlargement.  Not a candidate for surgical mitral valve repair due to comorbidities, age, etc.  Therapy is medical with diuresis to remove volume as you are doing.  Should probably have Dr. Sherren Mocha review echo to determine if MitraClip has any role in her care.     Cardiology Consultation:   Patient ID: [name] MRN: [id number]; DOB: 05-09-1946  Admit date: 06/20/2018 Date of Consult: 06/22/2018  Primary Care Provider: [name], MD Primary Cardiologist: Dorris Carnes, MD  Primary Electrophysiologist:  Cristopher Peru, MD   Patient Profile:   [name] [name] is a 72 y.o. 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  who is being seen today for the evaluation of severe MR and acute on chronic heart failure at the request of Dr. Posey Pronto.  History of Present Illness:   [name] is followed for general cardiology by Dr. Harrington Challenger and for electrophysiology by Dr. Lovena Le.  She was last seen in the office on 05/30/2018 and noted to have class III heart failure symptoms.  She was continued on torsemide 80 mg twice daily with as needed metolazone.  Her weight was stable at the time.  She is noted to be obese and very sedentary.   She presented to the hospital on 06/20/2018 with a 5-day history of progressively worsening shortness of breath and worsening bilateral leg edema.  She had gained more than 3 pounds.  She also had some intermittent chest discomfort radiating to the left shoulder.  Her daughter reported that she had a syncopal episode while she was in her motorized wheelchair on Sunday that lasted for a few seconds.  The patient tells me that she has had a couple weeks of progressive shortness of breath and increased lower extremity edema along with a constant sharp chest pain that goes through to her left scapular region.  She has been compliant with her torsemide 80 mg twice daily and took an as needed dose of metolazone on Tuesday which she says did not help significantly.   Currently the patient says that she is not breathing much better.  Her legs are now wrapped with Unna boots and she has some wrinkling of the skin  of her toes which she says has improved from the profound edema that she had on admission.  Past Medical History:  Diagnosis Date  . Anemia   . Atrial fibrillation ([place])   . Bursitis   . Cancer ([place])   . Cataract   . Cervical cancer ([place]) 1991   s/p hysterectomy  . Chronic cellulitis   . Chronic combined  systolic and diastolic congestive heart failure, NYHA class 2 ([facility])    LVEF 25-30% with restrictive diastolic filling  . Degenerative joint disease   . Diverticulitis   . Essential hypertension   . Gallstones   . GERD (gastroesophageal reflux disease)   . Gout   . Kidney stones   . Morbid obesity ([place])   . NICM (nonischemic cardiomyopathy) ([place]) 02/22/2017  . Osteoarthritis   . Permanent atrial fibrillation ([place]) 05/04/2009   Qualifier: History of  By: Quentin Cornwall CMA, Janett Billow    . 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   Qualifier: Diagnosis of  By: Lovena Le, MD, [facility], Binnie Kand   . Sinoatrial node dysfunction [facility])    Status post PPM - Dr. Lovena Le  . Sleep apnea    CPAP    Past Surgical History:  Procedure Laterality Date  . ABDOMINAL HYSTERECTOMY  1991  . COLONOSCOPY  1998   one polyp per patient  . EYE SURGERY    . PACEMAKER INSERTION  Nov 2000   [employer] with revision in 2011  . PERCUTANEOUS NEPHROLITHOTOMY  April 2012     Home Medications:  Prior to Admission medications   Medication Sig Start Date End Date Taking? Authorizing Provider  allopurinol (ZYLOPRIM) 100 MG tablet Take 100 mg by mouth 2 (two) times daily.   Yes [provider]  aspirin 325 MG tablet Take 325 mg by mouth every evening.    Yes [provider]  calcitRIOL (ROCALTROL) 0.25 MCG capsule Take 0.25 mcg by mouth every other day.    Yes [provider]  calcium carbonate (OS-CAL) 600 MG tablet Take 600 mg by mouth daily.   Yes [provider]  COD LIVER OIL PO Take 1 tablet by mouth daily.     Yes [provider]  docusate sodium (COLACE) 100 MG capsule Take 200 mg by mouth daily as needed for mild constipation.    Yes [provider]  feeding supplement (BOOST HIGH PROTEIN) LIQD Take 1 Container by mouth daily.   Yes [provider]  ferrous sulfate 325 (65 FE) MG tablet Take 325 mg by mouth every evening.    Yes [provider]    Glycerin, Adult, 2.1 g SUPP Place 1 suppository rectally daily as needed for moderate constipation. 02/22/17  Yes Barrett, Evelene Croon, PA-C  HYDROcodone-acetaminophen (NORCO) 10-325 MG per tablet Take 1 tablet by mouth 3 (three) times daily.    Yes [provider]  isosorbide mononitrate (IMDUR) 30 MG 24 hr tablet Take 0.5 tablets (15 mg total) by mouth daily. 12/10/17  Yes Fay Records, MD  metolazone (ZAROXOLYN) 2.5 MG tablet Take 1 tablet (2.5 mg total) by mouth as needed. Take once as instructed. Patient taking differently: Take 2.5 mg by mouth daily as needed (fluid). Take once as instructed.  05/30/18  Yes Evans Lance, MD  Multiple Vitamin (MULTIVITAMIN) tablet Take 1 tablet by mouth daily.   Yes [provider]  nystatin (MYCOSTATIN/NYSTOP) powder Apply 1 application topically daily.  02/05/17  Yes [provider]  torsemide (DEMADEX) 20 MG tablet TAKE  4 TABLETS (80MG) BY MOUTH IN THE MORNING AND EACH EVENING DAILY. 06/12/18  Yes Bhagat, Bhavinkumar, PA  vitamin B-12 (CYANOCOBALAMIN) 500 MCG tablet Take 500 mcg by mouth daily.   Yes [provider]  white petrolatum (VASELINE) GEL Apply 1 application topically daily as needed for dry skin.   Yes [provider]  nystatin-triamcinolone ointment (MYCOLOG) Apply topically 2 (two) times daily. Patient not taking: Reported on 06/21/2018 02/22/17   Barrett, Evelene Croon, PA-C    Inpatient Medications: Scheduled Meds: . allopurinol  100 mg Oral BID  . aspirin  325 mg Oral QPM  . calcitRIOL  0.25 mcg Oral QODAY  . calcium carbonate  1,250 mg Oral Q lunch  . feeding supplement (ENSURE ENLIVE)  237 mL Oral BID BM  . ferrous sulfate  325 mg Oral QPM  . furosemide  80 mg Intravenous BID  . heparin  5,000 Units Subcutaneous Q8H  . HYDROcodone-acetaminophen  1 tablet Oral TID  . isosorbide mononitrate  15 mg Oral Daily  . multivitamin with minerals  1 tablet Oral Daily  . neomycin-bacitracin-polymyxin    Topical Daily  . nystatin   Topical Daily  . potassium chloride  20 mEq Oral BID  . sodium chloride flush  3 mL Intravenous Q12H  . vitamin B-12  500 mcg Oral Daily   Continuous Infusions: . sodium chloride     PRN Meds: sodium chloride, acetaminophen, docusate sodium, Glycerin (Adult), hydrALAZINE, HYDROcodone-acetaminophen, hydrOXYzine, nitroGLYCERIN, sodium chloride flush, zolpidem  Allergies:    Allergies  Allergen Reactions  . Cephalexin Shortness Of Breath    sob  . Codeine Anaphylaxis    REACTION: throat swelling  . Contrast Media [Iodinated Diagnostic Agents] Other (See Comments)    Patient states "I have chronic kidney disease so the doctor said no dye in my veins."  . Coreg [Carvedilol] Other (See Comments)    Beta Blockers cause her organs to shut down per patient  . Peppermint Flavor Shortness Of Breath  . Prednisone Anaphylaxis, Shortness Of Breath and Swelling    REACTION: swelling, S.O.B.  . Tape Other (See Comments)    States plastic tape blisters her skin  . Ciprofloxacin Hives  . Latex Itching and Rash  . Penicillins Hives    Social History:   Social History   Socioeconomic History  . Marital status: Widowed    Spouse name: Not on file  . Number of children: Not on file  . Years of education: Not on file  . Highest education level: Not on file  Occupational History  . Not on file  Social Needs  . Financial resource strain: Not on file  . Food insecurity:    Worry: Not on file    Inability: Not on file  . Transportation needs:    Medical: Not on file    Non-medical: Not on file  Tobacco Use  . Smoking status: Former Smoker    Packs/day: 1.00    Types: Cigarettes    Last attempt to quit: 09/26/1979    Years since quitting: 38.7  . Smokeless tobacco: Never Used  Substance and Sexual Activity  . Alcohol use: No  . Drug use: No  . Sexual activity: Never  Lifestyle  . Physical activity:    Days per week: Not on file    Minutes per session:  Not on file  . Stress: Not on file  Relationships  . Social connections:    Talks on phone: Not on file    Gets  together: Not on file    Attends religious service: Not on file    Active member of club or organization: Not on file    Attends meetings of clubs or organizations: Not on file    Relationship status: Not on file  . Intimate partner violence:    Fear of current or ex partner: Not on file    Emotionally abused: Not on file    Physically abused: Not on file    Forced sexual activity: Not on file  Other Topics Concern  . Not on file  Social History Narrative  . Not on file    Family History:    Family History  Problem Relation Age of Onset  . Heart attack Father   . Stroke Mother   . Diabetes Sister   . Colon cancer Other        seven family members  . Liver disease Neg Hx   . Other Neg Hx      ROS:  Please see the history of present illness.   All other ROS reviewed and negative.     Physical Exam/Data:   Vitals:   06/21/18 1700 06/21/18 1810 06/21/18 2336 06/22/18 0722  BP: 101/71 (!) 107/56 127/69 (!) 98/58  Pulse: 99 97 99 91  Resp: (!) 22 20 15 18   Temp: (!) 97.5 F (36.4 C) 97.7 F (36.5 C) (!) 97.5 F (36.4 C) 97.9 F (36.6 C)  TempSrc: Oral Oral Oral Oral  SpO2: 98% 99% 95% 97%  Weight:      Height:        Intake/Output Summary (Last 24 hours) at 06/22/2018 1557 Last data filed at 06/22/2018 1224 Gross per 24 hour  Intake 360 ml  Output 1751 ml  Net -1391 ml   Filed Weights   06/21/18 0225  Weight: 118.7 kg   Body mass index is 42.24 kg/m.  General:  Well nourished, well developed, obese in no acute distress HEENT: normal Lymph: no adenopathy Neck: no JVD Endocrine:  No thryomegaly Vascular: No carotid bruits; FA pulses 2+ bilaterally without bruits  Cardiac:  normal S1, S2; RRR; 3/6 apical murmur Lungs:  clear to auscultation bilaterally, no wheezing, rhonchi or rales  Abd: soft, nontender, no hepatomegaly  Ext: lower leg  edema, wrapped in Unna boots.  Musculoskeletal:  No deformities Skin: warm and dry  Neuro:  CNs 2-12 intact, no focal abnormalities noted Psych:  Normal affect   EKG:  The EKG was personally reviewed and demonstrates: Ventricular pacing Telemetry:  Telemetry was personally reviewed and demonstrates:  V pacing in the 90's  Relevant CV Studies:  Echocardiogram 06/21/2018 Study Conclusions - Left ventricle: The cavity size was severely dilated. Wall   thickness was increased in a pattern of mild LVH. Systolic   function was mildly to moderately reduced. The estimated ejection   fraction was in the range of 40% to 45%. There is akinesis of the   mid-apicalanteroseptal and apical myocardium. The study is not   technically sufficient to allow evaluation of LV diastolic   function. - Aortic valve: Valve mobility was restricted. There was mild   stenosis. - Mitral valve: Calcified annulus. There was severe regurgitation. - Left atrium: The atrium was severely dilated. - Right atrium: The atrium was severely dilated. - Tricuspid valve: There was moderate regurgitation. - Pulmonary arteries: Systolic pressure was mildly increased. PA   peak pressure: 43 mm Hg (S).  Impressions: - Akinesis of the distal septum and apex; overall mild to  moderate   LV dysfunction; severe LVE; mild AS (mean gradient 14 mmHg);   severe MR; severe biatrial enlargement; moderate TR with mild   pulmonary hypertension.  Echo 11/22/2016: EF 35%, hypokinesis of the mid- apical anteroseptal and apical myocardium, mild left ear, mild MR, severely dilated left atrium  Laboratory Data:  Chemistry Recent Labs  Lab 06/21/18 0821 06/21/18 1552 06/22/18 0059  NA 138 140 139  K 3.1* 3.8 3.5  CL 92* 93* 93*  CO2 32 30 35*  GLUCOSE 114* 80 95  BUN 82* 85* 79*  CREATININE 2.70* 2.44* 2.56*  CALCIUM 9.5 9.9 9.4  GFRNONAA 16* 19* 17*  GFRAA 19* 21* 20*  ANIONGAP 14 17* 11    No results for input(s): PROT,  ALBUMIN, AST, ALT, ALKPHOS, BILITOT in the last 168 hours. Hematology Recent Labs  Lab 06/20/18 2249 06/22/18 0059  WBC 6.3 5.0  RBC 3.52* 3.19*  HGB 11.0* 10.0*  HCT 34.6* 31.1*  MCV 98.3 97.5  MCH 31.3 31.3  MCHC 31.8 32.2  RDW 14.8 14.7  PLT 121* 107*   Cardiac Enzymes Recent Labs  Lab 06/21/18 0138 06/21/18 0717 06/21/18 1312  TROPONINI 0.04* 0.05* 0.05*    Recent Labs  Lab 06/20/18 2328  TROPIPOC 0.00    BNP Recent Labs  Lab 06/20/18 2249  BNP 160.6*    DDimer No results for input(s): DDIMER in the last 168 hours.  Radiology/Studies:  Dg Chest 2 View  Result Date: 06/20/2018 CLINICAL DATA:  Chest pain and dyspnea worse since Sunday. EXAM: CHEST - 2 VIEW COMPARISON:  01/30/2018 FINDINGS: Obscuration of the right heart border and right hemidiaphragm from pulmonary consolidation and probable superimposed pleural fluid. Enlarged cardiac silhouette. Pulmonary vascular redistribution and chronic interstitial prominence. Moderate aortic atherosclerosis at the arch. Left-sided pacemaker apparatus with right ventricular lead noted. IMPRESSION: New confluent opacities at the right lung base obscuring the right heart border and right hemidiaphragm suspicious for pulmonary consolidation/pneumonia. Superimposed pleural fluid is suspected . Electronically Signed   By: Ashley Royalty M.D.   On: 06/20/2018 23:30    Assessment and Plan:    Acute on chronic combined systolic and diastolic heart failure -LVEF 35% in Feb 2018 echo.  Echo done yesterday showed EF 40-45%. -Per last visit on 05/30/2018 her symptoms were class III.  She was continued on diuretic therapy with torsemide 80 mg twice daily and as needed metolazone. -Presented with a 5-day history of increasing shortness of breath and lower extremity edema. -BNP was 160 -Troponins very mildly elevated and flat pattern, 0.04, 0.05. -Chest x-ray showed: New confluent opacities at the right lung base obscuring the right heart  border and right hemidiaphragm suspicious for pulmonary consolidation/pneumonia. Superimposed pleural fluid is suspected  -She has been started on Lasix 80 mg IV twice daily, has now received 4 doses -No new weight for today available in epic.  Weight on admission was 261 pounds.  Weight at last office visit on 05/30/2018 was 257 pounds. -She has had 2.3 L urine output since yesterday with a net -1.5 L fluid balance. -Continue current diuresis. -Strict intake and output -Daily weights -Low-sodium diet  Permanent atrial fibrillation -Rate controlled on no rate controlling medications. -Patient has pacemaker due to heart block after AVN ablation -Her CHADS2-VASc=4 (HTN, CHF, 72 yo, female), but she has refused anticoagulation.  She is on aspirin 325 mg daily.  Severe MR -MR severe by echo done yesterday.  Prior echo in February showed mild MR. She has severe LV  enlargement -Message sent to Dr. Roma Schanz by primary team for future evaluation of MR.  CKD stage IV -Serum creatinine was a little elevated from last month on admission at 2.72.  2.56 today. -Continue to monitor -is followed by nephrology as outpatient, has been consulted.   Obesity:  -Body mass index is 42.24 kg/m.  -Wt loss would help her quality of life.      For questions or updates, please contact [place] Please consult [email or web address] for contact info under     Signed, Daune Perch, NP  06/22/2018 3:57 PM

## 2018-06-22 NOTE — Progress Notes (Addendum)
[facility] Progress Note  Patient: [name] [name] RWE:[id number]   PCP: [name], MD DOB: Aug 07, 1946   DOA: 06/20/2018   DOS: 06/22/2018   Date of Service: the patient was seen and examined on 06/22/2018  Brief hospital course: Pt. with PMH of CHF, chronic systolic, EF 61%, HTN, GERD, IDA, A. fib not on anticoagulation, morbid obesity, S/P PPM, OSA on BiPAP, CKD stage IV; admitted on 06/20/2018, presented with complaint of shortness of breath, was found to have acute on chronic systolic CHF. Currently further plan is continue IV Lasix.  Subjective: Feeling somewhat better but still gets short of breath even trying to come out of the bed to the chair.  No nausea no vomiting no fever no chills.  Complains about pain in her left great toe which she had injured a week ago.  Assessment and Plan: 1.  Acute on chronic combined systolic and diastolic CHF. Patient with cough and shortness of breath. Also had weight gain. BNP was 160.  But in patient with CKD as well as obesity the value is not reliable. Patient was more hypoxic than her baseline. Started on IV Lasix 80 mg twice daily with significant improvement in her breathing and continues to feel better. Still does not with her baseline. Still swelling in the leg. Unna boot placed Continue with IV Lasix for today.  Reevaluate tomorrow.  PT OT consulted. Family requested cardiology consult.  Cardiology consulted appreciate their assistance.  2.  Severe mitral regurgitation. May benefit from mitral clip. Patient will follow-up with cardiology outpatient.  3.  Chronic kidney disease. Stage IV. Renal function stable. Family requested nephrology consult. Consulted.  4.  Left great toe injury. Topical ointment.  5.  Obstructive sleep apnea. Continue BiPAP per home regimen.  6.  Concern for pneumonia. Family has concern for pneumonia based on the chest x-ray on admission. With normal white count no leukocytosis and no fever  I do not suspect that the patient actually has pneumonia. Alternative explanation is CHF exacerbation.  Diet: Cardiac diet DVT Prophylaxis: subcutaneous Heparin  Advance goals of care discussion: DNR/DNI  Family Communication: family was present at bedside, at the time of interview. The pt provided permission to discuss medical plan with the family. Opportunity was given to ask question and all questions were answered satisfactorily.   Disposition:  Discharge to home on Monday.  Consultants: Cardiology and nephrology at family request Procedures: Echocardiogram   Scheduled Meds: . allopurinol  100 mg Oral BID  . aspirin  325 mg Oral QPM  . calcitRIOL  0.25 mcg Oral QODAY  . calcium carbonate  1,250 mg Oral Q lunch  . feeding supplement (ENSURE ENLIVE)  237 mL Oral BID BM  . ferrous sulfate  325 mg Oral QPM  . furosemide  80 mg Intravenous BID  . heparin  5,000 Units Subcutaneous Q8H  . HYDROcodone-acetaminophen  1 tablet Oral TID  . isosorbide mononitrate  15 mg Oral Daily  . multivitamin with minerals  1 tablet Oral Daily  . nystatin   Topical Daily  . potassium chloride  20 mEq Oral BID  . sodium chloride flush  3 mL Intravenous Q12H  . vitamin B-12  500 mcg Oral Daily   Continuous Infusions: . sodium chloride     PRN Meds: sodium chloride, acetaminophen, docusate sodium, Glycerin (Adult), hydrALAZINE, HYDROcodone-acetaminophen, hydrOXYzine, nitroGLYCERIN, sodium chloride flush, zolpidem Antibiotics: Anti-infectives (From admission, onward)   Start     Dose/Rate Route Frequency Ordered Stop   06/21/18 0100  doxycycline (VIBRAMYCIN) 100 mg in sodium chloride 0.9 % 250 mL IVPB  Status:  Discontinued     100 mg 125 mL/hr over 120 Minutes Intravenous  Once 06/21/18 0048 06/21/18 0119       Objective: Physical Exam: Vitals:   06/21/18 1700 06/21/18 1810 06/21/18 2336 06/22/18 0722  BP: 101/71 (!) 107/56 127/69 (!) 98/58  Pulse: 99 97 99 91  Resp: (!) 22 20 15 18     Temp: (!) 97.5 F (36.4 C) 97.7 F (36.5 C) (!) 97.5 F (36.4 C) 97.9 F (36.6 C)  TempSrc: Oral Oral Oral Oral  SpO2: 98% 99% 95% 97%  Weight:      Height:        Intake/Output Summary (Last 24 hours) at 06/22/2018 1435 Last data filed at 06/22/2018 1224 Gross per 24 hour  Intake 600 ml  Output 1751 ml  Net -1151 ml   Filed Weights   06/21/18 0225  Weight: 118.7 kg   General: Alert, Awake and Oriented to Time, Place and Person. Appear in mild distress, affect appropriate Eyes: PERRL, Conjunctiva normal ENT: Oral Mucosa clear moist. Neck: difficult to assess  JVD, no Abnormal Mass Or lumps Cardiovascular: S1 and S2 Present, aortic systolic  Murmur, Peripheral Pulses Present Respiratory: normal respiratory effort, Bilateral Air entry equal and Decreased, no use of accessory muscle, basal crackles, no wheezes Abdomen: Bowel Sound present, Soft and no tenderness, no hernia Skin: no redness, no Rash, no induration Extremities: bilateral  Pedal edema, no calf tenderness Neurologic: Grossly no focal neuro deficit. Bilaterally Equal motor strength  Data Reviewed: CBC: Recent Labs  Lab 06/20/18 2249 06/22/18 0059  WBC 6.3 5.0  HGB 11.0* 10.0*  HCT 34.6* 31.1*  MCV 98.3 97.5  PLT 121* 163*   Basic Metabolic Panel: Recent Labs  Lab 06/20/18 2249 06/21/18 0821 06/21/18 1552 06/22/18 0059  NA 139 138 140 139  K 3.5 3.1* 3.8 3.5  CL 92* 92* 93* 93*  CO2 35* 32 30 35*  GLUCOSE 123* 114* 80 95  BUN 81* 82* 85* 79*  CREATININE 2.72* 2.70* 2.44* 2.56*  CALCIUM 9.8 9.5 9.9 9.4  MG  --   --  2.7* 2.7*    Liver Function Tests: No results for input(s): AST, ALT, ALKPHOS, BILITOT, PROT, ALBUMIN in the last 168 hours. No results for input(s): LIPASE, AMYLASE in the last 168 hours. No results for input(s): AMMONIA in the last 168 hours. Coagulation Profile: No results for input(s): INR, PROTIME in the last 168 hours. Cardiac Enzymes: Recent Labs  Lab 06/21/18 0138  06/21/18 0717 06/21/18 1312  TROPONINI 0.04* 0.05* 0.05*   BNP (last 3 results) No results for input(s): PROBNP in the last 8760 hours. CBG: No results for input(s): GLUCAP in the last 168 hours. Studies: No results found.   Time spent: 35 minutes Severity of Illness: The appropriate patient status for this patient is INPATIENT. Inpatient status is judged to be reasonable and necessary in order to provide the required intensity of service to ensure the patient's safety. The patient's presenting symptoms, physical exam findings, and initial radiographic and laboratory data in the context of their chronic comorbidities is felt to place them at high risk for further clinical deterioration. Furthermore, it is not anticipated that the patient will be medically stable for discharge from the hospital within 2 midnights of admission. The following factors support the patient status of inpatient.   " The patient's presenting symptoms include minimal improvement in shortnes of breath and  edema. " The worrisome physical exam findings include bilateral pitting edema and hypoxia. " The initial radiographic and laboratory data are worrisome because of elevated serum creatinine. " The chronic co-morbidities include CKD stage IV, OSA, thrombocytopenia which is acute, severe mitral regurgitation with low EF.   * I certify that at the point of admission it is my clinical judgment that the patient will require inpatient hospital care spanning beyond 2 midnights from the point of admission due to high intensity of service, high risk for further deterioration and high frequency of surveillance required.*   Author: Berle Mull, MD [facility] Pager: [phone] 06/22/2018 2:35 PM  If 7PM-7AM, please contact night-coverage at [email or web address], password [facility]

## 2018-06-22 NOTE — Progress Notes (Signed)
Orthopedic Tech Progress Note Patient Details:  [name] [name] Dec 30, 1945 [id number]  Ortho Devices Type of Ortho Device: Louretta Parma boot Ortho Device/Splint Location: bilateral Ortho Device/Splint Interventions: Application   Post Interventions Patient Tolerated: Well Instructions Provided: Care of device   Hildred Priest 06/22/2018, 11:21 AM

## 2018-06-23 DIAGNOSIS — Z950 Presence of cardiac pacemaker: Secondary | ICD-10-CM

## 2018-06-23 LAB — CBC
HCT: 34.6 % — ABNORMAL LOW (ref 36.0–46.0)
HEMOGLOBIN: 11.1 g/dL — AB (ref 12.0–15.0)
MCH: 31.7 pg (ref 26.0–34.0)
MCHC: 32.1 g/dL (ref 30.0–36.0)
MCV: 98.9 fL (ref 78.0–100.0)
Platelets: 98 10*3/uL — ABNORMAL LOW (ref 150–400)
RBC: 3.5 MIL/uL — ABNORMAL LOW (ref 3.87–5.11)
RDW: 14.9 % (ref 11.5–15.5)
WBC: 5.8 10*3/uL (ref 4.0–10.5)

## 2018-06-23 LAB — FOLATE: FOLATE: 44 ng/mL (ref >5.9)

## 2018-06-23 LAB — BASIC METABOLIC PANEL
ANION GAP: 13 (ref 5–15)
BUN: 76 mg/dL — ABNORMAL HIGH (ref 8–23)
CO2: 31 mmol/L (ref 22–32)
Calcium: 9.7 mg/dL (ref 8.9–10.3)
Chloride: 95 mmol/L — ABNORMAL LOW (ref 98–111)
Creatinine, Ser: 2.48 mg/dL — ABNORMAL HIGH (ref 0.44–1.00)
GFR calc Af Amer: 21 mL/min — ABNORMAL LOW (ref >60)
GFR, EST NON AFRICAN AMERICAN: 18 mL/min — AB (ref >60)
Glucose, Bld: 100 mg/dL — ABNORMAL HIGH (ref 70–99)
Potassium: 3.8 mmol/L (ref 3.5–5.1)
SODIUM: 139 mmol/L (ref 135–145)

## 2018-06-23 LAB — RETICULOCYTES
RBC.: 3.38 MIL/uL — ABNORMAL LOW (ref 3.87–5.11)
RETIC CT PCT: 1.5 % (ref 0.4–3.1)
Retic Count, Absolute: 50.7 10*3/uL (ref 19.0–186.0)

## 2018-06-23 LAB — CBC WITH DIFFERENTIAL/PLATELET
ABS IMMATURE GRANULOCYTES: 0 10*3/uL (ref 0.0–0.1)
BASOS ABS: 0 10*3/uL (ref 0.0–0.1)
Basophils Relative: 1 %
Eosinophils Absolute: 0.2 10*3/uL (ref 0.0–0.7)
Eosinophils Relative: 5 %
HCT: 33.3 % — ABNORMAL LOW (ref 36.0–46.0)
HEMOGLOBIN: 10.7 g/dL — AB (ref 12.0–15.0)
Immature Granulocytes: 0 %
LYMPHS PCT: 19 %
Lymphs Abs: 0.9 10*3/uL (ref 0.7–4.0)
MCH: 31.7 pg (ref 26.0–34.0)
MCHC: 32.1 g/dL (ref 30.0–36.0)
MCV: 98.5 fL (ref 78.0–100.0)
Monocytes Absolute: 0.4 10*3/uL (ref 0.1–1.0)
Monocytes Relative: 8 %
NEUTROS ABS: 3.3 10*3/uL (ref 1.7–7.7)
Neutrophils Relative %: 67 %
Platelets: 111 10*3/uL — ABNORMAL LOW (ref 150–400)
RBC: 3.38 MIL/uL — AB (ref 3.87–5.11)
RDW: 15 % (ref 11.5–15.5)
WBC: 4.9 10*3/uL (ref 4.0–10.5)

## 2018-06-23 LAB — TSH: TSH: 4.265 u[IU]/mL (ref 0.350–4.500)

## 2018-06-23 LAB — PROTIME-INR
INR: 1.22
Prothrombin Time: 15.3 seconds — ABNORMAL HIGH (ref 11.4–15.2)

## 2018-06-23 LAB — TYPE AND SCREEN
ABO/RH(D): O POS
ANTIBODY SCREEN: NEGATIVE

## 2018-06-23 LAB — TECHNOLOGIST SMEAR REVIEW

## 2018-06-23 LAB — T4, FREE: FREE T4: 1.14 ng/dL (ref 0.82–1.77)

## 2018-06-23 LAB — FIBRINOGEN: Fibrinogen: 279 mg/dL (ref 210–475)

## 2018-06-23 LAB — VITAMIN B12: VITAMIN B 12: 4151 pg/mL — AB (ref 180–914)

## 2018-06-23 MED ORDER — BOOST PLUS PO LIQD
237.0000 mL | Freq: Three times a day (TID) | ORAL | Status: DC
  Administered 2018-06-23 – 2018-07-02 (×19): 237 mL via ORAL
  Filled 2018-06-23 (×31): qty 237

## 2018-06-23 MED ORDER — POLYETHYLENE GLYCOL 3350 17 G PO PACK
17.0000 g | PACK | ORAL | Status: DC
  Administered 2018-06-23 – 2018-06-29 (×4): 17 g via ORAL
  Filled 2018-06-23 (×6): qty 1

## 2018-06-23 MED ORDER — BOOST / RESOURCE BREEZE PO LIQD CUSTOM: 1.0000 | Freq: Three times a day (TID) | ORAL | Status: DC

## 2018-06-23 NOTE — Progress Notes (Signed)
[facility] ROUNDING NOTE   Subjective:   Appears to have an increase in shortness of breath she is using BiPAP.  Urine output 2 L 06/22/2018 weight 118.7 blood pressure 115/65 pulse 90-100 temperature 97.9 O2 sats 96% BiPAP  Sodium 139 potassium 3.8 chloride 95 CO2 31 glucose 100 BUN 76 creatinine 2.48 calcium 9.7 WBC 4.9 hemoglobin 10.7 l platelets 111 lactate level 0.7 cholesterol 114 troponin  0.05 flat  Chest x-ray new opacity right lung base obscuring right heart border and right hemidiaphragm suspicious for consolidation versus pneumonia   Objective:  Vital signs in last 24 hours:  Temp:  [97.9 F (36.6 C)] 97.9 F (36.6 C) (09/29 0740) Pulse Rate:  [97-102] 97 (09/29 0740) Resp:  [19-20] 20 (09/29 0740) BP: (115-129)/(65-76) 115/65 (09/29 0740) SpO2:  [96 %-98 %] 96 % (09/29 0740)  Weight change:  Filed Weights   06/21/18 0225  Weight: 118.7 kg    Intake/Output: I/O last 3 completed shifts: In: 360 [P.O.:360] Out: 2251 [Urine:2251]   Intake/Output this shift:  No intake/output data recorded.  CVS- RRR holosystolic murmur  RS- CTA patient on BiPAP diminished breath sounds left and right bases ABD- BS present soft non-distended EXT-Unna boot with edema   Basic Metabolic Panel: Recent Labs  Lab 06/20/18 2249 06/21/18 0821 06/21/18 1552 06/22/18 0059 06/23/18 0247  NA 139 138 140 139 139  K 3.5 3.1* 3.8 3.5 3.8  CL 92* 92* 93* 93* 95*  CO2 35* 32 30 35* 31  GLUCOSE 123* 114* 80 95 100*  BUN 81* 82* 85* 79* 76*  CREATININE 2.72* 2.70* 2.44* 2.56* 2.48*  CALCIUM 9.8 9.5 9.9 9.4 9.7  MG  --   --  2.7* 2.7*  --     Liver Function Tests: No results for input(s): AST, ALT, ALKPHOS, BILITOT, PROT, ALBUMIN in the last 168 hours. No results for input(s): LIPASE, AMYLASE in the last 168 hours. No results for input(s): AMMONIA in the last 168 hours.  CBC: Recent Labs  Lab 06/20/18 2249 06/22/18 0059 06/23/18 0247 06/23/18 0748  WBC 6.3  5.0 5.8 4.9  NEUTROABS  --   --   --  3.3  HGB 11.0* 10.0* 11.1* 10.7*  HCT 34.6* 31.1* 34.6* 33.3*  MCV 98.3 97.5 98.9 98.5  PLT 121* 107* 98* 111*    Cardiac Enzymes: Recent Labs  Lab 06/21/18 0138 06/21/18 0717 06/21/18 1312  TROPONINI 0.04* 0.05* 0.05*    BNP: Invalid input(s): POCBNP  CBG: No results for input(s): GLUCAP in the last 168 hours.  Microbiology: Results for orders placed or performed during the hospital encounter of 06/20/18  Blood culture (routine x 2)     Status: None (Preliminary result)   Collection Time: 06/21/18 12:49 AM  Result Value Ref Range Status   Specimen Description BLOOD LEFT WRIST  Final   Special Requests   Final    BOTTLES DRAWN AEROBIC AND ANAEROBIC Blood Culture results may not be optimal due to an excessive volume of blood received in culture bottles   Culture   Final    NO GROWTH 1 DAY Performed at [facility], [place] [address], [place], [place] [address]    Report Status PENDING  Incomplete  Blood culture (routine x 2)     Status: None (Preliminary result)   Collection Time: 06/21/18 12:57 AM  Result Value Ref Range Status   Specimen Description BLOOD RIGHT WRIST  Final   Special Requests   Final  BOTTLES DRAWN AEROBIC AND ANAEROBIC Blood Culture results may not be optimal due to an excessive volume of blood received in culture bottles   Culture   Final    NO GROWTH 1 DAY Performed at [facility] [address], [place], [place] [address]    Report Status PENDING  Incomplete    Coagulation Studies: Recent Labs    06/23/18 0748  LABPROT 15.3*  INR 1.22    Urinalysis: No results for input(s): COLORURINE, LABSPEC, PHURINE, GLUCOSEU, HGBUR, BILIRUBINUR, KETONESUR, PROTEINUR, UROBILINOGEN, NITRITE, LEUKOCYTESUR in the last 72 hours.  Invalid input(s): APPERANCEUR    Imaging: No results found.   Medications:   . sodium chloride     . allopurinol  100 mg Oral BID  . aspirin  325 mg Oral QPM  .  calcitRIOL  0.25 mcg Oral QODAY  . calcium carbonate  1,250 mg Oral Q lunch  . feeding supplement  1 Container Oral TID BM  . ferrous sulfate  325 mg Oral QPM  . furosemide  80 mg Intravenous BID  . HYDROcodone-acetaminophen  1 tablet Oral TID  . isosorbide mononitrate  15 mg Oral Daily  . lactose free nutrition  237 mL Oral TID WC  . multivitamin with minerals  1 tablet Oral Daily  . neomycin-bacitracin-polymyxin   Topical Daily  . nystatin   Topical Daily  . polyethylene glycol  17 g Oral QODAY  . potassium chloride  20 mEq Oral BID  . sodium chloride flush  3 mL Intravenous Q12H  . vitamin B-12  500 mcg Oral Daily   sodium chloride, acetaminophen, docusate sodium, Glycerin (Adult), hydrALAZINE, HYDROcodone-acetaminophen, hydrOXYzine, nitroGLYCERIN, sodium chloride flush, zolpidem  Assessment/ Plan:   Chronic renal insufficiency stage IV baseline creatinine 2.5-3.0 appears to be pretty stable renal function is stable.  There is no use nonsteroidal inflammatories Cox 2 inhibitors of ACE inhibitors, no IV contrast administration.  Decompensated congestive heart failure Lasix has been given intravenously 80 mg IV every 12 hours with excellent diuretic response.  Home medications include Demadex 80 mg twice daily and metolazone as needed  Hypertension/volume appears adequately controlled with IV diuretic therapy  Anemia and stable no ESA at this time continues oral ferrous sulfate  Bones Calcitrol 0.25 mcg daily she uses calcium carbonate once daily  Mitral regurgitation followed by cardiology according to Dr. Tamala Julian she may be a candidate for mitraClip  Congestive heart failure EF 25%  History of atrial fibrillation is refused anticoagulation  Obstructive sleep apnea continues on BiPAP   LOS: [place] @TODAY @12 :04 PM

## 2018-06-23 NOTE — Progress Notes (Signed)
Progress Note  Patient Name: [name] [name] Date of Encounter: 06/23/2018  Primary Cardiologist: Dorris Carnes, MD   Subjective   Concerned that diuresis is not occurring rapidly enough.  Discussed that her relatively low systolic pressure is causing [place] to be less aggressive.  Inpatient Medications    Scheduled Meds: . allopurinol  100 mg Oral BID  . aspirin  325 mg Oral QPM  . calcitRIOL  0.25 mcg Oral QODAY  . calcium carbonate  1,250 mg Oral Q lunch  . feeding supplement  1 Container Oral TID BM  . ferrous sulfate  325 mg Oral QPM  . furosemide  80 mg Intravenous BID  . HYDROcodone-acetaminophen  1 tablet Oral TID  . isosorbide mononitrate  15 mg Oral Daily  . lactose free nutrition  237 mL Oral TID WC  . multivitamin with minerals  1 tablet Oral Daily  . neomycin-bacitracin-polymyxin   Topical Daily  . nystatin   Topical Daily  . polyethylene glycol  17 g Oral QODAY  . potassium chloride  20 mEq Oral BID  . sodium chloride flush  3 mL Intravenous Q12H  . vitamin B-12  500 mcg Oral Daily   Continuous Infusions: . sodium chloride     PRN Meds: sodium chloride, acetaminophen, docusate sodium, Glycerin (Adult), hydrALAZINE, HYDROcodone-acetaminophen, hydrOXYzine, nitroGLYCERIN, sodium chloride flush, zolpidem   Vital Signs    Vitals:   06/21/18 2336 06/22/18 0722 06/22/18 1650 06/23/18 0740  BP: 127/69 (!) 98/58 129/76 115/65  Pulse: 99 91 (!) 102 97  Resp: 15 18 19 20   Temp: (!) 97.5 F (36.4 C) 97.9 F (36.6 C) 97.9 F (36.6 C) 97.9 F (36.6 C)  TempSrc: Oral Oral Oral Oral  SpO2: 95% 97% 98% 96%  Weight:      Height:        Intake/Output Summary (Last 24 hours) at 06/23/2018 1334 Last data filed at 06/22/2018 1730 Gross per 24 hour  Intake -  Output 350 ml  Net -350 ml   Net negative since admission 2.9 l liters to this point  [employer]   06/21/18 0225  Weight: 118.7 kg    Telemetry    Atrial fibrillation with controlled rate- Personally  Reviewed  ECG    No new tracing- Personally Reviewed  Physical Exam  Morbid obesity but lying flat. GEN: No acute distress.   Neck: No JVD Cardiac: IIRR, no murmurs, rubs, or gallops.  Respiratory: Clear to auscultation bilaterally. GI: Soft, nontender, non-distended  MS: No edema; No deformity. Neuro:  Nonfocal  Psych: Normal affect   Labs    Chemistry Recent Labs  Lab 06/21/18 1552 06/22/18 0059 06/23/18 0247  NA 140 139 139  K 3.8 3.5 3.8  CL 93* 93* 95*  CO2 30 35* 31  GLUCOSE 80 95 100*  BUN 85* 79* 76*  CREATININE 2.44* 2.56* 2.48*  CALCIUM 9.9 9.4 9.7  GFRNONAA 19* 17* 18*  GFRAA 21* 20* 21*  ANIONGAP 17* 11 13     Hematology Recent Labs  Lab 06/22/18 0059 06/23/18 0247 06/23/18 0748  WBC 5.0 5.8 4.9  RBC 3.19* 3.50* 3.38*  3.38*  HGB 10.0* 11.1* 10.7*  HCT 31.1* 34.6* 33.3*  MCV 97.5 98.9 98.5  MCH 31.3 31.7 31.7  MCHC 32.2 32.1 32.1  RDW 14.7 14.9 15.0  PLT 107* 98* 111*    Cardiac Enzymes Recent Labs  Lab 06/21/18 0138 06/21/18 0717 06/21/18 1312  TROPONINI 0.04* 0.05* 0.05*    Recent Labs  Lab 06/20/18 2328  TROPIPOC 0.00     BNP Recent Labs  Lab 06/20/18 2249  BNP 160.6*     DDimer No results for input(s): DDIMER in the last 168 hours.   Radiology    No results found.  Cardiac Studies    EF 40% on echo and severe mitral regurgitation.  Echocardiogram dated 06/21/2018  Patient Profile     72 y.o. 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  Assessment & Plan    1. Acute on chronic combined systolic and diastolic heart failure: Now approximately 2-1/2 L negative.  Will increase furosemide to every 8 hours.  Hold for systolic pressure less  than 90 mmHg. 2. Severe mitral regurgitation: Need to clarify whether she could be a candidate for MitraClip 3. Morbid obesity: No action item 4. Stage IV CKD: With more aggressive diuresis, watch for kidney function deterioration.  Stable to this point. 5. Atrial fibrillation, chronic: Good rate control.  Will inquire about feasibility of MitraClip for MR/TR.     For questions or updates, please contact [place] Please consult [email or web address] for contact info under        Signed, Sinclair Grooms, MD  06/23/2018, 1:34 PM

## 2018-06-23 NOTE — Progress Notes (Signed)
[facility] Progress Note  Patient: [name] [name] QIW:[id number]   PCP: [name], MD DOB: Apr 26, 1946   DOA: 06/20/2018   DOS: 06/23/2018   Date of Service: the patient was seen and examined on 06/23/2018  Brief hospital course: Pt. with PMH of CHF, chronic systolic, EF 41%, HTN, GERD, IDA, A. fib not on anticoagulation, morbid obesity, S/P PPM, OSA on BiPAP, CKD stage IV; admitted on 06/20/2018, presented with complaint of shortness of breath, was found to have acute on chronic systolic CHF.  Family requested consulting cardiology as well as nephrology. Currently further plan is continue IV Lasix.  Subjective: Feeling better.  No nausea no vomiting.  Gets short of breath even attempting from bed to chair.  No dizziness. Assessment and Plan: 1.  Acute on chronic combined systolic and diastolic CHF. Patient with cough and shortness of breath. Also had weight gain. BNP was 160.  But in patient with CKD as well as obesity the value is not reliable. Patient was more hypoxic than her baseline. Started on IV Lasix 80 mg twice daily with significant improvement in her breathing and continues to feel better. Still does not with her baseline. Still swelling in the leg. Unna boot placed Continue with IV Lasix for now.  Management per cardiology. PT OT consulted. Family requested cardiology consult.  Cardiology consulted appreciate their assistance.  2.  Severe mitral regurgitation. May benefit from mitral clip. Patient will follow-up with cardiology outpatient.  Appreciate cardiology assistance.  3.  Chronic kidney disease. Stage IV. Renal function stable. Family requested nephrology consult. Consulted.  4.  Left great toe injury. Topical ointment.  5.  Obstructive sleep apnea. Continue BiPAP per home regimen.  6.  Concern for pneumonia. Family has concern for pneumonia based on the chest x-ray on admission. With normal white count no leukocytosis and no fever I do not  suspect that the patient actually has pneumonia. Alternative explanation is CHF exacerbation.  Diet: Cardiac diet DVT Prophylaxis: subcutaneous Heparin  Advance goals of care discussion: DNR/DNI  Family Communication: no family was present at bedside, at the time of interview.  Discussed with family on 06/22/2018.  Disposition:  Discharge to home on Monday.  Consultants: Cardiology and nephrology at family request Procedures: Echocardiogram   Scheduled Meds: . allopurinol  100 mg Oral BID  . aspirin  325 mg Oral QPM  . calcitRIOL  0.25 mcg Oral QODAY  . calcium carbonate  1,250 mg Oral Q lunch  . ferrous sulfate  325 mg Oral QPM  . furosemide  80 mg Intravenous BID  . HYDROcodone-acetaminophen  1 tablet Oral TID  . isosorbide mononitrate  15 mg Oral Daily  . lactose free nutrition  237 mL Oral TID WC  . multivitamin with minerals  1 tablet Oral Daily  . neomycin-bacitracin-polymyxin   Topical Daily  . nystatin   Topical Daily  . polyethylene glycol  17 g Oral QODAY  . potassium chloride  20 mEq Oral BID  . sodium chloride flush  3 mL Intravenous Q12H  . vitamin B-12  500 mcg Oral Daily   Continuous Infusions: . sodium chloride     PRN Meds: sodium chloride, acetaminophen, docusate sodium, Glycerin (Adult), hydrALAZINE, HYDROcodone-acetaminophen, hydrOXYzine, nitroGLYCERIN, sodium chloride flush, zolpidem Antibiotics: Anti-infectives (From admission, onward)   Start     Dose/Rate Route Frequency Ordered Stop   06/21/18 0100  doxycycline (VIBRAMYCIN) 100 mg in sodium chloride 0.9 % 250 mL IVPB  Status:  Discontinued     100  mg 125 mL/hr over 120 Minutes Intravenous  Once 06/21/18 0048 06/21/18 0119       Objective: Physical Exam: Vitals:   06/22/18 0722 06/22/18 1650 06/23/18 0740 06/23/18 1642  BP: (!) 98/58 129/76 115/65 121/71  Pulse: 91 (!) 102 97 90  Resp: 18 19 20 20   Temp: 97.9 F (36.6 C) 97.9 F (36.6 C) 97.9 F (36.6 C) 97.9 F (36.6 C)  TempSrc: Oral  Oral Oral Oral  SpO2: 97% 98% 96% 94%  Weight:      Height:        Intake/Output Summary (Last 24 hours) at 06/23/2018 1658 Last data filed at 06/23/2018 1500 Gross per 24 hour  Intake 720 ml  Output 1150 ml  Net -430 ml   Filed Weights   06/21/18 0225  Weight: 118.7 kg   General: Alert, Awake and Oriented to Time, Place and Person. Appear in mild distress, affect appropriate Eyes: PERRL, Conjunctiva normal ENT: Oral Mucosa clear moist. Neck: difficult to assess  JVD, no Abnormal Mass Or lumps Cardiovascular: S1 and S2 Present, aortic systolic  Murmur, Peripheral Pulses Present Respiratory: normal respiratory effort, Bilateral Air entry equal and Decreased, no use of accessory muscle, basal crackles, no wheezes Abdomen: Bowel Sound present, Soft and no tenderness, no hernia Skin: no redness, no Rash, no induration Extremities: bilateral  Pedal edema, no calf tenderness Neurologic: Grossly no focal neuro deficit. Bilaterally Equal motor strength  Data Reviewed: CBC: Recent Labs  Lab 06/20/18 2249 06/22/18 0059 06/23/18 0247 06/23/18 0748  WBC 6.3 5.0 5.8 4.9  NEUTROABS  --   --   --  3.3  HGB 11.0* 10.0* 11.1* 10.7*  HCT 34.6* 31.1* 34.6* 33.3*  MCV 98.3 97.5 98.9 98.5  PLT 121* 107* 98* 831*   Basic Metabolic Panel: Recent Labs  Lab 06/20/18 2249 06/21/18 0821 06/21/18 1552 06/22/18 0059 06/23/18 0247  NA 139 138 140 139 139  K 3.5 3.1* 3.8 3.5 3.8  CL 92* 92* 93* 93* 95*  CO2 35* 32 30 35* 31  GLUCOSE 123* 114* 80 95 100*  BUN 81* 82* 85* 79* 76*  CREATININE 2.72* 2.70* 2.44* 2.56* 2.48*  CALCIUM 9.8 9.5 9.9 9.4 9.7  MG  --   --  2.7* 2.7*  --     Liver Function Tests: No results for input(s): AST, ALT, ALKPHOS, BILITOT, PROT, ALBUMIN in the last 168 hours. No results for input(s): LIPASE, AMYLASE in the last 168 hours. No results for input(s): AMMONIA in the last 168 hours. Coagulation Profile: Recent Labs  Lab 06/23/18 0748  INR 1.22   Cardiac  Enzymes: Recent Labs  Lab 06/21/18 0138 06/21/18 0717 06/21/18 1312  TROPONINI 0.04* 0.05* 0.05*   BNP (last 3 results) No results for input(s): PROBNP in the last 8760 hours. CBG: No results for input(s): GLUCAP in the last 168 hours. Studies: No results found.   Time spent: 35 minutes Severity of Illness: The appropriate patient status for this patient is INPATIENT. Inpatient status is judged to be reasonable and necessary in order to provide the required intensity of service to ensure the patient's safety. The patient's presenting symptoms, physical exam findings, and initial radiographic and laboratory data in the context of their chronic comorbidities is felt to place them at high risk for further clinical deterioration. Furthermore, it is not anticipated that the patient will be medically stable for discharge from the hospital within 2 midnights of admission. The following factors support the patient status of inpatient.   "  The patient's presenting symptoms include minimal improvement in shortnes of breath and edema. " The worrisome physical exam findings include bilateral pitting edema and hypoxia. " The initial radiographic and laboratory data are worrisome because of elevated serum creatinine. " The chronic co-morbidities include CKD stage IV, OSA, thrombocytopenia which is acute, severe mitral regurgitation with low EF.   * I certify that at the point of admission it is my clinical judgment that the patient will require inpatient hospital care spanning beyond 2 midnights from the point of admission due to high intensity of service, high risk for further deterioration and high frequency of surveillance required.*   Author: Berle Mull, MD [facility] Pager: [phone] 06/23/2018 4:58 PM  If 7PM-7AM, please contact night-coverage at [email or web address], password [facility]

## 2018-06-24 ENCOUNTER — Encounter (HOSPITAL_COMMUNITY): Payer: Self-pay | Admitting: Physician Assistant

## 2018-06-24 LAB — BASIC METABOLIC PANEL
ANION GAP: 10 (ref 5–15)
BUN: 73 mg/dL — ABNORMAL HIGH (ref 8–23)
CALCIUM: 9.8 mg/dL (ref 8.9–10.3)
CO2: 34 mmol/L — ABNORMAL HIGH (ref 22–32)
Chloride: 97 mmol/L — ABNORMAL LOW (ref 98–111)
Creatinine, Ser: 2.3 mg/dL — ABNORMAL HIGH (ref 0.44–1.00)
GFR, EST AFRICAN AMERICAN: 23 mL/min — AB (ref >60)
GFR, EST NON AFRICAN AMERICAN: 20 mL/min — AB (ref >60)
Glucose, Bld: 91 mg/dL (ref 70–99)
Potassium: 4.1 mmol/L (ref 3.5–5.1)
SODIUM: 141 mmol/L (ref 135–145)

## 2018-06-24 LAB — CBC
HCT: 35.1 % — ABNORMAL LOW (ref 36.0–46.0)
HEMOGLOBIN: 11.3 g/dL — AB (ref 12.0–15.0)
MCH: 31.9 pg (ref 26.0–34.0)
MCHC: 32.2 g/dL (ref 30.0–36.0)
MCV: 99.2 fL (ref 78.0–100.0)
PLATELETS: 112 10*3/uL — AB (ref 150–400)
RBC: 3.54 MIL/uL — AB (ref 3.87–5.11)
RDW: 14.8 % (ref 11.5–15.5)
WBC: 4.7 10*3/uL (ref 4.0–10.5)

## 2018-06-24 MED ORDER — FUROSEMIDE 10 MG/ML IJ SOLN
80.0000 mg | Freq: Two times a day (BID) | INTRAMUSCULAR | Status: DC
  Administered 2018-06-24 – 2018-06-25 (×3): 80 mg via INTRAVENOUS
  Filled 2018-06-24 (×2): qty 8

## 2018-06-24 MED ORDER — FUROSEMIDE 10 MG/ML IJ SOLN: 80.0000 mg | Freq: Three times a day (TID) | INTRAMUSCULAR | Status: DC

## 2018-06-24 NOTE — Progress Notes (Signed)
[facility] Progress Note  Patient: [name] [name] ZOX:[id number]   PCP: [name], MD DOB: [birth year]   DOA: 06/20/2018   DOS: 06/24/2018   Date of Service: the patient was seen and examined on 06/24/2018  Brief hospital course: Pt. with PMH of CHF, chronic systolic, EF 81%, HTN, GERD, IDA, A. fib not on anticoagulation, morbid obesity, S/P PPM, OSA on BiPAP, CKD stage IV; admitted on 06/20/2018, presented with complaint of shortness of breath, was found to have acute on chronic systolic CHF.  Family requested consulting cardiology as well as nephrology. Currently further plan is continue IV Lasix.  Subjective: Continues to feel better.  Breathing is still heavy but better than before.  Not back to baseline.  No nausea no vomiting.  No diarrhea.  Assessment and Plan: 1.  Acute on chronic combined systolic and diastolic CHF. Patient with cough and shortness of breath. Also had weight gain. BNP was 160.  But in patient with CKD as well as obesity the value is not reliable. Patient was more hypoxic than her baseline. Started on IV Lasix 80 mg twice daily with significant improvement in her breathing and continues to feel better. Still does not with her baseline. Swelling in the leg has improved significantly.  Unna boot removed. Continue with IV Lasix for now.  Management per cardiology. PT OT consulted.  Recommend home health. Family requested cardiology consult.  Cardiology consulted appreciate their assistance.  2.  Severe mitral regurgitation. May benefit from mitral clip. Patient will follow-up with cardiology outpatient.  Appreciate cardiology assistance.  3.  Chronic kidney disease. Stage IV. Renal function stable. Family requested nephrology consult. Consulted.  Outpatient follow-up with nephrology in 3 weeks.  4.  Left great toe injury. Topical ointment.  5.  Obstructive sleep apnea. Continue BiPAP per home regimen.  6.  Concern for pneumonia. Family has  concern for pneumonia based on the chest x-ray on admission. With normal white count no leukocytosis and no fever I do not suspect that the patient actually has pneumonia. Alternative explanation is CHF exacerbation.  Diet: Cardiac diet DVT Prophylaxis: subcutaneous Heparin  Advance goals of care discussion: DNR/DNI  Family Communication: no family was present at bedside, at the time of interview.  Discussed with family on 06/22/2018.  Disposition:  Discharge to home depending on cardiology clearance.  Consultants: Cardiology and nephrology at family request Procedures: Echocardiogram   Scheduled Meds: . allopurinol  100 mg Oral BID  . aspirin  325 mg Oral QPM  . calcitRIOL  0.25 mcg Oral QODAY  . calcium carbonate  1,250 mg Oral Q lunch  . ferrous sulfate  325 mg Oral QPM  . furosemide  80 mg Intravenous BID  . HYDROcodone-acetaminophen  1 tablet Oral TID  . isosorbide mononitrate  15 mg Oral Daily  . lactose free nutrition  237 mL Oral TID WC  . multivitamin with minerals  1 tablet Oral Daily  . neomycin-bacitracin-polymyxin   Topical Daily  . nystatin   Topical Daily  . polyethylene glycol  17 g Oral QODAY  . potassium chloride  20 mEq Oral BID  . sodium chloride flush  3 mL Intravenous Q12H  . vitamin B-12  500 mcg Oral Daily   Continuous Infusions: . sodium chloride     PRN Meds: sodium chloride, acetaminophen, docusate sodium, Glycerin (Adult), hydrALAZINE, HYDROcodone-acetaminophen, hydrOXYzine, nitroGLYCERIN, sodium chloride flush, zolpidem Antibiotics: Anti-infectives (From admission, onward)   Start     Dose/Rate Route Frequency Ordered Stop   06/21/18  0100  doxycycline (VIBRAMYCIN) 100 mg in sodium chloride 0.9 % 250 mL IVPB  Status:  Discontinued     100 mg 125 mL/hr over 120 Minutes Intravenous  Once 06/21/18 0048 06/21/18 0119       Objective: Physical Exam: Vitals:   06/23/18 1642 06/24/18 0000 06/24/18 0132 06/24/18 0845  BP: 121/71 118/62  128/64    Pulse: 90 92  98  Resp: 20 20  (!) 22  Temp: 97.9 F (36.6 C) 98 F (36.7 C)  98.1 F (36.7 C)  TempSrc: Oral Oral  Oral  SpO2: 94% 95%  93%  Weight:   118.7 kg   Height:        Intake/Output Summary (Last 24 hours) at 06/24/2018 1701 Last data filed at 06/24/2018 1041 Gross per 24 hour  Intake 360 ml  Output 1390 ml  Net -1030 ml   Filed Weights   06/21/18 0225 06/24/18 0132  Weight: 118.7 kg 118.7 kg   General: Alert, Awake and Oriented to Time, Place and Person. Appear in mild distress, affect appropriate Eyes: PERRL, Conjunctiva normal ENT: Oral Mucosa clear moist. Neck: difficult to assess  JVD, no Abnormal Mass Or lumps Cardiovascular: S1 and S2 Present, aortic systolic  Murmur, Peripheral Pulses Present Respiratory: normal respiratory effort, Bilateral Air entry equal and Decreased, no use of accessory muscle, basal crackles, no wheezes Abdomen: Bowel Sound present, Soft and no tenderness, no hernia Skin: no redness, no Rash, no induration Extremities: bilateral  Pedal edema, no calf tenderness Neurologic: Grossly no focal neuro deficit. Bilaterally Equal motor strength  Data Reviewed: CBC: Recent Labs  Lab 06/20/18 2249 06/22/18 0059 06/23/18 0247 06/23/18 0748 06/24/18 0331  WBC 6.3 5.0 5.8 4.9 4.7  NEUTROABS  --   --   --  3.3  --   HGB 11.0* 10.0* 11.1* 10.7* 11.3*  HCT 34.6* 31.1* 34.6* 33.3* 35.1*  MCV 98.3 97.5 98.9 98.5 99.2  PLT 121* 107* 98* 111* 694*   Basic Metabolic Panel: Recent Labs  Lab 06/21/18 0821 06/21/18 1552 06/22/18 0059 06/23/18 0247 06/24/18 0331  NA 138 140 139 139 141  K 3.1* 3.8 3.5 3.8 4.1  CL 92* 93* 93* 95* 97*  CO2 32 30 35* 31 34*  GLUCOSE 114* 80 95 100* 91  BUN 82* 85* 79* 76* 73*  CREATININE 2.70* 2.44* 2.56* 2.48* 2.30*  CALCIUM 9.5 9.9 9.4 9.7 9.8  MG  --  2.7* 2.7*  --   --     Liver Function Tests: No results for input(s): AST, ALT, ALKPHOS, BILITOT, PROT, ALBUMIN in the last 168 hours. No results  for input(s): LIPASE, AMYLASE in the last 168 hours. No results for input(s): AMMONIA in the last 168 hours. Coagulation Profile: Recent Labs  Lab 06/23/18 0748  INR 1.22   Cardiac Enzymes: Recent Labs  Lab 06/21/18 0138 06/21/18 0717 06/21/18 1312  TROPONINI 0.04* 0.05* 0.05*   BNP (last 3 results) No results for input(s): PROBNP in the last 8760 hours. CBG: No results for input(s): GLUCAP in the last 168 hours. Studies: No results found.   Time spent: 35 minutes   Author: Berle Mull, MD [facility] Pager: [phone] 06/24/2018 5:01 PM  If 7PM-7AM, please contact night-coverage at [email or web address], password [email or web address]

## 2018-06-24 NOTE — Progress Notes (Addendum)
Progress Note  Patient Name: [name] [name] Date of Encounter: 06/24/2018  Primary Cardiologist: Dorris Carnes, MD   Subjective   [name] says that she is breathing much better.  She feels like her lower extremity edema is better as her toes are now wrinkled and set of full of fluid.  Inpatient Medications    Scheduled Meds: . allopurinol  100 mg Oral BID  . aspirin  325 mg Oral QPM  . calcitRIOL  0.25 mcg Oral QODAY  . calcium carbonate  1,250 mg Oral Q lunch  . ferrous sulfate  325 mg Oral QPM  . furosemide  80 mg Intravenous BID  . HYDROcodone-acetaminophen  1 tablet Oral TID  . isosorbide mononitrate  15 mg Oral Daily  . lactose free nutrition  237 mL Oral TID WC  . multivitamin with minerals  1 tablet Oral Daily  . neomycin-bacitracin-polymyxin   Topical Daily  . nystatin   Topical Daily  . polyethylene glycol  17 g Oral QODAY  . potassium chloride  20 mEq Oral BID  . sodium chloride flush  3 mL Intravenous Q12H  . vitamin B-12  500 mcg Oral Daily   Continuous Infusions: . sodium chloride     PRN Meds: sodium chloride, acetaminophen, docusate sodium, Glycerin (Adult), hydrALAZINE, HYDROcodone-acetaminophen, hydrOXYzine, nitroGLYCERIN, sodium chloride flush, zolpidem   Vital Signs    Vitals:   06/23/18 1642 06/24/18 0000 06/24/18 0132 06/24/18 0845  BP: 121/71 118/62  128/64  Pulse: 90 92  98  Resp: 20 20  (!) 22  Temp: 97.9 F (36.6 C) 98 F (36.7 C)  98.1 F (36.7 C)  TempSrc: Oral Oral  Oral  SpO2: 94% 95%  93%  Weight:   118.7 kg   Height:        Intake/Output Summary (Last 24 hours) at 06/24/2018 1331 Last data filed at 06/24/2018 1041 Gross per 24 hour  Intake 600 ml  Output 1890 ml  Net -1290 ml   Filed Weights   06/21/18 0225 06/24/18 0132  Weight: 118.7 kg 118.7 kg    Telemetry    Ventricular pacing at 90 bpm- Personally Reviewed  ECG    No new tracings- Personally Reviewed  Physical Exam   GEN:  Obese, chronically ill female  no acute distress.   Neck:  Minimal JVD Cardiac: RRR, 3/6 apical  murmur Respiratory: Clear to auscultation bilaterally. GI: Soft, nontender, non-distended  MS:  1+ lower extremity edema;  Neuro:  Nonfocal  Psych: Normal affect   Labs    Chemistry Recent Labs  Lab 06/22/18 0059 06/23/18 0247 06/24/18 0331  NA 139 139 141  K 3.5 3.8 4.1  CL 93* 95* 97*  CO2 35* 31 34*  GLUCOSE 95 100* 91  BUN 79* 76* 73*  CREATININE 2.56* 2.48* 2.30*  CALCIUM 9.4 9.7 9.8  GFRNONAA 17* 18* 20*  GFRAA 20* 21* 23*  ANIONGAP 11 13 10      Hematology Recent Labs  Lab 06/23/18 0247 06/23/18 0748 06/24/18 0331  WBC 5.8 4.9 4.7  RBC 3.50* 3.38*  3.38* 3.54*  HGB 11.1* 10.7* 11.3*  HCT 34.6* 33.3* 35.1*  MCV 98.9 98.5 99.2  MCH 31.7 31.7 31.9  MCHC 32.1 32.1 32.2  RDW 14.9 15.0 14.8  PLT 98* 111* 112*    Cardiac Enzymes Recent Labs  Lab 06/21/18 0138 06/21/18 0717 06/21/18 1312  TROPONINI 0.04* 0.05* 0.05*    Recent Labs  Lab 06/20/18 2328  TROPIPOC 0.00  BNP Recent Labs  Lab 06/20/18 2249  BNP 160.6*     DDimer No results for input(s): DDIMER in the last 168 hours.   Radiology    No results found.  Cardiac Studies   Echocardiogram 06/21/2018 Study Conclusions - Left ventricle: The cavity size was severely dilated. Wall   thickness was increased in a pattern of mild LVH. Systolic   function was mildly to moderately reduced. The estimated ejection   fraction was in the range of 40% to 45%. There is akinesis of the   mid-apicalanteroseptal and apical myocardium. The study is not   technically sufficient to allow evaluation of LV diastolic   function. - Aortic valve: Valve mobility was restricted. There was mild   stenosis. - Mitral valve: Calcified annulus. There was severe regurgitation. - Left atrium: The atrium was severely dilated. - Right atrium: The atrium was severely dilated. - Tricuspid valve: There was moderate regurgitation. - Pulmonary  arteries: Systolic pressure was mildly increased. PA   peak pressure: 43 mm Hg (S).  Impressions: - Akinesis of the distal septum and apex; overall mild to moderate   LV dysfunction; severe LVE; mild AS (mean gradient 14 mmHg);   severe MR; severe biatrial enlargement; moderate TR with mild   pulmonary hypertension.  Patient Profile     72 y.o. 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  Net -3.1 L fluid balance since admission -Weight is unchanged -Blood pressure is stable -Potassium is stable and serum creatinine has slightly improved -Patient has dyssynchronous movement of the left ventricle which may be related to pacing of the RV.  Possibly could benefit from Bi V pacing.  She can follow-up with Dr. Lovena Le outpatient. -Patient is improved symptomatically.  Plan to change to oral diuretic tomorrow and likely home in 1-2 days with her home Demadex dosing  Severe mitral regurgitation:  -The patient is not a good candidate for open surgical repair but may be a candidate for MitraClip -Dr. Burt Knack has been contacted and says that he will evaluate her.  This could probably be done outpatient once she is fully compensated from her heart failure. -The patient tells me that she had a bleeding issue with her last groin stick and is not sure she would want that again.   Stage IV CKD:  -Serum creatinine has slowly improved from 2.7 to 2.3. -She is followed outpatient by nephrology who has also seen her in the hospital yesterday.  Appreciate input.  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  She is on aspirin 325 mg daily.  Obstructive sleep apnea -Compliant with CPAP  Morbid obesity -Body mass index is 42.24 kg/m.  -The patient is not ambulatory.  She uses a scooter and wheelchair at home.   For questions or updates, please contact [place] Please consult [email or web address] for contact info under        Signed, Daune Perch, NP  06/24/2018,  1:31 PM   ---------------------------------------------------------------------- History and all data above reviewed.  Patient examined.  I agree with the findings as above.   Mrs. Umbaugh feels better today, however is still visibly short of breath on exam.  The patient exam reveals: Gen: In no acute distress CV: Distant heart sounds due to body habitus, normal rate.  3/6 holosystolic murmur appreciated across the precordium Lungs: Shallow breath sounds Abd: Soft and nontender Ext significant venous stasis change of the bilateral lower extremities, stasis dermatitis with ulceration, most notable on the left lower extremity.    All available labs, radiology testing, previous records reviewed. Agree with documented assessment and plan.  Mrs. Hepner should continue to be diuresed given that she is still clinically in heart failure. Given that she has severe mitral regurgitation and would not be an ideal surgical candidate given multiple medical issues, the question of mitral clip has been raised.  Due to previous bleeding issues with femoral access, the patient is extremely hesitant to have a femoral approach for any intervention, which would limit our ability to perform mitral clip for mitral valve repair.  She has an appointment with our interventional colleagues in approximately 2 weeks after hospital dismissal where this can be discussed  at length, however I question whether she would be a good candidate for minimally invasive mitral valve repair with MitraClip.  I would recommend heart failure follow-up in addition to a discussion of MitraClip given that she does not have a good understanding as to why she had a heart failure exacerbation after a period of quiescence.   Elouise Munroe  4:58 PM  06/24/2018

## 2018-06-24 NOTE — H&P (Signed)
Expand All Collapse All    Show:Clear all [x] Manual[x] Template[x] Copied  Added by: [x] Ivor Costa, MD  [] Hover for details  History and Physical    [name] [name] ZJI:[id number] DOB: 24-Aug-1946 DOA: 06/20/2018  Referring MD/NP/PA:   PCP: [name], MD      Patient coming from:  The patient is coming from home.  At baseline, pt is independent for most of ADL.   Chief Complaint: SOB and chest pain  HPI: [name] [name] is a 72 y.o. female with medical history significant of CHF with EF of 35%, hypertension, GERD, gout, iron deficiency anemia, atrial fibrillation not on anticoagulants, morbid obesity, pacemaker placement, OSA on BiPAP, CKD 4, who presents with shortness of breath and chest pain.  Patient states that she has been having shortness of breath in the past 5 days, which has progressively getting worse.  She also has worsening bilateral leg edema.  She has gained more than 3 pounds recently. The shortness of breath is associated with intermittent chest pain, which is located in the substernal area, pressure-like, 7 out of 10 in severity sometimes, currently 3 out of 10 severity, radiating to the left shoulder.  The chest pain is not pleuritic, not aggravated by deep breath or coughing.  Patient has mild dry cough, but no fever or chills.  No recent long distant traveling.   Per her daughter, pt she had syncopal episode while she was in her motorized wheelchair Sunday, lasted for a few seconds . She probably compressed her left shoulder on the handle of wheelchair, causing bruise in L shoulder/deltoid area.  Patient denies any tingling, numbness or weakness in extremities.  No facial droop, slurred speech.  No vision change or hearing loss.  She has nausea intermittently, but no vomiting, diarrhea or abdominal pain.  Denies symptoms of UTI.   ED Course: pt was found to have BNP 160, negative troponin, WBC 6.3, worsening renal function, temperature normal, heart rate  46-103, oxygen saturation 93% on room air, temperature normal, chest x-ray showed new opacity in the right lower lobe.  Patient is placed on telemetry bed for observation.  Review of Systems:   General: no fevers, chills, has nody weight gain, has poor appetite, has fatigue HEENT: no blurry vision, hearing changes or sore throat Respiratory: has dyspnea, coughing, no wheezing CV: has chest pain, no palpitations GI: no nausea, vomiting, abdominal pain, diarrhea, constipation GU: no dysuria, burning on urination, increased urinary frequency, hematuria  Ext: has leg edema Neuro: no unilateral weakness, numbness, or tingling, no vision change or hearing loss. Had syncope. Skin: no rash, no skin tear. MSK: No muscle spasm, no deformity, no limitation of range of movement in spin Heme: No easy bruising.  Travel history: No recent long distant travel.  Allergy:  Allergies  Allergen Reactions  . Cephalexin Shortness Of Breath    sob  . Codeine Anaphylaxis    REACTION: throat swelling  . Contrast Media [Iodinated Diagnostic Agents] Other (See Comments)    Patient states "I have chronic kidney disease so the doctor said no dye in my veins."  . Coreg [Carvedilol] Other (See Comments)    Beta Blockers cause her organs to shut down per patient  . Peppermint Flavor Shortness Of Breath  . Prednisone Anaphylaxis, Shortness Of Breath and Swelling    REACTION: swelling, S.O.B.  . Tape Other (See Comments)    States plastic tape blisters her skin  . Ciprofloxacin Hives  . Latex Itching and Rash  . Penicillins Hives  Past Medical History:  Diagnosis Date  . Anemia   . Atrial fibrillation ([place])   . Bursitis   . Cancer ([place])   . Cataract   . Cervical cancer ([place]) 1991   s/p hysterectomy  . Chronic cellulitis   . Chronic combined systolic and diastolic congestive heart failure, NYHA class 2 (HCC)    LVEF 25-30% with restrictive diastolic filling  . Degenerative joint disease   .  Diverticulitis   . Essential hypertension   . Gallstones   . GERD (gastroesophageal reflux disease)   . Gout   . Kidney stones   . Morbid obesity ([place])   . NICM (nonischemic cardiomyopathy) ([place]) 02/22/2017  . Osteoarthritis   . Permanent atrial fibrillation ([place]) 05/04/2009   Qualifier: History of  By: Quentin Cornwall CMA, Janett Billow    . 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 Sinoatrial node dysfunction [facility])    Status post PPM - Dr. Lovena Le  . Sleep apnea    CPAP    Past Surgical History:  Procedure Laterality Date  . ABDOMINAL HYSTERECTOMY  1991  . COLONOSCOPY  1998   one polyp per patient  . EYE SURGERY    . PACEMAKER INSERTION  Nov 2000   St Jude with revision in 2011  . PERCUTANEOUS NEPHROLITHOTOMY  April 2012    Social History:  reports that she quit smoking about 38 years ago. Her smoking use included cigarettes. She smoked 1.00 pack per day. She has never used smokeless tobacco. She reports that she does not drink alcohol or use drugs.  Family History:  Family History  Problem Relation Age of Onset  . Heart attack Father   . Stroke Mother   . Diabetes Sister   . Colon cancer Other        seven family members  . Liver disease Neg Hx   . Other Neg Hx      Prior to Admission medications   Medication Sig Start Date End Date Taking? Authorizing Provider  allopurinol (ZYLOPRIM) 100 MG tablet Take 100 mg by mouth 2 (two) times daily.    [provider]  aspirin 325 MG tablet Take 325 mg by mouth daily.      [provider]  calcitRIOL (ROCALTROL) 0.25 MCG capsule Take 0.25 mcg by mouth every other day.     [provider]  calcium carbonate (OS-CAL) 600 MG tablet Take 600 mg by mouth daily.    [provider]  COD LIVER OIL PO Take 1 tablet by mouth daily.      [provider]  docusate sodium (COLACE) 100 MG capsule Take 200 mg by mouth daily as needed for  mild constipation.     [provider]  feeding supplement (BOOST HIGH PROTEIN) LIQD Take 1 Container by mouth daily.    [provider]  ferrous sulfate 325 (65 FE) MG tablet Take 325 mg by mouth every evening.     [provider]  Glycerin, Adult, 2.1 g SUPP Place 1 suppository rectally daily as needed for moderate constipation. 02/22/17   Barrett, Evelene Croon, PA-C  HYDROcodone-acetaminophen (NORCO) 10-325 MG per tablet Take 1 tablet by mouth 2 (two) times daily.     [provider]  isosorbide mononitrate (IMDUR) 30 MG 24 hr tablet Take 0.5 tablets (15 mg total) by mouth daily. 12/10/17   Fay Records, MD  metolazone (ZAROXOLYN) 2.5 MG tablet Take  1 tablet (2.5 mg total) by mouth as needed. Take once as instructed. 05/30/18   Evans Lance, MD  Multiple Vitamin (MULTIVITAMIN) tablet Take 1 tablet by mouth daily.    [provider]  nystatin (MYCOSTATIN/NYSTOP) powder Apply 1 application topically every other day. 02/05/17   [provider]  nystatin-triamcinolone ointment (MYCOLOG) Apply topically 2 (two) times daily. 02/22/17   Barrett, Evelene Croon, PA-C  torsemide (DEMADEX) 20 MG tablet TAKE 4 TABLETS (80MG) BY MOUTH IN THE MORNING AND EACH EVENING DAILY. 06/12/18   Bhagat, Crista Luria, PA  vitamin B-12 (CYANOCOBALAMIN) 500 MCG tablet Take 500 mcg by mouth daily.    [provider]    Physical Exam: Vitals:   06/21/18 0030 06/21/18 0100 06/21/18 0130 06/21/18 0141  BP: (!) 141/58   103/74  Pulse: (!) 55 98 91 98  Resp: (!) 21 (!) 28 (!) 22 (!) 23  Temp:      TempSrc:      SpO2: 96% 93% 95% 94%   General: Not in acute distress HEENT:       Eyes: PERRL, EOMI, no scleral icterus.       ENT: No discharge from the ears and nose, no pharynx injection, no tonsillar enlargement.        Neck: Difficult to assess JVD, no bruit, no mass felt. Heme: No neck lymph node enlargement. Cardiac: S1/S2, RRR, No murmurs, No gallops or  rubs. Respiratory: No rales, wheezing, rhonchi or rubs. GI: Soft, nondistended, nontender, no rebound pain, no organomegaly, BS present. GU: No hematuria Ext: 3+ pitting leg edema bilaterally. Has chronic venous insufficiency change. 1+DP/PT pulse bilaterally. Musculoskeletal: No joint deformities, No joint redness or warmth, no limitation of ROM in spin. Skin: No rashes.  Neuro: Alert, oriented X3, cranial nerves II-XII grossly intact, moves all extremities normally. Psych: Patient is not psychotic, no suicidal or hemocidal ideation.  Labs on Admission: I have personally reviewed following labs and imaging studies  CBC: Recent Labs  Lab 06/20/18 2249  WBC 6.3  HGB 11.0*  HCT 34.6*  MCV 98.3  PLT 175*   Basic Metabolic Panel: Recent Labs  Lab 06/20/18 2249  NA 139  K 3.5  CL 92*  CO2 35*  GLUCOSE 123*  BUN 81*  CREATININE 2.72*  CALCIUM 9.8   GFR: CrCl cannot be calculated (Unknown ideal weight.). Liver Function Tests: No results for input(s): AST, ALT, ALKPHOS, BILITOT, PROT, ALBUMIN in the last 168 hours. No results for input(s): LIPASE, AMYLASE in the last 168 hours. No results for input(s): AMMONIA in the last 168 hours. Coagulation Profile: No results for input(s): INR, PROTIME in the last 168 hours. Cardiac Enzymes: No results for input(s): CKTOTAL, CKMB, CKMBINDEX, TROPONINI in the last 168 hours. BNP (last 3 results) No results for input(s): PROBNP in the last 8760 hours. HbA1C: No results for input(s): HGBA1C in the last 72 hours. CBG: No results for input(s): GLUCAP in the last 168 hours. Lipid Profile: No results for input(s): CHOL, HDL, LDLCALC, TRIG, CHOLHDL, LDLDIRECT in the last 72 hours. Thyroid Function Tests: No results for input(s): TSH, T4TOTAL, FREET4, T3FREE, THYROIDAB in the last 72 hours. Anemia Panel: No results for input(s): VITAMINB12, FOLATE, FERRITIN, TIBC, IRON, RETICCTPCT in the last 72 hours. Urine analysis:    Component Value  Date/Time   COLORURINE YELLOW 12/01/2016 1224   APPEARANCEUR CLEAR 12/01/2016 1224   LABSPEC 1.009 12/01/2016 1224   PHURINE 6.0 12/01/2016 1224   GLUCOSEU NEGATIVE 12/01/2016 1224   HGBUR  SMALL (A) 12/01/2016 1224   BILIRUBINUR NEGATIVE 12/01/2016 1224   KETONESUR NEGATIVE 12/01/2016 1224   PROTEINUR NEGATIVE 12/01/2016 1224   UROBILINOGEN 0.2 03/06/2013 2035   NITRITE NEGATIVE 12/01/2016 1224   LEUKOCYTESUR LARGE (A) 12/01/2016 1224   Sepsis Labs: @LABRCNTIP (procalcitonin:4,lacticidven:4) )No results found for this or any previous visit (from the past 240 hour(s)).   Radiological Exams on Admission: Dg Chest 2 View  Result Date: 06/20/2018 CLINICAL DATA:  Chest pain and dyspnea worse since Sunday. EXAM: CHEST - 2 VIEW COMPARISON:  01/30/2018 FINDINGS: Obscuration of the right heart border and right hemidiaphragm from pulmonary consolidation and probable superimposed pleural fluid. Enlarged cardiac silhouette. Pulmonary vascular redistribution and chronic interstitial prominence. Moderate aortic atherosclerosis at the arch. Left-sided pacemaker apparatus with right ventricular lead noted. IMPRESSION: New confluent opacities at the right lung base obscuring the right heart border and right hemidiaphragm suspicious for pulmonary consolidation/pneumonia. Superimposed pleural fluid is suspected . Electronically Signed   By: Ashley Royalty M.D.   On: 06/20/2018 23:30     EKG: Independently reviewed.  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  She has bilateral 3+ leg edema and weight gain, consistent with CHF exacerbation.  BNP is 160, which is likely falsely low due to obesity.  Chest x-ray showed opacity in the right lower lobe, but patient does not have fever or leukocytosis.  Clinically does not seem to have pneumonia.  Doxycycline was ordered in ED, which will be discontinued.  Patient states that she never had fever when she had infection in the past, blood culture was drawn in ED-->will need to be followed up.  -will place on tele bed for obs -Lasix 80 mg bid by IV -trop x 3 -2d echo -Daily weights -strict I/O's -Low salt diet  Chest pain: Likely due to demand ischemia secondary to CHF exacerbation.  Troponin negative.  Patient does not have pleuritic chest pain or signs of DVT, low suspicions for PE. - cycle CE q6 x3 and repeat EKG in the am  - prn Nitroglycerin, norco and aspirin, imdur - Risk factor stratification: will check FLP and A1C  - 2d echo  HTN:  -on IV lasix -IV hydralazine prn  Atrial Fibrillation: CHA2DS2-VASc Score is 4, needs oral anticoagulation, but pt is not on AC, not sure why. Pt is followed by pt Card, Dr. Lovena Le. Heart rate is 44-103 in ED. -tele monitoring -f/u with Dr. 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  Baseline creatinine 1.8-2.0.  She had creatinine 2.64 on 05/25/2017.  Today her creatinine is at 2.72, BUN 81, likely due to cardiorenal syndrome.  Patient is followed up by Dr. Jimmy Footman. - Family wants [place] to consult Dr. Jimmy Footman in am. -f/u renal Fx by BMP  Iron deficiency: hgb stable. 11.0 -f/u by CBC  Syncope: Etiology is not clear.  Happened 5 days ago, no focal neurologic findings.  May be related to CHF.  Denies any head injury, no neck  pain or headache.  Patient states that she cannot lay flat to do CT of head, therefore refused CT head. She has PPM, cannot do MRI-brain. -Observe  closely for any new focal neurologic signs or seizures. -Follow-up 2D echo.   DVT ppx: SQ Heparin  Code Status: DNR (I discussed with patient in the presence of her daughter who is the POA, and explained the meaning of CODE STATUS. Patient wants to be DNR) Family Communication: Yes, patient's daughters at bed side Disposition Plan:  Anticipate discharge back to previous home environment Consults called:  none Admission status: Obs / tele    Date of Service 06/21/2018    Ivor Costa [facility] Pager [phone]  If 7PM-7AM, please contact night-coverage [email or web address] Password [email or web address] 06/21/2018, 1:58 AM

## 2018-06-24 NOTE — Progress Notes (Signed)
[facility] ROUNDING NOTE   Subjective:   Feeling improved but not back to baseline.   Urine output 1.5 L 06/23/2018 weight 118.7kg  blood pressure 118/62 pulse 90-100 temperature 98 O2 sats 95% RA    Objective:  Vital signs in last 24 hours:  Temp:  [97.9 F (36.6 C)-98 F (36.7 C)] 98 F (36.7 C) (09/30 0000) Pulse Rate:  [90-97] 92 (09/30 0000) Resp:  [20] 20 (09/30 0000) BP: (115-121)/(62-71) 118/62 (09/30 0000) SpO2:  [94 %-96 %] 95 % (09/30 0000) Weight:  [118.7 kg] 118.7 kg (09/30 0132)  Weight change:  Filed Weights   06/21/18 0225 06/24/18 0132  Weight: 118.7 kg 118.7 kg    Intake/Output: I/O last 3 completed shifts: In: 720 [P.O.:720] Out: 1500 [Urine:1500]   Intake/Output this shift:  No intake/output data recorded.  CVS- RRR holosystolic murmur  Lungs - normal WOB, clear anteriorly, dec BS bases bilaterally, no rales or wheezes, no coughing ABD- BS present soft non-distended EXT-Trace to 1+ edema BL with ruddy scaling discoloration   Basic Metabolic Panel: Recent Labs  Lab 06/21/18 0821 06/21/18 1552 06/22/18 0059 06/23/18 0247 06/24/18 0331  NA 138 140 139 139 141  K 3.1* 3.8 3.5 3.8 4.1  CL 92* 93* 93* 95* 97*  CO2 32 30 35* 31 34*  GLUCOSE 114* 80 95 100* 91  BUN 82* 85* 79* 76* 73*  CREATININE 2.70* 2.44* 2.56* 2.48* 2.30*  CALCIUM 9.5 9.9 9.4 9.7 9.8  MG  --  2.7* 2.7*  --   --     Liver Function Tests: No results for input(s): AST, ALT, ALKPHOS, BILITOT, PROT, ALBUMIN in the last 168 hours. No results for input(s): LIPASE, AMYLASE in the last 168 hours. No results for input(s): AMMONIA in the last 168 hours.  CBC: Recent Labs  Lab 06/20/18 2249 06/22/18 0059 06/23/18 0247 06/23/18 0748 06/24/18 0331  WBC 6.3 5.0 5.8 4.9 4.7  NEUTROABS  --   --   --  3.3  --   HGB 11.0* 10.0* 11.1* 10.7* 11.3*  HCT 34.6* 31.1* 34.6* 33.3* 35.1*  MCV 98.3 97.5 98.9 98.5 99.2  PLT 121* 107* 98* 111* 112*    Cardiac  Enzymes: Recent Labs  Lab 06/21/18 0138 06/21/18 0717 06/21/18 1312  TROPONINI 0.04* 0.05* 0.05*    BNP: Invalid input(s): POCBNP  CBG: No results for input(s): GLUCAP in the last 168 hours.  Microbiology: Results for orders placed or performed during the hospital encounter of 06/20/18  Blood culture (routine x 2)     Status: None (Preliminary result)   Collection Time: 06/21/18 12:49 AM  Result Value Ref Range Status   Specimen Description BLOOD LEFT WRIST  Final   Special Requests   Final    BOTTLES DRAWN AEROBIC AND ANAEROBIC Blood Culture results may not be optimal due to an excessive volume of blood received in culture bottles   Culture   Final    NO GROWTH 2 DAYS Performed at [facility], [place] [address], [place], [place] [address]    Report Status PENDING  Incomplete  Blood culture (routine x 2)     Status: None (Preliminary result)   Collection Time: 06/21/18 12:57 AM  Result Value Ref Range Status   Specimen Description BLOOD RIGHT WRIST  Final   Special Requests   Final    BOTTLES DRAWN AEROBIC AND ANAEROBIC Blood Culture results may not be optimal due to an excessive volume of blood received in culture  bottles   Culture   Final    NO GROWTH 2 DAYS Performed at [facility], [place] [address], [place], [place] [address]    Report Status PENDING  Incomplete    Coagulation Studies: Recent Labs    06/23/18 0748  LABPROT 15.3*  INR 1.22    Urinalysis: No results for input(s): COLORURINE, LABSPEC, PHURINE, GLUCOSEU, HGBUR, BILIRUBINUR, KETONESUR, PROTEINUR, UROBILINOGEN, NITRITE, LEUKOCYTESUR in the last 72 hours.  Invalid input(s): APPERANCEUR    Imaging: No results found.   Medications:   . sodium chloride     . allopurinol  100 mg Oral BID  . aspirin  325 mg Oral QPM  . calcitRIOL  0.25 mcg Oral QODAY  . calcium carbonate  1,250 mg Oral Q lunch  . ferrous sulfate  325 mg Oral QPM  . furosemide  80 mg Intravenous BID  .  HYDROcodone-acetaminophen  1 tablet Oral TID  . isosorbide mononitrate  15 mg Oral Daily  . lactose free nutrition  237 mL Oral TID WC  . multivitamin with minerals  1 tablet Oral Daily  . neomycin-bacitracin-polymyxin   Topical Daily  . nystatin   Topical Daily  . polyethylene glycol  17 g Oral QODAY  . potassium chloride  20 mEq Oral BID  . sodium chloride flush  3 mL Intravenous Q12H  . vitamin B-12  500 mcg Oral Daily   sodium chloride, acetaminophen, docusate sodium, Glycerin (Adult), hydrALAZINE, HYDROcodone-acetaminophen, hydrOXYzine, nitroGLYCERIN, sodium chloride flush, zolpidem  Assessment/ Plan:   Chronic renal insufficiency stage IV baseline creatinine 2.5-3.0 with currently stable renal function.  There is no use nonsteroidal inflammatories Cox 2 inhibitors of ACE inhibitors, no IV contrast administration.  She has f/u with Dr. Jimmy Footman in clinic in 3 weeks already arranged.   Decompensated congestive heart failure Lasix has been given intravenously 80 mg IV every 12 hours with good diuretic response.  Home medications include Demadex 80 mg twice daily and metolazone as needed.  I have kept her on lasix 80 IV BID for today.    Hypertension/volume appears adequately controlled with IV diuretic therapy and current medictions  Anemia and stable no ESA at this time continues oral ferrous sulfate  BMM Calcitrol 0.25 mcg daily she uses calcium carbonate once daily  Mitral regurgitation followed by cardiology according to Dr. Tamala Julian she may be a candidate for mitraClip  Congestive heart failure EF 25%  History of atrial fibrillation is refused anticoagulation  Obstructive sleep apnea continues on BiPAP with home machine   LOS: 2 Jannifer Hick A @TODAY @7 :21 AM

## 2018-06-24 NOTE — Progress Notes (Signed)
  [facility]   MULTIDISCIPLINARY HEART VALVE TEAM   Outpatient structural heart visit with Dr. Burt Knack arranged for 10/16 to discuss possible MitraClip. Apt placed in chart.   Angelena Form PA-C  [facility]

## 2018-06-24 NOTE — Evaluation (Signed)
Physical Therapy Evaluation Patient Details Name: [name] [name] MRN: [id number] DOB: [birth year] Today's Date: 06/24/2018   History of Present Illness  72yo female c/o SOB, found to have acute on chronic CHF. 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 She was able to complete functional transfers with S and no device with overall good safety awareness noted, no significant deficits noted with mobility this morning. She is non-ambulatory at baseline and reports she generally feels that she is actually better than her baseline level of function. She was left up in the chair with all needs met and questions/concerns addressed this morning. She does not appear to be in need of skilled PT services during this hospital stay due to being grossly at/past baseline level of function, however may benefit from at least HHPT screen moving forward to ensure safety in transition back home. PT signing off for now, please reorder if new concerns related to mobility arise- thank you for the referral.     Follow Up Recommendations Home health PT    Equipment Recommendations  None recommended by PT    Recommendations for Other Services       Precautions / Restrictions Precautions Precautions: Fall Restrictions Weight Bearing Restrictions: No      Mobility  Bed Mobility               General bed mobility comments: DNT, received up on bedside commode   Transfers Overall transfer level: Needs assistance Equipment used: None Transfers: Sit to/from Stand;Stand Pivot Transfers Sit to Stand: Supervision Stand pivot transfers: Supervision       General transfer comment: S for all functional transfers, no significant safety or balance concern noted   Ambulation/Gait             General Gait Details: DNT,  non-ambulatory at baseline   Stairs            Wheelchair Mobility    Modified Rankin (Stroke Patients Only)       Balance Overall balance assessment: Mild deficits observed, not formally tested                                           Pertinent Vitals/Pain Pain Assessment: No/denies pain    Home Living Family/patient expects to be discharged to:: Private residence Living Arrangements: Children Available Help at Discharge: Family;Available PRN/intermittently Type of Home: House Home Access: Level entry     Home Layout: Two level;Able to live on main level with bedroom/bathroom Home Equipment: Tub bench;Walker - 2 wheels;Bedside commode;Cane - single point;Electric scooter;Hospital bed Additional Comments: transfers only at baseline, non-ambulatory and uses WC and electric scooter for mobility     Prior Function Level of Independence: Independent with assistive device(s)               Hand Dominance        Extremity/Trunk Assessment   Upper Extremity Assessment Upper Extremity Assessment: Defer to OT evaluation    Lower Extremity Assessment Lower Extremity Assessment: Generalized weakness    Cervical / Trunk Assessment Cervical / Trunk Assessment: Normal  Communication   Communication: No difficulties  Cognition Arousal/Alertness: Awake/alert Behavior During Therapy: WFL for tasks assessed/performed Overall Cognitive Status: Within Functional Limits  for tasks assessed                                        General Comments      Exercises     Assessment/Plan    PT Assessment All further PT needs can be met in the next venue of care  PT Problem List Decreased strength;Decreased mobility;Obesity;Decreased activity tolerance       PT Treatment Interventions      PT Goals (Current goals can be found in the Care Plan section)  Acute Rehab PT Goals Patient Stated Goal: to go home  PT Goal Formulation:  With patient Time For Goal Achievement: 07/08/18 Potential to Achieve Goals: Good    Frequency     Barriers to discharge        Co-evaluation               AM-PAC PT "6 Clicks" Daily Activity  Outcome Measure Difficulty turning over in bed (including adjusting bedclothes, sheets and blankets)?: A Little Difficulty moving from lying on back to sitting on the side of the bed? : A Little Difficulty sitting down on and standing up from a chair with arms (e.g., wheelchair, bedside commode, etc,.)?: A Little Help needed moving to and from a bed to chair (including a wheelchair)?: A Little Help needed walking in hospital room?: Total Help needed climbing 3-5 steps with a railing? : Total 6 Click Score: 14    End of Session   Activity Tolerance: Patient tolerated treatment well Patient left: in chair;with call bell/phone within reach   PT Visit Diagnosis: Muscle weakness (generalized) (M62.81)    Time: 6761-9509 PT Time Calculation (min) (ACUTE ONLY): 12 min   Charges:   PT Evaluation $PT Eval Low Complexity: 1 Low          Deniece Ree PT, DPT, CBIS  Supplemental Physical Therapist [place]    Pager [phone] Acute Rehab Office [phone]

## 2018-06-24 NOTE — Progress Notes (Signed)
RN assumed care of patient at 2330. Pt asleep resting comfortably on her home CPAP machine. Pt in no acute distress. Will continue to monitor. Clint Bolder, RN 06/24/18 11:30 PM

## 2018-06-25 ENCOUNTER — Inpatient Hospital Stay (HOSPITAL_COMMUNITY): Payer: Medicare Other

## 2018-06-25 LAB — CBC
HEMATOCRIT: 32 % — AB (ref 36.0–46.0)
Hemoglobin: 10.1 g/dL — ABNORMAL LOW (ref 12.0–15.0)
MCH: 31.4 pg (ref 26.0–34.0)
MCHC: 31.6 g/dL (ref 30.0–36.0)
MCV: 99.4 fL (ref 78.0–100.0)
PLATELETS: 87 10*3/uL — AB (ref 150–400)
RBC: 3.22 MIL/uL — ABNORMAL LOW (ref 3.87–5.11)
RDW: 15.1 % (ref 11.5–15.5)
WBC: 4 10*3/uL (ref 4.0–10.5)

## 2018-06-25 LAB — BASIC METABOLIC PANEL
Anion gap: 10 (ref 5–15)
BUN: 72 mg/dL — ABNORMAL HIGH (ref 8–23)
CHLORIDE: 96 mmol/L — AB (ref 98–111)
CO2: 34 mmol/L — ABNORMAL HIGH (ref 22–32)
CREATININE: 2.36 mg/dL — AB (ref 0.44–1.00)
Calcium: 9.5 mg/dL (ref 8.9–10.3)
GFR, EST AFRICAN AMERICAN: 22 mL/min — AB (ref >60)
GFR, EST NON AFRICAN AMERICAN: 19 mL/min — AB (ref >60)
Glucose, Bld: 92 mg/dL (ref 70–99)
Potassium: 4 mmol/L (ref 3.5–5.1)
SODIUM: 140 mmol/L (ref 135–145)

## 2018-06-25 MED ORDER — TORSEMIDE 20 MG PO TABS
80.0000 mg | ORAL_TABLET | Freq: Two times a day (BID) | ORAL | Status: DC
  Administered 2018-06-25 – 2018-06-26 (×2): 80 mg via ORAL
  Filled 2018-06-25 (×3): qty 4

## 2018-06-25 NOTE — Care Management Important Message (Signed)
Important Message  Patient Details  Name: [name] [name] MRN: [id number] Date of Birth: Aug 02, 1946   Medicare Important Message Given:  Yes    Orbie Pyo 06/25/2018, 12:37 PM

## 2018-06-25 NOTE — Progress Notes (Signed)
[facility] ROUNDING NOTE   Subjective:   Feels fluid overload is now back to baseline but dyspnea is much worse.  She is having dyspnea with minimal movement.  Nonproductive cough, feels cold but no fevers, chills, night sweats.     Objective:  Vital signs in last 24 hours:  Temp:  [97.8 F (36.6 C)-97.9 F (36.6 C)] 97.8 F (36.6 C) (10/01 0900) Pulse Rate:  [90-107] 107 (10/01 0900) Resp:  [19-20] 19 (10/01 0900) BP: (110-140)/(58-86) 140/86 (10/01 0900) SpO2:  [95 %-98 %] 96 % (10/01 0900) Weight:  [115.5 kg] 115.5 kg (10/01 0600)  Weight change: -3.214 kg Filed Weights   06/21/18 0225 06/24/18 0132 06/25/18 0600  Weight: 118.7 kg 118.7 kg 115.5 kg    Intake/Output: I/O last 3 completed shifts: In: 833 [P.O.:958] Out: 1140 [Urine:1140]   Intake/Output this shift:  No intake/output data recorded.  CVS- RRR holosystolic murmur  Lungs - inc WOB with minimal exertion, LL clear, RL dec BS to mid lung field, no wheezes or rales ABD- BS present soft non-distended EXT-Trace to 1+ edema BL with ruddy scaling discoloration L > R which she says is chronic - improved c/w yesterday   Basic Metabolic Panel: Recent Labs  Lab 06/21/18 1552 06/22/18 0059 06/23/18 0247 06/24/18 0331 06/25/18 0257  NA 140 139 139 141 140  K 3.8 3.5 3.8 4.1 4.0  CL 93* 93* 95* 97* 96*  CO2 30 35* 31 34* 34*  GLUCOSE 80 95 100* 91 92  BUN 85* 79* 76* 73* 72*  CREATININE 2.44* 2.56* 2.48* 2.30* 2.36*  CALCIUM 9.9 9.4 9.7 9.8 9.5  MG 2.7* 2.7*  --   --   --     Liver Function Tests: No results for input(s): AST, ALT, ALKPHOS, BILITOT, PROT, ALBUMIN in the last 168 hours. No results for input(s): LIPASE, AMYLASE in the last 168 hours. No results for input(s): AMMONIA in the last 168 hours.  CBC: Recent Labs  Lab 06/22/18 0059 06/23/18 0247 06/23/18 0748 06/24/18 0331 06/25/18 0257  WBC 5.0 5.8 4.9 4.7 4.0  NEUTROABS  --   --  3.3  --   --   HGB 10.0* 11.1* 10.7*  11.3* 10.1*  HCT 31.1* 34.6* 33.3* 35.1* 32.0*  MCV 97.5 98.9 98.5 99.2 99.4  PLT 107* 98* 111* 112* 87*    Cardiac Enzymes: Recent Labs  Lab 06/21/18 0138 06/21/18 0717 06/21/18 1312  TROPONINI 0.04* 0.05* 0.05*    BNP: Invalid input(s): POCBNP  CBG: No results for input(s): GLUCAP in the last 168 hours.  Microbiology: Results for orders placed or performed during the hospital encounter of 06/20/18  Blood culture (routine x 2)     Status: None (Preliminary result)   Collection Time: 06/21/18 12:49 AM  Result Value Ref Range Status   Specimen Description BLOOD LEFT WRIST  Final   Special Requests   Final    BOTTLES DRAWN AEROBIC AND ANAEROBIC Blood Culture results may not be optimal due to an excessive volume of blood received in culture bottles   Culture   Final    NO GROWTH 4 DAYS Performed at [facility], [place] [address] [place], [place] [address]    Report Status PENDING  Incomplete  Blood culture (routine x 2)     Status: None (Preliminary result)   Collection Time: 06/21/18 12:57 AM  Result Value Ref Range Status   Specimen Description BLOOD RIGHT WRIST  Final   Special Requests  Final    BOTTLES DRAWN AEROBIC AND ANAEROBIC Blood Culture results may not be optimal due to an excessive volume of blood received in culture bottles   Culture   Final    NO GROWTH 4 DAYS Performed at [facility] [address] [place], [place] [address]    Report Status PENDING  Incomplete    Coagulation Studies: Recent Labs    06/23/18 0748  LABPROT 15.3*  INR 1.22    Urinalysis: No results for input(s): COLORURINE, LABSPEC, PHURINE, GLUCOSEU, HGBUR, BILIRUBINUR, KETONESUR, PROTEINUR, UROBILINOGEN, NITRITE, LEUKOCYTESUR in the last 72 hours.  Invalid input(s): APPERANCEUR    Imaging: No results found.   Medications:   . sodium chloride     . allopurinol  100 mg Oral BID  . aspirin  325 mg Oral QPM  . calcitRIOL  0.25 mcg Oral QODAY  . calcium  carbonate  1,250 mg Oral Q lunch  . ferrous sulfate  325 mg Oral QPM  . furosemide  80 mg Intravenous BID  . HYDROcodone-acetaminophen  1 tablet Oral TID  . isosorbide mononitrate  15 mg Oral Daily  . lactose free nutrition  237 mL Oral TID WC  . multivitamin with minerals  1 tablet Oral Daily  . neomycin-bacitracin-polymyxin   Topical Daily  . nystatin   Topical Daily  . polyethylene glycol  17 g Oral QODAY  . potassium chloride  20 mEq Oral BID  . sodium chloride flush  3 mL Intravenous Q12H  . vitamin B-12  500 mcg Oral Daily   sodium chloride, acetaminophen, docusate sodium, Glycerin (Adult), hydrALAZINE, HYDROcodone-acetaminophen, hydrOXYzine, nitroGLYCERIN, sodium chloride flush, zolpidem  Assessment/ Plan:   Chronic renal insufficiency stage IV baseline creatinine 2.5-3.0 with currently stable renal function.  There is no use nonsteroidal inflammatories Cox 2 inhibitors of ACE inhibitors, no IV contrast administration.  She has f/u with Dr. Jimmy Footman in clinic in 3 weeks already arranged.   Decompensated congestive heart failure Lasix has been given intravenously 80 mg IV every 12 hours with good diuretic response. Wt down 7lbs.   Home medications include Demadex 80 mg twice daily and metolazone as needed - will switch to this today.   Dyspnea worsening and out of proportion to volume overload now. R lung dec BS worse than yesterday.  Send for 2v CXR to eval.  ? Effusion. Afebrile and WBC normal.  Hypertension/volume appears adequately controlled with diuretic therapy and current medictions  Anemia and stable no ESA at this time continues oral ferrous sulfate  BMM Calcitrol 0.25 mcg daily she uses calcium carbonate once daily  Mitral regurgitation followed by cardiology according to Dr. Tamala Julian she may be a candidate for mitraClip  Congestive heart failure EF 25%  History of atrial fibrillation is refused anticoagulation  Obstructive sleep apnea continues on BiPAP with home  machine   LOS: 3 Jannifer Hick A @TODAY @12 :59 PM

## 2018-06-25 NOTE — Progress Notes (Addendum)
[facility] Progress Note  Patient: [name] [name] KPT:[id number]   PCP: [name], MD DOB: 02/12/1946   DOA: 06/20/2018   DOS: 06/25/2018   Date of Service: the patient was seen and examined on 06/25/2018  Brief hospital course: Pt. with PMH of CHF, chronic systolic, EF 17%, HTN, GERD, IDA, A. fib not on anticoagulation, morbid obesity, S/P PPM, OSA on BiPAP, CKD stage IV; admitted on 06/20/2018, presented with complaint of shortness of breath, was found to have acute on chronic systolic CHF.  Family requested consulting cardiology as well as nephrology. Currently further plan is continue IV Lasix.  Subjective: Patient tells me that she is breathing better.  No nausea no vomiting.  Although the mere act of going on a bedside commode and coming back to bed she was out of breath unable to talk in full sentences.  Denies any chest pain or abdominal pain no diarrhea or constipation.  Assessment and Plan: 1.  Acute on chronic combined systolic and diastolic CHF. Patient with cough and shortness of breath. Also had weight gain. BNP was 160.  But in patient with CKD as well as obesity the value is not reliable. Patient was more hypoxic than her baseline. Started on IV Lasix 80 mg twice daily with significant improvement in her breathing and continues to feel better. Still does not with her baseline. Swelling in the leg has improved significantly.  Unna boot removed. Continue with IV Lasix for now.  Management per cardiology. PT OT consulted.  Recommend home health. Family requested cardiology consult.  Cardiology consulted appreciate their assistance.  2.  Severe mitral regurgitation. May benefit from mitral clip.  Although calcification of the mitral valve may be a limiting factor. Patient will follow-up with cardiology outpatient.  Appreciate cardiology assistance.  3.  Chronic kidney disease. Stage IV. Renal function stable. Family requested nephrology consult. Consulted.   Outpatient follow-up with nephrology in 3 weeks.  4.  Left great toe injury. Topical ointment.  5.  Obstructive sleep apnea. Continue BiPAP per home regimen.  6.  Concern for pneumonia. Family has concern for pneumonia based on the chest x-ray on admission. With normal white count no leukocytosis and no fever I do not suspect that the patient actually has pneumonia. Alternative explanation is CHF exacerbation.  7.  History of AV node ablation.  S/P pacemaker implant. Tachycardia. Patient remains tachycardic on telemetry.  It is documented as NSVT but patient does not have V. tach. Follows up with EP outpatient. May be a candidate for BiV pacing. Refused to take beta-blockers as she had low blood pressure on this medication in the past. Plan per cardiology.  Diet: Cardiac diet DVT Prophylaxis: subcutaneous Heparin  Advance goals of care discussion: DNR/DNI  Family Communication: no family was present at bedside, at the time of interview.  Discussed with family on 06/22/2018.  Disposition:  Discharge to home depending on cardiology clearance.  Consultants: Cardiology and nephrology at family request Procedures: Echocardiogram   Scheduled Meds: . allopurinol  100 mg Oral BID  . aspirin  325 mg Oral QPM  . calcitRIOL  0.25 mcg Oral QODAY  . calcium carbonate  1,250 mg Oral Q lunch  . ferrous sulfate  325 mg Oral QPM  . HYDROcodone-acetaminophen  1 tablet Oral TID  . isosorbide mononitrate  15 mg Oral Daily  . lactose free nutrition  237 mL Oral TID WC  . multivitamin with minerals  1 tablet Oral Daily  . neomycin-bacitracin-polymyxin   Topical Daily  .  nystatin   Topical Daily  . polyethylene glycol  17 g Oral QODAY  . potassium chloride  20 mEq Oral BID  . sodium chloride flush  3 mL Intravenous Q12H  . torsemide  80 mg Oral BID  . vitamin B-12  500 mcg Oral Daily   Continuous Infusions: . sodium chloride     PRN Meds: sodium chloride, acetaminophen, docusate  sodium, Glycerin (Adult), hydrALAZINE, HYDROcodone-acetaminophen, hydrOXYzine, nitroGLYCERIN, sodium chloride flush, zolpidem Antibiotics: Anti-infectives (From admission, onward)   Start     Dose/Rate Route Frequency Ordered Stop   06/21/18 0100  doxycycline (VIBRAMYCIN) 100 mg in sodium chloride 0.9 % 250 mL IVPB  Status:  Discontinued     100 mg 125 mL/hr over 120 Minutes Intravenous  Once 06/21/18 0048 06/21/18 0119       Objective: Physical Exam: Vitals:   06/24/18 1808 06/25/18 0015 06/25/18 0600 06/25/18 0900  BP: 127/70 (!) 110/58  140/86  Pulse: (!) 102 90  (!) 107  Resp: 20 20  19   Temp: 97.9 F (36.6 C) 97.9 F (36.6 C)  97.8 F (36.6 C)  TempSrc: Oral Oral  Oral  SpO2: 95% 98%  96%  Weight:   115.5 kg   Height:        Intake/Output Summary (Last 24 hours) at 06/25/2018 1507 Last data filed at 06/24/2018 2025 Gross per 24 hour  Intake 358 ml  Output -  Net 358 ml   Filed Weights   06/21/18 0225 06/24/18 0132 06/25/18 0600  Weight: 118.7 kg 118.7 kg 115.5 kg   General: Alert, Awake and Oriented to Time, Place and Person. Appear in mild distress, affect appropriate Eyes: PERRL, Conjunctiva normal ENT: Oral Mucosa clear moist. Neck: difficult to assess  JVD, no Abnormal Mass Or lumps Cardiovascular: S1 and S2 Present, aortic systolic  Murmur, Peripheral Pulses Present Respiratory: normal respiratory effort, Bilateral Air entry equal and Decreased, no use of accessory muscle, basal crackles, no wheezes Abdomen: Bowel Sound present, Soft and no tenderness, no hernia Skin: no redness, no Rash, no induration Extremities: bilateral  Pedal edema, no calf tenderness Neurologic: Grossly no focal neuro deficit. Bilaterally Equal motor strength  Data Reviewed: CBC: Recent Labs  Lab 06/22/18 0059 06/23/18 0247 06/23/18 0748 06/24/18 0331 06/25/18 0257  WBC 5.0 5.8 4.9 4.7 4.0  NEUTROABS  --   --  3.3  --   --   HGB 10.0* 11.1* 10.7* 11.3* 10.1*  HCT 31.1* 34.6*  33.3* 35.1* 32.0*  MCV 97.5 98.9 98.5 99.2 99.4  PLT 107* 98* 111* 112* 87*   Basic Metabolic Panel: Recent Labs  Lab 06/21/18 1552 06/22/18 0059 06/23/18 0247 06/24/18 0331 06/25/18 0257  NA 140 139 139 141 140  K 3.8 3.5 3.8 4.1 4.0  CL 93* 93* 95* 97* 96*  CO2 30 35* 31 34* 34*  GLUCOSE 80 95 100* 91 92  BUN 85* 79* 76* 73* 72*  CREATININE 2.44* 2.56* 2.48* 2.30* 2.36*  CALCIUM 9.9 9.4 9.7 9.8 9.5  MG 2.7* 2.7*  --   --   --     Liver Function Tests: No results for input(s): AST, ALT, ALKPHOS, BILITOT, PROT, ALBUMIN in the last 168 hours. No results for input(s): LIPASE, AMYLASE in the last 168 hours. No results for input(s): AMMONIA in the last 168 hours. Coagulation Profile: Recent Labs  Lab 06/23/18 0748  INR 1.22   Cardiac Enzymes: Recent Labs  Lab 06/21/18 0138 06/21/18 0717 06/21/18 1312  TROPONINI 0.04* 0.05*  0.05*   BNP (last 3 results) No results for input(s): PROBNP in the last 8760 hours. CBG: No results for input(s): GLUCAP in the last 168 hours. Studies: Dg Chest 2 View  Result Date: 06/25/2018 CLINICAL DATA:  Episodes of shortness of breath since being admitted for pneumonia, history non ischemic cardiomyopathy, cervical cancer, atrial fibrillation, CHF, hypertension, GERD, former smoker EXAM: CHEST - 2 VIEW COMPARISON:  06/20/2018 FINDINGS: LEFT subclavian pacemaker with lead projecting over RIGHT ventricle. Enlargement of cardiac silhouette with pulmonary vascular congestion. Mediastinal contours normal. Atherosclerotic calcification aorta. Small to moderate RIGHT pleural effusion and basilar atelectasis. Minimal perihilar edema questioned. No pneumothorax or acute osseous findings. Bones demineralized with glenohumeral degenerative changes bilaterally. IMPRESSION: Enlargement of cardiac silhouette pulmonary vascular congestion and question minimal pulmonary edema. Small to moderate RIGHT pleural effusion and RIGHT basilar atelectasis. Electronically  Signed   By: Lavonia Dana M.D.   On: 06/25/2018 14:25     Time spent: 35 minutes   Author: Berle Mull, MD [facility] Pager: [phone] 06/25/2018 3:07 PM  If 7PM-7AM, please contact night-coverage at [email or web address], password [facility]

## 2018-06-25 NOTE — Progress Notes (Signed)
Progress Note  Patient Name: [name] [name] Date of Encounter: 06/25/2018  Primary Cardiologist: Dorris Carnes, MD  Subjective   Ms. Scoles is seen this evening and was sitting up in the bedside chair feeling well. She was seen with family present and was in good spirits.   Inpatient Medications    Scheduled Meds: . allopurinol  100 mg Oral BID  . aspirin  325 mg Oral QPM  . calcitRIOL  0.25 mcg Oral QODAY  . calcium carbonate  1,250 mg Oral Q lunch  . ferrous sulfate  325 mg Oral QPM  . HYDROcodone-acetaminophen  1 tablet Oral TID  . isosorbide mononitrate  15 mg Oral Daily  . lactose free nutrition  237 mL Oral TID WC  . multivitamin with minerals  1 tablet Oral Daily  . neomycin-bacitracin-polymyxin   Topical Daily  . nystatin   Topical Daily  . polyethylene glycol  17 g Oral QODAY  . potassium chloride  20 mEq Oral BID  . sodium chloride flush  3 mL Intravenous Q12H  . torsemide  80 mg Oral BID  . vitamin B-12  500 mcg Oral Daily   Continuous Infusions: . sodium chloride     PRN Meds: sodium chloride, acetaminophen, docusate sodium, Glycerin (Adult), hydrALAZINE, HYDROcodone-acetaminophen, hydrOXYzine, nitroGLYCERIN, sodium chloride flush, zolpidem   Vital Signs    Vitals:   06/25/18 0015 06/25/18 0600 06/25/18 0900 06/25/18 1600  BP: (!) 110/58  140/86 119/71  Pulse: 90  (!) 107 (!) 101  Resp: 20  19   Temp: 97.9 F (36.6 C)  97.8 F (36.6 C) 97.6 F (36.4 C)  TempSrc: Oral  Oral Oral  SpO2: 98%  96% 96%  Weight:  115.5 kg    Height:       No intake or output data in the 24 hours ending 06/25/18 2257 Filed Weights   06/21/18 0225 06/24/18 0132 06/25/18 0600  Weight: 118.7 kg 118.7 kg 115.5 kg    Physical Exam   Gen: In no acute distress CV: Distant heart sounds due to body habitus, normal rate.  3/6 holosystolic murmur appreciated across the precordium Lungs: Diminished breath sounds R>L lung base Abd: Soft and nontender Ext significant venous  stasis change of the bilateral lower extremities, stasis dermatitis with skin breakdown, most notable on the left lower extremity. Bilateral edema, slightly improved Labs    Chemistry Recent Labs  Lab 06/23/18 0247 06/24/18 0331 06/25/18 0257  NA 139 141 140  K 3.8 4.1 4.0  CL 95* 97* 96*  CO2 31 34* 34*  GLUCOSE 100* 91 92  BUN 76* 73* 72*  CREATININE 2.48* 2.30* 2.36*  CALCIUM 9.7 9.8 9.5  GFRNONAA 18* 20* 19*  GFRAA 21* 23* 22*  ANIONGAP 13 10 10      Hematology Recent Labs  Lab 06/23/18 0748 06/24/18 0331 06/25/18 0257  WBC 4.9 4.7 4.0  RBC 3.38*  3.38* 3.54* 3.22*  HGB 10.7* 11.3* 10.1*  HCT 33.3* 35.1* 32.0*  MCV 98.5 99.2 99.4  MCH 31.7 31.9 31.4  MCHC 32.1 32.2 31.6  RDW 15.0 14.8 15.1  PLT 111* 112* 87*    Cardiac Enzymes Recent Labs  Lab 06/21/18 0138 06/21/18 0717 06/21/18 1312  TROPONINI 0.04* 0.05* 0.05*    Recent Labs  Lab 06/20/18 2328  TROPIPOC 0.00     BNP Recent Labs  Lab 06/20/18 2249  BNP 160.6*     DDimer No results for input(s): DDIMER in the last 168 hours.  Radiology    Dg Chest 2 View  Result Date: 06/25/2018 CLINICAL DATA:  Episodes of shortness of breath since being admitted for pneumonia, history non ischemic cardiomyopathy, cervical cancer, atrial fibrillation, CHF, hypertension, GERD, former smoker EXAM: CHEST - 2 VIEW COMPARISON:  06/20/2018 FINDINGS: LEFT subclavian pacemaker with lead projecting over RIGHT ventricle. Enlargement of cardiac silhouette with pulmonary vascular congestion. Mediastinal contours normal. Atherosclerotic calcification aorta. Small to moderate RIGHT pleural effusion and basilar atelectasis. Minimal perihilar edema questioned. No pneumothorax or acute osseous findings. Bones demineralized with glenohumeral degenerative changes bilaterally. IMPRESSION: Enlargement of cardiac silhouette pulmonary vascular congestion and question minimal pulmonary edema. Small to moderate RIGHT pleural effusion  and RIGHT basilar atelectasis. Electronically Signed   By: Lavonia Dana M.D.   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  - no other changes to cardiovascular meds needed  We will arrange follow up with heart failure and interventional cardiology for a complete discussion about therapies for heart failure and severe mitral regurgitation. Continued diuresis indicated given right pleural effusion.   If she is successful on home diuretic doses, she could consider hospital dismissal. With severe mitral regurgitation, achieving a non-dyspneic state may be challenging, and if she feels well and back to baseline, dismissal with close follow up could be pursued.    For questions or updates, please contact [place] Please consult [email or web address] for contact info under        Signed, Elouise Munroe, MD  06/25/2018, 10:57 PM

## 2018-06-26 LAB — CBC WITH DIFFERENTIAL/PLATELET
Abs Immature Granulocytes: 0 10*3/uL (ref 0.0–0.1)
BASOS PCT: 1 %
Basophils Absolute: 0 10*3/uL (ref 0.0–0.1)
EOS ABS: 0.2 10*3/uL (ref 0.0–0.7)
Eosinophils Relative: 6 %
HEMATOCRIT: 33.1 % — AB (ref 36.0–46.0)
Hemoglobin: 10.3 g/dL — ABNORMAL LOW (ref 12.0–15.0)
Immature Granulocytes: 0 %
LYMPHS ABS: 1 10*3/uL (ref 0.7–4.0)
Lymphocytes Relative: 22 %
MCH: 31.2 pg (ref 26.0–34.0)
MCHC: 31.1 g/dL (ref 30.0–36.0)
MCV: 100.3 fL — AB (ref 78.0–100.0)
MONO ABS: 0.3 10*3/uL (ref 0.1–1.0)
Monocytes Relative: 8 %
Neutro Abs: 2.8 10*3/uL (ref 1.7–7.7)
Neutrophils Relative %: 63 %
PLATELETS: 105 10*3/uL — AB (ref 150–400)
RBC: 3.3 MIL/uL — ABNORMAL LOW (ref 3.87–5.11)
RDW: 15 % (ref 11.5–15.5)
WBC: 4.4 10*3/uL (ref 4.0–10.5)

## 2018-06-26 LAB — CULTURE, BLOOD (ROUTINE X 2)
Culture: NO GROWTH
Culture: NO GROWTH

## 2018-06-26 LAB — RENAL FUNCTION PANEL
ALBUMIN: 3.7 g/dL (ref 3.5–5.0)
Anion gap: 7 (ref 5–15)
BUN: 73 mg/dL — AB (ref 8–23)
CALCIUM: 9.3 mg/dL (ref 8.9–10.3)
CHLORIDE: 99 mmol/L (ref 98–111)
CO2: 35 mmol/L — AB (ref 22–32)
CREATININE: 2.57 mg/dL — AB (ref 0.44–1.00)
GFR calc Af Amer: 20 mL/min — ABNORMAL LOW (ref >60)
GFR, EST NON AFRICAN AMERICAN: 17 mL/min — AB (ref >60)
GLUCOSE: 92 mg/dL (ref 70–99)
Phosphorus: 3.5 mg/dL (ref 2.5–4.6)
Potassium: 4.3 mmol/L (ref 3.5–5.1)
SODIUM: 141 mmol/L (ref 135–145)

## 2018-06-26 MED ORDER — FUROSEMIDE 10 MG/ML IJ SOLN
80.0000 mg | Freq: Every day | INTRAMUSCULAR | Status: DC
  Administered 2018-06-26 – 2018-06-27 (×2): 80 mg via INTRAVENOUS
  Filled 2018-06-26 (×3): qty 8

## 2018-06-26 NOTE — Progress Notes (Signed)
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  Subjective: She is feeling better, appreciates that she is close to baseline.  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  She was transitioned to torsemide on 10/1, and continues to diurese well -d/w general cardiology, heart failure team was consulted, awaiting input  Severe mitral regurgitation -?  Benefiting from mitral clip, appreciate cardiology assistance  Chronic kidney disease stage IV -Nephrology consulted and follows patient.  Her kidney function remained stable with diuresis and seems to be tolerating torsemide well -She has outpatient follow-up with nephrology  OSA -Continue BiPAP at home regimen  Left great toe injury -Continue topical ointment  Concern for pneumonia -Family had concerns for pneumonia based on the chest x-ray on  admission, however she has no leukocytosis, afebrile, no productive cough, now monitor off antibiotics  History of AV node ablation status post pacemaker -Stable, plans per cardiology  Liver cirrhosis -This appears to be stable, has slight thrombocytopenia  Scheduled Meds: . allopurinol  100 mg Oral BID  . aspirin  325 mg Oral QPM  . calcitRIOL  0.25 mcg Oral QODAY  . calcium carbonate  1,250 mg Oral Q lunch  . ferrous sulfate  325 mg Oral QPM  . furosemide  80 mg Intravenous Daily  . HYDROcodone-acetaminophen  1 tablet Oral TID  . isosorbide mononitrate  15 mg Oral Daily  . lactose free nutrition  237 mL Oral TID WC  . multivitamin with minerals  1 tablet Oral Daily  . neomycin-bacitracin-polymyxin   Topical Daily  . nystatin   Topical Daily  . polyethylene glycol  17 g Oral QODAY  . potassium chloride  20 mEq Oral BID  . sodium chloride flush  3 mL Intravenous Q12H  . torsemide  80 mg Oral BID  . vitamin B-12  500 mcg Oral Daily   Continuous Infusions: . sodium chloride     PRN Meds:.sodium chloride, acetaminophen, docusate sodium, Glycerin (Adult), hydrALAZINE, HYDROcodone-acetaminophen, hydrOXYzine, nitroGLYCERIN, sodium chloride flush, zolpidem  DVT prophylaxis: heparin Code Status: DNR Family Communication: no family at bedside Disposition Plan: home when cleared by cardiology   Consultants:   Cardiology  Nephrology   Procedures:   2D echo:  Impressions: - Akinesis of the distal septum and apex; overall mild to moderate LV dysfunction; severe LVE; mild AS (mean gradient 14 mmHg); severe MR; severe biatrial enlargement; moderate TR with mild pulmonary hypertension.  Antimicrobials:  None  Objective: Vitals:   06/25/18 1600 06/26/18 0010 06/26/18 0500 06/26/18 0751  BP: 119/71 128/79  118/75  Pulse: (!) 101 96  98  Resp:  15    Temp: 97.6 F (36.4 C) 98 F (36.7 C)  98 F (36.7 C)  TempSrc: Oral Oral  Oral  SpO2: 96% 97%  96%  Weight:   114.7 kg    Height:       No intake or output data in the 24 hours ending 06/26/18 1508 Filed Weights   06/24/18 0132 06/25/18 0600 06/26/18 0500  Weight: 118.7 kg 115.5 kg 114.7 kg    Examination:  Constitutional: NAD Eyes: lids and conjunctivae normal ENMT: Mucous membranes are moist. Neck: normal, supple Respiratory: clear to auscultation bilaterally, no wheezing, no crackles.  Tachypnea Cardiovascular: Regular rate and rhythm, 3/6 SEM. Trace LE edema.  Chronic venous stasis changes bilaterally 2+ pedal pulses.   Abdomen: no tenderness. Bowel sounds positive.  Musculoskeletal: no clubbing / cyanosis.   Skin: no rashes seen Neurologic: CN 2-12 grossly intact. Strength 5/5 in all 4.  Psychiatric: Normal judgment and insight. Alert and oriented x 3. Normal mood.    Data Reviewed: I have independently reviewed following labs and imaging studies   CBC: Recent Labs  Lab 06/23/18 0247 06/23/18 0748 06/24/18 0331 06/25/18 0257 06/26/18 0339  WBC 5.8 4.9 4.7 4.0 4.4  NEUTROABS  --  3.3  --   --  2.8  HGB 11.1* 10.7* 11.3* 10.1* 10.3*  HCT 34.6* 33.3* 35.1* 32.0* 33.1*  MCV 98.9 98.5 99.2 99.4 100.3*  PLT 98* 111* 112* 87* 831*   Basic Metabolic Panel: Recent Labs  Lab 06/21/18 1552 06/22/18 0059 06/23/18 0247 06/24/18 0331 06/25/18 0257 06/26/18 0339  NA 140 139 139 141 140 141  K 3.8 3.5 3.8 4.1 4.0 4.3  CL 93* 93* 95* 97* 96* 99  CO2 30 35* 31 34* 34* 35*  GLUCOSE 80 95 100* 91 92 92  BUN 85* 79* 76* 73* 72* 73*  CREATININE 2.44* 2.56* 2.48* 2.30* 2.36* 2.57*  CALCIUM 9.9 9.4 9.7 9.8 9.5 9.3  MG 2.7* 2.7*  --   --   --   --   PHOS  --   --   --   --   --  3.5   GFR: Estimated Creatinine Clearance: 25.1 mL/min (A) (by C-G formula based on SCr of 2.57 mg/dL (H)). Liver Function Tests: Recent Labs  Lab 06/26/18 0339  ALBUMIN 3.7   No results for input(s): LIPASE, AMYLASE in the last 168 hours. No results for input(s): AMMONIA in the last 168 hours. Coagulation  Profile: Recent Labs  Lab 06/23/18 0748  INR 1.22   Cardiac Enzymes: Recent Labs  Lab 06/21/18 0138 06/21/18 0717 06/21/18 1312  TROPONINI 0.04* 0.05* 0.05*   BNP (last 3 results) No results for input(s): PROBNP in the last 8760 hours. HbA1C: No results for input(s): HGBA1C in the last 72 hours. CBG: No results for input(s): GLUCAP in the last 168 hours. Lipid Profile: No results for input(s): CHOL, HDL, LDLCALC, TRIG, CHOLHDL, LDLDIRECT in the last 72 hours. Thyroid Function Tests: No results for input(s): TSH, T4TOTAL, FREET4, T3FREE, THYROIDAB in the last 72 hours. Anemia Panel: No results for input(s): VITAMINB12, FOLATE, FERRITIN, TIBC, IRON, RETICCTPCT in the last 72 hours. Urine analysis:    Component Value Date/Time   COLORURINE YELLOW 12/01/2016 1224   APPEARANCEUR CLEAR 12/01/2016 1224   LABSPEC 1.009 12/01/2016 1224   PHURINE 6.0 12/01/2016  [place] 12/01/2016 1224   HGBUR SMALL (A) 12/01/2016 [place] 12/01/2016 1224   KETONESUR NEGATIVE 12/01/2016 1224   PROTEINUR NEGATIVE 12/01/2016 1224   UROBILINOGEN 0.2 03/06/2013 2035   NITRITE NEGATIVE 12/01/2016 1224   LEUKOCYTESUR LARGE (A) 12/01/2016 1224   Sepsis Labs: Invalid input(s): PROCALCITONIN, LACTICIDVEN  Recent Results (from the past 240 hour(s))  Blood culture (routine x 2)     Status: None   Collection Time: 06/21/18 12:49 AM  Result Value Ref Range Status   Specimen Description BLOOD LEFT WRIST  Final   Special Requests   Final    BOTTLES DRAWN AEROBIC AND ANAEROBIC Blood Culture results may not be optimal due to an excessive volume of blood received in culture bottles   Culture   Final    NO GROWTH 5 DAYS Performed at [facility] [address], [place], [place] [address]    Report Status 06/26/2018 FINAL  Final  Blood culture (routine x 2)     Status: None   Collection Time: 06/21/18 12:57 AM  Result Value Ref Range Status   Specimen  Description BLOOD RIGHT WRIST  Final   Special Requests   Final    BOTTLES DRAWN AEROBIC AND ANAEROBIC Blood Culture results may not be optimal due to an excessive volume of blood received in culture bottles   Culture   Final    NO GROWTH 5 DAYS Performed at [facility], [place] [address] [place], [place] [address]    Report Status 06/26/2018 FINAL  Final      Radiology Studies: Dg Chest 2 View  Result Date: 06/25/2018 CLINICAL DATA:  Episodes of shortness of breath since being admitted for pneumonia, history non ischemic cardiomyopathy, cervical cancer, atrial fibrillation, CHF, hypertension, GERD, former smoker EXAM: CHEST - 2 VIEW COMPARISON:  06/20/2018 FINDINGS: LEFT subclavian pacemaker with lead projecting over RIGHT ventricle. Enlargement of cardiac silhouette with pulmonary vascular congestion. Mediastinal contours normal. Atherosclerotic calcification aorta. Small to moderate RIGHT pleural effusion and basilar atelectasis. Minimal perihilar edema questioned. No pneumothorax or acute osseous findings. Bones demineralized with glenohumeral degenerative changes bilaterally. IMPRESSION: Enlargement of cardiac silhouette pulmonary vascular congestion and question minimal pulmonary edema. Small to moderate RIGHT pleural effusion and RIGHT basilar atelectasis. Electronically Signed   By: Lavonia Dana M.D.   On: 06/25/2018 14:25     Marzetta Board, MD, PhD [facility] Pager [phone]  If 7PM-7AM, please contact night-coverage [email or web address] Password [email or web address] 06/26/2018, 3:08 PM

## 2018-06-26 NOTE — Progress Notes (Signed)
Wears ho,e BiPAP. RT filled with sterile water. Pt can place on self.

## 2018-06-26 NOTE — Consult Note (Addendum)
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   [name] [name] is a 72 y.o. female with a history of morbid obesity, afib s/p AV ablation and St Jude PPM, chronic combined HF, CKD IV, MR, TR, pulmonary HTN, OSA on BiPAP, GERD, gout, iron deficiency anemia.  Admitted 06/20/18 with progressive SOB and CP. She had BLE edema, orthopnea, and dry cough. Pertinent admission labs include: K 3.5, creatinine 2.72, BNP 160.6, troponin 0.04 > 0.05 > 0.05, WBC 6.3, hemoglobin 11.0  CXR: New confluent opacities at the right lung base obscuring the right heart border and right hemidiaphragm suspicious for pulmonary consolidation/pneumonia. Superimposed pleural fluid is suspected   Repeat echo as below with severe MR. She has been diuresing with IV lasix and has now transitioned to torsemide 80 mg BID. Weight is down 9 lbs from admit. Creatinine is 2.57 today.   She feels better, but is still SOB on exertion and orthopneic.   Echo 06/21/18: - Left ventricle: The cavity size was severely dilated. Wall   thickness was increased in a pattern of mild LVH. Systolic   function was mildly to moderately reduced. The estimated ejection   fraction was in the range of 40% to 45%. There is akinesis of the   mid-apicalanteroseptal and apical myocardium. The study is not   technically sufficient to allow evaluation of LV diastolic   function. - Aortic valve: Valve mobility was restricted. There was mild   stenosis. - Mitral valve: Calcified annulus. There was severe regurgitation. - Left atrium: The atrium was severely dilated. - Right atrium: The atrium was severely dilated. - Tricuspid valve: There was moderate regurgitation. - Pulmonary arteries: Systolic  pressure was mildly increased. PA   peak pressure: 43 mm Hg (S).  Review of Systems: [y] = yes, [ ]  = no   General: Weight gain [ ] ; Weight loss [ ] ; Anorexia [ ] ; Fatigue [ ] ; Fever [ ] ; Chills [ ] ; Weakness [ ]   Cardiac: Chest pain/pressure [ ] ; Resting SOB [ ] ; Exertional SOB [name] ]; Orthopnea [name] ]; Pedal Edema [name] ]; Palpitations [ ] ; Syncope [ ] ; Presyncope [ ] ; Paroxysmal nocturnal dyspnea[ ]   Pulmonary: Cough [name] ]; Wheezing[ ] ; Hemoptysis[ ] ; Sputum [ ] ; Snoring [ ]   GI: Vomiting[ ] ; Dysphagia[ ] ; Melena[ ] ; Hematochezia [ ] ; Heartburn[ ] ; Abdominal pain [ ] ; Constipation [ ] ; Diarrhea [ ] ; BRBPR [ ]   GU: Hematuria[ ] ; Dysuria [ ] ; Nocturia[ ]   Vascular: Pain in legs with walking [ ] ; Pain in feet with lying flat [ ] ; Non-healing sores [ ] ; Stroke [ ] ; TIA [ ] ; Slurred speech [ ] ;  Neuro: Headaches[ ] ; Vertigo[ ] ; Seizures[ ] ; Paresthesias[ ] ;Blurred vision [ ] ; Diplopia [ ] ; Vision changes [ ]   Ortho/Skin: Arthritis [name] ]; Joint pain [name] ]; Muscle pain [ ] ; Joint swelling [ ] ; Back Pain [ ] ; Rash [ ]   Psych: Depression[ ] ; Anxiety[ ]   Heme: Bleeding problems [ y]; Clotting disorders [ ] ; Anemia [ ]   Endocrine: Diabetes [ ] ; Thyroid dysfunction[ ]   Home Medications Prior to Admission medications   Medication Sig Start Date End Date Taking? Authorizing Provider  allopurinol (ZYLOPRIM) 100 MG tablet Take 100 mg by mouth  2 (two) times daily.   Yes [provider]  aspirin 325 MG tablet Take 325 mg by mouth every evening.    Yes [provider]  calcitRIOL (ROCALTROL) 0.25 MCG capsule Take 0.25 mcg by mouth every other day.    Yes [provider]  calcium carbonate (OS-CAL) 600 MG tablet Take 600 mg by mouth daily.   Yes [provider]  COD LIVER OIL PO Take 1 tablet by mouth daily.     Yes [provider]  docusate sodium (COLACE) 100 MG capsule Take 200 mg by mouth daily as needed for mild constipation.    Yes [provider]  feeding  supplement (BOOST HIGH PROTEIN) LIQD Take 1 Container by mouth daily.   Yes [provider]  ferrous sulfate 325 (65 FE) MG tablet Take 325 mg by mouth every evening.    Yes [provider]  Glycerin, Adult, 2.1 g SUPP Place 1 suppository rectally daily as needed for moderate constipation. 02/22/17  Yes Barrett, Evelene Croon, PA-C  HYDROcodone-acetaminophen (NORCO) 10-325 MG per tablet Take 1 tablet by mouth 3 (three) times daily.    Yes [provider]  isosorbide mononitrate (IMDUR) 30 MG 24 hr tablet Take 0.5 tablets (15 mg total) by mouth daily. 12/10/17  Yes Fay Records, MD  metolazone (ZAROXOLYN) 2.5 MG tablet Take 1 tablet (2.5 mg total) by mouth as needed. Take once as instructed. Patient taking differently: Take 2.5 mg by mouth daily as needed (fluid). Take once as instructed.  05/30/18  Yes Evans Lance, MD  Multiple Vitamin (MULTIVITAMIN) tablet Take 1 tablet by mouth daily.   Yes [provider]  nystatin (MYCOSTATIN/NYSTOP) powder Apply 1 application topically daily.  02/05/17  Yes [provider]  torsemide (DEMADEX) 20 MG tablet TAKE 4 TABLETS (80MG) BY MOUTH IN THE MORNING AND EACH EVENING DAILY. 06/12/18  Yes Bhagat, Bhavinkumar, PA  vitamin B-12 (CYANOCOBALAMIN) 500 MCG tablet Take 500 mcg by mouth daily.   Yes [provider]  white petrolatum (VASELINE) GEL Apply 1 application topically daily as needed for dry skin.   Yes [provider]  nystatin-triamcinolone ointment (MYCOLOG) Apply topically 2 (two) times daily. Patient not taking: Reported on 06/21/2018 02/22/17   Barrett, Felisa Bonier    Past Medical History: Past Medical History:  Diagnosis Date  . Anemia   . Atrial fibrillation ([place])   . Cataract   . Cervical cancer ([place]) 1991   s/p hysterectomy  . Chronic cellulitis   . Chronic combined systolic and diastolic congestive heart failure, NYHA class 2 (HCC)    LVEF 25-30% with restrictive diastolic filling    . Degenerative joint disease   . Diverticulitis   . Essential hypertension   . GERD (gastroesophageal reflux disease)   . Gout   . Kidney stones   . Morbid obesity ([place])   . NICM (nonischemic cardiomyopathy) ([place]) 02/22/2017  . Osteoarthritis   . Permanent atrial fibrillation ([place]) 05/04/2009   Qualifier: History of  By: Quentin Cornwall CMA, Janett Billow    . 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   Qualifier: Diagnosis of  By: Lovena Le, MD, [facility], Binnie Kand   . Sinoatrial node dysfunction [facility])    Status post PPM - Dr. Lovena Le  . Sleep apnea    CPAP    Past Surgical History: Past Surgical History:  Procedure Laterality Date  . ABDOMINAL HYSTERECTOMY  1991  . COLONOSCOPY  1998   one polyp per patient  .  EYE SURGERY    . PACEMAKER INSERTION  Nov 2000   St Jude with revision in 2011  . PERCUTANEOUS NEPHROLITHOTOMY  April 2012    Family History: Family History  Problem Relation Age of Onset  . Heart attack Father   . Stroke Mother   . Diabetes Sister   . Colon cancer Other        seven family members  . Liver disease Neg Hx   . Other Neg Hx     Social History: Social History   Socioeconomic History  . Marital status: Widowed    Spouse name: Not on file  . Number of children: Not on file  . Years of education: Not on file  . Highest education level: Not on file  Occupational History  . Not on file  Social Needs  . Financial resource strain: Not on file  . Food insecurity:    Worry: Not on file    Inability: Not on file  . Transportation needs:    Medical: Not on file    Non-medical: Not on file  Tobacco Use  . Smoking status: Former Smoker    Packs/day: 1.00    Types: Cigarettes    Last attempt to quit: 09/26/1979    Years since quitting: 38.7  . Smokeless tobacco: Never Used  Substance and Sexual Activity  . Alcohol use: No  . Drug use: No  . Sexual activity: Never  Lifestyle  . Physical activity:    Days per week: Not on file    Minutes per session:  Not on file  . Stress: Not on file  Relationships  . Social connections:    Talks on phone: Not on file    Gets together: Not on file    Attends religious service: Not on file    Active member of club or organization: Not on file    Attends meetings of clubs or organizations: Not on file    Relationship status: Not on file  Other Topics Concern  . Not on file  Social History Narrative  . Not on file    Allergies:  Allergies  Allergen Reactions  . Cephalexin Shortness Of Breath    sob  . Codeine Anaphylaxis    REACTION: throat swelling  . Contrast Media [Iodinated Diagnostic Agents] Other (See Comments)    Patient states "I have chronic kidney disease so the doctor said no dye in my veins."  . Peppermint Flavor Shortness Of Breath  . Prednisone Anaphylaxis, Shortness Of Breath and Swelling    REACTION: swelling, S.O.B.  . Tape Other (See Comments)    States plastic tape blisters her skin  . Ciprofloxacin Hives  . Latex Itching and Rash  . Penicillins Hives  . Coreg [Carvedilol] Other (See Comments)    Beta Blockers cause her organs to shut down per patient    Objective:    Vital Signs:   Temp:  [97.6 F (36.4 C)-98 F (36.7 C)] 98 F (36.7 C) (10/02 0751) Pulse Rate:  [96-101] 98 (10/02 0751) Resp:  [15] 15 (10/02 0010) BP: (118-128)/(71-79) 118/75 (10/02 0751) SpO2:  [96 %-97 %] 96 % (10/02 0751) Weight:  [114.7 kg] 114.7 kg (10/02 0500) Last BM Date: 06/25/18  Weight change: Filed Weights   06/24/18 0132 06/25/18 0600 06/26/18 0500  Weight: 118.7 kg 115.5 kg 114.7 kg    Intake/Output:  No intake or output data in the 24 hours ending 06/26/18 1439    Physical Exam    General:  Obese. No resp difficulty HEENT: normal Neck: supple. JVP to jaw with prominent v waves. Carotids 2+ bilat; no bruits. No lymphadenopathy or thyromegaly appreciated. Cor: PMI nondisplaced. Regular rate & rhythm. 3/6 TR 3/6 MR Lungs: clear Abdomen: soft, nontender,  nondistended. No hepatosplenomegaly. No bruits or masses. Good bowel sounds. Extremities: no cyanosis, clubbing, rash, BLE 2+ edema Neuro: alert & orientedx3, cranial nerves grossly intact. moves all 4 extremities w/o difficulty. Affect pleasant   Telemetry   Vpaced 90s  EKG    EKG 06/20/18: NSR 91 bpm with RBBB (190 ms). ?Vpaced  Labs   Basic Metabolic Panel: Recent Labs  Lab 06/21/18 1552 06/22/18 0059 06/23/18 0247 06/24/18 0331 06/25/18 0257 06/26/18 0339  NA 140 139 139 141 140 141  K 3.8 3.5 3.8 4.1 4.0 4.3  CL 93* 93* 95* 97* 96* 99  CO2 30 35* 31 34* 34* 35*  GLUCOSE 80 95 100* 91 92 92  BUN 85* 79* 76* 73* 72* 73*  CREATININE 2.44* 2.56* 2.48* 2.30* 2.36* 2.57*  CALCIUM 9.9 9.4 9.7 9.8 9.5 9.3  MG 2.7* 2.7*  --   --   --   --   PHOS  --   --   --   --   --  3.5    Liver Function Tests: Recent Labs  Lab 06/26/18 0339  ALBUMIN 3.7   No results for input(s): LIPASE, AMYLASE in the last 168 hours. No results for input(s): AMMONIA in the last 168 hours.  CBC: Recent Labs  Lab 06/23/18 0247 06/23/18 0748 06/24/18 0331 06/25/18 0257 06/26/18 0339  WBC 5.8 4.9 4.7 4.0 4.4  NEUTROABS  --  3.3  --   --  2.8  HGB 11.1* 10.7* 11.3* 10.1* 10.3*  HCT 34.6* 33.3* 35.1* 32.0* 33.1*  MCV 98.9 98.5 99.2 99.4 100.3*  PLT 98* 111* 112* 87* 105*    Cardiac Enzymes: Recent Labs  Lab 06/21/18 0138 06/21/18 0717 06/21/18 1312  TROPONINI 0.04* 0.05* 0.05*    BNP: BNP (last 3 results) Recent Labs    06/20/18 2249  BNP 160.6*    ProBNP (last 3 results) No results for input(s): PROBNP in the last 8760 hours.   CBG: No results for input(s): GLUCAP in the last 168 hours.  Coagulation Studies: No results for input(s): LABPROT, INR in the last 72 hours.   Imaging    No results found.   Medications:     Current Medications: . allopurinol  100 mg Oral BID  . aspirin  325 mg Oral QPM  . calcitRIOL  0.25 mcg Oral QODAY  . calcium carbonate   1,250 mg Oral Q lunch  . ferrous sulfate  325 mg Oral QPM  . HYDROcodone-acetaminophen  1 tablet Oral TID  . isosorbide mononitrate  15 mg Oral Daily  . lactose free nutrition  237 mL Oral TID WC  . multivitamin with minerals  1 tablet Oral Daily  . neomycin-bacitracin-polymyxin   Topical Daily  . nystatin   Topical Daily  . polyethylene glycol  17 g Oral QODAY  . potassium chloride  20 mEq Oral BID  . sodium chloride flush  3 mL Intravenous Q12H  . torsemide  80 mg Oral BID  . vitamin B-12  500 mcg Oral Daily     Infusions: . sodium chloride         Patient Profile   SHERILEE SMOTHERMAN is a 72 y.o. female with a history of morbid obesity, afib s/p AV ablation and  St Jude PPM, chronic combined HF, CKD IV, MR, TR, pulmonary HTN, OSA on BiPAP, GERD, gout, iron deficiency anemia.  Admitted 06/20/18 with CP and SOB.   Assessment/Plan   1. Acute on chronic combined HF - Echo 06/21/18: EF 40-45%, LV severely dilated, mild LVH, akinesis of mid-apicalanteroseptal and apical myocardium, mild AS, severe MR, LA and RA severely dilated, moderate TR, PA peak pressure 42 mmHg - Volume status remains elevated - Stop torsemide. Give 80 mg IV lasix x1. Monitor renal function closely.  - Continue hydral/imdur - No spiro with CKD - No BB with volume overload.  2. Severe MR  - Severe MR on echo 06/21/18 (mild on echo 10/2016) - Will need TEE and R/LHC to evaluate for mitra clip candidacy. She would like to discuss with her daughter and decide. Will plan to do these Friday if she is interested in going forward.  3. Afib s/p AV ablation and PPM - Not on AC  4. OSA - Wears BiPAP qHS  5. CKD 3 - Unclear baseline, seems to be ~2.5. Creatinine 2.57 this morning. - Monitor daily BMET  Medication concerns reviewed with patient and pharmacy team. Barriers identified: none at this time.   Length of Stay: [place], NP  06/26/2018, 2:39 PM  Advanced Heart Failure Team Pager [phone]  (M-F; 7a - 4p)  Please contact [facility] for night-coverage after hours (4p -7a ) and weekends on [email or web address]  Patient seen and examined with the above-signed Advanced Practice Provider and/or Housestaff. I personally reviewed laboratory data, imaging studies and relevant notes. I independently examined the patient and formulated the important aspects of the plan. I have edited the note to reflect any of my changes or salient points. I have personally discussed the plan with the patient and/or family.  Complicated situation. 72 y/o woman from [place] with h/o obesity, OSA, CKD 3-4, chronic AF s/p AV nodea ablation and chronic diastolic HF. Has struggled with HF for long time but she says she usually turns around quickly with several doses of IV lasix. Now admitted with more refractory volume overload. Echo with 40-45% with likely restrictive CM with massive biatrial enlargement and severe MR/TR. Mitral valve mildly thickened with perhaps minimal MS but otherwise MV looks normal and appears to be more functional MR. At baseline she uses a wheelchair and electric scooter to get around but says she can do anything she wants to do and even works in her garden some.   We had long talk about the possibility of MitraClip evaluation and she is willing to consider (with some reservations) but wants to discuss with her children first. I do think this may be the only option for some symptomatic relief.   We will tentatively plan RHC and TEE on Friday pending her response. Will d/w Structural heart team.   Case and images reviewed personally with Dr. Margaretann Loveless.   Glori Bickers, MD  5:29 PM

## 2018-06-26 NOTE — Care Management Note (Addendum)
Case Management Note  Patient Details  Name: [name] [name] MRN: [id number] Date of Birth: 02-Jul-1946  Subjective/Objective:     From home, uses bipap at night at home, presents with acute/chronic combined sys/diast chf, permanent afib, chest pain, le edema.  On iv lasix, will need pt/ot eval.  10/2 Tomi Bamberger RN, BSN - per pt eval rec HHPT, patient lives in [place] she states she has worked with [facility] before and would like to have [place] with them again.  Will need HHPT orders to face for referral to [facility] [phone].       10/4 Tomi Bamberger RN, BSN -NCM faxed HHPT orders to [facility] .    10/8 Tomi Bamberger RN, BSN- NCM spoke with Dianna at [facility] and notified her patient is being discharged today.  She states soc will begin Wed or Thur.     1320 Tomi Bamberger RN, BSN - Patient daughter in room states she does not want [facility], she wants [facility],  Or [facility].  NCM contacted [facility] to cancel referral and contacted [facility], they do not do HHPT.  NCM contacted [facility] at [phone] and faxed referral to [phone].   1323 Tomi Bamberger RN, BSN - received call from daughter stating they want to stay with [facility], NCM called [facility] back and informed them that patient wants to stay with them.  NCM called [facility] and cancled the referral.              Action/Plan: Will  Need HHPT order with face to face and fax to [facility] prior to dc.  Expected Discharge Date:                  Expected Discharge Plan:  [place]  In-House Referral:     Discharge planning Services  CM Consult  Post Acute Care Choice:  Home Health Choice offered to:  Patient  DME Arranged:    DME Agency:     HH Arranged:  PT HH Agency:  [facility]  Status of Service:  In process, will continue to follow  If discussed at Long Length of Stay Meetings, dates discussed:    Additional Comments:  Zenon Mayo, RN 06/26/2018,  2:26 PM

## 2018-06-26 NOTE — Progress Notes (Signed)
[facility] ROUNDING NOTE   Subjective:   Feels back to baseline almost.  I/os not documented yesterday but wt down 2lbs more on PO home dose diuretic.  She feels ready for discharge.   Objective:  Vital signs in last 24 hours:  Temp:  [97.6 F (36.4 C)-98 F (36.7 C)] 98 F (36.7 C) (10/02 0751) Pulse Rate:  [96-101] 98 (10/02 0751) Resp:  [15] 15 (10/02 0010) BP: (118-128)/(71-79) 118/75 (10/02 0751) SpO2:  [96 %-97 %] 96 % (10/02 0751) Weight:  [114.7 kg] 114.7 kg (10/02 0500)  Weight change: -0.816 kg Filed Weights   06/24/18 0132 06/25/18 0600 06/26/18 0500  Weight: 118.7 kg 115.5 kg 114.7 kg    Intake/Output: I/O last 3 completed shifts: In: 118 [P.O.:118] Out: -    Intake/Output this shift:  No intake/output data recorded.  CVS- RRR holosystolic murmur  Lungs - normal WOB today, LL clear, RL dec BS to mid lung field, no wheezes or rales ABD- BS present soft non-distended EXT-Trace to 1+ edema BL with ruddy scaling discoloration L > R which she says is chronic - improved c/w yesterday   Basic Metabolic Panel: Recent Labs  Lab 06/21/18 1552 06/22/18 0059 06/23/18 0247 06/24/18 0331 06/25/18 0257 06/26/18 0339  NA 140 139 139 141 140 141  K 3.8 3.5 3.8 4.1 4.0 4.3  CL 93* 93* 95* 97* 96* 99  CO2 30 35* 31 34* 34* 35*  GLUCOSE 80 95 100* 91 92 92  BUN 85* 79* 76* 73* 72* 73*  CREATININE 2.44* 2.56* 2.48* 2.30* 2.36* 2.57*  CALCIUM 9.9 9.4 9.7 9.8 9.5 9.3  MG 2.7* 2.7*  --   --   --   --   PHOS  --   --   --   --   --  3.5    Liver Function Tests: Recent Labs  Lab 06/26/18 0339  ALBUMIN 3.7   No results for input(s): LIPASE, AMYLASE in the last 168 hours. No results for input(s): AMMONIA in the last 168 hours.  CBC: Recent Labs  Lab 06/23/18 0247 06/23/18 0748 06/24/18 0331 06/25/18 0257 06/26/18 0339  WBC 5.8 4.9 4.7 4.0 4.4  NEUTROABS  --  3.3  --   --  2.8  HGB 11.1* 10.7* 11.3* 10.1* 10.3*  HCT 34.6* 33.3* 35.1*  32.0* 33.1*  MCV 98.9 98.5 99.2 99.4 100.3*  PLT 98* 111* 112* 87* 105*    Cardiac Enzymes: Recent Labs  Lab 06/21/18 0138 06/21/18 0717 06/21/18 1312  TROPONINI 0.04* 0.05* 0.05*    BNP: Invalid input(s): POCBNP  CBG: No results for input(s): GLUCAP in the last 168 hours.  Microbiology: Results for orders placed or performed during the hospital encounter of 06/20/18  Blood culture (routine x 2)     Status: None   Collection Time: 06/21/18 12:49 AM  Result Value Ref Range Status   Specimen Description BLOOD LEFT WRIST  Final   Special Requests   Final    BOTTLES DRAWN AEROBIC AND ANAEROBIC Blood Culture results may not be optimal due to an excessive volume of blood received in culture bottles   Culture   Final    NO GROWTH 5 DAYS Performed at [facility], [place] [address] [place], [place] [address]    Report Status 06/26/2018 FINAL  Final  Blood culture (routine x 2)     Status: None   Collection Time: 06/21/18 12:57 AM  Result Value Ref Range Status   Specimen  Description BLOOD RIGHT WRIST  Final   Special Requests   Final    BOTTLES DRAWN AEROBIC AND ANAEROBIC Blood Culture results may not be optimal due to an excessive volume of blood received in culture bottles   Culture   Final    NO GROWTH 5 DAYS Performed at [facility], [place] [address] [place], [place] [address]    Report Status 06/26/2018 FINAL  Final    Coagulation Studies: No results for input(s): LABPROT, INR in the last 72 hours.  Urinalysis: No results for input(s): COLORURINE, LABSPEC, PHURINE, GLUCOSEU, HGBUR, BILIRUBINUR, KETONESUR, PROTEINUR, UROBILINOGEN, NITRITE, LEUKOCYTESUR in the last 72 hours.  Invalid input(s): APPERANCEUR    Imaging: Dg Chest 2 View  Result Date: 06/25/2018 CLINICAL DATA:  Episodes of shortness of breath since being admitted for pneumonia, history non ischemic cardiomyopathy, cervical cancer, atrial fibrillation, CHF, hypertension, GERD, former smoker  EXAM: CHEST - 2 VIEW COMPARISON:  06/20/2018 FINDINGS: LEFT subclavian pacemaker with lead projecting over RIGHT ventricle. Enlargement of cardiac silhouette with pulmonary vascular congestion. Mediastinal contours normal. Atherosclerotic calcification aorta. Small to moderate RIGHT pleural effusion and basilar atelectasis. Minimal perihilar edema questioned. No pneumothorax or acute osseous findings. Bones demineralized with glenohumeral degenerative changes bilaterally. IMPRESSION: Enlargement of cardiac silhouette pulmonary vascular congestion and question minimal pulmonary edema. Small to moderate RIGHT pleural effusion and RIGHT basilar atelectasis. Electronically Signed   By: Lavonia Dana M.D.   On: 06/25/2018 14:25     Medications:   . sodium chloride     . allopurinol  100 mg Oral BID  . aspirin  325 mg Oral QPM  . calcitRIOL  0.25 mcg Oral QODAY  . calcium carbonate  1,250 mg Oral Q lunch  . ferrous sulfate  325 mg Oral QPM  . HYDROcodone-acetaminophen  1 tablet Oral TID  . isosorbide mononitrate  15 mg Oral Daily  . lactose free nutrition  237 mL Oral TID WC  . multivitamin with minerals  1 tablet Oral Daily  . neomycin-bacitracin-polymyxin   Topical Daily  . nystatin   Topical Daily  . polyethylene glycol  17 g Oral QODAY  . potassium chloride  20 mEq Oral BID  . sodium chloride flush  3 mL Intravenous Q12H  . torsemide  80 mg Oral BID  . vitamin B-12  500 mcg Oral Daily   sodium chloride, acetaminophen, docusate sodium, Glycerin (Adult), hydrALAZINE, HYDROcodone-acetaminophen, hydrOXYzine, nitroGLYCERIN, sodium chloride flush, zolpidem  Assessment/ Plan:   Chronic renal insufficiency stage IV baseline creatinine 2.5-3.0 with currently stable renal function.  There is no use nonsteroidal inflammatories Cox 2 inhibitors of ACE inhibitors, no IV contrast administration.  She has f/u with Dr. Jimmy Footman in clinic in 3 weeks already arranged.  AoCHF: back to baseline, wt down  ~10lbs.  Switched back to home dose demadex yesterday which we will continue today.  Has home prn metolazone as well and discussed daily wts, low Na, prn metolazone with her today.  Dyspnea: she had worsening dyspnea yesterday; CXR with small R effusion o/w improved.  Dyspnea improved today.  Hypertension/volume appears adequately controlled with diuretic therapy and current medictions  Anemia and stable no ESA at this time continues oral ferrous sulfate  BMM Calcitrol 0.25 mcg daily she uses calcium carbonate once daily  Mitral regurgitation followed by cardiology according to Dr. Tamala Julian she may be a candidate for mitraClip  Congestive heart failure EF 25%  History of atrial fibrillation is refused anticoagulation  Obstructive sleep apnea continues on  BiPAP with home machine   LOS: 4 Jannifer Hick A @TODAY @12 :58 PM

## 2018-06-27 LAB — BASIC METABOLIC PANEL
ANION GAP: 8 (ref 5–15)
BUN: 71 mg/dL — AB (ref 8–23)
CHLORIDE: 99 mmol/L (ref 98–111)
CO2: 34 mmol/L — ABNORMAL HIGH (ref 22–32)
Calcium: 9.4 mg/dL (ref 8.9–10.3)
Creatinine, Ser: 2.46 mg/dL — ABNORMAL HIGH (ref 0.44–1.00)
GFR, EST AFRICAN AMERICAN: 21 mL/min — AB (ref >60)
GFR, EST NON AFRICAN AMERICAN: 18 mL/min — AB (ref >60)
Glucose, Bld: 89 mg/dL (ref 70–99)
POTASSIUM: 4.1 mmol/L (ref 3.5–5.1)
SODIUM: 141 mmol/L (ref 135–145)

## 2018-06-27 LAB — CBC
HEMATOCRIT: 32.2 % — AB (ref 36.0–46.0)
HEMOGLOBIN: 10.2 g/dL — AB (ref 12.0–15.0)
MCH: 31.7 pg (ref 26.0–34.0)
MCHC: 31.7 g/dL (ref 30.0–36.0)
MCV: 100 fL (ref 78.0–100.0)
Platelets: 106 10*3/uL — ABNORMAL LOW (ref 150–400)
RBC: 3.22 MIL/uL — AB (ref 3.87–5.11)
RDW: 14.9 % (ref 11.5–15.5)
WBC: 4.5 10*3/uL (ref 4.0–10.5)

## 2018-06-27 MED ORDER — SODIUM CHLORIDE 0.9 % IV SOLN
INTRAVENOUS | Status: DC
  Administered 2018-06-28: 07:00:00 via INTRAVENOUS

## 2018-06-27 NOTE — Progress Notes (Signed)
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  Due to her chronic kidney disease and renal failure nephrology has been consulted as well.  Subjective: -Denies any chest pain or palpitations, denies any shortness of breath at rest but has dyspnea with ambulation.  Denies any fever chills, abdominal pain, nausea or vomiting.  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  -Heart failure team following, diuresis per them -Weight improving some today on repeat  Severe mitral regurgitation -?  Benefiting from mitral clip, appreciate cardiology assistance, she is to get a right heart cath and a TEE tomorrow  Chronic kidney disease stage IV -Nephrology consulted and follows patient.  Her kidney function remained stable with diuresis and seems to be tolerating torsemide well -Creatinine remains stable today  OSA -Continue BiPAP at home regimen  Left great toe injury -Continue topical ointment  Concern for pneumonia -Family had concerns for pneumonia based on the  chest x-ray on admission, however she has no leukocytosis, afebrile, no productive cough, now monitor off antibiotics -Remains afebrile and without issues  History of AV node ablation for A. fib status post pacemaker -Stable, plans per cardiology  Liver cirrhosis -Platelets 106 this morning  Scheduled Meds: . allopurinol  100 mg Oral BID  . aspirin  325 mg Oral QPM  . calcitRIOL  0.25 mcg Oral QODAY  . calcium carbonate  1,250 mg Oral Q lunch  . ferrous sulfate  325 mg Oral QPM  . furosemide  80 mg Intravenous Daily  . HYDROcodone-acetaminophen  1 tablet Oral TID  . isosorbide mononitrate  15 mg Oral Daily  . lactose free nutrition  237 mL Oral TID WC  . multivitamin with minerals  1 tablet Oral Daily  . neomycin-bacitracin-polymyxin   Topical Daily  . nystatin   Topical Daily  . polyethylene glycol  17 g Oral QODAY  . potassium chloride  20 mEq Oral BID  . sodium chloride flush  3 mL Intravenous Q12H  . vitamin B-12  500 mcg Oral Daily   Continuous Infusions: . sodium chloride     PRN Meds:.sodium chloride, acetaminophen, docusate sodium, Glycerin (Adult), hydrALAZINE, HYDROcodone-acetaminophen, hydrOXYzine, nitroGLYCERIN, sodium chloride flush, zolpidem  DVT prophylaxis: heparin Code Status: DNR Family Communication: no family at bedside Disposition Plan: home when cleared by cardiology   Consultants:   Cardiology  Nephrology   Procedures:   2D echo:  Impressions: - Akinesis of the distal septum and apex; overall mild to moderate LV dysfunction; severe LVE; mild AS (mean gradient 14 mmHg); severe MR; severe biatrial enlargement; moderate TR with mild pulmonary hypertension.  Antimicrobials:  None    Objective: Vitals:   06/26/18 1723 06/26/18 2155 06/27/18 0500 06/27/18 0900  BP: (!) 111/54 (!) 124/102    Pulse: 97 (!) 102    Resp:  17    Temp: 97.7 F (36.5 C) 98.4 F (36.9 C)    TempSrc: Oral Oral    SpO2: 95% 96%    Weight:   114.9 kg 114.2 kg    Height:        Intake/Output Summary (Last 24 hours) at 06/27/2018 0959 Last data filed at 06/27/2018 0900 Gross per 24 hour  Intake 240 ml  Output 550 ml  Net -310 ml   Filed Weights   06/26/18 0500 06/27/18 0500 06/27/18 0900  Weight: 114.7 kg 114.9 kg 114.2 kg    Examination: Constitutional: No distress, sitting at the edge of the bed Eyes: No scleral icterus ENMT: Moist mucous membranes Respiratory: Good air movement bilaterally, no wheezing, no crackles.   Cardiovascular: Regular rate and rhythm, 3/6 SEM, trace lower extremity edema Abdomen: non tender, BS+ Skin: No rashes seen, chronic venous stasis changes bilateral lower extremities Neurologic: Nonfocal   Data Reviewed: I have independently reviewed following labs and imaging studies   CBC: Recent Labs  Lab 06/23/18 0748 06/24/18 0331 06/25/18 0257 06/26/18 0339 06/27/18 0353  WBC 4.9 4.7 4.0 4.4 4.5  NEUTROABS 3.3  --   --  2.8  --   HGB 10.7* 11.3* 10.1* 10.3* 10.2*  HCT 33.3* 35.1* 32.0* 33.1* 32.2*  MCV 98.5 99.2 99.4 100.3* 100.0  PLT 111* 112* 87* 105* 671*   Basic Metabolic Panel: Recent Labs  Lab 06/21/18 1552 06/22/18 0059 06/23/18 0247 06/24/18 0331 06/25/18 0257 06/26/18 0339 06/27/18 0353  NA 140 139 139 141 140 141 141  K 3.8 3.5 3.8 4.1 4.0 4.3 4.1  CL 93* 93* 95* 97* 96* 99 99  CO2 30 35* 31 34* 34* 35* 34*  GLUCOSE 80 95 100* 91 92 92 89  BUN 85* 79* 76* 73* 72* 73* 71*  CREATININE 2.44* 2.56* 2.48* 2.30* 2.36* 2.57* 2.46*  CALCIUM 9.9 9.4 9.7 9.8 9.5 9.3 9.4  MG 2.7* 2.7*  --   --   --   --   --   PHOS  --   --   --   --   --  3.5  --    GFR: Estimated Creatinine Clearance: 26.1 mL/min (A) (by C-G formula based on SCr of 2.46 mg/dL (H)). Liver Function Tests: Recent Labs  Lab 06/26/18 0339  ALBUMIN 3.7   No results for input(s): LIPASE, AMYLASE in the last 168 hours. No results for input(s): AMMONIA in the last 168 hours. Coagulation Profile: Recent Labs  Lab  06/23/18 0748  INR 1.22   Cardiac Enzymes: Recent Labs  Lab 06/21/18 0138 06/21/18 0717 06/21/18 1312  TROPONINI 0.04* 0.05* 0.05*   BNP (last 3 results) No results for input(s): PROBNP in the last 8760 hours. HbA1C: No results for input(s): HGBA1C in the last 72 hours. CBG: No results for input(s): GLUCAP in the last 168 hours. Lipid Profile: No results for input(s): CHOL, HDL, LDLCALC, TRIG, CHOLHDL, LDLDIRECT in the last 72 hours. Thyroid Function Tests: No results for input(s): TSH, T4TOTAL, FREET4, T3FREE, THYROIDAB in the last 72 hours. Anemia Panel: No results for input(s): VITAMINB12, FOLATE, FERRITIN, TIBC, IRON, RETICCTPCT in the last 72 hours. Urine analysis:    Component Value Date/Time   COLORURINE YELLOW 12/01/2016 [place] 12/01/2016 1224  LABSPEC 1.009 12/01/2016 1224   PHURINE 6.0 12/01/2016 1224   GLUCOSEU NEGATIVE 12/01/2016 1224   HGBUR SMALL (A) 12/01/2016 1224   [place] 12/01/2016 1224   KETONESUR NEGATIVE 12/01/2016 1224   PROTEINUR NEGATIVE 12/01/2016 1224   UROBILINOGEN 0.2 03/06/2013 2035   NITRITE NEGATIVE 12/01/2016 1224   LEUKOCYTESUR LARGE (A) 12/01/2016 1224   Sepsis Labs: Invalid input(s): PROCALCITONIN, LACTICIDVEN  Recent Results (from the past 240 hour(s))  Blood culture (routine x 2)     Status: None   Collection Time: 06/21/18 12:49 AM  Result Value Ref Range Status   Specimen Description BLOOD LEFT WRIST  Final   Special Requests   Final    BOTTLES DRAWN AEROBIC AND ANAEROBIC Blood Culture results may not be optimal due to an excessive volume of blood received in culture bottles   Culture   Final    NO GROWTH 5 DAYS Performed at [facility] [address], [place], [place] [address]    Report Status 06/26/2018 FINAL  Final  Blood culture (routine x 2)     Status: None   Collection Time: 06/21/18 12:57 AM  Result Value Ref Range Status   Specimen Description BLOOD RIGHT WRIST  Final    Special Requests   Final    BOTTLES DRAWN AEROBIC AND ANAEROBIC Blood Culture results may not be optimal due to an excessive volume of blood received in culture bottles   Culture   Final    NO GROWTH 5 DAYS Performed at [facility], [place] [address], [place], [place] [address]    Report Status 06/26/2018 FINAL  Final    Radiology Studies: Dg Chest 2 View  Result Date: 06/25/2018 CLINICAL DATA:  Episodes of shortness of breath since being admitted for pneumonia, history non ischemic cardiomyopathy, cervical cancer, atrial fibrillation, CHF, hypertension, GERD, former smoker EXAM: CHEST - 2 VIEW COMPARISON:  06/20/2018 FINDINGS: LEFT subclavian pacemaker with lead projecting over RIGHT ventricle. Enlargement of cardiac silhouette with pulmonary vascular congestion. Mediastinal contours normal. Atherosclerotic calcification aorta. Small to moderate RIGHT pleural effusion and basilar atelectasis. Minimal perihilar edema questioned. No pneumothorax or acute osseous findings. Bones demineralized with glenohumeral degenerative changes bilaterally. IMPRESSION: Enlargement of cardiac silhouette pulmonary vascular congestion and question minimal pulmonary edema. Small to moderate RIGHT pleural effusion and RIGHT basilar atelectasis. Electronically Signed   By: Lavonia Dana M.D.   On: 06/25/2018 14:25   Marzetta Board, MD, PhD [facility] Pager [phone]  If 7PM-7AM, please contact night-coverage [email or web address] Password [email or web address] 06/27/2018, 9:59 AM

## 2018-06-27 NOTE — Progress Notes (Signed)
[facility] ROUNDING NOTE   Subjective:   Feels back to baseline.  Wt down about 0.5kg more.    Cardiology planning Friday RHC and TEE to eval for possibility of valve intervention.     Objective:  Vital signs in last 24 hours:  Temp:  [97.7 F (36.5 C)-98.4 F (36.9 C)] 98.4 F (36.9 C) (10/02 2155) Pulse Rate:  [97-102] 102 (10/02 2155) Resp:  [17] 17 (10/02 2155) BP: (111-124)/(54-102) 124/102 (10/02 2155) SpO2:  [95 %-96 %] 96 % (10/02 2155) Weight:  [114.2 kg-114.9 kg] 114.2 kg (10/03 0900)  Weight change: 0.272 kg Filed Weights   06/26/18 0500 06/27/18 0500 06/27/18 0900  Weight: 114.7 kg 114.9 kg 114.2 kg    Intake/Output: No intake/output data recorded.   Intake/Output this shift:  Total I/O In: 240 [P.O.:240] Out: 550 [Urine:550]  CVS- RRR holosystolic murmur  Lungs - normal WOB today, LL clear, RL dec BS to mid lung field, no wheezes or rales ABD- BS present soft non-distended EXT-Trace to 1+ edema BL with ruddy scaling discoloration L > R which she says is chronic - stable c/w yesterday   Basic Metabolic Panel: Recent Labs  Lab 06/21/18 1552 06/22/18 0059 06/23/18 0247 06/24/18 0331 06/25/18 0257 06/26/18 0339 06/27/18 0353  NA 140 139 139 141 140 141 141  K 3.8 3.5 3.8 4.1 4.0 4.3 4.1  CL 93* 93* 95* 97* 96* 99 99  CO2 30 35* 31 34* 34* 35* 34*  GLUCOSE 80 95 100* 91 92 92 89  BUN 85* 79* 76* 73* 72* 73* 71*  CREATININE 2.44* 2.56* 2.48* 2.30* 2.36* 2.57* 2.46*  CALCIUM 9.9 9.4 9.7 9.8 9.5 9.3 9.4  MG 2.7* 2.7*  --   --   --   --   --   PHOS  --   --   --   --   --  3.5  --     Liver Function Tests: Recent Labs  Lab 06/26/18 0339  ALBUMIN 3.7   No results for input(s): LIPASE, AMYLASE in the last 168 hours. No results for input(s): AMMONIA in the last 168 hours.  CBC: Recent Labs  Lab 06/23/18 0748 06/24/18 0331 06/25/18 0257 06/26/18 0339 06/27/18 0353  WBC 4.9 4.7 4.0 4.4 4.5  NEUTROABS 3.3  --   --  2.8   --   HGB 10.7* 11.3* 10.1* 10.3* 10.2*  HCT 33.3* 35.1* 32.0* 33.1* 32.2*  MCV 98.5 99.2 99.4 100.3* 100.0  PLT 111* 112* 87* 105* 106*    Cardiac Enzymes: Recent Labs  Lab 06/21/18 0138 06/21/18 0717 06/21/18 1312  TROPONINI 0.04* 0.05* 0.05*    BNP: Invalid input(s): POCBNP  CBG: No results for input(s): GLUCAP in the last 168 hours.  Microbiology: Results for orders placed or performed during the hospital encounter of 06/20/18  Blood culture (routine x 2)     Status: None   Collection Time: 06/21/18 12:49 AM  Result Value Ref Range Status   Specimen Description BLOOD LEFT WRIST  Final   Special Requests   Final    BOTTLES DRAWN AEROBIC AND ANAEROBIC Blood Culture results may not be optimal due to an excessive volume of blood received in culture bottles   Culture   Final    NO GROWTH 5 DAYS Performed at [facility], [place] [address], [place], [place] [address]    Report Status 06/26/2018 FINAL  Final  Blood culture (routine x 2)     Status: None  Collection Time: 06/21/18 12:57 AM  Result Value Ref Range Status   Specimen Description BLOOD RIGHT WRIST  Final   Special Requests   Final    BOTTLES DRAWN AEROBIC AND ANAEROBIC Blood Culture results may not be optimal due to an excessive volume of blood received in culture bottles   Culture   Final    NO GROWTH 5 DAYS Performed at [facility] [address], [place], [place] [address]    Report Status 06/26/2018 FINAL  Final    Coagulation Studies: No results for input(s): LABPROT, INR in the last 72 hours.  Urinalysis: No results for input(s): COLORURINE, LABSPEC, PHURINE, GLUCOSEU, HGBUR, BILIRUBINUR, KETONESUR, PROTEINUR, UROBILINOGEN, NITRITE, LEUKOCYTESUR in the last 72 hours.  Invalid input(s): APPERANCEUR    Imaging: Dg Chest 2 View  Result Date: 06/25/2018 CLINICAL DATA:  Episodes of shortness of breath since being admitted for pneumonia, history non ischemic cardiomyopathy, cervical  cancer, atrial fibrillation, CHF, hypertension, GERD, former smoker EXAM: CHEST - 2 VIEW COMPARISON:  06/20/2018 FINDINGS: LEFT subclavian pacemaker with lead projecting over RIGHT ventricle. Enlargement of cardiac silhouette with pulmonary vascular congestion. Mediastinal contours normal. Atherosclerotic calcification aorta. Small to moderate RIGHT pleural effusion and basilar atelectasis. Minimal perihilar edema questioned. No pneumothorax or acute osseous findings. Bones demineralized with glenohumeral degenerative changes bilaterally. IMPRESSION: Enlargement of cardiac silhouette pulmonary vascular congestion and question minimal pulmonary edema. Small to moderate RIGHT pleural effusion and RIGHT basilar atelectasis. Electronically Signed   By: Lavonia Dana M.D.   On: 06/25/2018 14:25     Medications:   . sodium chloride     . allopurinol  100 mg Oral BID  . aspirin  325 mg Oral QPM  . calcitRIOL  0.25 mcg Oral QODAY  . calcium carbonate  1,250 mg Oral Q lunch  . ferrous sulfate  325 mg Oral QPM  . furosemide  80 mg Intravenous Daily  . HYDROcodone-acetaminophen  1 tablet Oral TID  . isosorbide mononitrate  15 mg Oral Daily  . lactose free nutrition  237 mL Oral TID WC  . multivitamin with minerals  1 tablet Oral Daily  . neomycin-bacitracin-polymyxin   Topical Daily  . nystatin   Topical Daily  . polyethylene glycol  17 g Oral QODAY  . potassium chloride  20 mEq Oral BID  . sodium chloride flush  3 mL Intravenous Q12H  . vitamin B-12  500 mcg Oral Daily   sodium chloride, acetaminophen, docusate sodium, Glycerin (Adult), hydrALAZINE, HYDROcodone-acetaminophen, hydrOXYzine, nitroGLYCERIN, sodium chloride flush, zolpidem  Assessment/ Plan:   Chronic renal insufficiency stage IV baseline creatinine 2.5-3.0 with currently stable renal function.  There is no use nonsteroidal inflammatories Cox 2 inhibitors of ACE inhibitors, no IV contrast administration - she mentions she was told she  may need contrast with Friday's procedures (I see RHC and TEE planned;  If contrast required should be minimized and we discussed risk:benefit of contrast if necessary for pre-op eval/planning).  She has f/u with Dr. Jimmy Footman in clinic in 3 weeks already arranged.  AoCHF: back to baseline, wt down ~11lbs.  Switched back to home dose demadex 10/1 which we will continue today.  She takes metolazone 2.5 PRN - she does not need this today.   Dyspnea: she had worsening dyspnea yesterday; CXR with small R effusion o/w improved.  Dyspnea improved today.  Valvular heart disease thought to be a strong contributor as well.  Hypertension/volume appears adequately controlled with diuretic therapy and current medictions  Anemia and  stable no ESA at this time continues oral ferrous sulfate  BMM Calcitrol 0.25 mcg daily she uses calcium carbonate once daily  Mitral regurgitation followed by cardiology according to Dr. Tamala Julian she may be a candidate for mitraClip -- see above, eval planned for tomorrow.   Congestive heart failure h/o  EF 25%, TTE 9/29 EF 40-45%  History of atrial fibrillation is refused anticoagulation  Obstructive sleep apnea continues on BiPAP with home machine   LOS: 5 Jannifer Hick A @TODAY @9 :34 AM

## 2018-06-27 NOTE — Progress Notes (Signed)
Home BiPAP patient will place on self

## 2018-06-27 NOTE — Progress Notes (Addendum)
Advanced Heart Failure Rounding Note  PCP-Cardiologist: Dorris Carnes, MD   Subjective:    Given 80 mg IV lasix x1 yesterday. No UOP charted. Weight up 1 lb, but appears to be bed weight. Creatinine 2.57 > 2.46  Feels the same. Still SOB on exertion and orthopneic. She wants her daughters to talk to Dr Haroldine Laws before making a decision about [employer].  Objective:   Weight Range: 114.9 kg Body mass index is 40.9 kg/m.   Vital Signs:   Temp:  [97.7 F (36.5 C)-98.4 F (36.9 C)] 98.4 F (36.9 C) (10/02 2155) Pulse Rate:  [97-102] 102 (10/02 2155) Resp:  [17] 17 (10/02 2155) BP: (111-124)/(54-102) 124/102 (10/02 2155) SpO2:  [95 %-96 %] 96 % (10/02 2155) Weight:  [114.9 kg] 114.9 kg (10/03 0500) Last BM Date: 06/25/18  Weight change: Filed Weights   06/25/18 0600 06/26/18 0500 06/27/18 0500  Weight: 115.5 kg 114.7 kg 114.9 kg    Intake/Output:  No intake or output data in the 24 hours ending 06/27/18 0845    Physical Exam    General:  Chronically ill appearing. No resp difficulty HEENT: Normal Neck: Supple. JVP to jaw. Carotids 2+ bilat; no bruits. No lymphadenopathy or thyromegaly appreciated. Cor: PMI nondisplaced. Regular rate & rhythm. No rubs, gallops or murmurs. Lungs: diminished throughout. Abdomen: Soft, nontender, +distended. No hepatosplenomegaly. No bruits or masses. Good bowel sounds. Extremities: No cyanosis, clubbing, rash, 1-2+ edema with chronic venous changes L>R Neuro: Alert & orientedx3, cranial nerves grossly intact. moves all 4 extremities w/o difficulty. Affect pleasant   Telemetry   Vpaced 100s. Personally reviewed.   EKG    No new tracings.   Labs    CBC Recent Labs    06/26/18 0339 06/27/18 0353  WBC 4.4 4.5  NEUTROABS 2.8  --   HGB 10.3* 10.2*  HCT 33.1* 32.2*  MCV 100.3* 100.0  PLT 105* 557*   Basic Metabolic Panel Recent Labs    06/26/18 0339 06/27/18 0353  NA 141 141  K 4.3 4.1  CL 99 99  CO2 35* 34*    GLUCOSE 92 89  BUN 73* 71*  CREATININE 2.57* 2.46*  CALCIUM 9.3 9.4  PHOS 3.5  --    Liver Function Tests Recent Labs    06/26/18 0339  ALBUMIN 3.7   No results for input(s): LIPASE, AMYLASE in the last 72 hours. Cardiac Enzymes No results for input(s): CKTOTAL, CKMB, CKMBINDEX, TROPONINI in the last 72 hours.  BNP: BNP (last 3 results) Recent Labs    06/20/18 2249  BNP 160.6*    ProBNP (last 3 results) No results for input(s): PROBNP in the last 8760 hours.   D-Dimer No results for input(s): DDIMER in the last 72 hours. Hemoglobin A1C No results for input(s): HGBA1C in the last 72 hours. Fasting Lipid Panel No results for input(s): CHOL, HDL, LDLCALC, TRIG, CHOLHDL, LDLDIRECT in the last 72 hours. Thyroid Function Tests No results for input(s): TSH, T4TOTAL, T3FREE, THYROIDAB in the last 72 hours.  Invalid input(s): FREET3  Other results:   Imaging     No results found.   Medications:     Scheduled Medications: . allopurinol  100 mg Oral BID  . aspirin  325 mg Oral QPM  . calcitRIOL  0.25 mcg Oral QODAY  . calcium carbonate  1,250 mg Oral Q lunch  . ferrous sulfate  325 mg Oral QPM  . furosemide  80 mg Intravenous Daily  . HYDROcodone-acetaminophen  1 tablet  Oral TID  . isosorbide mononitrate  15 mg Oral Daily  . lactose free nutrition  237 mL Oral TID WC  . multivitamin with minerals  1 tablet Oral Daily  . neomycin-bacitracin-polymyxin   Topical Daily  . nystatin   Topical Daily  . polyethylene glycol  17 g Oral QODAY  . potassium chloride  20 mEq Oral BID  . sodium chloride flush  3 mL Intravenous Q12H  . vitamin B-12  500 mcg Oral Daily     Infusions: . sodium chloride       PRN Medications:  sodium chloride, acetaminophen, docusate sodium, Glycerin (Adult), hydrALAZINE, HYDROcodone-acetaminophen, hydrOXYzine, nitroGLYCERIN, sodium chloride flush, zolpidem    Patient Profile   [name] [name] is a 72 y.o. female with a  history of morbid obesity, afib s/p AV ablation and [employer] Jude PPM, chronic combined HF, CKD IV, MR, TR, pulmonary HTN, OSA on BiPAP, GERD, gout, iron deficiency anemia.  Admitted 06/20/18 with CP and SOB.   Assessment/Plan   1. Acute on chronic combined HF - Echo 06/21/18: EF 40-45%, LV severely dilated, mild LVH, akinesis of mid-apicalanteroseptal and apical myocardium, mild AS, severe MR, LA and RA severely dilated, moderate TR, PA peak pressure 42 mmHg - Volume status remains elevated. Monitor creatinine closely.  - Continue lasix 80 mg IV daily - Continue hydral/imdur - No spiro with CKD - No BB with volume overload. - Discussed request for strict I/O and standings weights with RN.  2. Severe MR  - Severe MR on echo 06/21/18 (mild on echo 10/2016) - Will need TEE and RHC to evaluate for mitra clip candidacy. She would like Dr Haroldine Laws to discuss with her daughters and then decide.  - Scheduled TEE for 12:45 pm Friday. - Scheduled for [place] 10:30 Friday.   3. Afib s/p AV ablation and PPM - Not on AC. No change. - She is Vpaced with back up rate of 90, which Dr Lovena Le and pt have agreed on. She does not want it any lower.   4. OSA - Wears BiPAP qHS  5. CKD 3 - Unclear baseline, seems to be ~2.5. Creatinine 2.46 this morning. - Monitor daily BMET  Medication concerns reviewed with patient and pharmacy team. Barriers identified: none at this time.   Will tentatively schedule RHC and TEE for tomorrow. She is still unsure if she wants to go forward with mitra-clip and wants Dr Haroldine Laws to speak with her daughters before making a decision.   Length of Stay: [place], NP  06/27/2018, 8:45 AM  Advanced Heart Failure Team Pager [phone] (M-F; 7a - 4p)  Please contact [facility] for night-coverage after hours (4p -7a ) and weekends on [email or web address]  Patient seen and examined with the above-signed Advanced Practice Provider and/or Housestaff. I personally reviewed  laboratory data, imaging studies and relevant notes. I independently examined the patient and formulated the important aspects of the plan. I have edited the note to reflect any of my changes or salient points. I have personally discussed the plan with the patient and/or family.  Seems better today with IV lasix. Weight rechecked and she is down a pound. Creatinine stable. Remains volume overloaded though she is nearing baseline weight. Will continue IV lasix today and likely switch back to home demadex tomorrow per Ren[facility]. Plan for TEE and RHC discussed with her. I will also discuss with her family. Will review echo later today with structural team. Will need to consider [facility] for AF at  some point.   Glori Bickers, MD  1:01 PM

## 2018-06-28 ENCOUNTER — Inpatient Hospital Stay (HOSPITAL_COMMUNITY): Payer: Medicare Other

## 2018-06-28 ENCOUNTER — Encounter (HOSPITAL_COMMUNITY): Payer: Self-pay | Admitting: Internal Medicine

## 2018-06-28 ENCOUNTER — Inpatient Hospital Stay (HOSPITAL_COMMUNITY): Payer: Medicare Other | Admitting: Certified Registered"

## 2018-06-28 ENCOUNTER — Ambulatory Visit (HOSPITAL_COMMUNITY): Admit: 2018-06-28 | Payer: Medicare Other | Admitting: Internal Medicine

## 2018-06-28 ENCOUNTER — Encounter (HOSPITAL_COMMUNITY)
Admission: EM | Disposition: A | Discharge status: Home Health | Payer: Self-pay | Source: Home / Self Care | Attending: Internal Medicine

## 2018-06-28 ENCOUNTER — Inpatient Hospital Stay (HOSPITAL_COMMUNITY)
Admission: EM | Disposition: A | Discharge status: Home Health | Payer: Self-pay | Source: Home / Self Care | Attending: Internal Medicine

## 2018-06-28 DIAGNOSIS — I340 Nonrheumatic mitral (valve) insufficiency: Secondary | ICD-10-CM

## 2018-06-28 HISTORY — PX: RIGHT HEART CATH: CATH118263

## 2018-06-28 HISTORY — PX: TEE WITHOUT CARDIOVERSION: SHX5443

## 2018-06-28 LAB — POCT I-STAT 3, VENOUS BLOOD GAS (G3P V)
ACID-BASE EXCESS: 8 mmol/L — AB (ref 0.0–2.0)
Acid-Base Excess: 7 mmol/L — ABNORMAL HIGH (ref 0.0–2.0)
Bicarbonate: 31.6 mmol/L — ABNORMAL HIGH (ref 20.0–28.0)
Bicarbonate: 32.9 mmol/L — ABNORMAL HIGH (ref 20.0–28.0)
O2 SAT: 63 %
O2 SAT: 67 %
PCO2 VEN: 46.2 mmHg (ref 44.0–60.0)
PCO2 VEN: 48.2 mmHg (ref 44.0–60.0)
PH VEN: 7.442 — AB (ref 7.250–7.430)
PH VEN: 7.443 — AB (ref 7.250–7.430)
PO2 VEN: 32 mmHg (ref 32.0–45.0)
PO2 VEN: 34 mmHg (ref 32.0–45.0)
TCO2: 33 mmol/L — AB (ref 22–32)
TCO2: 34 mmol/L — ABNORMAL HIGH (ref 22–32)

## 2018-06-28 LAB — BASIC METABOLIC PANEL
ANION GAP: 8 (ref 5–15)
BUN: 73 mg/dL — ABNORMAL HIGH (ref 8–23)
CO2: 32 mmol/L (ref 22–32)
Calcium: 9.5 mg/dL (ref 8.9–10.3)
Chloride: 100 mmol/L (ref 98–111)
Creatinine, Ser: 2.46 mg/dL — ABNORMAL HIGH (ref 0.44–1.00)
GFR calc non Af Amer: 18 mL/min — ABNORMAL LOW (ref >60)
GFR, EST AFRICAN AMERICAN: 21 mL/min — AB (ref >60)
GLUCOSE: 90 mg/dL (ref 70–99)
POTASSIUM: 4.4 mmol/L (ref 3.5–5.1)
SODIUM: 140 mmol/L (ref 135–145)

## 2018-06-28 SURGERY — RIGHT HEART CATH
Anesthesia: LOCAL

## 2018-06-28 SURGERY — ECHOCARDIOGRAM, TRANSESOPHAGEAL
Anesthesia: General

## 2018-06-28 MED ORDER — ONDANSETRON HCL 4 MG/2ML IJ SOLN: 4.0000 mg | Freq: Four times a day (QID) | INTRAMUSCULAR | Status: DC | PRN

## 2018-06-28 MED ORDER — PHENYLEPHRINE 40 MCG/ML (10ML) SYRINGE FOR IV PUSH (FOR BLOOD PRESSURE SUPPORT)
PREFILLED_SYRINGE | INTRAVENOUS | Status: DC | PRN
  Administered 2018-06-28: 40 ug via INTRAVENOUS
  Administered 2018-06-28: 80 ug via INTRAVENOUS

## 2018-06-28 MED ORDER — METOLAZONE 2.5 MG PO TABS
2.5000 mg | ORAL_TABLET | Freq: Once | ORAL | Status: AC
  Administered 2018-06-28: 2.5 mg via ORAL
  Filled 2018-06-28: qty 1

## 2018-06-28 MED ORDER — LIDOCAINE HCL (PF) 1 % IJ SOLN
INTRAMUSCULAR | Status: DC | PRN
  Administered 2018-06-28: 2 mL

## 2018-06-28 MED ORDER — LACTATED RINGERS IV SOLN
INTRAVENOUS | Status: DC | PRN
  Administered 2018-06-28: 13:00:00 via INTRAVENOUS

## 2018-06-28 MED ORDER — FUROSEMIDE 10 MG/ML IJ SOLN
120.0000 mg | Freq: Two times a day (BID) | INTRAVENOUS | Status: DC
  Administered 2018-06-28 – 2018-07-01 (×8): 120 mg via INTRAVENOUS
  Filled 2018-06-28 (×3): qty 10
  Filled 2018-06-28: qty 12
  Filled 2018-06-28: qty 10
  Filled 2018-06-28: qty 12
  Filled 2018-06-28 (×3): qty 10

## 2018-06-28 MED ORDER — SODIUM CHLORIDE 0.9 % IV SOLN: 250.0000 mL | INTRAVENOUS | Status: DC | PRN

## 2018-06-28 MED ORDER — SODIUM CHLORIDE 0.9% FLUSH: 3.0000 mL | INTRAVENOUS | Status: DC | PRN

## 2018-06-28 MED ORDER — LIDOCAINE HCL (PF) 1 % IJ SOLN
INTRAMUSCULAR | Status: AC
  Filled 2018-06-28: qty 30

## 2018-06-28 MED ORDER — ENOXAPARIN SODIUM 30 MG/0.3ML ~~LOC~~ SOLN
30.0000 mg | SUBCUTANEOUS | Status: DC
  Filled 2018-06-28 (×4): qty 0.3

## 2018-06-28 MED ORDER — SODIUM CHLORIDE 0.9% FLUSH
3.0000 mL | Freq: Two times a day (BID) | INTRAVENOUS | Status: DC
  Administered 2018-06-28 – 2018-07-02 (×5): 3 mL via INTRAVENOUS

## 2018-06-28 MED ORDER — PROPOFOL 500 MG/50ML IV EMUL
INTRAVENOUS | Status: DC | PRN
  Administered 2018-06-28: 75 ug/kg/min via INTRAVENOUS

## 2018-06-28 MED ORDER — HEPARIN (PORCINE) IN NACL 1000-0.9 UT/500ML-% IV SOLN
INTRAVENOUS | Status: AC
  Filled 2018-06-28: qty 1000

## 2018-06-28 MED ORDER — ACETAMINOPHEN 325 MG PO TABS: 650.0000 mg | ORAL_TABLET | ORAL | Status: DC | PRN

## 2018-06-28 MED ORDER — BUTAMBEN-TETRACAINE-BENZOCAINE 2-2-14 % EX AERO
INHALATION_SPRAY | CUTANEOUS | Status: DC | PRN
  Administered 2018-06-28: 2 via TOPICAL

## 2018-06-28 MED ORDER — METOLAZONE 2.5 MG PO TABS: 2.5000 mg | ORAL_TABLET | Freq: Every day | ORAL | Status: DC

## 2018-06-28 MED ORDER — PROPOFOL 10 MG/ML IV BOLUS
INTRAVENOUS | Status: DC | PRN
  Administered 2018-06-28: 20 mg via INTRAVENOUS

## 2018-06-28 MED ORDER — HEPARIN (PORCINE) IN NACL 1000-0.9 UT/500ML-% IV SOLN
INTRAVENOUS | Status: DC | PRN
  Administered 2018-06-28: 500 mL

## 2018-06-28 SURGICAL SUPPLY — 9 items
CATH BALLN WEDGE 5F 110CM (CATHETERS) ×1 IMPLANT
HOVERMATT SINGLE USE (MISCELLANEOUS) ×1 IMPLANT
PACK CARDIAC CATHETERIZATION (CUSTOM PROCEDURE TRAY) ×2 IMPLANT
PROTECTION STATION PRESSURIZED (MISCELLANEOUS) ×2
SHEATH GLIDE SLENDER 4/5FR (SHEATH) ×1 IMPLANT
STATION PROTECTION PRESSURIZED (MISCELLANEOUS) IMPLANT
TRANSDUCER W/STOPCOCK (MISCELLANEOUS) ×2 IMPLANT
TUBING ART PRESS 72  MALE/FEM (TUBING) ×1
TUBING ART PRESS 72 MALE/FEM (TUBING) IMPLANT

## 2018-06-28 NOTE — Interval H&P Note (Signed)
History and Physical Interval Note:  06/28/2018 10:10 AM  [name] [name]  has presented today for surgery, with the diagnosis of severe MR  The various methods of treatment have been discussed with the patient and family. After consideration of risks, benefits and other options for treatment, the patient has consented to Procedure: Right heart catheterization as a surgical intervention .  The patient's history has been reviewed, patient examined, no change in status, stable for surgery.  I have reviewed the patient's chart and labs.  Questions were answered to the patient's satisfaction.     [name]

## 2018-06-28 NOTE — Anesthesia Preprocedure Evaluation (Signed)
Anesthesia Evaluation  Patient identified by MRN, date of birth, ID band Patient awake    Reviewed: Allergy & Precautions, NPO status , Patient's Chart, lab work & pertinent test results  Airway Mallampati: III       Dental  (+) Missing, Dental Advisory Given,    Pulmonary former smoker,     + decreased breath sounds      Cardiovascular hypertension,  Rhythm:Regular Rate:Normal     Neuro/Psych    GI/Hepatic   Endo/Other    Renal/GU      Musculoskeletal   Abdominal   Peds  Hematology   Anesthesia Other Findings   Reproductive/Obstetrics                             Anesthesia Physical Anesthesia Plan  ASA: III  Anesthesia Plan: General   Post-op Pain Management:    Induction: Intravenous  PONV Risk Score and Plan: Ondansetron  Airway Management Planned: Simple Face Mask and Natural Airway  Additional Equipment:   Intra-op Plan:   Post-operative Plan:   Informed Consent: I have reviewed the patients History and Physical, chart, labs and discussed the procedure including the risks, benefits and alternatives for the proposed anesthesia with the patient or authorized representative who has indicated his/her understanding and acceptance.   Dental advisory given  Plan Discussed with: CRNA and Anesthesiologist  Anesthesia Plan Comments:         Anesthesia Quick Evaluation

## 2018-06-28 NOTE — Progress Notes (Signed)
Patient will place home CPAP on self. RT will monitor as needed.

## 2018-06-28 NOTE — H&P (View-Only) (Signed)
Advanced Heart Failure Rounding Note  PCP-Cardiologist: Dorris Carnes, MD   Subjective:    Yesterday diuresed with IV lasix. Weight unchanged.   Remains SOB with exertion. Denies orthopnea orthopnea or PND.   Objective:   Weight Range: 113.9 kg Body mass index is 40.54 kg/m.   Vital Signs:   Temp:  [97.6 F (36.4 C)-98.5 F (36.9 C)] 97.6 F (36.4 C) (10/04 0748) Pulse Rate:  [97-119] 119 (10/04 0748) Resp:  [16] 16 (10/03 2327) BP: (99-107)/(61-68) 99/68 (10/04 0748) SpO2:  [94 %-99 %] 94 % (10/04 0748) Weight:  [113.9 kg] 113.9 kg (10/04 0500) Last BM Date: 06/21/18  Weight change: Filed Weights   06/27/18 0500 06/27/18 0900 06/28/18 0500  Weight: 114.9 kg 114.2 kg 113.9 kg    Intake/Output:   Intake/Output Summary (Last 24 hours) at 06/28/2018 0927 Last data filed at 06/27/2018 2120 Gross per 24 hour  Intake 240 ml  Output 300 ml  Net -60 ml      Physical Exam    General:  Eldelry No resp difficulty HEENT: normal Neck: supple. JVP to jaw Carotids 2+ bilat; no bruits. No lymphadenopathy or thryomegaly appreciated. Cor: PMI nondisplaced. Regular rate & rhythm. No rubs, gallops or murmurs. Lungs: clear Abdomen: obese soft, nontender, nondistended. No hepatosplenomegaly. No bruits or masses. Good bowel sounds. Extremities: no cyanosis, clubbing, rash, R and LLE chronic hyperpigmentation trace edema Neuro: alert & orientedx3, cranial nerves grossly intact. moves all 4 extremities w/o difficulty. Affect pleasant   Telemetry   V Paced 90s  Personally reviewed   EKG    No new tracings.   Labs    CBC Recent Labs    06/26/18 0339 06/27/18 0353  WBC 4.4 4.5  NEUTROABS 2.8  --   HGB 10.3* 10.2*  HCT 33.1* 32.2*  MCV 100.3* 100.0  PLT 105* 937*   Basic Metabolic Panel Recent Labs    06/26/18 0339 06/27/18 0353 06/28/18 0534  NA 141 141 140  K 4.3 4.1 4.4  CL 99 99 100  CO2 35* 34* 32  GLUCOSE 92 89 90  BUN 73* 71* 73*  CREATININE  2.57* 2.46* 2.46*  CALCIUM 9.3 9.4 9.5  PHOS 3.5  --   --    Liver Function Tests Recent Labs    06/26/18 0339  ALBUMIN 3.7   No results for input(s): LIPASE, AMYLASE in the last 72 hours. Cardiac Enzymes No results for input(s): CKTOTAL, CKMB, CKMBINDEX, TROPONINI in the last 72 hours.  BNP: BNP (last 3 results) Recent Labs    06/20/18 2249  BNP 160.6*    ProBNP (last 3 results) No results for input(s): PROBNP in the last 8760 hours.   D-Dimer No results for input(s): DDIMER in the last 72 hours. Hemoglobin A1C No results for input(s): HGBA1C in the last 72 hours. Fasting Lipid Panel No results for input(s): CHOL, HDL, LDLCALC, TRIG, CHOLHDL, LDLDIRECT in the last 72 hours. Thyroid Function Tests No results for input(s): TSH, T4TOTAL, T3FREE, THYROIDAB in the last 72 hours.  Invalid input(s): FREET3  Other results:   Imaging    No results found.   Medications:     Scheduled Medications: . allopurinol  100 mg Oral BID  . aspirin  325 mg Oral QPM  . calcitRIOL  0.25 mcg Oral QODAY  . calcium carbonate  1,250 mg Oral Q lunch  . ferrous sulfate  325 mg Oral QPM  . furosemide  80 mg Intravenous Daily  . HYDROcodone-acetaminophen  1  tablet Oral TID  . isosorbide mononitrate  15 mg Oral Daily  . lactose free nutrition  237 mL Oral TID WC  . multivitamin with minerals  1 tablet Oral Daily  . neomycin-bacitracin-polymyxin   Topical Daily  . nystatin   Topical Daily  . polyethylene glycol  17 g Oral QODAY  . potassium chloride  20 mEq Oral BID  . sodium chloride flush  3 mL Intravenous Q12H  . vitamin B-12  500 mcg Oral Daily    Infusions: . sodium chloride    . sodium chloride 10 mL/hr at 06/28/18 0636    PRN Medications: sodium chloride, acetaminophen, docusate sodium, Glycerin (Adult), hydrALAZINE, HYDROcodone-acetaminophen, hydrOXYzine, nitroGLYCERIN, sodium chloride flush, zolpidem    Patient Profile   [name] [name] is a 72 y.o. female  with a history of morbid obesity, afib s/p AV ablation and [employer] Jude PPM, chronic combined HF, CKD IV, MR, TR, pulmonary HTN, OSA on BiPAP, GERD, gout, iron deficiency anemia.  Admitted 06/20/18 with CP and SOB.   Assessment/Plan   1. Acute on chronic combined HF - Echo 06/21/18: EF 40-45%, LV severely dilated, mild LVH, akinesis of mid-apicalanteroseptal and apical myocardium, mild AS, severe MR, LA and RA severely dilated, moderate TR, PA peak pressure 42 mmHg - RHC this morning. Hold IV lasix and reassess after cath.   - Continue hydral/imdur - No spiro with CKD - No BB with volume overload. - Discussed request for strict I/O and standings weights with RN.  2. Severe MR  - Severe MR on echo 06/21/18 (mild on echo 10/2016) - TEE RHC to assess for mitral clip.  - 3. Afib s/p AV ablation and PPM - Not on AC. No change. - She is Vpaced with back up rate of 90, which Dr Lovena Le and pt have agreed on. She does not want it any lower.   4. OSA - Wears BiPAP qHS   5. CKD 3 - Unclear baseline, seems to be ~2.5.  - Creatinine unchanged 2.46.  - Follow BMET   Plan for TEE/ RHC today.    Length of Stay: [place], NP  06/28/2018, 9:27 AM  Advanced Heart Failure Team Pager [phone] (M-F; 7a - 4p)  Please contact [facility] for night-coverage after hours (4p -7a ) and weekends on [email or web address]  Patient seen and examined with Darrick Grinder, NP. We discussed all aspects of the encounter. I agree with the assessment and plan as stated above.   Symptomatically stable. Case reviewed in detail with Dr. Burt Knack. May be candidate for MitraClip. Will plan TEE and RHC today. Case discussed at length with her daughters by phone. I suspect the only way we will be able to stabilize her HF symptoms will be with clipping if she is a candidate. Creating elevated but stable. Will need to re-discuss [facility] for her a fib. Has not been anticoagulated previously.   Glori Bickers, MD  10:09 AM

## 2018-06-28 NOTE — Anesthesia Postprocedure Evaluation (Signed)
Anesthesia Post Note  Patient: [name] [name]  Procedure(s) Performed: TRANSESOPHAGEAL ECHOCARDIOGRAM (TEE) (N/A )     Patient location during evaluation: PACU Anesthesia Type: General Level of consciousness: awake and alert Pain management: pain level controlled Vital Signs Assessment: post-procedure vital signs reviewed and stable Respiratory status: spontaneous breathing, nonlabored ventilation, respiratory function stable and patient connected to nasal cannula oxygen Cardiovascular status: stable and blood pressure returned to baseline Postop Assessment: no apparent nausea or vomiting Anesthetic complications: no    Last Vitals:  Vitals:   06/28/18 1400 06/28/18 1732  BP: 110/64 (!) 139/93  Pulse: 97 98  Resp: (!) 23   Temp:  36.6 C  SpO2: 98% 92%    Last Pain:  Vitals:   06/28/18 1732  TempSrc: Oral  PainSc:                  [name]

## 2018-06-28 NOTE — CV Procedure (Signed)
    TRANSESOPHAGEAL ECHOCARDIOGRAM   NAME:  [name] [name]   MRN: [id number] DOB:  1946/03/26   ADMIT DATE: 06/20/2018  INDICATIONS:   PROCEDURE:   Informed consent was obtained prior to the procedure. The risks, benefits and alternatives for the procedure were discussed and the patient comprehended these risks.  Risks include, but are not limited to, cough, sore throat, vomiting, nausea, somnolence, esophageal and stomach trauma or perforation, bleeding, low blood pressure, aspiration, pneumonia, infection, trauma to the teeth and death.    After a procedural time-out, the patient was sedated with propofol by the anesthesia service.    The transesophageal probe was inserted in the esophagus and stomach without difficulty and multiple views were obtained.    COMPLICATIONS:    There were no immediate complications.  FINDINGS:  See echo report for full details.   Jerrell Hart,MD 4:12 PM

## 2018-06-28 NOTE — Interval H&P Note (Signed)
History and Physical Interval Note:  06/28/2018 1:21 PM  [name] [name]  has presented today for surgery, with the diagnosis of severe MR  The various methods of treatment have been discussed with the patient and family. After consideration of risks, benefits and other options for treatment, the patient has consented to  Procedure(s): TRANSESOPHAGEAL ECHOCARDIOGRAM (TEE) (N/A) as a surgical intervention .  The patient's history has been reviewed, patient examined, no change in status, stable for surgery.  I have reviewed the patient's chart and labs.  Questions were answered to the patient's satisfaction.     Daniel Bensimhon

## 2018-06-28 NOTE — Progress Notes (Signed)
PROGRESS NOTE  [name] [name] LGX:[id number] DOB: 1946-04-28 DOA: 06/20/2018 PCP: [name], MD   LOS: 6 days   Brief Narrative / Interim history: 72 year old female with history of chronic systolic CHF with EF of 81%, atrial fibrillation, severe mitral regurgitation, chronic kidney disease stage IV, liver cirrhosis, who was admitted to the hospital with chief complaint of shortness of breath, she was found to be fluid overloaded and was placed on diuresis and cardiology was consulted.  Due to her chronic kidney disease and renal failure nephrology has been consulted as well.  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  -Heart failure team following, diuresis per them -Right heart cath today with increased pressures, increased diuresis with IV Lasix -TEE pending  Severe mitral regurgitation -?  Benefiting from mitral clip, appreciate cardiology assistance -Once work-up complete cardiology will make a decision about MitraClip  Chronic kidney disease stage IV -Creatinine remains stable today, nephrology following, increase diuresing as above and will continue to closely monitor  OSA -Continue BiPAP at home regimen  Left great toe injury -Continue topical ointment  Concern for pneumonia -Family had concerns for pneumonia based on the chest x-ray on admission, however she has no leukocytosis, afebrile, no productive cough, now  monitor off antibiotics -Remains afebrile and without issues  History of AV node ablation for A. fib status post pacemaker -Stable, plans per cardiology  Liver cirrhosis -Platelets stable  Scheduled Meds: . allopurinol  100 mg Oral BID  . aspirin  325 mg Oral QPM  . calcitRIOL  0.25 mcg Oral QODAY  . calcium carbonate  1,250 mg Oral Q lunch  . [START ON 06/29/2018] enoxaparin (LOVENOX) injection  30 mg Subcutaneous Q24H  . ferrous sulfate  325 mg Oral QPM  . HYDROcodone-acetaminophen  1 tablet Oral TID  . isosorbide mononitrate  15 mg Oral Daily  . lactose free nutrition  237 mL Oral TID WC  . metolazone  2.5 mg Oral Once  . multivitamin with minerals  1 tablet Oral Daily  . neomycin-bacitracin-polymyxin   Topical Daily  . nystatin   Topical Daily  . polyethylene glycol  17 g Oral QODAY  . potassium chloride  20 mEq Oral BID  . sodium chloride flush  3 mL Intravenous Q12H  . sodium chloride flush  3 mL Intravenous Q12H  . vitamin B-12  500 mcg Oral Daily   Continuous Infusions: . sodium chloride    . sodium chloride    . furosemide     PRN Meds:.sodium chloride, sodium chloride, acetaminophen, docusate sodium, Glycerin (Adult), hydrALAZINE, HYDROcodone-acetaminophen, hydrOXYzine, nitroGLYCERIN, ondansetron (ZOFRAN) IV, sodium chloride flush, sodium chloride flush, zolpidem  DVT prophylaxis: heparin Code Status: DNR Family Communication: no family at bedside Disposition Plan: home when cleared by cardiology   Consultants:   Cardiology  Nephrology   Procedures:   2D echo:  Impressions: - Akinesis of the distal septum and apex; overall mild to moderate LV dysfunction; severe LVE; mild AS (  mean gradient 14 mmHg); severe MR; severe biatrial enlargement; moderate TR with mild pulmonary hypertension.  Antimicrobials:  None    Objective: Vitals:   06/28/18 1046 06/28/18 1247 06/28/18 1356 06/28/18 1400  BP:  (!) 124/57 110/64 110/64  Pulse: (!) 0 90 95 97  Resp:  (!) 0 (!) 21 18 (!) 23  Temp:  97.8 F (36.6 C) 97.7 F (36.5 C)   TempSrc:  Oral Oral   SpO2: (!) 0% 92% 93% 98%  Weight:      Height:        Intake/Output Summary (Last 24 hours) at 06/28/2018 1459 Last data filed at 06/28/2018 1348 Gross per 24 hour  Intake 440 ml  Output -  Net 440 ml   Filed Weights   06/27/18 0500 06/27/18 0900 06/28/18 0500  Weight: 114.9 kg 114.2 kg 113.9 kg    Examination: Constitutional: NAD Respiratory: CTA Cardiovascular: RRR   Data Reviewed: I have independently reviewed following labs and imaging studies   CBC: Recent Labs  Lab 06/23/18 0748 06/24/18 0331 06/25/18 0257 06/26/18 0339 06/27/18 0353  WBC 4.9 4.7 4.0 4.4 4.5  NEUTROABS 3.3  --   --  2.8  --   HGB 10.7* 11.3* 10.1* 10.3* 10.2*  HCT 33.3* 35.1* 32.0* 33.1* 32.2*  MCV 98.5 99.2 99.4 100.3* 100.0  PLT 111* 112* 87* 105* 939*   Basic Metabolic Panel: Recent Labs  Lab 06/21/18 1552 06/22/18 0059  06/24/18 0331 06/25/18 0257 06/26/18 0339 06/27/18 0353 06/28/18 0534  NA 140 139   < > 141 140 141 141 140  K 3.8 3.5   < > 4.1 4.0 4.3 4.1 4.4  CL 93* 93*   < > 97* 96* 99 99 100  CO2 30 35*   < > 34* 34* 35* 34* 32  GLUCOSE 80 95   < > 91 92 92 89 90  BUN 85* 79*   < > 73* 72* 73* 71* 73*  CREATININE 2.44* 2.56*   < > 2.30* 2.36* 2.57* 2.46* 2.46*  CALCIUM 9.9 9.4   < > 9.8 9.5 9.3 9.4 9.5  MG 2.7* 2.7*  --   --   --   --   --   --   PHOS  --   --   --   --   --  3.5  --   --    < > = values in this interval not displayed.   GFR: Estimated Creatinine Clearance: 26.1 mL/min (A) (by C-G formula based on SCr of 2.46 mg/dL (H)). Liver Function Tests: Recent Labs  Lab 06/26/18 0339  ALBUMIN 3.7   No results for input(s): LIPASE, AMYLASE in the last 168 hours. No results for input(s): AMMONIA in the last 168 hours. Coagulation Profile: Recent Labs  Lab 06/23/18 0748  INR 1.22   Cardiac Enzymes: No results for input(s): CKTOTAL, CKMB, CKMBINDEX, TROPONINI in  the last 168 hours. BNP (last 3 results) No results for input(s): PROBNP in the last 8760 hours. HbA1C: No results for input(s): HGBA1C in the last 72 hours. CBG: No results for input(s): GLUCAP in the last 168 hours. Lipid Profile: No results for input(s): CHOL, HDL, LDLCALC, TRIG, CHOLHDL, LDLDIRECT in the last 72 hours. Thyroid Function Tests: No results for input(s): TSH, T4TOTAL, FREET4, T3FREE, THYROIDAB in the last 72 hours. Anemia Panel: No results for input(s): VITAMINB12, FOLATE, FERRITIN, TIBC, IRON, RETICCTPCT in the last 72 hours. Urine analysis:    Component Value Date/Time  COLORURINE YELLOW 12/01/2016 [place] 12/01/2016 1224   LABSPEC 1.009 12/01/2016 1224   PHURINE 6.0 12/01/2016 1224   GLUCOSEU NEGATIVE 12/01/2016 1224   HGBUR SMALL (A) 12/01/2016 1224   BILIRUBINUR NEGATIVE 12/01/2016 1224   KETONESUR NEGATIVE 12/01/2016 1224   PROTEINUR NEGATIVE 12/01/2016 1224   UROBILINOGEN 0.2 03/06/2013 2035   NITRITE NEGATIVE 12/01/2016 1224   LEUKOCYTESUR LARGE (A) 12/01/2016 1224   Sepsis Labs: Invalid input(s): PROCALCITONIN, LACTICIDVEN  Recent Results (from the past 240 hour(s))  Blood culture (routine x 2)     Status: None   Collection Time: 06/21/18 12:49 AM  Result Value Ref Range Status   Specimen Description BLOOD LEFT WRIST  Final   Special Requests   Final    BOTTLES DRAWN AEROBIC AND ANAEROBIC Blood Culture results may not be optimal due to an excessive volume of blood received in culture bottles   Culture   Final    NO GROWTH 5 DAYS Performed at [facility] [address], [place], [place] [address]    Report Status 06/26/2018 FINAL  Final  Blood culture (routine x 2)     Status: None   Collection Time: 06/21/18 12:57 AM  Result Value Ref Range Status   Specimen Description BLOOD RIGHT WRIST  Final   Special Requests   Final    BOTTLES DRAWN AEROBIC AND ANAEROBIC Blood Culture results may not be optimal due to an  excessive volume of blood received in culture bottles   Culture   Final    NO GROWTH 5 DAYS Performed at [facility], [place] [address], [place], [place] [address]    Report Status 06/26/2018 FINAL  Final    Radiology Studies: No results found. Marzetta Board, MD, PhD [facility] Pager [phone]  If 7PM-7AM, please contact night-coverage [email or web address] Password [email or web address] 06/28/2018, 2:59 PM

## 2018-06-28 NOTE — Progress Notes (Signed)
  Echocardiogram Echocardiogram Transesophageal has been performed.  [name] [name] 06/28/2018, 2:03 PM

## 2018-06-28 NOTE — Transfer of Care (Signed)
Immediate Anesthesia Transfer of Care Note  Patient: [name] [name]  Procedure(s) Performed: TRANSESOPHAGEAL ECHOCARDIOGRAM (TEE) (N/A )  Patient Location: Endoscopy Unit  Anesthesia Type:MAC  Level of Consciousness: awake, alert  and oriented  Airway & Oxygen Therapy: Patient Spontanous Breathing and Patient connected to nasal cannula oxygen  Post-op Assessment: Report given to RN and Post -op Vital signs reviewed and stable  Post vital signs: Reviewed and stable  Last Vitals:  Vitals Value Taken Time  BP    Temp    Pulse 102 06/28/2018  1:57 PM  Resp 20 06/28/2018  1:57 PM  SpO2 93 % 06/28/2018  1:57 PM  Vitals shown include unvalidated device data.  Last Pain:  Vitals:   06/28/18 1247  TempSrc: Oral  PainSc: 5          Complications: No apparent anesthesia complications

## 2018-06-28 NOTE — Progress Notes (Addendum)
Advanced Heart Failure Rounding Note  PCP-Cardiologist: Dorris Carnes, MD   Subjective:    Yesterday diuresed with IV lasix. Weight unchanged.   Remains SOB with exertion. Denies orthopnea orthopnea or PND.   Objective:   Weight Range: 113.9 kg Body mass index is 40.54 kg/m.   Vital Signs:   Temp:  [97.6 F (36.4 C)-98.5 F (36.9 C)] 97.6 F (36.4 C) (10/04 0748) Pulse Rate:  [97-119] 119 (10/04 0748) Resp:  [16] 16 (10/03 2327) BP: (99-107)/(61-68) 99/68 (10/04 0748) SpO2:  [94 %-99 %] 94 % (10/04 0748) Weight:  [113.9 kg] 113.9 kg (10/04 0500) Last BM Date: 06/21/18  Weight change: Filed Weights   06/27/18 0500 06/27/18 0900 06/28/18 0500  Weight: 114.9 kg 114.2 kg 113.9 kg    Intake/Output:   Intake/Output Summary (Last 24 hours) at 06/28/2018 0927 Last data filed at 06/27/2018 2120 Gross per 24 hour  Intake 240 ml  Output 300 ml  Net -60 ml      Physical Exam    General:  Eldelry No resp difficulty HEENT: normal Neck: supple. JVP to jaw Carotids 2+ bilat; no bruits. No lymphadenopathy or thryomegaly appreciated. Cor: PMI nondisplaced. Regular rate & rhythm. No rubs, gallops or murmurs. Lungs: clear Abdomen: obese soft, nontender, nondistended. No hepatosplenomegaly. No bruits or masses. Good bowel sounds. Extremities: no cyanosis, clubbing, rash, R and LLE chronic hyperpigmentation trace edema Neuro: alert & orientedx3, cranial nerves grossly intact. moves all 4 extremities w/o difficulty. Affect pleasant   Telemetry   V Paced 90s  Personally reviewed   EKG    No new tracings.   Labs    CBC Recent Labs    06/26/18 0339 06/27/18 0353  WBC 4.4 4.5  NEUTROABS 2.8  --   HGB 10.3* 10.2*  HCT 33.1* 32.2*  MCV 100.3* 100.0  PLT 105* 413*   Basic Metabolic Panel Recent Labs    06/26/18 0339 06/27/18 0353 06/28/18 0534  NA 141 141 140  K 4.3 4.1 4.4  CL 99 99 100  CO2 35* 34* 32  GLUCOSE 92 89 90  BUN 73* 71* 73*  CREATININE  2.57* 2.46* 2.46*  CALCIUM 9.3 9.4 9.5  PHOS 3.5  --   --    Liver Function Tests Recent Labs    06/26/18 0339  ALBUMIN 3.7   No results for input(s): LIPASE, AMYLASE in the last 72 hours. Cardiac Enzymes No results for input(s): CKTOTAL, CKMB, CKMBINDEX, TROPONINI in the last 72 hours.  BNP: BNP (last 3 results) Recent Labs    06/20/18 2249  BNP 160.6*    ProBNP (last 3 results) No results for input(s): PROBNP in the last 8760 hours.   D-Dimer No results for input(s): DDIMER in the last 72 hours. Hemoglobin A1C No results for input(s): HGBA1C in the last 72 hours. Fasting Lipid Panel No results for input(s): CHOL, HDL, LDLCALC, TRIG, CHOLHDL, LDLDIRECT in the last 72 hours. Thyroid Function Tests No results for input(s): TSH, T4TOTAL, T3FREE, THYROIDAB in the last 72 hours.  Invalid input(s): FREET3  Other results:   Imaging    No results found.   Medications:     Scheduled Medications: . allopurinol  100 mg Oral BID  . aspirin  325 mg Oral QPM  . calcitRIOL  0.25 mcg Oral QODAY  . calcium carbonate  1,250 mg Oral Q lunch  . ferrous sulfate  325 mg Oral QPM  . furosemide  80 mg Intravenous Daily  . HYDROcodone-acetaminophen  1  tablet Oral TID  . isosorbide mononitrate  15 mg Oral Daily  . lactose free nutrition  237 mL Oral TID WC  . multivitamin with minerals  1 tablet Oral Daily  . neomycin-bacitracin-polymyxin   Topical Daily  . nystatin   Topical Daily  . polyethylene glycol  17 g Oral QODAY  . potassium chloride  20 mEq Oral BID  . sodium chloride flush  3 mL Intravenous Q12H  . vitamin B-12  500 mcg Oral Daily    Infusions: . sodium chloride    . sodium chloride 10 mL/hr at 06/28/18 0636    PRN Medications: sodium chloride, acetaminophen, docusate sodium, Glycerin (Adult), hydrALAZINE, HYDROcodone-acetaminophen, hydrOXYzine, nitroGLYCERIN, sodium chloride flush, zolpidem    Patient Profile   [name] [name] is a 72 y.o. female  with a history of morbid obesity, afib s/p AV ablation and [employer] PPM, chronic combined HF, CKD IV, MR, TR, pulmonary HTN, OSA on BiPAP, GERD, gout, iron deficiency anemia.  Admitted 06/20/18 with CP and SOB.   Assessment/Plan   1. Acute on chronic combined HF - Echo 06/21/18: EF 40-45%, LV severely dilated, mild LVH, akinesis of mid-apicalanteroseptal and apical myocardium, mild AS, severe MR, LA and RA severely dilated, moderate TR, PA peak pressure 42 mmHg - RHC this morning. Hold IV lasix and reassess after cath.   - Continue hydral/imdur - No spiro with CKD - No BB with volume overload. - Discussed request for strict I/O and standings weights with RN.  2. Severe MR  - Severe MR on echo 06/21/18 (mild on echo 10/2016) - TEE RHC to assess for mitral clip.  - 3. Afib s/p AV ablation and PPM - Not on AC. No change. - She is Vpaced with back up rate of 90, which Dr Lovena Le and pt have agreed on. She does not want it any lower.   4. OSA - Wears BiPAP qHS   5. CKD 3 - Unclear baseline, seems to be ~2.5.  - Creatinine unchanged 2.46.  - Follow BMET   Plan for TEE/ RHC today.    Length of Stay: Brent, NP  06/28/2018, 9:27 AM  Advanced Heart Failure Team Pager [phone] (M-F; 7a - 4p)  Please contact [facility] for night-coverage after hours (4p -7a ) and weekends on [email or web address]  Patient seen and examined with Darrick Grinder, NP. We discussed all aspects of the encounter. I agree with the assessment and plan as stated above.   Symptomatically stable. Case reviewed in detail with Dr. Burt Knack. May be candidate for MitraClip. Will plan TEE and RHC today. Case discussed at length with her daughters by phone. I suspect the only way we will be able to stabilize her HF symptoms will be with clipping if she is a candidate. Creating elevated but stable. Will need to re-discuss [facility] for her a fib. Has not been anticoagulated previously.   Glori Bickers, MD  10:09 AM

## 2018-06-28 NOTE — Progress Notes (Signed)
[facility] ROUNDING NOTE   Subjective:   RHC today with elevated filling pressures. Additional diuresis recommended. TEE has just been completed.     Objective:  Vital signs in last 24 hours:  Temp:  [97.6 F (36.4 C)-98.5 F (36.9 C)] 97.7 F (36.5 C) (10/04 1356) Pulse Rate:  [0-119] 97 (10/04 1400) Resp:  [0-31] 23 (10/04 1400) BP: (99-130)/(57-75) 110/64 (10/04 1400) SpO2:  [0 %-99 %] 98 % (10/04 1400) Weight:  [113.9 kg] 113.9 kg (10/04 0500)  Weight change: -0.771 kg Filed Weights   06/27/18 0500 06/27/18 0900 06/28/18 0500  Weight: 114.9 kg 114.2 kg 113.9 kg    Intake/Output: I/O last 3 completed shifts: In: 480 [P.O.:480] Out: 850 [Urine:850]   Intake/Output this shift:  Total I/O In: 200 [I.V.:200] Out: -  Gen - lethargic after TEE Lungs - normal WOB today ABD- BS present soft non-distended EXT-Trace to 1+ edema BL with ruddy scaling discoloration L > R which she says is chronic - stable c/w yesterday   Basic Metabolic Panel: Recent Labs  Lab 06/21/18 1552 06/22/18 0059  06/24/18 0331 06/25/18 0257 06/26/18 0339 06/27/18 0353 06/28/18 0534  NA 140 139   < > 141 140 141 141 140  K 3.8 3.5   < > 4.1 4.0 4.3 4.1 4.4  CL 93* 93*   < > 97* 96* 99 99 100  CO2 30 35*   < > 34* 34* 35* 34* 32  GLUCOSE 80 95   < > 91 92 92 89 90  BUN 85* 79*   < > 73* 72* 73* 71* 73*  CREATININE 2.44* 2.56*   < > 2.30* 2.36* 2.57* 2.46* 2.46*  CALCIUM 9.9 9.4   < > 9.8 9.5 9.3 9.4 9.5  MG 2.7* 2.7*  --   --   --   --   --   --   PHOS  --   --   --   --   --  3.5  --   --    < > = values in this interval not displayed.    Liver Function Tests: Recent Labs  Lab 06/26/18 0339  ALBUMIN 3.7   No results for input(s): LIPASE, AMYLASE in the last 168 hours. No results for input(s): AMMONIA in the last 168 hours.  CBC: Recent Labs  Lab 06/23/18 0748 06/24/18 0331 06/25/18 0257 06/26/18 0339 06/27/18 0353  WBC 4.9 4.7 4.0 4.4 4.5  NEUTROABS 3.3   --   --  2.8  --   HGB 10.7* 11.3* 10.1* 10.3* 10.2*  HCT 33.3* 35.1* 32.0* 33.1* 32.2*  MCV 98.5 99.2 99.4 100.3* 100.0  PLT 111* 112* 87* 105* 106*    Cardiac Enzymes: No results for input(s): CKTOTAL, CKMB, CKMBINDEX, TROPONINI in the last 168 hours.  BNP: Invalid input(s): POCBNP  CBG: No results for input(s): GLUCAP in the last 168 hours.  Microbiology: Results for orders placed or performed during the hospital encounter of 06/20/18  Blood culture (routine x 2)     Status: None   Collection Time: 06/21/18 12:49 AM  Result Value Ref Range Status   Specimen Description BLOOD LEFT WRIST  Final   Special Requests   Final    BOTTLES DRAWN AEROBIC AND ANAEROBIC Blood Culture results may not be optimal due to an excessive volume of blood received in culture bottles   Culture   Final    NO GROWTH 5 DAYS Performed at [facility], [place]  [address], [place], [place] [address]    Report Status 06/26/2018 FINAL  Final  Blood culture (routine x 2)     Status: None   Collection Time: 06/21/18 12:57 AM  Result Value Ref Range Status   Specimen Description BLOOD RIGHT WRIST  Final   Special Requests   Final    BOTTLES DRAWN AEROBIC AND ANAEROBIC Blood Culture results may not be optimal due to an excessive volume of blood received in culture bottles   Culture   Final    NO GROWTH 5 DAYS Performed at [facility], [place] [address] [place], [place] [address]    Report Status 06/26/2018 FINAL  Final    Coagulation Studies: No results for input(s): LABPROT, INR in the last 72 hours.  Urinalysis: No results for input(s): COLORURINE, LABSPEC, PHURINE, GLUCOSEU, HGBUR, BILIRUBINUR, KETONESUR, PROTEINUR, UROBILINOGEN, NITRITE, LEUKOCYTESUR in the last 72 hours.  Invalid input(s): APPERANCEUR    Imaging: No results found.   Medications:   . sodium chloride    . sodium chloride    . furosemide     . allopurinol  100 mg Oral BID  . aspirin  325 mg Oral QPM  .  calcitRIOL  0.25 mcg Oral QODAY  . calcium carbonate  1,250 mg Oral Q lunch  . [START ON 06/29/2018] enoxaparin (LOVENOX) injection  30 mg Subcutaneous Q24H  . ferrous sulfate  325 mg Oral QPM  . HYDROcodone-acetaminophen  1 tablet Oral TID  . isosorbide mononitrate  15 mg Oral Daily  . lactose free nutrition  237 mL Oral TID WC  . metolazone  2.5 mg Oral Once  . multivitamin with minerals  1 tablet Oral Daily  . neomycin-bacitracin-polymyxin   Topical Daily  . nystatin   Topical Daily  . polyethylene glycol  17 g Oral QODAY  . potassium chloride  20 mEq Oral BID  . sodium chloride flush  3 mL Intravenous Q12H  . sodium chloride flush  3 mL Intravenous Q12H  . vitamin B-12  500 mcg Oral Daily   sodium chloride, sodium chloride, acetaminophen, docusate sodium, Glycerin (Adult), hydrALAZINE, HYDROcodone-acetaminophen, hydrOXYzine, nitroGLYCERIN, ondansetron (ZOFRAN) IV, sodium chloride flush, sodium chloride flush, zolpidem  Assessment/ Plan:   Chronic renal insufficiency stage IV baseline creatinine 2.5-3.0 with currently stable renal function.  There is no use nonsteroidal inflammatories Cox 2 inhibitors of ACE inhibitors, no IV contrast administration.  Will continue to follow in setting of continued diuresis and if mitral clip procedure is performed.    AoCHF: clinically felt back to baseline but in setting of elevated filling pressures on RHC cardiology has added metolazone 2.5 to her diuretic regimen here (she takes this PRN at home).   Dyspnea:   CXR 10/2 with small R effusion o/w improved c/w admission.  Dyspnea improved in past few days.  Valvular heart disease thought to be a strong contributor as well.  Hypertension/volume appears adequately controlled with diuretic therapy and current medictions  Anemia and stable no ESA at this time continues oral ferrous sulfate  BMM Calcitrol 0.25 mcg daily she uses calcium carbonate once daily  Mitral regurgitation followed by cardiology  according to Dr. Tamala Julian she may be a candidate for mitraClip -- see above, eval underway  Congestive heart failure h/o  EF 25%, TTE 9/29 EF 40-45%  History of atrial fibrillation is refused anticoagulation  Obstructive sleep apnea continues on BiPAP with home machine   LOS: 6 Jannifer Hick A @TODAY @2 :35 PM

## 2018-06-28 NOTE — Anesthesia Procedure Notes (Signed)
Procedure Name: General with mask airway Date/Time: 06/28/2018 1:29 PM Performed by: Marsa Aris, CRNA Pre-anesthesia Checklist: Timeout performed, Patient being monitored, Suction available, Emergency Drugs available and Patient identified Patient Re-evaluated:Patient Re-evaluated prior to induction Oxygen Delivery Method: Circle system utilized and Nasal cannula Preoxygenation: Pre-oxygenation with 100% oxygen

## 2018-06-29 ENCOUNTER — Encounter (HOSPITAL_COMMUNITY): Payer: Self-pay | Admitting: Internal Medicine

## 2018-06-29 LAB — BASIC METABOLIC PANEL
Anion gap: 11 (ref 5–15)
BUN: 72 mg/dL — ABNORMAL HIGH (ref 8–23)
CALCIUM: 9.5 mg/dL (ref 8.9–10.3)
CHLORIDE: 96 mmol/L — AB (ref 98–111)
CO2: 33 mmol/L — AB (ref 22–32)
CREATININE: 2.48 mg/dL — AB (ref 0.44–1.00)
GFR calc non Af Amer: 18 mL/min — ABNORMAL LOW (ref >60)
GFR, EST AFRICAN AMERICAN: 21 mL/min — AB (ref >60)
Glucose, Bld: 83 mg/dL (ref 70–99)
Potassium: 4.2 mmol/L (ref 3.5–5.1)
SODIUM: 140 mmol/L (ref 135–145)

## 2018-06-29 MED ORDER — SENNA 8.6 MG PO TABS
1.0000 | ORAL_TABLET | Freq: Once | ORAL | Status: AC
  Administered 2018-06-29: 8.6 mg via ORAL
  Filled 2018-06-29: qty 1

## 2018-06-29 MED ORDER — VITAMIN B-12 1000 MCG PO TABS
500.0000 ug | ORAL_TABLET | Freq: Every day | ORAL | Status: DC
  Administered 2018-06-29 – 2018-07-02 (×4): 500 ug via ORAL
  Filled 2018-06-29 (×4): qty 1

## 2018-06-29 MED ORDER — SENNOSIDES-DOCUSATE SODIUM 8.6-50 MG PO TABS
1.0000 | ORAL_TABLET | Freq: Every day | ORAL | Status: DC
  Administered 2018-06-29: 1 via ORAL
  Filled 2018-06-29 (×2): qty 1

## 2018-06-29 MED ORDER — POLYETHYLENE GLYCOL 3350 17 G PO PACK
17.0000 g | PACK | Freq: Every day | ORAL | Status: DC
  Administered 2018-06-30 – 2018-07-02 (×3): 17 g via ORAL
  Filled 2018-06-29 (×3): qty 1

## 2018-06-29 NOTE — Progress Notes (Signed)
Pt stated her daughter was going to set everything up for her bipap machine tonight. Rt made pt aware if she needed anything to call. RT will continue to monitor as needed.

## 2018-06-29 NOTE — Progress Notes (Signed)
[facility] ROUNDING NOTE   Subjective:   Diuresing well, creatinine stable.  Dyspnea improved.    Objective:  Vital signs in last 24 hours:  Temp:  [97.8 F (36.6 C)] 97.8 F (36.6 C) (10/05 0728) Pulse Rate:  [94-98] 94 (10/05 0728) BP: (95-139)/(63-93) 95/63 (10/05 0728) SpO2:  [92 %-94 %] 94 % (10/05 0728) Weight:  [112.3 kg] 112.3 kg (10/05 0500)  Weight change: -1.87 kg Filed Weights   06/27/18 0900 06/28/18 0500 06/29/18 0500  Weight: 114.2 kg 113.9 kg 112.3 kg    Intake/Output: I/O last 3 completed shifts: In: 1092 [P.O.:480; I.V.:260; IV Piggyback:352] Out: 2700 [Urine:2700]   Intake/Output this shift:  Total I/O In: 250.3 [P.O.:240; IV Piggyback:10.3] Out: -  Gen - sleeping comfortably, awakens easily Lungs - normal WOB today ABD- BS present soft non-distended EXT-Trace to 1+ edema BL with ruddy scaling discoloration L > R which she says is chronic - slightly improved c/w yesterday   Basic Metabolic Panel: Recent Labs  Lab 06/25/18 0257 06/26/18 0339 06/27/18 0353 06/28/18 0534 06/29/18 0212  NA 140 141 141 140 140  K 4.0 4.3 4.1 4.4 4.2  CL 96* 99 99 100 96*  CO2 34* 35* 34* 32 33*  GLUCOSE 92 92 89 90 83  BUN 72* 73* 71* 73* 72*  CREATININE 2.36* 2.57* 2.46* 2.46* 2.48*  CALCIUM 9.5 9.3 9.4 9.5 9.5  PHOS  --  3.5  --   --   --     Liver Function Tests: Recent Labs  Lab 06/26/18 0339  ALBUMIN 3.7   No results for input(s): LIPASE, AMYLASE in the last 168 hours. No results for input(s): AMMONIA in the last 168 hours.  CBC: Recent Labs  Lab 06/23/18 0748 06/24/18 0331 06/25/18 0257 06/26/18 0339 06/27/18 0353  WBC 4.9 4.7 4.0 4.4 4.5  NEUTROABS 3.3  --   --  2.8  --   HGB 10.7* 11.3* 10.1* 10.3* 10.2*  HCT 33.3* 35.1* 32.0* 33.1* 32.2*  MCV 98.5 99.2 99.4 100.3* 100.0  PLT 111* 112* 87* 105* 106*    Cardiac Enzymes: No results for input(s): CKTOTAL, CKMB, CKMBINDEX, TROPONINI in the last 168  hours.  BNP: Invalid input(s): POCBNP  CBG: No results for input(s): GLUCAP in the last 168 hours.  Microbiology: Results for orders placed or performed during the hospital encounter of 06/20/18  Blood culture (routine x 2)     Status: None   Collection Time: 06/21/18 12:49 AM  Result Value Ref Range Status   Specimen Description BLOOD LEFT WRIST  Final   Special Requests   Final    BOTTLES DRAWN AEROBIC AND ANAEROBIC Blood Culture results may not be optimal due to an excessive volume of blood received in culture bottles   Culture   Final    NO GROWTH 5 DAYS Performed at [facility], [place] [address], [place], [place] [address]    Report Status 06/26/2018 FINAL  Final  Blood culture (routine x 2)     Status: None   Collection Time: 06/21/18 12:57 AM  Result Value Ref Range Status   Specimen Description BLOOD RIGHT WRIST  Final   Special Requests   Final    BOTTLES DRAWN AEROBIC AND ANAEROBIC Blood Culture results may not be optimal due to an excessive volume of blood received in culture bottles   Culture   Final    NO GROWTH 5 DAYS Performed at [facility], [place],  [place] [address]    Report Status 06/26/2018 FINAL  Final    Coagulation Studies: No results for input(s): LABPROT, INR in the last 72 hours.  Urinalysis: No results for input(s): COLORURINE, LABSPEC, PHURINE, GLUCOSEU, HGBUR, BILIRUBINUR, KETONESUR, PROTEINUR, UROBILINOGEN, NITRITE, LEUKOCYTESUR in the last 72 hours.  Invalid input(s): APPERANCEUR    Imaging: No results found.   Medications:   . sodium chloride    . sodium chloride    . furosemide 120 mg (06/29/18 0750)   . allopurinol  100 mg Oral BID  . aspirin  325 mg Oral QPM  . calcitRIOL  0.25 mcg Oral QODAY  . calcium carbonate  1,250 mg Oral Q lunch  . enoxaparin (LOVENOX) injection  30 mg Subcutaneous Q24H  . ferrous sulfate  325 mg Oral QPM  . HYDROcodone-acetaminophen  1 tablet Oral TID  . isosorbide  mononitrate  15 mg Oral Daily  . lactose free nutrition  237 mL Oral TID WC  . multivitamin with minerals  1 tablet Oral Daily  . neomycin-bacitracin-polymyxin   Topical Daily  . nystatin   Topical Daily  . [START ON 06/30/2018] polyethylene glycol  17 g Oral Daily  . potassium chloride  20 mEq Oral BID  . senna-docusate  1 tablet Oral QHS  . sodium chloride flush  3 mL Intravenous Q12H  . sodium chloride flush  3 mL Intravenous Q12H  . vitamin B-12  500 mcg Oral Daily   sodium chloride, sodium chloride, acetaminophen, Glycerin (Adult), hydrALAZINE, HYDROcodone-acetaminophen, hydrOXYzine, nitroGLYCERIN, ondansetron (ZOFRAN) IV, sodium chloride flush, sodium chloride flush, zolpidem  Assessment/ Plan:   Chronic renal insufficiency stage IV baseline creatinine 2.5-3.0 with currently stable renal function even in face of diuresis yesterday which continues today.  CTM.    AoC combined HF (EF 05/2009 40-45%): had RHC and TTE yesterday with restrictive cardiomyopathy physiology, elevated filling pressures, severe TR, MR.  Diuresing.  Cardiology considering mitral clip.    Dyspnea:   CXR 10/2 with small R effusion o/w improved c/w admission.  Dyspnea improved in past few days with diuresis  Hypertension: off hydralazine currently with BPs lower; on imdur 15  Anemia Hb stable no ESA at this time continues oral ferrous sulfate  BMM Calcitrol 0.25 mcg daily she uses calcium carbonate once daily  History of atrial fibrillation has refused anticoagulation  Obstructive sleep apnea continues on BiPAP with home machine   LOS: 7 [name] [name] A @TODAY @2 :04 PM

## 2018-06-29 NOTE — Progress Notes (Signed)
Advanced Heart Failure Rounding Note  PCP-Cardiologist: Dorris Carnes, MD   Subjective:    Diuresed well yesterday, weight down 4 lbs. Less short of breath.    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   Objective:   Weight Range: 112.3 kg Body mass index is 39.96 kg/m.   Vital Signs:   Temp:  [97.7 F (36.5 C)-97.8 F (36.6 C)] 97.8 F (36.6 C) (10/05 0728) Pulse Rate:  [90-98] 94 (10/05 0728) Resp:  [18-23] 23 (10/04 1400) BP: (95-139)/(57-93) 95/63 (10/05 0728) SpO2:  [92 %-98 %] 94 % (10/05 0728) Weight:  [112.3 kg] 112.3 kg (10/05 0500) Last BM Date: 06/27/18  Weight change: Filed Weights   06/27/18 0900 06/28/18 0500 06/29/18 0500  Weight: 114.2 kg 113.9 kg 112.3 kg    Intake/Output:   Intake/Output Summary (Last 24 hours) at 06/29/2018 1053 Last data filed at 06/29/2018 0800 Gross per 24 hour  Intake 1102.3 ml  Output 2700 ml  Net -1597.7 ml      Physical Exam    General: NAD Neck: JVP 16 cm with prominent CV waves, no thyromegaly or thyroid nodule.  Lungs: Mildly decreased BS at bases.  CV: Nondisplaced PMI.  Heart regular S1/S2, no S3/S4, 3/6 HSM LLSB/apex. 1+ edema to knees.   Abdomen: Soft, nontender, no hepatosplenomegaly, no distention.  Skin: Intact without lesions or rashes.  Neurologic: Alert and oriented x 3.  Psych: Normal affect. Extremities: No clubbing or cyanosis.  HEENT: Normal.   Telemetry   V Paced 90s (personally reviewed)  EKG    No new tracings.   Labs    CBC Recent Labs    06/27/18 0353  WBC 4.5  HGB 10.2*  HCT 32.2*  MCV 100.0  PLT 646*   Basic Metabolic Panel Recent Labs    06/28/18 0534 06/29/18 0212  NA 140 140  K 4.4 4.2  CL  100 96*  CO2 32 33*  GLUCOSE 90 83  BUN 73* 72*  CREATININE 2.46* 2.48*  CALCIUM 9.5 9.5   Liver Function Tests No results for input(s): AST, ALT, ALKPHOS, BILITOT, PROT, ALBUMIN in the last 72 hours. No results for input(s): LIPASE, AMYLASE in the last 72 hours. Cardiac Enzymes No results for input(s): CKTOTAL, CKMB, CKMBINDEX, TROPONINI in the last 72 hours.  BNP: BNP (last 3 results) Recent Labs    06/20/18 2249  BNP 160.6*    ProBNP (last 3 results) No results for input(s): PROBNP in the last 8760 hours.   D-Dimer No results for input(s): DDIMER in the last 72 hours. Hemoglobin A1C No results for input(s): HGBA1C in the last 72 hours. Fasting Lipid Panel No results for input(s): CHOL, HDL, LDLCALC, TRIG, CHOLHDL, LDLDIRECT in the last 72 hours. Thyroid Function Tests No results for input(s): TSH, T4TOTAL, T3FREE, THYROIDAB in the last 72 hours.  Invalid input(s): FREET3  Other results:   Imaging    No results found.   Medications:     Scheduled Medications: . allopurinol  100 mg Oral BID  . aspirin  325 mg Oral QPM  . calcitRIOL  0.25 mcg Oral QODAY  . calcium carbonate  1,250 mg Oral Q lunch  . enoxaparin (LOVENOX)  injection  30 mg Subcutaneous Q24H  . ferrous sulfate  325 mg Oral QPM  . HYDROcodone-acetaminophen  1 tablet Oral TID  . isosorbide mononitrate  15 mg Oral Daily  . lactose free nutrition  237 mL Oral TID WC  . multivitamin with minerals  1 tablet Oral Daily  . neomycin-bacitracin-polymyxin   Topical Daily  . nystatin   Topical Daily  . [START ON 06/30/2018] polyethylene glycol  17 g Oral Daily  . potassium chloride  20 mEq Oral BID  . senna  1 tablet Oral Once  . senna-docusate  1 tablet Oral QHS  . sodium chloride flush  3 mL Intravenous Q12H  . sodium chloride flush  3 mL Intravenous Q12H  . vitamin B-12  500 mcg Oral Daily    Infusions: . sodium chloride    . sodium chloride    . furosemide 120 mg (06/29/18 0750)    PRN  Medications: sodium chloride, sodium chloride, acetaminophen, Glycerin (Adult), hydrALAZINE, HYDROcodone-acetaminophen, hydrOXYzine, nitroGLYCERIN, ondansetron (ZOFRAN) IV, sodium chloride flush, sodium chloride flush, zolpidem    Patient Profile   [name] [name] is a 72 y.o. female with a history of morbid obesity, afib s/p AV ablation and St Jude PPM, chronic combined HF, CKD IV, MR, TR, pulmonary HTN, OSA on BiPAP, GERD, gout, iron deficiency anemia.  Admitted 06/20/18 with CP and SOB.   Assessment/Plan   1. Acute on chronic combined HF: Echo 06/21/18 with EF 40-45%, LV severely dilated, mild LVH, akinesis of mid-apicalanteroseptal and apical myocardium, mild AS, severe MR, LA and RA severely dilated, moderate TR, PA peak pressure 42 mmHg.  Cause of cardiomyopathy uncertain.  Could be valvular, could be due to chronic RV pacing (has not had coronary angiography that I can see and now with CKD stage IV).  [place] 10/4 showed preserved cardiac output with markedly elevated right and left heart filling pressures (very prominent RV failure).  TEE looked like restrictive cardiomyopathy.  She is diuresing well with stable creatinine.  Severe TR will make volume estimation difficult.  - Continue Lasix 120 mg IV bid today.   - Off hydralazine with soft BP, Imdur continues.  - No spiro/ARB/ACEI/ARNI with CKD stage IV.  - No BB with volume overload. - It is possible that correction of mitral regurgitation could help her overall CHF picture and lessen the degree of RV failure that she has.   2. Mitral regurgitation: Severe MR on echo 06/21/18 (mild on echo 10/2016).  TEE on 10/4 showed moderate-severe central MR with some restriction of posterior leaflet.  Tricuspid regurgitation was more impressive.  Will review with structural heart team regarding benefit from Mitraclip.  It is possible that decreasing the MR may help mitigate her right heart failure.  3. Afib s/p AV nodal ablation and PPM: Chronic RV  pacing in the 90s.   - With dyssynchrony and decreased LV EF, could consider CRT upgrade at some point.  - She refuses anticoagulation ("caused my teeth to fall out and my hair to fall out").  She will only take ASA.  4. OSA: Wears BiPAP qHS 5. CKD Stage 4: Baseline creatinine around 2.5.  She is stable so far with diuresis.  Follow closely.  6. Tricuspid regurgitation: Severe on TEE with prominent CV waves on exam.  Question here will be whether improving MR and lowering PCWP/PA pressures will help with TR and right-sided failure.   Length of Stay: 7  Loralie Champagne, MD  06/29/2018, 10:53 AM  Advanced Heart Failure  Team Pager [phone] (M-F; [place])  Please contact [facility] for night-coverage after hours (4p -7a ) and weekends on [email or web address]

## 2018-06-29 NOTE — Progress Notes (Signed)
PROGRESS NOTE  [name] [name] STM:[id number] DOB: May 05, 1946 DOA: 06/20/2018 PCP: [name], MD   LOS: 7 days   Brief Narrative / Interim history: 72 year old female with history of chronic systolic CHF with EF of 89%, atrial fibrillation, severe mitral regurgitation, chronic kidney disease stage IV, liver cirrhosis, who was admitted to the hospital with chief complaint of shortness of breath, she was found to be fluid overloaded and was placed on diuresis and cardiology was consulted.  Due to her chronic kidney disease and renal failure nephrology has been consulted as well.  Subjective: -Sitting at the edge of the bed and eating breakfast, no complaints, breathing is okay at rest.  She is happy about losing the fluid, down about 4 pounds  Assessment & Plan: Principal Problem:   Acute on chronic combined systolic and diastolic CHF (congestive heart failure) (HCC) Active Problems:   Essential hypertension   Permanent atrial fibrillation   PPM-St.Jude after AV node ablation   CKD (chronic kidney disease), stage IV (HCC)   Chest pain   Syncope   Iron deficiency   Acute on chronic combined systolic and diastolic CHF /severe mitral regurgitation/severe TR -Appreciate cardiology consultation, heart failure team following, right heart cath with increased pressures and she was placed high-dose Lasix, renal function stable today with good urine output -Structural cardiology team to weigh in regarding she would benefit from mitral clip and whether that will help the right side  Chronic kidney disease stage IV -Creatinine remains stable, appreciate nephrology follow-up  OSA -Continue BiPAP, compliant in the hospital  Left great toe injury -Continue topical ointment  Concern for pneumonia -Family had concerns for pneumonia based on the chest x-ray on admission, however she has no leukocytosis, afebrile, no productive cough, now monitor off antibiotics -Remains afebrile and without  issues  History of AV node ablation for A. fib status post pacemaker -Stable, plans per cardiology, patient refuses anticoagulation  Liver cirrhosis -Platelets stable, will recheck in the morning,  Scheduled Meds: . allopurinol  100 mg Oral BID  . aspirin  325 mg Oral QPM  . calcitRIOL  0.25 mcg Oral QODAY  . calcium carbonate  1,250 mg Oral Q lunch  . enoxaparin (LOVENOX) injection  30 mg Subcutaneous Q24H  . ferrous sulfate  325 mg Oral QPM  . HYDROcodone-acetaminophen  1 tablet Oral TID  . isosorbide mononitrate  15 mg Oral Daily  . lactose free nutrition  237 mL Oral TID WC  . multivitamin with minerals  1 tablet Oral Daily  . neomycin-bacitracin-polymyxin   Topical Daily  . nystatin   Topical Daily  . [START ON 06/30/2018] polyethylene glycol  17 g Oral Daily  . potassium chloride  20 mEq Oral BID  . senna-docusate  1 tablet Oral QHS  . sodium chloride flush  3 mL Intravenous Q12H  . sodium chloride flush  3 mL Intravenous Q12H  . vitamin B-12  500 mcg Oral Daily   Continuous Infusions: . sodium chloride    . sodium chloride    . furosemide 120 mg (06/29/18 0750)   PRN Meds:.sodium chloride, sodium chloride, acetaminophen, Glycerin (Adult), hydrALAZINE, HYDROcodone-acetaminophen, hydrOXYzine, nitroGLYCERIN, ondansetron (ZOFRAN) IV, sodium chloride flush, sodium chloride flush, zolpidem  DVT prophylaxis: heparin Code Status: DNR Family Communication: no family at bedside Disposition Plan: home when cleared by cardiology   Consultants:   Cardiology  Nephrology   Procedures:   2D echo:  Impressions: - Akinesis of the distal septum and apex; overall mild to moderate LV  dysfunction; severe LVE; mild AS (mean gradient 14 mmHg); severe MR; severe biatrial enlargement; moderate TR with mild pulmonary hypertension.  Antimicrobials:  None    Objective: Vitals:   06/28/18 1400 06/28/18 1732 06/29/18 0500 06/29/18 0728  BP: 110/64 (!) 139/93  95/63  Pulse: 97 98   94  Resp: (!) 23     Temp:  97.8 F (36.6 C)  97.8 F (36.6 C)  TempSrc:  Oral  Oral  SpO2: 98% 92%  94%  Weight:   112.3 kg   Height:        Intake/Output Summary (Last 24 hours) at 06/29/2018 1318 Last data filed at 06/29/2018 0800 Gross per 24 hour  Intake 1102.3 ml  Output 2700 ml  Net -1597.7 ml   Filed Weights   06/27/18 0900 06/28/18 0500 06/29/18 0500  Weight: 114.2 kg 113.9 kg 112.3 kg    Examination: Constitutional: NAD Eyes: PERRL, lids and conjunctivae normal ENMT: Mucous membranes are moist.  Neck: normal, supple Respiratory: Clear bilaterally without wheezing or crackles heard, good inspiratory effort Cardiovascular: Regular rate and rhythm, 3/6 SEM, trace lower extremity edema, + JVD Abdomen: no tenderness. Bowel sounds positive.  Skin: no rashes, lesions, ulcers.  Chronic venous stasis changes bilateral lower extremities Neurologic: non focal    Data Reviewed: I have independently reviewed following labs and imaging studies   CBC: Recent Labs  Lab 06/23/18 0748 06/24/18 0331 06/25/18 0257 06/26/18 0339 06/27/18 0353  WBC 4.9 4.7 4.0 4.4 4.5  NEUTROABS 3.3  --   --  2.8  --   HGB 10.7* 11.3* 10.1* 10.3* 10.2*  HCT 33.3* 35.1* 32.0* 33.1* 32.2*  MCV 98.5 99.2 99.4 100.3* 100.0  PLT 111* 112* 87* 105* 481*   Basic Metabolic Panel: Recent Labs  Lab 06/25/18 0257 06/26/18 0339 06/27/18 0353 06/28/18 0534 06/29/18 0212  NA 140 141 141 140 140  K 4.0 4.3 4.1 4.4 4.2  CL 96* 99 99 100 96*  CO2 34* 35* 34* 32 33*  GLUCOSE 92 92 89 90 83  BUN 72* 73* 71* 73* 72*  CREATININE 2.36* 2.57* 2.46* 2.46* 2.48*  CALCIUM 9.5 9.3 9.4 9.5 9.5  PHOS  --  3.5  --   --   --    GFR: Estimated Creatinine Clearance: 25.7 mL/min (A) (by C-G formula based on SCr of 2.48 mg/dL (H)). Liver Function Tests: Recent Labs  Lab 06/26/18 0339  ALBUMIN 3.7   No results for input(s): LIPASE, AMYLASE in the last 168 hours. No results for input(s): AMMONIA in the  last 168 hours. Coagulation Profile: Recent Labs  Lab 06/23/18 0748  INR 1.22   Cardiac Enzymes: No results for input(s): CKTOTAL, CKMB, CKMBINDEX, TROPONINI in the last 168 hours. BNP (last 3 results) No results for input(s): PROBNP in the last 8760 hours. HbA1C: No results for input(s): HGBA1C in the last 72 hours. CBG: No results for input(s): GLUCAP in the last 168 hours. Lipid Profile: No results for input(s): CHOL, HDL, LDLCALC, TRIG, CHOLHDL, LDLDIRECT in the last 72 hours. Thyroid Function Tests: No results for input(s): TSH, T4TOTAL, FREET4, T3FREE, THYROIDAB in the last 72 hours. Anemia Panel: No results for input(s): VITAMINB12, FOLATE, FERRITIN, TIBC, IRON, RETICCTPCT in the last 72 hours. Urine analysis:    Component Value Date/Time   COLORURINE YELLOW 12/01/2016 1224   APPEARANCEUR CLEAR 12/01/2016 1224   LABSPEC 1.009 12/01/2016 1224   PHURINE 6.0 12/01/2016 1224   GLUCOSEU NEGATIVE 12/01/2016 1224   HGBUR SMALL (A)  12/01/2016 [place] 12/01/2016 1224   Edith [place] 12/01/2016 1224   PROTEINUR NEGATIVE 12/01/2016 1224   UROBILINOGEN 0.2 03/06/2013 2035   NITRITE NEGATIVE 12/01/2016 1224   LEUKOCYTESUR LARGE (A) 12/01/2016 1224   Sepsis Labs: Invalid input(s): PROCALCITONIN, LACTICIDVEN  Recent Results (from the past 240 hour(s))  Blood culture (routine x 2)     Status: None   Collection Time: 06/21/18 12:49 AM  Result Value Ref Range Status   Specimen Description BLOOD LEFT WRIST  Final   Special Requests   Final    BOTTLES DRAWN AEROBIC AND ANAEROBIC Blood Culture results may not be optimal due to an excessive volume of blood received in culture bottles   Culture   Final    NO GROWTH 5 DAYS Performed at [facility] [address], [place], [place] [address]    Report Status 06/26/2018 FINAL  Final  Blood culture (routine x 2)     Status: None   Collection Time: 06/21/18 12:57 AM  Result Value Ref Range Status    Specimen Description BLOOD RIGHT WRIST  Final   Special Requests   Final    BOTTLES DRAWN AEROBIC AND ANAEROBIC Blood Culture results may not be optimal due to an excessive volume of blood received in culture bottles   Culture   Final    NO GROWTH 5 DAYS Performed at [facility], [place] [address], [place], [place] [address]    Report Status 06/26/2018 FINAL  Final    Radiology Studies: No results found. Marzetta Board, MD, PhD [facility] Pager [phone]  If 7PM-7AM, please contact night-coverage [email or web address] Password [email or web address] 06/29/2018, 1:18 PM

## 2018-06-30 LAB — BASIC METABOLIC PANEL
Anion gap: 11 (ref 5–15)
BUN: 70 mg/dL — AB (ref 8–23)
CO2: 34 mmol/L — AB (ref 22–32)
Calcium: 9.9 mg/dL (ref 8.9–10.3)
Chloride: 93 mmol/L — ABNORMAL LOW (ref 98–111)
Creatinine, Ser: 2.37 mg/dL — ABNORMAL HIGH (ref 0.44–1.00)
GFR calc Af Amer: 22 mL/min — ABNORMAL LOW (ref >60)
GFR calc non Af Amer: 19 mL/min — ABNORMAL LOW (ref >60)
GLUCOSE: 125 mg/dL — AB (ref 70–99)
POTASSIUM: 4 mmol/L (ref 3.5–5.1)
Sodium: 138 mmol/L (ref 135–145)

## 2018-06-30 LAB — CBC
HEMATOCRIT: 34.4 % — AB (ref 36.0–46.0)
HEMOGLOBIN: 11 g/dL — AB (ref 12.0–15.0)
MCH: 31.4 pg (ref 26.0–34.0)
MCHC: 32 g/dL (ref 30.0–36.0)
MCV: 98.3 fL (ref 78.0–100.0)
PLATELETS: 111 10*3/uL — AB (ref 150–400)
RBC: 3.5 MIL/uL — ABNORMAL LOW (ref 3.87–5.11)
RDW: 14.8 % (ref 11.5–15.5)
WBC: 5.1 10*3/uL (ref 4.0–10.5)

## 2018-06-30 MED ORDER — METOLAZONE 2.5 MG PO TABS
2.5000 mg | ORAL_TABLET | Freq: Every day | ORAL | Status: DC
  Administered 2018-06-30: 2.5 mg via ORAL
  Filled 2018-06-30 (×2): qty 1

## 2018-06-30 NOTE — Progress Notes (Signed)
Advanced Heart Failure Rounding Note  PCP-Cardiologist: [name] Carnes, MD   Subjective:    Remains on IV lasix. Weight stable overnight - down 14 pounds total. Breathing better at rest but SOB with any ambulation.  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   Objective:   Weight Range: 112.4 kg Body mass index is 40 kg/m.   Vital Signs:   Temp:  [97.9 F (36.6 C)-98 F (36.7 C)] 98 F (36.7 C) (10/06 0750) Pulse Rate:  [90-100] 97 (10/06 0750) Resp:  [16-18] 16 (10/06 0750) BP: (110-132)/(62-89) 132/83 (10/06 0750) SpO2:  [96 %-100 %] 96 % (10/06 0750) Weight:  [112.4 kg] 112.4 kg (10/06 0300) Last BM Date: 06/27/18  Weight change: Filed Weights   06/28/18 0500 06/29/18 0500 06/30/18 0300  Weight: 113.9 kg 112.3 kg 112.4 kg    Intake/Output:   Intake/Output Summary (Last 24 hours) at 06/30/2018 1325 Last data filed at 06/30/2018 0500 Gross per 24 hour  Intake 1008 ml  Output 1650 ml  Net -642 ml      Physical Exam    General:  Obese woman sitting up in bed. No resp difficulty HEENT: normal Neck: supple. JVP 10 with prominent CV waves  Carotids 2+ bilat; no bruits. No lymphadenopathy or thryomegaly appreciated. Cor: PMI nondisplaced. Regular rate & rhythm. 2/6 TR. MR Lungs: clear Abdomen: obese soft, nontender, nondistended. No hepatosplenomegaly. No bruits or masses. Good bowel sounds. Extremities: no cyanosis, clubbing, rash, tr edema Neuro: alert & orientedx3, cranial nerves grossly intact. moves all 4 extremities w/o difficulty. Affect pleasant  Telemetry   V Paced 90s (personally reviewed)  EKG    No new tracings.   Labs    CBC Recent Labs   06/30/18 0840  WBC 5.1  HGB 11.0*  HCT 34.4*  MCV 98.3  PLT 267*   Basic Metabolic Panel Recent Labs    06/29/18 0212 06/30/18 0840  NA 140 138  K 4.2 4.0  CL 96* 93*  CO2 33* 34*  GLUCOSE 83 125*  BUN 72* 70*  CREATININE 2.48* 2.37*  CALCIUM 9.5 9.9   Liver Function Tests No results for input(s): AST, ALT, ALKPHOS, BILITOT, PROT, ALBUMIN in the last 72 hours. No results for input(s): LIPASE, AMYLASE in the last 72 hours. Cardiac Enzymes No results for input(s): CKTOTAL, CKMB, CKMBINDEX, TROPONINI in the last 72 hours.  BNP: BNP (last 3 results) Recent Labs    06/20/18 2249  BNP 160.6*    ProBNP (last 3 results) No results for input(s): PROBNP in the last 8760 hours.   D-Dimer No results for input(s): DDIMER in the last 72 hours. Hemoglobin A1C No results for input(s): HGBA1C in the last 72 hours. Fasting Lipid Panel No results for input(s): CHOL, HDL, LDLCALC, TRIG, CHOLHDL, LDLDIRECT in the last 72 hours. Thyroid Function Tests No results for input(s): TSH, T4TOTAL, T3FREE, THYROIDAB in the last 72 hours.  Invalid input(s): FREET3  Other results:   Imaging    No results found.   Medications:     Scheduled Medications: . allopurinol  100 mg Oral BID  . aspirin  325 mg Oral QPM  . calcitRIOL  0.25  mcg Oral QODAY  . calcium carbonate  1,250 mg Oral Q lunch  . enoxaparin (LOVENOX) injection  30 mg Subcutaneous Q24H  . ferrous sulfate  325 mg Oral QPM  . HYDROcodone-acetaminophen  1 tablet Oral TID  . isosorbide mononitrate  15 mg Oral Daily  . lactose free nutrition  237 mL Oral TID WC  . multivitamin with minerals  1 tablet Oral Daily  . neomycin-bacitracin-polymyxin   Topical Daily  . nystatin   Topical Daily  . polyethylene glycol  17 g Oral Daily  . potassium chloride  20 mEq Oral BID  . senna-docusate  1 tablet Oral QHS  . sodium chloride flush  3 mL Intravenous Q12H  . sodium chloride flush  3 mL Intravenous Q12H  . vitamin B-12   500 mcg Oral Daily    Infusions: . sodium chloride    . sodium chloride    . furosemide 120 mg (06/30/18 1013)    PRN Medications: sodium chloride, sodium chloride, acetaminophen, Glycerin (Adult), hydrALAZINE, HYDROcodone-acetaminophen, hydrOXYzine, nitroGLYCERIN, ondansetron (ZOFRAN) IV, sodium chloride flush, sodium chloride flush, zolpidem    Patient Profile   [name] [name] is a 72 y.o. female with a history of morbid obesity, afib s/p AV ablation and St Jude PPM, chronic combined HF, CKD IV, MR, TR, pulmonary HTN, OSA on BiPAP, GERD, gout, iron deficiency anemia.  Admitted 06/20/18 with CP and SOB.   Assessment/Plan   1. Acute on chronic combined HF: Echo 06/21/18 with EF 40-45%, LV severely dilated, mild LVH, akinesis of mid-apicalanteroseptal and apical myocardium, mild AS, severe MR, LA and RA severely dilated, moderate TR, PA peak pressure 42 mmHg.  Cause of cardiomyopathy uncertain.  Could be valvular, could be due to chronic RV pacing (has not had coronary angiography that I can see and now with CKD stage IV).  [name] 10/4 showed preserved cardiac output with markedly elevated right and left heart filling pressures (very prominent RV failure).  TEE looked like restrictive cardiomyopathy.  She is diuresing well with stable creatinine.  Severe TR will make volume estimation difficult.  - Continue Lasix 120 mg IV bid today will give one dose metolazone 2.5 - Off hydralazine with soft BP, Imdur continues.  - No spiro/ARB/ACEI/ARNI with CKD stage IV.  - No BB with volume overload. - It is possible that correction of mitral regurgitation could help her overall CHF picture and lessen the degree of RV failure that she has.   2. Mitral regurgitation: Severe MR on echo 06/21/18 (mild on echo 10/2016).  TEE on 10/4 showed moderate-severe central MR with some restriction of posterior leaflet.  Tricuspid regurgitation was more impressive.  Will review with structural heart team regarding  benefit from Mitraclip.  It is possible that decreasing the MR may help mitigate her right heart failure.  - she has outpatient f/u with structural heart team on 10/16 3. Afib s/p AV nodal ablation and PPM: Chronic RV pacing in the 90s.   - With dyssynchrony and decreased LV EF, could consider CRT upgrade at some point but doubt that will improve current situation - She refuses anticoagulation ("caused my teeth to fall out and my hair to fall out").  She will only take ASA.  4. OSA: Wears BiPAP qHS 5. CKD Stage 4: Baseline creatinine around 2.5.  She is stable so far with diuresis.  Follow closely.  6. Tricuspid regurgitation: Severe on TEE with prominent CV waves on exam.  Question here will be whether improving MR and lowering  PCWP/PA pressures will help with TR and right-sided failure.   Length of Stay: [place], MD  06/30/2018, 1:25 PM  Advanced Heart Failure Team Pager [phone] (M-F; 7a - 4p)  Please contact [facility] for night-coverage after hours (4p -7a ) and weekends on [email or web address]

## 2018-06-30 NOTE — Progress Notes (Signed)
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  Due to her chronic kidney disease and renal failure nephrology has been consulted as well.  Subjective: -Complains of shortness of breath with minimal activity, but she is okay at rest.  Wearing nasal cannula this morning.  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   CKD (chronic kidney disease), stage IV (HCC)   Chest pain   Syncope   Iron deficiency   Acute on chronic combined systolic and diastolic CHF /severe mitral regurgitation/severe TR -Appreciate cardiology consultation, heart failure team following, right heart cath with increased pressures and she was placed high-dose Lasix, renal function remains stable today with good urine output -Structural cardiology team to weigh in regarding she would benefit from mitral clip and whether that will help the right side  Chronic kidney disease stage IV -Creatinine remains stable, appreciate nephrology follow-up  OSA -Continue BiPAP, compliant in the hospital  Left great toe injury -Continue topical ointment  Concern for pneumonia -Family had concerns for pneumonia based on the chest x-ray on admission, however she has no leukocytosis, afebrile, no productive cough, now monitor off  antibiotics -Remains afebrile and without issues  History of AV node ablation for A. fib status post pacemaker -Stable, plans per cardiology, patient refuses anticoagulation  Liver cirrhosis -Platelets 111 this morning, stable  Scheduled Meds: . allopurinol  100 mg Oral BID  . aspirin  325 mg Oral QPM  . calcitRIOL  0.25 mcg Oral QODAY  . calcium carbonate  1,250 mg Oral Q lunch  . enoxaparin (LOVENOX) injection  30 mg Subcutaneous Q24H  . ferrous sulfate  325 mg Oral QPM  . HYDROcodone-acetaminophen  1 tablet Oral TID  . isosorbide mononitrate  15 mg Oral Daily  . lactose free nutrition  237 mL Oral TID WC  . multivitamin with minerals  1 tablet Oral Daily  . neomycin-bacitracin-polymyxin   Topical Daily  . nystatin   Topical Daily  . polyethylene glycol  17 g Oral Daily  . potassium chloride  20 mEq Oral BID  . senna-docusate  1 tablet Oral QHS  . sodium chloride flush  3 mL Intravenous Q12H  . sodium chloride flush  3 mL Intravenous Q12H  . vitamin B-12  500 mcg Oral Daily   Continuous Infusions: . sodium chloride    . sodium chloride    . furosemide 120 mg (06/30/18 1013)   PRN Meds:.sodium chloride, sodium chloride, acetaminophen, Glycerin (Adult), hydrALAZINE, HYDROcodone-acetaminophen, hydrOXYzine, nitroGLYCERIN, ondansetron (ZOFRAN) IV, sodium chloride flush, sodium chloride flush, zolpidem  DVT prophylaxis: heparin Code Status: DNR Family Communication: no family at bedside Disposition Plan: home when cleared by cardiology   Consultants:   Cardiology  Nephrology   Procedures:   2D echo:  Impressions: - Akinesis of the distal septum and apex; overall mild to moderate LV dysfunction;  severe LVE; mild AS (mean gradient 14 mmHg); severe MR; severe biatrial enlargement; moderate TR with mild pulmonary hypertension.  Antimicrobials:  None    Objective: Vitals:   06/29/18 1700 06/29/18 2100 06/30/18 0300 06/30/18 0750  BP: 110/62 129/89  132/83  Pulse:  90 100  97  Resp:  18  16  Temp: 97.9 F (36.6 C) 97.9 F (36.6 C)  98 F (36.7 C)  TempSrc: Oral Oral  Oral  SpO2: 99% 100%  96%  Weight:   112.4 kg   Height:        Intake/Output Summary (Last 24 hours) at 06/30/2018 1140 Last data filed at 06/30/2018 0500 Gross per 24 hour  Intake 1008 ml  Output 1650 ml  Net -642 ml   Filed Weights   06/28/18 0500 06/29/18 0500 06/30/18 0300  Weight: 113.9 kg 112.3 kg 112.4 kg    Examination: Constitutional: NAD Respiratory: CTA Cardiovascular: RRR, 3/6 SEM, trace lower extremity edema, + JVD    Data Reviewed: I have independently reviewed following labs and imaging studies   CBC: Recent Labs  Lab 06/24/18 0331 06/25/18 0257 06/26/18 0339 06/27/18 0353 06/30/18 0840  WBC 4.7 4.0 4.4 4.5 5.1  NEUTROABS  --   --  2.8  --   --   HGB 11.3* 10.1* 10.3* 10.2* 11.0*  HCT 35.1* 32.0* 33.1* 32.2* 34.4*  MCV 99.2 99.4 100.3* 100.0 98.3  PLT 112* 87* 105* 106* 675*   Basic Metabolic Panel: Recent Labs  Lab 06/26/18 0339 06/27/18 0353 06/28/18 0534 06/29/18 0212 06/30/18 0840  NA 141 141 140 140 138  K 4.3 4.1 4.4 4.2 4.0  CL 99 99 100 96* 93*  CO2 35* 34* 32 33* 34*  GLUCOSE 92 89 90 83 125*  BUN 73* 71* 73* 72* 70*  CREATININE 2.57* 2.46* 2.46* 2.48* 2.37*  CALCIUM 9.3 9.4 9.5 9.5 9.9  PHOS 3.5  --   --   --   --    GFR: Estimated Creatinine Clearance: 26.9 mL/min (A) (by C-G formula based on SCr of 2.37 mg/dL (H)). Liver Function Tests: Recent Labs  Lab 06/26/18 0339  ALBUMIN 3.7   No results for input(s): LIPASE, AMYLASE in the last 168 hours. No results for input(s): AMMONIA in the last 168 hours. Coagulation Profile: No results for input(s): INR, PROTIME in the last 168 hours. Cardiac Enzymes: No results for input(s): CKTOTAL, CKMB, CKMBINDEX, TROPONINI in the last 168 hours. BNP (last 3 results) No results for input(s): PROBNP in the last 8760 hours. HbA1C: No results for input(s): HGBA1C in the last 72  hours. CBG: No results for input(s): GLUCAP in the last 168 hours. Lipid Profile: No results for input(s): CHOL, HDL, LDLCALC, TRIG, CHOLHDL, LDLDIRECT in the last 72 hours. Thyroid Function Tests: No results for input(s): TSH, T4TOTAL, FREET4, T3FREE, THYROIDAB in the last 72 hours. Anemia Panel: No results for input(s): VITAMINB12, FOLATE, FERRITIN, TIBC, IRON, RETICCTPCT in the last 72 hours. Urine analysis:    Component Value Date/Time   COLORURINE YELLOW 12/01/2016 1224   APPEARANCEUR CLEAR 12/01/2016 1224   LABSPEC 1.009 12/01/2016 1224   PHURINE 6.0 12/01/2016 1224   GLUCOSEU NEGATIVE 12/01/2016 1224   HGBUR SMALL (A) 12/01/2016 1224   BILIRUBINUR NEGATIVE 12/01/2016 1224   KETONESUR NEGATIVE 12/01/2016 1224   PROTEINUR NEGATIVE 12/01/2016 1224   UROBILINOGEN 0.2 03/06/2013 2035   NITRITE NEGATIVE 12/01/2016 1224   LEUKOCYTESUR LARGE (A) 12/01/2016 1224   Sepsis Labs: Invalid input(s): PROCALCITONIN, LACTICIDVEN  Recent Results (from the past 240 hour(s))  Blood culture (routine x 2)     Status: None   Collection Time: 06/21/18 12:49 AM  Result Value Ref Range Status   Specimen Description BLOOD LEFT WRIST  Final   Special Requests   Final    BOTTLES DRAWN AEROBIC AND ANAEROBIC Blood Culture results may not be optimal due to an excessive volume of blood received in culture bottles   Culture   Final    NO GROWTH 5 DAYS Performed at [facility], [place] [address] [place], [place] [address]    Report Status 06/26/2018 FINAL  Final  Blood culture (routine x 2)     Status: None   Collection Time: 06/21/18 12:57 AM  Result Value Ref Range Status   Specimen Description BLOOD RIGHT WRIST  Final   Special Requests   Final    BOTTLES DRAWN AEROBIC AND ANAEROBIC Blood Culture results may not be optimal due to an excessive volume of blood received in culture bottles   Culture   Final    NO GROWTH 5 DAYS Performed at [facility], [place] [address] [place],  [place] [address]    Report Status 06/26/2018 FINAL  Final    Radiology Studies: No results found. Marzetta Board, MD, PhD [facility] Pager [phone]  If 7PM-7AM, please contact night-coverage [email or web address] Password [email or web address] 06/30/2018, 11:40 AM

## 2018-06-30 NOTE — Progress Notes (Signed)
Patients home CPAP at bedside and ready for use. Patient states she can place on herself and doesn't need assistance. Made patient aware if she changes her mind to let RN let RT know.

## 2018-06-30 NOTE — Progress Notes (Signed)
[facility] ROUNDING NOTE   Subjective:    I/Os yeserday 1.3 / 1.7 net neg ~446m Wt down ~6kg over admission   Objective:  Vital signs in last 24 hours:  Temp:  [97.9 F (36.6 C)] 97.9 F (36.6 C) (10/05 2100) Pulse Rate:  [90-100] 100 (10/05 2100) Resp:  [18] 18 (10/05 2100) BP: (110-129)/(62-89) 129/89 (10/05 2100) SpO2:  [99 %-100 %] 100 % (10/05 2100) Weight:  [112.4 kg] 112.4 kg (10/06 0300)  Weight change: 0.1 kg Filed Weights   06/28/18 0500 06/29/18 0500 06/30/18 0300  Weight: 113.9 kg 112.3 kg 112.4 kg    Intake/Output: I/O last 3 completed shifts: In: 1798.3 [P.O.:720; I.V.:63; IV Piggyback:1015.3] Out: 3450 [Urine:3450]   Intake/Output this shift:  No intake/output data recorded. Gen - sleeping comfortably, awakens easily Lungs - normal WOB today, rales improved ABD- BS present soft non-distended EXT-Trace to 1+ edema BL with ruddy scaling discoloration L > R which she says is chronic - slightly improved c/w yesterday   Basic Metabolic Panel: Recent Labs  Lab 06/25/18 0257 06/26/18 0339 06/27/18 0353 06/28/18 0534 06/29/18 0212  NA 140 141 141 140 140  K 4.0 4.3 4.1 4.4 4.2  CL 96* 99 99 100 96*  CO2 34* 35* 34* 32 33*  GLUCOSE 92 92 89 90 83  BUN 72* 73* 71* 73* 72*  CREATININE 2.36* 2.57* 2.46* 2.46* 2.48*  CALCIUM 9.5 9.3 9.4 9.5 9.5  PHOS  --  3.5  --   --   --     Liver Function Tests: Recent Labs  Lab 06/26/18 0339  ALBUMIN 3.7   No results for input(s): LIPASE, AMYLASE in the last 168 hours. No results for input(s): AMMONIA in the last 168 hours.  CBC: Recent Labs  Lab 06/24/18 0331 06/25/18 0257 06/26/18 0339 06/27/18 0353  WBC 4.7 4.0 4.4 4.5  NEUTROABS  --   --  2.8  --   HGB 11.3* 10.1* 10.3* 10.2*  HCT 35.1* 32.0* 33.1* 32.2*  MCV 99.2 99.4 100.3* 100.0  PLT 112* 87* 105* 106*    Cardiac Enzymes: No results for input(s): CKTOTAL, CKMB, CKMBINDEX, TROPONINI in the last 168 hours.  BNP: Invalid  input(s): POCBNP  CBG: No results for input(s): GLUCAP in the last 168 hours.  Microbiology: Results for orders placed or performed during the hospital encounter of 06/20/18  Blood culture (routine x 2)     Status: None   Collection Time: 06/21/18 12:49 AM  Result Value Ref Range Status   Specimen Description BLOOD LEFT WRIST  Final   Special Requests   Final    BOTTLES DRAWN AEROBIC AND ANAEROBIC Blood Culture results may not be optimal due to an excessive volume of blood received in culture bottles   Culture   Final    NO GROWTH 5 DAYS Performed at [facility] [address] [place] [address]   Report Status 06/26/2018 FINAL  Final  Blood culture (routine x 2)     Status: None   Collection Time: 06/21/18 12:57 AM  Result Value Ref Range Status   Specimen Description BLOOD RIGHT WRIST  Final   Special Requests   Final    BOTTLES DRAWN AEROBIC AND ANAEROBIC Blood Culture results may not be optimal due to an excessive volume of blood received in culture bottles   Culture   Final    NO GROWTH 5 DAYS Performed at [facility] [address] [address]    Report Status 06/26/2018 FINAL  Final    Coagulation Studies: No results for input(s): LABPROT, INR in the last 72 hours.  Urinalysis: No results for input(s): COLORURINE, LABSPEC, PHURINE, GLUCOSEU, HGBUR, BILIRUBINUR, KETONESUR, PROTEINUR, UROBILINOGEN, NITRITE, LEUKOCYTESUR in the last 72 hours.  Invalid input(s): APPERANCEUR    Imaging: No results found.   Medications:   . sodium chloride    . sodium chloride    . furosemide 120 mg (06/29/18 1839)   . allopurinol  100 mg Oral BID  . aspirin  325 mg Oral QPM  . calcitRIOL  0.25 mcg Oral QODAY  . calcium carbonate  1,250 mg Oral Q lunch  . enoxaparin (LOVENOX) injection  30 mg Subcutaneous Q24H  . ferrous sulfate  325 mg Oral QPM  . HYDROcodone-acetaminophen  1 tablet Oral TID  . isosorbide mononitrate  15 mg Oral  Daily  . lactose free nutrition  237 mL Oral TID WC  . multivitamin with minerals  1 tablet Oral Daily  . neomycin-bacitracin-polymyxin   Topical Daily  . nystatin   Topical Daily  . polyethylene glycol  17 g Oral Daily  . potassium chloride  20 mEq Oral BID  . senna-docusate  1 tablet Oral QHS  . sodium chloride flush  3 mL Intravenous Q12H  . sodium chloride flush  3 mL Intravenous Q12H  . vitamin B-12  500 mcg Oral Daily   sodium chloride, sodium chloride, acetaminophen, Glycerin (Adult), hydrALAZINE, HYDROcodone-acetaminophen, hydrOXYzine, nitroGLYCERIN, ondansetron (ZOFRAN) IV, sodium chloride flush, sodium chloride flush, zolpidem  Assessment/ Plan:   Chronic renal insufficiency stage IV baseline creatinine 2.5-3.0 with currently stable renal function even in face of diuresis yesterday which continues today.  Would plan to switch back to home oral diuretics tomorrow.  CTM.    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  Diuresing - IV per above, then po tomorrow.  Cardiology considering mitral clip.    Dyspnea:   CXR 10/2 with small R effusion o/w improved c/w admission.  Dyspnea improved in past few days with diuresis  Hypertension: off hydralazine currently with BPs lower; on imdur 15  Anemia Hb stable no ESA at this time continues oral ferrous sulfate  BMM Calcitrol 0.25 mcg daily she uses calcium carbonate once daily  History of atrial fibrillation - she has refused anticoagulation  Obstructive sleep apnea continues on BiPAP with home machine   LOS: 8 Jannifer Hick A @TODAY @7 :49 AM

## 2018-07-01 DIAGNOSIS — R079 Chest pain, unspecified: Secondary | ICD-10-CM

## 2018-07-01 LAB — BASIC METABOLIC PANEL
ANION GAP: 11 (ref 5–15)
BUN: 75 mg/dL — ABNORMAL HIGH (ref 8–23)
CHLORIDE: 92 mmol/L — AB (ref 98–111)
CO2: 35 mmol/L — AB (ref 22–32)
Calcium: 9.7 mg/dL (ref 8.9–10.3)
Creatinine, Ser: 2.4 mg/dL — ABNORMAL HIGH (ref 0.44–1.00)
GFR calc non Af Amer: 19 mL/min — ABNORMAL LOW (ref >60)
GFR, EST AFRICAN AMERICAN: 22 mL/min — AB (ref >60)
Glucose, Bld: 91 mg/dL (ref 70–99)
POTASSIUM: 3.8 mmol/L (ref 3.5–5.1)
Sodium: 138 mmol/L (ref 135–145)

## 2018-07-01 MED ORDER — METOLAZONE 2.5 MG PO TABS
2.5000 mg | ORAL_TABLET | Freq: Once | ORAL | Status: AC
  Administered 2018-07-01: 2.5 mg via ORAL

## 2018-07-01 MED ORDER — ACETAZOLAMIDE ER 500 MG PO CP12
500.0000 mg | ORAL_CAPSULE | Freq: Two times a day (BID) | ORAL | Status: DC
  Administered 2018-07-01 – 2018-07-02 (×3): 500 mg via ORAL
  Filled 2018-07-01 (×3): qty 1

## 2018-07-01 NOTE — Progress Notes (Signed)
[facility] ROUNDING NOTE   Subjective:     1.1L UOP yesterday on furosemide, acetazolamide,   Down 16lb from admit  Stable SCr, K.   AHF cont IV diuretics again today  Pt w/o c/o  Sees D[facility] at [facility]   Objective:  Vital signs in last 24 hours:  Temp:  [98 F (36.7 C)-98.1 F (36.7 C)] 98.1 F (36.7 C) (10/07 0754) Pulse Rate:  [91-93] 93 (10/07 0754) Resp:  [20] 20 (10/06 2240) BP: (101-127)/(60-78) 101/60 (10/07 0754) SpO2:  [95 %-97 %] 95 % (10/07 0754) Weight:  [111.3 kg] 111.3 kg (10/07 0500)  Weight change: -1.087 kg Filed Weights   06/29/18 0500 06/30/18 0300 07/01/18 0500  Weight: 112.3 kg 112.4 kg 111.3 kg    Intake/Output: I/O last 3 completed shifts: In: 818 [P.O.:240; I.V.:6; IV Piggyback:641] Out: 2800 [Urine:2800]   Intake/Output this shift:  Total I/O In: 3 [I.V.:3] Out: -  Gen - resting comfortably Lungs - normal WOB today, rales improved ABD- BS present soft non-distended EXT-Trace to 1+ edema BL with ruddy scaling discoloration L > R which she says is chronic - slightly improved c/w yesterday   Basic Metabolic Panel: Recent Labs  Lab 06/26/18 0339 06/27/18 0353 06/28/18 0534 06/29/18 0212 06/30/18 0840 07/01/18 0232  NA 141 141 140 140 138 138  K 4.3 4.1 4.4 4.2 4.0 3.8  CL 99 99 100 96* 93* 92*  CO2 35* 34* 32 33* 34* 35*  GLUCOSE 92 89 90 83 125* 91  BUN 73* 71* 73* 72* 70* 75*  CREATININE 2.57* 2.46* 2.46* 2.48* 2.37* 2.40*  CALCIUM 9.3 9.4 9.5 9.5 9.9 9.7  PHOS 3.5  --   --   --   --   --     Liver Function Tests: Recent Labs  Lab 06/26/18 0339  ALBUMIN 3.7   No results for input(s): LIPASE, AMYLASE in the last 168 hours. No results for input(s): AMMONIA in the last 168 hours.  CBC: Recent Labs  Lab 06/25/18 0257 06/26/18 0339 06/27/18 0353 06/30/18 0840  WBC 4.0 4.4 4.5 5.1  NEUTROABS  --  2.8  --   --   HGB 10.1* 10.3* 10.2* 11.0*  HCT 32.0* 33.1* 32.2* 34.4*  MCV 99.4 100.3* 100.0  98.3  PLT 87* 105* 106* 111*    Cardiac Enzymes: No results for input(s): CKTOTAL, CKMB, CKMBINDEX, TROPONINI in the last 168 hours.  BNP: Invalid input(s): POCBNP  CBG: No results for input(s): GLUCAP in the last 168 hours.  Microbiology: Results for orders placed or performed during the hospital encounter of 06/20/18  Blood culture (routine x 2)     Status: None   Collection Time: 06/21/18 12:49 AM  Result Value Ref Range Status   Specimen Description BLOOD LEFT WRIST  Final   Special Requests   Final    BOTTLES DRAWN AEROBIC AND ANAEROBIC Blood Culture results may not be optimal due to an excessive volume of blood received in culture bottles   Culture   Final    NO GROWTH 5 DAYS Performed at [facility], [place] [address] [place], [place] [address]    Report Status 06/26/2018 FINAL  Final  Blood culture (routine x 2)     Status: None   Collection Time: 06/21/18 12:57 AM  Result Value Ref Range Status   Specimen Description BLOOD RIGHT WRIST  Final   Special Requests   Final    BOTTLES DRAWN AEROBIC AND ANAEROBIC Blood Culture results  may not be optimal due to an excessive volume of blood received in culture bottles   Culture   Final    NO GROWTH 5 DAYS Performed at [facility], [place] [address], [place], [place] [address]    Report Status 06/26/2018 FINAL  Final    Coagulation Studies: No results for input(s): LABPROT, INR in the last 72 hours.  Urinalysis: No results for input(s): COLORURINE, LABSPEC, PHURINE, GLUCOSEU, HGBUR, BILIRUBINUR, KETONESUR, PROTEINUR, UROBILINOGEN, NITRITE, LEUKOCYTESUR in the last 72 hours.  Invalid input(s): APPERANCEUR    Imaging: No results found.   Medications:   . sodium chloride    . sodium chloride    . furosemide 120 mg (07/01/18 0925)   . acetaZOLAMIDE  500 mg Oral Q12H  . allopurinol  100 mg Oral BID  . aspirin  325 mg Oral QPM  . calcitRIOL  0.25 mcg Oral QODAY  . calcium carbonate  1,250 mg Oral Q  lunch  . enoxaparin (LOVENOX) injection  30 mg Subcutaneous Q24H  . ferrous sulfate  325 mg Oral QPM  . HYDROcodone-acetaminophen  1 tablet Oral TID  . isosorbide mononitrate  15 mg Oral Daily  . lactose free nutrition  237 mL Oral TID WC  . multivitamin with minerals  1 tablet Oral Daily  . neomycin-bacitracin-polymyxin   Topical Daily  . nystatin   Topical Daily  . polyethylene glycol  17 g Oral Daily  . potassium chloride  20 mEq Oral BID  . senna-docusate  1 tablet Oral QHS  . sodium chloride flush  3 mL Intravenous Q12H  . sodium chloride flush  3 mL Intravenous Q12H  . vitamin B-12  500 mcg Oral Daily   sodium chloride, sodium chloride, acetaminophen, Glycerin (Adult), hydrALAZINE, HYDROcodone-acetaminophen, hydrOXYzine, nitroGLYCERIN, ondansetron (ZOFRAN) IV, sodium chloride flush, sodium chloride flush, zolpidem  Assessment/ Plan:   Chronic renal insufficiency stage IV baseline creatinine 2.5-3.0 with currently stable renal function during IV diuresis.  Stable today.   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  Tentative transition to PO diuretics tomorrow, per AHF. Cardiology considering mitral clip.    Dyspnea:   CXR 10/2 with small R effusion o/w improved c/w admission.  Dyspnea improved in past few days with diuresis  Hypertension: off hydralazine currently with BPs lower; on imdur 15  Anemia Hb stable no ESA at this time continues oral ferrous sulfate  BMM Calcitrol 0.25 mcg daily she uses calcium carbonate once daily  History of atrial fibrillation - she has refused anticoagulation  Obstructive sleep apnea continues on BiPAP with home machine   LOS: 9 Judine Arciniega B @TODAY @11 :03 AM

## 2018-07-01 NOTE — Progress Notes (Addendum)
Advanced Heart Failure Rounding Note  PCP-Cardiologist: Dorris Carnes, MD   Subjective:    Yesterday diuresed with IV lasix + metolazone. Weight down another 2 pounds. Creatinine stable 2.4 .   SOB moving from the bed to the [facility] . She says dyspnea is much better than when she was admitted.   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   Objective:   Weight Range: 111.3 kg Body mass index is 39.61 kg/m.   Vital Signs:   Temp:  [98 F (36.7 C)] 98 F (36.7 C) (10/06 2240) Pulse Rate:  [91-97] 91 (10/06 2240) Resp:  [16-20] 20 (10/06 2240) BP: (127-132)/(78-83) 127/78 (10/06 2240) SpO2:  [96 %-97 %] 97 % (10/06 2240) Weight:  [111.3 kg] 111.3 kg (10/07 0500) Last BM Date: 06/30/18  Weight change: Filed Weights   06/29/18 0500 06/30/18 0300 07/01/18 0500  Weight: 112.3 kg 112.4 kg 111.3 kg    Intake/Output:   Intake/Output Summary (Last 24 hours) at 07/01/2018 0728 Last data filed at 07/01/2018 0500 Gross per 24 hour  Intake 3 ml  Output 1150 ml  Net -1147 ml      Physical Exam   General:  Dyspneic.  HEENT: normal Neck: supple. 8-9 JVP prominent VT waves  Carotids 2+ bilat; no bruits. No lymphadenopathy or thryomegaly appreciated. Cor: PMI nondisplaced. Regular rate & rhythm. No rubs, gallops. 2/6 TR . Lungs: clear Abdomen: obese, soft, nontender, nondistended. No hepatosplenomegaly. No bruits or masses. Good bowel sounds. Extremities: no cyanosis, clubbing, rash, edema. R and LLE hyperpigmentation.  Neuro: alert & orientedx3, cranial nerves grossly intact. moves all 4 extremities w/o difficulty. Affect pleasant    Telemetry   V paced 90-120s. Tachycardic with movement.   EKG    No  new tracings.   Labs    CBC Recent Labs    06/30/18 0840  WBC 5.1  HGB 11.0*  HCT 34.4*  MCV 98.3  PLT 568*   Basic Metabolic Panel Recent Labs    06/30/18 0840 07/01/18 0232  NA 138 138  K 4.0 3.8  CL 93* 92*  CO2 34* 35*  GLUCOSE 125* 91  BUN 70* 75*  CREATININE 2.37* 2.40*  CALCIUM 9.9 9.7   Liver Function Tests No results for input(s): AST, ALT, ALKPHOS, BILITOT, PROT, ALBUMIN in the last 72 hours. No results for input(s): LIPASE, AMYLASE in the last 72 hours. Cardiac Enzymes No results for input(s): CKTOTAL, CKMB, CKMBINDEX, TROPONINI in the last 72 hours.  BNP: BNP (last 3 results) Recent Labs    06/20/18 2249  BNP 160.6*    ProBNP (last 3 results) No results for input(s): PROBNP in the last 8760 hours.   D-Dimer No results for input(s): DDIMER in the last 72 hours. Hemoglobin A1C No results for input(s): HGBA1C in the last 72 hours. Fasting Lipid Panel No results for input(s): CHOL, HDL, LDLCALC, TRIG, CHOLHDL, LDLDIRECT in the last 72 hours. Thyroid Function Tests No results for input(s): TSH, T4TOTAL, T3FREE, THYROIDAB in the last 72 hours.  Invalid input(s): FREET3  Other results:   Imaging    No results found.   Medications:     Scheduled Medications: . allopurinol  100 mg Oral  BID  . aspirin  325 mg Oral QPM  . calcitRIOL  0.25 mcg Oral QODAY  . calcium carbonate  1,250 mg Oral Q lunch  . enoxaparin (LOVENOX) injection  30 mg Subcutaneous Q24H  . ferrous sulfate  325 mg Oral QPM  . HYDROcodone-acetaminophen  1 tablet Oral TID  . isosorbide mononitrate  15 mg Oral Daily  . lactose free nutrition  237 mL Oral TID WC  . metolazone  2.5 mg Oral Daily  . multivitamin with minerals  1 tablet Oral Daily  . neomycin-bacitracin-polymyxin   Topical Daily  . nystatin   Topical Daily  . polyethylene glycol  17 g Oral Daily  . potassium chloride  20 mEq Oral BID  . senna-docusate  1 tablet Oral QHS  . sodium chloride flush  3 mL  Intravenous Q12H  . sodium chloride flush  3 mL Intravenous Q12H  . vitamin B-12  500 mcg Oral Daily    Infusions: . sodium chloride    . sodium chloride    . furosemide 120 mg (06/30/18 1655)    PRN Medications: sodium chloride, sodium chloride, acetaminophen, Glycerin (Adult), hydrALAZINE, HYDROcodone-acetaminophen, hydrOXYzine, nitroGLYCERIN, ondansetron (ZOFRAN) IV, sodium chloride flush, sodium chloride flush, zolpidem    Patient Profile   [name] [name] is a 72 y.o. female with a history of morbid obesity, afib s/p AV ablation and St Jude PPM, chronic combined HF, CKD IV, MR, TR, pulmonary HTN, OSA on BiPAP, GERD, gout, iron deficiency anemia.  Admitted 06/20/18 with CP and SOB.   Assessment/Plan   1. Acute on chronic combined HF: Echo 06/21/18 with EF 40-45%, LV severely dilated, mild LVH, akinesis of mid-apicalanteroseptal and apical myocardium, mild AS, severe MR, LA and RA severely dilated, moderate TR, PA peak pressure 42 mmHg.  Cause of cardiomyopathy uncertain.  Could be valvular, could be due to chronic RV pacing (has not had coronary angiography that I can see and now with CKD stage IV).  [place] 10/4 showed preserved cardiac output with markedly elevated right and left heart filling pressures (very prominent RV failure).  TEE looked like restrictive cardiomyopathy.   -Remains dyspneic with minimal exertion.  Volume status improving. Continue IV lasix and give diamox 500 mg twice a day and see if this helps.  - Off hydralazine with soft BP, Imdur continues.  - No spiro/ARB/ACEI/ARNI with CKD stage IV.  - No BB with volume overload. - It is possible that correction of mitral regurgitation could help her overall CHF picture and lessen the degree of RV failure that she has.   2. Mitral regurgitation: Severe MR on echo 06/21/18 (mild on echo 10/2016).  TEE on 10/4 showed moderate-severe central MR with some restriction of posterior leaflet.  Tricuspid regurgitation was more  impressive.  Will review with structural heart team regarding benefit from Mitraclip.  It is possible that decreasing the MR may help mitigate her right heart failure.  - she has outpatient f/u with structural heart team on 10/16 3. Afib s/p AV nodal ablation and PPM: Chronic RV pacing in the 90s.   - With dyssynchrony and decreased LV EF, could consider CRT upgrade at some point but doubt that will improve current situation - She refuses anticoagulation ("caused my teeth to fall out and my hair to fall out").  She will only take ASA.  4. OSA: Wears BiPAP qHS 5. CKD Stage 4: Baseline creatinine around 2.5.  Todays creatinine 2.4, stable.   6. Tricuspid regurgitation: Severe on TEE with prominent  CV waves on exam.  Question here will be whether improving MR and lowering PCWP/PA pressures will help with TR and right-sided failure.   Length of Stay: [place], NP  07/01/2018, 7:28 AM  Advanced Heart Failure Team Pager [phone] (M-F; 7a - 4p)  Please contact [facility] for night-coverage after hours (4p -7a ) and weekends on [email or web address]  Patient seen and examined with the above-signed Advanced Practice Provider and/or Housestaff. I personally reviewed laboratory data, imaging studies and relevant notes. I independently examined the patient and formulated the important aspects of the plan. I have edited the note to reflect any of my changes or salient points. I have personally discussed the plan with the patient and/or family.  Continues to diurese well. Weight down 15 pounds. Suspect she is getting about as dry as we can get her but wil try one more day of IV lasix with diamox. And metolazone. Creatinine stable.   Glori Bickers, MD  9:03 AM

## 2018-07-01 NOTE — Progress Notes (Signed)
Patients home CPAP is ready at bedside. Patient did ask for smaller mask, however, the only smaller size available is a childs mask which will be too small. Did offer a nasal mask or under the nose mask and she declined trying both. Patient states she doesn't need any assistance. Let her know if she changes her mind to have the RN to contact RT.

## 2018-07-01 NOTE — Progress Notes (Signed)
PROGRESS NOTE  [name] [name]:[id number] DOB: Jun 11, 1946 DOA: 06/20/2018 PCP: [name], MD   LOS: 9 days   Brief Narrative / Interim history: 72 year old female with history of chronic systolic CHF with EF of 73%, atrial fibrillation, severe mitral regurgitation, chronic kidney disease stage IV, liver cirrhosis, who was admitted to the hospital with chief complaint of shortness of breath, she was found to be fluid overloaded and was placed on diuresis and cardiology was consulted.  Due to her chronic kidney disease and renal failure nephrology has been consulted as well.  Subjective: -no complaints at rest, does get shortwinded with minimal activity still. No chest pain, no palpitations   Assessment & Plan: Principal Problem:   Acute on chronic combined systolic and diastolic CHF (congestive heart failure) (HCC) Active Problems:   Essential hypertension   Permanent atrial fibrillation   PPM-St.Jude after AV node ablation   CKD (chronic kidney disease), stage IV (HCC)   Chest pain   Syncope   Iron deficiency   Acute on chronic combined systolic and diastolic CHF /severe mitral regurgitation/severe TR -Appreciate cardiology consultation, heart failure team following, right heart cath with increased pressures and she was placed high-dose Lasix, renal function remains stable today with good urine output. Continue Lasix, add metolazone  -Structural cardiology team to weigh in regarding she would benefit from mitral clip and whether that will help the right side  Chronic kidney disease stage IV -Creatinine remains stable, appreciate nephrology follow-up  OSA -Continue BiPAP, compliant in the hospital -wants a smaller mask tonight   Left great toe injury -Continue topical ointment  Concern for pneumonia -Family had concerns for pneumonia based on the chest x-ray on admission, however she has no leukocytosis, afebrile, no productive cough, now monitor off  antibiotics -Remains afebrile and without issues  History of AV node ablation for A. fib status post pacemaker -Stable, plans per cardiology, patient refuses anticoagulation  Liver cirrhosis -Platelets overall stable   Scheduled Meds: . acetaZOLAMIDE  500 mg Oral Q12H  . allopurinol  100 mg Oral BID  . aspirin  325 mg Oral QPM  . calcitRIOL  0.25 mcg Oral QODAY  . calcium carbonate  1,250 mg Oral Q lunch  . enoxaparin (LOVENOX) injection  30 mg Subcutaneous Q24H  . ferrous sulfate  325 mg Oral QPM  . HYDROcodone-acetaminophen  1 tablet Oral TID  . isosorbide mononitrate  15 mg Oral Daily  . lactose free nutrition  237 mL Oral TID WC  . multivitamin with minerals  1 tablet Oral Daily  . neomycin-bacitracin-polymyxin   Topical Daily  . nystatin   Topical Daily  . polyethylene glycol  17 g Oral Daily  . potassium chloride  20 mEq Oral BID  . senna-docusate  1 tablet Oral QHS  . sodium chloride flush  3 mL Intravenous Q12H  . sodium chloride flush  3 mL Intravenous Q12H  . vitamin B-12  500 mcg Oral Daily   Continuous Infusions: . sodium chloride    . sodium chloride    . furosemide 120 mg (07/01/18 0925)   PRN Meds:.sodium chloride, sodium chloride, acetaminophen, Glycerin (Adult), hydrALAZINE, HYDROcodone-acetaminophen, hydrOXYzine, nitroGLYCERIN, ondansetron (ZOFRAN) IV, sodium chloride flush, sodium chloride flush, zolpidem  DVT prophylaxis: heparin Code Status: DNR Family Communication: no family at bedside Disposition Plan: home when cleared by cardiology   Consultants:   Cardiology  Nephrology   Procedures:   2D echo:  Impressions: - Akinesis of the distal septum and apex; overall mild to  moderate LV dysfunction; severe LVE; mild AS (mean gradient 14 mmHg); severe MR; severe biatrial enlargement; moderate TR with mild pulmonary hypertension.  Antimicrobials:  None    Objective: Vitals:   06/30/18 0750 06/30/18 2240 07/01/18 0500 07/01/18 0754  BP:  132/83 127/78  101/60  Pulse: 97 91  93  Resp: 16 20    Temp: 98 F (36.7 C) 98 F (36.7 C)  98.1 F (36.7 C)  TempSrc: Oral Oral  Oral  SpO2: 96% 97%  95%  Weight:   111.3 kg   Height:        Intake/Output Summary (Last 24 hours) at 07/01/2018 0945 Last data filed at 07/01/2018 5009 Gross per 24 hour  Intake 6 ml  Output 1150 ml  Net -1144 ml   Filed Weights   06/29/18 0500 06/30/18 0300 07/01/18 0500  Weight: 112.3 kg 112.4 kg 111.3 kg    Examination: Constitutional: NAD Respiratory: CTA Cardiovascular: RRR, 3/6 SEM    Data Reviewed: I have independently reviewed following labs and imaging studies   CBC: Recent Labs  Lab 06/25/18 0257 06/26/18 0339 06/27/18 0353 06/30/18 0840  WBC 4.0 4.4 4.5 5.1  NEUTROABS  --  2.8  --   --   HGB 10.1* 10.3* 10.2* 11.0*  HCT 32.0* 33.1* 32.2* 34.4*  MCV 99.4 100.3* 100.0 98.3  PLT 87* 105* 106* 381*   Basic Metabolic Panel: Recent Labs  Lab 06/26/18 0339 06/27/18 0353 06/28/18 0534 06/29/18 0212 06/30/18 0840 07/01/18 0232  NA 141 141 140 140 138 138  K 4.3 4.1 4.4 4.2 4.0 3.8  CL 99 99 100 96* 93* 92*  CO2 35* 34* 32 33* 34* 35*  GLUCOSE 92 89 90 83 125* 91  BUN 73* 71* 73* 72* 70* 75*  CREATININE 2.57* 2.46* 2.46* 2.48* 2.37* 2.40*  CALCIUM 9.3 9.4 9.5 9.5 9.9 9.7  PHOS 3.5  --   --   --   --   --    GFR: Estimated Creatinine Clearance: 26.4 mL/min (A) (by C-G formula based on SCr of 2.4 mg/dL (H)). Liver Function Tests: Recent Labs  Lab 06/26/18 0339  ALBUMIN 3.7   No results for input(s): LIPASE, AMYLASE in the last 168 hours. No results for input(s): AMMONIA in the last 168 hours. Coagulation Profile: No results for input(s): INR, PROTIME in the last 168 hours. Cardiac Enzymes: No results for input(s): CKTOTAL, CKMB, CKMBINDEX, TROPONINI in the last 168 hours. BNP (last 3 results) No results for input(s): PROBNP in the last 8760 hours. HbA1C: No results for input(s): HGBA1C in the last 72  hours. CBG: No results for input(s): GLUCAP in the last 168 hours. Lipid Profile: No results for input(s): CHOL, HDL, LDLCALC, TRIG, CHOLHDL, LDLDIRECT in the last 72 hours. Thyroid Function Tests: No results for input(s): TSH, T4TOTAL, FREET4, T3FREE, THYROIDAB in the last 72 hours. Anemia Panel: No results for input(s): VITAMINB12, FOLATE, FERRITIN, TIBC, IRON, RETICCTPCT in the last 72 hours. Urine analysis:    Component Value Date/Time   COLORURINE YELLOW 12/01/2016 1224   APPEARANCEUR CLEAR 12/01/2016 1224   LABSPEC 1.009 12/01/2016 1224   PHURINE 6.0 12/01/2016 1224   GLUCOSEU NEGATIVE 12/01/2016 1224   HGBUR SMALL (A) 12/01/2016 1224   BILIRUBINUR NEGATIVE 12/01/2016 1224   KETONESUR NEGATIVE 12/01/2016 1224   PROTEINUR NEGATIVE 12/01/2016 1224   UROBILINOGEN 0.2 03/06/2013 2035   NITRITE NEGATIVE 12/01/2016 1224   LEUKOCYTESUR LARGE (A) 12/01/2016 1224   Sepsis Labs: Invalid input(s): PROCALCITONIN, LACTICIDVEN  No results found for this or any previous visit (from the past 240 hour(s)).  Radiology Studies: No results found. Marzetta Board, MD, PhD [facility] Pager [phone]  If 7PM-7AM, please contact night-coverage [email or web address] Password [email or web address] 07/01/2018, 9:45 AM

## 2018-07-02 LAB — BASIC METABOLIC PANEL
ANION GAP: 10 (ref 5–15)
BUN: 78 mg/dL — AB (ref 8–23)
CALCIUM: 9.7 mg/dL (ref 8.9–10.3)
CO2: 38 mmol/L — ABNORMAL HIGH (ref 22–32)
Chloride: 91 mmol/L — ABNORMAL LOW (ref 98–111)
Creatinine, Ser: 2.54 mg/dL — ABNORMAL HIGH (ref 0.44–1.00)
GFR calc Af Amer: 20 mL/min — ABNORMAL LOW (ref >60)
GFR, EST NON AFRICAN AMERICAN: 18 mL/min — AB (ref >60)
Glucose, Bld: 88 mg/dL (ref 70–99)
POTASSIUM: 3.8 mmol/L (ref 3.5–5.1)
SODIUM: 139 mmol/L (ref 135–145)

## 2018-07-02 MED ORDER — TORSEMIDE 20 MG PO TABS
80.0000 mg | ORAL_TABLET | Freq: Two times a day (BID) | ORAL | Status: DC
  Administered 2018-07-02: 80 mg via ORAL
  Filled 2018-07-02: qty 4

## 2018-07-02 MED ORDER — ACETAMINOPHEN 325 MG PO TABS: 650.0000 mg | ORAL_TABLET | Freq: Four times a day (QID) | ORAL | Status: DC | PRN

## 2018-07-02 MED ORDER — POTASSIUM CHLORIDE CRYS ER 20 MEQ PO TBCR: 20.0000 meq | EXTENDED_RELEASE_TABLET | Freq: Two times a day (BID) | ORAL | 0 refills | Status: DC

## 2018-07-02 MED FILL — Heparin Sod (Porcine)-NaCl IV Soln 1000 Unit/500ML-0.9%: INTRAVENOUS | Qty: 500 | Status: AC

## 2018-07-02 NOTE — Progress Notes (Addendum)
Advanced Heart Failure Rounding Note  PCP-Cardiologist: Dorris Carnes, MD   Subjective:    Yesterday diuresed with IV lasix, metolazone, and diamox. Creatinine trending up 2.4>2.5.  Wants to go home.  Remains SOB with exertion.    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   Objective:   Weight Range: 110.9 kg Body mass index is 39.48 kg/m.   Vital Signs:   Temp:  [97.8 F (36.6 C)-98.5 F (36.9 C)] 98.3 F (36.8 C) (10/08 0800) Pulse Rate:  [90-101] 101 (10/08 0800) Resp:  [18] 18 (10/08 0800) BP: (100-123)/(67-80) 123/80 (10/08 0800) SpO2:  [96 %] 96 % (10/08 0800) Weight:  [110.9 kg] 110.9 kg (10/08 0524) Last BM Date: 07/01/18  Weight change: Filed Weights   06/30/18 0300 07/01/18 0500 07/02/18 0524  Weight: 112.4 kg 111.3 kg 110.9 kg    Intake/Output:   Intake/Output Summary (Last 24 hours) at 07/02/2018 0841 Last data filed at 07/01/2018 1900 Gross per 24 hour  Intake 2193.07 ml  Output 1300 ml  Net 893.07 ml      Physical Exam   General:  No resp difficulty. Sitting on the side of the bed.  HEENT: normal Neck: supple. JVD 8-9 . Carotids 2+ bilat; no bruits. No lymphadenopathy or thryomegaly appreciated. Cor: PMI nondisplaced. Regular rate & rhythm. No rubs, gallops or murmurs. Lungs: clear on room air.  Abdomen: obese, soft, nontender, nondistended. No hepatosplenomegaly. No bruits or masses. Good bowel sounds. Extremities: no cyanosis, clubbing, rash, R and LLE hyperpigmentation.  Neuro: alert & orientedx3, cranial nerves grossly intact. moves all 4 extremities w/o difficulty. Affect pleasant   Telemetry   V paced  90-100s  EKG    No new tracings.   Labs     CBC Recent Labs    06/30/18 0840  WBC 5.1  HGB 11.0*  HCT 34.4*  MCV 98.3  PLT 638*   Basic Metabolic Panel Recent Labs    07/01/18 0232 07/02/18 0149  NA 138 139  K 3.8 3.8  CL 92* 91*  CO2 35* 38*  GLUCOSE 91 88  BUN 75* 78*  CREATININE 2.40* 2.54*  CALCIUM 9.7 9.7   Liver Function Tests No results for input(s): AST, ALT, ALKPHOS, BILITOT, PROT, ALBUMIN in the last 72 hours. No results for input(s): LIPASE, AMYLASE in the last 72 hours. Cardiac Enzymes No results for input(s): CKTOTAL, CKMB, CKMBINDEX, TROPONINI in the last 72 hours.  BNP: BNP (last 3 results) Recent Labs    06/20/18 2249  BNP 160.6*    ProBNP (last 3 results) No results for input(s): PROBNP in the last 8760 hours.   D-Dimer No results for input(s): DDIMER in the last 72 hours. Hemoglobin A1C No results for input(s): HGBA1C in the last 72 hours. Fasting Lipid Panel No results for input(s): CHOL, HDL, LDLCALC, TRIG, CHOLHDL, LDLDIRECT in the last 72 hours. Thyroid Function Tests No results for input(s): TSH, T4TOTAL, T3FREE, THYROIDAB in the last 72 hours.  Invalid input(s): FREET3  Other results:   Imaging    No results found.   Medications:     Scheduled Medications: . acetaZOLAMIDE  500 mg Oral Q12H  . allopurinol  100 mg Oral BID  .  aspirin  325 mg Oral QPM  . calcitRIOL  0.25 mcg Oral QODAY  . calcium carbonate  1,250 mg Oral Q lunch  . enoxaparin (LOVENOX) injection  30 mg Subcutaneous Q24H  . ferrous sulfate  325 mg Oral QPM  . HYDROcodone-acetaminophen  1 tablet Oral TID  . isosorbide mononitrate  15 mg Oral Daily  . lactose free nutrition  237 mL Oral TID WC  . multivitamin with minerals  1 tablet Oral Daily  . neomycin-bacitracin-polymyxin   Topical Daily  . nystatin   Topical Daily  . polyethylene glycol  17 g Oral Daily  . potassium chloride  20 mEq Oral BID  . senna-docusate  1 tablet Oral QHS  . sodium chloride flush  3 mL Intravenous Q12H  . sodium  chloride flush  3 mL Intravenous Q12H  . vitamin B-12  500 mcg Oral Daily    Infusions: . sodium chloride    . sodium chloride    . furosemide 120 mg (07/01/18 1709)    PRN Medications: sodium chloride, sodium chloride, acetaminophen, Glycerin (Adult), hydrALAZINE, HYDROcodone-acetaminophen, hydrOXYzine, nitroGLYCERIN, ondansetron (ZOFRAN) IV, sodium chloride flush, sodium chloride flush, zolpidem    Patient Profile   [name] [name] is a 72 y.o. female with a history of morbid obesity, afib s/p AV ablation and St Jude PPM, chronic combined HF, CKD IV, MR, TR, pulmonary HTN, OSA on BiPAP, GERD, gout, iron deficiency anemia.  Admitted 06/20/18 with CP and SOB.   Assessment/Plan   1. Acute on chronic combined HF: Echo 06/21/18 with EF 40-45%, LV severely dilated, mild LVH, akinesis of mid-apicalanteroseptal and apical myocardium, mild AS, severe MR, LA and RA severely dilated, moderate TR, PA peak pressure 42 mmHg.  Cause of cardiomyopathy uncertain.  Could be valvular, could be due to chronic RV pacing (has not had coronary angiography that I can see and now with CKD stage IV).  [place] 10/4 showed preserved cardiac output with markedly elevated right and left heart filling pressures (very prominent RV failure).  TEE looked like restrictive cardiomyopathy.   -Volume status improving. Stop IV lasix, metolazone. Start torsemide 80 mg twice a day.  - Off hydralazine with soft BP, Imdur continues.  - No spiro/ARB/ACEI/ARNI with CKD stage IV.  - No BB with volume overload. - It is possible that correction of mitral regurgitation could help her overall CHF picture and lessen the degree of RV failure that she has.   2. Mitral regurgitation: Severe MR on echo 06/21/18 (mild on echo 10/2016).  TEE on 10/4 showed moderate-severe central MR with some restriction of posterior leaflet.  Tricuspid regurgitation was more impressive.  Will review with structural heart team regarding benefit from Mitraclip.   It is possible that decreasing the MR may help mitigate her right heart failure.  - she has outpatient f/u with structural heart team on 10/16 3. Afib s/p AV nodal ablation and PPM: Chronic RV pacing in the 90s.   - With dyssynchrony and decreased LV EF, could consider CRT upgrade at some point but doubt that will improve current situation - She refuses anticoagulation ("caused my teeth to fall out and my hair to fall out").  She will only take ASA.  4. OSA: Wears BiPAP qHS 5. CKD Stage 4: Baseline creatinine around 2.5.  Creatinine up to 2.5   -Nephrology following.   6. Tricuspid regurgitation: Severe on TEE with prominent CV waves on exam.  Question here will be whether improving MR and lowering PCWP/PA pressures will help  with TR and right-sided failure.   Length of Stay: [place], NP  07/02/2018, 8:41 AM  Advanced Heart Failure Team Pager [phone] (M-F; 7a - 4p)  Please contact [facility] for night-coverage after hours (4p -7a ) and weekends on [email or web address]  Patient seen and examined with the above-signed Advanced Practice Provider and/or Housestaff. I personally reviewed laboratory data, imaging studies and relevant notes. I independently examined the patient and formulated the important aspects of the plan. I have edited the note to reflect any of my changes or salient points. I have personally discussed the plan with the patient and/or family.  Volume status looks much better. CVP about 8 now. Creatinine bumping slightly and this is about as dry as we can get her. Case reviewed with structural heart team and MV appears clippable but will need to get insurance approval. Will follow up with Structural Heart team on 10/16. Will also arrange HF f/u appointment. D/w Dr. Renne Crigler personally.   HF team will sign off:   HF meds on d/c:  Resume toresmide 80 bid  Metolazone 2.58m prn only for worsening volume overload  Hydralazine 12.5 tid (new) Imdur 137mdaily (continue from  previous home meds)   Needs BMET in 1 week.   DaGlori BickersMD  11:56 AM

## 2018-07-02 NOTE — Discharge Instructions (Signed)
Follow with Dr. Burt Knack as scheduled  CHF  Discharge instructions  DIET  Low sodium Heart Healthy Diet with fluid restriction 1500cc/day  ACTIVITY  Avoid strenuous activity  WEIGH DAILY AT SAME TIME . CALL YOUR DOCTOR IF WEIGHT INCREASES BY MORE THAN 3  POUNDS IN 2 DAYS  CALL YOUR DOCTOR OR COME TO EMERGENCY ROOM IF WORSENING SHORTNESS OF BREATH OR  CHEST PAIN OR SWELLING  F/U with PMD in 1 week to recheck labs and adjust fluid pills as needed  If you smoke cigarettes or use any tobacco products you are advised to stop. Please ask nurse for any written materials  Or additional information you want regarding smoking cessation   Please get a complete blood count and chemistry panel checked by your Primary MD at your next visit, and again as instructed by your Primary MD. Please get your medications reviewed and adjusted by your Primary MD.  Please request your Primary MD to go over all Hospital Tests and Procedure/Radiological results at the follow up, please get all Hospital records sent to your Prim MD by signing hospital release before you go home.  If you had Pneumonia of Lung problems at the Hospital: Please get a 2 view Chest X ray done in 6-8 weeks after hospital discharge or sooner if instructed by your Primary MD.  If you have Congestive Heart Failure: Please call your Cardiologist or Primary MD anytime you have any of the following symptoms:  1) 3 pound weight gain in 24 hours or 5 pounds in 1 week  2) shortness of breath, with or without a dry hacking cough  3) swelling in the hands, feet or stomach  4) if you have to sleep on extra pillows at night in order to breathe  Follow cardiac low salt diet and 1.5 lit/day fluid restriction.  If you have diabetes Accuchecks 4 times/day, Once in AM empty stomach and then before each meal. Log in all results and show them to your primary doctor at your next visit. If any glucose reading is under 80 or above 300 call your primary MD  immediately.  If you have Seizure/Convulsions/Epilepsy: Please do not drive, operate heavy machinery, participate in activities at heights or participate in high speed sports until you have seen by Primary MD or a Neurologist and advised to do so again.  If you had Gastrointestinal Bleeding: Please ask your Primary MD to check a complete blood count within one week of discharge or at your next visit. Your endoscopic/colonoscopic biopsies that are pending at the time of discharge, will also need to followed by your Primary MD.  Get Medicines reviewed and adjusted. Please take all your medications with you for your next visit with your Primary MD  Please request your Primary MD to go over all hospital tests and procedure/radiological results at the follow up, please ask your Primary MD to get all Hospital records sent to his/her office.  If you experience worsening of your admission symptoms, develop shortness of breath, life threatening emergency, suicidal or homicidal thoughts you must seek medical attention immediately by calling 911 or calling your MD immediately  if symptoms less severe.  You must read complete instructions/literature along with all the possible adverse reactions/side effects for all the Medicines you take and that have been prescribed to you. Take any new Medicines after you have completely understood and accpet all the possible adverse reactions/side effects.   Do not drive or operate heavy machinery when taking Pain medications.  Do not take more than prescribed Pain, Sleep and Anxiety Medications  Special Instructions: If you have smoked or chewed Tobacco  in the last 2 yrs please stop smoking, stop any regular Alcohol  and or any Recreational drug use.  Wear Seat belts while driving.  Please note You were cared for by a hospitalist during your hospital stay. If you have any questions about your discharge medications or the care you received while you were in the  hospital after you are discharged, you can call the unit and asked to speak with the hospitalist on call if the hospitalist that took care of you is not available. Once you are discharged, your primary care physician will handle any further medical issues. Please note that NO REFILLS for any discharge medications will be authorized once you are discharged, as it is imperative that you return to your primary care physician (or establish a relationship with a primary care physician if you do not have one) for your aftercare needs so that they can reassess your need for medications and monitor your lab values.  You can reach the hospitalist office at phone [phone] or fax [phone]   If you do not have a primary care physician, you can call [phone] for a physician referral.  Activity: As tolerated with Full fall precautions use walker/cane & assistance as needed  Diet: low salt, heart healthy  Disposition Home

## 2018-07-02 NOTE — Discharge Summary (Signed)
Physician Discharge Summary  [name] [name] FHQ:[id number] DOB: 04/06/1946 DOA: 06/20/2018  PCP: [name], MD  Admit date: 06/20/2018 Discharge date: 07/02/2018  Admitted From: home Disposition:  home  Recommendations for Outpatient Follow-up:  1. Follow up with PCP in 1-2 weeks 2. Follow-up with Dr. Burt Knack as arranged  Home Health: PT Equipment/Devices: none  Discharge Condition: stable CODE STATUS: DNR Diet recommendation: low salt  HPI: Per Dr. Blaine Hamper, [name] [name] is a 72 y.o. female with medical history significant of CHF with EF of 35%, hypertension, GERD, gout, iron deficiency anemia, atrial fibrillation not on anticoagulants, morbid obesity, pacemaker placement, OSA on BiPAP, CKD 4, who presents with shortness of breath and chest pain. Patient states that she has been having shortness of breath in the past 5 days, which has progressively getting worse.  She also has worsening bilateral leg edema.  She has gained more than 3 pounds recently. The shortness of breath is associated with intermittent chest pain, which is located in the substernal area, pressure-like, 7 out of 10 in severity sometimes, currently 3 out of 10 severity, radiating to the left shoulder.  The chest pain is not pleuritic, not aggravated by deep breath or coughing.  Patient has mild dry cough, but no fever or chills.  No recent long distant traveling. Per her daughter, pt she had syncopal episode while she was in her motorized wheelchair Sunday, lasted for a few seconds . She probably compressed her left shoulder on the handle of wheelchair, causing bruise in L shoulder/deltoid area.  Patient denies any tingling, numbness or weakness in extremities.  No facial droop, slurred speech.  No vision change or hearing loss.  She has nausea intermittently, but no vomiting, diarrhea or abdominal pain.  Denies symptoms of UTI.   Hospital Course: Acute on chronic combined systolic and diastolic CHF /severe mitral  regurgitation/severe TR -patient was admitted to the hospital and cardiology was consulted.  Heart failure team followed.  She underwent a right heart cath which showed increased pressures, and she was placed on high-dose Lasix.  She responded well, weight on admission was 261 pounds and on discharge 244 pounds.  Her kidney function is remained stable, she was placed back on p.o. torsemide.  She is being evaluated for MitraClip and it is possible that correction of her mitral regurgitation may lessen the degree of RV failure.  She is to be seen by Dr. Burt Knack as an outpatient in a week.  Continue diuresis and home regimen as outlined below. Chronic kidney disease stage IV -Creatinine remains stable, appreciate nephrology follow-up OSA -Continue BiPAP, compliant in the hospital Left great toe injury -Continue topical ointment Concern for pneumonia -Family had concerns for pneumonia based on the chest x-ray on admission, however she has no leukocytosis, afebrile, no productive cough, now monitor off antibiotics.  She has remained afebrile and without issues. History of AV node ablation for A. 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   Tape Other (See Comments)   States plastic tape blisters her skin   Ciprofloxacin Hives   Latex Itching, Rash   Penicillins Hives   Coreg [carvedilol] Other (See Comments)   Beta Blockers cause her organs to shut down per patient      Medication List    TAKE these medications   allopurinol 100 MG tablet Commonly known as:  ZYLOPRIM Take 100 mg by mouth 2 (two) times daily.   aspirin 325 MG tablet Take 325 mg by mouth every evening.   calcitRIOL 0.25 MCG capsule Commonly known as:  ROCALTROL Take 0.25 mcg by mouth every other day.   calcium carbonate 600 MG tablet Commonly known as:  OS-CAL Take 600 mg by mouth daily.   COD LIVER OIL PO Take 1 tablet by mouth daily.   docusate sodium 100 MG capsule Commonly known as:  COLACE Take 200 mg by mouth daily as needed for mild constipation.   feeding supplement Liqd Take 1 Container by mouth daily.   ferrous sulfate 325 (65 FE) MG tablet Take 325 mg by mouth every evening.   Glycerin (Adult) 2.1 g Supp Place 1 suppository rectally daily as needed for moderate constipation.   HYDROcodone-acetaminophen 10-325 MG tablet Commonly known as:  NORCO Take 1 tablet by mouth 3 (three) times daily.   isosorbide mononitrate 30 MG 24 hr tablet Commonly known as:  IMDUR Take 0.5 tablets (15 mg total) by mouth daily.   metolazone 2.5 MG tablet Commonly known as:  ZAROXOLYN Take 1 tablet (2.5 mg total) by mouth as needed. Take once as instructed. What changed:    when to take this  reasons to take this   multivitamin tablet Take 1 tablet by mouth daily.   nystatin powder Commonly known as:  MYCOSTATIN/NYSTOP Apply 1 application topically daily.   nystatin-triamcinolone ointment Commonly known as:  MYCOLOG Apply topically 2 (two) times daily.   potassium chloride SA 20 MEQ tablet Commonly known as:  K-DUR,KLOR-CON Take 1  tablet (20 mEq total) by mouth 2 (two) times daily.   torsemide 20 MG tablet Commonly known as:  DEMADEX TAKE 4 TABLETS (80MG) BY MOUTH IN THE MORNING AND EACH EVENING DAILY.   vitamin B-12 500 MCG tablet Commonly known as:  CYANOCOBALAMIN Take 500 mcg by mouth daily.   white petrolatum Gel Commonly known as:  VASELINE Apply 1 application topically daily as needed for dry skin.      Follow-up Information    Sherren Mocha, MD. Go on 07/10/2018.   Specialty:  Cardiology Why:  @ 11:20am to discuss your heart valve problem  Contact information: [address] [address] [phone]        [facility], [facility] Follow up.   Why:  HHPT Contact information: [place] [address] [place]. Go on 07/15/2018.   Specialty:  Cardiology Why:  at 10:30 in the [facility].  Gate Code 1700.  Please bring all medications to appt. 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   Dg Chest 2 View  Result Date: 06/25/2018 CLINICAL DATA:  Episodes of shortness of breath since being admitted for pneumonia, history non ischemic cardiomyopathy, cervical cancer, atrial fibrillation, CHF, hypertension, GERD, former smoker EXAM: CHEST - 2 VIEW COMPARISON:  06/20/2018 FINDINGS: LEFT subclavian pacemaker with lead projecting over RIGHT ventricle. Enlargement of cardiac silhouette with pulmonary vascular congestion.  Mediastinal contours normal. Atherosclerotic calcification aorta. Small to moderate RIGHT pleural effusion and basilar atelectasis. Minimal perihilar edema questioned. No pneumothorax or acute osseous findings. Bones demineralized with glenohumeral degenerative changes bilaterally. IMPRESSION: Enlargement of cardiac silhouette pulmonary vascular congestion and question minimal pulmonary edema. Small to moderate RIGHT pleural effusion and RIGHT basilar atelectasis. Electronically Signed   By: Lavonia Dana M.D.   On: 06/25/2018 14:25   Dg Chest 2 View  Result Date: 06/20/2018 CLINICAL DATA:  Chest pain and dyspnea worse since Sunday. EXAM: CHEST - 2 VIEW COMPARISON:  01/30/2018 FINDINGS: Obscuration of the right heart border and right hemidiaphragm from pulmonary consolidation and probable superimposed pleural fluid. Enlarged cardiac silhouette. Pulmonary vascular redistribution and chronic interstitial prominence. Moderate aortic atherosclerosis at the arch. Left-sided pacemaker apparatus with right ventricular lead noted. IMPRESSION: New confluent opacities at the right lung base obscuring the right heart border and right hemidiaphragm suspicious for pulmonary consolidation/pneumonia. Superimposed pleural fluid is suspected . Electronically Signed   By: Ashley Royalty M.D.   On: 06/20/2018 23:30      Subjective: - no chest pain, shortness of breath, no abdominal pain, nausea or vomiting.  She is getting dyspneic with activity  Discharge Exam: Vitals:   07/02/18 0800 07/02/18 1205  BP: 123/80 135/74  Pulse: (!) 101 96  Resp: 18 18  Temp: 98.3 F (36.8 C) 98.1 F (36.7 C)  SpO2: 96% 96%    General: Pt is alert, awake, not in acute distress Cardiovascular: RRR, S1/S2 +, no rubs, no gallops Respiratory: CTA bilaterally, no wheezing, no rhonchi Abdominal: Soft, NT, ND, bowel sounds + Extremities: no edema, no cyanosis    The results of significant diagnostics from this hospitalization  (including imaging, microbiology, ancillary and laboratory) are listed below for reference.     Microbiology: No results found for this or any previous visit (from the past 240 hour(s)).   Labs: BNP (last 3 results) Recent Labs    06/20/18 2249  BNP 159.4*   Basic Metabolic Panel: Recent Labs  Lab 06/26/18 0339  06/28/18 0534 06/29/18 0212 06/30/18 0840 07/01/18 0232 07/02/18 0149  NA 141   < > 140 140 138 138 139  K 4.3   < > 4.4 4.2 4.0 3.8 3.8  CL 99   < > 100 96* 93* 92* 91*  CO2 35*   < > 32 33* 34* 35* 38*  GLUCOSE 92   < > 90 83 125* 91 88  BUN 73*   < > 73* 72* 70* 75* 78*  CREATININE 2.57*   < > 2.46* 2.48* 2.37* 2.40* 2.54*  CALCIUM 9.3   < > 9.5 9.5 9.9 9.7  9.7  PHOS 3.5  --   --   --   --   --   --    < > = values in this interval not displayed.   Liver Function Tests: Recent Labs  Lab 06/26/18 0339  ALBUMIN 3.7   No results for input(s): LIPASE, AMYLASE in the last 168 hours. No results for input(s): AMMONIA in the last 168 hours. CBC: Recent Labs  Lab 06/26/18 0339 06/27/18 0353 06/30/18 0840  WBC 4.4 4.5 5.1  NEUTROABS 2.8  --   --   HGB 10.3* 10.2* 11.0*  HCT 33.1* 32.2* 34.4*  MCV 100.3* 100.0 98.3  PLT 105* 106* 111*   Cardiac Enzymes: No results for input(s): CKTOTAL, CKMB, CKMBINDEX, TROPONINI in the last 168 hours. BNP: Invalid input(s): POCBNP CBG: No results for input(s): GLUCAP in the last 168 hours. D-Dimer No results for input(s): DDIMER in the last 72 hours. Hgb A1c No results for input(s): HGBA1C in the last 72 hours. Lipid Profile No results for input(s): CHOL, HDL, LDLCALC, TRIG, CHOLHDL, LDLDIRECT in the last 72 hours. Thyroid function studies No results for input(s): TSH, T4TOTAL, T3FREE, THYROIDAB in the last 72 hours.  Invalid input(s): FREET3 Anemia work up No results for input(s): VITAMINB12, FOLATE, FERRITIN, TIBC, IRON, RETICCTPCT in the last 72 hours. Urinalysis    Component Value Date/Time   COLORURINE  YELLOW 12/01/2016 1224   APPEARANCEUR CLEAR 12/01/2016 1224   LABSPEC 1.009 12/01/2016 1224   PHURINE 6.0 12/01/2016 1224   GLUCOSEU NEGATIVE 12/01/2016 1224   HGBUR SMALL (A) 12/01/2016 1224   BILIRUBINUR NEGATIVE 12/01/2016 1224   KETONESUR NEGATIVE 12/01/2016 1224   PROTEINUR NEGATIVE 12/01/2016 1224   UROBILINOGEN 0.2 03/06/2013 2035   NITRITE NEGATIVE 12/01/2016 1224   LEUKOCYTESUR LARGE (A) 12/01/2016 1224   Sepsis Labs Invalid input(s): PROCALCITONIN,  WBC,  LACTICIDVEN   Time coordinating discharge: 40 minutes  SIGNED:  Marzetta Board, MD  [facility] 07/02/2018, 2:05 PM Pager [phone]  If 7PM-7AM, please contact night-coverage [email or web address] Password [email or web address]

## 2018-07-02 NOTE — Progress Notes (Addendum)
[facility] ROUNDING NOTE   Subjective:     > 1.3L UOP IV diuretics yesterday weight down slightly  K 3.8, SCr 2.54  AHF transitioned to PO Torsemide 80 BID  Patient without complaints   Objective:  Vital signs in last 24 hours:  Temp:  [97.8 F (36.6 C)-98.5 F (36.9 C)] 98.3 F (36.8 C) (10/08 0800) Pulse Rate:  [90-101] 101 (10/08 0800) Resp:  [18] 18 (10/08 0800) BP: (100-123)/(67-80) 123/80 (10/08 0800) SpO2:  [96 %] 96 % (10/08 0800) Weight:  [110.9 kg] 110.9 kg (10/08 0524)  Weight change: -0.363 kg Filed Weights   06/30/18 0300 07/01/18 0500 07/02/18 0524  Weight: 112.4 kg 111.3 kg 110.9 kg    Intake/Output: I/O last 3 completed shifts: In: 2196.1 [P.O.:240; I.V.:6; IV Piggyback:1950.1] Out: 2450 [Urine:2450]   Intake/Output this shift:  No intake/output data recorded. Gen - resting comfortably Lungs - normal WOB today, rales improved ABD- BS present soft non-distended EXT-Trace to 1+ edema BL with ruddy scaling discoloration L > R which she says is chronic - slightly improved c/w yesterday   Basic Metabolic Panel: Recent Labs  Lab 06/26/18 0339  06/28/18 0534 06/29/18 0212 06/30/18 0840 07/01/18 0232 07/02/18 0149  NA 141   < > 140 140 138 138 139  K 4.3   < > 4.4 4.2 4.0 3.8 3.8  CL 99   < > 100 96* 93* 92* 91*  CO2 35*   < > 32 33* 34* 35* 38*  GLUCOSE 92   < > 90 83 125* 91 88  BUN 73*   < > 73* 72* 70* 75* 78*  CREATININE 2.57*   < > 2.46* 2.48* 2.37* 2.40* 2.54*  CALCIUM 9.3   < > 9.5 9.5 9.9 9.7 9.7  PHOS 3.5  --   --   --   --   --   --    < > = values in this interval not displayed.    Liver Function Tests: Recent Labs  Lab 06/26/18 0339  ALBUMIN 3.7   No results for input(s): LIPASE, AMYLASE in the last 168 hours. No results for input(s): AMMONIA in the last 168 hours.  CBC: Recent Labs  Lab 06/26/18 0339 06/27/18 0353 06/30/18 0840  WBC 4.4 4.5 5.1  NEUTROABS 2.8  --   --   HGB 10.3* 10.2* 11.0*   HCT 33.1* 32.2* 34.4*  MCV 100.3* 100.0 98.3  PLT 105* 106* 111*    Cardiac Enzymes: No results for input(s): CKTOTAL, CKMB, CKMBINDEX, TROPONINI in the last 168 hours.  BNP: Invalid input(s): POCBNP  CBG: No results for input(s): GLUCAP in the last 168 hours.  Microbiology: Results for orders placed or performed during the hospital encounter of 06/20/18  Blood culture (routine x 2)     Status: None   Collection Time: 06/21/18 12:49 AM  Result Value Ref Range Status   Specimen Description BLOOD LEFT WRIST  Final   Special Requests   Final    BOTTLES DRAWN AEROBIC AND ANAEROBIC Blood Culture results may not be optimal due to an excessive volume of blood received in culture bottles   Culture   Final    NO GROWTH 5 DAYS Performed at [facility], [place] [address] [place], [place] [address]    Report Status 06/26/2018 FINAL  Final  Blood culture (routine x 2)     Status: None   Collection Time: 06/21/18 12:57 AM  Result Value Ref Range Status  Specimen Description BLOOD RIGHT WRIST  Final   Special Requests   Final    BOTTLES DRAWN AEROBIC AND ANAEROBIC Blood Culture results may not be optimal due to an excessive volume of blood received in culture bottles   Culture   Final    NO GROWTH 5 DAYS Performed at [facility], [place] [address] [place], [place] [address]    Report Status 06/26/2018 FINAL  Final    Coagulation Studies: No results for input(s): LABPROT, INR in the last 72 hours.  Urinalysis: No results for input(s): COLORURINE, LABSPEC, PHURINE, GLUCOSEU, HGBUR, BILIRUBINUR, KETONESUR, PROTEINUR, UROBILINOGEN, NITRITE, LEUKOCYTESUR in the last 72 hours.  Invalid input(s): APPERANCEUR    Imaging: No results found.   Medications:   . sodium chloride    . sodium chloride     . acetaZOLAMIDE  500 mg Oral Q12H  . allopurinol  100 mg Oral BID  . aspirin  325 mg Oral QPM  . calcitRIOL  0.25 mcg Oral QODAY  . calcium carbonate  1,250 mg Oral Q  lunch  . enoxaparin (LOVENOX) injection  30 mg Subcutaneous Q24H  . ferrous sulfate  325 mg Oral QPM  . HYDROcodone-acetaminophen  1 tablet Oral TID  . isosorbide mononitrate  15 mg Oral Daily  . lactose free nutrition  237 mL Oral TID WC  . multivitamin with minerals  1 tablet Oral Daily  . neomycin-bacitracin-polymyxin   Topical Daily  . nystatin   Topical Daily  . polyethylene glycol  17 g Oral Daily  . potassium chloride  20 mEq Oral BID  . senna-docusate  1 tablet Oral QHS  . sodium chloride flush  3 mL Intravenous Q12H  . sodium chloride flush  3 mL Intravenous Q12H  . torsemide  80 mg Oral BID  . vitamin B-12  500 mcg Oral Daily   sodium chloride, sodium chloride, acetaminophen, Glycerin (Adult), hydrALAZINE, HYDROcodone-acetaminophen, hydrOXYzine, nitroGLYCERIN, ondansetron (ZOFRAN) IV, sodium chloride flush, sodium chloride flush, zolpidem  Assessment/ Plan:   Chronic renal insufficiency stage IV baseline creatinine 2.5-3.0 with currently stable renal function during IV diuresis.  Stable today.     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 Changed to PO torsemide today. Cardiology considering mitral clip will follow-up as an outpatient.    Dyspnea:   CXR 10/2 with small R effusion o/w improved c/w admission.  Dyspnea improved in past few days with diuresis  Hypertension: off hydralazine currently with BPs lower; on imdur 15  Anemia Hb stable no ESA at this time continues oral ferrous sulfate  BMM Calcitrol 0.25 mcg daily she uses calcium carbonate once daily  History of atrial fibrillation - she has refused anticoagulation  Obstructive sleep apnea continues on BiPAP with home machine  Signing off, will go ahead and arrange outpatient follow-up.  No further suggestions at the current time.  I will make sure has lots arranged.   LOS: 10 [employer] Student B @TODAY @9 :03 AM

## 2018-07-02 NOTE — Progress Notes (Signed)
Patient scheduled [facility] hospital follow-up appt 10/21 at 10:30am.

## 2018-07-10 ENCOUNTER — Ambulatory Visit (INDEPENDENT_AMBULATORY_CARE_PROVIDER_SITE_OTHER): Payer: Medicare Other | Admitting: Cardiovascular Disease

## 2018-07-10 ENCOUNTER — Encounter: Payer: Self-pay | Admitting: Cardiovascular Disease

## 2018-07-10 VITALS — BP 110/72 | HR 96 | Ht 66.0 in

## 2018-07-10 DIAGNOSIS — I5022 Chronic systolic (congestive) heart failure: Secondary | ICD-10-CM | POA: Diagnosis not present

## 2018-07-10 DIAGNOSIS — I340 Nonrheumatic mitral (valve) insufficiency: Secondary | ICD-10-CM | POA: Diagnosis not present

## 2018-07-10 DIAGNOSIS — N184 Chronic kidney disease, stage 4 (severe): Secondary | ICD-10-CM | POA: Diagnosis not present

## 2018-07-10 NOTE — Progress Notes (Addendum)
Cardiology Office Note:    Date:  07/14/2018   ID:  [name] [name], DOB June 01, 1946, MRN [id number]  PCP:  [name], MD  Cardiologist:  Dorris Carnes, MD  Electrophysiologist:  None   Referring MD: [name], MD   Chief Complaint  Patient presents with  . Shortness of Breath   History of Present Illness:    [name] [name] is a 72 y.o. female with a hx of congestive heart failure who is referred for consideration of percutaneous mitral valve repair in the setting of severe mitral regurgitation.  The patient is a morbidly obese woman who is been essentially nonambulatory now for several years.  She has a history of pulmonary hypertension, obstructive sleep apnea, chronic combined systolic and diastolic heart failure, chronic kidney disease stage IV, severe mitral and tricuspid regurgitation, and atrial fibrillation with history of AV node ablation and Saint Jude permanent pacemaker placement.  She was recently hospitalized with worsening shortness of breath, leg swelling, orthopnea, and cough.  Clinical and radiographic findings were all consistent with congestive heart failure.  She improved with IV diuresis.  An echocardiogram demonstrated mild segmental LV dysfunction, severe mitral and tricuspid regurgitation, marked biatrial enlargement.  The patient underwent right heart catheterization demonstrating moderate pulmonary hypertension, severely elevated right atrial pressure with large V waves, elevated wedge pressure with V waves of 35 to 40 mmHg, and preserved cardiac output.  Transesophageal echo demonstrated findings suggestive of restrictive cardiomyopathy with massive biatrial enlargement, 3+ mitral regurgitation with some restriction of the posterior leaflet, and severe 4+ tricuspid regurgitation.  The patient is here with her daughter today.  She primarily complains of generalized weakness, but does admit to shortness of breath with activity.  She is had no chest pain or  pressure.  He does have orthopnea which is chronic.  Leg swelling is essentially unchanged since hospital discharge.  She is tolerating torsemide twice daily and has not required as needed use of metolazone since hospital discharge.  She is primarily limited by severe leg weakness and knee pain.  Despite her physical limitations, she still enjoys her family and is able to participate in family activities with her daughter and granddaughter.   Past Medical History:  Diagnosis Date  . Anemia   . Atrial fibrillation ([place])   . Cataract   . Cervical cancer ([place]) 1991   s/p hysterectomy  . Chronic cellulitis   . Chronic combined systolic and diastolic congestive heart failure, NYHA class 2 ([facility])    LVEF 25-30% with restrictive diastolic filling  . Degenerative joint disease   . Diverticulitis   . Essential hypertension   . GERD (gastroesophageal reflux disease)   . Gout   . Kidney stones   . Morbid obesity ([place])   . NICM (nonischemic cardiomyopathy) ([place]) 02/22/2017  . Osteoarthritis   . Permanent atrial fibrillation 05/04/2009   Qualifier: History of  By: Quentin Cornwall CMA, Janett Billow    . 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   Qualifier: Diagnosis of  By: Lovena Le, MD, [facility], Binnie Kand   . Sinoatrial node dysfunction [facility])    Status post PPM - Dr. Lovena Le  . Sleep apnea    CPAP    Past Surgical History:  Procedure Laterality Date  . ABDOMINAL HYSTERECTOMY  1991  . COLONOSCOPY  1998   one polyp per patient  . EYE SURGERY    . PACEMAKER INSERTION  Nov 2000   St Jude with revision in 2011  . PERCUTANEOUS NEPHROLITHOTOMY  April 2012  .  RIGHT HEART CATH N/A 06/28/2018   Procedure: RIGHT HEART CATH;  Surgeon: Jolaine Artist, MD;  Location: [place] [facility];  Service: Cardiovascular;  Laterality: N/A;  . TEE WITHOUT CARDIOVERSION N/A 06/28/2018   Procedure: TRANSESOPHAGEAL ECHOCARDIOGRAM (TEE);  Surgeon: Jolaine Artist, MD;  Location: [facility] ENDOSCOPY;  Service: Cardiovascular;   Laterality: N/A;    Current Medications: Current Meds  Medication Sig  . allopurinol (ZYLOPRIM) 100 MG tablet Take 100 mg by mouth 2 (two) times daily.  Marland Kitchen aspirin 325 MG tablet Take 325 mg by mouth every evening.   . calcitRIOL (ROCALTROL) 0.25 MCG capsule Take 0.25 mcg by mouth every other day.   . calcium carbonate (OS-CAL) 600 MG tablet Take 600 mg by mouth daily.  . COD LIVER OIL PO Take 1 tablet by mouth daily.    Marland Kitchen docusate sodium (COLACE) 100 MG capsule Take 200 mg by mouth daily as needed for mild constipation.   . feeding supplement (BOOST HIGH PROTEIN) LIQD Take 1 Container by mouth daily.  . ferrous sulfate 325 (65 FE) MG tablet Take 325 mg by mouth every evening.   . Glycerin, Adult, 2.1 g SUPP Place 1 suppository rectally daily as needed for moderate constipation.  Marland Kitchen HYDROcodone-acetaminophen (NORCO) 10-325 MG per tablet Take 1 tablet by mouth 3 (three) times daily.   . isosorbide mononitrate (IMDUR) 30 MG 24 hr tablet Take 0.5 tablets (15 mg total) by mouth daily.  . metolazone (ZAROXOLYN) 2.5 MG tablet Take 1 tablet (2.5 mg total) by mouth as needed. Take once as instructed. (Patient taking differently: Take 2.5 mg by mouth daily as needed (fluid). Take once as instructed. )  . Multiple Vitamin (MULTIVITAMIN) tablet Take 1 tablet by mouth daily.  Marland Kitchen nystatin (MYCOSTATIN/NYSTOP) powder Apply 1 application topically daily.   Marland Kitchen nystatin-triamcinolone ointment (MYCOLOG) Apply topically 2 (two) times daily.  . potassium chloride SA (K-DUR,KLOR-CON) 20 MEQ tablet Take 1 tablet (20 mEq total) by mouth 2 (two) times daily.  Marland Kitchen torsemide (DEMADEX) 20 MG tablet TAKE 4 TABLETS (80MG) BY MOUTH IN THE MORNING AND EACH EVENING DAILY.  . vitamin B-12 (CYANOCOBALAMIN) 500 MCG tablet Take 500 mcg by mouth daily.  . white petrolatum (VASELINE) GEL Apply 1 application topically daily as needed for dry skin.     Allergies:   Cephalexin; Codeine; Contrast media [iodinated diagnostic agents];  Peppermint flavor; Prednisone; Tape; Ciprofloxacin; Latex; Penicillins; and Coreg [carvedilol]   Social History   Socioeconomic History  . Marital status: Widowed    Spouse name: Not on file  . Number of children: Not on file  . Years of education: Not on file  . Highest education level: Not on file  Occupational History  . Not on file  Social Needs  . Financial resource strain: Not on file  . Food insecurity:    Worry: Not on file    Inability: Not on file  . Transportation needs:    Medical: Not on file    Non-medical: Not on file  Tobacco Use  . Smoking status: Former Smoker    Packs/day: 1.00    Types: Cigarettes    Last attempt to quit: 09/26/1979    Years since quitting: 38.8  . Smokeless tobacco: Never Used  Substance and Sexual Activity  . Alcohol use: No  . Drug use: No  . Sexual activity: Never  Lifestyle  . Physical activity:    Days per week: Not on file    Minutes per session: Not on file  .  Stress: Not on file  Relationships  . Social connections:    Talks on phone: Not on file    Gets together: Not on file    Attends religious service: Not on file    Active member of club or organization: Not on file    Attends meetings of clubs or organizations: Not on file    Relationship status: Not on file  Other Topics Concern  . Not on file  Social History Narrative  . Not on file     Family History: The patient's family history includes Colon cancer in her other; Diabetes in her sister; Heart attack in her father; Stroke in her mother. There is no history of Liver disease or Other.  ROS:   Please see the history of present illness.    Positive for back pain, muscle pain, rash, easy bruising, leg pain, walking problems.  All other systems reviewed and are negative.  EKGs/Labs/Other Studies Reviewed:    The following studies were reviewed today: Echo 06-21-2018: Study Conclusions  - Left ventricle: The cavity size was severely dilated. Wall   thickness  was increased in a pattern of mild LVH. Systolic   function was mildly to moderately reduced. The estimated ejection   fraction was in the range of 40% to 45%. There is akinesis of the   mid-apicalanteroseptal and apical myocardium. The study is not   technically sufficient to allow evaluation of LV diastolic   function. - Aortic valve: Valve mobility was restricted. There was mild   stenosis. - Mitral valve: Calcified annulus. There was severe regurgitation. - Left atrium: The atrium was severely dilated. - Right atrium: The atrium was severely dilated. - Tricuspid valve: There was moderate regurgitation. - Pulmonary arteries: Systolic pressure was mildly increased. PA   peak pressure: 43 mm Hg (S).  Impressions:  - Akinesis of the distal septum and apex; overall mild to moderate   LV dysfunction; severe LVE; mild AS (mean gradient 14 mmHg);   severe MR; severe biatrial enlargement; moderate TR with mild   pulmonary hypertension.  TEE: Study Conclusions  - Left ventricle: Systolic function was mildly to moderately   reduced. The estimated ejection fraction was in the range of 40%   to 45%. Septal dyssynchrony due to RV pacing. - Aortic valve: There was very mild stenosis. - Mitral valve: Mobility of the posterior leaflet was mildly   restricted. There was moderate to severe regurgitation directed   centrally. - Left atrium: The atrium was massively dilated. No evidence of   thrombus in the atrial cavity or appendage. - Right atrium: The atrium was massively dilated. - Atrial septum: There was a small patent foramen ovale. - Pulmonic valve: No evidence of vegetation. - Impressions: Findings suggestive of restrictive cardiomyopathy   with massive biatrial enlargement. There is 3+ MR and 4+ TR.  Impressions:  - Findings suggestive of restrictive cardiomyopathy with massive   biatrial enlargement. There is 3+ MR and 4+ TR.  Findings:  RA = 19 with prominent  v-waves RV = 53/19 PA = 58/26 (40) PCW = 27 v waves = 35-40 Fick cardiac output/index = 6.4/2.9 PVR = 2.0 WU Ao sat = 98% PA sat = 63%, 67%  Assessment:  1. Elevated biventricular pressures with moderate pulmonary venous HTN and prominent v-waves in RA and PCWP tracings suggestive of significant MR/TR 2. Normal cardiac output  Plan/Discussion:  Increase diuretics. TEE later today as part of w/u for MitraClip.    Recent Labs: 06/20/2018: B  Natriuretic Peptide 160.6 06/22/2018: Magnesium 2.7 06/23/2018: TSH 4.265 06/30/2018: Hemoglobin 11.0; Platelets 111 07/02/2018: BUN 78; Creatinine, Ser 2.54; Potassium 3.8; Sodium 139  Recent Lipid Panel    Component Value Date/Time   CHOL 114 06/21/2018 0254   TRIG 48 06/21/2018 0254   HDL 47 06/21/2018 0254   CHOLHDL 2.4 06/21/2018 0254   VLDL 10 06/21/2018 0254   LDLCALC 57 06/21/2018 0254    Physical Exam:    VS:  BP 110/72   Pulse 96   Ht 5' 6"  (1.676 m)   SpO2 98%   BMI 39.48 kg/m     Wt Readings from Last 3 Encounters:  07/02/18 244 lb 9.6 oz (110.9 kg)  05/30/18 257 lb (116.6 kg)  04/10/18 260 lb 6.4 oz (118.1 kg)     GEN: obese woman, in wheelchair, in no acute distress HEENT: Normal NECK: JVP elevated; No carotid bruits LYMPHATICS: No lymphadenopathy CARDIAC: RRR with 3/6 systolic murmur at the left sternal border RESPIRATORY:  Clear to auscultation without rales, wheezing or rhonchi  ABDOMEN: Soft, non-tender, obese MUSCULOSKELETAL:  1+ bilateral pretibial edema; No deformity  SKIN: Warm and dry, stasis changes bilaterally NEUROLOGIC:  Alert and oriented x 3 PSYCHIATRIC:  Normal affect   ASSESSMENT:    1. Chronic systolic heart failure ([place])   2. Severe mitral regurgitation    PLAN:    In order of problems listed above:  1. 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  I have personally reviewed the patient's echo and TEE images as well as her cardiac catheterization data.  I suspect the etiology of her mitral regurgitation is related to massive atrial enlargement with left and right atrial diameters greater than 7 and 8 cm, respectively, as well as a component of posterior leaflet restriction.  From my review of her images, she has 3+ mitral regurgitation but has a very prominent 4+ tricuspid regurgitation as reflected on her TEE images as well as the V wave seen in her right atrial tracing from right heart catheterization.  Her right atrial pressure is 19 with moderate pulmonary hypertension.  Considering all of her comorbid conditions and prominent tricuspid regurgitation and right heart failure, I am skeptical that percutaneous mitral valve repair will make a significant impact on her outcome.  I will review her clinical case and TEE images with our multidisciplinary heart valve team and will touch base with the patient in follow-up after we can review her case further.  She is willing to consider anything that may be helpful, but is not inclined to pursue any procedure with a low likelihood of clinical improvement.   Medication Adjustments/Labs and Tests Ordered: Current medicines are reviewed at length with the patient today.  Concerns regarding medicines are outlined above.  No orders of the defined types were placed in this encounter.  No orders of the defined types were placed in this encounter.   Patient Instructions  Medication Instructions:  Your provider recommends that you continue on your current medications as directed. Please refer to the Current Medication list given to you today.    Labwork: None  Testing/Procedures: None  Follow-Up: Keep your follow-up appointments. We will be in touch!  Any Other Special Instructions Will Be Listed  Below (If Applicable).     If you need a refill on your  cardiac medications before your next appointment, please call your pharmacy.     Signed, Sherren Mocha, MD  07/14/2018 1:20 PM    [facility]  ADDENDUM: The patient's case is reviewed with the structural heart team.  We all agreed that considering the degree of right heart failure and severe tricuspid regurgitation, it is unlikely she would benefit from MitraClip.  I called her and we discussed this.  She is comfortable continuing on with medical therapy.  Sherren Mocha 07/16/2018 5:33 PM

## 2018-07-10 NOTE — Patient Instructions (Signed)
Medication Instructions:  Your provider recommends that you continue on your current medications as directed. Please refer to the Current Medication list given to you today.    Labwork: None  Testing/Procedures: None  Follow-Up: Keep your follow-up appointments. We will be in touch!  Any Other Special Instructions Will Be Listed Below (If Applicable).     If you need a refill on your cardiac medications before your next appointment, please call your pharmacy.

## 2018-07-15 ENCOUNTER — Encounter (HOSPITAL_COMMUNITY): Payer: Self-pay

## 2018-07-15 ENCOUNTER — Ambulatory Visit (HOSPITAL_COMMUNITY)
Admit: 2018-07-15 | Discharge: 2018-07-15 | Disposition: A | Discharge status: Home / Self Care | Payer: Medicare Other | Attending: Internal Medicine | Admitting: Internal Medicine

## 2018-07-15 VITALS — BP 126/74 | Temp 97.0°F | Wt 244.0 lb

## 2018-07-15 DIAGNOSIS — I272 Pulmonary hypertension, unspecified: Secondary | ICD-10-CM | POA: Insufficient documentation

## 2018-07-15 DIAGNOSIS — G4733 Obstructive sleep apnea (adult) (pediatric): Secondary | ICD-10-CM | POA: Diagnosis not present

## 2018-07-15 DIAGNOSIS — D509 Iron deficiency anemia, unspecified: Secondary | ICD-10-CM | POA: Diagnosis not present

## 2018-07-15 DIAGNOSIS — Z90710 Acquired absence of both cervix and uterus: Secondary | ICD-10-CM | POA: Diagnosis not present

## 2018-07-15 DIAGNOSIS — Z87891 Personal history of nicotine dependence: Secondary | ICD-10-CM | POA: Diagnosis not present

## 2018-07-15 DIAGNOSIS — Z8249 Family history of ischemic heart disease and other diseases of the circulatory system: Secondary | ICD-10-CM | POA: Diagnosis not present

## 2018-07-15 DIAGNOSIS — K219 Gastro-esophageal reflux disease without esophagitis: Secondary | ICD-10-CM | POA: Diagnosis not present

## 2018-07-15 DIAGNOSIS — R0602 Shortness of breath: Secondary | ICD-10-CM | POA: Diagnosis not present

## 2018-07-15 DIAGNOSIS — N184 Chronic kidney disease, stage 4 (severe): Secondary | ICD-10-CM | POA: Insufficient documentation

## 2018-07-15 DIAGNOSIS — I130 Hypertensive heart and chronic kidney disease with heart failure and stage 1 through stage 4 chronic kidney disease, or unspecified chronic kidney disease: Secondary | ICD-10-CM | POA: Insufficient documentation

## 2018-07-15 DIAGNOSIS — Z87442 Personal history of urinary calculi: Secondary | ICD-10-CM | POA: Insufficient documentation

## 2018-07-15 DIAGNOSIS — I5042 Chronic combined systolic (congestive) and diastolic (congestive) heart failure: Secondary | ICD-10-CM | POA: Diagnosis not present

## 2018-07-15 DIAGNOSIS — Z79899 Other long term (current) drug therapy: Secondary | ICD-10-CM | POA: Diagnosis not present

## 2018-07-15 DIAGNOSIS — M1990 Unspecified osteoarthritis, unspecified site: Secondary | ICD-10-CM | POA: Diagnosis not present

## 2018-07-15 DIAGNOSIS — I4821 Permanent atrial fibrillation: Secondary | ICD-10-CM | POA: Insufficient documentation

## 2018-07-15 DIAGNOSIS — I340 Nonrheumatic mitral (valve) insufficiency: Secondary | ICD-10-CM | POA: Insufficient documentation

## 2018-07-15 DIAGNOSIS — Z8541 Personal history of malignant neoplasm of cervix uteri: Secondary | ICD-10-CM | POA: Diagnosis not present

## 2018-07-15 DIAGNOSIS — I425 Other restrictive cardiomyopathy: Secondary | ICD-10-CM | POA: Diagnosis not present

## 2018-07-15 DIAGNOSIS — I5022 Chronic systolic (congestive) heart failure: Secondary | ICD-10-CM | POA: Diagnosis not present

## 2018-07-15 DIAGNOSIS — Z7982 Long term (current) use of aspirin: Secondary | ICD-10-CM | POA: Insufficient documentation

## 2018-07-15 DIAGNOSIS — I5043 Acute on chronic combined systolic (congestive) and diastolic (congestive) heart failure: Secondary | ICD-10-CM

## 2018-07-15 DIAGNOSIS — Z950 Presence of cardiac pacemaker: Secondary | ICD-10-CM | POA: Insufficient documentation

## 2018-07-15 DIAGNOSIS — M109 Gout, unspecified: Secondary | ICD-10-CM | POA: Diagnosis not present

## 2018-07-15 MED ORDER — TORSEMIDE 20 MG PO TABS: ORAL_TABLET | ORAL | 7 refills | Status: DC

## 2018-07-15 NOTE — Patient Instructions (Signed)
Be sure to take an additional 20 mg of Torsemide as needed for weights greater than 246 lbs.  Your physician recommends that you schedule a follow-up appointment in: 6 weeks with Dr Haroldine Laws   Do the following things EVERYDAY: 1) Weigh yourself in the morning before breakfast. Write it down and keep it in a log. 2) Take your medicines as prescribed 3) Eat low salt foods-Limit salt (sodium) to 2000 mg per day.  4) Stay as active as you can everyday 5) Limit all fluids for the day to less than 2 liters

## 2018-07-15 NOTE — Progress Notes (Signed)
PCP: Dr Lauretta Grill  Primary Cardiologist: Dr Harrington Challenger  Structural Heart: Dr Burt Knack EP: Dr Lovena Le Nephrology: Dr Deterding  HPI: [name] [name] a 72 y.o.femalewith a history of morbid obesity, afib s/p AV ablation andSt JudePPM, chronic combined HF, CKD IV, severe MR, TR, pulmonary HTN, OSA on BiPAP, GERD, gout, and iron deficiency anemia.  Admitted 06/20/18 with CP and SOB.ECHo was completed and showed severe MR. Cause of cardiomyopathy uncertain.  Could be valvular, could be due to chronic RV pacing . Underwent RHC and TEE. Diuresed with IV lasix and transistioned to torsemide 80 mg twice a day. Discharge weight 244.6 pounds.   She saw Dr Burt Knack on 10/16 for possible mitral clip. There was concern mitral clip may not make a difference due to multiple co-morbidities. The structural heart team will evaluate and follow up with her.   Today she returns for post hospital follow up with her daughter. Says she devloped a rash after she started taking diamox.The rash has improved since discharge. Overall feeling fine. SOB with exertion but this has been her baseline. Denies PND/Orthopnea. Continues to use Bipap every night.  Limited mobility. Able to walk a few steps. Appetite ok. No fever or chills. Weight at home has been 240-244 pounds. Taking all medications. Lives with her 2 daughters.   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   Echo (06/21/2018): EF 40-45% Moderate TR, Severe MR   ROS: All systems negative except as listed in HPI, PMH and Problem List.  SH:  Social History   Socioeconomic History  . Marital status: Widowed    Spouse name: Not on file  . Number of children: Not on file  . Years of education: Not  on file  . Highest education level: Not on file  Occupational History  . Not on file  Social Needs  . Financial resource strain: Not on file  . Food insecurity:    Worry: Not on file    Inability: Not on file  . Transportation needs:    Medical: Not on file    Non-medical: Not on file  Tobacco Use  . Smoking status: Former Smoker    Packs/day: 1.00    Types: Cigarettes    Last attempt to quit: 09/26/1979    Years since quitting: 38.8  . Smokeless tobacco: Never Used  Substance and Sexual Activity  . Alcohol use: No  . Drug use: No  . Sexual activity: Never  Lifestyle  . Physical activity:    Days per week: Not on file    Minutes per session: Not on file  . Stress: Not on file  Relationships  . Social connections:    Talks on phone: Not on file    Gets together: Not on file    Attends religious service: Not on file    Active member of club or organization: Not on file    Attends meetings of clubs or organizations: Not on file    Relationship status: Not on file  . Intimate partner violence:    Fear of current or ex partner: Not on file    Emotionally abused: Not on file    Physically abused: Not on file    Forced  sexual activity: Not on file  Other Topics Concern  . Not on file  Social History Narrative  . Not on file    FH:  Family History  Problem Relation Age of Onset  . Heart attack Father   . Stroke Mother   . Diabetes Sister   . Colon cancer Other        seven family members  . Liver disease Neg Hx   . Other Neg Hx     Past Medical History:  Diagnosis Date  . Anemia   . Atrial fibrillation ([place])   . Cataract   . Cervical cancer ([place]) 1991   s/p hysterectomy  . Chronic cellulitis   . Chronic combined systolic and diastolic congestive heart failure, NYHA class 2 (HCC)    LVEF 25-30% with restrictive diastolic filling  . Degenerative joint disease   . Diverticulitis   . Essential hypertension   . GERD (gastroesophageal reflux disease)   . Gout    . Kidney stones   . Morbid obesity ([place])   . NICM (nonischemic cardiomyopathy) ([place]) 02/22/2017  . Osteoarthritis   . Permanent atrial fibrillation 05/04/2009   Qualifier: History of  By: Quentin Cornwall CMA, Janett Billow    . 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 Sinoatrial node dysfunction [facility])    Status post PPM - Dr. Lovena Le  . Sleep apnea    CPAP    Current Outpatient Medications  Medication Sig Dispense Refill  . allopurinol (ZYLOPRIM) 100 MG tablet Take 100 mg by mouth 2 (two) times daily.    Marland Kitchen aspirin 325 MG tablet Take 325 mg by mouth every evening.     . calcium carbonate (OS-CAL) 600 MG tablet Take 600 mg by mouth daily.    . COD LIVER OIL PO Take 1 tablet by mouth daily.      Marland Kitchen docusate sodium (COLACE) 100 MG capsule Take 200 mg by mouth daily as needed for mild constipation.     . feeding supplement (BOOST HIGH PROTEIN) LIQD Take 1 Container by mouth daily.    . ferrous sulfate 325 (65 FE) MG tablet Take 325 mg by mouth every evening.     . Glycerin, Adult, 2.1 g SUPP Place 1 suppository rectally daily as needed for moderate constipation.  0  . HYDROcodone-acetaminophen (NORCO) 10-325 MG per tablet Take 1 tablet by mouth 3 (three) times daily.     . isosorbide mononitrate (IMDUR) 30 MG 24 hr tablet Take 0.5 tablets (15 mg total) by mouth daily. 90 tablet 2  . metolazone (ZAROXOLYN) 2.5 MG tablet Take 1 tablet (2.5 mg total) by mouth as needed. Take once as instructed. (Patient taking differently: Take 2.5 mg by mouth daily as needed (fluid). Take once as instructed. ) 30 tablet 1  . Multiple Vitamin (MULTIVITAMIN) tablet Take 1 tablet by mouth daily.    Marland Kitchen nystatin (MYCOSTATIN/NYSTOP) powder Apply 1 application topically daily.     Marland Kitchen nystatin-triamcinolone ointment (MYCOLOG) Apply topically 2 (two) times daily. 30 g 0  . potassium chloride SA (K-DUR,KLOR-CON) 20 MEQ tablet Take 1 tablet (20 mEq total) by mouth 2  (two) times daily. 60 tablet 0  . torsemide (DEMADEX) 20 MG tablet TAKE 4 TABLETS (80MG) BY MOUTH IN THE MORNING AND EACH EVENING DAILY. 240 tablet 7  . vitamin B-12 (CYANOCOBALAMIN) 500 MCG tablet Take 500 mcg by mouth daily.    . white petrolatum (VASELINE) GEL Apply  1 application topically daily as needed for dry skin.    . calcitRIOL (ROCALTROL) 0.25 MCG capsule Take 0.25 mcg by mouth every other day.      No current facility-administered medications for this encounter.     Vitals:   07/15/18 1027  BP: 126/74  Temp: (!) 97 F (36.1 C)  SpO2: 96%  Weight: 110.7 kg (244 lb)   Wt Readings from Last 3 Encounters:  07/15/18 110.7 kg (244 lb)  07/02/18 110.9 kg (244 lb 9.6 oz)  05/30/18 116.6 kg (257 lb)    PHYSICAL EXAM: General:  Appears chronically ill.  No resp difficulty. Arrived in a wheel chair.  HEENT: normal Neck: supple. JVP CV waves 7-8. Carotids 2+ bilaterally; no bruits. No lymphadenopathy or thryomegaly appreciated. Cor: PMI normal. Regular rate & rhythm. No rubs, gallops. 3/6 murmur LSB. Lungs: clear Abdomen: obese, soft, nontender, nondistended. No hepatosplenomegaly. No bruits or masses. Good bowel sounds. Extremities: no cyanosis, clubbing, rash, edema. Chronic hyperpigmetation on lower extremities. Neuro: alert & orientedx3, cranial nerves grossly intact. Moves all 4 extremities w/o difficulty. Affect pleasant. Skin: red macular rash on truck  ASSESSMENT & PLAN: 1. Chronic combined HF: Echo 06/21/18 with EF 40-45%, LV severely dilated, mild LVH, akinesis of mid-apicalanteroseptal and apical myocardium, mild AS, severe MR, LA and RA severely dilated, moderate TR, PA peak pressure 42 mmHg.  Cause of cardiomyopathy uncertain.  Could be valvular, could be due to chronic RV pacing (has not had coronary angiography that I can see and now with CKD stage IV).  [place] 10/4 showed preserved cardiac output with markedly elevated right and left heart filling pressures (very  prominent RV failure).  TEE looked like restrictive cardiomyopathy.   NYHA IIIb.  -Volume status stable. Continue torsemide 80 mg twice a day.  Take an additional 20 mg torsemide for weight 246 or greater.  - She was taken off hydralazine due to soft BP, Imdur continues.  - No spiro/ARB/ACEI/ARNI with CKD stage IV.  -Check BMET today  2. Mitral regurgitation: Severe MR on echo9/27/19 (mild on echo 10/2016).  TEE on 10/4 showed moderate-severe central MR with some restriction of posterior leaflet.  Tricuspid regurgitation was more impressive.  Will review with structural heart team regarding benefit from Mitraclip.  It is possible that decreasing the MR may help mitigate her right heart failure.  - She was evaluated by Dr Burt Knack.  3. Afib s/p AV nodal ablation and PPM: Chronic RV pacing in the 90s.   - With dyssynchrony and decreased LV EF, could consider CRT upgrade at some point but doubt that will improve current situation - She refuses anticoagulation ("caused my teeth to fall out and my hair to fall out").  She will only take ASA.  4. OSA: Wears BiPAP qHS 5. CKD Stage 4: Baseline creatinine around 2.5.  Creatinine up to 2.5   She has follow up with Dr Jimmy Footman.  6. Tricuspid regurgitation: Severe on TEE.   Follow up in 6 weeks with Dr Vaughan Browner.

## 2018-07-17 ENCOUNTER — Telehealth: Payer: Self-pay | Admitting: Gastroenterology

## 2018-07-17 NOTE — Telephone Encounter (Signed)
Letter mailed

## 2018-07-17 NOTE — Telephone Encounter (Signed)
RECALL FOR ULTRASOUND 

## 2018-07-19 ENCOUNTER — Other Ambulatory Visit (HOSPITAL_COMMUNITY): Payer: Self-pay | Admitting: Internal Medicine

## 2018-07-19 DIAGNOSIS — N184 Chronic kidney disease, stage 4 (severe): Secondary | ICD-10-CM | POA: Diagnosis not present

## 2018-07-25 ENCOUNTER — Telehealth: Payer: Self-pay

## 2018-07-25 DIAGNOSIS — K7581 Nonalcoholic steatohepatitis (NASH): Principal | ICD-10-CM

## 2018-07-25 DIAGNOSIS — K7460 Unspecified cirrhosis of liver: Secondary | ICD-10-CM

## 2018-07-25 NOTE — Telephone Encounter (Signed)
[place] scheduled for 09/03/18 at [facility]. Called to inform pt of appt. She then states she wants US done at [place]. Informed her order would be sent to [place] and they will contact her to schedule. [place] order and face sheet faxed to [place].

## 2018-07-25 NOTE — Telephone Encounter (Signed)
Pt called office to schedule [place] abd RUQ for December.

## 2018-07-26 ENCOUNTER — Telehealth: Payer: Self-pay | Admitting: Cardiovascular Disease

## 2018-07-26 DIAGNOSIS — R609 Edema, unspecified: Secondary | ICD-10-CM | POA: Diagnosis not present

## 2018-07-26 DIAGNOSIS — Z79899 Other long term (current) drug therapy: Secondary | ICD-10-CM | POA: Diagnosis not present

## 2018-07-26 DIAGNOSIS — Z23 Encounter for immunization: Secondary | ICD-10-CM | POA: Diagnosis not present

## 2018-07-26 DIAGNOSIS — I10 Essential (primary) hypertension: Secondary | ICD-10-CM | POA: Diagnosis not present

## 2018-07-26 DIAGNOSIS — I509 Heart failure, unspecified: Secondary | ICD-10-CM | POA: Diagnosis not present

## 2018-07-26 DIAGNOSIS — E7800 Pure hypercholesterolemia, unspecified: Secondary | ICD-10-CM | POA: Diagnosis not present

## 2018-07-26 DIAGNOSIS — N183 Chronic kidney disease, stage 3 (moderate): Secondary | ICD-10-CM | POA: Diagnosis not present

## 2018-07-26 DIAGNOSIS — M170 Bilateral primary osteoarthritis of knee: Secondary | ICD-10-CM | POA: Diagnosis not present

## 2018-07-26 DIAGNOSIS — K7460 Unspecified cirrhosis of liver: Secondary | ICD-10-CM | POA: Diagnosis not present

## 2018-07-26 DIAGNOSIS — E876 Hypokalemia: Secondary | ICD-10-CM | POA: Diagnosis not present

## 2018-07-26 DIAGNOSIS — M109 Gout, unspecified: Secondary | ICD-10-CM | POA: Diagnosis not present

## 2018-07-26 NOTE — Telephone Encounter (Signed)
Will forward to Dr Harrington Challenger and see if she needs to continue to see pt in f/u .

## 2018-07-26 NOTE — Telephone Encounter (Signed)
  Patient is not sure if she is supposed to continue being seen here. She sees Dr Haroldine Laws and Dr. Burt Knack and wants to know if she needs to make an appt with Dr Harrington Challenger still.

## 2018-07-29 ENCOUNTER — Ambulatory Visit: Payer: Medicare Other | Admitting: Internal Medicine

## 2018-07-29 DIAGNOSIS — Z87440 Personal history of urinary (tract) infections: Secondary | ICD-10-CM | POA: Diagnosis not present

## 2018-07-29 DIAGNOSIS — G4733 Obstructive sleep apnea (adult) (pediatric): Secondary | ICD-10-CM | POA: Diagnosis not present

## 2018-07-29 DIAGNOSIS — N2581 Secondary hyperparathyroidism of renal origin: Secondary | ICD-10-CM | POA: Diagnosis not present

## 2018-07-29 DIAGNOSIS — N189 Chronic kidney disease, unspecified: Secondary | ICD-10-CM | POA: Diagnosis not present

## 2018-07-29 DIAGNOSIS — D631 Anemia in chronic kidney disease: Secondary | ICD-10-CM | POA: Diagnosis not present

## 2018-07-29 DIAGNOSIS — Z950 Presence of cardiac pacemaker: Secondary | ICD-10-CM | POA: Diagnosis not present

## 2018-07-29 DIAGNOSIS — N200 Calculus of kidney: Secondary | ICD-10-CM | POA: Diagnosis not present

## 2018-07-29 DIAGNOSIS — I129 Hypertensive chronic kidney disease with stage 1 through stage 4 chronic kidney disease, or unspecified chronic kidney disease: Secondary | ICD-10-CM | POA: Diagnosis not present

## 2018-07-29 DIAGNOSIS — M109 Gout, unspecified: Secondary | ICD-10-CM | POA: Diagnosis not present

## 2018-07-29 DIAGNOSIS — I480 Paroxysmal atrial fibrillation: Secondary | ICD-10-CM | POA: Diagnosis not present

## 2018-07-29 DIAGNOSIS — N390 Urinary tract infection, site not specified: Secondary | ICD-10-CM | POA: Diagnosis not present

## 2018-07-29 DIAGNOSIS — I5042 Chronic combined systolic (congestive) and diastolic (congestive) heart failure: Secondary | ICD-10-CM | POA: Diagnosis not present

## 2018-07-29 DIAGNOSIS — N184 Chronic kidney disease, stage 4 (severe): Secondary | ICD-10-CM | POA: Diagnosis not present

## 2018-07-31 NOTE — Telephone Encounter (Signed)
Left detailed message (DPR) that since she is now established with [facility] and has f/u scheduled with Dr. Haroldine Laws that she will continue to follow with him for now and no appt with Dr. Harrington Challenger is needed at this time.

## 2018-08-26 DIAGNOSIS — I129 Hypertensive chronic kidney disease with stage 1 through stage 4 chronic kidney disease, or unspecified chronic kidney disease: Secondary | ICD-10-CM | POA: Diagnosis not present

## 2018-08-26 DIAGNOSIS — M109 Gout, unspecified: Secondary | ICD-10-CM | POA: Diagnosis not present

## 2018-08-26 DIAGNOSIS — N200 Calculus of kidney: Secondary | ICD-10-CM | POA: Diagnosis not present

## 2018-08-26 DIAGNOSIS — N184 Chronic kidney disease, stage 4 (severe): Secondary | ICD-10-CM | POA: Diagnosis not present

## 2018-08-26 DIAGNOSIS — I5042 Chronic combined systolic (congestive) and diastolic (congestive) heart failure: Secondary | ICD-10-CM | POA: Diagnosis not present

## 2018-08-26 DIAGNOSIS — M1990 Unspecified osteoarthritis, unspecified site: Secondary | ICD-10-CM | POA: Diagnosis not present

## 2018-08-26 DIAGNOSIS — Z87440 Personal history of urinary (tract) infections: Secondary | ICD-10-CM | POA: Diagnosis not present

## 2018-08-26 DIAGNOSIS — G4733 Obstructive sleep apnea (adult) (pediatric): Secondary | ICD-10-CM | POA: Diagnosis not present

## 2018-08-26 DIAGNOSIS — I480 Paroxysmal atrial fibrillation: Secondary | ICD-10-CM | POA: Diagnosis not present

## 2018-08-26 DIAGNOSIS — D631 Anemia in chronic kidney disease: Secondary | ICD-10-CM | POA: Diagnosis not present

## 2018-08-26 DIAGNOSIS — N2581 Secondary hyperparathyroidism of renal origin: Secondary | ICD-10-CM | POA: Diagnosis not present

## 2018-08-26 DIAGNOSIS — Z950 Presence of cardiac pacemaker: Secondary | ICD-10-CM | POA: Diagnosis not present

## 2018-08-29 DIAGNOSIS — K7460 Unspecified cirrhosis of liver: Secondary | ICD-10-CM | POA: Diagnosis not present

## 2018-08-29 DIAGNOSIS — K7581 Nonalcoholic steatohepatitis (NASH): Secondary | ICD-10-CM | POA: Diagnosis not present

## 2018-09-03 ENCOUNTER — Ambulatory Visit (HOSPITAL_COMMUNITY): Payer: Medicare Other

## 2018-09-16 ENCOUNTER — Telehealth (HOSPITAL_COMMUNITY): Payer: Self-pay | Admitting: Cardiology

## 2018-09-16 ENCOUNTER — Encounter (HOSPITAL_COMMUNITY): Payer: Self-pay | Admitting: Internal Medicine

## 2018-09-16 ENCOUNTER — Ambulatory Visit (HOSPITAL_COMMUNITY)
Admission: RE | Admit: 2018-09-16 | Discharge: 2018-09-16 | Disposition: A | Discharge status: Home / Self Care | Payer: Medicare Other | Source: Ambulatory Visit | Attending: Internal Medicine | Admitting: Internal Medicine

## 2018-09-16 VITALS — BP 104/60 | HR 111 | Wt 240.0 lb

## 2018-09-16 DIAGNOSIS — Z823 Family history of stroke: Secondary | ICD-10-CM | POA: Diagnosis not present

## 2018-09-16 DIAGNOSIS — G4733 Obstructive sleep apnea (adult) (pediatric): Secondary | ICD-10-CM | POA: Diagnosis not present

## 2018-09-16 DIAGNOSIS — D509 Iron deficiency anemia, unspecified: Secondary | ICD-10-CM | POA: Diagnosis not present

## 2018-09-16 DIAGNOSIS — Z950 Presence of cardiac pacemaker: Secondary | ICD-10-CM | POA: Insufficient documentation

## 2018-09-16 DIAGNOSIS — N184 Chronic kidney disease, stage 4 (severe): Secondary | ICD-10-CM | POA: Insufficient documentation

## 2018-09-16 DIAGNOSIS — Z8541 Personal history of malignant neoplasm of cervix uteri: Secondary | ICD-10-CM | POA: Insufficient documentation

## 2018-09-16 DIAGNOSIS — N1832 Chronic kidney disease, stage 3b: Secondary | ICD-10-CM

## 2018-09-16 DIAGNOSIS — Z8249 Family history of ischemic heart disease and other diseases of the circulatory system: Secondary | ICD-10-CM | POA: Insufficient documentation

## 2018-09-16 DIAGNOSIS — Z7982 Long term (current) use of aspirin: Secondary | ICD-10-CM | POA: Insufficient documentation

## 2018-09-16 DIAGNOSIS — N183 Chronic kidney disease, stage 3 (moderate): Secondary | ICD-10-CM | POA: Diagnosis not present

## 2018-09-16 DIAGNOSIS — I340 Nonrheumatic mitral (valve) insufficiency: Secondary | ICD-10-CM

## 2018-09-16 DIAGNOSIS — Z79899 Other long term (current) drug therapy: Secondary | ICD-10-CM | POA: Diagnosis not present

## 2018-09-16 DIAGNOSIS — I130 Hypertensive heart and chronic kidney disease with heart failure and stage 1 through stage 4 chronic kidney disease, or unspecified chronic kidney disease: Secondary | ICD-10-CM | POA: Insufficient documentation

## 2018-09-16 DIAGNOSIS — Z90710 Acquired absence of both cervix and uterus: Secondary | ICD-10-CM | POA: Insufficient documentation

## 2018-09-16 DIAGNOSIS — Z833 Family history of diabetes mellitus: Secondary | ICD-10-CM | POA: Diagnosis not present

## 2018-09-16 DIAGNOSIS — I5042 Chronic combined systolic (congestive) and diastolic (congestive) heart failure: Secondary | ICD-10-CM | POA: Diagnosis not present

## 2018-09-16 DIAGNOSIS — I4821 Permanent atrial fibrillation: Secondary | ICD-10-CM | POA: Insufficient documentation

## 2018-09-16 DIAGNOSIS — Z87891 Personal history of nicotine dependence: Secondary | ICD-10-CM | POA: Insufficient documentation

## 2018-09-16 DIAGNOSIS — I272 Pulmonary hypertension, unspecified: Secondary | ICD-10-CM | POA: Diagnosis not present

## 2018-09-16 DIAGNOSIS — I5022 Chronic systolic (congestive) heart failure: Secondary | ICD-10-CM | POA: Diagnosis not present

## 2018-09-16 DIAGNOSIS — I081 Rheumatic disorders of both mitral and tricuspid valves: Secondary | ICD-10-CM | POA: Insufficient documentation

## 2018-09-16 LAB — BASIC METABOLIC PANEL
Anion gap: 12 (ref 5–15)
BUN: 157 mg/dL — AB (ref 8–23)
CALCIUM: 9.5 mg/dL (ref 8.9–10.3)
CO2: 34 mmol/L — ABNORMAL HIGH (ref 22–32)
CREATININE: 3.13 mg/dL — AB (ref 0.44–1.00)
Chloride: 90 mmol/L — ABNORMAL LOW (ref 98–111)
GFR calc non Af Amer: 14 mL/min — ABNORMAL LOW (ref >60)
GFR, EST AFRICAN AMERICAN: 16 mL/min — AB (ref >60)
Glucose, Bld: 129 mg/dL — ABNORMAL HIGH (ref 70–99)
Potassium: 2.7 mmol/L — CL (ref 3.5–5.1)
Sodium: 136 mmol/L (ref 135–145)

## 2018-09-16 NOTE — Telephone Encounter (Signed)
Critical results from [facility] labs K 2.7  Results given to Dr Haroldine Laws Per VO Dr Haroldine Laws Hold torsemide x 3 days, resume normal dose 12/26 Stop metolazone Add additional 80 meQ K today, then resume normal dose 12/24 Repeat bmet end of the week   [facility] for patient to return call

## 2018-09-16 NOTE — Patient Instructions (Signed)
Labs done today  Your physician has requested that you have an echocardiogram. Echocardiography is a painless test that uses sound waves to create images of your heart. It provides your doctor with information about the size and shape of your heart and how well your heart's chambers and valves are working. This procedure takes approximately one hour. There are no restrictions for this procedure.  Follow up with Dr. Haroldine Laws in 4 months

## 2018-09-16 NOTE — Progress Notes (Signed)
PCP: Dr Lauretta Grill  Primary Cardiologist: Dr Harrington Challenger  Structural Heart: Dr Burt Knack EP: Dr Lovena Le Nephrology: Dr Deterding  HPI: [name] [name] a 72 y.o.femalewith a history of morbid obesity, afib s/p AV ablation andSt JudePPM, chronic combined HF, CKD IV, severe MR, TR, pulmonary HTN, OSA on BiPAP, GERD, gout, and iron deficiency anemia. Cardiac cath 2009 with normal coronary arteries   Admitted 06/20/18 with CP and SOB.Echo was completed and showed EF 40-45% with severe MR/TR. Cause of cardiomyopathy uncertain.  Could be valvular, could be due to chronic RV pacing . Underwent RHC and TEE (see below). Diuresed with IV lasix and transistioned to torsemide 80 mg twice a day. Discharge weight 244.6 pounds.   She saw Dr Burt Knack on 10/16 for possible mitral clip. There was concern mitral clip may not make a difference due to multiple co-morbidities including RV failure with severe TR.   She returns for routine f/u with her daughter. Overall doing fairly well. Taking torsemide 80 bid and then metolazone every Thursday. Which was adjusted by Deterding when she him last month. Breathing better but gets winded with any exertion. Uses BIPAP at night. No orthopnea or PND. Weight stable at 240-245. Taking all medications. Lives with her 2 daughters. Occasional stabbing CP.   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   Echo (06/21/2018): EF 40-45% Moderate TR, Severe MR   ROS: All systems negative except as listed in HPI, PMH and Problem List.  SH:  Social History   Socioeconomic History  . Marital status: Widowed    Spouse name: Not on file  . Number of children: 1  . Years of education: Not on file  . Highest education  level: Some college, no degree  Occupational History  . Not on file  Social Needs  . Financial resource strain: Not hard at all  . Food insecurity:    Worry: Never true    Inability: Never true  . Transportation needs:    Medical: No    Non-medical: No  Tobacco Use  . Smoking status: Former Smoker    Packs/day: 1.00    Types: Cigarettes    Last attempt to quit: 09/26/1979    Years since quitting: 39.0  . Smokeless tobacco: Never Used  Substance and Sexual Activity  . Alcohol use: No  . Drug use: No  . Sexual activity: Never  Lifestyle  . Physical activity:    Days per week: Not on file    Minutes per session: Not on file  . Stress: Not on file  Relationships  . Social connections:    Talks on phone: Not on file    Gets together: Not on file    Attends religious service: Not on file    Active member of club or organization: Not on file    Attends meetings of clubs or organizations: Not on file    Relationship status: Not on file  . Intimate partner violence:    Fear of current or ex partner: Not on file    Emotionally abused: Not on file    Physically abused: Not on file    Forced sexual activity: Not on file  Other Topics Concern  . Not  on file  Social History Narrative  . Not on file    FH:  Family History  Problem Relation Age of Onset  . Heart attack Father   . Stroke Mother   . Diabetes Sister   . Colon cancer Other        seven family members  . Liver disease Neg Hx   . Other Neg Hx     Past Medical History:  Diagnosis Date  . Anemia   . Atrial fibrillation ([place])   . Cataract   . Cervical cancer ([place]) 1991   s/p hysterectomy  . Chronic cellulitis   . Chronic combined systolic and diastolic congestive heart failure, NYHA class 2 (HCC)    LVEF 25-30% with restrictive diastolic filling  . Degenerative joint disease   . Diverticulitis   . Essential hypertension   . GERD (gastroesophageal reflux disease)   . Gout   . Kidney stones   . Morbid  obesity ([place])   . NICM (nonischemic cardiomyopathy) ([place]) 02/22/2017  . Osteoarthritis   . Permanent atrial fibrillation 05/04/2009   Qualifier: History of  By: Quentin Cornwall CMA, Janett Billow    . 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 Sinoatrial node dysfunction [facility])    Status post PPM - Dr. Lovena Le  . Sleep apnea    CPAP    Current Outpatient Medications  Medication Sig Dispense Refill  . allopurinol (ZYLOPRIM) 100 MG tablet Take 300 mg by mouth daily.     Marland Kitchen aspirin 325 MG tablet Take 325 mg by mouth every evening.     . calcitRIOL (ROCALTROL) 0.25 MCG capsule Take 0.25 mcg by mouth every other day.     . calcium carbonate (OS-CAL) 600 MG tablet Take 600 mg by mouth daily.    . COD LIVER OIL PO Take 1 tablet by mouth daily.      Marland Kitchen docusate sodium (COLACE) 100 MG capsule Take 200 mg by mouth daily as needed for mild constipation.     . feeding supplement (BOOST HIGH PROTEIN) LIQD Take 1 Container by mouth daily.    . ferrous sulfate 325 (65 FE) MG tablet Take 325 mg by mouth every evening.     . Glycerin, Adult, 2.1 g SUPP Place 1 suppository rectally daily as needed for moderate constipation.  0  . HYDROcodone-acetaminophen (NORCO) 10-325 MG per tablet Take 1 tablet by mouth 3 (three) times daily.     . isosorbide mononitrate (IMDUR) 30 MG 24 hr tablet Take 0.5 tablets (15 mg total) by mouth daily. 90 tablet 2  . metolazone (ZAROXOLYN) 2.5 MG tablet Take 2.5 mg by mouth once a week. Take on Thursday    . Multiple Vitamin (MULTIVITAMIN) tablet Take 1 tablet by mouth daily.    Marland Kitchen nystatin (MYCOSTATIN/NYSTOP) powder Apply 1 application topically daily.     Marland Kitchen nystatin-triamcinolone ointment (MYCOLOG) Apply topically 2 (two) times daily. 30 g 0  . potassium chloride SA (K-DUR,KLOR-CON) 20 MEQ tablet Take 1 tablet (20 mEq total) by mouth 2 (two) times daily. 60 tablet 0  . torsemide (DEMADEX) 20 MG tablet Take 4 tablets (80 mg  total) by mouth 2 (two) times daily. May also take 1 tablet (20 mg total) as needed (for weight greater than 246). 270 tablet 7  . vitamin B-12 (CYANOCOBALAMIN) 500 MCG tablet Take 500 mcg by mouth daily.    . white petrolatum (VASELINE) GEL Apply 1 application topically  daily as needed for dry skin.     No current facility-administered medications for this encounter.     Vitals:   09/16/18 1129  BP: 104/60  Pulse: (!) 111  SpO2: 98%  Weight: 108.9 kg (240 lb)   Wt Readings from Last 3 Encounters:  09/16/18 108.9 kg (240 lb)  07/15/18 110.7 kg (244 lb)  07/02/18 110.9 kg (244 lb 9.6 oz)    PHYSICAL EXAM: General:  Elderly obese woman sitting in WC. No resp difficulty HEENT: normal x poor dentition Neck: supple. JVP 7. Carotids 2+ bilat; no bruits. No lymphadenopathy or thryomegaly appreciated. Cor: PMI nondisplaced. Regular rate & rhythm. 2/6 AS 2/6 TR 3/6 MR Lungs: clear Abdomen: obese soft, nontender, nondistended. No hepatosplenomegaly. No bruits or masses. Good bowel sounds. Extremities: no cyanosis, clubbing, rash, no edema. Chronic venous stasis changes Neuro: alert & orientedx3, cranial nerves grossly intact. moves all 4 extremities w/o difficulty. Affect pleasant  ASSESSMENT & PLAN: 1. Chronic combined HF: Echo 06/21/18 with EF 40-45%, LV severely dilated, mild LVH, akinesis of mid-apicalanteroseptal and apical myocardium, mild AS, severe MR, LA and RA severely dilated, moderate TR, PA peak pressure 42 mmHg.  Cause of cardiomyopathy uncertain.  Could be valvular, could be due to chronic RV pacing (has not had coronary angiography that I can see and now with CKD stage IV).  [place] 10/4 showed preserved cardiac output with markedly elevated right and left heart filling pressures (very prominent RV failure).  TEE looked like restrictive cardiomyopathy.   - Remains NYHA III-IIIb.  - Volume status looks great. Continue torsemide 80 mg twice a day and metolazone every Thursday.    -  She was taken off hydralazine due to soft BP, Imdur continues.  - No spiro/ARB/ACEI/ARNI with CKD stage IV.  - Check BMET today  - Echo at next visit 2. Mitral regurgitation: Severe MR on echo9/27/19 (mild on echo 10/2016).  TEE on 10/4 showed moderate-severe central MR with some restriction of posterior leaflet.  Tricuspid regurgitation was more impressive.   - She was evaluated by Dr Burt Knack on 10/16 for possible mitral clip. There was concern mitral clip may not make a difference due to multiple co-morbidities including RV failure with severe TR.  - continue medical therapy 3. Afib s/p AV nodal ablation and PPM: Chronic RV pacing in the 90s.   - With dyssynchrony and decreased LV EF, could consider CRT upgrade at some point but doubt that will improve current situation - She refuses anticoagulation ("caused my teeth to fall out and my hair to fall out").  She will only take ASA.  4. OSA: Wears BiPAP qHS 5. CKD Stage 4: Baseline creatinine around 2.5.  - last creatinine was 2.8 on 07/10/18  - She has follow up with Dr Jimmy Footman in Nephrology - recheck BMET today 6. Tricuspid regurgitation:  - Severe on TEE.  7. Mild aortic stenosis - stable. Repeat echo next visit 8. Deconditioning - has completed HHPT. Suggested outpatient PT if she can get transportation  Glori Bickers, MD  12:07 PM

## 2018-09-16 NOTE — Addendum Note (Signed)
Encounter addended by: Marlise Eves, RN on: 09/16/2018 12:51 PM  Actions taken: Order list changed, Diagnosis association updated, Charge Capture section accepted, Clinical Note Signed

## 2018-09-17 MED ORDER — POTASSIUM CHLORIDE CRYS ER 20 MEQ PO TBCR: 20.0000 meq | EXTENDED_RELEASE_TABLET | Freq: Two times a day (BID) | ORAL | 3 refills | Status: DC

## 2018-09-17 NOTE — Telephone Encounter (Signed)
Pt aware and voiced understanding Repeat lab order faxed to [facility] in [place] Fax # [phone]

## 2018-09-20 ENCOUNTER — Other Ambulatory Visit: Payer: Self-pay | Admitting: Internal Medicine

## 2018-09-20 DIAGNOSIS — I5022 Chronic systolic (congestive) heart failure: Secondary | ICD-10-CM | POA: Diagnosis not present

## 2018-09-21 LAB — BASIC METABOLIC PANEL
BUN/Creatinine Ratio: 45 — ABNORMAL HIGH (ref 12–28)
BUN: 143 mg/dL (ref 8–27)
CO2: 26 mmol/L (ref 20–29)
Calcium: 10 mg/dL (ref 8.7–10.3)
Chloride: 90 mmol/L — ABNORMAL LOW (ref 96–106)
Creatinine, Ser: 3.17 mg/dL — ABNORMAL HIGH (ref 0.57–1.00)
GFR calc Af Amer: 16 mL/min/{1.73_m2} — ABNORMAL LOW (ref >59)
GFR calc non Af Amer: 14 mL/min/{1.73_m2} — ABNORMAL LOW (ref >59)
Glucose: 164 mg/dL — ABNORMAL HIGH (ref 65–99)
POTASSIUM: 5.8 mmol/L — AB (ref 3.5–5.2)
Sodium: 135 mmol/L (ref 134–144)

## 2018-09-23 ENCOUNTER — Encounter (HOSPITAL_COMMUNITY): Payer: Self-pay

## 2018-09-23 ENCOUNTER — Telehealth (HOSPITAL_COMMUNITY): Payer: Self-pay | Admitting: Cardiology

## 2018-09-23 ENCOUNTER — Inpatient Hospital Stay (HOSPITAL_COMMUNITY)
Admission: EM | Admit: 2018-09-23 | Discharge: 2018-09-29 | DRG: 682 | Disposition: A | Discharge status: Home / Self Care | Payer: Medicare Other | Attending: Internal Medicine | Admitting: Internal Medicine

## 2018-09-23 ENCOUNTER — Other Ambulatory Visit: Payer: Self-pay

## 2018-09-23 ENCOUNTER — Emergency Department (HOSPITAL_COMMUNITY): Payer: Medicare Other

## 2018-09-23 DIAGNOSIS — I081 Rheumatic disorders of both mitral and tricuspid valves: Secondary | ICD-10-CM | POA: Diagnosis present

## 2018-09-23 DIAGNOSIS — I130 Hypertensive heart and chronic kidney disease with heart failure and stage 1 through stage 4 chronic kidney disease, or unspecified chronic kidney disease: Secondary | ICD-10-CM | POA: Diagnosis present

## 2018-09-23 DIAGNOSIS — K2960 Other gastritis without bleeding: Secondary | ICD-10-CM | POA: Diagnosis present

## 2018-09-23 DIAGNOSIS — Z888 Allergy status to other drugs, medicaments and biological substances status: Secondary | ICD-10-CM

## 2018-09-23 DIAGNOSIS — Z880 Allergy status to penicillin: Secondary | ICD-10-CM

## 2018-09-23 DIAGNOSIS — E875 Hyperkalemia: Secondary | ICD-10-CM | POA: Diagnosis present

## 2018-09-23 DIAGNOSIS — Z79899 Other long term (current) drug therapy: Secondary | ICD-10-CM

## 2018-09-23 DIAGNOSIS — Z91040 Latex allergy status: Secondary | ICD-10-CM

## 2018-09-23 DIAGNOSIS — K254 Chronic or unspecified gastric ulcer with hemorrhage: Secondary | ICD-10-CM | POA: Diagnosis not present

## 2018-09-23 DIAGNOSIS — G4733 Obstructive sleep apnea (adult) (pediatric): Secondary | ICD-10-CM | POA: Diagnosis present

## 2018-09-23 DIAGNOSIS — Z8541 Personal history of malignant neoplasm of cervix uteri: Secondary | ICD-10-CM

## 2018-09-23 DIAGNOSIS — D649 Anemia, unspecified: Secondary | ICD-10-CM | POA: Diagnosis not present

## 2018-09-23 DIAGNOSIS — E872 Acidosis: Secondary | ICD-10-CM | POA: Diagnosis not present

## 2018-09-23 DIAGNOSIS — I5042 Chronic combined systolic (congestive) and diastolic (congestive) heart failure: Secondary | ICD-10-CM | POA: Diagnosis present

## 2018-09-23 DIAGNOSIS — Z6839 Body mass index (BMI) 39.0-39.9, adult: Secondary | ICD-10-CM

## 2018-09-23 DIAGNOSIS — R195 Other fecal abnormalities: Secondary | ICD-10-CM | POA: Diagnosis not present

## 2018-09-23 DIAGNOSIS — K3189 Other diseases of stomach and duodenum: Secondary | ICD-10-CM | POA: Diagnosis present

## 2018-09-23 DIAGNOSIS — J90 Pleural effusion, not elsewhere classified: Secondary | ICD-10-CM | POA: Diagnosis not present

## 2018-09-23 DIAGNOSIS — D539 Nutritional anemia, unspecified: Secondary | ICD-10-CM | POA: Diagnosis present

## 2018-09-23 DIAGNOSIS — I4821 Permanent atrial fibrillation: Secondary | ICD-10-CM | POA: Diagnosis present

## 2018-09-23 DIAGNOSIS — K2990 Gastroduodenitis, unspecified, without bleeding: Secondary | ICD-10-CM | POA: Diagnosis not present

## 2018-09-23 DIAGNOSIS — N179 Acute kidney failure, unspecified: Principal | ICD-10-CM | POA: Diagnosis present

## 2018-09-23 DIAGNOSIS — D62 Acute posthemorrhagic anemia: Secondary | ICD-10-CM | POA: Diagnosis present

## 2018-09-23 DIAGNOSIS — E861 Hypovolemia: Secondary | ICD-10-CM | POA: Diagnosis present

## 2018-09-23 DIAGNOSIS — K7460 Unspecified cirrhosis of liver: Secondary | ICD-10-CM | POA: Diagnosis present

## 2018-09-23 DIAGNOSIS — D500 Iron deficiency anemia secondary to blood loss (chronic): Secondary | ICD-10-CM | POA: Diagnosis not present

## 2018-09-23 DIAGNOSIS — E876 Hypokalemia: Secondary | ICD-10-CM | POA: Diagnosis not present

## 2018-09-23 DIAGNOSIS — D696 Thrombocytopenia, unspecified: Secondary | ICD-10-CM | POA: Diagnosis present

## 2018-09-23 DIAGNOSIS — I2729 Other secondary pulmonary hypertension: Secondary | ICD-10-CM | POA: Diagnosis present

## 2018-09-23 DIAGNOSIS — K7581 Nonalcoholic steatohepatitis (NASH): Secondary | ICD-10-CM | POA: Diagnosis present

## 2018-09-23 DIAGNOSIS — Z7982 Long term (current) use of aspirin: Secondary | ICD-10-CM

## 2018-09-23 DIAGNOSIS — E871 Hypo-osmolality and hyponatremia: Secondary | ICD-10-CM | POA: Diagnosis not present

## 2018-09-23 DIAGNOSIS — N19 Unspecified kidney failure: Secondary | ICD-10-CM | POA: Diagnosis not present

## 2018-09-23 DIAGNOSIS — K922 Gastrointestinal hemorrhage, unspecified: Secondary | ICD-10-CM | POA: Diagnosis not present

## 2018-09-23 DIAGNOSIS — K2970 Gastritis, unspecified, without bleeding: Secondary | ICD-10-CM | POA: Diagnosis not present

## 2018-09-23 DIAGNOSIS — I959 Hypotension, unspecified: Secondary | ICD-10-CM | POA: Diagnosis not present

## 2018-09-23 DIAGNOSIS — R040 Epistaxis: Secondary | ICD-10-CM | POA: Diagnosis not present

## 2018-09-23 DIAGNOSIS — N184 Chronic kidney disease, stage 4 (severe): Secondary | ICD-10-CM | POA: Diagnosis present

## 2018-09-23 DIAGNOSIS — D631 Anemia in chronic kidney disease: Secondary | ICD-10-CM | POA: Diagnosis present

## 2018-09-23 DIAGNOSIS — D509 Iron deficiency anemia, unspecified: Secondary | ICD-10-CM | POA: Diagnosis present

## 2018-09-23 DIAGNOSIS — E860 Dehydration: Secondary | ICD-10-CM | POA: Diagnosis present

## 2018-09-23 DIAGNOSIS — I428 Other cardiomyopathies: Secondary | ICD-10-CM | POA: Diagnosis present

## 2018-09-23 DIAGNOSIS — M109 Gout, unspecified: Secondary | ICD-10-CM | POA: Diagnosis present

## 2018-09-23 DIAGNOSIS — I425 Other restrictive cardiomyopathy: Secondary | ICD-10-CM | POA: Diagnosis present

## 2018-09-23 DIAGNOSIS — Z452 Encounter for adjustment and management of vascular access device: Secondary | ICD-10-CM

## 2018-09-23 DIAGNOSIS — Z87891 Personal history of nicotine dependence: Secondary | ICD-10-CM

## 2018-09-23 DIAGNOSIS — K259 Gastric ulcer, unspecified as acute or chronic, without hemorrhage or perforation: Secondary | ICD-10-CM | POA: Diagnosis not present

## 2018-09-23 DIAGNOSIS — J811 Chronic pulmonary edema: Secondary | ICD-10-CM | POA: Diagnosis not present

## 2018-09-23 DIAGNOSIS — Z885 Allergy status to narcotic agent status: Secondary | ICD-10-CM

## 2018-09-23 DIAGNOSIS — Z950 Presence of cardiac pacemaker: Secondary | ICD-10-CM

## 2018-09-23 DIAGNOSIS — Z881 Allergy status to other antibiotic agents status: Secondary | ICD-10-CM

## 2018-09-23 DIAGNOSIS — I5082 Biventricular heart failure: Secondary | ICD-10-CM | POA: Diagnosis present

## 2018-09-23 DIAGNOSIS — K2950 Unspecified chronic gastritis without bleeding: Secondary | ICD-10-CM | POA: Diagnosis not present

## 2018-09-23 LAB — I-STAT VENOUS BLOOD GAS, ED
Acid-Base Excess: 1 mmol/L (ref 0.0–2.0)
Bicarbonate: 26.7 mmol/L (ref 20.0–28.0)
O2 Saturation: 31 %
TCO2: 28 mmol/L (ref 22–32)
pCO2, Ven: 47 mmHg (ref 44.0–60.0)
pH, Ven: 7.363 (ref 7.250–7.430)
pO2, Ven: 21 mmHg — CL (ref 32.0–45.0)

## 2018-09-23 LAB — I-STAT CHEM 8, ED
BUN: >140 mg/dL — ABNORMAL HIGH (ref 8–23)
CALCIUM ION: 1.25 mmol/L (ref 1.15–1.40)
Chloride: 96 mmol/L — ABNORMAL LOW (ref 98–111)
Creatinine, Ser: 4.4 mg/dL — ABNORMAL HIGH (ref 0.44–1.00)
GLUCOSE: 115 mg/dL — AB (ref 70–99)
HCT: 18 % — ABNORMAL LOW (ref 36.0–46.0)
HEMOGLOBIN: 6.1 g/dL — AB (ref 12.0–15.0)
POTASSIUM: 7.1 mmol/L — AB (ref 3.5–5.1)
Sodium: 128 mmol/L — ABNORMAL LOW (ref 135–145)
TCO2: 25 mmol/L (ref 22–32)

## 2018-09-23 LAB — CBC WITH DIFFERENTIAL/PLATELET
Abs Immature Granulocytes: 0.03 K/uL (ref 0.00–0.07)
Basophils Absolute: 0 K/uL (ref 0.0–0.1)
Basophils Relative: 0 %
Eosinophils Absolute: 0.2 K/uL (ref 0.0–0.5)
Eosinophils Relative: 2 %
HCT: 18.1 % — ABNORMAL LOW (ref 36.0–46.0)
Hemoglobin: 5.4 g/dL — CL (ref 12.0–15.0)
Immature Granulocytes: 0 %
Lymphocytes Relative: 14 %
Lymphs Abs: 1.3 K/uL (ref 0.7–4.0)
MCH: 31 pg (ref 26.0–34.0)
MCHC: 29.8 g/dL — ABNORMAL LOW (ref 30.0–36.0)
MCV: 104 fL — ABNORMAL HIGH (ref 80.0–100.0)
Monocytes Absolute: 0.7 K/uL (ref 0.1–1.0)
Monocytes Relative: 8 %
Neutro Abs: 7.1 K/uL (ref 1.7–7.7)
Neutrophils Relative %: 76 %
Platelets: 175 K/uL (ref 150–400)
RBC: 1.74 MIL/uL — ABNORMAL LOW (ref 3.87–5.11)
RDW: 15.6 % — ABNORMAL HIGH (ref 11.5–15.5)
WBC: 9.4 K/uL (ref 4.0–10.5)
nRBC: 0 % (ref 0.0–0.2)

## 2018-09-23 LAB — COMPREHENSIVE METABOLIC PANEL
ALBUMIN: 3.5 g/dL (ref 3.5–5.0)
ALT: 22 U/L (ref 0–44)
AST: 28 U/L (ref 15–41)
Alkaline Phosphatase: 36 U/L — ABNORMAL LOW (ref 38–126)
Anion gap: 13 (ref 5–15)
BILIRUBIN TOTAL: 0.6 mg/dL (ref 0.3–1.2)
BUN: 153 mg/dL — AB (ref 8–23)
CO2: 23 mmol/L (ref 22–32)
CREATININE: 4.32 mg/dL — AB (ref 0.44–1.00)
Calcium: 9.9 mg/dL (ref 8.9–10.3)
Chloride: 94 mmol/L — ABNORMAL LOW (ref 98–111)
GFR calc Af Amer: 11 mL/min — ABNORMAL LOW (ref >60)
GFR calc non Af Amer: 10 mL/min — ABNORMAL LOW (ref >60)
Glucose, Bld: 123 mg/dL — ABNORMAL HIGH (ref 70–99)
POTASSIUM: 7.2 mmol/L — AB (ref 3.5–5.1)
Sodium: 130 mmol/L — ABNORMAL LOW (ref 135–145)
TOTAL PROTEIN: 6.3 g/dL — AB (ref 6.5–8.1)

## 2018-09-23 LAB — I-STAT TROPONIN, ED
Troponin i, poc: 0.03 ng/mL (ref 0.00–0.08)
Troponin i, poc: 0.05 ng/mL (ref 0.00–0.08)

## 2018-09-23 LAB — CBG MONITORING, ED: Glucose-Capillary: 104 mg/dL — ABNORMAL HIGH (ref 70–99)

## 2018-09-23 LAB — POC OCCULT BLOOD, ED: Fecal Occult Bld: POSITIVE — AB

## 2018-09-23 MED ORDER — SODIUM CHLORIDE 0.9 % IV BOLUS
500.0000 mL | Freq: Once | INTRAVENOUS | Status: AC
  Administered 2018-09-23: 500 mL via INTRAVENOUS

## 2018-09-23 MED ORDER — DEXTROSE 50 % IV SOLN
1.0000 | Freq: Once | INTRAVENOUS | Status: AC
  Administered 2018-09-23: 50 mL via INTRAVENOUS
  Filled 2018-09-23: qty 50

## 2018-09-23 MED ORDER — DEXTROSE 50 % IV SOLN
1.0000 | Freq: Once | INTRAVENOUS | Status: AC
  Administered 2018-09-23: 50 mL via INTRAVENOUS

## 2018-09-23 MED ORDER — INSULIN ASPART 100 UNIT/ML IV SOLN
10.0000 [IU] | Freq: Once | INTRAVENOUS | Status: AC
  Administered 2018-09-23: 10 [IU] via INTRAVENOUS

## 2018-09-23 MED ORDER — SODIUM POLYSTYRENE SULFONATE 15 GM/60ML PO SUSP
30.0000 g | Freq: Once | ORAL | Status: AC
  Administered 2018-09-24: 30 g via ORAL
  Filled 2018-09-23: qty 120

## 2018-09-23 MED ORDER — SODIUM BICARBONATE 8.4 % IV SOLN
INTRAVENOUS | Status: DC
  Administered 2018-09-23: via INTRAVENOUS
  Filled 2018-09-23 (×2): qty 150

## 2018-09-23 MED ORDER — SODIUM BICARBONATE 8.4 % IV SOLN
100.0000 meq | Freq: Once | INTRAVENOUS | Status: AC
  Administered 2018-09-23: 100 meq via INTRAVENOUS
  Filled 2018-09-23: qty 100

## 2018-09-23 MED ORDER — DEXTROSE 50 % IV SOLN
INTRAVENOUS | Status: AC
  Filled 2018-09-23: qty 50

## 2018-09-23 NOTE — ED Triage Notes (Signed)
Pt here with shortness of breath and chest pain after going to PCP and told to come to ED for elevated potassium levels. Hx of CHF and CKD

## 2018-09-23 NOTE — H&P (Signed)
PULMONARY / CRITICAL CARE MEDICINE   NAME:  [name] [name], MRN:  [id number], DOB:  August 13, 1946, LOS: 0 ADMISSION DATE:  09/23/2018, CONSULTATION DATE: 09/23/2018 REFERRING MD: Dr. Jeanell Sparrow, CHIEF COMPLAINT: Hyperkalemia  BRIEF HISTORY:    72 year old with multiple comorbidities coming in with severe hyperkalemia and acute on chronic kidney disease with severe anemia no signs of bleeding.   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  Patient daughter states that patient visited her cardiologist on September 16, 2018 at that time she was looking very dehydrated and was asked to stop her diuretics (though as per notes she was advised to continue on diuretics) and she was also given potassium supplement for hypokalemia.  Labs were drawn 3 days ago on Friday and there were called today due to elevated potassium of 5.8 and was advised to come to the emergency room upon arrival to the emergency room patients labs are showing potassium of 7.2 with worsening BUN of 153 and anemia with hemoglobin of 6.1.  Patient states that she has been feeling fatigued over the last week or 2 she denies any bleeding per rectum no hematemesis though she did have some few episodes of epistaxis that has stopped.  Patient denies any shortness of breath no chest pain she does complain of chronic back pain.  Patient complains of being very thirsty she does state that her lower limb swelling is improved dramatically over the last few weeks she is to not be able to see hair toes.  We were called to assess the patient and patient was evaluated  immediately down in the emergency room.  Patient did receive 1 ampoule of D50 followed by insulin down in the emergency room and nephrology is being consulted by the emergency room physician.   SIGNIFICANT PAST MEDICAL HISTORY   - Chronic kidney disease stage IV -Systolic congestive heart failure -Severe mitral regurgitation -Pulmonary  hypertension -Morbid obesity -Obstructive sleep apnea  SIGNIFICANT EVENTS:    STUDIES:    CULTURES:    ANTIBIOTICS:  None  LINES/TUBES:    CONSULTANTS:  Nephrology   SUBJECTIVE:    CONSTITUTIONAL: BP (!) 110/48 (BP Location: Left Arm)   Pulse 95   Temp 97.6 F (36.4 C) (Oral)   Resp 18   Ht 5' 6"  (1.676 m)   Wt 109.3 kg   SpO2 93%   BMI 38.90 kg/m   No intake/output data recorded.        PHYSICAL EXAM: General: Looks acutely ill Neuro: Alert oriented to time place and person cranial nerves I to XII are intact power 5/5 all over HEENT: Very dry mucous membranes Cardiovascular: Pansystolic murmur regular. Lungs: Clear equal air sound bilaterally no crackles no wheezing Abdomen: Soft no tenderness no organomegaly Musculoskeletal: Bilateral lower limb edema (improved as per patient)  Skin: No skin rash  RESOLVED PROBLEM LIST   ASSESSMENT AND PLAN   Assessment: -Acute on chronic kidney disease -Severe hyperkalemia -Anemia of chronic kidney disease no signs of bleeding -Metabolic acidosis -History of systolic and diastolic congestive heart failure ejection fraction 40% -Morbid obesity with obstructive sleep apnea -Pulmonary hypertension -Severe MR  Plan: -Admit to the intensive care and for further management -I  will give another D50 and 10 units of IV insulin, 2 ampoules of sodium bicarb -Repeat BMP and follow potassium level at 11:30 PM -Start sodium bicarb drip -Serial BMP -Insert Foley catheter -I will give normal saline 500 mL over 1 hour -Nephrology is being consulted by emergency room  physician -Patient has significant anemia there are no signs of bleeding I will hold off on blood transfusion for now until we get her potassium level at a safer level -Hold diuretics -Discontinue home potassium -Might benefit from cardiology consult in the morning -Discussed with patient and her daughter about potential needing emergent hemodialysis if potassium level does not improve with medical treatment and they agreed with the plan.  I have spent 45 minutes of critical care time this time was spent bedside or in the unit this time was exclusive of any billable procedures patient is needing intensive care due to severe hyperkalemia and anemia.  SUMMARY OF TODAY'S PLAN:   Best Practice / Goals of Care / Disposition.   DVT PROPHYLAXIS: none SUP: None NUTRITION: Sips of water MOBILITY: Bedrest GOALS OF CARE: Full code FAMILY DISCUSSIONS: Discussed with daughter at bedside DISPOSITION ICU admission  LABS  Glucose Recent Labs  Lab 09/23/18 2147  GLUCAP 104*    BMET Recent Labs  Lab 09/23/18 1930 09/23/18 1944  NA 130* 128*  K 7.2* 7.1*  CL 94* 96*  CO2 23  --   BUN 153* >140*  CREATININE 4.32* 4.40*  GLUCOSE 123* 115*    Liver Enzymes Recent Labs  Lab 09/23/18 1930  AST 28  ALT 22  ALKPHOS 36*  BILITOT 0.6  ALBUMIN 3.5    Electrolytes Recent Labs  Lab 09/23/18 1930  CALCIUM 9.9    CBC Recent Labs  Lab 09/23/18 1930 09/23/18 1944  WBC 9.4  --   HGB 5.4* 6.1*  HCT 18.1* 18.0*  PLT 175  --     ABG No results for input(s): PHART, PCO2ART, PO2ART in the last 168 hours.  Coag's No results for input(s): APTT, INR in the last 168 hours.  Sepsis Markers No results for input(s): LATICACIDVEN, PROCALCITON, O2SATVEN in the last 168 hours.  Cardiac Enzymes No results for input(s): TROPONINI, PROBNP in the last 168 hours.  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  PAST SURGICAL HISTORY:  She  has a past surgical history that includes Abdominal hysterectomy (1991); Pacemaker insertion (Nov 2000); Eye surgery; PERCUTANEOUS NEPHROLITHOTOMY (April 2012); Colonoscopy (1998); RIGHT HEART CATH (N/A, 06/28/2018); and TEE without cardioversion (N/A, 06/28/2018).  Allergies  Allergen Reactions  . Cephalexin Shortness Of Breath    sob  . Codeine Anaphylaxis    REACTION: throat swelling  . Contrast Media [Iodinated Diagnostic Agents] Other (See Comments)    Patient states "I have chronic kidney disease so the doctor said no dye in my veins."  . Peppermint Flavor Shortness Of Breath  . Prednisone Anaphylaxis, Shortness Of Breath and Swelling    REACTION: swelling, S.O.B.  . Tape Other (See Comments)    States plastic tape blisters her skin  . Ciprofloxacin Hives  .  Latex Itching and Rash  . Penicillins Hives  . Coreg [Carvedilol] Other (See Comments)    Beta Blockers cause her organs to shut down per patient  . Diamox [Acetazolamide] Rash    Rash after 2 doses of diamox     No current facility-administered medications on file prior to encounter.    Current Outpatient Medications on File Prior to Encounter  Medication Sig  . allopurinol (ZYLOPRIM) 100 MG tablet Take 300 mg by mouth daily.   Marland Kitchen aspirin 325 MG tablet Take 325 mg by mouth every evening.   . calcitRIOL (ROCALTROL) 0.25 MCG capsule Take 0.25 mcg by mouth every other day.   . calcium carbonate (OS-CAL) 600 MG tablet Take 600 mg  by mouth daily.  . COD LIVER OIL PO Take 1 tablet by mouth daily.    Marland Kitchen docusate sodium (COLACE) 100 MG capsule Take 200 mg by mouth daily as needed for mild constipation.   . feeding supplement (BOOST HIGH PROTEIN) LIQD Take 1 Container by mouth daily.  . ferrous sulfate 325 (65 FE) MG tablet Take 325 mg by mouth every evening.   . Glycerin, Adult, 2.1 g SUPP Place 1 suppository rectally daily as needed for moderate constipation.  Marland Kitchen HYDROcodone-acetaminophen (NORCO) 10-325 MG per tablet Take 1 tablet by mouth 3 (three) times daily.   . isosorbide mononitrate (IMDUR) 30 MG 24 hr tablet Take 0.5 tablets (15 mg total) by mouth daily.  . Multiple Vitamin (MULTIVITAMIN) tablet Take 1 tablet by mouth daily.  Marland Kitchen nystatin (MYCOSTATIN/NYSTOP) powder Apply 1 application topically daily.   Marland Kitchen nystatin-triamcinolone ointment (MYCOLOG) Apply topically 2 (two) times daily.  . potassium chloride SA (K-DUR,KLOR-CON) 20 MEQ tablet Take 1 tablet (20 mEq total) by mouth 2 (two) times daily. Will need an additional 80 MEQ today 09/17/18  . torsemide (DEMADEX) 20 MG tablet Take 4 tablets (80 mg total) by mouth 2 (two) times daily. May also take 1 tablet (20 mg total) as needed (for weight greater than 246).  . vitamin B-12 (CYANOCOBALAMIN) 500 MCG tablet Take 500 mcg by mouth daily.  . white petrolatum (VASELINE) GEL Apply 1 application topically daily as needed for dry skin.    FAMILY HISTORY:   Her family history includes Colon cancer in an other family member; Diabetes in her sister; Heart attack in her father; Stroke in her mother. There is no history of Liver disease or Other.  SOCIAL HISTORY:  She  reports that she quit smoking about 39 years ago. Her smoking use included cigarettes. She smoked 1.00 pack per day. She has never used smokeless tobacco. She reports that she does not drink alcohol or use drugs.  REVIEW OF SYSTEMS:    All 11 point system review were unremarkable other than what is mentioned  history present illness.

## 2018-09-23 NOTE — ED Provider Notes (Addendum)
[facility] EMERGENCY DEPARTMENT Provider Note   CSN: [id number] Arrival date & time: 09/23/18  1850     History   Chief Complaint Chief Complaint  Patient presents with  . Chest Pain  . Shortness of Breath    HPI [name] [name] is a 72 y.o. female.  HPI  72 yo female with ho a fib, chronic chf, s/p pacemaker, hypertension, morbid obesity presents today with increasing weakness and hyperkalemia. Patient seen at PMD office and noted hyperkalemia.  Patient with potassium 7.1 here.  Patient states weakness began one week ago. Associated chest pain described as tightness coming and going.    Past Medical History:  Diagnosis Date  . Anemia   . Atrial fibrillation ([place])   . Cataract   . Cervical cancer ([place]) 1991   s/p hysterectomy  . Chronic cellulitis   . Chronic combined systolic and diastolic congestive heart failure, NYHA class 2 (H[facility])    LVEF 25-30% with restrictive diastolic filling  . Degenerative joint disease   . Diverticulitis   . Essential hypertension   . GERD (gastroesophageal reflux disease)   . Gout   . Kidney stones   . Morbid obesity ([place])   . NICM (nonischemic cardiomyopathy) ([place]) 02/22/2017  . Osteoarthritis   . Permanent atrial fibrillation 05/04/2009   Qualifier: History of  By: Quentin Cornwall CMA, Janett Billow    . 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 Sinoatrial node dysfunction [facility])    Status post PPM - Dr. Lovena Le  . Sleep apnea    CPAP    Patient Active Problem List   Diagnosis Date Noted  . Syncope 06/21/2018  . Iron deficiency 06/21/2018  . GERD (gastroesophageal reflux disease) 10/04/2017  . Acute on chronic combined systolic and diastolic CHF (congestive heart failure) ([place]) 04/02/2017  . NICM (nonischemic cardiomyopathy) ([place]) 02/22/2017  . CKD stage G3b/A1, GFR 30-44 and albumin creatinine ratio <30 mg/g (H[facility])   . Dysphagia, idiopathic 02/15/2017    . Pleural effusion on right 01/30/2017  . Osteoporosis 08/10/2016  . 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 Chest pain 10/30/2014  . CKD (chronic kidney disease), stage IV ([place]) 03/07/2013  . Sinoatrial node dysfunction ([facility])   . Chronic combined systolic and diastolic CHF (congestive heart failure) ([place]) 05/07/2011  . Morbid obesity ([place]) 05/07/2011  . 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 OSA (obstructive sleep apnea) 05/05/2009  . Essential hypertension 05/04/2009  . Permanent atrial fibrillation 05/04/2009  . ALLERGIC RHINITIS 05/04/2009    Past Surgical History:  Procedure Laterality Date  . ABDOMINAL HYSTERECTOMY  1991  . COLONOSCOPY  1998   one polyp per patient  . EYE SURGERY    . PACEMAKER INSERTION  Nov 2000   St Jude with revision in 2011  . PERCUTANEOUS NEPHROLITHOTOMY  April 2012  . RIGHT HEART CATH N/A 06/28/2018   Procedure: RIGHT HEART CATH;  Surgeon: Jolaine Artist, MD;  Location: [facility] CV LAB;  Service: Cardiovascular;  Laterality: N/A;  . TEE WITHOUT CARDIOVERSION N/A 06/28/2018   Procedure: TRANSESOPHAGEAL ECHOCARDIOGRAM (TEE);  Surgeon: Jolaine Artist, MD;  Location: [facility] ENDOSCOPY;  Service: Cardiovascular;  Laterality: N/A;     OB History   No obstetric history on file.      Home Medications    Prior to Admission medications   Medication Sig Start Date End Date Taking? Authorizing Provider  allopurinol (  ZYLOPRIM) 100 MG tablet Take 300 mg by mouth daily.     [provider]  aspirin 325 MG tablet Take 325 mg by mouth every evening.     [provider]  calcitRIOL (ROCALTROL) 0.25 MCG capsule Take 0.25 mcg by mouth every other day.     [provider]  calcium carbonate (OS-CAL) 600 MG tablet Take 600 mg by mouth daily.    [provider]  COD LIVER OIL PO Take 1 tablet by mouth daily.      [provider]  docusate sodium (COLACE) 100 MG capsule Take 200 mg by mouth  daily as needed for mild constipation.     [provider]  feeding supplement (BOOST HIGH PROTEIN) LIQD Take 1 Container by mouth daily.    [provider]  ferrous sulfate 325 (65 FE) MG tablet Take 325 mg by mouth every evening.     [provider]  Glycerin, Adult, 2.1 g SUPP Place 1 suppository rectally daily as needed for moderate constipation. 02/22/17   Barrett, Evelene Croon, PA-C  HYDROcodone-acetaminophen (NORCO) 10-325 MG per tablet Take 1 tablet by mouth 3 (three) times daily.     [provider]  isosorbide mononitrate (IMDUR) 30 MG 24 hr tablet Take 0.5 tablets (15 mg total) by mouth daily. 12/10/17   Fay Records, MD  Multiple Vitamin (MULTIVITAMIN) tablet Take 1 tablet by mouth daily.    [provider]  nystatin (MYCOSTATIN/NYSTOP) powder Apply 1 application topically daily.  02/05/17   [provider]  nystatin-triamcinolone ointment (MYCOLOG) Apply topically 2 (two) times daily. 02/22/17   Barrett, Evelene Croon, PA-C  potassium chloride SA (K-DUR,KLOR-CON) 20 MEQ tablet Take 1 tablet (20 mEq total) by mouth 2 (two) times daily. Will need an additional 56 MEQ today 09/17/18 09/17/18   Bensimhon, Shaune Pascal, MD  torsemide (DEMADEX) 20 MG tablet Take 4 tablets (80 mg total) by mouth 2 (two) times daily. May also take 1 tablet (20 mg total) as needed (for weight greater than 246). 07/15/18   Clegg, Amy D, NP  vitamin B-12 (CYANOCOBALAMIN) 500 MCG tablet Take 500 mcg by mouth daily.    [provider]  white petrolatum (VASELINE) GEL Apply 1 application topically daily as needed for dry skin.    [provider]    Family History Family History  Problem Relation Age of Onset  . Heart attack Father   . Stroke Mother   . Diabetes Sister   . Colon cancer Other        seven family members  . Liver disease Neg Hx   . Other Neg Hx     Social History Social History   Tobacco Use  . Smoking status: Former Smoker     Packs/day: 1.00    Types: Cigarettes    Last attempt to quit: 09/26/1979    Years since quitting: 39.0  . Smokeless tobacco: Never Used  Substance Use Topics  . Alcohol use: No  . Drug use: No     Allergies   Cephalexin; Codeine; Contrast media [iodinated diagnostic agents]; Peppermint flavor; Prednisone; Tape; Ciprofloxacin; Latex; Penicillins; Coreg [carvedilol]; and Diamox [acetazolamide]   Review of Systems Review of Systems  All other systems reviewed and are negative.    Physical Exam Updated Vital Signs BP (!) 110/48 (BP Location: Left Arm)   Pulse 95   Temp 97.6 F (36.4 C) (Oral)   Resp 18   Ht 1.676 m (5' 6" )  Wt 109.3 kg   SpO2 93%   BMI 38.90 kg/m   Physical Exam Vitals signs and nursing note reviewed.  Constitutional:      Appearance: She is well-developed. She is obese.  HENT:     Head: Normocephalic.  Eyes:     Extraocular Movements: Extraocular movements intact.     Pupils: Pupils are equal, round, and reactive to light.  Neck:     Musculoskeletal: Normal range of motion.  Cardiovascular:     Rate and Rhythm: Normal rate.     Heart sounds: Normal heart sounds.  Pulmonary:     Effort: Pulmonary effort is normal.  Abdominal:     General: Bowel sounds are normal.     Palpations: Abdomen is soft.     Comments: Periumbilical hernia- easily reduced  Musculoskeletal:     Comments: Chronic skin changes bilateral lower extremities  Skin:    General: Skin is warm.     Capillary Refill: Capillary refill takes less than 2 seconds.  Neurological:     General: No focal deficit present.     Mental Status: She is alert and oriented to person, place, and time.     Cranial Nerves: No cranial nerve deficit.  Psychiatric:        Mood and Affect: Mood normal.      ED Treatments / Results  Labs (all labs ordered are listed, but only abnormal results are displayed) Labs Reviewed  CBC WITH DIFFERENTIAL/PLATELET - Abnormal; Notable for the following  components:      Result Value   RBC 1.74 (*)    Hemoglobin 5.4 (*)    HCT 18.1 (*)    MCV 104.0 (*)    MCHC 29.8 (*)    RDW 15.6 (*)    All other components within normal limits  COMPREHENSIVE METABOLIC PANEL - Abnormal; Notable for the following components:   Sodium 130 (*)    Potassium 7.2 (*)    Chloride 94 (*)    Glucose, Bld 123 (*)    BUN 153 (*)    Creatinine, Ser 4.32 (*)    Total Protein 6.3 (*)    Alkaline Phosphatase 36 (*)    GFR calc non Af Amer 10 (*)    GFR calc Af Amer 11 (*)    All other components within normal limits  I-STAT CHEM 8, ED - Abnormal; Notable for the following components:   Sodium 128 (*)    Potassium 7.1 (*)    Chloride 96 (*)    BUN >140 (*)    Creatinine, Ser 4.40 (*)    Glucose, Bld 115 (*)    Hemoglobin 6.1 (*)    HCT 18.0 (*)    All other components within normal limits  I-STAT VENOUS BLOOD GAS, ED - Abnormal; Notable for the following components:   pO2, Ven 21.0 (*)    All other components within normal limits  BLOOD GAS, VENOUS  I-STAT TROPONIN, ED    EKG EKG Interpretation  Date/Time:  Monday September 23 2018 18:58:41 EST Ventricular Rate:  105 PR Interval:    QRS Duration: 62 QT Interval:  328 QTC Calculation: 433 R Axis:   -107 Text Interpretation:  VENTRICULAR PACED RHYTHM Confirmed by Pattricia Boss [phone]) on 09/23/2018 7:53:49 PM   Radiology Dg Chest 1 View  Result Date: 09/23/2018 CLINICAL DATA:  Tachycardia EXAM: CHEST  1 VIEW COMPARISON:  06/25/2018 FINDINGS: Left pacer remains in place, unchanged. Cardiomegaly with vascular congestion. Perihilar and lower lobe opacities  likely reflect edema, improved since prior study. Improving right effusion with small residual right effusion. IMPRESSION: Cardiomegaly with vascular congestion and mild pulmonary edema, improving since prior study. Small right effusion, and also improving since prior study. Electronically Signed   By: Rolm Baptise M.D.   On: 09/23/2018 20:00     Procedures .Critical Care Performed by: Pattricia Boss, MD Authorized by: Pattricia Boss, MD   Critical care provider statement:    Critical care time (minutes):  60   Critical care end time:  09/23/2018 10:22 PM   Critical care was necessary to treat or prevent imminent or life-threatening deterioration of the following conditions:  Renal failure and circulatory failure   Critical care was time spent personally by me on the following activities:  Discussions with consultants, evaluation of patient's response to treatment, examination of patient, ordering and performing treatments and interventions, ordering and review of laboratory studies, ordering and review of radiographic studies, pulse oximetry, re-evaluation of patient's condition, obtaining history from patient or surrogate and review of old charts   (including critical care time)  Medications Ordered in ED Medications - No data to display   Initial Impression / Assessment and Plan / ED Course  I have reviewed the triage vital signs and the nursing notes.  Pertinent labs & imaging results that were available during my care of the patient were reviewed by me and considered in my medical decision making (see chart for details).    Patient presents today with increasing weakness Work up reveals 1- hyperkalemia- patient with increased po potassium due to hypokalemia started 12/23 now with significant hyperkalemia 2-uremia and worsening renal failure 3-anemia-with significant drop from 11 on 06/30/2018 two 5.4 today.Will hemocult 1 unit prbc ordered- 4- ho chf with ticuspid regurg, mitral regurg,  aortic stensosis,  A fib s/p ablation chronicall rv paced Plan nephrology and cc consult and admission  Discussed with Dr. Carolin Sicks, on call for neurology   Final Clinical Impressions(s) / ED Diagnoses   Final diagnoses:  Hyperkalemia  Anemia, unspecified type  Uremia    ED Discharge Orders    None       Pattricia Boss,  MD 09/23/18 2223    Pattricia Boss, MD 09/23/18 [phone]

## 2018-09-23 NOTE — Telephone Encounter (Signed)
Abnormal results received Labs drawn 12/28 K 5.8 BUN 143 Cr 3.17  Per VO Rebecca Eaton patient should report to ER for further evaluation   Pt aware and voiced understanding

## 2018-09-24 ENCOUNTER — Inpatient Hospital Stay (HOSPITAL_COMMUNITY): Payer: Medicare Other

## 2018-09-24 DIAGNOSIS — D62 Acute posthemorrhagic anemia: Secondary | ICD-10-CM

## 2018-09-24 DIAGNOSIS — N179 Acute kidney failure, unspecified: Secondary | ICD-10-CM | POA: Diagnosis present

## 2018-09-24 DIAGNOSIS — Z452 Encounter for adjustment and management of vascular access device: Secondary | ICD-10-CM

## 2018-09-24 DIAGNOSIS — D500 Iron deficiency anemia secondary to blood loss (chronic): Secondary | ICD-10-CM

## 2018-09-24 LAB — TROPONIN I
TROPONIN I: 0.06 ng/mL — AB (ref <0.03)
Troponin I: 0.05 ng/mL (ref <0.03)
Troponin I: 0.06 ng/mL (ref <0.03)

## 2018-09-24 LAB — PROTIME-INR
INR: 1.22
Prothrombin Time: 15.2 seconds (ref 11.4–15.2)

## 2018-09-24 LAB — URINALYSIS, ROUTINE W REFLEX MICROSCOPIC
BACTERIA UA: NONE SEEN
BILIRUBIN URINE: NEGATIVE
Glucose, UA: NEGATIVE mg/dL
Ketones, ur: NEGATIVE mg/dL
Nitrite: NEGATIVE
Protein, ur: NEGATIVE mg/dL
Specific Gravity, Urine: 1.01 (ref 1.005–1.030)
pH: 7 (ref 5.0–8.0)

## 2018-09-24 LAB — HEMOGLOBIN AND HEMATOCRIT, BLOOD
HCT: 18.6 % — ABNORMAL LOW (ref 36.0–46.0)
Hemoglobin: 5.8 g/dL — CL (ref 12.0–15.0)

## 2018-09-24 LAB — IRON AND TIBC
Iron: 14 ug/dL — ABNORMAL LOW (ref 28–170)
Saturation Ratios: 3 % — ABNORMAL LOW (ref 10.4–31.8)
TIBC: 406 ug/dL (ref 250–450)
UIBC: 392 ug/dL

## 2018-09-24 LAB — CBC
HCT: 15.8 % — ABNORMAL LOW (ref 36.0–46.0)
HCT: 20.6 % — ABNORMAL LOW (ref 36.0–46.0)
Hemoglobin: 4.9 g/dL — CL (ref 12.0–15.0)
Hemoglobin: 6.2 g/dL — CL (ref 12.0–15.0)
MCH: 31.2 pg (ref 26.0–34.0)
MCH: 29.7 pg (ref 26.0–34.0)
MCHC: 31 g/dL (ref 30.0–36.0)
MCHC: 30.1 g/dL (ref 30.0–36.0)
MCV: 100.6 fL — ABNORMAL HIGH (ref 80.0–100.0)
MCV: 98.6 fL (ref 80.0–100.0)
NRBC: 0 % (ref 0.0–0.2)
Platelets: 149 10*3/uL — ABNORMAL LOW (ref 150–400)
Platelets: 113 10*3/uL — ABNORMAL LOW (ref 150–400)
RBC: 1.57 MIL/uL — ABNORMAL LOW (ref 3.87–5.11)
RBC: 2.09 MIL/uL — ABNORMAL LOW (ref 3.87–5.11)
RDW: 15.7 % — ABNORMAL HIGH (ref 11.5–15.5)
RDW: 17.2 % — ABNORMAL HIGH (ref 11.5–15.5)
WBC: 6.8 10*3/uL (ref 4.0–10.5)
WBC: 5.5 10*3/uL (ref 4.0–10.5)
nRBC: 0 % (ref 0.0–0.2)

## 2018-09-24 LAB — BASIC METABOLIC PANEL
Anion gap: 13 (ref 5–15)
Anion gap: 13 (ref 5–15)
Anion gap: 11 (ref 5–15)
Anion gap: 12 (ref 5–15)
BUN: 154 mg/dL — ABNORMAL HIGH (ref 8–23)
BUN: 144 mg/dL — ABNORMAL HIGH (ref 8–23)
BUN: 139 mg/dL — ABNORMAL HIGH (ref 8–23)
BUN: 152 mg/dL — AB (ref 8–23)
CO2: 27 mmol/L (ref 22–32)
CO2: 28 mmol/L (ref 22–32)
CO2: 26 mmol/L (ref 22–32)
CO2: 26 mmol/L (ref 22–32)
Calcium: 9.6 mg/dL (ref 8.9–10.3)
Calcium: 9.4 mg/dL (ref 8.9–10.3)
Calcium: 9.2 mg/dL (ref 8.9–10.3)
Calcium: 8.9 mg/dL (ref 8.9–10.3)
Chloride: 96 mmol/L — ABNORMAL LOW (ref 98–111)
Chloride: 98 mmol/L (ref 98–111)
Chloride: 98 mmol/L (ref 98–111)
Chloride: 101 mmol/L (ref 98–111)
Creatinine, Ser: 4.32 mg/dL — ABNORMAL HIGH (ref 0.44–1.00)
Creatinine, Ser: 4.27 mg/dL — ABNORMAL HIGH (ref 0.44–1.00)
Creatinine, Ser: 4.16 mg/dL — ABNORMAL HIGH (ref 0.44–1.00)
Creatinine, Ser: 4 mg/dL — ABNORMAL HIGH (ref 0.44–1.00)
GFR calc Af Amer: 11 mL/min — ABNORMAL LOW (ref >60)
GFR calc Af Amer: 11 mL/min — ABNORMAL LOW (ref >60)
GFR calc Af Amer: 12 mL/min — ABNORMAL LOW (ref >60)
GFR calc non Af Amer: 10 mL/min — ABNORMAL LOW (ref >60)
GFR calc non Af Amer: 10 mL/min — ABNORMAL LOW (ref >60)
GFR calc non Af Amer: 10 mL/min — ABNORMAL LOW (ref >60)
GFR calc non Af Amer: 10 mL/min — ABNORMAL LOW (ref >60)
GFR, EST AFRICAN AMERICAN: 12 mL/min — AB (ref >60)
GLUCOSE: 65 mg/dL — AB (ref 70–99)
Glucose, Bld: 119 mg/dL — ABNORMAL HIGH (ref 70–99)
Glucose, Bld: 97 mg/dL (ref 70–99)
Glucose, Bld: 96 mg/dL (ref 70–99)
Potassium: 6 mmol/L — ABNORMAL HIGH (ref 3.5–5.1)
Potassium: 5.8 mmol/L — ABNORMAL HIGH (ref 3.5–5.1)
Potassium: 5.2 mmol/L — ABNORMAL HIGH (ref 3.5–5.1)
Potassium: 4.4 mmol/L (ref 3.5–5.1)
Sodium: 135 mmol/L (ref 135–145)
Sodium: 138 mmol/L (ref 135–145)
Sodium: 137 mmol/L (ref 135–145)
Sodium: 139 mmol/L (ref 135–145)

## 2018-09-24 LAB — NA AND K (SODIUM & POTASSIUM), RAND UR
Potassium Urine: 83 mmol/L
Sodium, Ur: <10 mmol/L

## 2018-09-24 LAB — FERRITIN: Ferritin: 58 ng/mL (ref 11–307)

## 2018-09-24 LAB — PREPARE RBC (CROSSMATCH)

## 2018-09-24 LAB — MRSA PCR SCREENING: MRSA by PCR: NEGATIVE

## 2018-09-24 LAB — OSMOLALITY, URINE: Osmolality, Ur: 361 mOsm/kg (ref 300–900)

## 2018-09-24 MED ORDER — "THROMBI-PAD 3""X3"" EX PADS"
1.0000 | MEDICATED_PAD | Freq: Once | CUTANEOUS | Status: DC
  Filled 2018-09-24: qty 1

## 2018-09-24 MED ORDER — SODIUM CHLORIDE 0.9% IV SOLUTION: Freq: Once | INTRAVENOUS | Status: DC

## 2018-09-24 MED ORDER — SODIUM CHLORIDE 0.9% IV SOLUTION
Freq: Once | INTRAVENOUS | Status: AC
  Administered 2018-09-25: via INTRAVENOUS

## 2018-09-24 MED ORDER — ORAL CARE MOUTH RINSE: 15.0000 mL | Freq: Two times a day (BID) | OROMUCOSAL | Status: DC

## 2018-09-24 MED ORDER — SODIUM CHLORIDE 0.9% FLUSH: 10.0000 mL | INTRAVENOUS | Status: DC | PRN

## 2018-09-24 MED ORDER — CHLORHEXIDINE GLUCONATE CLOTH 2 % EX PADS
6.0000 | MEDICATED_PAD | Freq: Every day | CUTANEOUS | Status: DC
  Administered 2018-09-25 – 2018-09-28 (×4): 6 via TOPICAL

## 2018-09-24 MED ORDER — HYDROCODONE-ACETAMINOPHEN 10-325 MG PO TABS
1.0000 | ORAL_TABLET | Freq: Four times a day (QID) | ORAL | Status: DC | PRN
  Administered 2018-09-25 – 2018-09-26 (×5): 1 via ORAL
  Filled 2018-09-24 (×5): qty 1

## 2018-09-24 MED ORDER — SODIUM CHLORIDE 0.9% FLUSH
10.0000 mL | Freq: Two times a day (BID) | INTRAVENOUS | Status: DC
  Administered 2018-09-24 – 2018-09-25 (×3): 10 mL
  Administered 2018-09-26: 20 mL
  Administered 2018-09-26 – 2018-09-28 (×4): 10 mL
  Administered 2018-09-29: 30 mL

## 2018-09-24 MED ORDER — CALCIUM GLUCONATE-NACL 1-0.675 GM/50ML-% IV SOLN
1.0000 g | Freq: Once | INTRAVENOUS | Status: AC
  Administered 2018-09-24: 1000 mg via INTRAVENOUS
  Filled 2018-09-24: qty 50

## 2018-09-24 MED ORDER — CHLORHEXIDINE GLUCONATE 0.12 % MT SOLN
15.0000 mL | Freq: Two times a day (BID) | OROMUCOSAL | Status: DC
  Administered 2018-09-24: 15 mL via OROMUCOSAL

## 2018-09-24 MED ORDER — PANTOPRAZOLE SODIUM 40 MG IV SOLR
40.0000 mg | Freq: Two times a day (BID) | INTRAVENOUS | Status: DC
  Administered 2018-09-24 – 2018-09-27 (×6): 40 mg via INTRAVENOUS
  Filled 2018-09-24 (×6): qty 40

## 2018-09-24 MED ORDER — SODIUM ZIRCONIUM CYCLOSILICATE 10 G PO PACK
10.0000 g | PACK | Freq: Two times a day (BID) | ORAL | Status: DC
  Administered 2018-09-24 – 2018-09-25 (×4): 10 g via ORAL
  Filled 2018-09-24 (×4): qty 1

## 2018-09-24 MED ORDER — SODIUM CHLORIDE 0.9 % IV SOLN
INTRAVENOUS | Status: DC
  Administered 2018-09-24: 03:00:00 via INTRAVENOUS

## 2018-09-24 MED ORDER — SODIUM CHLORIDE 0.9% IV SOLUTION
Freq: Once | INTRAVENOUS | Status: AC
  Administered 2018-09-24: 06:00:00 via INTRAVENOUS

## 2018-09-24 MED ORDER — SODIUM CHLORIDE 0.9 % IV BOLUS
500.0000 mL | Freq: Once | INTRAVENOUS | Status: AC
  Administered 2018-09-24: 500 mL via INTRAVENOUS

## 2018-09-24 MED ORDER — SODIUM CHLORIDE 0.9% IV SOLUTION: Freq: Once | INTRAVENOUS | Status: AC

## 2018-09-24 NOTE — Progress Notes (Signed)
[facility] Progress Note Patient Name: [name] [name] DOB: 08/31/1946 MRN: [id number]   Date of Service  09/24/2018  HPI/Events of Note  Anemia - Hgb = 6.2 s/p 2 units PRBC.   eICU Interventions  Will order: 1. Transfuse 1 unit PRBC now.      Intervention Category Major Interventions: Other:  Lysle Dingwall 09/24/2018, 11:39 PM

## 2018-09-24 NOTE — H&P (Addendum)
PULMONARY / CRITICAL CARE MEDICINE Progress Note  NAME:  [name] [name], MRN:  [id number], DOB:  11-20-1945, LOS: 1 ADMISSION DATE:  09/23/2018, CONSULTATION DATE: 09/23/2018 REFERRING MD: Dr. Jeanell Sparrow, CHIEF COMPLAINT: Hyperkalemia  BRIEF HISTORY:    [name] is a 72 year old female with stage IV CKD, systolic heart failure with severe mitral regurgitation, pulmonary hypertension and obstructive sleep apnea who presents with severe hyperkalemia and acute blood loss anemia.  She has been treated with Lokelma, Kayexalate and IV fluids.  PCCM consulted for severe electrolyte abnormalities and work-up of acute blood loss anemia.  SIGNIFICANT PAST MEDICAL HISTORY   -Chronic kidney disease stage IV -Systolic congestive heart failure -Severe mitral regurgitation -Pulmonary  hypertension -Morbid obesity -Obstructive sleep apnea -Afib s/p AV nodal ablation and PPM  SIGNIFICANT EVENTS:  12/31 admitted  STUDIES:    CULTURES:    ANTIBIOTICS:  None  LINES/TUBES:    CONSULTANTS:  Nephrology   SUBJECTIVE:    CONSTITUTIONAL: BP (!) 114/48   Pulse 100   Temp 98.2 F (36.8 C) (Oral)   Resp (!) 22   Ht 5' 6"  (1.676 m)   Wt 108.5 kg   SpO2 96%   BMI 38.61 kg/m   I/O last 3 completed shifts: In: 14 [I.V.:17; Blood:30] Out: 400 [Urine:400]        Physical Exam: General: Frail appearing female, laying in bed HENT: [place], AT, pale conjunctiva, dry mucous membranes Eyes: EOMI, no scleral icterus Respiratory: Clear to auscultation bilaterally.  No crackles, wheezing or rales Cardiovascular: RRR, -M/R/G, no JVD GI: BS+, soft, nontender Extremities:-Edema,-tenderness Neuro: AAO x4, CNII-XII grossly intact Skin: Venous insufficiency Psych: Normal mood, normal affect GU: Foley in place   RESOLVED PROBLEM LIST   ASSESSMENT AND PLAN    Acute blood loss anemia Hx Iron Deficiency Anemia -Follow-up posttransfusion CBC -Trend CBC every 6 hours -Transfuse for goal Hg  >7  Hyperkalemia S/p calcium gluconate, insulin, Lokelma and Kayexalate with improvement. EKG - V-paced -Continue Lokelma 10 mg BID x 48 hours -Trend BMP -Hold home potassium  Acute on Chronic Kidney Injury Stage IV CKD Baseline Cr 2.5-3. Renal US with no evidence of hydronephrosis. Concerned for dehydration. Received IVF overnight -NS bolus 500cc now and reassess -Trend BMP -Foley in place. Monitor UOP -Nephrology following  Chronic systolic and diastolic CHF Severe MR/TR Pulmonary HTN Followed by Dr. Haroldine Laws in [facility]. EF 40-45% on 06/20/18 -Hold on diuresis   Afib s/p AV nodal ablation and PPM Chronic RV pacing in 90s. Patient declined anticoagulation -Continue ASA  OSA -Continue BiPAP nightly   SUMMARY OF TODAY'S PLAN:  Serial BMPs Trend CBC Transfuse with goal Hg >7  Best Practice / Goals of Care / Disposition.   DVT PROPHYLAXIS: none SUP: None NUTRITION: NPO MOBILITY: Bedrest GOALS OF CARE: Full code FAMILY DISCUSSIONS: No family at bedside DISPOSITION ICU admission  LABS  Glucose Recent Labs  Lab 09/23/18 2147  GLUCAP 104*    BMET Recent Labs  Lab 09/23/18 1930 09/23/18 1944 09/24/18 0021 09/24/18 0316  NA 130* 128* 135 138  K 7.2* 7.1* 6.0* 5.8*  CL 94* 96* 96* 98  CO2 23  --  26 27  BUN 153* >140* 152* 154*  CREATININE 4.32* 4.40* 4.32* 4.27*  GLUCOSE 123* 115* 65* 119*    Liver Enzymes Recent Labs  Lab 09/23/18 1930  AST 28  ALT 22  ALKPHOS 36*  BILITOT 0.6  ALBUMIN 3.5    Electrolytes Recent Labs  Lab 09/23/18 1930  09/24/18 0021 09/24/18 0316  CALCIUM 9.9 9.6 9.4    CBC Recent Labs  Lab 09/23/18 1930 09/23/18 1944 09/24/18 0316  WBC 9.4  --  6.8  HGB 5.4* 6.1* 4.9*  HCT 18.1* 18.0* 15.8*  PLT 175  --  149*    ABG No results for input(s): PHART, PCO2ART, PO2ART in the last 168 hours.  Coag's No results for input(s): APTT, INR in the last 168 hours.  Sepsis Markers No results for  input(s): LATICACIDVEN, PROCALCITON, O2SATVEN in the last 168 hours.  Cardiac Enzymes Recent Labs  Lab 09/24/18 0316  TROPONINI 0.05*    The patient is critically ill with multiple organ systems failure and requires high complexity decision making for assessment and support, frequent evaluation and titration of therapies, application of advanced monitoring technologies and extensive interpretation of multiple databases.   Critical Care Time devoted to patient care services described in this note is  45 Minutes. This time reflects time of care of this signee Dr. Rodman Pickle. This critical care time does not reflect procedure time, or teaching time or supervisory time of PA/NP/Med student/Med Resident etc but could involve care discussion time.  Rodman Pickle, M.D. [facility] Pulmonary/Critical Care Medicine. Pager: [phone]. After hours pager: [phone].

## 2018-09-24 NOTE — Consult Note (Addendum)
[facility] Nephrology Consultation Note  Requesting MD: EDP and Dr. Elwyn Reach Reason for consult: Hyperkalemia and AKI on CKD.  HPI:  [name] [name] is a 72 y.o. female with morbid obesity, A. fib status post AV ablation, HTN, chronic combined heart failure, CKD stage IV follows with Dr. Jimmy Footman with baseline serum creatinine level around 2.5-3, sent by her cardiologist for the evaluation of hyperkalemia and elevated serum creatinine level .  Patient was on torsemide 80 mg bid and metolazone weekly until she was evaluated by her cardiologist on 09/16/2018. She had lab done on that visit which showed serum potassium level of 2.7 and creatinine level 3.13.  She was advised to hold torsemide for few days and metolazone discontinued.  The potassium chloride dose increased and patient was taking 30 mEq 4 times daily.  Subsequently the follow-up labs showed serum potassium level of 5.8, BUN 143 therefore she was advised to go to ER.  In the ER, the initial labs showed serum sodium 130, potassium 7.2, CO2 23, BUN 153, creatinine 4.32.  She was also found to have hemoglobin of 5.4 which is significantly dropped from her baseline of 10-11.  Patient reported that she has weakness worsening recently but denied chest pain, shortness of breath.  Denied GI bleeding or hematemesis.  She reports dry mouth and stated that her lower extremity edema has gone.  Denied use of NSAIDs.  She did not notice decreased urine output and denied any dysuria, urgency frequency.  In the ER patient was treated with dextrose, insulin, Kayexalate and sodium bicarbonate.  Consulted for further evaluation.   Creat  Date/Time Value Ref Range Status  04/13/2014 02:05 PM 1.36 (H) 0.50 - 1.10 mg/dL Final  06/30/2013 01:06 PM 1.43 (H) 0.50 - 1.10 mg/dL Final  06/16/2013 02:03 PM 1.39 (H) 0.50 - 1.10 mg/dL Final  05/30/2013 01:36 PM 1.69 (H) 0.50 - 1.10 mg/dL Final   Creatinine, Ser  Date/Time Value Ref Range Status   09/23/2018 07:44 PM 4.40 (H) 0.44 - 1.00 mg/dL Final  09/23/2018 07:30 PM 4.32 (H) 0.44 - 1.00 mg/dL Final  09/16/2018 01:16 PM 3.13 (H) 0.44 - 1.00 mg/dL Final  07/02/2018 01:49 AM 2.54 (H) 0.44 - 1.00 mg/dL Final  07/01/2018 02:32 AM 2.40 (H) 0.44 - 1.00 mg/dL Final  06/30/2018 08:40 AM 2.37 (H) 0.44 - 1.00 mg/dL Final  06/29/2018 02:12 AM 2.48 (H) 0.44 - 1.00 mg/dL Final  06/28/2018 05:34 AM 2.46 (H) 0.44 - 1.00 mg/dL Final  06/27/2018 03:53 AM 2.46 (H) 0.44 - 1.00 mg/dL Final  06/26/2018 03:39 AM 2.57 (H) 0.44 - 1.00 mg/dL Final  06/25/2018 02:57 AM 2.36 (H) 0.44 - 1.00 mg/dL Final  06/24/2018 03:31 AM 2.30 (H) 0.44 - 1.00 mg/dL Final  06/23/2018 02:47 AM 2.48 (H) 0.44 - 1.00 mg/dL Final  06/22/2018 12:59 AM 2.56 (H) 0.44 - 1.00 mg/dL Final  06/21/2018 03:52 PM 2.44 (H) 0.44 - 1.00 mg/dL Final  06/21/2018 08:21 AM 2.70 (H) 0.44 - 1.00 mg/dL Final  06/20/2018 10:49 PM 2.72 (H) 0.44 - 1.00 mg/dL Final  05/25/2017 12:56 PM 2.64 (H) 0.57 - 1.00 mg/dL Final  05/17/2017 09:00 AM 2.16 (H) 0.57 - 1.00 mg/dL Final  05/10/2017 08:45 AM 2.39 (H) 0.57 - 1.00 mg/dL Final  05/04/2017 01:42 PM 1.91 (H) 0.57 - 1.00 mg/dL Final  04/07/2017 04:40 AM 1.96 (H) 0.44 - 1.00 mg/dL Final  04/06/2017 02:50 AM 1.87 (H) 0.44 - 1.00 mg/dL Final  04/05/2017 04:32 AM 1.86 (H) 0.44 - 1.00  mg/dL Final  04/04/2017 03:36 AM 1.80 (H) 0.44 - 1.00 mg/dL Final  04/03/2017 03:36 AM 1.85 (H) 0.44 - 1.00 mg/dL Final  04/02/2017 02:18 PM 1.91 (H) 0.44 - 1.00 mg/dL Final  03/30/2017 10:08 AM 1.97 (H) 0.57 - 1.00 mg/dL Final  03/16/2017 09:10 AM 2.62 (H) 0.57 - 1.00 mg/dL Final  03/06/2017 09:00 AM 2.32 (H) 0.57 - 1.00 mg/dL Final  02/26/2017 10:57 AM 1.96 (H) 0.57 - 1.00 mg/dL Final  02/22/2017 05:26 AM 2.02 (H) 0.44 - 1.00 mg/dL Final  02/21/2017 02:35 AM 2.06 (H) 0.44 - 1.00 mg/dL Final  02/20/2017 05:58 AM 1.97 (H) 0.44 - 1.00 mg/dL Final  02/19/2017 02:53 AM 1.87 (H) 0.44 - 1.00 mg/dL Final  02/18/2017 04:10 AM  1.89 (H) 0.44 - 1.00 mg/dL Final  02/17/2017 04:06 AM 1.81 (H) 0.44 - 1.00 mg/dL Final  02/16/2017 01:39 PM 1.82 (H) 0.44 - 1.00 mg/dL Final  12/03/2016 06:12 AM 2.09 (H) 0.44 - 1.00 mg/dL Final  12/02/2016 05:55 AM 2.12 (H) 0.44 - 1.00 mg/dL Final  12/01/2016 05:42 AM 2.00 (H) 0.44 - 1.00 mg/dL Final  11/30/2016 05:37 AM 1.99 (H) 0.44 - 1.00 mg/dL Final  11/29/2016 05:40 AM 2.00 (H) 0.44 - 1.00 mg/dL Final  11/28/2016 05:02 AM 2.04 (H) 0.44 - 1.00 mg/dL Final  11/27/2016 05:02 AM 2.02 (H) 0.44 - 1.00 mg/dL Final  11/26/2016 05:56 AM 1.89 (H) 0.44 - 1.00 mg/dL Final  11/25/2016 06:37 AM 1.83 (H) 0.44 - 1.00 mg/dL Final  11/24/2016 05:24 AM 1.83 (H) 0.44 - 1.00 mg/dL Final  11/23/2016 04:35 AM 1.96 (H) 0.44 - 1.00 mg/dL Final  11/22/2016 04:02 AM 2.00 (H) 0.44 - 1.00 mg/dL Final  11/21/2016 01:07 PM 1.93 (H) 0.44 - 1.00 mg/dL Final  04/05/2016 03:35 PM 2.00 (H) 0.44 - 1.00 mg/dL Final     PMHx:   Past Medical History:  Diagnosis Date  . Anemia   . Atrial fibrillation ([place])   . Cataract   . Cervical cancer ([place]) 1991   s/p hysterectomy  . Chronic cellulitis   . Chronic combined systolic and diastolic congestive heart failure, NYHA class 2 (HCC)    LVEF 25-30% with restrictive diastolic filling  . Degenerative joint disease   . Diverticulitis   . Essential hypertension   . GERD (gastroesophageal reflux disease)   . Gout   . Kidney stones   . Morbid obesity ([place])   . NICM (nonischemic cardiomyopathy) ([place]) 02/22/2017  . Osteoarthritis   . Permanent atrial fibrillation 05/04/2009   Qualifier: History of  By: Quentin Cornwall CMA, Janett Billow    . 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, Binnie Kand   . Sinoatrial node dysfunction [facility])    Status post PPM - Dr. Lovena Le  . Sleep apnea    CPAP    Past Surgical History:  Procedure Laterality Date  . ABDOMINAL HYSTERECTOMY  1991  . COLONOSCOPY  1998   one polyp per patient  . EYE  SURGERY    . PACEMAKER INSERTION  Nov 2000   St Jude with revision in 2011  . PERCUTANEOUS NEPHROLITHOTOMY  April 2012  . RIGHT HEART CATH N/A 06/28/2018   Procedure: RIGHT HEART CATH;  Surgeon: Jolaine Artist, MD;  Location: [facility];  Service: Cardiovascular;  Laterality: N/A;  . TEE WITHOUT CARDIOVERSION N/A 06/28/2018   Procedure: TRANSESOPHAGEAL ECHOCARDIOGRAM (TEE);  Surgeon: Jolaine Artist, MD;  Location: [facility];  Service: Cardiovascular;  Laterality: N/A;    Family Hx:  Family History  Problem Relation Age of Onset  . Heart attack Father   . Stroke Mother   . Diabetes Sister   . Colon cancer Other        seven family members  . Liver disease Neg Hx   . Other Neg Hx     Social History:  reports that she quit smoking about 39 years ago. Her smoking use included cigarettes. She smoked 1.00 pack per day. She has never used smokeless tobacco. She reports that she does not drink alcohol or use drugs.  Allergies:  Allergies  Allergen Reactions  . Cephalexin Shortness Of Breath  . Codeine Anaphylaxis    REACTION: throat swelling  . Contrast Media [Iodinated Diagnostic Agents] Other (See Comments)    Patient states "I have chronic kidney disease so the doctor said no dye in my veins."  . Peppermint Flavor Shortness Of Breath  . Prednisone Anaphylaxis, Shortness Of Breath and Swelling    REACTION: swelling, S.O.B.  . Tape Other (See Comments)    States plastic tape blisters her skin  . Ciprofloxacin Hives  . Latex Itching and Rash  . Penicillins Hives    DID THE REACTION INVOLVE: Swelling of the face/tongue/throat, SOB, or low BP? No Sudden or severe rash/hives, skin peeling, or the inside of the mouth or nose? Yes Did it require medical treatment? Pt was in hospital at the time of reaction When did it last happen?age - mid 40's If all above answers are "NO", may proceed with cephalosporin use.  . Coreg [Carvedilol] Other (See Comments)    Beta  Blockers cause her organs to shut down per patient  . Diamox [Acetazolamide] Rash    Rash after 2 doses of diamox     Medications: Prior to Admission medications   Medication Sig Start Date End Date Taking? Authorizing Provider  allopurinol (ZYLOPRIM) 300 MG tablet Take 300 mg by mouth every evening.    Yes [provider]  aspirin 325 MG tablet Take 325 mg by mouth at bedtime.    Yes [provider]  calcitRIOL (ROCALTROL) 0.25 MCG capsule Take 0.25 mcg by mouth every other day.    Yes [provider]  calcium carbonate (OS-CAL) 600 MG tablet Take 600 mg by mouth at bedtime.    Yes [provider]  COD LIVER OIL PO Take 1 capsule by mouth daily.    Yes [provider]  docusate sodium (COLACE) 100 MG capsule Take 100 mg by mouth daily as needed for mild constipation.    Yes [provider]  feeding supplement (BOOST HIGH PROTEIN) LIQD Take 1 Container by mouth 2 (two) times daily.    Yes [provider]  ferrous sulfate 325 (65 FE) MG tablet Take 325 mg by mouth at bedtime.    Yes [provider]  Glycerin, Adult, 2.1 g SUPP Place 1 suppository rectally daily as needed for moderate constipation. 02/22/17  Yes Barrett, Evelene Croon, PA-C  HYDROcodone-acetaminophen (NORCO) 10-325 MG per tablet Take 1 tablet by mouth See admin instructions. Take 1 tablet by mouth twice daily, may also take 1/2 tablet an additional 2 times during the day as needed for pain   Yes [provider]  isosorbide mononitrate (IMDUR) 30 MG 24 hr tablet Take 0.5 tablets (15 mg total) by mouth daily. Patient taking differently: Take 15 mg by mouth at bedtime.  12/10/17  Yes Fay Records, MD  Multiple Vitamin (  MULTIVITAMIN WITH MINERALS) TABS tablet Take 1 tablet by mouth at bedtime.   Yes [provider]  nystatin (MYCOSTATIN/NYSTOP) powder Apply 1 application topically daily.  02/05/17  Yes [provider]  polyethylene glycol  (MIRALAX / GLYCOLAX) packet Take 17 g by mouth at bedtime.   Yes [provider]  potassium chloride SA (K-DUR,KLOR-CON) 20 MEQ tablet Take 1 tablet (20 mEq total) by mouth 2 (two) times daily. Will need an additional 80 MEQ today 09/17/18 Patient taking differently: Take 30 mEq by mouth 4 (four) times daily.  09/17/18  Yes Bensimhon, Shaune Pascal, MD  PRESCRIPTION MEDICATION Inhale into the lungs at bedtime. Bi-Pap   Yes [provider]  torsemide (DEMADEX) 20 MG tablet Take 4 tablets (80 mg total) by mouth 2 (two) times daily. May also take 1 tablet (20 mg total) as needed (for weight greater than 246). 07/15/18  Yes Clegg, Amy D, NP  vitamin B-12 (CYANOCOBALAMIN) 500 MCG tablet Take 500 mcg by mouth daily.   Yes [provider]  nystatin-triamcinolone ointment (MYCOLOG) Apply topically 2 (two) times daily. Patient not taking: Reported on 09/23/2018 02/22/17   Barrett, Evelene Croon, PA-C    I have reviewed the patient's current medications.  Labs:  Results for orders placed or performed during the hospital encounter of 09/23/18 (from the past 48 hour(s))  CBC with Differential     Status: Abnormal   Collection Time: 09/23/18  7:30 PM  Result Value Ref Range   WBC 9.4 4.0 - 10.5 K/uL   RBC 1.74 (L) 3.87 - 5.11 MIL/uL   Hemoglobin 5.4 (LL) 12.0 - 15.0 g/dL    Comment: This critical result has verified and been called to P PEREZ,RN by Gerrie Nordmann on 12 30 2019 at 1953, and has been read back. READ BACK AND VERIFIED REPEATED TO VERIFY CORRECTED ON 12/30 AT 2106: PREVIOUSLY REPORTED AS 5.4 This critical result has verified and been called to P PEREZ,RN by Gerrie Nordmann on 12 30 2019 at 1953, and has been read back. READ BACK AND VERIFIED    HCT 18.1 (L) 36.0 - 46.0 %   MCV 104.0 (H) 80.0 - 100.0 fL   MCH 31.0 26.0 - 34.0 pg   MCHC 29.8 (L) 30.0 - 36.0 g/dL   RDW 15.6 (H) 11.5 - 15.5 %   Platelets 175 150 - 400 K/uL   nRBC 0.0 0.0 - 0.2 %   Neutrophils Relative % 76 %    Neutro Abs 7.1 1.7 - 7.7 K/uL   Lymphocytes Relative 14 %   Lymphs Abs 1.3 0.7 - 4.0 K/uL   Monocytes Relative 8 %   Monocytes Absolute 0.7 0.1 - 1.0 K/uL   Eosinophils Relative 2 %   Eosinophils Absolute 0.2 0.0 - 0.5 K/uL   Basophils Relative 0 %   Basophils Absolute 0.0 0.0 - 0.1 K/uL   Immature Granulocytes 0 %   Abs Immature Granulocytes 0.03 0.00 - 0.07 K/uL    Comment: Performed at [facility] [address] [place], [place] [address]  Comprehensive metabolic panel     Status: Abnormal   Collection Time: 09/23/18  7:30 PM  Result Value Ref Range   Sodium 130 (L) 135 - 145 mmol/L   Potassium 7.2 (HH) 3.5 - 5.1 mmol/L    Comment: NO VISIBLE HEMOLYSIS CRITICAL RESULT CALLED TO, READ BACK BY AND VERIFIED WITH: PEREZ,P RN @ 2016 09/23/18 LEONARD,A    Chloride 94 (L) 98 - 111 mmol/L  CO2 23 22 - 32 mmol/L   Glucose, Bld 123 (H) 70 - 99 mg/dL   BUN 153 (H) 8 - 23 mg/dL   Creatinine, Ser 4.32 (H) 0.44 - 1.00 mg/dL   Calcium 9.9 8.9 - 10.3 mg/dL   Total Protein 6.3 (L) 6.5 - 8.1 g/dL   Albumin 3.5 3.5 - 5.0 g/dL   AST 28 15 - 41 U/L   ALT 22 0 - 44 U/L   Alkaline Phosphatase 36 (L) 38 - 126 U/L   Total Bilirubin 0.6 0.3 - 1.2 mg/dL   GFR calc non Af Amer 10 (L) >60 mL/min   GFR calc Af Amer 11 (L) >60 mL/min   Anion gap 13 5 - 15    Comment: Performed at [facility] [address] [place], [place] [address]  I-Stat Troponin, ED - 0, 3, 6 hours (not at [facility])     Status: None   Collection Time: 09/23/18  7:42 PM  Result Value Ref Range   Troponin i, poc 0.03 0.00 - 0.08 ng/mL   Comment 3            Comment: Due to the release kinetics of cTnI, a negative result within the first hours of the onset of symptoms does not rule out myocardial infarction with certainty. If myocardial infarction is still suspected, repeat the test at appropriate intervals.   I-Stat Chem 8, ED     Status: Abnormal   Collection Time: 09/23/18  7:44 PM  Result Value Ref Range    Sodium 128 (L) 135 - 145 mmol/L   Potassium 7.1 (HH) 3.5 - 5.1 mmol/L   Chloride 96 (L) 98 - 111 mmol/L   BUN >140 (H) 8 - 23 mg/dL   Creatinine, Ser 4.40 (H) 0.44 - 1.00 mg/dL   Glucose, Bld 115 (H) 70 - 99 mg/dL   Calcium, Ion 1.25 1.15 - 1.40 mmol/L   TCO2 25 22 - 32 mmol/L   Hemoglobin 6.1 (LL) 12.0 - 15.0 g/dL   HCT 18.0 (L) 36.0 - 46.0 %   Comment NOTIFIED PHYSICIAN   I-Stat venous blood gas, ED     Status: Abnormal   Collection Time: 09/23/18  7:45 PM  Result Value Ref Range   pH, Ven 7.363 7.250 - 7.430   pCO2, Ven 47.0 44.0 - 60.0 mmHg   pO2, Ven 21.0 (LL) 32.0 - 45.0 mmHg   Bicarbonate 26.7 20.0 - 28.0 mmol/L   TCO2 28 22 - 32 mmol/L   O2 Saturation 31.0 %   Acid-Base Excess 1.0 0.0 - 2.0 mmol/L   Patient temperature HIDE    Sample type VENOUS    Comment NOTIFIED PHYSICIAN   Type and screen [place]     Status: None   Collection Time: 09/23/18  8:15 PM  Result Value Ref Range   ABO/RH(D) O POS    Antibody Screen NEG    Sample Expiration      09/26/2018 Performed at [facility], [address], [place], [place] [address]   POC occult blood, ED     Status: Abnormal   Collection Time: 09/23/18  8:50 PM  Result Value Ref Range   Fecal Occult Bld POSITIVE (A) NEGATIVE  CBG monitoring, ED     Status: Abnormal   Collection Time: 09/23/18  9:47 PM  Result Value Ref Range   Glucose-Capillary 104 (H) 70 - 99 mg/dL   Comment 1 Notify RN    Comment 2  Document in Chart   I-Stat Troponin, ED - 0, 3, 6 hours (not at [facility])     Status: None   Collection Time: 09/23/18  9:56 PM  Result Value Ref Range   Troponin i, poc 0.05 0.00 - 0.08 ng/mL   Comment 3            Comment: Due to the release kinetics of cTnI, a negative result within the first hours of the onset of symptoms does not rule out myocardial infarction with certainty. If myocardial infarction is still suspected, repeat the test at appropriate intervals.      ROS:  Pertinent  items noted in HPI and remainder of comprehensive ROS otherwise negative.  Physical Exam: Vitals:   09/23/18 2200 09/24/18 0000  BP: (!) 101/45 114/69  Pulse: 98   Resp: 19 18  Temp:  97.7 F (36.5 C)  SpO2: 100% 100%     General exam: Appears calm and comfortable  Respiratory system: Coarse breath sound b/l. Respiratory effort normal. Cardiovascular system: S1 & S2 heard, RRR, no rub.  Trace pedal edema. Gastrointestinal system: Abdomen is nondistended, soft and nontender. Normal bowel sounds heard. Central nervous system: Alert and oriented. No focal neurological deficits. Extremities: Symmetric 5 x 5 power. Chronic skin discoloration of LE Skin: No rashes, lesions or ulcers Psychiatry: Judgement and insight appear normal. Mood & affect appropriate.   Assessment/Plan:  # Hyperkalemia: Due to combination of recently increased dose of oral KCL, holding diuretics in underling CKD patient. EKG with Vpaced. She received medical treatment including insulin, dextrose, kayexalate and sodium bicarbonate. -Check stat BMP. If no improvement in serum potassium level, she will need urgent dialysis. It was discussed with the ICU team and the patient. Patient agreed with the plan.  --ordered lokelma, ca gluconate -monitor labs  #Acute kidney injury on CKD stage IV: Acute elevation in serum creatinine likely in the setting of diuretics.  She has very high BUN level with severe anemia is concerning for any underlying bleeding especially GI. -check UA -check bladder scan, renal US to r/o any obstruction -on IVF, holding diuretics. -monitor BMP, avoid nephrotoxins.  # Severe anemia: Significant drop in hb. I recommend evaluation for GI bleeding. Check Iron studies. She will need blood transfusion with close monitoring of serum potassium level.   # Hyponatremia: likely due to metolazone. Check urine Na, K and urine osmolality.   # Combined CHF: Looks euvolumic on exam.   # Hypotension: BP 90/68  currently. Holding imdur.    Lynden Flemmer Tanna Furry 09/24/2018, 12:46 AM  [employer].

## 2018-09-24 NOTE — Progress Notes (Signed)
Spoke with Levada Dy, RN from IV team who stated nephology would have to approve midline placement. Spoke with Dr. Royce Macadamia, MD who stated midline/PICC would not be appropriate for this pt. Spoke with Noe Gens, NP regarding situation; Plan for central line insertion as soon as possible.   Notified blood bank that pt has not received the 2nd PRBC and a new unit would be needed when central line is placed.

## 2018-09-24 NOTE — Progress Notes (Signed)
Patient is being followed by nephrology.  Spoke with Wynelle Link, RN regarding need for approval from nephrology.  [facility] will discuss with them and let us know if it is approved.  Carolee Rota, RN [facility]

## 2018-09-24 NOTE — Progress Notes (Signed)
Lost IV access while transfusing PRBC. Transfusion stopped. IV team unable to obtain PIV after multiple attempts. MD made aware. Order placed for midline. Will resume transfusion when midline established.

## 2018-09-24 NOTE — Progress Notes (Signed)
The follow up lab showed serum potassium level improved to 6 from 7.1 Cr 4.32, bun 152, co2 26.  I will discontinue sod bicarbonate drip and start NS. She has dry mouth and looks mildly dehydrated on exam.  Will hold off on dialysis, monitor lab.  -recommend evaluation for GI bleed.

## 2018-09-24 NOTE — Progress Notes (Signed)
[facility] Progress Note Patient Name: [name] [name] DOB: May 20, 1946 MRN: [id number]   Date of Service  09/24/2018  HPI/Events of Note  Patient with anemia in the setting of AoCRF with Hgb of 4.9 down from 5.8.  Also with potassium of 5.8  eICU Interventions  Plan: Transfuse 1 unit of pRBC for now given advanced renal failure and concern for potassium and volume overload.  Post-transfusion CBC and BMET Consider IHD and transfusion on dialysis     Intervention Category Major Interventions: Other: Intermediate Interventions: Electrolyte abnormality - evaluation and management  DETERDING,ELIZABETH 09/24/2018, 5:38 AM

## 2018-09-24 NOTE — Procedures (Signed)
Central Venous Catheter Insertion Procedure Note [name] [name] [id number] 1946-06-26  Procedure: Insertion of Central Venous Catheter Indications: Assessment of intravascular volume, Drug and/or fluid administration and Frequent blood sampling  Procedure Details Consent: Risks of procedure as well as the alternatives and risks of each were explained to the (patient/caregiver).  Consent for procedure obtained. Time Out: Verified patient identification, verified procedure, site/side was marked, verified correct patient position, special equipment/implants available, medications/allergies/relevent history reviewed, required imaging and test results available.  Performed  Maximum sterile technique was used including antiseptics, cap, gloves, gown, hand hygiene, mask and sheet. Skin prep: Chlorhexidine; local anesthetic administered A antimicrobial bonded/coated triple lumen catheter was placed in the right internal jugular vein using the Seldinger technique.  Evaluation Blood flow good Complications: No apparent complications Patient did tolerate procedure well. Chest X-ray ordered to verify placement.  CXR: pending.  Procedure performed under direct ultrasound guidance for real time vessel cannulation.      Montey Hora, [facility] Pager: [phone]  or [phone] 09/24/2018, 4:10 PM

## 2018-09-24 NOTE — Consult Note (Signed)
Referring Provider: PCCM Primary Care Physician:  [name], MD Primary Gastroenterologist:  Barney Drain, MD     Reason for Consultation:    Acute on chronic anemia    ASSESSMENT / PLAN:     72 yo female with multiple medical problems including cirrhosis followed by Dr. Oneida Alar in [place]. Currently admitted with worsening renal failure, acute on chronic macrocytic anemia / dark heme + stools (on iron).  She takes a full daily asa at home, no other NSAIDS. Endorses 3-4 month history of epistaxis but doubtful it caused this degree of anemia. No reason to suspect RP  -Patient may have an UGI bleed (portal gastropathy, varices?). She has refused endoscopies in the past , felt risk of death from sedation was too high so never screened for esophageal varices -When acute medical issues stabilize she may need EGD if agreeable. At this point it doesn't sound like she does. -she takes daily ASA so will start PPI empirically   -Blood transfusion in progress  -check INR  AKI on CKD. Nephrology following. HD being considered  HPI:      HPI: [name] [name] is a 72 y.o. 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  She is followed by Dr. Trinda Pascal (GI) and at the time of her last office visit in mid July of this year, liver disease was felt to be well compensated.   At baseline patient is hemoglobin is in the 10-11 range.  She presented to the ED yesterday with shortness of breath, weakness and chest pain.  Patient is seeing PCP, potassium level was found to be elevated, she was sent to the ED. Workup revealed a hemoglobin of 5.4, MCV 104.  Her potassium was~7 and she had had significant worsening of her renal function.  No acute findings on renal ultrasound study was significantly limited.  Patient takes iron so  stools are dark. She hasn't seen any bright red blood in stool. Over the weekend she developed generalized abdominal pain, projectile vomiting and diarrhea. Drank chocolate Ensure so emesis was brown. Symptoms lasted only a day or so. No one in family was ill.    Past Medical History:  Diagnosis Date  . Anemia   . Atrial fibrillation ([place])   . Cataract   . Cervical cancer ([place]) 1991   s/p hysterectomy  . Chronic cellulitis   . Chronic combined systolic and diastolic congestive heart failure, NYHA class 2 (H[facility])    LVEF 25-30% with restrictive diastolic filling  . Degenerative joint disease   . Diverticulitis   . Essential hypertension   . GERD (gastroesophageal reflux disease)   . Gout   . Kidney stones   . Morbid obesity ([place])   . NICM (nonischemic cardiomyopathy) ([place]) 02/22/2017  . Osteoarthritis   . Permanent atrial fibrillation 05/04/2009   Qualifier: History of  By: Quentin Cornwall CMA, Janett Billow    . 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 Sinoatrial node dysfunction [facility])    Status post PPM - Dr. Lovena Le  . Sleep apnea    CPAP    Past Surgical History:  Procedure Laterality Date  . ABDOMINAL HYSTERECTOMY  1991  . COLONOSCOPY  1998   one polyp per patient  . EYE SURGERY    . PACEMAKER INSERTION  Nov 2000   St Jude with revision in 2011  . PERCUTANEOUS NEPHROLITHOTOMY  April 2012  . RIGHT HEART CATH N/A 06/28/2018   Procedure: RIGHT HEART CATH;  Surgeon: Jolaine Artist, MD;  Location: [facility];  Service: Cardiovascular;  Laterality: N/A;  . TEE WITHOUT CARDIOVERSION N/A 06/28/2018   Procedure: TRANSESOPHAGEAL ECHOCARDIOGRAM (TEE);  Surgeon: Jolaine Artist, MD;  Location: [facility] ENDOSCOPY;  Service: Cardiovascular;  Laterality: N/A;    Prior to Admission medications   Medication Sig Start Date End Date Taking? Authorizing Provider  allopurinol (ZYLOPRIM) 300 MG tablet Take 300 mg by mouth  every evening.    Yes [provider]  aspirin 325 MG tablet Take 325 mg by mouth at bedtime.    Yes [provider]  calcitRIOL (ROCALTROL) 0.25 MCG capsule Take 0.25 mcg by mouth every other day.    Yes [provider]  calcium carbonate (OS-CAL) 600 MG tablet Take 600 mg by mouth at bedtime.    Yes [provider]  COD LIVER OIL PO Take 1 capsule by mouth daily.    Yes [provider]  docusate sodium (COLACE) 100 MG capsule Take 100 mg by mouth daily as needed for mild constipation.    Yes [provider]  feeding supplement (BOOST HIGH PROTEIN) LIQD Take 1 Container by mouth 2 (two) times daily.    Yes [provider]  ferrous sulfate 325 (65 FE) MG tablet Take 325 mg by mouth at bedtime.    Yes [provider]  Glycerin, Adult, 2.1 g SUPP Place 1 suppository rectally daily as needed for moderate constipation. 02/22/17  Yes Barrett, Evelene Croon, PA-C  HYDROcodone-acetaminophen (NORCO) 10-325 MG per tablet Take 1 tablet by mouth See admin instructions. Take 1 tablet by mouth twice daily, may also take 1/2 tablet an additional 2 times during the day as needed for pain   Yes [provider]  isosorbide mononitrate (IMDUR) 30 MG 24 hr tablet Take 0.5 tablets (15 mg total) by mouth daily. Patient taking differently: Take 15 mg by mouth at bedtime.  12/10/17  Yes Fay Records, MD  Multiple Vitamin (MULTIVITAMIN WITH MINERALS) TABS tablet Take 1 tablet by mouth at bedtime.   Yes [provider]  nystatin (MYCOSTATIN/NYSTOP) powder Apply 1 application topically daily.  02/05/17  Yes [provider]  polyethylene glycol (MIRALAX / GLYCOLAX) packet Take 17 g by mouth at bedtime.   Yes [provider]  potassium chloride SA (K-DUR,KLOR-CON) 20 MEQ tablet Take 1 tablet (20 mEq total) by mouth 2 (two) times daily. Will need an additional 80 MEQ today 09/17/18 Patient taking differently: Take 30 mEq by mouth  4 (four) times daily.  09/17/18  Yes Bensimhon, Shaune Pascal, MD  PRESCRIPTION MEDICATION Inhale into the lungs at bedtime. Bi-Pap   Yes [provider]  torsemide (DEMADEX) 20 MG tablet Take 4 tablets (80 mg total) by mouth 2 (two) times daily. May also take 1 tablet (20 mg total) as needed (for weight greater than 246). 07/15/18  Yes Clegg, Amy D, NP  vitamin B-12 (CYANOCOBALAMIN) 500 MCG tablet Take 500 mcg by mouth daily.   Yes [provider]  nystatin-triamcinolone ointment (MYCOLOG) Apply topically 2 (two) times daily. Patient not taking: Reported on 09/23/2018 02/22/17   Barrett, Evelene Croon, PA-C    Current Facility-Administered Medications  Medication Dose Route Frequency Provider Last Rate Last Dose  . 0.9 %  sodium chloride infusion (Manually program via Guardrails  IV Fluids)   Intravenous Once Margaretha Seeds, MD      . HYDROcodone-acetaminophen [facility]) 10-325 MG per tablet 1 tablet  1 tablet Oral Q6H PRN Deterding, Guadelupe Sabin, MD      . sodium chloride 0.9 % bolus 500 mL  500 mL Intravenous Once Margaretha Seeds, MD 250 mL/hr at 09/24/18 1051 500 mL at 09/24/18 1051  . sodium zirconium cyclosilicate (LOKELMA) packet 10 g  10 g Oral BID Margaretha Seeds, MD   10 g at 09/24/18 1043    Allergies as of 09/23/2018 - Review Complete 09/23/2018  Allergen Reaction Noted  . Cephalexin Shortness Of Breath 06/13/2016  . Codeine Anaphylaxis   . Contrast media [iodinated diagnostic agents] Other (See Comments) 10/30/2014  . Peppermint flavor Shortness Of Breath 11/21/2016  . Prednisone Anaphylaxis, Shortness Of Breath, and Swelling   . Tape Other (See Comments) 03/06/2013  . Ciprofloxacin Hives   . Latex Itching and Rash   . Penicillins Hives   . Coreg [carvedilol] Other (See Comments) 11/12/2014  . Diamox [acetazolamide] Rash 07/15/2018    Family History  Problem Relation Age of Onset  . Heart attack Father   . Stroke Mother   . Diabetes Sister   . Colon cancer  Other        seven family members  . Liver disease Neg Hx   . Other Neg Hx     Social History   Socioeconomic History  . Marital status: Widowed    Spouse name: Not on file  . Number of children: 1  . Years of education: Not on file  . Highest education level: Some college, no degree  Occupational History  . Not on file  Social Needs  . Financial resource strain: Not hard at all  . Food insecurity:    Worry: Never true    Inability: Never true  . Transportation needs:    Medical: No    Non-medical: No  Tobacco Use  . Smoking status: Former Smoker    Packs/day: 1.00    Types: Cigarettes    Last attempt to quit: 09/26/1979    Years since quitting: 39.0  . Smokeless tobacco: Never Used  Substance and Sexual Activity  . Alcohol use: No  . Drug use: No  . Sexual activity: Never  Lifestyle  . Physical activity:    Days per week: Not on file    Minutes per session: Not on file  . Stress: Not on file  Relationships  . Social connections:    Talks on phone: Not on file    Gets together: Not on file    Attends religious service: Not on file    Active member of club or organization: Not on file    Attends meetings of clubs or organizations: Not on file    Relationship status: Not on file  . Intimate partner violence:    Fear of current or ex partner: Not on file    Emotionally abused: Not on file    Physically abused: Not on file    Forced sexual activity: Not on file  Other Topics Concern  . Not on file  Social History Narrative  . Not on file    Review of Systems: All systems reviewed and negative except where noted in HPI.  Physical Exam: Vital signs in last 24 hours: Temp:  [97.6 F (36.4 C)-98.4 F (36.9 C)] 98.4 F (36.9 C) (12/31 1209) Pulse Rate:  [91-106] 98 (12/31 1209) Resp:  [18-27]  19 (12/31 1209) BP: (90-117)/(45-95) 117/52 (12/31 1100) SpO2:  [93 %-100 %] 97 % (12/31 1209) Weight:  [108.5 kg-109.3 kg] 108.5 kg (12/31 0141) Last BM Date:  09/23/18 General:   Alert, obese female in NAD Psych:  Pleasant, cooperative. Normal mood and affect. Eyes:  Pupils equal, sclera clear, no icterus.   . Ears:  Slightly HOH. Nose:  No deformity, discharge,  or lesions. Neck:  Supple; no masses Lungs:  Clear throughout to auscultation.   No wheezes, crackles, or rhonchi.  Heart:  Regular rate and rhythm; + murmur, + BLE edema Abdomen:  Soft, non-distended, nontender, BS active, + umbilical hernia.    Rectal:  Deferred  Msk:  Symmetrical without gross deformities. . Neurologic:  Alert and  oriented x4;  grossly normal neurologically. Skin:  Intact without significant lesions or rashes.   Intake/Output from previous day: 12/30 0701 - 12/31 0700 In: 77 [I.V.:17; Blood:30] Out: 400 [Urine:400] Intake/Output this shift: Total I/O In: 200 [I.V.:200] Out: 500 [Urine:500]  Lab Results: Recent Labs    09/23/18 1930 09/23/18 1944 09/24/18 0316 09/24/18 1052  WBC 9.4  --  6.8  --   HGB 5.4* 6.1* 4.9* 5.8*  HCT 18.1* 18.0* 15.8* 18.6*  PLT 175  --  149*  --    BMET Recent Labs    09/24/18 0021 09/24/18 0316 09/24/18 1052  NA 135 138 137  K 6.0* 5.8* 5.2*  CL 96* 98 98  CO2 26 27 28   GLUCOSE 65* 119* 97  BUN 152* 154* 144*  CREATININE 4.32* 4.27* 4.16*  CALCIUM 9.6 9.4 9.2   LFT Recent Labs    09/23/18 1930  PROT 6.3*  ALBUMIN 3.5  AST 28  ALT 22  ALKPHOS 36*  BILITOT 0.6     Studies/Results: Dg Chest 1 View  Result Date: 09/23/2018 CLINICAL DATA:  Tachycardia EXAM: CHEST  1 VIEW COMPARISON:  06/25/2018 FINDINGS: Left pacer remains in place, unchanged. Cardiomegaly with vascular congestion. Perihilar and lower lobe opacities likely reflect edema, improved since prior study. Improving right effusion with small residual right effusion. IMPRESSION: Cardiomegaly with vascular congestion and mild pulmonary edema, improving since prior study. Small right effusion, and also improving since prior study. Electronically  Signed   By: Rolm Baptise M.D.   On: 09/23/2018 20:00   US Renal  Result Date: 09/24/2018 CLINICAL DATA:  Acute onset of renal insufficiency. EXAM: RENAL / URINARY TRACT ULTRASOUND COMPLETE COMPARISON:  CT of the abdomen and pelvis from 04/05/2016 FINDINGS: Right Kidney: Renal measurements: 8.5 x 4.4 x 4.9 cm = volume: 93.1 mL. Increased renal parenchymal echogenicity is noted. No mass or hydronephrosis visualized. Left Kidney: Renal measurements: 7.7 x 4.5 x 6.3 cm = volume: 112.6 mL. Not well characterized due to overlying bowel gas and limitations in positioning. Increased renal parenchymal echogenicity is noted. No mass or hydronephrosis visualized. Bladder: Not visualized. IMPRESSION: Significantly limited study due to limitations in positioning and overlying bowel gas. No evidence of hydronephrosis. Increased renal parenchymal echogenicity may reflect medical renal disease. Electronically Signed   By: Garald Balding M.D.   On: 09/24/2018 01:53     Tye Savoy, NP-C @  09/24/2018, 12:21 PM

## 2018-09-24 NOTE — Progress Notes (Signed)
CRITICAL VALUE ALERT  Critical Value:  Troponin 0.05  Date & Time Notied:  09/24/18 05/04  Provider Notified: Warren Lacy  Orders Received/Actions taken: N/A

## 2018-09-25 DIAGNOSIS — K922 Gastrointestinal hemorrhage, unspecified: Secondary | ICD-10-CM

## 2018-09-25 LAB — CBC
HCT: 23 % — ABNORMAL LOW (ref 36.0–46.0)
HCT: 23 % — ABNORMAL LOW (ref 36.0–46.0)
HCT: 24.6 % — ABNORMAL LOW (ref 36.0–46.0)
HCT: 25 % — ABNORMAL LOW (ref 36.0–46.0)
HEMOGLOBIN: 7.6 g/dL — AB (ref 12.0–15.0)
Hemoglobin: 7.2 g/dL — ABNORMAL LOW (ref 12.0–15.0)
Hemoglobin: 7 g/dL — ABNORMAL LOW (ref 12.0–15.0)
Hemoglobin: 7.6 g/dL — ABNORMAL LOW (ref 12.0–15.0)
MCH: 30.3 pg (ref 26.0–34.0)
MCH: 29.7 pg (ref 26.0–34.0)
MCH: 30.3 pg (ref 26.0–34.0)
MCH: 29.6 pg (ref 26.0–34.0)
MCHC: 31.3 g/dL (ref 30.0–36.0)
MCHC: 30.4 g/dL (ref 30.0–36.0)
MCHC: 30.9 g/dL (ref 30.0–36.0)
MCHC: 30.4 g/dL (ref 30.0–36.0)
MCV: 96.6 fL (ref 80.0–100.0)
MCV: 97.5 fL (ref 80.0–100.0)
MCV: 98 fL (ref 80.0–100.0)
MCV: 97.3 fL (ref 80.0–100.0)
NRBC: 0 % (ref 0.0–0.2)
NRBC: 0 % (ref 0.0–0.2)
PLATELETS: 127 10*3/uL — AB (ref 150–400)
Platelets: 120 10*3/uL — ABNORMAL LOW (ref 150–400)
Platelets: 142 10*3/uL — ABNORMAL LOW (ref 150–400)
Platelets: 128 10*3/uL — ABNORMAL LOW (ref 150–400)
RBC: 2.38 MIL/uL — ABNORMAL LOW (ref 3.87–5.11)
RBC: 2.36 MIL/uL — ABNORMAL LOW (ref 3.87–5.11)
RBC: 2.51 MIL/uL — ABNORMAL LOW (ref 3.87–5.11)
RBC: 2.57 MIL/uL — AB (ref 3.87–5.11)
RDW: 17.2 % — ABNORMAL HIGH (ref 11.5–15.5)
RDW: 17.3 % — ABNORMAL HIGH (ref 11.5–15.5)
RDW: 17.4 % — ABNORMAL HIGH (ref 11.5–15.5)
RDW: 17 % — ABNORMAL HIGH (ref 11.5–15.5)
WBC: 5.7 10*3/uL (ref 4.0–10.5)
WBC: 5.3 10*3/uL (ref 4.0–10.5)
WBC: 6 10*3/uL (ref 4.0–10.5)
WBC: 6.2 10*3/uL (ref 4.0–10.5)
nRBC: 0 % (ref 0.0–0.2)
nRBC: 0 % (ref 0.0–0.2)

## 2018-09-25 LAB — BASIC METABOLIC PANEL
ANION GAP: 11 (ref 5–15)
BUN: 114 mg/dL — ABNORMAL HIGH (ref 8–23)
CO2: 27 mmol/L (ref 22–32)
Calcium: 8.7 mg/dL — ABNORMAL LOW (ref 8.9–10.3)
Chloride: 103 mmol/L (ref 98–111)
Creatinine, Ser: 3.43 mg/dL — ABNORMAL HIGH (ref 0.44–1.00)
GFR calc Af Amer: 15 mL/min — ABNORMAL LOW (ref >60)
GFR calc non Af Amer: 13 mL/min — ABNORMAL LOW (ref >60)
Glucose, Bld: 97 mg/dL (ref 70–99)
POTASSIUM: 3.4 mmol/L — AB (ref 3.5–5.1)
Sodium: 141 mmol/L (ref 135–145)

## 2018-09-25 MED ORDER — LEVOFLOXACIN IN D5W 500 MG/100ML IV SOLN
500.0000 mg | INTRAVENOUS | Status: DC
  Administered 2018-09-25: 500 mg via INTRAVENOUS
  Filled 2018-09-25 (×2): qty 100

## 2018-09-25 MED ORDER — POTASSIUM CHLORIDE CRYS ER 20 MEQ PO TBCR
40.0000 meq | EXTENDED_RELEASE_TABLET | Freq: Once | ORAL | Status: AC
  Administered 2018-09-25: 40 meq via ORAL
  Filled 2018-09-25: qty 2

## 2018-09-25 MED ORDER — SODIUM CHLORIDE 0.9 % IV SOLN
INTRAVENOUS | Status: DC
  Administered 2018-09-25: 09:00:00 via INTRAVENOUS

## 2018-09-25 NOTE — H&P (View-Only) (Signed)
Progress Note   Subjective  Chief Complaint: Acute on chronic anemia  This morning patient tells me she is very hungry as she has not eaten since 23 December.  She is eager to get something to eat if she is not going to have procedures today.  Denies any new complaints or concerns.   Objective   Vital signs in last 24 hours: Temp:  [97.6 F (36.4 C)-98.4 F (36.9 C)] 97.6 F (36.4 C) (01/01 0733) Pulse Rate:  [91-98] 91 (01/01 0020) Resp:  [18-36] 19 (01/01 0700) BP: (96-143)/(48-94) 136/72 (01/01 0700) SpO2:  [95 %-100 %] 100 % (01/01 0700) Weight:  [109.7 kg] 109.7 kg (01/01 0347) Last BM Date: 09/25/18 General:    Elderly white female in NAD Heart:  Regular rate and rhythm; no murmurs Lungs: Respirations even and unlabored, lungs CTA bilaterally Abdomen:  Soft, nontender and nondistended. Normal bowel sounds. Extremities:  Without edema. Neurologic:  Alert and oriented,  grossly normal neurologically. Psych:  Cooperative. Normal mood and affect.  Intake/Output from previous day: 12/31 0701 - 01/01 0700 In: 1750 [P.O.:270; I.V.:800; Blood:680] Out: 1875 [EBXID:5686] Intake/Output this shift: Total I/O In: 10 [I.V.:10] Out: -   Lab Results: Recent Labs    09/24/18 0316 09/24/18 1052 09/24/18 2059 09/25/18 0348  WBC 6.8  --  5.5 5.7  HGB 4.9* 5.8* 6.2* 7.2*  HCT 15.8* 18.6* 20.6* 23.0*  PLT 149*  --  113* 127*   BMET Recent Labs    09/24/18 0316 09/24/18 1052 09/24/18 1701  NA 138 137 139  K 5.8* 5.2* 4.4  CL 98 98 101  CO2 27 28 26   GLUCOSE 119* 97 96  BUN 154* 144* 139*  CREATININE 4.27* 4.16* 4.00*  CALCIUM 9.4 9.2 8.9   LFT Recent Labs    09/23/18 1930  PROT 6.3*  ALBUMIN 3.5  AST 28  ALT 22  ALKPHOS 36*  BILITOT 0.6   PT/INR Recent Labs    09/24/18 1701  LABPROT 15.2  INR 1.22    Studies/Results: Dg Chest 1 View  Result Date: 09/23/2018 CLINICAL DATA:  Tachycardia EXAM: CHEST  1 VIEW COMPARISON:  06/25/2018 FINDINGS:  Left pacer remains in place, unchanged. Cardiomegaly with vascular congestion. Perihilar and lower lobe opacities likely reflect edema, improved since prior study. Improving right effusion with small residual right effusion. IMPRESSION: Cardiomegaly with vascular congestion and mild pulmonary edema, improving since prior study. Small right effusion, and also improving since prior study. Electronically Signed   By: Rolm Baptise M.D.   On: 09/23/2018 20:00   US Renal  Result Date: 09/24/2018 CLINICAL DATA:  Acute onset of renal insufficiency. EXAM: RENAL / URINARY TRACT ULTRASOUND COMPLETE COMPARISON:  CT of the abdomen and pelvis from 04/05/2016 FINDINGS: Right Kidney: Renal measurements: 8.5 x 4.4 x 4.9 cm = volume: 93.1 mL. Increased renal parenchymal echogenicity is noted. No mass or hydronephrosis visualized. Left Kidney: Renal measurements: 7.7 x 4.5 x 6.3 cm = volume: 112.6 mL. Not well characterized due to overlying bowel gas and limitations in positioning. Increased renal parenchymal echogenicity is noted. No mass or hydronephrosis visualized. Bladder: Not visualized. IMPRESSION: Significantly limited study due to limitations in positioning and overlying bowel gas. No evidence of hydronephrosis. Increased renal parenchymal echogenicity may reflect medical renal disease. Electronically Signed   By: Garald Balding M.D.   On: 09/24/2018 01:53   Dg Chest Port 1 View  Result Date: 09/24/2018 CLINICAL DATA:  Encounter for central line placement. EXAM: PORTABLE CHEST  1 VIEW COMPARISON:  09/23/2018. FINDINGS: Massive cardiac enlargement. Single lead pacer unchanged. BILATERAL airspace opacities have considerably worsened since yesterday's radiograph with large RIGHT pleural effusion consistent with pulmonary edema. Central venous line placement from RIGHT IJ approach lies with its tip in the mid to distal SVC. No pneumothorax. IMPRESSION: 1. Satisfactory RIGHT IJ central venous line placement. 2. Worsening  pulmonary edema. 3. No pneumothorax. Electronically Signed   By: Staci Righter M.D.   On: 09/24/2018 16:33    Assessment / Plan:   Assessment: 1.  Acute on chronic microcytic anemia/dark heme positive stools (on iron): Follows with Dr. Oneida Alar in [place], admitted with worsening renal failure, takes daily aspirin at home, no other NSAIDs, 3 to 15-monthhistory of epistaxis 2.  AK I on CKD: Nephrology following, HD being considered  Plan: 1.  Will discuss timing of procedures with Dr. CBryan Lemma  If no plan for EGD today we will go ahead and start clear liquid diet. 2.  Continue to monitor hemoglobin with transfusion as needed 3.  Continue antibiotics in the setting of possible GI bleed and history of cirrhosis 4.  Please await any further recommendations from Dr. CBryan Lemmalater today.  Thank you for your kind consultation, we will continue to follow.   LOS: 2 days   JLevin Erp 09/25/2018, 11:25 AM

## 2018-09-25 NOTE — Progress Notes (Signed)
[facility] Progress Note   Subjective:   Transfused 3u pRBC yesterday. Central line placed. GI consult considering scope due to anemia + high dose PPI resumed. Hb has improved from 4.9 to 7.2.  Tentatively EGD today.  Rec'd lokelma and kayexelate, IVF for hyper K which is improved to 4.4.  Cr was 4.3 on admit, this AM 4.0.  Baseline is ~2.5.  I/Os yesterday 1750 / 1875  Objective Vitals:   09/25/18 0500 09/25/18 0600 09/25/18 0700 09/25/18 0733  BP: (!) 116/58 135/66 136/72   Pulse:      Resp: (!) 27 (!) 24 19   Temp:    97.6 F (36.4 C)  TempSrc:    Oral  SpO2: 99% 100% 100%   Weight:      Height:       Physical Exam General: chronically ill appearing but comfortable in chair Heart: RRR, no rub, III/VI syst murmur through precordium Lungs: normal WOB, clear Abdomen: soft, nondistended Extremities: chronic venous stasis changes, no edema Neuro: nonfocal, conversant  Additional Objective Labs: Basic Metabolic Panel: Recent Labs  Lab 09/24/18 0316 09/24/18 1052 09/24/18 1701  NA 138 137 139  K 5.8* 5.2* 4.4  CL 98 98 101  CO2 27 28 26   GLUCOSE 119* 97 96  BUN 154* 144* 139*  CREATININE 4.27* 4.16* 4.00*  CALCIUM 9.4 9.2 8.9   Liver Function Tests: Recent Labs  Lab 09/23/18 1930  AST 28  ALT 22  ALKPHOS 36*  BILITOT 0.6  PROT 6.3*  ALBUMIN 3.5   No results for input(s): LIPASE, AMYLASE in the last 168 hours. CBC: Recent Labs  Lab 09/23/18 1930  09/24/18 0316 09/24/18 1052 09/24/18 2059 09/25/18 0348  WBC 9.4  --  6.8  --  5.5 5.7  NEUTROABS 7.1  --   --   --   --   --   HGB 5.4*   < > 4.9* 5.8* 6.2* 7.2*  HCT 18.1*   < > 15.8* 18.6* 20.6* 23.0*  MCV 104.0*  --  100.6*  --  98.6 96.6  PLT 175  --  149*  --  113* 127*   < > = values in this interval not displayed.   Blood Culture    Component Value Date/Time   SDES BLOOD RIGHT WRIST 06/21/2018 0057   SPECREQUEST  06/21/2018 0057    BOTTLES DRAWN AEROBIC AND ANAEROBIC Blood  Culture results may not be optimal due to an excessive volume of blood received in culture bottles   CULT  06/21/2018 0057    NO GROWTH 5 DAYS Performed at [facility], [place] [address], [place], [place] [address]    REPTSTATUS 06/26/2018 FINAL 06/21/2018 0057    Cardiac Enzymes: Recent Labs  Lab 09/24/18 0316 09/24/18 1052 09/24/18 1701  TROPONINI 0.05* 0.06* 0.06*   CBG: Recent Labs  Lab 09/23/18 2147  GLUCAP 104*   Iron Studies:  Recent Labs    09/24/18 0316  IRON 14*  TIBC 406  FERRITIN 58   @[email or web address] @ Studies/Results: Dg Chest 1 View  Result Date: 09/23/2018 CLINICAL DATA:  Tachycardia EXAM: CHEST  1 VIEW COMPARISON:  06/25/2018 FINDINGS: Left pacer remains in place, unchanged. Cardiomegaly with vascular congestion. Perihilar and lower lobe opacities likely reflect edema, improved since prior study. Improving right effusion with small residual right effusion. IMPRESSION: Cardiomegaly with vascular congestion and mild pulmonary edema, improving since prior study. Small right effusion, and also improving since prior study. Electronically Signed   By:  Rolm Baptise M.D.   On: 09/23/2018 20:00   US Renal  Result Date: 09/24/2018 CLINICAL DATA:  Acute onset of renal insufficiency. EXAM: RENAL / URINARY TRACT ULTRASOUND COMPLETE COMPARISON:  CT of the abdomen and pelvis from 04/05/2016 FINDINGS: Right Kidney: Renal measurements: 8.5 x 4.4 x 4.9 cm = volume: 93.1 mL. Increased renal parenchymal echogenicity is noted. No mass or hydronephrosis visualized. Left Kidney: Renal measurements: 7.7 x 4.5 x 6.3 cm = volume: 112.6 mL. Not well characterized due to overlying bowel gas and limitations in positioning. Increased renal parenchymal echogenicity is noted. No mass or hydronephrosis visualized. Bladder: Not visualized. IMPRESSION: Significantly limited study due to limitations in positioning and overlying bowel gas. No evidence of hydronephrosis. Increased renal parenchymal  echogenicity may reflect medical renal disease. Electronically Signed   By: Garald Balding M.D.   On: 09/24/2018 01:53   Dg Chest Port 1 View  Result Date: 09/24/2018 CLINICAL DATA:  Encounter for central line placement. EXAM: PORTABLE CHEST 1 VIEW COMPARISON:  09/23/2018. FINDINGS: Massive cardiac enlargement. Single lead pacer unchanged. BILATERAL airspace opacities have considerably worsened since yesterday's radiograph with large RIGHT pleural effusion consistent with pulmonary edema. Central venous line placement from RIGHT IJ approach lies with its tip in the mid to distal SVC. No pneumothorax. IMPRESSION: 1. Satisfactory RIGHT IJ central venous line placement. 2. Worsening pulmonary edema. 3. No pneumothorax. Electronically Signed   By: Staci Righter M.D.   On: 09/24/2018 16:33   Medications:  . sodium chloride   Intravenous Once  . sodium chloride   Intravenous Once  . Chlorhexidine Gluconate Cloth  6 each Topical Daily  . pantoprazole (PROTONIX) IV  40 mg Intravenous Q12H  . sodium chloride flush  10-40 mL Intracatheter Q12H  . sodium zirconium cyclosilicate  10 g Oral BID  . THROMBI-PAD  1 each Topical Once    Assessment/Plan:  **AKI on CKD: Baseline Cr ~2.5-3 (was 3.1 on 12/23 outpatient) and was 4.3 on admission in setting of hypovolemia/diuretic use.  Renal US was limited study due to bowel gas but no obvious obstruction.  Elevated BUN could also be contributed by GI bleed.  AKI is stable to mildly improved with IVF.  Given NPO status and still appears a bit hypovolemic will give NS 75/hr x 12 hrs more.  Very judicious IVF given tenuous cardiac status.      **Anemia:  Likely multifactorial with large contribution of acute/subacute blood loss anemia.  She's rec'd 3u pRBC.  Scope per GI.  Has not previously required ESA.   **Hyperkalemia:  Resolved with K binders and IV fluids.  CTM daily K.   **Hyponatremia:  Hypovolemic.  Has resolved with hydration and holding diuretics.    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 There is 3+ MR and 4+ TR.  EF 40-45%.   IVF per above,  Continue to hold diuretics (outpt was torsemide 80 BID and metolazone 2.5 qThurs).  Caution as she is so tenuous.   **HTN: anti HTN on hold currently. Normotensive currently. On imdur 15 alone outpt.  **OSA: bipap.    Jannifer Hick MD 09/25/2018, 8:09 AM  [facility] Pager: [phone]

## 2018-09-25 NOTE — Progress Notes (Signed)
Progress Note   Subjective  Chief Complaint: Acute on chronic anemia  This morning patient tells me she is very hungry as she has not eaten since 23 December.  She is eager to get something to eat if she is not going to have procedures today.  Denies any new complaints or concerns.   Objective   Vital signs in last 24 hours: Temp:  [97.6 F (36.4 C)-98.4 F (36.9 C)] 97.6 F (36.4 C) (01/01 0733) Pulse Rate:  [91-98] 91 (01/01 0020) Resp:  [18-36] 19 (01/01 0700) BP: (96-143)/(48-94) 136/72 (01/01 0700) SpO2:  [95 %-100 %] 100 % (01/01 0700) Weight:  [109.7 kg] 109.7 kg (01/01 0347) Last BM Date: 09/25/18 General:    Elderly white female in NAD Heart:  Regular rate and rhythm; no murmurs Lungs: Respirations even and unlabored, lungs CTA bilaterally Abdomen:  Soft, nontender and nondistended. Normal bowel sounds. Extremities:  Without edema. Neurologic:  Alert and oriented,  grossly normal neurologically. Psych:  Cooperative. Normal mood and affect.  Intake/Output from previous day: 12/31 0701 - 01/01 0700 In: 1750 [P.O.:270; I.V.:800; Blood:680] Out: 1875 [APOLI:1030] Intake/Output this shift: Total I/O In: 10 [I.V.:10] Out: -   Lab Results: Recent Labs    09/24/18 0316 09/24/18 1052 09/24/18 2059 09/25/18 0348  WBC 6.8  --  5.5 5.7  HGB 4.9* 5.8* 6.2* 7.2*  HCT 15.8* 18.6* 20.6* 23.0*  PLT 149*  --  113* 127*   BMET Recent Labs    09/24/18 0316 09/24/18 1052 09/24/18 1701  NA 138 137 139  K 5.8* 5.2* 4.4  CL 98 98 101  CO2 27 28 26   GLUCOSE 119* 97 96  BUN 154* 144* 139*  CREATININE 4.27* 4.16* 4.00*  CALCIUM 9.4 9.2 8.9   LFT Recent Labs    09/23/18 1930  PROT 6.3*  ALBUMIN 3.5  AST 28  ALT 22  ALKPHOS 36*  BILITOT 0.6   PT/INR Recent Labs    09/24/18 1701  LABPROT 15.2  INR 1.22    Studies/Results: Dg Chest 1 View  Result Date: 09/23/2018 CLINICAL DATA:  Tachycardia EXAM: CHEST  1 VIEW COMPARISON:  06/25/2018 FINDINGS:  Left pacer remains in place, unchanged. Cardiomegaly with vascular congestion. Perihilar and lower lobe opacities likely reflect edema, improved since prior study. Improving right effusion with small residual right effusion. IMPRESSION: Cardiomegaly with vascular congestion and mild pulmonary edema, improving since prior study. Small right effusion, and also improving since prior study. Electronically Signed   By: Rolm Baptise M.D.   On: 09/23/2018 20:00   US Renal  Result Date: 09/24/2018 CLINICAL DATA:  Acute onset of renal insufficiency. EXAM: RENAL / URINARY TRACT ULTRASOUND COMPLETE COMPARISON:  CT of the abdomen and pelvis from 04/05/2016 FINDINGS: Right Kidney: Renal measurements: 8.5 x 4.4 x 4.9 cm = volume: 93.1 mL. Increased renal parenchymal echogenicity is noted. No mass or hydronephrosis visualized. Left Kidney: Renal measurements: 7.7 x 4.5 x 6.3 cm = volume: 112.6 mL. Not well characterized due to overlying bowel gas and limitations in positioning. Increased renal parenchymal echogenicity is noted. No mass or hydronephrosis visualized. Bladder: Not visualized. IMPRESSION: Significantly limited study due to limitations in positioning and overlying bowel gas. No evidence of hydronephrosis. Increased renal parenchymal echogenicity may reflect medical renal disease. Electronically Signed   By: Garald Balding M.D.   On: 09/24/2018 01:53   Dg Chest Port 1 View  Result Date: 09/24/2018 CLINICAL DATA:  Encounter for central line placement. EXAM: PORTABLE CHEST  1 VIEW COMPARISON:  09/23/2018. FINDINGS: Massive cardiac enlargement. Single lead pacer unchanged. BILATERAL airspace opacities have considerably worsened since yesterday's radiograph with large RIGHT pleural effusion consistent with pulmonary edema. Central venous line placement from RIGHT IJ approach lies with its tip in the mid to distal SVC. No pneumothorax. IMPRESSION: 1. Satisfactory RIGHT IJ central venous line placement. 2. Worsening  pulmonary edema. 3. No pneumothorax. Electronically Signed   By: Staci Righter M.D.   On: 09/24/2018 16:33    Assessment / Plan:   Assessment: 1.  Acute on chronic microcytic anemia/dark heme positive stools (on iron): Follows with Dr. Oneida Alar in [place], admitted with worsening renal failure, takes daily aspirin at home, no other NSAIDs, 3 to 70-monthhistory of epistaxis 2.  AK I on CKD: Nephrology following, HD being considered  Plan: 1.  Will discuss timing of procedures with Dr. CBryan Lemma  If no plan for EGD today we will go ahead and start clear liquid diet. 2.  Continue to monitor hemoglobin with transfusion as needed 3.  Continue antibiotics in the setting of possible GI bleed and history of cirrhosis 4.  Please await any further recommendations from Dr. CBryan Lemmalater today.  Thank you for your kind consultation, we will continue to follow.   LOS: 2 days   JLevin Erp 09/25/2018, 11:25 AM

## 2018-09-25 NOTE — Anesthesia Preprocedure Evaluation (Addendum)
Anesthesia Evaluation  Patient identified by MRN, date of birth, ID band Patient awake    Reviewed: Allergy & Precautions, NPO status , Patient's Chart, lab work & pertinent test results  Airway Mallampati: III  TM Distance: >3 FB Neck ROM: Full    Dental  (+) Dental Advisory Given, Poor Dentition,    Pulmonary sleep apnea and Continuous Positive Airway Pressure Ventilation , former smoker,    Pulmonary exam normal breath sounds clear to auscultation       Cardiovascular hypertension, Pt. on medications +CHF  Normal cardiovascular exam+ dysrhythmias Atrial Fibrillation + pacemaker (SA node dysfunction s/p PPM)  Rhythm:Regular Rate:Normal  TEE 10/19: EF 40-45%, septal dyssynchrony due to RV pacing, very mild AS, moderate/severe MR, massive LAE, massive RAE, small PFO, findings suggestive of restrictive cardiomyopathy     Neuro/Psych negative neurological ROS     GI/Hepatic GERD  ,(+) Cirrhosis  (NA[facility])      ,   Endo/Other  negative endocrine ROS  Renal/GU Renal Insufficiency and ARFRenal diseaseCr 3.43 on 09/25/18     Musculoskeletal  (+) Arthritis ,   Abdominal   Peds  Hematology  (+) anemia , Hgb 7.6 on 09/25/18   Anesthesia Other Findings Day of surgery medications reviewed with the patient.  Reproductive/Obstetrics                            Anesthesia Physical Anesthesia Plan  ASA: III  Anesthesia Plan: MAC   Post-op Pain Management:    Induction:   PONV Risk Score and Plan: 2 and Treatment may vary due to age or medical condition and Propofol infusion  Airway Management Planned: Natural Airway and Simple Face Mask  Additional Equipment:   Intra-op Plan:   Post-operative Plan:   Informed Consent: I have reviewed the patients History and Physical, chart, labs and discussed the procedure including the risks, benefits and alternatives for the proposed anesthesia with the patient  or authorized representative who has indicated his/her understanding and acceptance.   Dental advisory given  Plan Discussed with: CRNA  Anesthesia Plan Comments:        Anesthesia Quick Evaluation

## 2018-09-25 NOTE — Progress Notes (Signed)
PULMONARY / CRITICAL CARE MEDICINE Progress Note  NAME:  [name] [name], MRN:  [id number], DOB:  07-Aug-1946, LOS: 2 ADMISSION DATE:  09/23/2018, CONSULTATION DATE: 09/23/2018 REFERRING MD: Dr. Jeanell Sparrow, CHIEF COMPLAINT: Hyperkalemia  BRIEF HISTORY:    [name] is a 73 year old female with stage IV CKD, systolic heart failure with severe mitral regurgitation, pulmonary hypertension and obstructive sleep apnea who presents with severe hyperkalemia and acute blood loss anemia.  She has been treated with Lokelma, Kayexalate and IV fluids.  PCCM consulted for severe electrolyte abnormalities and work-up of acute blood loss anemia.  SIGNIFICANT PAST MEDICAL HISTORY   -Chronic kidney disease stage IV -Systolic congestive heart failure -Severe mitral regurgitation -Pulmonary  hypertension -Morbid obesity -Obstructive sleep apnea -Afib s/p AV nodal ablation and PPM  SIGNIFICANT EVENTS:  12/31 admitted  STUDIES:    CULTURES:    ANTIBIOTICS:  Levofloxacin 1/1>>  LINES/TUBES:  RIJ CVC 12/31>>  CONSULTANTS:  Nephrology   SUBJECTIVE:  She complains of hunger.  However understands that she needs to be evaluated for GI bleed.  Denies chest pain or shortness of breath.  We discussed needing antibiotics for her GI bleed.  She reports allergy to ceftriaxone because of shortness of breath, also states that ciprofloxacin has caused hives.  CONSTITUTIONAL: BP 97/62   Pulse 91   Temp 97.7 F (36.5 C) (Oral)   Resp 17   Ht 5' 6"  (1.676 m)   Wt 109.7 kg   SpO2 100%   BMI 39.03 kg/m   I/O last 3 completed shifts: In: 3846 [P.O.:270; I.V.:817; Blood:710] Out: 2275 [Urine:2275]         Physical Exam: General: Frail appearing female laying in bed, no acute distress HENT: , AT, OP clear, dry mucous membranes Eyes: EOMI, no scleral icterus Respiratory: Faint bibasilar crackles.  No crackles, wheezing or rales Cardiovascular: RRR, -M/R/G, no JVD GI: BS+, soft,  nontender Extremities:-Edema,-tenderness Neuro: AAO x4, CNII-XII grossly intact Skin: Venous insufficiency in the lower extremities Psych: Irritable, normal mood, normal affect GU: Foley in place  RESOLVED PROBLEM LIST   ASSESSMENT AND PLAN    Acute blood loss anemia Hx Iron Deficiency Anemia S/p 3U PRBC.  Hg 4.9> 5.8> 6.2> 7.2.  Currently hemodynamically stable -Trend CBC every 6 hours -We will transfuse for goal Hg >7 -Levofloxacin in setting of GI bleed in patient with cirrhosis (allergy to ceftriaxone and Cipro) -GI following.  Tentative plan for EGD tomorrow  Hyperkalemia - resolved S/p calcium gluconate, insulin, Lokelma and Kayexalate with improvement. EKG - V-paced - S/p Lokelma 10 mg BID x 48 hours -Trend BMP -Hold home potassium -Replete K for goal 3.5-4  Acute on Chronic Kidney Injury Stage IV CKD Baseline Cr 2.5-3. Renal US with no evidence of hydronephrosis. Concerned for dehydration.  UOP remains adequate -Discontinue IV fluids for worsening pulmonary edema -Trend BMP -Foley in place. Monitor UOP -Nephrology following  Chronic systolic and diastolic CHF Severe MR/TR Pulmonary HTN Followed by Dr. Haroldine Laws in [facility]. EF 40-45% on 06/20/18 -Hold on diuresis   Afib s/p AV nodal ablation and PPM Chronic RV pacing in 90s. Patient declined anticoagulation -Hold ASA since admission for GIB  OSA -Continue BiPAP nightly   SUMMARY OF TODAY'S PLAN:  Serial BMPs Trend CBC Transfuse with goal Hg >7  Best Practice / Goals of Care / Disposition.   DVT: none in setting of GIB SUP: None NUTRITION: CLD now, NPO at midnight MOBILITY: Bedrest GOALS OF CARE: Full code FAMILY DISCUSSIONS: No  family at bedside DISPOSITION ICU admission  LABS  Glucose Recent Labs  Lab 09/23/18 2147  GLUCAP 104*    BMET Recent Labs  Lab 09/24/18 1052 09/24/18 1701 09/25/18 1123  NA 137 139 141  K 5.2* 4.4 3.4*  CL 98 101 103  CO2 28 26 27   BUN 144*  139* 114*  CREATININE 4.16* 4.00* 3.43*  GLUCOSE 97 96 97    Liver Enzymes Recent Labs  Lab 09/23/18 1930  AST 28  ALT 22  ALKPHOS 36*  BILITOT 0.6  ALBUMIN 3.5    Electrolytes Recent Labs  Lab 09/24/18 1052 09/24/18 1701 09/25/18 1123  CALCIUM 9.2 8.9 8.7*    CBC Recent Labs  Lab 09/24/18 2059 09/25/18 0348 09/25/18 1123  WBC 5.5 5.7 5.3  HGB 6.2* 7.2* 7.0*  HCT 20.6* 23.0* 23.0*  PLT 113* 127* 120*    ABG No results for input(s): PHART, PCO2ART, PO2ART in the last 168 hours.  Coag's Recent Labs  Lab 09/24/18 1701  INR 1.22    Sepsis Markers No results for input(s): LATICACIDVEN, PROCALCITON, O2SATVEN in the last 168 hours.  Cardiac Enzymes Recent Labs  Lab 09/24/18 0316 09/24/18 1052 09/24/18 1701  TROPONINI 0.05* 0.06* 0.06*    The patient is critically ill with multiple organ systems failure and requires high complexity decision making for assessment and support, frequent evaluation and titration of therapies, application of advanced monitoring technologies and extensive interpretation of multiple databases.   Critical Care Time devoted to patient care services described in this note is 35 Minutes. This time reflects time of care of this signee Dr. Rodman Pickle. This critical care time does not reflect procedure time, or teaching time or supervisory time of PA/NP/Med student/Med Resident etc but could involve care discussion time.  Rodman Pickle, M.D. [facility] Pulmonary/Critical Care Medicine. Pager: [phone]. After hours pager: [phone].

## 2018-09-26 ENCOUNTER — Encounter (HOSPITAL_COMMUNITY): Payer: Self-pay | Admitting: *Deleted

## 2018-09-26 ENCOUNTER — Encounter (HOSPITAL_COMMUNITY)
Admission: EM | Disposition: A | Discharge status: Home / Self Care | Payer: Self-pay | Source: Home / Self Care | Attending: Pulmonary Disease

## 2018-09-26 ENCOUNTER — Inpatient Hospital Stay (HOSPITAL_COMMUNITY): Payer: Medicare Other | Admitting: Anesthesiology

## 2018-09-26 DIAGNOSIS — K2970 Gastritis, unspecified, without bleeding: Secondary | ICD-10-CM

## 2018-09-26 DIAGNOSIS — E876 Hypokalemia: Secondary | ICD-10-CM

## 2018-09-26 DIAGNOSIS — K2990 Gastroduodenitis, unspecified, without bleeding: Secondary | ICD-10-CM

## 2018-09-26 HISTORY — PX: ESOPHAGOGASTRODUODENOSCOPY (EGD) WITH PROPOFOL: SHX5813

## 2018-09-26 HISTORY — PX: BIOPSY: SHX5522

## 2018-09-26 LAB — BASIC METABOLIC PANEL
ANION GAP: 9 (ref 5–15)
Anion gap: 9 (ref 5–15)
BUN: 92 mg/dL — AB (ref 8–23)
BUN: 82 mg/dL — ABNORMAL HIGH (ref 8–23)
CO2: 29 mmol/L (ref 22–32)
CO2: 27 mmol/L (ref 22–32)
Calcium: 8.5 mg/dL — ABNORMAL LOW (ref 8.9–10.3)
Calcium: 8.5 mg/dL — ABNORMAL LOW (ref 8.9–10.3)
Chloride: 103 mmol/L (ref 98–111)
Chloride: 104 mmol/L (ref 98–111)
Creatinine, Ser: 3.18 mg/dL — ABNORMAL HIGH (ref 0.44–1.00)
Creatinine, Ser: 3.06 mg/dL — ABNORMAL HIGH (ref 0.44–1.00)
GFR calc Af Amer: 16 mL/min — ABNORMAL LOW (ref >60)
GFR calc Af Amer: 17 mL/min — ABNORMAL LOW (ref >60)
GFR calc non Af Amer: 14 mL/min — ABNORMAL LOW (ref >60)
GFR, EST NON AFRICAN AMERICAN: 14 mL/min — AB (ref >60)
GLUCOSE: 97 mg/dL (ref 70–99)
Glucose, Bld: 186 mg/dL — ABNORMAL HIGH (ref 70–99)
POTASSIUM: 4.2 mmol/L (ref 3.5–5.1)
Potassium: 3.2 mmol/L — ABNORMAL LOW (ref 3.5–5.1)
Sodium: 141 mmol/L (ref 135–145)
Sodium: 140 mmol/L (ref 135–145)

## 2018-09-26 LAB — CBC
HCT: 23.3 % — ABNORMAL LOW (ref 36.0–46.0)
HCT: 23.7 % — ABNORMAL LOW (ref 36.0–46.0)
HEMATOCRIT: 23.6 % — AB (ref 36.0–46.0)
HEMATOCRIT: 24 % — AB (ref 36.0–46.0)
HEMOGLOBIN: 7 g/dL — AB (ref 12.0–15.0)
Hemoglobin: 7.1 g/dL — ABNORMAL LOW (ref 12.0–15.0)
Hemoglobin: 7.1 g/dL — ABNORMAL LOW (ref 12.0–15.0)
Hemoglobin: 7.2 g/dL — ABNORMAL LOW (ref 12.0–15.0)
MCH: 30.3 pg (ref 26.0–34.0)
MCH: 29.5 pg (ref 26.0–34.0)
MCH: 29.2 pg (ref 26.0–34.0)
MCH: 29.8 pg (ref 26.0–34.0)
MCHC: 30.5 g/dL (ref 30.0–36.0)
MCHC: 30.1 g/dL (ref 30.0–36.0)
MCHC: 29.5 g/dL — ABNORMAL LOW (ref 30.0–36.0)
MCHC: 30 g/dL (ref 30.0–36.0)
MCV: 99.6 fL (ref 80.0–100.0)
MCV: 97.9 fL (ref 80.0–100.0)
MCV: 98.8 fL (ref 80.0–100.0)
MCV: 99.2 fL (ref 80.0–100.0)
NRBC: 0 % (ref 0.0–0.2)
PLATELETS: 106 10*3/uL — AB (ref 150–400)
Platelets: 128 10*3/uL — ABNORMAL LOW (ref 150–400)
Platelets: 148 10*3/uL — ABNORMAL LOW (ref 150–400)
Platelets: 134 10*3/uL — ABNORMAL LOW (ref 150–400)
RBC: 2.34 MIL/uL — ABNORMAL LOW (ref 3.87–5.11)
RBC: 2.41 MIL/uL — ABNORMAL LOW (ref 3.87–5.11)
RBC: 2.4 MIL/uL — ABNORMAL LOW (ref 3.87–5.11)
RBC: 2.42 MIL/uL — ABNORMAL LOW (ref 3.87–5.11)
RDW: 17 % — ABNORMAL HIGH (ref 11.5–15.5)
RDW: 16.7 % — ABNORMAL HIGH (ref 11.5–15.5)
RDW: 16.9 % — ABNORMAL HIGH (ref 11.5–15.5)
RDW: 17.4 % — ABNORMAL HIGH (ref 11.5–15.5)
WBC: 5 10*3/uL (ref 4.0–10.5)
WBC: 5.5 10*3/uL (ref 4.0–10.5)
WBC: 5.4 10*3/uL (ref 4.0–10.5)
WBC: 5.4 10*3/uL (ref 4.0–10.5)
nRBC: 0 % (ref 0.0–0.2)
nRBC: 0 % (ref 0.0–0.2)
nRBC: 0 % (ref 0.0–0.2)

## 2018-09-26 LAB — MAGNESIUM: Magnesium: 2.7 mg/dL — ABNORMAL HIGH (ref 1.7–2.4)

## 2018-09-26 LAB — PHOSPHORUS: PHOSPHORUS: 4.5 mg/dL (ref 2.5–4.6)

## 2018-09-26 SURGERY — ESOPHAGOGASTRODUODENOSCOPY (EGD) WITH PROPOFOL
Anesthesia: Monitor Anesthesia Care

## 2018-09-26 MED ORDER — PROPOFOL 10 MG/ML IV BOLUS
INTRAVENOUS | Status: DC | PRN
  Administered 2018-09-26: 30 mg via INTRAVENOUS

## 2018-09-26 MED ORDER — TORSEMIDE 20 MG PO TABS
80.0000 mg | ORAL_TABLET | Freq: Two times a day (BID) | ORAL | Status: DC
  Administered 2018-09-26 – 2018-09-29 (×6): 80 mg via ORAL
  Filled 2018-09-26 (×7): qty 4

## 2018-09-26 MED ORDER — PROPOFOL 500 MG/50ML IV EMUL
INTRAVENOUS | Status: DC | PRN
  Administered 2018-09-26: 100 ug/kg/min via INTRAVENOUS

## 2018-09-26 MED ORDER — POTASSIUM CHLORIDE CRYS ER 20 MEQ PO TBCR
40.0000 meq | EXTENDED_RELEASE_TABLET | Freq: Two times a day (BID) | ORAL | Status: DC
  Administered 2018-09-27: 40 meq via ORAL
  Filled 2018-09-26 (×2): qty 2

## 2018-09-26 MED ORDER — ALLOPURINOL 300 MG PO TABS
300.0000 mg | ORAL_TABLET | Freq: Every day | ORAL | Status: DC
  Administered 2018-09-26 – 2018-09-29 (×4): 300 mg via ORAL
  Filled 2018-09-26 (×4): qty 1

## 2018-09-26 MED ORDER — SODIUM CHLORIDE 0.9 % IV SOLN
INTRAVENOUS | Status: DC | PRN
  Administered 2018-09-26: 08:00:00 via INTRAVENOUS

## 2018-09-26 SURGICAL SUPPLY — 15 items

## 2018-09-26 NOTE — Progress Notes (Signed)
Pt placed on Home CPAP at this time with FFM and 2L O2 bled in. Pt tolerating well, RT will continue to monitor.

## 2018-09-26 NOTE — Progress Notes (Signed)
PULMONARY / CRITICAL CARE MEDICINE Progress Note  NAME:  [name] [name], MRN:  [id number], DOB:  March 12, 1946, LOS: 3 ADMISSION DATE:  09/23/2018, CONSULTATION DATE: 09/23/2018 REFERRING MD: Dr. Jeanell Sparrow, CHIEF COMPLAINT: Hyperkalemia  BRIEF HISTORY:    [name] is a 72 year old female with stage IV CKD, systolic heart failure with severe mitral regurgitation, pulmonary hypertension and obstructive sleep apnea who presents with severe hyperkalemia and acute blood loss anemia.  She has been treated with Lokelma, Kayexalate and IV fluids.  PCCM consulted for severe electrolyte abnormalities and work-up of acute blood loss anemia.  SIGNIFICANT PAST MEDICAL HISTORY   -Chronic kidney disease stage IV -Systolic congestive heart failure -Severe mitral regurgitation -Pulmonary  hypertension -Morbid obesity -Obstructive sleep apnea  -Afib s/p AV nodal ablation and PPM  SIGNIFICANT EVENTS:  12/31 admitted  STUDIES:    CULTURES:    ANTIBIOTICS:  Levofloxacin 1/1>>  LINES/TUBES:  RIJ CVC 12/31>>  CONSULTANTS:  Nephrology   SUBJECTIVE:  [name] is doing well today, no acute events overnight. She reports that her abdominal pain has improved, she tolerated the EGD well. Denies any chest pain or shortness of breath. We discussed the resulted of the EGD and the need to continue the protonix for the next 8 weeks and the need to follow up with gastroenterology.   CONSTITUTIONAL: BP (!) 142/64   Pulse (!) 104   Temp 98.2 F (36.8 C) (Oral)   Resp 20   Ht 5' 6"  (1.676 m)   Wt 109.1 kg   SpO2 100%   BMI 38.82 kg/m   I/O last 3 completed shifts: In: 5329 [P.O.:240; I.V.:461; Blood:620; IV Piggyback:100] Out: 9242 [Urine:1675]      Physical Exam: General: Frail appearing female laying in bed, no acute distress HENT: [place], AT, OP clear, dry mucous membranes Eyes: EOMI, no scleral icterus Respiratory: Lungs CTA, no crackles, wheezing or rales Cardiovascular: RRR, -M/R/G, no  JVD GI: BS+, soft, nontender Extremities:-Edema,-tenderness Neuro: AAO x4, CNII-XII grossly intact Skin: Venous insufficiency in the lower extremities Psych: Irritable, normal mood, normal affect GU: Foley in place  RESOLVED PROBLEM LIST   ASSESSMENT AND PLAN    Acute blood loss anemia Hx Iron Deficiency Anemia S/p 3U PRBC.  Hg 4.9> 5.8> 6.2> 7.2>>7.1.  She has not required any transfusions for 1 day. Denies any active GI bleeding. Currently hemodynamically stable. Patient is currently on protonix 40 mg BID. EGD showed no active bleeding.   -Trend CBC every 6 hours -Transfuse for goal Hg >7 -Levofloxacin in setting of GI bleed in patient with cirrhosis (allergy to ceftriaxone and Cipro) -GI following. EGD showed normal esophagus, with erosive gastritis. They recommend continuing protonix 40 mg BID for 8 weeks. Hold ASA and avoid NSAIDS. They will discuss colonoscopy +/- VCE with patient.   -F/u EGD biopsies  Hypokalemia - Initially hyperkalemic. S/p calcium gluconate, insulin, Lokelma and Kayexalate with improvement. EKG - V-paced./ S/p Lokelma 10 mg BID x 48 hours. Today she is hypokalemic to 3.2, replaced with K-dur.  -Trend BMP -Hold home potassium -Replete K for goal 3.5-4  Acute on Chronic Kidney Injury Stage IV CKD Improving, today her Cr improved to 3.18 from 3.43. Baseline Cr 2.5-3. Renal US with no evidence of hydronephrosis. Concerned for dehydration.  UOP remains net even.  -Trend BMP  -Foley in place. Monitor UOP -Nephrology following  Chronic systolic and diastolic CHF Severe MR/TR Pulmonary HTN Followed by Dr. Haroldine Laws in [facility]. EF 40-45% on 06/20/18 -Hold on diuresis  Afib s/p AV nodal ablation and PPM Chronic RV pacing in 90s. Patient declined anticoagulation -Hold ASA since admission for GIB, continue to hold ASA  OSA -Continue BiPAP nightly   SUMMARY OF TODAY'S PLAN:  Rechecking BMP, replete K as needed Restart CLD  Continue  levequin Protonix 40 mg BID Discontinue central line Transfer to floor  Best Practice / Goals of Care / Disposition.   DVT: none in setting of GIB SUP: None NUTRITION: CLD MOBILITY: Bedrest GOALS OF CARE: Full code FAMILY DISCUSSIONS: Daughter at bedside, discussed plan with patient and daughter  DISPOSITION ICU admission  LABS  Glucose Recent Labs  Lab 09/23/18 2147  GLUCAP 104*    BMET Recent Labs  Lab 09/24/18 1701 09/25/18 1123 09/26/18 0554  NA 139 141 141  K 4.4 3.4* 3.2*  CL 101 103 103  CO2 26 27 29   BUN 139* 114* 92*  CREATININE 4.00* 3.43* 3.18*  GLUCOSE 96 97 97    Liver Enzymes Recent Labs  Lab 09/23/18 1930  AST 28  ALT 22  ALKPHOS 36*  BILITOT 0.6  ALBUMIN 3.5    Electrolytes Recent Labs  Lab 09/24/18 1701 09/25/18 1123 09/26/18 0554  CALCIUM 8.9 8.7* 8.5*  MG  --   --  2.7*  PHOS  --   --  4.5    CBC Recent Labs  Lab 09/25/18 2149 09/26/18 0554 09/26/18 0933  WBC 6.2 5.0 5.5  HGB 7.6* 7.1* 7.1*  HCT 25.0* 23.3* 23.6*  PLT 128* 106* 128*    ABG No results for input(s): PHART, PCO2ART, PO2ART in the last 168 hours.  Coag's Recent Labs  Lab 09/24/18 1701  INR 1.22    Sepsis Markers No results for input(s): LATICACIDVEN, PROCALCITON, O2SATVEN in the last 168 hours.  Cardiac Enzymes Recent Labs  Lab 09/24/18 0316 09/24/18 1052 09/24/18 1701  TROPONINI 0.05* 0.06* 0.06*    Asencion Noble, M.D. PGY1 Pager [phone] 09/26/2018 12:08 PM

## 2018-09-26 NOTE — Op Note (Signed)
[facility] Patient Name: [name] [name] Procedure Date : 09/26/2018 MRN: [id number] Attending MD: Gerrit Heck , MD Date of Birth: [birth year] CSN: [id number] Age: 73 Admit Type: Inpatient Procedure:                Upper GI endoscopy Indications:              Iron deficiency anemia, Heme positive stool,                            Cirrhosis rule out esophageal varices                           73 yo female with a history of well compensated                            NASH cirrhosis with no previous EGD presents with                            AKI on CKD and hyperkalemia and noted to have                            significant IDA with Hemoglobin 5.4 on admission                            (from baseline 10-11) with iron 14, iron sat 3%,                            ferritin 58. FOBT+ stools but otherwise without                            overt GI blood loss. She takes PO iron supplement                            as an outpatient and ASA 325 mg. Providers:                Gerrit Heck, MD, Carlyn Reichert, RN, Raynelle Bring, RN, Janeece Agee, Technician, Elspeth Cho                            Tech., Technician, Carver Fila Referring MD:              Medicines:                Monitored Anesthesia Care Complications:            No immediate complications. Estimated Blood Loss:     Estimated blood loss was minimal. Procedure:                Pre-Anesthesia Assessment:                           - Prior to the procedure, a History and Physical  was performed, and patient medications and                            allergies were reviewed. The patient's tolerance of                            previous anesthesia was also reviewed. The risks                            and benefits of the procedure and the sedation                            options and risks were discussed with the patient.                            All  questions were answered, and informed consent                            was obtained. Prior Anticoagulants: The patient has                            taken aspirin, last dose was 3 days prior to                            procedure. ASA Grade Assessment: III - A patient                            with severe systemic disease. After reviewing the                            risks and benefits, the patient was deemed in                            satisfactory condition to undergo the procedure.                           After obtaining informed consent, the endoscope was                            passed under direct vision. Throughout the                            procedure, the patient's blood pressure, pulse, and                            oxygen saturations were monitored continuously. The                            GIF-H190 ([id number]) Olympus gastroscope was                            introduced through the mouth, and advanced to the  second part of duodenum. The upper GI endoscopy was                            accomplished without difficulty. The patient                            tolerated the procedure well. Scope In: Scope Out: Findings:      The examined esophagus was normal.      The Z-line was regular and was found 45 cm from the incisors.      Localized moderately erythematous mucosa with a few superficial,       non-bleeding erosions was found in the gastric antrum. No high grade       stigmata of bleeding noted. Biopsies were taken with a cold forceps for       Helicobacter pylori testing. Estimated blood loss was minimal.      The gastric fundus and gastric body were otherwise normal. Hill Grade II       valve noted on retroflexed views without hernia on anterograde or       retroflexed views.      The duodenal bulb, first portion of the duodenum and second portion of       the duodenum were normal. Biopsies for histology were taken with a cold        forceps for evaluation of celiac disease. Estimated blood loss was       minimal. Impression:               - Normal esophagus.                           - Z-line regular, 45 cm from the incisors.                           - Erythematous mucosa in the antrum. Biopsied.                           - Normal gastric fundus and gastric body.                           - Normal duodenal bulb, first portion of the                            duodenum and second portion of the duodenum.                            Biopsied.                           - While the noted antral erosive gastritis could                            certainly serve as the etiology for IDA without                            overt GI blood loss, will need to discuss the  risk/benefit profile of medical management of                            gastritis with follow-up Hemogram and iron panels                            vs further evaluation for additional sources of                            occult GI blood loss with Colonoscopy +/- Video                            Capsule Endoscopy. Given her hemodynamic stability                            and stable post transfusion hemograms, do not feel                            that this is something that necessarily needs to                            prolong her hospital course and can likely be done                            as an outpatient pending discussion with patient                            and her family. Recommendation:           - Return patient to hospital ward for ongoing care.                           - Clear liquid diet today.                           - Continue present medications.                           - Use Protonix (pantoprazole) 40 mg PO BID for 8                            weeks to promote mucosal healing. Recommend                            continued PPI long term for GI prophylaxis while                             continuing to take antiplatelet therapy.                           - Await pathology results.                           - As possible, please continue to hold ASA 325 mg  to allow continued mucosal healing. Otherwise, to                            avoid additional NSAIDs.                           - Will discuss possible colonoscopy +/- VCE, to                            include the optimal timing of these studies, with                            the patient and her family following recovery from                            sedation. Procedure Code(s):        --- Professional ---                           [phone], Esophagogastroduodenoscopy, flexible,                            transoral; with biopsy, single or multiple Diagnosis Code(s):        --- Professional ---                           K31.89, Other diseases of stomach and duodenum                           D50.9, Iron deficiency anemia, unspecified                           R19.5, Other fecal abnormalities                           K74.60, Unspecified cirrhosis of liver CPT copyright 2018 American Medical Association. All rights reserved. The codes documented in this report are preliminary and upon coder review may  be revised to meet current compliance requirements. Gerrit Heck, MD 09/26/2018 8:43:36 AM Number of Addenda: 0

## 2018-09-26 NOTE — Anesthesia Postprocedure Evaluation (Signed)
Anesthesia Post Note  Patient: [name] [name]  Procedure(s) Performed: ESOPHAGOGASTRODUODENOSCOPY (EGD) WITH PROPOFOL (N/A ) BIOPSY     Patient location during evaluation: PACU Anesthesia Type: MAC Level of consciousness: awake and alert Pain management: pain level controlled Vital Signs Assessment: post-procedure vital signs reviewed and stable Respiratory status: spontaneous breathing, nonlabored ventilation, respiratory function stable and patient connected to nasal cannula oxygen Cardiovascular status: blood pressure returned to baseline and stable Postop Assessment: no apparent nausea or vomiting Anesthetic complications: no    Last Vitals:  Vitals:   09/26/18 0700 09/26/18 0717  BP: (!) 132/53   Pulse:    Resp: (!) 28 (!) 21  Temp:  36.4 C  SpO2: 100% 100%    Last Pain:  Vitals:   09/26/18 0717  TempSrc: Oral  PainSc: 0-No pain                 Brennan Bailey

## 2018-09-26 NOTE — Transfer of Care (Signed)
Immediate Anesthesia Transfer of Care Note  Patient: [name] [name]  Procedure(s) Performed: ESOPHAGOGASTRODUODENOSCOPY (EGD) WITH PROPOFOL (N/A ) BIOPSY  Patient Location: Endoscopy Unit  Anesthesia Type:MAC  Level of Consciousness: awake, alert  and oriented  Airway & Oxygen Therapy: Patient Spontanous Breathing and Patient connected to nasal cannula oxygen  Post-op Assessment: Report given to RN and Post -op Vital signs reviewed and stable  Post vital signs: Reviewed and stable  Last Vitals:  Vitals Value Taken Time  BP    Temp    Pulse 103 09/26/2018  8:29 AM  Resp 20 09/26/2018  8:29 AM  SpO2 99 % 09/26/2018  8:29 AM  Vitals shown include unvalidated device data.  Last Pain:  Vitals:   09/26/18 0717  TempSrc: Oral  PainSc: 0-No pain         Complications: No apparent anesthesia complications

## 2018-09-26 NOTE — Progress Notes (Addendum)
[facility] Progress Note   Subjective:   Underwent EGD today - per report no active bleeding, a few biopsies taken.  Hb has been in the 7s.  Has actually developed hypokalemia.   Requesting diuretic be resumed as well as allopurinol.  Objective Vitals:   09/26/18 1126 09/26/18 1200 09/26/18 1300 09/26/18 1324  BP:  (!) 121/59  (!) 111/55  Pulse:      Resp:  (!) 27 (!) 30 (!) 30  Temp: 98.2 F (36.8 C)     TempSrc: Oral     SpO2:  100% 100% 100%  Weight:      Height:       Physical Exam General: chronically ill appearing but lying comfortably in bed. Heart: RRR, no rub, III/VI syst murmur through precordium Lungs: normal WOB, clear Abdomen: soft, nondistended Extremities: chronic venous stasis changes, no edema Neuro: nonfocal, conversant  Additional Objective Labs: Basic Metabolic Panel: Recent Labs  Lab 09/25/18 1123 09/26/18 0554 09/26/18 1149  NA 141 141 140  K 3.4* 3.2* 4.2  CL 103 103 104  CO2 27 29 27   GLUCOSE 97 97 186*  BUN 114* 92* 82*  CREATININE 3.43* 3.18* 3.06*  CALCIUM 8.7* 8.5* 8.5*  PHOS  --  4.5  --    Liver Function Tests: Recent Labs  Lab 09/23/18 1930  AST 28  ALT 22  ALKPHOS 36*  BILITOT 0.6  PROT 6.3*  ALBUMIN 3.5   No results for input(s): LIPASE, AMYLASE in the last 168 hours. CBC: Recent Labs  Lab 09/23/18 1930  09/25/18 1123 09/25/18 1624 09/25/18 2149 09/26/18 0554 09/26/18 0933  WBC 9.4   < > 5.3 6.0 6.2 5.0 5.5  NEUTROABS 7.1  --   --   --   --   --   --   HGB 5.4*   < > 7.0* 7.6* 7.6* 7.1* 7.1*  HCT 18.1*   < > 23.0* 24.6* 25.0* 23.3* 23.6*  MCV 104.0*   < > 97.5 98.0 97.3 99.6 97.9  PLT 175   < > 120* 142* 128* 106* 128*   < > = values in this interval not displayed.   Blood Culture    Component Value Date/Time   SDES BLOOD RIGHT WRIST 06/21/2018 0057   SPECREQUEST  06/21/2018 0057    BOTTLES DRAWN AEROBIC AND ANAEROBIC Blood Culture results may not be optimal due to an excessive volume of  blood received in culture bottles   CULT  06/21/2018 0057    NO GROWTH 5 DAYS Performed at [facility], [place] [address] [place], [place] [address]    REPTSTATUS 06/26/2018 FINAL 06/21/2018 0057    Cardiac Enzymes: Recent Labs  Lab 09/24/18 0316 09/24/18 1052 09/24/18 1701  TROPONINI 0.05* 0.06* 0.06*   CBG: Recent Labs  Lab 09/23/18 2147  GLUCAP 104*   Iron Studies:  Recent Labs    09/24/18 0316  IRON 14*  TIBC 406  FERRITIN 58   @[email or web address] @ Studies/Results: Dg Chest Port 1 View  Result Date: 09/24/2018 CLINICAL DATA:  Encounter for central line placement. EXAM: PORTABLE CHEST 1 VIEW COMPARISON:  09/23/2018. FINDINGS: Massive cardiac enlargement. Single lead pacer unchanged. BILATERAL airspace opacities have considerably worsened since yesterday's radiograph with large RIGHT pleural effusion consistent with pulmonary edema. Central venous line placement from RIGHT IJ approach lies with its tip in the mid to distal SVC. No pneumothorax. IMPRESSION: 1. Satisfactory RIGHT IJ central venous line placement. 2. Worsening pulmonary edema. 3. No pneumothorax.  Electronically Signed   By: Staci Righter M.D.   On: 09/24/2018 16:33   Medications: . levofloxacin (LEVAQUIN) IV Stopped (09/25/18 1329)   . sodium chloride   Intravenous Once  . sodium chloride   Intravenous Once  . Chlorhexidine Gluconate Cloth  6 each Topical Daily  . pantoprazole (PROTONIX) IV  40 mg Intravenous Q12H  . potassium chloride  40 mEq Oral BID  . sodium chloride flush  10-40 mL Intracatheter Q12H  . THROMBI-PAD  1 each Topical Once    Assessment/Plan:  **AKI on CKD: Baseline Cr ~2.5-3 (was 3.1 on 12/23 outpatient) and was 4.3 on admission in setting of hypovolemia/diuretic use.  Renal US was limited study due to bowel gas but no obvious obstruction.  Elevated BUN could also be contributed by GI bleed.  AKI is improved with volume resuscitation (blood, NS).  She is back close to her baseline  and from my perspective she can be discharged.  She is to have an appt with Dr. Jimmy Footman in about 4 weeks already.   **Anemia:  Likely multifactorial with large contribution of acute/subacute blood loss anemia.  She's rec'd 3u pRBC.  Scope per GI.  Has not previously required ESA.    **Hyperkalemia:  Resolved with K binders and IV fluids.  CTM daily K. On KCl supplement at home typically.  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 There is 3+ MR and 4+ TR.  EF 40-45%.   IVF per above,  Resume home torsemide to maintain euvolemia (80 BID).   Caution as she is so tenuous.   **HTN: anti HTN on hold currently. Normotensive currently. On imdur 15 alone outpt.  **OSA: bipap.    **Gout: resumed home allopurinol 300 at her request.   Jannifer Hick MD 09/26/2018, 3:39 PM  [facility] Pager: [phone]

## 2018-09-26 NOTE — Progress Notes (Signed)
CRITICAL VALUE ALERT  Critical Value:  K+ 3.2  Date & Time Notified:  Today, now  Provider Notified: Elink, RN  Orders Received/Actions taken: Due to pt's elevated BUN & Creatinine, Elink provider will not replete K+. Asked to pass on info to day shift attending.

## 2018-09-26 NOTE — Anesthesia Procedure Notes (Signed)
Procedure Name: MAC Date/Time: 09/26/2018 8:01 AM Performed by: Alain Marion, CRNA Pre-anesthesia Checklist: Patient identified, Emergency Drugs available, Suction available, Patient being monitored and Timeout performed Patient Re-evaluated:Patient Re-evaluated prior to induction Oxygen Delivery Method: Simple face mask Placement Confirmation: positive ETCO2

## 2018-09-26 NOTE — Interval H&P Note (Signed)
History and Physical Interval Note:  09/26/2018 7:55 AM  [name] [name]  has presented today for surgery, with the diagnosis of IDA  The various methods of treatment have been discussed with the patient and family. After consideration of risks, benefits and other options for treatment, the patient has consented to  Procedure(s): ESOPHAGOGASTRODUODENOSCOPY (EGD) WITH PROPOFOL (N/A) as a surgical intervention .  The patient's history has been reviewed, patient examined, no change in status, stable for surgery.  I have reviewed the patient's chart and labs.  Questions were answered to the patient's satisfaction.     Dominic Pea Aurore Redinger

## 2018-09-27 LAB — BASIC METABOLIC PANEL
Anion gap: 10 (ref 5–15)
Anion gap: 9 (ref 5–15)
BUN: 74 mg/dL — ABNORMAL HIGH (ref 8–23)
BUN: 70 mg/dL — ABNORMAL HIGH (ref 8–23)
CALCIUM: 8.4 mg/dL — AB (ref 8.9–10.3)
CO2: 29 mmol/L (ref 22–32)
CO2: 28 mmol/L (ref 22–32)
Calcium: 8.2 mg/dL — ABNORMAL LOW (ref 8.9–10.3)
Chloride: 104 mmol/L (ref 98–111)
Chloride: 104 mmol/L (ref 98–111)
Creatinine, Ser: 2.69 mg/dL — ABNORMAL HIGH (ref 0.44–1.00)
Creatinine, Ser: 2.75 mg/dL — ABNORMAL HIGH (ref 0.44–1.00)
GFR calc Af Amer: 20 mL/min — ABNORMAL LOW (ref >60)
GFR calc Af Amer: 19 mL/min — ABNORMAL LOW (ref >60)
GFR calc non Af Amer: 16 mL/min — ABNORMAL LOW (ref >60)
GFR, EST NON AFRICAN AMERICAN: 17 mL/min — AB (ref >60)
Glucose, Bld: 97 mg/dL (ref 70–99)
Glucose, Bld: 148 mg/dL — ABNORMAL HIGH (ref 70–99)
POTASSIUM: 3.4 mmol/L — AB (ref 3.5–5.1)
Potassium: 3.1 mmol/L — ABNORMAL LOW (ref 3.5–5.1)
Sodium: 143 mmol/L (ref 135–145)
Sodium: 141 mmol/L (ref 135–145)

## 2018-09-27 LAB — MAGNESIUM: Magnesium: 2.6 mg/dL — ABNORMAL HIGH (ref 1.7–2.4)

## 2018-09-27 LAB — CBC WITH DIFFERENTIAL/PLATELET
Abs Immature Granulocytes: 0.02 10*3/uL (ref 0.00–0.07)
Basophils Absolute: 0 10*3/uL (ref 0.0–0.1)
Basophils Relative: 0 %
Eosinophils Absolute: 0.3 10*3/uL (ref 0.0–0.5)
Eosinophils Relative: 5 %
HEMATOCRIT: 23 % — AB (ref 36.0–46.0)
Hemoglobin: 7.1 g/dL — ABNORMAL LOW (ref 12.0–15.0)
Immature Granulocytes: 0 %
LYMPHS PCT: 16 %
Lymphs Abs: 0.8 10*3/uL (ref 0.7–4.0)
MCH: 30.3 pg (ref 26.0–34.0)
MCHC: 30.9 g/dL (ref 30.0–36.0)
MCV: 98.3 fL (ref 80.0–100.0)
Monocytes Absolute: 0.5 10*3/uL (ref 0.1–1.0)
Monocytes Relative: 11 %
NEUTROS ABS: 3.2 10*3/uL (ref 1.7–7.7)
Neutrophils Relative %: 68 %
Platelets: 104 10*3/uL — ABNORMAL LOW (ref 150–400)
RBC: 2.34 MIL/uL — ABNORMAL LOW (ref 3.87–5.11)
RDW: 16.2 % — ABNORMAL HIGH (ref 11.5–15.5)
WBC: 4.8 10*3/uL (ref 4.0–10.5)
nRBC: 0 % (ref 0.0–0.2)

## 2018-09-27 LAB — PHOSPHORUS: Phosphorus: 3.9 mg/dL (ref 2.5–4.6)

## 2018-09-27 LAB — PREPARE RBC (CROSSMATCH)

## 2018-09-27 MED ORDER — DOCUSATE SODIUM 100 MG PO CAPS: 100.0000 mg | ORAL_CAPSULE | Freq: Every day | ORAL | Status: DC | PRN

## 2018-09-27 MED ORDER — SODIUM CHLORIDE 0.9% IV SOLUTION: Freq: Once | INTRAVENOUS | Status: AC

## 2018-09-27 MED ORDER — ISOSORBIDE MONONITRATE ER 30 MG PO TB24
15.0000 mg | ORAL_TABLET | Freq: Every day | ORAL | Status: DC
  Administered 2018-09-27 – 2018-09-28 (×2): 15 mg via ORAL
  Filled 2018-09-27 (×2): qty 1

## 2018-09-27 MED ORDER — POTASSIUM CHLORIDE CRYS ER 20 MEQ PO TBCR
40.0000 meq | EXTENDED_RELEASE_TABLET | Freq: Two times a day (BID) | ORAL | Status: DC
  Administered 2018-09-27: 40 meq via ORAL
  Filled 2018-09-27: qty 2

## 2018-09-27 MED ORDER — POLYETHYLENE GLYCOL 3350 17 G PO PACK
17.0000 g | PACK | Freq: Every day | ORAL | Status: DC
  Administered 2018-09-27 – 2018-09-28 (×2): 17 g via ORAL
  Filled 2018-09-27 (×2): qty 1

## 2018-09-27 MED ORDER — POTASSIUM CHLORIDE CRYS ER 20 MEQ PO TBCR
40.0000 meq | EXTENDED_RELEASE_TABLET | Freq: Three times a day (TID) | ORAL | Status: DC
  Administered 2018-09-27 – 2018-09-29 (×7): 40 meq via ORAL
  Filled 2018-09-27 (×7): qty 2

## 2018-09-27 MED ORDER — HYDROCODONE-ACETAMINOPHEN 10-325 MG PO TABS
1.0000 | ORAL_TABLET | Freq: Four times a day (QID) | ORAL | Status: DC | PRN
  Administered 2018-09-27 – 2018-09-28 (×2): 1 via ORAL
  Filled 2018-09-27 (×2): qty 1

## 2018-09-27 MED ORDER — PANTOPRAZOLE SODIUM 40 MG PO TBEC
40.0000 mg | DELAYED_RELEASE_TABLET | Freq: Two times a day (BID) | ORAL | Status: DC
  Administered 2018-09-27 – 2018-09-29 (×4): 40 mg via ORAL
  Filled 2018-09-27 (×4): qty 1

## 2018-09-27 MED ORDER — PANTOPRAZOLE SODIUM 40 MG PO TBEC: 40.0000 mg | DELAYED_RELEASE_TABLET | Freq: Two times a day (BID) | ORAL | Status: DC

## 2018-09-27 MED ORDER — SODIUM CHLORIDE 0.9% IV SOLUTION
Freq: Once | INTRAVENOUS | Status: AC
  Administered 2018-09-27: 15:00:00 via INTRAVENOUS

## 2018-09-27 NOTE — Progress Notes (Signed)
[name] [name] TEAM 1 - Stepdown/ICU TEAM  [name] [name]  WNI:[id number] DOB: Jul 14, 1946 DOA: 09/23/2018 PCP: [name] Amel, MD    Brief Narrative:  73 year old female with stage IV CKD, systolic heart failure with severe mitral regurgitation, pulmonary hypertension, and obstructive sleep apnea who presented with severe hyperkalemia and acute blood loss anemia.  She has been treated with Lokelma, Kayexalate and IV fluids.   Subjective: Sitting up in bedside chair. Denies cp, n/v, abdom pain. Has not yet been given a regular diet. C/o some intermittent low grade nose bleeding.   Assessment & Plan:   GIB - Erosive gastritis  EGD showed normal esophagus with erosive gastritis - plan is to cont protonix 40 mg BID for 8 weeks and hold ASA/avoid NSAIDS -advance diet today  Acute blood loss anemia - Chronic Iron Deficiency Anemia of CKD S/p 3U PRBC -as patient is hovering right at the transfusion threshold will transfuse 1 additional unit today  Hyperkalemia > Hypokalemia Initially required Lokelma and Kayexalate - now requiring K+ replacement - need to see K+ stabilize prior to d/c home -continue supplementation as ordered  Acute Kidney Injury on Stage IV CKD Baseline Cr 2.5-3 - Renal US with no evidence of hydronephrosis - Nephrology has seen in f/u   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 - Pulmonary HTN Followed in Heart Failure Clinic - EF 40-45% 06/20/18 -no gross volume overload on exam today  Afib s/p AV nodal ablation and PPM Chronic RV pacing in 90s - declined anticoagulation - continue to hold ASA  OSA Continue BiPAP nightly  DVT prophylaxis: SCDs Code Status: FULL CODE Family Communication: no family present at time of exam  Disposition Plan: Stable for transfer out of ICU -possible discharge home 24-48 hours  Consultants:  PCCM GI Nephrology   Antimicrobials:  none   Objective: Blood pressure (!) 126/58, pulse 88, temperature 97.9 F (36.6 C),  temperature source Oral, resp. rate (!) 23, height 5' 6"  (1.676 m), weight 109.7 kg, SpO2 96 %.  Intake/Output Summary (Last 24 hours) at 09/27/2018 1131 Last data filed at 09/27/2018 1100 Gross per 24 hour  Intake 350 ml  Output 2400 ml  Net -2050 ml   Filed Weights   09/26/18 0500 09/26/18 0717 09/27/18 0350  Weight: 109.1 kg 109.1 kg 109.7 kg    Examination: General: No acute respiratory distress Lungs: Clear to auscultation bilaterally without wheezes or crackles Cardiovascular: Regular rate and rhythm with 3/6 holosystolic murmur Abdomen: Nontender, overweight, soft, bowel sounds positive, no rebound Extremities: Trace bilateral lower extremity edema  CBC: Recent Labs  Lab 09/23/18 1930  09/26/18 1630 09/26/18 2025 09/27/18 0342  WBC 9.4   < > 5.4 5.4 4.8  NEUTROABS 7.1  --   --   --  3.2  HGB 5.4*   < > 7.0* 7.2* 7.1*  HCT 18.1*   < > 23.7* 24.0* 23.0*  MCV 104.0*   < > 98.8 99.2 98.3  PLT 175   < > 148* 134* 104*   < > = values in this interval not displayed.   Basic Metabolic Panel: Recent Labs  Lab 09/26/18 0554 09/26/18 1149 09/27/18 0342 09/27/18 0938  NA 141 140 143 141  K 3.2* 4.2 3.1* 3.4*  CL 103 104 104 104  CO2 29 27 29 28   GLUCOSE 97 186* 97 148*  BUN 92* 82* 74* 70*  CREATININE 3.18* 3.06* 2.69* 2.75*  CALCIUM 8.5* 8.5* 8.4* 8.2*  MG 2.7*  --  2.6*  --  PHOS 4.5  --  3.9  --    GFR: Estimated Creatinine Clearance: 22.9 mL/min (A) (by C-G formula based on SCr of 2.75 mg/dL (H)).  Liver Function Tests: Recent Labs  Lab 09/23/18 1930  AST 28  ALT 22  ALKPHOS 36*  BILITOT 0.6  PROT 6.3*  ALBUMIN 3.5    Coagulation Profile: Recent Labs  Lab 09/24/18 1701  INR 1.22    Cardiac Enzymes: Recent Labs  Lab 09/24/18 0316 09/24/18 1052 09/24/18 1701  TROPONINI 0.05* 0.06* 0.06*    HbA1C: Hgb A1c MFr Bld  Date/Time Value Ref Range Status  06/21/2018 02:54 AM 5.5 4.8 - 5.6 % Final    Comment:    (NOTE) Pre diabetes:           5.7%-6.4% Diabetes:              >6.4% Glycemic control for   <7.0% adults with diabetes   12/06/2010 03:55 AM (H) <5.7 % Final   6.2 (NOTE)                                                                       According to the ADA Clinical Practice Recommendations for 2011, when HbA1c is used as a screening test:   >=6.5%   Diagnostic of Diabetes Mellitus           (if abnormal result  is confirmed)  5.7-6.4%   Increased risk of developing Diabetes Mellitus  References:Diagnosis and Classification of Diabetes Mellitus,Diabetes DGUY,4034,74(QVZDG 1):S62-S69 and Standards of Medical Care in         Diabetes - 2011,Diabetes Care,2011,34  (Suppl 1):S11-S61.    CBG: Recent Labs  Lab 09/23/18 2147  GLUCAP 104*    Recent Results (from the past 240 hour(s))  MRSA PCR Screening     Status: None   Collection Time: 09/23/18 11:28 PM  Result Value Ref Range Status   MRSA by PCR NEGATIVE NEGATIVE Final    Comment:        The GeneXpert MRSA Assay (FDA approved for NASAL specimens only), is one component of a comprehensive MRSA colonization surveillance program. It is not intended to diagnose MRSA infection nor to guide or monitor treatment for MRSA infections. Performed at [facility], [place] [address], [place], [place] [address]      Scheduled Meds: . sodium chloride   Intravenous Once  . allopurinol  300 mg Oral Daily  . Chlorhexidine Gluconate Cloth  6 each Topical Daily  . pantoprazole (PROTONIX) IV  40 mg Intravenous Q12H  . potassium chloride  40 mEq Oral BID  . sodium chloride flush  10-40 mL Intracatheter Q12H  . THROMBI-PAD  1 each Topical Once  . torsemide  80 mg Oral BID     LOS: 4 days   [name] Altes, MD [facility] Office  [phone] Pager - Text Page per Amion  If 7PM-7AM, please contact night-coverage per Amion 09/27/2018, 11:31 AM

## 2018-09-27 NOTE — Progress Notes (Signed)
Report given to Hadar. Pt transferring from [place] to 6E02 in bed on room air, cardiac monitor in place & pt belongings in bed.

## 2018-09-27 NOTE — Progress Notes (Signed)
[facility] Progress Note   Subjective:   Underwent EGD yesterday - per report no active bleeding, a few biopsies taken.  Hb has been in the 7s.  Resumed home diuretics yest PM - I/Os 130 /1920. Cr improved this AM to 2.7.    Objective Vitals:   09/27/18 0600 09/27/18 0700 09/27/18 0755 09/27/18 0800  BP: (!) 103/48 (!) 93/44  (!) 142/66  Pulse:    88  Resp: (!) 25 (!) 27  (!) 22  Temp:   97.9 F (36.6 C)   TempSrc:   Oral   SpO2: 100% 99%  93%  Weight:      Height:       Physical Exam General: chronically ill appearing but lying comfortably in bed. Heart: RRR, no rub, III/VI syst murmur through precordium Lungs: normal WOB, clear Abdomen: soft, nondistended Extremities: chronic venous stasis changes, no edema Neuro: nonfocal, conversant  Additional Objective Labs: Basic Metabolic Panel: Recent Labs  Lab 09/26/18 0554 09/26/18 1149 09/27/18 0342  NA 141 140 143  K 3.2* 4.2 3.1*  CL 103 104 104  CO2 29 27 29   GLUCOSE 97 186* 97  BUN 92* 82* 74*  CREATININE 3.18* 3.06* 2.69*  CALCIUM 8.5* 8.5* 8.4*  PHOS 4.5  --  3.9   Liver Function Tests: Recent Labs  Lab 09/23/18 1930  AST 28  ALT 22  ALKPHOS 36*  BILITOT 0.6  PROT 6.3*  ALBUMIN 3.5   No results for input(s): LIPASE, AMYLASE in the last 168 hours. CBC: Recent Labs  Lab 09/23/18 1930  09/26/18 0554 09/26/18 0933 09/26/18 1630 09/26/18 2025 09/27/18 0342  WBC 9.4   < > 5.0 5.5 5.4 5.4 4.8  NEUTROABS 7.1  --   --   --   --   --  3.2  HGB 5.4*   < > 7.1* 7.1* 7.0* 7.2* 7.1*  HCT 18.1*   < > 23.3* 23.6* 23.7* 24.0* 23.0*  MCV 104.0*   < > 99.6 97.9 98.8 99.2 98.3  PLT 175   < > 106* 128* 148* 134* 104*   < > = values in this interval not displayed.   Blood Culture    Component Value Date/Time   SDES BLOOD RIGHT WRIST 06/21/2018 0057   SPECREQUEST  06/21/2018 0057    BOTTLES DRAWN AEROBIC AND ANAEROBIC Blood Culture results may not be optimal due to an excessive volume of blood  received in culture bottles   CULT  06/21/2018 0057    NO GROWTH 5 DAYS Performed at [facility], [place] [address], [place], [place] [address]    REPTSTATUS 06/26/2018 FINAL 06/21/2018 0057    Cardiac Enzymes: Recent Labs  Lab 09/24/18 0316 09/24/18 1052 09/24/18 1701  TROPONINI 0.05* 0.06* 0.06*   CBG: Recent Labs  Lab 09/23/18 2147  GLUCAP 104*   Iron Studies:  No results for input(s): IRON, TIBC, TRANSFERRIN, FERRITIN in the last 72 hours. @lablastinr3 @ Studies/Results: No results found. Medications: . levofloxacin (LEVAQUIN) IV Stopped (09/25/18 1329)   . sodium chloride   Intravenous Once  . allopurinol  300 mg Oral Daily  . Chlorhexidine Gluconate Cloth  6 each Topical Daily  . pantoprazole (PROTONIX) IV  40 mg Intravenous Q12H  . potassium chloride  40 mEq Oral BID  . sodium chloride flush  10-40 mL Intracatheter Q12H  . THROMBI-PAD  1 each Topical Once  . torsemide  80 mg Oral BID    Assessment/Plan:  **AKI on CKD: Baseline  Cr ~2.5-3 (was 3.1 on 12/23 outpatient) and was 4.3 on admission in setting of hypovolemia/diuretic use.  Renal US was limited study due to bowel gas but no obvious obstruction.  Elevated BUN could also be contributed by GI bleed.  AKI is improved with volume resuscitation (blood, NS).  She is back to her baseline and from my perspective she can be discharged.  She is to have an appt with Dr. Jimmy Footman in about 3-4 weeks already.   **Anemia:  Likely multifactorial with large contribution of acute/subacute blood loss anemia.  She's rec'd 3u pRBC.  Scope per GI.  Has not previously required ESA.    **Hypokalemia:  On KCl 20 BID at home.  Supplemented here with 40 BID today.   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 There is 3+ MR and 4+ TR.  EF 40-45%.   IVF per above,  Resumed home torsemide 1/3 to maintain euvolemia (80 BID).   Caution as she is so tenuous.   **HTN: anti HTN on  hold currently. Normotensive currently. On imdur 15 alone outpt.  **OSA: bipap.    **Gout: resumed home allopurinol 300 at her request.   Will sign off as it is anticipated that she will be discharged soon. Please call us back if any issues arise that we can help with.   Jannifer Hick MD 09/27/2018, 9:11 AM  [facility] Pager: [phone]

## 2018-09-27 NOTE — Consult Note (Addendum)
[facility] ROUNDING NOTE   Subjective: EGD completed yesterday and notable for a few superficial, nonbleeding erosions in the gastric antrum without high-grade stigmata requiring endoscopic intervention.  This was biopsied to evaluate for Helicobacter pylori.  Stomach, esophagus, and duodenum are otherwise normal-appearing.    No acute events overnight.  Tolerating p.o. and no further overt GI blood loss.  Hemoglobin essentially stable at 7.1.   Objective: Vital signs in last 24 hours: Temp:  [97.7 F (36.5 C)-98.7 F (37.1 C)] 97.9 F (36.6 C) (01/03 0755) Pulse Rate:  [88-100] 88 (01/03 0800) Resp:  [11-30] 23 (01/03 1200) BP: (93-146)/(43-122) 124/49 (01/03 1200) SpO2:  [93 %-100 %] 100 % (01/03 1200) Weight:  [109.7 kg] 109.7 kg (01/03 0350) Last BM Date: 09/25/18 General: NAD Abdomen: Soft, NT, ND    Intake/Output from previous day: 01/02 0701 - 01/03 0700 In: 130 [I.V.:130] Out: 1920 [Urine:1920] Intake/Output this shift: Total I/O In: 320 [P.O.:240; I.V.:80] Out: 600 [Urine:600]   Lab Results: Recent Labs    09/26/18 1630 09/26/18 2025 09/27/18 0342  WBC 5.4 5.4 4.8  HGB 7.0* 7.2* 7.1*  PLT 148* 134* 104*  MCV 98.8 99.2 98.3   BMET Recent Labs    09/26/18 1149 09/27/18 0342 09/27/18 0938  NA 140 143 141  K 4.2 3.1* 3.4*  CL 104 104 104  CO2 27 29 28   GLUCOSE 186* 97 148*  BUN 82* 74* 70*  CREATININE 3.06* 2.69* 2.75*  CALCIUM 8.5* 8.4* 8.2*   LFT No results for input(s): PROT, ALBUMIN, AST, ALT, ALKPHOS, BILITOT, BILIDIR, IBILI in the last 72 hours. PT/INR Recent Labs    09/24/18 1701  INR 1.22      Imaging/Other results: No results found.    Assessment   73 year old female with a history of well compensated NASH cirrhosis presenting with AKI on CKD, hyperkalemia with significant iron deficiency anemia with hemoglobin 5.4 on admission from baseline 10-11.  EGD demonstrating antral gastritis without active bleeding or  stigmata of bleeding.  Transfused 3 units on admission and hemoglobin has remained stable in the low 7's without evidence of overt GI blood loss.  - Resume high-dose PPI for 8 weeks, then will continue daily PPI for ongoing gastric prophylaxis - Will follow-up pathology results - As possible, please hold aspirin for 5 days total, then can restart - We had a long discussion regarding colonoscopy for ongoing evaluation of IDA yesterday and again today.  At this time, she declines colonoscopy either as inpatient or outpatient.  Explained that while the antral gastritis could serve as a cause for her IDA, in the absence of ulcers, particularly high-grade stigmata, cannot be certain.  Biopsies pending but have a low clinical suspicion for celiac/iron malabsorption, and therefore colonoscopy would be the next best step to evaluate for chronic, occult GI blood loss.  She has good insight and understands this well, but politely declines. - Repeat hemogram in 1 week post discharge - Repeat iron panel in 1 to 2 months -Agree with transfusing 1 additional unit today in anticipation of discharge in the next 24 hours or so -Okay to advance diet as tolerated from a GI standpoint - Given her location (lives in [place]) she would like to continue to follow with Trinda Pascal in [place] for cirrhosis.  She can always follow-up with me as needed or if procedures are needed as she prefers to have procedures done in [place].  Should she change her mind as an outpatient, always happy to discuss  ongoing evaluation of IDA with colonoscopy +/- VCE. - Would benefit from discharging on oral iron therapy -We will follow peripherally at this point.  However please do not hesitate to contact on-call GI with additional questions or concerns.   Addendum: Pathology results reviewed. Normal duodenum. Gastritis without H pylori.   Lavena Bullion, DO  09/27/2018, 12:37 PM [facility] Pager [phone]

## 2018-09-28 DIAGNOSIS — D509 Iron deficiency anemia, unspecified: Secondary | ICD-10-CM

## 2018-09-28 LAB — TYPE AND SCREEN
ABO/RH(D): O POS
Antibody Screen: NEGATIVE
Unit division: 0

## 2018-09-28 LAB — CBC
HCT: 25.4 % — ABNORMAL LOW (ref 36.0–46.0)
Hemoglobin: 7.7 g/dL — ABNORMAL LOW (ref 12.0–15.0)
MCH: 30.2 pg (ref 26.0–34.0)
MCHC: 30.3 g/dL (ref 30.0–36.0)
MCV: 99.6 fL (ref 80.0–100.0)
Platelets: 91 10*3/uL — ABNORMAL LOW (ref 150–400)
RBC: 2.55 MIL/uL — ABNORMAL LOW (ref 3.87–5.11)
RDW: 19.5 % — ABNORMAL HIGH (ref 11.5–15.5)
WBC: 4.2 10*3/uL (ref 4.0–10.5)
nRBC: 0 % (ref 0.0–0.2)

## 2018-09-28 LAB — RENAL FUNCTION PANEL
Albumin: 3.1 g/dL — ABNORMAL LOW (ref 3.5–5.0)
Anion gap: 7 (ref 5–15)
BUN: 60 mg/dL — ABNORMAL HIGH (ref 8–23)
CO2: 27 mmol/L (ref 22–32)
Calcium: 7.9 mg/dL — ABNORMAL LOW (ref 8.9–10.3)
Chloride: 101 mmol/L (ref 98–111)
Creatinine, Ser: 2.77 mg/dL — ABNORMAL HIGH (ref 0.44–1.00)
GFR calc Af Amer: 19 mL/min — ABNORMAL LOW (ref >60)
GFR calc non Af Amer: 16 mL/min — ABNORMAL LOW (ref >60)
Glucose, Bld: 163 mg/dL — ABNORMAL HIGH (ref 70–99)
Phosphorus: 2.9 mg/dL (ref 2.5–4.6)
Potassium: 4.3 mmol/L (ref 3.5–5.1)
Sodium: 135 mmol/L (ref 135–145)

## 2018-09-28 LAB — BPAM RBC
Blood Product Expiration Date: 202001292359
ISSUE DATE / TIME: 202001031441
Unit Type and Rh: 5100

## 2018-09-28 NOTE — Progress Notes (Signed)
Patient has home cpap. Plugged into red outlet and checked humidity. Patient will place on herself. RT will continue to monitor.

## 2018-09-28 NOTE — Progress Notes (Signed)
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  She has been treated with Lokelma, Kayexalate and IV fluids.   Subjective: The patient is sitting up on the side of her bed.  She reports good tolerance of her diet.  She denies chest pain shortness of breath fever chills nausea or vomiting.  She is motivated to be discharged home.  Assessment & Plan:   GIB - Erosive gastritis  EGD showed normal esophagus with erosive gastritis - plan is to cont protonix 40 mg BID for 8 weeks - hold ASA for 5 days - avoid NSAIDS - advance to regular diet today -if hemoglobin stable in a.m. will plan for discharge 1/5  Acute blood loss anemia - Chronic Iron Deficiency Anemia of CKD S/p 4U PRBC total - pt has refused colonscopy advised by GI -follow-up CBC in a.m.  Thrombocytopenia Likely related to consumption -follow-up in a.m.  Hyperkalemia > Hypokalemia Initially required Lokelma and Kayexalate - now requiring K+ replacement - need to see K+ stabilize prior to d/c home -continue supplementation as ordered -recheck potassium in a.m.  Acute Kidney Injury on Stage IV CKD Baseline Cr 2.5-3 - Renal US with no evidence of hydronephrosis - Nephrology has seen in f/u - crt stable within baseline range   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 - Pulmonary HTN Followed in [facility] Failure [facility] - EF 40-45% 06/20/18 -no gross volume overload on exam   Afib s/p AV nodal ablation and PPM Chronic RV pacing in 90s - declined anticoagulation - continue to hold ASA  OSA Continue BiPAP nightly  DVT prophylaxis: SCDs Code Status: FULL CODE Family Communication: Spoke with daughter, an Therapist, sports,  at bedside Disposition Plan: possible discharge home 24 hours  Consultants:  PCCM GI Nephrology   Antimicrobials:  none   Objective: Blood pressure 105/65, pulse 65, temperature 98.1 F (36.7 C), temperature source Oral, resp. rate (!) 22, height 5' 6"  (1.676 m), weight 112.9 kg, SpO2 99 %.  Intake/Output Summary (Last 24 hours) at 09/28/2018 1637 Last data filed at 09/28/2018 0904 Gross per 24 hour  Intake 965 ml  Output 900 ml  Net 65 ml   Filed Weights   09/26/18 0717 09/27/18 0350 09/28/18 0424  Weight: 109.1 kg 109.7 kg 112.9 kg    Examination: General: No acute respiratory distress Lungs: CTA bilaterally without wheezing Cardiovascular: RRR without murmur or rub Abdomen: Nontender, overweight, soft, bowel sounds positive, no rebound Extremities: Trace bilateral LE edema without change  CBC: Recent Labs  Lab 09/23/18 1930  09/26/18 2025 09/27/18 0342 09/28/18 0444  WBC 9.4   < > 5.4 4.8 4.2  NEUTROABS 7.1  --   --  3.2  --   HGB 5.4*   < > 7.2* 7.1* 7.7*  HCT 18.1*   < > 24.0* 23.0* 25.4*  MCV 104.0*   < > 99.2 98.3 99.6  PLT 175   < > 134* 104* 91*   < > = values in this interval not displayed.   Basic Metabolic Panel: Recent Labs  Lab 09/26/18 0554 09/26/18 1149 09/27/18 0342 09/27/18 0938  NA 141 140 143 141  K 3.2* 4.2 3.1* 3.4*  CL 103 104 104 104  CO2  29 27 29 28   GLUCOSE 97 186* 97 148*  BUN 92* 82* 74* 70*  CREATININE 3.18* 3.06* 2.69* 2.75*  CALCIUM 8.5* 8.5* 8.4* 8.2*  MG 2.7*  --  2.6*  --   PHOS 4.5  --  3.9  --    GFR: Estimated Creatinine Clearance: 23.2 mL/min (A) (by C-G formula based on SCr of 2.75 mg/dL (H)).  Liver Function Tests: Recent Labs  Lab 09/23/18 1930  AST 28  ALT 22  ALKPHOS 36*  BILITOT 0.6  PROT 6.3*  ALBUMIN 3.5    Coagulation Profile: Recent Labs  Lab 09/24/18 1701  INR 1.22    Cardiac Enzymes: Recent Labs  Lab 09/24/18 0316 09/24/18 1052 09/24/18 1701  TROPONINI 0.05* 0.06* 0.06*     HbA1C: Hgb A1c MFr Bld  Date/Time Value Ref Range Status  06/21/2018 02:54 AM 5.5 4.8 - 5.6 % Final    Comment:    (NOTE) Pre diabetes:          5.7%-6.4% Diabetes:              >6.4% Glycemic control for   <7.0% adults with diabetes   12/06/2010 03:55 AM (H) <5.7 % Final   6.2 (NOTE)                                                                       According to the ADA Clinical Practice Recommendations for 2011, when HbA1c is used as a screening test:   >=6.5%   Diagnostic of Diabetes Mellitus           (if abnormal result  is confirmed)  5.7-6.4%   Increased risk of developing Diabetes Mellitus  References:Diagnosis and Classification of Diabetes Mellitus,Diabetes AOZH,0865,78(IONGE 1):S62-S69 and Standards of Medical Care in         Diabetes - 2011,Diabetes Care,2011,34  (Suppl 1):S11-S61.    CBG: Recent Labs  Lab 09/23/18 2147  GLUCAP 104*    Recent Results (from the past 240 hour(s))  MRSA PCR Screening     Status: None   Collection Time: 09/23/18 11:28 PM  Result Value Ref Range Status   MRSA by PCR NEGATIVE NEGATIVE Final    Comment:        The GeneXpert MRSA Assay (FDA approved for NASAL specimens only), is one component of a comprehensive MRSA colonization surveillance program. It is not intended to diagnose MRSA infection nor to guide or monitor treatment for MRSA infections. Performed at [facility], [place] [address] [place], [place] [address]      Scheduled Meds: . allopurinol  300 mg Oral Daily  . Chlorhexidine Gluconate Cloth  6 each Topical Daily  . isosorbide mononitrate  15 mg Oral QHS  . pantoprazole  40 mg Oral BID  . polyethylene glycol  17 g Oral QHS  . potassium chloride  40 mEq Oral TID  . sodium chloride flush  10-40 mL Intracatheter Q12H  . THROMBI-PAD  1 each Topical Once  . torsemide  80 mg Oral BID     LOS: 5 days   Cherene Altes, MD [facility] Office  [phone] Pager - Text Page per Amion  If  7PM-7AM, please contact night-coverage per Amion 09/28/2018, 4:37 PM

## 2018-09-29 ENCOUNTER — Encounter (HOSPITAL_COMMUNITY): Payer: Self-pay | Admitting: Gastroenterology

## 2018-09-29 DIAGNOSIS — N19 Unspecified kidney failure: Secondary | ICD-10-CM

## 2018-09-29 LAB — BASIC METABOLIC PANEL
Anion gap: 8 (ref 5–15)
BUN: 56 mg/dL — ABNORMAL HIGH (ref 8–23)
CHLORIDE: 104 mmol/L (ref 98–111)
CO2: 26 mmol/L (ref 22–32)
Calcium: 8.2 mg/dL — ABNORMAL LOW (ref 8.9–10.3)
Creatinine, Ser: 2.65 mg/dL — ABNORMAL HIGH (ref 0.44–1.00)
GFR calc Af Amer: 20 mL/min — ABNORMAL LOW (ref >60)
GFR calc non Af Amer: 17 mL/min — ABNORMAL LOW (ref >60)
Glucose, Bld: 125 mg/dL — ABNORMAL HIGH (ref 70–99)
Potassium: 4.4 mmol/L (ref 3.5–5.1)
Sodium: 138 mmol/L (ref 135–145)

## 2018-09-29 LAB — CBC
HCT: 25.2 % — ABNORMAL LOW (ref 36.0–46.0)
HEMATOCRIT: 27.8 % — AB (ref 36.0–46.0)
Hemoglobin: 7.3 g/dL — ABNORMAL LOW (ref 12.0–15.0)
Hemoglobin: 8.2 g/dL — ABNORMAL LOW (ref 12.0–15.0)
MCH: 29.3 pg (ref 26.0–34.0)
MCH: 28.2 pg (ref 26.0–34.0)
MCHC: 29 g/dL — ABNORMAL LOW (ref 30.0–36.0)
MCHC: 29.5 g/dL — ABNORMAL LOW (ref 30.0–36.0)
MCV: 101.2 fL — ABNORMAL HIGH (ref 80.0–100.0)
MCV: 95.5 fL (ref 80.0–100.0)
Platelets: 95 10*3/uL — ABNORMAL LOW (ref 150–400)
Platelets: 106 10*3/uL — ABNORMAL LOW (ref 150–400)
RBC: 2.49 MIL/uL — ABNORMAL LOW (ref 3.87–5.11)
RBC: 2.91 MIL/uL — ABNORMAL LOW (ref 3.87–5.11)
RDW: 22.2 % — ABNORMAL HIGH (ref 11.5–15.5)
RDW: 15.8 % — ABNORMAL HIGH (ref 11.5–15.5)
WBC: 4 10*3/uL (ref 4.0–10.5)
WBC: 4.9 10*3/uL (ref 4.0–10.5)
nRBC: 0 % (ref 0.0–0.2)
nRBC: 0 % (ref 0.0–0.2)

## 2018-09-29 MED ORDER — PANTOPRAZOLE SODIUM 40 MG PO TBEC: 40.0000 mg | DELAYED_RELEASE_TABLET | Freq: Two times a day (BID) | ORAL | 0 refills | Status: DC

## 2018-09-29 MED ORDER — ASPIRIN 325 MG PO TABS: 325.0000 mg | ORAL_TABLET | Freq: Every day | ORAL | Status: DC

## 2018-09-29 NOTE — Progress Notes (Addendum)
Spoke with Otila Kluver RN, aware of d/c CVC order.

## 2018-09-29 NOTE — Progress Notes (Signed)
Pt stable. D/C home with daughter via wheelchair

## 2018-09-29 NOTE — Consult Note (Signed)
Family wants to keep central line for now, RN will put consult back in if needed

## 2018-09-29 NOTE — Plan of Care (Signed)
  Problem: Education: Goal: Knowledge of disease and its progression will improve Outcome: Progressing   Problem: Fluid Volume: Goal: Compliance with measures to maintain balanced fluid volume will improve Outcome: Progressing   Problem: Nutritional: Goal: Ability to make healthy dietary choices will improve Outcome: Progressing   Problem: Clinical Measurements: Goal: Complications related to the disease process, condition or treatment will be avoided or minimized Outcome: Progressing

## 2018-09-29 NOTE — Discharge Instructions (Signed)
Gastrointestinal Bleeding Gastrointestinal (GI) bleeding is bleeding somewhere along the digestive tract, between the mouth and anus. This can be caused by various problems. The severity of these problems can range from mild to serious or even life-threatening. If you have GI bleeding, you may find blood in your stools (feces), you may have black stools, or you may vomit blood. If there is a lot of bleeding, you may need to stay in the hospital. What are the causes? This condition may be caused by:  Esophagitis. This is inflammation, irritation, or swelling of the esophagus.  Hemorrhoids.These are swollen veins in the rectum.  Anal fissures.These are areas of painful tearing that are often caused by passing hard stool.  Diverticulosis.These are pouches that form on the colon over time, with age, and may bleed a lot.  Diverticulitis.This is inflammation in areas with diverticulosis. It can cause pain, fever, and bloody stools, although bleeding may be mild.  Polyps and cancer. Colon cancer often starts out as precancerous polyps.  Gastritis and ulcers. With these, bleeding may come from the upper GI tract, near the stomach. What are the signs or symptoms? Symptoms of this condition may include:  Bright red blood in your vomit, or vomit that looks like coffee grounds.  Bloody, black, or tarry stools. ? Bleeding from the lower GI tract will usually cause red or maroon blood in the stools. ? Bleeding from the upper GI tract may cause black, tarry, often bad-smelling stools. ? In certain cases, if the bleeding is fast enough, the stools may be red.  Pain or cramping in the abdomen. How is this diagnosed? This condition may be diagnosed based on:  Medical history and physical exam.  Various tests, such as: ? Blood tests. ? X-rays and other imaging tests. ? Esophagogastroduodenoscopy (EGD). In this test, a flexible, lighted tube is used to look at your esophagus, stomach, and small  intestine. ? Colonoscopy. In this test, a flexible, lighted tube is used to look at your colon. How is this treated? Treatment for this condition depends on the cause of the bleeding. For example:  For bleeding from the esophagus, stomach, small intestine, or colon, the health care provider doing your EGD or colonoscopy may be able to stop the bleeding as part of the procedure.  Inflammation or infection of the colon can be treated with medicines.  Certain rectal problems can be treated with creams, suppositories, or warm baths.  Surgery is sometimes needed.  Blood transfusions are sometimes needed if a lot of blood has been lost. If bleeding is slow, you may be allowed to go home. If there is a lot of bleeding, you will need to stay in the hospital for observation. Follow these instructions at home:   Take over-the-counter and prescription medicines only as told by your health care provider.  Eat foods that are high in fiber. This will help to keep your stools soft. These foods include whole grains, legumes, fruits, and vegetables. Eating 1-3 prunes each day works well for many people.  Drink enough fluid to keep your urine clear or pale yellow.  Keep all follow-up visits as told by your health care provider. This is important. Contact a health care provider if:  Your symptoms do not improve. Get help right away if:  Your bleeding increases.  You feel light-headed or you faint.  You feel weak.  You have severe cramps in your back or abdomen.  You pass large blood clots in your stool.  Your symptoms  are getting worse. This information is not intended to replace advice given to you by your health care provider. Make sure you discuss any questions you have with your health care provider. Document Released: 09/08/2000 Document Revised: 02/09/2016 Document Reviewed: 03/01/2015 Elsevier Interactive Patient Education  2019 [employer].

## 2018-09-29 NOTE — Discharge Summary (Signed)
DISCHARGE SUMMARY  [name] [name]  MR#: [id number]  DOB:16-Jun-1946  Date of Admission: 09/23/2018 Date of Discharge: 09/29/2018  Attending Physician:[name] Hennie Duos, MD  Patient's BEE:[name], [name], MD  Consults: PCCM GI Nephrology   Disposition: DC home  Follow-up Appts: Follow-up Information    [name] [name], [name], MD Follow up in 1 week(s).   Specialty:  Family Medicine Contact information: [place] [address] [phone]        [name] Records, MD .   Specialty:  Cardiology Contact information: [place] [address] [phone]        [name] Binder, MD Follow up in 2 week(s).   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 She has been treated with Lokelma, Kayexalate and IV fluids.   Hospital Course:  GIB - Erosive gastritis  EGD showed normal esophagus with erosive gastritis - plan is to cont protonix 40 mg BID for 8 weeks - hold ASA for 5 days - avoid NSAIDS - advanced to regular diet 24hrs prior to d/c and tolerated w/o trouble - Hgb assessed on morning and afternoon of d/c and  proven to be stable/improving   Acute blood loss anemia - Chronic Iron Deficiency Anemia of CKD S/p 4U PRBC total - pt has refused the colonscopy that was advised by GI -follow-up CBC confirmed Hgb stability prior to d/c home   Thrombocytopenia Likely related to consumption - PLT count improving at time of d/c   Hyperkalemia > Hypokalemia Initially required Lokelma and Kayexalate - then required K+ replacement - K+ has stabilized at time of d/c - resume usual home dose at d/c - will need outpt f/u   Acute Kidney Injury on Stage IV CKD Baseline Cr 2.5-3 - Renal US with no evidence of hydronephrosis - Nephrology saw the pt while hospitalized - crt stable within baseline range at time of d/c   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 - Pulmonary HTN Followed in [facility] - EF 40-45% 06/20/18 -no gross volume overload on exam - f/u in CHF Clinic as outpt   Afib s/p AV nodal ablation and PPM Chronic RV pacing in 90s - declined anticoagulation - to resume aspirin 09/30/18   OSA Continue BiPAP nightly  Allergies as of 09/29/2018      Reactions   Cephalexin Shortness Of Breath   Codeine Anaphylaxis   REACTION: throat swelling   Contrast Media [iodinated Diagnostic Agents] Other (See Comments)   Patient states "I have chronic kidney disease so the doctor said no dye in my veins."   Peppermint Flavor Shortness Of Breath   Prednisone Anaphylaxis, Shortness Of Breath, Swelling   REACTION: swelling, S.O.B.  Tape Other (See Comments)   States plastic tape blisters her skin   Ciprofloxacin Hives   Tolerating levofloxin    Latex Itching, Rash   Penicillins Hives   DID THE REACTION INVOLVE: Swelling of the face/tongue/throat, SOB, or low BP? No Sudden or severe rash/hives, skin peeling, or the inside of the mouth or nose? Yes Did it require medical treatment? Pt was in hospital at the time of reaction When did it last happen?age - mid 40's If all above answers  are "NO", may proceed with cephalosporin use.   Coreg [carvedilol] Other (See Comments)   Beta Blockers cause her organs to shut down per patient   Diamox [acetazolamide] Rash   Rash after 2 doses of diamox       Medication List    TAKE these medications   allopurinol 300 MG tablet Commonly known as:  ZYLOPRIM Take 300 mg by mouth every evening.   aspirin 325 MG tablet Take 1 tablet (325 mg total) by mouth at bedtime. Start taking on:  September 30, 2018   calcitRIOL 0.25 MCG capsule Commonly known as:  ROCALTROL Take 0.25 mcg by mouth every other day.   calcium carbonate 600 MG tablet Commonly known as:  OS-CAL Take 600 mg by mouth at bedtime.   COD LIVER OIL PO Take 1 capsule by mouth daily.   docusate sodium 100 MG capsule Commonly known as:  COLACE Take 100 mg by mouth daily as needed for mild constipation.   feeding supplement Liqd Take 1 Container by mouth 2 (two) times daily.   ferrous sulfate 325 (65 FE) MG tablet Take 325 mg by mouth at bedtime.   Glycerin (Adult) 2.1 g Supp Place 1 suppository rectally daily as needed for moderate constipation.   HYDROcodone-acetaminophen 10-325 MG tablet Commonly known as:  NORCO Take 1 tablet by mouth See admin instructions. Take 1 tablet by mouth twice daily, may also take 1/2 tablet an additional 2 times during the day as needed for pain   isosorbide mononitrate 30 MG 24 hr tablet Commonly known as:  IMDUR Take 0.5 tablets (15 mg total) by mouth daily. What changed:  when to take this   multivitamin with minerals Tabs tablet Take 1 tablet by mouth at bedtime.   nystatin powder Commonly known as:  MYCOSTATIN/NYSTOP Apply 1 application topically daily.   pantoprazole 40 MG tablet Commonly known as:  PROTONIX Take 1 tablet (40 mg total) by mouth 2 (two) times daily.   polyethylene glycol packet Commonly known as:  MIRALAX / GLYCOLAX Take 17 g by mouth at bedtime.   potassium chloride SA 20 MEQ tablet Commonly  known as:  K-DUR,KLOR-CON Take 1 tablet (20 mEq total) by mouth 2 (two) times daily. Will need an additional 19 MEQ today 09/17/18 What changed:    how much to take  when to take this  additional instructions   PRESCRIPTION MEDICATION Inhale into the lungs at bedtime. Bi-Pap   torsemide 20 MG tablet Commonly known as:  DEMADEX Take 4 tablets (80 mg total) by mouth 2 (two) times daily. May also take 1 tablet (20 mg total) as needed (for weight greater than 246).   vitamin B-12 500 MCG tablet Commonly known as:  CYANOCOBALAMIN Take 500 mcg by mouth daily.            Durable Medical Equipment  (From admission, onward)         Start     Ordered   09/29/18 1151  For home  use only DME Walker  Once    Comments:  Bariatric size required  Question:  Patient needs a walker to treat with the following condition  Answer:  Severe muscle deconditioning   09/29/18 1151          Day of Discharge BP 131/68 (BP Location: Right Wrist)   Pulse 94   Temp (!) 97.5 F (36.4 C) (Oral)   Resp (!) 25   Ht 5' 6"  (1.676 m)   Wt 114.1 kg   SpO2 98%   BMI 40.61 kg/m   Physical Exam: General: No acute respiratory distress Lungs: Clear to auscultation bilaterally without wheezes or crackles Cardiovascular: Regular rate and rhythm without murmur gallop or rub normal S1 and S2 Abdomen: Nontender, nondistended, soft, bowel sounds positive, no rebound, no ascites, no appreciable mass Extremities: stable trace B LE edema   Basic Metabolic Panel: Recent Labs  Lab 09/26/18 0554 09/26/18 1149 09/27/18 0342 09/27/18 0938 09/28/18 1924 09/29/18 1039  NA 141 140 143 141 135 138  K 3.2* 4.2 3.1* 3.4* 4.3 4.4  CL 103 104 104 104 101 104  CO2 29 27 29 28 27 26   GLUCOSE 97 186* 97 148* 163* 125*  BUN 92* 82* 74* 70* 60* 56*  CREATININE 3.18* 3.06* 2.69* 2.75* 2.77* 2.65*  CALCIUM 8.5* 8.5* 8.4* 8.2* 7.9* 8.2*  MG 2.7*  --  2.6*  --   --   --   PHOS 4.5  --  3.9  --  2.9  --      Liver Function Tests: Recent Labs  Lab 09/23/18 1930 09/28/18 1924  AST 28  --   ALT 22  --   ALKPHOS 36*  --   BILITOT 0.6  --   PROT 6.3*  --   ALBUMIN 3.5 3.1*    Coags: Recent Labs  Lab 09/24/18 1701  INR 1.22    CBC: Recent Labs  Lab 09/23/18 1930  09/26/18 2025 09/27/18 0342 09/28/18 0444 09/29/18 0625 09/29/18 1529  WBC 9.4   < > 5.4 4.8 4.2 4.0 4.9  NEUTROABS 7.1  --   --  3.2  --   --   --   HGB 5.4*   < > 7.2* 7.1* 7.7* 7.3* 8.2*  HCT 18.1*   < > 24.0* 23.0* 25.4* 25.2* 27.8*  MCV 104.0*   < > 99.2 98.3 99.6 101.2* 95.5  PLT 175   < > 134* 104* 91* 95* 106*   < > = values in this interval not displayed.    Cardiac Enzymes: Recent Labs  Lab 09/24/18 0316 09/24/18 1052 09/24/18 1701  TROPONINI 0.05* 0.06* 0.06*    CBG: Recent Labs  Lab 09/23/18 2147  GLUCAP 104*    Recent Results (from the past 240 hour(s))  MRSA PCR Screening     Status: None   Collection Time: 09/23/18 11:28 PM  Result Value Ref Range Status   MRSA by PCR NEGATIVE NEGATIVE Final    Comment:        The GeneXpert MRSA Assay (FDA approved for NASAL specimens only), is one component of a comprehensive MRSA colonization surveillance program. It is not intended to diagnose MRSA infection nor to guide or monitor treatment for MRSA infections. Performed at [facility], [place] [address] [place], [place] [address]      Time spent in discharge (includes decision making & examination of pt): 35 minutes  09/29/2018, 4:30 PM   Cherene Altes, MD [facility] Office  [phone] Pager [phone]  On-Call/Text Page:      [email or web address]      password [facility]

## 2018-09-29 NOTE — Evaluation (Signed)
Occupational Therapy Evaluation Patient Details Name: [name] [name] MRN: [id number] DOB: Jul 29, 1946 Today's Date: 09/29/2018    History of Present Illness 73 year old female with stage IV CKD, systolic heart failure with severe mitral regurgitation, pulmonary hypertension and obstructive sleep apnea who presents with severe hyperkalemia and acute blood loss anemia. Underwent EGD on 07/28/2019.   Clinical Impression   PTA, pt was living with her daughters and was independent. Currently, pt performing ADLs and functional transfers at supervision level for safety. Pt presenting near baseline function and reports "I am doing better now than I was before I get here." Pt highly motivated to return home. Pt describing BUE/BLE exercises she performs at home (taught to her by [facility] therapist). Pt reporting that her current RW is over 61 years old and will need a new bari RW. Answered all pt questions. Recommend dc home once medically stable per physician. All acute OT needs met and will sign off. Thank you.     Follow Up Recommendations  No OT follow up;Supervision/Assistance - 24 hour    Equipment Recommendations  Other (comment)(Bari RW)    Recommendations for Other Services       Precautions / Restrictions Precautions Precautions: Fall Restrictions Weight Bearing Restrictions: No      Mobility Bed Mobility Overal bed mobility: Modified Independent             General bed mobility comments: Increased time  Transfers Overall transfer level: Needs assistance Equipment used: None Transfers: Sit to/from Stand Sit to Stand: Supervision         General transfer comment: Supervision for safety.    Balance Overall balance assessment: Needs assistance Sitting-balance support: No upper extremity supported;Feet supported Sitting balance-Leahy Scale: Fair     Standing balance support: Single extremity supported;During functional activity Standing balance-Leahy Scale:  Poor Standing balance comment: Reliant on UE support                           ADL either performed or assessed with clinical judgement   ADL Overall ADL's : Needs assistance/impaired                                       General ADL Comments: Pt performing ADLs and functional transfers at Supervision level with increased time. Pt demonstrating near baseline function. Pt very motivated to be independent.      Vision         Perception     Praxis      Pertinent Vitals/Pain Pain Assessment: Faces Faces Pain Scale: Hurts little more Pain Location: BLEs Pain Descriptors / Indicators: Discomfort;Grimacing Pain Intervention(s): Monitored during session;Repositioned     Hand Dominance Right   Extremity/Trunk Assessment Upper Extremity Assessment Upper Extremity Assessment: Generalized weakness   Lower Extremity Assessment Lower Extremity Assessment: Generalized weakness   Cervical / Trunk Assessment Cervical / Trunk Assessment: Other exceptions Cervical / Trunk Exceptions: Increased body habitus   Communication Communication Communication: No difficulties   Cognition Arousal/Alertness: Awake/alert Behavior During Therapy: WFL for tasks assessed/performed Overall Cognitive Status: Within Functional Limits for tasks assessed                                     General Comments  VSS throughout. SpO2 90s on RA    Exercises  Shoulder Instructions      Home Living Family/patient expects to be discharged to:: Private residence Living Arrangements: Children(Daughters) Available Help at Discharge: Family;Available PRN/intermittently Type of Home: House Home Access: Level entry     Home Layout: Two level;Able to live on main level with bedroom/bathroom     Bathroom Shower/Tub: Tub/shower unit   Bathroom Toilet: Handicapped height     Home Equipment: Tub bench;Walker - 2 wheels;Bedside commode;Cane - single  point;Electric scooter;Hospital bed   Additional Comments: RW over 63 years old and needs a new one      Prior Functioning/Environment Level of Independence: Independent with assistive device(s)        Comments: Pt performing ADLs and simple IADLs without assistance. Pt transfers only at baseline, non-ambulatory and uses WC and electric scooter for mobility         OT Problem List: Decreased activity tolerance;Impaired balance (sitting and/or standing);Decreased knowledge of use of DME or AE;Decreased knowledge of precautions      OT Treatment/Interventions:      OT Goals(Current goals can be found in the care plan section) Acute Rehab OT Goals Patient Stated Goal: "Go home today" OT Goal Formulation: All assessment and education complete, DC therapy  OT Frequency:     Barriers to D/C:            Co-evaluation              AM-PAC OT "6 Clicks" Daily Activity     Outcome Measure Help from another person eating meals?: None Help from another person taking care of personal grooming?: None Help from another person toileting, which includes using toliet, bedpan, or urinal?: None Help from another person bathing (including washing, rinsing, drying)?: A Little Help from another person to put on and taking off regular upper body clothing?: None Help from another person to put on and taking off regular lower body clothing?: A Little 6 Click Score: 22   End of Session Nurse Communication: Mobility status  Activity Tolerance: Patient tolerated treatment well Patient left: in bed;with call bell/phone within reach(EOB)  OT Visit Diagnosis: Unsteadiness on feet (R26.81);Other abnormalities of gait and mobility (R26.89);Muscle weakness (generalized) (M62.81)                Time: 3200-3794 OT Time Calculation (min): 17 min Charges:  OT General Charges $OT Visit: 1 Visit OT Evaluation $OT Eval Moderate Complexity: [place], OTR/L Acute Rehab Pager:  [phone] Office: [place] 09/29/2018, 11:46 AM

## 2018-10-01 ENCOUNTER — Other Ambulatory Visit (HOSPITAL_COMMUNITY): Payer: Self-pay | Admitting: Internal Medicine

## 2018-10-02 ENCOUNTER — Other Ambulatory Visit: Payer: Self-pay

## 2018-10-02 ENCOUNTER — Encounter: Payer: Self-pay | Admitting: Gastroenterology

## 2018-10-02 ENCOUNTER — Ambulatory Visit (INDEPENDENT_AMBULATORY_CARE_PROVIDER_SITE_OTHER): Payer: Medicare Other | Admitting: Gastroenterology

## 2018-10-02 VITALS — BP 118/70 | HR 99 | Temp 97.1°F | Ht 66.0 in | Wt 255.0 lb

## 2018-10-02 DIAGNOSIS — D62 Acute posthemorrhagic anemia: Secondary | ICD-10-CM

## 2018-10-02 DIAGNOSIS — K7460 Unspecified cirrhosis of liver: Secondary | ICD-10-CM | POA: Diagnosis not present

## 2018-10-02 DIAGNOSIS — K922 Gastrointestinal hemorrhage, unspecified: Secondary | ICD-10-CM

## 2018-10-02 DIAGNOSIS — D649 Anemia, unspecified: Secondary | ICD-10-CM | POA: Diagnosis not present

## 2018-10-02 DIAGNOSIS — K7581 Nonalcoholic steatohepatitis (NASH): Secondary | ICD-10-CM

## 2018-10-02 DIAGNOSIS — K219 Gastro-esophageal reflux disease without esophagitis: Secondary | ICD-10-CM

## 2018-10-02 MED ORDER — NA SULFATE-K SULFATE-MG SULF 17.5-3.13-1.6 GM/177ML PO SOLN: 1.0000 | ORAL | 0 refills | Status: DC

## 2018-10-02 MED ORDER — PANTOPRAZOLE SODIUM 40 MG PO TBEC: 40.0000 mg | DELAYED_RELEASE_TABLET | Freq: Two times a day (BID) | ORAL | 5 refills | Status: AC

## 2018-10-02 NOTE — Patient Instructions (Addendum)
Colonoscopy as scheduled. See separate instructions.   Continue pantoprazole twice daily before a meal. RX sent to your pharmacy.   Labs as discussed. If you don't hear from me by Friday morning with results, please call [phone].   I will track down copy of your recent liver ultrasound for review.   Two Gram Sodium Diet 2000 mg  What is Sodium? Sodium is a mineral found naturally in many foods. The most significant source of sodium in the diet is table salt, which is about 40% sodium.  Processed, convenience, and preserved foods also contain a large amount of sodium.  The body needs only 500 mg of sodium daily to function,  A normal diet provides more than enough sodium even if you do not use salt.  Why Limit Sodium? A build up of sodium in the body can cause thirst, increased blood pressure, shortness of breath, and water retention.  Decreasing sodium in the diet can reduce edema and risk of heart attack or stroke associated with high blood pressure.  Keep in mind that there are many other factors involved in these health problems.  Heredity, obesity, lack of exercise, cigarette smoking, stress and what you eat all play a role.  General Guidelines:  Do not add salt at the table or in cooking.  One teaspoon of salt contains over 2 grams of sodium.  Read food labels  Avoid processed and convenience foods  Ask your dietitian before eating any foods not dicussed in the menu planning guidelines  Consult your physician if you wish to use a salt substitute or a sodium containing medication such as antacids.  Limit milk and milk products to 16 oz (2 cups) per day.  Shopping Hints:  READ LABELS!! "Dietetic" does not necessarily mean low sodium.  Salt and other sodium ingredients are often added to foods during processing.   Menu Planning Guidelines Food Group Choose More Often Avoid  Beverages (see also the milk group All fruit juices, low-sodium, salt-free vegetables juices,  low-sodium carbonated beverages Regular vegetable or tomato juices, commercially softened water used for drinking or cooking  Breads and Cereals Enriched white, wheat, rye and pumpernickel bread, hard rolls and dinner rolls; muffins, cornbread and waffles; most dry cereals, cooked cereal without added salt; unsalted crackers and breadsticks; low sodium or homemade bread crumbs Bread, rolls and crackers with salted tops; quick breads; instant hot cereals; pancakes; commercial bread stuffing; self-rising flower and biscuit mixes; regular bread crumbs or cracker crumbs  Desserts and Sweets Desserts and sweets mad with mild should be within allowance Instant pudding mixes and cake mixes  Fats Butter or margarine; vegetable oils; unsalted salad dressings, regular salad dressings limited to 1 Tbs; light, sour and heavy cream Regular salad dressings containing bacon fat, bacon bits, and salt pork; snack dips made with instant soup mixes or processed cheese; salted nuts  Fruits Most fresh, frozen and canned fruits Fruits processed with salt or sodium-containing ingredient (some dried fruits are processed with sodium sulfites        Vegetables Fresh, frozen vegetables and low- sodium canned vegetables Regular canned vegetables, sauerkraut, pickled vegetables, and others prepared in brine; frozen vegetables in sauces; vegetables seasoned with ham, bacon or salt pork  Condiments, Sauces, Miscellaneous  Salt substitute with physician's approval; pepper, herbs, spices; vinegar, lemon or lime juice; hot pepper sauce; garlic powder, onion powder, low sodium soy sauce (1 Tbs.); low sodium condiments (ketchup, chili sauce, mustard) in limited amounts (1 tsp.) fresh ground horseradish; unsalted tortilla  chips, pretzels, potato chips, popcorn, salsa (1/4 cup) Any seasoning made with salt including garlic salt, celery salt, onion salt, and seasoned salt; sea salt, rock salt, kosher salt; meat tenderizers; monosodium  glutamate; mustard, regular soy sauce, barbecue, sauce, chili sauce, teriyaki sauce, steak sauce, Worcestershire sauce, and most flavored vinegars; canned gravy and mixes; regular condiments; salted snack foods, olives, picles, relish, horseradish sauce, catsup   Food preparation: Try these seasonings Meats:    Pork Sage, onion Serve with applesauce  Chicken Poultry seasoning, thyme, parsley Serve with cranberry sauce  Lamb Curry powder, rosemary, garlic, thyme Serve with mint sauce or jelly  Veal Marjoram, basil Serve with current jelly, cranberry sauce  Beef Pepper, bay leaf Serve with dry mustard, unsalted chive butter  Fish [place] leaf, dill Serve with unsalted lemon butter, unsalted parsley butter  Vegetables:    Asparagus Lemon juice   Broccoli Lemon juice   Carrots Mustard dressing parsley, mint, nutmeg, glazed with unsalted butter and sugar   Green beans Marjoram, lemon juice, nutmeg,dill seed   Tomatoes Basil, marjoram, onion   Spice /blend for [employer]" 4 tsp ground thyme 1 tsp ground sage 3 tsp ground rosemary 4 tsp ground marjoram   Test your knowledge 1. A product that says "Salt Free" may still contain sodium. True or False 2. Garlic Powder and Hot Pepper Sauce an be used as alternative seasonings.True or False 3. Processed foods have more sodium than fresh foods.  True or False 4. Canned Vegetables have less sodium than froze True or False  WAYS TO DECREASE YOUR SODIUM INTAKE 1. Avoid the use of added salt in cooking and at the table.  Table salt (and other prepared seasonings which contain salt) is probably one of the greatest sources of sodium in the diet.  Unsalted foods can gain flavor from the sweet, sour, and butter taste sensations of herbs and spices.  Instead of using salt for seasoning, try the following seasonings with the foods listed.  Remember: how you use them to enhance natural food flavors is limited only by your creativity... Allspice-Meat, fish, eggs,  fruit, peas, red and yellow vegetables Almond Extract-Fruit baked goods Anise Seed-Sweet breads, fruit, carrots, beets, cottage cheese, cookies (tastes like licorice) Basil-Meat, fish, eggs, vegetables, rice, vegetables salads, soups, sauces Bay Leaf-Meat, fish, stews, poultry Burnet-Salad, vegetables (cucumber-like flavor) Caraway Seed-Bread, cookies, cottage cheese, meat, vegetables, cheese, rice Cardamon-Baked goods, fruit, soups Celery Powder or seed-Salads, salad dressings, sauces, meatloaf, soup, bread.Do not use  celery salt Chervil-Meats, salads, fish, eggs, vegetables, cottage cheese (parsley-like flavor) Chili Power-Meatloaf, chicken cheese, corn, eggplant, egg dishes Chives-Salads cottage cheese, egg dishes, soups, vegetables, sauces Cilantro-Salsa, casseroles Cinnamon-Baked goods, fruit, pork, lamb, chicken, carrots Cloves-Fruit, baked goods, fish, pot roast, green beans, beets, carrots Coriander-Pastry, cookies, meat, salads, cheese (lemon-orange flavor) Cumin-Meatloaf, fish,cheese, eggs, cabbage,fruit pie (caraway flavor) [employer], fruit, eggs, fish, poultry, cottage cheese, vegetables Dill Seed-Meat, cottage cheese, poultry, vegetables, fish, salads, bread Fennel Seed-Bread, cookies, apples, pork, eggs, fish, beets, cabbage, cheese, Licorice-like flavor Garlic-(buds or powder) Salads, meat, poultry, fish, bread, butter, vegetables, potatoes.Do not  use garlic salt Ginger-Fruit, vegetables, baked goods, meat, fish, poultry Horseradish Root-Meet, vegetables, butter Lemon Juice or Extract-Vegetables, fruit, tea, baked goods, fish salads Mace-Baked goods fruit, vegetables, fish, poultry (taste like nutmeg) Maple Extract-Syrups Marjoram-Meat, chicken, fish, vegetables, breads, green salads (taste like Sage) Mint-Tea, lamb, sherbet, vegetables, desserts, carrots, cabbage Mustard, Dry or Seed-Cheese, eggs, meats, vegetables, poultry Nutmeg-Baked goods, fruit, chicken,  eggs, vegetables, desserts Onion Powder-Meat,  fish, poultry, vegetables, cheese, eggs, bread, rice salads (Do not use   Onion salt) Orange Extract-Desserts, baked goods Oregano-Pasta, eggs, cheese, onions, pork, lamb, fish, chicken, vegetables, green salads Paprika-Meat, fish, poultry, eggs, cheese, vegetables Parsley Flakes-Butter, vegetables, meat fish, poultry, eggs, bread, salads (certain forms may   Contain sodium Pepper-Meat fish, poultry, vegetables, eggs Peppermint Extract-Desserts, baked goods Poppy Seed-Eggs, bread, cheese, fruit dressings, baked goods, noodles, vegetables, cottage  [employer], poultry, meat, fish, cauliflower, turnips,eggs bread Saffron-Rice, bread, veal, chicken, fish, eggs Sage-Meat, fish, poultry, onions, eggplant, tomateos, pork, stews Savory-Eggs, salads, poultry, meat, rice, vegetables, soups, pork Tarragon-Meat, poultry, fish, eggs, butter, vegetables (licorice-like flavor)  Thyme-Meat, poultry, fish, eggs, vegetables, (clover-like flavor), sauces, soups Tumeric-Salads, butter, eggs, fish, rice, vegetables (saffron-like flavor) Vanilla Extract-Baked goods, candy Vinegar-Salads, vegetables, meat marinades Walnut Extract-baked goods, candy  2. Choose your Foods Wisely   The following is a list of foods to avoid which are high in sodium:  Meats-Avoid all smoked, canned, salt cured, dried and kosher meat and fish as well as Anchovies   [employer] meats:Bologna, Liverwurst, Pastrami Canned meat or fish  Marinated herring Caviar    Pepperoni Corned Beef   Pizza Dried chipped beef  Salami Frozen breaded fish or meat Salt pork Frankfurters or hot dogs  Sardines Gefilte fish   Sausage Ham (boiled ham, Proscuitto Smoked butt    spiced ham)   Spam      TV Dinners Vegetables Canned vegetables (Regular) Relish Canned mushrooms  Sauerkraut Olives    Tomato juice Pickles  Bakery and Dessert  Products Canned puddings  Cream pies Cheesecake   Decorated cakes Cookies  Beverages/Juices Tomato juice, regular  Gatorade   V-8 vegetable juice, regular  Breads and Cereals Biscuit mixes   Salted potato chips, corn chips, pretzels Bread stuffing mixes  Salted crackers and rolls Pancake and waffle mixes Self-rising flour  Seasonings Accent    Meat sauces Barbecue sauce  Meat tenderizer Catsup    Monosodium glutamate (MSG) Celery salt   Onion salt Chili sauce   Prepared mustard Garlic salt   Salt, seasoned salt, sea salt Gravy mixes   Soy sauce Horseradish   Steak sauce Ketchup   Tartar sauce Lite salt    Teriyaki sauce Marinade mixes   Worcestershire sauce  Others Baking powder   Cocoa and cocoa mixes Baking soda   Commercial casserole mixes Candy-caramels, chocolate  Dehydrated soups    Bars, fudge,nougats  Instant rice and pasta mixes Canned broth or soup  Maraschino cherries Cheese, aged and processed cheese and cheese spreads  Learning Assessment Quiz  Indicated T (for True) or F (for False) for each of the following statements:  1. _____ Fresh fruits and vegetables and unprocessed grains are generally low in sodium 2. _____ Water may contain a considerable amount of sodium, depending on the source 3. _____ You can always tell if a food is high in sodium by tasting it 4. _____ Certain laxatives my be high in sodium and should be avoided unless prescribed   by a physician or pharmacist 5. _____ Salt substitutes may be used freely by anyone on a sodium restricted diet 6. _____ Sodium is present in table salt, food additives and as a natural component of   most foods 7. _____ Table salt is approximately 90% sodium 8. _____ Limiting sodium intake may help prevent excess fluid accumulation in the body 9. _____ On a sodium-restricted diet, seasonings such as bouillon soy  sauce, and    cooking wine should be used in place of table salt 10. _____ On an ingredient list, a  product which lists monosodium glutamate as the first   ingredient is an appropriate food to include on a low sodium diet  Circle the best answer(s) to the following statements (Hint: there may be more than one correct answer)  11. On a low-sodium diet, some acceptable snack items are:    A. Olives  F. Bean dip   K. Grapefruit juice    B. Salted Pretzels G. Commercial Popcorn   L. Canned peaches    C. Carrot Sticks  H. Bouillon   M. Unsalted nuts   D. [place] fries  I. Peanut butter crackers N. Salami   E. Sweet pickles J. Tomato Juice   O. Pizza  12.  Seasonings that may be used freely on a reduced - sodium diet include   A. Lemon wedges F.Monosodium glutamate K. Celery seed    B.Soysauce   G. Pepper   L. Mustard powder   C. Sea salt  H. Cooking wine  M. Onion flakes   D. Vinegar  E. Prepared horseradish N. Salsa   E. Sage   J. Worcestershire sauce  O. Chutney

## 2018-10-02 NOTE — Progress Notes (Addendum)
REVIEWED. Consider benefits v. Risk ratio for colonoscopy. [name] AS AN INPT.  Primary Care Physician: [name], MD  Primary Gastroenterologist:    Chief Complaint  Patient presents with  . Cirrhosis  . hfu    GIB - Erosive gastritis- egd done 09/26/2018\  . Anemia    HPI: [name] [name] is a 73 y.o. female here for hospital follow-up.  She was recently hospitalized at [facility] after PCP sent her to ED for potassium over 7. She had had recent abdominal pain associated with N/V/D. She was found to have hyperkalemia, acute on chronic kidney failure, profound iron deficiency anemia, dark heme + stools.  She is on ASA daily. She has also had epistaxis for 3-4 months.    She has a history of [name] [name] cirrhosis (incidentally found at time of noncontrast CT in July 2017 which has been fairly well compensated without any history of GI bleeding.  Last seen by our practice in July 2019.  Advised to have upper endoscopy (to screen for esophageal varices) numerous times but she delayed scheduling for fear of sedation. Noted that nephrologist had stopped her omeprazole back in 12/2017, was still taking about 3 times per week for symptom control and along with her ASA.  While inpatient, she had EGD showing few superficial, nonbleeding erosions in the gastric antrum. bx negative for H.pylori. No varices. She received 3 or 4 units of prbcs. She declined inpatient or outpatient colonoscopy.   Hemoglobin on presentation was 5.4, MCV 104, platelets 175,000, iron 14, iron saturation is 3%, TIBC 406, ferritin 58.  At discharge her hemoglobin was 8.2.  Platelets 106,000.  Creatinine 2.65 (down from 4.32 on admission).  Potassium 4.4.  Patient patient reports abdominal ultrasound done at an outside facility last month.  We will request records.    Patient states she has chronic intermittent left upper quadrant pain, sometimes worse than others.  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  She  continues to take iron.  She states her stools are yellow in color.  She has had issues with lower extremity edema.  Reports her weight is up about 10 pounds.Switched from omeprazole to pantoprazole bid.  She denies melena or bright red blood per rectum.  Heartburn controlled.  She admits to not eating very much.  Today she is only had a boost.  Encouraged increased oral intake.  Last colonoscopy 1998.  Current Outpatient Medications  Medication Sig Dispense Refill  . allopurinol (ZYLOPRIM) 300 MG tablet Take 300 mg by mouth every evening.     Marland Kitchen aspirin 325 MG tablet Take 1 tablet (325 mg total) by mouth at bedtime.    . calcitRIOL (ROCALTROL) 0.25 MCG capsule Take 0.25 mcg by mouth every other day.     . COD LIVER OIL PO Take 1 capsule by mouth daily.     Marland Kitchen docusate sodium (COLACE) 100 MG capsule Take 100 mg by mouth daily as needed for mild constipation.     . feeding supplement (BOOST HIGH PROTEIN) LIQD Take 1 Container by mouth 2 (two) times daily.     . ferrous sulfate 325 (65 FE) MG tablet Take 325 mg by mouth at bedtime.     Marland Kitchen HYDROcodone-acetaminophen (NORCO) 10-325 MG per tablet Take 1 tablet by mouth See admin instructions. Take 1 tablet by mouth twice daily, may also take 1/2 tablet an additional 2 times during the day as needed for pain    . isosorbide mononitrate (IMDUR)  30 MG 24 hr tablet Take 0.5 tablets (15 mg total) by mouth daily. (Patient taking differently: Take 15 mg by mouth at bedtime. ) 90 tablet 2  . metolazone (ZAROXOLYN) 2.5 MG tablet Take 2.5 mg by mouth daily.    . Multiple Vitamin (MULTIVITAMIN WITH MINERALS) TABS tablet Take 1 tablet by mouth at bedtime.    . pantoprazole (PROTONIX) 40 MG tablet Take 1 tablet (40 mg total) by mouth 2 (two) times daily. 60 tablet 0  . polyethylene glycol (MIRALAX / GLYCOLAX) packet Take 17 g by mouth at bedtime.    . potassium chloride SA (K-DUR,KLOR-CON) 20 MEQ tablet Take 1 tablet (20 mEq total) by mouth 2 (two) times daily. Will  need an additional 80 MEQ today 09/17/18 (Patient taking differently: Take 20 mEq by mouth 2 (two) times daily. ) 75 tablet 3  . PRESCRIPTION MEDICATION Inhale into the lungs at bedtime. Bi-Pap    . torsemide (DEMADEX) 20 MG tablet Take 4 tablets (80 mg total) by mouth 2 (two) times daily. May also take 1 tablet (20 mg total) as needed (for weight greater than 246). 270 tablet 7  . vitamin B-12 (CYANOCOBALAMIN) 500 MCG tablet Take 500 mcg by mouth daily.     No current facility-administered medications for this visit.     Allergies as of 10/02/2018 - Review Complete 10/02/2018  Allergen Reaction Noted  . Cephalexin Shortness Of Breath 06/13/2016  . Codeine Anaphylaxis   . Contrast media [iodinated diagnostic agents] Other (See Comments) 10/30/2014  . Peppermint flavor Shortness Of Breath 11/21/2016  . Prednisone Anaphylaxis, Shortness Of Breath, and Swelling   . Tape Other (See Comments) 03/06/2013  . Ciprofloxacin Hives   . Latex Itching and Rash   . Penicillins Hives   . Coreg [carvedilol] Other (See Comments) 11/12/2014  . Diamox [acetazolamide] Rash 07/15/2018   Past Medical History:  Diagnosis Date  . Anemia   . Atrial fibrillation ([place])   . Cataract   . Cervical cancer ([place]) 1991   s/p hysterectomy  . Chronic cellulitis   . Chronic combined systolic and diastolic congestive heart failure, NYHA class 2 (HCC)    LVEF 25-30% with restrictive diastolic filling  . Degenerative joint disease   . Diverticulitis   . Essential hypertension   . GERD (gastroesophageal reflux disease)   . Gout   . Kidney stones   . Morbid obesity ([place])   . NICM (nonischemic cardiomyopathy) ([place]) 02/22/2017  . Osteoarthritis   . Permanent atrial fibrillation 05/04/2009   Qualifier: History of  By: Quentin Cornwall CMA, Janett Billow    . 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  Lovena Le  . Sleep apnea    CPAP   Past Surgical History:  Procedure Laterality Date  . ABDOMINAL HYSTERECTOMY  1991  . BIOPSY  09/26/2018   Procedure: BIOPSY;  Surgeon: Lavena Bullion, DO;  Location: [place] [facility];  Service: Gastroenterology;;  . COLONOSCOPY  1998   one polyp per patient  . ESOPHAGOGASTRODUODENOSCOPY (EGD) WITH PROPOFOL N/A 09/26/2018   Procedure: ESOPHAGOGASTRODUODENOSCOPY (EGD) WITH PROPOFOL;  Surgeon: Lavena Bullion, DO;  Location: [place];  Service: Gastroenterology;  Laterality: N/A;  . EYE SURGERY    . PACEMAKER INSERTION  Nov 2000   St Jude with revision in 2011  . PERCUTANEOUS NEPHROLITHOTOMY  April 2012  . RIGHT HEART CATH N/A  06/28/2018   Procedure: RIGHT HEART CATH;  Surgeon: Jolaine Artist, MD;  Location: [place] [facility];  Service: Cardiovascular;  Laterality: N/A;  . TEE WITHOUT CARDIOVERSION N/A 06/28/2018   Procedure: TRANSESOPHAGEAL ECHOCARDIOGRAM (TEE);  Surgeon: Jolaine Artist, MD;  Location: [facility] END[facility];  Service: Cardiovascular;  Laterality: N/A;   Family History  Problem Relation Age of Onset  . Heart attack Father   . Stroke Mother   . Diabetes Sister   . Colon cancer Other        seven family members  . Liver disease Neg Hx   . Other Neg Hx    Social History   Tobacco Use  . Smoking status: Former Smoker    Packs/day: 1.00    Types: Cigarettes    Last attempt to quit: 09/26/1979    Years since quitting: 39.0  . Smokeless tobacco: Never Used  Substance Use Topics  . Alcohol use: No  . Drug use: No    ROS:  General: Negative for anorexia, weight loss, fever, chills,+ fatigue, +weakness. ENT: Negative for hoarseness, difficulty swallowing , nasal congestion. CV: Negative for chest pain, angina, palpitations, dyspnea on exertion,+ peripheral edema.  Respiratory: Negative for dyspnea at rest, dyspnea on exertion, cough, sputum, wheezing.  GI: See history of present illness. GU:  Negative for dysuria, hematuria,  urinary incontinence, urinary frequency, nocturnal urination.  Endo: Negative for unusual weight change.    Physical Examination:   BP 118/70   Pulse 99   Temp (!) 97.1 F (36.2 C) (Oral)   Ht 5' 6"  (1.676 m)   Wt 255 lb (115.7 kg)   BMI 41.16 kg/m   General: morbidly obese, chronically ill appearing, pleasant WF in no acute distress.  Eyes: No icterus. Mouth: Oropharyngeal mucosa moist and pink , no lesions erythema or exudate. Lungs: Clear to auscultation bilaterally.  Heart: Regular rate and rhythm, no murmurs rubs or gallops.  Abdomen: Bowel sounds are normal, nontender, obese. Examined in wheelchair due to inability to get on exam table. Limited exam.  no abdominal bruits or hernia , no rebound or guarding.   Extremities: 2+ pitting edema bilaterally with chronic venous stasis changes. No clubbing or deformities. Neuro: Alert and oriented x 4   Skin: Warm and dry, no jaundice.   Psych: Alert and cooperative, normal mood and affect.  Labs:  Lab Results  Component Value Date   CREATININE 2.65 (H) 09/29/2018   BUN 56 (H) 09/29/2018   NA 138 09/29/2018   K 4.4 09/29/2018   CL 104 09/29/2018   CO2 26 09/29/2018   Lab Results  Component Value Date   ALT 22 09/23/2018   AST 28 09/23/2018   ALKPHOS 36 (L) 09/23/2018   BILITOT 0.6 09/23/2018   Lab Results  Component Value Date   INR 1.22 09/24/2018   INR 1.22 06/23/2018   INR 1.0 06/13/2016   Lab Results  Component Value Date   WBC 4.9 09/29/2018   HGB 8.2 (L) 09/29/2018   HCT 27.8 (L) 09/29/2018   MCV 95.5 09/29/2018   PLT 106 (L) 09/29/2018    Imaging Studies: Dg Chest 1 View  Result Date: 09/23/2018 CLINICAL DATA:  Tachycardia EXAM: CHEST  1 VIEW COMPARISON:  06/25/2018 FINDINGS: Left pacer remains in place, unchanged. Cardiomegaly with vascular congestion. Perihilar and lower lobe opacities likely reflect edema, improved since prior study. Improving right effusion with small residual right effusion.  IMPRESSION: Cardiomegaly with vascular congestion and mild pulmonary edema, improving since prior  study. Small right effusion, and also improving since prior study. Electronically Signed   By: Rolm Baptise M.D.   On: 09/23/2018 20:00   US Renal  Result Date: 09/24/2018 CLINICAL DATA:  Acute onset of renal insufficiency. EXAM: RENAL / URINARY TRACT ULTRASOUND COMPLETE COMPARISON:  CT of the abdomen and pelvis from 04/05/2016 FINDINGS: Right Kidney: Renal measurements: 8.5 x 4.4 x 4.9 cm = volume: 93.1 mL. Increased renal parenchymal echogenicity is noted. No mass or hydronephrosis visualized. Left Kidney: Renal measurements: 7.7 x 4.5 x 6.3 cm = volume: 112.6 mL. Not well characterized due to overlying bowel gas and limitations in positioning. Increased renal parenchymal echogenicity is noted. No mass or hydronephrosis visualized. Bladder: Not visualized. IMPRESSION: Significantly limited study due to limitations in positioning and overlying bowel gas. No evidence of hydronephrosis. Increased renal parenchymal echogenicity may reflect medical renal disease. Electronically Signed   By: Garald Balding M.D.   On: 09/24/2018 01:53   Dg Chest Port 1 View  Result Date: 09/24/2018 CLINICAL DATA:  Encounter for central line placement. EXAM: PORTABLE CHEST 1 VIEW COMPARISON:  09/23/2018. FINDINGS: Massive cardiac enlargement. Single lead pacer unchanged. BILATERAL airspace opacities have considerably worsened since yesterday's radiograph with large RIGHT pleural effusion consistent with pulmonary edema. Central venous line placement from RIGHT IJ approach lies with its tip in the mid to distal SVC. No pneumothorax. IMPRESSION: 1. Satisfactory RIGHT IJ central venous line placement. 2. Worsening pulmonary edema. 3. No pneumothorax. Electronically Signed   By: Staci Righter M.D.   On: 09/24/2018 16:33

## 2018-10-03 ENCOUNTER — Telehealth: Payer: Self-pay

## 2018-10-03 DIAGNOSIS — K7581 Nonalcoholic steatohepatitis (NASH): Secondary | ICD-10-CM | POA: Diagnosis not present

## 2018-10-03 DIAGNOSIS — K922 Gastrointestinal hemorrhage, unspecified: Secondary | ICD-10-CM | POA: Insufficient documentation

## 2018-10-03 DIAGNOSIS — K219 Gastro-esophageal reflux disease without esophagitis: Secondary | ICD-10-CM | POA: Diagnosis not present

## 2018-10-03 DIAGNOSIS — K7460 Unspecified cirrhosis of liver: Secondary | ICD-10-CM | POA: Diagnosis not present

## 2018-10-03 DIAGNOSIS — D649 Anemia, unspecified: Secondary | ICD-10-CM | POA: Diagnosis not present

## 2018-10-03 NOTE — Telephone Encounter (Signed)
T/C from [place] at [facility] in [place] with question about labs. She said labs were ordered STAT and they do not do STAT labs there. She told the pt she could go to the hospital to do them STAT and pt is refusing. Elmo Putt called Neil Crouch, PA and she said OK to do them regularly. Farris Has was informed.

## 2018-10-03 NOTE — Telephone Encounter (Signed)
Agree 

## 2018-10-03 NOTE — Assessment & Plan Note (Addendum)
Presented with macrocytic anemia, dark heme + stools. Hgb in 5 range. Received 3 or 4 units of prbcs. Hgb in 8 range at discharge. Mild gastric erosions on EGD. Last TCS 1998. Recommend colonoscopy for further evaluation. If unremarkable will need capsule endoscopy to complete GI work up .  I have discussed the risks, alternatives, benefits with regards to but not limited to the risk of reaction to medication, bleeding, infection, perforation and the patient is agreeable to proceed. Written consent to be obtained.  Update labs today.

## 2018-10-03 NOTE — Assessment & Plan Note (Signed)
Currently on pantoprazole 76m bid with recent dark heme + stool in setting of ASA and cirrhosis. gerd well controlled. Recent EGD while inpatient with few superficial erosions in the stomach. No esophageal varices. No H.pylori. continue PPI BID for now with plans to go to once daily in 4-8 weeks.

## 2018-10-03 NOTE — Assessment & Plan Note (Addendum)
Obtain copy of recent ruq u/s. Labs are up to date. She has some lower extremity edema on exam in the setting of CKD, CHF, cirrhosis. Recent EGD without evidence of esophageal varices.

## 2018-10-04 ENCOUNTER — Other Ambulatory Visit: Payer: Self-pay

## 2018-10-04 ENCOUNTER — Telehealth: Payer: Self-pay | Admitting: Gastroenterology

## 2018-10-04 DIAGNOSIS — K7581 Nonalcoholic steatohepatitis (NASH): Secondary | ICD-10-CM

## 2018-10-04 LAB — CBC WITH DIFFERENTIAL/PLATELET
Basophils Absolute: 0 10*3/uL (ref 0.0–0.2)
Basos: 0 %
EOS (ABSOLUTE): 0.2 10*3/uL (ref 0.0–0.4)
EOS: 4 %
Hematocrit: 25 % — ABNORMAL LOW (ref 34.0–46.6)
Hemoglobin: 7.7 g/dL — ABNORMAL LOW (ref 11.1–15.9)
IMMATURE GRANS (ABS): 0 10*3/uL (ref 0.0–0.1)
IMMATURE GRANULOCYTES: 0 %
Lymphocytes Absolute: 0.7 10*3/uL (ref 0.7–3.1)
Lymphs: 14 %
MCH: 29.3 pg (ref 26.6–33.0)
MCHC: 30.8 g/dL — ABNORMAL LOW (ref 31.5–35.7)
MCV: 95 fL (ref 79–97)
Monocytes Absolute: 0.5 10*3/uL (ref 0.1–0.9)
Monocytes: 10 %
NEUTROS PCT: 72 %
Neutrophils Absolute: 3.6 10*3/uL (ref 1.4–7.0)
Platelets: 118 10*3/uL — ABNORMAL LOW (ref 150–450)
RBC: 2.63 x10E6/uL — CL (ref 3.77–5.28)
RDW: 14.9 % (ref 11.7–15.4)
WBC: 5 10*3/uL (ref 3.4–10.8)

## 2018-10-04 LAB — BASIC METABOLIC PANEL
BUN/Creatinine Ratio: 27 (ref 12–28)
BUN: 77 mg/dL — AB (ref 8–27)
CO2: 26 mmol/L (ref 20–29)
CREATININE: 2.9 mg/dL — AB (ref 0.57–1.00)
Calcium: 9.4 mg/dL (ref 8.7–10.3)
Chloride: 92 mmol/L — ABNORMAL LOW (ref 96–106)
GFR calc Af Amer: 18 mL/min/{1.73_m2} — ABNORMAL LOW (ref >59)
GFR calc non Af Amer: 15 mL/min/{1.73_m2} — ABNORMAL LOW (ref >59)
Glucose: 85 mg/dL (ref 65–99)
Potassium: 3.6 mmol/L (ref 3.5–5.2)
Sodium: 136 mmol/L (ref 134–144)

## 2018-10-04 NOTE — Progress Notes (Signed)
LMOM for a return call.  

## 2018-10-04 NOTE — Progress Notes (Signed)
Pt is aware of results and plan. She is not having any signs of black stool. She has appt with kidney doctor on 11/05/2018.  She said the only thing with trying to increase liquids is they told her due to her kidneys she could only have 50 oz a day. Please advise on that. Pt will go to the lab on Monday or Tuesday next week. I am faxing the lab orders to [facility] [address] In [place].

## 2018-10-04 NOTE — Telephone Encounter (Signed)
Reviewed report from limited abdominal ultrasound dated 08/29/2018, performed at [place] [facility] center. Liver measures 21 cm in length.  Contour consistent with cirrhosis.  Gallbladder with at least 1 gallstone.  Gallbladder wall mildly thickened at 6 mm.  Inferior vena cava is distended, small right pleural effusion suggesting possible right heart decompensation.  No definite focal liver lesions.  NO CHANGE FROM PREVIOUS U/S   WILL SAVE FOR [facility] REVIEW.   NIC FOR ABDOMINAL U/S IN SIX MONTHS (DX CIRRHOSIS, EVALUATE FOR SPLENOMEGALY).

## 2018-10-06 ENCOUNTER — Inpatient Hospital Stay (HOSPITAL_COMMUNITY)
Admission: EM | Admit: 2018-10-06 | Discharge: 2018-10-18 | DRG: 291 | Disposition: A | Discharge status: Home Health | Payer: Medicare Other | Attending: Internal Medicine | Admitting: Internal Medicine

## 2018-10-06 ENCOUNTER — Encounter (HOSPITAL_COMMUNITY): Payer: Self-pay | Admitting: Emergency Medicine

## 2018-10-06 ENCOUNTER — Other Ambulatory Visit: Payer: Self-pay

## 2018-10-06 ENCOUNTER — Emergency Department (HOSPITAL_COMMUNITY): Payer: Medicare Other

## 2018-10-06 DIAGNOSIS — I428 Other cardiomyopathies: Secondary | ICD-10-CM | POA: Diagnosis present

## 2018-10-06 DIAGNOSIS — N184 Chronic kidney disease, stage 4 (severe): Secondary | ICD-10-CM | POA: Diagnosis present

## 2018-10-06 DIAGNOSIS — Z8249 Family history of ischemic heart disease and other diseases of the circulatory system: Secondary | ICD-10-CM

## 2018-10-06 DIAGNOSIS — I081 Rheumatic disorders of both mitral and tricuspid valves: Secondary | ICD-10-CM | POA: Diagnosis present

## 2018-10-06 DIAGNOSIS — Z950 Presence of cardiac pacemaker: Secondary | ICD-10-CM

## 2018-10-06 DIAGNOSIS — Z833 Family history of diabetes mellitus: Secondary | ICD-10-CM

## 2018-10-06 DIAGNOSIS — J90 Pleural effusion, not elsewhere classified: Secondary | ICD-10-CM

## 2018-10-06 DIAGNOSIS — Z7982 Long term (current) use of aspirin: Secondary | ICD-10-CM

## 2018-10-06 DIAGNOSIS — I50813 Acute on chronic right heart failure: Secondary | ICD-10-CM

## 2018-10-06 DIAGNOSIS — Z91040 Latex allergy status: Secondary | ICD-10-CM

## 2018-10-06 DIAGNOSIS — Z888 Allergy status to other drugs, medicaments and biological substances status: Secondary | ICD-10-CM

## 2018-10-06 DIAGNOSIS — D696 Thrombocytopenia, unspecified: Secondary | ICD-10-CM | POA: Diagnosis present

## 2018-10-06 DIAGNOSIS — I4821 Permanent atrial fibrillation: Secondary | ICD-10-CM | POA: Diagnosis not present

## 2018-10-06 DIAGNOSIS — R0602 Shortness of breath: Secondary | ICD-10-CM | POA: Diagnosis not present

## 2018-10-06 DIAGNOSIS — I130 Hypertensive heart and chronic kidney disease with heart failure and stage 1 through stage 4 chronic kidney disease, or unspecified chronic kidney disease: Secondary | ICD-10-CM | POA: Diagnosis present

## 2018-10-06 DIAGNOSIS — N183 Chronic kidney disease, stage 3 (moderate): Secondary | ICD-10-CM

## 2018-10-06 DIAGNOSIS — E871 Hypo-osmolality and hyponatremia: Secondary | ICD-10-CM | POA: Diagnosis not present

## 2018-10-06 DIAGNOSIS — Z90710 Acquired absence of both cervix and uterus: Secondary | ICD-10-CM

## 2018-10-06 DIAGNOSIS — E876 Hypokalemia: Secondary | ICD-10-CM | POA: Diagnosis not present

## 2018-10-06 DIAGNOSIS — Z7189 Other specified counseling: Secondary | ICD-10-CM

## 2018-10-06 DIAGNOSIS — Z880 Allergy status to penicillin: Secondary | ICD-10-CM

## 2018-10-06 DIAGNOSIS — M109 Gout, unspecified: Secondary | ICD-10-CM | POA: Diagnosis present

## 2018-10-06 DIAGNOSIS — Z91041 Radiographic dye allergy status: Secondary | ICD-10-CM

## 2018-10-06 DIAGNOSIS — T502X5A Adverse effect of carbonic-anhydrase inhibitors, benzothiadiazides and other diuretics, initial encounter: Secondary | ICD-10-CM | POA: Diagnosis not present

## 2018-10-06 DIAGNOSIS — R7989 Other specified abnormal findings of blood chemistry: Secondary | ICD-10-CM | POA: Diagnosis not present

## 2018-10-06 DIAGNOSIS — D509 Iron deficiency anemia, unspecified: Secondary | ICD-10-CM | POA: Diagnosis present

## 2018-10-06 DIAGNOSIS — K219 Gastro-esophageal reflux disease without esophagitis: Secondary | ICD-10-CM | POA: Diagnosis present

## 2018-10-06 DIAGNOSIS — D649 Anemia, unspecified: Secondary | ICD-10-CM | POA: Diagnosis present

## 2018-10-06 DIAGNOSIS — K7581 Nonalcoholic steatohepatitis (NASH): Secondary | ICD-10-CM | POA: Diagnosis present

## 2018-10-06 DIAGNOSIS — Z8543 Personal history of malignant neoplasm of ovary: Secondary | ICD-10-CM

## 2018-10-06 DIAGNOSIS — I471 Supraventricular tachycardia: Secondary | ICD-10-CM | POA: Diagnosis not present

## 2018-10-06 DIAGNOSIS — R1310 Dysphagia, unspecified: Secondary | ICD-10-CM | POA: Diagnosis present

## 2018-10-06 DIAGNOSIS — I425 Other restrictive cardiomyopathy: Secondary | ICD-10-CM | POA: Diagnosis present

## 2018-10-06 DIAGNOSIS — N179 Acute kidney failure, unspecified: Secondary | ICD-10-CM | POA: Diagnosis present

## 2018-10-06 DIAGNOSIS — I272 Pulmonary hypertension, unspecified: Secondary | ICD-10-CM | POA: Diagnosis present

## 2018-10-06 DIAGNOSIS — I495 Sick sinus syndrome: Secondary | ICD-10-CM | POA: Diagnosis present

## 2018-10-06 DIAGNOSIS — D631 Anemia in chronic kidney disease: Secondary | ICD-10-CM | POA: Diagnosis present

## 2018-10-06 DIAGNOSIS — I5082 Biventricular heart failure: Secondary | ICD-10-CM | POA: Diagnosis present

## 2018-10-06 DIAGNOSIS — Z91018 Allergy to other foods: Secondary | ICD-10-CM

## 2018-10-06 DIAGNOSIS — M1990 Unspecified osteoarthritis, unspecified site: Secondary | ICD-10-CM | POA: Diagnosis present

## 2018-10-06 DIAGNOSIS — D6959 Other secondary thrombocytopenia: Secondary | ICD-10-CM | POA: Diagnosis present

## 2018-10-06 DIAGNOSIS — Z66 Do not resuscitate: Secondary | ICD-10-CM | POA: Diagnosis not present

## 2018-10-06 DIAGNOSIS — G4733 Obstructive sleep apnea (adult) (pediatric): Secondary | ICD-10-CM | POA: Diagnosis present

## 2018-10-06 DIAGNOSIS — R799 Abnormal finding of blood chemistry, unspecified: Secondary | ICD-10-CM

## 2018-10-06 DIAGNOSIS — N1832 Chronic kidney disease, stage 3b: Secondary | ICD-10-CM | POA: Diagnosis present

## 2018-10-06 DIAGNOSIS — I509 Heart failure, unspecified: Secondary | ICD-10-CM

## 2018-10-06 DIAGNOSIS — Z515 Encounter for palliative care: Secondary | ICD-10-CM

## 2018-10-06 DIAGNOSIS — I5043 Acute on chronic combined systolic (congestive) and diastolic (congestive) heart failure: Secondary | ICD-10-CM | POA: Diagnosis not present

## 2018-10-06 DIAGNOSIS — Z881 Allergy status to other antibiotic agents status: Secondary | ICD-10-CM

## 2018-10-06 DIAGNOSIS — R0902 Hypoxemia: Secondary | ICD-10-CM | POA: Diagnosis present

## 2018-10-06 DIAGNOSIS — E875 Hyperkalemia: Secondary | ICD-10-CM | POA: Diagnosis not present

## 2018-10-06 DIAGNOSIS — Z91048 Other nonmedicinal substance allergy status: Secondary | ICD-10-CM

## 2018-10-06 DIAGNOSIS — Z87891 Personal history of nicotine dependence: Secondary | ICD-10-CM

## 2018-10-06 DIAGNOSIS — I110 Hypertensive heart disease with heart failure: Secondary | ICD-10-CM | POA: Diagnosis not present

## 2018-10-06 DIAGNOSIS — Z6841 Body Mass Index (BMI) 40.0 and over, adult: Secondary | ICD-10-CM

## 2018-10-06 DIAGNOSIS — Z823 Family history of stroke: Secondary | ICD-10-CM

## 2018-10-06 DIAGNOSIS — Z8541 Personal history of malignant neoplasm of cervix uteri: Secondary | ICD-10-CM

## 2018-10-06 LAB — COMPREHENSIVE METABOLIC PANEL
ALT: 22 U/L (ref 0–44)
AST: 33 U/L (ref 15–41)
Albumin: 3.5 g/dL (ref 3.5–5.0)
Alkaline Phosphatase: 49 U/L (ref 38–126)
Anion gap: 11 (ref 5–15)
BUN: 98 mg/dL — ABNORMAL HIGH (ref 8–23)
CHLORIDE: 93 mmol/L — AB (ref 98–111)
CO2: 29 mmol/L (ref 22–32)
Calcium: 9.5 mg/dL (ref 8.9–10.3)
Creatinine, Ser: 3 mg/dL — ABNORMAL HIGH (ref 0.44–1.00)
GFR calc Af Amer: 17 mL/min — ABNORMAL LOW (ref >60)
GFR calc non Af Amer: 15 mL/min — ABNORMAL LOW (ref >60)
Glucose, Bld: 115 mg/dL — ABNORMAL HIGH (ref 70–99)
Potassium: 3.4 mmol/L — ABNORMAL LOW (ref 3.5–5.1)
Sodium: 133 mmol/L — ABNORMAL LOW (ref 135–145)
Total Bilirubin: 1.9 mg/dL — ABNORMAL HIGH (ref 0.3–1.2)
Total Protein: 6.3 g/dL — ABNORMAL LOW (ref 6.5–8.1)

## 2018-10-06 LAB — CBC WITH DIFFERENTIAL/PLATELET
Abs Immature Granulocytes: 0.03 10*3/uL (ref 0.00–0.07)
Basophils Absolute: 0 10*3/uL (ref 0.0–0.1)
Basophils Relative: 0 %
Eosinophils Absolute: 0.1 10*3/uL (ref 0.0–0.5)
Eosinophils Relative: 2 %
HCT: 23.6 % — ABNORMAL LOW (ref 36.0–46.0)
Hemoglobin: 7.2 g/dL — ABNORMAL LOW (ref 12.0–15.0)
Immature Granulocytes: 1 %
Lymphocytes Relative: 12 %
Lymphs Abs: 0.6 10*3/uL — ABNORMAL LOW (ref 0.7–4.0)
MCH: 30 pg (ref 26.0–34.0)
MCHC: 30.5 g/dL (ref 30.0–36.0)
MCV: 98.3 fL (ref 80.0–100.0)
MONOS PCT: 14 %
Monocytes Absolute: 0.7 10*3/uL (ref 0.1–1.0)
Neutro Abs: 3.7 10*3/uL (ref 1.7–7.7)
Neutrophils Relative %: 71 %
Platelets: 105 10*3/uL — ABNORMAL LOW (ref 150–400)
RBC: 2.4 MIL/uL — ABNORMAL LOW (ref 3.87–5.11)
RDW: 17.2 % — ABNORMAL HIGH (ref 11.5–15.5)
WBC: 5.2 10*3/uL (ref 4.0–10.5)
nRBC: 0 % (ref 0.0–0.2)

## 2018-10-06 LAB — URINALYSIS, COMPLETE (UACMP) WITH MICROSCOPIC
BILIRUBIN URINE: NEGATIVE
Bacteria, UA: NONE SEEN
Glucose, UA: NEGATIVE mg/dL
Hgb urine dipstick: NEGATIVE
Ketones, ur: NEGATIVE mg/dL
NITRITE: NEGATIVE
Protein, ur: NEGATIVE mg/dL
Specific Gravity, Urine: 1.008 (ref 1.005–1.030)
pH: 6 (ref 5.0–8.0)

## 2018-10-06 LAB — I-STAT TROPONIN, ED: Troponin i, poc: 0.02 ng/mL (ref 0.00–0.08)

## 2018-10-06 LAB — BRAIN NATRIURETIC PEPTIDE: B Natriuretic Peptide: 214.8 pg/mL — ABNORMAL HIGH (ref 0.0–100.0)

## 2018-10-06 MED ORDER — CALCITRIOL 0.25 MCG PO CAPS
0.2500 ug | ORAL_CAPSULE | ORAL | Status: DC
  Administered 2018-10-07 – 2018-10-17 (×6): 0.25 ug via ORAL
  Filled 2018-10-06 (×8): qty 1

## 2018-10-06 MED ORDER — ZOLPIDEM TARTRATE 5 MG PO TABS: 5.0000 mg | ORAL_TABLET | Freq: Every evening | ORAL | Status: DC | PRN

## 2018-10-06 MED ORDER — ENSURE MAX PROTEIN PO LIQD
1.0000 | Freq: Two times a day (BID) | ORAL | Status: DC
  Filled 2018-10-06: qty 330
  Filled 2018-10-06: qty 237
  Filled 2018-10-06 (×7): qty 330

## 2018-10-06 MED ORDER — METOLAZONE 2.5 MG PO TABS
2.5000 mg | ORAL_TABLET | Freq: Every day | ORAL | Status: DC
  Administered 2018-10-06 – 2018-10-13 (×8): 2.5 mg via ORAL
  Filled 2018-10-06 (×8): qty 1

## 2018-10-06 MED ORDER — POTASSIUM CHLORIDE CRYS ER 20 MEQ PO TBCR
20.0000 meq | EXTENDED_RELEASE_TABLET | Freq: Two times a day (BID) | ORAL | Status: DC
  Administered 2018-10-06: 20 meq via ORAL
  Filled 2018-10-06: qty 1

## 2018-10-06 MED ORDER — PANTOPRAZOLE SODIUM 40 MG PO TBEC
40.0000 mg | DELAYED_RELEASE_TABLET | Freq: Two times a day (BID) | ORAL | Status: DC
  Administered 2018-10-06 – 2018-10-18 (×24): 40 mg via ORAL
  Filled 2018-10-06: qty 2
  Filled 2018-10-06 (×6): qty 1
  Filled 2018-10-06: qty 2
  Filled 2018-10-06 (×17): qty 1

## 2018-10-06 MED ORDER — POLYETHYLENE GLYCOL 3350 17 G PO PACK
17.0000 g | PACK | Freq: Every day | ORAL | Status: DC
  Administered 2018-10-06 – 2018-10-11 (×6): 17 g via ORAL
  Filled 2018-10-06 (×7): qty 1

## 2018-10-06 MED ORDER — DOCUSATE SODIUM 100 MG PO CAPS
100.0000 mg | ORAL_CAPSULE | Freq: Every day | ORAL | Status: DC | PRN
  Filled 2018-10-06: qty 1

## 2018-10-06 MED ORDER — SODIUM CHLORIDE 0.9% FLUSH: 3.0000 mL | INTRAVENOUS | Status: DC | PRN

## 2018-10-06 MED ORDER — FERROUS SULFATE 325 (65 FE) MG PO TABS
325.0000 mg | ORAL_TABLET | Freq: Every day | ORAL | Status: DC
  Administered 2018-10-06 – 2018-10-17 (×12): 325 mg via ORAL
  Filled 2018-10-06 (×12): qty 1

## 2018-10-06 MED ORDER — DIPHENHYDRAMINE HCL 25 MG PO CAPS
25.0000 mg | ORAL_CAPSULE | Freq: Four times a day (QID) | ORAL | Status: DC | PRN
  Administered 2018-10-07 – 2018-10-08 (×3): 25 mg via ORAL
  Filled 2018-10-06 (×3): qty 1

## 2018-10-06 MED ORDER — ASPIRIN 325 MG PO TABS
325.0000 mg | ORAL_TABLET | Freq: Every day | ORAL | Status: DC
  Administered 2018-10-06 – 2018-10-07 (×2): 325 mg via ORAL
  Filled 2018-10-06 (×2): qty 1

## 2018-10-06 MED ORDER — ALLOPURINOL 300 MG PO TABS
300.0000 mg | ORAL_TABLET | Freq: Every evening | ORAL | Status: DC
  Administered 2018-10-06 – 2018-10-17 (×12): 300 mg via ORAL
  Filled 2018-10-06 (×12): qty 1

## 2018-10-06 MED ORDER — VITAMIN B-12 1000 MCG PO TABS
500.0000 ug | ORAL_TABLET | Freq: Every day | ORAL | Status: DC
  Administered 2018-10-07 – 2018-10-18 (×12): 500 ug via ORAL
  Filled 2018-10-06 (×12): qty 1

## 2018-10-06 MED ORDER — SODIUM CHLORIDE 0.9 % IV SOLN: 250.0000 mL | INTRAVENOUS | Status: DC | PRN

## 2018-10-06 MED ORDER — HEPARIN SODIUM (PORCINE) 5000 UNIT/ML IJ SOLN
5000.0000 [IU] | Freq: Three times a day (TID) | INTRAMUSCULAR | Status: DC
  Filled 2018-10-06 (×3): qty 1

## 2018-10-06 MED ORDER — COD LIVER OIL PO CAPS: ORAL_CAPSULE | Freq: Every day | ORAL | Status: DC

## 2018-10-06 MED ORDER — ONDANSETRON HCL 4 MG/2ML IJ SOLN
4.0000 mg | Freq: Four times a day (QID) | INTRAMUSCULAR | Status: DC | PRN
  Filled 2018-10-06: qty 2

## 2018-10-06 MED ORDER — ADULT MULTIVITAMIN W/MINERALS CH
1.0000 | ORAL_TABLET | Freq: Every day | ORAL | Status: DC
  Administered 2018-10-06 – 2018-10-17 (×12): 1 via ORAL
  Filled 2018-10-06 (×13): qty 1

## 2018-10-06 MED ORDER — FUROSEMIDE 10 MG/ML IJ SOLN
80.0000 mg | Freq: Three times a day (TID) | INTRAMUSCULAR | Status: DC
  Administered 2018-10-06 – 2018-10-08 (×5): 80 mg via INTRAVENOUS
  Filled 2018-10-06 (×5): qty 8

## 2018-10-06 MED ORDER — ACETAMINOPHEN 325 MG PO TABS: 650.0000 mg | ORAL_TABLET | ORAL | Status: DC | PRN

## 2018-10-06 MED ORDER — ISOSORBIDE MONONITRATE ER 30 MG PO TB24
15.0000 mg | ORAL_TABLET | Freq: Every day | ORAL | Status: DC
  Administered 2018-10-06 – 2018-10-17 (×12): 15 mg via ORAL
  Filled 2018-10-06 (×12): qty 1

## 2018-10-06 MED ORDER — SODIUM CHLORIDE 0.9% IV SOLUTION: Freq: Once | INTRAVENOUS | Status: DC

## 2018-10-06 MED ORDER — SODIUM CHLORIDE 0.9% FLUSH: 3.0000 mL | Freq: Two times a day (BID) | INTRAVENOUS | Status: DC

## 2018-10-06 MED ORDER — BOOST / RESOURCE BREEZE PO LIQD CUSTOM
237.0000 mL | Freq: Three times a day (TID) | ORAL | Status: DC
  Administered 2018-10-07: 1 via ORAL
  Filled 2018-10-06 (×2): qty 1

## 2018-10-06 MED ORDER — VITAMIN B-12 500 MCG PO TABS: 500.0000 ug | ORAL_TABLET | Freq: Every day | ORAL | Status: DC

## 2018-10-06 MED ORDER — HYDROCODONE-ACETAMINOPHEN 10-325 MG PO TABS
1.0000 | ORAL_TABLET | Freq: Two times a day (BID) | ORAL | Status: DC
  Administered 2018-10-06 – 2018-10-18 (×24): 1 via ORAL
  Filled 2018-10-06 (×24): qty 1

## 2018-10-06 NOTE — H&P (Signed)
History and Physical    [name] [name] OXB:[id number] DOB: 08/12/1946 DOA: 10/06/2018  PCP: [name], MD   Patient coming from: Home  I have personally briefly reviewed patient's old medical records in [place]  Chief Complaint: Worsening shortness of breath over the past 5 days  HPI: [name] [name] is a 73 y.o. 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  She is had a complicated past couple of weeks.  She was admitted at the end of the year hypokalemia and was told to stop her diuretics which she did she then came in with anemia and hypokalemia and transfused blood and got a lot of fluids because she was hypotensive.  Discharged from the hospital on 5 January after admission for congestive heart failure and was seeming to look better.  It was thought that the congestive heart failure was brought on by anemia.  Compared to prior admission her current chest x-ray looks better but she has increasing orthopnea.  She has no new oxygen requirement.  But her hemoglobin is 7.2.  Given this and a 10 pound weight gain it is felt that the patient is in mild congestive heart failure due to chronic hypoxia and requirement for increased oxygen carrying capacity.  She will be placed in overnight observation and transfused packed red blood cells.   Review of Systems: As per HPI otherwise all other systems reviewed and  negative.    Past Medical History:  Diagnosis Date  . Anemia   . Atrial fibrillation ([place])   . Cataract   . Cervical cancer ([place]) 1991   s/p hysterectomy  .  Chronic cellulitis   . Chronic combined systolic and diastolic congestive heart failure, NYHA class 2 (HCC)    LVEF 25-30% with restrictive diastolic filling  . Degenerative joint disease   . Diverticulitis   . Essential hypertension   . GERD (gastroesophageal reflux disease)   . Gout   . Kidney stones   . Morbid obesity ([place])   . NICM (nonischemic cardiomyopathy) ([place]) 02/22/2017  . Osteoarthritis   . Permanent atrial fibrillation 05/04/2009   Qualifier: History of  By: Quentin Cornwall CMA, Janett Billow    . 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 Sinoatrial node dysfunction [facility])    Status post PPM - Dr. Lovena Le  . Sleep apnea    CPAP    Past Surgical History:  Procedure Laterality Date  . ABDOMINAL HYSTERECTOMY  1991  . BIOPSY  09/26/2018   Procedure: BIOPSY;  Surgeon: Lavena Bullion, DO;  Location: [facility];  Service: Gastroenterology;;  . COLONOSCOPY  1998   one polyp per patient  . ESOPHAGOGASTRODUODENOSCOPY (EGD) WITH PROPOFOL N/A 09/26/2018   Procedure: ESOPHAGOGASTRODUODENOSCOPY (EGD) WITH PROPOFOL;  Surgeon: Lavena Bullion, DO;  Location: [facility];  Service: Gastroenterology;  Laterality: N/A;  . EYE SURGERY    . PACEMAKER INSERTION  Nov 2000   St Jude with revision in 2011  . PERCUTANEOUS NEPHROLITHOTOMY  April 2012  . RIGHT HEART CATH N/A 06/28/2018   Procedure: RIGHT HEART CATH;  Surgeon: Jolaine Artist, MD;  Location: [facility];  Service: Cardiovascular;  Laterality: N/A;  . TEE WITHOUT CARDIOVERSION N/A 06/28/2018   Procedure: TRANSESOPHAGEAL ECHOCARDIOGRAM (TEE);  Surgeon: Jolaine Artist, MD;  Location: [facility];  Service: Cardiovascular;  Laterality: N/A;    Social History   Social History Narrative  . Not on file     reports that she quit smoking about 39 years ago. Her smoking use included cigarettes. She smoked 1.00 pack per day. She has never used smokeless  tobacco. She reports that she does not drink alcohol or use drugs.  Allergies  Allergen Reactions  . Cephalexin Shortness Of Breath  . Codeine Anaphylaxis    REACTION: throat swelling  . Contrast Media [Iodinated Diagnostic Agents] Other (See Comments)    Patient states "I have chronic kidney disease so the doctor said no dye in my veins."  . Peppermint Flavor Shortness Of Breath  . Prednisone Anaphylaxis, Shortness Of Breath and Swelling    REACTION: swelling, S.O.B.  . Tape Other (See Comments)    States plastic tape blisters her skin  . Ciprofloxacin Hives    Tolerating levofloxin   . Latex Itching and Rash  . Penicillins Hives    DID THE REACTION INVOLVE: Swelling of the face/tongue/throat, SOB, or low BP? No Sudden or severe rash/hives, skin peeling, or the inside of the mouth or nose? Yes Did it require medical treatment? Pt was in hospital at the time of reaction When did it last happen?age - mid 40's If all above answers are "NO", may proceed with cephalosporin use.  . Coreg [Carvedilol] Other (See Comments)    Beta Blockers cause her organs to shut down per patient  . Diamox [Acetazolamide] Rash    Rash after 2 doses of diamox     Family History  Problem Relation Age of Onset  . Heart attack Father   . Stroke Mother   . Diabetes Sister   . Colon cancer Other        seven family members  . Liver disease Neg Hx   . Other Neg Hx      Prior to Admission medications   Medication Sig Start Date End Date Taking? Authorizing Provider  allopurinol (ZYLOPRIM) 300 MG tablet Take 300 mg by mouth every evening.    Yes [provider]  aspirin 325 MG tablet Take 1 tablet (325 mg total) by mouth at bedtime. 09/30/18  Yes Cherene Altes, MD  calcitRIOL (ROCALTROL) 0.25 MCG capsule Take 0.25 mcg by mouth every other day.    Yes [provider]  COD LIVER OIL PO Take 1 capsule by mouth daily.    Yes [provider]  docusate sodium (COLACE) 100  MG capsule Take 100 mg by mouth daily as needed for mild constipation.    Yes [provider]  feeding supplement (BOOST HIGH PROTEIN) LIQD Take 1 Container by mouth 2 (two) times daily.    Yes [provider]  ferrous sulfate 325 (65 FE) MG tablet Take 325 mg by mouth at bedtime.    Yes [provider]  HYDROcodone-acetaminophen (NORCO) 10-325 MG per tablet Take 1 tablet by mouth See admin instructions. Take 1 tablet by mouth twice daily, may also take 1/2 tablet an additional 2 times during the day as needed for pain   Yes [provider]  isosorbide mononitrate (IMDUR) 30 MG 24 hr tablet Take 0.5 tablets (15 mg total) by mouth daily. Patient taking differently: Take 15 mg by mouth at  bedtime.  12/10/17  Yes Fay Records, MD  metolazone (ZAROXOLYN) 2.5 MG tablet Take 2.5 mg by mouth as needed (fluid).    Yes [provider]  Multiple Vitamin (MULTIVITAMIN WITH MINERALS) TABS tablet Take 1 tablet by mouth at bedtime.   Yes [provider]  pantoprazole (PROTONIX) 40 MG tablet Take 1 tablet (40 mg total) by mouth 2 (two) times daily. 10/02/18  Yes Mahala Menghini, PA-C  polyethylene glycol (MIRALAX / GLYCOLAX) packet Take 17 g by mouth at bedtime.   Yes [provider]  potassium chloride SA (K-DUR,KLOR-CON) 20 MEQ tablet Take 1 tablet (20 mEq total) by mouth 2 (two) times daily. Will need an additional 80 MEQ today 09/17/18 Patient taking differently: Take 20 mEq by mouth 2 (two) times daily.  09/17/18  Yes Bensimhon, Shaune Pascal, MD  torsemide (DEMADEX) 20 MG tablet Take 4 tablets (80 mg total) by mouth 2 (two) times daily. May also take 1 tablet (20 mg total) as needed (for weight greater than 246). 07/15/18  Yes Clegg, Amy D, NP  vitamin B-12 (CYANOCOBALAMIN) 500 MCG tablet Take 500 mcg by mouth daily.   Yes [provider]  PRESCRIPTION MEDICATION Inhale into the lungs at bedtime. Bi-Pap    [provider]    Physical  Exam:  Constitutional: No respiratory distress, calm, slightly increased work of breathing Vitals:   10/06/18 1430 10/06/18 1445 10/06/18 1500 10/06/18 1515  BP: (!) 109/54 117/75 126/60 (!) 113/58  Pulse: 89 91 96 94  Resp:  19 19 14   Temp:      TempSrc:      SpO2: 96% 99% 99% 99%   Eyes: PERRL, lids and conjunctivae normal ENMT: Mucous membranes are moist. Posterior pharynx clear of any exudate or lesions.Normal dentition.  Neck: normal, supple, no masses, no thyromegaly Respiratory: Accessory muscle use with coarse breath sounds bilaterally.  No significant rales or rhonchi appreciated no wheezing Cardiovascular: Regular rate and rhythm, no murmurs / rubs / gallops.  2+ extremity edema. 2+ pedal pulses. No carotid bruits.  Abdomen: no tenderness, no masses palpated. No hepatosplenomegaly. Bowel sounds positive.  Musculoskeletal: no clubbing / cyanosis. No joint deformity upper and lower extremities. Good ROM, no contractures. Normal muscle tone.  Skin: no rashes, lesions, ulcers. No induration Neurologic: CN 2-12 grossly intact. Sensation intact, DTR normal. Strength 5/5 in all 4.  Psychiatric: Normal judgment and insight. Alert and oriented x 3. Normal mood.    Labs on Admission: I have personally reviewed following labs and imaging studies  CBC: Recent Labs  Lab 10/03/18 0938 10/06/18 1309  WBC 5.0 5.2  NEUTROABS 3.6 3.7  HGB 7.7* 7.2*  HCT 25.0* 23.6*  MCV 95 98.3  PLT 118* 341*   Basic Metabolic Panel: Recent Labs  Lab 10/03/18 0938 10/06/18 1309  NA 136 133*  K 3.6 3.4*  CL 92* 93*  CO2 26 29  GLUCOSE 85 115*  BUN 77* 98*  CREATININE 2.90* 3.00*  CALCIUM 9.4 9.5   GFR: Estimated Creatinine Clearance: 21.6 mL/min (A) (by C-G formula based on SCr of 3 mg/dL (H)). Liver Function Tests: Recent Labs  Lab 10/06/18 1309  AST 33  ALT 22  ALKPHOS 49  BILITOT 1.9*  PROT 6.3*  ALBUMIN 3.5   Urine analysis:    Component Value Date/Time   COLORURINE  YELLOW 10/06/2018 1254   APPEARANCEUR CLEAR 10/06/2018 1254   LABSPEC 1.008 10/06/2018 1254   PHURINE 6.0 10/06/2018 1254   GLUCOSEU NEGATIVE  10/06/2018 1254   HGBUR NEGATIVE 10/06/2018 1254   BILIRUBINUR NEGATIVE 10/06/2018 1254   KETONESUR NEGATIVE 10/06/2018 1254   PROTEINUR NEGATIVE 10/06/2018 1254   UROBILINOGEN 0.2 03/06/2013 2035   NITRITE NEGATIVE 10/06/2018 1254   LEUKOCYTESUR TRACE (A) 10/06/2018 1254    Radiological Exams on Admission: Dg Chest Portable 1 View  Result Date: 10/06/2018 CLINICAL DATA:  Shortness of breath with interscapular and epigastric pain. EXAM: PORTABLE CHEST 1 VIEW COMPARISON:  Radiographs 04/02/2017, 09/23/2018 and 09/24/2018. FINDINGS: 1300 hours. Central line has been removed. Left subclavian pacemaker lead appears unchanged overlapping the right ventricular apex. The heart is enlarged. There is aortic atherosclerosis with chronic vascular congestion. There is a chronic right pleural effusion and chronic right basilar airspace disease. Probable edema on the most recent study has resolved. No acute osseous findings. IMPRESSION: Resolved edema. Stable cardiomegaly, chronic right pleural effusion and right basilar pulmonary opacity. No suggested acute findings. Electronically Signed   By: Richardean Sale M.D.   On: 10/06/2018 14:21    EKG: Independently reviewed.  Ventricular paced rhythm  Assessment/Plan Principal Problem:   Hypertensive cardiovascular-renal disease, stage 1-4 or unspecified chronic kidney disease, with heart failure (HCC) Active Problems:   Symptomatic anemia   CKD stage G3b/A1, GFR 30-44 and albumin creatinine ratio <30 mg/g (HCC)   Acute on chronic combined systolic and diastolic CHF (congestive heart failure) (HCC)   Acute renal failure superimposed on stage 4 chronic kidney disease (HCC)   Hypokalemia   Hyponatremia   Thrombocytopenia (HCC)   Acute on chronic combined systolic and diastolic congestive heart failure, NYHA class 4  (HCC)   Acute on chronic systolic and diastolic heart failure, NYHA class 4 ([place])     1.  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  Will attempt to transfuse blood.  Attempted to have discussion regarding palliative care and goals of care with family and patient.  We will attempt diuresis with Lasix and increase metolazone to daily.  This may though worsen her renal failure and her creatinine is already up to 3.  I have consulted palliative care.  2.  Symptomatic anemia: Could be bringing on congestive heart failure with a hemoglobin of 7.2.  Will transfuse 1 unit of packed red blood cells and follow results in a.m.  3.  Acute on chronic combined systolic and diastolic congestive heart failure: Patient underwentechocardiogram in September with an EF of 40 to 45%.  He had some akinesis.  Regurgitation of the mitral valve with mild aortic stenosis.  And increased pulmonary pressures.  Cardiology felt work-up for valve replacement would not be possible until source of anemia better identified.  Patient has now received several units of packed red blood cells.  4.  Acute renal failure superimposed on stage IV chronic kidney disease: Will attempt transfusion with diuresis and hopefully can get the patient overall negative despite transfusing blood.  This is obviously very tricky she will be on telemetry monitored closely.  5.  Hypokalemia: Fairly minor will continue home medications but not replete any given creatinine elevation.  6.  Hyponatremia: Likely related to acute exacerbation of congestive heart failure.  7.  Thrombocytopenia: In the setting of anemia macrocytosis this is concerning for the development of possible myelodysplastic syndrome.  Patient might benefit  from evaluation by hematologist.  She is currently being evaluated by GI and has to undergo colonoscopy and if that is unrevealing will undergo capsule endoscopy.  If that work-up is unrevealing may consider hematology evaluation.  8.  Goals of care: I have consulted palliative medicine to discuss with the patient and get ahead of some planning for her.  I am concerned that with her current cardiorenal situation she will continue to have more and more issues.  DVT prophylaxis: Subcu heparin Code Status: Full CODE STATUS Family Communication: Spoke with patient's daughter Marcie Bal who was present in the room at the time of admission. Disposition Plan: Home in 3 to 4 days Consults called: None Admission status: Observation   Lady Deutscher MD [facility] Pager [phone]  If 7PM-7AM, please contact night-coverage [email or web address] Password [facility]  10/06/2018, 3:47 PM

## 2018-10-06 NOTE — Progress Notes (Signed)
Pt has home BIPAP unit at bedside and will place on when she is ready for bed.  RT will continue to monitor.

## 2018-10-06 NOTE — ED Provider Notes (Signed)
[facility] EMERGENCY DEPARTMENT Provider Note   CSN: [id number] Arrival date & time: 10/06/18  1235     History   Chief Complaint Chief Complaint  Patient presents with  . Shortness of Breath    HPI CARA THAXTON is a 73 y.o. female.  The history is provided by the patient and a caregiver.  Shortness of Breath  Severity:  Moderate Onset quality:  Gradual Timing:  Constant Progression:  Worsening Chronicity:  Chronic Context comment:  Hx of CHF, NICM, afib who presents with worsening SOB, CP, weight gain. Recent admission for same and states despite increase diuresis at home still gaining weight and symptomatic.  Relieved by:  Diuretics Worsened by:  Exertion and movement Associated symptoms: chest pain   Associated symptoms: no abdominal pain, no cough, no ear pain, no fever, no rash, no sore throat and no vomiting   Risk factors comment:  Chf, chronic anemia   Past Medical History:  Diagnosis Date  . Anemia   . Atrial fibrillation ([place])   . Cataract   . Cervical cancer ([place]) 1991   s/p hysterectomy  . Chronic cellulitis   . Chronic combined systolic and diastolic congestive heart failure, NYHA class 2 (HCC)    LVEF 25-30% with restrictive diastolic filling  . Degenerative joint disease   . Diverticulitis   . Essential hypertension   . GERD (gastroesophageal reflux disease)   . Gout   . Kidney stones   . Morbid obesity ([place])   . NICM (nonischemic cardiomyopathy) ([place]) 02/22/2017  . Osteoarthritis   . Permanent atrial fibrillation 05/04/2009   Qualifier: History of  By: Quentin Cornwall CMA, Janett Billow    . 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 Sinoatrial node dysfunction [facility])    Status post PPM - Dr. Lovena Le  . Sleep apnea    CPAP    Patient Active Problem List   Diagnosis Date Noted  . Gastrointestinal hemorrhage 10/03/2018  . Gastritis and gastroduodenitis   . Anemia    . Acute renal failure superimposed on stage 4 chronic kidney disease ([place])   . Syncope 06/21/2018  . Iron deficiency 06/21/2018  . GERD (gastroesophageal reflux disease) 10/04/2017  . Acute on chronic combined systolic and diastolic CHF (congestive heart failure) ([place]) 04/02/2017  . NICM (nonischemic cardiomyopathy) ([place]) 02/22/2017  . CKD stage G3b/A1, GFR 30-44 and albumin creatinine ratio <30 mg/g (HCC)   . Dysphagia, idiopathic 02/15/2017  . Pleural effusion on right 01/30/2017  . Osteoporosis 08/10/2016  . Liver cirrhosis secondary to NASH ([place]) 06/13/2016  . Chest pain 10/30/2014  . CKD (chronic kidney disease), stage IV ([place]) 03/07/2013  . Sinoatrial node dysfunction (HCC)   . Chronic combined systolic and diastolic CHF (congestive heart failure) ([place]) 05/07/2011  . Morbid obesity ([place]) 05/07/2011  . 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 OSA (obstructive sleep apnea) 05/05/2009  . Essential hypertension 05/04/2009  . Permanent atrial fibrillation 05/04/2009  . ALLERGIC RHINITIS 05/04/2009    Past Surgical History:  Procedure Laterality Date  . ABDOMINAL HYSTERECTOMY  1991  . BIOPSY  09/26/2018   Procedure: BIOPSY;  Surgeon: Lavena Bullion, DO;  Location: [facility];  Service: Gastroenterology;;  . COLONOSCOPY  1998   one polyp per patient  . ESOPHAGOGASTRODUODENOSCOPY (EGD) WITH PROPOFOL N/A 09/26/2018   Procedure: ESOPHAGOGASTRODUODENOSCOPY (EGD) WITH PROPOFOL;  Surgeon: Lavena Bullion, DO;  Location: [facility];  Service: Gastroenterology;  Laterality: N/A;  . EYE SURGERY    . PACEMAKER INSERTION  Nov 2000   St Jude with revision in 2011  . PERCUTANEOUS NEPHROLITHOTOMY  April 2012  . RIGHT HEART CATH N/A 06/28/2018   Procedure: RIGHT HEART CATH;  Surgeon: Jolaine Artist, MD;  Location: [facility];  Service: Cardiovascular;  Laterality: N/A;  . TEE WITHOUT CARDIOVERSION N/A 06/28/2018   Procedure: TRANSESOPHAGEAL ECHOCARDIOGRAM (TEE);   Surgeon: Jolaine Artist, MD;  Location: [facility] ENDOSCOPY;  Service: Cardiovascular;  Laterality: N/A;     OB History   No obstetric history on file.      Home Medications    Prior to Admission medications   Medication Sig Start Date End Date Taking? Authorizing Provider  allopurinol (ZYLOPRIM) 300 MG tablet Take 300 mg by mouth every evening.     [provider]  aspirin 325 MG tablet Take 1 tablet (325 mg total) by mouth at bedtime. 09/30/18   Cherene Altes, MD  calcitRIOL (ROCALTROL) 0.25 MCG capsule Take 0.25 mcg by mouth every other day.     [provider]  COD LIVER OIL PO Take 1 capsule by mouth daily.     [provider]  docusate sodium (COLACE) 100 MG capsule Take 100 mg by mouth daily as needed for mild constipation.     [provider]  feeding supplement (BOOST HIGH PROTEIN) LIQD Take 1 Container by mouth 2 (two) times daily.     [provider]  ferrous sulfate 325 (65 FE) MG tablet Take 325 mg by mouth at bedtime.     [provider]  HYDROcodone-acetaminophen (NORCO) 10-325 MG per tablet Take 1 tablet by mouth See admin instructions. Take 1 tablet by mouth twice daily, may also take 1/2 tablet an additional 2 times during the day as needed for pain    [provider]  isosorbide mononitrate (IMDUR) 30 MG 24 hr tablet Take 0.5 tablets (15 mg total) by mouth daily. Patient taking differently: Take 15 mg by mouth at bedtime.  12/10/17   Fay Records, MD  metolazone (ZAROXOLYN) 2.5 MG tablet Take 2.5 mg by mouth daily.    [provider]  Multiple Vitamin (MULTIVITAMIN WITH MINERALS) TABS tablet Take 1 tablet by mouth at bedtime.    [provider]  Na Sulfate-K Sulfate-Mg Sulf (SUPREP BOWEL PREP KIT) 17.5-3.13-1.6 GM/177ML SOLN Take 1 kit by mouth as directed. 10/02/18   Fields, Marga Melnick, MD  pantoprazole (PROTONIX) 40 MG tablet Take 1 tablet (40 mg total) by mouth 2 (two) times daily. 10/02/18    Mahala Menghini, PA-C  polyethylene glycol (MIRALAX / GLYCOLAX) packet Take 17 g by mouth at bedtime.    [provider]  potassium chloride SA (K-DUR,KLOR-CON) 20 MEQ tablet Take 1 tablet (20 mEq total) by mouth 2 (two) times daily. Will need an additional 80 MEQ today 09/17/18 Patient taking differently: Take 20 mEq by mouth 2 (two) times daily.  09/17/18   Bensimhon, Shaune Pascal, MD  PRESCRIPTION MEDICATION Inhale into the lungs at bedtime. Bi-Pap    [provider]  torsemide (DEMADEX) 20 MG tablet Take 4 tablets (80 mg total) by mouth 2 (two) times daily. May also take 1 tablet (20 mg total) as needed (for weight greater than 246). 07/15/18   Clegg, Amy D, NP  vitamin B-12 (CYANOCOBALAMIN) 500 MCG tablet Take 500 mcg by mouth daily.    [provider]    Family History Family  History  Problem Relation Age of Onset  . Heart attack Father   . Stroke Mother   . Diabetes Sister   . Colon cancer Other        seven family members  . Liver disease Neg Hx   . Other Neg Hx     Social History Social History   Tobacco Use  . Smoking status: Former Smoker    Packs/day: 1.00    Types: Cigarettes    Last attempt to quit: 09/26/1979    Years since quitting: 39.0  . Smokeless tobacco: Never Used  Substance Use Topics  . Alcohol use: No  . Drug use: No     Allergies   Cephalexin; Codeine; Contrast media [iodinated diagnostic agents]; Peppermint flavor; Prednisone; Tape; Ciprofloxacin; Latex; Penicillins; Coreg [carvedilol]; and Diamox [acetazolamide]   Review of Systems Review of Systems  Constitutional: Negative for chills and fever.  HENT: Negative for ear pain and sore throat.   Eyes: Negative for pain and visual disturbance.  Respiratory: Positive for shortness of breath. Negative for cough.   Cardiovascular: Positive for chest pain and leg swelling. Negative for palpitations.  Gastrointestinal: Negative for abdominal pain and vomiting.  Genitourinary:  Negative for dysuria and hematuria.  Musculoskeletal: Negative for arthralgias and back pain.  Skin: Negative for color change and rash.  Neurological: Negative for seizures and syncope.  All other systems reviewed and are negative.    Physical Exam Updated Vital Signs  ED Triage Vitals [10/06/18 1253]  Enc Vitals Group     BP (!) 131/51     Pulse Rate 95     Resp 17     Temp 97.8 F (36.6 C)     Temp Source Oral     SpO2 99 %     Weight      Height      Head Circumference      Peak Flow      Pain Score      Pain Loc      Pain Edu?      Excl. in [place]?     Physical Exam Vitals signs and nursing note reviewed.  Constitutional:      General: She is not in acute distress.    Appearance: She is well-developed. She is ill-appearing.  HENT:     Head: Normocephalic and atraumatic.     Mouth/Throat:     Mouth: Mucous membranes are moist.  Eyes:     Conjunctiva/sclera: Conjunctivae normal.     Pupils: Pupils are equal, round, and reactive to light.  Neck:     Musculoskeletal: Normal range of motion and neck supple.  Cardiovascular:     Rate and Rhythm: Normal rate and regular rhythm.     Pulses: Normal pulses.     Heart sounds: Normal heart sounds. No murmur.  Pulmonary:     Effort: Tachypnea and accessory muscle usage present. No respiratory distress.     Breath sounds: Decreased breath sounds present. No wheezing.  Abdominal:     Palpations: Abdomen is soft.     Tenderness: There is no abdominal tenderness.  Musculoskeletal:     Right lower leg: Edema present.     Left lower leg: Edema present.  Skin:    General: Skin is warm and dry.     Capillary Refill: Capillary refill takes less than 2 seconds.  Neurological:     General: No focal deficit present.     Mental Status: She is alert.  ED Treatments / Results  Labs (all labs ordered are listed, but only abnormal results are displayed) Labs Reviewed  COMPREHENSIVE METABOLIC PANEL - Abnormal; Notable for  the following components:      Result Value   Sodium 133 (*)    Potassium 3.4 (*)    Chloride 93 (*)    Glucose, Bld 115 (*)    BUN 98 (*)    Creatinine, Ser 3.00 (*)    Total Protein 6.3 (*)    Total Bilirubin 1.9 (*)    GFR calc non Af Amer 15 (*)    GFR calc Af Amer 17 (*)    All other components within normal limits  CBC WITH DIFFERENTIAL/PLATELET - Abnormal; Notable for the following components:   RBC 2.40 (*)    Hemoglobin 7.2 (*)    HCT 23.6 (*)    RDW 17.2 (*)    Platelets 105 (*)    Lymphs Abs 0.6 (*)    All other components within normal limits  URINALYSIS, COMPLETE (UACMP) WITH MICROSCOPIC - Abnormal; Notable for the following components:   Leukocytes, UA TRACE (*)    All other components within normal limits  URINE CULTURE  BRAIN NATRIURETIC PEPTIDE  I-STAT TROPONIN, ED    EKG EKG Interpretation  Date/Time:  Sunday October 06 2018 12:51:43 EST Ventricular Rate:  95 PR Interval:    QRS Duration: 167 QT Interval:  411 QTC Calculation: 517 R Axis:   -69 Text Interpretation:  Ventricular-paced rhythm No further analysis attempted due to paced rhythm Confirmed by Lennice Sites [phone]) on 10/06/2018 12:55:04 PM   Radiology Dg Chest Portable 1 View  Result Date: 10/06/2018 CLINICAL DATA:  Shortness of breath with interscapular and epigastric pain. EXAM: PORTABLE CHEST 1 VIEW COMPARISON:  Radiographs 04/02/2017, 09/23/2018 and 09/24/2018. FINDINGS: 1300 hours. Central line has been removed. Left subclavian pacemaker lead appears unchanged overlapping the right ventricular apex. The heart is enlarged. There is aortic atherosclerosis with chronic vascular congestion. There is a chronic right pleural effusion and chronic right basilar airspace disease. Probable edema on the most recent study has resolved. No acute osseous findings. IMPRESSION: Resolved edema. Stable cardiomegaly, chronic right pleural effusion and right basilar pulmonary opacity. No suggested acute  findings. Electronically Signed   By: Richardean Sale M.D.   On: 10/06/2018 14:21    Procedures Procedures (including critical care time)  Medications Ordered in ED Medications - No data to display   Initial Impression / Assessment and Plan / ED Course  I have reviewed the triage vital signs and the nursing notes.  Pertinent labs & imaging results that were available during my care of the patient were reviewed by me and considered in my medical decision making (see chart for details).     KENNESHIA REHM is a 73 year old female with history of atrial fibrillation, heart failure, nonischemic cardiomyopathy who presents to the ED with shortness of breath, chest pain.  Patient with unremarkable vitals.  No fever.  Patient with recent admission for volume overload.  Has had increase in her torsemide over the past week.  Has been using metolazone as needed as well.  States that she has been keeping daily weights and has gone up about almost 5 to 10 pounds.  Patient states worsening symptoms when she is at rest and with exertion.  Increased swelling in her legs.  Patient has some JVD, rhonchi, peripheral edema.  Has had some chest pressure as well.  EKG overall unremarkable.  Unchanged from prior  EKGs.  Troponin within normal limits.  Chest x-ray shows overall improved congestion.  Patient has multiple electrolyte abnormalities with hypokalemia at 3.4, CKD with creatinine at 3, rising BUN.  Patient overall appears mildly volume overloaded.  However does have weight gain.  She is symptomatic.  She did have signs of respiratory distress upon arrival.  Hospitalist will admit for further diuresis.  May benefit from being on metolazone daily.  Given her medical complexity she would likely benefit from further IV diuresis inpatient while she can have her electrolytes monitored.  Admitted in stable condition.  This chart was dictated using voice recognition software.  Despite best efforts to proofread,   errors can occur which can change the documentation meaning.   Final Clinical Impressions(s) / ED Diagnoses   Final diagnoses:  Acute on chronic right-sided heart failure ([place])  Hypokalemia  Elevated BUN    ED Discharge Orders    None       Lennice Sites, DO 10/06/18 1442

## 2018-10-06 NOTE — ED Triage Notes (Signed)
Pt reports sob and chest pain as well as fluid retention, reports recent hospitalization.

## 2018-10-07 ENCOUNTER — Telehealth: Payer: Self-pay | Admitting: Gastroenterology

## 2018-10-07 ENCOUNTER — Observation Stay (HOSPITAL_BASED_OUTPATIENT_CLINIC_OR_DEPARTMENT_OTHER): Payer: Medicare Other

## 2018-10-07 DIAGNOSIS — D509 Iron deficiency anemia, unspecified: Secondary | ICD-10-CM | POA: Diagnosis not present

## 2018-10-07 DIAGNOSIS — I340 Nonrheumatic mitral (valve) insufficiency: Secondary | ICD-10-CM | POA: Diagnosis not present

## 2018-10-07 DIAGNOSIS — Z6841 Body Mass Index (BMI) 40.0 and over, adult: Secondary | ICD-10-CM | POA: Diagnosis not present

## 2018-10-07 DIAGNOSIS — D696 Thrombocytopenia, unspecified: Secondary | ICD-10-CM | POA: Diagnosis not present

## 2018-10-07 DIAGNOSIS — I361 Nonrheumatic tricuspid (valve) insufficiency: Secondary | ICD-10-CM | POA: Diagnosis not present

## 2018-10-07 DIAGNOSIS — R0602 Shortness of breath: Secondary | ICD-10-CM | POA: Diagnosis not present

## 2018-10-07 DIAGNOSIS — M109 Gout, unspecified: Secondary | ICD-10-CM | POA: Diagnosis present

## 2018-10-07 DIAGNOSIS — I5020 Unspecified systolic (congestive) heart failure: Secondary | ICD-10-CM | POA: Diagnosis not present

## 2018-10-07 DIAGNOSIS — I495 Sick sinus syndrome: Secondary | ICD-10-CM | POA: Diagnosis present

## 2018-10-07 DIAGNOSIS — M1990 Unspecified osteoarthritis, unspecified site: Secondary | ICD-10-CM | POA: Diagnosis present

## 2018-10-07 DIAGNOSIS — E876 Hypokalemia: Secondary | ICD-10-CM | POA: Diagnosis not present

## 2018-10-07 DIAGNOSIS — Z8249 Family history of ischemic heart disease and other diseases of the circulatory system: Secondary | ICD-10-CM | POA: Diagnosis not present

## 2018-10-07 DIAGNOSIS — I428 Other cardiomyopathies: Secondary | ICD-10-CM | POA: Diagnosis present

## 2018-10-07 DIAGNOSIS — I130 Hypertensive heart and chronic kidney disease with heart failure and stage 1 through stage 4 chronic kidney disease, or unspecified chronic kidney disease: Secondary | ICD-10-CM | POA: Diagnosis present

## 2018-10-07 DIAGNOSIS — Z833 Family history of diabetes mellitus: Secondary | ICD-10-CM | POA: Diagnosis not present

## 2018-10-07 DIAGNOSIS — R195 Other fecal abnormalities: Secondary | ICD-10-CM | POA: Diagnosis not present

## 2018-10-07 DIAGNOSIS — R1310 Dysphagia, unspecified: Secondary | ICD-10-CM | POA: Diagnosis present

## 2018-10-07 DIAGNOSIS — Z950 Presence of cardiac pacemaker: Secondary | ICD-10-CM | POA: Diagnosis not present

## 2018-10-07 DIAGNOSIS — E871 Hypo-osmolality and hyponatremia: Secondary | ICD-10-CM | POA: Diagnosis present

## 2018-10-07 DIAGNOSIS — I5043 Acute on chronic combined systolic (congestive) and diastolic (congestive) heart failure: Secondary | ICD-10-CM

## 2018-10-07 DIAGNOSIS — Z823 Family history of stroke: Secondary | ICD-10-CM | POA: Diagnosis not present

## 2018-10-07 DIAGNOSIS — J9811 Atelectasis: Secondary | ICD-10-CM | POA: Diagnosis not present

## 2018-10-07 DIAGNOSIS — G4733 Obstructive sleep apnea (adult) (pediatric): Secondary | ICD-10-CM | POA: Diagnosis present

## 2018-10-07 DIAGNOSIS — D649 Anemia, unspecified: Secondary | ICD-10-CM | POA: Diagnosis not present

## 2018-10-07 DIAGNOSIS — K219 Gastro-esophageal reflux disease without esophagitis: Secondary | ICD-10-CM | POA: Diagnosis present

## 2018-10-07 DIAGNOSIS — N179 Acute kidney failure, unspecified: Secondary | ICD-10-CM | POA: Diagnosis present

## 2018-10-07 DIAGNOSIS — Z66 Do not resuscitate: Secondary | ICD-10-CM | POA: Diagnosis not present

## 2018-10-07 DIAGNOSIS — N184 Chronic kidney disease, stage 4 (severe): Secondary | ICD-10-CM | POA: Diagnosis present

## 2018-10-07 DIAGNOSIS — I4821 Permanent atrial fibrillation: Secondary | ICD-10-CM | POA: Diagnosis present

## 2018-10-07 DIAGNOSIS — Z7982 Long term (current) use of aspirin: Secondary | ICD-10-CM | POA: Diagnosis not present

## 2018-10-07 DIAGNOSIS — D631 Anemia in chronic kidney disease: Secondary | ICD-10-CM | POA: Diagnosis not present

## 2018-10-07 DIAGNOSIS — Z90710 Acquired absence of both cervix and uterus: Secondary | ICD-10-CM | POA: Diagnosis not present

## 2018-10-07 DIAGNOSIS — Z8541 Personal history of malignant neoplasm of cervix uteri: Secondary | ICD-10-CM | POA: Diagnosis not present

## 2018-10-07 DIAGNOSIS — J90 Pleural effusion, not elsewhere classified: Secondary | ICD-10-CM | POA: Diagnosis not present

## 2018-10-07 DIAGNOSIS — Z7189 Other specified counseling: Secondary | ICD-10-CM | POA: Diagnosis not present

## 2018-10-07 DIAGNOSIS — Z515 Encounter for palliative care: Secondary | ICD-10-CM | POA: Diagnosis not present

## 2018-10-07 DIAGNOSIS — I471 Supraventricular tachycardia: Secondary | ICD-10-CM | POA: Diagnosis not present

## 2018-10-07 DIAGNOSIS — Z87891 Personal history of nicotine dependence: Secondary | ICD-10-CM | POA: Diagnosis not present

## 2018-10-07 LAB — BASIC METABOLIC PANEL
Anion gap: 11 (ref 5–15)
Anion gap: 13 (ref 5–15)
BUN: 97 mg/dL — ABNORMAL HIGH (ref 8–23)
BUN: 98 mg/dL — ABNORMAL HIGH (ref 8–23)
CO2: 31 mmol/L (ref 22–32)
CO2: 29 mmol/L (ref 22–32)
Calcium: 8.8 mg/dL — ABNORMAL LOW (ref 8.9–10.3)
Calcium: 9.2 mg/dL (ref 8.9–10.3)
Chloride: 92 mmol/L — ABNORMAL LOW (ref 98–111)
Chloride: 90 mmol/L — ABNORMAL LOW (ref 98–111)
Creatinine, Ser: 2.94 mg/dL — ABNORMAL HIGH (ref 0.44–1.00)
Creatinine, Ser: 3.06 mg/dL — ABNORMAL HIGH (ref 0.44–1.00)
GFR calc Af Amer: 18 mL/min — ABNORMAL LOW (ref >60)
GFR calc Af Amer: 17 mL/min — ABNORMAL LOW (ref >60)
GFR calc non Af Amer: 15 mL/min — ABNORMAL LOW (ref >60)
GFR calc non Af Amer: 14 mL/min — ABNORMAL LOW (ref >60)
Glucose, Bld: 97 mg/dL (ref 70–99)
Glucose, Bld: 169 mg/dL — ABNORMAL HIGH (ref 70–99)
POTASSIUM: 3.2 mmol/L — AB (ref 3.5–5.1)
Potassium: 2.9 mmol/L — ABNORMAL LOW (ref 3.5–5.1)
Sodium: 134 mmol/L — ABNORMAL LOW (ref 135–145)
Sodium: 132 mmol/L — ABNORMAL LOW (ref 135–145)

## 2018-10-07 LAB — BPAM RBC
BLOOD PRODUCT EXPIRATION DATE: 202001172359
Blood Product Expiration Date: 202001172359
Blood Product Expiration Date: 202001282359
Blood Product Expiration Date: 202001292359
ISSUE DATE / TIME: 201912310613
ISSUE DATE / TIME: 201912311202
ISSUE DATE / TIME: 201912311721
ISSUE DATE / TIME: 201912312357
UNIT TYPE AND RH: 5100
Unit Type and Rh: 5100
Unit Type and Rh: 5100
Unit Type and Rh: 5100

## 2018-10-07 LAB — URINE CULTURE: Culture: <10000 — AB

## 2018-10-07 LAB — TYPE AND SCREEN
ABO/RH(D): O POS
ANTIBODY SCREEN: NEGATIVE
UNIT DIVISION: 0
Unit division: 0
Unit division: 0
Unit division: 0

## 2018-10-07 LAB — CBC
HCT: 21.6 % — ABNORMAL LOW (ref 36.0–46.0)
Hemoglobin: 6.7 g/dL — CL (ref 12.0–15.0)
MCH: 30.5 pg (ref 26.0–34.0)
MCHC: 31 g/dL (ref 30.0–36.0)
MCV: 98.2 fL (ref 80.0–100.0)
Platelets: 97 10*3/uL — ABNORMAL LOW (ref 150–400)
RBC: 2.2 MIL/uL — ABNORMAL LOW (ref 3.87–5.11)
RDW: 18.6 % — ABNORMAL HIGH (ref 11.5–15.5)
WBC: 4.8 10*3/uL (ref 4.0–10.5)
nRBC: 0 % (ref 0.0–0.2)

## 2018-10-07 LAB — ECHOCARDIOGRAM COMPLETE
Height: 66 in
Weight: 4102.4 oz

## 2018-10-07 LAB — PREPARE RBC (CROSSMATCH)

## 2018-10-07 MED ORDER — POTASSIUM CHLORIDE CRYS ER 20 MEQ PO TBCR
40.0000 meq | EXTENDED_RELEASE_TABLET | Freq: Once | ORAL | Status: AC
  Administered 2018-10-07: 40 meq via ORAL
  Filled 2018-10-07: qty 2

## 2018-10-07 MED ORDER — SODIUM CHLORIDE 0.9% IV SOLUTION: Freq: Once | INTRAVENOUS | Status: DC

## 2018-10-07 MED ORDER — POTASSIUM CHLORIDE CRYS ER 20 MEQ PO TBCR: 20.0000 meq | EXTENDED_RELEASE_TABLET | Freq: Two times a day (BID) | ORAL | Status: DC

## 2018-10-07 MED ORDER — POTASSIUM CHLORIDE CRYS ER 20 MEQ PO TBCR
40.0000 meq | EXTENDED_RELEASE_TABLET | ORAL | Status: AC
  Administered 2018-10-07 (×2): 40 meq via ORAL
  Filled 2018-10-07 (×2): qty 2

## 2018-10-07 NOTE — Progress Notes (Signed)
CC'D TO PCP °

## 2018-10-07 NOTE — Telephone Encounter (Signed)
Noted. Appears her Hgb is down more and they plan to transfuse. I will her follow her hospitalization and make further recommendations regarding follow up with [place] etc after she is D/C.

## 2018-10-07 NOTE — Progress Notes (Signed)
Upon arrival to room to check for CPAP, pt already on home cpap and asleep.

## 2018-10-07 NOTE — Consult Note (Addendum)
Advanced Heart Failure Team Consult Note   Primary Physician: [name], MD PCP-Cardiologist:  Dorris Carnes, MD  HF: Dr Haroldine Laws  Reason for Consultation: A/C combined HF  HPI:    [name] [name] is seen today for evaluation of A/C combined HF at the request of Dr Clementeen Graham.   [name] [name] a 73 y.o.femalewith a history of morbid obesity, afib s/p AV ablation andSt JudePPM, chronic combined HF, CKD IV, severe MR, TR, pulmonary HTN, OSA on BiPAP, GERD, gout, and iron deficiency anemia. Cardiac cath 2009 with normal coronary arteries.   Admitted 06/20/18 with CP and SOB.Echo was completed and showed EF 40-45% with severe MR/TR. Cause of cardiomyopathy uncertain. Could be valvular, could be due to chronic RV pacing . Underwent RHC and TEE (see below). Diuresed with IV lasix and transistioned to torsemide 80 mg twice a day. Discharge weight 244.6 pounds.   She saw Dr Burt Knack on 10/16 for possible mitral clip. There was concern mitral clip may not make a difference due to multiple co-morbidities including RV failure with severe TR.   She was last seen in HF clinic on 09/16/18. She was doing okay. Volume looked good. Planned to repeat echo at next visit. Weight was 240 lbs.  She was admitted 09/23/18-09/29/18 with hyperkalemia (K 7.2) and hemoglobin 5.4. Received lokelma and valtassa. Received 4u PRBCs. GI consulted. She had an EGD that showed erosive gastritis. PPI increased. Refused colonoscopy. Complicated by AKI on CKD. Creatinine peaked at 4.4. Renal consulted. Renal US negative for hydronephrosis. Creatinine improved back to baseline 2.65 on day of DC. Potassium supp adjusted for DC. Torsemide held during admit, but resumed at DC. Metolazone was stopped. DC weight was 251 lbs.   She says that she was volume up when she was discharged and it got progressively worse. Urine was dark. +orthopnea. +BLE edema. +dry cough. Poor appetite. Limits fluid intake to >50 oz daily. No CP or  dizziness. Denies any bleeding. She took metolazone 2 days in a row with no improvement. She presented to [facility] on [facility]17 with worsening SOB. Weight up to 258 lbs.  Pertinent admission labs include: K 3.4, creatinine 3.0, hemoglobin 7.2, WBC 5.2, BNP 214 (previously 130-160), troponin 0.02. CXR: chronic right plearal effusion. Read as "resolved edema", but still looks like CHF to me.   She was given 1u PRBCs. Diuresis started with 80 mg IV lasix TID + 2.5 mg metolazone daily. Net negative 920 mls yesterday (received 2 doses before midnight). Down 2 lbs. K is 2.9 today.   She feels slightly less SOB. Still edematous and orthopneic. She would consider HD if she needed it.   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   Echo (06/21/2018): EF 40-45% Moderate TR, Severe MR   Review of Systems: [y] = yes, [ ]  = no   General: Weight gain [name] ]; Weight loss [ ] ; Anorexia [name] ]; Fatigue [ y]; Fever [ ] ; Chills [ ] ; Weakness [ ]   Cardiac: Chest pain/pressure [ ] ; Resting SOB [ ] ; Exertional SOB [name] ]; Orthopnea [ y]; Pedal Edema [ y]; Palpitations [ ] ; Syncope [ ] ; Presyncope [ ] ; Paroxysmal nocturnal dyspnea[ ]   Pulmonary: Cough [  y]; Wheezing[ ] ; Hemoptysis[ ] ; Sputum [ ] ; Snoring [ ]   GI: Vomiting[ ] ; Dysphagia[ ] ; Melena[ ] ; Hematochezia [ ] ; Heartburn[ ] ; Abdominal pain [ ] ; Constipation [ ] ; Diarrhea [ ] ; BRBPR [ ]   GU: Hematuria[ ] ; Dysuria [ ] ; Nocturia[ ]   Vascular: Pain in legs with walking [ ] ; Pain in feet with lying flat [ ] ; Non-healing sores [ ] ; Stroke [ ] ; TIA [ ] ; Slurred speech [ ] ;  Neuro: Headaches[ ] ; Vertigo[ ] ; Seizures[ ] ; Paresthesias[ ] ;Blurred vision [ ] ; Diplopia [ ] ; Vision changes [  ]  Ortho/Skin: Arthritis [name] ]; Joint pain [name] ]; Muscle pain [ ] ; Joint swelling [ ] ; Back Pain [ ] ; Rash [ ]   Psych: Depression[ ] ; Anxiety[ ]   Heme: Bleeding problems [ y]; Clotting disorders [ ] ; Anemia [name] ]  Endocrine: Diabetes [ ] ; Thyroid dysfunction[ ]   Home Medications Prior to Admission medications   Medication Sig Start Date End Date Taking? Authorizing Provider  allopurinol (ZYLOPRIM) 300 MG tablet Take 300 mg by mouth every evening.    Yes [provider]  aspirin 325 MG tablet Take 1 tablet (325 mg total) by mouth at bedtime. 09/30/18  Yes Cherene Altes, MD  calcitRIOL (ROCALTROL) 0.25 MCG capsule Take 0.25 mcg by mouth every other day.    Yes [provider]  COD LIVER OIL PO Take 1 capsule by mouth daily.    Yes [provider]  docusate sodium (COLACE) 100 MG capsule Take 100 mg by mouth daily as needed for mild constipation.    Yes [provider]  feeding supplement (BOOST HIGH PROTEIN) LIQD Take 1 Container by mouth 2 (two) times daily.    Yes [provider]  ferrous sulfate 325 (65 FE) MG tablet Take 325 mg by mouth at bedtime.    Yes [provider]  HYDROcodone-acetaminophen (NORCO) 10-325 MG per tablet Take 1 tablet by mouth See admin instructions. Take 1 tablet by mouth twice daily, may also take 1/2 tablet an additional 2 times during the day as needed for pain   Yes [provider]  isosorbide mononitrate (IMDUR) 30 MG 24 hr tablet Take 0.5 tablets (15 mg total) by mouth daily. Patient taking differently: Take 15 mg by mouth at bedtime.  12/10/17  Yes Fay Records, MD  metolazone (ZAROXOLYN) 2.5 MG tablet Take 2.5 mg by mouth as needed (fluid).    Yes [provider]  Multiple Vitamin (MULTIVITAMIN WITH MINERALS) TABS tablet Take 1 tablet by mouth at bedtime.   Yes [provider]  pantoprazole (PROTONIX) 40 MG tablet Take 1 tablet (40 mg total) by mouth 2 (two) times daily. 10/02/18  Yes  Mahala Menghini, PA-C  polyethylene glycol (MIRALAX / GLYCOLAX) packet Take 17 g by mouth at bedtime.   Yes [provider]  potassium chloride SA (K-DUR,KLOR-CON) 20 MEQ tablet Take 1 tablet (20 mEq total) by mouth 2 (two) times daily. Will need an additional 80 MEQ today 09/17/18 Patient taking differently: Take 20 mEq by mouth 2 (two) times daily.  09/17/18  Yes Bensimhon, Shaune Pascal, MD  torsemide (DEMADEX) 20 MG tablet Take 4 tablets (80 mg total) by mouth 2 (two) times daily. May also take 1 tablet (20 mg total) as needed (for weight greater than 246). 07/15/18  Yes Clegg, Amy D, NP  vitamin B-12 (CYANOCOBALAMIN) 500 MCG tablet Take 500 mcg by mouth daily.   Yes [provider]  PRESCRIPTION MEDICATION Inhale into the lungs  at bedtime. Bi-Pap    [provider]    Past Medical History: Past Medical History:  Diagnosis Date  . Anemia   . Atrial fibrillation ([place])   . Cataract   . Cervical cancer ([place]) 1991   s/p hysterectomy  . Chronic cellulitis   . Chronic combined systolic and diastolic congestive heart failure, NYHA class 2 (HCC)    LVEF 25-30% with restrictive diastolic filling  . Degenerative joint disease   . Diverticulitis   . Essential hypertension   . GERD (gastroesophageal reflux disease)   . Gout   . Kidney stones   . Morbid obesity ([place])   . NICM (nonischemic cardiomyopathy) ([place]) 02/22/2017  . Osteoarthritis   . Permanent atrial fibrillation 05/04/2009   Qualifier: History of  By: Quentin Cornwall CMA, Janett Billow    . 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   Qualifier: Diagnosis of  By: Lovena Le, MD, [facility], Binnie Kand   . Sinoatrial node dysfunction [facility])    Status post PPM - Dr. Lovena Le  . Sleep apnea    CPAP    Past Surgical History: Past Surgical History:  Procedure Laterality Date  . ABDOMINAL HYSTERECTOMY  1991  . BIOPSY  09/26/2018   Procedure: BIOPSY;  Surgeon: Lavena Bullion, DO;  Location: [place] ENDOSCOPY;  Service:  Gastroenterology;;  . COLONOSCOPY  1998   one polyp per patient  . ESOPHAGOGASTRODUODENOSCOPY (EGD) WITH PROPOFOL N/A 09/26/2018   Procedure: ESOPHAGOGASTRODUODENOSCOPY (EGD) WITH PROPOFOL;  Surgeon: Lavena Bullion, DO;  Location: [place];  Service: Gastroenterology;  Laterality: N/A;  . EYE SURGERY    . PACEMAKER INSERTION  Nov 2000   St Jude with revision in 2011  . PERCUTANEOUS NEPHROLITHOTOMY  April 2012  . RIGHT HEART CATH N/A 06/28/2018   Procedure: RIGHT HEART CATH;  Surgeon: Jolaine Artist, MD;  Location: [place] CV LAB;  Service: Cardiovascular;  Laterality: N/A;  . TEE WITHOUT CARDIOVERSION N/A 06/28/2018   Procedure: TRANSESOPHAGEAL ECHOCARDIOGRAM (TEE);  Surgeon: Jolaine Artist, MD;  Location: [facility] ENDOSCOPY;  Service: Cardiovascular;  Laterality: N/A;    Family History: Family History  Problem Relation Age of Onset  . Heart attack Father   . Stroke Mother   . Diabetes Sister   . Colon cancer Other        seven family members  . Liver disease Neg Hx   . Other Neg Hx     Social History: Social History   Socioeconomic History  . Marital status: Widowed    Spouse name: Not on file  . Number of children: 1  . Years of education: Not on file  . Highest education level: Some college, no degree  Occupational History  . Not on file  Social Needs  . Financial resource strain: Not hard at all  . Food insecurity:    Worry: Never true    Inability: Never true  . Transportation needs:    Medical: No    Non-medical: No  Tobacco Use  . Smoking status: Former Smoker    Packs/day: 1.00    Types: Cigarettes    Last attempt to quit: 09/26/1979    Years since quitting: 39.0  . Smokeless tobacco: Never Used  Substance and Sexual Activity  . Alcohol use: No  . Drug use: No  . Sexual activity: Never  Lifestyle  . Physical activity:    Days per week: Not on file    Minutes per session: Not on file  . Stress: Not on file  Relationships  . Social  connections:    Talks on phone: Not on file    Gets together: Not on file    Attends religious service: Not on file    Active member of club or organization: Not on file    Attends meetings of clubs or organizations: Not on file    Relationship status: Not on file  Other Topics Concern  . Not on file  Social History Narrative  . Not on file    Allergies:  Allergies  Allergen Reactions  . Cephalexin Shortness Of Breath  . Codeine Anaphylaxis    REACTION: throat swelling  . Contrast Media [Iodinated Diagnostic Agents] Other (See Comments)    Patient states "I have chronic kidney disease so the doctor said no dye in my veins."  . Peppermint Flavor Shortness Of Breath  . Prednisone Anaphylaxis, Shortness Of Breath and Swelling    REACTION: swelling, S.O.B.  . Tape Other (See Comments)    States plastic tape blisters her skin  . Ciprofloxacin Hives    Tolerating levofloxin   . Latex Itching and Rash  . Penicillins Hives    DID THE REACTION INVOLVE: Swelling of the face/tongue/throat, SOB, or low BP? No Sudden or severe rash/hives, skin peeling, or the inside of the mouth or nose? Yes Did it require medical treatment? Pt was in hospital at the time of reaction When did it last happen?age - mid 40's If all above answers are "NO", may proceed with cephalosporin use.  . Coreg [Carvedilol] Other (See Comments)    Beta Blockers cause her organs to shut down per patient  . Diamox [Acetazolamide] Rash    Rash after 2 doses of diamox     Objective:    Vital Signs:   Temp:  [97.8 F (36.6 C)-99.3 F (37.4 C)] 99.3 F (37.4 C) (01/13 0433) Pulse Rate:  [87-101] 93 (01/13 0433) Resp:  [12-21] 18 (01/13 0433) BP: (101-142)/(50-78) 128/52 (01/13 0433) SpO2:  [96 %-100 %] 99 % (01/13 0021) Weight:  [116.3 kg-117.3 kg] 116.3 kg (01/13 0425) Last BM Date: 10/05/18  Weight change: Filed Weights   10/06/18 1604 10/07/18 0425  Weight: 117.3 kg 116.3 kg     Intake/Output:   Intake/Output Summary (Last 24 hours) at 10/07/2018 0904 Last data filed at 10/07/2018 0831 Gross per 24 hour  Intake 780 ml  Output 2000 ml  Net -1220 ml      Physical Exam    General: Appears fatigued. SOB with talking.  HEENT: normal Neck: supple. JVP to ear. Carotids 2+ bilat; no bruits. No lymphadenopathy or thyromegaly appreciated. Cor: PMI nondisplaced. Regular rate & rhythm. No rubs, gallops or murmurs. Lungs: diminished in RLL.  Abdomen: soft, nontender, nondistended. No hepatosplenomegaly. No bruits or masses. Good bowel sounds. Extremities: no cyanosis, clubbing, rash, BLE 2+ edema, tender.  Neuro: alert & orientedx3, cranial nerves grossly intact. moves all 4 extremities w/o difficulty. Affect pleasant  Telemetry   Vpacing 90-100s. Personally reviewed.   EKG    Vpaced 95 bpm. Personally reviewed.   Labs   Basic Metabolic Panel: Recent Labs  Lab 10/03/18 0938 10/06/18 1309 10/07/18 0502  NA 136 133* 134*  K 3.6 3.4* 2.9*  CL 92* 93* 92*  CO2 26 29 31   GLUCOSE 85 115* 97  BUN 77* 98* 97*  CREATININE 2.90* 3.00* 2.94*  CALCIUM 9.4 9.5 8.8*    Liver Function Tests: Recent Labs  Lab 10/06/18 1309  AST 33  ALT 22  ALKPHOS 49  BILITOT 1.9*  PROT 6.3*  ALBUMIN 3.5   No results for input(s): LIPASE, AMYLASE in the last 168 hours. No results for input(s): AMMONIA in the last 168 hours.  CBC: Recent Labs  Lab 10/03/18 0938 10/06/18 1309  WBC 5.0 5.2  NEUTROABS 3.6 3.7  HGB 7.7* 7.2*  HCT 25.0* 23.6*  MCV 95 98.3  PLT 118* 105*    Cardiac Enzymes: No results for input(s): CKTOTAL, CKMB, CKMBINDEX, TROPONINI in the last 168 hours.  BNP: BNP (last 3 results) Recent Labs    06/20/18 2249 10/06/18 1309  BNP 160.6* 214.8*    ProBNP (last 3 results) No results for input(s): PROBNP in the last 8760 hours.   CBG: No results for input(s): GLUCAP in the last 168 hours.  Coagulation Studies: No results for  input(s): LABPROT, INR in the last 72 hours.   Imaging   Dg Chest Portable 1 View  Result Date: 10/06/2018 CLINICAL DATA:  Shortness of breath with interscapular and epigastric pain. EXAM: PORTABLE CHEST 1 VIEW COMPARISON:  Radiographs 04/02/2017, 09/23/2018 and 09/24/2018. FINDINGS: 1300 hours. Central line has been removed. Left subclavian pacemaker lead appears unchanged overlapping the right ventricular apex. The heart is enlarged. There is aortic atherosclerosis with chronic vascular congestion. There is a chronic right pleural effusion and chronic right basilar airspace disease. Probable edema on the most recent study has resolved. No acute osseous findings. IMPRESSION: Resolved edema. Stable cardiomegaly, chronic right pleural effusion and right basilar pulmonary opacity. No suggested acute findings. Electronically Signed   By: Richardean Sale M.D.   On: 10/06/2018 14:21      Medications:     Current Medications: . sodium chloride   Intravenous Once  . allopurinol  300 mg Oral QPM  . aspirin  325 mg Oral QHS  . calcitRIOL  0.25 mcg Oral QODAY  . feeding supplement  237 mL Oral TID WC  . ferrous sulfate  325 mg Oral QHS  . furosemide  80 mg Intravenous Q8H  . heparin  5,000 Units Subcutaneous Q8H  . HYDROcodone-acetaminophen  1 tablet Oral BID  . isosorbide mononitrate  15 mg Oral QHS  . metolazone  2.5 mg Oral Daily  . multivitamin with minerals  1 tablet Oral QHS  . pantoprazole  40 mg Oral BID  . polyethylene glycol  17 g Oral QHS  . [START ON 10/08/2018] potassium chloride  20 mEq Oral BID  . potassium chloride SA  40 mEq Oral Q4H  . ENSURE MAX PROTEIN  1 Container Oral BID  . vitamin B-12  500 mcg Oral Daily     Infusions:     Patient Profile   [name] [name] a 73 y.o.femalewith a history of morbid obesity, afib s/p AV ablation andSt JudePPM, chronic combined HF, CKD IV, severe MR, TR, pulmonary HTN, OSA on BiPAP, GERD, gout, and iron deficiency anemia.  Cardiac cath 2009 with normal coronary arteries.   Assessment/Plan   1. Acute on chronic combined HF: Echo 06/21/18 with EF 40-45%, LV severely dilated, mild LVH, akinesis of mid-apicalanteroseptal and apical myocardium, mild AS, severe MR, LA and RA severely dilated, moderate TR, PA peak pressure 42 mmHg. Cause of cardiomyopathy uncertain. Could be valvular, could be due to chronic RV pacing (has not had coronary angiography that I can see and now with CKD stage IV). [place] 10/4 showed preserved cardiac output with markedly elevated right and left heart filling pressures (very prominent RV failure). TEE looked like restrictive cardiomyopathy.  - Volume  status markedly overloaded. Weight up about 18 lbs. Complicated by CKD IV. Continue lasix 80 mg IV TID + 2.5 mg metolazone daily.  - She was taken off hydralazine due to soft BP. Continue imdur 15 mg daily  - No spiro/ARB/ACEI/ARNI with CKD stage IV.  - Refuses UNNA boots or TED hose.  - Repeat echo.  - Palliative consulted by primary team.  2. Mitral regurgitation: Severe MR on echo9/27/19 (mild on echo 10/2016). TEE on 10/4 showed moderate-severe central MR with some restriction of posterior leaflet. Tricuspid regurgitation was more impressive.  - She was evaluated by Dr Burt Knack on 10/16 for possible mitral clip. There was concern mitral clip may not make a difference due to multiple co-morbidities including RV failure with severe TR.  - continue medical therapy. As above, repeat echo.  3. Afib s/p AV nodal ablation and PPM: Chronic RV pacing in the 90-100s. - With dyssynchrony and decreased LV EF, could consider CRT upgrade at some point but doubt that will improve current situation - She refuses anticoagulation ("caused my teeth to fall out and my hair to fall out"). She will only take ASA.  4. Anemia/Recent GIB. - Hemoglobin 7.2 yesterday. Received 1u PRBC. Check CBC today.  - GI consulted last admission. She had an EGD that showed erosive  gastritis. PPI increased. Refused colonoscopy. May need to get GI involved again. Per primary.  5. OSA: Continue BiPAP qHS 6. CKD Stage 4: Baseline creatinine around 2.5. - Creatinine 2.94 today. Monitor closely. Follows with Nephrology outpatient. She would consider HD if it was offered to her.  7. Tricuspid regurgitation:  - Severe on TEE. Repeat echo.  8. Mild aortic stenosis - Repeat echo.  9. Hypokalemia - K 2.9 today. Supp. Recheck BMET this afternoon.  10. Right pleural effusion - Has had since October. May benefit symptomatically to get thoracentesis.   Medication concerns reviewed with patient and pharmacy team. Barriers identified: none at this time.   Length of Stay: [place], NP  10/07/2018, 9:04 AM  Advanced Heart Failure Team Pager [phone] (M-F; 7a - 4p)  Please contact [facility] for night-coverage after hours (4p -7a ) and weekends on [email or web address]  Patient seen with NP, agree with the above note.   Echo was done today and reviewed.  EF 30-35%, mild AS, moderate MR, moderate RV dilation, moderate TR.    She has recurrent volume overload and anemia.   On exam, JVP 16 cm with 3/6 systolic murmur, 1+ edema to knees.    She returns with marked volume overload complicated by cardiorenal syndrome.  Creatinine has been stable around 3.   - Needs aggressive diuresis.  Start with Lasix 80 mg IV every 8 hrs and a dose of metolazone, can transition to gtt if she does not diurese well.  - Volume management is complicated greatly by renal dysfunction. Follow closely.   Valvular heart disease not as impressive on today's echo (see above).   Ongoing anemia, ordered for transfusion.  Had EGD earlier this month with gastritis.  Would involve GI again as hgb back down to 6.7.    Her prognosis overall seems poor with his constellation of problems.   Loralie Champagne 10/07/2018 3:17 PM

## 2018-10-07 NOTE — Telephone Encounter (Signed)
ON RECALL  °

## 2018-10-07 NOTE — Plan of Care (Signed)

## 2018-10-07 NOTE — Progress Notes (Signed)
Pt refusing help while ambulating.

## 2018-10-07 NOTE — Telephone Encounter (Signed)
Forwarding FYI to Neil Crouch, PA who ordered the labs.

## 2018-10-07 NOTE — Progress Notes (Signed)
Blood Bank requested it be noted:   When the first order for a blood transfusion was placed 10/06/18, blood bank placed an order for blood due to antibodies found in patient's blood.  However, blood bank has not received any blood for her. As a result, this patient has not received blood during this admission yet even though two orders have been placed for blood to be transfused. Therefore, her Hgb drop from 7.2 (10/06/18) to 6.7 (10/07/18) was not following a transfusion. Blood bank expects to receive blood for this patient tomorrow, 10/08/18.

## 2018-10-07 NOTE — Progress Notes (Signed)
PROGRESS NOTE                                                                                                                                                                                                             Patient Demographics:    [name] [name], is a 73 y.o. female, DOB - 14-Dec-1945, BMW:[id number]  Admit date - 10/06/2018   Admitting Physician Lady Deutscher, MD  Outpatient Primary MD for the patient is [name], MD  LOS - 0  Outpatient Specialists: Dr. Haroldine Laws, Dr. Jimmy Footman  Chief Complaint  Patient presents with  . 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 fib, chronic kidney disease stage III-IV, liver cirrhosis secondary to NASH, status post [facility] pacemaker placement after AV node ablation, severe MR, TR, pulmonary hypertension, OSA on CPAP, GERD, iron deficiency anemia who was hospitalized 2 weeks back with hypokalemia and was told to stop her diuretic.  She then returned with anemia and hypokalemia and was transfused 4 unit PRBC during which time she also received fluids.  She was discharged back on torsemide but metolazone was held.  Patient was discharged 1 week prior to this hospitalization and reports she was supposed to see her PCP this week.  However she started having increasing leg swellings and shortness of breath with orthopnea.  Reported gaining >10 pounds. In the ED she was found to have hemoglobin of 7.2, acute on chronic kidney disease with clinical findings of fluid overload.  Weight was up about 18 pounds. Admitted for acute on chronic combined systolic and diastolic CHF and acute on chronic kidney disease with concern for cardiorenal syndrome.   Subjective:   Patient informed that her leg swellings have been slightly improved since admission.  Still has some orthopnea.   Assessment  & Plan :    Principal Problem: Acute on chronic combined systolic and diastolic CHF (HCC) Placed on IV Lasix 80 mg 3 times daily along with 2.5 mg metolazone daily. Continue Imdur.  Not on ACE inhibitor/ARB and Aldactone due to advanced chronic kidney disease.  Continue Imdur, was taken off hydralazine due to soft blood pressure.  Follow repeat 2D echo. Appreciate heart failure team consult.  Palliative care consulted as well for goals of care  discussion.  Active Problems: Acute on chronic kidney disease stage III-IV. Suspect cardiorenal syndrome.  Avoid nephrotoxic agents and monitor with aggressive diuresis. Follows with Dr. diuretic who can be consulted if needed.  Anemia with history of recent GI bleed Hemoglobin of 7.2 on presentation.  Received 1 unit PRBC.  Hemoglobin dropped to 6.7.  Will order another unit PRBC. EGD done last hospitalization showing erosive gastritis.  Continue PPI.  Patient refused colonoscopy.  Hypokalemia Continue daily replacement  A. fib with history of AV node ablation and PPM Has refused anticoagulation in the past.  Continue aspirin  Hyponatremia Likely hypervolemic.  Monitor closely.  Severe mitral regurgitation Was seen by Dr. Burt Knack previously for possible mitral clip.  Follow repeat echo  OSA Continue nighttime CPAP  Thrombocytopenia Chronic.  Monitor closely.    Code Status : Full code  Family Communication  : None at bedside  Disposition Plan  : Pending clinical improvement  Barriers For Discharge : Active symptoms  Consults  : Heart failure, palliative care  Procedures  : 2D echo  DVT Prophylaxis  : Subcu heparin  Lab Results  Component Value Date   PLT 97 (L) 10/07/2018    Antibiotics  :    Anti-infectives (From admission, onward)   None        Objective:   Vitals:   10/07/18 0021 10/07/18 0425 10/07/18 0433 10/07/18 1153  BP: (!) 127/50  (!) 128/52 108/72  Pulse: 91  93 (!) 102  Resp: 18  18 18    Temp: 98 F (36.7 C)  99.3 F (37.4 C)   TempSrc:   Oral   SpO2: 99%   100%  Weight:  116.3 kg    Height:        Wt Readings from Last 3 Encounters:  10/07/18 116.3 kg  10/02/18 115.7 kg  09/29/18 114.1 kg     Intake/Output Summary (Last 24 hours) at 10/07/2018 1159 Last data filed at 10/07/2018 0940 Gross per 24 hour  Intake 1020 ml  Output 2000 ml  Net -980 ml    Physical exam Morbidly obese female no acute distress HEENT: Pallor present, moist mucosa, supple neck Chest: Diminished bibasilar breath sound CVS: Normal S1-S2, no murmurs or gallop GI: Soft, nondistended, nontender, Musculoskeletal: 3+ pitting edema bilaterally      Data Review:    CBC Recent Labs  Lab 10/03/18 0938 10/06/18 1309 10/07/18 0959  WBC 5.0 5.2 4.8  HGB 7.7* 7.2* 6.7*  HCT 25.0* 23.6* 21.6*  PLT 118* 105* 97*  MCV 95 98.3 98.2  MCH 29.3 30.0 30.5  MCHC 30.8* 30.5 31.0  RDW 14.9 17.2* 18.6*  LYMPHSABS 0.7 0.6*  --   MONOABS  --  0.7  --   EOSABS 0.2 0.1  --   BASOSABS 0.0 0.0  --     Chemistries  Recent Labs  Lab 10/03/18 0938 10/06/18 1309 10/07/18 0502  NA 136 133* 134*  K 3.6 3.4* 2.9*  CL 92* 93* 92*  CO2 26 29 31   GLUCOSE 85 115* 97  BUN 77* 98* 97*  CREATININE 2.90* 3.00* 2.94*  CALCIUM 9.4 9.5 8.8*  AST  --  33  --   ALT  --  22  --   ALKPHOS  --  49  --   BILITOT  --  1.9*  --    ------------------------------------------------------------------------------------------------------------------ No results for input(s): CHOL, HDL, LDLCALC, TRIG, CHOLHDL, LDLDIRECT in the last 72 hours.  Lab Results  Component Value Date  HGBA1C 5.5 06/21/2018   ------------------------------------------------------------------------------------------------------------------ No results for input(s): TSH, T4TOTAL, T3FREE, THYROIDAB in the last 72 hours.  Invalid input(s):  FREET3 ------------------------------------------------------------------------------------------------------------------ No results for input(s): VITAMINB12, FOLATE, FERRITIN, TIBC, IRON, RETICCTPCT in the last 72 hours.  Coagulation profile No results for input(s): INR, PROTIME in the last 168 hours.  No results for input(s): DDIMER in the last 72 hours.  Cardiac Enzymes No results for input(s): CKMB, TROPONINI, MYOGLOBIN in the last 168 hours.  Invalid input(s): CK ------------------------------------------------------------------------------------------------------------------    Component Value Date/Time   BNP 214.8 (H) 10/06/2018 1309   BNP 122.0 (H) 04/13/2014 1405    Inpatient Medications  Scheduled Meds: . sodium chloride   Intravenous Once  . allopurinol  300 mg Oral QPM  . aspirin  325 mg Oral QHS  . calcitRIOL  0.25 mcg Oral QODAY  . feeding supplement  237 mL Oral TID WC  . ferrous sulfate  325 mg Oral QHS  . furosemide  80 mg Intravenous Q8H  . heparin  5,000 Units Subcutaneous Q8H  . HYDROcodone-acetaminophen  1 tablet Oral BID  . isosorbide mononitrate  15 mg Oral QHS  . metolazone  2.5 mg Oral Daily  . multivitamin with minerals  1 tablet Oral QHS  . pantoprazole  40 mg Oral BID  . polyethylene glycol  17 g Oral QHS  . [START ON 10/08/2018] potassium chloride  20 mEq Oral BID  . potassium chloride SA  40 mEq Oral Q4H  . ENSURE MAX PROTEIN  1 Container Oral BID  . vitamin B-12  500 mcg Oral Daily   Continuous Infusions: PRN Meds:.acetaminophen, diphenhydrAMINE, docusate sodium, ondansetron (ZOFRAN) IV, zolpidem  Micro Results Recent Results (from the past 240 hour(s))  Urine culture     Status: Abnormal   Collection Time: 10/06/18 12:56 PM  Result Value Ref Range Status   Specimen Description URINE, RANDOM  Final   Special Requests NONE  Final   Culture (A)  Final    <10,000 COLONIES/mL INSIGNIFICANT GROWTH Performed at [facility],  [address], [place], [place] [address]    Report Status 10/07/2018 FINAL  Final    Radiology Reports Dg Chest 1 View  Result Date: 09/23/2018 CLINICAL DATA:  Tachycardia EXAM: CHEST  1 VIEW COMPARISON:  06/25/2018 FINDINGS: Left pacer remains in place, unchanged. Cardiomegaly with vascular congestion. Perihilar and lower lobe opacities likely reflect edema, improved since prior study. Improving right effusion with small residual right effusion. IMPRESSION: Cardiomegaly with vascular congestion and mild pulmonary edema, improving since prior study. Small right effusion, and also improving since prior study. Electronically Signed   By: Rolm Baptise M.D.   On: 09/23/2018 20:00   US Renal  Result Date: 09/24/2018 CLINICAL DATA:  Acute onset of renal insufficiency. EXAM: RENAL / URINARY TRACT ULTRASOUND COMPLETE COMPARISON:  CT of the abdomen and pelvis from 04/05/2016 FINDINGS: Right Kidney: Renal measurements: 8.5 x 4.4 x 4.9 cm = volume: 93.1 mL. Increased renal parenchymal echogenicity is noted. No mass or hydronephrosis visualized. Left Kidney: Renal measurements: 7.7 x 4.5 x 6.3 cm = volume: 112.6 mL. Not well characterized due to overlying bowel gas and limitations in positioning. Increased renal parenchymal echogenicity is noted. No mass or hydronephrosis visualized. Bladder: Not visualized. IMPRESSION: Significantly limited study due to limitations in positioning and overlying bowel gas. No evidence of hydronephrosis. Increased renal parenchymal echogenicity may reflect medical renal disease. Electronically Signed   By: Garald Balding M.D.   On: 09/24/2018 01:53  Dg Chest Portable 1 View  Result Date: 10/06/2018 CLINICAL DATA:  Shortness of breath with interscapular and epigastric pain. EXAM: PORTABLE CHEST 1 VIEW COMPARISON:  Radiographs 04/02/2017, 09/23/2018 and 09/24/2018. FINDINGS: 1300 hours. Central line has been removed. Left subclavian pacemaker lead appears unchanged overlapping the  right ventricular apex. The heart is enlarged. There is aortic atherosclerosis with chronic vascular congestion. There is a chronic right pleural effusion and chronic right basilar airspace disease. Probable edema on the most recent study has resolved. No acute osseous findings. IMPRESSION: Resolved edema. Stable cardiomegaly, chronic right pleural effusion and right basilar pulmonary opacity. No suggested acute findings. Electronically Signed   By: Richardean Sale M.D.   On: 10/06/2018 14:21   Dg Chest Port 1 View  Result Date: 09/24/2018 CLINICAL DATA:  Encounter for central line placement. EXAM: PORTABLE CHEST 1 VIEW COMPARISON:  09/23/2018. FINDINGS: Massive cardiac enlargement. Single lead pacer unchanged. BILATERAL airspace opacities have considerably worsened since yesterday's radiograph with large RIGHT pleural effusion consistent with pulmonary edema. Central venous line placement from RIGHT IJ approach lies with its tip in the mid to distal SVC. No pneumothorax. IMPRESSION: 1. Satisfactory RIGHT IJ central venous line placement. 2. Worsening pulmonary edema. 3. No pneumothorax. Electronically Signed   By: Staci Righter M.D.   On: 09/24/2018 16:33    Time Spent in minutes  35   Clayten Allcock M.D on 10/07/2018 at 11:59 AM  Between 7am to 7pm - Pager - [phone]  After 7pm go to [email or web address] - password [facility]  [facility] -  Office  [phone]

## 2018-10-07 NOTE — Progress Notes (Signed)
  Echocardiogram 2D Echocardiogram has been performed.  Jannett Celestine 10/07/2018, 1:26 PM

## 2018-10-07 NOTE — Telephone Encounter (Signed)
Noted  

## 2018-10-07 NOTE — Consult Note (Signed)
   [facility] CM Inpatient Consult   10/07/2018  MARK BENECKE 1946-08-08 [id number]  Patient screened for extreme high risk for unplanned readmissions with a readmission within the past 7 days noted.  Patient with Medicare plan.  Chart review reveals per NP notes from Advanced Heart Failure clinic: Maila Dukes Sargentis a 73 y.o.femalewith a history of morbid obesity, afib s/p AV ablation andSt JudePPM, chronic combined HF, CKD IV, severe MR, TR, pulmonary HTN, OSA on BiPAP, GERD, gout, and iron deficiency anemia.Cardiac cath 2009 with normal coronary arteries. Will follow for progression and disposition needs.     Please place a [facility] Care Management consult or for questions contact:   Natividad Brood, RN BSN [facility] Liaison  [phone] business mobile phone Toll free office [phone]

## 2018-10-07 NOTE — Telephone Encounter (Signed)
Patient called and wanted [place] to know she was not going to be able to have her labs done today because she was admitted at [place]

## 2018-10-08 ENCOUNTER — Inpatient Hospital Stay (HOSPITAL_COMMUNITY): Payer: Medicare Other

## 2018-10-08 DIAGNOSIS — I130 Hypertensive heart and chronic kidney disease with heart failure and stage 1 through stage 4 chronic kidney disease, or unspecified chronic kidney disease: Principal | ICD-10-CM

## 2018-10-08 DIAGNOSIS — Z515 Encounter for palliative care: Secondary | ICD-10-CM

## 2018-10-08 DIAGNOSIS — Z7189 Other specified counseling: Secondary | ICD-10-CM

## 2018-10-08 LAB — BASIC METABOLIC PANEL
Anion gap: 11 (ref 5–15)
BUN: 101 mg/dL — ABNORMAL HIGH (ref 8–23)
CO2: 31 mmol/L (ref 22–32)
Calcium: 9 mg/dL (ref 8.9–10.3)
Chloride: 91 mmol/L — ABNORMAL LOW (ref 98–111)
Creatinine, Ser: 3.03 mg/dL — ABNORMAL HIGH (ref 0.44–1.00)
GFR calc Af Amer: 17 mL/min — ABNORMAL LOW (ref >60)
GFR calc non Af Amer: 15 mL/min — ABNORMAL LOW (ref >60)
Glucose, Bld: 109 mg/dL — ABNORMAL HIGH (ref 70–99)
Potassium: 3.2 mmol/L — ABNORMAL LOW (ref 3.5–5.1)
Sodium: 133 mmol/L — ABNORMAL LOW (ref 135–145)

## 2018-10-08 LAB — CBC
HCT: 20.1 % — ABNORMAL LOW (ref 36.0–46.0)
Hemoglobin: 6.1 g/dL — CL (ref 12.0–15.0)
MCH: 30.2 pg (ref 26.0–34.0)
MCHC: 30.3 g/dL (ref 30.0–36.0)
MCV: 99.5 fL (ref 80.0–100.0)
PLATELETS: 95 10*3/uL — AB (ref 150–400)
RBC: 2.02 MIL/uL — ABNORMAL LOW (ref 3.87–5.11)
RDW: 20.3 % — ABNORMAL HIGH (ref 11.5–15.5)
WBC: 4.9 10*3/uL (ref 4.0–10.5)
nRBC: 0.4 % — ABNORMAL HIGH (ref 0.0–0.2)

## 2018-10-08 MED ORDER — POTASSIUM CHLORIDE CRYS ER 20 MEQ PO TBCR
40.0000 meq | EXTENDED_RELEASE_TABLET | Freq: Three times a day (TID) | ORAL | Status: DC
  Administered 2018-10-08 – 2018-10-15 (×22): 40 meq via ORAL
  Filled 2018-10-08 (×3): qty 2
  Filled 2018-10-08: qty 4
  Filled 2018-10-08 (×16): qty 2
  Filled 2018-10-08: qty 4
  Filled 2018-10-08 (×2): qty 2

## 2018-10-08 MED ORDER — SODIUM CHLORIDE 0.9 % IV SOLN
INTRAVENOUS | Status: DC | PRN
  Administered 2018-10-08: 900 mL via INTRAVENOUS
  Administered 2018-10-09 – 2018-10-12 (×4): 1000 mL via INTRAVENOUS

## 2018-10-08 MED ORDER — POTASSIUM CHLORIDE CRYS ER 20 MEQ PO TBCR: 40.0000 meq | EXTENDED_RELEASE_TABLET | Freq: Two times a day (BID) | ORAL | Status: DC

## 2018-10-08 MED ORDER — BOOST PLUS PO LIQD
237.0000 mL | Freq: Three times a day (TID) | ORAL | Status: DC
  Administered 2018-10-08 – 2018-10-09 (×3): 237 mL via ORAL
  Filled 2018-10-08 (×7): qty 237

## 2018-10-08 MED ORDER — FUROSEMIDE 10 MG/ML IJ SOLN
120.0000 mg | Freq: Two times a day (BID) | INTRAVENOUS | Status: DC
  Administered 2018-10-08 – 2018-10-09 (×2): 120 mg via INTRAVENOUS
  Filled 2018-10-08: qty 10
  Filled 2018-10-08: qty 12
  Filled 2018-10-08: qty 10

## 2018-10-08 MED ORDER — SODIUM CHLORIDE 0.9 % IV SOLN: INTRAVENOUS | Status: DC

## 2018-10-08 NOTE — Progress Notes (Signed)
Pt states she cannot breath. Pt assessed by respiratory and RN. Lung sounds are clear in upper extremities and diminished in lower extremities. SpO2 is stating at 93% on RA. Pt has put BiPAP back on.

## 2018-10-08 NOTE — Progress Notes (Addendum)
PROGRESS NOTE                                                                                                                                                                                                             Patient Demographics:    [name] [name], is a 73 y.o. female, DOB - 1946-02-16, UYQ:[id number]  Admit date - 10/06/2018   Admitting Physician Lady Deutscher, MD  Outpatient Primary MD for the patient is [name], MD  LOS - 1  Outpatient Specialists: Dr. Haroldine Laws, Dr. Jimmy Footman  Chief Complaint  Patient presents with  . 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 fib, chronic kidney disease stage III-IV, liver cirrhosis secondary to NASH, status post [facility] pacemaker placement after AV node ablation, severe MR, TR, pulmonary hypertension, OSA on CPAP, GERD, iron deficiency anemia who was hospitalized 2 weeks back with hypokalemia and was told to stop her diuretic.  She then returned with anemia and hypokalemia and was transfused 4 unit PRBC during which time she also received fluids.  She was discharged back on torsemide but metolazone was held.  Patient was discharged 1 week prior to this hospitalization and reports she was supposed to see her PCP this week.  However she started having increasing leg swellings and shortness of breath with orthopnea.  Reported gaining >10 pounds. In the ED she was found to have hemoglobin of 7.2, acute on chronic kidney disease with clinical findings of fluid overload.  Weight was up about 18 pounds. Admitted for acute on chronic combined systolic and diastolic CHF and acute on chronic kidney disease with concern for cardiorenal syndrome.   Subjective:   Still feels short of breath and fatigued (slightly better from yesterday).     Assessment  & Plan :    Principal  Problem: Acute on chronic combined systolic and diastolic CHF (HCC) Placed on IV Lasix 80 mg 3 times daily along with 2.5 mg metolazone daily.  -2.3 L since admission. Continue Imdur.  Not on ACE inhibitor/ARB and Aldactone due to advanced chronic kidney disease.  Continue Imdur, was taken off hydralazine due to soft blood pressure.  Follow repeat 2D echo. Appreciate heart failure team consult.  Palliative care consulted as well for goals  of care discussion.  Active Problems: Acute on chronic kidney disease stage III-IV. Suspect cardiorenal syndrome.  Avoid nephrotoxic agents and monitor with aggressive diuresis. Follows with Dr. Jimmy Footman who can be consulted if needed.  Anemia with history of recent GI bleed Hemoglobin of 7.2 on presentation.  Hemoglobin dropped to 6.1 this morning.  Had not received PRBC due to several antibodies.  Now getting 2 units PRBC.  EGD done last hospitalization showing erosive gastritis.  Continue PPI.  Patient refused colonoscopy. GI consulted again.  Dr. Geoffry Paradise to see him.  Monitor H&H closely.  I was called blood bank that the previous transfusions she received had several antigens which likely caused more hemolysis and possibly caused her hemoglobin to drop further.  The current transfusions have been tested thoroughly for antigen and should not cause further hemolysis.  Right-sided pleural effusion Noted for past few months.  Will order ultrasound thoracentesis.  Hypokalemia Continue daily replacement  A. fib with history of AV node ablation and PPM Has refused anticoagulation in the past.  Holding aspirin for drop in H&H.  Hyponatremia Likely hypervolemic.  Monitor closely.  Severe mitral regurgitation Was seen by Dr. Burt Knack previously for possible mitral clip.  Follow repeat echo  OSA Continue nighttime CPAP  Thrombocytopenia Chronic.  Monitor closely.    Code Status : DNR.  Discussed at length high palliative care with patient and her  daughter.  She wants to treat the treatable but no extraordinary measures such as CPR, intubation or defibrillation.  Agree with the hospitalization as needed.  Family Communication  : None at bedside  Disposition Plan  : Pending clinical improvement  Barriers For Discharge : Active symptoms  Consults  : Heart failure, palliative care, GI (Dr. Benson Norway)  Procedures  : 2D echo  DVT Prophylaxis  : Subcu heparin  Lab Results  Component Value Date   PLT 95 (L) 10/08/2018    Antibiotics  :    Anti-infectives (From admission, onward)   None        Objective:   Vitals:   10/08/18 1134 10/08/18 1243 10/08/18 1244 10/08/18 1305  BP: (!) 107/57 (!) 121/57 (!) 121/57 108/65  Pulse: 89 98 98 97  Resp: 20 17 17 18   Temp: 98.2 F (36.8 C) 98.1 F (36.7 C) 98.1 F (36.7 C) 98.4 F (36.9 C)  TempSrc: Oral Oral Oral Oral  SpO2: 97% 95% 95% 97%  Weight:      Height:        Wt Readings from Last 3 Encounters:  10/08/18 116.3 kg  10/02/18 115.7 kg  09/29/18 114.1 kg     Intake/Output Summary (Last 24 hours) at 10/08/2018 1355 Last data filed at 10/08/2018 1245 Gross per 24 hour  Intake 1528 ml  Output 2700 ml  Net -1172 ml   Physical exam Morbidly obese female, appears fatigued HEENT: Pallor present, moist mucosa, supple neck, JVD + Chest: Diminished bibasilar breath sound CVs: Normal S1-S2, no murmurs GI: Soft, nondistended nontender Musculoskeletal: 3+ pitting edema bilaterally (mild improvement from yesterday)      Data Review:    CBC Recent Labs  Lab 10/03/18 0938 10/06/18 1309 10/07/18 0959 10/08/18 0425  WBC 5.0 5.2 4.8 4.9  HGB 7.7* 7.2* 6.7* 6.1*  HCT 25.0* 23.6* 21.6* 20.1*  PLT 118* 105* 97* 95*  MCV 95 98.3 98.2 99.5  MCH 29.3 30.0 30.5 30.2  MCHC 30.8* 30.5 31.0 30.3  RDW 14.9 17.2* 18.6* 20.3*  LYMPHSABS 0.7 0.6*  --   --  MONOABS  --  0.7  --   --   EOSABS 0.2 0.1  --   --   BASOSABS 0.0 0.0  --   --     Chemistries  Recent Labs  Lab  10/03/18 0938 10/06/18 1309 10/07/18 0502 10/07/18 1337 10/08/18 0425  NA 136 133* 134* 132* 133*  K 3.6 3.4* 2.9* 3.2* 3.2*  CL 92* 93* 92* 90* 91*  CO2 26 29 31 29 31   GLUCOSE 85 115* 97 169* 109*  BUN 77* 98* 97* 98* 101*  CREATININE 2.90* 3.00* 2.94* 3.06* 3.03*  CALCIUM 9.4 9.5 8.8* 9.2 9.0  AST  --  33  --   --   --   ALT  --  22  --   --   --   ALKPHOS  --  49  --   --   --   BILITOT  --  1.9*  --   --   --    ------------------------------------------------------------------------------------------------------------------ No results for input(s): CHOL, HDL, LDLCALC, TRIG, CHOLHDL, LDLDIRECT in the last 72 hours.  Lab Results  Component Value Date   HGBA1C 5.5 06/21/2018   ------------------------------------------------------------------------------------------------------------------ No results for input(s): TSH, T4TOTAL, T3FREE, THYROIDAB in the last 72 hours.  Invalid input(s): FREET3 ------------------------------------------------------------------------------------------------------------------ No results for input(s): VITAMINB12, FOLATE, FERRITIN, TIBC, IRON, RETICCTPCT in the last 72 hours.  Coagulation profile No results for input(s): INR, PROTIME in the last 168 hours.  No results for input(s): DDIMER in the last 72 hours.  Cardiac Enzymes No results for input(s): CKMB, TROPONINI, MYOGLOBIN in the last 168 hours.  Invalid input(s): CK ------------------------------------------------------------------------------------------------------------------    Component Value Date/Time   BNP 214.8 (H) 10/06/2018 1309   BNP 122.0 (H) 04/13/2014 1405    Inpatient Medications  Scheduled Meds: . sodium chloride   Intravenous Once  . sodium chloride   Intravenous Once  . sodium chloride   Intravenous Once  . allopurinol  300 mg Oral QPM  . calcitRIOL  0.25 mcg Oral QODAY  . feeding supplement  237 mL Oral TID WC  . ferrous sulfate  325 mg Oral QHS  .  HYDROcodone-acetaminophen  1 tablet Oral BID  . isosorbide mononitrate  15 mg Oral QHS  . lactose free nutrition  237 mL Oral TID WC  . metolazone  2.5 mg Oral Daily  . multivitamin with minerals  1 tablet Oral QHS  . pantoprazole  40 mg Oral BID  . polyethylene glycol  17 g Oral QHS  . potassium chloride  40 mEq Oral TID  . ENSURE MAX PROTEIN  1 Container Oral BID  . vitamin B-12  500 mcg Oral Daily   Continuous Infusions: . furosemide     PRN Meds:.acetaminophen, diphenhydrAMINE, docusate sodium, ondansetron (ZOFRAN) IV, zolpidem  Micro Results Recent Results (from the past 240 hour(s))  Urine culture     Status: Abnormal   Collection Time: 10/06/18 12:56 PM  Result Value Ref Range Status   Specimen Description URINE, RANDOM  Final   Special Requests NONE  Final   Culture (A)  Final    <10,000 COLONIES/mL INSIGNIFICANT GROWTH Performed at [facility], [address], [place], [place] [address]    Report Status 10/07/2018 FINAL  Final    Radiology Reports Dg Chest 1 View  Result Date: 09/23/2018 CLINICAL DATA:  Tachycardia EXAM: CHEST  1 VIEW COMPARISON:  06/25/2018 FINDINGS: Left pacer remains in place, unchanged. Cardiomegaly with vascular congestion. Perihilar and lower lobe opacities likely reflect  edema, improved since prior study. Improving right effusion with small residual right effusion. IMPRESSION: Cardiomegaly with vascular congestion and mild pulmonary edema, improving since prior study. Small right effusion, and also improving since prior study. Electronically Signed   By: Rolm Baptise M.D.   On: 09/23/2018 20:00   US Renal  Result Date: 09/24/2018 CLINICAL DATA:  Acute onset of renal insufficiency. EXAM: RENAL / URINARY TRACT ULTRASOUND COMPLETE COMPARISON:  CT of the abdomen and pelvis from 04/05/2016 FINDINGS: Right Kidney: Renal measurements: 8.5 x 4.4 x 4.9 cm = volume: 93.1 mL. Increased renal parenchymal echogenicity is noted. No mass or  hydronephrosis visualized. Left Kidney: Renal measurements: 7.7 x 4.5 x 6.3 cm = volume: 112.6 mL. Not well characterized due to overlying bowel gas and limitations in positioning. Increased renal parenchymal echogenicity is noted. No mass or hydronephrosis visualized. Bladder: Not visualized. IMPRESSION: Significantly limited study due to limitations in positioning and overlying bowel gas. No evidence of hydronephrosis. Increased renal parenchymal echogenicity may reflect medical renal disease. Electronically Signed   By: Garald Balding M.D.   On: 09/24/2018 01:53   Dg Chest Portable 1 View  Result Date: 10/06/2018 CLINICAL DATA:  Shortness of breath with interscapular and epigastric pain. EXAM: PORTABLE CHEST 1 VIEW COMPARISON:  Radiographs 04/02/2017, 09/23/2018 and 09/24/2018. FINDINGS: 1300 hours. Central line has been removed. Left subclavian pacemaker lead appears unchanged overlapping the right ventricular apex. The heart is enlarged. There is aortic atherosclerosis with chronic vascular congestion. There is a chronic right pleural effusion and chronic right basilar airspace disease. Probable edema on the most recent study has resolved. No acute osseous findings. IMPRESSION: Resolved edema. Stable cardiomegaly, chronic right pleural effusion and right basilar pulmonary opacity. No suggested acute findings. Electronically Signed   By: Richardean Sale M.D.   On: 10/06/2018 14:21   Dg Chest Port 1 View  Result Date: 09/24/2018 CLINICAL DATA:  Encounter for central line placement. EXAM: PORTABLE CHEST 1 VIEW COMPARISON:  09/23/2018. FINDINGS: Massive cardiac enlargement. Single lead pacer unchanged. BILATERAL airspace opacities have considerably worsened since yesterday's radiograph with large RIGHT pleural effusion consistent with pulmonary edema. Central venous line placement from RIGHT IJ approach lies with its tip in the mid to distal SVC. No pneumothorax. IMPRESSION: 1. Satisfactory RIGHT IJ  central venous line placement. 2. Worsening pulmonary edema. 3. No pneumothorax. Electronically Signed   By: Staci Righter M.D.   On: 09/24/2018 16:33    Time Spent in minutes  35   Ciria Bernardini M.D on 10/08/2018 at 1:55 PM  Between 7am to 7pm - Pager - [phone]  After 7pm go to [email or web address] - password [facility]  [facility] -  Office  [phone]

## 2018-10-08 NOTE — Progress Notes (Signed)
RN placed DNR bracelet on pt per order.

## 2018-10-08 NOTE — Consult Note (Signed)
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  The patient was recently hospitalized at the turn of the new year.  She presented with an HGB of 5.4 g/dL and an EGD was performed by Dr. Bryan Lemma without any overt findings of a bleeding source, but she was noted to have some small antral erosions.  Since her last admission to this present admission the patient denies seeing any hematochezia or melena.  The patient also denies any issues with hematemesis or hematuria.   Past Medical History:  Diagnosis Date  . Anemia   . Atrial fibrillation ([place])   . Cataract   . Cervical cancer ([place]) 1991   s/p hysterectomy  . Chronic cellulitis   . Chronic combined systolic and diastolic congestive heart failure, NYHA class 2 (HCC)    LVEF 25-30% with restrictive diastolic filling  . Degenerative joint disease   . Diverticulitis   . Essential hypertension   . GERD (gastroesophageal reflux disease)   . Gout   . Kidney stones   . Morbid obesity ([place])   . NICM (nonischemic cardiomyopathy) ([place]) 02/22/2017  . Osteoarthritis   . Permanent atrial fibrillation 05/04/2009   Qualifier: History of  By: Quentin Cornwall CMA, Janett Billow    . 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   Qualifier: Diagnosis of  By: Lovena Le, MD, [facility], Binnie Kand   . Sinoatrial node dysfunction [facility])    Status post PPM - Dr. Lovena Le  . Sleep apnea    CPAP    Past Surgical History:  Procedure Laterality Date  . ABDOMINAL HYSTERECTOMY  1991  . BIOPSY  09/26/2018   Procedure: BIOPSY;  Surgeon: Lavena Bullion, DO;  Location: [place] [facility];  Service: Gastroenterology;;   . COLONOSCOPY  1998   one polyp per patient  . ESOPHAGOGASTRODUODENOSCOPY (EGD) WITH PROPOFOL N/A 09/26/2018   Procedure: ESOPHAGOGASTRODUODENOSCOPY (EGD) WITH PROPOFOL;  Surgeon: Lavena Bullion, DO;  Location: [place];  Service: Gastroenterology;  Laterality: N/A;  . EYE SURGERY    . PACEMAKER INSERTION  Nov 2000   St Jude with revision in 2011  . PERCUTANEOUS NEPHROLITHOTOMY  April 2012  . RIGHT HEART CATH N/A 06/28/2018   Procedure: RIGHT HEART CATH;  Surgeon: Jolaine Artist, MD;  Location: [place] [facility];  Service: Cardiovascular;  Laterality: N/A;  . TEE WITHOUT CARDIOVERSION N/A 06/28/2018   Procedure: TRANSESOPHAGEAL ECHOCARDIOGRAM (TEE);  Surgeon: Jolaine Artist, MD;  Location: [facility] END[facility];  Service: Cardiovascular;  Laterality: N/A;    Family History  Problem Relation Age of Onset  . Heart attack Father   . Stroke Mother   . Diabetes Sister   . Colon cancer Other        seven family members  . Liver disease Neg Hx   . Other Neg Hx     Social History:  reports that she quit smoking about 39 years ago. Her smoking use included cigarettes. She smoked 1.00 pack per day. She has never used smokeless tobacco. She reports that she does not drink alcohol or use drugs.  Allergies:  Allergies  Allergen Reactions  . Cephalexin Shortness Of  Breath  . Codeine Anaphylaxis    REACTION: throat swelling  . Contrast Media [Iodinated Diagnostic Agents] Other (See Comments)    Patient states "I have chronic kidney disease so the doctor said no dye in my veins."  . Peppermint Flavor Shortness Of Breath  . Prednisone Anaphylaxis, Shortness Of Breath and Swelling    REACTION: swelling, S.O.B.  . Tape Other (See Comments)    States plastic tape blisters her skin  . Ciprofloxacin Hives    Tolerating levofloxin   . Latex Itching and Rash  . Penicillins Hives    DID THE REACTION INVOLVE: Swelling of the face/tongue/throat, SOB, or low BP? No Sudden or severe  rash/hives, skin peeling, or the inside of the mouth or nose? Yes Did it require medical treatment? Pt was in hospital at the time of reaction When did it last happen?age - mid 40's If all above answers are "NO", may proceed with cephalosporin use.  . Coreg [Carvedilol] Other (See Comments)    Beta Blockers cause her organs to shut down per patient  . Diamox [Acetazolamide] Rash    Rash after 2 doses of diamox     Medications:  Scheduled: . sodium chloride   Intravenous Once  . sodium chloride   Intravenous Once  . sodium chloride   Intravenous Once  . allopurinol  300 mg Oral QPM  . calcitRIOL  0.25 mcg Oral QODAY  . feeding supplement  237 mL Oral TID WC  . ferrous sulfate  325 mg Oral QHS  . HYDROcodone-acetaminophen  1 tablet Oral BID  . isosorbide mononitrate  15 mg Oral QHS  . lactose free nutrition  237 mL Oral TID WC  . metolazone  2.5 mg Oral Daily  . multivitamin with minerals  1 tablet Oral QHS  . pantoprazole  40 mg Oral BID  . polyethylene glycol  17 g Oral QHS  . potassium chloride  40 mEq Oral TID  . ENSURE MAX PROTEIN  1 Container Oral BID  . vitamin B-12  500 mcg Oral Daily   Continuous: . furosemide      Results for orders placed or performed during the hospital encounter of 10/06/18 (from the past 24 hour(s))  Basic metabolic panel     Status: Abnormal   Collection Time: 10/07/18  1:37 PM  Result Value Ref Range   Sodium 132 (L) 135 - 145 mmol/L   Potassium 3.2 (L) 3.5 - 5.1 mmol/L   Chloride 90 (L) 98 - 111 mmol/L   CO2 29 22 - 32 mmol/L   Glucose, Bld 169 (H) 70 - 99 mg/dL   BUN 98 (H) 8 - 23 mg/dL   Creatinine, Ser 3.06 (H) 0.44 - 1.00 mg/dL   Calcium 9.2 8.9 - 10.3 mg/dL   GFR calc non Af Amer 14 (L) >60 mL/min   GFR calc Af Amer 17 (L) >60 mL/min   Anion gap 13 5 - 15  Basic metabolic panel     Status: Abnormal   Collection Time: 10/08/18  4:25 AM  Result Value Ref Range   Sodium 133 (L) 135 - 145 mmol/L   Potassium 3.2 (L) 3.5 - 5.1  mmol/L   Chloride 91 (L) 98 - 111 mmol/L   CO2 31 22 - 32 mmol/L   Glucose, Bld 109 (H) 70 - 99 mg/dL   BUN 101 (H) 8 - 23 mg/dL   Creatinine, Ser 3.03 (H) 0.44 - 1.00 mg/dL   Calcium 9.0 8.9 - 10.3 mg/dL  GFR calc non Af Amer 15 (L) >60 mL/min   GFR calc Af Amer 17 (L) >60 mL/min   Anion gap 11 5 - 15  CBC     Status: Abnormal   Collection Time: 10/08/18  4:25 AM  Result Value Ref Range   WBC 4.9 4.0 - 10.5 K/uL   RBC 2.02 (L) 3.87 - 5.11 MIL/uL   Hemoglobin 6.1 (LL) 12.0 - 15.0 g/dL   HCT 20.1 (L) 36.0 - 46.0 %   MCV 99.5 80.0 - 100.0 fL   MCH 30.2 26.0 - 34.0 pg   MCHC 30.3 30.0 - 36.0 g/dL   RDW 20.3 (H) 11.5 - 15.5 %   Platelets 95 (L) 150 - 400 K/uL   nRBC 0.4 (H) 0.0 - 0.2 %     Dg Chest Portable 1 View  Result Date: 10/06/2018 CLINICAL DATA:  Shortness of breath with interscapular and epigastric pain. EXAM: PORTABLE CHEST 1 VIEW COMPARISON:  Radiographs 04/02/2017, 09/23/2018 and 09/24/2018. FINDINGS: 1300 hours. Central line has been removed. Left subclavian pacemaker lead appears unchanged overlapping the right ventricular apex. The heart is enlarged. There is aortic atherosclerosis with chronic vascular congestion. There is a chronic right pleural effusion and chronic right basilar airspace disease. Probable edema on the most recent study has resolved. No acute osseous findings. IMPRESSION: Resolved edema. Stable cardiomegaly, chronic right pleural effusion and right basilar pulmonary opacity. No suggested acute findings. Electronically Signed   By: Richardean Sale M.D.   On: 10/06/2018 14:21    ROS:  As stated above in the HPI otherwise negative.  Blood pressure (!) 107/57, pulse 89, temperature 98.2 F (36.8 C), temperature source Oral, resp. rate 20, height 5' 6"  (1.676 m), weight 116.3 kg, SpO2 97 %.    PE: Gen: NAD, Alert and Oriented HEENT:  [place]/AT, EOMI Neck: Supple, no LAD Lungs: CTA Bilaterally CV: RRR without M/G/R ABM: Soft, ventral hernia, some mild  tenderness, +BS, morbidly obese. Ext: No C/C/E  Assessment/Plan: 1) Anemia. 2) CHF. 3) OSA. 4) Pulmonary HTN.   The patient has a worsening of her anemia.  The source of bleeding is not clear as there are no reports of overt bleeding.  The recent EGD did show some issues with superficial erosions, but those findings cannot explain the severe anemia.  Her clinical status is poor.  Even though she tolerated the recent EGD, she is still high risk for a repeat EGD or a colonoscopy.  She reports having a colonoscopy 9 years ago.  I think the best compromise with her health and an evaluation of her upper GI tract as well as the small bowel is a VCE.  She is followed by Marylee Floras and she can undergo further work up upon discharge, provided that there is no overt source of bleeding with the VCE.  Plan: 1) VCE tomorrow.  Keaun Schnabel D 10/08/2018, 12:42 PM

## 2018-10-08 NOTE — Consult Note (Signed)
Consultation Note Date: 10/08/2018   Patient Name: [name] [name]  DOB: 11-26-1945  MRN: [id number]  Age / Sex: 73 y.o., female  PCP: [name], MD Referring Physician: Louellen Molder, MD  Reason for Consultation: Establishing goals of care and Psychosocial/spiritual support  HPI/Patient Profile: 73 y.o. female  with past medical history of A. fib, chronic combined systolic and diastolic heart failure with an EF of 25 to 30%, high blood pressure, GERD, diverticulitis, degenerative joint disease, morbid obesity, sleep apnea, permanent A. fib with pacemaker, history of chronic cellulitis, history of cervical cancer with hysterectomy in 1991 admitted on 10/06/2018 with acute on chronic heart failure.   Clinical Assessment and Goals of Care: I have reviewed medical records including EPIC notes, labs and imaging, received report from nursing staff, assessed the patient and then met at the bedside along with daughter Marcie Bal to discuss diagnosis prognosis, [place], EOL wishes, disposition and options.  [name] is sitting up at the edge of the bed in her room.  She greets me making and keeping eye contact.  She is calm and cooperative, pleasant.  Present today at bedside is daughter Marcie Bal.  Daughter Sherri who lives in her home participates via cell phone.  I introduced Palliative Medicine as specialized medical care for people living with serious illness. It focuses on providing relief from the symptoms and stress of a serious illness. The goal is to improve quality of life for both the patient and the family.  I shared that there is no agenda during our meeting, just a meeting to support [name] and her family.  We discussed a brief life review of the patient.  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  She currently lives in her daughter Janet's home with daughter Judeen Hammans and granddaughter Autumn.   She shares that she has an efficiency apartment in the basement, lots of help from friends and family.  She talks about going to a local pond to fish, being active with her granddaughter.  As far as functional and nutritional status she has an apartment in her daughter's home with all equipment for safety.  Marcie Bal does grocery shopping and they are very mindful of sodium restrictions and fluid restrictions.  Both [name] and Marcie Bal seem knowledgeable about [name]'s chronic health problems.  Daughter Venida Jarvis is also cardiology nurse practitioner in [place].  We discussed her current illness and what it means in the larger context of her on-going co-morbidities.  We talked about the heart needs to stay dry but the kidneys need to stay wet.  We talked about how the heart works including electrical, mechanical, valvular, [name] shares that she has all these problems with her heart.  The natural disease trajectory and expectations at EOL were discussed.  I share a diagram of the chronic illness pathway, what is normal and expected.  I attempted to elicit values and goals of care important to the patient.  We talked about physical therapy evaluation and rehab.  [name] shares that  she prefers to not be in a nursing home, Marcie Bal quickly endorses that she would not allow her mother to live in a nursing home.  [name] endorses that she has chronic and progressive health problems, she endorses that she will get sick again, her problems will get worse.  Advanced directives, concepts specific to code status, and rehospitalization were considered and discussed.  [name] endorses to treat the treatable, but allow a natural death.  No CPR, no defibrillation, no intubation.  Daughter Marcie Bal states that she can endorse this choice.  Hospice and Palliative Care services outpatient were not discussed today as Marcie Bal is somewhat tearful stating that the emergency room physician immediately ask them  about having hospice services.  If [name] is still hospitalized on Friday, I will return and speak with her about the benefits of at home hospice service.  Questions and concerns were addressed.  The family was encouraged to call with questions or concerns.   HCPOA   HCPOA -[name] and Marcie Bal share that Marcie Bal has both healthcare and durable power of attorney paperwork.   SUMMARY OF RECOMMENDATIONS   Continue to treat the treatable, but no extraordinary measures such as CPR, defibrillation or intubation. Continue to rehospitalize as needed at this point.  Code Status/Advance Care Planning:  DNR -[name] endorses to allow a natural death.  Daughter Marcie Bal says she can abide this decision.  Symptom Management:   Per hospitalist, no additional needs at this time.  Palliative Prophylaxis:   Palliative Wound Care  Additional Recommendations (Limitations, Scope, Preferences):  No CPR, no intubation, no defibrillation  Psycho-social/Spiritual:   Desire for further Chaplaincy support:no  Additional Recommendations: Caregiving  Support/Resources and Education on Hospice  Prognosis:   < 6 months, or less would not be surprising based on advancing chronic heart and kidney disease.  Discharge Planning: To be determined, anticipate home with home health services if needed.      Primary Diagnoses: Present on Admission: . Acute renal failure superimposed on stage 4 chronic kidney disease ([place]) . Acute on chronic combined systolic and diastolic CHF (congestive heart failure) ([place]) . CKD stage G3b/A1, GFR 30-44 and albumin creatinine ratio <30 mg/g (HCC) . Hypertensive cardiovascular-renal disease, stage 1-4 or unspecified chronic kidney disease, with heart failure ([place]) . Symptomatic anemia . Thrombocytopenia ([place]) . Acute on chronic combined systolic and diastolic congestive heart failure, NYHA class 4 ([place]) . Acute on chronic systolic and diastolic heart failure, NYHA  class 4 ([place])   I have reviewed the medical record, interviewed the patient and family, and examined the patient. The following aspects are pertinent.  Past Medical History:  Diagnosis Date  . Anemia   . Atrial fibrillation ([place])   . Cataract   . Cervical cancer ([place]) 1991   s/p hysterectomy  . Chronic cellulitis   . Chronic combined systolic and diastolic congestive heart failure, NYHA class 2 (HCC)    LVEF 25-30% with restrictive diastolic filling  . Degenerative joint disease   . Diverticulitis   . Essential hypertension   . GERD (gastroesophageal reflux disease)   . Gout   . Kidney stones   . Morbid obesity ([place])   . NICM (nonischemic cardiomyopathy) ([place]) 02/22/2017  . Osteoarthritis   . Permanent atrial fibrillation 05/04/2009   Qualifier: History of  By: Quentin Cornwall CMA, Janett Billow    . 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   Qualifier: Diagnosis of  By: Lovena Le, MD, Martyn Malay   . Sinoatrial node dysfunction (  [place])    Status post PPM - Dr. Lovena Le  . Sleep apnea    CPAP   Social History   Socioeconomic History  . Marital status: Widowed    Spouse name: Not on file  . Number of children: 1  . Years of education: Not on file  . Highest education level: Some college, no degree  Occupational History  . Not on file  Social Needs  . Financial resource strain: Not hard at all  . Food insecurity:    Worry: Never true    Inability: Never true  . Transportation needs:    Medical: No    Non-medical: No  Tobacco Use  . Smoking status: Former Smoker    Packs/day: 1.00    Types: Cigarettes    Last attempt to quit: 09/26/1979    Years since quitting: 39.0  . Smokeless tobacco: Never Used  Substance and Sexual Activity  . Alcohol use: No  . Drug use: No  . Sexual activity: Never  Lifestyle  . Physical activity:    Days per week: Not on file    Minutes per session: Not on file  . Stress: Not on file  Relationships  . Social connections:    Talks on  phone: Not on file    Gets together: Not on file    Attends religious service: Not on file    Active member of club or organization: Not on file    Attends meetings of clubs or organizations: Not on file    Relationship status: Not on file  Other Topics Concern  . Not on file  Social History Narrative  . Not on file   Family History  Problem Relation Age of Onset  . Heart attack Father   . Stroke Mother   . Diabetes Sister   . Colon cancer Other        seven family members  . Liver disease Neg Hx   . Other Neg Hx    Scheduled Meds: . sodium chloride   Intravenous Once  . sodium chloride   Intravenous Once  . sodium chloride   Intravenous Once  . allopurinol  300 mg Oral QPM  . calcitRIOL  0.25 mcg Oral QODAY  . feeding supplement  237 mL Oral TID WC  . ferrous sulfate  325 mg Oral QHS  . furosemide  80 mg Intravenous Q8H  . HYDROcodone-acetaminophen  1 tablet Oral BID  . isosorbide mononitrate  15 mg Oral QHS  . lactose free nutrition  237 mL Oral TID WC  . metolazone  2.5 mg Oral Daily  . multivitamin with minerals  1 tablet Oral QHS  . pantoprazole  40 mg Oral BID  . polyethylene glycol  17 g Oral QHS  . potassium chloride  40 mEq Oral TID  . ENSURE MAX PROTEIN  1 Container Oral BID  . vitamin B-12  500 mcg Oral Daily   Continuous Infusions: PRN Meds:.acetaminophen, diphenhydrAMINE, docusate sodium, ondansetron (ZOFRAN) IV, zolpidem Medications Prior to Admission:  Prior to Admission medications   Medication Sig Start Date End Date Taking? Authorizing Provider  allopurinol (ZYLOPRIM) 300 MG tablet Take 300 mg by mouth every evening.    Yes [provider]  aspirin 325 MG tablet Take 1 tablet (325 mg total) by mouth at bedtime. 09/30/18  Yes Cherene Altes, MD  calcitRIOL (ROCALTROL) 0.25 MCG capsule Take 0.25 mcg by mouth every other day.    Yes [provider]  COD LIVER OIL  PO Take 1 capsule by mouth daily.    Yes [provider]    docusate sodium (COLACE) 100 MG capsule Take 100 mg by mouth daily as needed for mild constipation.    Yes [provider]  feeding supplement (BOOST HIGH PROTEIN) LIQD Take 1 Container by mouth 2 (two) times daily.    Yes [provider]  ferrous sulfate 325 (65 FE) MG tablet Take 325 mg by mouth at bedtime.    Yes [provider]  HYDROcodone-acetaminophen (NORCO) 10-325 MG per tablet Take 1 tablet by mouth See admin instructions. Take 1 tablet by mouth twice daily, may also take 1/2 tablet an additional 2 times during the day as needed for pain   Yes [provider]  isosorbide mononitrate (IMDUR) 30 MG 24 hr tablet Take 0.5 tablets (15 mg total) by mouth daily. Patient taking differently: Take 15 mg by mouth at bedtime.  12/10/17  Yes Fay Records, MD  metolazone (ZAROXOLYN) 2.5 MG tablet Take 2.5 mg by mouth as needed (fluid).    Yes [provider]  Multiple Vitamin (MULTIVITAMIN WITH MINERALS) TABS tablet Take 1 tablet by mouth at bedtime.   Yes [provider]  pantoprazole (PROTONIX) 40 MG tablet Take 1 tablet (40 mg total) by mouth 2 (two) times daily. 10/02/18  Yes Mahala Menghini, PA-C  polyethylene glycol (MIRALAX / GLYCOLAX) packet Take 17 g by mouth at bedtime.   Yes [provider]  potassium chloride SA (K-DUR,KLOR-CON) 20 MEQ tablet Take 1 tablet (20 mEq total) by mouth 2 (two) times daily. Will need an additional 80 MEQ today 09/17/18 Patient taking differently: Take 20 mEq by mouth 2 (two) times daily.  09/17/18  Yes Bensimhon, Shaune Pascal, MD  torsemide (DEMADEX) 20 MG tablet Take 4 tablets (80 mg total) by mouth 2 (two) times daily. May also take 1 tablet (20 mg total) as needed (for weight greater than 246). 07/15/18  Yes Clegg, Amy D, NP  vitamin B-12 (CYANOCOBALAMIN) 500 MCG tablet Take 500 mcg by mouth daily.   Yes [provider]  PRESCRIPTION MEDICATION Inhale into the lungs at bedtime. Bi-Pap    [provider]   Allergies  Allergen Reactions  . Cephalexin Shortness Of Breath  . Codeine Anaphylaxis    REACTION: throat swelling  . Contrast Media [Iodinated Diagnostic Agents] Other (See Comments)    Patient states "I have chronic kidney disease so the doctor said no dye in my veins."  . Peppermint Flavor Shortness Of Breath  . Prednisone Anaphylaxis, Shortness Of Breath and Swelling    REACTION: swelling, S.O.B.  . Tape Other (See Comments)    States plastic tape blisters her skin  . Ciprofloxacin Hives    Tolerating levofloxin   . Latex Itching and Rash  . Penicillins Hives    DID THE REACTION INVOLVE: Swelling of the face/tongue/throat, SOB, or low BP? No Sudden or severe rash/hives, skin peeling, or the inside of the mouth or nose? Yes Did it require medical treatment? Pt was in hospital at the time of reaction When did it last happen?age - mid 40's If all above answers are "NO", may proceed with cephalosporin use.  . Coreg [Carvedilol] Other (See Comments)    Beta Blockers cause her organs to shut down per patient  . Diamox [Acetazolamide] Rash    Rash after 2 doses of diamox    Review of Systems  Unable to perform ROS: Other    Physical Exam  Vitals signs and nursing note reviewed.  Constitutional:      Appearance: She is obese.     Comments: Makes and keeps eye contact, appropriate, appears morbidly obese, somewhat frail  HENT:     Head: Atraumatic.  Cardiovascular:     Rate and Rhythm: Normal rate.  Pulmonary:     Effort: Pulmonary effort is normal. No tachypnea.  Abdominal:     Palpations: Abdomen is soft.     Comments: Obese abdomen  Musculoskeletal:     Right lower leg: Edema present.     Left lower leg: Edema present.  Skin:    General: Skin is warm and dry.     Comments: Blisters left lower leg, skin darkening bilateral lower extremities  Neurological:     Mental Status: She is oriented to person, place, and time.  Psychiatric:      Comments: Calm and cooperative, appropriate     Vital Signs: BP (!) 107/57 (BP Location: Left Wrist)   Pulse 89   Temp 98.2 F (36.8 C) (Oral)   Resp 20   Ht 5' 6"  (1.676 m)   Wt 116.3 kg Comment: scale b  SpO2 97%   BMI 41.37 kg/m  Pain Scale: 0-10   Pain Score: 6    SpO2: SpO2: 97 % O2 Device:SpO2: 97 % O2 Flow Rate: .   IO: Intake/output summary:   Intake/Output Summary (Last 24 hours) at 10/08/2018 1242 Last data filed at 10/08/2018 0511 Gross per 24 hour  Intake 1080 ml  Output 2600 ml  Net -1520 ml    LBM: Last BM Date: 10/07/18 Baseline Weight: Weight: 117.3 kg Most recent weight: Weight: 116.3 kg(scale b)     Palliative Assessment/Data:   Flowsheet Rows     Most Recent Value  Intake Tab  Referral Department  Hospitalist  Unit at Time of Referral  ER  Palliative Care Primary Diagnosis  Cardiac  Date Notified  10/06/18  Palliative Care Type  New Palliative care  Reason for referral  Clarify Goals of Care  Date of Admission  10/06/18  # of days IP prior to Palliative referral  0  Clinical Assessment  Psychosocial & Spiritual Assessment  Palliative Care Outcomes      Time In: 0910 Time Out: 1020 Time Total: 70 minutes Greater than 50%  of this time was spent counseling and coordinating care related to the above assessment and plan.  Signed by: Drue Novel, NP   Please contact Palliative Medicine Team phone at [phone] for questions and concerns.  For individual provider: See Shea Evans

## 2018-10-08 NOTE — Progress Notes (Addendum)
Advanced Heart Failure Rounding Note  PCP-Cardiologist: Dorris Carnes, MD   Subjective:    Diuresed with 80 mg IV lasix TID + 2.5 mg metolazone yesterday with modest UOP (I/O net negative 1.2 L). Weight unchanged. Creatinine 2.94 -> 3.03. K 3.2.   Hemoglobin 6.1 today. Blood bank has to order her blood in due to antibodies, so she has not yet received any blood.   No longer SOB with talking, but remains SOB with minimal movement. Denies bleeding. Had a normal BM yesterday.   Echo 10/07/18: EF 30-35% (down from 40-45% 05/2018). Mild AS, mod MR, RV normal function, mod TR,  PA peak pressure 59 mmHg.   Objective:   Weight Range: 116.3 kg Body mass index is 41.37 kg/m.   Vital Signs:   Temp:  [97.5 F (36.4 C)-98.8 F (37.1 C)] 98.8 F (37.1 C) (01/14 0608) Pulse Rate:  [90-102] 99 (01/14 0608) Resp:  [18-20] 20 (01/14 0608) BP: (108-129)/(69-72) 129/69 (01/14 0608) SpO2:  [96 %-100 %] 96 % (01/14 0608) Weight:  [116.3 kg] 116.3 kg (01/14 0608) Last BM Date: 10/07/18  Weight change: Filed Weights   10/06/18 1604 10/07/18 0425 10/08/18 3016  Weight: 117.3 kg 116.3 kg 116.3 kg    Intake/Output:   Intake/Output Summary (Last 24 hours) at 10/08/2018 0824 Last data filed at 10/08/2018 0757 Gross per 24 hour  Intake 960 ml  Output 2700 ml  Net -1740 ml      Physical Exam    General:  Appears fatigued. SOB with talking.  HEENT: Normal Neck: Supple. JVP to ear. Carotids 2+ bilat; no bruits. No lymphadenopathy or thyromegaly appreciated. Cor: PMI nondisplaced. Regular rate & rhythm. No rubs, gallops or murmurs. Lungs: Diminished RLL.  Abdomen: Soft, nontender, nondistended. No hepatosplenomegaly. No bruits or masses. Good bowel sounds. Extremities: No cyanosis, clubbing, rash, BLE 2+ edema 1/2 way to knees, tender.  Neuro: Alert & orientedx3, cranial nerves grossly intact. moves all 4 extremities w/o difficulty. Affect pleasant   Telemetry   Vpaced 90-120, generally  90s. Personally reviewed.   EKG    No new tracings.   Labs    CBC Recent Labs    10/06/18 1309 10/07/18 0959 10/08/18 0425  WBC 5.2 4.8 4.9  NEUTROABS 3.7  --   --   HGB 7.2* 6.7* 6.1*  HCT 23.6* 21.6* 20.1*  MCV 98.3 98.2 99.5  PLT 105* 97* 95*   Basic Metabolic Panel Recent Labs    10/07/18 1337 10/08/18 0425  NA 132* 133*  K 3.2* 3.2*  CL 90* 91*  CO2 29 31  GLUCOSE 169* 109*  BUN 98* 101*  CREATININE 3.06* 3.03*  CALCIUM 9.2 9.0   Liver Function Tests Recent Labs    10/06/18 1309  AST 33  ALT 22  ALKPHOS 49  BILITOT 1.9*  PROT 6.3*  ALBUMIN 3.5   No results for input(s): LIPASE, AMYLASE in the last 72 hours. Cardiac Enzymes No results for input(s): CKTOTAL, CKMB, CKMBINDEX, TROPONINI in the last 72 hours.  BNP: BNP (last 3 results) Recent Labs    06/20/18 2249 10/06/18 1309  BNP 160.6* 214.8*    ProBNP (last 3 results) No results for input(s): PROBNP in the last 8760 hours.   D-Dimer No results for input(s): DDIMER in the last 72 hours. Hemoglobin A1C No results for input(s): HGBA1C in the last 72 hours. Fasting Lipid Panel No results for input(s): CHOL, HDL, LDLCALC, TRIG, CHOLHDL, LDLDIRECT in the last 72 hours. Thyroid Function Tests  No results for input(s): TSH, T4TOTAL, T3FREE, THYROIDAB in the last 72 hours.  Invalid input(s): FREET3  Other results:   Imaging     No results found.   Medications:     Scheduled Medications: . sodium chloride   Intravenous Once  . sodium chloride   Intravenous Once  . sodium chloride   Intravenous Once  . allopurinol  300 mg Oral QPM  . calcitRIOL  0.25 mcg Oral QODAY  . feeding supplement  237 mL Oral TID WC  . ferrous sulfate  325 mg Oral QHS  . furosemide  80 mg Intravenous Q8H  . heparin  5,000 Units Subcutaneous Q8H  . HYDROcodone-acetaminophen  1 tablet Oral BID  . isosorbide mononitrate  15 mg Oral QHS  . metolazone  2.5 mg Oral Daily  . multivitamin with minerals  1  tablet Oral QHS  . pantoprazole  40 mg Oral BID  . polyethylene glycol  17 g Oral QHS  . potassium chloride  40 mEq Oral BID  . ENSURE MAX PROTEIN  1 Container Oral BID  . vitamin B-12  500 mcg Oral Daily     Infusions:   PRN Medications:  acetaminophen, diphenhydrAMINE, docusate sodium, ondansetron (ZOFRAN) IV, zolpidem    Patient Profile   [name] Wakeman Sargentis a 73 y.o.femalewith a history of morbid obesity, afib s/p AV ablation andSt JudePPM, chronic combined HF, CKD IV, severe MR, TR, pulmonary HTN, OSA on BiPAP, GERD, gout, and iron deficiency anemia.Cardiac cath 2009 with normal coronary arteries.   Admitted with SOB and weight gain.   Assessment/Plan   1. Acute on chronic combined HF: Echo 06/21/18 with EF 40-45%, LV severely dilated, mild LVH, akinesis of mid-apicalanteroseptal and apical myocardium, mild AS, severe MR, LA and RA severely dilated, moderate TR, PA peak pressure 42 mmHg. Cause of cardiomyopathy uncertain. Could be valvular, could be due to chronic RV pacing (has not had coronary angiography that I can see and now with CKD stage IV). [place] 10/4 showed preserved cardiac output with markedly elevated right and left heart filling pressures (very prominent RV failure). TEE looked like restrictive cardiomyopathy. - Volume status remains overloaded. Weight up about 18 lbs. Complicated by CKD IV. Continue lasix 80 mg IV TID + 2.5 mg metolazone daily. She diuresed well on this, but weight unchanged. Likely need to transition to lasix drip. Will discuss with MD.  - She was taken off hydralazine due to soft BP. Continue imdur 15 mg daily  - No spiro/ARB/ACEI/ARNI with CKD stage IV.  - Refuses UNNA boots or TED hose.  - Echo 10/07/18: EF 30-35% (down from 40-45% 05/2018). Mild AS, mod MR, RV normal function, mod TR,  PA peak pressure 59 mmHg.  - With drop in EF, could consider starting BB. Will wait until volume is improved.  - Palliative consulted by primary team.  They have not yet seen.  2. Mitral regurgitation: Severe MR on echo9/27/19 (mild on echo 10/2016). TEE on 10/4 showed moderate-severe central MR with some restriction of posterior leaflet. Tricuspid regurgitation was more impressive.  - She was evaluated by Dr Lorenza Chick 10/16 for possible mitral clip. There was concern mitral clip may not make a difference due to multiple co-morbidities including RV failure with severe TR. - continue medical therapy. MR moderate on echo yesterday.  3. Afib s/p AV nodal ablation and PPM: Chronic RV pacing in the 90-100s. - With dyssynchrony and decreased LV EF, could consider CRT upgrade at some point but doubt that will  improve current situation - She refuses anticoagulation ("caused my teeth to fall out and my hair to fall out"). She will only take ASA. ASA held with anemia.  4. Anemia/Recent GIB. - Hemoglobin 6.1 today. She has not received blood due to antibodies. Should be delivered today.  - GI consulted last admission. She had an EGD that showed erosive gastritis. PPI increased. Refused colonoscopy. May need to get GI involved again. Per primary.  5. OSA: Continue BiPAP qHS. No change.  6. CKD Stage 4: Baseline creatinine around 2.5. - Creatinine 2.94-> 3.03 today. Monitor closely. Follows with Nephrology outpatient. She would consider HD if it was offered to her.  7. Tricuspid regurgitation: -Severe TR on TEE 05/2018. Moderate TR on echo yesterday.  8. Mild aortic stenosis - Mild on echo yesterday.  9. Hypokalemia - K 3.2 today. Supp.  10. Right pleural effusion - Has had since October. May benefit symptomatically to get thoracentesis.  - Follows with Dr Elsworth Soho. Per patient, he has told her thoracentesis would be one of her last options. She has never had it drained before. Repeat CXR today.   Medication concerns reviewed with patient and pharmacy team. Barriers identified: none at this time.   Length of Stay: [place], NP    10/08/2018, 8:24 AM  Advanced Heart Failure Team Pager [phone] (M-F; 7a - 4p)  Please contact [facility] for night-coverage after hours (4p -7a ) and weekends on [email or web address]  Patient seen and examined with the above-signed Advanced Practice Provider and/or Housestaff. I personally reviewed laboratory data, imaging studies and relevant notes. I independently examined the patient and formulated the important aspects of the plan. I have edited the note to reflect any of my changes or salient points. I have personally discussed the plan with the patient and/or family.  She is markedly volume overloaded and anemic. Transfusion delayed due to circulating antibodies but now on her 2nd unit RBCs. On exam she is markedly volume overloaded - up almost 20 pounds. Echo reviewed personally and EF 30-35% range (not much change) with severe TR and moderate to severe MR. She has responded well to IV lasix 120 bid with metolazone during her previous admission so we will resume that. Supp K aggressively. Watch renal function closely. She is DNR.   Glori Bickers, MD  4:24 PM

## 2018-10-08 NOTE — Progress Notes (Signed)
Pt's daughter Marcie Bal) requests to speak to MD regarding ordered thoracentesis. RN notified MD. MD states he will call.

## 2018-10-08 NOTE — Progress Notes (Signed)
Pt is using her home BIPAP with her home settings. Pt able to apply herself. RT will continue to monitor.

## 2018-10-08 NOTE — Telephone Encounter (Signed)
REVIEWED. GB WALL LIKELY ABNL DUE TO CIRRHOSIS LOW PROTEIN. CURRENTLY INPT AT [facility].

## 2018-10-08 NOTE — Progress Notes (Signed)
Pt's scheduled 1800 lasix IVPB not available from pharmacy. Pharmacy states medication is on the way. RN unable to hang medication. RN notified night shift RN.

## 2018-10-08 NOTE — Plan of Care (Signed)
  Problem: Education: Goal: Ability to demonstrate management of disease process will improve Outcome: Progressing Goal: Ability to verbalize understanding of medication therapies will improve Outcome: Progressing   Problem: Activity: Goal: Capacity to carry out activities will improve Outcome: Progressing   Problem: Cardiac: Goal: Ability to achieve and maintain adequate cardiopulmonary perfusion will improve Outcome: Progressing   

## 2018-10-08 NOTE — Progress Notes (Signed)
Pt states she does not drink Boost Resource. Pt states she only drinks chocolate Boost Plus. RN placed order.

## 2018-10-08 NOTE — Progress Notes (Signed)
Blood bank states they need to talk to MD. RN paged MD with blood bank phone number.

## 2018-10-09 ENCOUNTER — Encounter (HOSPITAL_COMMUNITY): Payer: Self-pay | Admitting: Physician Assistant

## 2018-10-09 ENCOUNTER — Inpatient Hospital Stay (HOSPITAL_COMMUNITY): Payer: Medicare Other

## 2018-10-09 ENCOUNTER — Encounter (HOSPITAL_COMMUNITY)
Admission: EM | Disposition: A | Discharge status: Home Health | Payer: Self-pay | Source: Home / Self Care | Attending: Internal Medicine

## 2018-10-09 HISTORY — PX: GIVENS CAPSULE STUDY: SHX5432

## 2018-10-09 HISTORY — PX: IR THORACENTESIS ASP PLEURAL SPACE W/IMG GUIDE: IMG5380

## 2018-10-09 LAB — CBC
HCT: 23.6 % — ABNORMAL LOW (ref 36.0–46.0)
Hemoglobin: 7.3 g/dL — ABNORMAL LOW (ref 12.0–15.0)
MCH: 29.4 pg (ref 26.0–34.0)
MCHC: 30.9 g/dL (ref 30.0–36.0)
MCV: 95.2 fL (ref 80.0–100.0)
Platelets: 94 10*3/uL — ABNORMAL LOW (ref 150–400)
RBC: 2.48 MIL/uL — ABNORMAL LOW (ref 3.87–5.11)
RDW: 23.9 % — ABNORMAL HIGH (ref 11.5–15.5)
WBC: 5.5 10*3/uL (ref 4.0–10.5)
nRBC: 0.4 % — ABNORMAL HIGH (ref 0.0–0.2)

## 2018-10-09 LAB — BASIC METABOLIC PANEL
ANION GAP: 9 (ref 5–15)
BUN: 100 mg/dL — ABNORMAL HIGH (ref 8–23)
CO2: 30 mmol/L (ref 22–32)
CREATININE: 2.99 mg/dL — AB (ref 0.44–1.00)
Calcium: 8.8 mg/dL — ABNORMAL LOW (ref 8.9–10.3)
Chloride: 96 mmol/L — ABNORMAL LOW (ref 98–111)
GFR calc Af Amer: 17 mL/min — ABNORMAL LOW (ref >60)
GFR calc non Af Amer: 15 mL/min — ABNORMAL LOW (ref >60)
Glucose, Bld: 104 mg/dL — ABNORMAL HIGH (ref 70–99)
Potassium: 4 mmol/L (ref 3.5–5.1)
Sodium: 135 mmol/L (ref 135–145)

## 2018-10-09 LAB — MAGNESIUM: Magnesium: 2.4 mg/dL (ref 1.7–2.4)

## 2018-10-09 SURGERY — IMAGING PROCEDURE, GI TRACT, INTRALUMINAL, VIA CAPSULE
Anesthesia: LOCAL

## 2018-10-09 MED ORDER — LIDOCAINE HCL 1 % IJ SOLN
INTRAMUSCULAR | Status: AC
  Filled 2018-10-09: qty 20

## 2018-10-09 MED ORDER — FUROSEMIDE 10 MG/ML IJ SOLN
120.0000 mg | Freq: Three times a day (TID) | INTRAVENOUS | Status: DC
  Administered 2018-10-09 – 2018-10-13 (×14): 120 mg via INTRAVENOUS
  Filled 2018-10-09: qty 12
  Filled 2018-10-09 (×5): qty 10
  Filled 2018-10-09: qty 12
  Filled 2018-10-09: qty 2
  Filled 2018-10-09: qty 12
  Filled 2018-10-09: qty 10
  Filled 2018-10-09: qty 12
  Filled 2018-10-09 (×3): qty 10
  Filled 2018-10-09: qty 12
  Filled 2018-10-09: qty 10

## 2018-10-09 MED ORDER — ORAL CARE MOUTH RINSE: 15.0000 mL | Freq: Two times a day (BID) | OROMUCOSAL | Status: DC

## 2018-10-09 MED ORDER — LIDOCAINE HCL 1 % IJ SOLN
INTRAMUSCULAR | Status: AC | PRN
  Administered 2018-10-09: 10 mL

## 2018-10-09 SURGICAL SUPPLY — 1 items: TOWEL COTTON PACK 4EA (MISCELLANEOUS) ×4 IMPLANT

## 2018-10-09 NOTE — Progress Notes (Signed)
PROGRESS NOTE    [name] [name]  JYN:[id number] DOB: 10/30/1945 DOA: 10/06/2018 PCP: [name], MD   Brief Narrative: Patient is a 73 year old female with history of combined systolic/diastolic CHF, A. fib, CKD stage III-4, liver cirrhosis secondary to [place], status post placement of placement after AV node ablation, severe MR, TR, pulmonary hypertension, OSA on CPAP, iron deficiency anemia who presented with increased shortness of breath.  Admitted for CHF exacerbation.  She reported gaining more than 10 pounds.  Also found to be anemic and an acute kidney injury on CKD.  Cardiology, GI following.  Plan for VCE today and also left-sided thoracentesis.  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 or unspecified chronic kidney disease, with heart failure (HCC) Active Problems:   CKD stage G3b/A1, GFR 30-44 and albumin creatinine ratio <30 mg/g (HCC)   Acute on chronic combined systolic and diastolic CHF (congestive heart failure) (HCC)   Symptomatic anemia   Acute renal failure superimposed on stage 4 chronic kidney disease (HCC)   Hypokalemia   Hyponatremia   Thrombocytopenia (HCC)   Acute on chronic combined systolic and diastolic congestive heart failure, NYHA class 4 (HCC)   Acute on chronic systolic and diastolic heart failure, NYHA class 4 ([place])   Goals of care, counseling/discussion   Palliative care by specialist   DNR (do not resuscitate) discussion  Acute on chronic combined systolic/diastolic CHF: Currently on Lasix 120 mg twice a day.  Also on metolazone.  Continue diuresis.  Continue to monitor input/output. On Imdur.  Not on ACE/ARB or Aldactone due to advanced kidney disease.  Heart failure team following. Echocardiogram done on 10/07/2018 showed ejection fraction of 30 to 35% mid to distal anterior, apical and mid to distal inferior hypokinesis.  Acute on chronic anemia with history of recent GI bleed: Status post 2  units of blood transfusion on 10/08/2018 after her hemoglobin dropped to 6.1.  This morning 7.3.  GI following.  EGD done last hospitalization showed erosive gastritis.  Continue PPI.  Patient declines colonoscopy.  Undergoing video capsule endoscopy today.  Continue to monitor H&H.  Acute on chronic kidney disease stage III-IV: Most likely secondary to cardiorenal syndrome.  Heart failure team following.  Continue diuresis.  Avoid nephrotoxic agents.  Follows with Dr. Jimmy Footman as an outpatient.  Right-sided pleural effusion: Most likely secondary to CHF exacerbation.  Plan for  right-sided ultrasound-guided thoracentesis today.  Hypokalemia: Continue daily replacement.  A. fib with history of AV node ablation/PPM: Has refused anticoagulation in the past.  Aspirin also on hold due to severe anemia.  Currently rate is controlled  Hyponatremia:Hypervolemic hyponatremia. Secondary  to CHF.  Severe mitral regurgitation: Follows with Dr. Burt Knack.  Was considered for possible mitral clip.  OSA: Continue CPAP  Thrombocytopenia: Chronic.  Continue to monitor.  Deconditioning/debility/multiple comorbidities/poor prognosis: Palliative Care following.  She is DNR.         DVT prophylaxis:SCD Code Status: DNR Family Communication: None present at the bedside Disposition Plan: She is from home.  Anticipated discharge to home when she is medically stable.   Consultants: Heart failure, GI  Procedures: Video capsule endoscopy, thoracentesis  Antimicrobials:None  Subjective: Patient seen and examined the bedside this morning.  Continues to complain of dyspnea.  Hemodynamically stable.  Has severe peripheral edema.  Denies any chest pain.  Objective: Vitals:   10/08/18 1609 10/08/18 1941 10/09/18 0510 10/09/18 0654  BP: (!) 119/59 (!) 109/58 112/64   Pulse: 97 92  97   Resp: 16 18 18    Temp: 98.5 F (36.9 C) (!) 97 F (36.1 C) (!) 97 F (36.1 C)   TempSrc: Axillary     SpO2: 100% 96%  98%   Weight:    116.1 kg  Height:        Intake/Output Summary (Last 24 hours) at 10/09/2018 0914 Last data filed at 10/09/2018 7262 Gross per 24 hour  Intake 876 ml  Output 1701 ml  Net -825 ml   Filed Weights   10/07/18 0425 10/08/18 0608 10/09/18 0654  Weight: 116.3 kg 116.3 kg 116.1 kg    Examination:  General exam: Debilitated, generalized weakness, obese HEENT:PERRL,Oral mucosa moist, Ear/Nose normal on gross exam Respiratory system: Bilateral decreased air entry but more on the right Cardiovascular system: S1 & S2 heard, RRR.  Elevated JVD ,No murmurs, rubs, gallops or clicks. Severe bilateral pedal edema. Gastrointestinal system: Abdomen is nondistended, soft and nontender. No organomegaly or masses felt. Normal bowel sounds heard. Central nervous system: Alert and oriented. No focal neurological deficits. Extremities: Severe bilateral lower extremity edema, no clubbing ,no cyanosis, distal peripheral pulses palpable. Skin: Dark discoloration of bilateral lower extremities most likely secondary to chronic venous stasis, No lesions or ulcers,no icterus ,no pallor    Data Reviewed: I have personally reviewed following labs and imaging studies  CBC: Recent Labs  Lab 10/03/18 0938 10/06/18 1309 10/07/18 0959 10/08/18 0425 10/09/18 0514  WBC 5.0 5.2 4.8 4.9 5.5  NEUTROABS 3.6 3.7  --   --   --   HGB 7.7* 7.2* 6.7* 6.1* 7.3*  HCT 25.0* 23.6* 21.6* 20.1* 23.6*  MCV 95 98.3 98.2 99.5 95.2  PLT 118* 105* 97* 95* 94*   Basic Metabolic Panel: Recent Labs  Lab 10/06/18 1309 10/07/18 0502 10/07/18 1337 10/08/18 0425 10/09/18 0514  NA 133* 134* 132* 133* 135  K 3.4* 2.9* 3.2* 3.2* 4.0  CL 93* 92* 90* 91* 96*  CO2 29 31 29 31 30   GLUCOSE 115* 97 169* 109* 104*  BUN 98* 97* 98* 101* 100*  CREATININE 3.00* 2.94* 3.06* 3.03* 2.99*  CALCIUM 9.5 8.8* 9.2 9.0 8.8*  MG  --   --   --   --  2.4   GFR: Estimated Creatinine Clearance: 21.7 mL/min (A) (by C-G formula  based on SCr of 2.99 mg/dL (H)). Liver Function Tests: Recent Labs  Lab 10/06/18 1309  AST 33  ALT 22  ALKPHOS 49  BILITOT 1.9*  PROT 6.3*  ALBUMIN 3.5   No results for input(s): LIPASE, AMYLASE in the last 168 hours. No results for input(s): AMMONIA in the last 168 hours. Coagulation Profile: No results for input(s): INR, PROTIME in the last 168 hours. Cardiac Enzymes: No results for input(s): CKTOTAL, CKMB, CKMBINDEX, TROPONINI in the last 168 hours. BNP (last 3 results) No results for input(s): PROBNP in the last 8760 hours. HbA1C: No results for input(s): HGBA1C in the last 72 hours. CBG: No results for input(s): GLUCAP in the last 168 hours. Lipid Profile: No results for input(s): CHOL, HDL, LDLCALC, TRIG, CHOLHDL, LDLDIRECT in the last 72 hours. Thyroid Function Tests: No results for input(s): TSH, T4TOTAL, FREET4, T3FREE, THYROIDAB in the last 72 hours. Anemia Panel: No results for input(s): VITAMINB12, FOLATE, FERRITIN, TIBC, IRON, RETICCTPCT in the last 72 hours. Sepsis Labs: No results for input(s): PROCALCITON, LATICACIDVEN in the last 168 hours.  Recent Results (from the past 240 hour(s))  Urine culture     Status: Abnormal  Collection Time: 10/06/18 12:56 PM  Result Value Ref Range Status   Specimen Description URINE, RANDOM  Final   Special Requests NONE  Final   Culture (A)  Final    <10,000 COLONIES/mL INSIGNIFICANT GROWTH Performed at [facility], [place] [address] [place], [place] [address]    Report Status 10/07/2018 FINAL  Final         Radiology Studies: Dg Chest 2 View  Result Date: 10/08/2018 CLINICAL DATA:  Pleural effusion, sleep apnea, atrial fibrillation, non ischemic cardiomyopathy, hypertension, chronic combined systolic and diastolic CHF, former smoker EXAM: CHEST - 2 VIEW COMPARISON:  10/06/2018 FINDINGS: LEFT subclavian transvenous pacemaker lead projects over RIGHT ventricle. Enlargement of cardiac silhouette with pulmonary  vascular congestion. Atherosclerotic calcification aorta. Increased RIGHT pleural effusion and basilar atelectasis. No definite infiltrate or pneumothorax. Bones demineralized. IMPRESSION: Enlargement of cardiac silhouette with pulmonary vascular congestion. Increased RIGHT pleural effusion and basilar atelectasis. Electronically Signed   By: Lavonia Dana M.D.   On: 10/08/2018 16:58        Scheduled Meds: . sodium chloride   Intravenous Once  . sodium chloride   Intravenous Once  . sodium chloride   Intravenous Once  . allopurinol  300 mg Oral QPM  . calcitRIOL  0.25 mcg Oral QODAY  . feeding supplement  237 mL Oral TID WC  . ferrous sulfate  325 mg Oral QHS  . HYDROcodone-acetaminophen  1 tablet Oral BID  . isosorbide mononitrate  15 mg Oral QHS  . lactose free nutrition  237 mL Oral TID WC  . metolazone  2.5 mg Oral Daily  . multivitamin with minerals  1 tablet Oral QHS  . pantoprazole  40 mg Oral BID  . polyethylene glycol  17 g Oral QHS  . potassium chloride  40 mEq Oral TID  . ENSURE MAX PROTEIN  1 Container Oral BID  . vitamin B-12  500 mcg Oral Daily   Continuous Infusions: . sodium chloride 900 mL (10/08/18 2004)  . furosemide 120 mg (10/08/18 2005)     LOS: 2 days    Time spent: 35 mins.More than 50% of that time was spent in counseling and/or coordination of care.      Shelly Coss, MD [facility] Pager [phone]  If 7PM-7AM, please contact night-coverage [email or web address] Password [email or web address] 10/09/2018, 9:14 AM

## 2018-10-09 NOTE — Progress Notes (Signed)
Pt is utilizing her home BIPAP machine on her home settings. Pt able to apply herself. RT will continue to monitor.

## 2018-10-09 NOTE — Progress Notes (Addendum)
Advanced Heart Failure Rounding Note  PCP-Cardiologist: Dorris Carnes, MD   Subjective:    Received 80 mg + 120 mg IV lasix + 2.5 mg metolazone with modest UOP. Net negative 1.2 L. Weight unchanged. Creatinine unchanged 2.99  Hemoglobin 6.1 -> 2 uPRBC -> 7.3 today. Platelets 94. GI consulted. Planning for video capsule endoscopy today.  Palliative also consulted. She is now a DNR.    CXR yesterday showed worsening right pleural effusion and thoracentesis was ordered by primary team.   She is more SOB with exertion. Had an episode last night for 30 minutes when she couldn't catch her breath.   Echo 10/07/18: EF 30-35% (down from 40-45% 05/2018). Mild AS, mod MR, RV normal function, mod TR,  PA peak pressure 59 mmHg.   Objective:   Weight Range: 116.1 kg Body mass index is 41.3 kg/m.   Vital Signs:   Temp:  [97 F (36.1 C)-98.5 F (36.9 C)] 97 F (36.1 C) (01/15 0510) Pulse Rate:  [88-98] 97 (01/15 0510) Resp:  [16-22] 18 (01/15 0510) BP: (107-131)/(54-65) 112/64 (01/15 0510) SpO2:  [95 %-100 %] 98 % (01/15 0510) Weight:  [116.1 kg] 116.1 kg (01/15 0654) Last BM Date: 10/08/18  Weight change: Filed Weights   10/07/18 0425 10/08/18 0608 10/09/18 0654  Weight: 116.3 kg 116.3 kg 116.1 kg    Intake/Output:   Intake/Output Summary (Last 24 hours) at 10/09/2018 0902 Last data filed at 10/09/2018 0653 Gross per 24 hour  Intake 1236 ml  Output 1901 ml  Net -665 ml      Physical Exam    General: Appears fatigued. Dyspneic at rest HEENT: Normal Neck: Supple. JVP to ear. Carotids 2+ bilat; no bruits. No thyromegaly or nodule noted. Cor: PMI nondisplaced. RRR, 2/6 TR and MR Lungs: diminished RLL Abdomen: obese Soft, non-tender, non-distended, no HSM. No bruits or masses. +BS  Extremities: No cyanosis, clubbing, or rash. BLE 2-3+ edema, tender Neuro: Alert & orientedx3, cranial nerves grossly intact. moves all 4 extremities w/o difficulty. Affect  pleasant   Telemetry   Vpaced 90s, rarely up to 110s. Personally reviewed.   EKG    No new tracings.   Labs    CBC Recent Labs    10/06/18 1309  10/08/18 0425 10/09/18 0514  WBC 5.2   < > 4.9 5.5  NEUTROABS 3.7  --   --   --   HGB 7.2*   < > 6.1* 7.3*  HCT 23.6*   < > 20.1* 23.6*  MCV 98.3   < > 99.5 95.2  PLT 105*   < > 95* 94*   < > = values in this interval not displayed.   Basic Metabolic Panel Recent Labs    10/08/18 0425 10/09/18 0514  NA 133* 135  K 3.2* 4.0  CL 91* 96*  CO2 31 30  GLUCOSE 109* 104*  BUN 101* 100*  CREATININE 3.03* 2.99*  CALCIUM 9.0 8.8*  MG  --  2.4   Liver Function Tests Recent Labs    10/06/18 1309  AST 33  ALT 22  ALKPHOS 49  BILITOT 1.9*  PROT 6.3*  ALBUMIN 3.5   No results for input(s): LIPASE, AMYLASE in the last 72 hours. Cardiac Enzymes No results for input(s): CKTOTAL, CKMB, CKMBINDEX, TROPONINI in the last 72 hours.  BNP: BNP (last 3 results) Recent Labs    06/20/18 2249 10/06/18 1309  BNP 160.6* 214.8*    ProBNP (last 3 results) No results for input(s): PROBNP  in the last 8760 hours.   D-Dimer No results for input(s): DDIMER in the last 72 hours. Hemoglobin A1C No results for input(s): HGBA1C in the last 72 hours. Fasting Lipid Panel No results for input(s): CHOL, HDL, LDLCALC, TRIG, CHOLHDL, LDLDIRECT in the last 72 hours. Thyroid Function Tests No results for input(s): TSH, T4TOTAL, T3FREE, THYROIDAB in the last 72 hours.  Invalid input(s): FREET3  Other results:   Imaging    Dg Chest 2 View  Result Date: 10/08/2018 CLINICAL DATA:  Pleural effusion, sleep apnea, atrial fibrillation, non ischemic cardiomyopathy, hypertension, chronic combined systolic and diastolic CHF, former smoker EXAM: CHEST - 2 VIEW COMPARISON:  10/06/2018 FINDINGS: LEFT subclavian transvenous pacemaker lead projects over RIGHT ventricle. Enlargement of cardiac silhouette with pulmonary vascular congestion.  Atherosclerotic calcification aorta. Increased RIGHT pleural effusion and basilar atelectasis. No definite infiltrate or pneumothorax. Bones demineralized. IMPRESSION: Enlargement of cardiac silhouette with pulmonary vascular congestion. Increased RIGHT pleural effusion and basilar atelectasis. Electronically Signed   By: Lavonia Dana M.D.   On: 10/08/2018 16:58     Medications:     Scheduled Medications: . sodium chloride   Intravenous Once  . sodium chloride   Intravenous Once  . sodium chloride   Intravenous Once  . allopurinol  300 mg Oral QPM  . calcitRIOL  0.25 mcg Oral QODAY  . feeding supplement  237 mL Oral TID WC  . ferrous sulfate  325 mg Oral QHS  . HYDROcodone-acetaminophen  1 tablet Oral BID  . isosorbide mononitrate  15 mg Oral QHS  . lactose free nutrition  237 mL Oral TID WC  . metolazone  2.5 mg Oral Daily  . multivitamin with minerals  1 tablet Oral QHS  . pantoprazole  40 mg Oral BID  . polyethylene glycol  17 g Oral QHS  . potassium chloride  40 mEq Oral TID  . ENSURE MAX PROTEIN  1 Container Oral BID  . vitamin B-12  500 mcg Oral Daily    Infusions: . sodium chloride 900 mL (10/08/18 2004)  . furosemide 120 mg (10/08/18 2005)    PRN Medications: sodium chloride, acetaminophen, diphenhydrAMINE, docusate sodium, ondansetron (ZOFRAN) IV, zolpidem    Patient Profile   [name] [name] a 73 y.o.femalewith a history of morbid obesity, afib s/p AV ablation andSt JudePPM, chronic combined HF, CKD IV, severe MR, TR, pulmonary HTN, OSA on BiPAP, GERD, gout, and iron deficiency anemia.Cardiac cath 2009 with normal coronary arteries.   Admitted with SOB and weight gain.   Assessment/Plan   1. Acute on chronic combined HF: Echo 06/21/18 with EF 40-45%, LV severely dilated, mild LVH, akinesis of mid-apicalanteroseptal and apical myocardium, mild AS, severe MR, LA and RA severely dilated, moderate TR, PA peak pressure 42 mmHg. Cause of cardiomyopathy  uncertain. Could be valvular, could be due to chronic RV pacing (has not had coronary angiography that I can see and now with CKD stage IV). [place] 10/4 showed preserved cardiac output with markedly elevated right and left heart filling pressures (very prominent RV failure). TEE looked like restrictive cardiomyopathy. - Volume status remains overloaded. Weight up about 18 lbs. Complicated by CKD IV. Continue 120 mg IV lasix BID + 2.5 mg metolazone daily. She has diuresed well with this in the past.  - She was taken off hydralazine due to soft BP. Continue imdur 15 mg daily  - No spiro/ARB/ACEI/ARNI with CKD stage IV.  - Refuses UNNA boots or TED hose.  - Echo 10/07/18: EF 30-35% (down  from 40-45% 05/2018). Mild AS, mod MR, RV normal function, mod TR,  PA peak pressure 59 mmHg.  - With drop in EF, could consider starting BB. Will wait until volume is improved.  - Palliative consulted. She is now a DNR.   2. Mitral regurgitation: Severe MR on echo9/27/19 (mild on echo 10/2016). TEE on 10/4 showed moderate-severe central MR with some restriction of posterior leaflet. Tricuspid regurgitation was more impressive.  - She was evaluated by Dr Lorenza Chick 10/16 for possible mitral clip. There was concern mitral clip may not make a difference due to multiple co-morbidities including RV failure with severe TR. - continue medical therapy. MR moderate on echo yesterday. No change.   3. Afib s/p AV nodal ablation and PPM: Chronic RV pacing in the 90-100s. - With dyssynchrony and decreased LV EF, could consider CRT upgrade at some point but doubt that will improve current situation - She refuses anticoagulation ("caused my teeth to fall out and my hair to fall out"). She will only take ASA. ASA held with anemia. No change.  4. Anemia/Recent GIB. - Hemoglobin 7.3 after 2u PRBCs. Discussed with primary. He would like to hold off on more blood today.  - GI consulted last admission. She had an EGD that showed  erosive gastritis. PPI increased. Refused colonoscopy. GI consulted. Getting capsule study today.  5. OSA: Continue BiPAP qHS. No change.  6. CKD Stage 4: Baseline creatinine around 2.5. - Creatinine 2.94-> 3.03 -> 2.99 today. Monitor closely. Follows with Nephrology outpatient. 7. Tricuspid regurgitation: -Severe TR on TEE 05/2018. Moderate TR on echo yesterday (severe per Dr Haroldine Laws).  8. Mild aortic stenosis - Mild on echo yesterday. No change.  9. Hypokalemia - K 4.0  10. Right pleural effusion - Has had since October. May benefit symptomatically to get thoracentesis.  - Follows with Dr Elsworth Soho. Per patient, he has told her thoracentesis would be one of her last options. CXR yesterday showed worsening pleural effusion. Thoracentesis ordered by primary for today.   Medication concerns reviewed with patient and pharmacy team. Barriers identified: none at this time.   Length of Stay: [employer], NP  10/09/2018, 9:02 AM  Advanced Heart Failure Team Pager [phone] (M-F; 7a - 4p)  Please contact [facility] for night-coverage after hours (4p -7a ) and weekends on [email or web address]   Patient seen and examined with the above-signed Advanced Practice Provider and/or Housestaff. I personally reviewed laboratory data, imaging studies and relevant notes. I independently examined the patient and formulated the important aspects of the plan. I have edited the note to reflect any of my changes or salient points. I have personally discussed the plan with the patient and/or family.  She is much more dyspneic today. Weight up despite IV lasix and metlalzone. CXR shows increasing R pleural effusion (Personally reviewed). Agree with plan for thoracentesis. Code status discussed and she is now DNR. Hopefully respiratory status will improve with thoracentesis. Will increase lasix to 120 IV tid. Continue metolazone, Watch renal function and electrolytes.   Glori Bickers, MD  1:28 PM

## 2018-10-09 NOTE — Procedures (Signed)
PROCEDURE SUMMARY:  Successful image-guided right thoracentesis. Yielded 1.7 liters of golden yellow fluid. Patient tolerated procedure well. EBL: Zero No immediate complications.  Specimen was not sent for labs.  Post procedure CXR shows no pneumothorax.  Please see imaging section of Epic for full dictation.  Joaquim Nam PA-C 10/09/2018 1:28 PM

## 2018-10-09 NOTE — Progress Notes (Signed)
Pt does not want to sign consent without her daughter present or the Provider.

## 2018-10-09 NOTE — Progress Notes (Signed)
Patient ingested Small Bowel Given capsule.  Tolerated well.  Instructions given to patient and Floor RN. Verbalized understanding

## 2018-10-09 NOTE — Plan of Care (Signed)
?  Problem: Education: ?Goal: Ability to demonstrate management of disease process will improve ?Outcome: Progressing ?  ?

## 2018-10-10 LAB — LACTATE DEHYDROGENASE: LDH: 447 U/L — ABNORMAL HIGH (ref 98–192)

## 2018-10-10 LAB — TYPE AND SCREEN
ABO/RH(D): O POS
Antibody Screen: POSITIVE
DAT, IgG: POSITIVE
Unit division: 0
Unit division: 0
Unit division: 0

## 2018-10-10 LAB — CBC
HCT: 26.6 % — ABNORMAL LOW (ref 36.0–46.0)
Hemoglobin: 8 g/dL — ABNORMAL LOW (ref 12.0–15.0)
MCH: 28.8 pg (ref 26.0–34.0)
MCHC: 30.1 g/dL (ref 30.0–36.0)
MCV: 95.7 fL (ref 80.0–100.0)
NRBC: 0 % (ref 0.0–0.2)
Platelets: 102 10*3/uL — ABNORMAL LOW (ref 150–400)
RBC: 2.78 MIL/uL — ABNORMAL LOW (ref 3.87–5.11)
RDW: 24.7 % — ABNORMAL HIGH (ref 11.5–15.5)
WBC: 5.2 10*3/uL (ref 4.0–10.5)

## 2018-10-10 LAB — BPAM RBC
BLOOD PRODUCT EXPIRATION DATE: 202002202359
Blood Product Expiration Date: 202001242359
Blood Product Expiration Date: 202001312359
ISSUE DATE / TIME: 202001140912
ISSUE DATE / TIME: 202001141237
UNIT TYPE AND RH: 5100
Unit Type and Rh: 5100
Unit Type and Rh: 5100

## 2018-10-10 LAB — BASIC METABOLIC PANEL
Anion gap: 13 (ref 5–15)
BUN: 97 mg/dL — ABNORMAL HIGH (ref 8–23)
CO2: 32 mmol/L (ref 22–32)
Calcium: 9.1 mg/dL (ref 8.9–10.3)
Chloride: 91 mmol/L — ABNORMAL LOW (ref 98–111)
Creatinine, Ser: 2.99 mg/dL — ABNORMAL HIGH (ref 0.44–1.00)
GFR calc Af Amer: 17 mL/min — ABNORMAL LOW (ref >60)
GFR calc non Af Amer: 15 mL/min — ABNORMAL LOW (ref >60)
Glucose, Bld: 110 mg/dL — ABNORMAL HIGH (ref 70–99)
Potassium: 3.7 mmol/L (ref 3.5–5.1)
Sodium: 136 mmol/L (ref 135–145)

## 2018-10-10 MED ORDER — BOOST PLUS PO LIQD
237.0000 mL | Freq: Three times a day (TID) | ORAL | Status: DC
  Administered 2018-10-10 – 2018-10-18 (×18): 237 mL via ORAL
  Filled 2018-10-10 (×30): qty 237

## 2018-10-10 MED ORDER — BOOST / RESOURCE BREEZE PO LIQD CUSTOM: 237.0000 mL | Freq: Three times a day (TID) | ORAL | Status: DC

## 2018-10-10 MED ORDER — POTASSIUM CHLORIDE CRYS ER 20 MEQ PO TBCR
20.0000 meq | EXTENDED_RELEASE_TABLET | Freq: Once | ORAL | Status: AC
  Administered 2018-10-10: 20 meq via ORAL
  Filled 2018-10-10: qty 1

## 2018-10-10 MED ORDER — METHOCARBAMOL 500 MG PO TABS
500.0000 mg | ORAL_TABLET | Freq: Four times a day (QID) | ORAL | Status: DC | PRN
  Administered 2018-10-10 – 2018-10-15 (×2): 500 mg via ORAL
  Filled 2018-10-10 (×2): qty 1

## 2018-10-10 NOTE — Plan of Care (Signed)
?  Problem: Education: ?Goal: Ability to demonstrate management of disease process will improve ?Outcome: Progressing ?  ?

## 2018-10-10 NOTE — Care Management Important Message (Signed)
Important Message  Patient Details  Name: [name] [name] MRN: [id number] Date of Birth: Mar 06, 1946   Medicare Important Message Given:  Yes    Kristl Morioka P Carlis Burnsworth 10/10/2018, 4:07 PM

## 2018-10-10 NOTE — Progress Notes (Signed)
PROGRESS NOTE    [name] [name]  JSE:[id number] DOB: July 02, 1946 DOA: 10/06/2018 PCP: [name], MD   Brief Narrative: Patient is a 73 year old female with history of combined systolic/diastolic CHF, A. fib, CKD stage III-4, liver cirrhosis secondary to [place], status post placement of placement after AV node ablation, severe MR, TR, pulmonary hypertension, OSA on CPAP, iron deficiency anemia who presented with increased shortness of breath.  Admitted for CHF exacerbation.  She reported gaining more than 10 pounds.  Also found to be anemic and an acute kidney injury on CKD.  Cardiology, GI following.  Underwent VCE  and also left-sided thoracentesis with removal of 1.7 liters of fluid on 10/09/18.  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 or unspecified chronic kidney disease, with heart failure (HCC) Active Problems:   CKD stage G3b/A1, GFR 30-44 and albumin creatinine ratio <30 mg/g (HCC)   Acute on chronic combined systolic and diastolic CHF (congestive heart failure) (HCC)   Symptomatic anemia   Acute renal failure superimposed on stage 4 chronic kidney disease (HCC)   Hypokalemia   Hyponatremia   Thrombocytopenia (HCC)   Acute on chronic combined systolic and diastolic congestive heart failure, NYHA class 4 (HCC)   Acute on chronic systolic and diastolic heart failure, NYHA class 4 (HCC)   Goals of care, counseling/discussion   Palliative care by specialist   DNR (do not resuscitate) discussion  Acute on chronic combined systolic/diastolic CHF: Currently on Lasix 120 mg TID.  Also on metolazone.  Continue diuresis.  Has been having good diuresis.Continue to monitor input/output. On Imdur.  Not on ACE/ARB or Aldactone due to advanced kidney disease.  Heart failure team following. Echocardiogram done on 10/07/2018 showed ejection fraction of 30 to 35% mid to distal anterior, apical and mid to distal inferior hypokinesis.  Acute  on chronic anemia with history of recent GI bleed: Status post 2 units of blood transfusion on 10/08/2018 after her hemoglobin dropped to 6.1.  This morning 8.  GI following.  EGD done last hospitalization showed erosive gastritis.  Continue PPI.  Patient declines colonoscopy.  Underwent video capsule endoscopy on 10/09/17.GI following.  Acute on chronic kidney disease stage III-IV:  Cardiorenal syndrome.Creatinine pleautued at 2.99.  Heart failure team following.  Continue diuresis.  Avoid nephrotoxic agents.  Follows with Dr. Jimmy Footman as an outpatient.  Right-sided pleural effusion:Secondary to CHF exacerbation.  Underwent ultrasound-guided thoracentesis with goal of 1.7 L of fluid on 10/09/2018  Hypokalemia: Continue daily replacement.  A. fib with history of AV node ablation/PPM: Has refused anticoagulation in the past.  Aspirin also on hold due to severe anemia.  Currently rate is controlled  Hyponatremia:Hypervolemic hyponatremia. Secondary  to CHF.  Severe mitral regurgitation: Follows with Dr. Burt Knack.  Was considered for possible mitral clip.  OSA: Continue CPAP  Thrombocytopenia: Chronic.  Continue to monitor.  Deconditioning/debility/multiple comorbidities/poor prognosis: Palliative Care following.  She is DNR.         DVT prophylaxis:SCD Code Status: DNR Family Communication: None present at the bedside Disposition Plan: She is from home.  Anticipated discharge to home when she is medically stable.   Consultants: Heart failure, GI  Procedures: Video capsule endoscopy, thoracentesis  Antimicrobials:None  Subjective: Patient seen and examined the bedside this morning.    Hemodynamically stable.Looks more comfortable today. Dyspnea better.  Still has severe peripheral edema.  Denies any chest pain.  Objective: Vitals:   10/09/18 1444 10/09/18 1929 10/09/18 2234 10/10/18 0737  BP: (!) 117/55 94/66  119/73  Pulse: 90 96 94 (!) 102  Resp: 18 18 18 16   Temp: 98 F  (36.7 C) 98.2 F (36.8 C)  98.6 F (37 C)  TempSrc: Oral Oral  Oral  SpO2: 95% 97% 97% 95%  Weight:    114.1 kg  Height:        Intake/Output Summary (Last 24 hours) at 10/10/2018 1139 Last data filed at 10/10/2018 1044 Gross per 24 hour  Intake 1658.91 ml  Output 3300 ml  Net -1641.09 ml   Filed Weights   10/08/18 0608 10/09/18 0654 10/10/18 0552  Weight: 116.3 kg 116.1 kg 114.1 kg    Examination:  General exam: Debilitated, generalized weakness, obese HEENT:PERRL,Oral mucosa moist, Ear/Nose normal on gross exam Respiratory system: Bilateral decreased air entry on the bases Cardiovascular system: S1 & S2 heard, RRR.  Elevated JVD ,No murmurs, rubs, gallops or clicks. Severe bilateral pedal edema. Gastrointestinal system: Abdomen is nondistended, soft and nontender. No organomegaly or masses felt. Normal bowel sounds heard. Central nervous system: Alert and oriented. No focal neurological deficits. Extremities: Severe bilateral lower extremity edema, no clubbing ,no cyanosis, distal peripheral pulses palpable. Skin: Dark discoloration of bilateral lower extremities most likely secondary to chronic venous stasis, No lesions or ulcers,no icterus ,no pallor    Data Reviewed: I have personally reviewed following labs and imaging studies  CBC: Recent Labs  Lab 10/06/18 1309 10/07/18 0959 10/08/18 0425 10/09/18 0514 10/10/18 0610  WBC 5.2 4.8 4.9 5.5 5.2  NEUTROABS 3.7  --   --   --   --   HGB 7.2* 6.7* 6.1* 7.3* 8.0*  HCT 23.6* 21.6* 20.1* 23.6* 26.6*  MCV 98.3 98.2 99.5 95.2 95.7  PLT 105* 97* 95* 94* 431*   Basic Metabolic Panel: Recent Labs  Lab 10/07/18 0502 10/07/18 1337 10/08/18 0425 10/09/18 0514 10/10/18 0610  NA 134* 132* 133* 135 136  K 2.9* 3.2* 3.2* 4.0 3.7  CL 92* 90* 91* 96* 91*  CO2 31 29 31 30  32  GLUCOSE 97 169* 109* 104* 110*  BUN 97* 98* 101* 100* 97*  CREATININE 2.94* 3.06* 3.03* 2.99* 2.99*  CALCIUM 8.8* 9.2 9.0 8.8* 9.1  MG  --   --    --  2.4  --    GFR: Estimated Creatinine Clearance: 21.5 mL/min (A) (by C-G formula based on SCr of 2.99 mg/dL (H)). Liver Function Tests: Recent Labs  Lab 10/06/18 1309  AST 33  ALT 22  ALKPHOS 49  BILITOT 1.9*  PROT 6.3*  ALBUMIN 3.5   No results for input(s): LIPASE, AMYLASE in the last 168 hours. No results for input(s): AMMONIA in the last 168 hours. Coagulation Profile: No results for input(s): INR, PROTIME in the last 168 hours. Cardiac Enzymes: No results for input(s): CKTOTAL, CKMB, CKMBINDEX, TROPONINI in the last 168 hours. BNP (last 3 results) No results for input(s): PROBNP in the last 8760 hours. HbA1C: No results for input(s): HGBA1C in the last 72 hours. CBG: No results for input(s): GLUCAP in the last 168 hours. Lipid Profile: No results for input(s): CHOL, HDL, LDLCALC, TRIG, CHOLHDL, LDLDIRECT in the last 72 hours. Thyroid Function Tests: No results for input(s): TSH, T4TOTAL, FREET4, T3FREE, THYROIDAB in the last 72 hours. Anemia Panel: No results for input(s): VITAMINB12, FOLATE, FERRITIN, TIBC, IRON, RETICCTPCT in the last 72 hours. Sepsis Labs: No results for input(s): PROCALCITON, LATICACIDVEN in the last 168 hours.  Recent Results (from the past 240 hour(s))  Urine culture     Status: Abnormal   Collection Time: 10/06/18 12:56 PM  Result Value Ref Range Status   Specimen Description URINE, RANDOM  Final   Special Requests NONE  Final   Culture (A)  Final    <10,000 COLONIES/mL INSIGNIFICANT GROWTH Performed at [facility], [place] [address] [place], [place] [address]    Report Status 10/07/2018 FINAL  Final         Radiology Studies: Dg Chest 1 View  Result Date: 10/09/2018 CLINICAL DATA:  Status post right thoracentesis today. EXAM: CHEST  1 VIEW COMPARISON:  PA and lateral chest 10/08/2018. FINDINGS: Right pleural effusion seen on the prior examination is markedly decreased after thoracentesis. No pneumothorax. Lungs are clear.  Massive cardiomegaly is unchanged. IMPRESSION: Marked decrease in right pleural effusion after thoracentesis. Negative for pneumothorax. Cardiomegaly. Electronically Signed   By: Inge Rise M.D.   On: 10/09/2018 12:01   Dg Chest 2 View  Result Date: 10/08/2018 CLINICAL DATA:  Pleural effusion, sleep apnea, atrial fibrillation, non ischemic cardiomyopathy, hypertension, chronic combined systolic and diastolic CHF, former smoker EXAM: CHEST - 2 VIEW COMPARISON:  10/06/2018 FINDINGS: LEFT subclavian transvenous pacemaker lead projects over RIGHT ventricle. Enlargement of cardiac silhouette with pulmonary vascular congestion. Atherosclerotic calcification aorta. Increased RIGHT pleural effusion and basilar atelectasis. No definite infiltrate or pneumothorax. Bones demineralized. IMPRESSION: Enlargement of cardiac silhouette with pulmonary vascular congestion. Increased RIGHT pleural effusion and basilar atelectasis. Electronically Signed   By: Lavonia Dana M.D.   On: 10/08/2018 16:58   Ir Thoracentesis Asp Pleural Space W/img Guide  Result Date: 10/09/2018 INDICATION: Patient with history of CHD, a.fib, severe MR, OSA, CKD, NASH liver cirrhosis recently admitted for CHF exacerbation - noted to have right sided pleural effusion on CXR. Request for therapeutic thoracentesis today in IR. EXAM: ULTRASOUND GUIDED RIGHT THORACENTESIS MEDICATIONS: 10 mL 1% lidocaine COMPLICATIONS: None immediate. PROCEDURE: An ultrasound guided thoracentesis was thoroughly discussed with the patient and questions answered. The benefits, risks, alternatives and complications were also discussed. The patient understands and wishes to proceed with the procedure. Written consent was obtained. Ultrasound was performed to localize and mark an adequate pocket of fluid in the right chest. The area was then prepped and draped in the normal sterile fashion. 1% Lidocaine was used for local anesthesia. Under ultrasound guidance a 6 Fr  Safe-T-Centesis catheter was introduced. Thoracentesis was performed. The catheter was removed and a dressing applied. FINDINGS: A total of approximately 1.7 L of golden yellow fluid was removed. IMPRESSION: Successful ultrasound guided right thoracentesis yielding 1.7 L of pleural fluid. Read by Candiss Norse, PA-C Electronically Signed   By: Aletta Edouard M.D.   On: 10/09/2018 13:34        Scheduled Meds: . sodium chloride   Intravenous Once  . sodium chloride   Intravenous Once  . sodium chloride   Intravenous Once  . allopurinol  300 mg Oral QPM  . calcitRIOL  0.25 mcg Oral QODAY  . ferrous sulfate  325 mg Oral QHS  . HYDROcodone-acetaminophen  1 tablet Oral BID  . isosorbide mononitrate  15 mg Oral QHS  . lactose free nutrition  237 mL Oral TID WC  . metolazone  2.5 mg Oral Daily  . multivitamin with minerals  1 tablet Oral QHS  . pantoprazole  40 mg Oral BID  . polyethylene glycol  17 g Oral QHS  . potassium chloride  40 mEq Oral TID  . vitamin B-12  500 mcg  Oral Daily   Continuous Infusions: . sodium chloride 10 mL/hr at 10/10/18 1044  . furosemide Stopped (10/10/18 1037)     LOS: 3 days    Time spent: 35 mins.More than 50% of that time was spent in counseling and/or coordination of care.      Shelly Coss, MD [facility] Pager [phone]  If 7PM-7AM, please contact night-coverage [email or web address] Password [facility] 10/10/2018, 11:39 AM

## 2018-10-10 NOTE — Progress Notes (Addendum)
Advanced Heart Failure Rounding Note  PCP-Cardiologist: Dorris Carnes, MD   Subjective:    Lasix increased to 120 mg IV TID yesterday. UOP increased with -1.4 L. Weight down 4 lbs overnight (7 lbs total). Creatinine unchanged 2.99.   Hemoglobin improving. 8.0 today. Platelets 102. LDH elevated 447 - ?hemolysis. GI following. Had video capsule endoscopy yesterday.   Had thoracentesis yesterday with 1.7 L golden yellow fluid.    SOB is better today. Able to get to [facility] with no SOB. Denies bleeding. Had 3 normal BMs yesterday.   Echo 10/07/18: EF 30-35% (down from 40-45% 05/2018). Mild AS, mod MR, RV normal function, mod TR,  PA peak pressure 59 mmHg.   Objective:   Weight Range: 114.1 kg Body mass index is 40.59 kg/m.   Vital Signs:   Temp:  [98 F (36.7 C)-98.6 F (37 C)] 98.6 F (37 C) (01/16 0552) Pulse Rate:  [90-102] 102 (01/16 0552) Resp:  [16-18] 16 (01/16 0552) BP: (94-119)/(55-73) 119/73 (01/16 0552) SpO2:  [95 %-97 %] 95 % (01/16 0552) Weight:  [114.1 kg] 114.1 kg (01/16 0552) Last BM Date: 10/10/18  Weight change: Filed Weights   10/08/18 0608 10/09/18 0654 10/10/18 0552  Weight: 116.3 kg 116.1 kg 114.1 kg    Intake/Output:   Intake/Output Summary (Last 24 hours) at 10/10/2018 0818 Last data filed at 10/10/2018 0500 Gross per 24 hour  Intake 1115.48 ml  Output 2600 ml  Net -1484.52 ml      Physical Exam    General: Appears fatigued.  HEENT: Normal Neck: Supple. JVP 8.0. Carotids 2+ bilat; no bruits. No thyromegaly or nodule noted. Cor: PMI nondisplaced. RRR, 2/6 TR and MR Lungs: CTAB, normal effort. Abdomen: Soft, non-tender, non-distended, no HSM. No bruits or masses. +BS  Extremities: No cyanosis, clubbing, or rash. R and LLE 1+ edema, tender.   Neuro: Alert & orientedx3, cranial nerves grossly intact. moves all 4 extremities w/o difficulty. Affect pleasant  Telemetry   Vpaced 90s. Personally reviewed.   EKG    No new tracings.   Labs      CBC Recent Labs    10/09/18 0514 10/10/18 0610  WBC 5.5 5.2  HGB 7.3* 8.0*  HCT 23.6* 26.6*  MCV 95.2 95.7  PLT 94* 258*   Basic Metabolic Panel Recent Labs    10/09/18 0514 10/10/18 0610  NA 135 136  K 4.0 3.7  CL 96* 91*  CO2 30 32  GLUCOSE 104* 110*  BUN 100* 97*  CREATININE 2.99* 2.99*  CALCIUM 8.8* 9.1  MG 2.4  --    Liver Function Tests No results for input(s): AST, ALT, ALKPHOS, BILITOT, PROT, ALBUMIN in the last 72 hours. No results for input(s): LIPASE, AMYLASE in the last 72 hours. Cardiac Enzymes No results for input(s): CKTOTAL, CKMB, CKMBINDEX, TROPONINI in the last 72 hours.  BNP: BNP (last 3 results) Recent Labs    06/20/18 2249 10/06/18 1309  BNP 160.6* 214.8*    ProBNP (last 3 results) No results for input(s): PROBNP in the last 8760 hours.   D-Dimer No results for input(s): DDIMER in the last 72 hours. Hemoglobin A1C No results for input(s): HGBA1C in the last 72 hours. Fasting Lipid Panel No results for input(s): CHOL, HDL, LDLCALC, TRIG, CHOLHDL, LDLDIRECT in the last 72 hours. Thyroid Function Tests No results for input(s): TSH, T4TOTAL, T3FREE, THYROIDAB in the last 72 hours.  Invalid input(s): FREET3  Other results:   Imaging    Dg Chest 1  View  Result Date: 10/09/2018 CLINICAL DATA:  Status post right thoracentesis today. EXAM: CHEST  1 VIEW COMPARISON:  PA and lateral chest 10/08/2018. FINDINGS: Right pleural effusion seen on the prior examination is markedly decreased after thoracentesis. No pneumothorax. Lungs are clear. Massive cardiomegaly is unchanged. IMPRESSION: Marked decrease in right pleural effusion after thoracentesis. Negative for pneumothorax. Cardiomegaly. Electronically Signed   By: Inge Rise M.D.   On: 10/09/2018 12:01   Ir Thoracentesis Asp Pleural Space W/img Guide  Result Date: 10/09/2018 INDICATION: Patient with history of CHD, a.fib, severe MR, OSA, CKD, NASH liver cirrhosis recently  admitted for CHF exacerbation - noted to have right sided pleural effusion on CXR. Request for therapeutic thoracentesis today in IR. EXAM: ULTRASOUND GUIDED RIGHT THORACENTESIS MEDICATIONS: 10 mL 1% lidocaine COMPLICATIONS: None immediate. PROCEDURE: An ultrasound guided thoracentesis was thoroughly discussed with the patient and questions answered. The benefits, risks, alternatives and complications were also discussed. The patient understands and wishes to proceed with the procedure. Written consent was obtained. Ultrasound was performed to localize and mark an adequate pocket of fluid in the right chest. The area was then prepped and draped in the normal sterile fashion. 1% Lidocaine was used for local anesthesia. Under ultrasound guidance a 6 Fr Safe-T-Centesis catheter was introduced. Thoracentesis was performed. The catheter was removed and a dressing applied. FINDINGS: A total of approximately 1.7 L of golden yellow fluid was removed. IMPRESSION: Successful ultrasound guided right thoracentesis yielding 1.7 L of pleural fluid. Read by Candiss Norse, PA-C Electronically Signed   By: Aletta Edouard M.D.   On: 10/09/2018 13:34     Medications:     Scheduled Medications: . sodium chloride   Intravenous Once  . sodium chloride   Intravenous Once  . sodium chloride   Intravenous Once  . allopurinol  300 mg Oral QPM  . calcitRIOL  0.25 mcg Oral QODAY  . feeding supplement  237 mL Oral TID WC  . ferrous sulfate  325 mg Oral QHS  . HYDROcodone-acetaminophen  1 tablet Oral BID  . isosorbide mononitrate  15 mg Oral QHS  . lactose free nutrition  237 mL Oral TID WC  . mouth rinse  15 mL Mouth Rinse BID  . metolazone  2.5 mg Oral Daily  . multivitamin with minerals  1 tablet Oral QHS  . pantoprazole  40 mg Oral BID  . polyethylene glycol  17 g Oral QHS  . potassium chloride  40 mEq Oral TID  . ENSURE MAX PROTEIN  1 Container Oral BID  . vitamin B-12  500 mcg Oral Daily    Infusions: .  sodium chloride Stopped (10/10/18 0026)  . furosemide Stopped (10/10/18 0019)    PRN Medications: sodium chloride, acetaminophen, diphenhydrAMINE, docusate sodium, ondansetron (ZOFRAN) IV, zolpidem    Patient Profile   [name] [name] a 73 y.o.femalewith a history of morbid obesity, afib s/p AV ablation andSt JudePPM, chronic combined HF, CKD IV, severe MR, TR, pulmonary HTN, OSA on BiPAP, GERD, gout, and iron deficiency anemia.Cardiac cath 2009 with normal coronary arteries.   Admitted with SOB and weight gain.   Assessment/Plan   1. Acute on chronic combined HF: Echo 06/21/18 with EF 40-45%, LV severely dilated, mild LVH, akinesis of mid-apicalanteroseptal and apical myocardium, mild AS, severe MR, LA and RA severely dilated, moderate TR, PA peak pressure 42 mmHg. Cause of cardiomyopathy uncertain. Could be valvular, could be due to chronic RV pacing (has not had coronary angiography that I can see  and now with CKD stage IV). [place] 10/4 showed preserved cardiac output with markedly elevated right and left heart filling pressures (very prominent RV failure). TEE looked like restrictive cardiomyopathy. - Volume status overloaded, but improving. Weight down 7 lbs total (came in 20 lbs up). Continue 120 mg IV lasix TID + metolazone 2.5 mg x 1.  - She was taken off hydralazine due to soft BP. Continue imdur 15 mg daily  - No spiro/ARB/ACEI/ARNI with CKD stage IV.  - Refuses UNNA boots or TED hose.  - Echo 10/07/18: EF 30-35% (down from 40-45% 05/2018). Mild AS, mod MR, RV normal function, mod TR,  PA peak pressure 59 mmHg.  - With drop in EF, could consider starting BB. Will wait until volume is improved.  - Palliative consulted. She is now a DNR/DNI.   2. Mitral regurgitation: Severe MR on echo9/27/19 (mild on echo 10/2016). TEE on 10/4 showed moderate-severe central MR with some restriction of posterior leaflet. Tricuspid regurgitation was more impressive.  - She was evaluated  by Dr Lorenza Chick 10/16 for possible mitral clip. There was concern mitral clip may not make a difference due to multiple co-morbidities including RV failure with severe TR. - continue medical therapy. MR moderate on echo this admit. No change.  3. Afib s/p AV nodal ablation and PPM: Chronic RV pacing in the 90-100s. - With dyssynchrony and decreased LV EF, could consider CRT upgrade at some point but doubt that will improve current situation - She refuses anticoagulation ("caused my teeth to fall out and my hair to fall out"). She will only take ASA. ASA held with anemia. No change.  4. Anemia/Recent GIB. - Hemoglobin improving, 8.0 today.  - GI consulted last admission. She had an EGD that showed erosive gastritis. PPI increased. Refused colonoscopy. GI consulted. Had capsule study yesterday.  - LDH 447.  5. OSA: Continue BiPAP qHS. No change.  6. CKD Stage 4: Baseline creatinine around 2.5. - Creatinine 2.94-> 3.03 -> 2.99 -> 2.99today. Monitor closely. Follows with Nephrology outpatient. 7. Tricuspid regurgitation: -Severe TR on TEE 05/2018. Moderate TR on echo yesterday (severe per Dr Haroldine Laws). No change.  8. Mild aortic stenosis - Mild on echo yesterday. No change.  9. Hypokalemia - K 3.7 10. Right pleural effusion - S/p thoracentesis 1/16 with 1.7 L golden yellow fluid off. Nothing sent for testing.   Medication concerns reviewed with patient and pharmacy team. Barriers identified: none at this time.   Length of Stay: [place], NP  10/10/2018, 8:18 AM  Advanced Heart Failure Team Pager [phone] (M-F; 7a - 4p)  Please contact [facility] for night-coverage after hours (4p -7a ) and weekends on [email or web address]  Patient seen and examined with the above-signed Advanced Practice Provider and/or Housestaff. I personally reviewed laboratory data, imaging studies and relevant notes. I independently examined the patient and formulated the important aspects of the plan. I have  edited the note to reflect any of my changes or salient points. I have personally discussed the plan with the patient and/or family.  Symptomatically improved after thoracentesis but remains volume overloaded. Weight down 4 pounds with IV lasix. Creatinine elevated but stable.HGb stable at 8.0. LDH elevated at 447 and I wonder if she may be having some hemolysis off of her valves. Capsule endoscopy was negative. Will continue IV diuresis. I discussed with her daughter by phone personally.   Glori Bickers, MD  6:26 PM

## 2018-10-10 NOTE — Progress Notes (Signed)
RT NOTE:  Pt has home machine in room and manages.

## 2018-10-10 NOTE — Progress Notes (Signed)
Patient says she was SHOB (O2 sats WNL), says her daughter suggested that another CXR should be done especially after thoracentesis. Will notify MD.

## 2018-10-10 NOTE — Progress Notes (Signed)
Patient having hand cramps, MD paged.

## 2018-10-10 NOTE — Progress Notes (Signed)
The capsule endoscopy was a normal examination.  No further inpatient GI work up.  Follow up with primary GI.  Signing off.

## 2018-10-11 DIAGNOSIS — J90 Pleural effusion, not elsewhere classified: Secondary | ICD-10-CM

## 2018-10-11 DIAGNOSIS — D649 Anemia, unspecified: Secondary | ICD-10-CM

## 2018-10-11 DIAGNOSIS — Z7189 Other specified counseling: Secondary | ICD-10-CM

## 2018-10-11 DIAGNOSIS — E876 Hypokalemia: Secondary | ICD-10-CM

## 2018-10-11 DIAGNOSIS — D696 Thrombocytopenia, unspecified: Secondary | ICD-10-CM

## 2018-10-11 LAB — CBC
HCT: 26 % — ABNORMAL LOW (ref 36.0–46.0)
Hemoglobin: 7.9 g/dL — ABNORMAL LOW (ref 12.0–15.0)
MCH: 29.8 pg (ref 26.0–34.0)
MCHC: 30.4 g/dL (ref 30.0–36.0)
MCV: 98.1 fL (ref 80.0–100.0)
Platelets: 104 10*3/uL — ABNORMAL LOW (ref 150–400)
RBC: 2.65 MIL/uL — ABNORMAL LOW (ref 3.87–5.11)
RDW: 24.6 % — ABNORMAL HIGH (ref 11.5–15.5)
WBC: 4.3 10*3/uL (ref 4.0–10.5)
nRBC: 0 % (ref 0.0–0.2)

## 2018-10-11 LAB — BASIC METABOLIC PANEL
Anion gap: 11 (ref 5–15)
BUN: 96 mg/dL — AB (ref 8–23)
CALCIUM: 8.9 mg/dL (ref 8.9–10.3)
CO2: 34 mmol/L — ABNORMAL HIGH (ref 22–32)
CREATININE: 2.89 mg/dL — AB (ref 0.44–1.00)
Chloride: 91 mmol/L — ABNORMAL LOW (ref 98–111)
GFR calc Af Amer: 18 mL/min — ABNORMAL LOW (ref >60)
GFR calc non Af Amer: 15 mL/min — ABNORMAL LOW (ref >60)
Glucose, Bld: 103 mg/dL — ABNORMAL HIGH (ref 70–99)
Potassium: 3.6 mmol/L (ref 3.5–5.1)
Sodium: 136 mmol/L (ref 135–145)

## 2018-10-11 MED ORDER — POTASSIUM CHLORIDE CRYS ER 20 MEQ PO TBCR
40.0000 meq | EXTENDED_RELEASE_TABLET | Freq: Once | ORAL | Status: AC
  Administered 2018-10-11: 40 meq via ORAL
  Filled 2018-10-11: qty 2

## 2018-10-11 NOTE — Progress Notes (Addendum)
Advanced Heart Failure Rounding Note  PCP-Cardiologist: Dorris Carnes, MD   Subjective:    Received lasix 120 mg IV TID + 2.5 mg metolazone with good diuresis. Weight down 2 lbs overnight (9 lbs total). Creatinine improving 2.99 -> 2.89.   Hemoglobin 8.0 -> 7.9. Capsule study 1/15 normal. GI has signed off.   Had thoracentesis 1/15 with 1.7 L golden yellow fluid.    Denies SOB getting up to [facility]. Denies bleeding. Legs feel better.   Echo 10/07/18: EF 30-35% (down from 40-45% 05/2018). Mild AS, mod MR, RV normal function, mod TR,  PA peak pressure 59 mmHg.   Objective:   Weight Range: 113.3 kg Body mass index is 40.32 kg/m.   Vital Signs:   Temp:  [97.6 F (36.4 C)-97.8 F (36.6 C)] 97.7 F (36.5 C) (01/17 0614) Pulse Rate:  [89-103] 96 (01/17 0614) Resp:  [18-22] 18 (01/17 0614) BP: (108-127)/(53-71) 127/71 (01/17 0614) SpO2:  [94 %-100 %] 100 % (01/17 0614) Weight:  [113.3 kg] 113.3 kg (01/17 0614) Last BM Date: 10/10/18  Weight change: Filed Weights   10/09/18 0654 10/10/18 0552 10/11/18 0614  Weight: 116.1 kg 114.1 kg 113.3 kg    Intake/Output:   Intake/Output Summary (Last 24 hours) at 10/11/2018 0749 Last data filed at 10/11/2018 0618 Gross per 24 hour  Intake 1563.68 ml  Output 4300 ml  Net -2736.32 ml      Physical Exam    General:  No resp difficulty. HEENT: Normal Neck: Supple. JVP to jaw. Carotids 2+ bilat; no bruits. No thyromegaly or nodule noted. Cor: PMI nondisplaced. RRR, 2/6 TR and MR.  Lungs: CTAB, normal effort. Abdomen: Soft, non-tender, non-distended, no HSM. No bruits or masses. +BS  Extremities: No cyanosis, clubbing, or rash. R and LLE 1+ edema 1/2 up to knees.  Neuro: Alert & orientedx3, cranial nerves grossly intact. moves all 4 extremities w/o difficulty. Affect pleasant  Telemetry   Vpaced 90-100s, occ up to 120s. Personally reviewed.   EKG    No new tracings.   Labs    CBC Recent Labs    10/10/18 0610  10/11/18 0437  WBC 5.2 4.3  HGB 8.0* 7.9*  HCT 26.6* 26.0*  MCV 95.7 98.1  PLT 102* 130*   Basic Metabolic Panel Recent Labs    10/09/18 0514 10/10/18 0610 10/11/18 0437  NA 135 136 136  K 4.0 3.7 3.6  CL 96* 91* 91*  CO2 30 32 34*  GLUCOSE 104* 110* 103*  BUN 100* 97* 96*  CREATININE 2.99* 2.99* 2.89*  CALCIUM 8.8* 9.1 8.9  MG 2.4  --   --    Liver Function Tests No results for input(s): AST, ALT, ALKPHOS, BILITOT, PROT, ALBUMIN in the last 72 hours. No results for input(s): LIPASE, AMYLASE in the last 72 hours. Cardiac Enzymes No results for input(s): CKTOTAL, CKMB, CKMBINDEX, TROPONINI in the last 72 hours.  BNP: BNP (last 3 results) Recent Labs    06/20/18 2249 10/06/18 1309  BNP 160.6* 214.8*    ProBNP (last 3 results) No results for input(s): PROBNP in the last 8760 hours.   D-Dimer No results for input(s): DDIMER in the last 72 hours. Hemoglobin A1C No results for input(s): HGBA1C in the last 72 hours. Fasting Lipid Panel No results for input(s): CHOL, HDL, LDLCALC, TRIG, CHOLHDL, LDLDIRECT in the last 72 hours. Thyroid Function Tests No results for input(s): TSH, T4TOTAL, T3FREE, THYROIDAB in the last 72 hours.  Invalid input(s): FREET3  Other results:  Imaging    No results found.   Medications:     Scheduled Medications: . sodium chloride   Intravenous Once  . sodium chloride   Intravenous Once  . sodium chloride   Intravenous Once  . allopurinol  300 mg Oral QPM  . calcitRIOL  0.25 mcg Oral QODAY  . ferrous sulfate  325 mg Oral QHS  . HYDROcodone-acetaminophen  1 tablet Oral BID  . isosorbide mononitrate  15 mg Oral QHS  . lactose free nutrition  237 mL Oral TID WC  . metolazone  2.5 mg Oral Daily  . multivitamin with minerals  1 tablet Oral QHS  . pantoprazole  40 mg Oral BID  . polyethylene glycol  17 g Oral QHS  . potassium chloride  40 mEq Oral TID  . vitamin B-12  500 mcg Oral Daily    Infusions: . sodium chloride  Stopped (10/10/18 1650)  . furosemide 120 mg (10/11/18 0636)    PRN Medications: sodium chloride, acetaminophen, diphenhydrAMINE, docusate sodium, methocarbamol, ondansetron (ZOFRAN) IV, zolpidem    Patient Profile   [name] [name] a 73 y.o.femalewith a history of morbid obesity, afib s/p AV ablation andSt JudePPM, chronic combined HF, CKD IV, severe MR, TR, pulmonary HTN, OSA on BiPAP, GERD, gout, and iron deficiency anemia.Cardiac cath 2009 with normal coronary arteries.   Admitted with SOB and weight gain.   Assessment/Plan   1. Acute on chronic combined HF: Echo 06/21/18 with EF 40-45%, LV severely dilated, mild LVH, akinesis of mid-apicalanteroseptal and apical myocardium, mild AS, severe MR, LA and RA severely dilated, moderate TR, PA peak pressure 42 mmHg. Cause of cardiomyopathy uncertain. Could be valvular, could be due to chronic RV pacing (has not had coronary angiography that I can see and now with CKD stage IV). [place] 10/4 showed preserved cardiac output with markedly elevated right and left heart filling pressures (very prominent RV failure). TEE looked like restrictive cardiomyopathy. - Volume status improving. Still elevated. Weight down 9 lbs total (came in 20 lbs up). Continue 120 mg IV lasix TID + metolazone 2.5 mg x 1.  - She was taken off hydralazine due to soft BP. Continue imdur 15 mg daily  - Consider adding BB. Will discuss with MD. She still has a lot of fluid.  - No spiro/ARB/ACEI/ARNI with CKD stage IV.  - Refuses UNNA boots or TED hose.  - Echo 10/07/18: EF 30-35% (down from 40-45% 05/2018). Mild AS, mod MR, RV normal function, mod TR,  PA peak pressure 59 mmHg.  - Palliative consulted. She is now a DNR/DNI.   2. Mitral regurgitation: Severe MR on echo9/27/19 (mild on echo 10/2016). TEE on 10/4 showed moderate-severe central MR with some restriction of posterior leaflet. Tricuspid regurgitation was more impressive.  - She was evaluated by Dr  Lorenza Chick 10/16 for possible mitral clip. There was concern mitral clip may not make a difference due to multiple co-morbidities including RV failure with severe TR. - continue medical therapy. MR moderate on echo this admit. No change.  3. Afib s/p AV nodal ablation and PPM: Chronic RV pacing in the 90-100s. - With dyssynchrony and decreased LV EF, could consider CRT upgrade at some point but doubt that will improve current situation - She refuses anticoagulation ("caused my teeth to fall out and my hair to fall out"). She will only take ASA. ASA held with anemia. No change.  4. Anemia/Recent GIB. - Hemoglobin 7.9 today - GI consulted last admission. She had an EGD that  showed erosive gastritis. PPI increased.  - Had capsule study 1/15, which was normal. GI has signed off.  - LDH 447. ?hemolysis off valves. 5. OSA: Continue BiPAP qHS. No change.  6. CKD Stage 4: Baseline creatinine around 2.5. - Creatinine 2.94-> 3.03 -> 2.99 -> 2.99 -> 2.89 today. Monitor closely. Follows with Nephrology outpatient. 7. Tricuspid regurgitation: -Severe TR on TEE 05/2018. Moderate TR on echo this admit (severe per Dr Haroldine Laws). No change.  8. Mild aortic stenosis - Mild on echo this admit. No change.  9. Hypokalemia - K 3.6. Supp.  10. Right pleural effusion - S/p thoracentesis 1/16 with 1.7 L golden yellow fluid off. Nothing sent for testing. No change.   Medication concerns reviewed with patient and pharmacy team. Barriers identified: none at this time.   Length of Stay: [place], NP  10/11/2018, 7:49 AM  Advanced Heart Failure Team Pager [phone] (M-F; 7a - 4p)  Please contact [facility] for night-coverage after hours (4p -7a ) and weekends on [email or web address]   Patient seen and examined with the above-signed Advanced Practice Provider and/or Housestaff. I personally reviewed laboratory data, imaging studies and relevant notes. I independently examined the patient and formulated the  important aspects of the plan. I have edited the note to reflect any of my changes or salient points. I have personally discussed the plan with the patient and/or family.  Continues to improve with IV diuresis. Renal function improving slowly. K low. Will supp. Will continue IV diuresis and metolazone at least 1-2 more days as renal function tolerates.  Glori Bickers, MD  8:52 AM

## 2018-10-11 NOTE — Care Management Note (Signed)
Case Management Note  Patient Details  Name: MIKELLA LINSLEY MRN: [id number] Date of Birth: 15-Jun-1946  Subjective/Objective:     HTN CV Renal Disease              Action/Plan: Patient lives with her daughter; PCP is Dorthy Cooler, [name], MD; has private insurance with Medicare; pharmacy of choice is [facility] in [place]; DME - wheelchair; very supportive daughter - takes her to apts, errands and shopping; Patient could benefit from [facility] but she is refusing all [place] at this time; CM will continue to follow for progression of care.  Expected Discharge Date:  Possibly 10/14/2018            Expected Discharge Plan:  [place]  Discharge planning Services  CM Consult Choice offered to:  Patient  HH Arranged:  Patient Refused HH  Status of Service:  In process, will continue to follow  Sherrilyn Rist [phone] 10/11/2018, 1:29 PM

## 2018-10-11 NOTE — Progress Notes (Signed)
PROGRESS NOTE    [name] [name]  FHL:[id number] DOB: Aug 17, 1946 DOA: 10/06/2018 PCP: [name], MD   Brief Narrative: Patient is a 73 year old female with history of combined systolic/diastolic CHF, A. fib, CKD stage III-4, liver cirrhosis secondary to [place], status post placement of placement after AV node ablation, severe MR, TR, pulmonary hypertension, OSA on CPAP, iron deficiency anemia who presented with increased shortness of breath.  Admitted for CHF exacerbation.  She reported gaining more than 10 pounds.  Also found to be anemic and an acute kidney injury on CKD.  Cardiology, GI following.  Underwent VCE  and also left-sided thoracentesis with removal of 1.7 liters of fluid on 10/09/18.  Plan is to continue IV diuresis until optimization of CHF status.  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 or unspecified chronic kidney disease, with heart failure (HCC) Active Problems:   CKD stage G3b/A1, GFR 30-44 and albumin creatinine ratio <30 mg/g (HCC)   Acute on chronic combined systolic and diastolic CHF (congestive heart failure) (HCC)   Symptomatic anemia   Acute renal failure superimposed on stage 4 chronic kidney disease (HCC)   Hypokalemia   Hyponatremia   Thrombocytopenia (HCC)   Acute on chronic combined systolic and diastolic congestive heart failure, NYHA class 4 (HCC)   Acute on chronic systolic and diastolic heart failure, NYHA class 4 (HCC)   Goals of care, counseling/discussion   Palliative care by specialist   DNR (do not resuscitate) discussion  Acute on chronic combined systolic/diastolic CHF: Currently on Lasix 120 mg TID.  Also on metolazone.  Continue diuresis.  Has been having good diuresis.Continue to monitor input/output. On Imdur.  Not on ACE/ARB or Aldactone due to advanced kidney disease.  Heart failure team following. Echocardiogram done on 10/07/2018 showed ejection fraction of 30 to 35% mid to  distal anterior, apical and mid to distal inferior hypokinesis.  Acute on chronic anemia with history of recent GI bleed: Status post 2 units of blood transfusion on 10/08/2018 after her hemoglobin dropped to 6.1.  This morning 7.9.  GI was following.  EGD done last hospitalization showed erosive gastritis.  Continue PPI.  Patient declines colonoscopy.  Underwent video capsule endoscopy on 10/09/17 and found to negative for bleeding.  Her anemia could be associated with her chronic kidney disease.Will check iron studies.  Acute on chronic kidney disease stage III-IV:  Cardiorenal syndrome.Creatinine today 2.89.  Heart failure team following.  Continue diuresis.  Avoid nephrotoxic agents.  Follows with Dr. Jimmy Footman as an outpatient.Texted him about her presence here.  Right-sided pleural effusion:Secondary to CHF exacerbation.  Underwent ultrasound-guided thoracentesis with goal of 1.7 L of fluid on 10/09/2018  Hypokalemia: Continue daily replacement.  Chronic A. fib with history of AV node ablation/PPM: Has refused anticoagulation in the past.  Aspirin also on hold due to severe anemia.  Currently rate is controlled  Hyponatremia:Secondary  to CHF.Resolved  Severe mitral regurgitation: Follows with Dr. Burt Knack.  Was considered for possible mitral clip.  OSA: Continue CPAP  Thrombocytopenia: Chronic.  Continue to monitor.  Deconditioning/debility/multiple comorbidities/poor prognosis: Palliative Care following.  She is DNR.         DVT prophylaxis:SCD Code Status: DNR Family Communication: None present at the bedside Disposition Plan: She is from home.  Anticipated discharge to home when she is medically stable.   Consultants: Heart failure, GI  Procedures: Video capsule endoscopy, thoracentesis  Antimicrobials:None  Subjective: Patient seen and examined the bedside this morning.  Hemodynamically stable.Looks more comfortable today. Dyspnea much  Better today .  She Still has  severe peripheral edema.  Having good diuresis.  Objective: Vitals:   10/10/18 1218 10/10/18 1700 10/10/18 1929 10/11/18 0614  BP: 108/67  (!) 118/53 127/71  Pulse: (!) 103 97 89 96  Resp: (!) 22  18 18   Temp: 97.6 F (36.4 C)  97.8 F (36.6 C) 97.7 F (36.5 C)  TempSrc: Oral  Oral Oral  SpO2: 94% 97% 97% 100%  Weight:    113.3 kg  Height:        Intake/Output Summary (Last 24 hours) at 10/11/2018 1118 Last data filed at 10/11/2018 1112 Gross per 24 hour  Intake 1260.25 ml  Output 4200 ml  Net -2939.75 ml   Filed Weights   10/09/18 0654 10/10/18 0552 10/11/18 0614  Weight: 116.1 kg 114.1 kg 113.3 kg    Examination:    General exam: Debilitated, generalized weakness, obese  HEENT:PERRL,Oral mucosa moist, Ear/Nose normal on gross exam Respiratory system: Bilateral decreased air entry in the bases Cardiovascular system: S1 & S2 heard, RRR. No JVD, murmurs, rubs, gallops or clicks.  Severe bilateral pedal edema Gastrointestinal system: Abdomen is nondistended, soft and nontender. No organomegaly or masses felt. Normal bowel sounds heard. Central nervous system: Alert and oriented. No focal neurological deficits. Extremities: Severe bilateral lower extremity edema, no clubbing ,no cyanosis, distal peripheral pulses palpable. Skin: Dark discoloration of bilateral lower extremities most likely secondary to chronic venous stasis, No lesions or ulcers,no icterus ,no pallor    Data Reviewed: I have personally reviewed following labs and imaging studies  CBC: Recent Labs  Lab 10/06/18 1309 10/07/18 0959 10/08/18 0425 10/09/18 0514 10/10/18 0610 10/11/18 0437  WBC 5.2 4.8 4.9 5.5 5.2 4.3  NEUTROABS 3.7  --   --   --   --   --   HGB 7.2* 6.7* 6.1* 7.3* 8.0* 7.9*  HCT 23.6* 21.6* 20.1* 23.6* 26.6* 26.0*  MCV 98.3 98.2 99.5 95.2 95.7 98.1  PLT 105* 97* 95* 94* 102* 259*   Basic Metabolic Panel: Recent Labs  Lab 10/07/18 1337 10/08/18 0425 10/09/18 0514  10/10/18 0610 10/11/18 0437  NA 132* 133* 135 136 136  K 3.2* 3.2* 4.0 3.7 3.6  CL 90* 91* 96* 91* 91*  CO2 29 31 30  32 34*  GLUCOSE 169* 109* 104* 110* 103*  BUN 98* 101* 100* 97* 96*  CREATININE 3.06* 3.03* 2.99* 2.99* 2.89*  CALCIUM 9.2 9.0 8.8* 9.1 8.9  MG  --   --  2.4  --   --    GFR: Estimated Creatinine Clearance: 22.1 mL/min (A) (by C-G formula based on SCr of 2.89 mg/dL (H)). Liver Function Tests: Recent Labs  Lab 10/06/18 1309  AST 33  ALT 22  ALKPHOS 49  BILITOT 1.9*  PROT 6.3*  ALBUMIN 3.5   No results for input(s): LIPASE, AMYLASE in the last 168 hours. No results for input(s): AMMONIA in the last 168 hours. Coagulation Profile: No results for input(s): INR, PROTIME in the last 168 hours. Cardiac Enzymes: No results for input(s): CKTOTAL, CKMB, CKMBINDEX, TROPONINI in the last 168 hours. BNP (last 3 results) No results for input(s): PROBNP in the last 8760 hours. HbA1C: No results for input(s): HGBA1C in the last 72 hours. CBG: No results for input(s): GLUCAP in the last 168 hours. Lipid Profile: No results for input(s): CHOL, HDL, LDLCALC, TRIG, CHOLHDL, LDLDIRECT in the last 72 hours. Thyroid Function Tests: No results for input(s):  TSH, T4TOTAL, FREET4, T3FREE, THYROIDAB in the last 72 hours. Anemia Panel: No results for input(s): VITAMINB12, FOLATE, FERRITIN, TIBC, IRON, RETICCTPCT in the last 72 hours. Sepsis Labs: No results for input(s): PROCALCITON, LATICACIDVEN in the last 168 hours.  Recent Results (from the past 240 hour(s))  Urine culture     Status: Abnormal   Collection Time: 10/06/18 12:56 PM  Result Value Ref Range Status   Specimen Description URINE, RANDOM  Final   Special Requests NONE  Final   Culture (A)  Final    <10,000 COLONIES/mL INSIGNIFICANT GROWTH Performed at [facility], [address], [place], [place] [address]    Report Status 10/07/2018 FINAL  Final         Radiology Studies: Dg Chest 1  View  Result Date: 10/09/2018 CLINICAL DATA:  Status post right thoracentesis today. EXAM: CHEST  1 VIEW COMPARISON:  PA and lateral chest 10/08/2018. FINDINGS: Right pleural effusion seen on the prior examination is markedly decreased after thoracentesis. No pneumothorax. Lungs are clear. Massive cardiomegaly is unchanged. IMPRESSION: Marked decrease in right pleural effusion after thoracentesis. Negative for pneumothorax. Cardiomegaly. Electronically Signed   By: Inge Rise M.D.   On: 10/09/2018 12:01   Ir Thoracentesis Asp Pleural Space W/img Guide  Result Date: 10/09/2018 INDICATION: Patient with history of CHD, a.fib, severe MR, OSA, CKD, NASH liver cirrhosis recently admitted for CHF exacerbation - noted to have right sided pleural effusion on CXR. Request for therapeutic thoracentesis today in IR. EXAM: ULTRASOUND GUIDED RIGHT THORACENTESIS MEDICATIONS: 10 mL 1% lidocaine COMPLICATIONS: None immediate. PROCEDURE: An ultrasound guided thoracentesis was thoroughly discussed with the patient and questions answered. The benefits, risks, alternatives and complications were also discussed. The patient understands and wishes to proceed with the procedure. Written consent was obtained. Ultrasound was performed to localize and mark an adequate pocket of fluid in the right chest. The area was then prepped and draped in the normal sterile fashion. 1% Lidocaine was used for local anesthesia. Under ultrasound guidance a 6 Fr Safe-T-Centesis catheter was introduced. Thoracentesis was performed. The catheter was removed and a dressing applied. FINDINGS: A total of approximately 1.7 L of golden yellow fluid was removed. IMPRESSION: Successful ultrasound guided right thoracentesis yielding 1.7 L of pleural fluid. Read by Candiss Norse, PA-C Electronically Signed   By: Aletta Edouard M.D.   On: 10/09/2018 13:34        Scheduled Meds: . allopurinol  300 mg Oral QPM  . calcitRIOL  0.25 mcg Oral QODAY   . ferrous sulfate  325 mg Oral QHS  . HYDROcodone-acetaminophen  1 tablet Oral BID  . isosorbide mononitrate  15 mg Oral QHS  . lactose free nutrition  237 mL Oral TID WC  . metolazone  2.5 mg Oral Daily  . multivitamin with minerals  1 tablet Oral QHS  . pantoprazole  40 mg Oral BID  . polyethylene glycol  17 g Oral QHS  . potassium chloride  40 mEq Oral TID  . vitamin B-12  500 mcg Oral Daily   Continuous Infusions: . sodium chloride Stopped (10/10/18 1650)  . furosemide 120 mg (10/11/18 0636)     LOS: 4 days    Time spent: 25 mins.More than 50% of that time was spent in counseling and/or coordination of care.      Shelly Coss, MD [facility] Pager [phone]  If 7PM-7AM, please contact night-coverage [email or web address] Password [email or web address] 10/11/2018, 11:18 AM

## 2018-10-11 NOTE — Progress Notes (Signed)
RT NOTE:  Pt has home BIPAP unit @ bedside and manages

## 2018-10-12 LAB — CBC
HCT: 26.2 % — ABNORMAL LOW (ref 36.0–46.0)
Hemoglobin: 7.9 g/dL — ABNORMAL LOW (ref 12.0–15.0)
MCH: 29.6 pg (ref 26.0–34.0)
MCHC: 30.2 g/dL (ref 30.0–36.0)
MCV: 98.1 fL (ref 80.0–100.0)
PLATELETS: 123 10*3/uL — AB (ref 150–400)
RBC: 2.67 MIL/uL — ABNORMAL LOW (ref 3.87–5.11)
RDW: 24.3 % — AB (ref 11.5–15.5)
WBC: 3.9 10*3/uL — ABNORMAL LOW (ref 4.0–10.5)
nRBC: 0 % (ref 0.0–0.2)

## 2018-10-12 LAB — IRON AND TIBC
Iron: 41 ug/dL (ref 28–170)
Saturation Ratios: 11 % (ref 10.4–31.8)
TIBC: 361 ug/dL (ref 250–450)
UIBC: 320 ug/dL

## 2018-10-12 LAB — BASIC METABOLIC PANEL
Anion gap: 12 (ref 5–15)
BUN: 92 mg/dL — ABNORMAL HIGH (ref 8–23)
CALCIUM: 9 mg/dL (ref 8.9–10.3)
CO2: 33 mmol/L — ABNORMAL HIGH (ref 22–32)
Chloride: 91 mmol/L — ABNORMAL LOW (ref 98–111)
Creatinine, Ser: 2.74 mg/dL — ABNORMAL HIGH (ref 0.44–1.00)
GFR calc Af Amer: 19 mL/min — ABNORMAL LOW (ref >60)
GFR calc non Af Amer: 17 mL/min — ABNORMAL LOW (ref >60)
Glucose, Bld: 95 mg/dL (ref 70–99)
Potassium: 4.1 mmol/L (ref 3.5–5.1)
Sodium: 136 mmol/L (ref 135–145)

## 2018-10-12 LAB — FOLATE: Folate: 32.6 ng/mL (ref >5.9)

## 2018-10-12 LAB — RETICULOCYTES
Immature Retic Fract: 17.8 % — ABNORMAL HIGH (ref 2.3–15.9)
RBC.: 2.67 MIL/uL — ABNORMAL LOW (ref 3.87–5.11)
RETIC CT PCT: 6 % — AB (ref 0.4–3.1)
Retic Count, Absolute: 160.7 10*3/uL (ref 19.0–186.0)

## 2018-10-12 LAB — FERRITIN: Ferritin: 146 ng/mL (ref 11–307)

## 2018-10-12 LAB — VITAMIN B12: Vitamin B-12: 2717 pg/mL — ABNORMAL HIGH (ref 180–914)

## 2018-10-12 NOTE — Progress Notes (Signed)
PROGRESS NOTE    [name] [name]  FTD:[id number] DOB: Jan 14, 1946 DOA: 10/06/2018 PCP: [name], MD   Brief Narrative: Patient is a 73 year old female with history of combined systolic/diastolic CHF, A. fib, CKD stage III-4, liver cirrhosis secondary to [place], status post placement of placement after AV node ablation, severe MR, TR, pulmonary hypertension, OSA on CPAP, iron deficiency anemia who presented with increased shortness of breath.  Admitted for CHF exacerbation.  She reported gaining more than 10 pounds.  Also found to be anemic and an acute kidney injury on CKD.  Cardiology, GI following.  Underwent VCE  and also left-sided thoracentesis with removal of 1.7 liters of fluid on 10/09/18.  Plan is to continue IV diuresis until optimization of CHF status.  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 or unspecified chronic kidney disease, with heart failure (HCC) Active Problems:   CKD stage G3b/A1, GFR 30-44 and albumin creatinine ratio <30 mg/g (HCC)   Acute on chronic combined systolic and diastolic CHF (congestive heart failure) (HCC)   Symptomatic anemia   Acute renal failure superimposed on stage 4 chronic kidney disease (HCC)   Hypokalemia   Hyponatremia   Thrombocytopenia (HCC)   Acute on chronic combined systolic and diastolic congestive heart failure, NYHA class 4 (HCC)   Acute on chronic systolic and diastolic heart failure, NYHA class 4 (HCC)   Goals of care, counseling/discussion   Palliative care by specialist   DNR (do not resuscitate) discussion  Acute on chronic combined systolic/diastolic CHF: Currently on Lasix 120 mg TID.  Also on metolazone.  Continue diuresis.  Has been having good diuresis.Continue to monitor input/output. On Imdur.  Not on ACE/ARB or Aldactone due to advanced kidney disease.  Heart failure team following. Echocardiogram done on 10/07/2018 showed ejection fraction of 30 to 35% mid to  distal anterior, apical and mid to distal inferior hypokinesis.  Acute on chronic anemia with history of recent GI bleed: Status post 2 units of blood transfusion on 10/08/2018 after her hemoglobin dropped to 6.1.  This morning 7.9.  GI was following.  EGD done last hospitalization showed erosive gastritis.  Continue PPI.  Patient declines colonoscopy.  Underwent video capsule endoscopy on 10/09/17 and found to negative for bleeding.  Her anemia could be associated with her chronic kidney disease.  Acute on chronic kidney disease stage III-IV:  Cardiorenal syndrome.Creatinine today 2.74.  Heart failure team following.  Continue diuresis.  Avoid nephrotoxic agents.  Follows with Dr. Jimmy Footman as an outpatient.  Right-sided pleural effusion:Secondary to CHF exacerbation.  Underwent ultrasound-guided thoracentesis with goal of 1.7 L of fluid on 10/09/2018  Hypokalemia: Continue daily replacement.  Chronic A. fib with history of AV node ablation/PPM: Has refused anticoagulation in the past.  Aspirin also on hold due to severe anemia.  Currently rate is controlled  Hyponatremia:Secondary  to CHF.Resolved  Severe mitral regurgitation: Follows with Dr. Burt Knack.  Was considered for possible mitral clip.  OSA: Continue CPAP  Thrombocytopenia: Chronic.  Continue to monitor.  Deconditioning/debility/multiple comorbidities/poor prognosis: Palliative Care following.  She is DNR.         DVT prophylaxis:SCD Code Status: DNR Family Communication: None present at the bedside Disposition Plan: She is from home.  Anticipated discharge to home when she is medically stable.   Consultants: Heart failure, GI  Procedures: Video capsule endoscopy, thoracentesis  Antimicrobials:None  Subjective: Patient seen and examined the bedside this morning.    Hemodynamically stable.Looks more comfortable today.  Dyspnea much  better today .  She has improving lower extremity edema.  Having good diuresis. Founds to  be sitting on the chair and eating breakfast today.  Objective: Vitals:   10/11/18 1952 10/12/18 0552 10/12/18 0607 10/12/18 0849  BP: (!) 106/54 126/75  126/75  Pulse: (!) 101 97  99  Resp: 18 18    Temp: 97.9 F (36.6 C) 97.9 F (36.6 C)    TempSrc: Oral Oral    SpO2: 97% 100%    Weight:   112.5 kg   Height:        Intake/Output Summary (Last 24 hours) at 10/12/2018 0915 Last data filed at 10/12/2018 0558 Gross per 24 hour  Intake 1074.8 ml  Output 4450 ml  Net -3375.2 ml   Filed Weights   10/10/18 0552 10/11/18 0614 10/12/18 0607  Weight: 114.1 kg 113.3 kg 112.5 kg    Examination:  General exam: Obese  HEENT:PERRL,Oral mucosa moist, Ear/Nose normal on gross exam Respiratory system: Bilateral decreased air entry in the bases Cardiovascular system: S1 & S2 heard, RRR. Elevated  JVD, No murmurs, rubs, gallops or clicks. Gastrointestinal system: Abdomen is nondistended, soft and nontender. No organomegaly or masses felt. Normal bowel sounds heard. Central nervous system: Alert and oriented. No focal neurological deficits. Extremities: 2-3 + bilateral lower extremity edema, no clubbing ,no cyanosis, distal peripheral pulses palpable. Skin: Dark discoloration of bilateral lower extremities most likely secondary to chronic venous stasis, No lesions or ulcers,no icterus ,no pallor    Data Reviewed: I have personally reviewed following labs and imaging studies  CBC: Recent Labs  Lab 10/06/18 1309  10/08/18 0425 10/09/18 0514 10/10/18 0610 10/11/18 0437 10/12/18 0541  WBC 5.2   < > 4.9 5.5 5.2 4.3 3.9*  NEUTROABS 3.7  --   --   --   --   --   --   HGB 7.2*   < > 6.1* 7.3* 8.0* 7.9* 7.9*  HCT 23.6*   < > 20.1* 23.6* 26.6* 26.0* 26.2*  MCV 98.3   < > 99.5 95.2 95.7 98.1 98.1  PLT 105*   < > 95* 94* 102* 104* 123*   < > = values in this interval not displayed.   Basic Metabolic Panel: Recent Labs  Lab 10/08/18 0425 10/09/18 0514 10/10/18 0610 10/11/18 0437  10/12/18 0541  NA 133* 135 136 136 136  K 3.2* 4.0 3.7 3.6 4.1  CL 91* 96* 91* 91* 91*  CO2 31 30 32 34* 33*  GLUCOSE 109* 104* 110* 103* 95  BUN 101* 100* 97* 96* 92*  CREATININE 3.03* 2.99* 2.99* 2.89* 2.74*  CALCIUM 9.0 8.8* 9.1 8.9 9.0  MG  --  2.4  --   --   --    GFR: Estimated Creatinine Clearance: 23.3 mL/min (A) (by C-G formula based on SCr of 2.74 mg/dL (H)). Liver Function Tests: Recent Labs  Lab 10/06/18 1309  AST 33  ALT 22  ALKPHOS 49  BILITOT 1.9*  PROT 6.3*  ALBUMIN 3.5   No results for input(s): LIPASE, AMYLASE in the last 168 hours. No results for input(s): AMMONIA in the last 168 hours. Coagulation Profile: No results for input(s): INR, PROTIME in the last 168 hours. Cardiac Enzymes: No results for input(s): CKTOTAL, CKMB, CKMBINDEX, TROPONINI in the last 168 hours. BNP (last 3 results) No results for input(s): PROBNP in the last 8760 hours. HbA1C: No results for input(s): HGBA1C in the last 72 hours. CBG: No results for input(s):  GLUCAP in the last 168 hours. Lipid Profile: No results for input(s): CHOL, HDL, LDLCALC, TRIG, CHOLHDL, LDLDIRECT in the last 72 hours. Thyroid Function Tests: No results for input(s): TSH, T4TOTAL, FREET4, T3FREE, THYROIDAB in the last 72 hours. Anemia Panel: Recent Labs    10/12/18 0541  RETICCTPCT 6.0*   Sepsis Labs: No results for input(s): PROCALCITON, LATICACIDVEN in the last 168 hours.  Recent Results (from the past 240 hour(s))  Urine culture     Status: Abnormal   Collection Time: 10/06/18 12:56 PM  Result Value Ref Range Status   Specimen Description URINE, RANDOM  Final   Special Requests NONE  Final   Culture (A)  Final    <10,000 COLONIES/mL INSIGNIFICANT GROWTH Performed at [facility], [address] [place], [place] [address]    Report Status 10/07/2018 FINAL  Final         Radiology Studies: No results found.      Scheduled Meds: . allopurinol  300 mg Oral QPM  .  calcitRIOL  0.25 mcg Oral QODAY  . ferrous sulfate  325 mg Oral QHS  . HYDROcodone-acetaminophen  1 tablet Oral BID  . isosorbide mononitrate  15 mg Oral QHS  . lactose free nutrition  237 mL Oral TID WC  . metolazone  2.5 mg Oral Daily  . multivitamin with minerals  1 tablet Oral QHS  . pantoprazole  40 mg Oral BID  . polyethylene glycol  17 g Oral QHS  . potassium chloride  40 mEq Oral TID  . vitamin B-12  500 mcg Oral Daily   Continuous Infusions: . sodium chloride Stopped (10/10/18 1650)  . furosemide 120 mg (10/12/18 0556)     LOS: 5 days    Time spent: 25 mins.More than 50% of that time was spent in counseling and/or coordination of care.      Shelly Coss, MD [facility] Pager [phone]  If 7PM-7AM, please contact night-coverage [email or web address] Password [email or web address] 10/12/2018, 9:15 AM

## 2018-10-12 NOTE — Progress Notes (Signed)
Advanced Heart Failure Rounding Note  PCP-Cardiologist: Dorris Carnes, MD   Subjective:    Continues to diurese on IV lasix and metolazone. Weight down another 2 pounds. Creatinine 2.89 -> 2.74. Hgb stable at 7.9 today.    Studies:  Echo 10/07/18: EF 30-35% (down from 40-45% 05/2018). Mild AS, mod MR, RV normal function, mod TR,  PA peak pressure 59 mmHg.  Capsule study 1/15 normal. GI has signed off.  R thoracentesis 1/15 with 1.7 L golden yellow fluid.    Objective:   Weight Range: 112.5 kg Body mass index is 40.04 kg/m.   Vital Signs:   Temp:  [97.6 F (36.4 C)-97.9 F (36.6 C)] 97.9 F (36.6 C) (01/18 0552) Pulse Rate:  [97-101] 99 (01/18 0849) Resp:  [18-20] 18 (01/18 0552) BP: (106-126)/(54-75) 126/75 (01/18 0849) SpO2:  [95 %-100 %] 100 % (01/18 0552) Weight:  [112.5 kg] 112.5 kg (01/18 0607) Last BM Date: 10/11/18  Weight change: Filed Weights   10/10/18 0552 10/11/18 0614 10/12/18 0607  Weight: 114.1 kg 113.3 kg 112.5 kg    Intake/Output:   Intake/Output Summary (Last 24 hours) at 10/12/2018 1007 Last data filed at 10/12/2018 0558 Gross per 24 hour  Intake 1074.8 ml  Output 3650 ml  Net -2575.2 ml      Physical Exam    General:  Sitting up on side of bed No resp difficulty HEENT: normal Neck: supple. JVP to jaw  Carotids 2+ bilat; no bruits. No lymphadenopathy or thryomegaly appreciated. Cor: PMI nondisplaced. Regular rate & rhythm. 2/6 TR/MR Lungs: clear Abdomen: obese soft, nontender, nondistended. No hepatosplenomegaly. No bruits or masses. Good bowel sounds. Extremities: no cyanosis, clubbing, rash,2+  edema Neuro: alert & orientedx3, cranial nerves grossly intact. moves all 4 extremities w/o difficulty. Affect pleasant   Telemetry   Vpaced 90-100s, occ up to 120s. Personally reviewed.   EKG    No new tracings.   Labs    CBC Recent Labs    10/11/18 0437 10/12/18 0541  WBC 4.3 3.9*  HGB 7.9* 7.9*  HCT 26.0* 26.2*  MCV 98.1 98.1   PLT 104* 861*   Basic Metabolic Panel Recent Labs    10/11/18 0437 10/12/18 0541  NA 136 136  K 3.6 4.1  CL 91* 91*  CO2 34* 33*  GLUCOSE 103* 95  BUN 96* 92*  CREATININE 2.89* 2.74*  CALCIUM 8.9 9.0   Liver Function Tests No results for input(s): AST, ALT, ALKPHOS, BILITOT, PROT, ALBUMIN in the last 72 hours. No results for input(s): LIPASE, AMYLASE in the last 72 hours. Cardiac Enzymes No results for input(s): CKTOTAL, CKMB, CKMBINDEX, TROPONINI in the last 72 hours.  BNP: BNP (last 3 results) Recent Labs    06/20/18 2249 10/06/18 1309  BNP 160.6* 214.8*    ProBNP (last 3 results) No results for input(s): PROBNP in the last 8760 hours.   D-Dimer No results for input(s): DDIMER in the last 72 hours. Hemoglobin A1C No results for input(s): HGBA1C in the last 72 hours. Fasting Lipid Panel No results for input(s): CHOL, HDL, LDLCALC, TRIG, CHOLHDL, LDLDIRECT in the last 72 hours. Thyroid Function Tests No results for input(s): TSH, T4TOTAL, T3FREE, THYROIDAB in the last 72 hours.  Invalid input(s): FREET3  Other results:   Imaging    No results found.   Medications:     Scheduled Medications: . allopurinol  300 mg Oral QPM  . calcitRIOL  0.25 mcg Oral QODAY  . ferrous sulfate  325 mg  Oral QHS  . HYDROcodone-acetaminophen  1 tablet Oral BID  . isosorbide mononitrate  15 mg Oral QHS  . lactose free nutrition  237 mL Oral TID WC  . metolazone  2.5 mg Oral Daily  . multivitamin with minerals  1 tablet Oral QHS  . pantoprazole  40 mg Oral BID  . polyethylene glycol  17 g Oral QHS  . potassium chloride  40 mEq Oral TID  . vitamin B-12  500 mcg Oral Daily    Infusions: . sodium chloride Stopped (10/10/18 1650)  . furosemide 120 mg (10/12/18 0556)    PRN Medications: sodium chloride, acetaminophen, diphenhydrAMINE, docusate sodium, methocarbamol, ondansetron (ZOFRAN) IV, zolpidem    Patient Profile   [name] [name] a 73  y.o.femalewith a history of morbid obesity, afib s/p AV ablation andSt JudePPM, chronic combined HF, CKD IV, severe MR, TR, pulmonary HTN, OSA on BiPAP, GERD, gout, and iron deficiency anemia.Cardiac cath 2009 with normal coronary arteries.   Admitted with SOB and weight gain.   Assessment/Plan   1. Acute on chronic combined HF: Echo 06/21/18 with EF 40-45%, LV severely dilated, mild LVH, akinesis of mid-apicalanteroseptal and apical myocardium, mild AS, severe MR, LA and RA severely dilated, moderate TR, PA peak pressure 42 mmHg. Cause of cardiomyopathy uncertain. Could be valvular, could be due to chronic RV pacing (has not had coronary angiography that I can see and now with CKD stage IV). [place] 10/4 showed preserved cardiac output with markedly elevated right and left heart filling pressures (very prominent RV failure). TEE looked like restrictive cardiomyopathy. - Volume status improving. Still elevated. Weight down11 lbs total (came in 20 lbs up). Renal function stable. Continue 120 mg IV lasix TID + metolazone 2.5 mg x 1.  - She was taken off hydralazine due to soft BP. Continue imdur 15 mg daily  - No bblocker yet  - No spiro/ARB/ACEI/ARNI with CKD stage IV.  - Refuses UNNA boots or TED hose.  - Echo 10/07/18: EF 30-35% (down from 40-45% 05/2018). Mild AS, mod MR, RV normal function, mod TR,  PA peak pressure 59 mmHg.  - Palliative consulted. She is now a DNR/DNI.   2. Mitral regurgitation: Severe MR on echo9/27/19 (mild on echo 10/2016). TEE on 10/4 showed moderate-severe central MR with some restriction of posterior leaflet. Tricuspid regurgitation was more impressive.  - She was evaluated by Dr Lorenza Chick 10/16 for possible mitral clip. There was concern mitral clip may not make a difference due to multiple co-morbidities including RV failure with severe TR. - continue medical therapy. MR moderate on echo this admit. No change.  3. Afib s/p AV nodal ablation and PPM: Chronic RV  pacing in the 90-100s. - With dyssynchrony and decreased LV EF, could consider CRT upgrade at some point but doubt that will improve current situation - She refuses anticoagulation ("caused my teeth to fall out and my hair to fall out"). She will only take ASA. ASA held with anemia. No change.  4. Anemia/Recent GIB. - Hemoglobin stable at 7.9 today - GI consulted last admission. She had an EGD that showed erosive gastritis. PPI increased.  - Had capsule study 1/15, which was normal. GI has signed off.  - LDH 447. ?hemolysis off valves. 5. OSA: Continue BiPAP qHS. No change.  6. CKD Stage 4: Baseline creatinine around 2.5. - Creatinine 2.94-> 3.03 -> 2.99 -> 2.99 -> 2.89 -<> 2.74 today. Monitor closely. Follows with Nephrology outpatient. 7. Tricuspid regurgitation: -Severe TR on TEE 05/2018. Moderate  TR on echo this admit (severe per Dr Haroldine Laws). No change.  8. Mild aortic stenosis - Mild on echo this admit. No change.  9. Hypokalemia - K4.1 today 10. Right pleural effusion - S/p thoracentesis 1/16 with 1.7 L golden yellow fluid off. Nothing sent for testing. No change.   Medication concerns reviewed with patient and pharmacy team. Barriers identified: none at this time.   Length of Stay: [place], MD  10/12/2018, 10:07 AM  Advanced Heart Failure Team Pager [phone] (M-F; [place])  Please contact [facility] for night-coverage after hours (4p -7a ) and weekends on [email or web address]

## 2018-10-13 LAB — BASIC METABOLIC PANEL
Anion gap: 12 (ref 5–15)
BUN: 90 mg/dL — ABNORMAL HIGH (ref 8–23)
CALCIUM: 9 mg/dL (ref 8.9–10.3)
CO2: 33 mmol/L — ABNORMAL HIGH (ref 22–32)
Chloride: 92 mmol/L — ABNORMAL LOW (ref 98–111)
Creatinine, Ser: 2.99 mg/dL — ABNORMAL HIGH (ref 0.44–1.00)
GFR calc Af Amer: 17 mL/min — ABNORMAL LOW (ref >60)
GFR, EST NON AFRICAN AMERICAN: 15 mL/min — AB (ref >60)
Glucose, Bld: 98 mg/dL (ref 70–99)
Potassium: 4.2 mmol/L (ref 3.5–5.1)
Sodium: 137 mmol/L (ref 135–145)

## 2018-10-13 LAB — CBC
HCT: 26.4 % — ABNORMAL LOW (ref 36.0–46.0)
Hemoglobin: 8 g/dL — ABNORMAL LOW (ref 12.0–15.0)
MCH: 29.4 pg (ref 26.0–34.0)
MCHC: 30.3 g/dL (ref 30.0–36.0)
MCV: 97.1 fL (ref 80.0–100.0)
Platelets: 123 10*3/uL — ABNORMAL LOW (ref 150–400)
RBC: 2.72 MIL/uL — ABNORMAL LOW (ref 3.87–5.11)
RDW: 23.7 % — ABNORMAL HIGH (ref 11.5–15.5)
WBC: 4 10*3/uL (ref 4.0–10.5)
nRBC: 0 % (ref 0.0–0.2)

## 2018-10-13 NOTE — Progress Notes (Signed)
PROGRESS NOTE    [name] [name]  OXB:[id number] DOB: 06/07/1946 DOA: 10/06/2018 PCP: [name], MD   Brief Narrative: Patient is a 73 year old female with history of combined systolic/diastolic CHF, A. fib, CKD stage III-4, liver cirrhosis secondary to [place], status post placement of placement after AV node ablation, severe MR, TR, pulmonary hypertension, OSA on CPAP, iron deficiency anemia who presented with increased shortness of breath.  Admitted for CHF exacerbation.  She reported gaining more than 10 pounds.  Also found to be anemic and an acute kidney injury on CKD.  Cardiology, GI following.  Underwent VCE  and also left-sided thoracentesis with removal of 1.7 liters of fluid on 10/09/18.  Plan is to continue IV diuresis until optimization of CHF status.  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 or unspecified chronic kidney disease, with heart failure (HCC) Active Problems:   CKD stage G3b/A1, GFR 30-44 and albumin creatinine ratio <30 mg/g (HCC)   Acute on chronic combined systolic and diastolic CHF (congestive heart failure) (HCC)   Symptomatic anemia   Acute renal failure superimposed on stage 4 chronic kidney disease (HCC)   Hypokalemia   Hyponatremia   Thrombocytopenia (HCC)   Acute on chronic combined systolic and diastolic congestive heart failure, NYHA class 4 (HCC)   Acute on chronic systolic and diastolic heart failure, NYHA class 4 (HCC)   Goals of care, counseling/discussion   Palliative care by specialist   DNR (do not resuscitate) discussion  Acute on chronic combined systolic/diastolic CHF: Currently on Lasix 120 mg TID.  Also on metolazone.  Continue diuresis.  Has been having good diuresis.Continue to monitor input/output. On Imdur.  Not on ACE/ARB or Aldactone due to advanced kidney disease.  Heart failure team following. Echocardiogram done on 10/07/2018 showed ejection fraction of 30 to 35% mid to  distal anterior, apical and mid to distal inferior hypokinesis.  Acute on chronic anemia with history of recent GI bleed: Status post 2 units of blood transfusion on 10/08/2018 after her hemoglobin dropped to 6.1.  This morning 8.  GI was following.  EGD done last hospitalization showed erosive gastritis.  Continue PPI.  Patient declines colonoscopy.  Underwent video capsule endoscopy on 10/09/17 and found to negative for bleeding.  Her anemia could be associated with her chronic kidney disease.  Acute on chronic kidney disease stage III-IV:  Cardiorenal syndrome.Creatinine today 2.99.  Heart failure team following.  Continue diuresis.  Avoid nephrotoxic agents.  Follows with Dr. Jimmy Footman as an outpatient.  Right-sided pleural effusion:Secondary to CHF exacerbation.  Underwent ultrasound-guided thoracentesis with goal of 1.7 L of fluid on 10/09/2018  Hypokalemia: Continue daily replacement.Potassium 4.2 today  Chronic A. fib with history of AV node ablation/PPM: Has refused anticoagulation in the past.  Aspirin also on hold due to severe anemia.  Currently rate is controlled  Hyponatremia:Secondary  to CHF.Resolved  Severe mitral regurgitation: Follows with Dr. Burt Knack.  Was considered for possible mitral clip.  OSA: Continue CPAP  Thrombocytopenia: Chronic.  Continue to monitor.  Deconditioning/debility/multiple comorbidities/poor prognosis: Palliative Care following.  She is DNR.         DVT prophylaxis:SCD Code Status: DNR Family Communication: None present at the bedside Disposition Plan: She is from home.  Anticipated discharge to home when she is medically stable.   Consultants: Heart failure, GI  Procedures: Video capsule endoscopy, thoracentesis  Antimicrobials:None  Subjective: Patient seen and examined the bedside this morning.   Looks comfortable.  Sitting  at the edge of the bed and eating breakfast.  Does not look dyspneic.  Improving lower extremity  edema.  Objective: Vitals:   10/12/18 1331 10/12/18 1951 10/13/18 0459 10/13/18 0915  BP: 124/65 (!) 109/57 131/82 129/64  Pulse: 96 90 98 (!) 101  Resp: 20 18 20    Temp: 97.9 F (36.6 C) 98 F (36.7 C) 98 F (36.7 C)   TempSrc: Oral Oral Oral   SpO2: 97% 95% 100% 100%  Weight:   111.6 kg   Height:        Intake/Output Summary (Last 24 hours) at 10/13/2018 1111 Last data filed at 10/13/2018 0944 Gross per 24 hour  Intake 1146 ml  Output 2151 ml  Net -1005 ml   Filed Weights   10/11/18 0614 10/12/18 0607 10/13/18 0459  Weight: 113.3 kg 112.5 kg 111.6 kg    Examination:   General exam: obese, appears comfortable HEENT:PERRL,Oral mucosa moist, Ear/Nose normal on gross exam Respiratory system: Bilateral equal air entry, normal vesicular breath sounds, no wheezes or crackles  Cardiovascular system: S1 & S2 heard, RRR. Elevated  JVD,No murmurs, rubs, gallops or clicks. Gastrointestinal system: Abdomen is nondistended, soft and nontender. No organomegaly or masses felt. Normal bowel sounds heard. Central nervous system: Alert and oriented. No focal neurological deficits. Extremities:Bilateral lower extremity edema, no clubbing ,no cyanosis, distal peripheral pulses palpable. Skin:  Dark discoloration of bilateral lower extremities most likely secondary to chronic venous stasis, No lesions or ulcers,no icterus ,no pallor     Data Reviewed: I have personally reviewed following labs and imaging studies  CBC: Recent Labs  Lab 10/06/18 1309  10/09/18 0514 10/10/18 0610 10/11/18 0437 10/12/18 0541 10/13/18 0457  WBC 5.2   < > 5.5 5.2 4.3 3.9* 4.0  NEUTROABS 3.7  --   --   --   --   --   --   HGB 7.2*   < > 7.3* 8.0* 7.9* 7.9* 8.0*  HCT 23.6*   < > 23.6* 26.6* 26.0* 26.2* 26.4*  MCV 98.3   < > 95.2 95.7 98.1 98.1 97.1  PLT 105*   < > 94* 102* 104* 123* 123*   < > = values in this interval not displayed.   Basic Metabolic Panel: Recent Labs  Lab 10/09/18 0514  10/10/18 0610 10/11/18 0437 10/12/18 0541 10/13/18 0457  NA 135 136 136 136 137  K 4.0 3.7 3.6 4.1 4.2  CL 96* 91* 91* 91* 92*  CO2 30 32 34* 33* 33*  GLUCOSE 104* 110* 103* 95 98  BUN 100* 97* 96* 92* 90*  CREATININE 2.99* 2.99* 2.89* 2.74* 2.99*  CALCIUM 8.8* 9.1 8.9 9.0 9.0  MG 2.4  --   --   --   --    GFR: Estimated Creatinine Clearance: 21.2 mL/min (A) (by C-G formula based on SCr of 2.99 mg/dL (H)). Liver Function Tests: Recent Labs  Lab 10/06/18 1309  AST 33  ALT 22  ALKPHOS 49  BILITOT 1.9*  PROT 6.3*  ALBUMIN 3.5   No results for input(s): LIPASE, AMYLASE in the last 168 hours. No results for input(s): AMMONIA in the last 168 hours. Coagulation Profile: No results for input(s): INR, PROTIME in the last 168 hours. Cardiac Enzymes: No results for input(s): CKTOTAL, CKMB, CKMBINDEX, TROPONINI in the last 168 hours. BNP (last 3 results) No results for input(s): PROBNP in the last 8760 hours. HbA1C: No results for input(s): HGBA1C in the last 72 hours. CBG: No results for input(s):  GLUCAP in the last 168 hours. Lipid Profile: No results for input(s): CHOL, HDL, LDLCALC, TRIG, CHOLHDL, LDLDIRECT in the last 72 hours. Thyroid Function Tests: No results for input(s): TSH, T4TOTAL, FREET4, T3FREE, THYROIDAB in the last 72 hours. Anemia Panel: Recent Labs    10/12/18 0541  VITAMINB12 2,717*  FOLATE 32.6  FERRITIN 146  TIBC 361  IRON 41  RETICCTPCT 6.0*   Sepsis Labs: No results for input(s): PROCALCITON, LATICACIDVEN in the last 168 hours.  Recent Results (from the past 240 hour(s))  Urine culture     Status: Abnormal   Collection Time: 10/06/18 12:56 PM  Result Value Ref Range Status   Specimen Description URINE, RANDOM  Final   Special Requests NONE  Final   Culture (A)  Final    <10,000 COLONIES/mL INSIGNIFICANT GROWTH Performed at [facility], [address], [place], [place] [address]    Report Status 10/07/2018 FINAL  Final          Radiology Studies: No results found.      Scheduled Meds: . allopurinol  300 mg Oral QPM  . calcitRIOL  0.25 mcg Oral QODAY  . ferrous sulfate  325 mg Oral QHS  . HYDROcodone-acetaminophen  1 tablet Oral BID  . isosorbide mononitrate  15 mg Oral QHS  . lactose free nutrition  237 mL Oral TID WC  . metolazone  2.5 mg Oral Daily  . multivitamin with minerals  1 tablet Oral QHS  . pantoprazole  40 mg Oral BID  . potassium chloride  40 mEq Oral TID  . vitamin B-12  500 mcg Oral Daily   Continuous Infusions: . sodium chloride 1,000 mL (10/12/18 1430)  . furosemide 120 mg (10/13/18 0549)     LOS: 6 days    Time spent: 25 mins.More than 50% of that time was spent in counseling and/or coordination of care.      Shelly Coss, MD [facility] Pager [phone]  If 7PM-7AM, please contact night-coverage [email or web address] Password [email or web address] 10/13/2018, 11:11 AM

## 2018-10-13 NOTE — Progress Notes (Signed)
Advanced Heart Failure Rounding Note  PCP-Cardiologist: Dorris Carnes, MD   Subjective:    Breathing ok. Denies orthopnea or PND. Continues to diurese on IV lasix and metolazone. Weight down another 2 pounds - 12 pounds total. Creatinine 2.89 -> 2.74 -> 2.99. Hgb stable at 8.0 today.  She is worried about going home.   Studies:  Echo 10/07/18: EF 30-35% (down from 40-45% 05/2018). Mild AS, mod MR, RV normal function, mod TR,  PA peak pressure 59 mmHg.  Capsule study 1/15 normal. GI has signed off.  R thoracentesis 1/15 with 1.7 L golden yellow fluid.    Objective:   Weight Range: 111.6 kg Body mass index is 39.72 kg/m.   Vital Signs:   Temp:  [97.9 F (36.6 C)-98 F (36.7 C)] 98 F (36.7 C) (01/19 0459) Pulse Rate:  [90-101] 101 (01/19 0915) Resp:  [18-20] 20 (01/19 0459) BP: (109-131)/(57-82) 129/64 (01/19 0915) SpO2:  [95 %-100 %] 100 % (01/19 0915) Weight:  [111.6 kg] 111.6 kg (01/19 0459) Last BM Date: 10/13/18  Weight change: Filed Weights   10/11/18 0614 10/12/18 0607 10/13/18 0459  Weight: 113.3 kg 112.5 kg 111.6 kg    Intake/Output:   Intake/Output Summary (Last 24 hours) at 10/13/2018 1121 Last data filed at 10/13/2018 0944 Gross per 24 hour  Intake 1146 ml  Output 2151 ml  Net -1005 ml      Physical Exam    General:  Sitting up in bed. No resp difficulty HEENT: normal Neck: supple. JVP to jaw with prominent v- waves Carotids 2+ bilat; no bruits. No lymphadenopathy or thryomegaly appreciated. Cor: PMI nondisplaced. Regular rate & rhythm. 2/6 MR/TR Lungs: clear but decreased throughout Abdomen: obese soft, nontender, nondistended. No hepatosplenomegaly. No bruits or masses. Good bowel sounds. Extremities: no cyanosis, clubbing, rash, trace edema Neuro: alert & orientedx3, cranial nerves grossly intact. moves all 4 extremities w/o difficulty. Affect pleasant   Telemetry   Vpaced 90-100s Personally reviewed.   EKG    No new tracings.   Labs    CBC Recent Labs    10/12/18 0541 10/13/18 0457  WBC 3.9* 4.0  HGB 7.9* 8.0*  HCT 26.2* 26.4*  MCV 98.1 97.1  PLT 123* 840*   Basic Metabolic Panel Recent Labs    10/12/18 0541 10/13/18 0457  NA 136 137  K 4.1 4.2  CL 91* 92*  CO2 33* 33*  GLUCOSE 95 98  BUN 92* 90*  CREATININE 2.74* 2.99*  CALCIUM 9.0 9.0   Liver Function Tests No results for input(s): AST, ALT, ALKPHOS, BILITOT, PROT, ALBUMIN in the last 72 hours. No results for input(s): LIPASE, AMYLASE in the last 72 hours. Cardiac Enzymes No results for input(s): CKTOTAL, CKMB, CKMBINDEX, TROPONINI in the last 72 hours.  BNP: BNP (last 3 results) Recent Labs    06/20/18 2249 10/06/18 1309  BNP 160.6* 214.8*    ProBNP (last 3 results) No results for input(s): PROBNP in the last 8760 hours.   D-Dimer No results for input(s): DDIMER in the last 72 hours. Hemoglobin A1C No results for input(s): HGBA1C in the last 72 hours. Fasting Lipid Panel No results for input(s): CHOL, HDL, LDLCALC, TRIG, CHOLHDL, LDLDIRECT in the last 72 hours. Thyroid Function Tests No results for input(s): TSH, T4TOTAL, T3FREE, THYROIDAB in the last 72 hours.  Invalid input(s): FREET3  Other results:   Imaging    No results found.   Medications:     Scheduled Medications: . allopurinol  300 mg  Oral QPM  . calcitRIOL  0.25 mcg Oral QODAY  . ferrous sulfate  325 mg Oral QHS  . HYDROcodone-acetaminophen  1 tablet Oral BID  . isosorbide mononitrate  15 mg Oral QHS  . lactose free nutrition  237 mL Oral TID WC  . metolazone  2.5 mg Oral Daily  . multivitamin with minerals  1 tablet Oral QHS  . pantoprazole  40 mg Oral BID  . potassium chloride  40 mEq Oral TID  . vitamin B-12  500 mcg Oral Daily    Infusions: . sodium chloride 1,000 mL (10/12/18 1430)  . furosemide 120 mg (10/13/18 0549)    PRN Medications: sodium chloride, acetaminophen, diphenhydrAMINE, docusate sodium, methocarbamol, ondansetron (ZOFRAN)  IV, zolpidem    Patient Profile   [name] Lanza Sargentis a 73 y.o.femalewith a history of morbid obesity, afib s/p AV ablation andSt JudePPM, chronic combined HF, CKD IV, severe MR, TR, pulmonary HTN, OSA on BiPAP, GERD, gout, and iron deficiency anemia.Cardiac cath 2009 with normal coronary arteries.   Admitted with SOB and weight gain.   Assessment/Plan   1. Acute on chronic combined HF: Echo 06/21/18 with EF 40-45%, LV severely dilated, mild LVH, akinesis of mid-apicalanteroseptal and apical myocardium, mild AS, severe MR, LA and RA severely dilated, moderate TR, PA peak pressure 42 mmHg. Cause of cardiomyopathy uncertain. Could be valvular, could be due to chronic RV pacing (has not had coronary angiography that I can see and now with CKD stage IV). [place] 10/4 showed preserved cardiac output with markedly elevated right and left heart filling pressures (very prominent RV failure). TEE looked like restrictive cardiomyopathy. - Volume status improved but CVP still elevated. Weight down11 lbs total. Now 246. Previous d/c weight was 240.  - With rising creatinine will hold metolazone today. Continue IV lasix. Will need to get on stable dose of oral diuretics prior to discharge.  - She was taken off hydralazine due to soft BP. Continue imdur 15 mg daily  - No bblocker yet  - No spiro/ARB/ACEI/ARNI with CKD stage IV.  - Refuses UNNA boots or TED hose.  - Echo 10/07/18: EF 30-35% (down from 40-45% 05/2018). Mild AS, mod MR, RV normal function, mod TR,  PA peak pressure 59 mmHg.  - Palliative consulted. She is now a DNR/DNI.   2. Mitral regurgitation: Severe MR on echo9/27/19 (mild on echo 10/2016). TEE on 10/4 showed moderate-severe central MR with some restriction of posterior leaflet. Tricuspid regurgitation was more impressive.  - She was evaluated by Dr Lorenza Chick 10/16 for possible mitral clip. There was concern mitral clip may not make a difference due to multiple co-morbidities  including RV failure with severe TR. - continue medical therapy. MR moderate on echo this admit. No change.  3. Afib s/p AV nodal ablation and PPM: Chronic RV pacing in the 90-100s. - With dyssynchrony and decreased LV EF, could consider CRT upgrade at some point but doubt that will improve current situation - She refuses anticoagulation ("caused my teeth to fall out and my hair to fall out"). She will only take ASA. ASA held with anemia. No change.  4. Anemia/Recent GIB. - Hemoglobin stable at 8.0 today - GI consulted last admission. She had an EGD that showed erosive gastritis. PPI increased.  - Had capsule study 1/15, which was normal. GI has signed off.  - LDH 447. ?hemolysis off valves. - Likely need Aranesp. Will discuss again with PharmD yesterday 5. OSA: Continue BiPAP qHS. No change.  6. CKD Stage  4: Baseline creatinine around 2.5. - Creatinine 2.94-> 3.03 -> 2.99 -> 2.99 -> 2.89 -> 2.74 -> 2.99 today. Monitor closely. Follows with Nephrology outpatient. 7. Tricuspid regurgitation: -Severe TR on TEE 05/2018. Moderate TR on echo this admit (severe per Dr Haroldine Laws). No change.  8. Mild aortic stenosis - Mild on echo this admit. No change.  9. Hypokalemia - K 4.2 today 10. Right pleural effusion - S/p thoracentesis 1/16 with 1.7 L golden yellow fluid off. Nothing sent for testing. No change.   Medication concerns reviewed with patient and pharmacy team. Barriers identified: none at this time.   Length of Stay: Humphreys, MD  10/13/2018, 11:21 AM  Advanced Heart Failure Team Pager [phone] (M-F; 7a - 4p)  Please contact [facility] for night-coverage after hours (4p -7a ) and weekends on [email or web address]

## 2018-10-14 LAB — CBC
HCT: 26.6 % — ABNORMAL LOW (ref 36.0–46.0)
Hemoglobin: 8.1 g/dL — ABNORMAL LOW (ref 12.0–15.0)
MCH: 29.7 pg (ref 26.0–34.0)
MCHC: 30.5 g/dL (ref 30.0–36.0)
MCV: 97.4 fL (ref 80.0–100.0)
Platelets: 129 10*3/uL — ABNORMAL LOW (ref 150–400)
RBC: 2.73 MIL/uL — ABNORMAL LOW (ref 3.87–5.11)
RDW: 23.2 % — ABNORMAL HIGH (ref 11.5–15.5)
WBC: 3.7 10*3/uL — ABNORMAL LOW (ref 4.0–10.5)
nRBC: 0 % (ref 0.0–0.2)

## 2018-10-14 LAB — BASIC METABOLIC PANEL
Anion gap: 14 (ref 5–15)
BUN: 89 mg/dL — ABNORMAL HIGH (ref 8–23)
CO2: 32 mmol/L (ref 22–32)
Calcium: 8.8 mg/dL — ABNORMAL LOW (ref 8.9–10.3)
Chloride: 92 mmol/L — ABNORMAL LOW (ref 98–111)
Creatinine, Ser: 3.2 mg/dL — ABNORMAL HIGH (ref 0.44–1.00)
GFR calc Af Amer: 16 mL/min — ABNORMAL LOW (ref >60)
GFR, EST NON AFRICAN AMERICAN: 14 mL/min — AB (ref >60)
GLUCOSE: 102 mg/dL — AB (ref 70–99)
Potassium: 4.1 mmol/L (ref 3.5–5.1)
Sodium: 138 mmol/L (ref 135–145)

## 2018-10-14 MED ORDER — SODIUM CHLORIDE 0.9 % IV SOLN
510.0000 mg | Freq: Once | INTRAVENOUS | Status: DC
  Filled 2018-10-14: qty 17

## 2018-10-14 NOTE — Telephone Encounter (Signed)
Discussed with Dr. Oneida Alar, see below, copied from staff message she sent.   "Pt admitted in [place]. Just had [place]: NEG. PUT HER ON A CANCELLATION LIST TO BE DONE BEFORE MAR."   Please put her on cancellation list for colonoscopy with propofol, currently scheduled in March. Also , patient needs Trilyte due to her renal insufficiency. Once she is discharged from hospital we may need to give further order adjustments based on discharge medications. I will keep you posted.

## 2018-10-14 NOTE — Progress Notes (Addendum)
Advanced Heart Failure Rounding Note  PCP-Cardiologist: Dorris Carnes, MD   Subjective:    Metolazone held with rising creatinine. Received 120 mg IV lasix TID yesterday. Modest diuresis. Down 1 lb overnight (13 lbs total). Creatinine trending up 2.99 -> 3.20. Hemoglobin stable 8.1.  Denies SOB walking around room. No orthopnea. No bleeding.   Studies:  Echo 10/07/18: EF 30-35% (down from 40-45% 05/2018). Mild AS, mod MR, RV normal function, mod TR,  PA peak pressure 59 mmHg.  Capsule study 1/15 normal. GI has signed off.  R thoracentesis 1/15 with 1.7 L golden yellow fluid.    Objective:   Weight Range: 111.4 kg Body mass index is 39.62 kg/m.   Vital Signs:   Temp:  [98.1 F (36.7 C)-98.3 F (36.8 C)] 98.1 F (36.7 C) (01/20 0636) Pulse Rate:  [96-102] 98 (01/20 0636) Resp:  [17-20] 18 (01/20 0636) BP: (109-129)/(41-65) 109/41 (01/20 0636) SpO2:  [97 %-100 %] 100 % (01/20 0636) Weight:  [111.4 kg-111.8 kg] 111.4 kg (01/20 0636) Last BM Date: 10/13/18  Weight change: Filed Weights   10/13/18 0459 10/14/18 0304 10/14/18 0636  Weight: 111.6 kg 111.8 kg 111.4 kg    Intake/Output:   Intake/Output Summary (Last 24 hours) at 10/14/2018 0720 Last data filed at 10/14/2018 0655 Gross per 24 hour  Intake 2024.48 ml  Output 2950 ml  Net -925.52 ml      Physical Exam    General: No resp difficulty. HEENT: Normal Neck: Supple. JVP to jaw with prominent v waves.. Carotids 2+ bilat; no bruits. No thyromegaly or nodule noted. Cor: PMI nondisplaced. RRR, 2/6 MR/TR. Lungs: CTAB, normal effort. Abdomen: Soft, non-tender, non-distended, no HSM. No bruits or masses. +BS  Extremities: No cyanosis, clubbing, or rash. R and LLE trace ankle edema.  Neuro: Alert & orientedx3, cranial nerves grossly intact. moves all 4 extremities w/o difficulty. Affect pleasant  Telemetry   Vpaced 90-100s. Personally reviewed.   EKG    No new tracings.   Labs    CBC Recent Labs   10/13/18 0457 10/14/18 0539  WBC 4.0 3.7*  HGB 8.0* 8.1*  HCT 26.4* 26.6*  MCV 97.1 97.4  PLT 123* 824*   Basic Metabolic Panel Recent Labs    10/13/18 0457 10/14/18 0539  NA 137 138  K 4.2 4.1  CL 92* 92*  CO2 33* 32  GLUCOSE 98 102*  BUN 90* 89*  CREATININE 2.99* 3.20*  CALCIUM 9.0 8.8*   Liver Function Tests No results for input(s): AST, ALT, ALKPHOS, BILITOT, PROT, ALBUMIN in the last 72 hours. No results for input(s): LIPASE, AMYLASE in the last 72 hours. Cardiac Enzymes No results for input(s): CKTOTAL, CKMB, CKMBINDEX, TROPONINI in the last 72 hours.  BNP: BNP (last 3 results) Recent Labs    06/20/18 2249 10/06/18 1309  BNP 160.6* 214.8*    ProBNP (last 3 results) No results for input(s): PROBNP in the last 8760 hours.   D-Dimer No results for input(s): DDIMER in the last 72 hours. Hemoglobin A1C No results for input(s): HGBA1C in the last 72 hours. Fasting Lipid Panel No results for input(s): CHOL, HDL, LDLCALC, TRIG, CHOLHDL, LDLDIRECT in the last 72 hours. Thyroid Function Tests No results for input(s): TSH, T4TOTAL, T3FREE, THYROIDAB in the last 72 hours.  Invalid input(s): FREET3  Other results:   Imaging    No results found.   Medications:     Scheduled Medications: . allopurinol  300 mg Oral QPM  . calcitRIOL  0.25  mcg Oral QODAY  . ferrous sulfate  325 mg Oral QHS  . HYDROcodone-acetaminophen  1 tablet Oral BID  . isosorbide mononitrate  15 mg Oral QHS  . lactose free nutrition  237 mL Oral TID WC  . multivitamin with minerals  1 tablet Oral QHS  . pantoprazole  40 mg Oral BID  . potassium chloride  40 mEq Oral TID  . vitamin B-12  500 mcg Oral Daily    Infusions: . sodium chloride 1,000 mL (10/12/18 1430)  . furosemide 120 mg (10/13/18 2128)    PRN Medications: sodium chloride, acetaminophen, diphenhydrAMINE, docusate sodium, methocarbamol, ondansetron (ZOFRAN) IV, zolpidem    Patient Profile   [name] Dumais  Sargentis a 73 y.o.femalewith a history of morbid obesity, afib s/p AV ablation andSt JudePPM, chronic combined HF, CKD IV, severe MR, TR, pulmonary HTN, OSA on BiPAP, GERD, gout, and iron deficiency anemia.Cardiac cath 2009 with normal coronary arteries.   Admitted with SOB and weight gain.   Assessment/Plan   1. Acute on chronic combined HF: Echo 06/21/18 with EF 40-45%, LV severely dilated, mild LVH, akinesis of mid-apicalanteroseptal and apical myocardium, mild AS, severe MR, LA and RA severely dilated, moderate TR, PA peak pressure 42 mmHg. Cause of cardiomyopathy uncertain. Could be valvular, could be due to chronic RV pacing (has not had coronary angiography that I can see and now with CKD stage IV). [place] 10/4 showed preserved cardiac output with markedly elevated right and left heart filling pressures (very prominent RV failure). TEE looked like restrictive cardiomyopathy. - Volume status okay. JVP still elevated, but symptoms much improved. Weight down 13 lbs total to 245 (previously 240 lbs).  - Hold diuresis today with creatinine up to 3.20. Can hopefully transition to PO tomorrow. She was on torsemide 80 mg BID PTA.  - She was taken off hydralazine due to soft BP. Continue imdur 15 mg daily  - No bblocker yet  - No spiro/ARB/ACEI/ARNI with CKD stage IV.  - Refuses UNNA boots or TED hose.  - Echo 10/07/18: EF 30-35% (down from 40-45% 05/2018). Mild AS, mod MR, RV normal function, mod TR,  PA peak pressure 59 mmHg.  - Palliative consulted. She is now a DNR/DNI.   2. Mitral regurgitation: Severe MR on echo9/27/19 (mild on echo 10/2016). TEE on 10/4 showed moderate-severe central MR with some restriction of posterior leaflet. Tricuspid regurgitation was more impressive.  - She was evaluated by Dr Lorenza Chick 10/16 for possible mitral clip. There was concern mitral clip may not make a difference due to multiple co-morbidities including RV failure with severe TR. - Continue medical  therapy. MR moderate on echo this admit. No change.  3. Afib s/p AV nodal ablation and PPM: Chronic RV pacing in the 90-100s. - With dyssynchrony and decreased LV EF, could consider CRT upgrade at some point but doubt that will improve current situation - She refuses anticoagulation ("caused my teeth to fall out and my hair to fall out"). She will only take ASA. ASA held with anemia. No change.  4. Anemia/Recent GIB. - Hemoglobin stable at 8.1 today.  - GI consulted last admission. She had an EGD that showed erosive gastritis. PPI increased.  - Had capsule study 1/15, which was normal. GI has signed off.  - LDH 447. ?hemolysis off valves. - Likely needs Aranesp. Will discuss with PharmD.  5. OSA: Continue BiPAP qHS. No change.  6. CKD Stage 4: Baseline creatinine around 2.5. - Creatinine 2.94-> 3.03 -> 2.99 -> 2.99 ->  2.89 -> 2.74 -> 2.99 -> 3.20 today. Follows with Nephrology outpatient. Hold diuresis today.  7. Tricuspid regurgitation: -Severe TR on TEE 05/2018. Moderate TR on echo this admit (severe per Dr Haroldine Laws). No change.  8. Mild aortic stenosis - Mild on echo this admit. No change.  9. Hypokalemia - K 4.1 today 10. Right pleural effusion - S/p thoracentesis 1/16 with 1.7 L golden yellow fluid off. No change.  Medication concerns reviewed with patient and pharmacy team. Barriers identified: none at this time.   Length of Stay: [place], NP  10/14/2018, 7:20 AM  Advanced Heart Failure Team Pager [phone] (M-F; [place])  Please contact [facility] for night-coverage after hours (4p -7a ) and weekends on [email or web address]   Patient seen and examined with the above-signed Advanced Practice Provider and/or Housestaff. I personally reviewed laboratory data, imaging studies and relevant notes. I independently examined the patient and formulated the important aspects of the plan. I have edited the note to reflect any of my changes or salient points. I have personally  discussed the plan with the patient and/or family.  Remains on IV lasix at 120 IV tid. Metolazone held yesterday. Creatinine back up to 3.20. Will hold all diuretics for 1-2 days and then restart oral diuretics either tomorrow or Wednesday. Will need to make sure she is stable on oral diuretics prior to d/c.   I will see her again on Wednesday.   Glori Bickers, MD  9:08 AM

## 2018-10-14 NOTE — Progress Notes (Signed)
PROGRESS NOTE    [name] [name]  IRS:[id number] DOB: 1945/11/15 DOA: 10/06/2018 PCP: [name], MD   Brief Narrative: Patient is a 73 year old female with history of combined systolic/diastolic CHF, A. fib, CKD stage III-4, liver cirrhosis secondary to [place], status post placement of placement after AV node ablation, severe MR, TR, pulmonary hypertension, OSA on CPAP, iron deficiency anemia who presented with increased shortness of breath.  Admitted for CHF exacerbation.  She reported gaining more than 10 pounds before admisison.  Also found to be anemic and an acute kidney injury on CKD.  Cardiology, GI following.  Underwent VCE  and also left-sided thoracentesis with removal of 1.7 liters of fluid on 10/09/18. Was on IV diuresis but held due to worsening kidney function.  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 or unspecified chronic kidney disease, with heart failure (HCC) Active Problems:   CKD stage G3b/A1, GFR 30-44 and albumin creatinine ratio <30 mg/g (HCC)   Acute on chronic combined systolic and diastolic CHF (congestive heart failure) (HCC)   Symptomatic anemia   Acute renal failure superimposed on stage 4 chronic kidney disease (HCC)   Hypokalemia   Hyponatremia   Thrombocytopenia (HCC)   Acute on chronic combined systolic and diastolic congestive heart failure, NYHA class 4 (HCC)   Acute on chronic systolic and diastolic heart failure, NYHA class 4 (HCC)   Goals of care, counseling/discussion   Palliative care by specialist   DNR (do not resuscitate) discussion  Acute on chronic combined systolic/diastolic CHF: Was Lasix 009 mg TID,held now.  Plan is to start  on oral torsemide tomorrow.  Continue to monitor input/output. On Imdur.  Not on ACE/ARB or Aldactone due to advanced kidney disease.  Heart failure team following. Echocardiogram done on 10/07/2018 showed ejection fraction of 30 to 35% mid to distal anterior,  apical and mid to distal inferior hypokinesis.  Acute on chronic anemia with history of recent GI bleed: Status post 2 units of blood transfusion on 10/08/2018 after her hemoglobin dropped to 6.1.  This morning 8.1 today.  GI was following.  EGD done last hospitalization showed erosive gastritis.  Continue PPI.  Patient declines colonoscopy.  Underwent video capsule endoscopy on 10/09/17 and found to negative for bleeding.  Her anemia could be associated with her chronic kidney disease.  Acute on chronic kidney disease stage III-IV:  Cardiorenal syndrome.Creatinine up today to 3.2  Heart failure team following.  Hold IV diuresis.  Avoid nephrotoxic agents.  Follows with Dr. Jimmy Footman as an outpatient.  Right-sided pleural effusion:Secondary to CHF exacerbation.  Underwent ultrasound-guided thoracentesis with goal of 1.7 L of fluid on 10/09/2018  Hypokalemia: Continue daily replacement.Potassium 4.1 today  Chronic A. fib with history of AV node ablation/PPM: Has refused anticoagulation in the past.  Aspirin also on hold due to severe anemia.  Currently rate is controlled  Hyponatremia:Secondary  to CHF.Resolved  Severe mitral regurgitation: Follows with Dr. Burt Knack.  Was considered for possible mitral clip.  OSA: Continue CPAP  Thrombocytopenia: Chronic.  Continue to monitor.  Deconditioning/debility/multiple comorbidities/poor prognosis: Palliative Care following.  She is DNR.         DVT prophylaxis:SCD Code Status: DNR Family Communication: None present at the bedside Disposition Plan: She is from home.  Anticipated discharge to home when she is medically stable.   Consultants: Heart failure, GI  Procedures: Video capsule endoscopy, thoracentesis  Antimicrobials:None  Subjective: Patient seen and examined the bedside this morning.  Looks  comfortable.  Does not complain of any dyspnea.  Lower extremity edema improving. Objective: Vitals:   10/13/18 2003 10/14/18 0304 10/14/18  0636 10/14/18 1216  BP: 112/61  (!) 109/41 (!) 108/52  Pulse: 96  98 (!) 105  Resp: 20  18 19   Temp: 98.1 F (36.7 C)  98.1 F (36.7 C) (!) 97.4 F (36.3 C)  TempSrc: Oral  Oral Oral  SpO2: 97%  100% 94%  Weight:  111.8 kg 111.4 kg   Height:        Intake/Output Summary (Last 24 hours) at 10/14/2018 1400 Last data filed at 10/14/2018 1219 Gross per 24 hour  Intake 1784.48 ml  Output 3150 ml  Net -1365.52 ml   Filed Weights   10/13/18 0459 10/14/18 0304 10/14/18 0636  Weight: 111.6 kg 111.8 kg 111.4 kg    Examination:    General exam: Appears calm and comfortable ,obese HEENT:PERRL,Oral mucosa moist, Ear/Nose normal on gross exam Respiratory system: Bilateral decreased air entry on  the bases Cardiovascular system: S1 & S2 heard, RRR. No JVD, murmurs, rubs, gallops or clicks. Gastrointestinal system: Abdomen is nondistended, soft and nontender. No organomegaly or masses felt. Normal bowel sounds heard. Central nervous system: Alert and oriented. No focal neurological deficits. Extremities: 2 + pitting lower extremity edema, no clubbing ,no cyanosis, distal peripheral pulses palpable. Skin: Dark discoloration of bilateral lower extremities from chronic venous stasis     Data Reviewed: I have personally reviewed following labs and imaging studies  CBC: Recent Labs  Lab 10/10/18 0610 10/11/18 0437 10/12/18 0541 10/13/18 0457 10/14/18 0539  WBC 5.2 4.3 3.9* 4.0 3.7*  HGB 8.0* 7.9* 7.9* 8.0* 8.1*  HCT 26.6* 26.0* 26.2* 26.4* 26.6*  MCV 95.7 98.1 98.1 97.1 97.4  PLT 102* 104* 123* 123* 174*   Basic Metabolic Panel: Recent Labs  Lab 10/09/18 0514 10/10/18 0610 10/11/18 0437 10/12/18 0541 10/13/18 0457 10/14/18 0539  NA 135 136 136 136 137 138  K 4.0 3.7 3.6 4.1 4.2 4.1  CL 96* 91* 91* 91* 92* 92*  CO2 30 32 34* 33* 33* 32  GLUCOSE 104* 110* 103* 95 98 102*  BUN 100* 97* 96* 92* 90* 89*  CREATININE 2.99* 2.99* 2.89* 2.74* 2.99* 3.20*  CALCIUM 8.8* 9.1  8.9 9.0 9.0 8.8*  MG 2.4  --   --   --   --   --    GFR: Estimated Creatinine Clearance: 19.8 mL/min (A) (by C-G formula based on SCr of 3.2 mg/dL (H)). Liver Function Tests: No results for input(s): AST, ALT, ALKPHOS, BILITOT, PROT, ALBUMIN in the last 168 hours. No results for input(s): LIPASE, AMYLASE in the last 168 hours. No results for input(s): AMMONIA in the last 168 hours. Coagulation Profile: No results for input(s): INR, PROTIME in the last 168 hours. Cardiac Enzymes: No results for input(s): CKTOTAL, CKMB, CKMBINDEX, TROPONINI in the last 168 hours. BNP (last 3 results) No results for input(s): PROBNP in the last 8760 hours. HbA1C: No results for input(s): HGBA1C in the last 72 hours. CBG: No results for input(s): GLUCAP in the last 168 hours. Lipid Profile: No results for input(s): CHOL, HDL, LDLCALC, TRIG, CHOLHDL, LDLDIRECT in the last 72 hours. Thyroid Function Tests: No results for input(s): TSH, T4TOTAL, FREET4, T3FREE, THYROIDAB in the last 72 hours. Anemia Panel: Recent Labs    10/12/18 0541  VITAMINB12 2,717*  FOLATE 32.6  FERRITIN 146  TIBC 361  IRON 41  RETICCTPCT 6.0*   Sepsis Labs: No results  for input(s): PROCALCITON, LATICACIDVEN in the last 168 hours.  Recent Results (from the past 240 hour(s))  Urine culture     Status: Abnormal   Collection Time: 10/06/18 12:56 PM  Result Value Ref Range Status   Specimen Description URINE, RANDOM  Final   Special Requests NONE  Final   Culture (A)  Final    <10,000 COLONIES/mL INSIGNIFICANT GROWTH Performed at [facility], [address], [place], [place] [address]    Report Status 10/07/2018 FINAL  Final         Radiology Studies: No results found.      Scheduled Meds: . allopurinol  300 mg Oral QPM  . calcitRIOL  0.25 mcg Oral QODAY  . ferrous sulfate  325 mg Oral QHS  . HYDROcodone-acetaminophen  1 tablet Oral BID  . isosorbide mononitrate  15 mg Oral QHS  . lactose free  nutrition  237 mL Oral TID WC  . multivitamin with minerals  1 tablet Oral QHS  . pantoprazole  40 mg Oral BID  . potassium chloride  40 mEq Oral TID  . vitamin B-12  500 mcg Oral Daily   Continuous Infusions: . sodium chloride 1,000 mL (10/12/18 1430)  . ferumoxytol       LOS: 7 days    Time spent: 25 mins.More than 50% of that time was spent in counseling and/or coordination of care.      Shelly Coss, MD [facility] Pager [phone]  If 7PM-7AM, please contact night-coverage [email or web address] Password [email or web address] 10/14/2018, 2:00 PM

## 2018-10-14 NOTE — Telephone Encounter (Signed)
Patient added to cancellation list

## 2018-10-14 NOTE — Evaluation (Signed)
Physical Therapy Evaluation & Discharge Patient Details Name: [name] [name] MRN: [id number] DOB: 03-25-1946 Today's Date: 10/14/2018   History of Present Illness  Pt is a 73 y.o. female admitted 10/07/18 with CHF exacerbation. PMH includes afib, NICM, OA, DJD, CHF, cataracts.  Clinical Impression  Patient evaluated by Physical Therapy with no further acute PT needs identified. PTA, pt mod indep with mobiltiy and ADLs; able to amb short distance with RW, but typically relies on wheelchair/scooter to get around; lives with supportive daughter. Pt currently mod indep with transfers; functioning at or near baseline mobility. All education has been completed and the patient has no further questions. Acute PT is signing off. Thank you for this referral.    Follow Up Recommendations No PT follow up;Supervision - Intermittent    Equipment Recommendations  Other (comment)(bariatric rolling walker)    Recommendations for Other Services       Precautions / Restrictions Precautions Precautions: Fall Restrictions Weight Bearing Restrictions: No      Mobility  Bed Mobility Overal bed mobility: Modified Independent             General bed mobility comments: Increased time  Transfers Overall transfer level: Modified independent Equipment used: None Transfers: Sit to/from [facility] Sit to Stand: Modified independent (Device/Increase time) Stand pivot transfers: Modified independent (Device/Increase time)       General transfer comment: Mod indep to take pivotal steps from bed<>BSC with UE support on hand rail  Ambulation/Gait                Stairs            Wheelchair Mobility    Modified Rankin (Stroke Patients Only)       Balance Overall balance assessment: Needs assistance Sitting-balance support: No upper extremity supported;Feet supported Sitting balance-Leahy Scale: Good     Standing balance support: Single extremity  supported;During functional activity Standing balance-Leahy Scale: Poor Standing balance comment: Reliant on UE support                             Pertinent Vitals/Pain Pain Assessment: Faces Faces Pain Scale: Hurts a little bit Pain Location: Back Pain Descriptors / Indicators: Constant Pain Intervention(s): Limited activity within patient's tolerance    Home Living Family/patient expects to be discharged to:: Private residence Living Arrangements: Children Available Help at Discharge: Family;Available PRN/intermittently Type of Home: House Home Access: Level entry     Home Layout: Two level;Able to live on main level with bedroom/bathroom Home Equipment: Tub bench;Walker - 2 wheels;Bedside commode;Cane - single point;Electric scooter;Hospital bed;Wheelchair - manual      Prior Function Level of Independence: Independent with assistive device(s)         Comments: Able to amb short distance into/out of bathroom since w/c doesn't fit. Indep with transfers to/from wheelchair and electric scooter. Does not drive. Indep with ADLs     Hand Dominance        Extremity/Trunk Assessment   Upper Extremity Assessment Upper Extremity Assessment: Overall WFL for tasks assessed    Lower Extremity Assessment Lower Extremity Assessment: Overall WFL for tasks assessed       Communication   Communication: No difficulties  Cognition Arousal/Alertness: Awake/alert Behavior During Therapy: WFL for tasks assessed/performed Overall Cognitive Status: Within Functional Limits for tasks assessed  General Comments      Exercises     Assessment/Plan    PT Assessment Patent does not need any further PT services  PT Problem List         PT Treatment Interventions      PT Goals (Current goals can be found in the Care Plan section)  Acute Rehab PT Goals PT Goal Formulation: All assessment and education  complete, DC therapy    Frequency     Barriers to discharge        Co-evaluation               AM-PAC PT "6 Clicks" Mobility  Outcome Measure Help needed turning from your back to your side while in a flat bed without using bedrails?: None Help needed moving from lying on your back to sitting on the side of a flat bed without using bedrails?: None Help needed moving to and from a bed to a chair (including a wheelchair)?: None Help needed standing up from a chair using your arms (e.g., wheelchair or bedside chair)?: None Help needed to walk in hospital room?: A Little Help needed climbing 3-5 steps with a railing? : A Lot 6 Click Score: 21    End of Session   Activity Tolerance: Patient tolerated treatment well Patient left: in bed;with call bell/phone within reach Nurse Communication: Mobility status PT Visit Diagnosis: Other abnormalities of gait and mobility (R26.89)    Time: 1447-1500 PT Time Calculation (min) (ACUTE ONLY): 13 min   Charges:   PT Evaluation $PT Eval Low Complexity: [place], PT, DPT Acute Rehabilitation Services  Pager [phone] Office [place] 10/14/2018, 3:46 PM

## 2018-10-14 NOTE — Care Management Important Message (Signed)
Important Message  Patient Details  Name: [name] [name] MRN: [id number] Date of Birth: 1945-10-02   Medicare Important Message Given:  Yes    [name] [name] 10/14/2018, 2:29 PM

## 2018-10-15 LAB — BASIC METABOLIC PANEL
Anion gap: 12 (ref 5–15)
BUN: 88 mg/dL — ABNORMAL HIGH (ref 8–23)
CO2: 30 mmol/L (ref 22–32)
Calcium: 9.2 mg/dL (ref 8.9–10.3)
Chloride: 93 mmol/L — ABNORMAL LOW (ref 98–111)
Creatinine, Ser: 3.4 mg/dL — ABNORMAL HIGH (ref 0.44–1.00)
GFR calc Af Amer: 15 mL/min — ABNORMAL LOW (ref >60)
GFR calc non Af Amer: 13 mL/min — ABNORMAL LOW (ref >60)
Glucose, Bld: 100 mg/dL — ABNORMAL HIGH (ref 70–99)
Potassium: 4.7 mmol/L (ref 3.5–5.1)
Sodium: 135 mmol/L (ref 135–145)

## 2018-10-15 LAB — CBC
HCT: 28.1 % — ABNORMAL LOW (ref 36.0–46.0)
Hemoglobin: 8.4 g/dL — ABNORMAL LOW (ref 12.0–15.0)
MCH: 29.1 pg (ref 26.0–34.0)
MCHC: 29.9 g/dL — ABNORMAL LOW (ref 30.0–36.0)
MCV: 97.2 fL (ref 80.0–100.0)
Platelets: 144 10*3/uL — ABNORMAL LOW (ref 150–400)
RBC: 2.89 MIL/uL — ABNORMAL LOW (ref 3.87–5.11)
RDW: 22.8 % — AB (ref 11.5–15.5)
WBC: 4.4 10*3/uL (ref 4.0–10.5)
nRBC: 0 % (ref 0.0–0.2)

## 2018-10-15 MED ORDER — POTASSIUM CHLORIDE CRYS ER 20 MEQ PO TBCR: 40.0000 meq | EXTENDED_RELEASE_TABLET | Freq: Every day | ORAL | Status: DC

## 2018-10-15 NOTE — Consult Note (Signed)
Reason for Consult: Acute kidney injury on chronic kidney disease stage IV Referring Physician: Shelly Coss M.D. [facility])  HPI:  73 year old Caucasian woman with past medical history significant for hypertension, obesity, chronic combined systolic and diastolic congestive heart failure and consequent chronic kidney disease stage IV (baseline creatinine 2.6, sees Dr. Jimmy Footman).  She also has a history of atrial fibrillation, OSA/OHS on CPAP and cirrhosis secondary to nonalcoholic steatohepatitis.  She is status post pacemaker placement following AV node ablation.  She was admitted to the hospital about 9 days ago with complaints of worsening shortness of breath following relatively recent discharge from the hospital 1 week prior to that following admission for ABLA from erosive gastritis/GI bleed.  Since admission, she had up titration of diuretic therapy and had lost about 13 pounds of fluid weight following which metolazone and furosemide were held due to rising creatinine.  She underwent thoracentesis with removal of 1.7 L transudative fluid on 10/09/2018.  She reports some mild shortness of breath with exertion but none with rest.  She complains of bilateral leg numbness with intermittent pain/discomfort and is concerned with her 2.5 pound weight gain overnight.  She denies any dysuria, flank pain, fever or chills.  Her blood pressures have been on the softer side without clear critical hypotension.   10/07/2018  10/09/2018  10/11/2018  10/13/2018  10/14/2018  10/15/2018   BUN 98 (H) 100 (H) 96 (H) 90 (H) 89 (H) 88 (H)  Creatinine 3.06 (H) 2.99 (H) 2.89 (H) 2.99 (H) 3.20 (H) 3.40 (H)    Past Medical History:  Diagnosis Date  . Anemia   . Atrial fibrillation ([place])   . Cataract   . Cervical cancer ([place]) 1991   s/p hysterectomy  . Chronic cellulitis   . Chronic combined systolic and diastolic congestive heart failure, NYHA class 2 (HCC)    LVEF 25-30% with restrictive diastolic filling  .  Degenerative joint disease   . Diverticulitis   . Essential hypertension   . GERD (gastroesophageal reflux disease)   . Gout   . Kidney stones   . Morbid obesity ([place])   . NICM (nonischemic cardiomyopathy) ([place]) 02/22/2017  . Osteoarthritis   . Permanent atrial fibrillation 05/04/2009   Qualifier: History of  By: Quentin Cornwall CMA, Janett Billow    . 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   Qualifier: Diagnosis of  By: Lovena Le, MD, [facility], Binnie Kand   . Sinoatrial node dysfunction [facility])    Status post PPM - Dr. Lovena Le  . Sleep apnea    CPAP    Past Surgical History:  Procedure Laterality Date  . ABDOMINAL HYSTERECTOMY  1991  . BIOPSY  09/26/2018   Procedure: BIOPSY;  Surgeon: Lavena Bullion, DO;  Location: [facility];  Service: Gastroenterology;;  . COLONOSCOPY  1998   one polyp per patient  . ESOPHAGOGASTRODUODENOSCOPY (EGD) WITH PROPOFOL N/A 09/26/2018   Procedure: ESOPHAGOGASTRODUODENOSCOPY (EGD) WITH PROPOFOL;  Surgeon: Lavena Bullion, DO;  Location: [facility];  Service: Gastroenterology;  Laterality: N/A;  . EYE SURGERY    . GIVENS CAPSULE STUDY N/A 10/09/2018   Procedure: GIVENS CAPSULE STUDY;  Surgeon: Carol Ada, MD;  Location: [facility];  Service: Endoscopy;  Laterality: N/A;  . IR THORACENTESIS ASP PLEURAL SPACE W/IMG GUIDE  10/09/2018  . PACEMAKER INSERTION  Nov 2000   St Jude with revision in 2011  . PERCUTANEOUS NEPHROLITHOTOMY  April 2012  . RIGHT HEART CATH N/A 06/28/2018   Procedure: RIGHT HEART CATH;  Surgeon: Jolaine Artist,  MD;  Location: [facility] CV LAB;  Service: Cardiovascular;  Laterality: N/A;  . TEE WITHOUT CARDIOVERSION N/A 06/28/2018   Procedure: TRANSESOPHAGEAL ECHOCARDIOGRAM (TEE);  Surgeon: Jolaine Artist, MD;  Location[facility];  Service: Cardiovascular;  Laterality: N/A;    Family History  Problem Relation Age of Onset  . Heart attack Father   . Stroke Mother   . Diabetes Sister   . Colon cancer Other        seven  family members  . Liver disease Neg Hx   . Other Neg Hx     Social History:  reports that she quit smoking about 39 years ago. Her smoking use included cigarettes. She smoked 1.00 pack per day. She has never used smokeless tobacco. She reports that she does not drink alcohol or use drugs.  Allergies:  Allergies  Allergen Reactions  . Cephalexin Shortness Of Breath  . Codeine Anaphylaxis    REACTION: throat swelling  . Contrast Media [Iodinated Diagnostic Agents] Other (See Comments)    Patient states "I have chronic kidney disease so the doctor said no dye in my veins."  . Peppermint Flavor Shortness Of Breath  . Prednisone Anaphylaxis, Shortness Of Breath and Swelling    REACTION: swelling, S.O.B.  . Tape Other (See Comments)    States plastic tape blisters her skin  . Ciprofloxacin Hives    Tolerating levofloxin   . Latex Itching and Rash  . Penicillins Hives    DID THE REACTION INVOLVE: Swelling of the face/tongue/throat, SOB, or low BP? No Sudden or severe rash/hives, skin peeling, or the inside of the mouth or nose? Yes Did it require medical treatment? Pt was in hospital at the time of reaction When did it last happen?age - mid 40's If all above answers are "NO", may proceed with cephalosporin use.  . Coreg [Carvedilol] Other (See Comments)    Beta Blockers cause her organs to shut down per patient  . Diamox [Acetazolamide] Rash    Rash after 2 doses of diamox     Medications:  Scheduled: . allopurinol  300 mg Oral QPM  . calcitRIOL  0.25 mcg Oral QODAY  . ferrous sulfate  325 mg Oral QHS  . HYDROcodone-acetaminophen  1 tablet Oral BID  . isosorbide mononitrate  15 mg Oral QHS  . lactose free nutrition  237 mL Oral TID WC  . multivitamin with minerals  1 tablet Oral QHS  . pantoprazole  40 mg Oral BID  . vitamin B-12  500 mcg Oral Daily    BMP Latest Ref Rng & Units 10/15/2018 10/14/2018 10/13/2018  Glucose 70 - 99 mg/dL 100(H) 102(H) 98  BUN 8 - 23 mg/dL  88(H) 89(H) 90(H)  Creatinine 0.44 - 1.00 mg/dL 3.40(H) 3.20(H) 2.99(H)  BUN/Creat Ratio 12 - 28 - - -  Sodium 135 - 145 mmol/L 135 138 137  Potassium 3.5 - 5.1 mmol/L 4.7 4.1 4.2  Chloride 98 - 111 mmol/L 93(L) 92(L) 92(L)  CO2 22 - 32 mmol/L 30 32 33(H)  Calcium 8.9 - 10.3 mg/dL 9.2 8.8(L) 9.0   CBC Latest Ref Rng & Units 10/15/2018 10/14/2018 10/13/2018  WBC 4.0 - 10.5 K/uL 4.4 3.7(L) 4.0  Hemoglobin 12.0 - 15.0 g/dL 8.4(L) 8.1(L) 8.0(L)  Hematocrit 36.0 - 46.0 % 28.1(L) 26.6(L) 26.4(L)  Platelets 150 - 400 K/uL 144(L) 129(L) 123(L)   Review of Systems  Constitutional: Negative.   HENT: Negative.   Eyes: Negative.   Respiratory: Negative for cough, sputum production and  shortness of breath.   Cardiovascular: Positive for leg swelling. Negative for chest pain, palpitations, orthopnea and claudication.  Gastrointestinal: Negative.   Genitourinary: Negative.   Musculoskeletal: Negative.   Skin: Negative.   Neurological: Negative for dizziness and headaches.       Neuropathic lower extremity pain/numbness   Blood pressure 116/81, pulse 98, temperature 98.2 F (36.8 C), temperature source Oral, resp. rate 18, height 5' 6"  (1.676 m), weight 112.6 kg, SpO2 96 %. Physical Exam  Nursing note and vitals reviewed. Constitutional: She is oriented to person, place, and time. She appears well-developed and well-nourished. No distress.  Morbidly obese woman  HENT:  Head: Normocephalic and atraumatic.  Mouth/Throat: Oropharynx is clear and moist.  Eyes: Pupils are equal, round, and reactive to light. Conjunctivae and EOM are normal. No scleral icterus.  Neck: Normal range of motion. Neck supple. No tracheal deviation present.  Cardiovascular: Normal rate and regular rhythm. Exam reveals no friction rub.  Murmur heard. 3/6 holosystolic murmur at apex.  Respiratory: Effort normal and breath sounds normal. She has no wheezes. She has no rales.  GI: Soft. Bowel sounds are normal. There is no  abdominal tenderness. There is no rebound and no guarding.  Musculoskeletal: Normal range of motion.        General: Edema present.     Comments: Chronic lymphedema-left leg greater than right with 1+ superimposed pitting edema  Neurological: She is alert and oriented to person, place, and time.  Skin: Skin is warm and dry. No erythema.  Chronic lower extremity venous stasis changes/lichenification   Assessment/Plan: 1. Acute kidney injury on chronic kidney disease stage IV: Baseline chronic kidney disease from hypertension/morbid obesity/CHF and recent worsening likely from hemodynamic mechanism/RAS activation with recent escalation of diuretic use for treatment of CHF exacerbation.  Indeed she could have chronic cardiorenal syndrome in the background.  I agree with holding diuretics for the next 24 hours to allow for fluid equilibration and reassessment of labs to decide on dosage/frequency of restarting diuretics.  Clinically, she does not appear to be in extremis with regards to her fluid status and appears to have had improvement of her shortness of breath since admission.  I sensitized her to the gravity of her situation and the possibility of needing extracorporeal fluid removal/chronic dialysis with her baseline renal function in combination of CHF/CKD.  Continue to avoid iodinated intravenous contrast and limit hypotension. 2.  Acute exacerbation of congestive heart failure: With initial response to diuretic therapy which has been held for worsening renal function.  Will reevaluate labs tomorrow to decide on safety to restart diuretics. 3.  Anemia: With recent GI bleed and evidence of significant iron deficiency for which I will start her on intravenous iron. 4.  History of mitral regurgitation and tricuspid regurgitation with mild aortic stenosis.  Management per cardiology. 5.  Atrial fibrillation status post AV node ablation with PPM placement: Currently appears to be rate controlled,  refused anticoagulation. 6.  Recent right pleural effusion status post thoracentesis  Eain Mullendore K. 10/15/2018, 11:46 AM

## 2018-10-15 NOTE — Progress Notes (Signed)
Patient has home BiPAP unit and mask at bedside and is able to apply when she is ready to sleep.

## 2018-10-15 NOTE — Progress Notes (Signed)
PROGRESS NOTE    [name] [name]  TDH:[id number] DOB: 05/30/1946 DOA: 10/06/2018 PCP: [name], MD   Brief Narrative: Patient is a 73 year old female with history of combined systolic/diastolic CHF, A. fib, CKD stage III-4, liver cirrhosis secondary to [place], status post placement of placement after AV node ablation, severe MR, TR, pulmonary hypertension, OSA on CPAP, iron deficiency anemia who presented with increased shortness of breath.  Admitted for CHF exacerbation.  She reported gaining more than 10 pounds before admisison.  Also found to be anemic and an acute kidney injury on CKD.  Cardiology, GI following.  Underwent VCE  and also left-sided thoracentesis with removal of 1.7 liters of fluid on 10/09/18. Was on IV diuresis but held due to worsening kidney function.  Nephrology consulted today.  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 or unspecified chronic kidney disease, with heart failure (HCC) Active Problems:   CKD stage G3b/A1, GFR 30-44 and albumin creatinine ratio <30 mg/g (HCC)   Acute on chronic combined systolic and diastolic CHF (congestive heart failure) (HCC)   Symptomatic anemia   Acute renal failure superimposed on stage 4 chronic kidney disease (HCC)   Hypokalemia   Hyponatremia   Thrombocytopenia (HCC)   Acute on chronic combined systolic and diastolic congestive heart failure, NYHA class 4 (HCC)   Acute on chronic systolic and diastolic heart failure, NYHA class 4 (HCC)   Goals of care, counseling/discussion   Palliative care by specialist   DNR (do not resuscitate) discussion  Acute on chronic combined systolic/diastolic CHF: Was Lasix 646 mg TID,held now.  Plan is to start  on oral torsemide tomorrow.  Continue to monitor input/output. On Imdur.  Not on ACE/ARB or Aldactone due to advanced kidney disease.  Heart failure team following. Echocardiogram done on 10/07/2018 showed ejection fraction of 30 to  35% mid to distal anterior, apical and mid to distal inferior hypokinesis.  Acute on chronic anemia with history of recent GI bleed: Status post 2 units of blood transfusion on 10/08/2018 after her hemoglobin dropped to 6.1.  This morning 8.4 today.  GI was following.  EGD done last hospitalization showed erosive gastritis.  Continue PPI.  Patient declines colonoscopy.  Underwent video capsule endoscopy on 10/09/17 and found to negative for bleeding.  Her anemia could be associated with her chronic kidney disease.  Acute on chronic kidney disease stage III-IV:  Presented with Cardiorenal syndrome.Creatinine up today to 3.4  Likely the effect of over diuresis.Heart failure team following.  Hold IV diuresis.  Avoid nephrotoxic agents.  Follows with Dr. Jimmy Footman as an outpatient. Patient requesting to see a nephrologist.  Right-sided pleural effusion:Secondary to CHF exacerbation.  Underwent ultrasound-guided thoracentesis with goal of 1.7 L of fluid on 10/09/2018  Hypokalemia: Potassium 4.7 today.  Supplementation stopped  Chronic A. fib with history of AV node ablation/PPM: Has refused anticoagulation in the past.  Aspirin also on hold due to severe anemia.  Currently rate is controlled  Hyponatremia:Secondary  to CHF.Resolved  Severe mitral regurgitation: Follows with Dr. Burt Knack.  Was considered for possible mitral clip.  OSA: Continue CPAP  Thrombocytopenia: Chronic.  Continue to monitor.  Deconditioning/debility/multiple comorbidities/poor prognosis: Palliative Care following.  She is DNR.         DVT prophylaxis:SCD Code Status: DNR Family Communication: Discussed with daughter on phone. Disposition Plan: She is from home.  Anticipated discharge to home when she is medically stable.   Consultants: Heart failure, GI  Procedures:  Video capsule endoscopy, thoracentesis  Antimicrobials:None  Subjective: Patient seen and examined the bedside this morning.  Looks comfortable.   Does not complain of any dyspnea.  He is extremely concerned about her worsening kidney function.  Requested nephrology evaluation.  Objective: Vitals:   10/14/18 1216 10/14/18 2034 10/15/18 0508 10/15/18 0843  BP: (!) 108/52 92/60 110/69 116/81  Pulse: (!) 105 (!) 102 96 98  Resp: 19 18 18 18   Temp: (!) 97.4 F (36.3 C) 97.9 F (36.6 C) 98.1 F (36.7 C) 98.2 F (36.8 C)  TempSrc: Oral Oral Oral Oral  SpO2: 94% 98% 99% 96%  Weight:   112.6 kg   Height:        Intake/Output Summary (Last 24 hours) at 10/15/2018 1138 Last data filed at 10/15/2018 1015 Gross per 24 hour  Intake 837 ml  Output 1000 ml  Net -163 ml   Filed Weights   10/14/18 0304 10/14/18 0636 10/15/18 0508  Weight: 111.8 kg 111.4 kg 112.6 kg    Examination:    General exam: Appears calm and comfortable ,obese HEENT:PERRL,Oral mucosa moist, Ear/Nose normal on gross exam Respiratory system: Bilateral decreased air entry on  the bases Cardiovascular system: S1 & S2 heard, RRR. No JVD, murmurs, rubs, gallops or clicks. Gastrointestinal system: Abdomen is nondistended, soft and nontender. No organomegaly or masses felt. Normal bowel sounds heard. Central nervous system: Alert and oriented. No focal neurological deficits. Extremities: 2 + pitting lower extremity edema, no clubbing ,no cyanosis, distal peripheral pulses palpable. Skin: Dark discoloration of bilateral lower extremities from chronic venous stasis     Data Reviewed: I have personally reviewed following labs and imaging studies  CBC: Recent Labs  Lab 10/11/18 0437 10/12/18 0541 10/13/18 0457 10/14/18 0539 10/15/18 0523  WBC 4.3 3.9* 4.0 3.7* 4.4  HGB 7.9* 7.9* 8.0* 8.1* 8.4*  HCT 26.0* 26.2* 26.4* 26.6* 28.1*  MCV 98.1 98.1 97.1 97.4 97.2  PLT 104* 123* 123* 129* 390*   Basic Metabolic Panel: Recent Labs  Lab 10/09/18 0514  10/11/18 0437 10/12/18 0541 10/13/18 0457 10/14/18 0539 10/15/18 0523  NA 135   < > 136 136 137 138 135    K 4.0   < > 3.6 4.1 4.2 4.1 4.7  CL 96*   < > 91* 91* 92* 92* 93*  CO2 30   < > 34* 33* 33* 32 30  GLUCOSE 104*   < > 103* 95 98 102* 100*  BUN 100*   < > 96* 92* 90* 89* 88*  CREATININE 2.99*   < > 2.89* 2.74* 2.99* 3.20* 3.40*  CALCIUM 8.8*   < > 8.9 9.0 9.0 8.8* 9.2  MG 2.4  --   --   --   --   --   --    < > = values in this interval not displayed.   GFR: Estimated Creatinine Clearance: 18.8 mL/min (A) (by C-G formula based on SCr of 3.4 mg/dL (H)). Liver Function Tests: No results for input(s): AST, ALT, ALKPHOS, BILITOT, PROT, ALBUMIN in the last 168 hours. No results for input(s): LIPASE, AMYLASE in the last 168 hours. No results for input(s): AMMONIA in the last 168 hours. Coagulation Profile: No results for input(s): INR, PROTIME in the last 168 hours. Cardiac Enzymes: No results for input(s): CKTOTAL, CKMB, CKMBINDEX, TROPONINI in the last 168 hours. BNP (last 3 results) No results for input(s): PROBNP in the last 8760 hours. HbA1C: No results for input(s): HGBA1C in the last 72 hours.  CBG: No results for input(s): GLUCAP in the last 168 hours. Lipid Profile: No results for input(s): CHOL, HDL, LDLCALC, TRIG, CHOLHDL, LDLDIRECT in the last 72 hours. Thyroid Function Tests: No results for input(s): TSH, T4TOTAL, FREET4, T3FREE, THYROIDAB in the last 72 hours. Anemia Panel: No results for input(s): VITAMINB12, FOLATE, FERRITIN, TIBC, IRON, RETICCTPCT in the last 72 hours. Sepsis Labs: No results for input(s): PROCALCITON, LATICACIDVEN in the last 168 hours.  Recent Results (from the past 240 hour(s))  Urine culture     Status: Abnormal   Collection Time: 10/06/18 12:56 PM  Result Value Ref Range Status   Specimen Description URINE, RANDOM  Final   Special Requests NONE  Final   Culture (A)  Final    <10,000 COLONIES/mL INSIGNIFICANT GROWTH Performed at [facility], [address] [place], [place] [address]    Report Status 10/07/2018 FINAL  Final          Radiology Studies: No results found.      Scheduled Meds: . allopurinol  300 mg Oral QPM  . calcitRIOL  0.25 mcg Oral QODAY  . ferrous sulfate  325 mg Oral QHS  . HYDROcodone-acetaminophen  1 tablet Oral BID  . isosorbide mononitrate  15 mg Oral QHS  . lactose free nutrition  237 mL Oral TID WC  . multivitamin with minerals  1 tablet Oral QHS  . pantoprazole  40 mg Oral BID  . potassium chloride  40 mEq Oral TID  . vitamin B-12  500 mcg Oral Daily   Continuous Infusions: . sodium chloride 1,000 mL (10/12/18 1430)  . ferumoxytol       LOS: 8 days    Time spent: 25 mins.More than 50% of that time was spent in counseling and/or coordination of care.      Shelly Coss, MD [facility] Pager [phone]  If 7PM-7AM, please contact night-coverage [email or web address] Password [facility] 10/15/2018, 11:38 AM

## 2018-10-15 NOTE — Progress Notes (Signed)
Palliative: [name] [name] is sitting up on the edge of the bed.  She makes and keeps eye contact.  We talk about her acute and chronic health concerns.  We talk about cardio renal syndrome. [name] [name] tells me she worries that she will gain fluid since her diuretics are on hold.  We talk about her heart function ((she tells me again that she has 5 things wrong with her heart) and her kidneys (she tells me that her kidney problems started after a ureter blockage).  We talk about HD.  [name] [name] states she will do HD if needed,  "as long as I have my mind".  She tells me her nephew had juvenile diabetes and was on HD, but was found dead. We talk about starting and stopping HD, and that she would be going to a hemodialysis center 3 times a week for 4 hours.  [name] [name] states her hemoglobin is stable at this point, and that she did a PillCam, and does not think she needs a colonoscopy, would do well with a colonoscopy.  I share that her GI doctor in [place] has her on the cancellation list, but she always decides what medical treatment she will accept.  [name] [name] states that every morning when she blows her nose she has a "large blood clot".  I share that this is not likely enough to calls her low hemoglobin.  [name] [name] does at one time endorse that she has been told that it is likely her poor kidney function that disrupts her body making blood.  [name] [name] states that she needs a walker, the one that she has she used when she weighed 400 pounds and it is unstable.  Conference with nursing staff related to [place] discussion and patient needs.  Conference with CM related to patients desire for new walker.   47 minutes  Quinn Axe, NP  Palliative Medicine Team  Team Phone # [phone]  Greater than 50% of this time was spent counseling and coordinating care related to the above assessment and plan.

## 2018-10-15 NOTE — Plan of Care (Signed)
  Problem: Education: Goal: Ability to demonstrate management of disease process will improve Outcome: Progressing   Problem: Education: Goal: Ability to verbalize understanding of medication therapies will improve Outcome: Progressing   

## 2018-10-16 ENCOUNTER — Encounter (HOSPITAL_COMMUNITY): Payer: Medicare Other

## 2018-10-16 LAB — BASIC METABOLIC PANEL
Anion gap: 8 (ref 5–15)
BUN: 85 mg/dL — ABNORMAL HIGH (ref 8–23)
CHLORIDE: 99 mmol/L (ref 98–111)
CO2: 29 mmol/L (ref 22–32)
CREATININE: 3.24 mg/dL — AB (ref 0.44–1.00)
Calcium: 8.9 mg/dL (ref 8.9–10.3)
GFR calc Af Amer: 16 mL/min — ABNORMAL LOW (ref >60)
GFR calc non Af Amer: 13 mL/min — ABNORMAL LOW (ref >60)
Glucose, Bld: 88 mg/dL (ref 70–99)
Potassium: 4.6 mmol/L (ref 3.5–5.1)
Sodium: 136 mmol/L (ref 135–145)

## 2018-10-16 LAB — CBC
HCT: 25.6 % — ABNORMAL LOW (ref 36.0–46.0)
Hemoglobin: 7.5 g/dL — ABNORMAL LOW (ref 12.0–15.0)
MCH: 28.6 pg (ref 26.0–34.0)
MCHC: 29.3 g/dL — ABNORMAL LOW (ref 30.0–36.0)
MCV: 97.7 fL (ref 80.0–100.0)
Platelets: 126 10*3/uL — ABNORMAL LOW (ref 150–400)
RBC: 2.62 MIL/uL — ABNORMAL LOW (ref 3.87–5.11)
RDW: 22.1 % — ABNORMAL HIGH (ref 11.5–15.5)
WBC: 3.6 10*3/uL — ABNORMAL LOW (ref 4.0–10.5)
nRBC: 0 % (ref 0.0–0.2)

## 2018-10-16 LAB — MAGNESIUM: Magnesium: 3.1 mg/dL — ABNORMAL HIGH (ref 1.7–2.4)

## 2018-10-16 MED ORDER — SODIUM CHLORIDE 0.9 % IV SOLN
510.0000 mg | Freq: Once | INTRAVENOUS | Status: AC
  Administered 2018-10-16: 510 mg via INTRAVENOUS
  Filled 2018-10-16: qty 17

## 2018-10-16 MED ORDER — DARBEPOETIN ALFA 60 MCG/0.3ML IJ SOSY
60.0000 ug | PREFILLED_SYRINGE | Freq: Once | INTRAMUSCULAR | Status: DC
  Filled 2018-10-16: qty 0.3

## 2018-10-16 MED ORDER — TORSEMIDE 20 MG PO TABS
80.0000 mg | ORAL_TABLET | Freq: Two times a day (BID) | ORAL | Status: DC
  Administered 2018-10-16 – 2018-10-18 (×5): 80 mg via ORAL
  Filled 2018-10-16 (×5): qty 4

## 2018-10-16 MED ORDER — DARBEPOETIN ALFA 100 MCG/0.5ML IJ SOSY
100.0000 ug | PREFILLED_SYRINGE | Freq: Once | INTRAMUSCULAR | Status: DC
  Filled 2018-10-16: qty 0.5

## 2018-10-16 MED ORDER — DARBEPOETIN ALFA 150 MCG/0.3ML IJ SOSY
150.0000 ug | PREFILLED_SYRINGE | Freq: Once | INTRAMUSCULAR | Status: AC
  Administered 2018-10-17: 150 ug via SUBCUTANEOUS
  Filled 2018-10-16: qty 0.3

## 2018-10-16 NOTE — Progress Notes (Signed)
PROGRESS NOTE    [name] [name]  DGU:[id number] DOB: Oct 24, 1945 DOA: 10/06/2018 PCP: [name], MD   Brief Narrative: Patient is a 73 year old female with history of combined systolic/diastolic CHF, A. fib, CKD stage III-4, liver cirrhosis secondary to [place], status post placement of placement after AV node ablation, severe MR, TR, pulmonary hypertension, OSA on CPAP, iron deficiency anemia who presented with increased shortness of breath.  Admitted for CHF exacerbation.  She reported gaining more than 10 pounds before admisison.  Also found to be anemic and an acute kidney injury on CKD.  Cardiology, GI following.  Underwent video capsule endoscopy for GI bleed but was found to be negative .Also underwent  left-sided thoracentesis with removal of 1.7 liters of fluid on 10/09/18. Was on IV diuresis but held due to worsening kidney function.  Nephrology consulted .  Resumed on torsemide today.  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 or unspecified chronic kidney disease, with heart failure (HCC) Active Problems:   CKD stage G3b/A1, GFR 30-44 and albumin creatinine ratio <30 mg/g (HCC)   Acute on chronic combined systolic and diastolic CHF (congestive heart failure) (HCC)   Symptomatic anemia   Acute renal failure superimposed on stage 4 chronic kidney disease (HCC)   Hypokalemia   Hyponatremia   Thrombocytopenia (HCC)   Acute on chronic combined systolic and diastolic congestive heart failure, NYHA class 4 (HCC)   Acute on chronic systolic and diastolic heart failure, NYHA class 4 (HCC)   Goals of care, counseling/discussion   Palliative care by specialist   DNR (do not resuscitate) discussion  Acute on chronic combined systolic/diastolic CHF: Was Lasix 956 mg TID,held now. Started on torsemide 80 mg twice a day. continue to monitor input/output. On Imdur.  Not on ACE/ARB or Aldactone due to advanced kidney disease.  Heart  failure team following. Echocardiogram done on 10/07/2018 showed ejection fraction of 30 to 35% mid to distal anterior, apical and mid to distal inferior hypokinesis.  Acute on chronic anemia with history of recent GI bleed: Status post 2 units of blood transfusion on 10/08/2018 after her hemoglobin dropped to 6.1.  This morning Hb is 7.5 today.  GI was following.  EGD done last hospitalization showed erosive gastritis.  Continue PPI.  Patient declines colonoscopy.  Underwent video capsule endoscopy on 10/09/17 and found to negative for bleeding.  Her anemia could be associated with her chronic kidney disease.  Nephrology started her on IV iron.  Acute on chronic kidney disease stage III-IV:  Presented with Cardiorenal syndrome.Creatinine  today is 3.2.  Avoid nephrotoxic agents.  Follows with Dr. Jimmy Footman as an outpatient. Neurology following here.  Right-sided pleural effusion:Secondary to CHF exacerbation.  Underwent ultrasound-guided thoracentesis with goal of 1.7 L of fluid on 10/09/2018  Hypokalemia: Potassium 4.6 today.    Chronic A. fib with history of AV node ablation/PPM: Has refused anticoagulation in the past.  Aspirin also on hold due to severe anemia.  Currently rate is controlled  Hyponatremia:Secondary  to CHF.Resolved  Severe mitral regurgitation: Follows with Dr. Burt Knack.  Was considered for possible mitral clip.  OSA: Continue CPAP  Thrombocytopenia: Chronic.  Continue to monitor.  Deconditioning/debility/multiple comorbidities/poor prognosis: Palliative Care following.  She is DNR.         DVT prophylaxis:SCD Code Status: DNR Family Communication: Discussed with daughter on phone. Disposition Plan: She is from home.  Anticipated discharge to home when she is optimized regarding her CHF status and  CKD   Consultants: Heart failure, GI  Procedures: Video capsule endoscopy, thoracentesis  Antimicrobials:None  Subjective: Patient seen and examined the bedside  this morning.  As always, she was sitting at the edge of the bed.  Looks comfortable.  She is not in any kind of respiratory distress.  She is worried about her kidney function.  Objective: Vitals:   10/15/18 0843 10/15/18 1217 10/15/18 2115 10/16/18 0633  BP: 116/81 107/67 (!) 115/98 (!) 102/59  Pulse: 98 96 93 96  Resp: 18  18 18   Temp: 98.2 F (36.8 C) 98 F (36.7 C) 97.7 F (36.5 C) 98.2 F (36.8 C)  TempSrc: Oral Oral Oral Oral  SpO2: 96% 98% 97% 94%  Weight:    112.8 kg  Height:        Intake/Output Summary (Last 24 hours) at 10/16/2018 1127 Last data filed at 10/16/2018 1000 Gross per 24 hour  Intake 240 ml  Output 1300 ml  Net -1060 ml   Filed Weights   10/14/18 0636 10/15/18 0508 10/16/18 7672  Weight: 111.4 kg 112.6 kg 112.8 kg    Examination:    General exam: Appears calm and comfortable ,obese HEENT:PERRL,Oral mucosa moist, Ear/Nose normal on gross exam Respiratory system: Bilateral decreased air entry on  the bases Cardiovascular system: S1 & S2 heard, RRR. No JVD, murmurs, rubs, gallops or clicks. Gastrointestinal system: Abdomen is nondistended, soft and nontender. No organomegaly or masses felt. Normal bowel sounds heard. Central nervous system: Alert and oriented. No focal neurological deficits. Extremities: 2 + pitting lower extremity edema, no clubbing ,no cyanosis, distal peripheral pulses palpable. Skin: Dark discoloration of bilateral lower extremities from chronic venous stasis   Data Reviewed: I have personally reviewed following labs and imaging studies  CBC: Recent Labs  Lab 10/12/18 0541 10/13/18 0457 10/14/18 0539 10/15/18 0523 10/16/18 0503  WBC 3.9* 4.0 3.7* 4.4 3.6*  HGB 7.9* 8.0* 8.1* 8.4* 7.5*  HCT 26.2* 26.4* 26.6* 28.1* 25.6*  MCV 98.1 97.1 97.4 97.2 97.7  PLT 123* 123* 129* 144* 094*   Basic Metabolic Panel: Recent Labs  Lab 10/12/18 0541 10/13/18 0457 10/14/18 0539 10/15/18 0523 10/16/18 0503  NA 136 137 138 135  136  K 4.1 4.2 4.1 4.7 4.6  CL 91* 92* 92* 93* 99  CO2 33* 33* 32 30 29  GLUCOSE 95 98 102* 100* 88  BUN 92* 90* 89* 88* 85*  CREATININE 2.74* 2.99* 3.20* 3.40* 3.24*  CALCIUM 9.0 9.0 8.8* 9.2 8.9   GFR: Estimated Creatinine Clearance: 19.7 mL/min (A) (by C-G formula based on SCr of 3.24 mg/dL (H)). Liver Function Tests: No results for input(s): AST, ALT, ALKPHOS, BILITOT, PROT, ALBUMIN in the last 168 hours. No results for input(s): LIPASE, AMYLASE in the last 168 hours. No results for input(s): AMMONIA in the last 168 hours. Coagulation Profile: No results for input(s): INR, PROTIME in the last 168 hours. Cardiac Enzymes: No results for input(s): CKTOTAL, CKMB, CKMBINDEX, TROPONINI in the last 168 hours. BNP (last 3 results) No results for input(s): PROBNP in the last 8760 hours. HbA1C: No results for input(s): HGBA1C in the last 72 hours. CBG: No results for input(s): GLUCAP in the last 168 hours. Lipid Profile: No results for input(s): CHOL, HDL, LDLCALC, TRIG, CHOLHDL, LDLDIRECT in the last 72 hours. Thyroid Function Tests: No results for input(s): TSH, T4TOTAL, FREET4, T3FREE, THYROIDAB in the last 72 hours. Anemia Panel: No results for input(s): VITAMINB12, FOLATE, FERRITIN, TIBC, IRON, RETICCTPCT in the last 72 hours.  Sepsis Labs: No results for input(s): PROCALCITON, LATICACIDVEN in the last 168 hours.  Recent Results (from the past 240 hour(s))  Urine culture     Status: Abnormal   Collection Time: 10/06/18 12:56 PM  Result Value Ref Range Status   Specimen Description URINE, RANDOM  Final   Special Requests NONE  Final   Culture (A)  Final    <10,000 COLONIES/mL INSIGNIFICANT GROWTH Performed at [facility], [address] [place], [place] [address]    Report Status 10/07/2018 FINAL  Final         Radiology Studies: No results found.      Scheduled Meds: . allopurinol  300 mg Oral QPM  . calcitRIOL  0.25 mcg Oral QODAY  . [START ON  10/17/2018] darbepoetin (ARANESP) injection - NON-DIALYSIS  150 mcg Subcutaneous Once  . ferrous sulfate  325 mg Oral QHS  . HYDROcodone-acetaminophen  1 tablet Oral BID  . isosorbide mononitrate  15 mg Oral QHS  . lactose free nutrition  237 mL Oral TID WC  . multivitamin with minerals  1 tablet Oral QHS  . pantoprazole  40 mg Oral BID  . torsemide  80 mg Oral BID  . vitamin B-12  500 mcg Oral Daily   Continuous Infusions: . sodium chloride 1,000 mL (10/12/18 1430)  . ferumoxytol       LOS: 9 days    Time spent: 25 mins.More than 50% of that time was spent in counseling and/or coordination of care.      Shelly Coss, MD [facility] Pager [phone]  If 7PM-7AM, please contact night-coverage [email or web address] Password [facility] 10/16/2018, 11:27 AM

## 2018-10-16 NOTE — Progress Notes (Signed)
Patient ID: [name] [name], female   DOB: 1945-10-10, 73 y.o.   MRN: [id number] [facility] Progress Note   Assessment/ Plan:   1. Acute kidney injury on chronic kidney disease stage IV: Baseline chronic kidney disease from hypertension/morbid obesity/CHF and recent worsening likely from hemodynamic mechanism/RAS activation with recent escalation of diuretic use for treatment of CHF exacerbation.  Overnight with 1.9 L urine output and net negative but paradoxically with higher weight.  Resume diuretics today with downtrending creatinine. 2.  Acute exacerbation of congestive heart failure: With initial response to diuretic therapy which has been held for worsening renal function.    Restart diuretics today. 3.  Anemia: After initial resistance, agreeable to intravenous iron therapy-we will prescribe Feraheme today and order for ESA tomorrow. 4.  History of mitral regurgitation and tricuspid regurgitation with mild aortic stenosis.  Management per cardiology. 5.  Atrial fibrillation status post AV node ablation with PPM placement: Currently appears to be rate controlled, refused anticoagulation. 6.  Recent right pleural effusion status post thoracentesis  Subjective:   Reports to be feeling better today, denies any shortness of breath.  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   Objective:   BP (!) 102/59 (BP Location: Left Arm)   Pulse 96   Temp 98.2 F (36.8 C) (Oral)   Resp 18   Ht 5' 6"  (1.676 m)   Wt 112.8 kg Comment: scale b  SpO2 94%   BMI 40.13 kg/m   Intake/Output Summary (Last 24 hours) at 10/16/2018 0854 Last data filed at 10/16/2018 0500 Gross per 24 hour  Intake 480 ml  Output 1900 ml  Net -1420 ml   Weight change: 0.181 kg  Physical Exam: Gen: Comfortably sitting up on the edge of her bed CVS: Regular rhythm, normal  rate, S1 and S2 with ejection systolic murmur Resp: Clear to auscultation bilaterally, no rales/rhonchi Abd: Soft, obese, nontender Ext: Chronic lymphedema-asymmetric left lower extremity greater than right with 1+ pitting edema  Imaging: No results found.  Labs: BMET Recent Labs  Lab 10/10/18 0610 10/11/18 0437 10/12/18 0541 10/13/18 0457 10/14/18 0539 10/15/18 0523 10/16/18 0503  NA 136 136 136 137 138 135 136  K 3.7 3.6 4.1 4.2 4.1 4.7 4.6  CL 91* 91* 91* 92* 92* 93* 99  CO2 32 34* 33* 33* 32 30 29  GLUCOSE 110* 103* 95 98 102* 100* 88  BUN 97* 96* 92* 90* 89* 88* 85*  CREATININE 2.99* 2.89* 2.74* 2.99* 3.20* 3.40* 3.24*  CALCIUM 9.1 8.9 9.0 9.0 8.8* 9.2 8.9   CBC Recent Labs  Lab 10/13/18 0457 10/14/18 0539 10/15/18 0523 10/16/18 0503  WBC 4.0 3.7* 4.4 3.6*  HGB 8.0* 8.1* 8.4* 7.5*  HCT 26.4* 26.6* 28.1* 25.6*  MCV 97.1 97.4 97.2 97.7  PLT 123* 129* 144* 126*    Medications:    . allopurinol  300 mg Oral QPM  . calcitRIOL  0.25 mcg Oral QODAY  . [START ON 10/17/2018] darbepoetin (ARANESP) injection - NON-DIALYSIS  150 mcg Subcutaneous Once  . ferrous sulfate  325 mg Oral QHS  . HYDROcodone-acetaminophen  1 tablet Oral BID  . isosorbide mononitrate  15 mg Oral QHS  . lactose free nutrition  237 mL Oral TID WC  . multivitamin with minerals  1 tablet Oral QHS  . pantoprazole  40 mg Oral BID  . torsemide  80 mg Oral BID  . vitamin B-12  500 mcg Oral Daily   Elmarie Shiley, MD 10/16/2018, 8:54 AM

## 2018-10-16 NOTE — Progress Notes (Addendum)
Advanced Heart Failure Rounding Note  PCP-Cardiologist: [name] Carnes, MD   Subjective:    Diuretics stopped 1/20 with rising creatinine. Renal consulted yesterday. Does not need HD at this time. Palliative following.   Weight is up 3 lbs in 2 days. Creatinine trending down 3.40 -> 3.24 today.   Hemoglobin back down 7.5. Refused IV iron.   She had 9 beat and 4 beat runs of NSVT. K 4.6. Added on mag.   Denies orthopnea. SOB at times if she rushes to get to [name]. Denies bleeding. She wants aranesp injections, not IV iron.   Studies:  Echo 10/07/18: EF 30-35% (down from 40-45% 05/2018). Mild AS, mod MR, RV normal function, mod TR,  PA peak pressure 59 mmHg.  Capsule study 1/15 normal. GI has signed off.  R thoracentesis 1/15 with 1.7 L golden yellow fluid.    Objective:   Weight Range: 112.8 kg Body mass index is 40.13 kg/m.   Vital Signs:   Temp:  [97.7 F (36.5 C)-98.2 F (36.8 C)] 98.2 F (36.8 C) (01/22 0633) Pulse Rate:  [93-98] 96 (01/22 0633) Resp:  [18] 18 (01/22 0633) BP: (102-116)/(59-98) 102/59 (01/22 0633) SpO2:  [94 %-98 %] 94 % (01/22 0633) Weight:  [112.8 kg] 112.8 kg (01/22 0633) Last BM Date: 10/14/18  Weight change: Filed Weights   10/14/18 0636 10/15/18 0508 10/16/18 2549  Weight: 111.4 kg 112.6 kg 112.8 kg    Intake/Output:   Intake/Output Summary (Last 24 hours) at 10/16/2018 0756 Last data filed at 10/16/2018 0500 Gross per 24 hour  Intake 480 ml  Output 1900 ml  Net -1420 ml      Physical Exam    General: Obese. Pale. No resp difficulty. HEENT: Normal Neck: Supple. JVP to jaw with prominent v waves. Carotids 2+ bilat; no bruits. No thyromegaly or nodule noted. Cor: PMI nondisplaced. RRR, 2/6 MR/TR Lungs: slightly diminished in RLL Abdomen: Soft, non-tender, non-distended, no HSM. No bruits or masses. +BS  Extremities: No cyanosis, clubbing, or rash. R and LLE 1+ ankle edema.  Neuro: Alert & orientedx3, cranial nerves grossly  intact. moves all 4 extremities w/o difficulty. Affect pleasant  Telemetry   Vpaced 90s. She had 9 beat and 4 beat runs of NSVT. Personally reviewed.   EKG    No new tracings.   Labs    CBC Recent Labs    10/15/18 0523 10/16/18 0503  WBC 4.4 3.6*  HGB 8.4* 7.5*  HCT 28.1* 25.6*  MCV 97.2 97.7  PLT 144* 826*   Basic Metabolic Panel Recent Labs    10/15/18 0523 10/16/18 0503  NA 135 136  K 4.7 4.6  CL 93* 99  CO2 30 29  GLUCOSE 100* 88  BUN 88* 85*  CREATININE 3.40* 3.24*  CALCIUM 9.2 8.9   Liver Function Tests No results for input(s): AST, ALT, ALKPHOS, BILITOT, PROT, ALBUMIN in the last 72 hours. No results for input(s): LIPASE, AMYLASE in the last 72 hours. Cardiac Enzymes No results for input(s): CKTOTAL, CKMB, CKMBINDEX, TROPONINI in the last 72 hours.  BNP: BNP (last 3 results) Recent Labs    06/20/18 2249 10/06/18 1309  BNP 160.6* 214.8*    ProBNP (last 3 results) No results for input(s): PROBNP in the last 8760 hours.   D-Dimer No results for input(s): DDIMER in the last 72 hours. Hemoglobin A1C No results for input(s): HGBA1C in the last 72 hours. Fasting Lipid Panel No results for input(s): CHOL, HDL, LDLCALC, TRIG, CHOLHDL, LDLDIRECT  in the last 72 hours. Thyroid Function Tests No results for input(s): TSH, T4TOTAL, T3FREE, THYROIDAB in the last 72 hours.  Invalid input(s): FREET3  Other results:   Imaging    No results found.   Medications:     Scheduled Medications: . allopurinol  300 mg Oral QPM  . calcitRIOL  0.25 mcg Oral QODAY  . ferrous sulfate  325 mg Oral QHS  . HYDROcodone-acetaminophen  1 tablet Oral BID  . isosorbide mononitrate  15 mg Oral QHS  . lactose free nutrition  237 mL Oral TID WC  . multivitamin with minerals  1 tablet Oral QHS  . pantoprazole  40 mg Oral BID  . vitamin B-12  500 mcg Oral Daily    Infusions: . sodium chloride 1,000 mL (10/12/18 1430)  . ferumoxytol      PRN  Medications: sodium chloride, acetaminophen, diphenhydrAMINE, docusate sodium, methocarbamol, ondansetron (ZOFRAN) IV, zolpidem    Patient Profile   [name] [name] a 73 y.o.femalewith a history of morbid obesity, afib s/p AV ablation andSt JudePPM, chronic combined HF, CKD IV, severe MR, TR, pulmonary HTN, OSA on BiPAP, GERD, gout, and iron deficiency anemia.Cardiac cath 2009 with normal coronary arteries.   Admitted with SOB and weight gain.   Assessment/Plan   1. Acute on chronic combined HF: Echo 06/21/18 with EF 40-45%, LV severely dilated, mild LVH, akinesis of mid-apicalanteroseptal and apical myocardium, mild AS, severe MR, LA and RA severely dilated, moderate TR, PA peak pressure 42 mmHg. Cause of cardiomyopathy uncertain. Could be valvular, could be due to chronic RV pacing (has not had coronary angiography that I can see and now with CKD stage IV). [place] 10/4 showed preserved cardiac output with markedly elevated right and left heart filling pressures (very prominent RV failure). TEE looked like restrictive cardiomyopathy. - Volume status trending up. Weight trending back up, but still 10 lbs from admit weight.  - Creatinine improving. Resume torsemide 80 mg BID (home dose) today.  - She was taken off hydralazine due to soft BP. Continue imdur 15 mg daily  - No bblocker yet  - No spiro/ARB/ACEI/ARNI with CKD stage IV.  - Refuses UNNA boots or TED hose.  - Echo 10/07/18: EF 30-35% (down from 40-45% 05/2018). Mild AS, mod MR, RV normal function, mod TR,  PA peak pressure 59 mmHg.  - Palliative following. She is now a DNR/DNI.   2. Mitral regurgitation: Severe MR on echo9/27/19 (mild on echo 10/2016). TEE on 10/4 showed moderate-severe central MR with some restriction of posterior leaflet. Tricuspid regurgitation was more impressive.  - She was evaluated by Dr [name] [name] 10/16 for possible mitral clip. There was concern mitral clip may not make a difference due to  multiple co-morbidities including RV failure with severe TR. - Continue medical therapy. MR moderate on echo this admit. No change.  3. Afib s/p AV nodal ablation and PPM: Chronic RV pacing in the 90-100s. - With dyssynchrony and decreased LV EF, could consider CRT upgrade at some point but doubt that will improve current situation - She refuses anticoagulation ("caused my teeth to fall out and my hair to fall out"). She will only take ASA. ASA held with anemia. No change.  4. Anemia/Recent GIB. - Hemoglobin back down 7.5 today. Denies bleeding.  - GI consulted last admission. She had an EGD that showed erosive gastritis. PPI increased.  - Had capsule study 1/15, which was normal. GI has signed off.  - LDH 447. ?hemolysis off valves. - Likely  needs Aranesp. Discussed with PharmD. We ordered IV iron, but she refused. Renal consulted and agreed with IV iron. We discussed today. She does not want IV iron. She wants aranesp injections. I will discuss further with MD. 5. OSA: Continue BiPAP qHS. No change.  6. CKD Stage 4: Baseline creatinine around 2.5. - Creatinine 2.94-> 3.03 -> 2.99 -> 2.99 -> 2.89 -> 2.74 -> 2.99 -> 3.20 -> 3.4 -> 3.24 today. Renal consulted yesterday. Does not need HD at this point.  7. Tricuspid regurgitation: -Severe TR on TEE 05/2018. Moderate TR on echo this admit (severe per Dr Haroldine Laws). No change. 8. Mild aortic stenosis - Mild on echo this admit. No change.  9. Hypokalemia - K 4.6 10. Right pleural effusion - S/p thoracentesis 1/16 with 1.7 L golden yellow fluid off. No change.  12. NSVT - K 4.6. Added on mag.   Medication concerns reviewed with patient and pharmacy team. Barriers identified: none at this time.   Length of Stay: [address], NP  10/16/2018, 7:56 AM  Advanced Heart Failure Team Pager [phone] (M-F; [place])  Please contact [facility] for night-coverage after hours (4p -7a ) and weekends on [email or web address]  Patient seen and  examined with the above-signed Advanced Practice Provider and/or Housestaff. I personally reviewed laboratory data, imaging studies and relevant notes. I independently examined the patient and formulated the important aspects of the plan. I have edited the note to reflect any of my changes or salient points. I have personally discussed the plan with the patient and/or family.  She remains weak but denies SOB. Very anxious about her medicine regimen. Diuretics remain on hold and renal function now starting to improve. JVP remains elevated but I do not think we can diurese her any more given her cardiorenal syndrome. Will start back on home torsemide dose and follow. She is receiving IV iron and Aranesp for her anemia per Renal today. Hgb drifting back down. May need another unit or two of RBCs prior to d/c. I discussed her situation with her daughter today by telephone. Continue to refuse AC.   Glori Bickers, MD  3:54 PM

## 2018-10-17 LAB — BASIC METABOLIC PANEL
Anion gap: 12 (ref 5–15)
BUN: 79 mg/dL — ABNORMAL HIGH (ref 8–23)
CO2: 27 mmol/L (ref 22–32)
Calcium: 8.9 mg/dL (ref 8.9–10.3)
Chloride: 97 mmol/L — ABNORMAL LOW (ref 98–111)
Creatinine, Ser: 2.92 mg/dL — ABNORMAL HIGH (ref 0.44–1.00)
GFR calc Af Amer: 18 mL/min — ABNORMAL LOW (ref >60)
GFR calc non Af Amer: 15 mL/min — ABNORMAL LOW (ref >60)
Glucose, Bld: 86 mg/dL (ref 70–99)
POTASSIUM: 3.2 mmol/L — AB (ref 3.5–5.1)
SODIUM: 136 mmol/L (ref 135–145)

## 2018-10-17 LAB — CBC
HCT: 27.4 % — ABNORMAL LOW (ref 36.0–46.0)
Hemoglobin: 8.1 g/dL — ABNORMAL LOW (ref 12.0–15.0)
MCH: 28.8 pg (ref 26.0–34.0)
MCHC: 29.6 g/dL — ABNORMAL LOW (ref 30.0–36.0)
MCV: 97.5 fL (ref 80.0–100.0)
Platelets: 158 10*3/uL (ref 150–400)
RBC: 2.81 MIL/uL — ABNORMAL LOW (ref 3.87–5.11)
RDW: 21.6 % — ABNORMAL HIGH (ref 11.5–15.5)
WBC: 4.1 10*3/uL (ref 4.0–10.5)
nRBC: 0 % (ref 0.0–0.2)

## 2018-10-17 LAB — CREATININE, URINE, RANDOM: Creatinine, Urine: 27.43 mg/dL

## 2018-10-17 LAB — SODIUM, URINE, RANDOM: SODIUM UR: 67 mmol/L

## 2018-10-17 MED ORDER — POTASSIUM CHLORIDE CRYS ER 20 MEQ PO TBCR
40.0000 meq | EXTENDED_RELEASE_TABLET | Freq: Two times a day (BID) | ORAL | Status: DC
  Administered 2018-10-17 – 2018-10-18 (×3): 40 meq via ORAL
  Filled 2018-10-17 (×3): qty 2

## 2018-10-17 MED ORDER — POTASSIUM CHLORIDE CRYS ER 20 MEQ PO TBCR: 40.0000 meq | EXTENDED_RELEASE_TABLET | Freq: Once | ORAL | Status: DC

## 2018-10-17 NOTE — Progress Notes (Addendum)
Rolling walker ordered as requested and to be delivered to the room prior to discharging home. Patient is refusing all [facility] services at this time; Aneta Mins [phone]

## 2018-10-17 NOTE — Plan of Care (Signed)
  Problem: Activity: Goal: Capacity to carry out activities will improve Outcome: Progressing   Problem: Cardiac: Goal: Ability to achieve and maintain adequate cardiopulmonary perfusion will improve Outcome: Progressing   Problem: Health Behavior/Discharge Planning: Goal: Ability to manage health-related needs will improve Outcome: Progressing   Problem: Activity: Goal: Risk for activity intolerance will decrease Outcome: Progressing   Problem: Nutrition: Goal: Adequate nutrition will be maintained Outcome: Progressing

## 2018-10-17 NOTE — Progress Notes (Signed)
Patient has home Bipap unit and placed herself on home unit.

## 2018-10-17 NOTE — Progress Notes (Addendum)
Advanced Heart Failure Rounding Note  PCP-Cardiologist: Dorris Carnes, MD   Subjective:    Torsemide resumed yesterday. Good diuresis. Net negative 1.7 L. Creatinine improving 3.24 -> 2.92. Weight unchanged. K 3.2  She received IV iron and will get aranesp today. Hemoglobin better 8.1  Denies SOB or dizziness. Denies bleeding.   Studies:  Echo 10/07/18: EF 30-35% (down from 40-45% 05/2018). Mild AS, mod MR, RV normal function, mod TR,  PA peak pressure 59 mmHg.  Capsule study 1/15 normal. GI has signed off.  R thoracentesis 1/15 with 1.7 L golden yellow fluid.    Objective:   Weight Range: 112.6 kg Body mass index is 40.08 kg/m.   Vital Signs:   Temp:  [97.6 F (36.4 C)-98.2 F (36.8 C)] 98.2 F (36.8 C) (01/23 0454) Pulse Rate:  [90-103] 100 (01/23 0454) Resp:  [16-20] 20 (01/22 2059) BP: (98-125)/(58-71) 117/71 (01/23 0454) SpO2:  [94 %-98 %] 94 % (01/23 0454) Weight:  [112.6 kg] 112.6 kg (01/23 0454) Last BM Date: 10/15/18  Weight change: Filed Weights   10/15/18 0508 10/16/18 0633 10/17/18 0454  Weight: 112.6 kg 112.8 kg 112.6 kg    Intake/Output:   Intake/Output Summary (Last 24 hours) at 10/17/2018 0801 Last data filed at 10/17/2018 0600 Gross per 24 hour  Intake 840 ml  Output 2600 ml  Net -1760 ml      Physical Exam    General: Obese. No resp difficulty. HEENT: Normal Neck: Supple. JVP to jaw with prominent v waves. Carotids 2+ bilat; no bruits. No thyromegaly or nodule noted. Cor: PMI nondisplaced. RRR, 2/6 MR/TR Lungs: CTAB Abdomen: Soft, non-tender, non-distended, no HSM. No bruits or masses. +BS  Extremities: No cyanosis, clubbing, or rash. R and LLE 1+ ankle edema Neuro: Alert & orientedx3, cranial nerves grossly intact. moves all 4 extremities w/o difficulty. Affect pleasant  Telemetry   Vpaced 90-100s. 7 beats NSVT. Personally reviewed.   EKG    No new tracings.   Labs    CBC Recent Labs    10/16/18 0503 10/17/18 0521  WBC  3.6* 4.1  HGB 7.5* 8.1*  HCT 25.6* 27.4*  MCV 97.7 97.5  PLT 126* 016   Basic Metabolic Panel Recent Labs    10/16/18 0503 10/17/18 0521  NA 136 136  K 4.6 3.2*  CL 99 97*  CO2 29 27  GLUCOSE 88 86  BUN 85* 79*  CREATININE 3.24* 2.92*  CALCIUM 8.9 8.9  MG 3.1*  --    Liver Function Tests No results for input(s): AST, ALT, ALKPHOS, BILITOT, PROT, ALBUMIN in the last 72 hours. No results for input(s): LIPASE, AMYLASE in the last 72 hours. Cardiac Enzymes No results for input(s): CKTOTAL, CKMB, CKMBINDEX, TROPONINI in the last 72 hours.  BNP: BNP (last 3 results) Recent Labs    06/20/18 2249 10/06/18 1309  BNP 160.6* 214.8*    ProBNP (last 3 results) No results for input(s): PROBNP in the last 8760 hours.   D-Dimer No results for input(s): DDIMER in the last 72 hours. Hemoglobin A1C No results for input(s): HGBA1C in the last 72 hours. Fasting Lipid Panel No results for input(s): CHOL, HDL, LDLCALC, TRIG, CHOLHDL, LDLDIRECT in the last 72 hours. Thyroid Function Tests No results for input(s): TSH, T4TOTAL, T3FREE, THYROIDAB in the last 72 hours.  Invalid input(s): FREET3  Other results:   Imaging    No results found.   Medications:     Scheduled Medications: . allopurinol  300 mg Oral  QPM  . calcitRIOL  0.25 mcg Oral QODAY  . darbepoetin (ARANESP) injection - NON-DIALYSIS  150 mcg Subcutaneous Once  . ferrous sulfate  325 mg Oral QHS  . HYDROcodone-acetaminophen  1 tablet Oral BID  . isosorbide mononitrate  15 mg Oral QHS  . lactose free nutrition  237 mL Oral TID WC  . multivitamin with minerals  1 tablet Oral QHS  . pantoprazole  40 mg Oral BID  . potassium chloride  40 mEq Oral BID  . torsemide  80 mg Oral BID  . vitamin B-12  500 mcg Oral Daily    Infusions: . sodium chloride 1,000 mL (10/12/18 1430)    PRN Medications: sodium chloride, acetaminophen, diphenhydrAMINE, docusate sodium, methocarbamol, ondansetron (ZOFRAN) IV,  zolpidem    Patient Profile   [name] Beth Sargentis a 73 y.o.femalewith a history of morbid obesity, afib s/p AV ablation andSt JudePPM, chronic combined HF, CKD IV, severe MR, TR, pulmonary HTN, OSA on BiPAP, GERD, gout, and iron deficiency anemia.Cardiac cath 2009 with normal coronary arteries.   Admitted with SOB and weight gain.   Assessment/Plan   1. Acute on chronic combined HF: Echo 06/21/18 with EF 40-45%, LV severely dilated, mild LVH, akinesis of mid-apicalanteroseptal and apical myocardium, mild AS, severe MR, LA and RA severely dilated, moderate TR, PA peak pressure 42 mmHg. Cause of cardiomyopathy uncertain. Could be valvular, could be due to chronic RV pacing (has not had coronary angiography that I can see and now with CKD stage IV). [place] 10/4 showed preserved cardiac output with markedly elevated right and left heart filling pressures (very prominent RV failure). TEE looked like restrictive cardiomyopathy. - Volume status okay. Weight trending back up, but still 10 lbs from admit weight.  - Continue torsemide 80 mg BID (home dose) today. Creatinine improving 2.92 today.  - She was taken off hydralazine due to soft BP. Continue imdur 15 mg daily  - No bblocker yet  - No spiro/ARB/ACEI/ARNI with CKD stage IV.  - Refuses UNNA boots or TED hose.  - Echo 10/07/18: EF 30-35% (down from 40-45% 05/2018). Mild AS, mod MR, RV normal function, mod TR,  PA peak pressure 59 mmHg.  - Palliative following. She is now a DNR/DNI.   2. Mitral regurgitation: Severe MR on echo9/27/19 (mild on echo 10/2016). TEE on 10/4 showed moderate-severe central MR with some restriction of posterior leaflet. Tricuspid regurgitation was more impressive.  - She was evaluated by Dr Lorenza Chick 10/16 for possible mitral clip. There was concern mitral clip may not make a difference due to multiple co-morbidities including RV failure with severe TR. - Continue medical therapy. MR moderate on echo this  admit. No change. 3. Afib s/p AV nodal ablation and PPM: Chronic RV pacing in the 90-100s. - With dyssynchrony and decreased LV EF, could consider CRT upgrade at some point but doubt that will improve current situation - She refuses anticoagulation ("caused my teeth to fall out and my hair to fall out"). She will only take ASA. ASA held with anemia. No change.  4. Anemia/Recent GIB. - Hemoglobin improved 8.1 today - GI consulted last admission. She had an EGD that showed erosive gastritis. PPI increased.  - Had capsule study 1/15, which was normal. GI has signed off.  - LDH 447. ?hemolysis off valves. - Received IV iron yesterday. Will start aranesp injections today.  5. OSA: Continue BiPAP qHS. No change.  6. CKD Stage 4: Baseline creatinine around 2.5. - Creatinine 2.94-> 3.03 -> 2.99 ->  2.99 -> 2.89 -> 2.74 -> 2.99 -> 3.20 -> 3.4 -> 3.24 -> 2.92. PO diuretics resumed yesterday. Nephrology following.  - She will get first dose of aranesp today. 7. Tricuspid regurgitation: -Severe TR on TEE 05/2018. Moderate TR on echo this admit (severe per Dr Haroldine Laws). No change.  8. Mild aortic stenosis - Mild on echo this admit. No change.  9. Hypokalemia - K 3.2. Supp restarted today.  10. Right pleural effusion - S/p thoracentesis 1/16 with 1.7 L golden yellow fluid off. No change.  12. NSVT - K 3.2 this am. Supp. Mag 3.1 yesterday.   Medication concerns reviewed with patient and pharmacy team. Barriers identified: none at this time.   She is close to being ready for DC from HF standpoint. She wants to stay one more day. She has HF f/u.   Length of Stay: [place], NP  10/17/2018, 8:01 AM  Advanced Heart Failure Team Pager [phone] (M-F; 7a - 4p)  Please contact [facility] for night-coverage after hours (4p -7a ) and weekends on [email or web address]  Patient seen and examined with the above-signed Advanced Practice Provider and/or Housestaff. I personally reviewed laboratory data,  imaging studies and relevant notes. I independently examined the patient and formulated the important aspects of the plan. I have edited the note to reflect any of my changes or salient points. I have personally discussed the plan with the patient and/or family.  Overall stable. Renal function improved. Weight stable on po torsemide.Can go home tomorrow on current meds (including torsemide 80 bid). Supp K. We will sign off. We will arrange f/u in HF Clinic.   Glori Bickers, MD  2:53 PM

## 2018-10-17 NOTE — Progress Notes (Signed)
Patient ID: [name] [name], female   DOB: [birth year], 73 y.o.   MRN: [id number]  [facility] Progress Note   Assessment/ Plan:   1. Acute kidney injury on chronic kidney disease stage IV: Acute kidney injury was hemodynamically mediated in the setting of CHF exacerbation with diuretic use-now heading towards improvement after transient diuretic holiday with improvement of urine output/volume status.  Hypokalemia secondary to diuresis. 2.  Acute exacerbation of congestive heart failure: Continues to show improvement of volume status and per patient, back to baseline.  Restarted back on oral diuretics that will be used as her outpatient regimen. 3.  Anemia: Secondary to chronic kidney disease and scheduled to get ESA today after intravenous iron yesterday.  To follow-up as an outpatient. 4.  History of mitral regurgitation and tricuspid regurgitation with mild aortic stenosis.  Management per cardiology. 5.  Atrial fibrillation status post AV node ablation with PPM placement: Currently appears to be rate controlled, refused anticoagulation. 6.    Hypokalemia: Secondary to diuretic induced losses, replace via oral route  Will sign off at this time, please call with questions.  She will follow-up with Dr. Jimmy Footman as scheduled.  Subjective:   Reports to be feeling better today, states that her breathing is back to baseline with minimal exertional dyspnea.   Objective:   BP 117/71 (BP Location: Left Wrist)   Pulse 100   Temp 98.2 F (36.8 C) (Oral)   Resp 20   Ht 5' 6"  (1.676 m)   Wt 112.6 kg   SpO2 94%   BMI 40.08 kg/m   Intake/Output Summary (Last 24 hours) at 10/17/2018 0910 Last data filed at 10/17/2018 0600 Gross per 24 hour  Intake 840 ml  Output 2600 ml  Net -1760 ml   Weight change: -0.136 kg  Physical Exam: Gen: Comfortably sitting up on the edge of her bed CVS: Regular rhythm, normal rate, S1 and S2 with ejection systolic murmur Resp: Clear to auscultation  bilaterally, no rales/rhonchi Abd: Soft, obese, nontender Ext: Chronic lymphedema-asymmetric left lower extremity greater than right with 1+ pitting edema  Imaging: No results found.  Labs: BMET Recent Labs  Lab 10/11/18 0437 10/12/18 0541 10/13/18 0457 10/14/18 0539 10/15/18 0523 10/16/18 0503 10/17/18 0521  NA 136 136 137 138 135 136 136  K 3.6 4.1 4.2 4.1 4.7 4.6 3.2*  CL 91* 91* 92* 92* 93* 99 97*  CO2 34* 33* 33* 32 30 29 27   GLUCOSE 103* 95 98 102* 100* 88 86  BUN 96* 92* 90* 89* 88* 85* 79*  CREATININE 2.89* 2.74* 2.99* 3.20* 3.40* 3.24* 2.92*  CALCIUM 8.9 9.0 9.0 8.8* 9.2 8.9 8.9   CBC Recent Labs  Lab 10/14/18 0539 10/15/18 0523 10/16/18 0503 10/17/18 0521  WBC 3.7* 4.4 3.6* 4.1  HGB 8.1* 8.4* 7.5* 8.1*  HCT 26.6* 28.1* 25.6* 27.4*  MCV 97.4 97.2 97.7 97.5  PLT 129* 144* 126* 158    Medications:    . allopurinol  300 mg Oral QPM  . calcitRIOL  0.25 mcg Oral QODAY  . darbepoetin (ARANESP) injection - NON-DIALYSIS  150 mcg Subcutaneous Once  . ferrous sulfate  325 mg Oral QHS  . HYDROcodone-acetaminophen  1 tablet Oral BID  . isosorbide mononitrate  15 mg Oral QHS  . lactose free nutrition  237 mL Oral TID WC  . multivitamin with minerals  1 tablet Oral QHS  . pantoprazole  40 mg Oral BID  . potassium chloride  40 mEq Oral BID  .  torsemide  80 mg Oral BID  . vitamin B-12  500 mcg Oral Daily   Elmarie Shiley, MD 10/17/2018, 9:10 AM

## 2018-10-17 NOTE — Care Management Important Message (Signed)
Important Message  Patient Details  Name: [name] [name] MRN: [id number] Date of Birth: [birth year]   Medicare Important Message Given:  Yes    [name] P [place] 10/17/2018, 11:12 AM

## 2018-10-17 NOTE — Progress Notes (Signed)
PROGRESS NOTE    [name] [name]  NGE:[id number] DOB: [birth year] DOA: 10/06/2018 PCP: [name], MD   Brief Narrative: Patient is a 73 year old female with history of combined systolic/diastolic CHF, A. fib, CKD stage III-4, liver cirrhosis secondary to [place], status post placement of placement after AV node ablation, severe MR, TR, pulmonary hypertension, OSA on CPAP, iron deficiency anemia who presented with increased shortness of breath.  Admitted for CHF exacerbation.  She reported gaining more than 10 pounds before admisison.  Also found to be anemic and an acute kidney injury on CKD.  Cardiology, GI following.  Underwent video capsule endoscopy for GI bleed but was found to be negative .Also underwent  left-sided thoracentesis with removal of 1.7 liters of fluid on 10/09/18. Was on IV diuresis but held due to worsening kidney function. Resumed on torsemide .Plan for discharge tomorrow.  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 or unspecified chronic kidney disease, with heart failure (HCC) Active Problems:   CKD stage G3b/A1, GFR 30-44 and albumin creatinine ratio <30 mg/g (HCC)   Acute on chronic combined systolic and diastolic CHF (congestive heart failure) (HCC)   Symptomatic anemia   Acute renal failure superimposed on stage 4 chronic kidney disease (HCC)   Hypokalemia   Hyponatremia   Thrombocytopenia (HCC)   Acute on chronic combined systolic and diastolic congestive heart failure, NYHA class 4 (HCC)   Acute on chronic systolic and diastolic heart failure, NYHA class 4 (HCC)   Goals of care, counseling/discussion   Palliative care by specialist   DNR (do not resuscitate) discussion  Acute on chronic combined systolic/diastolic CHF: Was Lasix 401 mg TID,held now. Started on torsemide 80 mg twice a day. continue to monitor input/output. On Imdur.  Not on ACE/ARB or Aldactone due to advanced kidney disease.  Heart failure  team following. Echocardiogram done on 10/07/2018 showed ejection fraction of 30 to 35% mid to distal anterior, apical and mid to distal inferior hypokinesis.  Acute on chronic anemia with history of recent GI bleed: Status post 2 units of blood transfusion on 10/08/2018 after her hemoglobin dropped to 6.1.  This morning Hb is 8.1 today.  GI was following.  EGD done last hospitalization showed erosive gastritis.  Continue PPI.  Patient declines colonoscopy.  Underwent video capsule endoscopy on 10/09/17 and found to negative for bleeding.  Her anemia could be associated with her chronic kidney disease.  Was given  IV iron yesterday.Plan for giving ESA today  Acute on chronic kidney disease stage III-IV:  Presented with Cardiorenal syndrome.Creatinine  today is 2.9.  Avoid nephrotoxic agents.  Follows with Dr. Jimmy Footman as an outpatient. Neurology was following here.  Right-sided pleural effusion:Secondary to CHF exacerbation.  Underwent ultrasound-guided thoracentesis with goal of 1.7 L of fluid on 10/09/2018  Hypokalemia: Potassium 3.2 today.  Will supplement.  Chronic A. fib with history of AV node ablation/PPM: Has refused anticoagulation in the past.  Aspirin also on hold due to severe anemia.  Currently rate is controlled  Hyponatremia:Secondary  to CHF.Resolved  Severe mitral regurgitation: Follows with Dr. Burt Knack.  Was considered for possible mitral clip.  OSA: Continue CPAP  Thrombocytopenia: Chronic.  Continue to monitor.  Deconditioning/debility/multiple comorbidities/poor prognosis: Palliative Care following.  She is DNR.         DVT prophylaxis:SCD Code Status: DNR Family Communication: Discussed with daughter on phone. Disposition Plan: She is from home.  Anticipated discharge to home when she is optimized regarding  her CHF status and CKD   Consultants: Heart failure, GI  Procedures: Video capsule endoscopy, thoracentesis  Antimicrobials:None  Subjective: Patient  seen and examined at bedside this morning.  Remains comfortable.  Was sleeping.  Denies any chest pain or shortness of breath.  Objective: Vitals:   10/16/18 1255 10/16/18 2059 10/17/18 0454 10/17/18 1003  BP: 125/65 (!) 98/58 117/71 118/67  Pulse: (!) 103 90 100 92  Resp: 16 20    Temp: 97.6 F (36.4 C) 98.1 F (36.7 C) 98.2 F (36.8 C)   TempSrc: Oral Oral Oral   SpO2:  98% 94%   Weight:   112.6 kg   Height:        Intake/Output Summary (Last 24 hours) at 10/17/2018 1038 Last data filed at 10/17/2018 0600 Gross per 24 hour  Intake 600 ml  Output 2600 ml  Net -2000 ml   Filed Weights   10/15/18 0508 10/16/18 0633 10/17/18 0454  Weight: 112.6 kg 112.8 kg 112.6 kg    Examination:  General exam: Appears calm and comfortable , morbidly obese  HEENT:PERRL,Oral mucosa moist, Ear/Nose normal on gross exam Respiratory system: Bilateral decreased air entry on the bases Cardiovascular system: S1 & S2 heard, RRR. No JVD, murmurs, rubs, gallops or clicks. Gastrointestinal system: Abdomen is nondistended, soft and nontender. No organomegaly or masses felt. Normal bowel sounds heard. Central nervous system: Alert and oriented. No focal neurological deficits. Extremities: Bilateral lower extremity edema, no clubbing ,no cyanosis, distal peripheral pulses palpable. Skin: Dark discoloration of bilateral lower extremities from chronic venous stasis  Data Reviewed: I have personally reviewed following labs and imaging studies  CBC: Recent Labs  Lab 10/13/18 0457 10/14/18 0539 10/15/18 0523 10/16/18 0503 10/17/18 0521  WBC 4.0 3.7* 4.4 3.6* 4.1  HGB 8.0* 8.1* 8.4* 7.5* 8.1*  HCT 26.4* 26.6* 28.1* 25.6* 27.4*  MCV 97.1 97.4 97.2 97.7 97.5  PLT 123* 129* 144* 126* 828   Basic Metabolic Panel: Recent Labs  Lab 10/13/18 0457 10/14/18 0539 10/15/18 0523 10/16/18 0503 10/17/18 0521  NA 137 138 135 136 136  K 4.2 4.1 4.7 4.6 3.2*  CL 92* 92* 93* 99 97*  CO2 33* 32 30 29 27     GLUCOSE 98 102* 100* 88 86  BUN 90* 89* 88* 85* 79*  CREATININE 2.99* 3.20* 3.40* 3.24* 2.92*  CALCIUM 9.0 8.8* 9.2 8.9 8.9  MG  --   --   --  3.1*  --    GFR: Estimated Creatinine Clearance: 21.8 mL/min (A) (by C-G formula based on SCr of 2.92 mg/dL (H)). Liver Function Tests: No results for input(s): AST, ALT, ALKPHOS, BILITOT, PROT, ALBUMIN in the last 168 hours. No results for input(s): LIPASE, AMYLASE in the last 168 hours. No results for input(s): AMMONIA in the last 168 hours. Coagulation Profile: No results for input(s): INR, PROTIME in the last 168 hours. Cardiac Enzymes: No results for input(s): CKTOTAL, CKMB, CKMBINDEX, TROPONINI in the last 168 hours. BNP (last 3 results) No results for input(s): PROBNP in the last 8760 hours. HbA1C: No results for input(s): HGBA1C in the last 72 hours. CBG: No results for input(s): GLUCAP in the last 168 hours. Lipid Profile: No results for input(s): CHOL, HDL, LDLCALC, TRIG, CHOLHDL, LDLDIRECT in the last 72 hours. Thyroid Function Tests: No results for input(s): TSH, T4TOTAL, FREET4, T3FREE, THYROIDAB in the last 72 hours. Anemia Panel: No results for input(s): VITAMINB12, FOLATE, FERRITIN, TIBC, IRON, RETICCTPCT in the last 72 hours. Sepsis Labs: No results  for input(s): PROCALCITON, LATICACIDVEN in the last 168 hours.  No results found for this or any previous visit (from the past 240 hour(s)).       Radiology Studies: No results found.      Scheduled Meds: . allopurinol  300 mg Oral QPM  . calcitRIOL  0.25 mcg Oral QODAY  . ferrous sulfate  325 mg Oral QHS  . HYDROcodone-acetaminophen  1 tablet Oral BID  . isosorbide mononitrate  15 mg Oral QHS  . lactose free nutrition  237 mL Oral TID WC  . multivitamin with minerals  1 tablet Oral QHS  . pantoprazole  40 mg Oral BID  . potassium chloride  40 mEq Oral BID  . torsemide  80 mg Oral BID  . vitamin B-12  500 mcg Oral Daily   Continuous Infusions: . sodium  chloride 1,000 mL (10/12/18 1430)     LOS: 10 days    Time spent: 25 mins.More than 50% of that time was spent in counseling and/or coordination of care.      Shelly Coss, MD [facility] Pager [phone]  If 7PM-7AM, please contact night-coverage [email or web address] Password [facility] 10/17/2018, 10:38 AM

## 2018-10-18 LAB — BASIC METABOLIC PANEL
Anion gap: 12 (ref 5–15)
BUN: 72 mg/dL — ABNORMAL HIGH (ref 8–23)
CHLORIDE: 94 mmol/L — AB (ref 98–111)
CO2: 29 mmol/L (ref 22–32)
Calcium: 9.2 mg/dL (ref 8.9–10.3)
Creatinine, Ser: 2.81 mg/dL — ABNORMAL HIGH (ref 0.44–1.00)
GFR calc non Af Amer: 16 mL/min — ABNORMAL LOW (ref >60)
GFR, EST AFRICAN AMERICAN: 19 mL/min — AB (ref >60)
Glucose, Bld: 91 mg/dL (ref 70–99)
Potassium: 3.1 mmol/L — ABNORMAL LOW (ref 3.5–5.1)
Sodium: 135 mmol/L (ref 135–145)

## 2018-10-18 MED ORDER — POTASSIUM CHLORIDE CRYS ER 20 MEQ PO TBCR: EXTENDED_RELEASE_TABLET | ORAL | 3 refills | Status: DC

## 2018-10-18 MED ORDER — POTASSIUM CHLORIDE CRYS ER 20 MEQ PO TBCR: 40.0000 meq | EXTENDED_RELEASE_TABLET | Freq: Two times a day (BID) | ORAL | 0 refills | Status: DC

## 2018-10-18 MED ORDER — METHOCARBAMOL 500 MG PO TABS: 500.0000 mg | ORAL_TABLET | Freq: Four times a day (QID) | ORAL | 0 refills | Status: DC | PRN

## 2018-10-18 NOTE — Discharge Summary (Signed)
Physician Discharge Summary  [name] [name]:[id number] DOB: 10/22/1945 DOA: 10/06/2018  PCP: [name], MD  Admit date: 10/06/2018 Discharge date: 10/18/2018  Admitted From: Home Disposition:  Home  Discharge Condition:Stable CODE STATUS:FULL, DNR, Comfort Care Diet recommendation: Heart Healthy / Carb Modified / Regular / Dysphagia   Brief/Interim Summary:  Patient is a 73 year old female with history of combined systolic/diastolic CHF, A. fib, CKD stage III-4, liver cirrhosis secondary to [place], status post placement of placement after AV node ablation, severe MR, TR, pulmonary hypertension, OSA on CPAP, iron deficiency anemia who presented with increased shortness of breath.  Admitted for CHF exacerbation.  She reported gaining more than 10 pounds before admisison.  Also found to be anemic and an acute kidney injury on CKD.  Cardiology, GI were following.  She also underwent video capsule endoscopy for GI bleed but it was found to be negative .She  underwent  left-sided thoracentesis with removal of 1.7 liters of fluid on 10/09/18. She was managed with aggressive  IV diuretics here and was being followed by heart failure team here.  She feels much better now.  She is stable for discharge to home today.  She will resume torsemide.  She will follow-up with cardiology on discharge.  Following problems were addressed during her  Hospitalization :  Acute on chronic combined systolic/diastolic CHF: Was treated with aggressive IV diuretics. Started on torsemide 80 mg twice a day now  .  She had significant diuresis since admission and  lost significant of weight. Continue  Imdur.  Not on ACE/ARB or Aldactone due to advanced kidney disease.  Heart failure team was following. Echocardiogram done on 10/07/2018 showed ejection fraction of 30 to 35%mid to distal anterior, apical and mid to distal inferior hypokinesis.  Acute on chronic anemia with history of recent GI bleed: Status post 2 units  of blood transfusion on 10/08/2018 after her hemoglobin dropped to 6.1.  Last Hb is 8.1 .  GI was following.  EGD done on last hospitalization showed erosive gastritis.  Continue PPI.  Patient declined colonoscopy.  Underwent video capsule endoscopy on 10/09/17 and found to negative for bleeding.  Her anemia could be associated with her chronic kidney disease.  She was also  given  IV iron , ESA .  Continue iron supplementation  Acute on chronic kidney disease stage III-IV:  Presented with Cardiorenal syndrome.Creatinine  today is 2.8.    She follows with Dr. Jimmy Footman as an outpatient.  Nephrology was following here.  Right-sided pleural effusion:Secondary to CHF exacerbation.  Underwent ultrasound-guided thoracentesis with goal of 1.7 L of fluid on 10/09/2018  Hypokalemia: Potassium 3.1 today.    New supplementation at home.  Chronic A. fib with history of AV node ablation/PPM: Has refused anticoagulation in the past.  Aspirin also on hold due to severe anemia. Currently rate is controlled.  Can resume aspirin after discussion with her PCP as an outpatient.  Hyponatremia:Secondary  to CHF.Resolved  Severe mitral regurgitation: Follows with Dr. Burt Knack.  There was discussion for possible mitral clip.  Follow-up with cardiology as an outpatient  OSA: Continue CPAP  Thrombocytopenia: Stable now.  Deconditioning/debility/multiple comorbidities/poor prognosis: Palliative Care was following.  She is DNR. Follow  Up with palliative care/hospice as an outpatient     Discharge Diagnoses:  Principal Problem:   Hypertensive cardiovascular-renal disease, stage 1-4 or unspecified chronic kidney disease, with heart failure (HCC) Active Problems:   CKD stage G3b/A1, GFR 30-44 and albumin creatinine ratio <30 mg/g ([place])  Acute on chronic combined systolic and diastolic CHF (congestive heart failure) (HCC)   Symptomatic anemia   Acute renal failure superimposed on stage 4 chronic kidney disease  (HCC)   Hypokalemia   Hyponatremia   Thrombocytopenia (HCC)   Acute on chronic combined systolic and diastolic congestive heart failure, NYHA class 4 (HCC)   Acute on chronic systolic and diastolic heart failure, NYHA class 4 ([place])   Goals of care, counseling/discussion   Palliative care by specialist   DNR (do not resuscitate) discussion    Discharge Instructions  Discharge Instructions    Diet - low sodium heart healthy   Complete by:  As directed    Discharge instructions   Complete by:  As directed    1)Please take prescribed medications as instructed. 2)Follow up with your PCP in a week. Do a CBC and BMP tests during the follow up. 3)Follow up at heart failure clinic. 4)Continue to monitor your weight at home. Restrict fluid intake to less than 1200 ml a day.   Increase activity slowly   Complete by:  As directed      Allergies as of 10/18/2018      Reactions   Cephalexin Shortness Of Breath   Codeine Anaphylaxis   REACTION: throat swelling   Contrast Media [iodinated Diagnostic Agents] Other (See Comments)   Patient states "I have chronic kidney disease so the doctor said no dye in my veins."   Peppermint Flavor Shortness Of Breath   Prednisone Anaphylaxis, Shortness Of Breath, Swelling   REACTION: swelling, S.O.B.   Tape Other (See Comments)   States plastic tape blisters her skin   Ciprofloxacin Hives   Tolerating levofloxin    Latex Itching, Rash   Penicillins Hives   DID THE REACTION INVOLVE: Swelling of the face/tongue/throat, SOB, or low BP? No Sudden or severe rash/hives, skin peeling, or the inside of the mouth or nose? Yes Did it require medical treatment? Pt was in hospital at the time of reaction When did it last happen?age - mid 40's If all above answers are "NO", may proceed with cephalosporin use.   Coreg [carvedilol] Other (See Comments)   Beta Blockers cause her organs to shut down per patient   Diamox [acetazolamide] Rash   Rash after 2  doses of diamox       Medication List    STOP taking these medications   aspirin 325 MG tablet   PRESCRIPTION MEDICATION     TAKE these medications   allopurinol 300 MG tablet Commonly known as:  ZYLOPRIM Take 300 mg by mouth every evening.   calcitRIOL 0.25 MCG capsule Commonly known as:  ROCALTROL Take 0.25 mcg by mouth every other day.   COD LIVER OIL PO Take 1 capsule by mouth daily.   docusate sodium 100 MG capsule Commonly known as:  COLACE Take 100 mg by mouth daily as needed for mild constipation.   feeding supplement Liqd Take 1 Container by mouth 2 (two) times daily.   ferrous sulfate 325 (65 FE) MG tablet Take 325 mg by mouth at bedtime.   HYDROcodone-acetaminophen 10-325 MG tablet Commonly known as:  NORCO Take 1 tablet by mouth See admin instructions. Take 1 tablet by mouth twice daily, may also take 1/2 tablet an additional 2 times during the day as needed for pain   isosorbide mononitrate 30 MG 24 hr tablet Commonly known as:  IMDUR Take 0.5 tablets (15 mg total) by mouth daily. What changed:  when to take this  methocarbamol 500 MG tablet Commonly known as:  ROBAXIN Take 1 tablet (500 mg total) by mouth every 6 (six) hours as needed for muscle spasms.   metolazone 2.5 MG tablet Commonly known as:  ZAROXOLYN Take 2.5 mg by mouth as needed (fluid).   multivitamin with minerals Tabs tablet Take 1 tablet by mouth at bedtime.   pantoprazole 40 MG tablet Commonly known as:  PROTONIX Take 1 tablet (40 mg total) by mouth 2 (two) times daily.   polyethylene glycol packet Commonly known as:  MIRALAX / GLYCOLAX Take 17 g by mouth at bedtime.   potassium chloride SA 20 MEQ tablet Commonly known as:  K-DUR,KLOR-CON Start on 10/24/18 What changed:    how much to take  how to take this  when to take this  additional instructions   potassium chloride SA 20 MEQ tablet Commonly known as:  K-DUR,KLOR-CON Take 2 tablets (40 mEq total) by mouth 2  (two) times daily for 5 days. Start taking on:  October 19, 2018 What changed:  You were already taking a medication with the same name, and this prescription was added. Make sure you understand how and when to take each.   torsemide 20 MG tablet Commonly known as:  DEMADEX Take 4 tablets (80 mg total) by mouth 2 (two) times daily. May also take 1 tablet (20 mg total) as needed (for weight greater than 246).   vitamin B-12 500 MCG tablet Commonly known as:  CYANOCOBALAMIN Take 500 mcg by mouth daily.            Durable Medical Equipment  (From admission, onward)         Start     Ordered   10/18/18 1225  Heart failure home health orders  (Heart failure home health orders / Face to face)  Once    Comments:  Heart Failure Follow-up Care:  Verify follow-up appointments per Patient Discharge Instructions. Confirm transportation arranged. Reconcile home medications with discharge medication list. Remove discontinued medications from use. Assist patient/caregiver to manage medications using pill box. Reinforce low sodium food selection Assessments: Vital signs and oxygen saturation at each visit. Assess home environment for safety concerns, caregiver support and availability of low-sodium foods. Consult Education officer, museum, PT/OT, Dietitian, and CNA based on assessments. Perform comprehensive cardiopulmonary assessment. Notify MD for any change in condition or weight gain of 3 pounds in one day or 5 pounds in one week with symptoms. Daily Weights and Symptom Monitoring: Ensure patient has access to scales. Teach patient/caregiver to weigh daily before breakfast and after voiding using same scale and record.    Teach patient/caregiver to track weight and symptoms and when to notify Provider. Activity: Develop individualized activity plan with patient/caregiver.   Question Answer Comment  Heart Failure Follow-up Care [facility] at [phone]   Obtain the  following labs Basic Metabolic Panel   Lab frequency Weekly   Fax lab results to [facility] at [phone]   Diet Low Sodium Heart Healthy   Fluid restrictions: 1200 mL Fluid      10/18/18 1226   10/15/18 1719  For home use only DME Walker rolling  Once    Comments:  bariatric  Question:  Patient needs a walker to treat with the following condition  Answer:  CHF (congestive heart failure) ([place])   10/15/18 1719         Follow-up Information    [facility]. Go on 11/08/2018.  Specialty:  Cardiology Why:    1000  the [facility].  Please bring all medications to appt.  Gate code is [id number] for Feb Contact information: [address] [id number] RK [id number] [place]       [name], MD. Go on 10/25/2018.   Specialty:  Family Medicine Why:  @12 :30pm Contact information: [address] [place] [address] [phone]          Allergies  Allergen Reactions  . Cephalexin Shortness Of Breath  . Codeine Anaphylaxis    REACTION: throat swelling  . Contrast Media [Iodinated Diagnostic Agents] Other (See Comments)    Patient states "I have chronic kidney disease so the doctor said no dye in my veins."  . Peppermint Flavor Shortness Of Breath  . Prednisone Anaphylaxis, Shortness Of Breath and Swelling    REACTION: swelling, S.O.B.  . Tape Other (See Comments)    States plastic tape blisters her skin  . Ciprofloxacin Hives    Tolerating levofloxin   . Latex Itching and Rash  . Penicillins Hives    DID THE REACTION INVOLVE: Swelling of the face/tongue/throat, SOB, or low BP? No Sudden or severe rash/hives, skin peeling, or the inside of the mouth or nose? Yes Did it require medical treatment? Pt was in hospital at the time of reaction When did it last happen?age - mid 40's If all above answers are "NO", may proceed with cephalosporin use.  . Coreg  [Carvedilol] Other (See Comments)    Beta Blockers cause her organs to shut down per patient  . Diamox [Acetazolamide] Rash    Rash after 2 doses of diamox     Consultations:  Cardiology, palliative care   Procedures/Studies: Dg Chest 1 View  Result Date: 10/09/2018 CLINICAL DATA:  Status post right thoracentesis today. EXAM: CHEST  1 VIEW COMPARISON:  PA and lateral chest 10/08/2018. FINDINGS: Right pleural effusion seen on the prior examination is markedly decreased after thoracentesis. No pneumothorax. Lungs are clear. Massive cardiomegaly is unchanged. IMPRESSION: Marked decrease in right pleural effusion after thoracentesis. Negative for pneumothorax. Cardiomegaly. Electronically Signed   By: Inge Rise M.D.   On: 10/09/2018 12:01   Dg Chest 1 View  Result Date: 09/23/2018 CLINICAL DATA:  Tachycardia EXAM: CHEST  1 VIEW COMPARISON:  06/25/2018 FINDINGS: Left pacer remains in place, unchanged. Cardiomegaly with vascular congestion. Perihilar and lower lobe opacities likely reflect edema, improved since prior study. Improving right effusion with small residual right effusion. IMPRESSION: Cardiomegaly with vascular congestion and mild pulmonary edema, improving since prior study. Small right effusion, and also improving since prior study. Electronically Signed   By: Rolm Baptise M.D.   On: 09/23/2018 20:00   Dg Chest 2 View  Result Date: 10/08/2018 CLINICAL DATA:  Pleural effusion, sleep apnea, atrial fibrillation, non ischemic cardiomyopathy, hypertension, chronic combined systolic and diastolic CHF, former smoker EXAM: CHEST - 2 VIEW COMPARISON:  10/06/2018 FINDINGS: LEFT subclavian transvenous pacemaker lead projects over RIGHT ventricle. Enlargement of cardiac silhouette with pulmonary vascular congestion. Atherosclerotic calcification aorta. Increased RIGHT pleural effusion and basilar atelectasis. No definite infiltrate or pneumothorax. Bones demineralized. IMPRESSION:  Enlargement of cardiac silhouette with pulmonary vascular congestion. Increased RIGHT pleural effusion and basilar atelectasis. Electronically Signed   By: Lavonia Dana M.D.   On: 10/08/2018 16:58   US Renal  Result Date: 09/24/2018 CLINICAL DATA:  Acute onset of renal insufficiency. EXAM: RENAL / URINARY TRACT ULTRASOUND COMPLETE COMPARISON:  CT of the abdomen  and pelvis from 04/05/2016 FINDINGS: Right Kidney: Renal measurements: 8.5 x 4.4 x 4.9 cm = volume: 93.1 mL. Increased renal parenchymal echogenicity is noted. No mass or hydronephrosis visualized. Left Kidney: Renal measurements: 7.7 x 4.5 x 6.3 cm = volume: 112.6 mL. Not well characterized due to overlying bowel gas and limitations in positioning. Increased renal parenchymal echogenicity is noted. No mass or hydronephrosis visualized. Bladder: Not visualized. IMPRESSION: Significantly limited study due to limitations in positioning and overlying bowel gas. No evidence of hydronephrosis. Increased renal parenchymal echogenicity may reflect medical renal disease. Electronically Signed   By: Garald Balding M.D.   On: 09/24/2018 01:53   Dg Chest Portable 1 View  Result Date: 10/06/2018 CLINICAL DATA:  Shortness of breath with interscapular and epigastric pain. EXAM: PORTABLE CHEST 1 VIEW COMPARISON:  Radiographs 04/02/2017, 09/23/2018 and 09/24/2018. FINDINGS: 1300 hours. Central line has been removed. Left subclavian pacemaker lead appears unchanged overlapping the right ventricular apex. The heart is enlarged. There is aortic atherosclerosis with chronic vascular congestion. There is a chronic right pleural effusion and chronic right basilar airspace disease. Probable edema on the most recent study has resolved. No acute osseous findings. IMPRESSION: Resolved edema. Stable cardiomegaly, chronic right pleural effusion and right basilar pulmonary opacity. No suggested acute findings. Electronically Signed   By: Richardean Sale M.D.   On: 10/06/2018  14:21   Dg Chest Port 1 View  Result Date: 09/24/2018 CLINICAL DATA:  Encounter for central line placement. EXAM: PORTABLE CHEST 1 VIEW COMPARISON:  09/23/2018. FINDINGS: Massive cardiac enlargement. Single lead pacer unchanged. BILATERAL airspace opacities have considerably worsened since yesterday's radiograph with large RIGHT pleural effusion consistent with pulmonary edema. Central venous line placement from RIGHT IJ approach lies with its tip in the mid to distal SVC. No pneumothorax. IMPRESSION: 1. Satisfactory RIGHT IJ central venous line placement. 2. Worsening pulmonary edema. 3. No pneumothorax. Electronically Signed   By: Staci Righter M.D.   On: 09/24/2018 16:33   Ir Thoracentesis Asp Pleural Space W/img Guide  Result Date: 10/09/2018 INDICATION: Patient with history of CHD, a.fib, severe MR, OSA, CKD, NASH liver cirrhosis recently admitted for CHF exacerbation - noted to have right sided pleural effusion on CXR. Request for therapeutic thoracentesis today in IR. EXAM: ULTRASOUND GUIDED RIGHT THORACENTESIS MEDICATIONS: 10 mL 1% lidocaine COMPLICATIONS: None immediate. PROCEDURE: An ultrasound guided thoracentesis was thoroughly discussed with the patient and questions answered. The benefits, risks, alternatives and complications were also discussed. The patient understands and wishes to proceed with the procedure. Written consent was obtained. Ultrasound was performed to localize and mark an adequate pocket of fluid in the right chest. The area was then prepped and draped in the normal sterile fashion. 1% Lidocaine was used for local anesthesia. Under ultrasound guidance a 6 Fr Safe-T-Centesis catheter was introduced. Thoracentesis was performed. The catheter was removed and a dressing applied. FINDINGS: A total of approximately 1.7 L of golden yellow fluid was removed. IMPRESSION: Successful ultrasound guided right thoracentesis yielding 1.7 L of pleural fluid. Read by Candiss Norse, PA-C  Electronically Signed   By: Aletta Edouard M.D.   On: 10/09/2018 13:34       Subjective: Patient seen and examined the bedside this morning.  Looks comfortable.  Hemodynamically stable.  Denies any chest pain or shortness of breath.  Discharge Exam: Vitals:   10/17/18 2008 10/18/18 0600  BP: 126/81 113/78  Pulse: 98 99  Resp: 20 18  Temp: 97.8 F (36.6 C) 97.7 F (36.5 C)  SpO2: 96% 96%   Vitals:   10/17/18 1003 10/17/18 1229 10/17/18 2008 10/18/18 0600  BP: 118/67 (!) 106/54 126/81 113/78  Pulse: 92 92 98 99  Resp:  18 20 18   Temp:  97.9 F (36.6 C) 97.8 F (36.6 C) 97.7 F (36.5 C)  TempSrc:  Oral Oral Oral  SpO2:  99% 96% 96%  Weight:    111.9 kg  Height:        General: Pt is alert, awake, not in acute distress, morbidly obese Cardiovascular: RRR, S1/S2 +, no rubs, no gallops Respiratory: Bilateral decreased air entry on the bases  abdominal: Soft, NT, ND, bowel sounds + Extremities: 1-2+ lower extremity edema , no cyanosis, venous stasis change in the bilateral lower extremities    The results of significant diagnostics from this hospitalization (including imaging, microbiology, ancillary and laboratory) are listed below for reference.     Microbiology: No results found for this or any previous visit (from the past 240 hour(s)).   Labs: BNP (last 3 results) Recent Labs    06/20/18 2249 10/06/18 1309  BNP 160.6* 923.3*   Basic Metabolic Panel: Recent Labs  Lab 10/14/18 0539 10/15/18 0523 10/16/18 0503 10/17/18 0521 10/18/18 0526  NA 138 135 136 136 135  K 4.1 4.7 4.6 3.2* 3.1*  CL 92* 93* 99 97* 94*  CO2 32 30 29 27 29   GLUCOSE 102* 100* 88 86 91  BUN 89* 88* 85* 79* 72*  CREATININE 3.20* 3.40* 3.24* 2.92* 2.81*  CALCIUM 8.8* 9.2 8.9 8.9 9.2  MG  --   --  3.1*  --   --    Liver Function Tests: No results for input(s): AST, ALT, ALKPHOS, BILITOT, PROT, ALBUMIN in the last 168 hours. No results for input(s): LIPASE, AMYLASE in the last  168 hours. No results for input(s): AMMONIA in the last 168 hours. CBC: Recent Labs  Lab 10/13/18 0457 10/14/18 0539 10/15/18 0523 10/16/18 0503 10/17/18 0521  WBC 4.0 3.7* 4.4 3.6* 4.1  HGB 8.0* 8.1* 8.4* 7.5* 8.1*  HCT 26.4* 26.6* 28.1* 25.6* 27.4*  MCV 97.1 97.4 97.2 97.7 97.5  PLT 123* 129* 144* 126* 158   Cardiac Enzymes: No results for input(s): CKTOTAL, CKMB, CKMBINDEX, TROPONINI in the last 168 hours. BNP: Invalid input(s): POCBNP CBG: No results for input(s): GLUCAP in the last 168 hours. D-Dimer No results for input(s): DDIMER in the last 72 hours. Hgb A1c No results for input(s): HGBA1C in the last 72 hours. Lipid Profile No results for input(s): CHOL, HDL, LDLCALC, TRIG, CHOLHDL, LDLDIRECT in the last 72 hours. Thyroid function studies No results for input(s): TSH, T4TOTAL, T3FREE, THYROIDAB in the last 72 hours.  Invalid input(s): FREET3 Anemia work up No results for input(s): VITAMINB12, FOLATE, FERRITIN, TIBC, IRON, RETICCTPCT in the last 72 hours. Urinalysis    Component Value Date/Time   COLORURINE YELLOW 10/06/2018 1254   APPEARANCEUR CLEAR 10/06/2018 1254   LABSPEC 1.008 10/06/2018 1254   PHURINE 6.0 10/06/2018 1254   GLUCOSEU NEGATIVE 10/06/2018 1254   HGBUR NEGATIVE 10/06/2018 1254   BILIRUBINUR NEGATIVE 10/06/2018 1254   KETONESUR NEGATIVE 10/06/2018 1254   PROTEINUR NEGATIVE 10/06/2018 1254   UROBILINOGEN 0.2 03/06/2013 2035   NITRITE NEGATIVE 10/06/2018 1254   LEUKOCYTESUR TRACE (A) 10/06/2018 1254   Sepsis Labs Invalid input(s): PROCALCITONIN,  WBC,  LACTICIDVEN Microbiology No results found for this or any previous visit (from the past 240 hour(s)).  Please note: You were cared for by a hospitalist during your  hospital stay. Once you are discharged, your primary care physician will handle any further medical issues. Please note that NO REFILLS for any discharge medications will be authorized once you are discharged, as it is imperative  that you return to your primary care physician (or establish a relationship with a primary care physician if you do not have one) for your post hospital discharge needs so that they can reassess your need for medications and monitor your lab values.    Time coordinating discharge: 40 minutes  SIGNED:   Shelly Coss, MD  [facility] 10/18/2018, 12:35 PM Pager [phone]  If 7PM-7AM, please contact night-coverage [email or web address] Password [email or web address]

## 2018-10-18 NOTE — Progress Notes (Signed)
Patient has home unit BIPAP. Patient can place on when she is ready.

## 2018-10-19 LAB — UREA NITROGEN, URINE: Urea Nitrogen, Ur: 303 mg/dL

## 2018-10-21 ENCOUNTER — Other Ambulatory Visit (HOSPITAL_COMMUNITY): Payer: Medicare Other

## 2018-10-21 ENCOUNTER — Telehealth: Payer: Self-pay | Admitting: Gastroenterology

## 2018-10-21 NOTE — Telephone Encounter (Signed)
[phone] PLEASE CALL PATIENT, SHE SAID SHE IS NOT STRONG ENOUGH TO HAVE HER PROCEDURE, HER HEART AND KIDNEYS CAN NOT TOLERATE IT.

## 2018-10-21 NOTE — Telephone Encounter (Signed)
I called spoke with patient and she wants to cancel her procedure. She reports she just isn't strong enough to do TCS and reports her doctor did not recommend she have it done either. Procedure cancelled. FYI to LSL and SLF.

## 2018-10-21 NOTE — Telephone Encounter (Signed)
Called patient and she is scheduled to come in 2/19 at 11:30am to discuss

## 2018-10-21 NOTE — Telephone Encounter (Signed)
See telephone note from 10/21/2018.

## 2018-10-21 NOTE — Telephone Encounter (Signed)
PLEASE CALL PT. WE UNDERSTAND SHE IS NTO FEELING UP TO THE COLONOSCOPY. HOWEVER IT WAS SUPPOSE TO BE DONE BECAUSE SHE HAS A LOW BLOOD COUNT THAT IS REQUIRING TRANSFUSION. SHE SHOULD PLAN ON HAVING A COLONOSCOPY SOON. OTHERWISE SHE WILL CONTINNUE TO HAVE A LOW BLOOD COUNT THAT REQUIRES BLOOD TRANSFUSION WHICH WILL EVENTUALLY LEAD TO ANTIBODIES AND SHE WILL NOT BE ABLE TO HAVE TRANSFUSION ANYMORE.  A LOW BLOOD COUNT IS NOT GOOD FOR HER HEART, KIDNEYS, OR HER BRAIN. SHE CAN MAKE AN APPT TO HAVE A FACT TO FACE DISCUSSION ABOUT THE BENEFITS V. RISKS OF COLONOSCOPY(Dx: ANEMIA).

## 2018-10-21 NOTE — Telephone Encounter (Signed)
REVIEWED-NO ADDITIONAL RECOMMENDATIONS. 

## 2018-10-25 DIAGNOSIS — N183 Chronic kidney disease, stage 3 (moderate): Secondary | ICD-10-CM | POA: Diagnosis not present

## 2018-10-25 DIAGNOSIS — K7460 Unspecified cirrhosis of liver: Secondary | ICD-10-CM | POA: Diagnosis not present

## 2018-10-25 DIAGNOSIS — J90 Pleural effusion, not elsewhere classified: Secondary | ICD-10-CM | POA: Diagnosis not present

## 2018-10-25 DIAGNOSIS — I509 Heart failure, unspecified: Secondary | ICD-10-CM | POA: Diagnosis not present

## 2018-10-25 DIAGNOSIS — Z23 Encounter for immunization: Secondary | ICD-10-CM | POA: Diagnosis not present

## 2018-10-25 DIAGNOSIS — D649 Anemia, unspecified: Secondary | ICD-10-CM | POA: Diagnosis not present

## 2018-10-25 DIAGNOSIS — M170 Bilateral primary osteoarthritis of knee: Secondary | ICD-10-CM | POA: Diagnosis not present

## 2018-10-25 DIAGNOSIS — I10 Essential (primary) hypertension: Secondary | ICD-10-CM | POA: Diagnosis not present

## 2018-10-29 ENCOUNTER — Inpatient Hospital Stay (HOSPITAL_COMMUNITY): Payer: Medicare Other

## 2018-11-01 ENCOUNTER — Telehealth: Payer: Self-pay | Admitting: Internal Medicine

## 2018-11-01 NOTE — Telephone Encounter (Signed)
Error

## 2018-11-05 ENCOUNTER — Inpatient Hospital Stay (HOSPITAL_COMMUNITY)
Admission: EM | Admit: 2018-11-05 | Discharge: 2018-11-18 | DRG: 811 | Disposition: A | Discharge status: Skilled Nursing Facility | Payer: Medicare Other | Attending: Internal Medicine | Admitting: Internal Medicine

## 2018-11-05 ENCOUNTER — Other Ambulatory Visit: Payer: Self-pay

## 2018-11-05 ENCOUNTER — Observation Stay (HOSPITAL_COMMUNITY): Payer: Medicare Other

## 2018-11-05 ENCOUNTER — Encounter (HOSPITAL_COMMUNITY): Payer: Self-pay

## 2018-11-05 DIAGNOSIS — D631 Anemia in chronic kidney disease: Secondary | ICD-10-CM | POA: Diagnosis present

## 2018-11-05 DIAGNOSIS — K5900 Constipation, unspecified: Secondary | ICD-10-CM | POA: Diagnosis not present

## 2018-11-05 DIAGNOSIS — I5043 Acute on chronic combined systolic (congestive) and diastolic (congestive) heart failure: Secondary | ICD-10-CM | POA: Diagnosis present

## 2018-11-05 DIAGNOSIS — E611 Iron deficiency: Secondary | ICD-10-CM | POA: Diagnosis present

## 2018-11-05 DIAGNOSIS — Z950 Presence of cardiac pacemaker: Secondary | ICD-10-CM

## 2018-11-05 DIAGNOSIS — G4733 Obstructive sleep apnea (adult) (pediatric): Secondary | ICD-10-CM | POA: Diagnosis not present

## 2018-11-05 DIAGNOSIS — Z800 Family history of malignant neoplasm of digestive organs: Secondary | ICD-10-CM

## 2018-11-05 DIAGNOSIS — N2581 Secondary hyperparathyroidism of renal origin: Secondary | ICD-10-CM | POA: Diagnosis not present

## 2018-11-05 DIAGNOSIS — Z6841 Body Mass Index (BMI) 40.0 and over, adult: Secondary | ICD-10-CM

## 2018-11-05 DIAGNOSIS — Z90710 Acquired absence of both cervix and uterus: Secondary | ICD-10-CM

## 2018-11-05 DIAGNOSIS — R195 Other fecal abnormalities: Secondary | ICD-10-CM

## 2018-11-05 DIAGNOSIS — D649 Anemia, unspecified: Secondary | ICD-10-CM | POA: Diagnosis present

## 2018-11-05 DIAGNOSIS — Z823 Family history of stroke: Secondary | ICD-10-CM

## 2018-11-05 DIAGNOSIS — D62 Acute posthemorrhagic anemia: Principal | ICD-10-CM | POA: Diagnosis present

## 2018-11-05 DIAGNOSIS — I872 Venous insufficiency (chronic) (peripheral): Secondary | ICD-10-CM | POA: Diagnosis present

## 2018-11-05 DIAGNOSIS — Z8249 Family history of ischemic heart disease and other diseases of the circulatory system: Secondary | ICD-10-CM

## 2018-11-05 DIAGNOSIS — R109 Unspecified abdominal pain: Secondary | ICD-10-CM

## 2018-11-05 DIAGNOSIS — I428 Other cardiomyopathies: Secondary | ICD-10-CM | POA: Diagnosis present

## 2018-11-05 DIAGNOSIS — J918 Pleural effusion in other conditions classified elsewhere: Secondary | ICD-10-CM | POA: Diagnosis present

## 2018-11-05 DIAGNOSIS — Z79891 Long term (current) use of opiate analgesic: Secondary | ICD-10-CM

## 2018-11-05 DIAGNOSIS — Z881 Allergy status to other antibiotic agents status: Secondary | ICD-10-CM

## 2018-11-05 DIAGNOSIS — I959 Hypotension, unspecified: Secondary | ICD-10-CM | POA: Diagnosis not present

## 2018-11-05 DIAGNOSIS — R531 Weakness: Secondary | ICD-10-CM | POA: Diagnosis not present

## 2018-11-05 DIAGNOSIS — I10 Essential (primary) hypertension: Secondary | ICD-10-CM | POA: Diagnosis not present

## 2018-11-05 DIAGNOSIS — R079 Chest pain, unspecified: Secondary | ICD-10-CM | POA: Diagnosis present

## 2018-11-05 DIAGNOSIS — R0600 Dyspnea, unspecified: Secondary | ICD-10-CM | POA: Diagnosis not present

## 2018-11-05 DIAGNOSIS — Z87442 Personal history of urinary calculi: Secondary | ICD-10-CM

## 2018-11-05 DIAGNOSIS — K2960 Other gastritis without bleeding: Secondary | ICD-10-CM | POA: Diagnosis present

## 2018-11-05 DIAGNOSIS — Z833 Family history of diabetes mellitus: Secondary | ICD-10-CM

## 2018-11-05 DIAGNOSIS — Z8541 Personal history of malignant neoplasm of cervix uteri: Secondary | ICD-10-CM

## 2018-11-05 DIAGNOSIS — Z9689 Presence of other specified functional implants: Secondary | ICD-10-CM

## 2018-11-05 DIAGNOSIS — N184 Chronic kidney disease, stage 4 (severe): Secondary | ICD-10-CM | POA: Diagnosis not present

## 2018-11-05 DIAGNOSIS — T502X5A Adverse effect of carbonic-anhydrase inhibitors, benzothiadiazides and other diuretics, initial encounter: Secondary | ICD-10-CM | POA: Diagnosis present

## 2018-11-05 DIAGNOSIS — M109 Gout, unspecified: Secondary | ICD-10-CM | POA: Diagnosis not present

## 2018-11-05 DIAGNOSIS — I130 Hypertensive heart and chronic kidney disease with heart failure and stage 1 through stage 4 chronic kidney disease, or unspecified chronic kidney disease: Secondary | ICD-10-CM | POA: Diagnosis present

## 2018-11-05 DIAGNOSIS — N200 Calculus of kidney: Secondary | ICD-10-CM | POA: Diagnosis not present

## 2018-11-05 DIAGNOSIS — I480 Paroxysmal atrial fibrillation: Secondary | ICD-10-CM | POA: Diagnosis not present

## 2018-11-05 DIAGNOSIS — N189 Chronic kidney disease, unspecified: Secondary | ICD-10-CM | POA: Diagnosis not present

## 2018-11-05 DIAGNOSIS — Z79899 Other long term (current) drug therapy: Secondary | ICD-10-CM

## 2018-11-05 DIAGNOSIS — I083 Combined rheumatic disorders of mitral, aortic and tricuspid valves: Secondary | ICD-10-CM | POA: Diagnosis present

## 2018-11-05 DIAGNOSIS — I272 Pulmonary hypertension, unspecified: Secondary | ICD-10-CM | POA: Diagnosis present

## 2018-11-05 DIAGNOSIS — K922 Gastrointestinal hemorrhage, unspecified: Secondary | ICD-10-CM | POA: Diagnosis not present

## 2018-11-05 DIAGNOSIS — Z87891 Personal history of nicotine dependence: Secondary | ICD-10-CM

## 2018-11-05 DIAGNOSIS — K429 Umbilical hernia without obstruction or gangrene: Secondary | ICD-10-CM | POA: Diagnosis present

## 2018-11-05 DIAGNOSIS — Z87440 Personal history of urinary (tract) infections: Secondary | ICD-10-CM | POA: Diagnosis not present

## 2018-11-05 DIAGNOSIS — Z91040 Latex allergy status: Secondary | ICD-10-CM

## 2018-11-05 DIAGNOSIS — Z888 Allergy status to other drugs, medicaments and biological substances status: Secondary | ICD-10-CM

## 2018-11-05 DIAGNOSIS — I129 Hypertensive chronic kidney disease with stage 1 through stage 4 chronic kidney disease, or unspecified chronic kidney disease: Secondary | ICD-10-CM | POA: Diagnosis not present

## 2018-11-05 DIAGNOSIS — K2971 Gastritis, unspecified, with bleeding: Secondary | ICD-10-CM | POA: Diagnosis present

## 2018-11-05 DIAGNOSIS — Z66 Do not resuscitate: Secondary | ICD-10-CM | POA: Diagnosis present

## 2018-11-05 DIAGNOSIS — I5042 Chronic combined systolic (congestive) and diastolic (congestive) heart failure: Secondary | ICD-10-CM | POA: Diagnosis not present

## 2018-11-05 DIAGNOSIS — N179 Acute kidney failure, unspecified: Secondary | ICD-10-CM | POA: Diagnosis present

## 2018-11-05 DIAGNOSIS — I209 Angina pectoris, unspecified: Secondary | ICD-10-CM | POA: Diagnosis present

## 2018-11-05 DIAGNOSIS — K219 Gastro-esophageal reflux disease without esophagitis: Secondary | ICD-10-CM | POA: Diagnosis present

## 2018-11-05 DIAGNOSIS — J9811 Atelectasis: Secondary | ICD-10-CM | POA: Diagnosis not present

## 2018-11-05 DIAGNOSIS — Z885 Allergy status to narcotic agent status: Secondary | ICD-10-CM

## 2018-11-05 DIAGNOSIS — J90 Pleural effusion, not elsewhere classified: Secondary | ICD-10-CM | POA: Diagnosis not present

## 2018-11-05 DIAGNOSIS — E876 Hypokalemia: Secondary | ICD-10-CM | POA: Diagnosis present

## 2018-11-05 DIAGNOSIS — I878 Other specified disorders of veins: Secondary | ICD-10-CM | POA: Diagnosis present

## 2018-11-05 DIAGNOSIS — I5023 Acute on chronic systolic (congestive) heart failure: Secondary | ICD-10-CM

## 2018-11-05 DIAGNOSIS — N390 Urinary tract infection, site not specified: Secondary | ICD-10-CM | POA: Diagnosis not present

## 2018-11-05 DIAGNOSIS — I4821 Permanent atrial fibrillation: Secondary | ICD-10-CM | POA: Diagnosis not present

## 2018-11-05 DIAGNOSIS — Z880 Allergy status to penicillin: Secondary | ICD-10-CM

## 2018-11-05 DIAGNOSIS — Z91041 Radiographic dye allergy status: Secondary | ICD-10-CM

## 2018-11-05 DIAGNOSIS — K7581 Nonalcoholic steatohepatitis (NASH): Secondary | ICD-10-CM | POA: Diagnosis present

## 2018-11-05 LAB — CBC
HEMATOCRIT: 16.8 % — AB (ref 36.0–46.0)
Hemoglobin: 5.2 g/dL — CL (ref 12.0–15.0)
MCH: 31 pg (ref 26.0–34.0)
MCHC: 31 g/dL (ref 30.0–36.0)
MCV: 100 fL (ref 80.0–100.0)
Platelets: 151 10*3/uL (ref 150–400)
RBC: 1.68 MIL/uL — ABNORMAL LOW (ref 3.87–5.11)
RDW: 21.1 % — ABNORMAL HIGH (ref 11.5–15.5)
WBC: 6.1 10*3/uL (ref 4.0–10.5)
nRBC: 0 % (ref 0.0–0.2)

## 2018-11-05 LAB — POC OCCULT BLOOD, ED: Fecal Occult Bld: POSITIVE — AB

## 2018-11-05 LAB — COMPREHENSIVE METABOLIC PANEL
ALT: 14 U/L (ref 0–44)
AST: 22 U/L (ref 15–41)
Albumin: 3.2 g/dL — ABNORMAL LOW (ref 3.5–5.0)
Alkaline Phosphatase: 37 U/L — ABNORMAL LOW (ref 38–126)
Anion gap: 19 — ABNORMAL HIGH (ref 5–15)
BUN: 184 mg/dL — ABNORMAL HIGH (ref 8–23)
CO2: 29 mmol/L (ref 22–32)
Calcium: 9.3 mg/dL (ref 8.9–10.3)
Chloride: 84 mmol/L — ABNORMAL LOW (ref 98–111)
Creatinine, Ser: 3.13 mg/dL — ABNORMAL HIGH (ref 0.44–1.00)
GFR calc Af Amer: 16 mL/min — ABNORMAL LOW (ref >60)
GFR calc non Af Amer: 14 mL/min — ABNORMAL LOW (ref >60)
Glucose, Bld: 169 mg/dL — ABNORMAL HIGH (ref 70–99)
POTASSIUM: 2.4 mmol/L — AB (ref 3.5–5.1)
SODIUM: 132 mmol/L — AB (ref 135–145)
Total Bilirubin: 0.8 mg/dL (ref 0.3–1.2)
Total Protein: 5.8 g/dL — ABNORMAL LOW (ref 6.5–8.1)

## 2018-11-05 LAB — I-STAT TROPONIN, ED: TROPONIN I, POC: 0.05 ng/mL (ref 0.00–0.08)

## 2018-11-05 LAB — TROPONIN I: Troponin I: 0.05 ng/mL (ref <0.03)

## 2018-11-05 LAB — MAGNESIUM: MAGNESIUM: 3 mg/dL — AB (ref 1.7–2.4)

## 2018-11-05 LAB — BRAIN NATRIURETIC PEPTIDE: B Natriuretic Peptide: 228.9 pg/mL — ABNORMAL HIGH (ref 0.0–100.0)

## 2018-11-05 LAB — PREPARE RBC (CROSSMATCH)

## 2018-11-05 MED ORDER — POTASSIUM CHLORIDE CRYS ER 20 MEQ PO TBCR
60.0000 meq | EXTENDED_RELEASE_TABLET | Freq: Once | ORAL | Status: AC
  Administered 2018-11-05: 60 meq via ORAL
  Filled 2018-11-05: qty 3

## 2018-11-05 MED ORDER — PANTOPRAZOLE SODIUM 40 MG IV SOLR
40.0000 mg | Freq: Two times a day (BID) | INTRAVENOUS | Status: DC
  Administered 2018-11-05: 40 mg via INTRAVENOUS
  Filled 2018-11-05: qty 40

## 2018-11-05 MED ORDER — METHOCARBAMOL 500 MG PO TABS
500.0000 mg | ORAL_TABLET | Freq: Four times a day (QID) | ORAL | Status: DC | PRN
  Administered 2018-11-09 – 2018-11-18 (×9): 500 mg via ORAL
  Filled 2018-11-05 (×10): qty 1

## 2018-11-05 MED ORDER — SODIUM CHLORIDE 0.9 % IV SOLN: 10.0000 mL/h | Freq: Once | INTRAVENOUS | Status: DC

## 2018-11-05 MED ORDER — POTASSIUM CHLORIDE 10 MEQ/100ML IV SOLN
10.0000 meq | Freq: Once | INTRAVENOUS | Status: AC
  Administered 2018-11-05: 10 meq via INTRAVENOUS
  Filled 2018-11-05: qty 100

## 2018-11-05 MED ORDER — POTASSIUM CHLORIDE CRYS ER 20 MEQ PO TBCR
40.0000 meq | EXTENDED_RELEASE_TABLET | Freq: Once | ORAL | Status: AC
  Administered 2018-11-05: 40 meq via ORAL
  Filled 2018-11-05: qty 2

## 2018-11-05 NOTE — ED Provider Notes (Addendum)
[facility] EMERGENCY DEPARTMENT Provider Note   CSN: [id number] Arrival date & time: 11/05/18  1729     History   Chief Complaint Chief Complaint  Patient presents with  . Anemia  . Weakness    HPI [name] [name] is a 73 y.o. female.  73 year old female with history of A. fib and anemia presents with increased weakness.  Patient was at her doctor's office today for a posthospitalization follow-up and had a hemoglobin of 5.  Patient endorses increased dyspnea on exertion.  She notes she has been more pale but denies any black or bloody stools.  Has had no emesis.  During her recent hospitalization 2 weeks ago patient was found to have erosive gastritis and it was felt to be the cause of her hemoglobin drop last time.     Past Medical History:  Diagnosis Date  . Anemia   . Atrial fibrillation ([place])   . Cataract   . Cervical cancer ([place]) 1991   s/p hysterectomy  . Chronic cellulitis   . Chronic combined systolic and diastolic congestive heart failure, NYHA class 2 (HCC)    LVEF 25-30% with restrictive diastolic filling  . Degenerative joint disease   . Diverticulitis   . Essential hypertension   . GERD (gastroesophageal reflux disease)   . Gout   . Kidney stones   . Morbid obesity ([place])   . NICM (nonischemic cardiomyopathy) ([place]) 02/22/2017  . Osteoarthritis   . Permanent atrial fibrillation 05/04/2009   Qualifier: History of  By: Quentin Cornwall CMA, Janett Billow    . 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 Lovena Le  . Sleep apnea    CPAP    Patient Active Problem List   Diagnosis Date Noted  . Goals of care, counseling/discussion   . Palliative care by specialist   . DNR (do not resuscitate) discussion   . Hypertensive cardiovascular-renal disease, stage 1-4 or unspecified chronic kidney disease, with heart failure ([place])  10/06/2018  . Hypokalemia 10/06/2018  . Hyponatremia 10/06/2018  . Thrombocytopenia ([place]) 10/06/2018  . Acute on chronic combined systolic and diastolic congestive heart failure, NYHA class 4 ([place]) 10/06/2018  . Acute on chronic systolic and diastolic heart failure, NYHA class 4 ([place]) 10/06/2018  . Gastrointestinal hemorrhage 10/03/2018  . Gastritis and gastroduodenitis   . Symptomatic anemia   . Acute renal failure superimposed on stage 4 chronic kidney disease ([place])   . Syncope 06/21/2018  . Iron deficiency 06/21/2018  . GERD (gastroesophageal reflux disease) 10/04/2017  . Acute on chronic combined systolic and diastolic CHF (congestive heart failure) ([place]) 04/02/2017  . NICM (nonischemic cardiomyopathy) ([place]) 02/22/2017  . CKD stage G3b/A1, GFR 30-44 and albumin creatinine ratio <30 mg/g (HCC)   . Dysphagia, idiopathic 02/15/2017  . Pleural effusion on right 01/30/2017  . Osteoporosis 08/10/2016  . Liver cirrhosis secondary to NASH ([place]) 06/13/2016  . Chest pain 10/30/2014  . CKD (chronic kidney disease), stage IV ([place]) 03/07/2013  . Sinoatrial node dysfunction (HCC)   . Chronic combined systolic and diastolic CHF (congestive heart failure) ([place]) 05/07/2011  . Morbid obesity ([place]) 05/07/2011  . 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 OSA (obstructive sleep apnea) 05/05/2009  . Essential hypertension 05/04/2009  . Permanent atrial fibrillation 05/04/2009  . ALLERGIC RHINITIS 05/04/2009    Past Surgical History:  Procedure Laterality Date  .  ABDOMINAL HYSTERECTOMY  1991  . BIOPSY  09/26/2018   Procedure: BIOPSY;  Surgeon: Lavena Bullion, DO;  Location: [facility];  Service: Gastroenterology;;  . COLONOSCOPY  1998   one polyp per patient  . ESOPHAGOGASTRODUODENOSCOPY (EGD) WITH PROPOFOL N/A 09/26/2018   Procedure: ESOPHAGOGASTRODUODENOSCOPY (EGD) WITH PROPOFOL;  Surgeon: Lavena Bullion, DO;  Location: [facility];  Service: Gastroenterology;  Laterality: N/A;    . EYE SURGERY    . GIVENS CAPSULE STUDY N/A 10/09/2018   Procedure: GIVENS CAPSULE STUDY;  Surgeon: Carol Ada, MD;  Location: [facility];  Service: Endoscopy;  Laterality: N/A;  . IR THORACENTESIS ASP PLEURAL SPACE W/IMG GUIDE  10/09/2018  . PACEMAKER INSERTION  Nov 2000   St Jude with revision in 2011  . PERCUTANEOUS NEPHROLITHOTOMY  April 2012  . RIGHT HEART CATH N/A 06/28/2018   Procedure: RIGHT HEART CATH;  Surgeon: Jolaine Artist, MD;  Location: [facility];  Service: Cardiovascular;  Laterality: N/A;  . TEE WITHOUT CARDIOVERSION N/A 06/28/2018   Procedure: TRANSESOPHAGEAL ECHOCARDIOGRAM (TEE);  Surgeon: Jolaine Artist, MD;  Location: [facility];  Service: Cardiovascular;  Laterality: N/A;     OB History   No obstetric history on file.      Home Medications    Prior to Admission medications   Medication Sig Start Date End Date Taking? Authorizing Provider  allopurinol (ZYLOPRIM) 300 MG tablet Take 300 mg by mouth every evening.     [provider]  calcitRIOL (ROCALTROL) 0.25 MCG capsule Take 0.25 mcg by mouth every other day.     [provider]  COD LIVER OIL PO Take 1 capsule by mouth daily.     [provider]  docusate sodium (COLACE) 100 MG capsule Take 100 mg by mouth daily as needed for mild constipation.     [provider]  feeding supplement (BOOST HIGH PROTEIN) LIQD Take 1 Container by mouth 2 (two) times daily.     [provider]  ferrous sulfate 325 (65 FE) MG tablet Take 325 mg by mouth at bedtime.     [provider]  HYDROcodone-acetaminophen (NORCO) 10-325 MG per tablet Take 1 tablet by mouth See admin instructions. Take 1 tablet by mouth twice daily, may also take 1/2 tablet an additional 2 times during the day as needed for pain    [provider]  isosorbide mononitrate (IMDUR) 30 MG 24 hr tablet Take 0.5 tablets (15 mg total) by mouth daily. Patient taking differently: Take  15 mg by mouth at bedtime.  12/10/17   Fay Records, MD  methocarbamol (ROBAXIN) 500 MG tablet Take 1 tablet (500 mg total) by mouth every 6 (six) hours as needed for muscle spasms. 10/18/18   Shelly Coss, MD  metolazone (ZAROXOLYN) 2.5 MG tablet Take 2.5 mg by mouth as needed (fluid).     [provider]  Multiple Vitamin (MULTIVITAMIN WITH MINERALS) TABS tablet Take 1 tablet by mouth at bedtime.    [provider]  pantoprazole (PROTONIX) 40 MG tablet Take 1 tablet (40 mg total) by mouth 2 (two) times daily. 10/02/18   Mahala Menghini, PA-C  polyethylene glycol (MIRALAX / GLYCOLAX) packet Take 17 g by mouth at bedtime.    [provider]  potassium chloride SA (K-DUR,KLOR-CON) 20 MEQ tablet Take 2 tablets (40 mEq total) by mouth 2 (two) times daily for 5 days. 10/19/18 10/24/18  Shelly Coss, MD  potassium chloride SA (K-DUR,KLOR-CON) 20 MEQ tablet Start on  10/24/18 10/18/18   Shelly Coss, MD  torsemide (DEMADEX) 20 MG tablet Take 4 tablets (80 mg total) by mouth 2 (two) times daily. May also take 1 tablet (20 mg total) as needed (for weight greater than 246). 07/15/18   Clegg, Amy D, NP  vitamin B-12 (CYANOCOBALAMIN) 500 MCG tablet Take 500 mcg by mouth daily.    [provider]    Family History Family History  Problem Relation Age of Onset  . Heart attack Father   . Stroke Mother   . Diabetes Sister   . Colon cancer Other        seven family members  . Liver disease Neg Hx   . Other Neg Hx     Social History Social History   Tobacco Use  . Smoking status: Former Smoker    Packs/day: 1.00    Types: Cigarettes    Last attempt to quit: 09/26/1979    Years since quitting: 39.1  . Smokeless tobacco: Never Used  Substance Use Topics  . Alcohol use: No  . Drug use: No     Allergies   Cephalexin; Codeine; Contrast media [iodinated diagnostic agents]; Peppermint flavor; Prednisone; Tape; Ciprofloxacin; Latex; Penicillins; Coreg [carvedilol];  and Diamox [acetazolamide]   Review of Systems Review of Systems  All other systems reviewed and are negative.    Physical Exam Updated Vital Signs BP (!) 108/52 (BP Location: Left Wrist)   Pulse 64   Temp 97.9 F (36.6 C) (Oral)   Resp 18   SpO2 97%   Physical Exam Vitals signs and nursing note reviewed.  Constitutional:      General: She is not in acute distress.    Appearance: Normal appearance. She is well-developed. She is not toxic-appearing.  HENT:     Head: Normocephalic and atraumatic.  Eyes:     General: Lids are normal.     Conjunctiva/sclera: Conjunctivae normal.     Pupils: Pupils are equal, round, and reactive to light.  Neck:     Musculoskeletal: Normal range of motion and neck supple.     Thyroid: No thyroid mass.     Trachea: No tracheal deviation.  Cardiovascular:     Rate and Rhythm: Normal rate and regular rhythm.     Heart sounds: Normal heart sounds. No murmur. No gallop.   Pulmonary:     Effort: Pulmonary effort is normal. No respiratory distress.     Breath sounds: Normal breath sounds. No stridor. No decreased breath sounds, wheezing, rhonchi or rales.  Abdominal:     General: Bowel sounds are normal. There is no distension.     Palpations: Abdomen is soft.     Tenderness: There is no abdominal tenderness. There is no rebound.  Musculoskeletal: Normal range of motion.        General: No tenderness.  Skin:    General: Skin is warm and dry.     Coloration: Skin is pale.     Findings: No abrasion or rash.  Neurological:     Mental Status: She is alert and oriented to person, place, and time.     GCS: GCS eye subscore is 4. GCS verbal subscore is 5. GCS motor subscore is 6.     Cranial Nerves: No cranial nerve deficit.     Sensory: No sensory deficit.  Psychiatric:        Speech: Speech normal.        Behavior: Behavior normal.      ED Treatments / Results  Labs (all labs ordered are listed, but only abnormal results are  displayed) Labs Reviewed  COMPREHENSIVE METABOLIC PANEL  CBC  POC OCCULT BLOOD, ED  I-STAT TROPONIN, ED  TYPE AND SCREEN  PREPARE RBC (CROSSMATCH)    EKG None  Radiology No results found.  Procedures Procedures (including critical care time)  Medications Ordered in ED Medications  0.9 %  sodium chloride infusion (has no administration in time range)     Initial Impression / Assessment and Plan / ED Course  I have reviewed the triage vital signs and the nursing notes.  Pertinent labs & imaging results that were available during my care of the patient were reviewed by me and considered in my medical decision making (see chart for details).     Patient with no hemoglobin of 5.  Will transfuse with 2 units of packed red blood cells and admit to the hospitalist service  Patient also has severe hypokalemia and treated with oral potassium as well as IV potassium.  Blood pressure stable here.  CRITICAL CARE Performed by: Leota Jacobsen Total critical care time: 50 minutes Critical care time was exclusive of separately billable procedures and treating other patients. Critical care was necessary to treat or prevent imminent or life-threatening deterioration. Critical care was time spent personally by me on the following activities: development of treatment plan with patient and/or surrogate as well as nursing, discussions with consultants, evaluation of patient's response to treatment, examination of patient, obtaining history from patient or surrogate, ordering and performing treatments and interventions, ordering and review of laboratory studies, ordering and review of radiographic studies, pulse oximetry and re-evaluation of patient's condition.   Final Clinical Impressions(s) / ED Diagnoses   Final diagnoses:  None    ED Discharge Orders    None       Lacretia Leigh, MD 11/05/18 [place] Iha    Lacretia Leigh, MD 11/05/18 2033

## 2018-11-05 NOTE — Progress Notes (Signed)
Rt spoke with pt family about cpap order. Pt has brought in home unit and will be placed on when pt is ready.  Family may place device or they may call for RT to place if family is no longer here. RT will monitor.

## 2018-11-05 NOTE — H&P (Signed)
History and Physical    [name] [name] SRP:[id number] DOB: [birth year] DOA: 11/05/2018  PCP: [name], MD  Chief Complaint: Patient sent here from kidney center for evaluation of abnormal labs.  HPI: [name] [name] is a 73 y.o. 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  Patient reports having fatigue and dyspnea for the past 2 days.  Denies having any hematemesis, hematochezia, or melena.  Denies having any abdominal pain.  States her gastroenterologist is Dr. Chartered loss adjuster in [place].  Reports having history of angina but states she has been having intermittent substernal chest pressure at rest lasting 30 to 40 seconds for the past few days.  Daughter at bedside states patient takes torsemide and metolazone for her lower extremity edema.  Review of Systems: As per HPI otherwise 10 point review of systems negative.  Past Medical History:  Diagnosis Date  . Anemia   . Atrial fibrillation ([place])   . Cataract   . Cervical cancer ([place]) 1991   s/p hysterectomy  . Chronic cellulitis   . Chronic combined systolic and diastolic congestive heart failure, NYHA class 2 (HCC)    LVEF 25-30% with restrictive diastolic filling  . Degenerative joint disease   . Diverticulitis   . Essential hypertension   . GERD (gastroesophageal reflux disease)   . Gout   . Kidney stones   . Morbid obesity ([place])   . NICM (nonischemic cardiomyopathy) ([place]) 02/22/2017  . Osteoarthritis   . Permanent atrial fibrillation 05/04/2009   Qualifier: History of  By: Quentin Cornwall CMA, Janett Billow    . 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   Qualifier: Diagnosis of  By: Lovena Le, MD, [facility], Binnie Kand   .  Sinoatrial node dysfunction [facility])    Status post PPM - Dr. Lovena Le  . Sleep apnea    CPAP    Past Surgical History:  Procedure Laterality Date  . ABDOMINAL HYSTERECTOMY  1991  . BIOPSY  09/26/2018   Procedure: BIOPSY;  Surgeon: Lavena Bullion, DO;  Location: [facility];  Service: Gastroenterology;;  . COLONOSCOPY  1998   one polyp per patient  . ESOPHAGOGASTRODUODENOSCOPY (EGD) WITH PROPOFOL N/A 09/26/2018   Procedure: ESOPHAGOGASTRODUODENOSCOPY (EGD) WITH PROPOFOL;  Surgeon: Lavena Bullion, DO;  Location: [place];  Service: Gastroenterology;  Laterality: N/A;  . EYE SURGERY    . GIVENS CAPSULE STUDY N/A 10/09/2018   Procedure: GIVENS CAPSULE STUDY;  Surgeon: Carol Ada, MD;  Location: [place];  Service: Endoscopy;  Laterality: N/A;  . IR THORACENTESIS ASP PLEURAL SPACE W/IMG GUIDE  10/09/2018  . PACEMAKER INSERTION  Nov 2000   St Jude with revision in 2011  . PERCUTANEOUS NEPHROLITHOTOMY  April 2012  . RIGHT HEART CATH N/A 06/28/2018   Procedure: RIGHT HEART CATH;  Surgeon: Jolaine Artist, MD;  Location: [facility];  Service: Cardiovascular;  Laterality: N/A;  . TEE WITHOUT CARDIOVERSION N/A 06/28/2018   Procedure: TRANSESOPHAGEAL ECHOCARDIOGRAM (TEE);  Surgeon: Jolaine Artist, MD;  Location: [facility] ENDOSCOPY;  Service: Cardiovascular;  Laterality: N/A;     reports that she quit smoking about 39 years ago. Her smoking use included cigarettes. She smoked 1.00 pack per day. She has never used smokeless tobacco. She reports that she does not drink alcohol or use drugs.  Allergies  Allergen Reactions  . Beta Adrenergic Blockers Anaphylaxis and Other (See Comments)    "DEATH" (Bradycardia and her organs shut down)  . Cephalexin Shortness Of Breath  . Codeine Anaphylaxis and Swelling    Throat swelling  . Contrast Media [Iodinated Diagnostic Agents] Other (See Comments)    Patient states "I have chronic kidney disease, so the doctor said no dye in my  veins."  . Coreg [Carvedilol] Other (See Comments)    Beta Blockers cause her organs to shut down (per patient)  . Peppermint Flavor Shortness Of Breath  . Prednisone Anaphylaxis, Shortness Of Breath and Swelling    Throat swells   . Tape Other (See Comments)    States plastic tape blisters her skin  . Ciprofloxacin Hives    Tolerating levofloxin   . Latex Itching and Rash  . Penicillins Hives    DID THE REACTION INVOLVE: Swelling of the face/tongue/throat, SOB, or low BP? Yes Sudden or severe rash/hives, skin peeling, or the inside of the mouth or nose? Yes Did it require medical treatment? Pt was in hospital at the time of reaction When did it last happen?Was in her mid-40's If all above answers are "NO", may proceed with cephalosporin use.  . Diamox [Acetazolamide] Rash    Rash after 2 doses of diamox     Family History  Problem Relation Age of Onset  . Heart attack Father   . Stroke Mother   . Diabetes Sister   . Colon cancer Other        seven family members  . Liver disease Neg Hx   . Other Neg Hx     Prior to Admission medications   Medication Sig Start Date End Date Taking? Authorizing Provider  allopurinol (ZYLOPRIM) 300 MG tablet Take 300 mg by mouth every evening.     [provider]  calcitRIOL (ROCALTROL) 0.25 MCG capsule Take 0.25 mcg by mouth every other day.     [provider]  COD LIVER OIL PO Take 1 capsule by mouth daily.     [provider]  docusate sodium (COLACE) 100 MG capsule Take 100 mg by mouth daily as needed for mild constipation.     [provider]  feeding supplement (BOOST HIGH PROTEIN) LIQD Take 1 Container by mouth 2 (two) times daily.     [provider]  ferrous sulfate 325 (65 FE) MG tablet Take 325 mg by mouth at bedtime.     [provider]  HYDROcodone-acetaminophen (NORCO) 10-325 MG per tablet Take 1 tablet by mouth See admin instructions. Take 1 tablet by mouth twice daily, may  also take 1/2 tablet an additional 2 times during the day as needed for pain    [provider]  isosorbide mononitrate (IMDUR) 30 MG 24 hr tablet Take 0.5 tablets (15 mg total) by mouth daily. Patient taking differently: Take 15 mg by mouth at bedtime.  12/10/17   Fay Records, MD  methocarbamol (ROBAXIN) 500 MG tablet Take 1 tablet (500 mg total) by mouth every 6 (six) hours as needed for muscle spasms. 10/18/18   Shelly Coss, MD  metolazone (ZAROXOLYN) 2.5 MG tablet Take 2.5 mg by mouth as needed (fluid).     [provider]  Multiple Vitamin (MULTIVITAMIN WITH MINERALS) TABS tablet Take 1 tablet by mouth at bedtime.    [provider]  pantoprazole (PROTONIX) 40 MG tablet Take 1 tablet (40 mg total) by mouth 2 (two) times daily. 10/02/18  Mahala Menghini, PA-C  polyethylene glycol (MIRALAX / GLYCOLAX) packet Take 17 g by mouth at bedtime.    [provider]  potassium chloride SA (K-DUR,KLOR-CON) 20 MEQ tablet Take 2 tablets (40 mEq total) by mouth 2 (two) times daily for 5 days. 10/19/18 10/24/18  Shelly Coss, MD  potassium chloride SA (K-DUR,KLOR-CON) 20 MEQ tablet Start on 10/24/18 10/18/18   Shelly Coss, MD  torsemide (DEMADEX) 20 MG tablet Take 4 tablets (80 mg total) by mouth 2 (two) times daily. May also take 1 tablet (20 mg total) as needed (for weight greater than 246). 07/15/18   Clegg, Amy D, NP  vitamin B-12 (CYANOCOBALAMIN) 500 MCG tablet Take 500 mcg by mouth daily.    [provider]    Physical Exam: Vitals:   11/05/18 1945 11/05/18 2045 11/05/18 2100 11/05/18 2115  BP: (!) 110/59 (!) 103/54 100/66 (!) 107/58  Pulse: 91 91 95 92  Resp: 19 (!) 24 (!) 26 (!) 22  Temp:      TempSrc:      SpO2: 96% 99% 99% 97%    Physical Exam  Constitutional: She is oriented to person, place, and time. She appears well-developed and well-nourished.  HENT:  Head: Normocephalic.  Mouth/Throat: Oropharynx is clear and moist.  Eyes: Right  eye exhibits no discharge. Left eye exhibits no discharge.  Neck: Neck supple. JVD present.  Cardiovascular: Normal rate, regular rhythm and intact distal pulses.  Pulmonary/Chest: Effort normal. No respiratory distress. She has no wheezes.  Mild rales appreciated at the bases  Abdominal: Soft. Bowel sounds are normal. She exhibits no distension. There is abdominal tenderness. There is guarding. There is no rebound.  Epigastrium tender to palpation with guarding.  No rebound or rigidity.  Musculoskeletal:        General: Edema present.     Comments: Significant edema and chronic venous stasis changes of bilateral lower extremities  Neurological: She is alert and oriented to person, place, and time.  Skin: Skin is warm and dry. She is not diaphoretic.     Labs on Admission: I have personally reviewed following labs and imaging studies  CBC: Recent Labs  Lab 11/05/18 1934  WBC 6.1  HGB 5.2*  HCT 16.8*  MCV 100.0  PLT 099   Basic Metabolic Panel: Recent Labs  Lab 11/05/18 1934  NA 132*  K 2.4*  CL 84*  CO2 29  GLUCOSE 169*  BUN 184*  CREATININE 3.13*  CALCIUM 9.3   GFR: CrCl cannot be calculated (Unknown ideal weight.). Liver Function Tests: Recent Labs  Lab 11/05/18 1934  AST 22  ALT 14  ALKPHOS 37*  BILITOT 0.8  PROT 5.8*  ALBUMIN 3.2*   No results for input(s): LIPASE, AMYLASE in the last 168 hours. No results for input(s): AMMONIA in the last 168 hours. Coagulation Profile: No results for input(s): INR, PROTIME in the last 168 hours. Cardiac Enzymes: No results for input(s): CKTOTAL, CKMB, CKMBINDEX, TROPONINI in the last 168 hours. BNP (last 3 results) No results for input(s): PROBNP in the last 8760 hours. HbA1C: No results for input(s): HGBA1C in the last 72 hours. CBG: No results for input(s): GLUCAP in the last 168 hours. Lipid Profile: No results for input(s): CHOL, HDL, LDLCALC, TRIG, CHOLHDL, LDLDIRECT in the last 72 hours. Thyroid Function  Tests: No results for input(s): TSH, T4TOTAL, FREET4, T3FREE, THYROIDAB in the last 72 hours. Anemia Panel: No results for input(s): VITAMINB12, FOLATE, FERRITIN, TIBC, IRON, RETICCTPCT in the last 72  hours. Urine analysis:    Component Value Date/Time   COLORURINE YELLOW 10/06/2018 1254   APPEARANCEUR CLEAR 10/06/2018 1254   LABSPEC 1.008 10/06/2018 1254   PHURINE 6.0 10/06/2018 1254   GLUCOSEU NEGATIVE 10/06/2018 1254   HGBUR NEGATIVE 10/06/2018 1254   BILIRUBINUR NEGATIVE 10/06/2018 1254   KETONESUR NEGATIVE 10/06/2018 1254   PROTEINUR NEGATIVE 10/06/2018 1254   UROBILINOGEN 0.2 03/06/2013 2035   NITRITE NEGATIVE 10/06/2018 1254   LEUKOCYTESUR TRACE (A) 10/06/2018 1254    Radiological Exams on Admission: No results found.  EKG: Independently reviewed.  Paced rhythm.  Assessment/Plan Principal Problem:   Symptomatic anemia Active Problems:   Chronic combined systolic and diastolic CHF (congestive heart failure) (HCC)   Chest pain   AKI (acute kidney injury) (HCC)   Hypokalemia   Symptomatic anemia Presenting with a 2-day history of fatigue, dyspnea, and chest pain.  Hemodynamically stable.  Hemoglobin 5.2, was 8.1 on labs done 2 weeks ago.  FOBT positive.  No hematemesis, hematochezia, or melena.  Patient is not complaining of abdominal pain but noted to be guarding when epigastrium was palpated. EGD done September 26, 2018 with evidence of antral erosive gastritis. Capsule endoscopy done on October 11, 2018 without evidence of overt bleeding. -Type and screen -2 large-bore IVs -2 units PRBCs ordered in the ED -IV Protonix 40 mg twice daily -Will hold off giving IV fluid as she is not hypotensive and has signs of volume overload on exam. -Continue to monitor CBC -Abdominal x-ray -Patient is currently hemodynamically stable and not having BBMs.  Consult GI in the morning.  Hypokalemia Potassium 2.4.  Likely secondary to home diuretic use. -Replete potassium -Check  magnesium level -Continue to monitor BMP -Hold diuretics  Chest pain. Likely secondary to severe anemia.  EKG with paced rhythm making it difficult to interpret. Troponin negative.  Not complaining of chest pain at present. -Trend troponin -Cardiac monitoring  Mild AKI on CKD IV Creatinine 3.1, was 2.8 two weeks ago.  Likely related to home diuretic use. -Will hold off giving IV fluid as patient is receiving blood and already appears volume overloaded on exam. -Avoid nephrotoxic agents/contrast -Continue to monitor renal function -Daughter is adamantly requesting nephrology consultation in the hospital.  Please speak to nephrology in the morning.  Chronic combined systolic and diastolic congestive heart failure Echo done January 2020 showing EF 30 to 35%.  Appears volume loaded with JVD and significant bilateral lower extremity edema.  Mild rales appreciated on auscultation of lungs but patient is breathing comfortably on room air and is not hypoxic. -Chest x-ray -BNP -Hold diuretics at this time due to worsening renal function -Daughter is adamantly requesting cardiology consultation in the hospital.  Please speak to cardiology in the morning.  OSA -CPAP at night  Paroxysmal atrial fibrillation [id number] 4.  Has a permanent pacemaker.  Not on anticoagulation or rate control agent. -Cardiac monitoring  History of SA node dysfunction Has a pacemaker.  DVT prophylaxis: SCDs Code Status: DNR.  Discussed with patient and daughter at bedside. Family Communication: Daughter at bedside. Disposition Plan: Anticipate discharge after clinical improvement. Consults called: None Admission status: Observation, telemetry   Shela Leff MD [facility] Pager [phone]  If 7PM-7AM, please contact night-coverage [email or web address] Password [facility]  11/05/2018, 10:03 PM

## 2018-11-05 NOTE — ED Triage Notes (Signed)
Pt sent from kidney center for abnormal labs. Hgb reported to be 5.2 and potassium of 2.9. Pt c.o generalized fatigue. Pale.

## 2018-11-05 NOTE — ED Notes (Signed)
Please call daughter Marcie Bal [phone] whenever pt goes to room

## 2018-11-06 ENCOUNTER — Encounter (HOSPITAL_COMMUNITY): Payer: Self-pay | Admitting: *Deleted

## 2018-11-06 DIAGNOSIS — I130 Hypertensive heart and chronic kidney disease with heart failure and stage 1 through stage 4 chronic kidney disease, or unspecified chronic kidney disease: Secondary | ICD-10-CM | POA: Diagnosis not present

## 2018-11-06 DIAGNOSIS — K922 Gastrointestinal hemorrhage, unspecified: Secondary | ICD-10-CM | POA: Diagnosis not present

## 2018-11-06 DIAGNOSIS — T502X5A Adverse effect of carbonic-anhydrase inhibitors, benzothiadiazides and other diuretics, initial encounter: Secondary | ICD-10-CM | POA: Diagnosis present

## 2018-11-06 DIAGNOSIS — I5040 Unspecified combined systolic (congestive) and diastolic (congestive) heart failure: Secondary | ICD-10-CM | POA: Diagnosis not present

## 2018-11-06 DIAGNOSIS — G4733 Obstructive sleep apnea (adult) (pediatric): Secondary | ICD-10-CM | POA: Diagnosis present

## 2018-11-06 DIAGNOSIS — K5900 Constipation, unspecified: Secondary | ICD-10-CM | POA: Diagnosis not present

## 2018-11-06 DIAGNOSIS — D649 Anemia, unspecified: Secondary | ICD-10-CM | POA: Diagnosis present

## 2018-11-06 DIAGNOSIS — I110 Hypertensive heart disease with heart failure: Secondary | ICD-10-CM | POA: Diagnosis not present

## 2018-11-06 DIAGNOSIS — K929 Disease of digestive system, unspecified: Secondary | ICD-10-CM | POA: Diagnosis not present

## 2018-11-06 DIAGNOSIS — I482 Chronic atrial fibrillation, unspecified: Secondary | ICD-10-CM | POA: Diagnosis not present

## 2018-11-06 DIAGNOSIS — Z9689 Presence of other specified functional implants: Secondary | ICD-10-CM | POA: Diagnosis not present

## 2018-11-06 DIAGNOSIS — K429 Umbilical hernia without obstruction or gangrene: Secondary | ICD-10-CM | POA: Diagnosis present

## 2018-11-06 DIAGNOSIS — R195 Other fecal abnormalities: Secondary | ICD-10-CM | POA: Diagnosis not present

## 2018-11-06 DIAGNOSIS — I209 Angina pectoris, unspecified: Secondary | ICD-10-CM | POA: Diagnosis present

## 2018-11-06 DIAGNOSIS — D631 Anemia in chronic kidney disease: Secondary | ICD-10-CM | POA: Diagnosis not present

## 2018-11-06 DIAGNOSIS — K219 Gastro-esophageal reflux disease without esophagitis: Secondary | ICD-10-CM | POA: Diagnosis present

## 2018-11-06 DIAGNOSIS — N184 Chronic kidney disease, stage 4 (severe): Secondary | ICD-10-CM | POA: Diagnosis not present

## 2018-11-06 DIAGNOSIS — I959 Hypotension, unspecified: Secondary | ICD-10-CM | POA: Diagnosis not present

## 2018-11-06 DIAGNOSIS — E876 Hypokalemia: Secondary | ICD-10-CM | POA: Diagnosis present

## 2018-11-06 DIAGNOSIS — E611 Iron deficiency: Secondary | ICD-10-CM | POA: Diagnosis present

## 2018-11-06 DIAGNOSIS — I5042 Chronic combined systolic (congestive) and diastolic (congestive) heart failure: Secondary | ICD-10-CM | POA: Diagnosis not present

## 2018-11-06 DIAGNOSIS — I495 Sick sinus syndrome: Secondary | ICD-10-CM | POA: Diagnosis not present

## 2018-11-06 DIAGNOSIS — I872 Venous insufficiency (chronic) (peripheral): Secondary | ICD-10-CM | POA: Diagnosis present

## 2018-11-06 DIAGNOSIS — R531 Weakness: Secondary | ICD-10-CM | POA: Diagnosis not present

## 2018-11-06 DIAGNOSIS — I272 Pulmonary hypertension, unspecified: Secondary | ICD-10-CM | POA: Diagnosis present

## 2018-11-06 DIAGNOSIS — J918 Pleural effusion in other conditions classified elsewhere: Secondary | ICD-10-CM | POA: Diagnosis not present

## 2018-11-06 DIAGNOSIS — I083 Combined rheumatic disorders of mitral, aortic and tricuspid valves: Secondary | ICD-10-CM | POA: Diagnosis present

## 2018-11-06 DIAGNOSIS — I5023 Acute on chronic systolic (congestive) heart failure: Secondary | ICD-10-CM | POA: Diagnosis not present

## 2018-11-06 DIAGNOSIS — R079 Chest pain, unspecified: Secondary | ICD-10-CM | POA: Diagnosis not present

## 2018-11-06 DIAGNOSIS — K2971 Gastritis, unspecified, with bleeding: Secondary | ICD-10-CM | POA: Diagnosis not present

## 2018-11-06 DIAGNOSIS — I4821 Permanent atrial fibrillation: Secondary | ICD-10-CM | POA: Diagnosis not present

## 2018-11-06 DIAGNOSIS — I5043 Acute on chronic combined systolic (congestive) and diastolic (congestive) heart failure: Secondary | ICD-10-CM | POA: Diagnosis not present

## 2018-11-06 DIAGNOSIS — J90 Pleural effusion, not elsewhere classified: Secondary | ICD-10-CM | POA: Diagnosis not present

## 2018-11-06 DIAGNOSIS — N179 Acute kidney failure, unspecified: Secondary | ICD-10-CM | POA: Diagnosis not present

## 2018-11-06 DIAGNOSIS — K7581 Nonalcoholic steatohepatitis (NASH): Secondary | ICD-10-CM | POA: Diagnosis not present

## 2018-11-06 DIAGNOSIS — Z6841 Body Mass Index (BMI) 40.0 and over, adult: Secondary | ICD-10-CM | POA: Diagnosis not present

## 2018-11-06 DIAGNOSIS — I428 Other cardiomyopathies: Secondary | ICD-10-CM | POA: Diagnosis not present

## 2018-11-06 DIAGNOSIS — D62 Acute posthemorrhagic anemia: Secondary | ICD-10-CM | POA: Diagnosis not present

## 2018-11-06 LAB — BASIC METABOLIC PANEL
Anion gap: 19 — ABNORMAL HIGH (ref 5–15)
BUN: 188 mg/dL — ABNORMAL HIGH (ref 8–23)
CO2: 27 mmol/L (ref 22–32)
Calcium: 9 mg/dL (ref 8.9–10.3)
Chloride: 89 mmol/L — ABNORMAL LOW (ref 98–111)
Creatinine, Ser: 3.32 mg/dL — ABNORMAL HIGH (ref 0.44–1.00)
GFR calc Af Amer: 15 mL/min — ABNORMAL LOW (ref >60)
GFR, EST NON AFRICAN AMERICAN: 13 mL/min — AB (ref >60)
Glucose, Bld: 145 mg/dL — ABNORMAL HIGH (ref 70–99)
POTASSIUM: 3.7 mmol/L (ref 3.5–5.1)
Sodium: 135 mmol/L (ref 135–145)

## 2018-11-06 LAB — CBC
HCT: 19.7 % — ABNORMAL LOW (ref 36.0–46.0)
Hemoglobin: 6.2 g/dL — CL (ref 12.0–15.0)
MCH: 30.8 pg (ref 26.0–34.0)
MCHC: 31.5 g/dL (ref 30.0–36.0)
MCV: 98 fL (ref 80.0–100.0)
PLATELETS: 159 10*3/uL (ref 150–400)
RBC: 2.01 MIL/uL — ABNORMAL LOW (ref 3.87–5.11)
RDW: 20.2 % — AB (ref 11.5–15.5)
WBC: 7.4 10*3/uL (ref 4.0–10.5)
nRBC: 0 % (ref 0.0–0.2)

## 2018-11-06 LAB — TROPONIN I: Troponin I: 0.06 ng/mL (ref <0.03)

## 2018-11-06 LAB — POTASSIUM: Potassium: 2.8 mmol/L — ABNORMAL LOW (ref 3.5–5.1)

## 2018-11-06 MED ORDER — PANTOPRAZOLE SODIUM 40 MG PO TBEC
40.0000 mg | DELAYED_RELEASE_TABLET | Freq: Two times a day (BID) | ORAL | Status: DC
  Administered 2018-11-06 – 2018-11-18 (×24): 40 mg via ORAL
  Filled 2018-11-06 (×24): qty 1

## 2018-11-06 MED ORDER — POTASSIUM CHLORIDE CRYS ER 20 MEQ PO TBCR
60.0000 meq | EXTENDED_RELEASE_TABLET | Freq: Once | ORAL | Status: AC
  Administered 2018-11-06: 60 meq via ORAL
  Filled 2018-11-06: qty 3

## 2018-11-06 MED ORDER — HYDROCODONE-ACETAMINOPHEN 10-325 MG PO TABS
1.0000 | ORAL_TABLET | Freq: Two times a day (BID) | ORAL | Status: DC | PRN
  Administered 2018-11-06 – 2018-11-12 (×8): 1 via ORAL
  Filled 2018-11-06 (×8): qty 1

## 2018-11-06 NOTE — Progress Notes (Signed)
On arrival patient is already on her home unit CPAP. Tolerating it well.

## 2018-11-06 NOTE — Progress Notes (Signed)
PROGRESS NOTE    [name] [name]  RFF:[id number] DOB: [birth year] DOA: 11/05/2018 PCP: [name], MD   Brief Narrative: HPI as per [name]: 73 y.o. 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  Patient reports having fatigue and dyspnea for the past 2 days.  Denies having any hematemesis, hematochezia, or melena.  Denies having any abdominal pain.  States her gastroenterologist is Dr. Chartered loss adjuster in [place].  Reports having history of angina but states she has been having intermittent substernal chest pressure at rest lasting 30 to 40 seconds for the past few days.  Daughter at bedside states patient takes torsemide and metolazone for her lower extremity edema.  Subjective: Patient resting comfortably.  Denies any chest pain nausea vomiting shortness of breath.  No bowel movement yet last bowel movement was yesterday 4 pm  And reports it was normal. Complains of diffuse abdominal discomfort.  Finished 1 unit of PRBC, awaiting for second unit as patient had difficult with obtaining compatible blood. CBC/BMP is being drawn at noon today.  Patient was 2.8 and was repleted overnight.  Last hemoglobin 5.2 g prior to blood transfusion. Creat was 3.13- new pending. Patient wanting to sit on the bedside chair, I requested her to be on complete bedrest until she gets 2 units of blood.  Assessment & Plan:   Symptomatic anemia: Suspecting in the setting of anemia of chronic disease/renal disease status post first unit PRBC transfusion hemoglobin is still low at 6.2, waiting for second unit PRBC.  Patient denies any rectal bleeding hematemesis.  She is hemodynamically stable.  He had recent  extensive work-up including EGD and capsule endoscopy.  Reports her bowel movement was regular: But was heme positive in the ER.  Have notified and consulted gastroenterology.  Continue to monitor serial H&H and transfuse as needed.  Continue p.o. Protonix twice daily as per GI.  If patient become symptomatic with shortness of breath we can try Lasix.  Chronic combined systolic and diastolic CHF: Compensated.  She is on room air currently.  It is a chronic appearing leg edema with chronic hyperpigmentation.  Her EF was 30% on January 2020. No SOB. Diuretics on hold due to AKI.  Chest pain, likely in the setting of severe anemia.  Troponin is at 0.05- 0.06 and flat.  EKG was paced rhythm.  Per request consulted cardiology given patient's reluctance for colonoscopy without seeing cardiology. cardiology master informed  AKI on CKD stage IV, baseline creatinine 2.8, 2 weeks ago, currently at 3.3 from 3.1.  Patient is known to [place] [facility] Dr Deterding.  Hypokalemia: Was repleted aggressively and repeat potassium stable at 3.7  PAF chads2vas scoreecmd:o pacemaker in place.  She is not on anticoagulation or rate controlling agent.  OSA on CPAP nightly  Hx of SA node dysfunction with pacemaker in place.  Patient will need at least 2 midnight stay to manage her care as above with blood transfusion, serial monitoring of hemoglobin, treatment of her electrolyte imbalance.  We will change her to inpatient status.  DVT prophylaxis: SCD Code Status: full Family Communication:updated pt's daughter over the phone. Disposition Plan: Remains inpatient pending clinical improvement. Overall prognosis remains guarded.  Patient will be monitored continuously.  I have updated patient's daughter over the phone.  Consultants:  Gastroenterology consulted. Cardiology  Procedures:  Antimicrobials: Anti-infectives (From admission, onward)   None       Objective: Vitals:   11/06/18 0458 11/06/18 0527  11/06/18 0542 11/06/18 0848  BP: 116/63  (!) 92/58 (!) 108/58  Pulse: (!) 105  96 99  Resp: 18  20 (!) 24  Temp:  98.3 F (36.8 C) 98.3 F (36.8 C) 98.4 F (36.9 C)  TempSrc:   Oral Oral  SpO2: 98%  99% 97%  Weight:      Height:        Intake/Output Summary (Last 24 hours) at 11/06/2018 1128 Last data filed at 11/06/2018 1101 Gross per 24 hour  Intake 530 ml  Output 850 ml  Net -320 ml   Filed Weights   11/06/18 0441  Weight: 111 kg   Weight change:   Body mass index is 39.5 kg/m.  Intake/Output from previous day: 02/11 0701 - 02/12 0700 In: 200 [I.V.:100; IV Piggyback:100] Out: 500 [Urine:500] Intake/Output this shift: Total I/O In: 330 [Blood:330] Out: 350 [Urine:350]  Examination:  General exam: Appears calm and comfortable,Not in distress, older fore the age HEENT:PERRL,Oral mucosa moist, Ear/Nose normal on gross exam Respiratory system: Bilateral equal air entry, normal vesicular breath sounds, no wheezes or crackles  Cardiovascular system: S1 & S2 heard,No JVD, murmurs. Gastrointestinal system: Abdomen is  soft, non tender, non distended, BS +  Nervous System:Alert and oriented. No focal neurological deficits/moving extremities, sensation intact. Extremities: No edema, no clubbing, distal peripheral pulses palpable. Skin: No rashes, lesions, no icterus MSK: Normal muscle bulk,tone ,power  Medications:  Scheduled Meds: . pantoprazole (PROTONIX) IV  40 mg Intravenous Q12H   Continuous Infusions: . sodium chloride      Data Reviewed: I have personally reviewed following labs and imaging studies  CBC: Recent Labs  Lab 11/05/18 1934  WBC 6.1  HGB 5.2*  HCT 16.8*  MCV 100.0  PLT 676   Basic Metabolic Panel: Recent Labs  Lab 11/05/18 1934 11/05/18 2224 11/05/18 2343  NA 132*  --   --   K 2.4*  --  2.8*  CL 84*  --   --   CO2 29  --   --   GLUCOSE 169*  --   --   BUN 184*  --   --   CREATININE 3.13*  --   --   CALCIUM 9.3  --   --   MG   --  3.0*  --    GFR: Estimated Creatinine Clearance: 20.2 mL/min (A) (by C-G formula based on SCr of 3.13 mg/dL (H)). Liver Function Tests: Recent Labs  Lab 11/05/18 1934  AST 22  ALT 14  ALKPHOS 37*  BILITOT 0.8  PROT 5.8*  ALBUMIN 3.2*   No results for input(s): LIPASE, AMYLASE in the last 168 hours. No results for input(s): AMMONIA in the last 168 hours. Coagulation Profile: No results for input(s): INR, PROTIME in the last 168 hours. Cardiac Enzymes: Recent Labs  Lab 11/05/18 2224  TROPONINI 0.05*   BNP (last 3 results) No results for input(s): PROBNP in the last 8760 hours. HbA1C: No results for input(s): HGBA1C in the last 72 hours. CBG: No results for input(s): GLUCAP in the last 168 hours. Lipid Profile: No results for input(s): CHOL, HDL, LDLCALC, TRIG, CHOLHDL, LDLDIRECT in the last 72 hours. Thyroid Function Tests: No results for input(s): TSH, T4TOTAL, FREET4, T3FREE, THYROIDAB in the last 72 hours. Anemia Panel: No results for input(s): VITAMINB12, FOLATE,  FERRITIN, TIBC, IRON, RETICCTPCT in the last 72 hours. Sepsis Labs: No results for input(s): PROCALCITON, LATICACIDVEN in the last 168 hours.  No results found for this or any previous visit (from the past 240 hour(s)).    Radiology Studies: Dg Chest 2 View  Result Date: 11/05/2018 CLINICAL DATA:  Weakness.  Dyspnea on exertion. EXAM: CHEST - 2 VIEW COMPARISON:  10/09/2018 FINDINGS: Left chest wall single lead pacemaker unchanged. Moderate cardiomegaly with medium-sized right pleural effusion, greater than on the previous exam. Associated atelectasis of the right lung base. The left lung is clear. IMPRESSION: Moderate cardiomegaly with intermediate sized right pleural effusion and associated atelectasis. Electronically Signed   By: Ulyses Jarred M.D.   On: 11/05/2018 22:39   Dg Abd 2 Views  Result Date: 11/05/2018 CLINICAL DATA:  Dyspnea on exertion. Anemia. EXAM: ABDOMEN - 2 VIEW COMPARISON:  None.  FINDINGS: No dilated small bowel or free intraperitoneal air. There is multilevel lumbar spine degenerative change with mild levoscoliosis. No acute osseous abnormality. IMPRESSION: No radiographic evidence of small bowel obstruction. Electronically Signed   By: Ulyses Jarred M.D.   On: 11/05/2018 22:40      LOS: 0 days   Time spent: More than 50% of that time was spent in counseling and/or coordination of care.  Antonieta Pert, MD [facility]  11/06/2018, 11:28 AM

## 2018-11-06 NOTE — Consult Note (Signed)
[facility] Consult: 11:31 AM 11/06/2018  LOS: 0 days    Referring Provider: Dr Maren Beach  Primary Care Physician:  [name], MD Primary Gastroenterologist:  Dr. Barney Drain     Reason for Consultation:  Recurrent acute on chronic anemia.     HPI: [name] [name] is a 73 y.o. female.  Medical issues including CKD stage 3-4.  Systolic/diastolic CHF with severe mitral regurgitation.  Morbid obesity.  OSA.  Pulmonary hypertension.  A. fib.  Status post AV node ablation, status post PPM pacemaker.  Nash cirrhosis.  Her GI issues are followed by Dr. Lovey Newcomer fields.  She was admitted twice in January 2020 for acute on chronic anemia.  She has chronically dark stools due to daily oral iron  09/26/2018 EGD.  Dr.Cirigliano.  to evaluate FOBT+, IDA, NASH cirrhosis, rule out varices.  MD found antral erythema with erosions.  Biopsy of duodenum showed no sprue or pathology.  Gastric biopsies showed slight, chronic inflammation, no H. pylori. 10/11/2018 capsule endoscopy.  This was read by Dr. Benson Norway.  There was rapid transit through the gastric lumen but no evidence of any bleeding.  Small bowel normal.  No source for bleeding encountered. Abdominal ultrasound 08/29/2018 done in [place] showed 21 cm long liver.  Cirrhosis.  No focal liver lesions.  Gallstone.  Mild gallbladder wall thickening distended IVC and small right pleural effusion suggesting possible right heart decompensation.  She received 2 units PRBC during her most recent admission and 4 units during admission early 09/2018  Latest colonoscopy 1998 showed a diminutive rectal polyp, small internal hemorrhoids, mild sigmoid diverticulosis.  Unable to locate pathology report on the polyp. Her pulmonologist, cardiologist and PMD have advised her not to undergo repeat  screening colonoscopy because of her cardiopulmonary risk.  She is readmitted for the third time in 6 weeks for Hgb 5.2, it was 8.1 on 10/17/2018. 1 of 2 PRBCs transfused.  FOBT +.  She has had weakness but not dizziness.  Last Friday she had gained 7 pounds, having increased lower extremity edema, was short of breath.  Dr. Haroldine Laws advised her to take extra doses of metolazone which helped.  Follow-up labs this week per PMD showed recurrent anemia and she was advised go to the ED. Stools are dark brown per usual.  No bleeding, no melena.  She reports periodic nausea but no emesis, this is chronic.  Also reports some epigastric discomfort.    Past Medical History:  Diagnosis Date  . Anemia   . Atrial fibrillation ([place])   . Cataract   . Cervical cancer ([place]) 1991   s/p hysterectomy  . Chronic cellulitis   . Chronic combined systolic and diastolic congestive heart failure, NYHA class 2 (HCC)    LVEF 25-30% with restrictive diastolic filling  . Degenerative joint disease   . Diverticulitis   . Essential hypertension   . GERD (gastroesophageal reflux disease)   . Gout   . Kidney stones   . Morbid obesity ([place])   . NICM (nonischemic cardiomyopathy) ([place]) 02/22/2017  . Osteoarthritis   .  Permanent atrial fibrillation 05/04/2009   Qualifier: History of  By: Quentin Cornwall CMA, Janett Billow    . 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   Qualifier: Diagnosis of  By: Lovena Le, MD, [facility], Binnie Kand   . Sinoatrial node dysfunction [facility])    Status post PPM - Dr. Lovena Le  . Sleep apnea    CPAP    Past Surgical History:  Procedure Laterality Date  . ABDOMINAL HYSTERECTOMY  1991  . BIOPSY  09/26/2018   Procedure: BIOPSY;  Surgeon: Lavena Bullion, DO;  Location: [facility];  Service: Gastroenterology;;  . COLONOSCOPY  1998   one polyp per patient  . ESOPHAGOGASTRODUODENOSCOPY (EGD) WITH PROPOFOL N/A 09/26/2018   Procedure: ESOPHAGOGASTRODUODENOSCOPY (EGD) WITH PROPOFOL;  Surgeon: Lavena Bullion, DO;  Location: [place];  Service: Gastroenterology;  Laterality: N/A;  . EYE SURGERY    . GIVENS CAPSULE STUDY N/A 10/09/2018   Procedure: GIVENS CAPSULE STUDY;  Surgeon: Carol Ada, MD;  Location: [place];  Service: Endoscopy;  Laterality: N/A;  . IR THORACENTESIS ASP PLEURAL SPACE W/IMG GUIDE  10/09/2018  . PACEMAKER INSERTION  Nov 2000   St Jude with revision in 2011  . PERCUTANEOUS NEPHROLITHOTOMY  April 2012  . RIGHT HEART CATH N/A 06/28/2018   Procedure: RIGHT HEART CATH;  Surgeon: Jolaine Artist, MD;  Location: [place] CV LAB;  Service: Cardiovascular;  Laterality: N/A;  . TEE WITHOUT CARDIOVERSION N/A 06/28/2018   Procedure: TRANSESOPHAGEAL ECHOCARDIOGRAM (TEE);  Surgeon: Jolaine Artist, MD;  [facility] END[facility];  Service: Cardiovascular;  Laterality: N/A;    Prior to Admission medications   Medication Sig Start Date End Date Taking? Authorizing Provider  allopurinol (ZYLOPRIM) 300 MG tablet Take 300 mg by mouth every evening.    Yes [provider]  aspirin EC 325 MG tablet Take 325 mg by mouth at bedtime.   Yes [provider]  calcitRIOL (ROCALTROL) 0.25 MCG capsule Take 0.25 mcg by mouth every other day.    Yes [provider]  clotrimazole-betamethasone (LOTRISONE) cream Apply 1 application topically 2 (two) times daily as needed (for irritation- affected sites).  07/26/18  Yes [provider]  COD LIVER OIL PO Take 1 capsule by mouth daily.    Yes [provider]  docusate sodium (COLACE) 100 MG capsule Take 100 mg by mouth daily as needed for mild constipation.    Yes [provider]  feeding supplement (BOOST HIGH PROTEIN) LIQD Take 1 Container by mouth 2 (two) times daily.    Yes [provider]  ferrous sulfate 325 (65 FE) MG tablet Take 325 mg by mouth at bedtime.    Yes [provider]  HYDROcodone-acetaminophen (NORCO) 10-325 MG per tablet Take 1 tablet by mouth See  admin instructions. Take 1 tablet by mouth two times a day and an additional 0.5 tablet two times a day as needed for pain   Yes [provider]  isosorbide mononitrate (IMDUR) 30 MG 24 hr tablet Take 0.5 tablets (15 mg total) by mouth daily. Patient taking differently: Take 15 mg by mouth at bedtime.  12/10/17  Yes Fay Records, MD  methocarbamol (ROBAXIN) 500 MG tablet Take 1 tablet (500 mg total) by mouth every 6 (six) hours as needed for muscle spasms. 10/18/18  Yes Shelly Coss, MD  metolazone (ZAROXOLYN) 2.5 MG tablet Take 2.5 mg by mouth every Monday, Wednesday, and Friday.    Yes [provider]  Multiple Vitamin (MULTIVITAMIN WITH MINERALS) TABS tablet Take  1 tablet by mouth at bedtime.   Yes [provider]  pantoprazole (PROTONIX) 40 MG tablet Take 1 tablet (40 mg total) by mouth 2 (two) times daily. 10/02/18  Yes Mahala Menghini, PA-C  polyethylene glycol (MIRALAX / GLYCOLAX) packet Take 17 g by mouth at bedtime.   Yes [provider]  potassium chloride SA (K-DUR,KLOR-CON) 20 MEQ tablet Start on 10/24/18 Patient taking differently: Take 20-40 mEq by mouth See admin instructions. Take 40 mEq by mouth in the morning and 20 mEq in the evening 10/18/18  Yes Adhikari, Tamsen Meek, MD  torsemide (DEMADEX) 20 MG tablet Take 4 tablets (80 mg total) by mouth 2 (two) times daily. May also take 1 tablet (20 mg total) as needed (for weight greater than 246). Patient taking differently: Take 80 mg by mouth two times a day and an additional 20 mg as needed for a weight gain >246 07/15/18  Yes Clegg, Amy D, NP  vitamin B-12 (CYANOCOBALAMIN) 500 MCG tablet Take 500 mcg by mouth daily.   Yes [provider]  potassium chloride SA (K-DUR,KLOR-CON) 20 MEQ tablet Take 2 tablets (40 mEq total) by mouth 2 (two) times daily for 5 days. Patient not taking: Reported on 11/05/2018 10/19/18 11/05/18  Shelly Coss, MD    Scheduled Meds: . pantoprazole (PROTONIX) IV  40 mg  Intravenous Q12H   Infusions: . sodium chloride     PRN Meds: HYDROcodone-acetaminophen, methocarbamol   Allergies as of 11/05/2018 - Review Complete 11/05/2018  Allergen Reaction Noted  . Beta adrenergic blockers Anaphylaxis and Other (See Comments)   . Cephalexin Shortness Of Breath 06/13/2016  . Codeine Anaphylaxis and Swelling   . Contrast media [iodinated diagnostic agents] Other (See Comments) 10/30/2014  . Coreg [carvedilol] Other (See Comments) 11/12/2014  . Peppermint flavor Shortness Of Breath 11/21/2016  . Prednisone Anaphylaxis, Shortness Of Breath, and Swelling   . Tape Other (See Comments) 03/06/2013  . Ciprofloxacin Hives   . Latex Itching and Rash   . Penicillins Hives   . Diamox [acetazolamide] Rash 07/15/2018    Family History  Problem Relation Age of Onset  . Heart attack Father   . Stroke Mother   . Diabetes Sister   . Colon cancer Other        seven family members  . Liver disease Neg Hx   . Other Neg Hx     Social History   Socioeconomic History  . Marital status: Widowed    Spouse name: Not on file  . Number of children: 1  . Years of education: Not on file  . Highest education level: Some college, no degree  Occupational History  . Not on file  Social Needs  . Financial resource strain: Not hard at all  . Food insecurity:    Worry: Never true    Inability: Never true  . Transportation needs:    Medical: No    Non-medical: No  Tobacco Use  . Smoking status: Former Smoker    Packs/day: 1.00    Types: Cigarettes    Last attempt to quit: 09/26/1979    Years since quitting: 39.1  . Smokeless tobacco: Never Used  Substance and Sexual Activity  . Alcohol use: No  . Drug use: No  . Sexual activity: Never  Lifestyle  . Physical activity:    Days per week: Not on file    Minutes per session: Not on file  . Stress: Not on file  Relationships  . Social connections:  Talks on phone: Not on file    Gets together: Not on file     Attends religious service: Not on file    Active member of club or organization: Not on file    Attends meetings of clubs or organizations: Not on file    Relationship status: Not on file  . Intimate partner violence:    Fear of current or ex partner: Not on file    Emotionally abused: Not on file    Physically abused: Not on file    Forced sexual activity: Not on file  Other Topics Concern  . Not on file  Social History Narrative  . Not on file    REVIEW OF SYSTEMS: Constitutional: Weakness. ENT:  No nose bleeds Pulm: Shortness of breath is chronic but was worse last week, improved after metolazone.  Nonproductive cough. CV:  No palpitations, no LE edema.  No chest pain GU:  No hematuria, no frequency GI:  Per HPI Heme: Reports no unusual or excessive bleeding or bruising. Transfusions:  See HPI MS: Because of advanced arthritis in her knees, hips and lower back, she is nonambulatory but is able to mobilize with her wheelchair and lives independently at home.   Neuro:  No headaches, no peripheral tingling or numbness Derm:  No itching, no rash or sores.  Endocrine:  No sweats or chills.  No polyuria or dysuria Immunization: Queried. Travel:  None other than traveling from her home in [place] to the hospital in [place].   PHYSICAL EXAM: Vital signs in last 24 hours: Vitals:   11/06/18 0542 11/06/18 0848  BP: (!) 92/58 (!) 108/58  Pulse: 96 99  Resp: 20 (!) 24  Temp: 98.3 F (36.8 C) 98.4 F (36.9 C)  SpO2: 99% 97%   Wt Readings from Last 3 Encounters:  11/06/18 111 kg  10/18/18 111.9 kg  10/02/18 115.7 kg    General: Chronically ill looking, morbidly obese WF.  Laying comfortably in bed. Head: No facial asymmetry or swelling.  No signs of head trauma. Eyes: No scleral icterus.  Conjunctiva is pale. Ears: Not hard of hearing Nose: No discharge Mouth: Moist, clear, pink oral mucosa.  Poor dentition.  Tongue midline. Neck: No JVD, no masses, no  thyromegaly. Lungs: Crackles in the right base.  Slight dyspnea with speaking.  No cough. Heart: Paced rhythm, regular.  0-3/5 systolic murmer.   Abdomen: Obese.  Soft.  Slight tenderness across the upper abdomen.  No masses, HSM, bruits.  Umbilical hernia.  Bowel sounds active..   Rectal: Not performed Musc/Skeltl: No both joint deformity. Extremities: Venous stasis dermatitis and swelling in both lower extremities. Neurologic: Alert.  Oriented x3.  No tremor, no gross deficits. Skin: Stasis dermatitis in her lower legs. Tattoos: None observed Nodes: No cervical adenopathy. Psych: Cooperative, pleasant, appropriate.  Intake/Output from previous day: 02/11 0701 - 02/12 0700 In: 200 [I.V.:100; IV Piggyback:100] Out: 500 [Urine:500] Intake/Output this shift: Total I/O In: 330 [Blood:330] Out: 350 [Urine:350]  LAB RESULTS: Recent Labs    11/05/18 1934  WBC 6.1  HGB 5.2*  HCT 16.8*  PLT 151   BMET Lab Results  Component Value Date   NA 132 (L) 11/05/2018   NA 135 10/18/2018   NA 136 10/17/2018   K 2.8 (L) 11/05/2018   K 2.4 (LL) 11/05/2018   K 3.1 (L) 10/18/2018   CL 84 (L) 11/05/2018   CL 94 (L) 10/18/2018   CL 97 (L) 10/17/2018   CO2 29 11/05/2018  CO2 29 10/18/2018   CO2 27 10/17/2018   GLUCOSE 169 (H) 11/05/2018   GLUCOSE 91 10/18/2018   GLUCOSE 86 10/17/2018   BUN 184 (H) 11/05/2018   BUN 72 (H) 10/18/2018   BUN 79 (H) 10/17/2018   CREATININE 3.13 (H) 11/05/2018   CREATININE 2.81 (H) 10/18/2018   CREATININE 2.92 (H) 10/17/2018   CALCIUM 9.3 11/05/2018   CALCIUM 9.2 10/18/2018   CALCIUM 8.9 10/17/2018   LFT Recent Labs    11/05/18 1934  PROT 5.8*  ALBUMIN 3.2*  AST 22  ALT 14  ALKPHOS 37*  BILITOT 0.8   PT/INR Lab Results  Component Value Date   INR 1.22 09/24/2018   INR 1.22 06/23/2018   INR 1.0 06/13/2016   Hepatitis Panel No results for input(s): HEPBSAG, HCVAB, HEPAIGM, HEPBIGM in the last 72 hours. C-Diff No components found for:  CDIFF Lipase  No results found for: LIPASE  Drugs of Abuse  No results found for: LABOPIA, COCAINSCRNUR, LABBENZ, AMPHETMU, THCU, LABBARB   RADIOLOGY STUDIES: Dg Chest 2 View  Result Date: 11/05/2018 CLINICAL DATA:  Weakness.  Dyspnea on exertion. EXAM: CHEST - 2 VIEW COMPARISON:  10/09/2018 FINDINGS: Left chest wall single lead pacemaker unchanged. Moderate cardiomegaly with medium-sized right pleural effusion, greater than on the previous exam. Associated atelectasis of the right lung base. The left lung is clear. IMPRESSION: Moderate cardiomegaly with intermediate sized right pleural effusion and associated atelectasis. Electronically Signed   By: Ulyses Jarred M.D.   On: 11/05/2018 22:39   Dg Abd 2 Views  Result Date: 11/05/2018 CLINICAL DATA:  Dyspnea on exertion. Anemia. EXAM: ABDOMEN - 2 VIEW COMPARISON:  None. FINDINGS: No dilated small bowel or free intraperitoneal air. There is multilevel lumbar spine degenerative change with mild levoscoliosis. No acute osseous abnormality. IMPRESSION: No radiographic evidence of small bowel obstruction. Electronically Signed   By: Ulyses Jarred M.D.   On: 11/05/2018 22:40     IMPRESSION:   *    Recurrent acute on chronic, transfusion requiring anemia.  FOBT + recurrent.   EGD 5 weeks ago showed erosive, hemorrhagic gastritis. Capsule endoscopy 3 weeks ago showed no source of bleeding. Colonoscopy last performed in 1998.  *    NASH cirrhosis.  No signs of gastric varices on recent EGD.  No hepatic masses on ultrasound in 08/2018.  *     OSA, pulmonary hypertension  *    CHF.  Status post pacemaker.   PLAN:     *   Colonoscopy?   Patient may need to get the okay from Dr. Haroldine Laws as she is leery of undergoing colonoscopy and CT as well as her renal and PMDs have advised her not to undergo colonoscopy in the past.  *    Does not need IV Protonix I stopped this and will start oral Protonix BID.   Discontinued n.p.o., allow clears for  now until colonoscopy situation sorted out.   Azucena Freed  11/06/2018, 11:31 AM Phone [phone]

## 2018-11-07 DIAGNOSIS — R079 Chest pain, unspecified: Secondary | ICD-10-CM

## 2018-11-07 DIAGNOSIS — I5042 Chronic combined systolic (congestive) and diastolic (congestive) heart failure: Secondary | ICD-10-CM

## 2018-11-07 DIAGNOSIS — D649 Anemia, unspecified: Secondary | ICD-10-CM

## 2018-11-07 LAB — HEMOGLOBIN AND HEMATOCRIT, BLOOD
HCT: 22.2 % — ABNORMAL LOW (ref 36.0–46.0)
HCT: 23.1 % — ABNORMAL LOW (ref 36.0–46.0)
Hemoglobin: 7 g/dL — ABNORMAL LOW (ref 12.0–15.0)
Hemoglobin: 7.2 g/dL — ABNORMAL LOW (ref 12.0–15.0)

## 2018-11-07 LAB — PREPARE RBC (CROSSMATCH)

## 2018-11-07 LAB — BASIC METABOLIC PANEL
Anion gap: 13 (ref 5–15)
BUN: 179 mg/dL — ABNORMAL HIGH (ref 8–23)
CO2: 29 mmol/L (ref 22–32)
Calcium: 8.8 mg/dL — ABNORMAL LOW (ref 8.9–10.3)
Chloride: 94 mmol/L — ABNORMAL LOW (ref 98–111)
Creatinine, Ser: 3.28 mg/dL — ABNORMAL HIGH (ref 0.44–1.00)
GFR calc Af Amer: 15 mL/min — ABNORMAL LOW (ref >60)
GFR calc non Af Amer: 13 mL/min — ABNORMAL LOW (ref >60)
Glucose, Bld: 139 mg/dL — ABNORMAL HIGH (ref 70–99)
Potassium: 3.5 mmol/L (ref 3.5–5.1)
Sodium: 136 mmol/L (ref 135–145)

## 2018-11-07 MED ORDER — SODIUM CHLORIDE 0.9% IV SOLUTION: Freq: Once | INTRAVENOUS | Status: DC

## 2018-11-07 MED ORDER — TORSEMIDE 20 MG PO TABS
80.0000 mg | ORAL_TABLET | Freq: Two times a day (BID) | ORAL | Status: DC
  Administered 2018-11-07 – 2018-11-09 (×4): 80 mg via ORAL
  Filled 2018-11-07 (×4): qty 4

## 2018-11-07 MED ORDER — CALCITRIOL 0.25 MCG PO CAPS
0.2500 ug | ORAL_CAPSULE | ORAL | Status: DC
  Administered 2018-11-08 – 2018-11-18 (×6): 0.25 ug via ORAL
  Filled 2018-11-07 (×7): qty 1

## 2018-11-07 MED ORDER — CYANOCOBALAMIN 500 MCG PO TABS
500.0000 ug | ORAL_TABLET | Freq: Every day | ORAL | Status: DC
  Administered 2018-11-08 – 2018-11-18 (×10): 500 ug via ORAL
  Filled 2018-11-07 (×11): qty 1

## 2018-11-07 MED ORDER — DOCUSATE SODIUM 100 MG PO CAPS
100.0000 mg | ORAL_CAPSULE | Freq: Every day | ORAL | Status: DC | PRN
  Administered 2018-11-09: 100 mg via ORAL
  Filled 2018-11-07: qty 1

## 2018-11-07 MED ORDER — ALLOPURINOL 300 MG PO TABS
300.0000 mg | ORAL_TABLET | Freq: Every evening | ORAL | Status: DC
  Administered 2018-11-07 – 2018-11-17 (×11): 300 mg via ORAL
  Filled 2018-11-07 (×11): qty 1

## 2018-11-07 MED ORDER — ISOSORBIDE MONONITRATE ER 30 MG PO TB24
15.0000 mg | ORAL_TABLET | Freq: Every day | ORAL | Status: DC
  Administered 2018-11-07 – 2018-11-12 (×6): 15 mg via ORAL
  Administered 2018-11-13: 0.5 mg via ORAL
  Administered 2018-11-14 – 2018-11-17 (×3): 15 mg via ORAL
  Filled 2018-11-07 (×11): qty 1

## 2018-11-07 MED ORDER — FUROSEMIDE 10 MG/ML IJ SOLN
120.0000 mg | Freq: Once | INTRAVENOUS | Status: AC
  Administered 2018-11-07: 120 mg via INTRAVENOUS
  Filled 2018-11-07: qty 10

## 2018-11-07 MED ORDER — ADULT MULTIVITAMIN W/MINERALS CH
1.0000 | ORAL_TABLET | Freq: Every day | ORAL | Status: DC
  Administered 2018-11-07 – 2018-11-17 (×11): 1 via ORAL
  Filled 2018-11-07 (×11): qty 1

## 2018-11-07 MED ORDER — SODIUM CHLORIDE 0.9 % IV SOLN
125.0000 mg | Freq: Every day | INTRAVENOUS | Status: AC
  Administered 2018-11-07 – 2018-11-10 (×4): 125 mg via INTRAVENOUS
  Filled 2018-11-07 (×4): qty 10

## 2018-11-07 NOTE — Progress Notes (Signed)
Pt. Has home cpap, places on herself when ready.

## 2018-11-07 NOTE — Consult Note (Signed)
[facility] Renal Consultation Note  Requesting MD: Dr. Antonieta Pert Indication for Consultation:  CKD   HPI: [name] [name] is a 73 y.o. female with HTN, obesity, combined HF, A fib, MV disease, OSA, cirrhosis secondary to NASH who is seen for CKD.  She follows with Dr. Jimmy Footman in clinic.   She was admitted 2 nights ago for anemia with Hb 5.2.  She is undergoing GI evaluation.  This has occurred numerous times and she has had eval by Gi in prior admissions detailed in their note.  She's been transfused and is awaiting further eval including discussion re: risk/benefit of colonoscopy.    She saw Dr. Jimmy Footman in clinic just prior to this admission and iron sat was noted to be < 10.  It's been requested that she receive IV iron while admitted.    Cr recently in the 2.7-3 range generally over the past few months. She frequently develops volume overload that responds to doses of metolazone to augment her loop diuretic. She feels she has moderate swelling now.   Creat  Date/Time Value Ref Range Status  04/13/2014 02:05 PM 1.36 (H) 0.50 - 1.10 mg/dL Final  06/30/2013 01:06 PM 1.43 (H) 0.50 - 1.10 mg/dL Final  06/16/2013 02:03 PM 1.39 (H) 0.50 - 1.10 mg/dL Final  05/30/2013 01:36 PM 1.69 (H) 0.50 - 1.10 mg/dL Final   Creatinine, Ser  Date/Time Value Ref Range Status  11/07/2018 04:20 AM 3.28 (H) 0.44 - 1.00 mg/dL Final  11/06/2018 11:12 AM 3.32 (H) 0.44 - 1.00 mg/dL Final  11/05/2018 07:34 PM 3.13 (H) 0.44 - 1.00 mg/dL Final  10/18/2018 05:26 AM 2.81 (H) 0.44 - 1.00 mg/dL Final  10/17/2018 05:21 AM 2.92 (H) 0.44 - 1.00 mg/dL Final  10/16/2018 05:03 AM 3.24 (H) 0.44 - 1.00 mg/dL Final  10/15/2018 05:23 AM 3.40 (H) 0.44 - 1.00 mg/dL Final  10/14/2018 05:39 AM 3.20 (H) 0.44 - 1.00 mg/dL Final  10/13/2018 04:57 AM 2.99 (H) 0.44 - 1.00 mg/dL Final  10/12/2018 05:41 AM 2.74 (H) 0.44 - 1.00 mg/dL Final  10/11/2018 04:37 AM 2.89 (H) 0.44 - 1.00 mg/dL Final  10/10/2018 06:10 AM  2.99 (H) 0.44 - 1.00 mg/dL Final  10/09/2018 05:14 AM 2.99 (H) 0.44 - 1.00 mg/dL Final  10/08/2018 04:25 AM 3.03 (H) 0.44 - 1.00 mg/dL Final  10/07/2018 01:37 PM 3.06 (H) 0.44 - 1.00 mg/dL Final  10/07/2018 05:02 AM 2.94 (H) 0.44 - 1.00 mg/dL Final  10/06/2018 01:09 PM 3.00 (H) 0.44 - 1.00 mg/dL Final  10/03/2018 09:38 AM 2.90 (H) 0.57 - 1.00 mg/dL Final  09/29/2018 10:39 AM 2.65 (H) 0.44 - 1.00 mg/dL Final  09/28/2018 07:24 PM 2.77 (H) 0.44 - 1.00 mg/dL Final  09/27/2018 09:38 AM 2.75 (H) 0.44 - 1.00 mg/dL Final  09/27/2018 03:42 AM 2.69 (H) 0.44 - 1.00 mg/dL Final  09/26/2018 11:49 AM 3.06 (H) 0.44 - 1.00 mg/dL Final  09/26/2018 05:54 AM 3.18 (H) 0.44 - 1.00 mg/dL Final  09/25/2018 11:23 AM 3.43 (H) 0.44 - 1.00 mg/dL Final  09/24/2018 05:01 PM 4.00 (H) 0.44 - 1.00 mg/dL Final  09/24/2018 10:52 AM 4.16 (H) 0.44 - 1.00 mg/dL Final  09/24/2018 03:16 AM 4.27 (H) 0.44 - 1.00 mg/dL Final  09/24/2018 12:21 AM 4.32 (H) 0.44 - 1.00 mg/dL Final  09/23/2018 07:44 PM 4.40 (H) 0.44 - 1.00 mg/dL Final  09/23/2018 07:30 PM 4.32 (H) 0.44 - 1.00 mg/dL Final  09/20/2018 10:17 AM 3.17 (H) 0.57 - 1.00 mg/dL Final  09/16/2018 01:16  PM 3.13 (H) 0.44 - 1.00 mg/dL Final  07/02/2018 01:49 AM 2.54 (H) 0.44 - 1.00 mg/dL Final  07/01/2018 02:32 AM 2.40 (H) 0.44 - 1.00 mg/dL Final  06/30/2018 08:40 AM 2.37 (H) 0.44 - 1.00 mg/dL Final  06/29/2018 02:12 AM 2.48 (H) 0.44 - 1.00 mg/dL Final  06/28/2018 05:34 AM 2.46 (H) 0.44 - 1.00 mg/dL Final  06/27/2018 03:53 AM 2.46 (H) 0.44 - 1.00 mg/dL Final  06/26/2018 03:39 AM 2.57 (H) 0.44 - 1.00 mg/dL Final  06/25/2018 02:57 AM 2.36 (H) 0.44 - 1.00 mg/dL Final  06/24/2018 03:31 AM 2.30 (H) 0.44 - 1.00 mg/dL Final  06/23/2018 02:47 AM 2.48 (H) 0.44 - 1.00 mg/dL Final  06/22/2018 12:59 AM 2.56 (H) 0.44 - 1.00 mg/dL Final  06/21/2018 03:52 PM 2.44 (H) 0.44 - 1.00 mg/dL Final  06/21/2018 08:21 AM 2.70 (H) 0.44 - 1.00 mg/dL Final  06/20/2018 10:49 PM 2.72 (H) 0.44 - 1.00  mg/dL Final  05/25/2017 12:56 PM 2.64 (H) 0.57 - 1.00 mg/dL Final  05/17/2017 09:00 AM 2.16 (H) 0.57 - 1.00 mg/dL Final  05/10/2017 08:45 AM 2.39 (H) 0.57 - 1.00 mg/dL Final  05/04/2017 01:42 PM 1.91 (H) 0.57 - 1.00 mg/dL Final  04/07/2017 04:40 AM 1.96 (H) 0.44 - 1.00 mg/dL Final     PMHx:   Past Medical History:  Diagnosis Date  . Anemia   . Atrial fibrillation ([place])   . Cataract   . Cervical cancer ([place]) 1991   s/p hysterectomy  . Chronic cellulitis   . Chronic combined systolic and diastolic congestive heart failure, NYHA class 2 (HCC)    LVEF 25-30% with restrictive diastolic filling  . Degenerative joint disease   . Diverticulitis   . Essential hypertension   . GERD (gastroesophageal reflux disease)   . Gout   . Kidney stones   . Morbid obesity ([place])   . NICM (nonischemic cardiomyopathy) ([place]) 02/22/2017  . Osteoarthritis   . Permanent atrial fibrillation 05/04/2009   Qualifier: History of  By: Quentin Cornwall CMA, Janett Billow    . 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 Sinoatrial node dysfunction [facility])    Status post PPM - Dr. Lovena Le  . Sleep apnea    CPAP    Past Surgical History:  Procedure Laterality Date  . ABDOMINAL HYSTERECTOMY  1991  . BIOPSY  09/26/2018   Procedure: BIOPSY;  Surgeon: Lavena Bullion, DO;  Location: [facility];  Service: Gastroenterology;;  . COLONOSCOPY  1998   one polyp per patient  . ESOPHAGOGASTRODUODENOSCOPY (EGD) WITH PROPOFOL N/A 09/26/2018   Procedure: ESOPHAGOGASTRODUODENOSCOPY (EGD) WITH PROPOFOL;  Surgeon: Lavena Bullion, DO;  Location: [place];  Service: Gastroenterology;  Laterality: N/A;  . EYE SURGERY    . GIVENS CAPSULE STUDY N/A 10/09/2018   Procedure: GIVENS CAPSULE STUDY;  Surgeon: Carol Ada, MD;  Location: [place];  Service: Endoscopy;  Laterality: N/A;  . IR THORACENTESIS ASP PLEURAL SPACE W/IMG GUIDE  10/09/2018  . PACEMAKER INSERTION   Nov 2000   St Jude with revision in 2011  . PERCUTANEOUS NEPHROLITHOTOMY  April 2012  . RIGHT HEART CATH N/A 06/28/2018   Procedure: RIGHT HEART CATH;  Surgeon: Jolaine Artist, MD;  Location: [facility];  Service: Cardiovascular;  Laterality: N/A;  . TEE WITHOUT CARDIOVERSION N/A 06/28/2018   Procedure: TRANSESOPHAGEAL ECHOCARDIOGRAM (TEE);  Surgeon: Jolaine Artist, MD;  Location: [place];  Service: Cardiovascular;  Laterality:  N/A;    Family Hx:  Family History  Problem Relation Age of Onset  . Heart attack Father   . Stroke Mother   . Diabetes Sister   . Colon cancer Other        seven family members  . Liver disease Neg Hx   . Other Neg Hx     Social History:  reports that she quit smoking about 39 years ago. Her smoking use included cigarettes. She smoked 1.00 pack per day. She has never used smokeless tobacco. She reports that she does not drink alcohol or use drugs.  Allergies:  Allergies  Allergen Reactions  . Beta Adrenergic Blockers Anaphylaxis and Other (See Comments)    "DEATH" (Bradycardia and her organs shut down)  . Cephalexin Shortness Of Breath  . Codeine Anaphylaxis and Swelling    Throat swelling  . Contrast Media [Iodinated Diagnostic Agents] Other (See Comments)    Patient states "I have chronic kidney disease, so the doctor said no dye in my veins."  . Coreg [Carvedilol] Other (See Comments)    Beta Blockers cause her organs to shut down (per patient)  . Peppermint Flavor Shortness Of Breath  . Prednisone Anaphylaxis, Shortness Of Breath and Swelling    Throat swells   . Tape Other (See Comments)    States plastic tape blisters her skin  . Ciprofloxacin Hives    Tolerating levofloxin   . Latex Itching and Rash  . Penicillins Hives    DID THE REACTION INVOLVE: Swelling of the face/tongue/throat, SOB, or low BP? Yes Sudden or severe rash/hives, skin peeling, or the inside of the mouth or nose? Yes Did it require medical treatment?  Pt was in hospital at the time of reaction When did it last happen?Was in her mid-40's If all above answers are "NO", may proceed with cephalosporin use.  . Diamox [Acetazolamide] Rash    Rash after 2 doses of diamox     Medications: Prior to Admission medications   Medication Sig Start Date End Date Taking? Authorizing Provider  allopurinol (ZYLOPRIM) 300 MG tablet Take 300 mg by mouth every evening.    Yes [provider]  aspirin EC 325 MG tablet Take 325 mg by mouth at bedtime.   Yes [provider]  calcitRIOL (ROCALTROL) 0.25 MCG capsule Take 0.25 mcg by mouth every other day.    Yes [provider]  clotrimazole-betamethasone (LOTRISONE) cream Apply 1 application topically 2 (two) times daily as needed (for irritation- affected sites).  07/26/18  Yes [provider]  COD LIVER OIL PO Take 1 capsule by mouth daily.    Yes [provider]  docusate sodium (COLACE) 100 MG capsule Take 100 mg by mouth daily as needed for mild constipation.    Yes [provider]  feeding supplement (BOOST HIGH PROTEIN) LIQD Take 1 Container by mouth 2 (two) times daily.    Yes [provider]  ferrous sulfate 325 (65 FE) MG tablet Take 325 mg by mouth at bedtime.    Yes [provider]  HYDROcodone-acetaminophen (NORCO) 10-325 MG per tablet Take 1 tablet by mouth See admin instructions. Take 1 tablet by mouth two times a day and an additional 0.5 tablet two times a day as needed for pain   Yes [provider]  isosorbide mononitrate (IMDUR) 30 MG 24 hr tablet Take 0.5 tablets (15 mg total) by mouth daily. Patient taking differently: Take 15 mg by mouth at bedtime.  12/10/17  Yes Harrington Challenger,  Carmin Muskrat, MD  methocarbamol (ROBAXIN) 500 MG tablet Take 1 tablet (500 mg total) by mouth every 6 (six) hours as needed for muscle spasms. 10/18/18  Yes Shelly Coss, MD  metolazone (ZAROXOLYN) 2.5 MG tablet Take 2.5 mg by mouth every Monday,  Wednesday, and Friday.    Yes [provider]  Multiple Vitamin (MULTIVITAMIN WITH MINERALS) TABS tablet Take 1 tablet by mouth at bedtime.   Yes [provider]  pantoprazole (PROTONIX) 40 MG tablet Take 1 tablet (40 mg total) by mouth 2 (two) times daily. 10/02/18  Yes Mahala Menghini, PA-C  polyethylene glycol (MIRALAX / GLYCOLAX) packet Take 17 g by mouth at bedtime.   Yes [provider]  potassium chloride SA (K-DUR,KLOR-CON) 20 MEQ tablet Start on 10/24/18 Patient taking differently: Take 20-40 mEq by mouth See admin instructions. Take 40 mEq by mouth in the morning and 20 mEq in the evening 10/18/18  Yes Adhikari, Tamsen Meek, MD  torsemide (DEMADEX) 20 MG tablet Take 4 tablets (80 mg total) by mouth 2 (two) times daily. May also take 1 tablet (20 mg total) as needed (for weight greater than 246). Patient taking differently: Take 80 mg by mouth two times a day and an additional 20 mg as needed for a weight gain >246 07/15/18  Yes Clegg, Amy D, NP  vitamin B-12 (CYANOCOBALAMIN) 500 MCG tablet Take 500 mcg by mouth daily.   Yes [provider]  potassium chloride SA (K-DUR,KLOR-CON) 20 MEQ tablet Take 2 tablets (40 mEq total) by mouth 2 (two) times daily for 5 days. Patient not taking: Reported on 11/05/2018 10/19/18 11/05/18  Shelly Coss, MD    I have reviewed the patient's current medications.  Labs:  Results for orders placed or performed during the hospital encounter of 11/05/18 (from the past 48 hour(s))  POC occult blood, ED     Status: Abnormal   Collection Time: 11/05/18  7:11 PM  Result Value Ref Range   Fecal Occult Bld POSITIVE (A) NEGATIVE  Comprehensive metabolic panel     Status: Abnormal   Collection Time: 11/05/18  7:34 PM  Result Value Ref Range   Sodium 132 (L) 135 - 145 mmol/L   Potassium 2.4 (LL) 3.5 - 5.1 mmol/L    Comment: CRITICAL RESULT CALLED TO, READ BACK BY AND VERIFIED WITH: S BLACKWELL,RN 2030 11/05/2018 WBOND    Chloride 84  (L) 98 - 111 mmol/L   CO2 29 22 - 32 mmol/L   Glucose, Bld 169 (H) 70 - 99 mg/dL   BUN 184 (H) 8 - 23 mg/dL   Creatinine, Ser 3.13 (H) 0.44 - 1.00 mg/dL   Calcium 9.3 8.9 - 10.3 mg/dL   Total Protein 5.8 (L) 6.5 - 8.1 g/dL   Albumin 3.2 (L) 3.5 - 5.0 g/dL   AST 22 15 - 41 U/L   ALT 14 0 - 44 U/L   Alkaline Phosphatase 37 (L) 38 - 126 U/L   Total Bilirubin 0.8 0.3 - 1.2 mg/dL   GFR calc non Af Amer 14 (L) >60 mL/min   GFR calc Af Amer 16 (L) >60 mL/min   Anion gap 19 (H) 5 - 15    Comment: Performed at [facility], [address], [place] [address]  CBC     Status: Abnormal   Collection Time: 11/05/18  7:34 PM  Result Value Ref Range   WBC 6.1 4.0 - 10.5 K/uL   RBC 1.68 (L) 3.87 - 5.11 MIL/uL  Hemoglobin 5.2 (LL) 12.0 - 15.0 g/dL    Comment: This critical result has verified and been called to [facility] by Gerrie Nordmann on 02 11 2020 at 2006, and has been read back. READ BACK AND VERIFIED REPEATED TO VERIFY CORRECTED ON 02/11 AT 2116: PREVIOUSLY REPORTED AS 5.2 This critical result has verified and been called to S BLACKWELL,RN by Gerrie Nordmann on 02 11 2020 at 2006, and has been read back. READ BACK AND VERIFIED    HCT 16.8 (L) 36.0 - 46.0 %   MCV 100.0 80.0 - 100.0 fL   MCH 31.0 26.0 - 34.0 pg   MCHC 31.0 30.0 - 36.0 g/dL   RDW 21.1 (H) 11.5 - 15.5 %   Platelets 151 150 - 400 K/uL   nRBC 0.0 0.0 - 0.2 %    Comment: Performed at [facility] [address] [place], [place] [address]  Type and screen [place]     Status: None   Collection Time: 11/05/18  7:34 PM  Result Value Ref Range   ABO/RH(D) O POS    Antibody Screen POS    Sample Expiration 11/08/2018    Antibody Identification ANTI e ANTI JKA (Kidd a)    Blood Bank Correction      PREVIOUSLY REPORTED AS: ANTI JKB RESULT MODIFIED ON: 11/06/18 2230 CORRECTED BY M ESS, CALLED TO AMBER, RN   Unit Number [id number]    Blood Component Type RED CELLS,LR    Unit division  00    Status of Unit ISSUED,FINAL    Donor AG Type      NEGATIVE FOR KIDD A ANTIGEN NEGATIVE FOR e ANTIGEN NEGATIVE FOR C ANTIGEN   Transfusion Status OK TO TRANSFUSE    Crossmatch Result COMPATIBLE    Unit Number [id number]    Blood Component Type RED CELLS,LR    Unit division 00    Status of Unit ISSUED,FINAL    Donor AG Type      NEGATIVE FOR C ANTIGEN NEGATIVE FOR e ANTIGEN NEGATIVE FOR KIDD A ANTIGEN   Transfusion Status OK TO TRANSFUSE    Crossmatch Result COMPATIBLE   Prepare RBC     Status: None   Collection Time: 11/05/18  7:34 PM  Result Value Ref Range   Order Confirmation      ORDER PROCESSED BY BLOOD BANK Performed at [facility], [place] [address], [place], [place] [address]   I-Stat Troponin, ED (not at [facility])     Status: None   Collection Time: 11/05/18  7:45 PM  Result Value Ref Range   Troponin i, poc 0.05 0.00 - 0.08 ng/mL   Comment 3            Comment: Due to the release kinetics of cTnI, a negative result within the first hours of the onset of symptoms does not rule out myocardial infarction with certainty. If myocardial infarction is still suspected, repeat the test at appropriate intervals.   Troponin I - Now Then Q6H     Status: Abnormal   Collection Time: 11/05/18 10:24 PM  Result Value Ref Range   Troponin I 0.05 (HH) <0.03 ng/mL    Comment: CRITICAL RESULT CALLED TO, READ BACK BY AND VERIFIED WITH: BLACKWELL,S RN 11/05/2018 2319 JORDANS Performed at [facility], [place] [address], [place], [place] [address]   Magnesium     Status: Abnormal   Collection Time: 11/05/18 10:24 PM  Result Value Ref Range  Magnesium 3.0 (H) 1.7 - 2.4 mg/dL    Comment: Performed at [facility], [place] [address] [place], [place] [address]  Brain natriuretic peptide     Status: Abnormal   Collection Time: 11/05/18 10:24 PM  Result Value Ref Range   B Natriuretic Peptide 228.9 (H) 0.0 - 100.0 pg/mL    Comment: Performed at [facility] [address], [place], [place] [address]  Potassium     Status: Abnormal   Collection Time: 11/05/18 11:43 PM  Result Value Ref Range   Potassium 2.8 (L) 3.5 - 5.1 mmol/L    Comment: Performed at [facility] [address], [place] [address]  CBC     Status: Abnormal   Collection Time: 11/06/18 11:12 AM  Result Value Ref Range   WBC 7.4 4.0 - 10.5 K/uL   RBC 2.01 (L) 3.87 - 5.11 MIL/uL   Hemoglobin 6.2 (LL) 12.0 - 15.0 g/dL    Comment: REPEATED TO VERIFY POST TRANSFUSION SPECIMEN CRITICAL VALUE NOTED.  VALUE IS CONSISTENT WITH PREVIOUSLY REPORTED AND CALLED VALUE.    HCT 19.7 (L) 36.0 - 46.0 %   MCV 98.0 80.0 - 100.0 fL   MCH 30.8 26.0 - 34.0 pg   MCHC 31.5 30.0 - 36.0 g/dL   RDW 20.2 (H) 11.5 - 15.5 %   Platelets 159 150 - 400 K/uL   nRBC 0.0 0.0 - 0.2 %    Comment: Performed at [facility] [address], [place], [place] [address]  Basic metabolic panel     Status: Abnormal   Collection Time: 11/06/18 11:12 AM  Result Value Ref Range   Sodium 135 135 - 145 mmol/L   Potassium 3.7 3.5 - 5.1 mmol/L    Comment: NO VISIBLE HEMOLYSIS   Chloride 89 (L) 98 - 111 mmol/L   CO2 27 22 - 32 mmol/L   Glucose, Bld 145 (H) 70 - 99 mg/dL   BUN 188 (H) 8 - 23 mg/dL   Creatinine, Ser 3.32 (H) 0.44 - 1.00 mg/dL   Calcium 9.0 8.9 - 10.3 mg/dL   GFR calc non Af Amer 13 (L) >60 mL/min   GFR calc Af Amer 15 (L) >60 mL/min   Anion gap 19 (H) 5 - 15    Comment: Performed at [facility], [place] [address], [place], [place] [address]  Troponin I - Now Then Q6H     Status: Abnormal   Collection Time: 11/06/18 11:12 AM  Result Value Ref Range   Troponin I 0.06 (HH) <0.03 ng/mL    Comment: CRITICAL VALUE NOTED.  VALUE IS CONSISTENT WITH PREVIOUSLY REPORTED AND CALLED VALUE. Performed at [facility], [place] [address], [place], [place] [address]   Hemoglobin and hematocrit, blood     Status: Abnormal   Collection Time: 11/07/18  4:20 AM  Result Value Ref Range    Hemoglobin 7.0 (L) 12.0 - 15.0 g/dL   HCT 22.2 (L) 36.0 - 46.0 %    Comment: Performed at [facility], [place] [address], [place], [place] [address]  Basic metabolic panel     Status: Abnormal   Collection Time: 11/07/18  4:20 AM  Result Value Ref Range   Sodium 136 135 - 145 mmol/L   Potassium 3.5 3.5 - 5.1 mmol/L   Chloride 94 (L) 98 - 111 mmol/L   CO2 29 22 - 32 mmol/L   Glucose, Bld 139 (H) 70 -  99 mg/dL   BUN 179 (H) 8 - 23 mg/dL   Creatinine, Ser 3.28 (H) 0.44 - 1.00 mg/dL   Calcium 8.8 (L) 8.9 - 10.3 mg/dL   GFR calc non Af Amer 13 (L) >60 mL/min   GFR calc Af Amer 15 (L) >60 mL/min   Anion gap 13 5 - 15    Comment: Performed at [facility] [address] [place], [place] [address]  Hemoglobin and hematocrit, blood     Status: Abnormal   Collection Time: 11/07/18 10:18 AM  Result Value Ref Range   Hemoglobin 7.2 (L) 12.0 - 15.0 g/dL   HCT 23.1 (L) 36.0 - 46.0 %    Comment: Performed at [facility] [address] [place], [place] [address]     ROS:  Pertinent items are noted in HPI.  Physical Exam: Vitals:   11/07/18 0448 11/07/18 0815  BP: (!) 113/58 105/62  Pulse: 97 95  Resp: 18 18  Temp: 97.6 F (36.4 C) 98.1 F (36.7 C)  SpO2: 97% 98%     General: morbidly obese in bedside chair uncomfortable with simple movements HEENT: MM tacky Eyes: anicteric Neck: supple Heart: RRR, syst murmur throughout III/VI Lungs: normal WOB, no rales or wheezes Abdomen: soft, obese Extremities: 1+ pitting edema to just below knees, L > R, chronic stasis changes noted Skin: ruddy discoloration of LE with lichenification  Neuro: grossly nonfocal  Assessment/Plan:  1.  Anemia:  Presumed GI source.  Transfused and responded.  Discussion re: colonoscopy.   Iron deficient - IV iron ordered, the blood transfusions will help too.   2.  CKD 4:  Creatinine within recent baseline currently.  Watch closely.   3.  H/o MR, TR, CHF: cardiology to consult while  admitted.  She is near her baseline volume status.   4.  Atrial fibrillation with AV node ablated and PPM:  No anticoag with bleeding.    Justin Mend 11/07/2018, 1:00 PM

## 2018-11-07 NOTE — Progress Notes (Signed)
PROGRESS NOTE    [name] [name]  HRC:[id number] DOB: 04-May-1946 DOA: 11/05/2018 PCP: [name], MD   Brief Narrative: HPI as per [name]: 73 y.o. 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  Patient reports having fatigue and dyspnea for the past 2 days.  Denies having any hematemesis, hematochezia, or melena.  Denies having any abdominal pain.  States her gastroenterologist is Dr. Chartered loss adjuster in [place].  Reports having history of angina but states she has been having intermittent substernal chest pressure at rest lasting 30 to 40 seconds for the past few days.  Daughter at bedside states patient takes torsemide and metolazone for her lower extremity edema.  Subjective: Patient resting comfortably.  Denies any chest pain nausea vomiting shortness of breath.  No bowel movement yet last bowel movement was yesterday 4 pm  And reports it was normal. Complains of diffuse abdominal discomfort.  Finished 1 unit of PRBC, awaiting for second unit as patient had difficult with obtaining compatible blood. CBC/BMP is being drawn at noon today.  Patient was 2.8 and was repleted overnight.  Last hemoglobin 5.2 g prior to blood transfusion. Creat was 3.13- new pending. Patient wanting to sit on the bedside chair, I requested her to be on complete bedrest until she gets 2 units of blood.  Assessment & Plan:   Symptomatic anemia: Suspecting in the setting of anemia of chronic disease/renal disease status pos 2 unit  transfusion hemoglobin appropriately increased t o7.1->7.2 gm. Patient denies any rectal bleeding/hematemesis.  She is hemodynamically stable.  He had recent extensive work-up  including EGD and capsule endoscopy.  Reports her bowel movement was regular: But was heme positive in the ER.  Seen by GI and patient has refused colonoscopy at this time and GI has signed off.  Getting IV iron for her iron deficiency. Continue p.o. Protonix twice daily as per GI. nephro recommending additional transfusion. I discussed w patient, risk/benefits/alternatives- she is agreeable w 1 unit prbc and ordered it. Resume torsemide home dose. If patient become symptomatic with shortness of breath we can try Lasix IV here.  Chronic combined systolic and diastolic CHF: Compensated.  She is on room air currently.  It is a chronic appearing leg edema with chronic hyperpigmentation.  Her EF was 30% on January 2020. No SOB. Diuretics on hold due to AKI.  Chest pain, likely in the setting of severe anemia.  Troponin is at 0.05- 0.06 and flat.  EKG was paced rhythm.  Per request consulted cardiology.  Patient denies chest pain.  CKD stage IV, baseline creatinine 2.8- creatinine seem abt the baseline in low 3s.Patient is known to [facility] and being seen. Resume torsemide.  Hypokalemia: Was repleted aggressively and level improved.  PAF chads2vas scoreecmd:o pacemaker in place.  She is not on anticoagulation or rate controlling agent.  OSA on CPAP nightly  Hx of SA node dysfunction with pacemaker in place.  DVT prophylaxis: SCD Code Status: full Family Communication:I have updated pt's daughter over the phone. Disposition Plan: Remains inpatient pending clinical improvement.  Possible discharge in next 24 hours if hemoglobin remains stable. getting IV iron therapy.  Consultants:  Gastroenterology consulted. Cardiology Nephrology  Procedures:  Antimicrobials: Anti-infectives (From admission, onward)   None  Objective: Vitals:   11/06/18 2233 11/07/18 0053 11/07/18 0448 11/07/18 0815  BP: (!) 111/48 103/61 (!) 113/58 105/62  Pulse: 97 90 97 95  Resp: 18 17 18 18   Temp:  98.3 F (36.8 C)  97.6 F (36.4 C) 98.1 F (36.7 C)  TempSrc: Oral   Oral  SpO2: 97% 97% 97% 98%  Weight:      Height:        Intake/Output Summary (Last 24 hours) at 11/07/2018 1416 Last data filed at 11/07/2018 1100 Gross per 24 hour  Intake 905 ml  Output 1540 ml  Net -635 ml   Filed Weights   11/06/18 0441  Weight: 111 kg   Weight change:   Body mass index is 39.5 kg/m.  Intake/Output from previous day: 02/12 0701 - 02/13 0700 In: 1035 [P.O.:340; I.V.:50; Blood:645] Out: 1450 [Urine:1450] Intake/Output this shift: Total I/O In: 200 [P.O.:200] Out: 440 [Urine:440]  Examination: General exam: Calm, comfortable, not in acute distress, older for age, average built.  HEENT:Oral mucosa moist, Ear/Nose WNL grossly, dentition normal. Respiratory system: Bilateral equal air entry, no crackles and wheezing, no use of accessory muscle, non tender on palpation. Cardiovascular system: regular rate and rhythm, S1 & S2 heard, No JVD/murmurs. Gastrointestinal system: Abdomen soft, non-tender, non-distended, BS +. No hepatosplenomegaly palpable. Nervous System:Alert, awake and oriented at baseline. Able to move UE and LE, sensation intact. Extremities: Bilateral lower leg edema with chronic hyperpigmentation, chronic appearing edema. distal peripheral pulses palpable.  Skin: No rashes,no icterus. MSK: Normal muscle bulk,tone, power  Medications:  Scheduled Meds: . pantoprazole  40 mg Oral BID   Continuous Infusions: . sodium chloride    . ferric gluconate (FERRLECIT/NULECIT) IV      Data Reviewed: I have personally reviewed following labs and imaging studies  CBC: Recent Labs  Lab 11/05/18 1934 11/06/18 1112 11/07/18 0420 11/07/18 1018  WBC 6.1 7.4  --   --   HGB 5.2* 6.2* 7.0* 7.2*  HCT 16.8* 19.7* 22.2* 23.1*  MCV 100.0 98.0  --   --   PLT 151 159  --   --    Basic Metabolic Panel: Recent Labs  Lab 11/05/18 1934 11/05/18 2224 11/05/18 2343 11/06/18 1112  11/07/18 0420  NA 132*  --   --  135 136  K 2.4*  --  2.8* 3.7 3.5  CL 84*  --   --  89* 94*  CO2 29  --   --  27 29  GLUCOSE 169*  --   --  145* 139*  BUN 184*  --   --  188* 179*  CREATININE 3.13*  --   --  3.32* 3.28*  CALCIUM 9.3  --   --  9.0 8.8*  MG  --  3.0*  --   --   --    GFR: Estimated Creatinine Clearance: 19.3 mL/min (A) (by C-G formula based on SCr of 3.28 mg/dL (H)). Liver Function Tests: Recent Labs  Lab 11/05/18 1934  AST 22  ALT 14  ALKPHOS 37*  BILITOT 0.8  PROT 5.8*  ALBUMIN 3.2*   No results for input(s): LIPASE, AMYLASE in the last 168 hours. No results for input(s): AMMONIA in the last 168 hours. Coagulation Profile: No results for input(s): INR, PROTIME in the last 168 hours. Cardiac Enzymes: Recent Labs  Lab 11/05/18 2224 11/06/18 1112  TROPONINI 0.05* 0.06*   BNP (last 3 results) No results for input(s): PROBNP in the last 8760 hours. HbA1C: No results for  input(s): HGBA1C in the last 72 hours. CBG: No results for input(s): GLUCAP in the last 168 hours. Lipid Profile: No results for input(s): CHOL, HDL, LDLCALC, TRIG, CHOLHDL, LDLDIRECT in the last 72 hours. Thyroid Function Tests: No results for input(s): TSH, T4TOTAL, FREET4, T3FREE, THYROIDAB in the last 72 hours. Anemia Panel: No results for input(s): VITAMINB12, FOLATE, FERRITIN, TIBC, IRON, RETICCTPCT in the last 72 hours. Sepsis Labs: No results for input(s): PROCALCITON, LATICACIDVEN in the last 168 hours.  No results found for this or any previous visit (from the past 240 hour(s)).    Radiology Studies: Dg Chest 2 View  Result Date: 11/05/2018 CLINICAL DATA:  Weakness.  Dyspnea on exertion. EXAM: CHEST - 2 VIEW COMPARISON:  10/09/2018 FINDINGS: Left chest wall single lead pacemaker unchanged. Moderate cardiomegaly with medium-sized right pleural effusion, greater than on the previous exam. Associated atelectasis of the right lung base. The left lung is clear. IMPRESSION:  Moderate cardiomegaly with intermediate sized right pleural effusion and associated atelectasis. Electronically Signed   By: Ulyses Jarred M.D.   On: 11/05/2018 22:39   Dg Abd 2 Views  Result Date: 11/05/2018 CLINICAL DATA:  Dyspnea on exertion. Anemia. EXAM: ABDOMEN - 2 VIEW COMPARISON:  None. FINDINGS: No dilated small bowel or free intraperitoneal air. There is multilevel lumbar spine degenerative change with mild levoscoliosis. No acute osseous abnormality. IMPRESSION: No radiographic evidence of small bowel obstruction. Electronically Signed   By: Ulyses Jarred M.D.   On: 11/05/2018 22:40      LOS: 1 day   Time spent: More than 50% of that time was spent in counseling and/or coordination of care.  Antonieta Pert, MD [facility]  11/07/2018, 2:16 PM

## 2018-11-07 NOTE — Consult Note (Addendum)
Cardiology Consultation:   Patient ID: [name] [name] MRN: [id number]; DOB: April 01, 1946  Admit date: 11/05/2018 Date of Consult: 11/07/2018  Primary Care Provider: [name], MD Primary Cardiologist: Dorris Carnes, MD  Primary Electrophysiologist:  Cristopher Peru, MD  Advanced HF: Dr. Haroldine Laws    Patient Profile:   [name] [name] is a 73 y.o. 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 Lupita Leash, Internal Medicine.   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 She had a cardiac catheterization 2009 that showed normal coronary arteries.  She was recently admitted in January for acute on chronic CHF.  She was managed with aggressive IV diuretics and also  required left-sided thoracentesis for pleural effusion.  She was also found to be anemic with a GIB.  Hemoglobin dropped to 6.1 and she required blood transfusion.  EGD showed erosive gastritis.  She declined colonoscopy.  She also underwent video capsule endoscopy which showed no source of bleed. PPI dose was increased.  She was treated with supplemental iron. Once medically stable she was discharged home on torsemide 80 mg twice daily and supplemental potassium.  Patient presented back to the ED on 11/05/2018.  She was sent from the kidney center for evaluation for abnormal labs.  She was noted to be anemic and also had hypokalemia.  Hemoglobin was 5.2, down from 8.1 2 weeks prior.  Fecal occult blood test was positive but she denied melena an hematochezia. Pt treated w/ blood transfusion.  She continues to decline colonoscopy.  K was low at 2.4.  She was given supplemental potassium and her diuretics were held on admit. The patient had also endorsed recent chest discomfort which was felt to be secondary to her severe anemia.  Her troponin was negative.  She was also found to have worsening renal function.  Creatinine was up to 3.1 from 2.82 weeks prior.  This was also felt likely related to home diuretic use.  Patient was readmitted by internal medicine.  Hemoglobin today is 7.2.  Creatinine remains elevated at 3.28.  Her hypokalemia has resolved.  Potassium is 3.5.  Troponins are mildly elevated but flat trend at 0.05>> 0.06.  EKG shows ventricular paced rhythm.   Cardiology consulted for chest pain.  Patient reports frequent episodes of substernal chest pain feeling like pressure.  Nonradiating.  Occurs with physical activity and resolves with rest.  She is  currently chest pain-free.  She also reports chronic dyspnea.  She reports she has been compliant with her home diuretics but she says that her weight has increased 7 pounds since her last discharge in January.   Past Medical History:  Diagnosis  Date  . Anemia   . Atrial fibrillation ([place])   . Cataract   . Cervical cancer ([place]) 1991   s/p hysterectomy  . Chronic cellulitis   . Chronic combined systolic and diastolic congestive heart failure, NYHA class 2 (HCC)    LVEF 25-30% with restrictive diastolic filling  . Degenerative joint disease   . Diverticulitis   . Essential hypertension   . GERD (gastroesophageal reflux disease)   . Gout   . Kidney stones   . Morbid obesity ([place])   . NICM (nonischemic cardiomyopathy) ([place]) 02/22/2017  . Osteoarthritis   . Permanent atrial fibrillation 05/04/2009   Qualifier: History of  By: Quentin Cornwall CMA, Janett Billow    . 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   Qualifier: Diagnosis of  By: Lovena Le, MD, [facility], Binnie Kand   . Sinoatrial node dysfunction [facility])    Status post PPM - Dr. Lovena Le  . Sleep apnea    CPAP    Past Surgical History:  Procedure Laterality Date  . ABDOMINAL HYSTERECTOMY  1991  . BIOPSY  09/26/2018   Procedure: BIOPSY;  Surgeon: Lavena Bullion, DO;  Location: [facility];  Service: Gastroenterology;;  . COLONOSCOPY  1998   one polyp per patient  . ESOPHAGOGASTRODUODENOSCOPY (EGD) WITH PROPOFOL N/A 09/26/2018   Procedure: ESOPHAGOGASTRODUODENOSCOPY (EGD) WITH PROPOFOL;  Surgeon: Lavena Bullion, DO;  Location: [place];  Service: Gastroenterology;  Laterality: N/A;  . EYE SURGERY    . GIVENS CAPSULE STUDY N/A 10/09/2018   Procedure: GIVENS CAPSULE STUDY;  Surgeon: Carol Ada, MD;  Location: [place];  Service: Endoscopy;  Laterality: N/A;  . IR THORACENTESIS ASP PLEURAL SPACE W/IMG GUIDE  10/09/2018  . PACEMAKER INSERTION  Nov 2000   St Jude with revision in 2011  . PERCUTANEOUS NEPHROLITHOTOMY  April 2012  . RIGHT HEART CATH N/A 06/28/2018   Procedure: RIGHT HEART CATH;  Surgeon: Jolaine Artist, MD;  Location: [place];  Service: Cardiovascular;  Laterality: N/A;  . TEE WITHOUT CARDIOVERSION N/A 06/28/2018   Procedure: TRANSESOPHAGEAL  ECHOCARDIOGRAM (TEE);  Surgeon: Jolaine Artist, MD;  [facility] ENDOSCOPY;  Service: Cardiovascular;  Laterality: N/A;     Home Medications:  Prior to Admission medications   Medication Sig Start Date End Date Taking? Authorizing Provider  allopurinol (ZYLOPRIM) 300 MG tablet Take 300 mg by mouth every evening.    Yes [provider]  aspirin EC 325 MG tablet Take 325 mg by mouth at bedtime.   Yes [provider]  calcitRIOL (ROCALTROL) 0.25 MCG capsule Take 0.25 mcg by mouth every other day.    Yes [provider]  clotrimazole-betamethasone (LOTRISONE) cream Apply 1 application topically 2 (two) times daily as needed (for irritation- affected sites).  07/26/18  Yes [provider]  COD LIVER OIL PO Take 1 capsule by mouth daily.    Yes [provider]  docusate sodium (COLACE) 100 MG capsule Take 100 mg by mouth daily as needed for mild constipation.    Yes [provider]  feeding supplement (BOOST HIGH PROTEIN) LIQD Take 1 Container by mouth 2 (two) times daily.    Yes [provider]  ferrous sulfate 325 (65 FE) MG tablet Take 325 mg by  mouth at bedtime.    Yes [provider]  HYDROcodone-acetaminophen (NORCO) 10-325 MG per tablet Take 1 tablet by mouth See admin instructions. Take 1 tablet by mouth two times a day and an additional 0.5 tablet two times a day as needed for pain   Yes [provider]  isosorbide mononitrate (IMDUR) 30 MG 24 hr tablet Take 0.5 tablets (15 mg total) by mouth daily. Patient taking differently: Take 15 mg by mouth at bedtime.  12/10/17  Yes Fay Records, MD  methocarbamol (ROBAXIN) 500 MG tablet Take 1 tablet (500 mg total) by mouth every 6 (six) hours as needed for muscle spasms. 10/18/18  Yes Shelly Coss, MD  metolazone (ZAROXOLYN) 2.5 MG tablet Take 2.5 mg by mouth every Monday, Wednesday, and Friday.    Yes [provider]  Multiple Vitamin (MULTIVITAMIN WITH  MINERALS) TABS tablet Take 1 tablet by mouth at bedtime.   Yes [provider]  pantoprazole (PROTONIX) 40 MG tablet Take 1 tablet (40 mg total) by mouth 2 (two) times daily. 10/02/18  Yes Mahala Menghini, PA-C  polyethylene glycol (MIRALAX / GLYCOLAX) packet Take 17 g by mouth at bedtime.   Yes [provider]  potassium chloride SA (K-DUR,KLOR-CON) 20 MEQ tablet Start on 10/24/18 Patient taking differently: Take 20-40 mEq by mouth See admin instructions. Take 40 mEq by mouth in the morning and 20 mEq in the evening 10/18/18  Yes Adhikari, Tamsen Meek, MD  torsemide (DEMADEX) 20 MG tablet Take 4 tablets (80 mg total) by mouth 2 (two) times daily. May also take 1 tablet (20 mg total) as needed (for weight greater than 246). Patient taking differently: Take 80 mg by mouth two times a day and an additional 20 mg as needed for a weight gain >246 07/15/18  Yes Clegg, Amy D, NP  vitamin B-12 (CYANOCOBALAMIN) 500 MCG tablet Take 500 mcg by mouth daily.   Yes [provider]  potassium chloride SA (K-DUR,KLOR-CON) 20 MEQ tablet Take 2 tablets (40 mEq total) by mouth 2 (two) times daily for 5 days. Patient not taking: Reported on 11/05/2018 10/19/18 11/05/18  Shelly Coss, MD    Inpatient Medications: Scheduled Meds: . sodium chloride   Intravenous Once  . allopurinol  300 mg Oral QPM  . [START ON 11/08/2018] calcitRIOL  0.25 mcg Oral QODAY  . isosorbide mononitrate  15 mg Oral QHS  . multivitamin with minerals  1 tablet Oral QHS  . pantoprazole  40 mg Oral BID  . torsemide  80 mg Oral BID  . [START ON 11/08/2018] vitamin B-12  500 mcg Oral Daily   Continuous Infusions: . sodium chloride    . ferric gluconate (FERRLECIT/NULECIT) IV 125 mg (11/07/18 1419)   PRN Meds: docusate sodium, HYDROcodone-acetaminophen, methocarbamol  Allergies:    Allergies  Allergen Reactions  . Beta Adrenergic Blockers Anaphylaxis and Other (See Comments)    "DEATH" (Bradycardia and her organs shut  down)  . Cephalexin Shortness Of Breath  . Codeine Anaphylaxis and Swelling    Throat swelling  . Contrast Media [Iodinated Diagnostic Agents] Other (See Comments)    Patient states "I have chronic kidney disease, so the doctor said no dye in my veins."  . Coreg [Carvedilol] Other (See Comments)    Beta Blockers cause her organs to shut down (per patient)  . Peppermint Flavor Shortness Of Breath  . Prednisone Anaphylaxis, Shortness Of Breath and Swelling    Throat swells   . Tape Other (See Comments)  States plastic tape blisters her skin  . Ciprofloxacin Hives    Tolerating levofloxin   . Latex Itching and Rash  . Penicillins Hives    DID THE REACTION INVOLVE: Swelling of the face/tongue/throat, SOB, or low BP? Yes Sudden or severe rash/hives, skin peeling, or the inside of the mouth or nose? Yes Did it require medical treatment? Pt was in hospital at the time of reaction When did it last happen?Was in her mid-40's If all above answers are "NO", may proceed with cephalosporin use.  . Diamox [Acetazolamide] Rash    Rash after 2 doses of diamox     Social History:   Social History   Socioeconomic History  . Marital status: Widowed    Spouse name: Not on file  . Number of children: 1  . Years of education: Not on file  . Highest education level: Some college, no degree  Occupational History  . Not on file  Social Needs  . Financial resource strain: Not hard at all  . Food insecurity:    Worry: Never true    Inability: Never true  . Transportation needs:    Medical: No    Non-medical: No  Tobacco Use  . Smoking status: Former Smoker    Packs/day: 1.00    Types: Cigarettes    Last attempt to quit: 09/26/1979    Years since quitting: 39.1  . Smokeless tobacco: Never Used  Substance and Sexual Activity  . Alcohol use: No  . Drug use: No  . Sexual activity: Never  Lifestyle  . Physical activity:    Days per week: Not on file    Minutes per session: Not on file  .  Stress: Not on file  Relationships  . Social connections:    Talks on phone: Not on file    Gets together: Not on file    Attends religious service: Not on file    Active member of club or organization: Not on file    Attends meetings of clubs or organizations: Not on file    Relationship status: Not on file  . Intimate partner violence:    Fear of current or ex partner: Not on file    Emotionally abused: Not on file    Physically abused: Not on file    Forced sexual activity: Not on file  Other Topics Concern  . Not on file  Social History Narrative  . Not on file    Family History:    Family History  Problem Relation Age of Onset  . Heart attack Father   . Stroke Mother   . Diabetes Sister   . Colon cancer Other        seven family members  . Liver disease Neg Hx   . Other Neg Hx      ROS:  Please see the history of present illness.   All other ROS reviewed and negative.     Physical Exam/Data:   Vitals:   11/07/18 0053 11/07/18 0448 11/07/18 0815 11/07/18 1722  BP: 103/61 (!) 113/58 105/62 120/70  Pulse: 90 97 95 98  Resp: 17 18 18 18   Temp:  97.6 F (36.4 C) 98.1 F (36.7 C)   TempSrc:   Oral   SpO2: 97% 97% 98% 100%  Weight:      Height:        Intake/Output Summary (Last 24 hours) at 11/07/2018 1822 Last data filed at 11/07/2018 1521 Gross per 24 hour  Intake 1105 ml  Output 1340 ml  Net -235 ml   Last 3 Weights 11/06/2018 10/18/2018 10/17/2018  Weight (lbs) 244 lb 11.4 oz 246 lb 11.2 oz 248 lb 4.8 oz  Weight (kg) 111 kg 111.902 kg 112.628 kg     Body mass index is 39.5 kg/m.  General: Elderly obese white female, was sleeping with CPAP when I entered the room.  No acute distress.   HEENT: normal Lymph: no adenopathy Neck: Positive JVD at the level of the ear Endocrine:  No thryomegaly Vascular: No carotid bruits; FA pulses 2+ bilaterally without bruits  Cardiac: Regular rate and rhythm, mitral regurgitation murmur best heard towards the apex  lungs:  clear to auscultation bilaterally, no wheezing, rhonchi or rales  Abd: soft, nontender, no hepatomegaly  Ext: Obese lower extremities with chronic venous stasis dermatitis.  No pitting edema present Musculoskeletal:  No deformities, BUE and BLE strength normal and equal Skin: warm and dry  Neuro:  CNs 2-12 intact, no focal abnormalities noted Psych:  Normal affect   EKG:  The EKG was personally reviewed and demonstrates: V paced Telemetry:  Telemetry was personally reviewed and demonstrates: Paced rhythm in the 90s  Relevant CV Studies:  [facility] 2009  CONCLUSIONS:  1. Normal coronary angiography.  2. Mild elevation of pulmonary artery wedge pressure and moderate      elevation of pulmonary artery pressure.  2D Echo 10/07/18 Study Conclusions  - Left ventricle: The cavity size was normal. Wall thickness was   increased in a pattern of mild LVH. Systolic function was   moderately to severely reduced. The estimated ejection fraction   was in the range of 30% to 35%. Mid to distal anterior, apical   and mid to distal inferior hypokinesis. Incoordinate septal   motion. The study is not technically sufficient to allow   evaluation of LV diastolic function. - Aortic valve: Calcified leafelts. Mild stenosis. Mean gradient   (S): 11 mm Hg. Valve area (VTI): 1.3 cm^2. Valve area (Vmean):   1.45 cm^2. - Mitral valve: Mildly thickened leaflets . Moderate regurgitation. - Left atrium: Severely dilated. - Right ventricle: The cavity size was moderately dilated. Pacer   wire or catheter noted in right ventricle. Systolic function was   normal. - Right atrium: Severely dilated. Pacer wire or catheter noted in   right atrium. - Tricuspid valve: There was moderate regurgitation. - Pulmonary arteries: PA peak pressure: 59 mm Hg (S). - Inferior vena cava: The vessel was dilated. The respirophasic   diameter changes were blunted (< 50%), consistent with elevated   central venous  pressure.  Impressions:  - Compared to a prior study in 06/2018, the LVEF is lower at 30-35%   with mid to distal anterior, inferior and apical hypokinesis.  [place] 06/28/18 Findings:  RA = 19 with prominent v-waves RV = 53/19 PA = 58/26 (40) PCW = 27 v waves = 35-40 Fick cardiac output/index = 6.4/2.9 PVR = 2.0 WU Ao sat = 98% PA sat = 63%, 67%  Assessment:  1. Elevated biventricular pressures with moderate pulmonary venous HTN and prominent v-waves in RA and PCWP tracings suggestive of significant MR/TR 2. Normal cardiac output  Plan/Discussion:  Increase diuretics. TEE later today as part of w/u for MitraClip.    Laboratory Data:  Chemistry Recent Labs  Lab 11/05/18 1934 11/05/18 2343 11/06/18 1112 11/07/18 0420  NA 132*  --  135 136  K 2.4* 2.8* 3.7 3.5  CL 84*  --  89* 94*  CO2 29  --  27 29  GLUCOSE 169*  --  145* 139*  BUN 184*  --  188* 179*  CREATININE 3.13*  --  3.32* 3.28*  CALCIUM 9.3  --  9.0 8.8*  GFRNONAA 14*  --  13* 13*  GFRAA 16*  --  15* 15*  ANIONGAP 19*  --  19* 13    Recent Labs  Lab 11/05/18 1934  PROT 5.8*  ALBUMIN 3.2*  AST 22  ALT 14  ALKPHOS 37*  BILITOT 0.8   Hematology Recent Labs  Lab 11/05/18 1934 11/06/18 1112 11/07/18 0420 11/07/18 1018  WBC 6.1 7.4  --   --   RBC 1.68* 2.01*  --   --   HGB 5.2* 6.2* 7.0* 7.2*  HCT 16.8* 19.7* 22.2* 23.1*  MCV 100.0 98.0  --   --   MCH 31.0 30.8  --   --   MCHC 31.0 31.5  --   --   RDW 21.1* 20.2*  --   --   PLT 151 159  --   --    Cardiac Enzymes Recent Labs  Lab 11/05/18 2224 11/06/18 1112  TROPONINI 0.05* 0.06*    Recent Labs  Lab 11/05/18 1945  TROPIPOC 0.05    BNP Recent Labs  Lab 11/05/18 2224  BNP 228.9*    DDimer No results for input(s): DDIMER in the last 168 hours.  Radiology/Studies:  Dg Chest 2 View  Result Date: 11/05/2018 CLINICAL DATA:  Weakness.  Dyspnea on exertion. EXAM: CHEST - 2 VIEW COMPARISON:  10/09/2018 FINDINGS: Left chest  wall single lead pacemaker unchanged. Moderate cardiomegaly with medium-sized right pleural effusion, greater than on the previous exam. Associated atelectasis of the right lung base. The left lung is clear. IMPRESSION: Moderate cardiomegaly with intermediate sized right pleural effusion and associated atelectasis. Electronically Signed   By: Ulyses Jarred M.D.   On: 11/05/2018 22:39   Dg Abd 2 Views  Result Date: 11/05/2018 CLINICAL DATA:  Dyspnea on exertion. Anemia. EXAM: ABDOMEN - 2 VIEW COMPARISON:  None. FINDINGS: No dilated small bowel or free intraperitoneal air. There is multilevel lumbar spine degenerative change with mild levoscoliosis. No acute osseous abnormality. IMPRESSION: No radiographic evidence of small bowel obstruction. Electronically Signed   By: Ulyses Jarred M.D.   On: 11/05/2018 22:40    Assessment and Plan:   LEKEISHA ARENAS is a 73 y.o. 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 Lupita Leash, Internal Medicine.   1. Chest Pain: Patient had cardiac catheterization in 2009 that showed normal coronary arteries.  She does describe substernal chest discomfort, worse with physical activity and relieved with rest.  This is in the setting of anemia and I suspect that her anemia is contributing to symptoms.  It is possible that she could have underlying  CAD, as it has been more than 10 years since her cardiac catheterization, however given her chronic anemia and frequent GI  bleeds she is not a candidate for invasive cardiac catheterization nor PCI that may necessitate use of antiplatelets.  For this reason we will not pursue any further ischemic work-up.  Recommend management of her chronic anemia.  Try to keep hemoglobin greater than 8.  Imdur has been started by primary team, 15 mg daily. We can see how she does with this.  2. Chronic Combined systolic and diastolic heart failure: EF 30 to 35%.  She is followed in the advanced heart failure clinic.  Her diuretics were initially held at time of initial admit due to acute kidney injury.  Torsemide has since been resumed at 80 mg twice daily.  Monitor volume with strict I's and O's and daily weights.  Low-sodium diet.  3.  Chronic anemia: Patient admitted last month for acute GI bleed and required transfusion.  EGD showed mild gastritis and capsule endoscopy unremarkable.  She refused colonoscopy.  She was readmitted 2/11 with recurrent anemia requiring blood transfusion. Hgb 5.2 on admit. FOBT +. Still refusing colonoscopy. Plan is to continue w/ Fe supplementation and PPI. GI has signed off.   4. Stage IV CKD: Baseline serum creatinine ~2.5.  Creatinine on admit was 3.13 and further increased to 3.32 yesterday.  Level trending down at 3.28 today.  Nephrology following.  5. PAF: Rates controlled.  She is currently not on any AV nodal blocking agents.  She has a permanent pacemaker.  Not on anticoagulation due to chronic anemia and GI bleeds.  6. SA Node Dysfunction: Status post permanent pacemaker.  Followed by Dr. Lovena Le.  7. Hypokalemia: Potassium was 2.4 on admit.  Supplementation was given and diuretics were held initially.  Hypokalemia has resolved.  Potassium today was 3.5.  Diuretics have been resumed.  No scheduled potassium currently ordered.  We will need to follow this closely.  Follow-up BMP  in the morning.  8. Severe MR: Patient has been evaluated by Dr. Burt Knack and not felt to be a suitable candidate for mitral clip.  9.  Obstructive sleep apnea: Compliant with CPAP.  10. Aortic Stenosis: mild by recent echo. Mean gradient 11 mmHg.    For questions or updates, please contact [place] Please consult [email or web address] for contact info under     Signed, Nelida Gores  11/07/2018 6:22 PM   Patient seen and examined with the above-signed Advanced Practice Provider and/or Housestaff. I personally reviewed laboratory data, imaging studies and relevant notes. I independently examined the patient and formulated the important aspects of the plan. I have edited the note to reflect any of my changes or salient points. I have personally discussed the plan with the patient and/or family.  73 y/o woman well known to me from [facility] Clinic. She has multiple medical problems including endstage biventricular HF, severe MR/TR, CKD IV, chronic anemia with recurrent GI bleeding. Recently admitted wit refractory HF, anemia, AKI and R pleural effusion which was tapped. EGD and capsule study negative.  Now readmitted with hgb 5.2 and CP. No overt bleeding.   Has been transfused 2u RBC. Diuresed some but held due to AKI. Seen by GI and refused colonoscopy. Now more SOB with evidence of volume overload on exam. CXR with recurrent large right pleural effusion.   I spoke with her and her daughter. Options increasingly limited for her. Will give 172m IV lasix. Have reached out to [facility] to see if she would be candidate for R Pleurex catheter to address recurrent right pleural effusion.   Agree that colonoscopy is  high risk. I will discuss with GI if a tagged bleeding scan may be of any utility.   She remains DNR/DNI. May be nearing a more palliative situation.  Glori Bickers, MD  9:29 PM

## 2018-11-07 NOTE — Progress Notes (Signed)
Daily Rounding Note  11/07/2018, 11:05 AM  LOS: 1 day   SUBJECTIVE:   Chief complaint:   Recurrent anemia.  FOBT positive but no overt bleeding. Patient still declines offer of colonoscopy.  OBJECTIVE:         Vital signs in last 24 hours:    Temp:  [97.5 F (36.4 C)-98.4 F (36.9 C)] 98.1 F (36.7 C) (02/13 0815) Pulse Rate:  [90-100] 95 (02/13 0815) Resp:  [17-20] 18 (02/13 0815) BP: (97-122)/(48-71) 105/62 (02/13 0815) SpO2:  [95 %-98 %] 98 % (02/13 0815)   Filed Weights   11/06/18 0441  Weight: 111 kg   General: Looks chronically ill.  Poor coloring.  Did not reexamine the patient.   Intake/Output from previous day: 02/12 0701 - 02/13 0700 In: 1035 [P.O.:340; I.V.:50; Blood:645] Out: 1450 [Urine:1450]  Intake/Output this shift: Total I/O In: 200 [P.O.:200] Out: 440 [Urine:440]  Lab Results: Recent Labs    11/05/18 1934 11/06/18 1112 11/07/18 0420 11/07/18 1018  WBC 6.1 7.4  --   --   HGB 5.2* 6.2* 7.0* 7.2*  HCT 16.8* 19.7* 22.2* 23.1*  PLT 151 159  --   --    BMET Recent Labs    11/05/18 1934 11/05/18 2343 11/06/18 1112 11/07/18 0420  NA 132*  --  135 136  K 2.4* 2.8* 3.7 3.5  CL 84*  --  89* 94*  CO2 29  --  27 29  GLUCOSE 169*  --  145* 139*  BUN 184*  --  188* 179*  CREATININE 3.13*  --  3.32* 3.28*  CALCIUM 9.3  --  9.0 8.8*   LFT Recent Labs    11/05/18 1934  PROT 5.8*  ALBUMIN 3.2*  AST 22  ALT 14  ALKPHOS 37*  BILITOT 0.8   PT/INR No results for input(s): LABPROT, INR in the last 72 hours. Hepatitis Panel No results for input(s): HEPBSAG, HCVAB, HEPAIGM, HEPBIGM in the last 72 hours.  Studies/Results: Dg Chest 2 View  Result Date: 11/05/2018 CLINICAL DATA:  Weakness.  Dyspnea on exertion. EXAM: CHEST - 2 VIEW COMPARISON:  10/09/2018 FINDINGS: Left chest wall single lead pacemaker unchanged. Moderate cardiomegaly with medium-sized right pleural effusion, greater  than on the previous exam. Associated atelectasis of the right lung base. The left lung is clear. IMPRESSION: Moderate cardiomegaly with intermediate sized right pleural effusion and associated atelectasis. Electronically Signed   By: Ulyses Jarred M.D.   On: 11/05/2018 22:39   Dg Abd 2 Views  Result Date: 11/05/2018 CLINICAL DATA:  Dyspnea on exertion. Anemia. EXAM: ABDOMEN - 2 VIEW COMPARISON:  None. FINDINGS: No dilated small bowel or free intraperitoneal air. There is multilevel lumbar spine degenerative change with mild levoscoliosis. No acute osseous abnormality. IMPRESSION: No radiographic evidence of small bowel obstruction. Electronically Signed   By: Ulyses Jarred M.D.   On: 11/05/2018 22:40    ASSESMENT:   *    Recurrent acute on chronic, transfusion requiring anemia.  FOBT + recurrent.   EGD 5 weeks ago showed erosive, hemorrhagic gastritis. Capsule endoscopy 3 weeks ago showed no source of bleeding. Colonoscopy last performed in 1998. Received appropriate response to 2 U PRBCs  Hgb 5.2 >> 7.2.   Patient declines colonoscopy  *    NASH cirrhosis.  No signs of gastric varices on recent EGD.  No hepatic masses on ultrasound in 08/2018.  *     OSA, pulmonary hypertension  *  CHF.  Status post pacemaker.   PLAN   *   GI signing off.  She can follow-up with her various specialists including Dr. Trinda Pascal, GI in [place]  11/07/2018, 11:05 AM Phone [phone]

## 2018-11-08 ENCOUNTER — Inpatient Hospital Stay (HOSPITAL_COMMUNITY): Payer: Medicare Other

## 2018-11-08 DIAGNOSIS — N184 Chronic kidney disease, stage 4 (severe): Secondary | ICD-10-CM

## 2018-11-08 DIAGNOSIS — J90 Pleural effusion, not elsewhere classified: Secondary | ICD-10-CM

## 2018-11-08 DIAGNOSIS — I5042 Chronic combined systolic (congestive) and diastolic (congestive) heart failure: Secondary | ICD-10-CM

## 2018-11-08 LAB — BASIC METABOLIC PANEL
Anion gap: 14 (ref 5–15)
BUN: 158 mg/dL — ABNORMAL HIGH (ref 8–23)
CO2: 30 mmol/L (ref 22–32)
Calcium: 8.7 mg/dL — ABNORMAL LOW (ref 8.9–10.3)
Chloride: 94 mmol/L — ABNORMAL LOW (ref 98–111)
Creatinine, Ser: 3.02 mg/dL — ABNORMAL HIGH (ref 0.44–1.00)
GFR calc Af Amer: 17 mL/min — ABNORMAL LOW (ref >60)
GFR calc non Af Amer: 15 mL/min — ABNORMAL LOW (ref >60)
GLUCOSE: 121 mg/dL — AB (ref 70–99)
Potassium: 2.5 mmol/L — CL (ref 3.5–5.1)
Sodium: 138 mmol/L (ref 135–145)

## 2018-11-08 LAB — CBC
HEMATOCRIT: 24.8 % — AB (ref 36.0–46.0)
Hemoglobin: 7.7 g/dL — ABNORMAL LOW (ref 12.0–15.0)
MCH: 28.7 pg (ref 26.0–34.0)
MCHC: 31 g/dL (ref 30.0–36.0)
MCV: 92.5 fL (ref 80.0–100.0)
Platelets: 144 10*3/uL — ABNORMAL LOW (ref 150–400)
RBC: 2.68 MIL/uL — ABNORMAL LOW (ref 3.87–5.11)
RDW: 20.8 % — ABNORMAL HIGH (ref 11.5–15.5)
WBC: 5.4 10*3/uL (ref 4.0–10.5)
nRBC: 0 % (ref 0.0–0.2)

## 2018-11-08 LAB — MAGNESIUM: Magnesium: 3 mg/dL — ABNORMAL HIGH (ref 1.7–2.4)

## 2018-11-08 MED ORDER — POTASSIUM CHLORIDE 10 MEQ/100ML IV SOLN
10.0000 meq | Freq: Once | INTRAVENOUS | Status: AC
  Administered 2018-11-08: 10 meq via INTRAVENOUS
  Filled 2018-11-08: qty 100

## 2018-11-08 MED ORDER — POTASSIUM CHLORIDE CRYS ER 20 MEQ PO TBCR
40.0000 meq | EXTENDED_RELEASE_TABLET | Freq: Once | ORAL | Status: AC
  Administered 2018-11-08: 40 meq via ORAL
  Filled 2018-11-08: qty 2

## 2018-11-08 MED ORDER — SPIRONOLACTONE 12.5 MG HALF TABLET
12.5000 mg | ORAL_TABLET | Freq: Every day | ORAL | Status: DC
  Administered 2018-11-08 – 2018-11-15 (×7): 12.5 mg via ORAL
  Filled 2018-11-08 (×8): qty 1

## 2018-11-08 MED ORDER — POTASSIUM CHLORIDE CRYS ER 20 MEQ PO TBCR
40.0000 meq | EXTENDED_RELEASE_TABLET | ORAL | Status: AC
  Administered 2018-11-08 (×2): 40 meq via ORAL
  Filled 2018-11-08 (×2): qty 2

## 2018-11-08 NOTE — Consult Note (Addendum)
[address]       [place] [address]             [phone]        Marietta M Nesser [facility] Medical Record #[id number] Date of Birth: Jun 04, 1946  Referring: No ref. provider found Primary Care: [name], MD Primary Cardiologist:Paula Harrington Challenger, MD  Chief Complaint:    Chief Complaint  Patient presents with  . Anemia  . Weakness    History of Present Illness:      [name] [name] is a 73 year old morbidly obese female with a complex medical history.  She has chronic atrial fibrillation status post permanent pacemaker, combined systolic and diastolic heart failure, hypertension, stage IV chronic kidney disease, and chronic anemia who was admitted to the hospital due to her low hemoglobin.  Per the patient on admission her hemoglobin was 4.0.  She received packed red blood cells and a work-up began.  She underwent endoscopy which showed erosive hemorrhagic gastritis. GI consulted and following. She states that she has not had a bowel movement since admission which was on Tuesday November 05, 2018.  She ultimately needs a colonoscopy for further evaluation, however there has been notation that she is refusing.  She is also undergoing aggressive diuresis for combined systolic and diastolic heart failure.  She remains extremely fluid overloaded especially in her lower extremity with chronic venous stasis changes.  The patient is currently on her home CPAP machine.  When removed, she states that she does have difficulty breathing.  On chest x-ray there is a moderate right pleural effusion.  She did have a thoracentesis performed but it was several weeks ago.  At home, she is not ambulatory and uses a wheelchair.  We are consulted for a Pleurx catheter placement on the right side to assist with her recurrent pleural effusions.  Current Activity/ Functional Status: Patient was not independent with mobility/ambulation, transfers, ADL's, IADL's.   Zubrod Score: At the time  of surgery this patient's most appropriate activity status/level should be described as: []     0    Normal activity, no symptoms []     1    Restricted in physical strenuous activity but ambulatory, able to do out light work []     2    Ambulatory and capable of self care, unable to do work activities, up and about                 more than 50%  Of the time                            [x]     3    Only limited self care, in bed greater than 50% of waking hours []     4    Completely disabled, no self care, confined to bed or chair []     5    Moribund  Past Medical History:  Diagnosis Date  . Anemia   . Atrial fibrillation ([place])   . Cataract   . Cervical cancer ([place]) 1991   s/p hysterectomy  . Chronic cellulitis   . Chronic combined systolic and diastolic congestive heart failure, NYHA class 2 (H[facility])    LVEF 25-30% with restrictive diastolic filling  . Degenerative joint disease   . Diverticulitis   . Essential hypertension   . GERD (gastroesophageal reflux disease)   . Gout   . Kidney stones   . Morbid obesity (  [place])   . NICM (nonischemic cardiomyopathy) ([place]) 02/22/2017  . Osteoarthritis   . Permanent atrial fibrillation 05/04/2009   Qualifier: History of  By: Quentin Cornwall CMA, [facility] Billow    . 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 Sinoatrial node dysfunction [facility])    Status post PPM - Dr. Lovena Le  . Sleep apnea    CPAP    Past Surgical History:  Procedure Laterality Date  . ABDOMINAL HYSTERECTOMY  1991  . BIOPSY  09/26/2018   Procedure: BIOPSY;  Surgeon: Lavena Bullion, DO;  Location: [facility];  Service: Gastroenterology;;  . COLONOSCOPY  1998   one polyp per patient  . ESOPHAGOGASTRODUODENOSCOPY (EGD) WITH PROPOFOL N/A 09/26/2018   Procedure: ESOPHAGOGASTRODUODENOSCOPY (EGD) WITH PROPOFOL;  Surgeon: Lavena Bullion, DO;  Location: [facility];  Service: Gastroenterology;  Laterality: N/A;  . EYE SURGERY     . GIVENS CAPSULE STUDY N/A 10/09/2018   Procedure: GIVENS CAPSULE STUDY;  Surgeon: Carol Ada, MD;  Location: [facility];  Service: Endoscopy;  Laterality: N/A;  . IR THORACENTESIS ASP PLEURAL SPACE W/IMG GUIDE  10/09/2018  . PACEMAKER INSERTION  Nov 2000   St Jude with revision in 2011  . PERCUTANEOUS NEPHROLITHOTOMY  April 2012  . RIGHT HEART CATH N/A 06/28/2018   Procedure: RIGHT HEART CATH;  Surgeon: Jolaine Artist, MD;  Location: [facility];  Service: Cardiovascular;  Laterality: N/A;  . TEE WITHOUT CARDIOVERSION N/A 06/28/2018   Procedure: TRANSESOPHAGEAL ECHOCARDIOGRAM (TEE);  Surgeon: Jolaine Artist, MD;  Location: [facility] ENDOSCOPY;  Service: Cardiovascular;  Laterality: N/A;    Social History   Tobacco Use  Smoking Status Former Smoker  . Packs/day: 1.00  . Types: Cigarettes  . Last attempt to quit: 09/26/1979  . Years since quitting: 39.1  Smokeless Tobacco Never Used    Social History   Substance and Sexual Activity  Alcohol Use No     Allergies  Allergen Reactions  . Beta Adrenergic Blockers Anaphylaxis and Other (See Comments)    "DEATH" (Bradycardia and her organs shut down)  . Cephalexin Shortness Of Breath  . Codeine Anaphylaxis and Swelling    Throat swelling  . Contrast Media [Iodinated Diagnostic Agents] Other (See Comments)    Patient states "I have chronic kidney disease, so the doctor said no dye in my veins."  . Coreg [Carvedilol] Other (See Comments)    Beta Blockers cause her organs to shut down (per patient)  . Peppermint Flavor Shortness Of Breath  . Prednisone Anaphylaxis, Shortness Of Breath and Swelling    Throat swells   . Tape Other (See Comments)    States plastic tape blisters her skin  . Ciprofloxacin Hives    Tolerating levofloxin   . Latex Itching and Rash  . Penicillins Hives    DID THE REACTION INVOLVE: Swelling of the face/tongue/throat, SOB, or low BP? Yes Sudden or severe rash/hives, skin peeling, or the inside  of the mouth or nose? Yes Did it require medical treatment? Pt was in hospital at the time of reaction When did it last happen?Was in her mid-40's If all above answers are "NO", may proceed with cephalosporin use.  . Diamox [Acetazolamide] Rash    Rash after 2 doses of diamox     Current Facility-Administered Medications  Medication Dose Route Frequency Provider Last Rate Last Dose  . 0.9 %  sodium chloride infusion (Manually program via Guardrails IV Fluids)  Intravenous Once Kc, Ramesh, MD      . 0.9 %  sodium chloride infusion  10 mL/hr Intravenous Once Shela Leff, MD      . allopurinol (ZYLOPRIM) tablet 300 mg  300 mg Oral QPM Kc, Ramesh, MD   300 mg at 11/07/18 1755  . calcitRIOL (ROCALTROL) capsule 0.25 mcg  0.25 mcg Oral Edwena Felty, MD   0.25 mcg at 11/08/18 0853  . docusate sodium (COLACE) capsule 100 mg  100 mg Oral Daily PRN Kc, Ramesh, MD      . ferric gluconate (NULECIT) 125 mg in sodium chloride 0.9 % 100 mL IVPB  125 mg Intravenous Daily Justin Mend, MD 110 mL/hr at 11/08/18 1003 125 mg at 11/08/18 1003  . HYDROcodone-acetaminophen (NORCO) 10-325 MG per tablet 1 tablet  1 tablet Oral BID PRN Shela Leff, MD   1 tablet at 11/08/18 1029  . isosorbide mononitrate (IMDUR) 24 hr tablet 15 mg  15 mg Oral QHS Kc, Ramesh, MD   15 mg at 11/07/18 1500  . methocarbamol (ROBAXIN) tablet 500 mg  500 mg Oral Q6H PRN Shela Leff, MD      . multivitamin with minerals tablet 1 tablet  1 tablet Oral QHS Antonieta Pert, MD   1 tablet at 11/07/18 2209  . pantoprazole (PROTONIX) EC tablet 40 mg  40 mg Oral BID Vena Rua, PA-C   40 mg at 11/08/18 0854  . torsemide (DEMADEX) tablet 80 mg  80 mg Oral BID Antonieta Pert, MD   80 mg at 11/08/18 0854  . vitamin B-12 (CYANOCOBALAMIN) tablet 500 mcg  500 mcg Oral Daily Antonieta Pert, MD   500 mcg at 11/08/18 0854    Medications Prior to Admission  Medication Sig Dispense Refill Last Dose  . allopurinol (ZYLOPRIM) 300 MG  tablet Take 300 mg by mouth every evening.    11/04/2018 at pm  . aspirin EC 325 MG tablet Take 325 mg by mouth at bedtime.   11/04/2018 at 2100  . calcitRIOL (ROCALTROL) 0.25 MCG capsule Take 0.25 mcg by mouth every other day.    unk at [employer]  . clotrimazole-betamethasone (LOTRISONE) cream Apply 1 application topically 2 (two) times daily as needed (for irritation- affected sites).    unk at [employer]  . COD LIVER OIL PO Take 1 capsule by mouth daily.    11/04/2018 at Unknown time  . docusate sodium (COLACE) 100 MG capsule Take 100 mg by mouth daily as needed for mild constipation.    unk at [employer]  . feeding supplement (BOOST HIGH PROTEIN) LIQD Take 1 Container by mouth 2 (two) times daily.    11/05/2018 at Unknown time  . ferrous sulfate 325 (65 FE) MG tablet Take 325 mg by mouth at bedtime.    11/04/2018 at pm  . HYDROcodone-acetaminophen (NORCO) 10-325 MG per tablet Take 1 tablet by mouth See admin instructions. Take 1 tablet by mouth two times a day and an additional 0.5 tablet two times a day as needed for pain   11/05/2018 at am  . isosorbide mononitrate (IMDUR) 30 MG 24 hr tablet Take 0.5 tablets (15 mg total) by mouth daily. (Patient taking differently: Take 15 mg by mouth at bedtime. ) 90 tablet 2 11/04/2018 at pm  . methocarbamol (ROBAXIN) 500 MG tablet Take 1 tablet (500 mg total) by mouth every 6 (six) hours as needed for muscle spasms. 30 tablet 0 unk at unk  . metolazone (ZAROXOLYN) 2.5 MG tablet Take 2.5 mg  by mouth every Monday, Wednesday, and Friday.    11/05/2018 at Unknown time  . Multiple Vitamin (MULTIVITAMIN WITH MINERALS) TABS tablet Take 1 tablet by mouth at bedtime.   11/04/2018 at Unknown time  . pantoprazole (PROTONIX) 40 MG tablet Take 1 tablet (40 mg total) by mouth 2 (two) times daily. 60 tablet 5 11/05/2018 at am  . polyethylene glycol (MIRALAX / GLYCOLAX) packet Take 17 g by mouth at bedtime.   11/04/2018 at pm  . potassium chloride SA (K-DUR,KLOR-CON) 20 MEQ tablet Start on 10/24/18  (Patient taking differently: Take 20-40 mEq by mouth See admin instructions. Take 40 mEq by mouth in the morning and 20 mEq in the evening) 75 tablet 3 11/05/2018 at am  . torsemide (DEMADEX) 20 MG tablet Take 4 tablets (80 mg total) by mouth 2 (two) times daily. May also take 1 tablet (20 mg total) as needed (for weight greater than 246). (Patient taking differently: Take 80 mg by mouth two times a day and an additional 20 mg as needed for a weight gain >246) 270 tablet 7 11/05/2018 at am  . vitamin B-12 (CYANOCOBALAMIN) 500 MCG tablet Take 500 mcg by mouth daily.   11/04/2018 at Unknown time  . potassium chloride SA (K-DUR,KLOR-CON) 20 MEQ tablet Take 2 tablets (40 mEq total) by mouth 2 (two) times daily for 5 days. (Patient not taking: Reported on 11/05/2018) 20 tablet 0 Not Taking at Unknown time    Family History  Problem Relation Age of Onset  . Heart attack Father   . Stroke Mother   . Diabetes Sister   . Colon cancer Other        seven family members  . Liver disease Neg Hx   . Other Neg Hx      Review of Systems:   Review of Systems  Constitutional: Negative for chills and fever.  Respiratory: Positive for cough and shortness of breath.   Cardiovascular: Positive for chest pain and leg swelling.  Gastrointestinal: Positive for abdominal pain, blood in stool and constipation. Negative for vomiting.   Pertinent items are noted in HPI.     Physical Exam: BP 104/67   Pulse 97   Temp 98 F (36.7 C) (Oral)   Resp 18   Ht 5' 6"  (1.676 m)   Wt 111 kg   SpO2 98%   BMI 39.50 kg/m    General appearance: alert, cooperative and mild distress Resp: clear to auscultation bilaterally and diminished in the lower lobes Cardio: regular rate and rhythm, S1, S2 normal, no murmur, click, rub or gallop GI: abdominal pain Extremities: venous stasis dermatitis noted and +++edema in bilateral lower extremity, L > R Neurologic: Grossly normal  Diagnostic Studies & Laboratory data:      Recent Radiology Findings:   Dg Chest Port 1 View  Result Date: 11/08/2018 CLINICAL DATA:  Lower chest pain, shortness of breath EXAM: PORTABLE CHEST 1 VIEW COMPARISON:  11/05/2018 FINDINGS: Slight interval decrease in a small to moderate right pleural effusion with associated atelectasis or consolidation. The left lung base is normally aerated. There is mild, diffuse interstitial pulmonary opacity, likely pulmonary edema similar to prior examination, and gross cardiomegaly with left chest single lead pacer. No new airspace opacity. IMPRESSION: Slight interval decrease in a small to moderate right pleural effusion with associated atelectasis or consolidation. The left lung base is normally aerated. There is mild, diffuse interstitial pulmonary opacity, likely pulmonary edema similar to prior examination, and gross cardiomegaly with left chest single  lead pacer. No new airspace opacity. Electronically Signed   By: Eddie Candle M.D.   On: 11/08/2018 09:26     I have independently reviewed the above radiologic studies and discussed with the patient   Recent Lab Findings: Lab Results  Component Value Date   WBC 5.4 11/08/2018   HGB 7.7 (L) 11/08/2018   HCT 24.8 (L) 11/08/2018   PLT 144 (L) 11/08/2018   GLUCOSE 121 (H) 11/08/2018   CHOL 114 06/21/2018   TRIG 48 06/21/2018   HDL 47 06/21/2018   LDLCALC 57 06/21/2018   ALT 14 11/05/2018   AST 22 11/05/2018   NA 138 11/08/2018   K 2.5 (LL) 11/08/2018   CL 94 (L) 11/08/2018   CREATININE 3.02 (H) 11/08/2018   BUN 158 (H) 11/08/2018   CO2 30 11/08/2018   TSH 4.265 06/23/2018   INR 1.22 09/24/2018   HGBA1C 5.5 06/21/2018      Assessment / Plan:      1. Recurrent pleural effusion-CXR shows moderate right pleural effusion. Did undergo thoracentesis but was a few weeks ago. Patient is on home CPAP. 2. Chronic Anemia-FOBT+, EGD  Showed erosive hemorrhagic gastritis. Needs colonoscopy but seems to be refusing. No recent bowel movement-since  Tuesday per patient. Receiving IV Iron. Recent hemoglobin 7.7. Still having active abdominal pain. 3. Combined systolic and diastolic heart failure-Continue torsemide 72m BID. Also, one time dose of Lasix 1282mIV.  Continues to be volume overloaded. Fluid balance negative. Has pitting bilateral lower extremity edema with chronic venous stasis. Last Echo 10/07/2018 which showed LVEF 30-35%. Heart failure following 4. CKD stage IV-creatinine 3.02 with potassium of 2.5. Care per primary. Nephrology following  5. Afib s/p PPM.  6. Chest pain-Troponin 0.06, not a candidate for cardiac catheterization. Cardiology following  Plan: Pleurx catheter placement explained to the patient in detail and she agrees it would be beneficial. May need intervention before Monday.   I  spent 30 minutes counseling the patient face to face.   TeNicholes RoughPA-C 11/08/2018 1:01 PM  Patient examined, images of echocardiogram and CT scan of chest personally reviewed and discussed with patient. [age]ear old female with multiple significant medical problems followed by the advanced heart failure clinic for nonischemic cardiomyopathy.  The patient developed a large right pleural effusion probably multifactorial in origin with history of systolic and diastolic heart failure, nonalcoholic cirrhosis, and chronic renal failure.  A thoracentesis by IR removed 1.7 L.  Patient was recently readmitted with shortness of breath and chest x-ray shows reaccumulation of a significant right pleural effusion.  This was confirmed by CT scan of chest. Most recent echocardiogram shows EF 30%, atrial fibrillation, dilated right atrium with moderate tricuspid regurgitation.  Patient is on no anticoagulation by her wishes.  Consideration of a right Pleurx catheter was recommended by her cardiologist.  I agree with that recommendation that a Pleurx catheter would best manage her symptoms of shortness of breath related to pleural effusion.  I  discussed the procedure of Pleurx catheter implantation with the patient.  She understands that she would receive monitored conscious sedation in the operating room with local anesthesia for 2 small incisions to place the catheter in the right pleural space.  She understands there would be risk with the procedure but also significant benefit to provide continuous drainage of any recurrent pleural effusion and to avoid the discomfort and risks with multiple thoracentesis.  She agrees to proceed with surgery which will be scheduled for a.m. February 17.  PeCollier Salina  Marvina Danner MD

## 2018-11-08 NOTE — Progress Notes (Signed)
PROGRESS NOTE    [name] [name]  ZOX:[id number] DOB: 28-Nov-1945 DOA: 11/05/2018 PCP: [name], MD   Brief Narrative: HPI as per [name]: 73 y.o. 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  Patient reports having fatigue and dyspnea for the past 2 days.  Denies having any hematemesis, hematochezia, or melena.  Denies having any abdominal pain.  States her gastroenterologist is Dr. Chartered loss adjuster in [place].  Reports having history of angina but states she has been having intermittent substernal chest pressure at rest lasting 30 to 40 seconds for the past few days.  Daughter at bedside states patient takes torsemide and metolazone for her lower extremity edema.  Subjective: Patient complains of some shortness of breath today.  Received 120 of Lasix iv last night.  Potassium was low this morning and being repleted. Denies Chest pain nausea vomiting.    Assessment & Plan:   Symptomatic anemia: Suspecting in the setting of anemia of chronic disease/renal disease status pos 2 unit  transfusion hemoglobin appropriately increased to 7.1->7.2 gm-7/7 gm. Total 3 unit prbc. Also iv iron. Pt denies any rectal bleeding/hematemesis.  She is hemodynamically stable.  He had recent extensive work-up including EGD and capsule endoscopy.  Reports her bowel movement was regular: But was heme positive in the ER.  Seen by GI and patient has refused colonoscopy. Cont Protonix twice daily.  Acute on chronic chronic combined systolic and diastolic CHF with pleural effusion: Compensated.  She is on room air currently.  Patient has chronic appearing leg edema with chronic hyperpigmentation.  Her EF was 30%  on January 2020. No SOB.  Torsemide has been resumed.  Patient did receive 120 mg of Lasix last night as per cardio.   Recurrent Right-sided Pleural effusion : Patient is being planned for CT surgery evaluation for Pleurx catheter.  Cont diuresis.  Chest x-ray showed somewhat improvement.  Chest pain, likely in the setting of severe anemia.  Troponin is at 0.05- 0.06 and flat.  EKG was paced rhythm.  Currently is following closely.  We will try to keep up with hemoglobin.    CKD stage IV, baseline creatinine 2.8- creatinine seem abt the baseline in low 3s.Patient is known to [facility] and is being followed closely.  Tolerating torsemide.    Hypokalemia: Secondary to diuretics.  Being repleted aggressively.  Monitor chemistry.   PAF chads2vas scoreecmd:o pacemaker in place.  She is not on anticoagulation or rate controlling agent.  OSA on CPAP nightly  Hx of SA node dysfunction with pacemaker in place.  DVT prophylaxis: SCD Code Status: Patient has been changed to DNR/DNI. Family Communication:I have updated pt's daughter over the phone. Disposition Plan: Remains inpatient pending clinical improvement.   Consultants:  Gastroenterology consulted. Cardiology Nephrology  Procedures:  Antimicrobials: Anti-infectives (From admission, onward)   None       Objective: Vitals:   11/07/18 2132 11/08/18 0020 11/08/18 0048 11/08/18 0415  BP: (!) 109/58 102/66 (!) 103/56 104/67  Pulse: 93 97 97 97  Resp: 18 18 20 18   Temp: 97.9 F (36.6 C) 98 F (36.7 C) 98.8 F (37.1 C) 98 F (36.7 C)  TempSrc: Oral Oral Oral Oral  SpO2: 94% 95% 95% 98%  Weight:      Height:  Intake/Output Summary (Last 24 hours) at 11/08/2018 1436 Last data filed at 11/08/2018 1239 Gross per 24 hour  Intake 775 ml  Output 4300 ml  Net -3525 ml   Filed Weights   11/06/18 0441  Weight: 111 kg   Weight change:   Body mass index is 39.5 kg/m.  Intake/Output from previous day: 02/13 0701 -  02/14 0700 In: 52 [P.O.:500; Blood:315] Out: 3240 [Urine:3240] Intake/Output this shift: Total I/O In: 460 [P.O.:460] Out: 1500 [Urine:1500]  Examination:  General exam: Calm, comfortable, some dyspneic, older for age, average built.  HEENT:Oral mucosa moist, Ear/Nose WNL grossly, dentition normal. Respiratory system: Bilateral equal air entry, no crackles and wheezing, no use of accessory muscle, non tender on palpation. Cardiovascular system: regular rate and rhythm, S1 & S2 heard, No JVD/murmurs. Gastrointestinal system: Abdomen soft, non-tender, non-distended, BS +. No hepatosplenomegaly palpable. Nervous System:Alert, awake and oriented at baseline. Able to move UE and LE, sensation intact. Extremities: b/ LE edema++, distal peripheral pulses palpable.Chronic hyperpigmentation and skin changes. Skin: No rashes,no icterus. MSK: Normal muscle bulk,tone, power  Medications:  Scheduled Meds: . sodium chloride   Intravenous Once  . allopurinol  300 mg Oral QPM  . calcitRIOL  0.25 mcg Oral QODAY  . isosorbide mononitrate  15 mg Oral QHS  . multivitamin with minerals  1 tablet Oral QHS  . pantoprazole  40 mg Oral BID  . torsemide  80 mg Oral BID  . vitamin B-12  500 mcg Oral Daily   Continuous Infusions: . sodium chloride    . ferric gluconate (FERRLECIT/NULECIT) IV 125 mg (11/08/18 1003)    Data Reviewed: I have personally reviewed following labs and imaging studies  CBC: Recent Labs  Lab 11/05/18 1934 11/06/18 1112 11/07/18 0420 11/07/18 1018 11/08/18 0639  WBC 6.1 7.4  --   --  5.4  HGB 5.2* 6.2* 7.0* 7.2* 7.7*  HCT 16.8* 19.7* 22.2* 23.1* 24.8*  MCV 100.0 98.0  --   --  92.5  PLT 151 159  --   --  884*   Basic Metabolic Panel: Recent Labs  Lab 11/05/18 1934 11/05/18 2224 11/05/18 2343 11/06/18 1112 11/07/18 0420 11/08/18 0639 11/08/18 0829  NA 132*  --   --  135 136 138  --   K 2.4*  --  2.8* 3.7 3.5 2.5*  --   CL 84*  --   --  89* 94* 94*  --     CO2 29  --   --  27 29 30   --   GLUCOSE 169*  --   --  145* 139* 121*  --   BUN 184*  --   --  188* 179* 158*  --   CREATININE 3.13*  --   --  3.32* 3.28* 3.02*  --   CALCIUM 9.3  --   --  9.0 8.8* 8.7*  --   MG  --  3.0*  --   --   --   --  3.0*   GFR: Estimated Creatinine Clearance: 21 mL/min (A) (by C-G formula based on SCr of 3.02 mg/dL (H)). Liver Function Tests: Recent Labs  Lab 11/05/18 1934  AST 22  ALT 14  ALKPHOS 37*  BILITOT 0.8  PROT 5.8*  ALBUMIN 3.2*   No results for input(s): LIPASE, AMYLASE in the last 168 hours. No results for input(s): AMMONIA in the last 168 hours. Coagulation Profile: No results for input(s): INR, PROTIME in the last 168 hours. Cardiac Enzymes: Recent Labs  Lab 11/05/18 2224 11/06/18 1112  TROPONINI 0.05* 0.06*   BNP (last 3 results) No results for input(s): PROBNP in the last 8760 hours. HbA1C: No results for input(s): HGBA1C in the last 72 hours. CBG: No results for input(s): GLUCAP in the last 168 hours. Lipid Profile: No results for input(s): CHOL, HDL, LDLCALC, TRIG, CHOLHDL, LDLDIRECT in the last 72 hours. Thyroid Function Tests: No results for input(s): TSH, T4TOTAL, FREET4, T3FREE, THYROIDAB in the last 72 hours. Anemia Panel: No results for input(s): VITAMINB12, FOLATE, FERRITIN, TIBC, IRON, RETICCTPCT in the last 72 hours. Sepsis Labs: No results for input(s): PROCALCITON, LATICACIDVEN in the last 168 hours.  No results found for this or any previous visit (from the past 240 hour(s)).    Radiology Studies: Dg Chest Port 1 View  Result Date: 11/08/2018 CLINICAL DATA:  Lower chest pain, shortness of breath EXAM: PORTABLE CHEST 1 VIEW COMPARISON:  11/05/2018 FINDINGS: Slight interval decrease in a small to moderate right pleural effusion with associated atelectasis or consolidation. The left lung base is normally aerated. There is mild, diffuse interstitial pulmonary opacity, likely pulmonary edema similar to prior  examination, and gross cardiomegaly with left chest single lead pacer. No new airspace opacity. IMPRESSION: Slight interval decrease in a small to moderate right pleural effusion with associated atelectasis or consolidation. The left lung base is normally aerated. There is mild, diffuse interstitial pulmonary opacity, likely pulmonary edema similar to prior examination, and gross cardiomegaly with left chest single lead pacer. No new airspace opacity. Electronically Signed   By: Eddie Candle M.D.   On: 11/08/2018 09:26      LOS: 2 days   Time spent: More than 50% of that time was spent in counseling and/or coordination of care.  Antonieta Pert, MD [facility]  11/08/2018, 2:36 PM

## 2018-11-08 NOTE — Consult Note (Signed)
   [facility] CM Inpatient Consult   11/08/2018  [name] [name] 05/05/1946 [id number]   Patient screened for extreme high risk score and hospitalizations to check if potential [facility] Care Management services are needed . 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 [per MD notes].  Met with the patient at the bedside.  Patient with her CPAP on.   Patient endorses her Primary Care Provider as [name] Koirala, MD this office is listed to provide the transition of care follow up.  Patient states she is also active with the [facility]  Patient denies any needs from community care management.  She lives in [place]. She denies issues with transportation to MD in [place].  Explained the  benefits of [facility] Care Management services even for telephonic follow up.  Explained that [facility] Care Management is a covered benefit of insurance. Review information for [facility] Care Management and a brochure was provided with contact information and 24 hour nurse line magnet.  Explained that [facility] Care Management does not interfere with or replace any services arranged by the inpatient care management staff.  Patient declined services with [facility] Care Management.   Patient leaving now for CT scan.  Victoria Brewer, RN BSN CCM [facility] Liaison  [phone] business mobile phone Toll free office [phone]    

## 2018-11-08 NOTE — Care Management Important Message (Signed)
Important Message  Patient Details  Name: [name] [name] MRN: [id number] Date of Birth: [birth year]   Medicare Important Message Given:  Yes    Orbie Pyo 11/08/2018, 4:22 PM

## 2018-11-08 NOTE — Progress Notes (Addendum)
Advanced Heart Failure Rounding Note  PCP-Cardiologist: Dorris Carnes, MD   Subjective:    Admitted with hgb 5.2 and chest pain. Transfused total of 3 units PRBCs. Hgb 7.7.   Diuresed with IV lasix 120 mg. I/O not accurate due to incontinence. Creatinine 3.2 -> 3.0. No weight on chart.   Complaining of abdominal pain and increased dyspnea.    Objective:   Weight Range: 111 kg Body mass index is 39.5 kg/m.   Vital Signs:   Temp:  [97.9 F (36.6 C)-98.8 F (37.1 C)] 98 F (36.7 C) (02/14 0415) Pulse Rate:  [93-98] 97 (02/14 0415) Resp:  [18-20] 18 (02/14 0415) BP: (102-120)/(56-70) 104/67 (02/14 0415) SpO2:  [94 %-100 %] 98 % (02/14 0415) Last BM Date: (donesn't know )  Weight change: Filed Weights   11/06/18 0441  Weight: 111 kg    Intake/Output:   Intake/Output Summary (Last 24 hours) at 11/08/2018 1051 Last data filed at 11/08/2018 1005 Gross per 24 hour  Intake 1075 ml  Output 4200 ml  Net -3125 ml      Physical Exam    General:  Appears chronically ill.  Dyspnea moving in the bed.  HEENT: Normal Neck: Supple. JVP 11-12  . Carotids 2+ bilat; no bruits. No lymphadenopathy or thyromegaly appreciated. Cor: PMI nondisplaced. Regular rate & rhythm. No rubs, gallops. 3/6 LSB.  or murmurs. Lungs: Decreased on the right. On room air.  Abdomen: Soft, nontender, nondistended. No hepatosplenomegaly. No bruits or masses. Good bowel sounds. Extremities: No cyanosis, clubbing, rash, 1-2+ edema. Chronic hyperpigmentation in low extremities.  Neuro: Alert & orientedx3, cranial nerves grossly intact. moves all 4 extremities w/o difficulty. Affect pleasant   Telemetry   V paced 90s   EKG    none  Labs    CBC Recent Labs    11/06/18 1112  11/07/18 1018 11/08/18 0639  WBC 7.4  --   --  5.4  HGB 6.2*   < > 7.2* 7.7*  HCT 19.7*   < > 23.1* 24.8*  MCV 98.0  --   --  92.5  PLT 159  --   --  144*   < > = values in this interval not displayed.   Basic  Metabolic Panel Recent Labs    11/05/18 2224  11/07/18 0420 11/08/18 0639 11/08/18 0829  NA  --    < > 136 138  --   K  --    < > 3.5 2.5*  --   CL  --    < > 94* 94*  --   CO2  --    < > 29 30  --   GLUCOSE  --    < > 139* 121*  --   BUN  --    < > 179* 158*  --   CREATININE  --    < > 3.28* 3.02*  --   CALCIUM  --    < > 8.8* 8.7*  --   MG 3.0*  --   --   --  3.0*   < > = values in this interval not displayed.   Liver Function Tests Recent Labs    11/05/18 1934  AST 22  ALT 14  ALKPHOS 37*  BILITOT 0.8  PROT 5.8*  ALBUMIN 3.2*   No results for input(s): LIPASE, AMYLASE in the last 72 hours. Cardiac Enzymes Recent Labs    11/05/18 2224 11/06/18 1112  TROPONINI 0.05* 0.06*    BNP: BNP (  last 3 results) Recent Labs    06/20/18 2249 10/06/18 1309 11/05/18 2224  BNP 160.6* 214.8* 228.9*    ProBNP (last 3 results) No results for input(s): PROBNP in the last 8760 hours.   D-Dimer No results for input(s): DDIMER in the last 72 hours. Hemoglobin A1C No results for input(s): HGBA1C in the last 72 hours. Fasting Lipid Panel No results for input(s): CHOL, HDL, LDLCALC, TRIG, CHOLHDL, LDLDIRECT in the last 72 hours. Thyroid Function Tests No results for input(s): TSH, T4TOTAL, T3FREE, THYROIDAB in the last 72 hours.  Invalid input(s): FREET3  Other results:   Imaging    Dg Chest Port 1 View  Result Date: 11/08/2018 CLINICAL DATA:  Lower chest pain, shortness of breath EXAM: PORTABLE CHEST 1 VIEW COMPARISON:  11/05/2018 FINDINGS: Slight interval decrease in a small to moderate right pleural effusion with associated atelectasis or consolidation. The left lung base is normally aerated. There is mild, diffuse interstitial pulmonary opacity, likely pulmonary edema similar to prior examination, and gross cardiomegaly with left chest single lead pacer. No new airspace opacity. IMPRESSION: Slight interval decrease in a small to moderate right pleural effusion with  associated atelectasis or consolidation. The left lung base is normally aerated. There is mild, diffuse interstitial pulmonary opacity, likely pulmonary edema similar to prior examination, and gross cardiomegaly with left chest single lead pacer. No new airspace opacity. Electronically Signed   By: Eddie Candle M.D.   On: 11/08/2018 09:26      Medications:     Scheduled Medications: . sodium chloride   Intravenous Once  . allopurinol  300 mg Oral QPM  . calcitRIOL  0.25 mcg Oral QODAY  . isosorbide mononitrate  15 mg Oral QHS  . multivitamin with minerals  1 tablet Oral QHS  . pantoprazole  40 mg Oral BID  . potassium chloride  40 mEq Oral Q3H  . torsemide  80 mg Oral BID  . vitamin B-12  500 mcg Oral Daily     Infusions: . sodium chloride    . ferric gluconate (FERRLECIT/NULECIT) IV 125 mg (11/08/18 1003)     PRN Medications:  docusate sodium, HYDROcodone-acetaminophen, methocarbamol    Patient Profile   73 y/o woman well known to me from [facility] Clinic. She has multiple medical problems including endstage biventricular HF, severe MR/TR, CKD IV, chronic anemia with recurrent GI bleeding. Recently admitted wit refractory HF, anemia, AKI and R pleural effusion which was tapped. EGD and capsule study negative.  Now readmitted with hgb 5.2 and CP. No overt bleeding.  Assessment/Plan   1. Chest Pain, in the setting of anemia.   - Resolved. Troponin 0.05>0.6.  - Doubt ACS - Not candidate for cath with AKI and bleeding  2. Symptomatic Anemia  - Hgb 5.2 on admit. Overall transfused 3UPRBCs. Hgb 7.7  - GI consulted. On PPI   - EGD and capsule negative 1/20 - Refused colonscopy. GI signed off.  - Would consider tagged RBC scan if hgb dropping again.    3. A/C combined systolic/diastolic heart failure  - ECHO 06/21/2018 EF 40-45% LV severely dilated.  Mild LVH, akinesis of mid-apicalanteroseptal and apical myocardium, mild AS, severe MR, LA and RA severely dilated, moderate TR, PA  peak pressure 42 mmHg. Cause of cardiomyopathy uncertain. Could be valvular, could be due to chronic RV pacing.  [place] 10/4 showed preserved cardiac output with markedly elevated right and left heart filling pressures (very prominent RV failure). TEE looked like restrictive cardiomyopathy.  - Given 163m  IV lasix yesterday with goo output - Volume status mildly elevated. Continue torsemide 80 mg twice a day.   4. Pleural Effusion, Right -Noted large right pleural effusion on CXR.  -Dr Haroldine Laws discussed with TCTS. Possible pleurex catheter next week.   5. CKD Stage IV -Creatinine baseline ~2.5  -Todays creatinine 3.0. Follow BMET daily.   6.A fib S/P AV nodal ablation and PPM  -Chronic RV pacing in the 90s - She refuses anticoagulation  7. Hypokalemia - K 2.5. Supp aggressively - add spiro while in house only.  7. DNR/DNI    Length of Stay: 2  Amy Clegg, NP  11/08/2018, 10:51 AM  Advanced Heart Failure Team Pager [phone] (M-F; 7a - 4p)  Please contact [facility] for night-coverage after hours (4p -7a ) and weekends on [email or web address]  Patient seen and examined with Darrick Grinder, NP. We discussed all aspects of the encounter. I agree with the assessment and plan as stated above.   Remains tenuous. Dyspneic at rest. Volume status improved with IV lasix. Now back on po torsemide. Has recurrent large R pleural effusion. TCTS consulted for Pleurex. If not able to place soon may need thoracentesis although this would preclude Pleurex as we need fluid in place to place Pleurex. Supp K aggressively. Hgb stable for now. If bleeding recurs consider tagged RBC scan.   Glori Bickers, MD  7:33 PM

## 2018-11-08 NOTE — Progress Notes (Signed)
[facility] Progress Note   Subjective:   No acute complaints. Weighing options to evaluate source of bleed.  Pleurex being considered for pleural effusion.  Diuresed with IV lasix yest, I/Os inaccurate. Cr stable.  Objective Vitals:   11/08/18 0048 11/08/18 0415 11/08/18 1642 11/08/18 2048  BP: (!) 103/56 104/67 98/72 (!) 105/47  Pulse: 97 97 79 99  Resp: 20 18 19 18   Temp: 98.8 F (37.1 C) 98 F (36.7 C) 97.6 F (36.4 C) 98.3 F (36.8 C)  TempSrc: Oral Oral Oral Oral  SpO2: 95% 98% 100% 95%  Weight:      Height:       Physical Exam General: generally fatigued Heart: irreg, syst murmur unchanged Lungs: dec BS bases R > L, difficulty with complete sentences Abdomen: soft Extremities: 1+ edema with chronic changes  Additional Objective Labs: Basic Metabolic Panel: Recent Labs  Lab 11/06/18 1112 11/07/18 0420 11/08/18 0639  NA 135 136 138  K 3.7 3.5 2.5*  CL 89* 94* 94*  CO2 27 29 30   GLUCOSE 145* 139* 121*  BUN 188* 179* 158*  CREATININE 3.32* 3.28* 3.02*  CALCIUM 9.0 8.8* 8.7*   Liver Function Tests: Recent Labs  Lab 11/05/18 1934  AST 22  ALT 14  ALKPHOS 37*  BILITOT 0.8  PROT 5.8*  ALBUMIN 3.2*   No results for input(s): LIPASE, AMYLASE in the last 168 hours. CBC: Recent Labs  Lab 11/05/18 1934 11/06/18 1112 11/07/18 0420 11/07/18 1018 11/08/18 0639  WBC 6.1 7.4  --   --  5.4  HGB 5.2* 6.2* 7.0* 7.2* 7.7*  HCT 16.8* 19.7* 22.2* 23.1* 24.8*  MCV 100.0 98.0  --   --  92.5  PLT 151 159  --   --  144*   Blood Culture    Component Value Date/Time   SDES URINE, RANDOM 10/06/2018 1256   SPECREQUEST NONE 10/06/2018 1256   CULT (A) 10/06/2018 1256    <10,000 COLONIES/mL INSIGNIFICANT GROWTH Performed at [facility] [address], [place], [place] [address]    REPTSTATUS 10/07/2018 FINAL 10/06/2018 1256    Cardiac Enzymes: Recent Labs  Lab 11/05/18 2224 11/06/18 1112  TROPONINI 0.05* 0.06*   CBG: No results  for input(s): GLUCAP in the last 168 hours. Iron Studies: No results for input(s): IRON, TIBC, TRANSFERRIN, FERRITIN in the last 72 hours. @lablastinr3 @ Studies/Results: Ct Chest Wo Contrast  Result Date: 11/08/2018 CLINICAL DATA:  Chest pain and shortness of breath. Pleural effusion. Right thoracentesis 10/09/2018. EXAM: CT CHEST WITHOUT CONTRAST TECHNIQUE: Multidetector CT imaging of the chest was performed following the standard protocol without IV contrast. COMPARISON:  Radiographs today and earlier. Abdominal CT 04/05/2016. No previous chest CT. FINDINGS: Cardiovascular: There is atherosclerosis of the aorta, great vessels and coronary arteries. Left subclavian pacemaker lead extends to the right ventricular apex. The heart is enlarged. There are aortic valvular calcifications. No significant pericardial effusion. Mediastinum/Nodes: There are no enlarged mediastinal, hilar or axillary lymph nodes.Hilar assessment is limited by the lack of intravenous contrast. 10 mm low-density right thyroid lesion on image 11/3 is unlikely to be clinically significant. The trachea and esophagus appear unremarkable. Lungs/Pleura: There is a recurrent moderate size dependent right pleural effusion which has increased since the thoracentesis done 1 month ago. There is trace pleural fluid on the left. The right lower lobe is completely collapsed and consolidated. No discrete endobronchial lesion identified. There is patchy dependent atelectasis elsewhere in the lungs. There are no suspicious nodules within  the aerated portions of the lungs. Upper abdomen: Upper abdominal ascites has improved compared with the previous abdominal CT. The IVC and hepatic veins are distended. There is contour irregularity of the liver, suspicious for underlying cirrhosis. Musculoskeletal/Chest wall: There is no chest wall mass or suspicious osseous finding. Moderate glenohumeral arthropathy bilaterally. IMPRESSION: 1. Recurrence of a  moderate-sized pleural effusion following thoracentesis 1 month ago. This pleural effusion is suboptimally characterized without contrast. Correlation with prior thoracentesis results recommended. 2. Complete collapse/consolidation of the right lower lobe without visible endobronchial lesion. Patchy atelectasis elsewhere in the lungs. 3. Cardiomegaly, probable aortic valvular calcifications and Aortic Atherosclerosis (ICD10-I70.0). 4. Probable hepatic cirrhosis as suggested previously. Electronically Signed   By: Richardean Sale M.D.   On: 11/08/2018 19:16   Dg Chest Port 1 View  Result Date: 11/08/2018 CLINICAL DATA:  Lower chest pain, shortness of breath EXAM: PORTABLE CHEST 1 VIEW COMPARISON:  11/05/2018 FINDINGS: Slight interval decrease in a small to moderate right pleural effusion with associated atelectasis or consolidation. The left lung base is normally aerated. There is mild, diffuse interstitial pulmonary opacity, likely pulmonary edema similar to prior examination, and gross cardiomegaly with left chest single lead pacer. No new airspace opacity. IMPRESSION: Slight interval decrease in a small to moderate right pleural effusion with associated atelectasis or consolidation. The left lung base is normally aerated. There is mild, diffuse interstitial pulmonary opacity, likely pulmonary edema similar to prior examination, and gross cardiomegaly with left chest single lead pacer. No new airspace opacity. Electronically Signed   By: Eddie Candle M.D.   On: 11/08/2018 09:26   Medications: . sodium chloride    . ferric gluconate (FERRLECIT/NULECIT) IV 125 mg (11/08/18 1003)   . sodium chloride   Intravenous Once  . allopurinol  300 mg Oral QPM  . calcitRIOL  0.25 mcg Oral QODAY  . isosorbide mononitrate  15 mg Oral QHS  . multivitamin with minerals  1 tablet Oral QHS  . pantoprazole  40 mg Oral BID  . potassium chloride  40 mEq Oral Once  . spironolactone  12.5 mg Oral Daily  . torsemide  80 mg  Oral BID  . vitamin B-12  500 mcg Oral Daily    Dialysis Orders:  Assessment/Plan: Assessment/Plan:  1.  Anemia:  Presumed GI source.  Transfused and responded.  Discussion re: colonoscopy but as she has declined to date GI no longer following vs tagged scan if bleeds.   Iron deficient - IV iron ordered, the blood transfusions will help too.   2.  CKD 4:  Creatinine within recent baseline currently.  Watch closely.   3.  H/o MR, TR, CHF: cardiology to consult while admitted.  She is near her baseline volume status.  Back to po torsemide  4.  Atrial fibrillation with AV node ablated and PP  5.  Pleural effusion: pleurex v thoracentesis. Hopefully will give her some symptomatic relief.   Jannifer Hick MD 11/08/2018, 9:00 PM  [facility] Pager: [phone]

## 2018-11-09 DIAGNOSIS — I5023 Acute on chronic systolic (congestive) heart failure: Secondary | ICD-10-CM

## 2018-11-09 DIAGNOSIS — N179 Acute kidney failure, unspecified: Secondary | ICD-10-CM

## 2018-11-09 LAB — URINALYSIS, ROUTINE W REFLEX MICROSCOPIC
Bacteria, UA: NONE SEEN
Bilirubin Urine: NEGATIVE
Glucose, UA: NEGATIVE mg/dL
Hgb urine dipstick: NEGATIVE
Ketones, ur: NEGATIVE mg/dL
Nitrite: NEGATIVE
Protein, ur: NEGATIVE mg/dL
Specific Gravity, Urine: 1.009 (ref 1.005–1.030)
pH: 7 (ref 5.0–8.0)

## 2018-11-09 LAB — TYPE AND SCREEN
ABO/RH(D): O POS
ANTIBODY SCREEN: POSITIVE
UNIT DIVISION: 0
Unit division: 0
Unit division: 0

## 2018-11-09 LAB — BPAM RBC
Blood Product Expiration Date: 202002282359
Blood Product Expiration Date: 202003172359
Blood Product Expiration Date: 202003182359
ISSUE DATE / TIME: 202002120510
ISSUE DATE / TIME: 202002121842
ISSUE DATE / TIME: 202002140026
Unit Type and Rh: 5100
Unit Type and Rh: 5100
Unit Type and Rh: 5100

## 2018-11-09 LAB — CBC WITH DIFFERENTIAL/PLATELET
ABS IMMATURE GRANULOCYTES: 0.02 10*3/uL (ref 0.00–0.07)
Basophils Absolute: 0 10*3/uL (ref 0.0–0.1)
Basophils Relative: 0 %
Eosinophils Absolute: 0.2 10*3/uL (ref 0.0–0.5)
Eosinophils Relative: 3 %
HCT: 25.7 % — ABNORMAL LOW (ref 36.0–46.0)
Hemoglobin: 7.7 g/dL — ABNORMAL LOW (ref 12.0–15.0)
Immature Granulocytes: 0 %
Lymphocytes Relative: 10 %
Lymphs Abs: 0.7 10*3/uL (ref 0.7–4.0)
MCH: 28.1 pg (ref 26.0–34.0)
MCHC: 30 g/dL (ref 30.0–36.0)
MCV: 93.8 fL (ref 80.0–100.0)
Monocytes Absolute: 0.5 10*3/uL (ref 0.1–1.0)
Monocytes Relative: 7 %
NEUTROS ABS: 5.4 10*3/uL (ref 1.7–7.7)
Neutrophils Relative %: 80 %
PLATELETS: 158 10*3/uL (ref 150–400)
RBC: 2.74 MIL/uL — ABNORMAL LOW (ref 3.87–5.11)
RDW: 20.5 % — ABNORMAL HIGH (ref 11.5–15.5)
WBC: 6.7 10*3/uL (ref 4.0–10.5)
nRBC: 0 % (ref 0.0–0.2)

## 2018-11-09 LAB — BASIC METABOLIC PANEL
Anion gap: 13 (ref 5–15)
Anion gap: 11 (ref 5–15)
BUN: 146 mg/dL — ABNORMAL HIGH (ref 8–23)
BUN: 140 mg/dL — ABNORMAL HIGH (ref 8–23)
CO2: 31 mmol/L (ref 22–32)
CO2: 32 mmol/L (ref 22–32)
Calcium: 9 mg/dL (ref 8.9–10.3)
Calcium: 8.8 mg/dL — ABNORMAL LOW (ref 8.9–10.3)
Chloride: 91 mmol/L — ABNORMAL LOW (ref 98–111)
Chloride: 92 mmol/L — ABNORMAL LOW (ref 98–111)
Creatinine, Ser: 2.99 mg/dL — ABNORMAL HIGH (ref 0.44–1.00)
Creatinine, Ser: 3.04 mg/dL — ABNORMAL HIGH (ref 0.44–1.00)
GFR calc Af Amer: 17 mL/min — ABNORMAL LOW (ref >60)
GFR calc Af Amer: 17 mL/min — ABNORMAL LOW (ref >60)
GFR calc non Af Amer: 15 mL/min — ABNORMAL LOW (ref >60)
GFR calc non Af Amer: 15 mL/min — ABNORMAL LOW (ref >60)
GLUCOSE: 141 mg/dL — AB (ref 70–99)
Glucose, Bld: 163 mg/dL — ABNORMAL HIGH (ref 70–99)
Potassium: 3 mmol/L — ABNORMAL LOW (ref 3.5–5.1)
Potassium: 3.3 mmol/L — ABNORMAL LOW (ref 3.5–5.1)
Sodium: 135 mmol/L (ref 135–145)
Sodium: 135 mmol/L (ref 135–145)

## 2018-11-09 LAB — SURGICAL PCR SCREEN
MRSA, PCR: NEGATIVE
Staphylococcus aureus: NEGATIVE

## 2018-11-09 LAB — APTT: aPTT: 33 seconds (ref 24–36)

## 2018-11-09 MED ORDER — POTASSIUM CHLORIDE CRYS ER 20 MEQ PO TBCR
40.0000 meq | EXTENDED_RELEASE_TABLET | ORAL | Status: AC
  Administered 2018-11-09 (×2): 40 meq via ORAL
  Filled 2018-11-09 (×2): qty 2

## 2018-11-09 MED ORDER — POTASSIUM CHLORIDE 10 MEQ/100ML IV SOLN
10.0000 meq | INTRAVENOUS | Status: AC
  Administered 2018-11-09 (×2): 10 meq via INTRAVENOUS
  Filled 2018-11-09 (×2): qty 100

## 2018-11-09 MED ORDER — MAGNESIUM OXIDE 400 (241.3 MG) MG PO TABS: 400.0000 mg | ORAL_TABLET | Freq: Two times a day (BID) | ORAL | Status: DC

## 2018-11-09 MED ORDER — FUROSEMIDE 10 MG/ML IJ SOLN
120.0000 mg | Freq: Once | INTRAVENOUS | Status: AC
  Administered 2018-11-09: 120 mg via INTRAVENOUS
  Filled 2018-11-09: qty 10

## 2018-11-09 NOTE — Progress Notes (Signed)
Patient ID: [name] [name], female   DOB: April 29, 1946, 73 y.o.   MRN: [id number]     Advanced Heart Failure Rounding Note  PCP-Cardiologist: Dorris Carnes, MD   Subjective:    Admitted with hgb 5.2 and chest pain. Transfused total of 3 units PRBCs. Hgb 7.7, stable compared to yesterday.    Creatinine down to 2.99, BUN remains high at 146 but trending down.  Stable BP.    She is tachypneic and says that she is short of breath with activity.    Objective:   Weight Range: 112.4 kg Body mass index is 40 kg/m.   Vital Signs:   Temp:  [97.6 F (36.4 C)-98.3 F (36.8 C)] 97.8 F (36.6 C) (02/15 0822) Pulse Rate:  [79-100] 98 (02/15 0822) Resp:  [18-19] 19 (02/15 0822) BP: (98-117)/(47-74) 117/60 (02/15 0822) SpO2:  [95 %-100 %] 99 % (02/15 0822) Weight:  [112.4 kg] 112.4 kg (02/14 2048) Last BM Date: (donesn't know )  Weight change: Filed Weights   11/06/18 0441 11/08/18 2048  Weight: 111 kg 112.4 kg    Intake/Output:   Intake/Output Summary (Last 24 hours) at 11/09/2018 1113 Last data filed at 11/09/2018 7062 Gross per 24 hour  Intake 1550 ml  Output 2100 ml  Net -550 ml      Physical Exam    General: NAD Neck: JVP 16+, no thyromegaly or thyroid nodule.  Lungs: Decreased BS right base.  CV: Nondisplaced PMI.  Heart regular S1/S2, no S3/S4, no murmur.  1+ edema ankles.   Abdomen: Soft, nontender, no hepatosplenomegaly, no distention.  Skin: Intact without lesions or rashes.  Neurologic: Alert and oriented x 3.  Psych: Normal affect. Extremities: No clubbing or cyanosis.  HEENT: Normal.    Telemetry   V paced 90s with underlying atrial fibrillation (personally reviewed)  EKG    none  Labs    CBC Recent Labs    11/08/18 0639 11/09/18 0835  WBC 5.4 6.7  NEUTROABS  --  5.4  HGB 7.7* 7.7*  HCT 24.8* 25.7*  MCV 92.5 93.8  PLT 144* 376   Basic Metabolic Panel Recent Labs    11/08/18 0639 11/08/18 0829 11/09/18 0835  NA 138  --  135  K 2.5*  --   3.0*  CL 94*  --  91*  CO2 30  --  31  GLUCOSE 121*  --  163*  BUN 158*  --  146*  CREATININE 3.02*  --  2.99*  CALCIUM 8.7*  --  9.0  MG  --  3.0*  --    Liver Function Tests No results for input(s): AST, ALT, ALKPHOS, BILITOT, PROT, ALBUMIN in the last 72 hours. No results for input(s): LIPASE, AMYLASE in the last 72 hours. Cardiac Enzymes No results for input(s): CKTOTAL, CKMB, CKMBINDEX, TROPONINI in the last 72 hours.  BNP: BNP (last 3 results) Recent Labs    06/20/18 2249 10/06/18 1309 11/05/18 2224  BNP 160.6* 214.8* 228.9*    ProBNP (last 3 results) No results for input(s): PROBNP in the last 8760 hours.   D-Dimer No results for input(s): DDIMER in the last 72 hours. Hemoglobin A1C No results for input(s): HGBA1C in the last 72 hours. Fasting Lipid Panel No results for input(s): CHOL, HDL, LDLCALC, TRIG, CHOLHDL, LDLDIRECT in the last 72 hours. Thyroid Function Tests No results for input(s): TSH, T4TOTAL, T3FREE, THYROIDAB in the last 72 hours.  Invalid input(s): FREET3  Other results:   Imaging    Ct Chest  Wo Contrast  Result Date: 11/08/2018 CLINICAL DATA:  Chest pain and shortness of breath. Pleural effusion. Right thoracentesis 10/09/2018. EXAM: CT CHEST WITHOUT CONTRAST TECHNIQUE: Multidetector CT imaging of the chest was performed following the standard protocol without IV contrast. COMPARISON:  Radiographs today and earlier. Abdominal CT 04/05/2016. No previous chest CT. FINDINGS: Cardiovascular: There is atherosclerosis of the aorta, great vessels and coronary arteries. Left subclavian pacemaker lead extends to the right ventricular apex. The heart is enlarged. There are aortic valvular calcifications. No significant pericardial effusion. Mediastinum/Nodes: There are no enlarged mediastinal, hilar or axillary lymph nodes.Hilar assessment is limited by the lack of intravenous contrast. 10 mm low-density right thyroid lesion on image 11/3 is unlikely to  be clinically significant. The trachea and esophagus appear unremarkable. Lungs/Pleura: There is a recurrent moderate size dependent right pleural effusion which has increased since the thoracentesis done 1 month ago. There is trace pleural fluid on the left. The right lower lobe is completely collapsed and consolidated. No discrete endobronchial lesion identified. There is patchy dependent atelectasis elsewhere in the lungs. There are no suspicious nodules within the aerated portions of the lungs. Upper abdomen: Upper abdominal ascites has improved compared with the previous abdominal CT. The IVC and hepatic veins are distended. There is contour irregularity of the liver, suspicious for underlying cirrhosis. Musculoskeletal/Chest wall: There is no chest wall mass or suspicious osseous finding. Moderate glenohumeral arthropathy bilaterally. IMPRESSION: 1. Recurrence of a moderate-sized pleural effusion following thoracentesis 1 month ago. This pleural effusion is suboptimally characterized without contrast. Correlation with prior thoracentesis results recommended. 2. Complete collapse/consolidation of the right lower lobe without visible endobronchial lesion. Patchy atelectasis elsewhere in the lungs. 3. Cardiomegaly, probable aortic valvular calcifications and Aortic Atherosclerosis (ICD10-I70.0). 4. Probable hepatic cirrhosis as suggested previously. Electronically Signed   By: Richardean Sale M.D.   On: 11/08/2018 19:16     Medications:     Scheduled Medications: . sodium chloride   Intravenous Once  . allopurinol  300 mg Oral QPM  . calcitRIOL  0.25 mcg Oral QODAY  . isosorbide mononitrate  15 mg Oral QHS  . multivitamin with minerals  1 tablet Oral QHS  . pantoprazole  40 mg Oral BID  . potassium chloride  40 mEq Oral Q3H  . spironolactone  12.5 mg Oral Daily  . vitamin B-12  500 mcg Oral Daily    Infusions: . sodium chloride    . ferric gluconate (FERRLECIT/NULECIT) IV 125 mg (11/08/18  1003)  . furosemide    . potassium chloride 10 mEq (11/09/18 1105)    PRN Medications: docusate sodium, HYDROcodone-acetaminophen, methocarbamol    Patient Profile   73 y/o woman well known to me from [facility]. She has multiple medical problems including endstage biventricular HF, severe MR/TR, CKD IV, chronic anemia with recurrent GI bleeding. Recently admitted wit refractory HF, anemia, AKI and R pleural effusion which was tapped. EGD and capsule study negative.  Now readmitted with hgb 5.2 and CP. No overt bleeding.  Assessment/Plan   1. Chest Pain, in the setting of anemia.   - Resolved. Troponin 0.05>0.6.  - Doubt ACS - Not candidate for cath with AKI and bleeding  2. Symptomatic Anemia  - Hgb 5.2 on admit. Overall transfused 3U PRBCs. Hgb 7.7 today, stable compared to yesterday.   - GI consulted. On PPI   - EGD and capsule negative 1/20 - She says she would be willing to have colonoscopy but would stabilize her more clinically before undertaking procedure (  Pleurx, further diuresis given significant dyspnea).   - Could consider tagged RBC scan if hgb dropping again.    3. A/C combined systolic/diastolic heart failure  - ECHO 06/21/2018 EF 40-45% LV severely dilated.  Mild LVH, akinesis of mid-apicalanteroseptal and apical myocardium, mild AS, severe MR, LA and RA severely dilated, moderate TR, PA peak pressure 42 mmHg. [place] 10/19 showed preserved cardiac output with markedly elevated right and left heart filling pressures (very prominent RV failure). TEE looked like restrictive cardiomyopathy. Repeat echo in 1/20 with EF 30-35% (lower), wall motion abnormalities, moderate MR, moderately dilated RV, moderate TR, PASP 59 mmHg.  Cause of cardiomyopathy uncertain, chronic RV pacing may play a role.  - She appears significantly volume overloaded today with JVD and dyspnea.  However, BUN and creatinine are very high.  She had torsemide this morning.  Hold torsemide, will give Lasix 120  mg IV x 1 and follow UOP.   4. Pleural Effusion, Right - Noted large right pleural effusion on CXR.  - Dr. Prescott Gum to see, plan for Pleurx catheter placement.   5. CKD Stage IV - Creatinine baseline ~2.5  - Today's creatinine 2.99. Follow BMET daily.   6. A fib S/P AV nodal ablation and PPM  - Chronic RV pacing in the 90s - She has refused anticoagulation in the past and now also has chronic GI bleeding.   7. Hypokalemia - K 3. Supp aggressively - added spironolactone while in house only.  7. DNR/DNI    Length of Stay: 3  Loralie Champagne, MD  11/09/2018, 11:13 AM  Advanced Heart Failure Team Pager [phone] (M-F; [place])  Please contact [facility] for night-coverage after hours (4p -7a ) and weekends on [email or web address]

## 2018-11-09 NOTE — Progress Notes (Signed)
Pt has home cpap and places herself on when ready.

## 2018-11-09 NOTE — Progress Notes (Signed)
PROGRESS NOTE    [name] [name]  AJO:[id number] DOB: 1946/06/29 DOA: 11/05/2018 PCP: [name] Amel, MD   Brief Narrative: HPI as per Dr [name] [name]: 73 y.o. 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  Patient reports having fatigue and dyspnea for the past 2 days.  Denies having any hematemesis, hematochezia, or melena.  Denies having any abdominal pain.  States her gastroenterologist is Dr. [facility] loss [name] in [place].  Reports having history of angina but states she has been having intermittent substernal chest pressure at rest lasting 30 to 40 seconds for the past few days.  Daughter at bedside states patient takes torsemide and metolazone for her lower extremity edema.  Subjective: No acute events overnight. Resting comfortably, on room air.  Hemoglobin remained stable.  Patient had a bowel movement this morning denies any bleeding.  Assessment & Plan:   Symptomatic anemia: Suspecting in the setting of anemia of chronic disease/renal disease status pos total 3 unit, HB at 7.7 gm.  also on iv  ferric gluconate 125 mg x4 doses. Pt denies any rectal bleeding/hematemesis. Normal BM today. She is hemodynamically stable.He had recent extensive work-up including EGD and capsule endoscopy.  Reports her bowel movement was regular: But was heme positive in the ER.  Seen by GI and patient has refused colonoscopy. Cont Protonix twice daily.  Hemoglobin has remained stable, if she has drop in hemoglobin can consider bleeding scan. Recent Labs  Lab 11/06/18 1112 11/07/18 0420 11/07/18 1018 11/08/18 0639 11/09/18 0835  HGB 6.2* 7.0* 7.2* 7.7* 7.7*  HCT 19.7* 22.2* 23.1* 24.8* 25.7*   Acute on  chronic chronic combined systolic and diastolic CHF with pleural effusion: has chronic appearing leg edema with chronic hyperpigmentation-somewhat worse from baseline and remains on diuretics torsemide 80 twice daily, Aldactone. Last lvef is  30% in January 2020. S/p 120 mg of Lasix iv 2/12 night.Cardiology following.  Recurrent Right-sided Pleural effusion:Patient is being planned for CT surgery evaluation for Pleurx catheter likely on Monday but if more symptomatic will need early. Cont diuresis.  Chest pain, likely in the setting of severe anemia.Troponin is at 0.05- 0.06 and flat.  EKG was paced rhythm.  Currently is following closely.  We will try to keep up with hemoglobin.  Unable to use antiplatelet due to her anemia, continue Imdur.  Cardiology is following.  CKD stage IV, baseline creatinine 2.8- creatinine seem abt the baseline in low 3s.Patient is known to [facility] and is being followed closely.  Tolerating torsemide.  BMP pending today. Recent Labs  Lab 11/05/18 1934 11/06/18 1112 11/07/18 0420 11/08/18 0639 11/09/18 0835  CREATININE 3.13* 3.32* 3.28* 3.02* 2.99*    Hypokalemia: was repleted. Bmp this morning at 3.0 remains low will repleted aggressively again, 40 meq po x 2, and 84mq [name] [name] iv, repeat bmp in afternoon. Aldactone  Started 2/14 . Recent Labs  Lab 11/05/18 2343 11/06/18 1112 11/07/18 0420 11/08/18 0639 11/09/18 0835  K 2.8* 3.7 3.5 2.5* 3.0*   PAF , status post AV nodal ablation and PPM.  Chronic RV pacing in the 90s.  She has refused anticoagulation.  OSA on CPAP nightly  Hx of SA node dysfunction with pacemaker in place.  DVT prophylaxis: SCD Code Status: Patient has been changed to DNR/DNI. Family Communication:I  have updated pt's daughter over the phone previously. No family at bedside.. Disposition Plan: Remains inpatient pending clinical improvement.   Consultants:  Gastroenterology  consulted. Cardiology Nephrology  Procedures:  Antimicrobials: Anti-infectives (From admission, onward)   None       Objective: Vitals:   11/08/18 1642 11/08/18 2048 11/09/18 0318 11/09/18 0822  BP: 98/72 (!) 105/47 106/74 117/60  Pulse: 79 99 100 98  Resp: 19 18 18 19   Temp: 97.6 F (36.4 C) 98.3 F (36.8 C) 98.1 F (36.7 C) 97.8 F (36.6 C)  TempSrc: Oral Oral Oral Oral  SpO2: 100% 95% 98% 99%  Weight:  112.4 kg    Height:        Intake/Output Summary (Last 24 hours) at 11/09/2018 0940 Last data filed at 11/09/2018 0825 Gross per 24 hour  Intake 1550 ml  Output 2100 ml  Net -550 ml   Filed Weights   11/06/18 0441 11/08/18 2048  Weight: 111 kg 112.4 kg   Weight change:   Body mass index is 40 kg/m.  Intake/Output from previous day: 02/14 0701 - 02/15 0700 In: 7425 [P.O.:1540; I.V.:10; IV Piggyback:100] Out: 3200 [Urine:3200] Intake/Output this shift: Total I/O In: 360 [P.O.:360] Out: 100 [Urine:100]  Examination: General exam: Calm, comfortable, not in acute distress, older for age, average built.  HEENT:Oral mucosa moist, Ear/Nose WNL grossly, dentition normal. Respiratory system: Diminished breath sounds in the right lung, basal crackles present, no use of accessory muscle, non tender on palpation. Cardiovascular system: regular rate and rhythm, S1 & S2 heard, No JVD/murmurs. Gastrointestinal system: Abdomen soft, non-tender, non-distended, BS +. No hepatosplenomegaly palpable. Nervous System:Alert, awake and oriented at baseline. Able to move UE and LE, sensation intact. Extremities: Bilateral lower leg edema, chronic appearing, with chronic hyperpigmentation, lower extremities are warm   Skin: No rashes,no icterus. MSK: Normal muscle bulk,tone, power  Medications:  Scheduled Meds: . sodium chloride   Intravenous Once  . allopurinol  300 mg Oral QPM  . calcitRIOL  0.25 mcg Oral QODAY  . isosorbide mononitrate  15 mg Oral QHS  . multivitamin  with minerals  1 tablet Oral QHS  . pantoprazole  40 mg Oral BID  . spironolactone  12.5 mg Oral Daily  . torsemide  80 mg Oral BID  . vitamin B-12  500 mcg Oral Daily   Continuous Infusions: . sodium chloride    . ferric gluconate (FERRLECIT/NULECIT) IV 125 mg (11/08/18 1003)    Data Reviewed: I have personally reviewed following labs and imaging studies  CBC: Recent Labs  Lab 11/05/18 1934 11/06/18 1112 11/07/18 0420 11/07/18 1018 11/08/18 0639 11/09/18 0835  WBC 6.1 7.4  --   --  5.4 6.7  NEUTROABS  --   --   --   --   --  5.4  HGB 5.2* 6.2* 7.0* 7.2* 7.7* 7.7*  HCT 16.8* 19.7* 22.2* 23.1* 24.8* 25.7*  MCV 100.0 98.0  --   --  92.5 93.8  PLT 151 159  --   --  144* 956   Basic Metabolic Panel: Recent Labs  Lab 11/05/18 1934 11/05/18 2224 11/05/18 2343 11/06/18 1112 11/07/18 0420 11/08/18 0639 11/08/18 0829  NA 132*  --   --  135 136 138  --   K 2.4*  --  2.8* 3.7 3.5 2.5*  --   CL 84*  --   --  89* 94* 94*  --   CO2 29  --   --  27 29 30   --  GLUCOSE 169*  --   --  145* 139* 121*  --   BUN 184*  --   --  188* 179* 158*  --   CREATININE 3.13*  --   --  3.32* 3.28* 3.02*  --   CALCIUM 9.3  --   --  9.0 8.8* 8.7*  --   MG  --  3.0*  --   --   --   --  3.0*   GFR: Estimated Creatinine Clearance: 21.1 mL/min (A) (by C-G formula based on SCr of 3.02 mg/dL (H)). Liver Function Tests: Recent Labs  Lab 11/05/18 1934  AST 22  ALT 14  ALKPHOS 37*  BILITOT 0.8  PROT 5.8*  ALBUMIN 3.2*   No results for input(s): LIPASE, AMYLASE in the last 168 hours. No results for input(s): AMMONIA in the last 168 hours. Coagulation Profile: No results for input(s): INR, PROTIME in the last 168 hours. Cardiac Enzymes: Recent Labs  Lab 11/05/18 2224 11/06/18 1112  TROPONINI 0.05* 0.06*   BNP (last 3 results) No results for input(s): PROBNP in the last 8760 hours. HbA1C: No results for input(s): HGBA1C in the last 72 hours. CBG: No results for input(s): GLUCAP in the  last 168 hours. Lipid Profile: No results for input(s): CHOL, HDL, LDLCALC, TRIG, CHOLHDL, LDLDIRECT in the last 72 hours. Thyroid Function Tests: No results for input(s): TSH, T4TOTAL, FREET4, T3FREE, THYROIDAB in the last 72 hours. Anemia Panel: No results for input(s): VITAMINB12, FOLATE, FERRITIN, TIBC, IRON, RETICCTPCT in the last 72 hours. Sepsis Labs: No results for input(s): PROCALCITON, LATICACIDVEN in the last 168 hours.  No results found for this or any previous visit (from the past 240 hour(s)).    Radiology Studies: Ct Chest Wo Contrast  Result Date: 11/08/2018 CLINICAL DATA:  Chest pain and shortness of breath. Pleural effusion. Right thoracentesis 10/09/2018. EXAM: CT CHEST WITHOUT CONTRAST TECHNIQUE: Multidetector CT imaging of the chest was performed following the standard protocol without IV contrast. COMPARISON:  Radiographs today and earlier. Abdominal CT 04/05/2016. No previous chest CT. FINDINGS: Cardiovascular: There is atherosclerosis of the aorta, great vessels and coronary arteries. Left subclavian pacemaker lead extends to the right ventricular apex. The heart is enlarged. There are aortic valvular calcifications. No significant pericardial effusion. Mediastinum/Nodes: There are no enlarged mediastinal, hilar or axillary lymph nodes.Hilar assessment is limited by the lack of intravenous contrast. 10 mm low-density right thyroid lesion on image 11/3 is unlikely to be clinically significant. The trachea and esophagus appear unremarkable. Lungs/Pleura: There is a recurrent moderate size dependent right pleural effusion which has increased since the thoracentesis done 1 month ago. There is trace pleural fluid on the left. The right lower lobe is completely collapsed and consolidated. No discrete endobronchial lesion identified. There is patchy dependent atelectasis elsewhere in the lungs. There are no suspicious nodules within the aerated portions of the lungs. Upper abdomen:  Upper abdominal ascites has improved compared with the previous abdominal CT. The IVC and hepatic veins are distended. There is contour irregularity of the liver, suspicious for underlying cirrhosis. Musculoskeletal/Chest wall: There is no chest wall mass or suspicious osseous finding. Moderate glenohumeral arthropathy bilaterally. IMPRESSION: 1. Recurrence of a moderate-sized pleural effusion following thoracentesis 1 month ago. This pleural effusion is suboptimally characterized without contrast. Correlation with prior thoracentesis results recommended. 2. Complete collapse/consolidation of the right lower lobe without visible endobronchial lesion. Patchy atelectasis elsewhere in the lungs. 3. Cardiomegaly, probable aortic valvular calcifications and Aortic Atherosclerosis (ICD10-I70.0). 4. Probable hepatic  cirrhosis as suggested previously. Electronically Signed   By: Richardean Sale M.D.   On: 11/08/2018 19:16   Dg Chest Port 1 View  Result Date: 11/08/2018 CLINICAL DATA:  Lower chest pain, shortness of breath EXAM: PORTABLE CHEST 1 VIEW COMPARISON:  11/05/2018 FINDINGS: Slight interval decrease in a small to moderate right pleural effusion with associated atelectasis or consolidation. The left lung base is normally aerated. There is mild, diffuse interstitial pulmonary opacity, likely pulmonary edema similar to prior examination, and gross cardiomegaly with left chest single lead pacer. No new airspace opacity. IMPRESSION: Slight interval decrease in a small to moderate right pleural effusion with associated atelectasis or consolidation. The left lung base is normally aerated. There is mild, diffuse interstitial pulmonary opacity, likely pulmonary edema similar to prior examination, and gross cardiomegaly with left chest single lead pacer. No new airspace opacity. Electronically Signed   By: Eddie Candle M.D.   On: 11/08/2018 09:26      LOS: 3 days   Time spent: More than 50% of that time was spent in  counseling and/or coordination of care.  Antonieta Pert, MD [facility]  11/09/2018, 9:40 AM

## 2018-11-09 NOTE — Progress Notes (Signed)
Checked on patient's CPAP.  Water added, patient able to place herself on.

## 2018-11-09 NOTE — Progress Notes (Signed)
[facility] Progress Note   Subjective:   Worsening dyspnea despite 1.5L net neg yesterday.  Plan for pleurex, likely Monday.   Objective Vitals:   11/08/18 1642 11/08/18 2048 11/09/18 0318 11/09/18 0822  BP: 98/72 (!) 105/47 106/74 117/60  Pulse: 79 99 100 98  Resp: 19 18 18 19   Temp: 97.6 F (36.4 C) 98.3 F (36.8 C) 98.1 F (36.7 C) 97.8 F (36.6 C)  TempSrc: Oral Oral Oral Oral  SpO2: 100% 95% 98% 99%  Weight:  112.4 kg    Height:       Physical Exam General: generally fatigued Heart: irreg, syst murmur unchanged Lungs: dec BS bases R to about 2/3 up field, difficulty with complete sentences Abdomen: soft Extremities: 1+ edema with chronic changes  Additional Objective Labs: Basic Metabolic Panel: Recent Labs  Lab 11/07/18 0420 11/08/18 0639 11/09/18 0835  NA 136 138 135  K 3.5 2.5* 3.0*  CL 94* 94* 91*  CO2 29 30 31   GLUCOSE 139* 121* 163*  BUN 179* 158* 146*  CREATININE 3.28* 3.02* 2.99*  CALCIUM 8.8* 8.7* 9.0   Liver Function Tests: Recent Labs  Lab 11/05/18 1934  AST 22  ALT 14  ALKPHOS 37*  BILITOT 0.8  PROT 5.8*  ALBUMIN 3.2*   No results for input(s): LIPASE, AMYLASE in the last 168 hours. CBC: Recent Labs  Lab 11/05/18 1934 11/06/18 1112  11/07/18 1018 11/08/18 0639 11/09/18 0835  WBC 6.1 7.4  --   --  5.4 6.7  NEUTROABS  --   --   --   --   --  5.4  HGB 5.2* 6.2*   < > 7.2* 7.7* 7.7*  HCT 16.8* 19.7*   < > 23.1* 24.8* 25.7*  MCV 100.0 98.0  --   --  92.5 93.8  PLT 151 159  --   --  144* 158   < > = values in this interval not displayed.   Blood Culture    Component Value Date/Time   SDES URINE, RANDOM 10/06/2018 1256   SPECREQUEST NONE 10/06/2018 1256   CULT (A) 10/06/2018 1256    <10,000 COLONIES/mL INSIGNIFICANT GROWTH Performed at [facility] [address], [place], [place] [address]    REPTSTATUS 10/07/2018 FINAL 10/06/2018 1256    Cardiac Enzymes: Recent Labs  Lab 11/05/18 2224  11/06/18 1112  TROPONINI 0.05* 0.06*   CBG: No results for input(s): GLUCAP in the last 168 hours. Iron Studies: No results for input(s): IRON, TIBC, TRANSFERRIN, FERRITIN in the last 72 hours. @lablastinr3 @ Studies/Results: Ct Chest Wo Contrast  Result Date: 11/08/2018 CLINICAL DATA:  Chest pain and shortness of breath. Pleural effusion. Right thoracentesis 10/09/2018. EXAM: CT CHEST WITHOUT CONTRAST TECHNIQUE: Multidetector CT imaging of the chest was performed following the standard protocol without IV contrast. COMPARISON:  Radiographs today and earlier. Abdominal CT 04/05/2016. No previous chest CT. FINDINGS: Cardiovascular: There is atherosclerosis of the aorta, great vessels and coronary arteries. Left subclavian pacemaker lead extends to the right ventricular apex. The heart is enlarged. There are aortic valvular calcifications. No significant pericardial effusion. Mediastinum/Nodes: There are no enlarged mediastinal, hilar or axillary lymph nodes.Hilar assessment is limited by the lack of intravenous contrast. 10 mm low-density right thyroid lesion on image 11/3 is unlikely to be clinically significant. The trachea and esophagus appear unremarkable. Lungs/Pleura: There is a recurrent moderate size dependent right pleural effusion which has increased since the thoracentesis done 1 month ago. There is trace pleural fluid on  the left. The right lower lobe is completely collapsed and consolidated. No discrete endobronchial lesion identified. There is patchy dependent atelectasis elsewhere in the lungs. There are no suspicious nodules within the aerated portions of the lungs. Upper abdomen: Upper abdominal ascites has improved compared with the previous abdominal CT. The IVC and hepatic veins are distended. There is contour irregularity of the liver, suspicious for underlying cirrhosis. Musculoskeletal/Chest wall: There is no chest wall mass or suspicious osseous finding. Moderate glenohumeral  arthropathy bilaterally. IMPRESSION: 1. Recurrence of a moderate-sized pleural effusion following thoracentesis 1 month ago. This pleural effusion is suboptimally characterized without contrast. Correlation with prior thoracentesis results recommended. 2. Complete collapse/consolidation of the right lower lobe without visible endobronchial lesion. Patchy atelectasis elsewhere in the lungs. 3. Cardiomegaly, probable aortic valvular calcifications and Aortic Atherosclerosis (ICD10-I70.0). 4. Probable hepatic cirrhosis as suggested previously. Electronically Signed   By: Richardean Sale M.D.   On: 11/08/2018 19:16   Dg Chest Port 1 View  Result Date: 11/08/2018 CLINICAL DATA:  Lower chest pain, shortness of breath EXAM: PORTABLE CHEST 1 VIEW COMPARISON:  11/05/2018 FINDINGS: Slight interval decrease in a small to moderate right pleural effusion with associated atelectasis or consolidation. The left lung base is normally aerated. There is mild, diffuse interstitial pulmonary opacity, likely pulmonary edema similar to prior examination, and gross cardiomegaly with left chest single lead pacer. No new airspace opacity. IMPRESSION: Slight interval decrease in a small to moderate right pleural effusion with associated atelectasis or consolidation. The left lung base is normally aerated. There is mild, diffuse interstitial pulmonary opacity, likely pulmonary edema similar to prior examination, and gross cardiomegaly with left chest single lead pacer. No new airspace opacity. Electronically Signed   By: Eddie Candle M.D.   On: 11/08/2018 09:26   Medications: . sodium chloride    . ferric gluconate (FERRLECIT/NULECIT) IV 125 mg (11/08/18 1003)  . furosemide    . potassium chloride 10 mEq (11/09/18 1235)   . sodium chloride   Intravenous Once  . allopurinol  300 mg Oral QPM  . calcitRIOL  0.25 mcg Oral QODAY  . isosorbide mononitrate  15 mg Oral QHS  . multivitamin with minerals  1 tablet Oral QHS  .  pantoprazole  40 mg Oral BID  . potassium chloride  40 mEq Oral Q3H  . spironolactone  12.5 mg Oral Daily  . vitamin B-12  500 mcg Oral Daily    Dialysis Orders:  Assessment/Plan:  1.  Anemia:  Presumed GI source.  Transfused and responded.  Discussion re: colonoscopy but as she has declined to date GI no longer following vs tagged scan if bleeds.   Iron deficient - IV iron ordered, the blood transfusions will help too.   2.  CKD 4:  Creatinine within recent baseline currently.  Watch closely.   3.  H/o MR, TR, CHF: cardiology to consult while admitted.  She is near her baseline volume status.  Cardiology treating with lasix 120 IV today then back to torsemide, also aldactone 12.5 while in hospital.  Supplementing K.   4.  Atrial fibrillation with AV node ablated and PP  5.  Pleural effusion: pleurex v thoracentesis. Hopefully will give her some symptomatic relief. I suspect this is the etiology of her worsening dyspnea.   Jannifer Hick MD 11/09/2018, 12:53 PM  [facility] Pager: [phone]

## 2018-11-10 LAB — COMPREHENSIVE METABOLIC PANEL
ALT: 14 U/L (ref 0–44)
AST: 19 U/L (ref 15–41)
Albumin: 2.8 g/dL — ABNORMAL LOW (ref 3.5–5.0)
Alkaline Phosphatase: 41 U/L (ref 38–126)
Anion gap: 13 (ref 5–15)
BUN: 136 mg/dL — ABNORMAL HIGH (ref 8–23)
CO2: 31 mmol/L (ref 22–32)
Calcium: 8.5 mg/dL — ABNORMAL LOW (ref 8.9–10.3)
Chloride: 92 mmol/L — ABNORMAL LOW (ref 98–111)
Creatinine, Ser: 3.04 mg/dL — ABNORMAL HIGH (ref 0.44–1.00)
GFR calc Af Amer: 17 mL/min — ABNORMAL LOW (ref >60)
GFR calc non Af Amer: 15 mL/min — ABNORMAL LOW (ref >60)
Glucose, Bld: 120 mg/dL — ABNORMAL HIGH (ref 70–99)
Potassium: 3.3 mmol/L — ABNORMAL LOW (ref 3.5–5.1)
Sodium: 136 mmol/L (ref 135–145)
Total Bilirubin: 1.1 mg/dL (ref 0.3–1.2)
Total Protein: 5.6 g/dL — ABNORMAL LOW (ref 6.5–8.1)

## 2018-11-10 LAB — PREPARE RBC (CROSSMATCH)

## 2018-11-10 LAB — BLOOD GAS, ARTERIAL
Acid-Base Excess: 8.4 mmol/L — ABNORMAL HIGH (ref 0.0–2.0)
Bicarbonate: 32 mmol/L — ABNORMAL HIGH (ref 20.0–28.0)
Drawn by: 301361
O2 Saturation: 96 %
Patient temperature: 98
pCO2 arterial: 40.5 mmHg (ref 32.0–48.0)
pH, Arterial: 7.507 — ABNORMAL HIGH (ref 7.350–7.450)
pO2, Arterial: 69.5 mmHg — ABNORMAL LOW (ref 83.0–108.0)

## 2018-11-10 LAB — CBC
HCT: 23.8 % — ABNORMAL LOW (ref 36.0–46.0)
Hemoglobin: 7.2 g/dL — ABNORMAL LOW (ref 12.0–15.0)
MCH: 28.6 pg (ref 26.0–34.0)
MCHC: 30.3 g/dL (ref 30.0–36.0)
MCV: 94.4 fL (ref 80.0–100.0)
Platelets: 147 10*3/uL — ABNORMAL LOW (ref 150–400)
RBC: 2.52 MIL/uL — ABNORMAL LOW (ref 3.87–5.11)
RDW: 20.4 % — ABNORMAL HIGH (ref 11.5–15.5)
WBC: 6.3 10*3/uL (ref 4.0–10.5)
nRBC: 0 % (ref 0.0–0.2)

## 2018-11-10 LAB — TSH: TSH: 2.98 u[IU]/mL (ref 0.350–4.500)

## 2018-11-10 LAB — PROTIME-INR
INR: 1.19
Prothrombin Time: 15 seconds (ref 11.4–15.2)

## 2018-11-10 LAB — APTT: aPTT: 35 seconds (ref 24–36)

## 2018-11-10 MED ORDER — FUROSEMIDE 10 MG/ML IJ SOLN
120.0000 mg | Freq: Once | INTRAVENOUS | Status: AC
  Administered 2018-11-10: 120 mg via INTRAVENOUS
  Filled 2018-11-10: qty 10

## 2018-11-10 MED ORDER — VANCOMYCIN HCL 10 G IV SOLR
1500.0000 mg | INTRAVENOUS | Status: DC
  Filled 2018-11-10: qty 1500

## 2018-11-10 MED ORDER — POTASSIUM CHLORIDE CRYS ER 20 MEQ PO TBCR
40.0000 meq | EXTENDED_RELEASE_TABLET | ORAL | Status: AC
  Administered 2018-11-10 (×2): 40 meq via ORAL
  Filled 2018-11-10 (×2): qty 2

## 2018-11-10 MED ORDER — TORSEMIDE 20 MG PO TABS: 80.0000 mg | ORAL_TABLET | Freq: Two times a day (BID) | ORAL | Status: DC

## 2018-11-10 MED ORDER — TORSEMIDE 20 MG PO TABS
80.0000 mg | ORAL_TABLET | Freq: Two times a day (BID) | ORAL | Status: DC
  Administered 2018-11-10: 80 mg via ORAL
  Filled 2018-11-10: qty 4

## 2018-11-10 NOTE — Progress Notes (Signed)
[facility] Progress Note   Subjective:   Dyspnea worse than baseline, stable compared to yesterday. I/Os 1.0 / 1.7 yesterday, net neg 5.8 for the admission.  Plan for pleurex, likely tomorrow.   Objective Vitals:   11/09/18 1805 11/09/18 2103 11/10/18 0457 11/10/18 0716  BP: 115/61 (!) 111/55 (!) 101/58 115/73  Pulse: 97 97 (!) 102 98  Resp: 18 (!) 22 16 18   Temp: 98.1 F (36.7 C) 97.9 F (36.6 C) 98.4 F (36.9 C) 98 F (36.7 C)  TempSrc: Oral Oral  Oral  SpO2: 97% 96% 97% 97%  Weight:  112.2 kg    Height:       Physical Exam General: generally fatigued Heart: irreg, syst murmur unchanged Lungs: dec BS bases R to about 2/3 up field, difficulty with complete sentences Abdomen: soft Extremities: 1+ edema with chronic changes  Additional Objective Labs: Basic Metabolic Panel: Recent Labs  Lab 11/09/18 0835 11/09/18 1650 11/10/18 0404  NA 135 135 136  K 3.0* 3.3* 3.3*  CL 91* 92* 92*  CO2 31 32 31  GLUCOSE 163* 141* 120*  BUN 146* 140* 136*  CREATININE 2.99* 3.04* 3.04*  CALCIUM 9.0 8.8* 8.5*   Liver Function Tests: Recent Labs  Lab 11/05/18 1934 11/10/18 0404  AST 22 19  ALT 14 14  ALKPHOS 37* 41  BILITOT 0.8 1.1  PROT 5.8* 5.6*  ALBUMIN 3.2* 2.8*   No results for input(s): LIPASE, AMYLASE in the last 168 hours. CBC: Recent Labs  Lab 11/05/18 1934 11/06/18 1112  11/08/18 0639 11/09/18 0835 11/10/18 0404  WBC 6.1 7.4  --  5.4 6.7 6.3  NEUTROABS  --   --   --   --  5.4  --   HGB 5.2* 6.2*   < > 7.7* 7.7* 7.2*  HCT 16.8* 19.7*   < > 24.8* 25.7* 23.8*  MCV 100.0 98.0  --  92.5 93.8 94.4  PLT 151 159  --  144* 158 147*   < > = values in this interval not displayed.   Blood Culture    Component Value Date/Time   SDES URINE, RANDOM 10/06/2018 1256   SPECREQUEST NONE 10/06/2018 1256   CULT (A) 10/06/2018 1256    <10,000 COLONIES/mL INSIGNIFICANT GROWTH Performed at [facility] [address], [place], [place] [address]     REPTSTATUS 10/07/2018 FINAL 10/06/2018 1256    Cardiac Enzymes: Recent Labs  Lab 11/05/18 2224 11/06/18 1112  TROPONINI 0.05* 0.06*   CBG: No results for input(s): GLUCAP in the last 168 hours. Iron Studies: No results for input(s): IRON, TIBC, TRANSFERRIN, FERRITIN in the last 72 hours. @lablastinr3 @ Studies/Results: Ct Chest Wo Contrast  Result Date: 11/08/2018 CLINICAL DATA:  Chest pain and shortness of breath. Pleural effusion. Right thoracentesis 10/09/2018. EXAM: CT CHEST WITHOUT CONTRAST TECHNIQUE: Multidetector CT imaging of the chest was performed following the standard protocol without IV contrast. COMPARISON:  Radiographs today and earlier. Abdominal CT 04/05/2016. No previous chest CT. FINDINGS: Cardiovascular: There is atherosclerosis of the aorta, great vessels and coronary arteries. Left subclavian pacemaker lead extends to the right ventricular apex. The heart is enlarged. There are aortic valvular calcifications. No significant pericardial effusion. Mediastinum/Nodes: There are no enlarged mediastinal, hilar or axillary lymph nodes.Hilar assessment is limited by the lack of intravenous contrast. 10 mm low-density right thyroid lesion on image 11/3 is unlikely to be clinically significant. The trachea and esophagus appear unremarkable. Lungs/Pleura: There is a recurrent moderate size dependent right pleural  effusion which has increased since the thoracentesis done 1 month ago. There is trace pleural fluid on the left. The right lower lobe is completely collapsed and consolidated. No discrete endobronchial lesion identified. There is patchy dependent atelectasis elsewhere in the lungs. There are no suspicious nodules within the aerated portions of the lungs. Upper abdomen: Upper abdominal ascites has improved compared with the previous abdominal CT. The IVC and hepatic veins are distended. There is contour irregularity of the liver, suspicious for underlying cirrhosis.  Musculoskeletal/Chest wall: There is no chest wall mass or suspicious osseous finding. Moderate glenohumeral arthropathy bilaterally. IMPRESSION: 1. Recurrence of a moderate-sized pleural effusion following thoracentesis 1 month ago. This pleural effusion is suboptimally characterized without contrast. Correlation with prior thoracentesis results recommended. 2. Complete collapse/consolidation of the right lower lobe without visible endobronchial lesion. Patchy atelectasis elsewhere in the lungs. 3. Cardiomegaly, probable aortic valvular calcifications and Aortic Atherosclerosis (ICD10-I70.0). 4. Probable hepatic cirrhosis as suggested previously. Electronically Signed   By: Richardean Sale M.D.   On: 11/08/2018 19:16   Medications: . sodium chloride    . ferric gluconate (FERRLECIT/NULECIT) IV 125 mg (11/09/18 1343)  . vancomycin     . sodium chloride   Intravenous Once  . allopurinol  300 mg Oral QPM  . calcitRIOL  0.25 mcg Oral QODAY  . isosorbide mononitrate  15 mg Oral QHS  . multivitamin with minerals  1 tablet Oral QHS  . pantoprazole  40 mg Oral BID  . potassium chloride  40 mEq Oral Q3H  . spironolactone  12.5 mg Oral Daily  . vitamin B-12  500 mcg Oral Daily    Dialysis Orders:  Assessment/Plan:  1.  Anemia:  Presumed GI source.  Transfused and responded but Hb down to 7.2 this AM and another unit being prepared.  Discussion re: colonoscopy but as she has declined to date GI no longer following vs tagged scan if bleeds.   Iron deficient - IV iron ordered, the blood transfusions will help too.   2.  CKD 4:  Creatinine within recent baseline currently.  Watch closely.   3.  H/o MR, TR, CHF: cardiology to consult while admitted.  She is near her baseline volume status.  S/p lasix 120 IV yesterday, now back to home torsemide 80 BID, also aldactone 12.5 while in hospital.  At home take metolazone PRN with good effect.  Supplementing K.   4.  Atrial fibrillation with AV node  ablated and PP  5.  Pleural effusion: pleurex v thoracentesis. Hopefully will give her some symptomatic relief. I suspect this is the etiology of her worsening dyspnea.   Jannifer Hick MD 11/10/2018, 9:34 AM  [facility] Pager: [phone]

## 2018-11-10 NOTE — Anesthesia Preprocedure Evaluation (Addendum)
Anesthesia Evaluation  Patient identified by MRN, date of birth, ID band Patient awake    Reviewed: Allergy & Precautions, NPO status , Patient's Chart, lab work & pertinent test results  Airway Mallampati: II  TM Distance: >3 FB     Dental   Pulmonary sleep apnea , former smoker,  History noted. CG   breath sounds clear to auscultation       Cardiovascular hypertension, +CHF   Rhythm:Regular Rate:Normal     Neuro/Psych    GI/Hepatic GERD  ,(+) Hepatitis -  Endo/Other    Renal/GU Renal disease     Musculoskeletal   Abdominal   Peds  Hematology   Anesthesia Other Findings   Reproductive/Obstetrics                            Anesthesia Physical Anesthesia Plan  ASA: IV  Anesthesia Plan: MAC   Post-op Pain Management:    Induction: Intravenous  PONV Risk Score and Plan: 2 and Ondansetron and Midazolam  Airway Management Planned: Nasal Cannula and Simple Face Mask  Additional Equipment:   Intra-op Plan:   Post-operative Plan:   Informed Consent: I have reviewed the patients History and Physical, chart, labs and discussed the procedure including the risks, benefits and alternatives for the proposed anesthesia with the patient or authorized representative who has indicated his/her understanding and acceptance.     Dental advisory given  Plan Discussed with: Anesthesiologist, CRNA and Surgeon  Anesthesia Plan Comments:       Anesthesia Quick Evaluation

## 2018-11-10 NOTE — Progress Notes (Signed)
PROGRESS NOTE    [name] [name]  MCR:[id number] DOB: 14-Feb-1946 DOA: 11/05/2018 PCP: [name], MD   Brief Narrative: HPI as per [name]: 73 y.o. 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  Patient reports having fatigue and dyspnea for the past 2 days.  Denies having any hematemesis, hematochezia, or melena.  Denies having any abdominal pain.  States her gastroenterologist is Dr. Chartered loss adjuster in [place].  Reports having history of angina but states she has been having intermittent substernal chest pressure at rest lasting 30 to 40 seconds for the past few days. Daughter at bedside states patient takes torsemide and metolazone for her lower extremity edema. Patient was admitted, seen by nephrology cardiology and gastroenterology.  Patient was reluctant for further colonoscopy and GI has signed off.  Subjective: Patient complains of constipation, getting tapwater enema.  Also on CPAP. Lasix 120 mg iv last night for shortness of breath  Assessment & Plan:   Symptomatic anemia: Suspecting in the setting of anemia of chronic disease/renal disease , possible slow gi bleed. status pos total 3 unit, HB at 7.7 gm->7/2 gm now amd 1 more unit  PRBC ordered this am by cardio. Also got iv  ferric gluconate 125 mg x4 doses. Having constipation this morning, tapwater enema placed has black tarry stool/ BP stable. had recent extensive work-up including EGD and capsule endoscopy recently. Stool heme positive in the ER.  Seen by GI and patient has refused colonoscopy.Cont Protonix twice daily. Check serial h/h.  Again discussed Dr. Havery Moros from GI this am-he states that unless patient is  willing to have colonoscopy there not much GI can offer,she has refused colonoscopy twice when he discussed with her.  I again brought this topic with her. She is going to discuss with her daughter. Recent Labs  Lab 11/07/18 0420 11/07/18 1018 11/08/18 0639 11/09/18 0835 11/10/18 0404  HGB 7.0* 7.2* 7.7* 7.7* 7.2*  HCT 22.2* 23.1* 24.8* 25.7* 23.8*   Acute on chronic chronic combined systolic and diastolic CHF with pleural effusion:   Last lvef is  30% in January 2020. has chronic appearing leg edema with chronic hyperpigmentation-somewhat worse from baseline and was on diuretics torsemide 80 twice daily, Aldactone. got 120 mg Lasix IV last night and torsemide was held. Cardiology following.  Recurrent Right-sided Pleural effusion:Patient is being planned for CT surgery evaluation for Pleurx catheter likely on Monday but if more symptomatic will need early. Cont diuresis per cards.  Chest pain, likely in the setting of severe anemia.Troponin is at 0.05- 0.06 and flat.  EKG was paced rhythm.  Currently is following closely.We will try to keep up with hemoglobin. Unable to use antiplatelet due to her anemia, continue Imdur.  Cardiology is following.  CKD stage IV, baseline creatinine 2.8- creatinine seem abt the baseline in low 3s.Patient is known to [facility] and is being followed closely.  Recent Labs  Lab 11/07/18 0420 11/08/18 0639 11/09/18 0835 11/09/18 1650 11/10/18 0404  CREATININE 3.28* 3.02* 2.99* 3.04* 3.04*    Hypokalemia: still low and being  PO.Aldactone  Started 2/14 . Recent Labs  Lab 11/07/18 0420 11/08/18 0639 11/09/18 0835 11/09/18 1650 11/10/18 0404  K 3.5 2.5* 3.0* 3.3* 3.3*   PAF: s/p post AV nodal ablation and PPM.  Chronic RV pacing in the 90s.  She has refused anticoagulation.  OSA on CPAP nightly  Hx of SA node dysfunction with pacemaker in place.  DVT prophylaxis: SCD Code Status: Patient has been changed to DNR/DNI. Family Communication:I  have updated pt's daughter over the phone previously. No family at bedside. Disposition Plan: Remains inpatient pending clinical improvement.   Consultants:  Gastroenterology consulted. Cardiology Nephrology  Procedures:  Antimicrobials: Anti-infectives (From admission, onward)   Start     Dose/Rate Route Frequency Ordered Stop   11/10/18 0830  vancomycin (VANCOCIN) 1,500 mg in sodium chloride 0.9 % 500 mL IVPB     1,500 mg 250 mL/hr over 120 Minutes Intravenous On call to O.R. 11/10/18 0806 11/11/18 0559       Objective: Vitals:   11/09/18 1805 11/09/18 2103 11/10/18 0457 11/10/18 0716  BP: 115/61 (!) 111/55 (!) 101/58 115/73  Pulse: 97 97 (!) 102 98  Resp: 18 (!) 22 16 18   Temp: 98.1 F (36.7 C) 97.9 F (36.6 C) 98.4 F (36.9 C) 98 F (36.7 C)  TempSrc: Oral Oral  Oral  SpO2: 97% 96% 97% 97%  Weight:  112.2 kg    Height:        Intake/Output Summary (Last 24 hours) at 11/10/2018 1127 Last data filed at 11/10/2018 0900 Gross per 24 hour  Intake 900 ml  Output 2125 ml  Net -1225 ml   Filed Weights   11/06/18 0441 11/08/18 2048 11/09/18 2103  Weight: 111 kg 112.4 kg 112.2 kg   Weight change: -0.2 kg  Body mass index is 39.92 kg/m.  Intake/Output from previous day: 02/15 0701 - 02/16 0700 In: 1020 [P.O.:1020] Out: 1725 [Urine:1725] Intake/Output this shift: Total I/O In: 240 [P.O.:240] Out: 700 [Urine:700]  Examination:  General exam: short of breath on cpap, having constipation.   HEENT:Oral mucosa moist, Ear/Nose WNL grossly, dentition normal. Respiratory system: Diminished breath sounds on the right lobe, no use of accessory muscle, non tender on palpation. Cardiovascular system: regular rate and rhythm, S1 & S2 heard, No JVD/murmurs. Gastrointestinal system: Abdomen soft, non-tender, non-distended, BS +. No hepatosplenomegaly palpable. Nervous System:Alert, awake and oriented at baseline. Able to move UE and LE, sensation intact. Extremities: b/l  leg edema++, distal peripheral pulses palpable.  Skin: No rashes,no icterus. MSK: Normal muscle bulk,tone, power  Medications:  Scheduled Meds: . sodium chloride   Intravenous Once  . allopurinol  300 mg Oral QPM  . calcitRIOL  0.25 mcg Oral QODAY  . isosorbide mononitrate  15 mg Oral QHS  . multivitamin with minerals  1 tablet Oral QHS  . pantoprazole  40 mg Oral BID  . potassium chloride  40 mEq Oral Q3H  . spironolactone  12.5 mg Oral Daily  . torsemide  80 mg Oral BID  . vitamin B-12  500 mcg Oral Daily   Continuous Infusions: . sodium chloride    . ferric gluconate (FERRLECIT/NULECIT) IV 125 mg (11/10/18 1036)  . vancomycin      Data Reviewed: I have personally reviewed following labs and imaging studies  CBC: Recent Labs  Lab 11/05/18 1934 11/06/18 1112 11/07/18 0420 11/07/18 1018 11/08/18 0639 11/09/18 0835 11/10/18 0404  WBC 6.1 7.4  --   --  5.4 6.7 6.3  NEUTROABS  --   --   --   --   --  5.4  --   HGB 5.2* 6.2* 7.0* 7.2* 7.7* 7.7* 7.2*  HCT 16.8* 19.7* 22.2* 23.1* 24.8* 25.7* 23.8*  MCV 100.0  98.0  --   --  92.5 93.8 94.4  PLT 151 159  --   --  144* 158 846*   Basic Metabolic Panel: Recent Labs  Lab 11/05/18 2224  11/07/18 0420 11/08/18 0639 11/08/18 0829 11/09/18 0835 11/09/18 1650 11/10/18 0404  NA  --    < > 136 138  --  135 135 136  K  --    < > 3.5 2.5*  --  3.0* 3.3* 3.3*  CL  --    < > 94* 94*  --  91* 92* 92*  CO2  --    < > 29 30  --  31 32 31  GLUCOSE  --    < > 139* 121*  --  163* 141* 120*  BUN  --    < > 179* 158*  --  146* 140* 136*  CREATININE  --    < > 3.28* 3.02*  --  2.99* 3.04* 3.04*  CALCIUM  --    < > 8.8* 8.7*  --  9.0 8.8* 8.5*  MG 3.0*  --   --   --  3.0*  --   --   --    < > = values in this interval not displayed.   GFR: Estimated Creatinine Clearance: 20.9 mL/min (A) (by C-G formula based on SCr of 3.04 mg/dL (H)). Liver Function Tests: Recent Labs  Lab 11/05/18 1934 11/10/18 0404  AST 22 19  ALT 14 14    ALKPHOS 37* 41  BILITOT 0.8 1.1  PROT 5.8* 5.6*  ALBUMIN 3.2* 2.8*   No results for input(s): LIPASE, AMYLASE in the last 168 hours. No results for input(s): AMMONIA in the last 168 hours. Coagulation Profile: Recent Labs  Lab 11/10/18 0404  INR 1.19   Cardiac Enzymes: Recent Labs  Lab 11/05/18 2224 11/06/18 1112  TROPONINI 0.05* 0.06*   BNP (last 3 results) No results for input(s): PROBNP in the last 8760 hours. HbA1C: No results for input(s): HGBA1C in the last 72 hours. CBG: No results for input(s): GLUCAP in the last 168 hours. Lipid Profile: No results for input(s): CHOL, HDL, LDLCALC, TRIG, CHOLHDL, LDLDIRECT in the last 72 hours. Thyroid Function Tests: Recent Labs    11/10/18 0404  TSH 2.980   Anemia Panel: No results for input(s): VITAMINB12, FOLATE, FERRITIN, TIBC, IRON, RETICCTPCT in the last 72 hours. Sepsis Labs: No results for input(s): PROCALCITON, LATICACIDVEN in the last 168 hours.  Recent Results (from the past 240 hour(s))  Surgical pcr screen     Status: None   Collection Time: 11/09/18 12:44 PM  Result Value Ref Range Status   MRSA, PCR NEGATIVE NEGATIVE Final   Staphylococcus aureus NEGATIVE NEGATIVE Final    Comment: (NOTE) The Xpert SA Assay (FDA approved for NASAL specimens in patients 65 years of age and older), is one component of a comprehensive surveillance program. It is not intended to diagnose infection nor to guide or monitor treatment. Performed at [facility], [place] [address] [place], [place] [address]       Radiology Studies: Ct Chest Wo Contrast  Result Date: 11/08/2018 CLINICAL DATA:  Chest pain and shortness of breath. Pleural effusion. Right thoracentesis 10/09/2018. EXAM: CT CHEST WITHOUT CONTRAST TECHNIQUE: Multidetector CT imaging of the chest was performed following the standard protocol without IV contrast. COMPARISON:  Radiographs today and earlier. Abdominal CT 04/05/2016. No previous chest CT.  FINDINGS: Cardiovascular: There is atherosclerosis of the aorta, great vessels and coronary  arteries. Left subclavian pacemaker lead extends to the right ventricular apex. The heart is enlarged. There are aortic valvular calcifications. No significant pericardial effusion. Mediastinum/Nodes: There are no enlarged mediastinal, hilar or axillary lymph nodes.Hilar assessment is limited by the lack of intravenous contrast. 10 mm low-density right thyroid lesion on image 11/3 is unlikely to be clinically significant. The trachea and esophagus appear unremarkable. Lungs/Pleura: There is a recurrent moderate size dependent right pleural effusion which has increased since the thoracentesis done 1 month ago. There is trace pleural fluid on the left. The right lower lobe is completely collapsed and consolidated. No discrete endobronchial lesion identified. There is patchy dependent atelectasis elsewhere in the lungs. There are no suspicious nodules within the aerated portions of the lungs. Upper abdomen: Upper abdominal ascites has improved compared with the previous abdominal CT. The IVC and hepatic veins are distended. There is contour irregularity of the liver, suspicious for underlying cirrhosis. Musculoskeletal/Chest wall: There is no chest wall mass or suspicious osseous finding. Moderate glenohumeral arthropathy bilaterally. IMPRESSION: 1. Recurrence of a moderate-sized pleural effusion following thoracentesis 1 month ago. This pleural effusion is suboptimally characterized without contrast. Correlation with prior thoracentesis results recommended. 2. Complete collapse/consolidation of the right lower lobe without visible endobronchial lesion. Patchy atelectasis elsewhere in the lungs. 3. Cardiomegaly, probable aortic valvular calcifications and Aortic Atherosclerosis (ICD10-I70.0). 4. Probable hepatic cirrhosis as suggested previously. Electronically Signed   By: Richardean Sale M.D.   On: 11/08/2018 19:16       LOS: 4 days   Time spent: More than 50% of that time was spent in counseling and/or coordination of care.  Antonieta Pert, MD [facility]  11/10/2018, 11:27 AM

## 2018-11-10 NOTE — Progress Notes (Signed)
Patient ID: [name] [name], female   DOB: 08/24/1946, 73 y.o.   MRN: [id number]     Advanced Heart Failure Rounding Note  PCP-Cardiologist: Dorris Carnes, MD   Subjective:    Admitted with hgb 5.2 and chest pain. Transfused total of 3 units PRBCs. Hgb 7.2, mildly lower.  Getting IV Fe currently.   BUN/creatinine stably elevated.     Bipap on currently.  Still short of breath.    Objective:   Weight Range: 112.2 kg Body mass index is 39.92 kg/m.   Vital Signs:   Temp:  [97.9 F (36.6 C)-98.4 F (36.9 C)] 98 F (36.7 C) (02/16 0716) Pulse Rate:  [97-102] 98 (02/16 0716) Resp:  [16-22] 18 (02/16 0716) BP: (101-115)/(55-73) 115/73 (02/16 0716) SpO2:  [96 %-97 %] 97 % (02/16 0716) Weight:  [112.2 kg] 112.2 kg (02/15 2103) Last BM Date: 11/09/18  Weight change: Filed Weights   11/06/18 0441 11/08/18 2048 11/09/18 2103  Weight: 111 kg 112.4 kg 112.2 kg    Intake/Output:   Intake/Output Summary (Last 24 hours) at 11/10/2018 1256 Last data filed at 11/10/2018 1231 Gross per 24 hour  Intake 1230 ml  Output 1925 ml  Net -695 ml      Physical Exam    General: NAD Neck: JVP difficult but appears elevated, no thyromegaly or thyroid nodule.  Lungs: Distant BS CV: Nonpalpable PMI.  Heart regular S1/S2, no S3/S4, 1/6 HSM LLSB.  1+ ankle edema.   Abdomen: Soft, nontender, no hepatosplenomegaly, no distention.  Skin: Intact without lesions or rashes.  Neurologic: Alert and oriented x 3.  Psych: Normal affect. Extremities: No clubbing or cyanosis.  HEENT: Normal.    Telemetry   V paced 90s with underlying atrial fibrillation (personally reviewed)  EKG    none  Labs    CBC Recent Labs    11/09/18 0835 11/10/18 0404  WBC 6.7 6.3  NEUTROABS 5.4  --   HGB 7.7* 7.2*  HCT 25.7* 23.8*  MCV 93.8 94.4  PLT 158 025*   Basic Metabolic Panel Recent Labs    11/08/18 0829  11/09/18 1650 11/10/18 0404  NA  --    < > 135 136  K  --    < > 3.3* 3.3*  CL  --    < >  92* 92*  CO2  --    < > 32 31  GLUCOSE  --    < > 141* 120*  BUN  --    < > 140* 136*  CREATININE  --    < > 3.04* 3.04*  CALCIUM  --    < > 8.8* 8.5*  MG 3.0*  --   --   --    < > = values in this interval not displayed.   Liver Function Tests Recent Labs    11/10/18 0404  AST 19  ALT 14  ALKPHOS 41  BILITOT 1.1  PROT 5.6*  ALBUMIN 2.8*   No results for input(s): LIPASE, AMYLASE in the last 72 hours. Cardiac Enzymes No results for input(s): CKTOTAL, CKMB, CKMBINDEX, TROPONINI in the last 72 hours.  BNP: BNP (last 3 results) Recent Labs    06/20/18 2249 10/06/18 1309 11/05/18 2224  BNP 160.6* 214.8* 228.9*    ProBNP (last 3 results) No results for input(s): PROBNP in the last 8760 hours.   D-Dimer No results for input(s): DDIMER in the last 72 hours. Hemoglobin A1C No results for input(s): HGBA1C in the last 72 hours. Fasting  Lipid Panel No results for input(s): CHOL, HDL, LDLCALC, TRIG, CHOLHDL, LDLDIRECT in the last 72 hours. Thyroid Function Tests Recent Labs    11/10/18 0404  TSH 2.980    Other results:   Imaging    No results found.   Medications:     Scheduled Medications: . sodium chloride   Intravenous Once  . allopurinol  300 mg Oral QPM  . calcitRIOL  0.25 mcg Oral QODAY  . isosorbide mononitrate  15 mg Oral QHS  . multivitamin with minerals  1 tablet Oral QHS  . pantoprazole  40 mg Oral BID  . spironolactone  12.5 mg Oral Daily  . [START ON 11/11/2018] torsemide  80 mg Oral BID  . vitamin B-12  500 mcg Oral Daily    Infusions: . sodium chloride    . furosemide    . vancomycin      PRN Medications: docusate sodium, HYDROcodone-acetaminophen, methocarbamol    Patient Profile   73 y/o woman well known to me from HF Clinic. She has multiple medical problems including endstage biventricular HF, severe MR/TR, CKD IV, chronic anemia with recurrent GI bleeding. Recently admitted wit refractory HF, anemia, AKI and R pleural  effusion which was tapped. EGD and capsule study negative.  Now readmitted with hgb 5.2 and CP. No overt bleeding.  Assessment/Plan   1. Chest Pain, in the setting of anemia.   - Resolved. Troponin 0.05>0.6.  - Doubt ACS - Not candidate for cath with AKI and bleeding  2. Symptomatic Anemia  - Hgb 5.2 on admit. Overall transfused 3U PRBCs. Hgb 7.2 today, mildly lower compared to yesterday.  She is getting IV Fe today. Transfuse < 7 given volume overload.  - GI consulted. On PPI   - EGD and capsule negative 1/20 - She says she would be willing to have colonoscopy but would stabilize her more clinically before undertaking procedure (Pleurx, further diuresis given significant dyspnea).   - Could consider tagged RBC scan if hgb dropping again.    3. A/C combined systolic/diastolic heart failure  - ECHO 06/21/2018 EF 40-45% LV severely dilated.  Mild LVH, akinesis of mid-apicalanteroseptal and apical myocardium, mild AS, severe MR, LA and RA severely dilated, moderate TR, PA peak pressure 42 mmHg. [place] 10/19 showed preserved cardiac output with markedly elevated right and left heart filling pressures (very prominent RV failure). TEE looked like restrictive cardiomyopathy. Repeat echo in 1/20 with EF 30-35% (lower), wall motion abnormalities, moderate MR, moderately dilated RV, moderate TR, PASP 59 mmHg.  Cause of cardiomyopathy uncertain, chronic RV pacing may play a role.  - She still looks volume overloaded and short of breath.  Will again replace afternoon torsemide dose with Lasix 120 mg IV x 1.   4. Pleural Effusion, Right - Noted large right pleural effusion on CXR.  - Dr. Prescott Gum to see, plan for Pleurx catheter placement apparently on Monday.    5. CKD Stage IV - Creatinine baseline ~2.5  - Today's creatinine stable at 3 with very high but stable BUN. Follow BMET daily.   6. A fib S/P AV nodal ablation and PPM  - Chronic RV pacing in the 90s - She has refused anticoagulation in the  past and now also has chronic GI bleeding.   7. Hypokalemia - K 3.3. Replace K.  - added spironolactone while in house only.  7. DNR/DNI    Length of Stay: [place], [place]  11/10/2018, 12:56 PM  Advanced Heart Failure Team Pager  [phone] (M-F; 7a - 4p)  Please contact [facility] for night-coverage after hours (4p -7a ) and weekends on [email or web address]

## 2018-11-10 NOTE — Progress Notes (Signed)
Pt found asleep in bed with home cpap unit in use.

## 2018-11-11 ENCOUNTER — Inpatient Hospital Stay (HOSPITAL_COMMUNITY): Payer: Medicare Other | Admitting: Anesthesiology

## 2018-11-11 ENCOUNTER — Inpatient Hospital Stay (HOSPITAL_COMMUNITY): Payer: Medicare Other

## 2018-11-11 ENCOUNTER — Encounter (HOSPITAL_COMMUNITY): Payer: Self-pay | Admitting: Certified Registered Nurse Anesthetist

## 2018-11-11 ENCOUNTER — Encounter (HOSPITAL_COMMUNITY)
Admission: EM | Disposition: A | Discharge status: Skilled Nursing Facility | Payer: Self-pay | Source: Home / Self Care | Attending: Internal Medicine

## 2018-11-11 DIAGNOSIS — R195 Other fecal abnormalities: Secondary | ICD-10-CM

## 2018-11-11 HISTORY — PX: CHEST TUBE INSERTION: SHX231

## 2018-11-11 LAB — BASIC METABOLIC PANEL
Anion gap: 12 (ref 5–15)
BUN: 123 mg/dL — ABNORMAL HIGH (ref 8–23)
CO2: 27 mmol/L (ref 22–32)
Calcium: 8.7 mg/dL — ABNORMAL LOW (ref 8.9–10.3)
Chloride: 97 mmol/L — ABNORMAL LOW (ref 98–111)
Creatinine, Ser: 2.95 mg/dL — ABNORMAL HIGH (ref 0.44–1.00)
GFR calc Af Amer: 18 mL/min — ABNORMAL LOW (ref >60)
GFR calc non Af Amer: 15 mL/min — ABNORMAL LOW (ref >60)
Glucose, Bld: 126 mg/dL — ABNORMAL HIGH (ref 70–99)
Potassium: 3.6 mmol/L (ref 3.5–5.1)
Sodium: 136 mmol/L (ref 135–145)

## 2018-11-11 LAB — CBC
HCT: 27.4 % — ABNORMAL LOW (ref 36.0–46.0)
Hemoglobin: 8.2 g/dL — ABNORMAL LOW (ref 12.0–15.0)
MCH: 28.3 pg (ref 26.0–34.0)
MCHC: 29.9 g/dL — ABNORMAL LOW (ref 30.0–36.0)
MCV: 94.5 fL (ref 80.0–100.0)
Platelets: 151 10*3/uL (ref 150–400)
RBC: 2.9 MIL/uL — ABNORMAL LOW (ref 3.87–5.11)
RDW: 20 % — ABNORMAL HIGH (ref 11.5–15.5)
WBC: 7.4 10*3/uL (ref 4.0–10.5)
nRBC: 0 % (ref 0.0–0.2)

## 2018-11-11 SURGERY — INSERTION, PLEURAL DRAINAGE CATHETER
Anesthesia: Monitor Anesthesia Care | Site: Chest | Laterality: Right

## 2018-11-11 MED ORDER — LIDOCAINE 2% (20 MG/ML) 5 ML SYRINGE
INTRAMUSCULAR | Status: AC
  Filled 2018-11-11: qty 5

## 2018-11-11 MED ORDER — ONDANSETRON HCL 4 MG/2ML IJ SOLN
INTRAMUSCULAR | Status: AC
  Filled 2018-11-11: qty 2

## 2018-11-11 MED ORDER — MIDAZOLAM HCL 2 MG/2ML IJ SOLN
INTRAMUSCULAR | Status: AC
  Filled 2018-11-11: qty 2

## 2018-11-11 MED ORDER — FENTANYL CITRATE (PF) 250 MCG/5ML IJ SOLN
INTRAMUSCULAR | Status: AC
  Filled 2018-11-11: qty 5

## 2018-11-11 MED ORDER — BISACODYL 5 MG PO TBEC
10.0000 mg | DELAYED_RELEASE_TABLET | Freq: Every day | ORAL | Status: DC
  Administered 2018-11-12 – 2018-11-18 (×6): 10 mg via ORAL
  Filled 2018-11-11 (×6): qty 2

## 2018-11-11 MED ORDER — PROPOFOL 500 MG/50ML IV EMUL
INTRAVENOUS | Status: DC | PRN
  Administered 2018-11-11: 50 ug/kg/min via INTRAVENOUS

## 2018-11-11 MED ORDER — ONDANSETRON HCL 4 MG/2ML IJ SOLN
INTRAMUSCULAR | Status: DC | PRN
  Administered 2018-11-11: 4 mg via INTRAVENOUS

## 2018-11-11 MED ORDER — FUROSEMIDE 10 MG/ML IJ SOLN
120.0000 mg | Freq: Once | INTRAVENOUS | Status: AC
  Administered 2018-11-11: 120 mg via INTRAVENOUS
  Filled 2018-11-11: qty 2

## 2018-11-11 MED ORDER — SODIUM CHLORIDE 0.45 % IV SOLN
INTRAVENOUS | Status: DC
  Administered 2018-11-11: 17:00:00 via INTRAVENOUS

## 2018-11-11 MED ORDER — ACETAMINOPHEN 500 MG PO TABS
1000.0000 mg | ORAL_TABLET | Freq: Four times a day (QID) | ORAL | Status: AC
  Administered 2018-11-11 – 2018-11-14 (×2): 1000 mg via ORAL
  Filled 2018-11-11 (×7): qty 2

## 2018-11-11 MED ORDER — LIDOCAINE HCL (PF) 1 % IJ SOLN
INTRAMUSCULAR | Status: DC | PRN
  Administered 2018-11-11: 8 mL

## 2018-11-11 MED ORDER — ACETAMINOPHEN 160 MG/5ML PO SOLN
1000.0000 mg | Freq: Four times a day (QID) | ORAL | Status: AC
  Filled 2018-11-11: qty 40.6

## 2018-11-11 MED ORDER — SODIUM CHLORIDE 0.9 % IV SOLN
INTRAVENOUS | Status: DC | PRN
  Administered 2018-11-11: 25 ug/min via INTRAVENOUS

## 2018-11-11 MED ORDER — SENNOSIDES-DOCUSATE SODIUM 8.6-50 MG PO TABS
1.0000 | ORAL_TABLET | Freq: Every day | ORAL | Status: DC
  Administered 2018-11-11 – 2018-11-17 (×5): 1 via ORAL
  Filled 2018-11-11 (×7): qty 1

## 2018-11-11 MED ORDER — PROPOFOL 1000 MG/100ML IV EMUL
INTRAVENOUS | Status: AC
  Filled 2018-11-11: qty 100

## 2018-11-11 MED ORDER — LIDOCAINE HCL (PF) 1 % IJ SOLN
INTRAMUSCULAR | Status: AC
  Filled 2018-11-11: qty 30

## 2018-11-11 MED ORDER — ONDANSETRON HCL 4 MG/2ML IJ SOLN
4.0000 mg | Freq: Four times a day (QID) | INTRAMUSCULAR | Status: DC | PRN
  Administered 2018-11-14: 4 mg via INTRAVENOUS
  Filled 2018-11-11: qty 2

## 2018-11-11 MED ORDER — 0.9 % SODIUM CHLORIDE (POUR BTL) OPTIME
TOPICAL | Status: DC | PRN
  Administered 2018-11-11: 1000 mL

## 2018-11-11 MED ORDER — LACTATED RINGERS IV SOLN
INTRAVENOUS | Status: DC | PRN
  Administered 2018-11-11: 07:00:00 via INTRAVENOUS

## 2018-11-11 MED ORDER — VANCOMYCIN HCL 1000 MG IV SOLR
INTRAVENOUS | Status: DC | PRN
  Administered 2018-11-11: 1500 mg via INTRAVENOUS

## 2018-11-11 MED ORDER — LIDOCAINE HCL (CARDIAC) PF 100 MG/5ML IV SOSY
PREFILLED_SYRINGE | INTRAVENOUS | Status: DC | PRN
  Administered 2018-11-11: 60 mg via INTRATRACHEAL

## 2018-11-11 MED ORDER — PROPOFOL 10 MG/ML IV BOLUS
INTRAVENOUS | Status: AC
  Filled 2018-11-11: qty 20

## 2018-11-11 MED ORDER — DEXAMETHASONE SODIUM PHOSPHATE 10 MG/ML IJ SOLN
INTRAMUSCULAR | Status: AC
  Filled 2018-11-11: qty 1

## 2018-11-11 SURGICAL SUPPLY — 34 items
ADH SKN CLS APL DERMABOND .7 (GAUZE/BANDAGES/DRESSINGS) ×1
CANISTER SUCT 3000ML PPV (MISCELLANEOUS) ×3 IMPLANT
COVER SURGICAL LIGHT HANDLE (MISCELLANEOUS) ×3 IMPLANT
COVER WAND RF STERILE (DRAPES) ×3 IMPLANT
DERMABOND ADVANCED (GAUZE/BANDAGES/DRESSINGS) ×2
DERMABOND ADVANCED .7 DNX12 (GAUZE/BANDAGES/DRESSINGS) ×1 IMPLANT
DRAPE C-ARM 42X72 X-RAY (DRAPES) ×3 IMPLANT
DRAPE LAPAROSCOPIC ABDOMINAL (DRAPES) ×3 IMPLANT
DRSG TEGADERM 4X4.75 (GAUZE/BANDAGES/DRESSINGS) ×2 IMPLANT
DRSG VERSA FOAM LRG 10X15 (GAUZE/BANDAGES/DRESSINGS) ×2 IMPLANT
ELECT REM PT RETURN 9FT ADLT (ELECTROSURGICAL) ×3
ELECTRODE REM PT RTRN 9FT ADLT (ELECTROSURGICAL) ×1 IMPLANT
GAUZE SPONGE 4X4 12PLY STRL (GAUZE/BANDAGES/DRESSINGS) ×2 IMPLANT
GLOVE BIO SURGEON STRL SZ7.5 (GLOVE) ×6 IMPLANT
GOWN STRL REUS W/ TWL LRG LVL3 (GOWN DISPOSABLE) ×2 IMPLANT
GOWN STRL REUS W/TWL LRG LVL3 (GOWN DISPOSABLE) ×6
KIT BASIN OR (CUSTOM PROCEDURE TRAY) ×3 IMPLANT
KIT PLEURX DRAIN CATH 1000ML (MISCELLANEOUS) ×3 IMPLANT
KIT PLEURX DRAIN CATH 15.5FR (DRAIN) ×3 IMPLANT
KIT TURNOVER KIT B (KITS) ×3 IMPLANT
NDL HYPO 25GX1X1/2 BEV (NEEDLE) ×1 IMPLANT
NEEDLE HYPO 25GX1X1/2 BEV (NEEDLE) ×3 IMPLANT
NS IRRIG 1000ML POUR BTL (IV SOLUTION) ×3 IMPLANT
PACK GENERAL/GYN (CUSTOM PROCEDURE TRAY) ×3 IMPLANT
PAD ARMBOARD 7.5X6 YLW CONV (MISCELLANEOUS) ×6 IMPLANT
SET DRAINAGE LINE (MISCELLANEOUS) IMPLANT
SUT ETHILON 3 0 PS 1 (SUTURE) ×3 IMPLANT
SUT SILK 2 0 SH (SUTURE) ×3 IMPLANT
SUT VIC AB 3-0 SH 8-18 (SUTURE) ×3 IMPLANT
SYR CONTROL 10ML LL (SYRINGE) ×3 IMPLANT
TOWEL GREEN STERILE (TOWEL DISPOSABLE) ×3 IMPLANT
TOWEL GREEN STERILE FF (TOWEL DISPOSABLE) ×3 IMPLANT
VALVE REPLACEMENT CAP (MISCELLANEOUS) IMPLANT
WATER STERILE IRR 1000ML POUR (IV SOLUTION) ×3 IMPLANT

## 2018-11-11 NOTE — Brief Op Note (Signed)
11/11/2018  9:07 AM  PATIENT:  [name] [name]  73 y.o. female  PRE-OPERATIVE DIAGNOSIS:  RIGHT PLEURAL EFFUSION  POST-OPERATIVE DIAGNOSIS:  RIGHT PLEURAL EFFUSION  PROCEDURE:  Procedure(s): INSERTION PLEURAL DRAINAGE CATHETER (Right) Drainage 1.5 L right pleural fluid   SURGEON:  Surgeon(s) and Role:    Ivin Poot, MD - Primary  PHYSICIAN ASSISTANT:   ASSISTANTS: none   ANESTHESIA:   local and IV sedation  EBL:  10 mL   BLOOD ADMINISTERED:none  DRAINS: none   LOCAL MEDICATIONS USED:  LIDOCAINE  and Amount: 8 ml  SPECIMEN:  No Specimen  DISPOSITION OF SPECIMEN:  N/A  COUNTS:  YES  TOURNIQUET:  * No tourniquets in log *  DICTATION: .Dragon Dictation  PLAN OF CARE: return to 3 E  PATIENT DISPOSITION:  PACU - hemodynamically stable.   Delay start of Pharmacological VTE agent (>24hrs) due to surgical blood loss or risk of bleeding: yes

## 2018-11-11 NOTE — Anesthesia Postprocedure Evaluation (Signed)
Anesthesia Post Note  Patient: [name] [name]  Procedure(s) Performed: INSERTION PLEURAL DRAINAGE CATHETER (Right Chest)     Patient location during evaluation: PACU Anesthesia Type: MAC Level of consciousness: awake Pain management: pain level controlled Respiratory status: spontaneous breathing Cardiovascular status: stable Postop Assessment: no apparent nausea or vomiting Anesthetic complications: no    Last Vitals:  Vitals:   11/11/18 0915 11/11/18 0945  BP: (!) 99/51 (!) 103/57  Pulse: (!) 102 99  Resp: (!) 21 (!) 22  Temp: 36.8 C   SpO2: 100% 93%    Last Pain:  Vitals:   11/10/18 2122  TempSrc: Oral  PainSc:                  Bonetta Mostek

## 2018-11-11 NOTE — Progress Notes (Signed)
PT Cancellation Note  Patient Details Name: FANCY DUNKLEY MRN: [id number] DOB: Oct 06, 1945   Cancelled Treatment:    Reason Eval/Treat Not Completed: Pain limiting ability to participate Pt reports she is in increased pain and wants to attempt mobility later in afternoon. Will reattempt as schedule allows.   Leighton Ruff, PT, DPT  Acute Rehabilitation Services  Pager: [phone] Office: [phone]    Rudean Hitt 11/11/2018, 12:10 PM

## 2018-11-11 NOTE — Transfer of Care (Signed)
Immediate Anesthesia Transfer of Care Note  Patient: [name] [name]  Procedure(s) Performed: INSERTION PLEURAL DRAINAGE CATHETER (Right Chest)  Patient Location: PACU  Anesthesia Type:MAC  Level of Consciousness: awake, alert  and oriented  Airway & Oxygen Therapy: Patient Spontanous Breathing and Patient connected to nasal cannula oxygen (pt transitioned from bipap by Clarise Cruz, RT upon arrival to PACU)  Post-op Assessment: Report given to RN and Post -op Vital signs reviewed and stable  Post vital signs: Reviewed and stable  Last Vitals:  Vitals Value Taken Time  BP    Temp    Pulse 103 11/11/2018  9:12 AM  Resp 15 11/11/2018  9:12 AM  SpO2 100 % 11/11/2018  9:12 AM  Vitals shown include unvalidated device data.  Last Pain:  Vitals:   11/10/18 2122  TempSrc: Oral  PainSc:       Patients Stated Pain Goal: 0 (16/10/96 0454)  Complications: No apparent anesthesia complications

## 2018-11-11 NOTE — Progress Notes (Signed)
Called to OR for bipap.  Pt placed on her reported settings 19/13, rate 10, fio2 60% for procedure per CRNA request.  No apparent bipap complications noted during procedure and Dr Prescott Gum requested pt be transported to PACU on bipap and then can trial off bipap once in PACU.  In PACU pt is awake and talking, pt placed on 2 lpm  per RN, sat 100%, no distress noted, BBSH clear diminished.  VSS.

## 2018-11-11 NOTE — Progress Notes (Addendum)
Patient ID: [name] [name], female   DOB: 1945/10/05, 73 y.o.   MRN: [id number]     Advanced Heart Failure Rounding Note  PCP-Cardiologist: Dorris Carnes, MD   Subjective:    Admitted with hgb 5.2 and chest pain.   Taken for R Pleurx cath this am with 1.5 L drawn off. Breathing much better. Having pain and soreness in her R arm, worse over the weekend. Appears to be infiltrated.  Very tender to the touch.   Hgb 8.2 this am. Cr 3.04 -> 2.95  Objective:   Weight Range: 112 kg Body mass index is 39.85 kg/m.   Vital Signs:   Temp:  [98.1 F (36.7 C)-98.4 F (36.9 C)] 98.4 F (36.9 C) (02/17 0455) Pulse Rate:  [95-100] 95 (02/17 0455) Resp:  [16-20] 20 (02/17 0455) BP: (96-113)/(58-65) 96/61 (02/17 0455) SpO2:  [94 %-100 %] 97 % (02/17 0455) Weight:  [505 kg] 112 kg (02/16 2122) Last BM Date: 11/09/18  Weight change: Filed Weights   11/08/18 2048 11/09/18 2103 11/10/18 2122  Weight: 112.4 kg 112.2 kg 112 kg   Intake/Output:   Intake/Output Summary (Last 24 hours) at 11/11/2018 0901 Last data filed at 11/11/2018 0901 Gross per 24 hour  Intake 1140 ml  Output 3160 ml  Net -2020 ml    Physical Exam    General:NAD HEENT: Normal Neck: Supple. JVP difficult, but appears elevated to the jaw. Carotids 2+ bilat; no bruits. No thyromegaly or nodule noted. Cor: PMI nondisplaced. RRR, 1/6 HSM LLSB.  Lungs: CTAB, normal effort. Abdomen: Soft, non-tender, non-distended, no HSM. No bruits or masses. +BS  Extremities: No cyanosis or clubbing. 1+ ankle edema. RUE with marked tenderness and trace to 1+ edema. No evidence of skin breakdown at this time.  Neuro: Alert & orientedx3, cranial nerves grossly intact. moves all 4 extremities w/o difficulty. Affect pleasant   Telemetry   V paced 90s with underlying atrial fibrillation, personally reviewed.  EKG    None  Labs    CBC Recent Labs    11/09/18 0835 11/10/18 0404 11/11/18 0020  WBC 6.7 6.3 7.4  NEUTROABS 5.4  --    --   HGB 7.7* 7.2* 8.2*  HCT 25.7* 23.8* 27.4*  MCV 93.8 94.4 94.5  PLT 158 147* 397   Basic Metabolic Panel Recent Labs    11/10/18 0404 11/11/18 0020  NA 136 136  K 3.3* 3.6  CL 92* 97*  CO2 31 27  GLUCOSE 120* 126*  BUN 136* 123*  CREATININE 3.04* 2.95*  CALCIUM 8.5* 8.7*   Liver Function Tests Recent Labs    11/10/18 0404  AST 19  ALT 14  ALKPHOS 41  BILITOT 1.1  PROT 5.6*  ALBUMIN 2.8*   No results for input(s): LIPASE, AMYLASE in the last 72 hours. Cardiac Enzymes No results for input(s): CKTOTAL, CKMB, CKMBINDEX, TROPONINI in the last 72 hours.  BNP: BNP (last 3 results) Recent Labs    06/20/18 2249 10/06/18 1309 11/05/18 2224  BNP 160.6* 214.8* 228.9*    ProBNP (last 3 results) No results for input(s): PROBNP in the last 8760 hours.   D-Dimer No results for input(s): DDIMER in the last 72 hours. Hemoglobin A1C No results for input(s): HGBA1C in the last 72 hours. Fasting Lipid Panel No results for input(s): CHOL, HDL, LDLCALC, TRIG, CHOLHDL, LDLDIRECT in the last 72 hours. Thyroid Function Tests Recent Labs    11/10/18 0404  TSH 2.980    Other results:   Imaging  Dg C-arm 1-60 Min-no Report  Result Date: 11/11/2018 Fluoroscopy was utilized by the requesting physician.  No radiographic interpretation.     Medications:     Scheduled Medications: . [MAR Hold] sodium chloride   Intravenous Once  . [MAR Hold] allopurinol  300 mg Oral QPM  . [MAR Hold] calcitRIOL  0.25 mcg Oral QODAY  . [MAR Hold] isosorbide mononitrate  15 mg Oral QHS  . [MAR Hold] multivitamin with minerals  1 tablet Oral QHS  . [MAR Hold] pantoprazole  40 mg Oral BID  . [MAR Hold] spironolactone  12.5 mg Oral Daily  . [MAR Hold] torsemide  80 mg Oral BID  . [MAR Hold] vitamin B-12  500 mcg Oral Daily    Infusions: . [MAR Hold] sodium chloride      PRN Medications: 0.9 % irrigation (POUR BTL), [MAR Hold] docusate sodium, [MAR Hold]  HYDROcodone-acetaminophen, lidocaine (PF), [MAR Hold] methocarbamol   Patient Profile   73 y/o woman well known to me from HF Clinic. She has multiple medical problems including endstage biventricular HF, severe MR/TR, CKD IV, chronic anemia with recurrent GI bleeding. Recently admitted wit refractory HF, anemia, AKI and R pleural effusion which was tapped. EGD and capsule study negative.  Now readmitted with hgb 5.2 and CP. No overt bleeding.  Assessment/Plan   1. Chest Pain, in the setting of anemia.   - Resolved. Troponin 0.05>0.6.  - No further.  - Not candidate for cath with AKI and bleeding  2. Symptomatic Anemia  - Hgb 5.2 on admit. Overall transfused 3U PRBCs. Hgb 7.2 today, mildly lower compared to yesterday.  She is getting IV Fe today. Transfuse < 7 given volume overload.  - GI consulted. On PPI   - EGD and capsule negative 1/20 - Hgb 8.2 this am.  - She says she would be willing to have colonoscopy but would stabilize her more clinically before undertaking procedure (Pleurx, further diuresis given significant dyspnea).   - Could consider tagged RBC scan if hgb dropping again.    3. A/C combined systolic/diastolic heart failure  - ECHO 06/21/2018 EF 40-45% LV severely dilated.  Mild LVH, akinesis of mid-apicalanteroseptal and apical myocardium, mild AS, severe MR, LA and RA severely dilated, moderate TR, PA peak pressure 42 mmHg. [place] 10/19 showed preserved cardiac output with markedly elevated right and left heart filling pressures (very prominent RV failure). TEE looked like restrictive cardiomyopathy. Repeat echo in 1/20 with EF 30-35% (lower), wall motion abnormalities, moderate MR, moderately dilated RV, moderate TR, PASP 59 mmHg.  Cause of cardiomyopathy uncertain, chronic RV pacing may play a role.  - She remains volume overloaded on exam, complicated somewhat by her Moderate TR. Repeat IV lasix 120 mg IV x 1 and follow response.   4. Pleural Effusion, Right - Noted large  right pleural effusion on CXR.  - s/p R pleurx cath this am by Dr. Prescott Gum with 1.5 L out.   5. CKD Stage IV - Creatinine baseline ~2.5  - Cr stable around 3 but with very high but stable BUN at 123. Follow BMET daily.   6. A fib S/P AV nodal ablation and PPM  - Chronic RV pacing in the 90s. - She has refused anticoagulation in the past and now also has chronic GI bleeding.  - No change.   7. Hypokalemia - K 3.6. Replace K.  - Added spironolactone while in house only. (Follow closely with Cr > 2.5.) Will not discharge on.   8. DNR/DNI  9. RUE IV infiltrate - Elevate with warm compress. No skin breakdown currently.   Volume status remains elevated. Now with RUE IV infiltrate. Symptomatic treatment.   Length of Stay: [place], [place]  11/11/2018, 9:01 AM  Advanced Heart Failure Team Pager [phone] (M-F; 7a - 4p)  Please contact [facility] for night-coverage after hours (4p -7a ) and weekends on [email or web address]  Patient seen and examined with the above-signed Advanced Practice Provider and/or Housestaff. I personally reviewed laboratory data, imaging studies and relevant notes. I independently examined the patient and formulated the important aspects of the plan. I have edited the note to reflect any of my changes or salient points. I have personally discussed the plan with the patient and/or family.  She remains tenuous. Breathing better after Pleurex placed with 1.5L off. However remains dyspneic and volume overloaded. Agree with repeating lasix 120 IV today. Watch renal function closely. Supp K. Hgb stable 8.2. Agree with warm compresses and elevation for IV infiltrate.   Glori Bickers, MD  5:22 PM

## 2018-11-11 NOTE — Progress Notes (Addendum)
[facility] Progress Note  CC:  Anemia   Subjective: No abdominal pain. Passed a large hard dark stool 11/10/2018 after enema. C/O right chest wall pain s/p right Pleurx catheter placement earlier this morning secondary to a recurrent right pleural effusion.  In review, patient is a 73 yo female with a history of anemia, paroxysmal atrial fibrillation, CHF, S/P pacemaker placement, HTN, GERD, OSA on Cpap, CKD, liver cirrhosis secondary to NASH and morbid obesity. Admitted to the hospital 11/05/2018 with chest pain and anemia. Initial GI consult for anemia  was completed 11/06/2018. A colonoscopy was recommended at that time, however, the patient was not interested in proceeding with a colonoscopy. Her H/H dropped and she received a 4th PRBC transfusion 11/10/2018. She has decided to proceed with a colonoscopy with continued anemia of unknown etiology.   09/26/2018 EGD Identified antral erythema with erosions. Biopsy of duodenum showed no sprue or pathology.  Gastric biopsies showed slight, chronic inflammation, no H. Pylori.  10/11/2018 capsule endoscopy.  Showed rapid transit through the gastric lumen but no evidence of any bleeding.  Small bowel normal.  No source for bleeding identified.  Latest colonoscopy 1998 showed a diminutive rectal polyp, small internal hemorrhoids, mild sigmoid diverticulosis. Unable to locate pathology report on the polyp.  Objective:  Vital signs in last 24 hours: Temp:  [98.1 F (36.7 C)-98.6 F (37 C)] 98.6 F (37 C) (02/17 1000) Pulse Rate:  [95-102] 101 (02/17 1000) Resp:  [16-35] 21 (02/17 1000) BP: (93-113)/(51-65) 93/55 (02/17 1000) SpO2:  [93 %-100 %] 94 % (02/17 1000) Weight:  [779 kg] 112 kg (02/16 2122) Last BM Date: 11/11/18 General:   Alert, using CPAP, in NAD Heart:  RRR,  3/6 pansystolic murmur. Pulm:  Decreased RLL otherwise clear throughout. Right chest wall Pleurx drg dry and intact. Pacemaker left subclavian  palpated. Abdomen:  Soft, nondistended, tenderness to umbilical hernia which protrudes, no rebound or guarding, + BS x 4 quads.  Extremities:  Bil. Lower extremities with 1 + edema, stasis dermatitis.  Neurologic:  Alert and  oriented x4;  grossly normal neurologically. Psych:  Alert and cooperative. Normal mood and affect.  Intake/Output from previous day: 02/16 0701 - 02/17 0700 In: 1080 [P.O.:690; I.V.:25; Blood:315; IV Piggyback:50] Out: 2350 [Urine:2350] Intake/Output this shift: Total I/O In: 300 [I.V.:300] Out: 1510 [Other:1500; Blood:10]  Lab Results: Recent Labs    11/09/18 0835 11/10/18 0404 11/11/18 0020  WBC 6.7 6.3 7.4  HGB 7.7* 7.2* 8.2*  HCT 25.7* 23.8* 27.4*  PLT 158 147* 151   BMET Recent Labs    11/09/18 1650 11/10/18 0404 11/11/18 0020  NA 135 136 136  K 3.3* 3.3* 3.6  CL 92* 92* 97*  CO2 32 31 27  GLUCOSE 141* 120* 126*  BUN 140* 136* 123*  CREATININE 3.04* 3.04* 2.95*  CALCIUM 8.8* 8.5* 8.7*   LFT Recent Labs    11/10/18 0404  PROT 5.6*  ALBUMIN 2.8*  AST 19  ALT 14  ALKPHOS 41  BILITOT 1.1   PT/INR Recent Labs    11/10/18 0404  LABPROT 15.0  INR 1.19   Hepatitis Panel No results for input(s): HEPBSAG, HCVAB, HEPAIGM, HEPBIGM in the last 72 hours.  Dg Chest Port 1 View  Result Date: 11/11/2018 CLINICAL DATA:  Right-sided PleurX catheter drain placed per EXAM: PORTABLE CHEST 1 VIEW COMPARISON:  11/08/2018 and chest CT 11/08/2018 FINDINGS: Left-sided pacemaker unchanged. Right-sided PleurX drainage catheter in place. Evidence of small right pleural effusion with  significant interval improvement. Left lung is clear. Stable cardiomegaly. Remainder of the exam is unchanged. IMPRESSION: Interval improvement in right pleural effusion with small residual effusion. Right PleurX drainage catheter in place. Stable cardiomegaly. Electronically Signed   By: Marin Olp M.D.   On: 11/11/2018 11:17   Dg C-arm 1-60 Min-no Report  Result Date:  11/11/2018 Fluoroscopy was utilized by the requesting physician.  No radiographic interpretation.    Assessment / Plan:  1) Anemia acute on chronic in setting of Heme positive stool and  chronic renal disease. Prior EGD and small bowel capsule endoscopy do not explain her anemia. Colonoscopy recommended. Patient previously reluctant to proceed with a colonoscopy. Continued drop in H/H required a 4th PRBC transfusion 11/10/2018. Hg 7.2 up to 8.2. HCT 23.8 up to 27.4 post transfusion. Patient wishes to proceed with a colonoscopy at this time. -continue to monitor H/H closely -Dr. Fuller Plan to review   2) Cirrhosis secondary to NASH with stable LFTs. Albumin 2.8. PLT 151. INR 1.19. RUQ abdominal sonogram ordered, await results -followed by GI Dr. Oneida Alar in [place]  3) Recurrent right pleural effusion requiring placement of  Pleuryx drain 11/11/2018 for intermittent thoracentesis,  1.5 L drained this am.  4) AKI on CKD: BUN 123. Cr. 2.95  5) Chest pain/CHF with EF 30% 09/2018. Troponin wnl. EKG paced rhythm -cardiology following    LOS: 5 days   Noralyn Pick  11/11/2018, 1:20 PM       Attending Physician Note   I have taken an interval history, reviewed the chart and examined the patient. I agree with the Advanced Practitioner's note, impression and recommendations. Her chronic illnesses make sedation and colonoscopy very high risk so I do not recommend colonoscopy unless she makes substantial improvement. She can review option of colonoscopy with her primary GI, Dr. Oneida Alar, as outpatient. We can reconsider as inpatient if she has overt, active GI bleeding. GI signing off.   Lucio Edward, MD FACG [phone]

## 2018-11-11 NOTE — Op Note (Signed)
NAME: [name] [name], [facility] MEDICAL RECORD [id number] ACCOUNT [id number] DATE OF BIRTH:24-Feb-1946 FACILITY: [facility] LOCATION: [facility] PHYSICIAN:PETER VAN TRIGT III, MD  OPERATIVE REPORT  DATE OF PROCEDURE:  11/11/2018  OPERATION:  Placement of right PleurX catheter.  SURGEON:  Tharon Aquas Trigt III, MD  PREOPERATIVE DIAGNOSIS:  Recurrent right pleural effusion with underlying heart failure, renal failure, hepatic cirrhosis.  POSTOPERATIVE DIAGNOSIS:  Recurrent right pleural effusion with underlying heart failure, renal failure, hepatic cirrhosis.  ANESTHESIA:  Monitored IV sedation with local 1% lidocaine.  DESCRIPTION OF PROCEDURE:  The patient was brought from preop holding where informed consent was documented, the proper site marked, and all final issues addressed.  The patient was brought to the operating room and placed semi-supine on the operating  room table.  She complained of anxiety and shortness of breath, and BiPAP was administered by the anesthesia team as well as low-dose propofol.  The patient calmed and remained stable with good oxygen saturation.  The right chest was prepped and draped  as a sterile field.  A proper time-out was performed.  Lidocaine was infiltrated in the anterior axillary line beneath the right breast crease.  A second small area was injected with 1% lidocaine at the midclavicular line at the costal margin.  Two small  incisions were made in these anesthetized the areas.  Through the upper incision, a guidewire was placed in the right pleural space using the Seldinger technique and confirmed by C-arm fluoroscopy.  Next, the PleurX catheter was tunneled from the lower  incision to the upper incision.  Next, over the guidewire the dilator then dilator sheath were inserted in the pleural space.  The dilator was removed and the tear-away sheath was removed as the PleurX catheter was removed into the pleural space.  There  was exit of clear fluid.  C-arm  fluoroscopy confirmed the PleurX catheter to be dependent and posterior in the right pleural space.  The PleurX catheter was then drained for 1.5 L of clear fluid.  The 2 incisions were closed in a standard fashion  securing the catheter to the skin, and a sterile dressing was applied.  The patient returned to the recovery room in stable condition.  LN/NUANCE  D:11/11/2018 T:11/11/2018 JOB:[id number]

## 2018-11-11 NOTE — Evaluation (Signed)
Physical Therapy Evaluation Patient Details Name: [name] [name] MRN: [id number] DOB: 07-10-1946 Today's Date: 11/11/2018   History of Present Illness  Pt is a 73 y/o female admitted secondary to symptomatic anemia secondary to possible GI bleed. Pt initially refusing colonoscopy, however, now agreeable. Pt also found to have R pleural effusion and is s/p R pleurx drain on 11/11/2018. PMH includes CHF, HTN, sleep apnea, a fib, s/p pacemaker.   Clinical Impression  Pt admitted secondary to problem above with deficits below. Pt requiring max encouragement to participate with therapy, and eventually agreeable to sitting at EOB. Pt requiring supervision to min A for bed mobility tasks this session. Oxygen sats >90% on RA throughout mobility. Per pt, reports she is feeling weaker and is in increased pain from procedure. Feel pt would benefit from SNF level therapies at d/c to increase independence and safety with mobility. Will continue to follow acutely to maximize functional mobility independence and safety.     Follow Up Recommendations SNF;Supervision for mobility/OOB    Equipment Recommendations  None recommended by PT    Recommendations for Other Services       Precautions / Restrictions Precautions Precautions: Fall Restrictions Weight Bearing Restrictions: No Other Position/Activity Restrictions: Per Dr. Prescott Gum, pt only to dangle at EOB on 2/17 following surgery.       Mobility  Bed Mobility Overal bed mobility: Needs Assistance Bed Mobility: Supine to Sit;Sit to Supine     Supine to sit: Supervision;HOB elevated Sit to supine: Min assist   General bed mobility comments: Increased time to performed bed mobility tasks and heavy use of LUE on bed rail. Pt ignoring cues to use log roll technique and insistent on performing bed mobility how she does at home. Required min A for LE assist to return to supine. Some SOB noted, however, oxygen sats >90% on RA.   Transfers                  General transfer comment: Deferred as MD only wanting pt to dangle at EOB day of surgery.   Ambulation/Gait                Stairs            Wheelchair Mobility    Modified Rankin (Stroke Patients Only)       Balance Overall balance assessment: Needs assistance Sitting-balance support: No upper extremity supported;Feet supported Sitting balance-Leahy Scale: Good                                       Pertinent Vitals/Pain Pain Assessment: Faces Faces Pain Scale: Hurts whole lot Pain Location: R pleurx incision site Pain Descriptors / Indicators: Operative site guarding;Grimacing;Guarding Pain Intervention(s): Limited activity within patient's tolerance;Monitored during session;Repositioned    Home Living Family/patient expects to be discharged to:: Private residence Living Arrangements: Children Available Help at Discharge: Family;Available PRN/intermittently Type of Home: House Home Access: Level entry     Home Layout: Two level;Able to live on main level with bedroom/bathroom Home Equipment: Tub bench;Walker - 2 wheels;Bedside commode;Cane - single point;Electric scooter;Hospital bed;Wheelchair - manual      Prior Function Level of Independence: Independent with assistive device(s)         Comments: Reports using WC and electric scooter for mobility tasks. Reports independence with ADL tasks.      Hand Dominance  Extremity/Trunk Assessment   Upper Extremity Assessment Upper Extremity Assessment: Defer to OT evaluation    Lower Extremity Assessment Lower Extremity Assessment: Generalized weakness    Cervical / Trunk Assessment Cervical / Trunk Assessment: Kyphotic  Communication   Communication: No difficulties  Cognition Arousal/Alertness: Awake/alert Behavior During Therapy: WFL for tasks assessed/performed Overall Cognitive Status: No family/caregiver present to determine baseline cognitive  functioning                                 General Comments: Pt somewhat annoyed with having to perform mobility tasks, however, eventually agreeable to sitting EOB.       General Comments General comments (skin integrity, edema, etc.): Pt reporting increased weakness and reports she plans to go SNF at d/c.     Exercises     Assessment/Plan    PT Assessment Patient needs continued PT services  PT Problem List Decreased strength;Cardiopulmonary status limiting activity;Decreased activity tolerance;Decreased balance;Decreased mobility;Decreased knowledge of use of DME;Decreased knowledge of precautions;Pain       PT Treatment Interventions Gait training;DME instruction;Therapeutic activities;Functional mobility training;Therapeutic exercise;Balance training;Patient/family education    PT Goals (Current goals can be found in the Care Plan section)  Acute Rehab PT Goals Patient Stated Goal: to decrease pain  PT Goal Formulation: With patient Time For Goal Achievement: 11/25/18 Potential to Achieve Goals: Good    Frequency Min 2X/week   Barriers to discharge Decreased caregiver support      Co-evaluation               AM-PAC PT "6 Clicks" Mobility  Outcome Measure Help needed turning from your back to your side while in a flat bed without using bedrails?: A Lot Help needed moving from lying on your back to sitting on the side of a flat bed without using bedrails?: A Lot Help needed moving to and from a bed to a chair (including a wheelchair)?: A Lot Help needed standing up from a chair using your arms (e.g., wheelchair or bedside chair)?: A Lot Help needed to walk in hospital room?: A Lot Help needed climbing 3-5 steps with a railing? : A Lot 6 Click Score: 12    End of Session   Activity Tolerance: Patient tolerated treatment well Patient left: in bed;with call bell/phone within reach;with bed alarm set Nurse Communication: Mobility status PT Visit  Diagnosis: Unsteadiness on feet (R26.81);Muscle weakness (generalized) (M62.81);Pain Pain - Right/Left: Right Pain - part of body: (chest at incision site)    Time: 1416-1440 PT Time Calculation (min) (ACUTE ONLY): 24 min   Charges:   PT Evaluation $PT Eval Moderate Complexity: 1 Mod PT Treatments $Therapeutic Activity: 8-22 mins        Leighton Ruff, PT, DPT  Acute Rehabilitation Services  Pager: [phone] Office: [phone]   Rudean Hitt 11/11/2018, 3:10 PM

## 2018-11-11 NOTE — Progress Notes (Signed)
Pre Procedure note for inpatients:   [name] [name] has been scheduled for Procedure(s): INSERTION PLEURAL DRAINAGE CATHETER (Right) today. The various methods of treatment have been discussed with the patient. After consideration of the risks, benefits and treatment options the patient has consented to the planned procedure.   The patient has been seen and labs reviewed. There are no changes in the patient's condition to prevent proceeding with the planned procedure today.  Recent labs:  Lab Results  Component Value Date   WBC 7.4 11/11/2018   HGB 8.2 (L) 11/11/2018   HCT 27.4 (L) 11/11/2018   PLT 151 11/11/2018   GLUCOSE 126 (H) 11/11/2018   CHOL 114 06/21/2018   TRIG 48 06/21/2018   HDL 47 06/21/2018   LDLCALC 57 06/21/2018   ALT 14 11/10/2018   AST 19 11/10/2018   NA 136 11/11/2018   K 3.6 11/11/2018   CL 97 (L) 11/11/2018   CREATININE 2.95 (H) 11/11/2018   BUN 123 (H) 11/11/2018   CO2 27 11/11/2018   TSH 2.980 11/10/2018   INR 1.19 11/10/2018   HGBA1C 5.5 06/21/2018    Ivin Poot III, MD 11/11/2018 7:01 AM

## 2018-11-11 NOTE — Anesthesia Postprocedure Evaluation (Signed)
Anesthesia Post Note  Patient: [name] [name]  Procedure(s) Performed: INSERTION PLEURAL DRAINAGE CATHETER (Right Chest)     Patient location during evaluation: PACU Anesthesia Type: MAC Level of consciousness: awake Pain management: pain level controlled Vital Signs Assessment: post-procedure vital signs reviewed and stable Respiratory status: spontaneous breathing Cardiovascular status: stable Postop Assessment: no apparent nausea or vomiting Anesthetic complications: no    Last Vitals:  Vitals:   11/11/18 0945 11/11/18 1000  BP: (!) 103/57 (!) 93/55  Pulse: 99 (!) 101  Resp: (!) 22 (!) 21  Temp:  37 C  SpO2: 93% 94%    Last Pain:  Vitals:   11/11/18 1149  TempSrc:   PainSc: 6                  Gilbert Manolis

## 2018-11-11 NOTE — Progress Notes (Signed)
[facility] Progress Note   Subjective:     S/p pleural drain this AM, 1.5L removed   Stable SCr, K 3.6  Breathing is improved  Objective Vitals:   11/11/18 0815 11/11/18 0915 11/11/18 0945 11/11/18 1000  BP:  (!) 99/51 (!) 103/57 (!) 93/55  Pulse: 96 (!) 102 99 (!) 101  Resp: (!) 35 (!) 21 (!) 22 (!) 21  Temp:  98.2 F (36.8 C)  98.6 F (37 C)  TempSrc:      SpO2: 100% 100% 93% 94%  Weight:      Height:       Physical Exam General: generally fatigued Heart: irreg, syst murmur unchanged Lungs: dec BS bases R to about 2/3 up field, difficulty with complete sentences Abdomen: soft Extremities: 1+ edema with chronic changes  Additional Objective Labs: Basic Metabolic Panel: Recent Labs  Lab 11/09/18 1650 11/10/18 0404 11/11/18 0020  NA 135 136 136  K 3.3* 3.3* 3.6  CL 92* 92* 97*  CO2 32 31 27  GLUCOSE 141* 120* 126*  BUN 140* 136* 123*  CREATININE 3.04* 3.04* 2.95*  CALCIUM 8.8* 8.5* 8.7*   Liver Function Tests: Recent Labs  Lab 11/05/18 1934 11/10/18 0404  AST 22 19  ALT 14 14  ALKPHOS 37* 41  BILITOT 0.8 1.1  PROT 5.8* 5.6*  ALBUMIN 3.2* 2.8*   No results for input(s): LIPASE, AMYLASE in the last 168 hours. CBC: Recent Labs  Lab 11/06/18 1112  11/08/18 0639 11/09/18 0835 11/10/18 0404 11/11/18 0020  WBC 7.4  --  5.4 6.7 6.3 7.4  NEUTROABS  --   --   --  5.4  --   --   HGB 6.2*   < > 7.7* 7.7* 7.2* 8.2*  HCT 19.7*   < > 24.8* 25.7* 23.8* 27.4*  MCV 98.0  --  92.5 93.8 94.4 94.5  PLT 159  --  144* 158 147* 151   < > = values in this interval not displayed.   Blood Culture    Component Value Date/Time   SDES URINE, RANDOM 10/06/2018 1256   SPECREQUEST NONE 10/06/2018 1256   CULT (A) 10/06/2018 1256    <10,000 COLONIES/mL INSIGNIFICANT GROWTH Performed at [facility] [address] [place], [place] [address]    REPTSTATUS 10/07/2018 FINAL 10/06/2018 1256    Cardiac Enzymes: Recent Labs  Lab  11/05/18 2224 11/06/18 1112  TROPONINI 0.05* 0.06*   CBG: No results for input(s): GLUCAP in the last 168 hours. Iron Studies: No results for input(s): IRON, TIBC, TRANSFERRIN, FERRITIN in the last 72 hours. @lablastinr3 @ Studies/Results: Dg Chest Port 1 View  Result Date: 11/11/2018 CLINICAL DATA:  Right-sided PleurX catheter drain placed per EXAM: PORTABLE CHEST 1 VIEW COMPARISON:  11/08/2018 and chest CT 11/08/2018 FINDINGS: Left-sided pacemaker unchanged. Right-sided PleurX drainage catheter in place. Evidence of small right pleural effusion with significant interval improvement. Left lung is clear. Stable cardiomegaly. Remainder of the exam is unchanged. IMPRESSION: Interval improvement in right pleural effusion with small residual effusion. Right PleurX drainage catheter in place. Stable cardiomegaly. Electronically Signed   By: Marin Olp M.D.   On: 11/11/2018 11:17   Dg C-arm 1-60 Min-no Report  Result Date: 11/11/2018 Fluoroscopy was utilized by the requesting physician.  No radiographic interpretation.   Medications: . sodium chloride    . sodium chloride    . furosemide 120 mg (11/11/18 1229)   . sodium chloride   Intravenous Once  . acetaminophen  1,000  mg Oral Q6H   Or  . acetaminophen (TYLENOL) oral liquid 160 mg/5 mL  1,000 mg Oral Q6H  . allopurinol  300 mg Oral QPM  . [START ON 11/12/2018] bisacodyl  10 mg Oral Daily  . calcitRIOL  0.25 mcg Oral QODAY  . isosorbide mononitrate  15 mg Oral QHS  . multivitamin with minerals  1 tablet Oral QHS  . pantoprazole  40 mg Oral BID  . senna-docusate  1 tablet Oral QHS  . spironolactone  12.5 mg Oral Daily  . torsemide  80 mg Oral BID  . vitamin B-12  500 mcg Oral Daily    Dialysis Orders:  Assessment/Plan:  1.  Anemia:  Presumed GI source.    Has required transfusions.  Hemoglobin improved to 8.2 this morning..    Patient has declined colonoscopy.  2.  CKD 4:  Creatinine within recent baseline currently.  Watch  closely.   3.  H/o MR, TR, CHF: cardiology to consult while admitted.  She is near her baseline volume status.  S/p lasix 120 IV yesterday, now back to home torsemide 80 BID, also aldactone 12.5 while in hospital.  At home take metolazone PRN with good effect.  Supplementing K as needed.   4.  Atrial fibrillation with AV node ablated and PP  5.  Pleural effusion: Status post pleural catheter 11/11/2018 dyspnea improving.  Rexene Agent

## 2018-11-11 NOTE — Progress Notes (Signed)
PROGRESS NOTE    [name] [name]  XBJ:[id number] DOB: Dec 28, 1945 DOA: 11/05/2018 PCP: [name] Amel, MD   Brief Narrative: HPI as per [name] [name]: 73 y.o. 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  Patient reports having fatigue and dyspnea for the past 2 days.  Denies having any hematemesis, hematochezia, or melena.  Denies having any abdominal pain.  States her gastroenterologist is [name]. Chartered loss adjuster in [place].  Reports having history of angina but states she has been having intermittent substernal chest pressure at rest lasting 30 to 40 seconds for the past few days. Daughter at bedside states patient takes torsemide and metolazone for her lower extremity edema. Patient was admitted, seen by nephrology cardiology and gastroenterology.  Patient was reluctant for further colonoscopy and GI has signed off.  Subjective: Patient seen today status post right Pleurx catheter.  Feels breathing is much improved.  Saturating well on room air.  No nausea vomiting chest pain.  Assessment & Plan:   Symptomatic anemia: Suspecting in the setting of anemia of chronic disease/renal disease , possible slow gi bleed. status pos total 4 unit, HB up at 8.2 gm. Also got iv  ferric gluconate 125 mg x4 doses. S/p tapwater enema for constipation- had tarry stool. She had recent extensive work-up including EGD and capsule endoscopy recently. Stool heme positive in the ER.  Seen by GI and patient has refused colonoscopy. Remains on Protonix twice daily.Again discussed [name]. Havery [name] from GI 2/16-he stated that unless patient is willing to have colonoscopy there not much GI can offer,she has refused colonoscopy  twice when he discussed with her.  Today patient has a daughter at the bedside and thy are willing to proceed through colonoscopy, understands risks and benefits.  Recent Labs  Lab 11/07/18 1018 11/08/18 0639 11/09/18 0835 11/10/18 0404 11/11/18 0020  HGB 7.2* 7.7* 7.7* 7.2* 8.2*  HCT 23.1* 24.8* 25.7* 23.8* 27.4*   Acute on chronic chronic combined systolic and diastolic CHF with pleural effusion:   Last lvef is  30% in January 2020. has chronic appearing leg edema with chronic hyperpigmentation, and has leg edema.  Lasix monitor milligrams IV x1 per cardiology, continue torsemide 80 mg twice daily while not on IV Lasix. Cont Aldactone. Cardiology managing  Recurrent Right-sided Pleural effusion:s/p pleurex catheter by CT surgery this am.   Chest pain, likely in the setting of severe anemia.Troponin is at 0.05- 0.06 and flat.  EKG was paced rhythm.  Currently is following closely.We will try to keep up with hemoglobin. Unable to use antiplatelet due to her anemia, continue Imdur.  Cardiology is following. Has not had chest pain.  CKD stage IV, baseline creatinine 2.8- creatinine seem abt the baseline in low 3s as below. Patient is known to [facility] and is being followed closely.  Recent Labs  Lab 11/08/18 0639 11/09/18 0835 11/09/18 1650 11/10/18 0404 11/11/18 0020  CREATININE 3.02* 2.99* 3.04* 3.04* 2.95*    Hypokalemia: improved, Cont on po kcl. Recent Labs  Lab 11/08/18 0639 11/09/18 0835 11/09/18 1650 11/10/18 0404 11/11/18 0020  K 2.5* 3.0* 3.3* 3.3* 3.6   PAF: s/p post AV nodal ablation and PPM.Chronic RV pacing in the 90s.She has refused anticoagulation.  OSA on CPAP nightly  Hx of SA node dysfunction with  pacemaker in place.  DVT prophylaxis: SCD Code Status: Patient has been changed to DNR/DNI. Family Communication:I have updated pt's daughter at the bedside today.   Disposition Plan: Remains inpatient pending clinical improvement.  PT OT consulted,  patient and family interested in a skilled nursing facility. GI notified abt patient's decision on colonoscopy.  Consultants:  Gastroenterology  Cardiology Nephrology CT surgery  Procedures:  Antimicrobials: Anti-infectives (From admission, onward)   Start     Dose/Rate Route Frequency Ordered Stop   11/10/18 0830  vancomycin (VANCOCIN) 1,500 mg in sodium chloride 0.9 % 500 mL IVPB  Status:  Discontinued     1,500 mg 250 mL/hr over 120 Minutes Intravenous On call to O.R. 11/10/18 0806 11/11/18 0559       Objective: Vitals:   11/11/18 0815 11/11/18 0915 11/11/18 0945 11/11/18 1000  BP:  (!) 99/51 (!) 103/57 (!) 93/55  Pulse: 96 (!) 102 99 (!) 101  Resp: (!) 35 (!) 21 (!) 22 (!) 21  Temp:  98.2 F (36.8 C)  98.6 F (37 C)  TempSrc:      SpO2: 100% 100% 93% 94%  Weight:      Height:        Intake/Output Summary (Last 24 hours) at 11/11/2018 1313 Last data filed at 11/11/2018 0901 Gross per 24 hour  Intake 810 ml  Output 2960 ml  Net -2150 ml   Filed Weights   11/08/18 2048 11/09/18 2103 11/10/18 2122  Weight: 112.4 kg 112.2 kg 112 kg   Weight change: -0.2 kg  Body mass index is 39.85 kg/m.  Intake/Output from previous day: 02/16 0701 - 02/17 0700 In: 1080 [P.O.:690; I.V.:25; Blood:315; IV Piggyback:50] Out: 2350 [Urine:2350] Intake/Output this shift: Total I/O In: 300 [I.V.:300] Out: 1510 [Other:1500; Blood:10]  Examination: General exam: Calm, comfortable, not in acute distress, older for age, average built.  HEENT:Oral mucosa moist, Ear/Nose WNL grossly, dentition normal. Respiratory system: Bilateral equal air entry, no crackles and wheezing, no use of accessory muscle, non tender on palpation. Cardiovascular system: regular rate and rhythm, S1 & S2 heard, No JVD/murmurs. Gastrointestinal system: Abdomen soft, non-tender, non-distended, BS +. No hepatosplenomegaly palpable. Nervous System:Alert, awake and oriented at baseline. Able to move UE and LE,  sensation intact. Extremities: b/l leg edema++/chronic appearing hyperpigmentation.  Skin: Bruise on the right arm at prior iv site. No rashes,no icterus. MSK: Normal muscle bulk,tone, power  Medications:  Scheduled Meds: . sodium chloride   Intravenous Once  . acetaminophen  1,000 mg Oral Q6H   Or  . acetaminophen (TYLENOL) oral liquid 160 mg/5 mL  1,000 mg Oral Q6H  . allopurinol  300 mg Oral QPM  . [START ON 11/12/2018] bisacodyl  10 mg Oral Daily  . calcitRIOL  0.25 mcg Oral QODAY  . isosorbide mononitrate  15 mg Oral QHS  . multivitamin with minerals  1 tablet Oral QHS  . pantoprazole  40 mg Oral BID  . senna-docusate  1 tablet Oral QHS  . spironolactone  12.5 mg Oral Daily  . torsemide  80 mg Oral BID  . vitamin B-12  500 mcg Oral Daily   Continuous Infusions: . sodium chloride    . sodium chloride    . furosemide 120 mg (11/11/18 1229)    Data Reviewed: I have personally reviewed following labs and imaging studies  CBC: Recent Labs  Lab 11/06/18 1112  11/07/18 1018 11/08/18 0639 11/09/18 0835 11/10/18 0404 11/11/18 0020  WBC 7.4  --   --  5.4  6.7 6.3 7.4  NEUTROABS  --   --   --   --  5.4  --   --   HGB 6.2*   < > 7.2* 7.7* 7.7* 7.2* 8.2*  HCT 19.7*   < > 23.1* 24.8* 25.7* 23.8* 27.4*  MCV 98.0  --   --  92.5 93.8 94.4 94.5  PLT 159  --   --  144* 158 147* 151   < > = values in this interval not displayed.   Basic Metabolic Panel: Recent Labs  Lab 11/05/18 2224  11/08/18 0639 11/08/18 0829 11/09/18 0835 11/09/18 1650 11/10/18 0404 11/11/18 0020  NA  --    < > 138  --  135 135 136 136  K  --    < > 2.5*  --  3.0* 3.3* 3.3* 3.6  CL  --    < > 94*  --  91* 92* 92* 97*  CO2  --    < > 30  --  31 32 31 27  GLUCOSE  --    < > 121*  --  163* 141* 120* 126*  BUN  --    < > 158*  --  146* 140* 136* 123*  CREATININE  --    < > 3.02*  --  2.99* 3.04* 3.04* 2.95*  CALCIUM  --    < > 8.7*  --  9.0 8.8* 8.5* 8.7*  MG 3.0*  --   --  3.0*  --   --   --   --     < > = values in this interval not displayed.   GFR: Estimated Creatinine Clearance: 21.6 mL/min (A) (by C-G formula based on SCr of 2.95 mg/dL (H)). Liver Function Tests: Recent Labs  Lab 11/05/18 1934 11/10/18 0404  AST 22 19  ALT 14 14  ALKPHOS 37* 41  BILITOT 0.8 1.1  PROT 5.8* 5.6*  ALBUMIN 3.2* 2.8*   No results for input(s): LIPASE, AMYLASE in the last 168 hours. No results for input(s): AMMONIA in the last 168 hours. Coagulation Profile: Recent Labs  Lab 11/10/18 0404  INR 1.19   Cardiac Enzymes: Recent Labs  Lab 11/05/18 2224 11/06/18 1112  TROPONINI 0.05* 0.06*   BNP (last 3 results) No results for input(s): PROBNP in the last 8760 hours. HbA1C: No results for input(s): HGBA1C in the last 72 hours. CBG: No results for input(s): GLUCAP in the last 168 hours. Lipid Profile: No results for input(s): CHOL, HDL, LDLCALC, TRIG, CHOLHDL, LDLDIRECT in the last 72 hours. Thyroid Function Tests: Recent Labs    11/10/18 0404  TSH 2.980   Anemia Panel: No results for input(s): VITAMINB12, FOLATE, FERRITIN, TIBC, IRON, RETICCTPCT in the last 72 hours. Sepsis Labs: No results for input(s): PROCALCITON, LATICACIDVEN in the last 168 hours.  Recent Results (from the past 240 hour(s))  Surgical pcr screen     Status: None   Collection Time: 11/09/18 12:44 PM  Result Value Ref Range Status   MRSA, PCR NEGATIVE NEGATIVE Final   Staphylococcus aureus NEGATIVE NEGATIVE Final    Comment: (NOTE) The Xpert SA Assay (FDA approved for NASAL specimens in patients 40 years of age and older), is one component of a comprehensive surveillance program. It is not intended to diagnose infection nor to guide or monitor treatment. Performed at [facility], [place] [address] [place], [place] [address]       Radiology Studies: Dg Chest [facility] 1 View  Result Date:  11/11/2018 CLINICAL DATA:  Right-sided PleurX catheter drain placed per EXAM: PORTABLE CHEST 1 VIEW COMPARISON:   11/08/2018 and chest CT 11/08/2018 FINDINGS: Left-sided pacemaker unchanged. Right-sided PleurX drainage catheter in place. Evidence of small right pleural effusion with significant interval improvement. Left lung is clear. Stable cardiomegaly. Remainder of the exam is unchanged. IMPRESSION: Interval improvement in right pleural effusion with small residual effusion. Right PleurX drainage catheter in place. Stable cardiomegaly. Electronically Signed   By: Marin Olp M.D.   On: 11/11/2018 11:17   Dg C-arm 1-60 Min-no Report  Result Date: 11/11/2018 Fluoroscopy was utilized by the requesting physician.  No radiographic interpretation.      LOS: 5 days   Time spent: More than 50% of that time was spent in counseling and/or coordination of care.  Antonieta Pert, MD [facility]  11/11/2018, 1:13 PM

## 2018-11-12 ENCOUNTER — Encounter (HOSPITAL_COMMUNITY): Payer: Self-pay | Admitting: Cardiothoracic Surgery

## 2018-11-12 ENCOUNTER — Telehealth: Payer: Self-pay | Admitting: Gastroenterology

## 2018-11-12 LAB — BASIC METABOLIC PANEL
Anion gap: 13 (ref 5–15)
BUN: 110 mg/dL — ABNORMAL HIGH (ref 8–23)
CO2: 28 mmol/L (ref 22–32)
Calcium: 8.6 mg/dL — ABNORMAL LOW (ref 8.9–10.3)
Chloride: 95 mmol/L — ABNORMAL LOW (ref 98–111)
Creatinine, Ser: 2.79 mg/dL — ABNORMAL HIGH (ref 0.44–1.00)
GFR calc Af Amer: 19 mL/min — ABNORMAL LOW (ref >60)
GFR calc non Af Amer: 16 mL/min — ABNORMAL LOW (ref >60)
Glucose, Bld: 106 mg/dL — ABNORMAL HIGH (ref 70–99)
POTASSIUM: 3.5 mmol/L (ref 3.5–5.1)
Sodium: 136 mmol/L (ref 135–145)

## 2018-11-12 LAB — CBC
HCT: 25.5 % — ABNORMAL LOW (ref 36.0–46.0)
Hemoglobin: 7.8 g/dL — ABNORMAL LOW (ref 12.0–15.0)
MCH: 29.3 pg (ref 26.0–34.0)
MCHC: 30.6 g/dL (ref 30.0–36.0)
MCV: 95.9 fL (ref 80.0–100.0)
Platelets: 137 10*3/uL — ABNORMAL LOW (ref 150–400)
RBC: 2.66 MIL/uL — ABNORMAL LOW (ref 3.87–5.11)
RDW: 19.9 % — ABNORMAL HIGH (ref 11.5–15.5)
WBC: 7.3 10*3/uL (ref 4.0–10.5)
nRBC: 0 % (ref 0.0–0.2)

## 2018-11-12 MED ORDER — TORSEMIDE 20 MG PO TABS: 80.0000 mg | ORAL_TABLET | Freq: Every day | ORAL | Status: DC

## 2018-11-12 MED ORDER — FUROSEMIDE 10 MG/ML IJ SOLN
120.0000 mg | Freq: Once | INTRAVENOUS | Status: AC
  Administered 2018-11-12: 120 mg via INTRAVENOUS
  Filled 2018-11-12: qty 10

## 2018-11-12 NOTE — Evaluation (Signed)
Occupational Therapy Evaluation Patient Details Name: [name] [name] MRN: [id number] DOB: 1945/12/17 Today's Date: 11/12/2018    History of Present Illness Pt is a 73 y/o female admitted secondary to symptomatic anemia secondary to possible GI bleed. Pt initially refusing colonoscopy, however, now agreeable. Pt also found to have R pleural effusion and is s/p R pleurx drain on 11/11/2018. PMH includes CHF, HTN, sleep apnea, a fib, s/p pacemaker.    Clinical Impression   Pt with decline in function and safety with ADLs and ADL mobility with decrease strength, balance, endurance and safety. Pt becomes easily agitated when asking her to get OOB for activity but states that she wants to get better and not lay in bed too much. Pt reports that she was independent with selfcare PTA. Pt will d/c to a SNF for further rehab once medically stable. No further acute OT is indicated at this time, will defer further OT intervention to SNF    Follow Up Recommendations  SNF    Equipment Recommendations  Other (comment)(TBD at next venue of care)    Recommendations for Other Services       Precautions / Restrictions Precautions Precautions: Fall Restrictions Weight Bearing Restrictions: No      Mobility Bed Mobility Overal bed mobility: Needs Assistance Bed Mobility: Supine to Sit;Sit to Supine     Supine to sit: Supervision;HOB elevated Sit to supine: Supervision   General bed mobility comments: increased time nd effort, cues for safety and log roll technique  Transfers Overall transfer level: Needs assistance Equipment used: None Transfers: Sit to/from [facility] Sit to Stand: Min guard Stand pivot transfers: Min guard            Balance Overall balance assessment: Needs assistance Sitting-balance support: No upper extremity supported;Feet supported Sitting balance-Leahy Scale: Good     Standing balance support: Single extremity supported;During functional  activity Standing balance-Leahy Scale: Poor                             ADL either performed or assessed with clinical judgement   ADL Overall ADL's : Needs assistance/impaired     Grooming: Wash/dry hands;Wash/dry face;Supervision/safety;Set up;Sitting   Upper Body Bathing: Supervision/ safety;Set up;Sitting   Lower Body Bathing: Maximal assistance   Upper Body Dressing : Set up;Supervision/safety;Sitting   Lower Body Dressing: Maximal assistance   Toilet Transfer: Min guard;Stand-pivot;BSC   Toileting- Clothing Manipulation and Hygiene: Moderate assistance;Sit to/from stand       Functional mobility during ADLs: Cueing for safety;Cueing for sequencing;Min guard       Vision Patient Visual Report: No change from baseline       Perception     Praxis      Pertinent Vitals/Pain Faces Pain Scale: Hurts even more Pain Location: R pleurx incision site Pain Descriptors / Indicators: Operative site guarding;Grimacing;Guarding Pain Intervention(s): Limited activity within patient's tolerance;Monitored during session;Repositioned     Hand Dominance Right   Extremity/Trunk Assessment Upper Extremity Assessment Upper Extremity Assessment: Generalized weakness   Lower Extremity Assessment Lower Extremity Assessment: Defer to PT evaluation   Cervical / Trunk Assessment Cervical / Trunk Assessment: Kyphotic   Communication Communication Communication: No difficulties   Cognition Arousal/Alertness: Awake/alert Behavior During Therapy: WFL for tasks assessed/performed Overall Cognitive Status: No family/caregiver present to determine baseline cognitive functioning  General Comments: Pt somewhat annoyed with having to perform mobility tasks, however, eventually agreeable to [facility] transfer   General Comments       Exercises     Shoulder Instructions      Home Living Family/patient expects to be discharged  to:: Private residence Living Arrangements: Children Available Help at Discharge: Family;Available PRN/intermittently Type of Home: House Home Access: Level entry     Home Layout: Two level;Able to live on main level with bedroom/bathroom Alternate Level Stairs-Number of Steps: flight    Bathroom Shower/Tub: Occupational psychologist: Handicapped height     Home Equipment: Tub bench;Walker - 2 wheels;Bedside commode;Cane - single point;Electric scooter;Hospital bed;Wheelchair - manual   Additional Comments: RW over 13 years old and needs a new one      Prior Functioning/Environment Level of Independence: Independent with assistive device(s)        Comments: Reports using WC and electric scooter for mobility tasks. Reports independence with ADL tasks.         OT Problem List: Decreased strength;Decreased activity tolerance;Increased edema;Impaired balance (sitting and/or standing);Decreased knowledge of precautions;Obesity;Pain      OT Treatment/Interventions:      OT Goals(Current goals can be found in the care plan section) Acute Rehab OT Goals Patient Stated Goal: get better OT Goal Formulation: With patient  OT Frequency:     Barriers to D/C: Decreased caregiver support          Co-evaluation              AM-PAC OT "6 Clicks" Daily Activity     Outcome Measure Help from another person eating meals?: None Help from another person taking care of personal grooming?: A Little Help from another person toileting, which includes using toliet, bedpan, or urinal?: A Lot Help from another person bathing (including washing, rinsing, drying)?: A Lot Help from another person to put on and taking off regular upper body clothing?: A Little Help from another person to put on and taking off regular lower body clothing?: A Lot 6 Click Score: 16   End of Session Equipment Utilized During Treatment: Other (comment)(BSC)  Activity Tolerance: Patient limited by  fatigue Patient left: in bed;with call bell/phone within reach  OT Visit Diagnosis: Unsteadiness on feet (R26.81);Other abnormalities of gait and mobility (R26.89);Muscle weakness (generalized) (M62.81)                Time: 2836-6294 OT Time Calculation (min): 24 min Charges:  OT General Charges $OT Visit: 1 Visit OT Evaluation $OT Eval Moderate Complexity: 1 Mod OT Treatments $Self Care/Home Management : 8-22 mins    Britt Bottom 11/12/2018, 12:32 PM

## 2018-11-12 NOTE — Progress Notes (Signed)
[name] Progress Note   Subjective:     On room air this morning, IV twice daily Lasix per AHF  Stable renal function, serum creatinine 2.79, BUN improved to 110, potassium 3.5  1.8 L urine output yesterday  Objective Vitals:   11/11/18 1000 11/11/18 2121 11/12/18 0424 11/12/18 0820  BP: (!) 93/55 (!) 91/42 (!) 91/55 92/60  Pulse: (!) 101 (!) 105 97 100  Resp: (!) 21 18 18 18   Temp: 98.6 F (37 C) 98.2 F (36.8 C) 97.6 F (36.4 C) 97.6 F (36.4 C)  TempSrc:  Oral Oral Oral  SpO2: 94% 95% 97% 98%  Weight:      Height:       Physical Exam General: Obese, chronically ill-appearing, elderly female Heart: irreg, syst murmur unchanged Lungs: dec BS bases R to about 2/3 up field, difficulty with complete sentences Abdomen: soft Extremities: 1+ edema with chronic changes  Additional Objective Labs: Basic Metabolic Panel: Recent Labs  Lab 11/10/18 0404 11/11/18 0020 11/12/18 0447  NA 136 136 136  K 3.3* 3.6 3.5  CL 92* 97* 95*  CO2 31 27 28   GLUCOSE 120* 126* 106*  BUN 136* 123* 110*  CREATININE 3.04* 2.95* 2.79*  CALCIUM 8.5* 8.7* 8.6*   Liver Function Tests: Recent Labs  Lab 11/05/18 1934 11/10/18 0404  AST 22 19  ALT 14 14  ALKPHOS 37* 41  BILITOT 0.8 1.1  PROT 5.8* 5.6*  ALBUMIN 3.2* 2.8*   No results for input(s): LIPASE, AMYLASE in the last 168 hours. CBC: Recent Labs  Lab 11/08/18 0639 11/09/18 0835 11/10/18 0404 11/11/18 0020 11/12/18 0447  WBC 5.4 6.7 6.3 7.4 7.3  NEUTROABS  --  5.4  --   --   --   HGB 7.7* 7.7* 7.2* 8.2* 7.8*  HCT 24.8* 25.7* 23.8* 27.4* 25.5*  MCV 92.5 93.8 94.4 94.5 95.9  PLT 144* 158 147* 151 137*   Blood Culture    Component Value Date/Time   SDES URINE, RANDOM 10/06/2018 1256   SPECREQUEST NONE 10/06/2018 1256   CULT (A) 10/06/2018 1256    <10,000 COLONIES/mL INSIGNIFICANT GROWTH Performed at [facility] [address], [place], [place] [address]    REPTSTATUS 10/07/2018 FINAL  10/06/2018 1256    Cardiac Enzymes: Recent Labs  Lab 11/05/18 2224 11/06/18 1112  TROPONINI 0.05* 0.06*   CBG: No results for input(s): GLUCAP in the last 168 hours. Iron Studies: No results for input(s): IRON, TIBC, TRANSFERRIN, FERRITIN in the last 72 hours. @lablastinr3 @ Studies/Results: Dg Chest Port 1 View  Result Date: 11/11/2018 CLINICAL DATA:  Right-sided PleurX catheter drain placed per EXAM: PORTABLE CHEST 1 VIEW COMPARISON:  11/08/2018 and chest CT 11/08/2018 FINDINGS: Left-sided pacemaker unchanged. Right-sided PleurX drainage catheter in place. Evidence of small right pleural effusion with significant interval improvement. Left lung is clear. Stable cardiomegaly. Remainder of the exam is unchanged. IMPRESSION: Interval improvement in right pleural effusion with small residual effusion. Right PleurX drainage catheter in place. Stable cardiomegaly. Electronically Signed   By: Marin Olp M.D.   On: 11/11/2018 11:17   Dg C-arm 1-60 Min-no Report  Result Date: 11/11/2018 Fluoroscopy was utilized by the requesting physician.  No radiographic interpretation.   Medications: . sodium chloride 10 mL/hr at 11/11/18 1643  . sodium chloride    . furosemide     . sodium chloride   Intravenous Once  . acetaminophen  1,000 mg Oral Q6H   Or  . acetaminophen (TYLENOL) oral liquid  160 mg/5 mL  1,000 mg Oral Q6H  . allopurinol  300 mg Oral QPM  . bisacodyl  10 mg Oral Daily  . calcitRIOL  0.25 mcg Oral QODAY  . isosorbide mononitrate  15 mg Oral QHS  . multivitamin with minerals  1 tablet Oral QHS  . pantoprazole  40 mg Oral BID  . senna-docusate  1 tablet Oral QHS  . spironolactone  12.5 mg Oral Daily  . vitamin B-12  500 mcg Oral Daily    Dialysis Orders:  Assessment/Plan:  1.  Anemia:  Presumed GI source.    Has required transfusions.  Hemoglobin slightly lower at 7.8 this morning..    Patient has declined colonoscopy.  GI following  2.  CKD 4:  Creatinine within  recent baseline currently.  Watch closely.   3.  H/o MR, TR, CHF: cardiology to consult while admitted.  She is near her baseline volume status.  Diuretic dosing per AHF.  Supplementing K as needed.   4.  Atrial fibrillation with AV node ablated and PP  5.  Pleural effusion: Status post pleural catheter 11/11/2018 dyspnea improved now on RA  [employer]

## 2018-11-12 NOTE — Progress Notes (Signed)
Patient drained 1200 cc via pleurix drain. Tolerated well. Dorthey Sawyer, RN

## 2018-11-12 NOTE — Care Management Important Message (Signed)
Important Message  Patient Details  Name: [name] [name] MRN: [id number] Date of Birth: 08-10-1946   Medicare Important Message Given:  Yes    Jamaurie Bernier P Bonner 11/12/2018, 1:03 PM

## 2018-11-12 NOTE — Telephone Encounter (Signed)
[phone] PLEASE CALL PATIENT DAUGHTER, SHE HAS A QUESTION ABOUT A TEST THEY WANT TO PERFORM ON HER MOTHER

## 2018-11-12 NOTE — Progress Notes (Signed)
PROGRESS NOTE    [name] [name]  ZJQ:[id number] DOB: 1946/03/12 DOA: 11/05/2018 PCP: [name], MD   Brief Narrative: HPI as per [name]: 73 y.o. 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  Denies having any hematemesis, hematochezia, or melena.  Denies having any abdominal pain.  States her gastroenterologist is Dr. Chartered loss adjuster in [place].  Reports having history of angina but states she has been having intermittent substernal chest pressure at rest lasting 30 to 40 seconds for the past few days. Daughter at bedside states patient takes torsemide and metolazone for her lower extremity edema. Patient was admitted, seen by nephrology/cardiology and gastroenterology.  Patient continues to drift her hemoglobin, needing PRBC transfusion so far for.  Initially refused colonoscopy and GI and reconsult felt she is high risk.  Subjective: No new complaints.  Has shortness of breath which is her baseline.  Has chronic leg edema. No new complaints denies nausea vomiting or chest pain. Rt arm swollen.  Assessment & Plan:   Symptomatic anemia: Suspecting acute blood loss anemia from slow gi bloos in the setting of anemia of chronic disease/renal disease. S/p total 4 unit. HB 5.2->>8.2->7.8 gm. Drifting down. S/p iv  Ron x 4 doses.Recent extensive work-up including EGD and capsule endoscopy in [place] 202- and unremarkable. Is heme positive in the ER. Seen by GI and patient initially refused colonoscopy.  Again GI revisited 2/19 felt to be poor candidate for colonoscopy.  Cont on PPI BID.  Cardio  d/w ed GI who are planning to revist - ?octreotride. No role of tagged rbc scan as likely slow bleed. Recent Labs  Lab 11/08/18 0639 11/09/18 0835 11/10/18 0404 11/11/18 0020 11/12/18 0447  HGB 7.7* 7.7* 7.2* 8.2* 7.8*  HCT 24.8* 25.7* 23.8* 27.4* 25.5*   Constipation s/p tap water enema w tarry stool.  Cont prn laxatives.  Acute on chronic chronic combined systolic and diastolic CHF with pleural effusion:   Last lvef is  30% in January 2020. has leg edema and appears somewhat volume noted.  Cardiology managing and has increased Lasix to 120 mg IV twice daily.  Monitor intake output and daily weight. Cont Aldactone.   Recurrent Right-sided Pleural effusion:s/p pleurex catheter by CT surgery 2/17 w w.5 l fluid  Chest pain, likely in the setting of severe anemia.Troponin is at 0.05- 0.06 and flat.  EKG was paced rhythm. No CP now. keep up with hemoglobin close to 8.Unable to use antiplatelet due to her anemia, continue Imdur.  Cardiology on board  CKD stage IV, baseline creatinine 2.8-creatinine around baseline. Seen by Nephrology Recent Labs  Lab 11/09/18 0835 11/09/18 1650 11/10/18 0404 11/11/18 0020 11/12/18 0447  CREATININE 2.99* 3.04* 3.04* 2.95* 2.79*    Hypokalemia: improved, Cont on po kcl.  Recent Labs  Lab 11/09/18 0835 11/09/18 1650 11/10/18 0404 11/11/18 0020 11/12/18 0447  K 3.0* 3.3* 3.3* 3.6 3.5   PAF: s/p post AV nodal ablation and PPM.Chronic RV pacing . She has refused anticoagulation.  OSA on CPAP nightly  Hx of SA node dysfunction with pacemaker in place.  Discussed with the cardiology team.  DVT prophylaxis: SCD.  No Chemical prophylaxis due to GI bleed. Code Status: Patient has been changed to DNR/DNI. Family Communication:I have updated pt's daughter at the bedside 2/17.none at bedside.   Disposition Plan: Remains inpatient pending clinical improvement. Cont PT/OT, anticipating SNF  Consultants:  Gastroenterology  Cardiology Nephrology CT  surgery  Procedures:  Antimicrobials: Anti-infectives (From admission, onward)   Start     Dose/Rate Route Frequency Ordered Stop   11/10/18 0830  vancomycin (VANCOCIN) 1,500 mg in sodium chloride 0.9 % 500 mL IVPB  Status:  Discontinued     1,500 mg 250 mL/hr over 120 Minutes Intravenous On call to O.R. 11/10/18 0806 11/11/18 0559       Objective: Vitals:   11/11/18 1000 11/11/18 2121 11/12/18 0424 11/12/18 0820  BP: (!) 93/55 (!) 91/42 (!) 91/55 92/60  Pulse: (!) 101 (!) 105 97 100  Resp: (!) 21 18 18 18   Temp: 98.6 F (37 C) 98.2 F (36.8 C) 97.6 F (36.4 C) 97.6 F (36.4 C)  TempSrc:  Oral Oral Oral  SpO2: 94% 95% 97% 98%  Weight:      Height:        Intake/Output Summary (Last 24 hours) at 11/12/2018 1139 Last data filed at 11/12/2018 1121 Gross per 24 hour  Intake 999.98 ml  Output 2300 ml  Net -1300.02 ml   Filed Weights   11/08/18 2048 11/09/18 2103 11/10/18 2122  Weight: 112.4 kg 112.2 kg 112 kg   Weight change:   Body mass index is 39.85 kg/m.  Intake/Output from previous day: 02/17 0701 - 02/18 0700 In: 1000 [P.O.:600; I.V.:400] Out: 3310 [Urine:1800; Blood:10] Intake/Output this shift: Total I/O In: 300 [P.O.:300] Out: 500 [Urine:500]  Examination: General exam: Calm, comfortable, obese, chronically sick looking,not in acute distress, older for age. HEENT:Oral mucosa moist, Ear/Nose WNL grossly, dentition normal. Respiratory system: Bilateral equal air entry, no crackles and wheezing, no use of accessory muscle, non tender on palpation. Cardiovascular system: regular rate and rhythm, S1 & S2 heard, No JVD/murmurs. Gastrointestinal system: Abdomen soft, non-tender, non-distended, BS +. No hepatosplenomegaly palpable. Nervous System:Alert, awake and oriented at baseline. Able to move UE and LE, sensation intact. Extremities: Bilateral  Ankle edema, chronic hyperpigmentation and skin changes. RUE edema. Skin: No rashes,no icterus. MSK: Normal  muscle bulk,tone, power  Medications:  Scheduled Meds: . sodium chloride   Intravenous Once  . acetaminophen  1,000 mg Oral Q6H   Or  . acetaminophen (TYLENOL) oral liquid 160 mg/5 mL  1,000 mg Oral Q6H  . allopurinol  300 mg Oral QPM  . bisacodyl  10 mg Oral Daily  . calcitRIOL  0.25 mcg Oral QODAY  . isosorbide mononitrate  15 mg Oral QHS  . multivitamin with minerals  1 tablet Oral QHS  . pantoprazole  40 mg Oral BID  . senna-docusate  1 tablet Oral QHS  . spironolactone  12.5 mg Oral Daily  . vitamin B-12  500 mcg Oral Daily   Continuous Infusions: . sodium chloride 10 mL/hr at 11/11/18 1643  . sodium chloride    . furosemide      Data Reviewed: I have personally reviewed following labs and imaging studies  CBC: Recent Labs  Lab 11/08/18 0639 11/09/18 0835 11/10/18 0404 11/11/18 0020 11/12/18 0447  WBC 5.4 6.7 6.3 7.4 7.3  NEUTROABS  --  5.4  --   --   --   HGB 7.7* 7.7* 7.2* 8.2* 7.8*  HCT 24.8* 25.7* 23.8* 27.4* 25.5*  MCV 92.5 93.8 94.4 94.5  95.9  PLT 144* 158 147* 151 478*   Basic Metabolic Panel: Recent Labs  Lab 11/05/18 2224  11/08/18 0829 11/09/18 0835 11/09/18 1650 11/10/18 0404 11/11/18 0020 11/12/18 0447  NA  --    < >  --  135 135 136 136 136  K  --    < >  --  3.0* 3.3* 3.3* 3.6 3.5  CL  --    < >  --  91* 92* 92* 97* 95*  CO2  --    < >  --  31 32 31 27 28   GLUCOSE  --    < >  --  163* 141* 120* 126* 106*  BUN  --    < >  --  146* 140* 136* 123* 110*  CREATININE  --    < >  --  2.99* 3.04* 3.04* 2.95* 2.79*  CALCIUM  --    < >  --  9.0 8.8* 8.5* 8.7* 8.6*  MG 3.0*  --  3.0*  --   --   --   --   --    < > = values in this interval not displayed.   GFR: Estimated Creatinine Clearance: 22.8 mL/min (A) (by C-G formula based on SCr of 2.79 mg/dL (H)). Liver Function Tests: Recent Labs  Lab 11/05/18 1934 11/10/18 0404  AST 22 19  ALT 14 14  ALKPHOS 37* 41  BILITOT 0.8 1.1  PROT 5.8* 5.6*  ALBUMIN 3.2* 2.8*   No results for  input(s): LIPASE, AMYLASE in the last 168 hours. No results for input(s): AMMONIA in the last 168 hours. Coagulation Profile: Recent Labs  Lab 11/10/18 0404  INR 1.19   Cardiac Enzymes: Recent Labs  Lab 11/05/18 2224 11/06/18 1112  TROPONINI 0.05* 0.06*   BNP (last 3 results) No results for input(s): PROBNP in the last 8760 hours. HbA1C: No results for input(s): HGBA1C in the last 72 hours. CBG: No results for input(s): GLUCAP in the last 168 hours. Lipid Profile: No results for input(s): CHOL, HDL, LDLCALC, TRIG, CHOLHDL, LDLDIRECT in the last 72 hours. Thyroid Function Tests: Recent Labs    11/10/18 0404  TSH 2.980   Anemia Panel: No results for input(s): VITAMINB12, FOLATE, FERRITIN, TIBC, IRON, RETICCTPCT in the last 72 hours. Sepsis Labs: No results for input(s): PROCALCITON, LATICACIDVEN in the last 168 hours.  Recent Results (from the past 240 hour(s))  Surgical pcr screen     Status: None   Collection Time: 11/09/18 12:44 PM  Result Value Ref Range Status   MRSA, PCR NEGATIVE NEGATIVE Final   Staphylococcus aureus NEGATIVE NEGATIVE Final    Comment: (NOTE) The Xpert SA Assay (FDA approved for NASAL specimens in patients 49 years of age and older), is one component of a comprehensive surveillance program. It is not intended to diagnose infection nor to guide or monitor treatment. Performed at [facility], [place] [address], [place], [place] [address]       Radiology Studies: Dg Chest Port 1 View  Result Date: 11/11/2018 CLINICAL DATA:  Right-sided PleurX catheter drain placed per EXAM: PORTABLE CHEST 1 VIEW COMPARISON:  11/08/2018 and chest CT 11/08/2018 FINDINGS: Left-sided pacemaker unchanged. Right-sided PleurX drainage catheter in place. Evidence of small right pleural effusion with significant interval improvement. Left lung is clear. Stable cardiomegaly. Remainder of the exam is unchanged. IMPRESSION: Interval improvement in right pleural  effusion with small residual effusion. Right PleurX drainage catheter in place. Stable cardiomegaly. Electronically Signed  By: Marin Olp M.D.   On: 11/11/2018 11:17   Dg C-arm 1-60 Min-no Report  Result Date: 11/11/2018 Fluoroscopy was utilized by the requesting physician.  No radiographic interpretation.      LOS: 6 days   Time spent: More than 50% of that time was spent in counseling and/or coordination of care.  Antonieta Pert, MD [facility]  11/12/2018, 11:39 AM

## 2018-11-12 NOTE — Telephone Encounter (Signed)
I spoke to pt's daughter, Marcie Bal and she said pt is at [facility]. She is losing blood and was advised to have a colonoscopy. She has some questions about a procedure that they are going to do. She said they were going to do a Red Tag Study and she is not sure all about it. I told her to feel free to ask them to explain anything that she does not understand. [facility] is seeing pt in the hospital. She said that she will ask them questions.

## 2018-11-12 NOTE — Progress Notes (Addendum)
Daily Rounding Note  11/12/2018, 12:17 PM  LOS: 6 days   SUBJECTIVE:   Chief complaint:  Anemia.     Pt in midst of pleurex catheter drainage process  OBJECTIVE:         Vital signs in last 24 hours:    Temp:  [97.6 F (36.4 C)-98.2 F (36.8 C)] 97.6 F (36.4 C) (02/18 0820) Pulse Rate:  [97-105] 100 (02/18 0820) Resp:  [18] 18 (02/18 0820) BP: (91-92)/(42-60) 92/60 (02/18 0820) SpO2:  [95 %-98 %] 98 % (02/18 0820) Last BM Date: 11/11/18 Filed Weights   11/08/18 2048 11/09/18 2103 11/10/18 2122  Weight: 112.4 kg 112.2 kg 112 kg   Not reexamined.  Pt looks ill.  She was distressed by drainage process  Intake/Output from previous day: 02/17 0701 - 02/18 0700 In: 1000 [P.O.:600; I.V.:400] Out: 3310 [Urine:1800; Blood:10]  Intake/Output this shift: Total I/O In: 300 [P.O.:300] Out: 500 [Urine:500]  Lab Results: Recent Labs    11/10/18 0404 11/11/18 0020 11/12/18 0447  WBC 6.3 7.4 7.3  HGB 7.2* 8.2* 7.8*  HCT 23.8* 27.4* 25.5*  PLT 147* 151 137*   BMET Recent Labs    11/10/18 0404 11/11/18 0020 11/12/18 0447  NA 136 136 136  K 3.3* 3.6 3.5  CL 92* 97* 95*  CO2 31 27 28   GLUCOSE 120* 126* 106*  BUN 136* 123* 110*  CREATININE 3.04* 2.95* 2.79*  CALCIUM 8.5* 8.7* 8.6*   LFT Recent Labs    11/10/18 0404  PROT 5.6*  ALBUMIN 2.8*  AST 19  ALT 14  ALKPHOS 41  BILITOT 1.1   PT/INR Recent Labs    11/10/18 0404  LABPROT 15.0  INR 1.19   Hepatitis Panel No results for input(s): HEPBSAG, HCVAB, HEPAIGM, HEPBIGM in the last 72 hours.  Studies/Results: Dg Chest Port 1 View  Result Date: 11/11/2018 CLINICAL DATA:  Right-sided PleurX catheter drain placed per EXAM: PORTABLE CHEST 1 VIEW COMPARISON:  11/08/2018 and chest CT 11/08/2018 FINDINGS: Left-sided pacemaker unchanged. Right-sided PleurX drainage catheter in place. Evidence of small right pleural effusion with significant interval  improvement. Left lung is clear. Stable cardiomegaly. Remainder of the exam is unchanged. IMPRESSION: Interval improvement in right pleural effusion with small residual effusion. Right PleurX drainage catheter in place. Stable cardiomegaly. Electronically Signed   By: Marin Olp M.D.   On: 11/11/2018 11:17   Dg C-arm 1-60 Min-no Report  Result Date: 11/11/2018 Fluoroscopy was utilized by the requesting physician.  No radiographic interpretation.    ASSESMENT:   *  Acute on chronic anemia.  Heme positive stools. Recent EGD 09/26/2018 and small bowel capsule endoscopy 10/11/2018 did not completely explain her anemia.  Patient shall reluctant to pursue colonoscopy but now agreeable.  *   NASH cirrhosis.  Followed by Dr. Oneida Alar in [place].  *   CHF.  S/p Pleurx catheter placement 11/11/2018 to address pleural effusion  *    CKD 4   PLAN   *   We do not have documented evidence that she has small bowel AVMs so not clear measures such as Depo octreotide will be of benefit.   Recommend hematology consult for specific recommendations for management of her anemia.  They could guide therapies such as Aranesp, Feraheme, other.   Until her respiratory status improves, she is too high risk for colonoscopy.    Azucena Freed  11/12/2018, 12:17 PM Phone [phone]    Attending Physician  Note   I have taken an interval history, reviewed the chart and examined the patient. I agree with the Advanced Practitioner's note, impression and recommendations. Recommend hematology consult for anemia management. SB or colonic AVMs have not been diagnosed so no indication for empiric octreotide at this time. Perhaps Aranesp, Feraheme other measures under guidance of hematology would be helpful. Colonoscopy can be reconsidered is she improves.   Lucio Edward, MD FACG [phone]

## 2018-11-12 NOTE — Progress Notes (Addendum)
Patient ID: [name] [name], female   DOB: November 30, 1945, 73 y.o.   MRN: [id number]     Advanced Heart Failure Rounding Note  PCP-Cardiologist: Dorris Carnes, MD   Subjective:    Admitted with hgb 5.2 and chest pain. Pleurex cath placed 2/17 with 1.5 liters removed.    Hgb 8.2>7.8  Creatinine 2.9>2.7   Remains SOB with exertion but says this is her baseline.    Objective:   Weight Range: 112 kg Body mass index is 39.85 kg/m.   Vital Signs:   Temp:  [97.6 F (36.4 C)-98.6 F (37 C)] 97.6 F (36.4 C) (02/18 0820) Pulse Rate:  [97-105] 100 (02/18 0820) Resp:  [18-22] 18 (02/18 0820) BP: (91-103)/(42-60) 92/60 (02/18 0820) SpO2:  [93 %-100 %] 98 % (02/18 0820) Last BM Date: 11/11/18  Weight change: Filed Weights   11/08/18 2048 11/09/18 2103 11/10/18 2122  Weight: 112.4 kg 112.2 kg 112 kg   Intake/Output:   Intake/Output Summary (Last 24 hours) at 11/12/2018 0912 Last data filed at 11/12/2018 0848 Gross per 24 hour  Intake 999.98 ml  Output 2200 ml  Net -1200.02 ml    Physical Exam    General:  Appears chronically ill. No resp difficulty HEENT: normal Neck: supple. JVP jaw   Carotids 2+ bilat; no bruits. No lymphadenopathy or thryomegaly appreciated. Cor: PMI nondisplaced. Regular rate & rhythm. No rubs, gallops or murmurs. Lungs: Decreased on the right. R pleur ex catheter Abdomen: obese, soft, nontender, nondistended. No hepatosplenomegaly. No bruits or masses. Good bowel sounds. Extremities: no cyanosis, clubbing, rash, R and LLE chronic hyperpigmentation.  RUE edema.  Neuro: alert & orientedx3, cranial nerves grossly intact. moves all 4 extremities w/o difficulty. Affect pleasant   Telemetry   V paced 90-100s A fib   EKG    None  Labs    CBC Recent Labs    11/11/18 0020 11/12/18 0447  WBC 7.4 7.3  HGB 8.2* 7.8*  HCT 27.4* 25.5*  MCV 94.5 95.9  PLT 151 315*   Basic Metabolic Panel Recent Labs    11/11/18 0020 11/12/18 0447  NA 136 136  K  3.6 3.5  CL 97* 95*  CO2 27 28  GLUCOSE 126* 106*  BUN 123* 110*  CREATININE 2.95* 2.79*  CALCIUM 8.7* 8.6*   Liver Function Tests Recent Labs    11/10/18 0404  AST 19  ALT 14  ALKPHOS 41  BILITOT 1.1  PROT 5.6*  ALBUMIN 2.8*   No results for input(s): LIPASE, AMYLASE in the last 72 hours. Cardiac Enzymes No results for input(s): CKTOTAL, CKMB, CKMBINDEX, TROPONINI in the last 72 hours.  BNP: BNP (last 3 results) Recent Labs    06/20/18 2249 10/06/18 1309 11/05/18 2224  BNP 160.6* 214.8* 228.9*    ProBNP (last 3 results) No results for input(s): PROBNP in the last 8760 hours.   D-Dimer No results for input(s): DDIMER in the last 72 hours. Hemoglobin A1C No results for input(s): HGBA1C in the last 72 hours. Fasting Lipid Panel No results for input(s): CHOL, HDL, LDLCALC, TRIG, CHOLHDL, LDLDIRECT in the last 72 hours. Thyroid Function Tests Recent Labs    11/10/18 0404  TSH 2.980    Other results:   Imaging    Dg Chest Port 1 View  Result Date: 11/11/2018 CLINICAL DATA:  Right-sided PleurX catheter drain placed per EXAM: PORTABLE CHEST 1 VIEW COMPARISON:  11/08/2018 and chest CT 11/08/2018 FINDINGS: Left-sided pacemaker unchanged. Right-sided PleurX drainage catheter in place. Evidence of small  right pleural effusion with significant interval improvement. Left lung is clear. Stable cardiomegaly. Remainder of the exam is unchanged. IMPRESSION: Interval improvement in right pleural effusion with small residual effusion. Right PleurX drainage catheter in place. Stable cardiomegaly. Electronically Signed   By: Marin Olp M.D.   On: 11/11/2018 11:17     Medications:     Scheduled Medications: . sodium chloride   Intravenous Once  . acetaminophen  1,000 mg Oral Q6H   Or  . acetaminophen (TYLENOL) oral liquid 160 mg/5 mL  1,000 mg Oral Q6H  . allopurinol  300 mg Oral QPM  . bisacodyl  10 mg Oral Daily  . calcitRIOL  0.25 mcg Oral QODAY  . isosorbide  mononitrate  15 mg Oral QHS  . multivitamin with minerals  1 tablet Oral QHS  . pantoprazole  40 mg Oral BID  . senna-docusate  1 tablet Oral QHS  . spironolactone  12.5 mg Oral Daily  . vitamin B-12  500 mcg Oral Daily    Infusions: . sodium chloride 10 mL/hr at 11/11/18 1643  . sodium chloride      PRN Medications: docusate sodium, HYDROcodone-acetaminophen, methocarbamol, ondansetron (ZOFRAN) IV   Patient Profile   73 y/o woman well known to me from [facility] Clinic. She has multiple medical problems including endstage biventricular HF, severe MR/TR, CKD IV, chronic anemia with recurrent GI bleeding. Recently admitted wit refractory HF, anemia, AKI and R pleural effusion which was tapped. EGD and capsule study negative.  Now readmitted with hgb 5.2 and CP. No overt bleeding.  Assessment/Plan   1. Chest Pain, in the setting of anemia.   - Resolved. Troponin 0.05>0.6.  - No further.  - Not candidate for cath with AKI and bleeding  2. Symptomatic Anemia  - Hgb 5.2 on admit. Overall transfused  4U PRBCs. - GI consulted. No plans for scope. GI recommended follow up with outpatient GI.  -On PPI   - EGD and capsule negative 1/20 - Hgb down from 8.2>7.8 - Could consider tagged RBC scan if hgb dropping again.    3. A/C combined systolic/diastolic heart failure  - ECHO 06/21/2018 EF 40-45% LV severely dilated.  Mild LVH, akinesis of mid-apicalanteroseptal and apical myocardium, mild AS, severe MR, LA and RA severely dilated, moderate TR, PA peak pressure 42 mmHg. [place] 10/19 showed preserved cardiac output with markedly elevated right and left heart filling pressures (very prominent RV failure). TEE looked like restrictive cardiomyopathy. Repeat echo in 1/20 with EF 30-35% (lower), wall motion abnormalities, moderate MR, moderately dilated RV, moderate TR, PASP 59 mmHg.  Cause of cardiomyopathy uncertain, chronic RV pacing may play a role.  - Volume status still elevated. Give another dose of  IV lasix.  Restart torsemide 80 mg twice a day tomorrow.  -Check BMET  In am.   4. Pleural Effusion, Right - Noted large right pleural effusion on CXR.  - s/p R pleurx cath this am by Dr. Prescott Gum with 1.5 L out.   5. CKD Stage IV - Creatinine baseline ~2.5  - Creatinine down to 2.79 BUN down to 110.  Follow BMET daily.   6. A fib S/P AV nodal ablation and PPM  - Chronic RV pacing in the 90s. - She has refused anticoagulation in the past and now also has chronic GI bleeding.  - No change.   7. Hypokalemia - K 3.5. Supp K.   - Added spironolactone while in house only. (Follow closely with Cr > 2.5.) Will not  discharge on.   8. DNR/DNI    9. RUE IV infiltrate - Elevate with warm compress. No skin breakdown currently.    Length of Stay: 6  Amy Clegg, NP  11/12/2018, 9:12 AM  Advanced Heart Failure Team Pager [phone] (M-F; [place])  Please contact [facility] for night-coverage after hours (4p -7a ) and weekends on [email or web address]  Patient seen and examined with the above-signed Advanced Practice Provider and/or Housestaff. I personally reviewed laboratory data, imaging studies and relevant notes. I independently examined the patient and formulated the important aspects of the plan. I have edited the note to reflect any of my changes or salient points. I have personally discussed the plan with the patient and/or family.  Volume status remains elevated.  Creatinine stable. Will increase IV lasix to 120 IV bid for today. Breathing better after R Pleurex. Hgb continues to drift down. Not candidate for colonoscopy. Situation c/b RBC antibodies making transfusion difficult. I spoke with GI this am. Bleeding scan felt to be unhelpful given that bleed is likely too slow. We discussed possible octreotide. They will revisit this with her.   Glori Bickers, MD  11:20 AM

## 2018-11-13 ENCOUNTER — Ambulatory Visit: Payer: Medicare Other | Admitting: Gastroenterology

## 2018-11-13 DIAGNOSIS — K922 Gastrointestinal hemorrhage, unspecified: Secondary | ICD-10-CM

## 2018-11-13 LAB — CBC
HEMATOCRIT: 27.2 % — AB (ref 36.0–46.0)
Hemoglobin: 8.4 g/dL — ABNORMAL LOW (ref 12.0–15.0)
MCH: 29.8 pg (ref 26.0–34.0)
MCHC: 30.9 g/dL (ref 30.0–36.0)
MCV: 96.5 fL (ref 80.0–100.0)
Platelets: 144 10*3/uL — ABNORMAL LOW (ref 150–400)
RBC: 2.82 MIL/uL — ABNORMAL LOW (ref 3.87–5.11)
RDW: 19.8 % — ABNORMAL HIGH (ref 11.5–15.5)
WBC: 7.5 10*3/uL (ref 4.0–10.5)
nRBC: 0 % (ref 0.0–0.2)

## 2018-11-13 LAB — TYPE AND SCREEN
ABO/RH(D): O POS
Antibody Screen: POSITIVE
Unit division: 0
Unit division: 0
Unit division: 0

## 2018-11-13 LAB — BASIC METABOLIC PANEL
Anion gap: 12 (ref 5–15)
BUN: 102 mg/dL — ABNORMAL HIGH (ref 8–23)
CHLORIDE: 94 mmol/L — AB (ref 98–111)
CO2: 30 mmol/L (ref 22–32)
Calcium: 8.8 mg/dL — ABNORMAL LOW (ref 8.9–10.3)
Creatinine, Ser: 2.66 mg/dL — ABNORMAL HIGH (ref 0.44–1.00)
GFR calc Af Amer: 20 mL/min — ABNORMAL LOW (ref >60)
GFR calc non Af Amer: 17 mL/min — ABNORMAL LOW (ref >60)
Glucose, Bld: 114 mg/dL — ABNORMAL HIGH (ref 70–99)
Potassium: 3.5 mmol/L (ref 3.5–5.1)
Sodium: 136 mmol/L (ref 135–145)

## 2018-11-13 LAB — BPAM RBC
Blood Product Expiration Date: 202003182359
Blood Product Expiration Date: 202003202359
Blood Product Expiration Date: 202003202359
ISSUE DATE / TIME: 202002161844
Unit Type and Rh: 5100
Unit Type and Rh: 5100
Unit Type and Rh: 5100

## 2018-11-13 MED ORDER — DARBEPOETIN ALFA 100 MCG/0.5ML IJ SOSY
100.0000 ug | PREFILLED_SYRINGE | Freq: Once | INTRAMUSCULAR | Status: AC
  Administered 2018-11-13: 100 ug via SUBCUTANEOUS
  Filled 2018-11-13 (×2): qty 0.5

## 2018-11-13 MED ORDER — HYDROCODONE-ACETAMINOPHEN 10-325 MG PO TABS
1.0000 | ORAL_TABLET | Freq: Three times a day (TID) | ORAL | Status: DC | PRN
  Administered 2018-11-13 – 2018-11-16 (×7): 1 via ORAL
  Filled 2018-11-13 (×7): qty 1

## 2018-11-13 MED ORDER — TORSEMIDE 20 MG PO TABS
80.0000 mg | ORAL_TABLET | Freq: Two times a day (BID) | ORAL | Status: DC
  Administered 2018-11-13 – 2018-11-15 (×5): 80 mg via ORAL
  Filled 2018-11-13 (×6): qty 4

## 2018-11-13 MED ORDER — POTASSIUM CHLORIDE CRYS ER 20 MEQ PO TBCR
40.0000 meq | EXTENDED_RELEASE_TABLET | Freq: Once | ORAL | Status: AC
  Administered 2018-11-13: 40 meq via ORAL
  Filled 2018-11-13: qty 2

## 2018-11-13 NOTE — Progress Notes (Signed)
Patient found with home CPAP in use

## 2018-11-13 NOTE — Progress Notes (Signed)
Pt has home CPAP unit.  Pt stated she doesn't need assistance with it.

## 2018-11-13 NOTE — Telephone Encounter (Signed)
REVIEWED. AGREE. NO ADDITIONAL RECOMMENDATIONS. 

## 2018-11-13 NOTE — Progress Notes (Addendum)
Patient ID: [name] [name], female   DOB: 04/19/1946, 73 y.o.   MRN: [id number]     Advanced Heart Failure Rounding Note  PCP-Cardiologist: Dorris Carnes, MD   Subjective:    Admitted with hgb 5.2 and chest pain. Pleurex cath placed 2/17 with 1.5 liters removed.    Hgb 8.2>7.8 >8.4 Creatinine 2.9>2.7 >2.6  Feeling much better today. Mild dyspnea with exertion.   Objective:   Weight Range: 116.8 kg Body mass index is 41.56 kg/m.   Vital Signs:   Temp:  [98 F (36.7 C)-98.3 F (36.8 C)] 98.1 F (36.7 C) (02/19 0918) Pulse Rate:  [89-101] 100 (02/19 0918) Resp:  [18-20] 18 (02/19 0918) BP: (80-109)/(46-62) 105/59 (02/19 0918) SpO2:  [96 %-98 %] 98 % (02/19 0918) Weight:  [116.8 kg] 116.8 kg (02/18 2013) Last BM Date: 11/11/18  Weight change: Filed Weights   11/09/18 2103 11/10/18 2122 11/12/18 2013  Weight: 112.2 kg 112 kg 116.8 kg   Intake/Output:   Intake/Output Summary (Last 24 hours) at 11/13/2018 1016 Last data filed at 11/13/2018 0900 Gross per 24 hour  Intake 820 ml  Output 1100 ml  Net -280 ml    Physical Exam   General:  Appears chronically ill.  No resp difficulty HEENT: normal Neck: supple. JVP 7-8  Carotids 2+ bilat; no bruits. No lymphadenopathy or thryomegaly appreciated. Cor: PMI nondisplaced. Regular rate & rhythm. No rubs, gallops or murmurs. Lungs: clear Abdomen: soft, nontender, nondistended. No hepatosplenomegaly. No bruits or masses. Good bowel sounds. Extremities: no cyanosis, clubbing, rash, R and LLE hyperpigmentation. RUE edema.  Neuro: alert & orientedx3, cranial nerves grossly intact. moves all 4 extremities w/o difficulty. Affect pleasant    Telemetry   V paced 90-100s a fib.   EKG    None  Labs    CBC Recent Labs    11/12/18 0447 11/13/18 0408  WBC 7.3 7.5  HGB 7.8* 8.4*  HCT 25.5* 27.2*  MCV 95.9 96.5  PLT 137* 696*   Basic Metabolic Panel Recent Labs    11/12/18 0447 11/13/18 0408  NA 136 136  K 3.5 3.5    CL 95* 94*  CO2 28 30  GLUCOSE 106* 114*  BUN 110* 102*  CREATININE 2.79* 2.66*  CALCIUM 8.6* 8.8*   Liver Function Tests No results for input(s): AST, ALT, ALKPHOS, BILITOT, PROT, ALBUMIN in the last 72 hours. No results for input(s): LIPASE, AMYLASE in the last 72 hours. Cardiac Enzymes No results for input(s): CKTOTAL, CKMB, CKMBINDEX, TROPONINI in the last 72 hours.  BNP: BNP (last 3 results) Recent Labs    06/20/18 2249 10/06/18 1309 11/05/18 2224  BNP 160.6* 214.8* 228.9*    ProBNP (last 3 results) No results for input(s): PROBNP in the last 8760 hours.   D-Dimer No results for input(s): DDIMER in the last 72 hours. Hemoglobin A1C No results for input(s): HGBA1C in the last 72 hours. Fasting Lipid Panel No results for input(s): CHOL, HDL, LDLCALC, TRIG, CHOLHDL, LDLDIRECT in the last 72 hours. Thyroid Function Tests No results for input(s): TSH, T4TOTAL, T3FREE, THYROIDAB in the last 72 hours.  Invalid input(s): FREET3  Other results:   Imaging    No results found.   Medications:     Scheduled Medications: . sodium chloride   Intravenous Once  . acetaminophen  1,000 mg Oral Q6H   Or  . acetaminophen (TYLENOL) oral liquid 160 mg/5 mL  1,000 mg Oral Q6H  . allopurinol  300 mg Oral QPM  .  bisacodyl  10 mg Oral Daily  . calcitRIOL  0.25 mcg Oral QODAY  . isosorbide mononitrate  15 mg Oral QHS  . multivitamin with minerals  1 tablet Oral QHS  . pantoprazole  40 mg Oral BID  . senna-docusate  1 tablet Oral QHS  . spironolactone  12.5 mg Oral Daily  . vitamin B-12  500 mcg Oral Daily    Infusions: . sodium chloride 10 mL/hr at 11/11/18 1643  . sodium chloride      PRN Medications: docusate sodium, HYDROcodone-acetaminophen, methocarbamol, ondansetron (ZOFRAN) IV   Patient Profile   73 y/o woman well known to me from HF Clinic. She has multiple medical problems including endstage biventricular HF, severe MR/TR, CKD IV, chronic anemia with  recurrent GI bleeding. Recently admitted wit refractory HF, anemia, AKI and R pleural effusion which was tapped. EGD and capsule study negative.  Now readmitted with hgb 5.2 and CP. No overt bleeding.  Assessment/Plan   1. Chest Pain, in the setting of anemia.   - Resolved. Troponin 0.05>0.6.  - No further.  - Not candidate for cath with AKI and bleeding  2. Symptomatic Anemia  - Hgb 5.2 on admit. Overall transfused  4U PRBCs. - GI consulted. No plans for scope. GI recommended follow up with outpatient GI.  -On PPI   - EGD and capsule negative 1/20 - Hgb stable at 8.4. - Could consider tagged RBC scan if hgb dropping again.    3. A/C combined systolic/diastolic heart failure  - ECHO 06/21/2018 EF 40-45% LV severely dilated.  Mild LVH, akinesis of mid-apicalanteroseptal and apical myocardium, mild AS, severe MR, LA and RA severely dilated, moderate TR, PA peak pressure 42 mmHg. [place] 10/19 showed preserved cardiac output with markedly elevated right and left heart filling pressures (very prominent RV failure). TEE looked like restrictive cardiomyopathy. Repeat echo in 1/20 with EF 30-35% (lower), wall motion abnormalities, moderate MR, moderately dilated RV, moderate TR, PASP 59 mmHg.  Cause of cardiomyopathy uncertain, chronic RV pacing may play a role.  - Volume status improved despite weight gain. She has not had a standing weight.  - Switch to torsemide today.  -Renal function stable.   4. Pleural Effusion, Right - Noted large right pleural effusion on CXR.  - s/p R pleurx cath this am by Dr. Prescott Gum with 1.5 L Still with >1 liter drained 2/18.  - Needs Rehab Facility that can manage.   5. CKD Stage IV - Creatinine baseline ~2.5  - Creatinine down to 2.66 BUN down to 102  Follow BMET daily.  - Nephrology following.   6. A fib S/P AV nodal ablation and PPM  - Chronic RV pacing in the 90s. - She has refused anticoagulation in the past and now also has chronic GI bleeding.  -  No change.   7. Hypokalemia - K remains 3.5. Supp K.    - Added spironolactone while in house only. (Follow closely with Cr > 2.5.) Will not discharge on.   8. DNR/DNI    9. RUE IV infiltrate - Elevate with warm compress. No skin breakdown currently.    Length of Stay: 7  Amy Clegg, NP  11/13/2018, 10:16 AM  Advanced Heart Failure Team Pager [phone] (M-F; [place])  Please contact [facility] for night-coverage after hours (4p -7a ) and weekends on [email or web address]  Patient seen and examined with the above-signed Advanced Practice Provider and/or Housestaff. I personally reviewed laboratory data, imaging studies and relevant notes. I  independently examined the patient and formulated the important aspects of the plan. I have edited the note to reflect any of my changes or salient points. I have personally discussed the plan with the patient and/or family.  Volume status improved with IV lasix. Breathing better with Pleurex and continues to drain. Renal function stable. We will resume home torsemide and signoff. We will arrange f/u in HF Clinic. Discussed with GI and Renal teams personally.   Glori Bickers, MD  4:01 PM

## 2018-11-13 NOTE — Clinical Social Work Note (Signed)
Clinical Social Work Assessment  Patient Details  Name: [name] [name] MRN: [id number] Date of Birth: 02-16-1946  Date of referral:  11/13/18               Reason for consult:  Discharge Planning, Facility Placement                Permission sought to share information with:  Family Supports Permission granted to share information::  Yes, Verbal Permission Granted  Name::     Caitlynne Harbeck and [place]::     Relationship::  Marcie Bal - daughter and Venida Jarvis, Janet's wife  Contact Information:  Marcie Bal [phone]; Sherri [phone]  Housing/Transportation Living arrangements for the past 2 months:  [place] of Information:  Patient, Other (Comment Required)(Sherri Hammock - dau-in-law) Patient Interpreter Needed:  None Criminal Activity/Legal Involvement Pertinent to Current Situation/Hospitalization:  No - Comment as needed Significant Relationships:  Adult Children, Other Family Members Lives with:  Adult Children Do you feel safe going back to the place where you live?  Yes(Patient feels safe at home as she lives on the lower level of her daughter's 2-level home; patient agreeable to [facility] rehab before going home) Need for family participation in patient care:  Yes (Comment)  Care giving concerns:  Patient indicated that her daughter and her wife work. Patient in agreement with ST rehab before retuning home.  Social Worker assessment / plan: CSW talked with Ms. Rapaport at the bedside. Patient was sitting up in bed and was alert, oriented, and willing to talk with CSW regarding her discharge plan. CSW explained reason for visit and was advised that her daughter was looking into a facility for her for ST rehab. Patient reported that they live in [place], [place] and her daughter Marcie Bal is an Radio producer with [employer] and her daughter-in-law Venida Jarvis is a cardiologist. CSW advised that Marcie Bal and Venida Jarvis have custody of her nephews  child.   Ms.  Holten gave CSW permission to contact Sherri and CSW advised by Venida Jarvis that she has talked with staff at [facility] in [place] and they can care for the plurex (which is her main concern). She has contacted other facilities, but has not heard back from any other facilities yet. Sherri was provided with CSW's contact information and she will keep CSW informed.   Employment status:  Retired Forensic scientist:  Information systems manager, [employer]) PT Recommendations:  [place] / Referral to community resources:  [place] [facility](None provided as daughter looking into skilled nursing facilities in [place])  Patient/Family's Response to care:  No concerns expressed by patient or Sherri regarding patient's care during hospitalization.  Patient/Family's Understanding of and Emotional Response to Diagnosis, Current Treatment, and Prognosis: Patient and Sherri expressed understanding of patient's current medical status and her need for ST rehab and a facility that can care for the plurex drain.  Emotional Assessment Appearance:  Appears stated age Attitude/Demeanor/Rapport:  Engaged Affect (typically observed):  Pleasant, Appropriate Orientation:  Oriented to Self, Oriented to Place, Oriented to  Time, Oriented to Situation Alcohol / Substance use:  Tobacco Use, Alcohol Use, Illicit Drugs(Patient reported that she quit smoking and does not drink alcohol or use illicit drugs) Psych involvement (Current and /or in the community):  No (Comment)  Discharge Needs  Concerns to be addressed:  Discharge Planning Concerns Readmission within the last 30 days:  No Current discharge risk:  None Barriers to Discharge:  Continued Medical  Work up, No SNF bed   Sable Feil, LCSW 11/13/2018, 5:57 PM

## 2018-11-13 NOTE — Progress Notes (Signed)
PROGRESS NOTE  [name] [name] HFW:[id number] DOB: Oct 02, 1945 DOA: 11/05/2018 PCP: [name], MD   LOS: 7 days   Brief Narrative / Interim history: 73 year old female with history of anemia, paroxysmal A. 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  She was admitted to the hospital, gastroenterology, nephrology as well as cardiology were consulted  Subjective: She is feeling little bit better, less shortness of breath.  Denies any chest pain, she denies abdominal pain, no nausea or vomiting  Assessment & Plan: Principal Problem:   Symptomatic anemia Active Problems:   Chronic combined systolic and diastolic CHF (congestive heart failure) (HCC)   Chest pain   AKI (acute kidney injury) (HCC)   Hypokalemia   Severe anemia   Acute on chronic systolic CHF (congestive heart failure) (HCC)   Heme positive stool   Principal Problem Symptomatic anemia -Likely multifactorial in the setting of acute blood loss from slow GI bleeding as well as underlying chronic kidney disease stage IV -She received total of 4 units of packed red blood cells with improvement in her hemoglobin from 5.2 into the 8 range.  She received IV iron -She had recent extensive work-up including EGD as well as capsule endoscopy and they were unremarkable.  She was seen by gastroenterology and deemed not a good candidate for colonoscopy due to her respiratory status as well as heart failure -Discussed with GI today, they also recommend outpatient follow-up with hematology to assist longitudinally  Active Problems Acute on chronic combined systolic and diastolic CHF with persistent pleural effusion with multi  valvular disease -Last LVEF was 30% in January 2020, echo also showed mild left ear, severe MR, moderate TR with PA peak pressure 42 mmHg -Unclear cause of cardiomyopathy?  Chronic RV pacing, advanced heart failure following and appreciate input. -She has this persistent right-sided pleural effusion for which thoracic surgery was consulted and placed a right Pleurx drain  Chronic kidney disease stage IV -Baseline creatinine around 2.5, currently approaching baseline, nephrology following.  A. fib status post AV nodal ablation and PPM -Chronic pacing  OSA -Continue nightly CPAP  Liver cirrhosis -Platelets overall stable   Scheduled Meds: . sodium chloride   Intravenous Once  . acetaminophen  1,000 mg Oral Q6H   Or  . acetaminophen (TYLENOL) oral liquid 160 mg/5 mL  1,000 mg Oral Q6H  . allopurinol  300 mg Oral QPM  . bisacodyl  10 mg Oral Daily  . calcitRIOL  0.25 mcg Oral QODAY  . isosorbide mononitrate  15 mg Oral QHS  . multivitamin with minerals  1 tablet Oral QHS  . pantoprazole  40 mg Oral BID  . senna-docusate  1 tablet Oral QHS  . spironolactone  12.5 mg Oral Daily  . torsemide  80 mg Oral BID  . vitamin B-12  500 mcg Oral Daily   Continuous Infusions: . sodium chloride 10 mL/hr at 11/11/18 1643  . sodium chloride     PRN Meds:.docusate sodium, HYDROcodone-acetaminophen, methocarbamol, ondansetron (ZOFRAN) IV  DVT prophylaxis: SCDs Code Status: DNR Family Communication: No family at bedside Disposition Plan: SNF when cleared by consultants  Consultants:   Gastroenterology  Nephrology  Thoracic surgery  Cardiology  Procedures:   None   Antimicrobials:  None  Objective: Vitals:   11/12/18 1808 11/12/18 2013 11/13/18 0405 11/13/18 0918  BP: (!) 105/48 (!) 80/46 109/62 (!) 105/59  Pulse: (!) 101 89 98 100  Resp: 18 20 19 18   Temp:  98.3 F (36.8 C) 98 F (36.7 C) 98.1 F (36.7 C)  TempSrc:  Oral Oral Oral  SpO2: 97% 97% 96% 98%  Weight:   116.8 kg    Height:        Intake/Output Summary (Last 24 hours) at 11/13/2018 1139 Last data filed at 11/13/2018 1048 Gross per 24 hour  Intake 1120 ml  Output 1000 ml  Net 120 ml   Filed Weights   11/09/18 2103 11/10/18 2122 11/12/18 2013  Weight: 112.2 kg 112 kg 116.8 kg    Examination:  Constitutional: NAD Eyes: PERRL, lids and conjunctivae normal ENMT: Mucous membranes are moist.  Respiratory: clear to auscultation bilaterally, no wheezing, faint bibasilar crackles Cardiovascular: Regular rate and rhythm, 3/6 SEM.  Trace LE edema Abdomen: no tenderness. Bowel sounds positive.  Musculoskeletal: no clubbing / cyanosis.  Skin: no rashes, chronic venous stasis changes bilateral lower extremities Neurologic: CN 2-12 grossly intact. Strength 5/5 in all 4.  Psychiatric: Normal judgment and insight. Alert and oriented x 3. Normal mood.    Data Reviewed: I have independently reviewed following labs and imaging studies   CBC: Recent Labs  Lab 11/09/18 0835 11/10/18 0404 11/11/18 0020 11/12/18 0447 11/13/18 0408  WBC 6.7 6.3 7.4 7.3 7.5  NEUTROABS 5.4  --   --   --   --   HGB 7.7* 7.2* 8.2* 7.8* 8.4*  HCT 25.7* 23.8* 27.4* 25.5* 27.2*  MCV 93.8 94.4 94.5 95.9 96.5  PLT 158 147* 151 137* 384*   Basic Metabolic Panel: Recent Labs  Lab 11/08/18 0829  11/09/18 1650 11/10/18 0404 11/11/18 0020 11/12/18 0447 11/13/18 0408  NA  --    < > 135 136 136 136 136  K  --    < > 3.3* 3.3* 3.6 3.5 3.5  CL  --    < > 92* 92* 97* 95* 94*  CO2  --    < > 32 31 27 28 30   GLUCOSE  --    < > 141* 120* 126* 106* 114*  BUN  --    < > 140* 136* 123* 110* 102*  CREATININE  --    < > 3.04* 3.04* 2.95* 2.79* 2.66*  CALCIUM  --    < > 8.8* 8.5* 8.7* 8.6* 8.8*  MG 3.0*  --   --   --   --   --   --    < > = values in this interval not displayed.   GFR: Estimated Creatinine Clearance: 24.5 mL/min (A) (by C-G formula based on SCr of 2.66 mg/dL (H)). Liver Function Tests: Recent Labs    Lab 11/10/18 0404  AST 19  ALT 14  ALKPHOS 41  BILITOT 1.1  PROT 5.6*  ALBUMIN 2.8*   No results for input(s): LIPASE, AMYLASE in the last 168 hours. No results for input(s): AMMONIA in the last 168 hours. Coagulation Profile: Recent Labs  Lab 11/10/18 0404  INR 1.19   Cardiac Enzymes: No results for input(s): CKTOTAL, CKMB, CKMBINDEX, TROPONINI in the last 168 hours. BNP (last 3 results) No results for input(s): PROBNP in the last 8760 hours. HbA1C: No results for input(s): HGBA1C in the last 72 hours. CBG: No results for input(s): GLUCAP in the last 168 hours. Lipid Profile:  No results for input(s): CHOL, HDL, LDLCALC, TRIG, CHOLHDL, LDLDIRECT in the last 72 hours. Thyroid Function Tests: No results for input(s): TSH, T4TOTAL, FREET4, T3FREE, THYROIDAB in the last 72 hours. Anemia Panel: No results for input(s): VITAMINB12, FOLATE, FERRITIN, TIBC, IRON, RETICCTPCT in the last 72 hours. Urine analysis:    Component Value Date/Time   COLORURINE YELLOW 11/09/2018 [place] 11/09/2018 1653   LABSPEC 1.009 11/09/2018 1653   PHURINE 7.0 11/09/2018 1653   GLUCOSEU NEGATIVE 11/09/2018 1653   HGBUR NEGATIVE 11/09/2018 1653   BILIRUBINUR NEGATIVE 11/09/2018 1653   KETONESUR NEGATIVE 11/09/2018 1653   PROTEINUR NEGATIVE 11/09/2018 1653   UROBILINOGEN 0.2 03/06/2013 2035   NITRITE NEGATIVE 11/09/2018 1653   LEUKOCYTESUR SMALL (A) 11/09/2018 1653   Sepsis Labs: Invalid input(s): PROCALCITONIN, LACTICIDVEN  Recent Results (from the past 240 hour(s))  Surgical pcr screen     Status: None   Collection Time: 11/09/18 12:44 PM  Result Value Ref Range Status   MRSA, PCR NEGATIVE NEGATIVE Final   Staphylococcus aureus NEGATIVE NEGATIVE Final    Comment: (NOTE) The Xpert SA Assay (FDA approved for NASAL specimens in patients 41 years of age and older), is one component of a comprehensive surveillance program. It is not intended to diagnose infection nor  to guide or monitor treatment. Performed at [facility], [place] [address], [place], [place] [address]       Radiology Studies: No results found.   Marzetta Board, MD, PhD [facility]  Contact via  [email or web address]  [place] P: [phone]  F: [phone]

## 2018-11-13 NOTE — Progress Notes (Signed)
[facility] Progress Note   Subjective:      No interval events  More than 1.5 L urine output yesterday  Serum creatinine 2.66, BUN 102, K3.5, bicarbonate 30; hemoglobin 8.4  Looking to go to SNF short term  Objective Vitals:   11/12/18 1808 11/12/18 2013 11/13/18 0405 11/13/18 0918  BP: (!) 105/48 (!) 80/46 109/62 (!) 105/59  Pulse: (!) 101 89 98 100  Resp: 18 20 19 18   Temp:  98.3 F (36.8 C) 98 F (36.7 C) 98.1 F (36.7 C)  TempSrc:  Oral Oral Oral  SpO2: 97% 97% 96% 98%  Weight:  116.8 kg    Height:       Physical Exam General: Obese, chronically ill-appearing, elderly female Heart: irreg, syst murmur unchanged Lungs: dec BS bases R to about 2/3 up field, difficulty with complete sentences Abdomen: soft Extremities: 1+ edema with chronic changes  Additional Objective Labs: Basic Metabolic Panel: Recent Labs  Lab 11/11/18 0020 11/12/18 0447 11/13/18 0408  NA 136 136 136  K 3.6 3.5 3.5  CL 97* 95* 94*  CO2 27 28 30   GLUCOSE 126* 106* 114*  BUN 123* 110* 102*  CREATININE 2.95* 2.79* 2.66*  CALCIUM 8.7* 8.6* 8.8*   Liver Function Tests: Recent Labs  Lab 11/10/18 0404  AST 19  ALT 14  ALKPHOS 41  BILITOT 1.1  PROT 5.6*  ALBUMIN 2.8*   No results for input(s): LIPASE, AMYLASE in the last 168 hours. CBC: Recent Labs  Lab 11/09/18 0835 11/10/18 0404 11/11/18 0020 11/12/18 0447 11/13/18 0408  WBC 6.7 6.3 7.4 7.3 7.5  NEUTROABS 5.4  --   --   --   --   HGB 7.7* 7.2* 8.2* 7.8* 8.4*  HCT 25.7* 23.8* 27.4* 25.5* 27.2*  MCV 93.8 94.4 94.5 95.9 96.5  PLT 158 147* 151 137* 144*   Blood Culture    Component Value Date/Time   SDES URINE, RANDOM 10/06/2018 1256   SPECREQUEST NONE 10/06/2018 1256   CULT (A) 10/06/2018 1256    <10,000 COLONIES/mL INSIGNIFICANT GROWTH Performed at [facility] [address], [place], [place] [address]    REPTSTATUS 10/07/2018 FINAL 10/06/2018 1256    Cardiac Enzymes: Recent Labs  Lab  11/06/18 1112  TROPONINI 0.06*   CBG: No results for input(s): GLUCAP in the last 168 hours. Iron Studies: No results for input(s): IRON, TIBC, TRANSFERRIN, FERRITIN in the last 72 hours. @lablastinr3 @ Studies/Results: Dg Chest Port 1 View  Result Date: 11/11/2018 CLINICAL DATA:  Right-sided PleurX catheter drain placed per EXAM: PORTABLE CHEST 1 VIEW COMPARISON:  11/08/2018 and chest CT 11/08/2018 FINDINGS: Left-sided pacemaker unchanged. Right-sided PleurX drainage catheter in place. Evidence of small right pleural effusion with significant interval improvement. Left lung is clear. Stable cardiomegaly. Remainder of the exam is unchanged. IMPRESSION: Interval improvement in right pleural effusion with small residual effusion. Right PleurX drainage catheter in place. Stable cardiomegaly. Electronically Signed   By: Marin Olp M.D.   On: 11/11/2018 11:17   Medications: . sodium chloride 10 mL/hr at 11/11/18 1643  . sodium chloride     . sodium chloride   Intravenous Once  . acetaminophen  1,000 mg Oral Q6H   Or  . acetaminophen (TYLENOL) oral liquid 160 mg/5 mL  1,000 mg Oral Q6H  . allopurinol  300 mg Oral QPM  . bisacodyl  10 mg Oral Daily  . calcitRIOL  0.25 mcg Oral QODAY  . isosorbide mononitrate  15 mg Oral QHS  .  multivitamin with minerals  1 tablet Oral QHS  . pantoprazole  40 mg Oral BID  . senna-docusate  1 tablet Oral QHS  . spironolactone  12.5 mg Oral Daily  . vitamin B-12  500 mcg Oral Daily    Dialysis Orders:  Assessment/Plan:  1.  Anemia:  Presumed GI source.    Has required transfusions.  Hemoglobin stable..    Patient has declined colonoscopy.  GI following.  Will check iron stores and give ESA today, Aranesp 100 mcg  2.  CKD 4:  Creatinine within recent baseline currently.  Watch closely.   3.  H/o MR, TR, CHF: cardiology to consult while admitted.  She is near her baseline volume status.  Diuretic dosing per AHF.  Supplementing K as needed.   4.   Atrial fibrillation with AV node ablated and PP  5.  Pleural effusion: Status post pleural catheter 11/11/2018 dyspnea improved now on RA  [employer]

## 2018-11-14 LAB — BASIC METABOLIC PANEL
Anion gap: 13 (ref 5–15)
BUN: 96 mg/dL — ABNORMAL HIGH (ref 8–23)
CO2: 29 mmol/L (ref 22–32)
CREATININE: 2.58 mg/dL — AB (ref 0.44–1.00)
Calcium: 8.6 mg/dL — ABNORMAL LOW (ref 8.9–10.3)
Chloride: 93 mmol/L — ABNORMAL LOW (ref 98–111)
GFR calc non Af Amer: 18 mL/min — ABNORMAL LOW (ref >60)
GFR, EST AFRICAN AMERICAN: 21 mL/min — AB (ref >60)
Glucose, Bld: 94 mg/dL (ref 70–99)
Potassium: 4 mmol/L (ref 3.5–5.1)
Sodium: 135 mmol/L (ref 135–145)

## 2018-11-14 LAB — CBC
HEMATOCRIT: 26 % — AB (ref 36.0–46.0)
Hemoglobin: 8 g/dL — ABNORMAL LOW (ref 12.0–15.0)
MCH: 29.3 pg (ref 26.0–34.0)
MCHC: 30.8 g/dL (ref 30.0–36.0)
MCV: 95.2 fL (ref 80.0–100.0)
PLATELETS: 136 10*3/uL — AB (ref 150–400)
RBC: 2.73 MIL/uL — ABNORMAL LOW (ref 3.87–5.11)
RDW: 19.2 % — AB (ref 11.5–15.5)
WBC: 5.6 10*3/uL (ref 4.0–10.5)
nRBC: 0 % (ref 0.0–0.2)

## 2018-11-14 LAB — FERRITIN: Ferritin: 143 ng/mL (ref 11–307)

## 2018-11-14 LAB — IRON AND TIBC
Iron: 26 ug/dL — ABNORMAL LOW (ref 28–170)
Saturation Ratios: 8 % — ABNORMAL LOW (ref 10.4–31.8)
TIBC: 344 ug/dL (ref 250–450)
UIBC: 318 ug/dL

## 2018-11-14 MED ORDER — DIPHENHYDRAMINE HCL 50 MG/ML IJ SOLN
12.5000 mg | Freq: Three times a day (TID) | INTRAMUSCULAR | Status: DC | PRN
  Administered 2018-11-14: 12.5 mg via INTRAVENOUS
  Filled 2018-11-14: qty 1

## 2018-11-14 MED ORDER — FAMOTIDINE IN NACL 20-0.9 MG/50ML-% IV SOLN
20.0000 mg | INTRAVENOUS | Status: DC
  Administered 2018-11-14: 20 mg via INTRAVENOUS
  Filled 2018-11-14 (×2): qty 50

## 2018-11-14 MED ORDER — SODIUM CHLORIDE 0.9 % IV BOLUS
500.0000 mL | Freq: Once | INTRAVENOUS | Status: AC
  Administered 2018-11-14: 500 mL via INTRAVENOUS

## 2018-11-14 MED ORDER — SODIUM CHLORIDE 0.9 % IV SOLN
510.0000 mg | Freq: Once | INTRAVENOUS | Status: AC
  Administered 2018-11-14: 510 mg via INTRAVENOUS
  Filled 2018-11-14: qty 17

## 2018-11-14 NOTE — Progress Notes (Signed)
Patient is complaining of itching and tingling all over 10 min into iron infusion. iron stopped. Page to Dr. Renne Crigler for notification.

## 2018-11-14 NOTE — Progress Notes (Signed)
RT spoke with patient and the patient stated she was able to apply her home CPAP with no assistance.  Patient had sterile water at bedside. RN present.

## 2018-11-14 NOTE — Progress Notes (Signed)
Patient's BP 75/37 HR 92,Manual BP recheck 78/56 HR 104. Pt asymptomatic. MD on call text paged. Order received to give 500 cc NS bolus. Will continue to monitor. Shelia Magallon, Wonda Cheng, Therapist, sports

## 2018-11-14 NOTE — Care Management Important Message (Signed)
Important Message  Patient Details  Name: [name] [name] MRN: [id number] Date of Birth: Dec 12, 1945   Medicare Important Message Given:  Yes    Orbie Pyo 11/14/2018, 2:11 PM

## 2018-11-14 NOTE — Progress Notes (Signed)
Physical Therapy Treatment Patient Details Name: [name] [name] MRN: [id number] DOB: 1946-07-05 Today's Date: 11/14/2018    History of Present Illness Pt is a 73 y/o female admitted secondary to symptomatic anemia secondary to possible GI bleed. Pt initially refusing colonoscopy, however, now agreeable. Pt also found to have R pleural effusion and is s/p R pleurx drain on 11/11/2018. PMH includes CHF, HTN, sleep apnea, a fib, s/p pacemaker.     PT Comments    Pt progressing towards physical therapy goals. Session somewhat limited 2 pt willingness to participate. Max encouragement provided to progress mobility however pt had an excuse for every suggestion therapist had. OOB mobility limited to transfer to/from [facility] and pt was left sitting EOB to eat lunch. Will continue to follow and progress as able per POC.    Follow Up Recommendations  SNF;Supervision for mobility/OOB     Equipment Recommendations  None recommended by PT    Recommendations for Other Services       Precautions / Restrictions Precautions Precautions: Fall Restrictions Weight Bearing Restrictions: No    Mobility  Bed Mobility Overal bed mobility: Needs Assistance Bed Mobility: Supine to Sit     Supine to sit: Supervision;HOB elevated     General bed mobility comments: increased time and effort, cues for safety and log roll technique however pt continuing to sit straight up in bed.   Transfers Overall transfer level: Needs assistance Equipment used: None Transfers: Squat Pivot Transfers     Squat pivot transfers: Supervision     General transfer comment: Supervision per pt request - did not want therapist near her or touching her for assist. Pt was able to squat pivot from bed to/from [facility].   Ambulation/Gait                 Stairs             Wheelchair Mobility    Modified Rankin (Stroke Patients Only)       Balance Overall balance assessment: Needs  assistance Sitting-balance support: No upper extremity supported;Feet supported Sitting balance-Leahy Scale: Good     Standing balance support: Single extremity supported;During functional activity Standing balance-Leahy Scale: Poor Standing balance comment: Reliant on UE support                            Cognition Arousal/Alertness: Awake/alert Behavior During Therapy: WFL for tasks assessed/performed Overall Cognitive Status: No family/caregiver present to determine baseline cognitive functioning                                 General Comments: Easily annoyed      Exercises      General Comments        Pertinent Vitals/Pain Pain Assessment: Faces Faces Pain Scale: No hurt Pain Intervention(s): Monitored during session    Home Living                      Prior Function            PT Goals (current goals can now be found in the care plan section) Acute Rehab PT Goals Patient Stated Goal: get better PT Goal Formulation: With patient Time For Goal Achievement: 11/25/18 Potential to Achieve Goals: Good Progress towards PT goals: Progressing toward goals    Frequency    Min 2X/week      PT Plan Current  plan remains appropriate    Co-evaluation              AM-PAC PT "6 Clicks" Mobility   Outcome Measure  Help needed turning from your back to your side while in a flat bed without using bedrails?: A Lot Help needed moving from lying on your back to sitting on the side of a flat bed without using bedrails?: A Lot Help needed moving to and from a bed to a chair (including a wheelchair)?: A Lot Help needed standing up from a chair using your arms (e.g., wheelchair or bedside chair)?: A Lot Help needed to walk in hospital room?: A Lot Help needed climbing 3-5 steps with a railing? : A Lot 6 Click Score: 12    End of Session   Activity Tolerance: Patient tolerated treatment well Patient left: in bed;with call  bell/phone within reach;with bed alarm set Nurse Communication: Mobility status PT Visit Diagnosis: Unsteadiness on feet (R26.81);Muscle weakness (generalized) (M62.81);Pain Pain - Right/Left: Right Pain - part of body: (chest at incision site)     Time: 0413-6438 PT Time Calculation (min) (ACUTE ONLY): 21 min  Charges:  $Gait Training: 8-22 mins                     Rolinda Roan, PT, DPT Acute Rehabilitation Services Pager: [phone] Office: [phone]    Thelma Comp 11/14/2018, 12:49 PM

## 2018-11-14 NOTE — Progress Notes (Signed)
[facility] Progress Note   Subjective:      No interval events, breathing remains stable on room air  Greater than 0.9 L urine output yesterday  Weights stable today  Serum creatinine further improved to 2.58, BUN 96.  Hemoglobin 8, iron saturation 8%, ferritin 143.  Received 100 mcg of Aranesp 2/19  Looking to go to SNF short term  Objective Vitals:   11/13/18 1827 11/13/18 2122 11/14/18 0515 11/14/18 1001  BP: (!) 112/55 128/71 (!) 102/55 (!) 96/53  Pulse: 92 (!) 101 91 89  Resp: 18 18 18 20   Temp:  98.4 F (36.9 C) 98.2 F (36.8 C) 98.3 F (36.8 C)  TempSrc:  Oral Oral Oral  SpO2: 100% 95% 98% 96%  Weight:  116.7 kg    Height:       Physical Exam General: Obese, chronically ill-appearing, elderly female Heart: irreg, syst murmur unchanged Lungs: dec BS bases R to about 2/3 up field, difficulty with complete sentences Abdomen: soft Extremities: 1+ edema with chronic changes  Additional Objective Labs: Basic Metabolic Panel: Recent Labs  Lab 11/12/18 0447 11/13/18 0408 11/14/18 0530  NA 136 136 135  K 3.5 3.5 4.0  CL 95* 94* 93*  CO2 28 30 29   GLUCOSE 106* 114* 94  BUN 110* 102* 96*  CREATININE 2.79* 2.66* 2.58*  CALCIUM 8.6* 8.8* 8.6*   Liver Function Tests: Recent Labs  Lab 11/10/18 0404  AST 19  ALT 14  ALKPHOS 41  BILITOT 1.1  PROT 5.6*  ALBUMIN 2.8*   No results for input(s): LIPASE, AMYLASE in the last 168 hours. CBC: Recent Labs  Lab 11/09/18 0835 11/10/18 0404 11/11/18 0020 11/12/18 0447 11/13/18 0408 11/14/18 0530  WBC 6.7 6.3 7.4 7.3 7.5 5.6  NEUTROABS 5.4  --   --   --   --   --   HGB 7.7* 7.2* 8.2* 7.8* 8.4* 8.0*  HCT 25.7* 23.8* 27.4* 25.5* 27.2* 26.0*  MCV 93.8 94.4 94.5 95.9 96.5 95.2  PLT 158 147* 151 137* 144* 136*   Blood Culture    Component Value Date/Time   SDES URINE, RANDOM 10/06/2018 1256   SPECREQUEST NONE 10/06/2018 1256   CULT (A) 10/06/2018 1256    <10,000 COLONIES/mL  INSIGNIFICANT GROWTH Performed at [facility] [address], [place], [place] [address]    REPTSTATUS 10/07/2018 FINAL 10/06/2018 1256    Cardiac Enzymes: No results for input(s): CKTOTAL, CKMB, CKMBINDEX, TROPONINI in the last 168 hours. CBG: No results for input(s): GLUCAP in the last 168 hours. Iron Studies:  Recent Labs    11/14/18 0929  IRON 26*  TIBC 344  FERRITIN 143   @[email or web address] @ Studies/Results: No results found. Medications: . sodium chloride 10 mL/hr at 11/11/18 1643  . sodium chloride    . ferumoxytol     . sodium chloride   Intravenous Once  . acetaminophen  1,000 mg Oral Q6H   Or  . acetaminophen (TYLENOL) oral liquid 160 mg/5 mL  1,000 mg Oral Q6H  . allopurinol  300 mg Oral QPM  . bisacodyl  10 mg Oral Daily  . calcitRIOL  0.25 mcg Oral QODAY  . isosorbide mononitrate  15 mg Oral QHS  . multivitamin with minerals  1 tablet Oral QHS  . pantoprazole  40 mg Oral BID  . senna-docusate  1 tablet Oral QHS  . spironolactone  12.5 mg Oral Daily  . torsemide  80 mg Oral BID  . vitamin B-12  500  mcg Oral Daily    Dialysis Orders:  Assessment/Plan:  1.  Anemia:  Presumed GI source.    Has required transfusions.  Hemoglobin stable but remains in the eights..    Patient has declined colonoscopy.  GI following.  Will give 510 mg of Feraheme today.  Status post Aranesp 2/19  2.  CKD 4:  Creatinine within recent baseline currently.    Continue to monitor.   3.  H/o MR, TR, CHF: cardiology to consult while admitted.  She is near her baseline volume status.  Diuretic dosing per AHF.  Supplementing K as needed.   4.  Atrial fibrillation with AV node ablated and PP  5.  Pleural effusion: Status post pleural catheter 11/11/2018 dyspnea improved now on RA  Overall improved.  We will set up follow-up with Dr. Jimmy Footman in the office with labs prior.  No further suggestions.  Will sign off at the current time.  Rexene Agent

## 2018-11-14 NOTE — Progress Notes (Signed)
PROGRESS NOTE  [name] [name] XKP:[id number] DOB: 1946-08-04 DOA: 11/05/2018 PCP: [name], MD   LOS: 8 days   Brief Narrative / Interim history: 73 year old female with history of anemia, paroxysmal A. 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  She was admitted to the hospital, gastroenterology, nephrology as well as cardiology were consulted  Subjective: Feeling about the same, has some shortness of breath with activity but she is okay at rest.  Denies any chest pain, no abdominal pain, no nausea or vomiting  Assessment & Plan: Principal Problem:   Symptomatic anemia Active Problems:   Chronic combined systolic and diastolic CHF (congestive heart failure) (HCC)   Chest pain   AKI (acute kidney injury) ([place])   Hypokalemia   Severe anemia   Acute on chronic systolic CHF (congestive heart failure) (HCC)   Heme positive stool   Principal Problem Symptomatic anemia -Likely multifactorial in the setting of acute blood loss from slow GI bleeding as well as underlying chronic kidney disease stage IV -She received total of 4 units of packed red blood cells with improvement in her hemoglobin from 5.2 into the 8 range.  She received IV iron -She had recent extensive work-up including EGD as well as capsule endoscopy and they were unremarkable.  She was seen by gastroenterology and deemed not a good candidate for colonoscopy due to her respiratory status as well as heart failure -Discussed with GI, they also recommend outpatient follow-up with hematology to assist longitudinally -Hemoglobin has overall remained stable, clinically without bleeding  Active Problems Acute on  chronic combined systolic and diastolic CHF with persistent pleural effusion with multi valvular disease -Last LVEF was 30% in January 2020, echo also showed mild left ear, severe MR, moderate TR with PA peak pressure 42 mmHg -Unclear cause of cardiomyopathy?  Chronic RV pacing, advanced heart failure following and appreciate input. -She has this persistent right-sided pleural effusion for which thoracic surgery was consulted and placed a right Pleurx drain -Heart failure team signed off 2/19, continue Demadex.  Fluid status looks good today  Chronic kidney disease stage IV -Baseline creatinine around 2.5, this morning at baseline 2.5.  Nephrology following, discussed with Dr. Joelyn Oms, they will sign off  A. fib status post AV nodal ablation and PPM -Chronic pacing  OSA -Continue nightly CPAP  Liver cirrhosis -Platelets remained stable   Scheduled Meds: . sodium chloride   Intravenous Once  . acetaminophen  1,000 mg Oral Q6H   Or  . acetaminophen (TYLENOL) oral liquid 160 mg/5 mL  1,000 mg Oral Q6H  . allopurinol  300 mg Oral QPM  . bisacodyl  10 mg Oral Daily  . calcitRIOL  0.25 mcg Oral QODAY  . isosorbide mononitrate  15 mg Oral QHS  . multivitamin with minerals  1 tablet Oral QHS  . pantoprazole  40 mg Oral BID  . senna-docusate  1 tablet Oral QHS  . spironolactone  12.5 mg Oral Daily  . torsemide  80 mg Oral BID  . vitamin B-12  500 mcg Oral Daily   Continuous Infusions: . sodium chloride 10 mL/hr at 11/11/18 1643  . sodium chloride     PRN Meds:.docusate sodium, HYDROcodone-acetaminophen, methocarbamol, ondansetron (ZOFRAN) IV  DVT  prophylaxis: SCDs Code Status: DNR Family Communication: No family at bedside Disposition Plan: SNF when bed available  Consultants:   Gastroenterology  Nephrology  Thoracic surgery  Cardiology  Procedures:   None   Antimicrobials:  None  Objective: Vitals:   11/13/18 1827 11/13/18 2122 11/14/18 0515 11/14/18 1001    BP: (!) 112/55 128/71 (!) 102/55 (!) 96/53  Pulse: 92 (!) 101 91 89  Resp: 18 18 18 20   Temp:  98.4 F (36.9 C) 98.2 F (36.8 C) 98.3 F (36.8 C)  TempSrc:  Oral Oral Oral  SpO2: 100% 95% 98% 96%  Weight:  116.7 kg    Height:        Intake/Output Summary (Last 24 hours) at 11/14/2018 1139 Last data filed at 11/14/2018 0900 Gross per 24 hour  Intake 580 ml  Output 2400 ml  Net -1820 ml   Filed Weights   11/10/18 2122 11/12/18 2013 11/13/18 2122  Weight: 112 kg 116.8 kg 116.7 kg    Examination:  Constitutional: NAD Eyes: No scleral icterus ENMT: Moist mucous membranes Respiratory: Clear to auscultation bilaterally without wheezing, no crackles heard.  Trace lower extremity edema Cardiovascular: Regular rate and rhythm, 3/6 SEM, trace edema Abdomen: Soft, nontender, nondistended, bowel sounds positive Musculoskeletal: no clubbing / cyanosis.  Skin: No rashes seen, chronic venous stasis changes bilateral lower extremities Neurologic: No focal deficits Psychiatric: Normal judgment and insight. Alert and oriented x 3. Normal mood.    Data Reviewed: I have independently reviewed following labs and imaging studies   CBC: Recent Labs  Lab 11/09/18 0835 11/10/18 0404 11/11/18 0020 11/12/18 0447 11/13/18 0408 11/14/18 0530  WBC 6.7 6.3 7.4 7.3 7.5 5.6  NEUTROABS 5.4  --   --   --   --   --   HGB 7.7* 7.2* 8.2* 7.8* 8.4* 8.0*  HCT 25.7* 23.8* 27.4* 25.5* 27.2* 26.0*  MCV 93.8 94.4 94.5 95.9 96.5 95.2  PLT 158 147* 151 137* 144* 825*   Basic Metabolic Panel: Recent Labs  Lab 11/08/18 0829  11/10/18 0404 11/11/18 0020 11/12/18 0447 11/13/18 0408 11/14/18 0530  NA  --    < > 136 136 136 136 135  K  --    < > 3.3* 3.6 3.5 3.5 4.0  CL  --    < > 92* 97* 95* 94* 93*  CO2  --    < > 31 27 28 30 29   GLUCOSE  --    < > 120* 126* 106* 114* 94  BUN  --    < > 136* 123* 110* 102* 96*  CREATININE  --    < > 3.04* 2.95* 2.79* 2.66* 2.58*  CALCIUM  --    < > 8.5* 8.7*  8.6* 8.8* 8.6*  MG 3.0*  --   --   --   --   --   --    < > = values in this interval not displayed.   GFR: Estimated Creatinine Clearance: 25.2 mL/min (A) (by C-G formula based on SCr of 2.58 mg/dL (H)). Liver Function Tests: Recent Labs  Lab 11/10/18 0404  AST 19  ALT 14  ALKPHOS 41  BILITOT 1.1  PROT 5.6*  ALBUMIN 2.8*   No results for input(s): LIPASE, AMYLASE in the last 168 hours. No results for input(s): AMMONIA in the last 168 hours. Coagulation Profile: Recent Labs  Lab 11/10/18 0404  INR 1.19   Cardiac Enzymes: No results for input(s): CKTOTAL, CKMB, CKMBINDEX, TROPONINI in the  last 168 hours. BNP (last 3 results) No results for input(s): PROBNP in the last 8760 hours. HbA1C: No results for input(s): HGBA1C in the last 72 hours. CBG: No results for input(s): GLUCAP in the last 168 hours. Lipid Profile: No results for input(s): CHOL, HDL, LDLCALC, TRIG, CHOLHDL, LDLDIRECT in the last 72 hours. Thyroid Function Tests: No results for input(s): TSH, T4TOTAL, FREET4, T3FREE, THYROIDAB in the last 72 hours. Anemia Panel: Recent Labs    11/14/18 0929  FERRITIN 143  TIBC 344  IRON 26*   Urine analysis:    Component Value Date/Time   COLORURINE YELLOW 11/09/2018 [place] 11/09/2018 1653   LABSPEC 1.009 11/09/2018 1653   PHURINE 7.0 11/09/2018 1653   GLUCOSEU NEGATIVE 11/09/2018 1653   HGBUR NEGATIVE 11/09/2018 1653   BILIRUBINUR NEGATIVE 11/09/2018 [place] 11/09/2018 1653   PROTEINUR NEGATIVE 11/09/2018 1653   UROBILINOGEN 0.2 03/06/2013 2035   NITRITE NEGATIVE 11/09/2018 1653   LEUKOCYTESUR SMALL (A) 11/09/2018 1653   Sepsis Labs: Invalid input(s): PROCALCITONIN, LACTICIDVEN  Recent Results (from the past 240 hour(s))  Surgical pcr screen     Status: None   Collection Time: 11/09/18 12:44 PM  Result Value Ref Range Status   MRSA, PCR NEGATIVE NEGATIVE Final   Staphylococcus aureus NEGATIVE NEGATIVE Final     Comment: (NOTE) The Xpert SA Assay (FDA approved for NASAL specimens in patients 2 years of age and older), is one component of a comprehensive surveillance program. It is not intended to diagnose infection nor to guide or monitor treatment. Performed at [facility], [place] [address] [place], [place] [address]       Radiology Studies: No results found.   Marzetta Board, MD, PhD [facility]  Contact via  [email or web address]  [place] P: [phone]  F: [phone]

## 2018-11-15 LAB — CBC
HCT: 23.6 % — ABNORMAL LOW (ref 36.0–46.0)
Hemoglobin: 7.3 g/dL — ABNORMAL LOW (ref 12.0–15.0)
MCH: 29.7 pg (ref 26.0–34.0)
MCHC: 30.9 g/dL (ref 30.0–36.0)
MCV: 95.9 fL (ref 80.0–100.0)
Platelets: 108 10*3/uL — ABNORMAL LOW (ref 150–400)
RBC: 2.46 MIL/uL — ABNORMAL LOW (ref 3.87–5.11)
RDW: 18.6 % — ABNORMAL HIGH (ref 11.5–15.5)
WBC: 4.5 10*3/uL (ref 4.0–10.5)
nRBC: 0 % (ref 0.0–0.2)

## 2018-11-15 LAB — BASIC METABOLIC PANEL
ANION GAP: 8 (ref 5–15)
BUN: 91 mg/dL — ABNORMAL HIGH (ref 8–23)
CO2: 31 mmol/L (ref 22–32)
Calcium: 8.3 mg/dL — ABNORMAL LOW (ref 8.9–10.3)
Chloride: 95 mmol/L — ABNORMAL LOW (ref 98–111)
Creatinine, Ser: 2.76 mg/dL — ABNORMAL HIGH (ref 0.44–1.00)
GFR calc Af Amer: 19 mL/min — ABNORMAL LOW (ref >60)
GFR calc non Af Amer: 16 mL/min — ABNORMAL LOW (ref >60)
Glucose, Bld: 85 mg/dL (ref 70–99)
Potassium: 3.9 mmol/L (ref 3.5–5.1)
Sodium: 134 mmol/L — ABNORMAL LOW (ref 135–145)

## 2018-11-15 LAB — PREPARE RBC (CROSSMATCH)

## 2018-11-15 MED ORDER — HYDROCODONE-ACETAMINOPHEN 10-325 MG PO TABS: 1.0000 | ORAL_TABLET | ORAL | 0 refills | Status: DC

## 2018-11-15 MED ORDER — FAMOTIDINE 20 MG PO TABS
20.0000 mg | ORAL_TABLET | Freq: Every day | ORAL | Status: DC
  Administered 2018-11-15: 20 mg via ORAL
  Filled 2018-11-15: qty 1

## 2018-11-15 MED ORDER — SODIUM CHLORIDE 0.9% IV SOLUTION
Freq: Once | INTRAVENOUS | Status: AC
  Administered 2018-11-15: 16:00:00 via INTRAVENOUS

## 2018-11-15 NOTE — Progress Notes (Signed)
PROGRESS NOTE  [name] [name] NMM:[id number] DOB: [birth year] DOA: 11/05/2018 PCP: [name], MD   LOS: 9 days   Brief Narrative / Interim history: 73 year old female with history of anemia, paroxysmal A. 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  She was admitted to the hospital, gastroenterology, nephrology as well as cardiology were consulted  Subjective: Feeling well this morning, no chest pain, no shortness of breath, no abdominal pain, nausea or vomiting.  No lightheadedness or dizziness.  Assessment & Plan: Principal Problem:   Symptomatic anemia Active Problems:   Chronic combined systolic and diastolic CHF (congestive heart failure) (HCC)   Chest pain   AKI (acute kidney injury) (HCC)   Hypokalemia   Severe anemia   Acute on chronic systolic CHF (congestive heart failure) (HCC)   Heme positive stool   Principal Problem Symptomatic anemia -Likely multifactorial in the setting of acute blood loss from slow GI bleeding as well as underlying chronic kidney disease stage IV. She received total of 5 units of packed red blood cells with improvement in her hemoglobin from 5.2 into the 8 range.  She received IV iron however had a reaction to it and thus discontinued prior to finishing infusion.  Gastroenterology was consulted and followed patient while hospitalized. She had recent extensive work-up including EGD as well as capsule endoscopy and they were unremarkable.  She was seen by gastroenterology and deemed not a good candidate for colonoscopy due to her respiratory status as well as heart failure.  Hemoglobin overall stable, and clinically she has no significant  bleeding.  Active Problems Acute on chronic combined systolic and diastolic CHF with persistent pleural effusion with multi valvular disease -heart failure team was consulted and followed patient while hospitalized.  Last LVEF was 30% in January 2020, echo also showed mild left ear, severe MR, moderate TR with PA peak pressure 42 mmHg. Unclear cause of cardiomyopathy?  Chronic RV pacing, advanced heart failure following and appreciate input. She has this persistent right-sided pleural effusion for which thoracic surgery was consulted and placed a right Pleurx drain, continue drainage every other day as needed for shortness of breath Chronic kidney disease stage IV -Baseline creatinine around 2.5, overall stable and not far from baseline.  Continue home medications and follow-up outpatient with nephrology and cardiology A. fib status post AV nodal ablation and PPM -Chronic pacing OSA -Continue nightly CPAP Liver cirrhosis -Platelets overall stable   Scheduled Meds: . sodium chloride   Intravenous Once  . sodium chloride   Intravenous Once  . acetaminophen  1,000 mg Oral Q6H   Or  . acetaminophen (TYLENOL) oral liquid 160 mg/5 mL  1,000 mg Oral Q6H  . allopurinol  300 mg Oral QPM  . bisacodyl  10 mg Oral Daily  . calcitRIOL  0.25 mcg Oral QODAY  . isosorbide mononitrate  15 mg Oral QHS  . multivitamin with minerals  1 tablet Oral QHS  . pantoprazole  40 mg Oral BID  . senna-docusate  1 tablet Oral QHS  . spironolactone  12.5 mg Oral Daily  . torsemide  80 mg Oral BID  . vitamin B-12  500 mcg Oral Daily   Continuous Infusions: . sodium chloride 10 mL/hr at 11/11/18  1643  . sodium chloride    . famotidine (PEPCID) IV 20 mg (11/14/18 1704)   PRN Meds:.diphenhydrAMINE, docusate sodium, HYDROcodone-acetaminophen, methocarbamol, ondansetron (ZOFRAN) IV  DVT prophylaxis: SCDs Code Status: DNR Family Communication: No family at bedside Disposition Plan: SNF when cleared by  consultants  Consultants:   Gastroenterology  Nephrology  Thoracic surgery  Cardiology  Procedures:   None   Antimicrobials:  None  Objective: Vitals:   11/15/18 1038 11/15/18 1430 11/15/18 1438 11/15/18 1542  BP: (!) 90/49 (!) 103/43 (!) 92/56 (!) 102/45  Pulse: 94 89 98 98  Resp: 20   16  Temp: 98 F (36.7 C)   98.3 F (36.8 C)  TempSrc: Oral   Oral  SpO2: 96% 97%  98%  Weight:      Height:        Intake/Output Summary (Last 24 hours) at 11/15/2018 1544 Last data filed at 11/15/2018 1439 Gross per 24 hour  Intake 546.21 ml  Output 3000 ml  Net -2453.79 ml   Filed Weights   11/12/18 2013 11/13/18 2122 11/15/18 4854  Weight: 116.8 kg 116.7 kg 110.5 kg    Examination:  Constitutional: NAD Respiratory: CTA Cardiovascular: RRR  Data Reviewed: I have independently reviewed following labs and imaging studies   CBC: Recent Labs  Lab 11/09/18 0835  11/11/18 0020 11/12/18 0447 11/13/18 0408 11/14/18 0530 11/15/18 0618  WBC 6.7   < > 7.4 7.3 7.5 5.6 4.5  NEUTROABS 5.4  --   --   --   --   --   --   HGB 7.7*   < > 8.2* 7.8* 8.4* 8.0* 7.3*  HCT 25.7*   < > 27.4* 25.5* 27.2* 26.0* 23.6*  MCV 93.8   < > 94.5 95.9 96.5 95.2 95.9  PLT 158   < > 151 137* 144* 136* 108*   < > = values in this interval not displayed.   Basic Metabolic Panel: Recent Labs  Lab 11/11/18 0020 11/12/18 0447 11/13/18 0408 11/14/18 0530 11/15/18 0618  NA 136 136 136 135 134*  K 3.6 3.5 3.5 4.0 3.9  CL 97* 95* 94* 93* 95*  CO2 27 28 30 29 31   GLUCOSE 126* 106* 114* 94 85  BUN 123* 110* 102* 96* 91*  CREATININE 2.95* 2.79* 2.66* 2.58* 2.76*  CALCIUM 8.7* 8.6* 8.8* 8.6* 8.3*   GFR: Estimated Creatinine Clearance: 22.9 mL/min (A) (by C-G formula based on SCr of 2.76 mg/dL (H)). Liver Function Tests: Recent Labs  Lab 11/10/18 0404  AST 19  ALT 14  ALKPHOS 41  BILITOT 1.1  PROT 5.6*  ALBUMIN 2.8*   No results for input(s): LIPASE, AMYLASE in the last 168 hours. No  results for input(s): AMMONIA in the last 168 hours. Coagulation Profile: Recent Labs  Lab 11/10/18 0404  INR 1.19   Cardiac Enzymes: No results for input(s): CKTOTAL, CKMB, CKMBINDEX, TROPONINI in the last 168 hours. BNP (last 3 results) No results for input(s): PROBNP in the last 8760 hours. HbA1C: No results for input(s): HGBA1C in the last 72 hours. CBG: No results for input(s): GLUCAP in the last 168 hours. Lipid Profile: No results for input(s): CHOL, HDL, LDLCALC, TRIG, CHOLHDL, LDLDIRECT in the last 72 hours. Thyroid Function Tests: No results for input(s): TSH, T4TOTAL, FREET4, T3FREE, THYROIDAB in the last 72 hours. Anemia Panel: Recent Labs    11/14/18 0929  FERRITIN 143  TIBC 344  IRON 26*   Urine analysis:    Component Value Date/Time  COLORURINE YELLOW 11/09/2018 [place] 11/09/2018 1653   LABSPEC 1.009 11/09/2018 1653   PHURINE 7.0 11/09/2018 [place] 11/09/2018 [place] 11/09/2018 [place] 11/09/2018 [place] 11/09/2018 1653   PROTEINUR NEGATIVE 11/09/2018 1653   UROBILINOGEN 0.2 03/06/2013 2035   NITRITE NEGATIVE 11/09/2018 1653   LEUKOCYTESUR SMALL (A) 11/09/2018 1653   Sepsis Labs: Invalid input(s): PROCALCITONIN, LACTICIDVEN  Recent Results (from the past 240 hour(s))  Surgical pcr screen     Status: None   Collection Time: 11/09/18 12:44 PM  Result Value Ref Range Status   MRSA, PCR NEGATIVE NEGATIVE Final   Staphylococcus aureus NEGATIVE NEGATIVE Final    Comment: (NOTE) The Xpert SA Assay (FDA approved for NASAL specimens in patients 82 years of age and older), is one component of a comprehensive surveillance program. It is not intended to diagnose infection nor to guide or monitor treatment. Performed at [facility], [place] [address], [place], [place] [address]       Radiology Studies: No results found.   Marzetta Board, MD, PhD [facility]  Contact via  [email or web address]  [place] P: [phone]  F: [phone]

## 2018-11-15 NOTE — Clinical Social Work Note (Signed)
CSW made contact with the skilled facilities in [place] that patient's daughter had made contact with: (1) [place], admissions director. CSW advised that        they do not currently have any bed availability today or this weekend. She is also        not sure about Monday at this point. Requested clinicals be transmitted to facility.       *[address], [place] [address]       *Phone number [phone]; [phone] - fax number. (2)  [facility] - Talked with admissions director Esaw Dace. CSW         advised that they do not have any bed availability today, however requested that         clinicals be sent for review and they may have bed availability on Monday.        *[address] [place], [phone]- main number; [phone] - fax number   CSW will follow up with facility admissions directors on Monday regarding bed availability.  Raihana Balderrama Givens, MSW, LCSW Licensed Clinical Social Worker [place] [phone]

## 2018-11-15 NOTE — Progress Notes (Signed)
Patient requests for her IV pepcid to be changed to oral form. Patient won't take it unless it's pill form. MD on call text paged. [name] [name], Wonda Cheng, Therapist, sports

## 2018-11-15 NOTE — Progress Notes (Signed)
RT NOTE:  Pt has home CPAP @ bedside. Pt manages CPAP. RT available if assistance needed.

## 2018-11-16 LAB — COMPREHENSIVE METABOLIC PANEL
ALT: 14 U/L (ref 0–44)
AST: 22 U/L (ref 15–41)
Albumin: 2.5 g/dL — ABNORMAL LOW (ref 3.5–5.0)
Alkaline Phosphatase: 40 U/L (ref 38–126)
Anion gap: 14 (ref 5–15)
BUN: 86 mg/dL — ABNORMAL HIGH (ref 8–23)
CO2: 28 mmol/L (ref 22–32)
Calcium: 8.1 mg/dL — ABNORMAL LOW (ref 8.9–10.3)
Chloride: 94 mmol/L — ABNORMAL LOW (ref 98–111)
Creatinine, Ser: 3.06 mg/dL — ABNORMAL HIGH (ref 0.44–1.00)
GFR calc Af Amer: 17 mL/min — ABNORMAL LOW (ref >60)
GFR, EST NON AFRICAN AMERICAN: 14 mL/min — AB (ref >60)
Glucose, Bld: 108 mg/dL — ABNORMAL HIGH (ref 70–99)
Potassium: 3.4 mmol/L — ABNORMAL LOW (ref 3.5–5.1)
Sodium: 136 mmol/L (ref 135–145)
Total Bilirubin: 0.8 mg/dL (ref 0.3–1.2)
Total Protein: 5 g/dL — ABNORMAL LOW (ref 6.5–8.1)

## 2018-11-16 LAB — CBC
HCT: 25.4 % — ABNORMAL LOW (ref 36.0–46.0)
Hemoglobin: 7.9 g/dL — ABNORMAL LOW (ref 12.0–15.0)
MCH: 29.4 pg (ref 26.0–34.0)
MCHC: 31.1 g/dL (ref 30.0–36.0)
MCV: 94.4 fL (ref 80.0–100.0)
Platelets: 105 10*3/uL — ABNORMAL LOW (ref 150–400)
RBC: 2.69 MIL/uL — ABNORMAL LOW (ref 3.87–5.11)
RDW: 18.6 % — ABNORMAL HIGH (ref 11.5–15.5)
WBC: 4.8 10*3/uL (ref 4.0–10.5)
nRBC: 0 % (ref 0.0–0.2)

## 2018-11-16 MED ORDER — TORSEMIDE 20 MG PO TABS
40.0000 mg | ORAL_TABLET | Freq: Two times a day (BID) | ORAL | Status: DC
  Administered 2018-11-16 – 2018-11-17 (×3): 40 mg via ORAL
  Filled 2018-11-16 (×3): qty 2

## 2018-11-16 MED ORDER — POTASSIUM CHLORIDE CRYS ER 20 MEQ PO TBCR
40.0000 meq | EXTENDED_RELEASE_TABLET | Freq: Once | ORAL | Status: AC
  Administered 2018-11-16: 40 meq via ORAL
  Filled 2018-11-16: qty 2

## 2018-11-16 MED ORDER — DIPHENHYDRAMINE HCL 12.5 MG/5ML PO ELIX: 12.5000 mg | ORAL_SOLUTION | Freq: Three times a day (TID) | ORAL | Status: DC | PRN

## 2018-11-16 NOTE — Progress Notes (Signed)
PROGRESS NOTE  [name] [name] BBC:[id number] DOB: 1946-01-28 DOA: 11/05/2018 PCP: [name], MD   LOS: 10 days   Brief Narrative / Interim history: 73 year old female with history of anemia, paroxysmal A. 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  She was admitted to the hospital, gastroenterology, nephrology as well as cardiology were consulted  Subjective: No complaints, no chest pain, no shortness of breath.  Overall feeling well.  Assessment & Plan: Principal Problem:   Symptomatic anemia Active Problems:   Chronic combined systolic and diastolic CHF (congestive heart failure) (HCC)   Chest pain   AKI (acute kidney injury) (HCC)   Hypokalemia   Severe anemia   Acute on chronic systolic CHF (congestive heart failure) (HCC)   Heme positive stool   Principal Problem Symptomatic anemia -Likely multifactorial in the setting of acute blood loss from slow GI bleeding as well as underlying chronic kidney disease stage IV. She received total of 5 units of packed red blood cells with improvement in her hemoglobin from 5.2 into the 8 range.  She received IV iron however had a reaction to it and thus discontinued prior to finishing infusion.  Gastroenterology was consulted and followed patient while hospitalized. She had recent extensive work-up including EGD as well as capsule endoscopy and they were unremarkable.  She was seen by gastroenterology and deemed not a good candidate for colonoscopy due to her respiratory status as well as heart failure.  Hemoglobin overall stable, and clinically she has no significant bleeding. -Hemoglobin stable at 7.9 this morning  Active  Problems Acute on chronic combined systolic and diastolic CHF with persistent pleural effusion with multi valvular disease -heart failure team was consulted and followed patient while hospitalized.  Last LVEF was 30% in January 2020, echo also showed mild left ear, severe MR, moderate TR with PA peak pressure 42 mmHg. Unclear cause of cardiomyopathy?  Chronic RV pacing, advanced heart failure following and appreciate input. She has this persistent right-sided pleural effusion for which thoracic surgery was consulted and placed a right Pleurx drain, continue drainage every other day as needed for shortness of breath -Decreased dose of torsemide today for slightly worsening creatinine Acute kidney injury on chronic kidney disease stage IV -Baseline creatinine around 2.5, overall stable and not far from baseline.  Continue home medications and follow-up outpatient with nephrology and cardiology -Creatinine increased progressively over the last 2 days now up to 3, given hypotension suspect a degree of intravascular depletion, decrease torsemide dose A. fib status post AV nodal ablation and PPM -Chronic pacing OSA -Continue nightly CPAP Liver cirrhosis -Platelets overall stable   Scheduled Meds: . sodium chloride   Intravenous Once  . acetaminophen  1,000 mg Oral Q6H   Or  . acetaminophen (TYLENOL) oral liquid 160 mg/5 mL  1,000 mg Oral Q6H  . allopurinol  300 mg Oral QPM  . bisacodyl  10 mg Oral Daily  . calcitRIOL  0.25 mcg Oral QODAY  . isosorbide mononitrate  15 mg Oral QHS  . multivitamin with minerals  1 tablet Oral QHS  . pantoprazole  40 mg Oral BID  . senna-docusate  1 tablet Oral QHS  . torsemide  40 mg Oral BID  . vitamin  B-12  500 mcg Oral Daily   Continuous Infusions: . sodium chloride 10 mL/hr at 11/11/18 1643  . sodium chloride     PRN Meds:.diphenhydrAMINE, docusate sodium, HYDROcodone-acetaminophen, methocarbamol, ondansetron (ZOFRAN) IV  DVT prophylaxis: SCDs Code Status:  DNR Family Communication: No family at bedside Disposition Plan: SNF when cleared by consultants  Consultants:   Gastroenterology  Nephrology  Thoracic surgery  Cardiology  Procedures:   None   Antimicrobials:  None  Objective: Vitals:   11/15/18 2003 11/16/18 0400 11/16/18 0500 11/16/18 0901  BP: (!) 97/38 98/61  92/64  Pulse: 88 91  (!) 106  Resp: (!) 22 20  18   Temp: 98.2 F (36.8 C) 98.1 F (36.7 C)  (!) 97.3 F (36.3 C)  TempSrc: Oral Oral  Oral  SpO2: 99% 98%  98%  Weight: 109.2 kg  110.4 kg   Height:        Intake/Output Summary (Last 24 hours) at 11/16/2018 1135 Last data filed at 11/16/2018 1030 Gross per 24 hour  Intake 480 ml  Output 2600 ml  Net -2120 ml   Filed Weights   11/15/18 0638 11/15/18 2003 11/16/18 0500  Weight: 110.5 kg 109.2 kg 110.4 kg    Examination:  Constitutional: NAD, calm, comfortable Eyes: PERRL, lids and conjunctivae normal ENMT: Mucous membranes are moist. Respiratory: clear to auscultation bilaterally, no wheezing, no crackles. Normal respiratory effort. Cardiovascular: Regular rate and rhythm, 3/6 SEM no edema Abdomen: no tenderness, no masses palpated. Bowel sounds positive.  Musculoskeletal: no clubbing / cyanosis. Normal muscle tone.  Skin: Chronic venous stasis changes bilateral lower extremities Neurologic: CN 2-12 grossly intact. Strength 5/5 in all 4.   Data Reviewed: I have independently reviewed following labs and imaging studies   CBC: Recent Labs  Lab 11/12/18 0447 11/13/18 0408 11/14/18 0530 11/15/18 0618 11/16/18 0441  WBC 7.3 7.5 5.6 4.5 4.8  HGB 7.8* 8.4* 8.0* 7.3* 7.9*  HCT 25.5* 27.2* 26.0* 23.6* 25.4*  MCV 95.9 96.5 95.2 95.9 94.4  PLT 137* 144* 136* 108* 505*   Basic Metabolic Panel: Recent Labs  Lab 11/12/18 0447 11/13/18 0408 11/14/18 0530 11/15/18 0618 11/16/18 0441  NA 136 136 135 134* 136  K 3.5 3.5 4.0 3.9 3.4*  CL 95* 94* 93* 95* 94*  CO2 28 30 29 31 28   GLUCOSE 106*  114* 94 85 108*  BUN 110* 102* 96* 91* 86*  CREATININE 2.79* 2.66* 2.58* 2.76* 3.06*  CALCIUM 8.6* 8.8* 8.6* 8.3* 8.1*   GFR: Estimated Creatinine Clearance: 20.6 mL/min (A) (by C-G formula based on SCr of 3.06 mg/dL (H)). Liver Function Tests: Recent Labs  Lab 11/10/18 0404 11/16/18 0441  AST 19 22  ALT 14 14  ALKPHOS 41 40  BILITOT 1.1 0.8  PROT 5.6* 5.0*  ALBUMIN 2.8* 2.5*   No results for input(s): LIPASE, AMYLASE in the last 168 hours. No results for input(s): AMMONIA in the last 168 hours. Coagulation Profile: Recent Labs  Lab 11/10/18 0404  INR 1.19   Cardiac Enzymes: No results for input(s): CKTOTAL, CKMB, CKMBINDEX, TROPONINI in the last 168 hours. BNP (last 3 results) No results for input(s): PROBNP in the last 8760 hours. HbA1C: No results for input(s): HGBA1C in the last 72 hours. CBG: No results for input(s): GLUCAP in the last 168 hours. Lipid Profile: No results for input(s): CHOL, HDL, LDLCALC, TRIG, CHOLHDL, LDLDIRECT in the last 72 hours. Thyroid Function Tests: No results for input(s): TSH, T4TOTAL, FREET4, T3FREE, THYROIDAB in the last 72  hours. Anemia Panel: Recent Labs    11/14/18 0929  FERRITIN 143  TIBC 344  IRON 26*   Urine analysis:    Component Value Date/Time   COLORURINE YELLOW 11/09/2018 [place] 11/09/2018 1653   LABSPEC 1.009 11/09/2018 1653   PHURINE 7.0 11/09/2018 1653   GLUCOSEU NEGATIVE 11/09/2018 1653   HGBUR NEGATIVE 11/09/2018 1653   BILIRUBINUR NEGATIVE 11/09/2018 [place] 11/09/2018 1653   PROTEINUR NEGATIVE 11/09/2018 1653   UROBILINOGEN 0.2 03/06/2013 2035   NITRITE NEGATIVE 11/09/2018 1653   LEUKOCYTESUR SMALL (A) 11/09/2018 1653   Sepsis Labs: Invalid input(s): PROCALCITONIN, LACTICIDVEN  Recent Results (from the past 240 hour(s))  Surgical pcr screen     Status: None   Collection Time: 11/09/18 12:44 PM  Result Value Ref Range Status   MRSA, PCR NEGATIVE NEGATIVE Final    Staphylococcus aureus NEGATIVE NEGATIVE Final    Comment: (NOTE) The Xpert SA Assay (FDA approved for NASAL specimens in patients 41 years of age and older), is one component of a comprehensive surveillance program. It is not intended to diagnose infection nor to guide or monitor treatment. Performed at [facility], [place] [address], [place], [place] [address]       Radiology Studies: No results found.   Marzetta Board, MD, PhD [facility]  Contact via  [email or web address]  [place] P: [phone]  F: [phone]

## 2018-11-16 NOTE — Progress Notes (Signed)
RT NOTE:  Pt has home CPAP @ bedside. Pt manages CPAP. RT available if assistance needed

## 2018-11-17 LAB — CBC
HEMATOCRIT: 26.8 % — AB (ref 36.0–46.0)
Hemoglobin: 8.4 g/dL — ABNORMAL LOW (ref 12.0–15.0)
MCH: 29.6 pg (ref 26.0–34.0)
MCHC: 31.3 g/dL (ref 30.0–36.0)
MCV: 94.4 fL (ref 80.0–100.0)
PLATELETS: 109 10*3/uL — AB (ref 150–400)
RBC: 2.84 MIL/uL — ABNORMAL LOW (ref 3.87–5.11)
RDW: 18.1 % — ABNORMAL HIGH (ref 11.5–15.5)
WBC: 5.2 10*3/uL (ref 4.0–10.5)
nRBC: 0 % (ref 0.0–0.2)

## 2018-11-17 LAB — BASIC METABOLIC PANEL
Anion gap: 9 (ref 5–15)
BUN: 81 mg/dL — ABNORMAL HIGH (ref 8–23)
CO2: 31 mmol/L (ref 22–32)
Calcium: 8.4 mg/dL — ABNORMAL LOW (ref 8.9–10.3)
Chloride: 94 mmol/L — ABNORMAL LOW (ref 98–111)
Creatinine, Ser: 2.84 mg/dL — ABNORMAL HIGH (ref 0.44–1.00)
GFR calc Af Amer: 18 mL/min — ABNORMAL LOW (ref >60)
GFR calc non Af Amer: 16 mL/min — ABNORMAL LOW (ref >60)
Glucose, Bld: 90 mg/dL (ref 70–99)
Potassium: 3.7 mmol/L (ref 3.5–5.1)
Sodium: 134 mmol/L — ABNORMAL LOW (ref 135–145)

## 2018-11-17 MED ORDER — HYDROCODONE-ACETAMINOPHEN 10-325 MG PO TABS
1.0000 | ORAL_TABLET | Freq: Four times a day (QID) | ORAL | Status: DC | PRN
  Administered 2018-11-17 – 2018-11-18 (×4): 1 via ORAL
  Filled 2018-11-17 (×4): qty 1

## 2018-11-17 MED ORDER — TORSEMIDE 20 MG PO TABS
80.0000 mg | ORAL_TABLET | Freq: Two times a day (BID) | ORAL | Status: DC
  Administered 2018-11-17 – 2018-11-18 (×2): 80 mg via ORAL
  Filled 2018-11-17 (×2): qty 4

## 2018-11-17 MED ORDER — POTASSIUM CHLORIDE CRYS ER 20 MEQ PO TBCR
30.0000 meq | EXTENDED_RELEASE_TABLET | Freq: Once | ORAL | Status: AC
  Administered 2018-11-17: 30 meq via ORAL
  Filled 2018-11-17: qty 1

## 2018-11-17 NOTE — Progress Notes (Signed)
Patient has home CPAP on at this time. Patient stated she did not need any assistance.

## 2018-11-17 NOTE — Progress Notes (Signed)
PROGRESS NOTE  [name] [name] KPT:[id number] DOB: 1946/03/24 DOA: 11/05/2018 PCP: [name], MD   LOS: 11 days   Brief Narrative / Interim history: 73 year old female with history of anemia, paroxysmal A. 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  She was admitted to the hospital, gastroenterology, nephrology as well as cardiology were consulted  Subjective: No complaints, feeling well.  No chest pain, no shortness of breath, no abdominal pain, nausea or vomiting  Assessment & Plan: Principal Problem:   Symptomatic anemia Active Problems:   Chronic combined systolic and diastolic CHF (congestive heart failure) (HCC)   Chest pain   AKI (acute kidney injury) (HCC)   Hypokalemia   Severe anemia   Acute on chronic systolic CHF (congestive heart failure) (HCC)   Heme positive stool   Principal Problem Symptomatic anemia -Likely multifactorial in the setting of acute blood loss from slow GI bleeding as well as underlying chronic kidney disease stage IV. She received total of 5 units of packed red blood cells with improvement in her hemoglobin from 5.2 into the 8 range.  She received IV iron however had a reaction to it and thus discontinued prior to finishing infusion.  Gastroenterology was consulted and followed patient while hospitalized. She had recent extensive work-up including EGD as well as capsule endoscopy and they were unremarkable.  She was seen by gastroenterology and deemed not a good candidate for colonoscopy due to her respiratory status as well as heart failure.  Hemoglobin overall stable, and clinically she has no significant bleeding. -Hemoglobin remained  stable at 8.4 today  Active Problems Acute on chronic combined systolic and diastolic CHF with persistent pleural effusion with multi valvular disease -heart failure team was consulted and followed patient while hospitalized.  Last LVEF was 30% in January 2020, echo also showed mild left ear, severe MR, moderate TR with PA peak pressure 42 mmHg. Unclear cause of cardiomyopathy?  Chronic RV pacing, advanced heart failure following and appreciate input. She has this persistent right-sided pleural effusion for which thoracic surgery was consulted and placed a right Pleurx drain, continue drainage every other day as needed for shortness of breath -2/22 I decrease the torsemide dose slightly increased creatinine now back to her home dose of 80 twice daily Acute kidney injury on chronic kidney disease stage IV -Baseline creatinine around 2.5, overall stable and not far from baseline.  Continue home medications and follow-up outpatient with nephrology and cardiology -Creatinine stabilized today at 2.8 A. fib status post AV nodal ablation and PPM -Chronic pacing OSA -Continue nightly CPAP Liver cirrhosis -Platelets overall stable   Scheduled Meds: . sodium chloride   Intravenous Once  . allopurinol  300 mg Oral QPM  . bisacodyl  10 mg Oral Daily  . calcitRIOL  0.25 mcg Oral QODAY  . isosorbide mononitrate  15 mg Oral QHS  . multivitamin with minerals  1 tablet Oral QHS  . pantoprazole  40 mg Oral BID  . senna-docusate  1 tablet Oral QHS  . torsemide  80 mg Oral BID  . vitamin B-12  500 mcg Oral Daily   Continuous Infusions: . sodium chloride 10 mL/hr at 11/11/18 1643  . sodium chloride     PRN Meds:.diphenhydrAMINE,  docusate sodium, HYDROcodone-acetaminophen, methocarbamol, ondansetron (ZOFRAN) IV  DVT prophylaxis: SCDs Code Status: DNR Family Communication: No family at bedside Disposition Plan: SNF when bed available  Consultants:   Gastroenterology  Nephrology  Thoracic  surgery  Cardiology  Procedures:   None   Antimicrobials:  None  Objective: Vitals:   11/16/18 1752 11/16/18 2128 11/17/18 0516 11/17/18 0910  BP: (!) 94/48 (!) 101/51 100/60 110/62  Pulse: (!) 102 98 91 (!) 101  Resp: 19 16 20 18   Temp: 98.3 F (36.8 C) 97.6 F (36.4 C) 97.7 F (36.5 C) 98.1 F (36.7 C)  TempSrc: Oral Oral Oral Oral  SpO2: 97% 100% 99% 100%  Weight:  109 kg    Height:        Intake/Output Summary (Last 24 hours) at 11/17/2018 1057 Last data filed at 11/17/2018 0600 Gross per 24 hour  Intake 240 ml  Output 1750 ml  Net -1510 ml   Filed Weights   11/15/18 2003 11/16/18 0500 11/16/18 2128  Weight: 109.2 kg 110.4 kg 109 kg    Examination:  Constitutional: NAD Respiratory: CTA Cardiovascular: RRR, 3/6 SEM  Data Reviewed: I have independently reviewed following labs and imaging studies   CBC: Recent Labs  Lab 11/13/18 0408 11/14/18 0530 11/15/18 0618 11/16/18 0441 11/17/18 0343  WBC 7.5 5.6 4.5 4.8 5.2  HGB 8.4* 8.0* 7.3* 7.9* 8.4*  HCT 27.2* 26.0* 23.6* 25.4* 26.8*  MCV 96.5 95.2 95.9 94.4 94.4  PLT 144* 136* 108* 105* 937*   Basic Metabolic Panel: Recent Labs  Lab 11/13/18 0408 11/14/18 0530 11/15/18 0618 11/16/18 0441 11/17/18 0343  NA 136 135 134* 136 134*  K 3.5 4.0 3.9 3.4* 3.7  CL 94* 93* 95* 94* 94*  CO2 30 29 31 28 31   GLUCOSE 114* 94 85 108* 90  BUN 102* 96* 91* 86* 81*  CREATININE 2.66* 2.58* 2.76* 3.06* 2.84*  CALCIUM 8.8* 8.6* 8.3* 8.1* 8.4*   GFR: Estimated Creatinine Clearance: 22.1 mL/min (A) (by C-G formula based on SCr of 2.84 mg/dL (H)). Liver Function Tests: Recent Labs  Lab 11/16/18 0441  AST 22  ALT 14  ALKPHOS 40  BILITOT 0.8  PROT 5.0*  ALBUMIN 2.5*   No results for input(s): LIPASE, AMYLASE in the last 168 hours. No results for input(s): AMMONIA in the last 168 hours. Coagulation Profile: No results for input(s): INR, PROTIME in the last 168 hours. Cardiac Enzymes: No results for  input(s): CKTOTAL, CKMB, CKMBINDEX, TROPONINI in the last 168 hours. BNP (last 3 results) No results for input(s): PROBNP in the last 8760 hours. HbA1C: No results for input(s): HGBA1C in the last 72 hours. CBG: No results for input(s): GLUCAP in the last 168 hours. Lipid Profile: No results for input(s): CHOL, HDL, LDLCALC, TRIG, CHOLHDL, LDLDIRECT in the last 72 hours. Thyroid Function Tests: No results for input(s): TSH, T4TOTAL, FREET4, T3FREE, THYROIDAB in the last 72 hours. Anemia Panel: No results for input(s): VITAMINB12, FOLATE, FERRITIN, TIBC, IRON, RETICCTPCT in the last 72 hours. Urine analysis:    Component Value Date/Time   COLORURINE YELLOW 11/09/2018 [place] 11/09/2018 1653   LABSPEC 1.009 11/09/2018 1653   PHURINE 7.0 11/09/2018 [place] 11/09/2018 [place] 11/09/2018 [place] 11/09/2018 [place] 11/09/2018 1653   PROTEINUR NEGATIVE 11/09/2018 1653   UROBILINOGEN 0.2 03/06/2013 2035   NITRITE NEGATIVE 11/09/2018 1653   LEUKOCYTESUR SMALL (A) 11/09/2018 1653  Sepsis Labs: Invalid input(s): PROCALCITONIN, LACTICIDVEN  Recent Results (from the past 240 hour(s))  Surgical pcr screen     Status: None   Collection Time: 11/09/18 12:44 PM  Result Value Ref Range Status   MRSA, PCR NEGATIVE NEGATIVE Final   Staphylococcus aureus NEGATIVE NEGATIVE Final    Comment: (NOTE) The Xpert SA Assay (FDA approved for NASAL specimens in patients 32 years of age and older), is one component of a comprehensive surveillance program. It is not intended to diagnose infection nor to guide or monitor treatment. Performed at [facility], [place] [address], [place], [place] [address]       Radiology Studies: No results found.   Marzetta Board, MD, PhD [facility]  Contact via  [email or web address]  [place] P: [phone]  F: [phone]

## 2018-11-17 NOTE — Progress Notes (Signed)
950 cc of ambered colored fluid drained from Pleur-X catheter on a sterile set-up.Procedure well tolerated by the patient.

## 2018-11-18 ENCOUNTER — Inpatient Hospital Stay (HOSPITAL_COMMUNITY): Payer: Medicare Other

## 2018-11-18 DIAGNOSIS — R195 Other fecal abnormalities: Secondary | ICD-10-CM | POA: Diagnosis not present

## 2018-11-18 DIAGNOSIS — Z66 Do not resuscitate: Secondary | ICD-10-CM | POA: Diagnosis present

## 2018-11-18 DIAGNOSIS — E876 Hypokalemia: Secondary | ICD-10-CM | POA: Diagnosis not present

## 2018-11-18 DIAGNOSIS — I5023 Acute on chronic systolic (congestive) heart failure: Secondary | ICD-10-CM | POA: Diagnosis not present

## 2018-11-18 DIAGNOSIS — I5042 Chronic combined systolic (congestive) and diastolic (congestive) heart failure: Secondary | ICD-10-CM | POA: Diagnosis present

## 2018-11-18 DIAGNOSIS — N170 Acute kidney failure with tubular necrosis: Secondary | ICD-10-CM | POA: Diagnosis not present

## 2018-11-18 DIAGNOSIS — Z87442 Personal history of urinary calculi: Secondary | ICD-10-CM | POA: Diagnosis not present

## 2018-11-18 DIAGNOSIS — K929 Disease of digestive system, unspecified: Secondary | ICD-10-CM | POA: Diagnosis not present

## 2018-11-18 DIAGNOSIS — I425 Other restrictive cardiomyopathy: Secondary | ICD-10-CM | POA: Diagnosis not present

## 2018-11-18 DIAGNOSIS — N189 Chronic kidney disease, unspecified: Secondary | ICD-10-CM | POA: Diagnosis not present

## 2018-11-18 DIAGNOSIS — J90 Pleural effusion, not elsewhere classified: Secondary | ICD-10-CM | POA: Diagnosis not present

## 2018-11-18 DIAGNOSIS — E611 Iron deficiency: Secondary | ICD-10-CM | POA: Diagnosis not present

## 2018-11-18 DIAGNOSIS — N179 Acute kidney failure, unspecified: Secondary | ICD-10-CM | POA: Diagnosis not present

## 2018-11-18 DIAGNOSIS — Z8541 Personal history of malignant neoplasm of cervix uteri: Secondary | ICD-10-CM | POA: Diagnosis not present

## 2018-11-18 DIAGNOSIS — I495 Sick sinus syndrome: Secondary | ICD-10-CM | POA: Diagnosis not present

## 2018-11-18 DIAGNOSIS — Z79899 Other long term (current) drug therapy: Secondary | ICD-10-CM | POA: Diagnosis not present

## 2018-11-18 DIAGNOSIS — I4821 Permanent atrial fibrillation: Secondary | ICD-10-CM | POA: Diagnosis present

## 2018-11-18 DIAGNOSIS — I129 Hypertensive chronic kidney disease with stage 1 through stage 4 chronic kidney disease, or unspecified chronic kidney disease: Secondary | ICD-10-CM | POA: Diagnosis not present

## 2018-11-18 DIAGNOSIS — D62 Acute posthemorrhagic anemia: Secondary | ICD-10-CM | POA: Diagnosis present

## 2018-11-18 DIAGNOSIS — M1990 Unspecified osteoarthritis, unspecified site: Secondary | ICD-10-CM | POA: Diagnosis present

## 2018-11-18 DIAGNOSIS — X58XXXA Exposure to other specified factors, initial encounter: Secondary | ICD-10-CM | POA: Diagnosis present

## 2018-11-18 DIAGNOSIS — Z950 Presence of cardiac pacemaker: Secondary | ICD-10-CM | POA: Diagnosis not present

## 2018-11-18 DIAGNOSIS — K219 Gastro-esophageal reflux disease without esophagitis: Secondary | ICD-10-CM | POA: Diagnosis present

## 2018-11-18 DIAGNOSIS — D509 Iron deficiency anemia, unspecified: Secondary | ICD-10-CM | POA: Diagnosis not present

## 2018-11-18 DIAGNOSIS — D649 Anemia, unspecified: Secondary | ICD-10-CM | POA: Diagnosis not present

## 2018-11-18 DIAGNOSIS — D631 Anemia in chronic kidney disease: Secondary | ICD-10-CM | POA: Diagnosis not present

## 2018-11-18 DIAGNOSIS — Z7982 Long term (current) use of aspirin: Secondary | ICD-10-CM | POA: Diagnosis not present

## 2018-11-18 DIAGNOSIS — Z993 Dependence on wheelchair: Secondary | ICD-10-CM | POA: Diagnosis not present

## 2018-11-18 DIAGNOSIS — Z6841 Body Mass Index (BMI) 40.0 and over, adult: Secondary | ICD-10-CM | POA: Diagnosis not present

## 2018-11-18 DIAGNOSIS — N2581 Secondary hyperparathyroidism of renal origin: Secondary | ICD-10-CM | POA: Diagnosis not present

## 2018-11-18 DIAGNOSIS — I2510 Atherosclerotic heart disease of native coronary artery without angina pectoris: Secondary | ICD-10-CM | POA: Diagnosis present

## 2018-11-18 DIAGNOSIS — D500 Iron deficiency anemia secondary to blood loss (chronic): Secondary | ICD-10-CM | POA: Diagnosis not present

## 2018-11-18 DIAGNOSIS — I482 Chronic atrial fibrillation, unspecified: Secondary | ICD-10-CM | POA: Diagnosis not present

## 2018-11-18 DIAGNOSIS — Z9689 Presence of other specified functional implants: Secondary | ICD-10-CM

## 2018-11-18 DIAGNOSIS — I340 Nonrheumatic mitral (valve) insufficiency: Secondary | ICD-10-CM | POA: Diagnosis present

## 2018-11-18 DIAGNOSIS — Z87891 Personal history of nicotine dependence: Secondary | ICD-10-CM | POA: Diagnosis not present

## 2018-11-18 DIAGNOSIS — N184 Chronic kidney disease, stage 4 (severe): Secondary | ICD-10-CM | POA: Diagnosis present

## 2018-11-18 DIAGNOSIS — K7581 Nonalcoholic steatohepatitis (NASH): Secondary | ICD-10-CM | POA: Diagnosis not present

## 2018-11-18 DIAGNOSIS — M109 Gout, unspecified: Secondary | ICD-10-CM | POA: Diagnosis present

## 2018-11-18 DIAGNOSIS — I272 Pulmonary hypertension, unspecified: Secondary | ICD-10-CM | POA: Diagnosis not present

## 2018-11-18 DIAGNOSIS — I5022 Chronic systolic (congestive) heart failure: Secondary | ICD-10-CM | POA: Diagnosis not present

## 2018-11-18 DIAGNOSIS — R5381 Other malaise: Secondary | ICD-10-CM | POA: Diagnosis not present

## 2018-11-18 DIAGNOSIS — I428 Other cardiomyopathies: Secondary | ICD-10-CM | POA: Diagnosis present

## 2018-11-18 DIAGNOSIS — K7460 Unspecified cirrhosis of liver: Secondary | ICD-10-CM | POA: Diagnosis present

## 2018-11-18 DIAGNOSIS — I130 Hypertensive heart and chronic kidney disease with heart failure and stage 1 through stage 4 chronic kidney disease, or unspecified chronic kidney disease: Secondary | ICD-10-CM | POA: Diagnosis present

## 2018-11-18 DIAGNOSIS — K922 Gastrointestinal hemorrhage, unspecified: Secondary | ICD-10-CM | POA: Diagnosis present

## 2018-11-18 DIAGNOSIS — I10 Essential (primary) hypertension: Secondary | ICD-10-CM | POA: Diagnosis not present

## 2018-11-18 DIAGNOSIS — G4733 Obstructive sleep apnea (adult) (pediatric): Secondary | ICD-10-CM | POA: Diagnosis present

## 2018-11-18 DIAGNOSIS — Z90710 Acquired absence of both cervix and uterus: Secondary | ICD-10-CM | POA: Diagnosis not present

## 2018-11-18 DIAGNOSIS — I110 Hypertensive heart disease with heart failure: Secondary | ICD-10-CM | POA: Diagnosis not present

## 2018-11-18 DIAGNOSIS — Z8249 Family history of ischemic heart disease and other diseases of the circulatory system: Secondary | ICD-10-CM | POA: Diagnosis not present

## 2018-11-18 LAB — CBC
HCT: 28.7 % — ABNORMAL LOW (ref 36.0–46.0)
Hemoglobin: 8.8 g/dL — ABNORMAL LOW (ref 12.0–15.0)
MCH: 29.1 pg (ref 26.0–34.0)
MCHC: 30.7 g/dL (ref 30.0–36.0)
MCV: 95 fL (ref 80.0–100.0)
NRBC: 0 % (ref 0.0–0.2)
Platelets: 135 10*3/uL — ABNORMAL LOW (ref 150–400)
RBC: 3.02 MIL/uL — ABNORMAL LOW (ref 3.87–5.11)
RDW: 18.2 % — ABNORMAL HIGH (ref 11.5–15.5)
WBC: 5.5 10*3/uL (ref 4.0–10.5)

## 2018-11-18 LAB — BASIC METABOLIC PANEL
Anion gap: 9 (ref 5–15)
BUN: 71 mg/dL — ABNORMAL HIGH (ref 8–23)
CHLORIDE: 98 mmol/L (ref 98–111)
CO2: 30 mmol/L (ref 22–32)
Calcium: 8.5 mg/dL — ABNORMAL LOW (ref 8.9–10.3)
Creatinine, Ser: 2.62 mg/dL — ABNORMAL HIGH (ref 0.44–1.00)
GFR calc non Af Amer: 17 mL/min — ABNORMAL LOW (ref >60)
GFR, EST AFRICAN AMERICAN: 20 mL/min — AB (ref >60)
Glucose, Bld: 87 mg/dL (ref 70–99)
Potassium: 3.8 mmol/L (ref 3.5–5.1)
Sodium: 137 mmol/L (ref 135–145)

## 2018-11-18 MED ORDER — POLYETHYLENE GLYCOL 3350 17 G PO PACK
17.0000 g | PACK | Freq: Every day | ORAL | Status: DC
  Administered 2018-11-18: 17 g via ORAL
  Filled 2018-11-18: qty 1

## 2018-11-18 MED ORDER — SENNOSIDES-DOCUSATE SODIUM 8.6-50 MG PO TABS
2.0000 | ORAL_TABLET | Freq: Two times a day (BID) | ORAL | Status: DC
  Administered 2018-11-18: 2 via ORAL
  Filled 2018-11-18: qty 2

## 2018-11-18 MED ORDER — SENNOSIDES 8.8 MG/5ML PO SYRP: 10.0000 mL | ORAL_SOLUTION | Freq: Two times a day (BID) | ORAL | Status: DC

## 2018-11-18 NOTE — Clinical Social Work Placement (Signed)
   CLINICAL SOCIAL WORK PLACEMENT  NOTE **DISCHARGED TO [facility], TRANSPORTED BY FAMILY  Date:  11/18/2018  Patient Details  Name: [name] [name] MRN: [id number] Date of Birth: [birth year]  Clinical Social Work is seeking post-discharge placement for this patient at the [place] level of care (*CSW will initial, date and re-position this form in  chart as items are completed):  No(Family provided CSW with facility preferences)   Patient/family provided with [facility] Work Department's list of facilities offering this level of care within the geographic area requested by the patient (or if unable, by the patient's family).  Yes   Patient/family informed of their freedom to choose among providers that offer the needed level of care, that participate in Medicare, Medicaid or managed care program needed by the patient, have an available bed and are willing to accept the patient.  No   Patient/family informed of [facility]'s ownership interest in [facility] and [facility], as well as of the fact that they are under no obligation to receive care at these facilities.  PASRR submitted to EDS on (Patient discharging to SNF in [place] not needed)     PASRR number received on       Existing PASRR number confirmed on       FL2 transmitted to all facilities in geographic area requested by pt/family on 11/15/18([facility] in [place], [place] and [facility] in [place], [place])     [place] transmitted to all facilities within larger geographic area on       Patient informed that his/her managed care company has contracts with or will negotiate with certain facilities, including the following:        Yes   Patient/family informed of bed offers received.  Patient chooses bed at  [facility], [place], [place]   Physician recommends and patient chooses bed at      Patient to be transferred to   on 11/18/18.  Patient to be transferred  to facility by Daughter Giavana Rooke     Patient family notified on 11/18/18 of transfer.  Name of family member notified:  Daughter Pheonix Wis[place] - [phone]     PHYSICIAN       Additional Comment:    _______________________________________________ Sable Feil, LCSW 11/18/2018, 2:54 PM

## 2018-11-18 NOTE — Care Management Note (Addendum)
Case Management Note Manya Silvas, RN MSN CCM Transitions of Care 2[facility] [place] [phone]  Patient Details  Name: [name] [name] MRN: [id number] Date of Birth: 10-Oct-1945  Subjective/Objective;    Symptomatic anemia                   Action/Plan: PTA home. Pt to transition to SNF today. PleurX catheter orders faxed to [facility] to mail hard copy to [facility]. Next catheter drain tomorrow 11/19/2018. 1 case of drainage bottles ordered from materials management for patient transition to SNF-bottles at bedside. Family to provide transport to SNF. No other transition of care needs identified.   Expected Discharge Date:  11/18/18               Expected Discharge Plan:  Skilled Nursing Facility  In-House Referral:  Clinical Social Work  Discharge planning Services  CM Consult  Post Acute Care Choice:  NA Choice offered to:  NA  DME Arranged:  N/A DME Agency:  NA  HH Arranged:  NA HH Agency:  NA  Status of Service:  Completed, signed off  If discussed at [employer] of Stay Meetings, dates discussed:    Additional Comments: 11/18/2018-Notified by CSW that facility changed. Updated fax and hard copy information for [facility]. Telephone call to [facility] who advised that updated fax was all that was needed stating that they get to fax orders the next day.   Bartholomew Crews, RN 11/18/2018, 12:10 PM

## 2018-11-18 NOTE — Discharge Summary (Addendum)
Physician Discharge Summary  [name] [name] TDD:[id number] DOB: [birth year] DOA: 11/05/2018  PCP: [name], MD  Admit date: 11/05/2018 Discharge date: 11/18/2018  Admitted From: home Disposition:  SNF  Recommendations for Outpatient Follow-up:  1. Follow up with PCP in 1-2 weeks 2. Follow-up with cardiology in 1 to 2 weeks 3. Continue Pleurx catheter drainage every other day 4. Recommend daily weights  Home Health: None Equipment/Devices: None  Discharge Condition: Stable CODE STATUS: DNR Diet recommendation: Low-sodium diet  HPI: Per admitting MD, [name] [name] is a 73 y.o. 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  Patient reports having fatigue and dyspnea for the past 2 days.  Denies having any hematemesis, hematochezia, or melena.  Denies having any abdominal pain.  States her gastroenterologist is Dr. Chartered loss adjuster in [place].  Reports having history of angina but states she has been having intermittent substernal chest pressure at rest lasting 30 to 40 seconds for the past few days.  Daughter at bedside states patient takes torsemide and metolazone for her lower extremity edema.  Hospital Course: Principal Problem Symptomatic anemia -Likely multifactorial in the setting of acute blood loss from slow GI bleeding as well as underlying chronic kidney disease stage IV. She received total of 5 units of packed red blood cells with improvement in her hemoglobin from 5.2 into the 8 range. She received IV iron however had a reaction to it and thus discontinued prior to finishing infusion.  Gastroenterology was consulted and followed patient while hospitalized. She had  recent extensive work-up including EGD as well as capsule endoscopy and they were unremarkable.  She was seen by gastroenterology and deemed not a good candidate for colonoscopy due to her respiratory status as well as heart failure.  Hemoglobin overall stable, and clinically she has no significant bleeding.  Will need outpatient follow-up with gastroenterology  Active Problems Acute on chronic combined systolic and diastolic CHF with persistent pleural effusion with multi valvular disease -heart failure team was consulted and followed patient while hospitalized.  Last LVEF was 30% in January 2020, echo also showed mild AS, severe MR, moderate TR with PA peak pressure 42 mmHg. Unclear cause of cardiomyopathy?  Chronic RV pacing, advanced heart failure following and appreciate input, will need close outpatient follow-up as well. She has this persistent right-sided pleural effusion for which thoracic surgery was consulted and placed a right Pleurx drain, continue drainage every other day as needed for shortness of breath.  She remained stable on current diuretic regimen, continue as below Acute kidney injury on chronic kidney disease stage IV -Baseline creatinine around 2.5, overall stable and not far from baseline.  Continue home medications and follow-up outpatient with nephrology and cardiology.  Creatinine 2.6 on discharge.  Continue to monitor BMPs A. fib status post AV nodal ablation and PPM -Chronic pacing, she has refused anticoagulation in the past now not a candidate due to concern for occult GI bleed OSA -Continue nightly CPAP Liver cirrhosis -Platelets overall stable  Discharge Diagnoses:  Principal Problem:   Symptomatic anemia Active Problems:   Chronic combined systolic and diastolic CHF (congestive heart failure) (HCC)   Chest pain   AKI (acute kidney injury) (HCC)   Hypokalemia   Severe anemia   Acute on chronic systolic  CHF (congestive heart failure) (HCC)   Heme positive  stool     Discharge Instructions   Allergies as of 11/18/2018      Reactions   Beta Adrenergic Blockers Anaphylaxis, Other (See Comments)   "DEATH" (Bradycardia and her organs shut down)   Cephalexin Shortness Of Breath   Codeine Anaphylaxis, Swelling   Throat swelling   Contrast Media [iodinated Diagnostic Agents] Other (See Comments)   Patient states "I have chronic kidney disease, so the doctor said no dye in my veins."   Coreg [carvedilol] Other (See Comments)   Beta Blockers cause her organs to shut down (per patient)   Peppermint Flavor Shortness Of Breath   Prednisone Anaphylaxis, Shortness Of Breath, Swelling   Throat swells    Tape Other (See Comments)   States plastic tape blisters her skin   Ciprofloxacin Hives   Tolerating levofloxin    Latex Itching, Rash   Penicillins Hives   DID THE REACTION INVOLVE: Swelling of the face/tongue/throat, SOB, or low BP? Yes Sudden or severe rash/hives, skin peeling, or the inside of the mouth or nose? Yes Did it require medical treatment? Pt was in hospital at the time of reaction When did it last happen?Was in her mid-40's If all above answers are "NO", may proceed with cephalosporin use.   Diamox [acetazolamide] Rash   Rash after 2 doses of diamox       Medication List    TAKE these medications   allopurinol 300 MG tablet Commonly known as:  ZYLOPRIM Take 300 mg by mouth every evening.   aspirin EC 325 MG tablet Take 325 mg by mouth at bedtime.   calcitRIOL 0.25 MCG capsule Commonly known as:  ROCALTROL Take 0.25 mcg by mouth every other day.   clotrimazole-betamethasone cream Commonly known as:  LOTRISONE Apply 1 application topically 2 (two) times daily as needed (for irritation- affected sites).   COD LIVER OIL PO Take 1 capsule by mouth daily.   docusate sodium 100 MG capsule Commonly known as:  COLACE Take 100 mg by mouth daily as needed for mild constipation.   feeding supplement Liqd Take 1  Container by mouth 2 (two) times daily.   ferrous sulfate 325 (65 FE) MG tablet Take 325 mg by mouth at bedtime.   HYDROcodone-acetaminophen 10-325 MG tablet Commonly known as:  NORCO Take 1 tablet by mouth See admin instructions. Take 1 tablet by mouth two times a day and an additional 0.5 tablet two times a day as needed for pain   isosorbide mononitrate 30 MG 24 hr tablet Commonly known as:  IMDUR Take 0.5 tablets (15 mg total) by mouth daily. What changed:  when to take this   methocarbamol 500 MG tablet Commonly known as:  ROBAXIN Take 1 tablet (500 mg total) by mouth every 6 (six) hours as needed for muscle spasms.   metolazone 2.5 MG tablet Commonly known as:  ZAROXOLYN Take 2.5 mg by mouth every Monday, Wednesday, and Friday.   multivitamin with minerals Tabs tablet Take 1 tablet by mouth at bedtime.   pantoprazole 40 MG tablet Commonly known as:  PROTONIX Take 1 tablet (40 mg total) by mouth 2 (two) times daily.   polyethylene glycol packet Commonly known as:  MIRALAX / GLYCOLAX Take 17 g by mouth at bedtime.   potassium chloride SA 20 MEQ tablet Commonly known as:  K-DUR,KLOR-CON Start on 10/24/18 What changed:    how much to take  how to take this  when to  take this  additional instructions  Another medication with the same name was removed. Continue taking this medication, and follow the directions you see here. Take 40 mEq by mouth in the morning and 20 mEq in the evening   torsemide 20 MG tablet Commonly known as:  DEMADEX Take 4 tablets (80 mg total) by mouth 2 (two) times daily. May also take 1 tablet (20 mg total) as needed (for weight greater than 246). What changed:  See the new instructions.   vitamin B-12 500 MCG tablet Commonly known as:  CYANOCOBALAMIN Take 500 mcg by mouth daily.      Follow-up Information    Deterding, Jeneen Rinks, MD Follow up.   Specialty:  Nephrology Why:  We will call you with instructions Contact information: [place] [address] [phone]           Consultations:  Gastroenterology  Cardiology-heart failure  Nephrology  Procedures/Studies:  None   Dg Chest 2 View  Result Date: 11/05/2018 CLINICAL DATA:  Weakness.  Dyspnea on exertion. EXAM: CHEST - 2 VIEW COMPARISON:  10/09/2018 FINDINGS: Left chest wall single lead pacemaker unchanged. Moderate cardiomegaly with medium-sized right pleural effusion, greater than on the previous exam. Associated atelectasis of the right lung base. The left lung is clear. IMPRESSION: Moderate cardiomegaly with intermediate sized right pleural effusion and associated atelectasis. Electronically Signed   By: Ulyses Jarred M.D.   On: 11/05/2018 22:39   Ct Chest Wo Contrast  Result Date: 11/08/2018 CLINICAL DATA:  Chest pain and shortness of breath. Pleural effusion. Right thoracentesis 10/09/2018. EXAM: CT CHEST WITHOUT CONTRAST TECHNIQUE: Multidetector CT imaging of the chest was performed following the standard protocol without IV contrast. COMPARISON:  Radiographs today and earlier. Abdominal CT 04/05/2016. No previous chest CT. FINDINGS: Cardiovascular: There is atherosclerosis of the aorta, great vessels and coronary arteries. Left subclavian pacemaker lead extends to the right ventricular apex. The heart is enlarged. There are aortic valvular calcifications. No significant pericardial effusion. Mediastinum/Nodes: There are no enlarged mediastinal, hilar or axillary lymph nodes.Hilar assessment is limited by the lack of intravenous contrast. 10 mm low-density right thyroid lesion on image 11/3 is unlikely to be clinically significant. The trachea and esophagus appear unremarkable. Lungs/Pleura: There is a recurrent moderate size dependent right pleural effusion which has increased since the thoracentesis done 1 month ago. There is trace pleural fluid on the left. The right lower lobe is completely collapsed and consolidated. No discrete  endobronchial lesion identified. There is patchy dependent atelectasis elsewhere in the lungs. There are no suspicious nodules within the aerated portions of the lungs. Upper abdomen: Upper abdominal ascites has improved compared with the previous abdominal CT. The IVC and hepatic veins are distended. There is contour irregularity of the liver, suspicious for underlying cirrhosis. Musculoskeletal/Chest wall: There is no chest wall mass or suspicious osseous finding. Moderate glenohumeral arthropathy bilaterally. IMPRESSION: 1. Recurrence of a moderate-sized pleural effusion following thoracentesis 1 month ago. This pleural effusion is suboptimally characterized without contrast. Correlation with prior thoracentesis results recommended. 2. Complete collapse/consolidation of the right lower lobe without visible endobronchial lesion. Patchy atelectasis elsewhere in the lungs. 3. Cardiomegaly, probable aortic valvular calcifications and Aortic Atherosclerosis (ICD10-I70.0). 4. Probable hepatic cirrhosis as suggested previously. Electronically Signed   By: Richardean Sale M.D.   On: 11/08/2018 19:16   Dg Chest Port 1 View  Result Date: 11/18/2018 CLINICAL DATA:  Dyspnea. EXAM: PORTABLE CHEST 1 VIEW COMPARISON:  Radiograph of November 11, 2018. FINDINGS: Stable cardiomegaly.  Atherosclerosis of thoracic aorta is noted. Single lead left-sided pacemaker is unchanged in position. No pneumothorax is noted. Left lung is clear. Right-sided chest tube is unchanged in position. Small right pleural effusion is noted. Bony thorax is unremarkable. IMPRESSION: Stable position of right-sided chest tube without pneumothorax. Small right pleural effusion is noted. Aortic Atherosclerosis (ICD10-I70.0). Electronically Signed   By: Marijo Conception, M.D.   On: 11/18/2018 10:08   Dg Chest Port 1 View  Result Date: 11/11/2018 CLINICAL DATA:  Right-sided PleurX catheter drain placed per EXAM: PORTABLE CHEST 1 VIEW COMPARISON:   11/08/2018 and chest CT 11/08/2018 FINDINGS: Left-sided pacemaker unchanged. Right-sided PleurX drainage catheter in place. Evidence of small right pleural effusion with significant interval improvement. Left lung is clear. Stable cardiomegaly. Remainder of the exam is unchanged. IMPRESSION: Interval improvement in right pleural effusion with small residual effusion. Right PleurX drainage catheter in place. Stable cardiomegaly. Electronically Signed   By: Marin Olp M.D.   On: 11/11/2018 11:17   Dg Chest Port 1 View  Result Date: 11/08/2018 CLINICAL DATA:  Lower chest pain, shortness of breath EXAM: PORTABLE CHEST 1 VIEW COMPARISON:  11/05/2018 FINDINGS: Slight interval decrease in a small to moderate right pleural effusion with associated atelectasis or consolidation. The left lung base is normally aerated. There is mild, diffuse interstitial pulmonary opacity, likely pulmonary edema similar to prior examination, and gross cardiomegaly with left chest single lead pacer. No new airspace opacity. IMPRESSION: Slight interval decrease in a small to moderate right pleural effusion with associated atelectasis or consolidation. The left lung base is normally aerated. There is mild, diffuse interstitial pulmonary opacity, likely pulmonary edema similar to prior examination, and gross cardiomegaly with left chest single lead pacer. No new airspace opacity. Electronically Signed   By: Eddie Candle M.D.   On: 11/08/2018 09:26   Dg Abd 2 Views  Result Date: 11/05/2018 CLINICAL DATA:  Dyspnea on exertion. Anemia. EXAM: ABDOMEN - 2 VIEW COMPARISON:  None. FINDINGS: No dilated small bowel or free intraperitoneal air. There is multilevel lumbar spine degenerative change with mild levoscoliosis. No acute osseous abnormality. IMPRESSION: No radiographic evidence of small bowel obstruction. Electronically Signed   By: Ulyses Jarred M.D.   On: 11/05/2018 22:40   Dg C-arm 1-60 Min-no Report  Result Date:  11/11/2018 Fluoroscopy was utilized by the requesting physician.  No radiographic interpretation.     Subjective: - no chest pain, shortness of breath, no abdominal pain, nausea or vomiting.   Discharge Exam: BP (!) 105/58 (BP Location: Right Arm)   Pulse 99   Temp 98 F (36.7 C) (Oral)   Resp 20   Ht 5' 6"  (1.676 m)   Wt 107.8 kg   SpO2 97%   BMI 38.35 kg/m   General: Pt is alert, awake, not in acute distress Cardiovascular: RRR, S1/S2 +, no rubs, no gallops Respiratory: CTA bilaterally, no wheezing, no rhonchi Abdominal: Soft, NT, ND, bowel sounds + Extremities: no edema, no cyanosis    The results of significant diagnostics from this hospitalization (including imaging, microbiology, ancillary and laboratory) are listed below for reference.     Microbiology: Recent Results (from the past 240 hour(s))  Surgical pcr screen     Status: None   Collection Time: 11/09/18 12:44 PM  Result Value Ref Range Status   MRSA, PCR NEGATIVE NEGATIVE Final   Staphylococcus aureus NEGATIVE NEGATIVE Final    Comment: (NOTE) The Xpert SA Assay (FDA approved for NASAL specimens in patients 22 years  of age and older), is one component of a comprehensive surveillance program. It is not intended to diagnose infection nor to guide or monitor treatment. Performed at [facility], [address], [place], [place] [address]      Labs: BNP (last 3 results) Recent Labs    06/20/18 2249 10/06/18 1309 11/05/18 2224  BNP 160.6* 214.8* 683.4*   Basic Metabolic Panel: Recent Labs  Lab 11/14/18 0530 11/15/18 0618 11/16/18 0441 11/17/18 0343 11/18/18 0410  NA 135 134* 136 134* 137  K 4.0 3.9 3.4* 3.7 3.8  CL 93* 95* 94* 94* 98  CO2 29 31 28 31 30   GLUCOSE 94 85 108* 90 87  BUN 96* 91* 86* 81* 71*  CREATININE 2.58* 2.76* 3.06* 2.84* 2.62*  CALCIUM 8.6* 8.3* 8.1* 8.4* 8.5*   Liver Function Tests: Recent Labs  Lab 11/16/18 0441  AST 22  ALT 14  ALKPHOS 40  BILITOT 0.8   PROT 5.0*  ALBUMIN 2.5*   No results for input(s): LIPASE, AMYLASE in the last 168 hours. No results for input(s): AMMONIA in the last 168 hours. CBC: Recent Labs  Lab 11/14/18 0530 11/15/18 0618 11/16/18 0441 11/17/18 0343 11/18/18 0410  WBC 5.6 4.5 4.8 5.2 5.5  HGB 8.0* 7.3* 7.9* 8.4* 8.8*  HCT 26.0* 23.6* 25.4* 26.8* 28.7*  MCV 95.2 95.9 94.4 94.4 95.0  PLT 136* 108* 105* 109* 135*   Cardiac Enzymes: No results for input(s): CKTOTAL, CKMB, CKMBINDEX, TROPONINI in the last 168 hours. BNP: Invalid input(s): POCBNP CBG: No results for input(s): GLUCAP in the last 168 hours. D-Dimer No results for input(s): DDIMER in the last 72 hours. Hgb A1c No results for input(s): HGBA1C in the last 72 hours. Lipid Profile No results for input(s): CHOL, HDL, LDLCALC, TRIG, CHOLHDL, LDLDIRECT in the last 72 hours. Thyroid function studies No results for input(s): TSH, T4TOTAL, T3FREE, THYROIDAB in the last 72 hours.  Invalid input(s): FREET3 Anemia work up No results for input(s): VITAMINB12, FOLATE, FERRITIN, TIBC, IRON, RETICCTPCT in the last 72 hours. Urinalysis    Component Value Date/Time   COLORURINE YELLOW 11/09/2018 [place] 11/09/2018 1653   LABSPEC 1.009 11/09/2018 1653   PHURINE 7.0 11/09/2018 1653   GLUCOSEU NEGATIVE 11/09/2018 1653   HGBUR NEGATIVE 11/09/2018 1653   BILIRUBINUR NEGATIVE 11/09/2018 1653   KETONESUR NEGATIVE 11/09/2018 1653   PROTEINUR NEGATIVE 11/09/2018 1653   UROBILINOGEN 0.2 03/06/2013 2035   NITRITE NEGATIVE 11/09/2018 1653   LEUKOCYTESUR SMALL (A) 11/09/2018 1653   Sepsis Labs Invalid input(s): PROCALCITONIN,  WBC,  LACTICIDVEN  FURTHER DISCHARGE INSTRUCTIONS:   Get Medicines reviewed and adjusted: Please take all your medications with you for your next visit with your Primary MD   Laboratory/radiological data: Please request your Primary MD to go over all hospital tests and procedure/radiological results at the  follow up, please ask your Primary MD to get all Hospital records sent to his/her office.   In some cases, they will be blood work, cultures and biopsy results pending at the time of your discharge. Please request that your primary care M.D. goes through all the records of your hospital data and follows up on these results.   Also Note the following: If you experience worsening of your admission symptoms, develop shortness of breath, life threatening emergency, suicidal or homicidal thoughts you must seek medical attention immediately by calling 911 or calling your MD immediately  if symptoms less severe.   You must read complete instructions/literature  along with all the possible adverse reactions/side effects for all the Medicines you take and that have been prescribed to you. Take any new Medicines after you have completely understood and accpet all the possible adverse reactions/side effects.    Do not drive when taking Pain medications or sleeping medications (Benzodaizepines)   Do not take more than prescribed Pain, Sleep and Anxiety Medications. It is not advisable to combine anxiety,sleep and pain medications without talking with your primary care practitioner   Special Instructions: If you have smoked or chewed Tobacco  in the last 2 yrs please stop smoking, stop any regular Alcohol  and or any Recreational drug use.   Wear Seat belts while driving.   Please note: You were cared for by a hospitalist during your hospital stay. Once you are discharged, your primary care physician will handle any further medical issues. Please note that NO REFILLS for any discharge medications will be authorized once you are discharged, as it is imperative that you return to your primary care physician (or establish a relationship with a primary care physician if you do not have one) for your post hospital discharge needs so that they can reassess your need for medications and monitor your lab values.  Time  coordinating discharge: 40 minutes  SIGNED:  Marzetta Board, MD  [facility] 11/18/2018, 11:33 AM

## 2018-11-19 DIAGNOSIS — J90 Pleural effusion, not elsewhere classified: Secondary | ICD-10-CM | POA: Diagnosis not present

## 2018-11-19 DIAGNOSIS — I5022 Chronic systolic (congestive) heart failure: Secondary | ICD-10-CM | POA: Diagnosis not present

## 2018-11-19 DIAGNOSIS — R5381 Other malaise: Secondary | ICD-10-CM | POA: Diagnosis not present

## 2018-11-19 DIAGNOSIS — G4733 Obstructive sleep apnea (adult) (pediatric): Secondary | ICD-10-CM | POA: Diagnosis not present

## 2018-11-19 LAB — TYPE AND SCREEN
ABO/RH(D): O POS
Antibody Screen: POSITIVE
Unit division: 0
Unit division: 0

## 2018-11-19 LAB — BPAM RBC
Blood Product Expiration Date: 202003202359
Blood Product Expiration Date: 202003202359
ISSUE DATE / TIME: 202002211544
Unit Type and Rh: 5100
Unit Type and Rh: 5100

## 2018-11-20 ENCOUNTER — Telehealth (HOSPITAL_COMMUNITY): Payer: Self-pay

## 2018-11-20 NOTE — Telephone Encounter (Signed)
Pts daughter called for post hospital f/u appt s they were not provided prior to discharge.  APP appt made for 11/28/18 12p. Daughter appreciative

## 2018-11-25 DIAGNOSIS — N2581 Secondary hyperparathyroidism of renal origin: Secondary | ICD-10-CM | POA: Diagnosis not present

## 2018-11-25 DIAGNOSIS — D631 Anemia in chronic kidney disease: Secondary | ICD-10-CM | POA: Diagnosis not present

## 2018-11-25 DIAGNOSIS — I5042 Chronic combined systolic (congestive) and diastolic (congestive) heart failure: Secondary | ICD-10-CM | POA: Diagnosis not present

## 2018-11-25 DIAGNOSIS — I129 Hypertensive chronic kidney disease with stage 1 through stage 4 chronic kidney disease, or unspecified chronic kidney disease: Secondary | ICD-10-CM | POA: Diagnosis not present

## 2018-11-25 DIAGNOSIS — N184 Chronic kidney disease, stage 4 (severe): Secondary | ICD-10-CM | POA: Diagnosis not present

## 2018-11-27 ENCOUNTER — Encounter: Payer: Medicare Other | Admitting: Physician Assistant

## 2018-11-28 ENCOUNTER — Encounter: Payer: Self-pay | Admitting: Internal Medicine

## 2018-11-28 ENCOUNTER — Ambulatory Visit (HOSPITAL_COMMUNITY)
Admission: RE | Admit: 2018-11-28 | Discharge: 2018-11-28 | Disposition: A | Discharge status: Home / Self Care | Payer: No Typology Code available for payment source | Source: Ambulatory Visit | Attending: Internal Medicine | Admitting: Internal Medicine

## 2018-11-28 ENCOUNTER — Encounter (HOSPITAL_COMMUNITY): Payer: Self-pay

## 2018-11-28 ENCOUNTER — Ambulatory Visit (INDEPENDENT_AMBULATORY_CARE_PROVIDER_SITE_OTHER): Payer: Medicare Other | Admitting: Internal Medicine

## 2018-11-28 VITALS — BP 98/60 | HR 92 | Ht 66.0 in | Wt 248.6 lb

## 2018-11-28 VITALS — BP 102/64 | HR 98 | Wt 248.6 lb

## 2018-11-28 DIAGNOSIS — I5022 Chronic systolic (congestive) heart failure: Secondary | ICD-10-CM

## 2018-11-28 DIAGNOSIS — I4821 Permanent atrial fibrillation: Secondary | ICD-10-CM | POA: Insufficient documentation

## 2018-11-28 DIAGNOSIS — Z950 Presence of cardiac pacemaker: Secondary | ICD-10-CM | POA: Insufficient documentation

## 2018-11-28 DIAGNOSIS — E876 Hypokalemia: Secondary | ICD-10-CM | POA: Insufficient documentation

## 2018-11-28 DIAGNOSIS — G4733 Obstructive sleep apnea (adult) (pediatric): Secondary | ICD-10-CM | POA: Insufficient documentation

## 2018-11-28 DIAGNOSIS — J90 Pleural effusion, not elsewhere classified: Secondary | ICD-10-CM | POA: Diagnosis not present

## 2018-11-28 DIAGNOSIS — M1990 Unspecified osteoarthritis, unspecified site: Secondary | ICD-10-CM | POA: Insufficient documentation

## 2018-11-28 DIAGNOSIS — Z87891 Personal history of nicotine dependence: Secondary | ICD-10-CM | POA: Insufficient documentation

## 2018-11-28 DIAGNOSIS — K219 Gastro-esophageal reflux disease without esophagitis: Secondary | ICD-10-CM | POA: Insufficient documentation

## 2018-11-28 DIAGNOSIS — N184 Chronic kidney disease, stage 4 (severe): Secondary | ICD-10-CM | POA: Diagnosis not present

## 2018-11-28 DIAGNOSIS — I340 Nonrheumatic mitral (valve) insufficiency: Secondary | ICD-10-CM

## 2018-11-28 DIAGNOSIS — I5042 Chronic combined systolic (congestive) and diastolic (congestive) heart failure: Secondary | ICD-10-CM | POA: Diagnosis not present

## 2018-11-28 DIAGNOSIS — I425 Other restrictive cardiomyopathy: Secondary | ICD-10-CM | POA: Insufficient documentation

## 2018-11-28 DIAGNOSIS — Z66 Do not resuscitate: Secondary | ICD-10-CM | POA: Insufficient documentation

## 2018-11-28 DIAGNOSIS — I272 Pulmonary hypertension, unspecified: Secondary | ICD-10-CM | POA: Insufficient documentation

## 2018-11-28 DIAGNOSIS — I130 Hypertensive heart and chronic kidney disease with heart failure and stage 1 through stage 4 chronic kidney disease, or unspecified chronic kidney disease: Secondary | ICD-10-CM | POA: Diagnosis not present

## 2018-11-28 DIAGNOSIS — Z8249 Family history of ischemic heart disease and other diseases of the circulatory system: Secondary | ICD-10-CM | POA: Insufficient documentation

## 2018-11-28 DIAGNOSIS — Z7982 Long term (current) use of aspirin: Secondary | ICD-10-CM | POA: Insufficient documentation

## 2018-11-28 DIAGNOSIS — M109 Gout, unspecified: Secondary | ICD-10-CM | POA: Insufficient documentation

## 2018-11-28 DIAGNOSIS — D509 Iron deficiency anemia, unspecified: Secondary | ICD-10-CM | POA: Insufficient documentation

## 2018-11-28 DIAGNOSIS — Z79899 Other long term (current) drug therapy: Secondary | ICD-10-CM | POA: Insufficient documentation

## 2018-11-28 NOTE — Patient Instructions (Addendum)
Medication Instructions:  Your physician recommends that you continue on your current medications as directed. Please refer to the Current Medication list given to you today.  Labwork: None ordered.  Testing/Procedures: None ordered.  Follow-Up:  Your physician wants you to follow-up in: 6 months with Device clinic for a device check.   Your physician wants you to follow-up in: one year with Dr. Lovena Le.  You will receive a reminder letter in the mail two months in advance. If you don't receive a letter, please call our office to schedule the follow-up appointment.  Any Other Special Instructions Will Be Listed Below (If Applicable).  If you need a refill on your cardiac medications before your next appointment, please call your pharmacy.

## 2018-11-28 NOTE — Progress Notes (Signed)
PCP: Dr Lauretta Grill  Primary Cardiologist: Dr Harrington Challenger  Structural Heart: Dr Burt Knack EP: Dr Lovena Le Nephrology: Dr Jimmy Footman  HPI: [name] [name] is a 73 y.o. female with a history of morbid obesity, afib s/p AV ablation andSt JudePPM, chronic combined HF, CKD IV, severe MR, TR, pulmonary HTN, OSA on BiPAP, GERD, gout, and iron deficiency anemia. Cardiac cath 2009 with normal coronary arteries   Admitted 06/20/18 with CP and SOB.Echo was completed and showed EF 40-45% with severe MR/TR. Cause of cardiomyopathy uncertain.  Could be valvular, could be due to chronic RV pacing . Underwent RHC and TEE (see below). Diuresed with IV lasix and transistioned to torsemide 80 mg twice a day. Discharge weight 244.6 pounds.   She saw Dr Burt Knack on 10/16 for possible mitral clip. There was concern mitral clip may not make a difference due to multiple co-morbidities including RV failure with severe TR.   Admitted 2/11 - 2/24 with symptomatic anemia and chest pain. Course complicated by pleural effusion requiring R Pleurx drain.   She presents today for post hospital follow up. Her breathing has improved, but weight is up.  She states the diet at [facility] is "very salty". Drinking 48-60 oz daily. She doesn't notice a significant amount of UOP increase on metolazone days, which is now M/W/F per Dr. Jimmy Footman. HD has been mentioned to her, but no solid plans. She denies CP. Will be in rehab for at least 1 more week. No further overt bleeding that she knows of. R arm pain/infiltrate has resolved. Taking all medication as directed via facility.    Labs 11/26/2018 via facility. 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   Echo  (06/21/2018): EF 40-45% Moderate TR, Severe MR   Review of systems complete and found to be negative unless listed in HPI.    SH:  Social History   Socioeconomic History  . Marital status: Widowed    Spouse name: Not on file  . Number of children: 1  . Years of education: Not on file  . Highest education level: Some college, no degree  Occupational History  . Not on file  Social Needs  . Financial resource strain: Not hard at all  . Food insecurity:    Worry: Never true    Inability: Never true  . Transportation needs:    Medical: No    Non-medical: No  Tobacco Use  . Smoking status: Former Smoker    Packs/day: 1.00    Types: Cigarettes    Last attempt to quit: 09/26/1979    Years since quitting: 39.2  . Smokeless tobacco: Never Used  Substance and Sexual Activity  . Alcohol use: No  . Drug use: No  . Sexual activity: Never  Lifestyle  . Physical activity:    Days per week: Not on file    Minutes per session: Not on file  . Stress: Not on file  Relationships  . Social connections:    Talks on phone: Not on file    Gets together: Not on file    Attends religious service: Not on file    Active member of club or organization: Not on file  Attends meetings of clubs or organizations: Not on file    Relationship status: Not on file  . Intimate partner violence:    Fear of current or ex partner: Not on file    Emotionally abused: Not on file    Physically abused: Not on file    Forced sexual activity: Not on file  Other Topics Concern  . Not on file  Social History Narrative  . Not on file    FH:  Family History  Problem Relation Age of Onset  . Heart attack Father   . Stroke Mother   . Diabetes Sister   . Colon cancer Other        seven family members  . Liver disease Neg Hx   . Other Neg Hx     Past Medical History:  Diagnosis Date  . Anemia   . Atrial fibrillation ([place])   . Cataract   . Cervical cancer ([place]) 1991   s/p hysterectomy  . Chronic  cellulitis   . Chronic combined systolic and diastolic congestive heart failure, NYHA class 2 (HCC)    LVEF 25-30% with restrictive diastolic filling  . Degenerative joint disease   . Diverticulitis   . Essential hypertension   . GERD (gastroesophageal reflux disease)   . Gout   . Kidney stones   . Morbid obesity ([place])   . NICM (nonischemic cardiomyopathy) ([place]) 02/22/2017  . Osteoarthritis   . Permanent atrial fibrillation 05/04/2009   Qualifier: History of  By: Quentin Cornwall CMA, Janett Billow    . 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 Sinoatrial node dysfunction [facility])    Status post PPM - Dr. Lovena Le  . Sleep apnea    CPAP    Current Outpatient Medications  Medication Sig Dispense Refill  . allopurinol (ZYLOPRIM) 300 MG tablet Take 300 mg by mouth every evening.     Marland Kitchen aspirin EC 325 MG tablet Take 325 mg by mouth at bedtime.    . calcitRIOL (ROCALTROL) 0.25 MCG capsule Take 0.25 mcg by mouth every other day.     . clotrimazole-betamethasone (LOTRISONE) cream Apply 1 application topically 2 (two) times daily as needed (for irritation- affected sites).     . COD LIVER OIL PO Take 1 capsule by mouth daily.     Marland Kitchen docusate sodium (COLACE) 100 MG capsule Take 100 mg by mouth daily as needed for mild constipation.     . feeding supplement (BOOST HIGH PROTEIN) LIQD Take 1 Container by mouth 2 (two) times daily.     . ferrous sulfate 325 (65 FE) MG tablet Take 325 mg by mouth at bedtime.     Marland Kitchen HYDROcodone-acetaminophen (NORCO) 10-325 MG tablet Take 1 tablet by mouth See admin instructions. Take 1 tablet by mouth two times a day and an additional 0.5 tablet two times a day as needed for pain 30 tablet 0  . isosorbide mononitrate (IMDUR) 30 MG 24 hr tablet Take 0.5 tablets (15 mg total) by mouth daily. (Patient taking differently: Take 15 mg by mouth at bedtime. ) 90 tablet 2  . methocarbamol (ROBAXIN) 500 MG tablet Take 1 tablet  (500 mg total) by mouth every 6 (six) hours as needed for muscle spasms. 30 tablet 0  . metolazone (ZAROXOLYN) 2.5 MG tablet Take 2.5 mg by mouth every Monday, Wednesday, and Friday.     . Multiple Vitamin (MULTIVITAMIN WITH MINERALS) TABS tablet Take 1 tablet by  mouth at bedtime.    . pantoprazole (PROTONIX) 40 MG tablet Take 1 tablet (40 mg total) by mouth 2 (two) times daily. 60 tablet 5  . polyethylene glycol (MIRALAX / GLYCOLAX) packet Take 17 g by mouth at bedtime.    . potassium chloride SA (K-DUR,KLOR-CON) 20 MEQ tablet Start on 10/24/18 75 tablet 3  . torsemide (DEMADEX) 20 MG tablet Take 4 tablets (80 mg total) by mouth 2 (two) times daily. May also take 1 tablet (20 mg total) as needed (for weight greater than 246). (Patient taking differently: Take 80 mg by mouth two times a day and an additional 20 mg as needed for a weight gain >246) 270 tablet 7  . vitamin B-12 (CYANOCOBALAMIN) 500 MCG tablet Take 500 mcg by mouth daily.     No current facility-administered medications for this encounter.    Vitals:   11/28/18 1130  BP: 102/64  Pulse: 98  SpO2: 97%  Weight: 112.8 kg (248 lb 9.6 oz)     Wt Readings from Last 3 Encounters:  11/28/18 112.8 kg (248 lb 9.6 oz)  11/28/18 112.8 kg (248 lb 9.6 oz)  11/18/18 107.8 kg (237 lb 9.6 oz)   PHYSICAL EXAM: General: Elderly appearing. Chronically ill.  HEENT: Normal Neck: Supple. JVP difficult due to body habitus, but appears elevated. Carotids 2+ bilat; no bruits. No thyromegaly or nodule noted. Cor: PMI nondisplaced. RRR, 2/6 AS, 26 TR, 3/6 MR Lungs: CTAB, normal effort. Abdomen: Soft, non-tender, non-distended, no HSM. No bruits or masses. +BS  Extremities: No cyanosis, clubbing, or rash. Chronic venous stasis changes.  Neuro: Alert & orientedx3, cranial nerves grossly intact. moves all 4 extremities w/o difficulty. Affect pleasant   ASSESSMENT & PLAN:  1. Chronic combined systolic/diastolic heart failure  - ECHO 06/21/2018 EF  40-45% LV severely dilated. Mild LVH, akinesis of mid-apicalanteroseptal and apical myocardium, mild AS, severe MR, LA and RA severely dilated, moderate TR, PA peak pressure 42 mmHg. [place] 10/19 showed preserved cardiac output with markedly elevated right and left heart filling pressures (very prominent RV failure). TEE looked like restrictive cardiomyopathy.Repeat echo in 1/20 with EF 30-35% (lower), wall motion abnormalities, moderate MR, moderately dilated RV, moderate TR, PASP 59 mmHg.  Cause of cardiomyopathy uncertain, chronic RV pacing may play a role.  - NYHA III +, confounded by significant arthritis.  - Volume status elevated on exam.  - Continue torsemide 80 mg BID.  - Continue metolazone M/W/F. Take an extra this Saturday.  - Not on Ace/ARB/ARNI/spiro or digoxin with CKD IV.  - Reinforced fluid restriction to < 2 L daily, sodium restriction to less than 2000 mg daily, and the importance of daily weights.    2. Symptomatic Anemia  - Hgb 5.2 on admit. Overall transfused  4U PRBCs. - GI consulted. No plans for scope. GI recommended follow up with outpatient GI.  -On PPI   - EGD and capsule negative 1/20 - Hgb stable at 8.4. - Could consider tagged RBC scan if hgb dropping again.    3. Pleural Effusion, Right - Noted large right pleural effusion on CXR.  - s/p R pleurx cath 11/11/18  - Draining about 500 cc every other day. Continue to drain per facility.    4. CKD Stage IV - Creatinine baseline ~2.5  - Creatinine 2.62 on discharge. BMET today.   5. A fib S/P AV nodal ablation and PPM  - Chronic RV pacing in the 90s. - She has refused anticoagulation in the past and now  also has chronic GI bleeding.  - No change.   6. Hypokalemia - During recent admission.  - K 3.3 on November 26 2018.  - Add 20 meq potassium on Metolazone days.   7. DNR/DNI    Recommended low sodium diet. Recommend compression hose or wraps, and patient adamantly refused due to leg tenderness. She is  limited in her mobility due to significant and severe arthritis. Unfortunately, if Cr does not improve, suspect she may be moving towards HD, which she would likely tolerate very poorly.  Keep follow up with Dr. Jimmy Footman. RTC 6 weeks. Sooner with symptoms.   Shirley Friar, PA-C  11:44 AM   Greater than 50% of the 25 minute visit was spent in counseling/coordination of care regarding disease state education, salt/fluid restriction, sliding scale diuretics, and medication compliance.

## 2018-11-28 NOTE — Progress Notes (Signed)
HPI  Allergies  Allergen Reactions  . Beta Adrenergic Blockers Anaphylaxis and Other (See Comments)    "DEATH" (Bradycardia and her organs shut down)  . Cephalexin Shortness Of Breath  . Codeine Anaphylaxis and Swelling    Throat swelling  . Contrast Media [Iodinated Diagnostic Agents] Other (See Comments)    Patient states "I have chronic kidney disease, so the doctor said no dye in my veins."  . Coreg [Carvedilol] Other (See Comments)    Beta Blockers cause her organs to shut down (per patient)  . Peppermint Flavor Shortness Of Breath  . Prednisone Anaphylaxis, Shortness Of Breath and Swelling    Throat swells   . Tape Other (See Comments)    States plastic tape blisters her skin  . Ciprofloxacin Hives    Tolerating levofloxin   . Latex Itching and Rash  . Penicillins Hives    DID THE REACTION INVOLVE: Swelling of the face/tongue/throat, SOB, or low BP? Yes Sudden or severe rash/hives, skin peeling, or the inside of the mouth or nose? Yes Did it require medical treatment? Pt was in hospital at the time of reaction When did it last happen?Was in her mid-40's If all above answers are "NO", may proceed with cephalosporin use.  . Diamox [Acetazolamide] Rash    Rash after 2 doses of diamox      Current Outpatient Medications  Medication Sig Dispense Refill  . allopurinol (ZYLOPRIM) 300 MG tablet Take 300 mg by mouth every evening.     Marland Kitchen aspirin EC 325 MG tablet Take 325 mg by mouth at bedtime.    . calcitRIOL (ROCALTROL) 0.25 MCG capsule Take 0.25 mcg by mouth every other day.     . clotrimazole-betamethasone (LOTRISONE) cream Apply 1 application topically 2 (two) times daily as needed (for irritation- affected sites).     . COD LIVER OIL PO Take 1 capsule by mouth daily.     Marland Kitchen docusate sodium (COLACE) 100 MG capsule Take 100 mg by mouth daily as needed for mild constipation.     . feeding supplement (BOOST HIGH PROTEIN) LIQD Take 1 Container by mouth 2 (two) times  daily.     . ferrous sulfate 325 (65 FE) MG tablet Take 325 mg by mouth at bedtime.     Marland Kitchen HYDROcodone-acetaminophen (NORCO) 10-325 MG tablet Take 1 tablet by mouth See admin instructions. Take 1 tablet by mouth two times a day and an additional 0.5 tablet two times a day as needed for pain 30 tablet 0  . isosorbide mononitrate (IMDUR) 30 MG 24 hr tablet Take 0.5 tablets (15 mg total) by mouth daily. (Patient taking differently: Take 15 mg by mouth at bedtime. ) 90 tablet 2  . methocarbamol (ROBAXIN) 500 MG tablet Take 1 tablet (500 mg total) by mouth every 6 (six) hours as needed for muscle spasms. 30 tablet 0  . metolazone (ZAROXOLYN) 2.5 MG tablet Take 2.5 mg by mouth every Monday, Wednesday, and Friday.     . Multiple Vitamin (MULTIVITAMIN WITH MINERALS) TABS tablet Take 1 tablet by mouth at bedtime.    . pantoprazole (PROTONIX) 40 MG tablet Take 1 tablet (40 mg total) by mouth 2 (two) times daily. 60 tablet 5  . polyethylene glycol (MIRALAX / GLYCOLAX) packet Take 17 g by mouth at bedtime.    . potassium chloride SA (K-DUR,KLOR-CON) 20 MEQ tablet Start on 10/24/18 (Patient taking differently: Take 20-40 mEq by mouth See admin instructions. Take 40 mEq by mouth in  the morning and 20 mEq in the evening) 75 tablet 3  . torsemide (DEMADEX) 20 MG tablet Take 4 tablets (80 mg total) by mouth 2 (two) times daily. May also take 1 tablet (20 mg total) as needed (for weight greater than 246). (Patient taking differently: Take 80 mg by mouth two times a day and an additional 20 mg as needed for a weight gain >246) 270 tablet 7  . vitamin B-12 (CYANOCOBALAMIN) 500 MCG tablet Take 500 mcg by mouth daily.     No current facility-administered medications for this visit.      Past Medical History:  Diagnosis Date  . Anemia   . Atrial fibrillation ([place])   . Cataract   . Cervical cancer ([place]) 1991   s/p hysterectomy  . Chronic cellulitis   . Chronic combined systolic and diastolic congestive heart failure,  NYHA class 2 (HCC)    LVEF 25-30% with restrictive diastolic filling  . Degenerative joint disease   . Diverticulitis   . Essential hypertension   . GERD (gastroesophageal reflux disease)   . Gout   . Kidney stones   . Morbid obesity ([place])   . NICM (nonischemic cardiomyopathy) ([place]) 02/22/2017  . Osteoarthritis   . Permanent atrial fibrillation 05/04/2009   Qualifier: History of  By: Quentin Cornwall CMA, Janett Billow    . 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 Sinoatrial node dysfunction [facility])    Status post PPM - Dr. Lovena Le  . Sleep apnea    CPAP    ROS:   All systems reviewed and negative except as noted in the HPI.   Past Surgical History:  Procedure Laterality Date  . ABDOMINAL HYSTERECTOMY  1991  . BIOPSY  09/26/2018   Procedure: BIOPSY;  Surgeon: Lavena Bullion, DO;  Location: [place];  Service: Gastroenterology;;  . CHEST TUBE INSERTION Right 11/11/2018   Procedure: INSERTION PLEURAL DRAINAGE CATHETER;  Surgeon: Ivin Poot, MD;  Location: [place];  Service: Thoracic;  Laterality: Right;  . COLONOSCOPY  1998   one polyp per patient  . ESOPHAGOGASTRODUODENOSCOPY (EGD) WITH PROPOFOL N/A 09/26/2018   Procedure: ESOPHAGOGASTRODUODENOSCOPY (EGD) WITH PROPOFOL;  Surgeon: Lavena Bullion, DO;  Location: [place];  Service: Gastroenterology;  Laterality: N/A;  . EYE SURGERY    . GIVENS CAPSULE STUDY N/A 10/09/2018   Procedure: GIVENS CAPSULE STUDY;  Surgeon: Carol Ada, MD;  Location: [place];  Service: Endoscopy;  Laterality: N/A;  . IR THORACENTESIS ASP PLEURAL SPACE W/IMG GUIDE  10/09/2018  . PACEMAKER INSERTION  Nov 2000   St Jude with revision in 2011  . PERCUTANEOUS NEPHROLITHOTOMY  April 2012  . RIGHT HEART CATH N/A 06/28/2018   Procedure: RIGHT HEART CATH;  Surgeon: Jolaine Artist, MD;  Location: [place] [facility];  Service: Cardiovascular;  Laterality: N/A;  . TEE WITHOUT  CARDIOVERSION N/A 06/28/2018   Procedure: TRANSESOPHAGEAL ECHOCARDIOGRAM (TEE);  Surgeon: Jolaine Artist, MD;  Location: [facility] ENDOSCOPY;  Service: Cardiovascular;  Laterality: N/A;     Family History  Problem Relation Age of Onset  . Heart attack Father   . Stroke Mother   . Diabetes Sister   . Colon cancer Other        seven family members  . Liver disease Neg Hx   . Other Neg Hx      Social History   Socioeconomic History  . Marital status: Widowed    Spouse  name: Not on file  . Number of children: 1  . Years of education: Not on file  . Highest education level: Some college, no degree  Occupational History  . Not on file  Social Needs  . Financial resource strain: Not hard at all  . Food insecurity:    Worry: Never true    Inability: Never true  . Transportation needs:    Medical: No    Non-medical: No  Tobacco Use  . Smoking status: Former Smoker    Packs/day: 1.00    Types: Cigarettes    Last attempt to quit: 09/26/1979    Years since quitting: 39.2  . Smokeless tobacco: Never Used  Substance and Sexual Activity  . Alcohol use: No  . Drug use: No  . Sexual activity: Never  Lifestyle  . Physical activity:    Days per week: Not on file    Minutes per session: Not on file  . Stress: Not on file  Relationships  . Social connections:    Talks on phone: Not on file    Gets together: Not on file    Attends religious service: Not on file    Active member of club or organization: Not on file    Attends meetings of clubs or organizations: Not on file    Relationship status: Not on file  . Intimate partner violence:    Fear of current or ex partner: Not on file    Emotionally abused: Not on file    Physically abused: Not on file    Forced sexual activity: Not on file  Other Topics Concern  . Not on file  Social History Narrative  . Not on file     BP 98/60   Pulse 92   Ht 5' 6"  (1.676 m)   Wt 248 lb 9.6 oz (112.8 kg)   SpO2 97%   BMI 40.13 kg/m     Physical Exam:  Well appearing NAD HEENT: Unremarkable Neck:  No JVD, no thyromegally Lymphatics:  No adenopathy Back:  No CVA tenderness Lungs:  Clear HEART:  Regular rate rhythm, no murmurs, no rubs, no clicks Abd:  soft, positive bowel sounds, no organomegally, no rebound, no guarding Ext:  2 plus pulses, no edema, no cyanosis, no clubbing Skin:  No rashes no nodules Neuro:  CN II through XII intact, motor grossly intact  DEVICE  Normal device function.  See PaceArt for details.   Assess/Plan: 1. Atrial fib - her rate is controlled. She is asymptomatic, s/p PPM 2. PPM - her St.Jude PPM is working normally and she has 5 years of battery longevity. 3. Chronic systolic/diastolic heart failure - she is class 3. She has been getting too much sodium at her SNF and I have asked them to reduce this. 4. Chronic pleural effusion - she has a drain in the right chest. She has the fluid removed regularly.  Mikle Bosworth.D.

## 2018-11-28 NOTE — Patient Instructions (Signed)
Labs needed next week. Will be done at [facility]  Take an extra Metolazone on Saturday 11/30/18 ADD additional 20 meq with every metolazone dose  Your physician recommends that you schedule a follow-up appointment in: 6-8 weeks  in the [facility] Practition[facility] (PA/NP) Clinic

## 2018-11-29 ENCOUNTER — Other Ambulatory Visit: Payer: Self-pay | Admitting: Cardiothoracic Surgery

## 2018-11-29 DIAGNOSIS — J90 Pleural effusion, not elsewhere classified: Secondary | ICD-10-CM

## 2018-11-29 DIAGNOSIS — G4733 Obstructive sleep apnea (adult) (pediatric): Secondary | ICD-10-CM | POA: Diagnosis not present

## 2018-11-29 DIAGNOSIS — I5022 Chronic systolic (congestive) heart failure: Secondary | ICD-10-CM | POA: Diagnosis not present

## 2018-11-29 DIAGNOSIS — R5381 Other malaise: Secondary | ICD-10-CM | POA: Diagnosis not present

## 2018-12-02 ENCOUNTER — Ambulatory Visit (INDEPENDENT_AMBULATORY_CARE_PROVIDER_SITE_OTHER): Payer: Medicare Other | Admitting: Physician Assistant

## 2018-12-02 ENCOUNTER — Ambulatory Visit
Admission: RE | Admit: 2018-12-02 | Discharge: 2018-12-02 | Disposition: A | Discharge status: Home / Self Care | Payer: Medicare Other | Source: Ambulatory Visit | Attending: Cardiothoracic Surgery | Admitting: Cardiothoracic Surgery

## 2018-12-02 ENCOUNTER — Encounter: Payer: Self-pay | Admitting: Physician Assistant

## 2018-12-02 ENCOUNTER — Other Ambulatory Visit: Payer: Self-pay | Admitting: Cardiothoracic Surgery

## 2018-12-02 VITALS — BP 104/52 | HR 98 | Resp 18 | Ht 66.0 in | Wt 248.0 lb

## 2018-12-02 DIAGNOSIS — K7460 Unspecified cirrhosis of liver: Secondary | ICD-10-CM | POA: Diagnosis not present

## 2018-12-02 DIAGNOSIS — N184 Chronic kidney disease, stage 4 (severe): Secondary | ICD-10-CM | POA: Diagnosis not present

## 2018-12-02 DIAGNOSIS — J90 Pleural effusion, not elsewhere classified: Secondary | ICD-10-CM | POA: Diagnosis not present

## 2018-12-02 DIAGNOSIS — I5042 Chronic combined systolic (congestive) and diastolic (congestive) heart failure: Secondary | ICD-10-CM | POA: Diagnosis not present

## 2018-12-02 DIAGNOSIS — I482 Chronic atrial fibrillation, unspecified: Secondary | ICD-10-CM

## 2018-12-02 NOTE — Patient Instructions (Signed)
Continue Pleurx drainage every other day.  If drainage decreases to less than 300 ml's, contact us for further instructions.

## 2018-12-02 NOTE — Progress Notes (Signed)
HPI: [name] [name] is a 73 year old female with a history of chronic atrial fibrillation, combined diastolic and systolic heart failure, hypertension, and stage IV chronic kidney disease.  We were asked to see her in the hospital in consultation last month for assistance with managing a chronic right pleural effusion.  She eventually underwent left Pleurx catheter placement by Dr. Darcey Nora on 11/11/2018.  Since her discharge from that admission to a skilled nursing facility, the Pleurx catheter has been drained every other day yielding about 500 mL each time.  She presents today for scheduled follow-up. [name] [name] reports no new changes today.  Her respiratory status is about the same as when she was discharged from the hospital.  She has had no chest pain or fevers.      Current Outpatient Medications  Medication Sig Dispense Refill  . allopurinol (ZYLOPRIM) 300 MG tablet Take 300 mg by mouth every evening.     Marland Kitchen aspirin EC 325 MG tablet Take 325 mg by mouth at bedtime.    . calcitRIOL (ROCALTROL) 0.25 MCG capsule Take 0.25 mcg by mouth every other day.     . clotrimazole-betamethasone (LOTRISONE) cream Apply 1 application topically 2 (two) times daily as needed (for irritation- affected sites).     . COD LIVER OIL PO Take 1 capsule by mouth daily.     Marland Kitchen docusate sodium (COLACE) 100 MG capsule Take 100 mg by mouth daily as needed for mild constipation.     . feeding supplement (BOOST HIGH PROTEIN) LIQD Take 1 Container by mouth 2 (two) times daily.     . ferrous sulfate 325 (65 FE) MG tablet Take 325 mg by mouth at bedtime.     Marland Kitchen HYDROcodone-acetaminophen (NORCO) 10-325 MG tablet Take 1 tablet by mouth See admin instructions. Take 1 tablet by mouth two times a day and an additional 0.5 tablet two times a day as needed for pain 30 tablet 0  . isosorbide mononitrate (IMDUR) 30 MG 24 hr tablet Take 0.5 tablets (15 mg total) by mouth daily. (Patient taking differently: Take 15 mg by mouth at bedtime.  ) 90 tablet 2  . methocarbamol (ROBAXIN) 500 MG tablet Take 1 tablet (500 mg total) by mouth every 6 (six) hours as needed for muscle spasms. 30 tablet 0  . metolazone (ZAROXOLYN) 2.5 MG tablet Take 2.5 mg by mouth every Monday, Wednesday, and Friday.     . Multiple Vitamin (MULTIVITAMIN WITH MINERALS) TABS tablet Take 1 tablet by mouth at bedtime.    . pantoprazole (PROTONIX) 40 MG tablet Take 1 tablet (40 mg total) by mouth 2 (two) times daily. 60 tablet 5  . polyethylene glycol (MIRALAX / GLYCOLAX) packet Take 17 g by mouth at bedtime.    . potassium chloride SA (K-DUR,KLOR-CON) 20 MEQ tablet Start on 10/24/18 75 tablet 3  . torsemide (DEMADEX) 20 MG tablet Take 4 tablets (80 mg total) by mouth 2 (two) times daily. May also take 1 tablet (20 mg total) as needed (for weight greater than 246). (Patient taking differently: Take 80 mg by mouth two times a day and an additional 20 mg as needed for a weight gain >246) 270 tablet 7  . vitamin B-12 (CYANOCOBALAMIN) 500 MCG tablet Take 500 mcg by mouth daily.     No current facility-administered medications for this visit.     Physical Exam: VS: Blood pressure 104/52  Pulse 98  Respirations 18 General: [name] was brought into the office via wheelchair.  She is attended  by family member.  She is alert and oriented, skin is warm and dry.  She is not appear dyspneic. Heart: Heart sounds are distant, irregularly irregular rhythm Chest breath sounds are clear anterior and posterior but remained diminished in the right base.  The Pleurx catheter is well secured over the right lateral chest.  The counter incision is well-healed.  Sutures were removed from the counterincision in the next tubing.  Sterile dressing was reapplied after draining 550 mL's of fluid from the catheter. Extremities: Chronic lower extremity edema with venous stasis changes       Diagnostic Tests: EXAM: CHEST  1 VIEW  COMPARISON:  11/18/2018  FINDINGS: Stable position  of single lead cardiac pacemaker/defibrillator and a catheter overlying the right lower thorax.  Enlarged cardiac silhouette. Mediastinal contours appear intact. Calcific atherosclerotic disease of the aorta.  There is no evidence of pneumothorax. Persistent small right pleural effusion. Probable mild interstitial pulmonary edema.  Osseous structures are without acute abnormality. Soft tissues are grossly normal.  IMPRESSION: 1. Persistent small right pleural effusion. 2. Probable mild interstitial pulmonary edema.   Electronically Signed   By: Fidela Salisbury M.D.   On: 12/02/2018 08:37  Impression / Plan: 73 year old female with multiple medical problems clean chronic and diastolic and systolic heart failure, hypertension, stage IV chronic kidney disease, hepatic cirrhosis, and chronic atrial fibrillation presents for follow-up after Pleurx catheter placement in the operating room by Dr. Darcey Nora approximately 3 weeks ago.  She is currently residing in a skilled nursing facility and the Pleurx catheter is being drained every other day.  It was last drained yesterday yielding 500 mL of fluid.    Chest x-ray obtained earlier this morning was reviewed and shows the Pleurx catheter in appropriate position low in the right hemithorax.  There was minor blunting of the right costophrenic angle consistent with a small pleural effusion today in the office, the Pleurx was drained today yielding 550 mL's of thin, amber serous fluid.  Sutures were removed from the counterincision at the insertion site as well as from the tubing itself.  The site was redressed with a sterile Pleurx catheter dressing. Instructions were relayed to the facility to continue with every other day change at this point.  If the drainage decreases significantly over the next few weeks, I asked him to contact us for further instructions.  Otherwise, we will plan to see her in the office in 1 month with a follow-up chest  x-ray.   Antony Odea, PA-C [facility] [phone]

## 2018-12-03 LAB — CUP PACEART INCLINIC DEVICE CHECK
Battery Remaining Longevity: 62 mo
Battery Voltage: 2.87 V
Brady Statistic RV Percent Paced: 99 %
Date Time Interrogation Session: 20200305161935
Implantable Lead Implant Date: 20001110
Implantable Lead Location: 753860
Lead Channel Pacing Threshold Amplitude: 0.75 V
Lead Channel Pacing Threshold Pulse Width: 0.4 ms
Lead Channel Setting Pacing Amplitude: 1 V
Lead Channel Setting Pacing Pulse Width: 0.4 ms
Lead Channel Setting Sensing Sensitivity: 2 mV
MDC IDC MSMT LEADCHNL RV IMPEDANCE VALUE: 525 Ohm
MDC IDC PG IMPLANT DT: 20111202
Pulse Gen Model: 1210
Pulse Gen Serial Number: 7171350

## 2018-12-04 ENCOUNTER — Other Ambulatory Visit (HOSPITAL_COMMUNITY): Payer: Self-pay | Admitting: Internal Medicine

## 2018-12-05 ENCOUNTER — Other Ambulatory Visit: Payer: Self-pay

## 2018-12-05 ENCOUNTER — Ambulatory Visit: Payer: Medicare Other | Admitting: Physician Assistant

## 2018-12-05 ENCOUNTER — Inpatient Hospital Stay (HOSPITAL_COMMUNITY)
Admission: EM | Admit: 2018-12-05 | Discharge: 2018-12-11 | DRG: 378 | Disposition: A | Discharge status: Home Health | Payer: Medicare Other | Source: Skilled Nursing Facility | Attending: Family Medicine | Admitting: Family Medicine

## 2018-12-05 DIAGNOSIS — K7581 Nonalcoholic steatohepatitis (NASH): Secondary | ICD-10-CM | POA: Diagnosis present

## 2018-12-05 DIAGNOSIS — Z880 Allergy status to penicillin: Secondary | ICD-10-CM

## 2018-12-05 DIAGNOSIS — S80822A Blister (nonthermal), left lower leg, initial encounter: Secondary | ICD-10-CM | POA: Diagnosis present

## 2018-12-05 DIAGNOSIS — D62 Acute posthemorrhagic anemia: Secondary | ICD-10-CM | POA: Diagnosis present

## 2018-12-05 DIAGNOSIS — E8889 Other specified metabolic disorders: Secondary | ICD-10-CM | POA: Diagnosis present

## 2018-12-05 DIAGNOSIS — I130 Hypertensive heart and chronic kidney disease with heart failure and stage 1 through stage 4 chronic kidney disease, or unspecified chronic kidney disease: Secondary | ICD-10-CM | POA: Diagnosis not present

## 2018-12-05 DIAGNOSIS — K7460 Unspecified cirrhosis of liver: Secondary | ICD-10-CM | POA: Diagnosis present

## 2018-12-05 DIAGNOSIS — D649 Anemia, unspecified: Secondary | ICD-10-CM | POA: Diagnosis not present

## 2018-12-05 DIAGNOSIS — M109 Gout, unspecified: Secondary | ICD-10-CM | POA: Diagnosis present

## 2018-12-05 DIAGNOSIS — I959 Hypotension, unspecified: Secondary | ICD-10-CM | POA: Diagnosis present

## 2018-12-05 DIAGNOSIS — S80821A Blister (nonthermal), right lower leg, initial encounter: Secondary | ICD-10-CM | POA: Diagnosis present

## 2018-12-05 DIAGNOSIS — Z9102 Food additives allergy status: Secondary | ICD-10-CM

## 2018-12-05 DIAGNOSIS — Z823 Family history of stroke: Secondary | ICD-10-CM

## 2018-12-05 DIAGNOSIS — Z90710 Acquired absence of both cervix and uterus: Secondary | ICD-10-CM

## 2018-12-05 DIAGNOSIS — I428 Other cardiomyopathies: Secondary | ICD-10-CM | POA: Diagnosis not present

## 2018-12-05 DIAGNOSIS — Z993 Dependence on wheelchair: Secondary | ICD-10-CM

## 2018-12-05 DIAGNOSIS — Z833 Family history of diabetes mellitus: Secondary | ICD-10-CM

## 2018-12-05 DIAGNOSIS — D631 Anemia in chronic kidney disease: Secondary | ICD-10-CM | POA: Diagnosis present

## 2018-12-05 DIAGNOSIS — I5042 Chronic combined systolic (congestive) and diastolic (congestive) heart failure: Secondary | ICD-10-CM | POA: Diagnosis not present

## 2018-12-05 DIAGNOSIS — N185 Chronic kidney disease, stage 5: Secondary | ICD-10-CM | POA: Diagnosis present

## 2018-12-05 DIAGNOSIS — Z800 Family history of malignant neoplasm of digestive organs: Secondary | ICD-10-CM

## 2018-12-05 DIAGNOSIS — D500 Iron deficiency anemia secondary to blood loss (chronic): Secondary | ICD-10-CM | POA: Diagnosis present

## 2018-12-05 DIAGNOSIS — Z7982 Long term (current) use of aspirin: Secondary | ICD-10-CM

## 2018-12-05 DIAGNOSIS — Z8541 Personal history of malignant neoplasm of cervix uteri: Secondary | ICD-10-CM

## 2018-12-05 DIAGNOSIS — I340 Nonrheumatic mitral (valve) insufficiency: Secondary | ICD-10-CM | POA: Diagnosis present

## 2018-12-05 DIAGNOSIS — K922 Gastrointestinal hemorrhage, unspecified: Secondary | ICD-10-CM | POA: Diagnosis not present

## 2018-12-05 DIAGNOSIS — I4821 Permanent atrial fibrillation: Secondary | ICD-10-CM | POA: Diagnosis present

## 2018-12-05 DIAGNOSIS — Z8249 Family history of ischemic heart disease and other diseases of the circulatory system: Secondary | ICD-10-CM

## 2018-12-05 DIAGNOSIS — Z87891 Personal history of nicotine dependence: Secondary | ICD-10-CM

## 2018-12-05 DIAGNOSIS — M1990 Unspecified osteoarthritis, unspecified site: Secondary | ICD-10-CM | POA: Diagnosis present

## 2018-12-05 DIAGNOSIS — R5381 Other malaise: Secondary | ICD-10-CM | POA: Diagnosis present

## 2018-12-05 DIAGNOSIS — K219 Gastro-esophageal reflux disease without esophagitis: Secondary | ICD-10-CM | POA: Diagnosis present

## 2018-12-05 DIAGNOSIS — N2581 Secondary hyperparathyroidism of renal origin: Secondary | ICD-10-CM | POA: Diagnosis present

## 2018-12-05 DIAGNOSIS — Z79899 Other long term (current) drug therapy: Secondary | ICD-10-CM

## 2018-12-05 DIAGNOSIS — X58XXXA Exposure to other specified factors, initial encounter: Secondary | ICD-10-CM | POA: Diagnosis present

## 2018-12-05 DIAGNOSIS — Z91041 Radiographic dye allergy status: Secondary | ICD-10-CM

## 2018-12-05 DIAGNOSIS — I10 Essential (primary) hypertension: Secondary | ICD-10-CM | POA: Diagnosis not present

## 2018-12-05 DIAGNOSIS — E876 Hypokalemia: Secondary | ICD-10-CM | POA: Diagnosis not present

## 2018-12-05 DIAGNOSIS — Z888 Allergy status to other drugs, medicaments and biological substances status: Secondary | ICD-10-CM

## 2018-12-05 DIAGNOSIS — Z79891 Long term (current) use of opiate analgesic: Secondary | ICD-10-CM

## 2018-12-05 DIAGNOSIS — Z950 Presence of cardiac pacemaker: Secondary | ICD-10-CM

## 2018-12-05 DIAGNOSIS — G4733 Obstructive sleep apnea (adult) (pediatric): Secondary | ICD-10-CM | POA: Diagnosis present

## 2018-12-05 DIAGNOSIS — Z66 Do not resuscitate: Secondary | ICD-10-CM | POA: Diagnosis present

## 2018-12-05 DIAGNOSIS — I5082 Biventricular heart failure: Secondary | ICD-10-CM | POA: Diagnosis present

## 2018-12-05 DIAGNOSIS — N184 Chronic kidney disease, stage 4 (severe): Secondary | ICD-10-CM | POA: Diagnosis present

## 2018-12-05 DIAGNOSIS — Z91040 Latex allergy status: Secondary | ICD-10-CM

## 2018-12-05 DIAGNOSIS — E611 Iron deficiency: Secondary | ICD-10-CM | POA: Diagnosis present

## 2018-12-05 DIAGNOSIS — Z87442 Personal history of urinary calculi: Secondary | ICD-10-CM

## 2018-12-05 DIAGNOSIS — Z6841 Body Mass Index (BMI) 40.0 and over, adult: Secondary | ICD-10-CM | POA: Diagnosis not present

## 2018-12-05 DIAGNOSIS — I2510 Atherosclerotic heart disease of native coronary artery without angina pectoris: Secondary | ICD-10-CM | POA: Diagnosis present

## 2018-12-05 LAB — COMPREHENSIVE METABOLIC PANEL
ALBUMIN: 3 g/dL — AB (ref 3.5–5.0)
ALK PHOS: 59 U/L (ref 38–126)
ALT: 16 U/L (ref 0–44)
AST: 22 U/L (ref 15–41)
Anion gap: 9 (ref 5–15)
BUN: 102 mg/dL — ABNORMAL HIGH (ref 8–23)
CO2: 30 mmol/L (ref 22–32)
Calcium: 8.8 mg/dL — ABNORMAL LOW (ref 8.9–10.3)
Chloride: 94 mmol/L — ABNORMAL LOW (ref 98–111)
Creatinine, Ser: 3.2 mg/dL — ABNORMAL HIGH (ref 0.44–1.00)
GFR calc Af Amer: 16 mL/min — ABNORMAL LOW (ref >60)
GFR calc non Af Amer: 14 mL/min — ABNORMAL LOW (ref >60)
Glucose, Bld: 108 mg/dL — ABNORMAL HIGH (ref 70–99)
Potassium: 3.2 mmol/L — ABNORMAL LOW (ref 3.5–5.1)
Sodium: 133 mmol/L — ABNORMAL LOW (ref 135–145)
Total Bilirubin: 0.6 mg/dL (ref 0.3–1.2)
Total Protein: 5.6 g/dL — ABNORMAL LOW (ref 6.5–8.1)

## 2018-12-05 LAB — CBC
HCT: 23 % — ABNORMAL LOW (ref 36.0–46.0)
Hemoglobin: 6.9 g/dL — CL (ref 12.0–15.0)
MCH: 29.2 pg (ref 26.0–34.0)
MCHC: 30 g/dL (ref 30.0–36.0)
MCV: 97.5 fL (ref 80.0–100.0)
Platelets: 153 10*3/uL (ref 150–400)
RBC: 2.36 MIL/uL — ABNORMAL LOW (ref 3.87–5.11)
RDW: 18.6 % — ABNORMAL HIGH (ref 11.5–15.5)
WBC: 5 10*3/uL (ref 4.0–10.5)
nRBC: 0 % (ref 0.0–0.2)

## 2018-12-05 LAB — PREPARE RBC (CROSSMATCH)

## 2018-12-05 LAB — MRSA PCR SCREENING: MRSA by PCR: NEGATIVE

## 2018-12-05 LAB — I-STAT TROPONIN, ED: Troponin i, poc: 0.03 ng/mL (ref 0.00–0.08)

## 2018-12-05 MED ORDER — SODIUM CHLORIDE 0.9% FLUSH: 3.0000 mL | INTRAVENOUS | Status: DC | PRN

## 2018-12-05 MED ORDER — SODIUM CHLORIDE 0.9 % IV SOLN
10.0000 mL/h | Freq: Once | INTRAVENOUS | Status: AC
  Administered 2018-12-08: 10 mL/h via INTRAVENOUS

## 2018-12-05 MED ORDER — POTASSIUM CHLORIDE CRYS ER 20 MEQ PO TBCR
40.0000 meq | EXTENDED_RELEASE_TABLET | Freq: Once | ORAL | Status: AC
  Administered 2018-12-05: 20 meq via ORAL
  Filled 2018-12-05: qty 2

## 2018-12-05 MED ORDER — SODIUM CHLORIDE 0.9 % IV BOLUS
250.0000 mL | Freq: Once | INTRAVENOUS | Status: AC
  Administered 2018-12-05: 250 mL via INTRAVENOUS

## 2018-12-05 MED ORDER — SODIUM CHLORIDE 0.9% FLUSH
3.0000 mL | Freq: Two times a day (BID) | INTRAVENOUS | Status: DC
  Administered 2018-12-05 – 2018-12-11 (×11): 3 mL via INTRAVENOUS

## 2018-12-05 MED ORDER — HYDROCODONE-ACETAMINOPHEN 10-325 MG PO TABS
1.0000 | ORAL_TABLET | Freq: Four times a day (QID) | ORAL | Status: DC | PRN
  Administered 2018-12-06 – 2018-12-07 (×2): 1 via ORAL
  Filled 2018-12-05 (×3): qty 1

## 2018-12-05 MED ORDER — POTASSIUM CHLORIDE CRYS ER 20 MEQ PO TBCR: 40.0000 meq | EXTENDED_RELEASE_TABLET | Freq: Once | ORAL | Status: DC

## 2018-12-05 MED ORDER — SODIUM CHLORIDE 0.9 % IV SOLN: 250.0000 mL | INTRAVENOUS | Status: DC | PRN

## 2018-12-05 NOTE — ED Notes (Signed)
HGB 6.9 critical lab called-- notified MD

## 2018-12-05 NOTE — ED Notes (Signed)
Dinner tray delivered.

## 2018-12-05 NOTE — H&P (Signed)
History and Physical    [name] [name] ZOX:[id number] DOB: [birth year] DOA: 12/05/2018  PCP: [name], MD  Patient coming from: Nursing facility in [place]  I have personally briefly reviewed patient's old medical records in [place]  Chief Complaint: Low hemoglobin  HPI: [name] [name] is a 73 y.o. female with medical history significant of just of heart failure, atrial fibrillation, cirrhosis, kidney disease, GI bleeding recently admitted to our facility February 11 through February 24 of this year for acute anemia.  She was found to have a slow GI bleed thought to be coming from her colon.  Upper endoscopy was done and there was no ulcerations noted.  Patient felt to be too unstable to proceed with colonoscopy at that time.  He was transfused and upon discharge had a hemoglobin of 8.8.  Went to rehab and today was found to have a hemoglobin of 6.6 and referred to [place] for further evaluation and management.  Patient's only complaint is that she feels "blah"  ED Course: [place] low transfusion ordered.  Patient with multiple antibodies and blood needs to come from [place].  Review of Systems: As per HPI otherwise all other systems reviewed and  negative.   Past Medical History:  Diagnosis Date  . Anemia   . Atrial fibrillation ([place])   . Cataract   . Cervical cancer ([place]) 1991   s/p hysterectomy  . Chronic cellulitis   . Chronic combined systolic and diastolic congestive heart failure, NYHA class 2 (HCC)    LVEF 25-30% with restrictive diastolic filling  . Degenerative joint disease   . Diverticulitis   . Essential hypertension   . GERD (gastroesophageal reflux disease)   . Gout   . Kidney stones   . Morbid obesity ([place])   . NICM (nonischemic cardiomyopathy) ([place]) 02/22/2017  . Osteoarthritis   . Permanent atrial fibrillation 05/04/2009   Qualifier: History of  By: Quentin Cornwall CMA, Janett Billow    . 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 Sinoatrial node dysfunction [facility])    Status post PPM - Dr. Lovena Le  . Sleep apnea    CPAP    Past Surgical History:  Procedure Laterality Date  . ABDOMINAL HYSTERECTOMY  1991  . BIOPSY  09/26/2018   Procedure: BIOPSY;  Surgeon: Lavena Bullion, DO;  Location: [place];  Service: Gastroenterology;;  . CHEST TUBE INSERTION Right 11/11/2018   Procedure: INSERTION PLEURAL DRAINAGE CATHETER;  Surgeon: Ivin Poot, MD;  Location: [place];  Service: Thoracic;  Laterality: Right;  . COLONOSCOPY  1998   one polyp per patient  . ESOPHAGOGASTRODUODENOSCOPY (EGD) WITH PROPOFOL N/A 09/26/2018   Procedure: ESOPHAGOGASTRODUODENOSCOPY (EGD) WITH PROPOFOL;  Surgeon: Lavena Bullion, DO;  Location: [place];  Service: Gastroenterology;  Laterality: N/A;  . EYE SURGERY    . GIVENS CAPSULE STUDY N/A 10/09/2018   Procedure: GIVENS CAPSULE STUDY;  Surgeon: Carol Ada, MD;  Location: [facility];  Service: Endoscopy;  Laterality: N/A;  . IR THORACENTESIS ASP PLEURAL SPACE W/IMG GUIDE  10/09/2018  . PACEMAKER INSERTION  Nov 2000   St Jude with revision in 2011  . PERCUTANEOUS NEPHROLITHOTOMY  April 2012  . RIGHT HEART CATH N/A 06/28/2018   Procedure: RIGHT HEART CATH;  Surgeon: Jolaine Artist, MD;  Location: [place] CV LAB;  Service: Cardiovascular;  Laterality: N/A;  . TEE WITHOUT CARDIOVERSION N/A 06/28/2018   Procedure: TRANSESOPHAGEAL ECHOCARDIOGRAM (  TEE);  Surgeon: Jolaine Artist, MD;  Location: [facility] ENDOSCOPY;  Service: Cardiovascular;  Laterality: N/A;    Social History   Social History Narrative  . Not on file     reports that she quit smoking about 39 years ago. Her smoking use included cigarettes. She smoked 1.00 pack per day. She has never used smokeless tobacco. She reports that she does not drink alcohol or use drugs.  Allergies  Allergen Reactions  . Beta Adrenergic Blockers Anaphylaxis and Other (See  Comments)    "DEATH" (Bradycardia and her organs shut down)  . Cephalexin Shortness Of Breath  . Codeine Anaphylaxis and Swelling    Throat swelling  . Contrast Media [Iodinated Diagnostic Agents] Other (See Comments)    Patient states "I have chronic kidney disease, so the doctor said no dye in my veins."  . Coreg [Carvedilol] Other (See Comments)    Beta Blockers cause her organs to shut down (per patient)  . Peppermint Flavor Shortness Of Breath  . Prednisone Anaphylaxis, Shortness Of Breath and Swelling    Throat swells   . Tape Other (See Comments)    States plastic tape blisters her skin  . Ciprofloxacin Hives    Tolerating levofloxin   . Latex Itching and Rash  . Penicillins Hives    DID THE REACTION INVOLVE: Swelling of the face/tongue/throat, SOB, or low BP? Yes Sudden or severe rash/hives, skin peeling, or the inside of the mouth or nose? Yes Did it require medical treatment? Pt was in hospital at the time of reaction When did it last happen?Was in her mid-40's If all above answers are "NO", may proceed with cephalosporin use.  . Diamox [Acetazolamide] Rash    Rash after 2 doses of diamox     Family History  Problem Relation Age of Onset  . Heart attack Father   . Stroke Mother   . Diabetes Sister   . Colon cancer Other        seven family members  . Liver disease Neg Hx   . Other Neg Hx     Prior to Admission medications   Medication Sig Start Date End Date Taking? Authorizing Provider  allopurinol (ZYLOPRIM) 300 MG tablet Take 300 mg by mouth every evening.    Yes [provider]  aspirin EC 325 MG tablet Take 325 mg by mouth at bedtime.   Yes [provider]  calcitRIOL (ROCALTROL) 0.25 MCG capsule Take 0.25 mcg by mouth every other day.    Yes [provider]  cholecalciferol (VITAMIN D3) 25 MCG (1000 UT) tablet Take 1,000 Units by mouth daily.   Yes [provider]  clotrimazole-betamethasone (LOTRISONE) cream Apply 1  application topically 2 (two) times daily as needed (for irritation- affected sites).  07/26/18  Yes [provider]  COD LIVER OIL PO Take 1 capsule by mouth daily.    Yes [provider]  docusate sodium (COLACE) 100 MG capsule Take 100 mg by mouth daily as needed for mild constipation.    Yes [provider]  feeding supplement (BOOST HIGH PROTEIN) LIQD Take 1 Container by mouth 2 (two) times daily.    Yes [provider]  ferrous sulfate 325 (65 FE) MG tablet Take 325 mg by mouth at bedtime.    Yes [provider]  HYDROcodone-acetaminophen (NORCO) 10-325 MG tablet Take 1 tablet by mouth See admin instructions. Take 1 tablet by mouth two times a day and an additional 0.5 tablet two times a  day as needed for pain 11/15/18  Yes Gherghe, Vella Redhead, MD  isosorbide mononitrate (IMDUR) 30 MG 24 hr tablet Take 0.5 tablets (15 mg total) by mouth daily. Patient taking differently: Take 15 mg by mouth at bedtime.  12/10/17  Yes Fay Records, MD  methocarbamol (ROBAXIN) 500 MG tablet Take 1 tablet (500 mg total) by mouth every 6 (six) hours as needed for muscle spasms. 10/18/18  Yes Shelly Coss, MD  metolazone (ZAROXOLYN) 2.5 MG tablet Take 2.5 mg by mouth every Monday, Wednesday, and Friday.    Yes [provider]  Misc. Devices MISC 1 each by Does not apply route at bedtime. C-PAP   Yes [provider]  Multiple Vitamin (MULTIVITAMIN WITH MINERALS) TABS tablet Take 1 tablet by mouth at bedtime.   Yes [provider]  pantoprazole (PROTONIX) 40 MG tablet Take 1 tablet (40 mg total) by mouth 2 (two) times daily. 10/02/18  Yes Mahala Menghini, PA-C  polyethylene glycol (MIRALAX / GLYCOLAX) packet Take 17 g by mouth at bedtime.   Yes [provider]  potassium chloride SA (K-DUR,KLOR-CON) 20 MEQ tablet Start on 10/24/18 Patient taking differently: Take 20 mEq by mouth 3 (three) times a week. Taking 63mq twice daily = 470m on  Monday, Wednesday, Friday. 10/18/18  Yes AdShelly CossMD  torsemide (DEMADEX) 20 MG tablet Take 4 tablets (80 mg total) by mouth 2 (two) times daily. May also take 1 tablet (20 mg total) as needed (for weight greater than 246). Patient taking differently: Take 80 mg by mouth two times a day and an additional 20 mg as needed for a weight gain >246 07/15/18  Yes Clegg, Amy D, NP  vitamin B-12 (CYANOCOBALAMIN) 500 MCG tablet Take 500 mcg by mouth daily.   Yes [provider]    Physical Exam:  Constitutional: NAD, calm, comfortable Vitals:   12/05/18 1250 12/05/18 1433 12/05/18 1557  BP: (!) 105/53 (!) 86/52 (!) 102/53  Pulse: 90  97  Resp: 18  16  Temp: 98.1 F (36.7 C)    TempSrc: Oral    SpO2: 100%  96%   Eyes: PERRL, lids normal and conjunctivae pale ENMT: Mucous membranes are moist and pale. Posterior pharynx clear of any exudate or lesions.Normal dentition.  Neck: normal, supple, no masses, no thyromegaly Respiratory: clear to auscultation bilaterally, no wheezing, no crackles. Normal respiratory effort. No accessory muscle use.  Cardiovascular: Regular rate and rhythm, no murmurs / rubs / gallops.  2+ chronic extremity edema. 2+ pedal pulses. No carotid bruits.  Abdomen: no tenderness, no masses palpated. No hepatosplenomegaly. Bowel sounds positive.  Musculoskeletal: no clubbing / cyanosis. No joint deformity upper and lower extremities. Good ROM, no contractures. Normal muscle tone.  Skin: no rashes, lesions, ulcers. No induration Neurologic: CN 2-12 grossly intact. Sensation intact, DTR normal. Strength 5/5 in all 4.  Psychiatric: Normal judgment and insight. Alert and oriented x 3. Normal mood.    Labs on Admission: I have personally reviewed following labs and imaging studies  CBC: Recent Labs  Lab 12/05/18 1304  WBC 5.0  HGB 6.9*  HCT 23.0*  MCV 97.5  PLT 15810 Basic Metabolic Panel: Recent Labs  Lab 12/05/18 1304  NA 133*  K 3.2*  CL 94*  CO2  30  GLUCOSE 108*  BUN 102*  CREATININE 3.20*  CALCIUM 8.8*   GFR: Estimated Creatinine Clearance: 19.9 mL/min (A) (by C-G formula based on SCr of 3.2 mg/dL (H)). Liver Function Tests:  Recent Labs  Lab 12/05/18 1304  AST 22  ALT 16  ALKPHOS 59  BILITOT 0.6  PROT 5.6*  ALBUMIN 3.0*   Urine analysis:    Component Value Date/Time   COLORURINE YELLOW 11/09/2018 [place] 11/09/2018 1653   LABSPEC 1.009 11/09/2018 1653   PHURINE 7.0 11/09/2018 1653   GLUCOSEU NEGATIVE 11/09/2018 1653   HGBUR NEGATIVE 11/09/2018 [place] 11/09/2018 [place] 11/09/2018 1653   PROTEINUR NEGATIVE 11/09/2018 1653   UROBILINOGEN 0.2 03/06/2013 2035   NITRITE NEGATIVE 11/09/2018 1653   LEUKOCYTESUR SMALL (A) 11/09/2018 1653    Radiological Exams on Admission: No results found.  EKG: Independently reviewed.  Paced rhythm with increased PR interval.  Change from prior.  Assessment/Plan Active Problems:   Chronic blood loss anemia    1.  Blood loss anemia in a patient with multiple complicated medical problems including congestive heart failure, kidney disease, cirrhosis, and occult GI bleeding thought perhaps due to colon causes.  Patient will be transfused 1 unit of packed red blood cells.  Her baseline hemoglobin is 8.8.  To her overnight given her multiple medical problems.  Difficulty obtaining blood due to they have to get it from the blood bank in [place].  Patient to be stable for discharge after transfusion.  DVT prophylaxis: SCDs Code Status: DNR as previous Family Communication: Patient retains capacity.  No family present. Disposition Plan: Back to skilled facility in [place] called: None Admission status: Observation   Lady Deutscher MD FACP [facility] Pager [phone]  How to contact the [facility] Attending or Consulting provider [place] or covering provider during after hours [place], for this  patient?  1. Check the care team in [facility] and look for a) attending/consulting [facility] provider listed and b) the [facility] team listed 2. Log into [email or web address] and use [place]'s universal password to access. If you do not have the password, please contact the hospital operator. 3. Locate the [facility] provider you are looking for under [facility] and page to a number that you can be directly reached. 4. If you still have difficulty reaching the provider, please page the [facility] (Director on Call) for the Hospitalists listed on amion for assistance.  If 7PM-7AM, please contact night-coverage [email or web address] Password [facility]  12/05/2018, 4:21 PM

## 2018-12-05 NOTE — ED Notes (Signed)
ED TO INPATIENT HANDOFF REPORT  ED Nurse Name and Phone #: Lovena Le [phone]  S Name/Age/Gender [name] [name] 73 y.o. female Room/Bed: 035C/035C  Code Status   Code Status: DNR  Home/SNF/Other Rehab Patient oriented to: self, place, time and situation Is this baseline? Yes   Triage Complete: Triage complete  Chief Complaint Hemoglobin 6  Triage Note Patient reports being sent by doctor for hemoglobin of 6.4 - blood work done this morning at [employer]. Patient denies any bloody or dark colored stools, no dyspnea at rest, no abd pain. Resp e/u, skin w/d. No blood thinner.   Allergies Allergies  Allergen Reactions  . Beta Adrenergic Blockers Anaphylaxis and Other (See Comments)    "DEATH" (Bradycardia and her organs shut down)  . Cephalexin Shortness Of Breath  . Codeine Anaphylaxis and Swelling    Throat swelling  . Contrast Media [Iodinated Diagnostic Agents] Other (See Comments)    Patient states "I have chronic kidney disease, so the doctor said no dye in my veins."  . Coreg [Carvedilol] Other (See Comments)    Beta Blockers cause her organs to shut down (per patient)  . Peppermint Flavor Shortness Of Breath  . Prednisone Anaphylaxis, Shortness Of Breath and Swelling    Throat swells   . Tape Other (See Comments)    States plastic tape blisters her skin  . Ciprofloxacin Hives    Tolerating levofloxin   . Latex Itching and Rash  . Penicillins Hives    DID THE REACTION INVOLVE: Swelling of the face/tongue/throat, SOB, or low BP? Yes Sudden or severe rash/hives, skin peeling, or the inside of the mouth or nose? Yes Did it require medical treatment? Pt was in hospital at the time of reaction When did it last happen?Was in her mid-40's If all above answers are "NO", may proceed with cephalosporin use.  . Diamox [Acetazolamide] Rash    Rash after 2 doses of diamox     Level of Care/Admitting Diagnosis ED Disposition    ED Disposition Condition [facility] Area: [place] [100100]  Level of Care: Telemetry Cardiac [103]  I expect the patient will be discharged within 24 hours: Yes  LOW acuity---Tx typically complete <24 hrs---ACUTE conditions typically can be evaluated <24 hours---LABS likely to return to acceptable levels <24 hours---IS near functional baseline---EXPECTED to return to current living arrangement---NOT newly hypoxic: Does not meet criteria for 5C-Observation unit  Diagnosis: Chronic blood loss anemia [179579]  Admitting Physician: Lady Deutscher [[id number]]  Attending Physician: Lady Deutscher [[id number]]  PT Class (Do Not Modify): Observation [104]  PT Acc Code (Do Not Modify): Observation [10022]       B Medical/Surgery History Past Medical History:  Diagnosis Date  . Anemia   . Atrial fibrillation ([place])   . Cataract   . Cervical cancer ([place]) 1991   s/p hysterectomy  . Chronic cellulitis   . Chronic combined systolic and diastolic congestive heart failure, NYHA class 2 (H[facility])    LVEF 25-30% with restrictive diastolic filling  . Degenerative joint disease   . Diverticulitis   . Essential hypertension   . GERD (gastroesophageal reflux disease)   . Gout   . Kidney stones   . Morbid obesity ([place])   . NICM (nonischemic cardiomyopathy) ([place]) 02/22/2017  . Osteoarthritis   . Permanent atrial fibrillation 05/04/2009   Qualifier: History of  By: Quentin Cornwall CMA, Janett Billow    . 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 Lovena Le  . Sleep apnea    CPAP   Past Surgical History:  Procedure Laterality Date  . ABDOMINAL HYSTERECTOMY  1991  . BIOPSY  09/26/2018   Procedure: BIOPSY;  Surgeon: Lavena Bullion, DO;  Location: [place];  Service: Gastroenterology;;  . CHEST TUBE INSERTION Right 11/11/2018   Procedure: INSERTION PLEURAL DRAINAGE CATHETER;  Surgeon:  Ivin Poot, MD;  Location: [place];  Service: Thoracic;  Laterality: Right;  . COLONOSCOPY  1998   one polyp per patient  . ESOPHAGOGASTRODUODENOSCOPY (EGD) WITH PROPOFOL N/A 09/26/2018   Procedure: ESOPHAGOGASTRODUODENOSCOPY (EGD) WITH PROPOFOL;  Surgeon: Lavena Bullion, DO;  Location: [place];  Service: Gastroenterology;  Laterality: N/A;  . EYE SURGERY    . GIVENS CAPSULE STUDY N/A 10/09/2018   Procedure: GIVENS CAPSULE STUDY;  Surgeon: Carol Ada, MD;  Location: [place];  Service: Endoscopy;  Laterality: N/A;  . IR THORACENTESIS ASP PLEURAL SPACE W/IMG GUIDE  10/09/2018  . PACEMAKER INSERTION  Nov 2000   St Jude with revision in 2011  . PERCUTANEOUS NEPHROLITHOTOMY  April 2012  . RIGHT HEART CATH N/A 06/28/2018   Procedure: RIGHT HEART CATH;  Surgeon: Jolaine Artist, MD;  Location: [place] [facility];  Service: Cardiovascular;  Laterality: N/A;  . TEE WITHOUT CARDIOVERSION N/A 06/28/2018   Procedure: TRANSESOPHAGEAL ECHOCARDIOGRAM (TEE);  Surgeon: Jolaine Artist, MD;  Location[facility] ENDOSCOPY;  Service: Cardiovascular;  Laterality: N/A;     A IV Location/Drains/Wounds Patient Lines/Drains/Airways Status   Active Line/Drains/Airways    Name:   Placement date:   Placement time:   Site:   Days:   Peripheral IV 12/05/18 Right Forearm   12/05/18    1553    Forearm   less than 1   Closed System Drain Lateral;Right RUQ Other (Comment)   11/11/18    1541    RUQ   24   Incision (Closed) 11/11/18 Chest Right   11/11/18    0907     24   Wound / Incision (Open or Dehisced) 09/25/18 Other (Comment) Thigh Left;Posterior;Mid skin chafing    09/25/18    1900    Thigh   71          Intake/Output Last 24 hours No intake or output data in the 24 hours ending 12/05/18 1735  Labs/Imaging Results for orders placed or performed during the hospital encounter of 12/05/18 (from the past 48 hour(s))  Type and screen [place]     Status: None   Collection  Time: 12/05/18  1:00 PM  Result Value Ref Range   ABO/RH(D) O POS    Antibody Screen POS    Sample Expiration      12/08/2018 Performed at [facility], [place] [address], [place], [place] [address]   Comprehensive metabolic panel     Status: Abnormal   Collection Time: 12/05/18  1:04 PM  Result Value Ref Range   Sodium 133 (L) 135 - 145 mmol/L   Potassium 3.2 (L) 3.5 - 5.1 mmol/L   Chloride 94 (L) 98 - 111 mmol/L   CO2 30 22 - 32 mmol/L   Glucose, Bld 108 (H) 70 - 99 mg/dL   BUN 102 (H) 8 - 23 mg/dL   Creatinine, Ser 3.20 (H) 0.44 - 1.00 mg/dL   Calcium 8.8 (L) 8.9 -  10.3 mg/dL   Total Protein 5.6 (L) 6.5 - 8.1 g/dL   Albumin 3.0 (L) 3.5 - 5.0 g/dL   AST 22 15 - 41 U/L   ALT 16 0 - 44 U/L   Alkaline Phosphatase 59 38 - 126 U/L   Total Bilirubin 0.6 0.3 - 1.2 mg/dL   GFR calc non Af Amer 14 (L) >60 mL/min   GFR calc Af Amer 16 (L) >60 mL/min   Anion gap 9 5 - 15    Comment: Performed at [facility] [address], [place], [place] [address]  CBC     Status: Abnormal   Collection Time: 12/05/18  1:04 PM  Result Value Ref Range   WBC 5.0 4.0 - 10.5 K/uL   RBC 2.36 (L) 3.87 - 5.11 MIL/uL   Hemoglobin 6.9 (LL) 12.0 - 15.0 g/dL    Comment: REPEATED TO VERIFY THIS CRITICAL RESULT HAS VERIFIED AND BEEN CALLED TO K COBB,RN BY DENNIS BRADLEY ON 03 12 2020 AT 1333, AND HAS BEEN READ BACK. READ BACK AND VERIFIED    HCT 23.0 (L) 36.0 - 46.0 %   MCV 97.5 80.0 - 100.0 fL   MCH 29.2 26.0 - 34.0 pg   MCHC 30.0 30.0 - 36.0 g/dL   RDW 18.6 (H) 11.5 - 15.5 %   Platelets 153 150 - 400 K/uL   nRBC 0.0 0.0 - 0.2 %    Comment: Performed at [facility] [address] [place], [place] [address]  I-stat troponin, ED     Status: None   Collection Time: 12/05/18  2:28 PM  Result Value Ref Range   Troponin i, poc 0.03 0.00 - 0.08 ng/mL   Comment 3            Comment: Due to the release kinetics of cTnI, a negative result within the first hours of the onset of symptoms does  not rule out myocardial infarction with certainty. If myocardial infarction is still suspected, repeat the test at appropriate intervals.   Prepare RBC     Status: None   Collection Time: 12/05/18  2:39 PM  Result Value Ref Range   Order Confirmation      ORDER PROCESSED BY BLOOD BANK Performed at [facility], [place] [address] [place], [place] [address]    No results found.  Pending Labs Unresulted Labs (From admission, onward)    Start     Ordered   12/06/18 0500  CBC  Tomorrow morning,   R     12/05/18 1615          Vitals/Pain Today's Vitals   12/05/18 1557 12/05/18 1600 12/05/18 1630 12/05/18 1700  BP: (!) 102/53 (!) 110/54 112/68 95/63  Pulse: 97 (!) 103 98 94  Resp: 16 17 17 20   Temp:      TempSrc:      SpO2: 96% 98% 97% 96%  PainSc:        Isolation Precautions No active isolations  Medications Medications  0.9 %  sodium chloride infusion (has no administration in time range)  potassium chloride SA (K-DUR,KLOR-CON) CR tablet 40 mEq (has no administration in time range)  sodium chloride flush (NS) 0.9 % injection 3 mL (has no administration in time range)  sodium chloride flush (NS) 0.9 % injection 3 mL (has no administration in time range)  0.9 %  sodium chloride infusion (has no administration in time range)  sodium chloride 0.9 % bolus 250 mL (0  mLs Intravenous Stopped 12/05/18 1622)    Mobility manual wheelchair High fall risk   Focused Assessments Gastrointestinal   R Recommendations: See Admitting Provider Note  Report given to:   Additional Notes: Recurrent low hgb, pt recently admitted for the same, unable to find bleed on endoscopy or capsule study, needs colonoscopy but cardiologist will not clear her for procedure. Pt has many antibodies in her blood so blood bank is bringing units of blood from [place], pt will receive blood sometime after 7pm.

## 2018-12-05 NOTE — ED Triage Notes (Signed)
Patient reports being sent by doctor for hemoglobin of 6.4 - blood work done this morning at [employer]. Patient denies any bloody or dark colored stools, no dyspnea at rest, no abd pain. Resp e/u, skin w/d. No blood thinner.

## 2018-12-05 NOTE — ED Provider Notes (Signed)
[facility] EMERGENCY DEPARTMENT Provider Note   CSN: [id number] Arrival date & time: 12/05/18  1223    History   Chief Complaint Chief Complaint  Patient presents with   Abnormal Lab    HPI [name] [name] is a 73 y.o. female.     [name] [name] is a 73 y.o. female with a history of A. fib, CHF, pacemaker placement, cirrhosis, CKD, anemia, who presents to the emergency department for evaluation of low hemoglobin.  She reports that she was called by her doctor today and told that her hemoglobin was 6.4.  Patient had recent admission from 2/11 to 2/24 for symptomatic anemia, initial hemoglobin on arrival was 5.2 she received multiple transfusions.  They felt that likely source of bleeding was slow lower GI bleeding.  Had normal EGD, with heme positive stools, but GI felt that patient was too high risk for sedation and colonoscopy given her respiratory and cardiac status.  At discharge hemoglobin of 8.8.  Patient denies any dark stools or bright red blood per rectum.  No hematemesis.  No hematuria.  She reports some mild weakness and fatigue.  Reports she has intermittent chest pains as she had for the past 7 years these are no worse than usual, currently chest pain-free.  Reports some occasional shortness of breath that is worse with exertion.  Has chronic lower extremity edema that is at baseline although daughter notes some blisters over the extremities from fluid overload.  She has been residing at a rehab facility since discharge from hospital and has been improving significantly, had plans for patient to be discharged home on Monday 3/16, but upon turn of patient's hemoglobin today they sent her to the emergency department for further evaluation and transfusion.  A history of multiple antibodies and in the past has had to get blood sent from [place] or [place].     Past Medical History:  Diagnosis Date   Anemia    Atrial fibrillation ([place])    Cataract     Cervical cancer ([place]) 1991   s/p hysterectomy   Chronic cellulitis    Chronic combined systolic and diastolic congestive heart failure, NYHA class 2 (HCC)    LVEF 25-30% with restrictive diastolic filling   Degenerative joint disease    Diverticulitis    Essential hypertension    GERD (gastroesophageal reflux disease)    Gout    Kidney stones    Morbid obesity (HCC)    NICM (nonischemic cardiomyopathy) ([employer]) 02/22/2017   Osteoarthritis    Permanent atrial fibrillation 05/04/2009   Qualifier: History of  By: Quentin Cornwall CMA, Jessica     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 Lovena Le   Sleep apnea    CPAP    Patient Active Problem List   Diagnosis Date Noted   Heme positive stool    Acute on chronic systolic CHF (congestive heart failure) ([place])    Severe anemia 11/06/2018   Goals of care, counseling/discussion    Palliative care by specialist    DNR (do not resuscitate) discussion    Hypertensive cardiovascular-renal disease, stage 1-4 or unspecified chronic kidney disease, with heart failure ([place]) 10/06/2018   Hypokalemia 10/06/2018   Hyponatremia 10/06/2018   Thrombocytopenia ([place]) 10/06/2018   Acute on chronic combined systolic and diastolic congestive heart failure, NYHA class 4 ([place]) 10/06/2018  Acute on chronic systolic and diastolic heart failure, NYHA class 4 ([place]) 10/06/2018   Gastrointestinal hemorrhage 10/03/2018   Gastritis and gastroduodenitis    AKI (acute kidney injury) ([place])    Symptomatic anemia    Acute renal failure superimposed on stage 4 chronic kidney disease ([place])    Syncope 06/21/2018   Iron deficiency 06/21/2018   GERD (gastroesophageal reflux disease) 10/04/2017   Acute on chronic combined systolic and diastolic CHF (congestive heart failure) ([place]) 04/02/2017   NICM (nonischemic  cardiomyopathy) ([place]) 02/22/2017   CKD stage G3b/A1, GFR 30-44 and albumin creatinine ratio <30 mg/g (HCC)    Dysphagia, idiopathic 02/15/2017   Pleural effusion on right 01/30/2017   Osteoporosis 08/10/2016   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   Chest pain 10/30/2014   CKD (chronic kidney disease), stage IV ([place]) 03/07/2013   Sinoatrial node dysfunction (HCC)    Chronic combined systolic and diastolic CHF (congestive heart failure) ([place]) 05/07/2011   Morbid obesity ([place]) 05/07/2011   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   OSA (obstructive sleep apnea) 05/05/2009   Essential hypertension 05/04/2009   Permanent atrial fibrillation 05/04/2009   ALLERGIC RHINITIS 05/04/2009    Past Surgical History:  Procedure Laterality Date   ABDOMINAL HYSTERECTOMY  1991   BIOPSY  09/26/2018   Procedure: BIOPSY;  Surgeon: Lavena Bullion, DO;  Location: [facility];  Service: Gastroenterology;;   CHEST TUBE INSERTION Right 11/11/2018   Procedure: INSERTION PLEURAL DRAINAGE CATHETER;  Surgeon: Ivin Poot, MD;  Location: [facility];  Service: Thoracic;  Laterality: Right;   COLONOSCOPY  1998   one polyp per patient   ESOPHAGOGASTRODUODENOSCOPY (EGD) WITH PROPOFOL N/A 09/26/2018   Procedure: ESOPHAGOGASTRODUODENOSCOPY (EGD) WITH PROPOFOL;  Surgeon: Lavena Bullion, DO;  Location: [facility];  Service: Gastroenterology;  Laterality: N/A;   EYE SURGERY     GIVENS CAPSULE STUDY N/A 10/09/2018   Procedure: GIVENS CAPSULE STUDY;  Surgeon: Carol Ada, MD;  Location: [facility];  Service: Endoscopy;  Laterality: N/A;   IR THORACENTESIS ASP PLEURAL SPACE W/IMG GUIDE  10/09/2018   PACEMAKER INSERTION  Nov 2000   St Jude with revision in 2011   PERCUTANEOUS NEPHROLITHOTOMY  April 2012   RIGHT HEART CATH N/A 06/28/2018   Procedure: RIGHT HEART CATH;  Surgeon: Jolaine Artist, MD;  Location: [facility];  Service: Cardiovascular;  Laterality:  N/A;   TEE WITHOUT CARDIOVERSION N/A 06/28/2018   Procedure: TRANSESOPHAGEAL ECHOCARDIOGRAM (TEE);  Surgeon: Jolaine Artist, MD;  Location: [facility] ENDOSCOPY;  Service: Cardiovascular;  Laterality: N/A;     OB History   No obstetric history on file.      Home Medications    Prior to Admission medications   Medication Sig Start Date End Date Taking? Authorizing Provider  allopurinol (ZYLOPRIM) 300 MG tablet Take 300 mg by mouth every evening.     [provider]  aspirin EC 325 MG tablet Take 325 mg by mouth at bedtime.    [provider]  calcitRIOL (ROCALTROL) 0.25 MCG capsule Take 0.25 mcg by mouth every other day.     [provider]  clotrimazole-betamethasone (LOTRISONE) cream Apply 1 application topically 2 (two) times daily as needed (for irritation- affected sites).  07/26/18   [provider]  COD LIVER OIL PO Take 1 capsule by mouth daily.     [provider]  docusate sodium (COLACE) 100 MG capsule Take 100 mg by mouth daily as needed for mild constipation.  [provider]  feeding supplement (BOOST HIGH PROTEIN) LIQD Take 1 Container by mouth 2 (two) times daily.     [provider]  ferrous sulfate 325 (65 FE) MG tablet Take 325 mg by mouth at bedtime.     [provider]  HYDROcodone-acetaminophen (NORCO) 10-325 MG tablet Take 1 tablet by mouth See admin instructions. Take 1 tablet by mouth two times a day and an additional 0.5 tablet two times a day as needed for pain 11/15/18   Caren Griffins, MD  isosorbide mononitrate (IMDUR) 30 MG 24 hr tablet Take 0.5 tablets (15 mg total) by mouth daily. Patient taking differently: Take 15 mg by mouth at bedtime.  12/10/17   Fay Records, MD  methocarbamol (ROBAXIN) 500 MG tablet Take 1 tablet (500 mg total) by mouth every 6 (six) hours as needed for muscle spasms. 10/18/18   Shelly Coss, MD  metolazone (ZAROXOLYN) 2.5 MG tablet Take 2.5 mg by mouth every  Monday, Wednesday, and Friday.     [provider]  Multiple Vitamin (MULTIVITAMIN WITH MINERALS) TABS tablet Take 1 tablet by mouth at bedtime.    [provider]  pantoprazole (PROTONIX) 40 MG tablet Take 1 tablet (40 mg total) by mouth 2 (two) times daily. 10/02/18   Mahala Menghini, PA-C  polyethylene glycol (MIRALAX / GLYCOLAX) packet Take 17 g by mouth at bedtime.    [provider]  potassium chloride SA (K-DUR,KLOR-CON) 20 MEQ tablet Start on 10/24/18 10/18/18   Shelly Coss, MD  torsemide (DEMADEX) 20 MG tablet Take 4 tablets (80 mg total) by mouth 2 (two) times daily. May also take 1 tablet (20 mg total) as needed (for weight greater than 246). Patient taking differently: Take 80 mg by mouth two times a day and an additional 20 mg as needed for a weight gain >246 07/15/18   Clegg, Amy D, NP  vitamin B-12 (CYANOCOBALAMIN) 500 MCG tablet Take 500 mcg by mouth daily.    [provider]    Family History Family History  Problem Relation Age of Onset   Heart attack Father    Stroke Mother    Diabetes Sister    Colon cancer Other        seven family members   Liver disease Neg Hx    Other Neg Hx     Social History Social History   Tobacco Use   Smoking status: Former Smoker    Packs/day: 1.00    Types: Cigarettes    Last attempt to quit: 09/26/1979    Years since quitting: 39.2   Smokeless tobacco: Never Used  Substance Use Topics   Alcohol use: No   Drug use: No     Allergies   Beta adrenergic blockers; Cephalexin; Codeine; Contrast media [iodinated diagnostic agents]; Coreg [carvedilol]; Peppermint flavor; Prednisone; Tape; Ciprofloxacin; Latex; Penicillins; and Diamox [acetazolamide]   Review of Systems Review of Systems  Constitutional: Positive for fatigue. Negative for chills and fever.  HENT: Negative.   Eyes: Negative for visual disturbance.  Respiratory: Negative for cough and shortness of breath.     Cardiovascular: Positive for leg swelling (Chronic). Negative for chest pain.  Gastrointestinal: Negative for abdominal pain, nausea and vomiting.  Musculoskeletal: Negative for arthralgias and myalgias.  Skin: Negative for color change and rash.  Neurological: Negative for dizziness, syncope and light-headedness.  All other systems reviewed and are negative.    Physical Exam Updated Vital Signs BP (!) 105/53 (BP Location: Right Arm)  Pulse 90    Temp 98.1 F (36.7 C) (Oral)    Resp 18    SpO2 100%   Physical Exam Vitals signs and nursing note reviewed.  Constitutional:      General: She is not in acute distress.    Appearance: She is well-developed. She is obese. She is not diaphoretic.     Comments: Chronically ill-appearing but in no acute distress  HENT:     Head: Normocephalic and atraumatic.  Eyes:     General:        Right eye: No discharge.        Left eye: No discharge.     Pupils: Pupils are equal, round, and reactive to light.  Neck:     Musculoskeletal: Neck supple.  Cardiovascular:     Rate and Rhythm: Normal rate and regular rhythm.     Pulses: Normal pulses.     Heart sounds: Normal heart sounds. No murmur. No friction rub. No gallop.   Pulmonary:     Effort: Pulmonary effort is normal. No respiratory distress.     Breath sounds: Normal breath sounds. No wheezing or rales.     Comments: Respirations equal and unlabored, patient able to speak in full sentences, lungs clear to auscultation bilaterally Abdominal:     General: Bowel sounds are normal. There is no distension.     Palpations: Abdomen is soft. There is no mass.     Tenderness: There is no abdominal tenderness. There is no guarding.     Comments: Abdomen soft, nondistended, nontender to palpation in all quadrants without guarding or peritoneal signs  Musculoskeletal:        General: No deformity.     Right lower leg: Edema present.     Left lower leg: Edema present.     Comments: Chronic edema  bilateral lower extremities noted with overlying skin changes and some fluid-filled bullae noted  Skin:    General: Skin is warm and dry.     Capillary Refill: Capillary refill takes less than 2 seconds.     Coloration: Skin is pale.  Neurological:     Mental Status: She is alert.     Coordination: Coordination normal.     Comments: Speech is clear, able to follow commands Moves extremities without ataxia, coordination intact  Psychiatric:        Mood and Affect: Mood normal.        Behavior: Behavior normal.      ED Treatments / Results  Labs (all labs ordered are listed, but only abnormal results are displayed) Labs Reviewed  COMPREHENSIVE METABOLIC PANEL - Abnormal; Notable for the following components:      Result Value   Sodium 133 (*)    Potassium 3.2 (*)    Chloride 94 (*)    Glucose, Bld 108 (*)    BUN 102 (*)    Creatinine, Ser 3.20 (*)    Calcium 8.8 (*)    Total Protein 5.6 (*)    Albumin 3.0 (*)    GFR calc non Af Amer 14 (*)    GFR calc Af Amer 16 (*)    All other components within normal limits  CBC - Abnormal; Notable for the following components:   RBC 2.36 (*)    Hemoglobin 6.9 (*)    HCT 23.0 (*)    RDW 18.6 (*)    All other components within normal limits  I-STAT TROPONIN, ED  POC OCCULT BLOOD, ED  TYPE AND SCREEN  PREPARE RBC (CROSSMATCH)    EKG EKG Interpretation  Date/Time:  Thursday December 05 2018 14:12:30 EDT Ventricular Rate:  90 PR Interval:    QRS Duration: 180 QT Interval:  456 QTC Calculation: 558 R Axis:   -93 Text Interpretation:  Sinus rhythm vs paced Multiform ventricular premature complexes Prolonged PR interval Right bundle branch bloc Probable RV involvement, suggest recording right precordial leads similar to prior 2/20 Confirmed by Aletta Edouard [phone]) on 12/05/2018 2:23:11 PM   Radiology No results found.  Procedures .Critical Care Performed by: Jacqlyn Larsen, PA-C Authorized by: Jacqlyn Larsen, PA-C    Critical care provider statement:    Critical care time (minutes):  45   Critical care was necessary to treat or prevent imminent or life-threatening deterioration of the following conditions:  Circulatory failure (Symptomatic anemia requiring transfusion)   Critical care was time spent personally by me on the following activities:  Discussions with consultants, evaluation of patient's response to treatment, examination of patient, ordering and performing treatments and interventions, ordering and review of laboratory studies, ordering and review of radiographic studies, pulse oximetry, re-evaluation of patient's condition, obtaining history from patient or surrogate and review of old charts   (including critical care time)  Medications Ordered in ED Medications  0.9 %  sodium chloride infusion (has no administration in time range)  potassium chloride SA (K-DUR,KLOR-CON) CR tablet 40 mEq (has no administration in time range)  sodium chloride flush (NS) 0.9 % injection 3 mL (has no administration in time range)  sodium chloride flush (NS) 0.9 % injection 3 mL (has no administration in time range)  0.9 %  sodium chloride infusion (has no administration in time range)  sodium chloride 0.9 % bolus 250 mL (0 mLs Intravenous Stopped 12/05/18 1622)     Initial Impression / Assessment and Plan / ED Course  I have reviewed the triage vital signs and the nursing notes.  Pertinent labs & imaging results that were available during my care of the patient were reviewed by me and considered in my medical decision making (see chart for details).  Patient presents for evaluation of low hemoglobin, was told by her primary care doctor that her hemoglobin was 6.4 today.  On arrival hemoglobin 6.9.  Recent admission for symptomatic anemia and felt to have slow lower GI bleeding but not a candidate for colonoscopy.  He denies bright red blood or dark stools.  Has some chronic fatigue and weakness but no acute  symptoms with this blood loss is not having any chest pain or shortness of breath currently.  Patient has multiple blood antibodies and has been a difficult crossmatch in the past, her blood typically has to come from [place] or sometimes further.  On arrival vitals normal, blood pressure of 105/53 which per patient is typically her baseline.  She did have one soft blood pressure of 86/52, blood has been ordered for transfusion which will likely improve this she is not having any infectious symptoms to suggest sepsis.  I suspect this is due to hypovolemia, will give 250 mL fluid bolus, patient has history of severe heart failure, so I would like to be very cautious with fluids.  Hemoglobin of 6.9 but labs otherwise at baseline, no leukocytosis.  Creatinine of 3.2 which is slightly decreased from usual, mild hypokalemia of 3.2, p.o. potassium replacement given.  Normal liver function.  EKG similar to previous, negative troponin.  Type and screen collected and pending.  Will hold off on further occult  blood testing as patient has not had any new or large-volume rectal bleeding and has known slow GI bleeding with multiple positive Hemoccults in the past.  Case discussed with Dr. Evangeline Gula with [facility] hospitalist who will see and admit the patient for transfusion, working on crossmatch with blood from [place].  Final Clinical Impressions(s) / ED Diagnoses   Final diagnoses:  Symptomatic anemia    ED Discharge Orders    None       Jacqlyn Larsen, [place] 12/05/18 1701    Hayden Rasmussen, MD 12/06/18 1556

## 2018-12-06 DIAGNOSIS — N184 Chronic kidney disease, stage 4 (severe): Secondary | ICD-10-CM | POA: Diagnosis not present

## 2018-12-06 DIAGNOSIS — K922 Gastrointestinal hemorrhage, unspecified: Secondary | ICD-10-CM | POA: Diagnosis not present

## 2018-12-06 DIAGNOSIS — N170 Acute kidney failure with tubular necrosis: Secondary | ICD-10-CM | POA: Diagnosis not present

## 2018-12-06 DIAGNOSIS — D500 Iron deficiency anemia secondary to blood loss (chronic): Secondary | ICD-10-CM | POA: Diagnosis not present

## 2018-12-06 LAB — CBC
HCT: 23 % — ABNORMAL LOW (ref 36.0–46.0)
Hemoglobin: 7.1 g/dL — ABNORMAL LOW (ref 12.0–15.0)
MCH: 29.6 pg (ref 26.0–34.0)
MCHC: 30.9 g/dL (ref 30.0–36.0)
MCV: 95.8 fL (ref 80.0–100.0)
Platelets: 146 10*3/uL — ABNORMAL LOW (ref 150–400)
RBC: 2.4 MIL/uL — ABNORMAL LOW (ref 3.87–5.11)
RDW: 18.9 % — ABNORMAL HIGH (ref 11.5–15.5)
WBC: 4.5 10*3/uL (ref 4.0–10.5)
nRBC: 0 % (ref 0.0–0.2)

## 2018-12-06 LAB — HEMOGLOBIN AND HEMATOCRIT, BLOOD
HCT: 26 % — ABNORMAL LOW (ref 36.0–46.0)
Hemoglobin: 7.9 g/dL — ABNORMAL LOW (ref 12.0–15.0)

## 2018-12-06 LAB — GLUCOSE, CAPILLARY: Glucose-Capillary: 126 mg/dL — ABNORMAL HIGH (ref 70–99)

## 2018-12-06 LAB — OCCULT BLOOD X 1 CARD TO LAB, STOOL: FECAL OCCULT BLD: POSITIVE — AB

## 2018-12-06 LAB — PREPARE RBC (CROSSMATCH)

## 2018-12-06 MED ORDER — ALLOPURINOL 300 MG PO TABS
300.0000 mg | ORAL_TABLET | Freq: Every evening | ORAL | Status: DC
  Administered 2018-12-06 – 2018-12-10 (×5): 300 mg via ORAL
  Filled 2018-12-06 (×5): qty 1

## 2018-12-06 MED ORDER — CLOTRIMAZOLE 1 % EX CREA
TOPICAL_CREAM | Freq: Two times a day (BID) | CUTANEOUS | Status: DC | PRN
  Filled 2018-12-06: qty 15

## 2018-12-06 MED ORDER — VITAMIN D 25 MCG (1000 UNIT) PO TABS
1000.0000 [IU] | ORAL_TABLET | Freq: Every day | ORAL | Status: DC
  Administered 2018-12-06 – 2018-12-11 (×6): 1000 [IU] via ORAL
  Filled 2018-12-06 (×6): qty 1

## 2018-12-06 MED ORDER — SODIUM CHLORIDE 0.9% IV SOLUTION
Freq: Once | INTRAVENOUS | Status: AC
  Administered 2018-12-06: 11:00:00 via INTRAVENOUS

## 2018-12-06 MED ORDER — PANTOPRAZOLE SODIUM 40 MG PO TBEC
40.0000 mg | DELAYED_RELEASE_TABLET | Freq: Two times a day (BID) | ORAL | Status: DC
  Administered 2018-12-06 – 2018-12-11 (×11): 40 mg via ORAL
  Filled 2018-12-06 (×11): qty 1

## 2018-12-06 MED ORDER — ENSURE ENLIVE PO LIQD
237.0000 mL | Freq: Two times a day (BID) | ORAL | Status: DC
  Administered 2018-12-06: 237 mL via ORAL

## 2018-12-06 MED ORDER — POLYETHYLENE GLYCOL 3350 17 G PO PACK
17.0000 g | PACK | Freq: Every day | ORAL | Status: DC
  Administered 2018-12-06 – 2018-12-10 (×3): 17 g via ORAL
  Filled 2018-12-06 (×4): qty 1

## 2018-12-06 MED ORDER — TORSEMIDE 20 MG PO TABS
80.0000 mg | ORAL_TABLET | Freq: Two times a day (BID) | ORAL | Status: DC
  Administered 2018-12-06 – 2018-12-11 (×11): 80 mg via ORAL
  Filled 2018-12-06 (×11): qty 4

## 2018-12-06 MED ORDER — POTASSIUM CHLORIDE CRYS ER 20 MEQ PO TBCR
20.0000 meq | EXTENDED_RELEASE_TABLET | ORAL | Status: DC
  Administered 2018-12-06: 20 meq via ORAL
  Filled 2018-12-06 (×2): qty 1

## 2018-12-06 MED ORDER — FERROUS SULFATE 325 (65 FE) MG PO TABS
325.0000 mg | ORAL_TABLET | Freq: Every day | ORAL | Status: DC
  Administered 2018-12-06 – 2018-12-10 (×5): 325 mg via ORAL
  Filled 2018-12-06 (×5): qty 1

## 2018-12-06 MED ORDER — DOCUSATE SODIUM 100 MG PO CAPS: 100.0000 mg | ORAL_CAPSULE | Freq: Every day | ORAL | Status: DC | PRN

## 2018-12-06 MED ORDER — SODIUM CHLORIDE 0.9% IV SOLUTION
Freq: Once | INTRAVENOUS | Status: AC
  Administered 2018-12-10: 18:00:00 via INTRAVENOUS

## 2018-12-06 MED ORDER — CALCITRIOL 0.25 MCG PO CAPS
0.2500 ug | ORAL_CAPSULE | ORAL | Status: DC
  Administered 2018-12-06 – 2018-12-10 (×3): 0.25 ug via ORAL
  Filled 2018-12-06 (×5): qty 1

## 2018-12-06 MED ORDER — MISC. DEVICES MISC: 1.0000 | Freq: Every day | Status: DC

## 2018-12-06 MED ORDER — METOLAZONE 2.5 MG PO TABS
2.5000 mg | ORAL_TABLET | ORAL | Status: DC
  Administered 2018-12-06 – 2018-12-11 (×3): 2.5 mg via ORAL
  Filled 2018-12-06 (×4): qty 1

## 2018-12-06 MED ORDER — ADULT MULTIVITAMIN W/MINERALS CH
1.0000 | ORAL_TABLET | Freq: Every day | ORAL | Status: DC
  Administered 2018-12-06 – 2018-12-10 (×5): 1 via ORAL
  Filled 2018-12-06 (×5): qty 1

## 2018-12-06 MED ORDER — ISOSORBIDE MONONITRATE ER 30 MG PO TB24
15.0000 mg | ORAL_TABLET | Freq: Every day | ORAL | Status: DC
  Administered 2018-12-06 – 2018-12-11 (×6): 15 mg via ORAL
  Filled 2018-12-06 (×6): qty 1

## 2018-12-06 NOTE — Progress Notes (Signed)
Patient refusing bed alarm

## 2018-12-06 NOTE — NC FL2 (Signed)
[place] LEVEL OF CARE SCREENING TOOL     IDENTIFICATION  Patient Name: [name] [name] Birthdate: 06/19/1946 Sex: female Admission Date (Current Location): 12/05/2018  [place] and [place] Number:  ([place], [place].)   Facility and Address:  [facility], [place] [address], [place], [place] [address]      Provider Number: [id number]  Attending Physician Name and Address:  Monica Becton, MD  Relative Name and Phone Number:       Current Level of Care: Hospital Recommended Level of Care: [place] Prior Approval Number:    Date Approved/Denied:   PASRR Number:    Discharge Plan: SNF    Current Diagnoses: Patient Active Problem List   Diagnosis Date Noted  . Chronic blood loss anemia 12/05/2018  . Heme positive stool   . Acute on chronic systolic CHF (congestive heart failure) ([place])   . Severe anemia 11/06/2018  . Goals of care, counseling/discussion   . Palliative care by specialist   . DNR (do not resuscitate) discussion   . Hypertensive cardiovascular-renal disease, stage 1-4 or unspecified chronic kidney disease, with heart failure ([place]) 10/06/2018  . Hypokalemia 10/06/2018  . Hyponatremia 10/06/2018  . Thrombocytopenia ([place]) 10/06/2018  . Acute on chronic combined systolic and diastolic congestive heart failure, NYHA class 4 ([place]) 10/06/2018  . Acute on chronic systolic and diastolic heart failure, NYHA class 4 ([place]) 10/06/2018  . Gastrointestinal hemorrhage 10/03/2018  . Gastritis and gastroduodenitis   . AKI (acute kidney injury) ([place])   . Symptomatic anemia   . Acute renal failure superimposed on stage 4 chronic kidney disease ([place])   . Syncope 06/21/2018  . Iron deficiency 06/21/2018  . GERD (gastroesophageal reflux disease) 10/04/2017  . Acute on chronic combined systolic and diastolic CHF (congestive heart failure) ([place]) 04/02/2017  . NICM (nonischemic cardiomyopathy) ([place]) 02/22/2017   . CKD stage G3b/A1, GFR 30-44 and albumin creatinine ratio <30 mg/g (HCC)   . Dysphagia, idiopathic 02/15/2017  . Pleural effusion on right 01/30/2017  . Osteoporosis 08/10/2016  . Liver cirrhosis secondary to NASH ([place]) 06/13/2016  . Chest pain 10/30/2014  . CKD (chronic kidney disease), stage IV ([place]) 03/07/2013  . Sinoatrial node dysfunction (HCC)   . Chronic combined systolic and diastolic CHF (congestive heart failure) ([place]) 05/07/2011  . Morbid obesity ([place]) 05/07/2011  . 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 OSA (obstructive sleep apnea) 05/05/2009  . Essential hypertension 05/04/2009  . Permanent atrial fibrillation 05/04/2009  . ALLERGIC RHINITIS 05/04/2009    Orientation RESPIRATION BLADDER Height & Weight     Self, Time, Situation, Place  Normal, Other (Comment)(Home cpap at night.) Continent Weight: 248 lb 3.2 oz (112.6 kg)(last weight was put in wrong ) Height:  5' 6"  (167.6 cm)  BEHAVIORAL SYMPTOMS/MOOD NEUROLOGICAL BOWEL NUTRITION STATUS  (None) (None) Continent Diet(Heart healthy. Fluid restriction 1500 mL.)  AMBULATORY STATUS COMMUNICATION OF NEEDS Skin   Limited Assist Verbally Bruising, Surgical wounds, Other (Comment)(Blister, Catheter entry/exit, Cellulitis, MASD, Skin tear. Chafing on left posterior mid thigh: No dressing.)                       Personal Care Assistance Level of Assistance  Bathing, Feeding, Dressing Bathing Assistance: Limited assistance Feeding assistance: Independent Dressing Assistance: Limited assistance     Functional Limitations Info  Sight, Hearing, Speech Sight Info: Adequate Hearing Info: Adequate Speech Info: Adequate    SPECIAL CARE FACTORS FREQUENCY  PT (By licensed  PT), OT (By licensed OT)     PT Frequency: 5 x week OT Frequency: 5 x week            Contractures Contractures Info: Not present    Additional Factors Info  Code Status, Allergies Code Status Info: DNR Allergies Info: Beta  Adrenergic Blockers, Cephalexin, Codeine, Contrast Media (Iodinated Diagnostic Agents), Coreg (Carvedilol), Peppermint Flavor, Prednisone, Tape, Ciprofloxacin, Latex, Penicillins, Iron, Diamox Acetazolamide           Current Medications (12/06/2018):  This is the current hospital active medication list Current Facility-Administered Medications  Medication Dose Route Frequency Provider Last Rate Last Dose  . 0.9 %  sodium chloride infusion (Manually program via Guardrails IV Fluids)   Intravenous Once Monica Becton, MD      . 0.9 %  sodium chloride infusion  10 mL/hr Intravenous Once Lady Deutscher, MD      . 0.9 %  sodium chloride infusion  250 mL Intravenous PRN Lady Deutscher, MD      . allopurinol (ZYLOPRIM) tablet 300 mg  300 mg Oral QPM Vasireddy, Grier Mitts, MD      . calcitRIOL (ROCALTROL) capsule 0.25 mcg  0.25 mcg Oral Ricky Ala, MD   0.25 mcg at 12/06/18 1113  . cholecalciferol (VITAMIN D3) tablet 1,000 Units  1,000 Units Oral Daily Monica Becton, MD   1,000 Units at 12/06/18 1113  . clotrimazole (LOTRIMIN) 1 % cream   Topical BID PRN Monica Becton, MD      . docusate sodium (COLACE) capsule 100 mg  100 mg Oral Daily PRN Vasireddy, Grier Mitts, MD      . feeding supplement (ENSURE ENLIVE) (ENSURE ENLIVE) liquid 237 mL  237 mL Oral BID Vasireddy, Grier Mitts, MD      . ferrous sulfate tablet 325 mg  325 mg Oral QHS Vasireddy, Grier Mitts, MD      . HYDROcodone-acetaminophen (NORCO) 10-325 MG per tablet 1 tablet  1 tablet Oral Q6H PRN Bodenheimer, Charles A, NP      . isosorbide mononitrate (IMDUR) 24 hr tablet 15 mg  15 mg Oral Daily Monica Becton, MD   15 mg at 12/06/18 1113  . metolazone (ZAROXOLYN) tablet 2.5 mg  2.5 mg Oral Q M,W,F Vasireddy, Grier Mitts, MD   2.5 mg at 12/06/18 1113  . multivitamin with minerals tablet 1 tablet  1 tablet Oral QHS Vasireddy, Grier Mitts, MD      . pantoprazole (PROTONIX) EC tablet 40 mg  40 mg Oral BID Monica Becton, MD    40 mg at 12/06/18 1116  . polyethylene glycol (MIRALAX / GLYCOLAX) packet 17 g  17 g Oral QHS Vasireddy, [place], MD      . potassium chloride SA (K-DUR,KLOR-CON) CR tablet 20 mEq  20 mEq Oral Once per day on Mon Wed Fri Monica Becton, MD   20 mEq at 12/06/18 1113  . sodium chloride flush (NS) 0.9 % injection 3 mL  3 mL Intravenous Q12H Lady Deutscher, MD   3 mL at 12/06/18 1112  . sodium chloride flush (NS) 0.9 % injection 3 mL  3 mL Intravenous PRN Lady Deutscher, MD      . torsemide [facility]) tablet 80 mg  80 mg Oral BID Monica Becton, MD   80 mg at 12/06/18 1116     Discharge Medications: Please see discharge summary for a list of discharge medications.  Relevant Imaging Results:  Relevant Lab Results:   Additional Information SS#: [id number]  Candie Chroman, LCSW

## 2018-12-06 NOTE — Care Management Obs Status (Signed)
[facility] NOTIFICATION   Patient Details  Name: [name] [name] MRN: [id number] Date of Birth: [birth year]   Medicare Observation Status Notification Given:  Yes    Candie Chroman, LCSW 12/06/2018, 3:47 PM

## 2018-12-06 NOTE — Progress Notes (Signed)
PROGRESS NOTE    JULIANNAH OHMANN  SKA:[id number] DOB: July 01, 1946 DOA: 12/05/2018 PCP: [name], MD   Brief Narrative:  [name] [name] is a 73 y.o. female with medical history significant systolic congestive heart failure, atrial fibrillation, cirrhosis, tonic kidney disease stage IV, GI bleeding recently admitted to our facility February 11 through February 24 of this year for acute anemia.  She was found to have a slow GI bleed thought to be coming from her colon.  Upper endoscopy, capsule endoscopy was done and there was no ulcerations noted.  Patient required 5 units of packed RBC. Patient felt to be too unstable to proceed with colonoscopy at that time.  He was transfused and upon discharge had a hemoglobin of 8.8.  Went to rehab and today was found to have a hemoglobin of 6.6 and referred to [place] for further evaluation and management.    Patient states has been experiencing generalized weakness.  Did not see any blood in the stool.  Patient received 1 unit of packed RBC hemoglobin improved to 7.1.  Patient with multiple antibodies and blood needs to come from [place].  Assessment & Plan:   Active Problems:   Chronic blood loss anemia  ##Acute on chronic blood loss anemia -Stool occult is positive -As per GI, cardiology evaluation, currently patient is not a candidate for colonoscopy -Transfuse another unit of packed RBC -Patient had allergic reaction to IV iron during the previous admission -Continue with the p.o. iron -Recommended patient to follow-up as an outpatient gastroenterology  ##Chronic GI bleed -Most likely from colon -Patient has multiple risk factors including cirrhosis of the liver, congestive heart failure -Patient is also at risk for AVMs -Hold aspirin  ##Chronic systolic congestive heart failure -Patient had ejection fraction of 30 to 35% with the severe MR, moderate TR with pulmonary arterial pressures. -Continue with the torsemide  ##Severe mitral  regurgitation -Patient is not a candidate for surgery  #Chronic right pleural effusion -S/p right Pleurx catheter placed,  ##Severe debility -Patient states has not walked in the last 5 years, wheelchair-bound  ##Morbid obesity -Patient has BMI of 40  ##Chronic persistent atrial fibrillation -S/p AV nodal ablation and pacemaker placement -Patient is not a candidate for anticoagulation considering patient's chronic blood loss  ##Obstructive sleep apnea -Continue with the CPAP at nighttime  ##Liver cirrhosis -Patient has platelets of 146, preserved synthetic function  ##Coronary artery disease -Continue with Imdur -Holding aspirin considering patient's GI bleed     DVT prophylaxis: SCDs Code Status: DNR Family Communication: Daughter over the phone, patient   disposition Plan: (SNF   Procedures: Transfusion 2 units of packed RBC   Subjective: Wants to be discharged to skilled nursing facility  Objective: Vitals:   12/06/18 0037 12/06/18 0329 12/06/18 1139 12/06/18 1215  BP: (!) 98/49 (!) 113/58 (!) 101/51 (!) 95/53  Pulse: 92 98 97 98  Resp:  20 18 18   Temp:  98.5 F (36.9 C) 98.2 F (36.8 C) 98.6 F (37 C)  TempSrc:  Oral Oral Oral  SpO2:  98% 94% 97%  Weight:  112.6 kg    Height:        Intake/Output Summary (Last 24 hours) at 12/06/2018 1420 Last data filed at 12/06/2018 1400 Gross per 24 hour  Intake 775 ml  Output 1726 ml  Net -951 ml   Filed Weights   12/05/18 1849 12/06/18 0329  Weight: 68.4 kg 112.6 kg    Examination:  General exam: Appears calm and comfortable  Respiratory system: Clear to auscultation. Respiratory effort normal. Cardiovascular system: S1 & S2 heard, RRR. No JVD, murmurs, rubs, gallops or clicks. No pedal edema. Gastrointestinal system: Abdomen is nondistended, soft and nontender. No organomegaly or masses felt. Normal bowel sounds heard. Central nervous system: Alert and oriented. No focal neurological deficits.  Extremities: Symmetric 5 x 5 power. Skin: No rashes, lesions or ulcers Psychiatry: Judgement and insight appear normal. Mood & affect appropriate.     Data Reviewed: I have personally reviewed following labs and imaging studies  CBC: Recent Labs  Lab 12/05/18 1304 12/06/18 0545  WBC 5.0 4.5  HGB 6.9* 7.1*  HCT 23.0* 23.0*  MCV 97.5 95.8  PLT 153 729*   Basic Metabolic Panel: Recent Labs  Lab 12/05/18 1304  NA 133*  K 3.2*  CL 94*  CO2 30  GLUCOSE 108*  BUN 102*  CREATININE 3.20*  CALCIUM 8.8*   GFR: Estimated Creatinine Clearance: 19.9 mL/min (A) (by C-G formula based on SCr of 3.2 mg/dL (H)). Liver Function Tests: Recent Labs  Lab 12/05/18 1304  AST 22  ALT 16  ALKPHOS 59  BILITOT 0.6  PROT 5.6*  ALBUMIN 3.0*   No results for input(s): LIPASE, AMYLASE in the last 168 hours. No results for input(s): AMMONIA in the last 168 hours. Coagulation Profile: No results for input(s): INR, PROTIME in the last 168 hours. Cardiac Enzymes: No results for input(s): CKTOTAL, CKMB, CKMBINDEX, TROPONINI in the last 168 hours. BNP (last 3 results) No results for input(s): PROBNP in the last 8760 hours. HbA1C: No results for input(s): HGBA1C in the last 72 hours. CBG: Recent Labs  Lab 12/06/18 0540  GLUCAP 126*   Lipid Profile: No results for input(s): CHOL, HDL, LDLCALC, TRIG, CHOLHDL, LDLDIRECT in the last 72 hours. Thyroid Function Tests: No results for input(s): TSH, T4TOTAL, FREET4, T3FREE, THYROIDAB in the last 72 hours. Anemia Panel: No results for input(s): VITAMINB12, FOLATE, FERRITIN, TIBC, IRON, RETICCTPCT in the last 72 hours. Sepsis Labs: No results for input(s): PROCALCITON, LATICACIDVEN in the last 168 hours.  Recent Results (from the past 240 hour(s))  MRSA PCR Screening     Status: None   Collection Time: 12/05/18  8:23 PM  Result Value Ref Range Status   MRSA by PCR NEGATIVE NEGATIVE Final    Comment:        The GeneXpert MRSA Assay (FDA  approved for NASAL specimens only), is one component of a comprehensive MRSA colonization surveillance program. It is not intended to diagnose MRSA infection nor to guide or monitor treatment for MRSA infections. Performed at [facility], [place] [address] [place], [place] [address]          Radiology Studies: No results found.      Scheduled Meds: . sodium chloride   Intravenous Once  . allopurinol  300 mg Oral QPM  . calcitRIOL  0.25 mcg Oral QODAY  . cholecalciferol  1,000 Units Oral Daily  . feeding supplement (ENSURE ENLIVE)  237 mL Oral BID  . ferrous sulfate  325 mg Oral QHS  . isosorbide mononitrate  15 mg Oral Daily  . metolazone  2.5 mg Oral Q M,W,F  . multivitamin with minerals  1 tablet Oral QHS  . pantoprazole  40 mg Oral BID  . polyethylene glycol  17 g Oral QHS  . potassium chloride SA  20 mEq Oral Once per day on Mon Wed Fri  . sodium chloride flush  3 mL Intravenous Q12H  .  torsemide  80 mg Oral BID   Continuous Infusions: . sodium chloride    . sodium chloride       LOS: 0 days    Time spent: 72mn   Codee Tutson, MD [facility] Pager [phone]  If 7PM-7AM, please contact night-coverage [email or web address] Password [email or web address] 12/06/2018, 2:20 PM

## 2018-12-06 NOTE — Progress Notes (Signed)
MD, pt was asking for her med she takes at SNF/home: Imdur 69m qHS, Torsemide 80 mg 2x day, Protonix 41m2xday; etc. her med list is in the chart, again pt was concerned about this, Thanks [facility] [place]

## 2018-12-06 NOTE — Progress Notes (Signed)
Pt placed self on CPAP for the night on her home setting. Pt brought in her own equipment to use while here in the hospital. RT will continue to monitor.

## 2018-12-06 NOTE — TOC Initial Note (Addendum)
Transition of Care [facility]) - Initial/Assessment Note    Patient Details  Name: [name] [name] MRN: [id number] Date of Birth: 1946/07/31  Transition of Care [facility]) CM/SW Contact:    Candie Chroman, LCSW Phone Number: 12/06/2018, 3:51 PM  Clinical Narrative:    CSW met with patient. No supports at bedside. CSW introduced role and explained that discharge planning would be discussed. Patient confirmed she is a short-term rehab resident from [place] and plans to return at discharge. She is upset because she was told this morning that she would return today but per MD she will now discharge tomorrow. SNF admissions coordinator aware. Patient stated before coming into the hospital she was supposed to discharge home on Monday. No further concerns. CSW encouraged patient to contact CSW as needed. CSW will continue to follow patient for support and facilitate discharge back to SNF tomorrow.           Expected Discharge Plan: Skilled Nursing Facility Barriers to Discharge: Continued Medical Work up   Patient Goals and CMS Choice        Expected Discharge Plan and Services Expected Discharge Plan: [place]                                Prior Living Arrangements/Services   Lives with:: Adult Children Patient language and need for interpreter reviewed:: No Do you feel safe going back to the place where you live?: Yes      Need for Family Participation in Patient Care: Yes (Comment)     Criminal Activity/Legal Involvement Pertinent to Current Situation/Hospitalization: No - Comment as needed  Activities of Daily Living      Permission Sought/Granted Permission sought to share information with : Chartered certified accountant granted to share information with : Yes, Verbal Permission Granted     Permission granted to share info w AGENCY: [facility]        Emotional Assessment Appearance:: Appears stated age Attitude/Demeanor/Rapport:  Other (comment)(Upset) Affect (typically observed): Irritable, Appropriate Orientation: : Oriented to Self, Oriented to Place, Oriented to  Time, Oriented to Situation Alcohol / Substance Use: Never Used Psych Involvement: No (comment)  Admission diagnosis:  Symptomatic anemia [D64.9] Patient Active Problem List   Diagnosis Date Noted  . Chronic blood loss anemia 12/05/2018  . Heme positive stool   . Acute on chronic systolic CHF (congestive heart failure) ([place])   . Severe anemia 11/06/2018  . Goals of care, counseling/discussion   . Palliative care by specialist   . DNR (do not resuscitate) discussion   . Hypertensive cardiovascular-renal disease, stage 1-4 or unspecified chronic kidney disease, with heart failure ([place]) 10/06/2018  . Hypokalemia 10/06/2018  . Hyponatremia 10/06/2018  . Thrombocytopenia ([place]) 10/06/2018  . Acute on chronic combined systolic and diastolic congestive heart failure, NYHA class 4 ([place]) 10/06/2018  . Acute on chronic systolic and diastolic heart failure, NYHA class 4 ([place]) 10/06/2018  . Gastrointestinal hemorrhage 10/03/2018  . Gastritis and gastroduodenitis   . AKI (acute kidney injury) ([place])   . Symptomatic anemia   . Acute renal failure superimposed on stage 4 chronic kidney disease ([place])   . Syncope 06/21/2018  . Iron deficiency 06/21/2018  . GERD (gastroesophageal reflux disease) 10/04/2017  . Acute on chronic combined systolic and diastolic CHF (congestive heart failure) ([place]) 04/02/2017  . NICM (nonischemic cardiomyopathy) ([place]) 02/22/2017  . CKD stage G3b/A1, GFR 30-44 and albumin creatinine  ratio <30 mg/g ([place])   . Dysphagia, idiopathic 02/15/2017  . Pleural effusion on right 01/30/2017  . Osteoporosis 08/10/2016  . Liver cirrhosis secondary to NASH ([place]) 06/13/2016  . Chest pain 10/30/2014  . CKD (chronic kidney disease), stage IV ([place]) 03/07/2013  . Sinoatrial node dysfunction ([facility])   . Chronic combined systolic and diastolic CHF  (congestive heart failure) ([place]) 05/07/2011  . Morbid obesity ([place]) 05/07/2011  . 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 OSA (obstructive sleep apnea) 05/05/2009  . Essential hypertension 05/04/2009  . Permanent atrial fibrillation 05/04/2009  . ALLERGIC RHINITIS 05/04/2009   PCP:  [name], MD Pharmacy:   [facility], [place] [address] Phone: [phone] Fax: [phone]     Social Determinants of Health (SDOH) Interventions    Readmission Risk Interventions 30 Day Unplanned Readmission Risk Score     ED to Hosp-Admission (Discharged) from 11/05/2018 in [facility]  30 Day Unplanned Readmission Risk Score (%)  38 Filed at 11/18/2018 1200     This score is the patient's risk of an unplanned readmission within 30 days of being discharged (0 -100%). The score is based on dignosis, age, lab data, medications, orders, and past utilization.   Low:  0-14.9   Medium: 15-21.9   High: 22-29.9   Extreme: 30 and above       No flowsheet data found.

## 2018-12-07 DIAGNOSIS — Z8541 Personal history of malignant neoplasm of cervix uteri: Secondary | ICD-10-CM | POA: Diagnosis not present

## 2018-12-07 DIAGNOSIS — I130 Hypertensive heart and chronic kidney disease with heart failure and stage 1 through stage 4 chronic kidney disease, or unspecified chronic kidney disease: Secondary | ICD-10-CM | POA: Diagnosis not present

## 2018-12-07 DIAGNOSIS — N179 Acute kidney failure, unspecified: Secondary | ICD-10-CM | POA: Diagnosis not present

## 2018-12-07 DIAGNOSIS — G4733 Obstructive sleep apnea (adult) (pediatric): Secondary | ICD-10-CM | POA: Diagnosis present

## 2018-12-07 DIAGNOSIS — X58XXXA Exposure to other specified factors, initial encounter: Secondary | ICD-10-CM | POA: Diagnosis present

## 2018-12-07 DIAGNOSIS — D631 Anemia in chronic kidney disease: Secondary | ICD-10-CM | POA: Diagnosis not present

## 2018-12-07 DIAGNOSIS — R5381 Other malaise: Secondary | ICD-10-CM | POA: Diagnosis present

## 2018-12-07 DIAGNOSIS — I428 Other cardiomyopathies: Secondary | ICD-10-CM | POA: Diagnosis present

## 2018-12-07 DIAGNOSIS — D649 Anemia, unspecified: Secondary | ICD-10-CM | POA: Diagnosis not present

## 2018-12-07 DIAGNOSIS — Z993 Dependence on wheelchair: Secondary | ICD-10-CM | POA: Diagnosis not present

## 2018-12-07 DIAGNOSIS — Z87442 Personal history of urinary calculi: Secondary | ICD-10-CM | POA: Diagnosis not present

## 2018-12-07 DIAGNOSIS — Z6841 Body Mass Index (BMI) 40.0 and over, adult: Secondary | ICD-10-CM | POA: Diagnosis not present

## 2018-12-07 DIAGNOSIS — I4821 Permanent atrial fibrillation: Secondary | ICD-10-CM | POA: Diagnosis present

## 2018-12-07 DIAGNOSIS — M109 Gout, unspecified: Secondary | ICD-10-CM | POA: Diagnosis present

## 2018-12-07 DIAGNOSIS — N184 Chronic kidney disease, stage 4 (severe): Secondary | ICD-10-CM | POA: Diagnosis not present

## 2018-12-07 DIAGNOSIS — M1990 Unspecified osteoarthritis, unspecified site: Secondary | ICD-10-CM | POA: Diagnosis present

## 2018-12-07 DIAGNOSIS — I340 Nonrheumatic mitral (valve) insufficiency: Secondary | ICD-10-CM | POA: Diagnosis present

## 2018-12-07 DIAGNOSIS — I5042 Chronic combined systolic (congestive) and diastolic (congestive) heart failure: Secondary | ICD-10-CM | POA: Diagnosis present

## 2018-12-07 DIAGNOSIS — K219 Gastro-esophageal reflux disease without esophagitis: Secondary | ICD-10-CM | POA: Diagnosis present

## 2018-12-07 DIAGNOSIS — I5022 Chronic systolic (congestive) heart failure: Secondary | ICD-10-CM | POA: Diagnosis not present

## 2018-12-07 DIAGNOSIS — E611 Iron deficiency: Secondary | ICD-10-CM | POA: Diagnosis not present

## 2018-12-07 DIAGNOSIS — I2510 Atherosclerotic heart disease of native coronary artery without angina pectoris: Secondary | ICD-10-CM | POA: Diagnosis present

## 2018-12-07 DIAGNOSIS — N189 Chronic kidney disease, unspecified: Secondary | ICD-10-CM | POA: Diagnosis not present

## 2018-12-07 DIAGNOSIS — K922 Gastrointestinal hemorrhage, unspecified: Secondary | ICD-10-CM | POA: Diagnosis present

## 2018-12-07 DIAGNOSIS — Z950 Presence of cardiac pacemaker: Secondary | ICD-10-CM | POA: Diagnosis not present

## 2018-12-07 DIAGNOSIS — D62 Acute posthemorrhagic anemia: Secondary | ICD-10-CM | POA: Diagnosis present

## 2018-12-07 DIAGNOSIS — D500 Iron deficiency anemia secondary to blood loss (chronic): Secondary | ICD-10-CM | POA: Diagnosis not present

## 2018-12-07 DIAGNOSIS — K7460 Unspecified cirrhosis of liver: Secondary | ICD-10-CM | POA: Diagnosis present

## 2018-12-07 DIAGNOSIS — I10 Essential (primary) hypertension: Secondary | ICD-10-CM | POA: Diagnosis not present

## 2018-12-07 DIAGNOSIS — Z90710 Acquired absence of both cervix and uterus: Secondary | ICD-10-CM | POA: Diagnosis not present

## 2018-12-07 DIAGNOSIS — Z66 Do not resuscitate: Secondary | ICD-10-CM | POA: Diagnosis present

## 2018-12-07 DIAGNOSIS — I509 Heart failure, unspecified: Secondary | ICD-10-CM | POA: Diagnosis not present

## 2018-12-07 DIAGNOSIS — N2581 Secondary hyperparathyroidism of renal origin: Secondary | ICD-10-CM | POA: Diagnosis present

## 2018-12-07 LAB — GLUCOSE, CAPILLARY: Glucose-Capillary: 99 mg/dL (ref 70–99)

## 2018-12-07 LAB — BASIC METABOLIC PANEL
Anion gap: 11 (ref 5–15)
BUN: 102 mg/dL — ABNORMAL HIGH (ref 8–23)
CALCIUM: 8.5 mg/dL — AB (ref 8.9–10.3)
CO2: 30 mmol/L (ref 22–32)
Chloride: 93 mmol/L — ABNORMAL LOW (ref 98–111)
Creatinine, Ser: 2.95 mg/dL — ABNORMAL HIGH (ref 0.44–1.00)
GFR calc Af Amer: 18 mL/min — ABNORMAL LOW (ref >60)
GFR calc non Af Amer: 15 mL/min — ABNORMAL LOW (ref >60)
Glucose, Bld: 157 mg/dL — ABNORMAL HIGH (ref 70–99)
Potassium: 2.4 mmol/L — CL (ref 3.5–5.1)
Sodium: 134 mmol/L — ABNORMAL LOW (ref 135–145)

## 2018-12-07 LAB — CBC
HCT: 24.9 % — ABNORMAL LOW (ref 36.0–46.0)
Hemoglobin: 7.6 g/dL — ABNORMAL LOW (ref 12.0–15.0)
MCH: 29.1 pg (ref 26.0–34.0)
MCHC: 30.5 g/dL (ref 30.0–36.0)
MCV: 95.4 fL (ref 80.0–100.0)
Platelets: 139 10*3/uL — ABNORMAL LOW (ref 150–400)
RBC: 2.61 MIL/uL — ABNORMAL LOW (ref 3.87–5.11)
RDW: 18.5 % — ABNORMAL HIGH (ref 11.5–15.5)
WBC: 4.4 10*3/uL (ref 4.0–10.5)
nRBC: 0 % (ref 0.0–0.2)

## 2018-12-07 LAB — MAGNESIUM: Magnesium: 2.5 mg/dL — ABNORMAL HIGH (ref 1.7–2.4)

## 2018-12-07 MED ORDER — POTASSIUM CHLORIDE CRYS ER 20 MEQ PO TBCR
40.0000 meq | EXTENDED_RELEASE_TABLET | ORAL | Status: AC
  Administered 2018-12-07 (×4): 40 meq via ORAL
  Filled 2018-12-07 (×4): qty 2

## 2018-12-07 MED ORDER — POTASSIUM CHLORIDE CRYS ER 20 MEQ PO TBCR: 40.0000 meq | EXTENDED_RELEASE_TABLET | ORAL | Status: DC

## 2018-12-07 NOTE — Progress Notes (Signed)
Patient said she is having another blister on her lower extremity (right lower mid), it was assessed and a foam is put on it. She also said she was not going to take two potassium pills because she only takes one tablet at home. I had to explain to her why it is important for her to take two tablets of potassium as her potassium level is 2.4. She agreed to take it now. Will continue to monitor.

## 2018-12-07 NOTE — Progress Notes (Signed)
PROGRESS NOTE    [name] [name]  GBT:[id number] DOB: 1946/09/25 DOA: 12/05/2018 PCP: [name], MD   Brief Narrative:  [name] [name] is a 73 y.o. female with medical history significant systolic congestive heart failure, atrial fibrillation, cirrhosis, tonic kidney disease stage IV, GI bleeding recently admitted to our facility February 11 through February 24 of this year for acute anemia.  She was found to have a slow GI bleed thought to be coming from her colon.  Upper endoscopy, capsule endoscopy was done and there was no ulcerations noted.  Patient required 5 units of packed RBC. Patient felt to be too unstable to proceed with colonoscopy at that time.  He was transfused and upon discharge had a hemoglobin of 8.8.  Went to rehab and today was found to have a hemoglobin of 6.6 and referred to [place] for further evaluation and management.    Patient states has been experiencing generalized weakness.  Did not see any blood in the stool.  Patient received 1 unit of packed RBC hemoglobin improved to 7.1.  Patient with multiple antibodies and blood needs to come from [place].  Assessment & Plan:   Active Problems:   Chronic blood loss anemia   GI bleed  ##Acute on chronic blood loss anemia -Stool occult is positive -As per GI, cardiology evaluation, currently patient is not a candidate for colonoscopy until respiratory condition improves -Patient had allergic reaction to IV iron during the previous admission -Continue with the p.o. iron -Patient had 2 units of packed RBC improved from 6.6-7.6.  Patient continues to have trend down of the hemoglobin. -Consult gastroenterology concerning about patient's multiple admissions for significant anemia requiring multiple transfusions.  ##Chronic GI bleed -Patient had EGD, capsule endoscopy -Most likely from colon -Patient has multiple risk factors including cirrhosis of the liver, congestive heart failure,  also at risk for AVMs -Hold  aspirin -Consult gastroenterology -Patient also has elevated BUN of 102, concern about upper GI bleed  ##Hypokalemia -Patient's potassium trended down to 2.4 -Replace by mouth 40 mEq every 4 hours x 4 doses -Normal magnesium level of 2.5 -Recheck BMP in the morning  ##Chronic systolic congestive heart failure -Patient had ejection fraction of 30 to 35% with the severe MR, moderate TR with pulmonary arterial pressures. -Continue with the torsemide  ##Acute on chronic renal insufficiency -Patient has a stage IV kidney disease -Patient's BUN significantly worsened compared to previous admission -Worsening of the kidney function versus upper GI bleed -Consulted nephrology  ##Severe mitral regurgitation -Patient is not a candidate for surgery  #Chronic right pleural effusion -S/p right Pleurx catheter placed, removes fluid every other day  ##Severe debility -Patient states has not walked in the last 5 years, wheelchair-bound  ##Morbid obesity -Patient has BMI of 40  ##Chronic persistent atrial fibrillation -S/p AV nodal ablation and pacemaker placement -Patient is not a candidate for anticoagulation considering patient's chronic blood loss  ##Obstructive sleep apnea -Continue with the CPAP at nighttime  ##Liver cirrhosis -Patient has platelets of 146, preserved synthetic function  ##Coronary artery disease -Continue with Imdur -Holding aspirin considering patient's GI bleed     DVT prophylaxis: SCDs Code Status: DNR Family Communication:  patient  disposition Plan: (SNF   Procedures: Transfusion 2 units of packed RBC  Consults Nephrology Gastroenterology   Subjective: Wants to be discharged to skilled nursing facility  Objective: Vitals:   12/07/18 0040 12/07/18 0216 12/07/18 0531 12/07/18 1237  BP: 111/60  (!) 92/46 125/63  Pulse: 100  92  95  Resp:   18 20  Temp:   98.2 F (36.8 C) 98 F (36.7 C)  TempSrc:   Oral   SpO2: 92%  97% 96%  Weight:   112.2 kg    Height:        Intake/Output Summary (Last 24 hours) at 12/07/2018 1311 Last data filed at 12/07/2018 1053 Gross per 24 hour  Intake 860 ml  Output 2900 ml  Net -2040 ml   Filed Weights   12/05/18 1849 12/06/18 0329 12/07/18 0216  Weight: 68.4 kg 112.6 kg 112.2 kg    Examination:  General exam: Appears calm and comfortable  Respiratory system: Clear to auscultation. Respiratory effort normal. Cardiovascular system: S1 & S2 heard, RRR. No JVD, murmurs, rubs, gallops or clicks. No pedal edema. Gastrointestinal system: Abdomen is nondistended, soft and nontender. No organomegaly or masses felt. Normal bowel sounds heard. Central nervous system: Alert and oriented. No focal neurological deficits. Extremities: Symmetric 5 x 5 power. Skin: No rashes, lesions or ulcers Psychiatry: Judgement and insight appear normal. Mood & affect appropriate.     Data Reviewed: I have personally reviewed following labs and imaging studies  CBC: Recent Labs  Lab 12/05/18 1304 12/06/18 0545 12/06/18 1541 12/07/18 0853  WBC 5.0 4.5  --  4.4  HGB 6.9* 7.1* 7.9* 7.6*  HCT 23.0* 23.0* 26.0* 24.9*  MCV 97.5 95.8  --  95.4  PLT 153 146*  --  482*   Basic Metabolic Panel: Recent Labs  Lab 12/05/18 1304 12/07/18 0853  NA 133* 134*  K 3.2* 2.4*  CL 94* 93*  CO2 30 30  GLUCOSE 108* 157*  BUN 102* 102*  CREATININE 3.20* 2.95*  CALCIUM 8.8* 8.5*  MG  --  2.5*   GFR: Estimated Creatinine Clearance: 21.6 mL/min (A) (by C-G formula based on SCr of 2.95 mg/dL (H)). Liver Function Tests: Recent Labs  Lab 12/05/18 1304  AST 22  ALT 16  ALKPHOS 59  BILITOT 0.6  PROT 5.6*  ALBUMIN 3.0*   No results for input(s): LIPASE, AMYLASE in the last 168 hours. No results for input(s): AMMONIA in the last 168 hours. Coagulation Profile: No results for input(s): INR, PROTIME in the last 168 hours. Cardiac Enzymes: No results for input(s): CKTOTAL, CKMB, CKMBINDEX, TROPONINI in the last  168 hours. BNP (last 3 results) No results for input(s): PROBNP in the last 8760 hours. HbA1C: No results for input(s): HGBA1C in the last 72 hours. CBG: Recent Labs  Lab 12/06/18 0540 12/07/18 0749  GLUCAP 126* 99   Lipid Profile: No results for input(s): CHOL, HDL, LDLCALC, TRIG, CHOLHDL, LDLDIRECT in the last 72 hours. Thyroid Function Tests: No results for input(s): TSH, T4TOTAL, FREET4, T3FREE, THYROIDAB in the last 72 hours. Anemia Panel: No results for input(s): VITAMINB12, FOLATE, FERRITIN, TIBC, IRON, RETICCTPCT in the last 72 hours. Sepsis Labs: No results for input(s): PROCALCITON, LATICACIDVEN in the last 168 hours.  Recent Results (from the past 240 hour(s))  MRSA PCR Screening     Status: None   Collection Time: 12/05/18  8:23 PM  Result Value Ref Range Status   MRSA by PCR NEGATIVE NEGATIVE Final    Comment:        The GeneXpert MRSA Assay (FDA approved for NASAL specimens only), is one component of a comprehensive MRSA colonization surveillance program. It is not intended to diagnose MRSA infection nor to guide or monitor treatment for MRSA infections. Performed at [facility], [place] [address],  [place], [place] [address]          Radiology Studies: No results found.      Scheduled Meds: . sodium chloride   Intravenous Once  . allopurinol  300 mg Oral QPM  . calcitRIOL  0.25 mcg Oral QODAY  . cholecalciferol  1,000 Units Oral Daily  . feeding supplement (ENSURE ENLIVE)  237 mL Oral BID  . ferrous sulfate  325 mg Oral QHS  . isosorbide mononitrate  15 mg Oral Daily  . metolazone  2.5 mg Oral Q M,W,F  . multivitamin with minerals  1 tablet Oral QHS  . pantoprazole  40 mg Oral BID  . polyethylene glycol  17 g Oral QHS  . potassium chloride  40 mEq Oral Q4H  . sodium chloride flush  3 mL Intravenous Q12H  . torsemide  80 mg Oral BID   Continuous Infusions: . sodium chloride    . sodium chloride       LOS: 0 days    Time  spent: 31mn   Meldrick Buttery, MD [facility] Pager [phone]  If 7PM-7AM, please contact night-coverage [email or web address] Password [email or web address] 12/07/2018, 1:11 PM

## 2018-12-07 NOTE — Progress Notes (Signed)
Patient is alert and oriented, in bed with no complains. She still is refusing SCD's and bed alarm. Patient is educated. Will continue to monitor.

## 2018-12-08 DIAGNOSIS — I5022 Chronic systolic (congestive) heart failure: Secondary | ICD-10-CM

## 2018-12-08 LAB — BASIC METABOLIC PANEL
Anion gap: 9 (ref 5–15)
BUN: 99 mg/dL — ABNORMAL HIGH (ref 8–23)
CO2: 30 mmol/L (ref 22–32)
Calcium: 8.3 mg/dL — ABNORMAL LOW (ref 8.9–10.3)
Chloride: 96 mmol/L — ABNORMAL LOW (ref 98–111)
Creatinine, Ser: 2.73 mg/dL — ABNORMAL HIGH (ref 0.44–1.00)
GFR calc Af Amer: 19 mL/min — ABNORMAL LOW (ref >60)
GFR calc non Af Amer: 17 mL/min — ABNORMAL LOW (ref >60)
Glucose, Bld: 97 mg/dL (ref 70–99)
Potassium: 3.2 mmol/L — ABNORMAL LOW (ref 3.5–5.1)
Sodium: 135 mmol/L (ref 135–145)

## 2018-12-08 LAB — PREPARE RBC (CROSSMATCH)

## 2018-12-08 LAB — HEMOGLOBIN AND HEMATOCRIT, BLOOD
HCT: 24 % — ABNORMAL LOW (ref 36.0–46.0)
Hemoglobin: 7.4 g/dL — ABNORMAL LOW (ref 12.0–15.0)

## 2018-12-08 MED ORDER — BOOST / RESOURCE BREEZE PO LIQD CUSTOM
1.0000 | Freq: Three times a day (TID) | ORAL | Status: DC
  Administered 2018-12-08 – 2018-12-11 (×2): 1 via ORAL
  Filled 2018-12-08 (×4): qty 1

## 2018-12-08 MED ORDER — HYDROCODONE-ACETAMINOPHEN 10-325 MG PO TABS
1.0000 | ORAL_TABLET | Freq: Four times a day (QID) | ORAL | Status: AC | PRN
  Administered 2018-12-08 (×2): 1 via ORAL
  Filled 2018-12-08 (×2): qty 1

## 2018-12-08 MED ORDER — HYDROCODONE-ACETAMINOPHEN 10-325 MG PO TABS
1.0000 | ORAL_TABLET | Freq: Four times a day (QID) | ORAL | Status: DC | PRN
  Administered 2018-12-08 – 2018-12-11 (×5): 1 via ORAL
  Filled 2018-12-08 (×5): qty 1

## 2018-12-08 MED ORDER — POTASSIUM CHLORIDE CRYS ER 20 MEQ PO TBCR
40.0000 meq | EXTENDED_RELEASE_TABLET | Freq: Once | ORAL | Status: AC
  Administered 2018-12-08: 40 meq via ORAL
  Filled 2018-12-08: qty 2

## 2018-12-08 MED ORDER — METHOCARBAMOL 500 MG PO TABS
500.0000 mg | ORAL_TABLET | Freq: Once | ORAL | Status: AC
  Administered 2018-12-08: 500 mg via ORAL
  Filled 2018-12-08: qty 1

## 2018-12-08 MED ORDER — SODIUM CHLORIDE 0.9% IV SOLUTION
Freq: Once | INTRAVENOUS | Status: AC
  Administered 2018-12-08: 16:00:00 via INTRAVENOUS

## 2018-12-08 MED ORDER — POTASSIUM CHLORIDE CRYS ER 20 MEQ PO TBCR
20.0000 meq | EXTENDED_RELEASE_TABLET | Freq: Once | ORAL | Status: AC
  Administered 2018-12-08: 20 meq via ORAL
  Filled 2018-12-08: qty 1

## 2018-12-08 NOTE — Consult Note (Signed)
Referring Provider: No ref. provider found Primary Care Physician:  [name], MD Primary Nephrologist:  .   Reason for Consultation:  Chronic renal insufficiency stage IV, congestive heart failure ejection fraction 82% systolic dysfunction, hypertension, atrial fibrillation, anemia, increasing lower extremity edema.   HPI: This is a 73 year old lady with a history of significant systolic congestive heart failure with an ejection fraction about 30 to 35%,severe mitral regurgitation, she has had a history of persistent atrial fibrillation status post AV nodal ablation hepatic cirrhosis and chronic kidney disease stage IV.  She is recently admitted to [facility] February 3011 through February 24 with acute GI bleed.  She underwent endoscopic evaluation as well as capsule endoscopy with no ulcerated lesions noted.  She had required 5 units of packed red blood cells.  She was readmitted with anemia.  Hemoglobin on discharge was 8.8.  Recheck hemoglobin 6.6.  She had a persistent right-sided pleural effusion now she has a Pleurx catheter that was placed by Dr. Darcey Nora 11/11/2018.  she was evaluated on June 22, 2018.  Her baseline serum creatinine appears to be about 2.5-3.0 and she has been followed by Dr. Jimmy Footman at [facility].  Blood pressure 96/53 pulse 99 temperature 98.5 O2 sats 94% room air  Urine output 3350 cc 12/07/2018 weight decreased from 112.6 kg 12/06/2018 to 109 kg 12/08/2018  Sodium 135 potassium 3.2 chloride 96 CO2 30 BUN 99 creatinine 2.73 glucose 97 calcium 8.3 WBC 4.4 hemoglobin 7.6 platelets 139.  Isosorbide 15 mg daily, metolazone 2.5 mg Monday Wednesday Friday, torsemide 80 mg twice daily, Calcitrol 0.25 mcg every other day, Protonix 40 mg twice daily, iron sulfate 325 mg nightly  Chest x-ray 12/02/2018 showed a small right pleural effusion with mild pulmonary edema    Past Medical History:  Diagnosis Date  . Anemia   . Atrial fibrillation  ([place])   . Cataract   . Cervical cancer ([place]) 1991   s/p hysterectomy  . Chronic cellulitis   . Chronic combined systolic and diastolic congestive heart failure, NYHA class 2 (HCC)    LVEF 25-30% with restrictive diastolic filling  . Degenerative joint disease   . Diverticulitis   . Essential hypertension   . GERD (gastroesophageal reflux disease)   . Gout   . Kidney stones   . Morbid obesity ([place])   . NICM (nonischemic cardiomyopathy) ([place]) 02/22/2017  . Osteoarthritis   . Permanent atrial fibrillation 05/04/2009   Qualifier: History of  By: Quentin Cornwall CMA, Janett Billow    . 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 Sinoatrial node dysfunction [facility])    Status post PPM - Dr. Lovena Le  . Sleep apnea    CPAP    Past Surgical History:  Procedure Laterality Date  . ABDOMINAL HYSTERECTOMY  1991  . BIOPSY  09/26/2018   Procedure: BIOPSY;  Surgeon: Lavena Bullion, DO;  Location: [place];  Service: Gastroenterology;;  . CHEST TUBE INSERTION Right 11/11/2018   Procedure: INSERTION PLEURAL DRAINAGE CATHETER;  Surgeon: Ivin Poot, MD;  Location: [place];  Service: Thoracic;  Laterality: Right;  . COLONOSCOPY  1998   one polyp per patient  . ESOPHAGOGASTRODUODENOSCOPY (EGD) WITH PROPOFOL N/A 09/26/2018   Procedure: ESOPHAGOGASTRODUODENOSCOPY (EGD) WITH PROPOFOL;  Surgeon: Lavena Bullion, DO;  Location: [place];  Service: Gastroenterology;  Laterality: N/A;  . EYE SURGERY    . GIVENS CAPSULE STUDY N/A 10/09/2018   Procedure:  GIVENS CAPSULE STUDY;  Surgeon: Carol Ada, MD;  Location: [place];  Service: Endoscopy;  Laterality: N/A;  . IR THORACENTESIS ASP PLEURAL SPACE W/IMG GUIDE  10/09/2018  . PACEMAKER INSERTION  Nov 2000   St Jude with revision in 2011  . PERCUTANEOUS NEPHROLITHOTOMY  April 2012  . RIGHT HEART CATH N/A 06/28/2018   Procedure: RIGHT HEART CATH;  Surgeon: Jolaine Artist, MD;   Location: [place] CV LAB;  Service: Cardiovascular;  Laterality: N/A;  . TEE WITHOUT CARDIOVERSION N/A 06/28/2018   Procedure: TRANSESOPHAGEAL ECHOCARDIOGRAM (TEE);  Surgeon: Jolaine Artist, MD;  Location[facility] ENDOSCOPY;  Service: Cardiovascular;  Laterality: N/A;    Prior to Admission medications   Medication Sig Start Date End Date Taking? Authorizing Provider  allopurinol (ZYLOPRIM) 300 MG tablet Take 300 mg by mouth every evening.    Yes [provider]  aspirin EC 325 MG tablet Take 325 mg by mouth at bedtime.   Yes [provider]  calcitRIOL (ROCALTROL) 0.25 MCG capsule Take 0.25 mcg by mouth every other day.    Yes [provider]  cholecalciferol (VITAMIN D3) 25 MCG (1000 UT) tablet Take 1,000 Units by mouth daily.   Yes [provider]  clotrimazole-betamethasone (LOTRISONE) cream Apply 1 application topically 2 (two) times daily as needed (for irritation- affected sites).  07/26/18  Yes [provider]  COD LIVER OIL PO Take 1 capsule by mouth daily.    Yes [provider]  docusate sodium (COLACE) 100 MG capsule Take 100 mg by mouth daily as needed for mild constipation.    Yes [provider]  feeding supplement (BOOST HIGH PROTEIN) LIQD Take 1 Container by mouth 2 (two) times daily.    Yes [provider]  ferrous sulfate 325 (65 FE) MG tablet Take 325 mg by mouth at bedtime.    Yes [provider]  HYDROcodone-acetaminophen (NORCO) 10-325 MG tablet Take 1 tablet by mouth See admin instructions. Take 1 tablet by mouth two times a day and an additional 0.5 tablet two times a day as needed for pain 11/15/18  Yes Gherghe, Vella Redhead, MD  isosorbide mononitrate (IMDUR) 30 MG 24 hr tablet Take 0.5 tablets (15 mg total) by mouth daily. Patient taking differently: Take 15 mg by mouth at bedtime.  12/10/17  Yes Fay Records, MD  methocarbamol (ROBAXIN) 500 MG tablet Take 1 tablet (500 mg total) by mouth every  6 (six) hours as needed for muscle spasms. 10/18/18  Yes Shelly Coss, MD  metolazone (ZAROXOLYN) 2.5 MG tablet Take 2.5 mg by mouth every Monday, Wednesday, and Friday.    Yes [provider]  Misc. Devices MISC 1 each by Does not apply route at bedtime. C-PAP   Yes [provider]  Multiple Vitamin (MULTIVITAMIN WITH MINERALS) TABS tablet Take 1 tablet by mouth at bedtime.   Yes [provider]  pantoprazole (PROTONIX) 40 MG tablet Take 1 tablet (40 mg total) by mouth 2 (two) times daily. 10/02/18  Yes Mahala Menghini, PA-C  polyethylene glycol (MIRALAX / GLYCOLAX) packet Take 17 g by mouth at bedtime.   Yes [provider]  potassium chloride SA (K-DUR,KLOR-CON) 20 MEQ tablet Start on 10/24/18 Patient taking differently: Take 20 mEq by mouth 3 (three) times a week. Taking 66mq twice daily = 436m on Monday, Wednesday, Friday. 10/18/18  Yes AdShelly CossMD  torsemide (DEMADEX) 20 MG tablet Take 4 tablets (80 mg total) by mouth 2 (two) times daily.  May also take 1 tablet (20 mg total) as needed (for weight greater than 246). Patient taking differently: Take 80 mg by mouth two times a day and an additional 20 mg as needed for a weight gain >246 07/15/18  Yes Clegg, Amy D, NP  vitamin B-12 (CYANOCOBALAMIN) 500 MCG tablet Take 500 mcg by mouth daily.   Yes [provider]    Current Facility-Administered Medications  Medication Dose Route Frequency Provider Last Rate Last Dose  . 0.9 %  sodium chloride infusion (Manually program via Guardrails IV Fluids)   Intravenous Once Monica Becton, MD      . 0.9 %  sodium chloride infusion  10 mL/hr Intravenous Once Lady Deutscher, MD      . 0.9 %  sodium chloride infusion  250 mL Intravenous PRN Lady Deutscher, MD      . allopurinol (ZYLOPRIM) tablet 300 mg  300 mg Oral QPM Monica Becton, MD   300 mg at 12/07/18 1713  . calcitRIOL (ROCALTROL) capsule 0.25 mcg  0.25 mcg Oral Ricky Ala, MD   0.25 mcg at 12/08/18 0856  . cholecalciferol (VITAMIN D3) tablet 1,000 Units  1,000 Units Oral Daily Monica Becton, MD   1,000 Units at 12/08/18 0856  . clotrimazole (LOTRIMIN) 1 % cream   Topical BID PRN Monica Becton, MD      . docusate sodium (COLACE) capsule 100 mg  100 mg Oral Daily PRN Vasireddy, Grier Mitts, MD      . feeding supplement (ENSURE ENLIVE) (ENSURE ENLIVE) liquid 237 mL  237 mL Oral BID Monica Becton, MD   237 mL at 12/06/18 2155  . ferrous sulfate tablet 325 mg  325 mg Oral QHS Monica Becton, MD   325 mg at 12/07/18 2300  . isosorbide mononitrate (IMDUR) 24 hr tablet 15 mg  15 mg Oral Daily Monica Becton, MD   15 mg at 12/08/18 0856  . metolazone (ZAROXOLYN) tablet 2.5 mg  2.5 mg Oral Q M,W,F Vasireddy, Grier Mitts, MD   2.5 mg at 12/06/18 1113  . multivitamin with minerals tablet 1 tablet  1 tablet Oral QHS Monica Becton, MD   1 tablet at 12/07/18 2300  . pantoprazole (PROTONIX) EC tablet 40 mg  40 mg Oral BID Monica Becton, MD   40 mg at 12/08/18 0857  . polyethylene glycol (MIRALAX / GLYCOLAX) packet 17 g  17 g Oral QHS Monica Becton, MD   17 g at 12/06/18 2154  . potassium chloride SA (K-DUR,KLOR-CON) CR tablet 40 mEq  40 mEq Oral Once Arrien, Jimmy Picket, MD      . sodium chloride flush (NS) 0.9 % injection 3 mL  3 mL Intravenous Q12H Lady Deutscher, MD   3 mL at 12/08/18 0900  . sodium chloride flush (NS) 0.9 % injection 3 mL  3 mL Intravenous PRN Lady Deutscher, MD      . torsemide [facility]) tablet 80 mg  80 mg Oral BID Monica Becton, MD   80 mg at 12/08/18 0856    Allergies as of 12/05/2018 - Review Complete 12/05/2018  Allergen Reaction Noted  . Beta adrenergic blockers Anaphylaxis and Other (See Comments)   . Cephalexin Shortness Of Breath 06/13/2016  . Codeine Anaphylaxis and Swelling   . Contrast media [iodinated diagnostic agents] Other (See Comments) 10/30/2014  . Coreg [carvedilol] Other (See  Comments) 11/12/2014  . Peppermint flavor Shortness Of Breath 11/21/2016  . Prednisone Anaphylaxis, Shortness Of Breath, and Swelling   . Tape  Other (See Comments) 03/06/2013  . Ciprofloxacin Hives   . Latex Itching and Rash   . Penicillins Hives   . Iron Other (See Comments) 12/05/2018  . Diamox [acetazolamide] Rash 07/15/2018    Family History  Problem Relation Age of Onset  . Heart attack Father   . Stroke Mother   . Diabetes Sister   . Colon cancer Other        seven family members  . Liver disease Neg Hx   . Other Neg Hx     Social History   Socioeconomic History  . Marital status: Widowed    Spouse name: Not on file  . Number of children: 1  . Years of education: Not on file  . Highest education level: Some college, no degree  Occupational History  . Not on file  Social Needs  . Financial resource strain: Not hard at all  . Food insecurity:    Worry: Never true    Inability: Never true  . Transportation needs:    Medical: No    Non-medical: No  Tobacco Use  . Smoking status: Former Smoker    Packs/day: 1.00    Types: Cigarettes    Last attempt to quit: 09/26/1979    Years since quitting: 39.2  . Smokeless tobacco: Never Used  Substance and Sexual Activity  . Alcohol use: No  . Drug use: No  . Sexual activity: Never  Lifestyle  . Physical activity:    Days per week: Not on file    Minutes per session: Not on file  . Stress: Not on file  Relationships  . Social connections:    Talks on phone: Not on file    Gets together: Not on file    Attends religious service: Not on file    Active member of club or organization: Not on file    Attends meetings of clubs or organizations: Not on file    Relationship status: Not on file  . Intimate partner violence:    Fear of current or ex partner: Not on file    Emotionally abused: Not on file    Physically abused: Not on file    Forced sexual activity: Not on file  Other Topics Concern  . Not on file   Social History Narrative  . Not on file    Review of Systems: Gen: No fever sweats chills positive for weakness fatigue and malaise HEENT: No visual complaints, No history of Retinopathy. Normal external appearance No Epistaxis or Sore throat. No sinusitis.   CV: Denies chest pain, she sees Dr. Pernell Dupre for cardiology Resp: She is unable to ambulate she denies any cough wheeze hemoptysis GI: History of GI bleed source is elusive probably AVMs GU : Denies urinary burning, blood in urine, urinary frequency, urinary hesitancy, nocturnal urination, and urinary incontinence.  No renal calculi. MS: Unna boot unable to ambulate secondary lower extremity edema. Derm: Denies rash, itching, dry skin, hives, moles, warts, or unhealing ulcers.  Psych: Denies depression, anxiety, memory loss, suicidal ideation, hallucinations, paranoia, and confusion. Heme: Denies bruising, bleeding, and enlarged lymph nodes. Neuro: No headache.  No diplopia. No dysarthria.  No dysphasia.  No history of CVA.  No Seizures. No paresthesias.  No weakness. Endocrine diabetes mellitus.  No Thyroid disease.  No Adrenal disease.  Physical Exam: Vital signs in last 24 hours: Temp:  [98 F (36.7 C)-98.5 F (36.9 C)] 98.5 F (36.9 C) (03/15 0435) Pulse Rate:  [95-105] 99 (03/15 0435) Resp:  [  18-20] 18 (03/15 0435) BP: (96-125)/(53-63) 96/53 (03/15 0435) SpO2:  [94 %-96 %] 94 % (03/15 0435) Weight:  [109 kg] 109 kg (03/15 0435) Last BM Date: 12/06/18 General:   Obese chronically ill-appearing lady using oxygen through nasal cannula Head:  Normocephalic and atraumatic. Eyes:  Sclera clear, no icterus.   Conjunctiva pink. Ears:  Normal auditory acuity. Nose:  No deformity, discharge,  or lesions. Mouth:  No deformity or lesions, dentition normal. Neck:  Supple; no masses or thyromegaly. JVP mildly elevated Lungs: Pleurx tube in right drain lungs clear Heart:  Holosystolic murmur distant heart sounds poorly defined  S1-S2 Abdomen: Obese soft, nontender and nondistended. No masses, hepatosplenomegaly or hernias noted. Normal bowel sounds, without guarding, and without rebound.   Msk:  Symmetrical without gross deformities. Normal posture. Pulses:  No carotid, renal, femoral bruits. DP and PT symmetrical and equal Extremities: Trace edema to thighs Neurologic:  Alert and  oriented x4;  grossly normal neurologically. Skin:  Intact without significant lesions or rashes. Cervical Nodes:  No significant cervical adenopathy. Psych:  Alert and cooperative. Normal mood and affect.  Intake/Output from previous day: 03/14 0701 - 03/15 0700 In: 725 [P.O.:725] Out: 3450 [Urine:2600; Drains:850] Intake/Output this shift: Total I/O In: 240 [P.O.:240] Out: -   Lab Results: Recent Labs    12/05/18 1304 12/06/18 0545 12/06/18 1541 12/07/18 0853  WBC 5.0 4.5  --  4.4  HGB 6.9* 7.1* 7.9* 7.6*  HCT 23.0* 23.0* 26.0* 24.9*  PLT 153 146*  --  139*   BMET Recent Labs    12/05/18 1304 12/07/18 0853 12/08/18 0732  NA 133* 134* 135  K 3.2* 2.4* 3.2*  CL 94* 93* 96*  CO2 30 30 30   GLUCOSE 108* 157* 97  BUN 102* 102* 99*  CREATININE 3.20* 2.95* 2.73*  CALCIUM 8.8* 8.5* 8.3*   LFT Recent Labs    12/05/18 1304  PROT 5.6*  ALBUMIN 3.0*  AST 22  ALT 16  ALKPHOS 59  BILITOT 0.6   PT/INR No results for input(s): LABPROT, INR in the last 72 hours. Hepatitis Panel No results for input(s): HEPBSAG, HCVAB, HEPAIGM, HEPBIGM in the last 72 hours.  Studies/Results: No results found.  Assessment/Plan:  Chronic kidney disease stage IV baseline creatinine 2.5-3.0 she is pretty stable thousand kidneys are concerned I think we will try to avoid dialysis and she is being followed closely at [facility].  She does not use nonsteroidal inflammatories ACE inhibitor is very obese and there is no administration of IV contrast.  She appears to be compensated from congestive heart failure standpoint  and continues on Demadex 80 mg twice daily and metolazone 2.5 mg Monday Wednesday Friday.  There is little to add from a renal standpoint I think she is fairly well compensated for as far as her creatinine is concerned.  I believe she would make a marginal dialysis candidate for outpatient dialysis however this will need to be discussed once her anemia has been resolved  Hypertension/volume she appears to be relatively well controlled.  I am pleased with the results of the Peridex drain to her right pleural effusion.  This seems to be helping tremendously  Anemia appears to have acute blood loss anemia injury to GI origin this will be leading to azotemia.  She may benefit from estrogens.  I will defer this to her primary team  Bones continue calcitriol 0.25 mcg every other day continue to follow calcium phosphorus  Mitral regurgitation followed by cardiology symptomatic  management  Congestive heart failure EF 25% followed by cardiology  History of atrial fibrillation is refused anticoagulation and is now with history of GI bleed.  Obstructive sleep apnea patient using BiPAP.   LOS: 1 Sherril Croon @TODAY @10 :38 AM

## 2018-12-08 NOTE — Progress Notes (Signed)
PROGRESS NOTE    [name] [name]  WIO:[id number] DOB: 12-06-1945 DOA: 12/05/2018 PCP: [name], MD    Brief Narrative:  73 year old female who presented with low hemoglobin.  She does have significant past medical history for systolic heart failure, atrial fibrillation, liver cirrhosis, chronic kidney disease stage IV and recent lower gastrointestinal bleeding (required 5 units packed red blood cells, discharge hemoglobin 8.8, her work-up included upper endoscopy and capsule endoscopy both were negative, patient felt to be unstable to proceed with colonoscopy at that time).    She was discharged on a stable condition to the skilled nursing facility, follow-up hemoglobin was down to 6.6 she was referred back to the hospital.  On her initial physical examination her blood pressure was 105/53, 86/52, heart rate 97, respiratory rate 18, temperature 98.1, oxygen saturation 96%, she had moist mucous membranes, positive pallor, lungs were clear to auscultation bilaterally, heart S1-S2 present and rhythmic, abdomen soft nontender, protuberant, positive bilateral lower extremity edema.  Sodium 133, potassium 3.2, chloride 94, bicarb 30, glucose 108, BUN 102, creatinine 3.2, white count 5.0, hemoglobin 6.9, hematocrit 23.0, platelets 153.   Patient was admitted to the hospital with working diagnosis of acute blood loss anemia.  Assessment & Plan:   Active Problems:   Chronic blood loss anemia   GI bleed   1. Acute blood loss anemia. Patient with persistent anemia, hb today down to 7,4 on admission was 6,9, will transfuse 2 units PRBC today and will continue to follow on hb and hct. Continue to monitor for signs of active bleeding, not good candidate for colonoscopy due to risk of worsening respiratory and cardiovascular status. Patient had extensive upper GI work up in the recent past. Tolerating po well.   2. CKD stage IV with anemia of chronic renal disease and hypokalemia. Renal function has  been stable, will continue to follow renal panel ane electrolytes will avoid further anemia. She had an adverse reaction to IV iron of prior hospitalization. Continue calcitriol and cholecalciferol for metabolic bone disease. Continue diuresis with metolazone. K correction with Kcl.   3. Right pleural effusion. Continue drainage per protocol. Continue oxymetry monitoring and supplemental 02 per [place].   4, HTN. Continue blood pressure control with isosorbide.   5. End stage biventricular failure, with LV systolic function 30 to 38%.  Continue diuresis with metolazone, blood pressure control with isosorbide, has a right pleural catheter.   6. Morbid obesity. Calculated BMI is 38.8.   DVT prophylaxis: scd   Code Status: dnr  Family Communication: I spoke with patient's daughters at the bedside over the phone and all questions were addressed.  Disposition Plan/ discharge barriers:  Pending stabilization of hb and hct.   Body mass index is 38.8 kg/m. Malnutrition Type:      Malnutrition Characteristics:      Nutrition Interventions:     RN Pressure Injury Documentation:     Consultants:   Nephrology     Procedures:     Antimicrobials:       Subjective: Patient is feeling better, but not yet back to baseline, no black stools, no hematochezia or melena. She is anxious to leave.  Objective: Vitals:   12/07/18 0531 12/07/18 1237 12/07/18 2033 12/08/18 0435  BP: (!) 92/46 125/63 104/63 (!) 96/53  Pulse: 92 95 (!) 105 99  Resp: 18 20 18 18   Temp: 98.2 F (36.8 C) 98 F (36.7 C) 98 F (36.7 C) 98.5 F (36.9 C)  TempSrc: Oral  Oral Oral  SpO2: 97% 96% 96% 94%  Weight:    109 kg  Height:        Intake/Output Summary (Last 24 hours) at 12/08/2018 1158 Last data filed at 12/08/2018 0931 Gross per 24 hour  Intake 605 ml  Output 3150 ml  Net -2545 ml   Filed Weights   12/06/18 0329 12/07/18 0216 12/08/18 0435  Weight: 112.6 kg 112.2 kg 109 kg     Examination:   General: deconditioned, positive dyspnea.  Neurology: Awake and alert, non focal  E ENT: mild pallor, no icterus, oral mucosa moist Cardiovascular: No JVD. S1-S2 present, rhythmic, no gallops, rubs, or murmurs. No lower extremity edema. Pulmonary: positive breath sounds bilaterally, adequate air movement, no wheezing, rhonchi or rales. Chest drain on the right.  Gastrointestinal. Abdomen protuberant no organomegaly, non tender, no rebound or guarding Skin. No rashes Musculoskeletal: no joint deformities     Data Reviewed: I have personally reviewed following labs and imaging studies  CBC: Recent Labs  Lab 12/05/18 1304 12/06/18 0545 12/06/18 1541 12/07/18 0853  WBC 5.0 4.5  --  4.4  HGB 6.9* 7.1* 7.9* 7.6*  HCT 23.0* 23.0* 26.0* 24.9*  MCV 97.5 95.8  --  95.4  PLT 153 146*  --  161*   Basic Metabolic Panel: Recent Labs  Lab 12/05/18 1304 12/07/18 0853 12/08/18 0732  NA 133* 134* 135  K 3.2* 2.4* 3.2*  CL 94* 93* 96*  CO2 30 30 30   GLUCOSE 108* 157* 97  BUN 102* 102* 99*  CREATININE 3.20* 2.95* 2.73*  CALCIUM 8.8* 8.5* 8.3*  MG  --  2.5*  --    GFR: Estimated Creatinine Clearance: 22.9 mL/min (A) (by C-G formula based on SCr of 2.73 mg/dL (H)). Liver Function Tests: Recent Labs  Lab 12/05/18 1304  AST 22  ALT 16  ALKPHOS 59  BILITOT 0.6  PROT 5.6*  ALBUMIN 3.0*   No results for input(s): LIPASE, AMYLASE in the last 168 hours. No results for input(s): AMMONIA in the last 168 hours. Coagulation Profile: No results for input(s): INR, PROTIME in the last 168 hours. Cardiac Enzymes: No results for input(s): CKTOTAL, CKMB, CKMBINDEX, TROPONINI in the last 168 hours. BNP (last 3 results) No results for input(s): PROBNP in the last 8760 hours. HbA1C: No results for input(s): HGBA1C in the last 72 hours. CBG: Recent Labs  Lab 12/06/18 0540 12/07/18 0749  GLUCAP 126* 99   Lipid Profile: No results for input(s): CHOL, HDL, LDLCALC, TRIG,  CHOLHDL, LDLDIRECT in the last 72 hours. Thyroid Function Tests: No results for input(s): TSH, T4TOTAL, FREET4, T3FREE, THYROIDAB in the last 72 hours. Anemia Panel: No results for input(s): VITAMINB12, FOLATE, FERRITIN, TIBC, IRON, RETICCTPCT in the last 72 hours.    Radiology Studies: I have reviewed all of the imaging during this hospital visit personally     Scheduled Meds: . sodium chloride   Intravenous Once  . allopurinol  300 mg Oral QPM  . calcitRIOL  0.25 mcg Oral QODAY  . cholecalciferol  1,000 Units Oral Daily  . feeding supplement (ENSURE ENLIVE)  237 mL Oral BID  . ferrous sulfate  325 mg Oral QHS  . isosorbide mononitrate  15 mg Oral Daily  . metolazone  2.5 mg Oral Q M,W,F  . multivitamin with minerals  1 tablet Oral QHS  . pantoprazole  40 mg Oral BID  . polyethylene glycol  17 g Oral QHS  . sodium chloride flush  3 mL Intravenous Q12H  .  torsemide  80 mg Oral BID   Continuous Infusions: . sodium chloride    . sodium chloride       LOS: 1 day        Mauricio Gerome Apley, MD

## 2018-12-08 NOTE — Progress Notes (Signed)
Called into patients room. Patient c/o leg pain and requesting pain medication, on MAR pt had medication that was discontinued, explained to patient that I would have to page provider to get an order for medication. Patient became upset and verbally aggressive stating I am supposed to get this medication twice a day and everyone knows this. Explained to patient that the medication was discontinued and I can page the provider to have it reordered. Patient also requesting to have her drainage catheter (right chest) drained. Explained that I would also request an order to have catheter drained and order equipment to properly drain device. Patient became upset, cursing at this nurse and stating "yall are going to kill me before I can get out of here" explained to patient that I will have to order proper equipment for he drain and I will be able to help her when it arrives. Patient is in no physical distress at this time, even rise and fall of respirations at an even rate. Patient verbalizes understanding of process. [facility]H paged for orders at patient request.

## 2018-12-08 NOTE — Progress Notes (Signed)
Patient has order to transfuse 2 units of blood. Per blood bank patient has antibodies and blood has to be located. [facility] only has one unit available now. State they may have 2nd unit available tomorrow. Boden[place],NP notified. Patient aware.

## 2018-12-08 NOTE — Progress Notes (Addendum)
Patient has some blisters that are weeping and are opened. I took foam out and assessed the blisters and MD said he will put a wound consult in. Will continue to monitor.  Blood bank called and updated me about the transfusion status, they reported that it may take several hours before we get the blood because patient blood comes from [place]. Will start saline and wait for blood to get ready. MD is aware.

## 2018-12-08 NOTE — Plan of Care (Signed)
  Problem: Education: Goal: Knowledge of General Education information will improve Description: Including pain rating scale, medication(s)/side effects and non-pharmacologic comfort measures Outcome: Progressing   Problem: Clinical Measurements: Goal: Ability to maintain clinical measurements within normal limits will improve Outcome: Progressing   Problem: Nutrition: Goal: Adequate nutrition will be maintained Outcome: Progressing   Problem: Coping: Goal: Level of anxiety will decrease Outcome: Progressing   Problem: Pain Managment: Goal: General experience of comfort will improve Outcome: Progressing   Problem: Safety: Goal: Ability to remain free from injury will improve Outcome: Progressing   Problem: Skin Integrity: Goal: Risk for impaired skin integrity will decrease Outcome: Progressing   

## 2018-12-08 NOTE — Progress Notes (Signed)
Patient refused the QAM glucose check. Wound care has consulted and recommended vaseline Gauze with coband on the lower extremities. Will pass that on to [place], night RN.

## 2018-12-08 NOTE — Consult Note (Signed)
[facility] Nurse wound consult note Reason for Consult: Patient with chronic venous insufficiency, three ruptured blisters evident. Wound type:vernous insufficiency Pressure Injury POA: NA Measurement: RLE:  3.5cm x 3cm x 0.1cm with pink, moist wound bed. Hemosiderin staining in periwound (gaiter) area). CEAP 6 LLE (medial): 2.5cm x 2cm x 0.1 CEAP 6 LLE (anterior):  3.5cm x 4cm x 0.1cm CEAP 6 Wound bed:As described above Drainage (amount, consistency, odor) serous, small amount  Periwound: As described above Dressing procedure/placement/frequency: I will write for topical care using white petrolatum gauze, top with ABD and secure with a dry boot (Kerlix and Coban). Patient prefers Coban to ACE.  States her daughter is a Marine scientist and will care for her LEs at home post discharge. [facility] nursing team will not follow, but will remain available to this patient, the nursing and medical teams.  Please re-consult if needed. Thanks, Maudie Flakes, MSN, RN, [place], Arther Abbott  Pager# [phone]

## 2018-12-08 NOTE — Progress Notes (Signed)
838m drained from pleurex drain/ patient tolerated well. No other needs voiced at this time

## 2018-12-09 ENCOUNTER — Encounter (HOSPITAL_COMMUNITY): Payer: Self-pay | Admitting: General Practice

## 2018-12-09 DIAGNOSIS — I10 Essential (primary) hypertension: Secondary | ICD-10-CM

## 2018-12-09 DIAGNOSIS — E611 Iron deficiency: Secondary | ICD-10-CM

## 2018-12-09 LAB — TYPE AND SCREEN
ABO/RH(D): O POS
Antibody Screen: POSITIVE
Unit division: 0
Unit division: 0
Unit division: 0

## 2018-12-09 LAB — CBC WITH DIFFERENTIAL/PLATELET
Abs Immature Granulocytes: 0.01 10*3/uL (ref 0.00–0.07)
BASOS PCT: 0 %
Basophils Absolute: 0 10*3/uL (ref 0.0–0.1)
Eosinophils Absolute: 0.2 10*3/uL (ref 0.0–0.5)
Eosinophils Relative: 4 %
HCT: 25.1 % — ABNORMAL LOW (ref 36.0–46.0)
Hemoglobin: 7.9 g/dL — ABNORMAL LOW (ref 12.0–15.0)
Immature Granulocytes: 0 %
Lymphocytes Relative: 15 %
Lymphs Abs: 0.8 10*3/uL (ref 0.7–4.0)
MCH: 29.6 pg (ref 26.0–34.0)
MCHC: 31.5 g/dL (ref 30.0–36.0)
MCV: 94 fL (ref 80.0–100.0)
Monocytes Absolute: 0.6 10*3/uL (ref 0.1–1.0)
Monocytes Relative: 10 %
Neutro Abs: 3.8 10*3/uL (ref 1.7–7.7)
Neutrophils Relative %: 71 %
Platelets: 142 10*3/uL — ABNORMAL LOW (ref 150–400)
RBC: 2.67 MIL/uL — AB (ref 3.87–5.11)
RDW: 20.3 % — ABNORMAL HIGH (ref 11.5–15.5)
WBC: 5.4 10*3/uL (ref 4.0–10.5)
nRBC: 0 % (ref 0.0–0.2)

## 2018-12-09 LAB — BPAM RBC
BLOOD PRODUCT EXPIRATION DATE: 202004172359
Blood Product Expiration Date: 202004202359
Blood Product Expiration Date: 202004202359
ISSUE DATE / TIME: 202003131147
ISSUE DATE / TIME: 202003152229
ISSUE DATE / TIME: 202003122350
UNIT TYPE AND RH: 5100
Unit Type and Rh: 5100
Unit Type and Rh: 5100

## 2018-12-09 LAB — BASIC METABOLIC PANEL
Anion gap: 7 (ref 5–15)
BUN: 99 mg/dL — ABNORMAL HIGH (ref 8–23)
CHLORIDE: 96 mmol/L — AB (ref 98–111)
CO2: 33 mmol/L — ABNORMAL HIGH (ref 22–32)
Calcium: 8.5 mg/dL — ABNORMAL LOW (ref 8.9–10.3)
Creatinine, Ser: 2.67 mg/dL — ABNORMAL HIGH (ref 0.44–1.00)
GFR calc Af Amer: 20 mL/min — ABNORMAL LOW (ref >60)
GFR calc non Af Amer: 17 mL/min — ABNORMAL LOW (ref >60)
Glucose, Bld: 105 mg/dL — ABNORMAL HIGH (ref 70–99)
Potassium: 2.9 mmol/L — ABNORMAL LOW (ref 3.5–5.1)
SODIUM: 136 mmol/L (ref 135–145)

## 2018-12-09 MED ORDER — DARBEPOETIN ALFA 150 MCG/0.3ML IJ SOSY
150.0000 ug | PREFILLED_SYRINGE | INTRAMUSCULAR | Status: DC
  Administered 2018-12-09: 150 ug via SUBCUTANEOUS
  Filled 2018-12-09 (×2): qty 0.3

## 2018-12-09 MED ORDER — POTASSIUM CHLORIDE CRYS ER 20 MEQ PO TBCR
40.0000 meq | EXTENDED_RELEASE_TABLET | ORAL | Status: AC
  Administered 2018-12-09 (×2): 40 meq via ORAL
  Filled 2018-12-09 (×2): qty 2

## 2018-12-09 MED ORDER — METHOCARBAMOL 500 MG PO TABS
500.0000 mg | ORAL_TABLET | Freq: Four times a day (QID) | ORAL | Status: DC | PRN
  Administered 2018-12-09 – 2018-12-11 (×3): 500 mg via ORAL
  Filled 2018-12-09 (×3): qty 1

## 2018-12-09 NOTE — Consult Note (Signed)
   [facility] Inpatient Consult   12/09/2018  TIANDRA SWOVELAND 11-Jul-1946 [id number]   Patient has an extreme high risk score [39%] for unplanned readmissions and hospitalizations.Patient was hospitalized for blood loss anemia.  This is her 5th hospitalization in the past 6 months and within the last 30 days.  Patient lives in [place] and admitted from [facility] skilled facility in [place].  Chart review reveals that the patient's current disposition is for returning to skilled nursing. No [facility] follow up at this time. Primary Care Provider is [name], MD   For questions contact:   Natividad Brood, RN BSN [facility] Liaison  [phone] business mobile phone Toll free office [phone]

## 2018-12-09 NOTE — Progress Notes (Signed)
Patient has home CPAP at bedside.  RT assistance not needed at this time. 

## 2018-12-09 NOTE — Progress Notes (Signed)
PROGRESS NOTE        PATIENT DETAILS Name: [name] [name] Age: 73 y.o. Sex: female Date of Birth: May 17, 1946 Admit Date: 12/05/2018 Admitting Physician Lady Deutscher, MD JFH:[id number], [name], MD  Brief Narrative: Patient is a 73 y.o. female with past medical history significant for CKD stage IV, hypertension, OSA, chronic systolic and diastolic CHF, liver cirrhosis secondary to NASH, permanent atrial fibrillation, and pacemaker placement who presented to the ED with chief complain of low hemoglobin. She was previously admitted from 2/11 to 2/24 for symptomatic acute anemia and was found to have slow lower GI bleed. Upper GI workup was normal, positive FOBT however patient was thought to be too unstable to proceed with colonoscopy. In ED, patient found to have hemoglobin of 6.9, labs otherwise at baseline. Patient has history of multiple blood antibodies and was admitted to the hospital for transfusion with working diagnosis of acute blood loss anemia.    Subjective: Patient with slight improvement but not yet back to baseline. She endorses epigastric pain. She denies chest pain, shortness of breath, hematochezia, melena, nausea, or vomiting. She is adamant about leaving.  Assessment/Plan: Active Problems:   Essential hypertension   Morbid obesity (HCC)   CKD (chronic kidney disease), stage IV (HCC)   Iron deficiency   Chronic blood loss anemia   GI bleed  1. Acute blood loss anemia, iron deficiency  -Patient with extensive upper GI workup in the past with no abnormalities. She is not a good candidate for colonoscopy due to risk of worsening respiratory and cardiovascular status.  -Transfused 1 unit of PRBC yesterday, hemoglobin improved from 7.4 to 7.9. Will continue to follow hgb and hct. Patient denies hematochezia or melena. Will continue to monitor for active signs of bleeding.  -Continue with iv Aranesp  2. CKD, stage IV with anemia of chronic disease  and hypokalemia  -BUN unchanged at 99. Cr with slight improvement at 2.67. Will continue to monitor with daily renal panels and electrolytes.  -K down from 3.2 to 2.9. Continue potassium correction with KCl.  -Continue calcitrol and cholecalciferol for metastatic bone disease  -Patient with normal-low BP. Continue diuresis with metolazone MWF, torsemide. Will monitor closely  -Nephrology following   3. Hypertension -SBP 102 and 97 this AM. Patient on isosorbide 49m qd- given marginal blood pressure, may consider decreasing dose or temporarily stopping   4. Right pleural effusion -Continue with drainage of Pleurex catheter per protocol  5. Chronic diastolic and systolic CHF with EF of 338-93%-I/O 53.8 mL. Patient appears volume overloaded, however given marginal BP, hesitant to increase diuresis. Will continue diuresis with metolazone and torsemide  6. Morbid obesity -Calculated BMI is 38.14. Outpatient follow-up     Echo (reviewed):EF 30-35% on Echo done on 10/07/18  Morning labs/Imaging ordered: yes  DVT Prophylaxis: Prophylactic SCD's  Code Status: DNR  Family Communication: None  Disposition Plan: Remain inpatient pending clinical improvement   Antimicrobial agents: Anti-infectives (From admission, onward)   None      CONSULTS: Nephrology  Wound care   MEDICATIONS: Scheduled Meds: . sodium chloride   Intravenous Once  . allopurinol  300 mg Oral QPM  . calcitRIOL  0.25 mcg Oral QODAY  . cholecalciferol  1,000 Units Oral Daily  . darbepoetin (ARANESP) injection - NON-DIALYSIS  150 mcg Subcutaneous Q Mon-1800  . feeding supplement  1 Container  Oral TID BM  . ferrous sulfate  325 mg Oral QHS  . isosorbide mononitrate  15 mg Oral Daily  . metolazone  2.5 mg Oral Q M,W,F  . multivitamin with minerals  1 tablet Oral QHS  . pantoprazole  40 mg Oral BID  . polyethylene glycol  17 g Oral QHS  . potassium chloride  40 mEq Oral Q4H  . sodium chloride flush  3 mL  Intravenous Q12H  . torsemide  80 mg Oral BID   Continuous Infusions: . sodium chloride     PRN Meds:.sodium chloride, clotrimazole, docusate sodium, HYDROcodone-acetaminophen, sodium chloride flush   PHYSICAL EXAM: Vital signs: Vitals:   12/09/18 0159 12/09/18 0200 12/09/18 0519 12/09/18 0900  BP: (!) 113/54  (!) 97/46 (!) 102/47  Pulse: 100  96 100  Resp:   13   Temp:   99 F (37.2 C)   TempSrc:   Oral   SpO2:   95%   Weight:  107.2 kg    Height:       Filed Weights   12/07/18 0216 12/08/18 0435 12/09/18 0200  Weight: 112.2 kg 109 kg 107.2 kg   Body mass index is 38.14 kg/m.   General: Awake, alert, not in any distress. Appears chronically ill  HEENT: Atraumatic. Oral mucosa moist. No scleral icterus  CV: Irregular. No murmurs, rubs, or gallops  Pulm: Good air entry bilaterally, no wheezing, rhonchi, or rales  GI: Mild epigastric pain with palpation. Not distended with no guarding Extremities: No lower extremity edema. Ruptured blisters of bilateral distal lower extremities, Coban in place  Neurology: Speech clear, sensation grossly intact. Musculoskeletal: No visible joint deformities  Skin: No rash   LABORATORY DATA: CBC: Recent Labs  Lab 12/05/18 1304 12/06/18 0545 12/06/18 1541 12/07/18 0853 12/08/18 1315 12/09/18 0509  WBC 5.0 4.5  --  4.4  --  5.4  NEUTROABS  --   --   --   --   --  3.8  HGB 6.9* 7.1* 7.9* 7.6* 7.4* 7.9*  HCT 23.0* 23.0* 26.0* 24.9* 24.0* 25.1*  MCV 97.5 95.8  --  95.4  --  94.0  PLT 153 146*  --  139*  --  142*    Basic Metabolic Panel: Recent Labs  Lab 12/05/18 1304 12/07/18 0853 12/08/18 0732 12/09/18 0509  NA 133* 134* 135 136  K 3.2* 2.4* 3.2* 2.9*  CL 94* 93* 96* 96*  CO2 30 30 30  33*  GLUCOSE 108* 157* 97 105*  BUN 102* 102* 99* 99*  CREATININE 3.20* 2.95* 2.73* 2.67*  CALCIUM 8.8* 8.5* 8.3* 8.5*  MG  --  2.5*  --   --     GFR: Estimated Creatinine Clearance: 23.3 mL/min (A) (by C-G formula based on SCr of  2.67 mg/dL (H)).  Liver Function Tests: Recent Labs  Lab 12/05/18 1304  AST 22  ALT 16  ALKPHOS 59  BILITOT 0.6  PROT 5.6*  ALBUMIN 3.0*   No results for input(s): LIPASE, AMYLASE in the last 168 hours. No results for input(s): AMMONIA in the last 168 hours.  Coagulation Profile: No results for input(s): INR, PROTIME in the last 168 hours.  Cardiac Enzymes: No results for input(s): CKTOTAL, CKMB, CKMBINDEX, TROPONINI in the last 168 hours.  BNP (last 3 results) No results for input(s): PROBNP in the last 8760 hours.  HbA1C: No results for input(s): HGBA1C in the last 72 hours.  CBG: Recent Labs  Lab 12/06/18 0540 12/07/18 0749  GLUCAP 126*  99    Lipid Profile: No results for input(s): CHOL, HDL, LDLCALC, TRIG, CHOLHDL, LDLDIRECT in the last 72 hours.  Thyroid Function Tests: No results for input(s): TSH, T4TOTAL, FREET4, T3FREE, THYROIDAB in the last 72 hours.  Anemia Panel: No results for input(s): VITAMINB12, FOLATE, FERRITIN, TIBC, IRON, RETICCTPCT in the last 72 hours.  Urine analysis:    Component Value Date/Time   COLORURINE YELLOW 11/09/2018 [place] 11/09/2018 1653   LABSPEC 1.009 11/09/2018 1653   PHURINE 7.0 11/09/2018 1653   GLUCOSEU NEGATIVE 11/09/2018 1653   HGBUR NEGATIVE 11/09/2018 1653   BILIRUBINUR NEGATIVE 11/09/2018 1653   KETONESUR NEGATIVE 11/09/2018 1653   PROTEINUR NEGATIVE 11/09/2018 1653   UROBILINOGEN 0.2 03/06/2013 2035   NITRITE NEGATIVE 11/09/2018 1653   LEUKOCYTESUR SMALL (A) 11/09/2018 1653    Sepsis Labs: Lactic Acid, Venous    Component Value Date/Time   LATICACIDVEN 0.7 06/21/2018 1853    MICROBIOLOGY: Recent Results (from the past 240 hour(s))  MRSA PCR Screening     Status: None   Collection Time: 12/05/18  8:23 PM  Result Value Ref Range Status   MRSA by PCR NEGATIVE NEGATIVE Final    Comment:        The GeneXpert MRSA Assay (FDA approved for NASAL specimens only), is one component of a  comprehensive MRSA colonization surveillance program. It is not intended to diagnose MRSA infection nor to guide or monitor treatment for MRSA infections. Performed at [facility], [place] [address], [place], [place] [address]     RADIOLOGY STUDIES/RESULTS: Dg Chest 1 View  Result Date: 12/02/2018 CLINICAL DATA:  Follow-up right pleural effusion. EXAM: CHEST  1 VIEW COMPARISON:  11/18/2018 FINDINGS: Stable position of single lead cardiac pacemaker/defibrillator and a catheter overlying the right lower thorax. Enlarged cardiac silhouette. Mediastinal contours appear intact. Calcific atherosclerotic disease of the aorta. There is no evidence of pneumothorax. Persistent small right pleural effusion. Probable mild interstitial pulmonary edema. Osseous structures are without acute abnormality. Soft tissues are grossly normal. IMPRESSION: 1. Persistent small right pleural effusion. 2. Probable mild interstitial pulmonary edema. Electronically Signed   By: Fidela Salisbury M.D.   On: 12/02/2018 08:37   Dg Chest Port 1 View  Result Date: 11/18/2018 CLINICAL DATA:  Dyspnea. EXAM: PORTABLE CHEST 1 VIEW COMPARISON:  Radiograph of November 11, 2018. FINDINGS: Stable cardiomegaly. Atherosclerosis of thoracic aorta is noted. Single lead left-sided pacemaker is unchanged in position. No pneumothorax is noted. Left lung is clear. Right-sided chest tube is unchanged in position. Small right pleural effusion is noted. Bony thorax is unremarkable. IMPRESSION: Stable position of right-sided chest tube without pneumothorax. Small right pleural effusion is noted. Aortic Atherosclerosis (ICD10-I70.0). Electronically Signed   By: Marijo Conception, M.D.   On: 11/18/2018 10:08   Dg Chest Port 1 View  Result Date: 11/11/2018 CLINICAL DATA:  Right-sided PleurX catheter drain placed per EXAM: PORTABLE CHEST 1 VIEW COMPARISON:  11/08/2018 and chest CT 11/08/2018 FINDINGS: Left-sided pacemaker unchanged. Right-sided  PleurX drainage catheter in place. Evidence of small right pleural effusion with significant interval improvement. Left lung is clear. Stable cardiomegaly. Remainder of the exam is unchanged. IMPRESSION: Interval improvement in right pleural effusion with small residual effusion. Right PleurX drainage catheter in place. Stable cardiomegaly. Electronically Signed   By: Marin Olp M.D.   On: 11/11/2018 11:17   Dg C-arm 1-60 Min-no Report  Result Date: 11/11/2018 Fluoroscopy was utilized by the requesting physician.  No radiographic interpretation.  LOS: 2 days   Deboraha Sprang, PA-S  12/09/2018, 12:17 PM

## 2018-12-09 NOTE — Progress Notes (Signed)
Patient refusing  Wound care and dressing to bilateral legs, blisters are open draining. Patient states dressing irritates her wound, she prefers it open to air, educated by two RNs on the importance of keeping wound close but patient still refused.

## 2018-12-09 NOTE — Progress Notes (Signed)
PROGRESS NOTE    [name] [name]  VPX:[id number] DOB: October 03, 1945 DOA: 12/05/2018 PCP: [name], MD    Brief Narrative:  73 year old female who presented with low hemoglobin.  She does have significant past medical history for systolic heart failure, atrial fibrillation, liver cirrhosis, chronic kidney disease stage IV and recent lower gastrointestinal bleeding (required 5 units packed red blood cells, discharge hemoglobin 8.8, her work-up included upper endoscopy and capsule endoscopy both were negative, patient felt to be unstable to proceed with colonoscopy at that time).    She was discharged on a stable condition to the skilled nursing facility, follow-up hemoglobin was down to 6.6 she was referred back to the hospital.  On her initial physical examination her blood pressure was 105/53, 86/52, heart rate 97, respiratory rate 18, temperature 98.1, oxygen saturation 96%, she had moist mucous membranes, positive pallor, lungs were clear to auscultation bilaterally, heart S1-S2 present and rhythmic, abdomen soft nontender, protuberant, positive bilateral lower extremity edema.  Sodium 133, potassium 3.2, chloride 94, bicarb 30, glucose 108, BUN 102, creatinine 3.2, white count 5.0, hemoglobin 6.9, hematocrit 23.0, platelets 153.   Patient was admitted to the hospital with working diagnosis of acute blood loss anemia   Assessment & Plan:   Active Problems:   Essential hypertension   Morbid obesity (HCC)   CKD (chronic kidney disease), stage IV (HCC)   Iron deficiency   Chronic blood loss anemia   GI bleed  1. Acute blood loss anemia. Patient has tolerated PRBC transfusion with appropriate increase in Hb and Hct, will follow cell count after second unit. No signs of active bleeding, will need close outpatient follow up. No signs of active GI bleeding.  2. CKD stage IV with anemia of chronic renal disease and hypokalemia. Renal function continue to stable with serum cr at 2,67 with  K at 2,9. Metabolic bone disease with calcitriol and cholecalciferol for metabolic bone disease. On diuresis with torsemide and metolazone, she continue to have lower extremity edema.   3. Right pleural effusion. Drain catheter every other day per protocol, continue oxymetry monitoring. Oxymetry is 99 % on room air.   4, HTN. On isosorbide for blood pressure control.  5. 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 Continue current regimen of torsemide and metolazone, continue blood pressure monitoring.  6. Morbid obesity. Calculated BMI is 38.8. Will need outpatient follow up.   DVT prophylaxis: scd   Code Status: dnr  Family Communication:  no family at the bedside  Body mass index is 38.14 kg/m. Malnutrition Type:      Malnutrition Characteristics:      Nutrition Interventions:     RN Pressure Injury Documentation:     Consultants:   Nephrology   Procedures:     Antimicrobials:       Subjective: Patient continue to have lower extremity edema, with local wounds, no nausea or vomiting, no dyspnea or chest pain.   Objective: Vitals:   12/09/18 0200 12/09/18 0519 12/09/18 0900 12/09/18 1329  BP:  (!) 97/46 (!) 102/47 (!) 101/55  Pulse:  96 100 (!) 105  Resp:  13  18  Temp:  99 F (37.2 C)  98.7 F (37.1 C)  TempSrc:  Oral  Oral  SpO2:  95%  99%  Weight: 107.2 kg     Height:        Intake/Output Summary (Last 24 hours) at 12/09/2018 1704 Last data filed at 12/09/2018 1500 Gross per 24 hour  Intake 846.16 ml  Output 2250 ml  Net -1403.84 ml   Filed Weights   12/07/18 0216 12/08/18 0435 12/09/18 0200  Weight: 112.2 kg 109 kg 107.2 kg    Examination:   General: Not in pain or dyspnea, deconditioned  Neurology: Awake and alert, non focal  E ENT: mild pallor, no icterus, oral mucosa moist Cardiovascular: No JVD. S1-S2 present, rhythmic, no gallops, rubs, or murmurs. No lower extremity edema. Pulmonary: decreased  breath sounds bilaterally at bases, no wheezing, rhonchi or rales. Gastrointestinal. Abdomen with, no organomegaly, non tender, no rebound or guarding Skin. No rashes Musculoskeletal: no joint deformities     Data Reviewed: I have personally reviewed following labs and imaging studies  CBC: Recent Labs  Lab 12/05/18 1304 12/06/18 0545 12/06/18 1541 12/07/18 0853 12/08/18 1315 12/09/18 0509  WBC 5.0 4.5  --  4.4  --  5.4  NEUTROABS  --   --   --   --   --  3.8  HGB 6.9* 7.1* 7.9* 7.6* 7.4* 7.9*  HCT 23.0* 23.0* 26.0* 24.9* 24.0* 25.1*  MCV 97.5 95.8  --  95.4  --  94.0  PLT 153 146*  --  139*  --  967*   Basic Metabolic Panel: Recent Labs  Lab 12/05/18 1304 12/07/18 0853 12/08/18 0732 12/09/18 0509  NA 133* 134* 135 136  K 3.2* 2.4* 3.2* 2.9*  CL 94* 93* 96* 96*  CO2 30 30 30  33*  GLUCOSE 108* 157* 97 105*  BUN 102* 102* 99* 99*  CREATININE 3.20* 2.95* 2.73* 2.67*  CALCIUM 8.8* 8.5* 8.3* 8.5*  MG  --  2.5*  --   --    GFR: Estimated Creatinine Clearance: 23.3 mL/min (A) (by C-G formula based on SCr of 2.67 mg/dL (H)). Liver Function Tests: Recent Labs  Lab 12/05/18 1304  AST 22  ALT 16  ALKPHOS 59  BILITOT 0.6  PROT 5.6*  ALBUMIN 3.0*   No results for input(s): LIPASE, AMYLASE in the last 168 hours. No results for input(s): AMMONIA in the last 168 hours. Coagulation Profile: No results for input(s): INR, PROTIME in the last 168 hours. Cardiac Enzymes: No results for input(s): CKTOTAL, CKMB, CKMBINDEX, TROPONINI in the last 168 hours. BNP (last 3 results) No results for input(s): PROBNP in the last 8760 hours. HbA1C: No results for input(s): HGBA1C in the last 72 hours. CBG: Recent Labs  Lab 12/06/18 0540 12/07/18 0749  GLUCAP 126* 99   Lipid Profile: No results for input(s): CHOL, HDL, LDLCALC, TRIG, CHOLHDL, LDLDIRECT in the last 72 hours. Thyroid Function Tests: No results for input(s): TSH, T4TOTAL, FREET4, T3FREE, THYROIDAB in the last 72  hours. Anemia Panel: No results for input(s): VITAMINB12, FOLATE, FERRITIN, TIBC, IRON, RETICCTPCT in the last 72 hours.    Radiology Studies: I have reviewed all of the imaging during this hospital visit personally     Scheduled Meds: . sodium chloride   Intravenous Once  . allopurinol  300 mg Oral QPM  . calcitRIOL  0.25 mcg Oral QODAY  . cholecalciferol  1,000 Units Oral Daily  . darbepoetin (ARANESP) injection - NON-DIALYSIS  150 mcg Subcutaneous Q Mon-1800  . feeding supplement  1 Container Oral TID BM  . ferrous sulfate  325 mg Oral QHS  . isosorbide mononitrate  15 mg Oral Daily  . metolazone  2.5 mg Oral Q M,W,F  . multivitamin with minerals  1 tablet Oral QHS  . pantoprazole  40 mg Oral BID  .  polyethylene glycol  17 g Oral QHS  . potassium chloride  40 mEq Oral Q4H  . sodium chloride flush  3 mL Intravenous Q12H  . torsemide  80 mg Oral BID   Continuous Infusions: . sodium chloride       LOS: 2 days        [name] Walthour Gerome Apley, MD

## 2018-12-09 NOTE — Progress Notes (Signed)
Subjective:  Weight seems to be coming down, only 1500 of UOP recorded last 24 hours, BP is soft at times.  Not happy that her potassium was stopped- doesn't think her swelling is worse than usual- no obvious uremic sxms - wants to go home   Objective Vital signs in last 24 hours: Vitals:   12/09/18 0149 12/09/18 0159 12/09/18 0200 12/09/18 0519  BP: (!) 85/44 (!) 113/54  (!) 97/46  Pulse: 89 100  96  Resp:    13  Temp:    99 F (37.2 C)  TempSrc:    Oral  SpO2: 92%   95%  Weight:   107.2 kg   Height:       Weight change: -1.86 kg  Intake/Output Summary (Last 24 hours) at 12/09/2018 0901 Last data filed at 12/09/2018 0856 Gross per 24 hour  Intake 1566.16 ml  Output 1500 ml  Net 66.16 ml    Assessment/ Plan: Pt is a 73 y.o. yo female with CHF, cirrhosis, CKD 4 who was admitted on 12/05/2018 with recurrent GIB, mild A on CRF and volume overload  Assessment/Plan: 1. Renal- has had CKD long term- followed by Dr, Deterding- baseline crt in the high 2's.  Crt unchanged- BUN is up likely due to GIB.  There are no acute indications for HD.  I agree with Dr. Justin Mend that pt would be poor long term OP dialysis candidate secondary to comorbid issues 2. HTN/vol- appears to have volume overload- initially better UOP on pretty high dose regimen for volume - torsemide 80 BID and metolazone 2.5 MWF.  Will be due for metolazone today.  Given marginal BP would hate to try and push it.  No o2 req  3. Anemia- blood loss anemia- has had reaction to iron, will give ESA - also being supported with transfusions  4. Secondary hyperparathyroidism- cont home dose calcitriol and d3 5. Hypokalemia- to get total of 80 meq today    Louis Meckel    Labs: Basic Metabolic Panel: Recent Labs  Lab 12/07/18 0853 12/08/18 0732 12/09/18 0509  NA 134* 135 136  K 2.4* 3.2* 2.9*  CL 93* 96* 96*  CO2 30 30 33*  GLUCOSE 157* 97 105*  BUN 102* 99* 99*  CREATININE 2.95* 2.73* 2.67*  CALCIUM 8.5* 8.3*  8.5*   Liver Function Tests: Recent Labs  Lab 12/05/18 1304  AST 22  ALT 16  ALKPHOS 59  BILITOT 0.6  PROT 5.6*  ALBUMIN 3.0*   No results for input(s): LIPASE, AMYLASE in the last 168 hours. No results for input(s): AMMONIA in the last 168 hours. CBC: Recent Labs  Lab 12/05/18 1304 12/06/18 0545  12/07/18 0853 12/08/18 1315 12/09/18 0509  WBC 5.0 4.5  --  4.4  --  5.4  NEUTROABS  --   --   --   --   --  3.8  HGB 6.9* 7.1*   < > 7.6* 7.4* 7.9*  HCT 23.0* 23.0*   < > 24.9* 24.0* 25.1*  MCV 97.5 95.8  --  95.4  --  94.0  PLT 153 146*  --  139*  --  142*   < > = values in this interval not displayed.   Cardiac Enzymes: No results for input(s): CKTOTAL, CKMB, CKMBINDEX, TROPONINI in the last 168 hours. CBG: Recent Labs  Lab 12/06/18 0540 12/07/18 0749  GLUCAP 126* 99    Iron Studies: No results for input(s): IRON, TIBC, TRANSFERRIN, FERRITIN in the last 72 hours.  Studies/Results: No results found. Medications: Infusions: . sodium chloride      Scheduled Medications: . sodium chloride   Intravenous Once  . allopurinol  300 mg Oral QPM  . calcitRIOL  0.25 mcg Oral QODAY  . cholecalciferol  1,000 Units Oral Daily  . feeding supplement  1 Container Oral TID BM  . ferrous sulfate  325 mg Oral QHS  . isosorbide mononitrate  15 mg Oral Daily  . metolazone  2.5 mg Oral Q M,W,F  . multivitamin with minerals  1 tablet Oral QHS  . pantoprazole  40 mg Oral BID  . polyethylene glycol  17 g Oral QHS  . sodium chloride flush  3 mL Intravenous Q12H  . torsemide  80 mg Oral BID    have reviewed scheduled and prn medications.  Physical Exam: General: upset that K was held- wants to go home Heart: tachy Lungs: mostly clear Abdomen: obese, soft Extremities: lower legs wrapped- reported blisters- she says for the last several weeks, she would not let me touch to check for pitting     12/09/2018,9:01 AM  LOS: 2 days

## 2018-12-09 NOTE — Progress Notes (Signed)
Patient refused Q AM CBG

## 2018-12-10 LAB — CBC WITH DIFFERENTIAL/PLATELET
Abs Immature Granulocytes: 0.02 10*3/uL (ref 0.00–0.07)
Basophils Absolute: 0 10*3/uL (ref 0.0–0.1)
Basophils Relative: 1 %
Eosinophils Absolute: 0.2 10*3/uL (ref 0.0–0.5)
Eosinophils Relative: 5 %
HEMATOCRIT: 25.5 % — AB (ref 36.0–46.0)
Hemoglobin: 7.8 g/dL — ABNORMAL LOW (ref 12.0–15.0)
Immature Granulocytes: 0 %
Lymphocytes Relative: 16 %
Lymphs Abs: 0.8 10*3/uL (ref 0.7–4.0)
MCH: 28.7 pg (ref 26.0–34.0)
MCHC: 30.6 g/dL (ref 30.0–36.0)
MCV: 93.8 fL (ref 80.0–100.0)
Monocytes Absolute: 0.5 10*3/uL (ref 0.1–1.0)
Monocytes Relative: 10 %
Neutro Abs: 3.4 10*3/uL (ref 1.7–7.7)
Neutrophils Relative %: 68 %
Platelets: 141 10*3/uL — ABNORMAL LOW (ref 150–400)
RBC: 2.72 MIL/uL — ABNORMAL LOW (ref 3.87–5.11)
RDW: 20 % — ABNORMAL HIGH (ref 11.5–15.5)
WBC: 4.9 10*3/uL (ref 4.0–10.5)
nRBC: 0 % (ref 0.0–0.2)

## 2018-12-10 LAB — RENAL FUNCTION PANEL
Albumin: 2.6 g/dL — ABNORMAL LOW (ref 3.5–5.0)
Anion gap: 10 (ref 5–15)
BUN: 92 mg/dL — AB (ref 8–23)
CO2: 32 mmol/L (ref 22–32)
Calcium: 8.7 mg/dL — ABNORMAL LOW (ref 8.9–10.3)
Chloride: 94 mmol/L — ABNORMAL LOW (ref 98–111)
Creatinine, Ser: 2.61 mg/dL — ABNORMAL HIGH (ref 0.44–1.00)
GFR calc Af Amer: 20 mL/min — ABNORMAL LOW (ref >60)
GFR calc non Af Amer: 18 mL/min — ABNORMAL LOW (ref >60)
GLUCOSE: 99 mg/dL (ref 70–99)
Phosphorus: 4.1 mg/dL (ref 2.5–4.6)
Potassium: 2.9 mmol/L — ABNORMAL LOW (ref 3.5–5.1)
Sodium: 136 mmol/L (ref 135–145)

## 2018-12-10 MED ORDER — POTASSIUM CHLORIDE CRYS ER 20 MEQ PO TBCR
40.0000 meq | EXTENDED_RELEASE_TABLET | ORAL | Status: AC
  Administered 2018-12-10 (×2): 40 meq via ORAL
  Filled 2018-12-10 (×2): qty 2

## 2018-12-10 MED ORDER — POTASSIUM CHLORIDE CRYS ER 20 MEQ PO TBCR: 40.0000 meq | EXTENDED_RELEASE_TABLET | ORAL | Status: DC

## 2018-12-10 NOTE — Progress Notes (Signed)
Subjective:  2100 of UOP recorded last 24 hours, BP is soft at times- renal function stable.   doesn't think her swelling is worse than usual, very cranky that her torsemide is being held and does not have order for potassium and is waiting for blood - no obvious uremic sxms - wants to go home   Objective Vital signs in last 24 hours: Vitals:   12/10/18 0450 12/10/18 0500 12/10/18 0823 12/10/18 0846  BP: (!) 80/47 106/64 (!) 85/42 (!) 80/48  Pulse: 98 98 96   Resp: 18     Temp: 98.4 F (36.9 C)     TempSrc: Oral     SpO2: 95% 95%    Weight: 108 kg     Height:       Weight change: 0.771 kg  Intake/Output Summary (Last 24 hours) at 12/10/2018 0857 Last data filed at 12/10/2018 0454 Gross per 24 hour  Intake 243 ml  Output 2850 ml  Net -2607 ml    Assessment/ Plan: Pt is a 73 y.o. yo female with CHF, cirrhosis, CKD 4 who was admitted on 12/05/2018 with recurrent GIB, mild A on CRF and volume overload  Assessment/Plan: 1. Renal- has had CKD long term- followed by Dr, Deterding- baseline crt in the high 2's.  Crt unchanged- BUN is up likely due to GIB.  There are no acute indications for HD.  I agree with Dr. Justin Mend that pt would be poor long term OP dialysis candidate secondary to comorbid issues 2. HTN/vol- appears to have volume overload- fine UOP on pretty high dose regimen for volume - torsemide 80 BID and metolazone 2.5 MWF.  Will be due for metolazone tomorrow.  Given marginal BP would hate to try and push it.  No o2 req.  She was upset that diuretic held, her BP is techinically low but not sure if accurate due to obese arms, sitting up in bed, I told nurse to give  3. Anemia- blood loss anemia- has had reaction to iron, gave ESA yesterday - also being supported with transfusions  4. Secondary hyperparathyroidism- cont home dose calcitriol and d3 5. Hypokalemia- got 80 meq yesterday - will give another 57 today   Pts CKD is at her baseline- she is on a appropriate diuretic regimen.   I agree with giving her more potassium in the short term to support hypokalemia- should be followed as OP and dosage adjusted.  I have not much to add.  Renal will sign off and make sure that she has follow up with Dr. Roxy Cedar    Labs: Basic Metabolic Panel: Recent Labs  Lab 12/08/18 0732 12/09/18 0509 12/10/18 0409  NA 135 136 136  K 3.2* 2.9* 2.9*  CL 96* 96* 94*  CO2 30 33* 32  GLUCOSE 97 105* 99  BUN 99* 99* 92*  CREATININE 2.73* 2.67* 2.61*  CALCIUM 8.3* 8.5* 8.7*  PHOS  --   --  4.1   Liver Function Tests: Recent Labs  Lab 12/05/18 1304 12/10/18 0409  AST 22  --   ALT 16  --   ALKPHOS 59  --   BILITOT 0.6  --   PROT 5.6*  --   ALBUMIN 3.0* 2.6*   No results for input(s): LIPASE, AMYLASE in the last 168 hours. No results for input(s): AMMONIA in the last 168 hours. CBC: Recent Labs  Lab 12/05/18 1304 12/06/18 0545  12/07/18 0853 12/08/18 1315 12/09/18 0509 12/10/18 0409  WBC 5.0  4.5  --  4.4  --  5.4 4.9  NEUTROABS  --   --   --   --   --  3.8 3.4  HGB 6.9* 7.1*   < > 7.6* 7.4* 7.9* 7.8*  HCT 23.0* 23.0*   < > 24.9* 24.0* 25.1* 25.5*  MCV 97.5 95.8  --  95.4  --  94.0 93.8  PLT 153 146*  --  139*  --  142* 141*   < > = values in this interval not displayed.   Cardiac Enzymes: No results for input(s): CKTOTAL, CKMB, CKMBINDEX, TROPONINI in the last 168 hours. CBG: Recent Labs  Lab 12/06/18 0540 12/07/18 0749  GLUCAP 126* 99    Iron Studies: No results for input(s): IRON, TIBC, TRANSFERRIN, FERRITIN in the last 72 hours. Studies/Results: No results found. Medications: Infusions: . sodium chloride      Scheduled Medications: . sodium chloride   Intravenous Once  . allopurinol  300 mg Oral QPM  . calcitRIOL  0.25 mcg Oral QODAY  . cholecalciferol  1,000 Units Oral Daily  . darbepoetin (ARANESP) injection - NON-DIALYSIS  150 mcg Subcutaneous Q Mon-1800  . feeding supplement  1 Container Oral TID BM  . ferrous sulfate   325 mg Oral QHS  . isosorbide mononitrate  15 mg Oral Daily  . metolazone  2.5 mg Oral Q M,W,F  . multivitamin with minerals  1 tablet Oral QHS  . pantoprazole  40 mg Oral BID  . polyethylene glycol  17 g Oral QHS  . sodium chloride flush  3 mL Intravenous Q12H  . torsemide  80 mg Oral BID    have reviewed scheduled and prn medications.  Physical Exam: General: upset that K was held- wants to go home Heart: tachy Lungs: mostly clear Abdomen: obese, soft Extremities: lower legs unwrapped-  Blisters healing-  she would not let me touch to check for pitting     12/10/2018,8:57 AM  LOS: 3 days

## 2018-12-10 NOTE — Progress Notes (Signed)
Pt refused daily dressing changes on BLE

## 2018-12-10 NOTE — Progress Notes (Signed)
PROGRESS NOTE    [name] [name]  GQB:[id number] DOB: 25-Aug-1946 DOA: 12/05/2018 PCP: [name], MD    Brief Narrative:  73 year old female who presented with low hemoglobin. She does have significant past medical history for systolic heart failure, atrial fibrillation, liver cirrhosis, chronic kidney disease stage IVand recent lower gastrointestinal bleeding (required 5 units packed red blood cells, discharge hemoglobin 8.8, her work-up included upper endoscopy and capsule endoscopy both were negative, patient felt to be unstable to proceed with colonoscopy at that time).   She was discharged on a stable condition to the skilled nursing facility, follow-up hemoglobin was down to 6.6 she was referred back to the hospital.On her initial physical examinationherblood pressure was 105/53, 86/52, heart rate 97, respiratory rate 18, temperature 98.1, oxygen saturation 96%, she had moist mucous membranes, positive pallor, lungswereclear to auscultation bilaterally, heart S1-S2 present and rhythmic, abdomen soft nontender, protuberant, positive bilateral lower extremity edema. Sodium 133, potassium 3.2, chloride 94, bicarb 30, glucose 108, BUN 102, creatinine 3.2, white count 5.0, hemoglobin 6.9, hematocrit 23.0, platelets 153.  Patient was admitted to the hospital with working diagnosis of acute blood loss anemia    Assessment & Plan:   Active Problems:   Essential hypertension   Morbid obesity (HCC)   CKD (chronic kidney disease), stage IV (HCC)   Iron deficiency   Chronic blood loss anemia   GI bleed   1. Acute blood loss anemia. Pending 4th unit PRBC to be [place] Her hb and hct have been stable with no further sings of bleeding. Had extensive upper GI workup and has been determined to be high risk for colonoscopy. Plan is to discharge after PRBC transfusion.   2.CKD stage IV with anemia of chronic renal disease and  hypokalemia. Continue aggressive K correction, will continue diuresis with metolazone. Her renal function has remained stable with serum cr at 2,61 and serum bicarbonate at 32. K today down to 2,9, will continue K correction with KCl 80 meq today in 2 divided doses. 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 Continue darbepoetin.   3. Right pleural effusion. continue to drain catheter every 48 H. Patient is oxygenating well.     4, HTN. Continue blood pressure control with isosorbide.   5. End stage biventricular failure, with LV systolic function 30 to 82% with hypotension. Improved volume status continue diuresis with torsemide and metolazone. Has persistent edema, in her lower extremities and dependent zones possible due to decreased oncotic pressure. Blood pressure has been low 80 to 800 systolic. Continue close monitoring. If persistent hypotension hold in diuresis.   6. Morbid obesity. Calculated BMI is 38.8.  7. Bilateral lowe extremity ulcers. Present on admission, continue local wound care.   DVT prophylaxis:scd Code Status:dnr Family Communication: no family at the bedside    Body mass index is 38.41 kg/m. Malnutrition Type:      Malnutrition Characteristics:      Nutrition Interventions:     RN Pressure Injury Documentation:     Consultants:   Nephrology   Procedures:     Antimicrobials:       Subjective: Patient is feeling better, no signs of melena or hematochezia, no nausea or vomiting. Patient reports improvement on her lower extremity edema but is not back to baseline.   Objective: Vitals:   12/10/18 0450 12/10/18 0500 12/10/18 0823 12/10/18 0846  BP: (!) 80/47 106/64 (!) 85/42 (!) 80/48  Pulse: 98 98 96   Resp:  18     Temp: 98.4 F (36.9 C)     TempSrc: Oral     SpO2: 95% 95%    Weight: 108 kg     Height:        Intake/Output Summary (Last 24 hours) at 12/10/2018 1214 Last data filed at 12/10/2018  0944 Gross per 24 hour  Intake 483 ml  Output 3350 ml  Net -2867 ml   Filed Weights   12/08/18 0435 12/09/18 0200 12/10/18 0450  Weight: 109 kg 107.2 kg 108 kg    Examination:   General: deconditioned  Neurology: Awake and alert, non focal  E ENT: mild pallor, no icterus, oral mucosa moist Cardiovascular: No JVD. S1-S2 present, rhythmic, no gallops, rubs, or murmurs. +++ non pitting lower extremity edema. Pulmonary: positive breath sounds bilaterally, adequate air movement, no wheezing, rhonchi or rales. Gastrointestinal. Abdomen protuberant with no organomegaly, non tender, no rebound or guarding Skin. Bilateral lowe extremity ulcers.  Musculoskeletal: no joint deformities     Data Reviewed: I have personally reviewed following labs and imaging studies  CBC: Recent Labs  Lab 12/05/18 1304 12/06/18 0545 12/06/18 1541 12/07/18 0853 12/08/18 1315 12/09/18 0509 12/10/18 0409  WBC 5.0 4.5  --  4.4  --  5.4 4.9  NEUTROABS  --   --   --   --   --  3.8 3.4  HGB 6.9* 7.1* 7.9* 7.6* 7.4* 7.9* 7.8*  HCT 23.0* 23.0* 26.0* 24.9* 24.0* 25.1* 25.5*  MCV 97.5 95.8  --  95.4  --  94.0 93.8  PLT 153 146*  --  139*  --  142* 031*   Basic Metabolic Panel: Recent Labs  Lab 12/05/18 1304 12/07/18 0853 12/08/18 0732 12/09/18 0509 12/10/18 0409  NA 133* 134* 135 136 136  K 3.2* 2.4* 3.2* 2.9* 2.9*  CL 94* 93* 96* 96* 94*  CO2 30 30 30  33* 32  GLUCOSE 108* 157* 97 105* 99  BUN 102* 102* 99* 99* 92*  CREATININE 3.20* 2.95* 2.73* 2.67* 2.61*  CALCIUM 8.8* 8.5* 8.3* 8.5* 8.7*  MG  --  2.5*  --   --   --   PHOS  --   --   --   --  4.1   GFR: Estimated Creatinine Clearance: 23.9 mL/min (A) (by C-G formula based on SCr of 2.61 mg/dL (H)). Liver Function Tests: Recent Labs  Lab 12/05/18 1304 12/10/18 0409  AST 22  --   ALT 16  --   ALKPHOS 59  --   BILITOT 0.6  --   PROT 5.6*  --   ALBUMIN 3.0* 2.6*   No results for input(s): LIPASE, AMYLASE in the last 168 hours. No  results for input(s): AMMONIA in the last 168 hours. Coagulation Profile: No results for input(s): INR, PROTIME in the last 168 hours. Cardiac Enzymes: No results for input(s): CKTOTAL, CKMB, CKMBINDEX, TROPONINI in the last 168 hours. BNP (last 3 results) No results for input(s): PROBNP in the last 8760 hours. HbA1C: No results for input(s): HGBA1C in the last 72 hours. CBG: Recent Labs  Lab 12/06/18 0540 12/07/18 0749  GLUCAP 126* 99   Lipid Profile: No results for input(s): CHOL, HDL, LDLCALC, TRIG, CHOLHDL, LDLDIRECT in the last 72 hours. Thyroid Function Tests: No results for input(s): TSH, T4TOTAL, FREET4, T3FREE, THYROIDAB in the last 72 hours. Anemia Panel: No results for input(s): VITAMINB12, FOLATE, FERRITIN, TIBC, IRON, RETICCTPCT in the last 72 hours.    Radiology Studies: I have reviewed  all of the imaging during this hospital visit personally     Scheduled Meds: . sodium chloride   Intravenous Once  . allopurinol  300 mg Oral QPM  . calcitRIOL  0.25 mcg Oral QODAY  . cholecalciferol  1,000 Units Oral Daily  . darbepoetin (ARANESP) injection - NON-DIALYSIS  150 mcg Subcutaneous Q Mon-1800  . feeding supplement  1 Container Oral TID BM  . ferrous sulfate  325 mg Oral QHS  . isosorbide mononitrate  15 mg Oral Daily  . metolazone  2.5 mg Oral Q M,W,F  . multivitamin with minerals  1 tablet Oral QHS  . pantoprazole  40 mg Oral BID  . polyethylene glycol  17 g Oral QHS  . potassium chloride  40 mEq Oral Q4H  . sodium chloride flush  3 mL Intravenous Q12H  . torsemide  80 mg Oral BID   Continuous Infusions: . sodium chloride       LOS: 3 days         Gerome Apley, MD

## 2018-12-10 NOTE — Progress Notes (Signed)
Patient has home CPAP at bedside.  RT assistance not needed at this time. 

## 2018-12-10 NOTE — TOC Progression Note (Signed)
Transition of Care [facility]) - Progression Note    Patient Details  Name: ANNEBELLE BOSTIC MRN: [id number] Date of Birth: 04-08-1946  Transition of Care [facility] CM/SW Daniels, LCSW Phone Number: 12/10/2018, 4:17 PM  Clinical Narrative:   PT is recommending HHPT. Spoke to patient and her daughters (over the phone), Marcie Bal and [place], and all are agreeable. Patient was getting home health through [facility] prior to SNF admission. Made referral but they are unable to manage a plurex drain. Will need to extend search. She got her equipment through [facility]. Patient already has a wheelchair, shower chair (73 years old), and a walker. She reports needing assistance with getting meals. Patient is able to fix her breakfast independently but her daughters work and can only prepare some of her meals throughout the weak. Marcie Bal is an Radio producer and works 8-16 hours per day. Sherri is a cardiologist. Patient and Marcie Bal asked about a home health service that provides more aide hours called a Medicaid Personal Care Screening ([facility]). Per RNCM, this will need to be facilitated through her PCP. Readmission Prevention screening completed.  Expected Discharge Plan: [place] Barriers to Discharge: Continued Medical Work up  Expected Discharge Plan and Services Expected Discharge Plan: [place]                                 Social Determinants of Health (SDOH) Interventions    Readmission Risk Interventions 30 Day Unplanned Readmission Risk Score     ED to Hosp-Admission (Current) from 12/05/2018 in [place] CHF  30 Day Unplanned Readmission Risk Score (%)  43 Filed at 12/10/2018 1600     This score is the patient's risk of an unplanned readmission within 30 days of being discharged (0 -100%). The score is based on dignosis, age, lab data, medications, orders, and past utilization.   Low:  0-14.9   Medium: 15-21.9   High: 22-29.9    Extreme: 30 and above       Readmission Risk Prevention Plan 12/10/2018  Transportation Screening Complete  Medication Review Press photographer) Patient Refused  PCP or Specialist appointment within 3-5 days of discharge Complete  HRI or [place] Not Complete  HRI or Home Care Consult Pt Refusal Comments Need to find home health agency that can manage pleurex drain.  SW Recovery Care/Counseling Consult Complete  Palliative Care Screening Not Applicable  Skilled Nursing Facility Not Applicable  Some recent data might be hidden

## 2018-12-10 NOTE — Progress Notes (Signed)
Pt refused daily dressing changes.

## 2018-12-10 NOTE — Care Management Important Message (Signed)
Important Message  Patient Details  Name: [name] [name] MRN: [id number] Date of Birth: 14-Nov-1945   Medicare Important Message Given:  Yes    Cassity Christian P Ferlin Fairhurst 12/10/2018, 1:57 PM

## 2018-12-10 NOTE — Evaluation (Signed)
Physical Therapy Evaluation Patient Details Name: [name] [name] MRN: [id number] DOB: 03/27/1946 Today's Date: 12/10/2018   History of Present Illness  [name] [name] is a 73 y.o. female admitted for low hemoglobin, blood transfusions; medical history significant of just of heart failure, atrial fibrillation, cirrhosis, kidney disease, GI bleeding with recent admission in February of this year for acute anemia.  Clinical Impression  Pt received in bed, willing to participate in therapy after endorsement by physician, but very irritable and annoyed with PT. She says she has already done her whole course of PT and is done (referring to her recent SNF stay). Education provided on PT role of helping pt get home from the acute care setting. Mobility overall Mod(I) to with supervision; pt is at a level where she will be safe at home with her daughters(see details below). Pt became very angry with PT when offered assistance or guarding during mobility. Pt refused multiple safety measures including non-slip socks for transfer and bed alarm.  Pt will be safe to DC home with her daughters from a PT standpoint once medical is resolved. Recommending HHPT for a safety assessment at home and to assess pt's safety with transfers in the home setting. Signing off on pt at this time, thank you for the referral.    Follow Up Recommendations Home health PT;Supervision - Intermittent    Equipment Recommendations  Other (comment);None recommended by PT(pt has necessary equipment)    Recommendations for Other Services       Precautions / Restrictions Precautions Precautions: Fall Precaution Comments: watch BP Restrictions Weight Bearing Restrictions: No      Mobility  Bed Mobility Overal bed mobility: Modified Independent Bed Mobility: Supine to Sit;Sit to Supine     Supine to sit: HOB elevated Sit to supine: Supervision   General bed mobility comments: Pt able to pull self into long sit in bed,  move sit to/from supine without physical assistance; becomes angry when PT gets too close  Transfers Overall transfer level: Needs assistance Equipment used: None Transfers: Stand Pivot Transfers;Sit to/from Stand Sit to Stand: Supervision Stand pivot transfers: Supervision       General transfer comment: Supervision for safety, pt refused min guard - did not want PT near her or touching her for assist, says touch will throw her off balance. Pt able to stand from bed and BSC and pivot to/from bed.  Ambulation/Gait             General Gait Details: pt refuses- does minimal ambulation at baseline, uses WC  Stairs            Wheelchair Mobility    Modified Rankin (Stroke Patients Only)       Balance Overall balance assessment: Needs assistance Sitting-balance support: No upper extremity supported;Feet supported Sitting balance-Leahy Scale: Good     Standing balance support: Single extremity supported;During functional activity Standing balance-Leahy Scale: Poor Standing balance comment: Reliant on UE support during stand pivot transfer                             Pertinent Vitals/Pain Pain Assessment: Faces Faces Pain Scale: No hurt    Home Living Family/patient expects to be discharged to:: Private residence Living Arrangements: Children Available Help at Discharge: Family;Available PRN/intermittently Type of Home: House Home Access: Level entry     Home Layout: Two level;Able to live on main level with bedroom/bathroom Home Equipment: Tub bench;Walker - 2 wheels;Bedside commode;Cane -  single point;Electric scooter;Hospital bed;Wheelchair - manual      Prior Function Level of Independence: Independent with assistive device(s)         Comments: Pt uses WC for mobility; WC does not fit in bathroom-stands from [facility] and uses RW for few steps into bathroom     Hand Dominance   Dominant Hand: Right    Extremity/Trunk Assessment   Upper  Extremity Assessment Upper Extremity Assessment: Overall WFL for tasks assessed    Lower Extremity Assessment Lower Extremity Assessment: Generalized weakness    Cervical / Trunk Assessment Cervical / Trunk Assessment: Kyphotic  Communication   Communication: No difficulties  Cognition Arousal/Alertness: Awake/alert Behavior During Therapy: Agitated;Impulsive Overall Cognitive Status: No family/caregiver present to determine baseline cognitive functioning                                 General Comments: Pt very agitated, insisting she does not need PT despite education of PT's role in the acute setting and Dr. present and wanting PT to see pt      General Comments      Exercises     Assessment/Plan    PT Assessment Patent does not need any further PT services  PT Problem List Decreased strength;Decreased balance;Decreased cognition;Decreased knowledge of precautions;Decreased mobility;Decreased knowledge of use of DME;Cardiopulmonary status limiting activity;Decreased activity tolerance;Decreased coordination;Decreased safety awareness       PT Treatment Interventions      PT Goals (Current goals can be found in the Care Plan section)  Acute Rehab PT Goals Patient Stated Goal: go home PT Goal Formulation: With patient Time For Goal Achievement: 12/24/18 Potential to Achieve Goals: Fair    Frequency     Barriers to discharge        Co-evaluation               AM-PAC PT "6 Clicks" Mobility  Outcome Measure Help needed turning from your back to your side while in a flat bed without using bedrails?: None Help needed moving from lying on your back to sitting on the side of a flat bed without using bedrails?: None Help needed moving to and from a bed to a chair (including a wheelchair)?: None Help needed standing up from a chair using your arms (e.g., wheelchair or bedside chair)?: None Help needed to walk in hospital room?: A Lot Help needed  climbing 3-5 steps with a railing? : Total 6 Click Score: 19    End of Session   Activity Tolerance: Patient tolerated treatment well Patient left: in bed;with call bell/phone within reach(pt refused bed alarm)   PT Visit Diagnosis: Muscle weakness (generalized) (M62.81);Difficulty in walking, not elsewhere classified (R26.2)    Time:  -      Charges:              Ronnell Guadalajara, SPT   Ronnell Guadalajara 12/10/2018, 1:37 PM

## 2018-12-11 DIAGNOSIS — D649 Anemia, unspecified: Secondary | ICD-10-CM

## 2018-12-11 LAB — BPAM RBC
Blood Product Expiration Date: 202003312359
ISSUE DATE / TIME: 202003171826
Unit Type and Rh: 5100

## 2018-12-11 LAB — BASIC METABOLIC PANEL
Anion gap: 9 (ref 5–15)
BUN: 92 mg/dL — ABNORMAL HIGH (ref 8–23)
CO2: 32 mmol/L (ref 22–32)
Calcium: 8.7 mg/dL — ABNORMAL LOW (ref 8.9–10.3)
Chloride: 96 mmol/L — ABNORMAL LOW (ref 98–111)
Creatinine, Ser: 2.61 mg/dL — ABNORMAL HIGH (ref 0.44–1.00)
GFR calc Af Amer: 20 mL/min — ABNORMAL LOW (ref >60)
GFR calc non Af Amer: 18 mL/min — ABNORMAL LOW (ref >60)
GLUCOSE: 208 mg/dL — AB (ref 70–99)
Potassium: 3.1 mmol/L — ABNORMAL LOW (ref 3.5–5.1)
Sodium: 137 mmol/L (ref 135–145)

## 2018-12-11 LAB — HEMOGLOBIN AND HEMATOCRIT, BLOOD
HCT: 26 % — ABNORMAL LOW (ref 36.0–46.0)
Hemoglobin: 8.3 g/dL — ABNORMAL LOW (ref 12.0–15.0)

## 2018-12-11 LAB — TYPE AND SCREEN
ABO/RH(D): O POS
Antibody Screen: POSITIVE
UNIT DIVISION: 0

## 2018-12-11 MED ORDER — POTASSIUM CHLORIDE CRYS ER 20 MEQ PO TBCR
40.0000 meq | EXTENDED_RELEASE_TABLET | Freq: Once | ORAL | Status: AC
  Administered 2018-12-11: 40 meq via ORAL
  Filled 2018-12-11: qty 2

## 2018-12-11 MED ORDER — POTASSIUM CHLORIDE CRYS ER 20 MEQ PO TBCR: 20.0000 meq | EXTENDED_RELEASE_TABLET | ORAL | 2 refills | Status: DC

## 2018-12-11 NOTE — Progress Notes (Signed)
To the best of my knowledge, documentation by Arville Lime [employer] nursing student is correct and complete.

## 2018-12-11 NOTE — TOC Progression Note (Addendum)
Transition of Care [facility]) - Progression Note    Patient Details  Name: [name] [name] MRN: [id number] Date of Birth: 10-Jul-1946  Transition of Care [facility]) CM/SW [place], LCSW Phone Number: 12/11/2018, 8:48 AM  Clinical Narrative: [facility] is able to manage pleurex drains. Faxed referral. CSW updated patient. She is asking about a hospital bed. RNCM will put in required documentation and CSW will fax to [facility] as well.  1:08 pm Hospital bed will be delivered today by [facility] moved some patients around and are able to start services on Friday instead of the weekend. Myriam Jacobson at [facility] is confirming PCP will manage patient's pleurex after discharge.  2:03 pm Faxed home health orders and discharge summary to [place] at [facility].  Expected Discharge Plan: [place] Barriers to Discharge: Continued Medical Work up  Expected Discharge Plan and Services Expected Discharge Plan: [place]                                 Social Determinants of Health (SDOH) Interventions    Readmission Risk Interventions 30 Day Unplanned Readmission Risk Score     ED to Hosp-Admission (Current) from 12/05/2018 in [place] CHF  30 Day Unplanned Readmission Risk Score (%)  42 Filed at 12/11/2018 0801     This score is the patient's risk of an unplanned readmission within 30 days of being discharged (0 -100%). The score is based on dignosis, age, lab data, medications, orders, and past utilization.   Low:  0-14.9   Medium: 15-21.9   High: 22-29.9   Extreme: 30 and above       Readmission Risk Prevention Plan 12/10/2018  Transportation Screening Complete  Medication Review Press photographer) Patient Refused  PCP or Specialist appointment within 3-5 days of discharge Complete  HRI or [place] Not Complete  HRI or Home Care Consult Pt Refusal Comments Need to find  home health agency that can manage pleurex drain.  SW Recovery Care/Counseling Consult Complete  Palliative Care Screening Not Applicable  Skilled Nursing Facility Not Applicable  Some recent data might be hidden

## 2018-12-11 NOTE — TOC Transition Note (Signed)
Transition of Care [facility]) - CM/SW Discharge Note   Patient Details  Name: [name] [name] MRN: [id number] Date of Birth: 1946/08/05  Transition of Care [facility]) CM/SW Contact:  Candie Chroman, LCSW Phone Number: 12/11/2018, 3:48 PM   Clinical Narrative:   Patient is discharging home today. Home health PT/RN/Aide will be provide through [facility]. Hospital bed to be delivered today through [facility]. Patient's daughter Marcie Bal is on the way to pick her up.  CSW signing off.  Final next level of care: [place] Barriers to Discharge: Barriers Resolved   Patient Goals and CMS Choice Patient states their goals for this hospitalization and ongoing recovery are:: to get stronger CMS Medicare.gov Compare Post Acute Care list provided to:: Patient Choice offered to / list presented to : Patient  Discharge Placement                Patient to be transferred to facility by: Daughter Marcie Bal will transport by car. Name of family member notified: Shivangi Lutz Patient and family notified of of transfer: 12/11/18  Discharge Plan and Services                        Social Determinants of Health (SDOH) Interventions     Readmission Risk Interventions Readmission Risk Prevention Plan 12/10/2018  Transportation Screening Complete  Medication Review Press photographer) Patient Refused  PCP or Specialist appointment within 3-5 days of discharge Complete  HRI or [place] Not Complete  HRI or Home Care Consult Pt Refusal Comments Need to find home health agency that can manage pleurex drain.  SW Recovery Care/Counseling Consult Complete  Palliative Care Screening Not Applicable  Skilled Nursing Facility Not Applicable  Some recent data might be hidden

## 2018-12-11 NOTE — Discharge Summary (Signed)
Physician Discharge Summary  [name] [name] XFG:[id number] DOB: May 15, 1946 DOA: 12/05/2018  PCP: [name], MD  Admit date: 12/05/2018 Discharge date: 12/11/2018  Time spent: 50 minutes  Recommendations for Outpatient Follow-up:  1. Follow-up nephrology in 1 week 2. Follow-up cardiology in 2 weeks 3. Follow-up PCP in 2 weeks   Discharge Diagnoses:  Active Problems:   Essential hypertension   Morbid obesity (HCC)   CKD (chronic kidney disease), stage IV (HCC)   Iron deficiency   Chronic blood loss anemia   GI bleed   Discharge Condition: Stable  Diet recommendation: Heart healthy diet  Filed Weights   12/09/18 0200 12/10/18 0450 12/11/18 0631  Weight: 107.2 kg 108 kg 107 kg    History of present illness:  73 year old female with a history of chronic systolic heart failure, atrial fibrillation, liver cirrhosis, CKD stage IV, GI bleed came from skilled nursing facility after she was found to have hemoglobin of 6.6.  Patient received a transfusion in the hospital.  Hospital Course:   Acute blood loss anemia-patient received 4 units of PRBC in the hospital.  Hemoglobin has improved to 8.8.  She had extensive upper GI work-up in the previous admission, no colonoscopy was done as she was deemed to be a high risk for colonoscopy.  At this time hemoglobin is stable.  CKD stage IV-creatinine at baseline at 2.61, potassium is 3.1.  Patient to follow-up with nephrology in 1 week.  Hypokalemia-patient's potassium has been low during this hospitalization today potassium is 3.1.  She does not want to stay in the hospital to correct the potassium.  We will discharge her home after giving 1 dose of 40 mEq x 1, she earlier got 40 mg of potassium in morning.  She will continue taking K. Dur 20 mEq p.o. twice daily and an extra dose of 40 mEq in the morning and 20 in the evening on the days when she takes metolazone.  Patient will follow-up with nephrology as outpatient.  Right pleural  effusion-patient has Pleurx catheter in place, continue to drain catheter every 48 hours.  Hypertension-blood pressure stable, continue home medications.  Chronic systolic and diastolic heart failure-stable, continue torsemide, metolazone.  Patient to follow-up with heart failure clinic as outpatient.   Procedures:  None  Consultations:  Nephrology  Discharge Exam: Vitals:   12/11/18 0631 12/11/18 1115  BP: (!) 90/48 (!) 103/46  Pulse: 72 (!) 101  Resp: 20   Temp: 98.8 F (37.1 C) 97.9 F (36.6 C)  SpO2: 96% 98%    General: Appears in no acute distress Cardiovascular: S1-S2, regular Respiratory: Clear to auscultation bilaterally  Discharge Instructions   Discharge Instructions    Diet - low sodium heart healthy   Complete by:  As directed    Increase activity slowly   Complete by:  As directed      Allergies as of 12/11/2018      Reactions   Beta Adrenergic Blockers Anaphylaxis, Other (See Comments)   "DEATH" (Bradycardia and her organs shut down)   Cephalexin Shortness Of Breath   Codeine Anaphylaxis, Swelling   Throat swelling   Contrast Media [iodinated Diagnostic Agents] Other (See Comments)   Patient states "I have chronic kidney disease, so the doctor said no dye in my veins."   Coreg [carvedilol] Other (See Comments)   Beta Blockers cause her organs to shut down (per patient)   Peppermint Flavor Shortness Of Breath   Prednisone Anaphylaxis, Shortness Of Breath, Swelling   Throat swells  Tape Other (See Comments)   States plastic tape blisters her skin   Ciprofloxacin Hives   Tolerating levofloxin    Latex Itching, Rash   Penicillins Hives   DID THE REACTION INVOLVE: Swelling of the face/tongue/throat, SOB, or low BP? Yes Sudden or severe rash/hives, skin peeling, or the inside of the mouth or nose? Yes Did it require medical treatment? Pt was in hospital at the time of reaction When did it last happen?Was in her mid-40's If all above answers  are "NO", may proceed with cephalosporin use.   Iron Other (See Comments)   Per Patient- Felt like skin was crawling, foaming at mouth, and felt like she was going "crazy"   Diamox [acetazolamide] Rash   Rash after 2 doses of diamox       Medication List    TAKE these medications   allopurinol 300 MG tablet Commonly known as:  ZYLOPRIM Take 300 mg by mouth every evening.   aspirin EC 325 MG tablet Take 325 mg by mouth at bedtime.   calcitRIOL 0.25 MCG capsule Commonly known as:  ROCALTROL Take 0.25 mcg by mouth every other day.   cholecalciferol 25 MCG (1000 UT) tablet Commonly known as:  VITAMIN D3 Take 1,000 Units by mouth daily.   clotrimazole-betamethasone cream Commonly known as:  LOTRISONE Apply 1 application topically 2 (two) times daily as needed (for irritation- affected sites).   COD LIVER OIL PO Take 1 capsule by mouth daily.   docusate sodium 100 MG capsule Commonly known as:  COLACE Take 100 mg by mouth daily as needed for mild constipation.   feeding supplement Liqd Take 1 Container by mouth 2 (two) times daily.   ferrous sulfate 325 (65 FE) MG tablet Take 325 mg by mouth at bedtime.   HYDROcodone-acetaminophen 10-325 MG tablet Commonly known as:  NORCO Take 1 tablet by mouth See admin instructions. Take 1 tablet by mouth two times a day and an additional 0.5 tablet two times a day as needed for pain   isosorbide mononitrate 30 MG 24 hr tablet Commonly known as:  Imdur Take 0.5 tablets (15 mg total) by mouth daily. What changed:  when to take this   methocarbamol 500 MG tablet Commonly known as:  ROBAXIN Take 1 tablet (500 mg total) by mouth every 6 (six) hours as needed for muscle spasms.   metolazone 2.5 MG tablet Commonly known as:  ZAROXOLYN Take 2.5 mg by mouth every Monday, Wednesday, and Friday.   Misc. Devices Misc 1 each by Does not apply route at bedtime. C-PAP   multivitamin with minerals Tabs tablet Take 1 tablet by mouth at  bedtime.   pantoprazole 40 MG tablet Commonly known as:  PROTONIX Take 1 tablet (40 mg total) by mouth 2 (two) times daily.   polyethylene glycol packet Commonly known as:  MIRALAX / GLYCOLAX Take 17 g by mouth at bedtime.   potassium chloride SA 20 MEQ tablet Commonly known as:  K-DUR,KLOR-CON Start on 10/24/18 What changed:    how much to take  how to take this  when to take this  additional instructions   torsemide 20 MG tablet Commonly known as:  DEMADEX Take 4 tablets (80 mg total) by mouth 2 (two) times daily. May also take 1 tablet (20 mg total) as needed (for weight greater than 246). What changed:  See the new instructions.   vitamin B-12 500 MCG tablet Commonly known as:  CYANOCOBALAMIN Take 500 mcg by mouth daily.  Durable Medical Equipment  (From admission, onward)         Start     Ordered   12/11/18 0935  For home use only DME Hospital bed  Once    Question Answer Comment  Patient has (list medical condition): CHF, Cirrhosis, Chronic Kidney Disease, anemia   The above medical condition requires: Patient requires the ability to reposition frequently   Head must be elevated greater than: 30 degrees   Bed type Semi-electric   [employer] Yes   Trapeze Bar Yes   Support Surface: Gel Overlay      12/11/18 0934         Allergies  Allergen Reactions  . Beta Adrenergic Blockers Anaphylaxis and Other (See Comments)    "DEATH" (Bradycardia and her organs shut down)  . Cephalexin Shortness Of Breath  . Codeine Anaphylaxis and Swelling    Throat swelling  . Contrast Media [Iodinated Diagnostic Agents] Other (See Comments)    Patient states "I have chronic kidney disease, so the doctor said no dye in my veins."  . Coreg [Carvedilol] Other (See Comments)    Beta Blockers cause her organs to shut down (per patient)  . Peppermint Flavor Shortness Of Breath  . Prednisone Anaphylaxis, Shortness Of Breath and Swelling    Throat swells   .  Tape Other (See Comments)    States plastic tape blisters her skin  . Ciprofloxacin Hives    Tolerating levofloxin   . Latex Itching and Rash  . Penicillins Hives    DID THE REACTION INVOLVE: Swelling of the face/tongue/throat, SOB, or low BP? Yes Sudden or severe rash/hives, skin peeling, or the inside of the mouth or nose? Yes Did it require medical treatment? Pt was in hospital at the time of reaction When did it last happen?Was in her mid-40's If all above answers are "NO", may proceed with cephalosporin use.  . Iron Other (See Comments)    Per Patient- Felt like skin was crawling, foaming at mouth, and felt like she was going "crazy"  . Diamox [Acetazolamide] Rash    Rash after 2 doses of diamox    Follow-up Information    Deterding, James, MD. Schedule an appointment as soon as possible for a visit in 1 week(s).   Specialty:  Nephrology Contact information: [address] [place] [address] [phone]            The results of significant diagnostics from this hospitalization (including imaging, microbiology, ancillary and laboratory) are listed below for reference.    Significant Diagnostic Studies: Dg Chest 1 View  Result Date: 12/02/2018 CLINICAL DATA:  Follow-up right pleural effusion. EXAM: CHEST  1 VIEW COMPARISON:  11/18/2018 FINDINGS: Stable position of single lead cardiac pacemaker/defibrillator and a catheter overlying the right lower thorax. Enlarged cardiac silhouette. Mediastinal contours appear intact. Calcific atherosclerotic disease of the aorta. There is no evidence of pneumothorax. Persistent small right pleural effusion. Probable mild interstitial pulmonary edema. Osseous structures are without acute abnormality. Soft tissues are grossly normal. IMPRESSION: 1. Persistent small right pleural effusion. 2. Probable mild interstitial pulmonary edema. Electronically Signed   By: Fidela Salisbury M.D.   On: 12/02/2018 08:37   Dg Chest Port 1  View  Result Date: 11/18/2018 CLINICAL DATA:  Dyspnea. EXAM: PORTABLE CHEST 1 VIEW COMPARISON:  Radiograph of November 11, 2018. FINDINGS: Stable cardiomegaly. Atherosclerosis of thoracic aorta is noted. Single lead left-sided pacemaker is unchanged in position. No pneumothorax is noted. Left lung is clear. Right-sided chest  tube is unchanged in position. Small right pleural effusion is noted. Bony thorax is unremarkable. IMPRESSION: Stable position of right-sided chest tube without pneumothorax. Small right pleural effusion is noted. Aortic Atherosclerosis (ICD10-I70.0). Electronically Signed   By: Marijo Conception, M.D.   On: 11/18/2018 10:08    Microbiology: Recent Results (from the past 240 hour(s))  MRSA PCR Screening     Status: None   Collection Time: 12/05/18  8:23 PM  Result Value Ref Range Status   MRSA by PCR NEGATIVE NEGATIVE Final    Comment:        The GeneXpert MRSA Assay (FDA approved for NASAL specimens only), is one component of a comprehensive MRSA colonization surveillance program. It is not intended to diagnose MRSA infection nor to guide or monitor treatment for MRSA infections. Performed at [facility], [place] [address], [place], [place] [address]      Labs: Basic Metabolic Panel: Recent Labs  Lab 12/07/18 0853 12/08/18 0732 12/09/18 0509 12/10/18 0409 12/11/18 1006  NA 134* 135 136 136 137  K 2.4* 3.2* 2.9* 2.9* 3.1*  CL 93* 96* 96* 94* 96*  CO2 30 30 33* 32 32  GLUCOSE 157* 97 105* 99 208*  BUN 102* 99* 99* 92* 92*  CREATININE 2.95* 2.73* 2.67* 2.61* 2.61*  CALCIUM 8.5* 8.3* 8.5* 8.7* 8.7*  MG 2.5*  --   --   --   --   PHOS  --   --   --  4.1  --    Liver Function Tests: Recent Labs  Lab 12/05/18 1304 12/10/18 0409  AST 22  --   ALT 16  --   ALKPHOS 59  --   BILITOT 0.6  --   PROT 5.6*  --   ALBUMIN 3.0* 2.6*   No results for input(s): LIPASE, AMYLASE in the last 168 hours. No results for input(s): AMMONIA in the last 168  hours. CBC: Recent Labs  Lab 12/05/18 1304 12/06/18 0545  12/07/18 0853 12/08/18 1315 12/09/18 0509 12/10/18 0409 12/11/18 0326  WBC 5.0 4.5  --  4.4  --  5.4 4.9  --   NEUTROABS  --   --   --   --   --  3.8 3.4  --   HGB 6.9* 7.1*   < > 7.6* 7.4* 7.9* 7.8* 8.3*  HCT 23.0* 23.0*   < > 24.9* 24.0* 25.1* 25.5* 26.0*  MCV 97.5 95.8  --  95.4  --  94.0 93.8  --   PLT 153 146*  --  139*  --  142* 141*  --    < > = values in this interval not displayed.   Cardiac Enzymes: No results for input(s): CKTOTAL, CKMB, CKMBINDEX, TROPONINI in the last 168 hours. BNP: BNP (last 3 results) Recent Labs    06/20/18 2249 10/06/18 1309 11/05/18 2224  BNP 160.6* 214.8* 228.9*    ProBNP (last 3 results) No results for input(s): PROBNP in the last 8760 hours.  CBG: Recent Labs  Lab 12/06/18 0540 12/07/18 0749  GLUCAP 126* 99       Signed:  Oswald Hillock MD.  [facility] 12/11/2018, 1:39 PM

## 2018-12-11 NOTE — Progress Notes (Signed)
CM talked to daughter Marcie Bal [phone]); she is the primary caregiver; Patient will be discharged home with PleurX Catheter; [place] services arranged with [facility] in [place]; they cannot see the patient until this weekend; Daughter is agreeable with the plan; Janet's sister will assist in her care and she is a PA; Attending MD updated. They are comfortable in managing the patient at home. CM also talked to Marcie Bal about a personal care service to be initiated by the PCP. All questions answered; Hospital bed ordered and will be delivered to the home this afternoon through [facility] per [facility]. No other needs voiced at this time. CM also informed Marcie Bal to call for any questions. 10 canisters ordered to be taken home at discharge.

## 2018-12-11 NOTE — Progress Notes (Cosign Needed)
Medically Necessity for Hospital Bed  Patient suffers from systolic heart failure, atrial fibrillation, liver cirrhosis, chronic kidney disease stage IV and anemia; patient has trouble breathing at night when head is elevated less than 30 degrees. Bed wedges do not provide elevation to resolve breathing issues. Heart Failure cause the patient to require frequent  And or immediate changes in body position which cannot be achieved with a normal bed.

## 2018-12-13 ENCOUNTER — Other Ambulatory Visit (HOSPITAL_COMMUNITY): Payer: Medicare Other

## 2018-12-18 ENCOUNTER — Other Ambulatory Visit (HOSPITAL_COMMUNITY): Payer: Medicare Other

## 2018-12-18 DIAGNOSIS — M1990 Unspecified osteoarthritis, unspecified site: Secondary | ICD-10-CM | POA: Diagnosis not present

## 2018-12-18 DIAGNOSIS — K7460 Unspecified cirrhosis of liver: Secondary | ICD-10-CM | POA: Diagnosis not present

## 2018-12-18 DIAGNOSIS — Z950 Presence of cardiac pacemaker: Secondary | ICD-10-CM | POA: Diagnosis not present

## 2018-12-18 DIAGNOSIS — K922 Gastrointestinal hemorrhage, unspecified: Secondary | ICD-10-CM | POA: Diagnosis not present

## 2018-12-18 DIAGNOSIS — Z8541 Personal history of malignant neoplasm of cervix uteri: Secondary | ICD-10-CM | POA: Diagnosis not present

## 2018-12-18 DIAGNOSIS — I130 Hypertensive heart and chronic kidney disease with heart failure and stage 1 through stage 4 chronic kidney disease, or unspecified chronic kidney disease: Secondary | ICD-10-CM | POA: Diagnosis not present

## 2018-12-18 DIAGNOSIS — Z6838 Body mass index (BMI) 38.0-38.9, adult: Secondary | ICD-10-CM | POA: Diagnosis not present

## 2018-12-18 DIAGNOSIS — K5792 Diverticulitis of intestine, part unspecified, without perforation or abscess without bleeding: Secondary | ICD-10-CM | POA: Diagnosis not present

## 2018-12-18 DIAGNOSIS — Z7982 Long term (current) use of aspirin: Secondary | ICD-10-CM | POA: Diagnosis not present

## 2018-12-18 DIAGNOSIS — G4730 Sleep apnea, unspecified: Secondary | ICD-10-CM | POA: Diagnosis not present

## 2018-12-18 DIAGNOSIS — I5042 Chronic combined systolic (congestive) and diastolic (congestive) heart failure: Secondary | ICD-10-CM | POA: Diagnosis not present

## 2018-12-18 DIAGNOSIS — N184 Chronic kidney disease, stage 4 (severe): Secondary | ICD-10-CM | POA: Diagnosis not present

## 2018-12-18 DIAGNOSIS — Z87891 Personal history of nicotine dependence: Secondary | ICD-10-CM | POA: Diagnosis not present

## 2018-12-18 DIAGNOSIS — M109 Gout, unspecified: Secondary | ICD-10-CM | POA: Diagnosis not present

## 2018-12-18 DIAGNOSIS — D500 Iron deficiency anemia secondary to blood loss (chronic): Secondary | ICD-10-CM | POA: Diagnosis not present

## 2018-12-18 DIAGNOSIS — I4821 Permanent atrial fibrillation: Secondary | ICD-10-CM | POA: Diagnosis not present

## 2018-12-18 DIAGNOSIS — I429 Cardiomyopathy, unspecified: Secondary | ICD-10-CM | POA: Diagnosis not present

## 2018-12-18 DIAGNOSIS — Z9181 History of falling: Secondary | ICD-10-CM | POA: Diagnosis not present

## 2018-12-18 DIAGNOSIS — K219 Gastro-esophageal reflux disease without esophagitis: Secondary | ICD-10-CM | POA: Diagnosis not present

## 2018-12-19 DIAGNOSIS — I130 Hypertensive heart and chronic kidney disease with heart failure and stage 1 through stage 4 chronic kidney disease, or unspecified chronic kidney disease: Secondary | ICD-10-CM | POA: Diagnosis not present

## 2018-12-19 DIAGNOSIS — I5042 Chronic combined systolic (congestive) and diastolic (congestive) heart failure: Secondary | ICD-10-CM | POA: Diagnosis not present

## 2018-12-19 DIAGNOSIS — I4821 Permanent atrial fibrillation: Secondary | ICD-10-CM | POA: Diagnosis not present

## 2018-12-19 DIAGNOSIS — N184 Chronic kidney disease, stage 4 (severe): Secondary | ICD-10-CM | POA: Diagnosis not present

## 2018-12-19 DIAGNOSIS — D500 Iron deficiency anemia secondary to blood loss (chronic): Secondary | ICD-10-CM | POA: Diagnosis not present

## 2018-12-19 DIAGNOSIS — K922 Gastrointestinal hemorrhage, unspecified: Secondary | ICD-10-CM | POA: Diagnosis not present

## 2018-12-24 ENCOUNTER — Encounter (HOSPITAL_COMMUNITY): Payer: Self-pay

## 2018-12-24 ENCOUNTER — Ambulatory Visit (HOSPITAL_COMMUNITY): Admit: 2018-12-24 | Payer: Medicare Other | Admitting: Gastroenterology

## 2018-12-24 DIAGNOSIS — D500 Iron deficiency anemia secondary to blood loss (chronic): Secondary | ICD-10-CM | POA: Diagnosis not present

## 2018-12-24 DIAGNOSIS — I130 Hypertensive heart and chronic kidney disease with heart failure and stage 1 through stage 4 chronic kidney disease, or unspecified chronic kidney disease: Secondary | ICD-10-CM | POA: Diagnosis not present

## 2018-12-24 DIAGNOSIS — I4821 Permanent atrial fibrillation: Secondary | ICD-10-CM | POA: Diagnosis not present

## 2018-12-24 DIAGNOSIS — N184 Chronic kidney disease, stage 4 (severe): Secondary | ICD-10-CM | POA: Diagnosis not present

## 2018-12-24 DIAGNOSIS — I5042 Chronic combined systolic (congestive) and diastolic (congestive) heart failure: Secondary | ICD-10-CM | POA: Diagnosis not present

## 2018-12-24 DIAGNOSIS — K922 Gastrointestinal hemorrhage, unspecified: Secondary | ICD-10-CM | POA: Diagnosis not present

## 2018-12-24 SURGERY — COLONOSCOPY WITH PROPOFOL
Anesthesia: Monitor Anesthesia Care

## 2018-12-31 ENCOUNTER — Observation Stay (HOSPITAL_COMMUNITY): Payer: Medicare Other

## 2018-12-31 ENCOUNTER — Other Ambulatory Visit: Payer: Self-pay

## 2018-12-31 ENCOUNTER — Encounter (HOSPITAL_COMMUNITY): Payer: Self-pay | Admitting: Emergency Medicine

## 2018-12-31 ENCOUNTER — Inpatient Hospital Stay (HOSPITAL_COMMUNITY)
Admission: EM | Admit: 2018-12-31 | Discharge: 2019-01-05 | DRG: 377 | Disposition: A | Discharge status: Home Health | Payer: Medicare Other | Source: Ambulatory Visit | Attending: Internal Medicine | Admitting: Internal Medicine

## 2018-12-31 DIAGNOSIS — I5042 Chronic combined systolic (congestive) and diastolic (congestive) heart failure: Secondary | ICD-10-CM | POA: Diagnosis present

## 2018-12-31 DIAGNOSIS — I428 Other cardiomyopathies: Secondary | ICD-10-CM | POA: Diagnosis present

## 2018-12-31 DIAGNOSIS — Z950 Presence of cardiac pacemaker: Secondary | ICD-10-CM | POA: Diagnosis not present

## 2018-12-31 DIAGNOSIS — I129 Hypertensive chronic kidney disease with stage 1 through stage 4 chronic kidney disease, or unspecified chronic kidney disease: Secondary | ICD-10-CM | POA: Diagnosis not present

## 2018-12-31 DIAGNOSIS — D500 Iron deficiency anemia secondary to blood loss (chronic): Secondary | ICD-10-CM | POA: Diagnosis not present

## 2018-12-31 DIAGNOSIS — Z87891 Personal history of nicotine dependence: Secondary | ICD-10-CM

## 2018-12-31 DIAGNOSIS — E871 Hypo-osmolality and hyponatremia: Secondary | ICD-10-CM | POA: Diagnosis not present

## 2018-12-31 DIAGNOSIS — I10 Essential (primary) hypertension: Secondary | ICD-10-CM | POA: Diagnosis present

## 2018-12-31 DIAGNOSIS — I480 Paroxysmal atrial fibrillation: Secondary | ICD-10-CM | POA: Diagnosis not present

## 2018-12-31 DIAGNOSIS — I272 Pulmonary hypertension, unspecified: Secondary | ICD-10-CM | POA: Diagnosis present

## 2018-12-31 DIAGNOSIS — N189 Chronic kidney disease, unspecified: Secondary | ICD-10-CM | POA: Diagnosis not present

## 2018-12-31 DIAGNOSIS — N2581 Secondary hyperparathyroidism of renal origin: Secondary | ICD-10-CM | POA: Diagnosis not present

## 2018-12-31 DIAGNOSIS — D631 Anemia in chronic kidney disease: Secondary | ICD-10-CM | POA: Diagnosis present

## 2018-12-31 DIAGNOSIS — D62 Acute posthemorrhagic anemia: Secondary | ICD-10-CM | POA: Diagnosis not present

## 2018-12-31 DIAGNOSIS — G4733 Obstructive sleep apnea (adult) (pediatric): Secondary | ICD-10-CM | POA: Diagnosis present

## 2018-12-31 DIAGNOSIS — I081 Rheumatic disorders of both mitral and tricuspid valves: Secondary | ICD-10-CM | POA: Diagnosis present

## 2018-12-31 DIAGNOSIS — K922 Gastrointestinal hemorrhage, unspecified: Secondary | ICD-10-CM | POA: Diagnosis not present

## 2018-12-31 DIAGNOSIS — N184 Chronic kidney disease, stage 4 (severe): Secondary | ICD-10-CM | POA: Diagnosis present

## 2018-12-31 DIAGNOSIS — K921 Melena: Secondary | ICD-10-CM | POA: Diagnosis not present

## 2018-12-31 DIAGNOSIS — Z7982 Long term (current) use of aspirin: Secondary | ICD-10-CM

## 2018-12-31 DIAGNOSIS — Z885 Allergy status to narcotic agent status: Secondary | ICD-10-CM

## 2018-12-31 DIAGNOSIS — Z66 Do not resuscitate: Secondary | ICD-10-CM

## 2018-12-31 DIAGNOSIS — R531 Weakness: Secondary | ICD-10-CM

## 2018-12-31 DIAGNOSIS — Z888 Allergy status to other drugs, medicaments and biological substances status: Secondary | ICD-10-CM

## 2018-12-31 DIAGNOSIS — Z6839 Body mass index (BMI) 39.0-39.9, adult: Secondary | ICD-10-CM

## 2018-12-31 DIAGNOSIS — D509 Iron deficiency anemia, unspecified: Secondary | ICD-10-CM | POA: Diagnosis present

## 2018-12-31 DIAGNOSIS — Z880 Allergy status to penicillin: Secondary | ICD-10-CM

## 2018-12-31 DIAGNOSIS — K5900 Constipation, unspecified: Secondary | ICD-10-CM | POA: Diagnosis present

## 2018-12-31 DIAGNOSIS — R1013 Epigastric pain: Secondary | ICD-10-CM | POA: Diagnosis not present

## 2018-12-31 DIAGNOSIS — D649 Anemia, unspecified: Secondary | ICD-10-CM | POA: Diagnosis present

## 2018-12-31 DIAGNOSIS — N179 Acute kidney failure, unspecified: Secondary | ICD-10-CM | POA: Diagnosis present

## 2018-12-31 DIAGNOSIS — M109 Gout, unspecified: Secondary | ICD-10-CM | POA: Diagnosis present

## 2018-12-31 DIAGNOSIS — E876 Hypokalemia: Secondary | ICD-10-CM | POA: Diagnosis present

## 2018-12-31 DIAGNOSIS — Z8249 Family history of ischemic heart disease and other diseases of the circulatory system: Secondary | ICD-10-CM

## 2018-12-31 DIAGNOSIS — Z800 Family history of malignant neoplasm of digestive organs: Secondary | ICD-10-CM

## 2018-12-31 DIAGNOSIS — I4821 Permanent atrial fibrillation: Secondary | ICD-10-CM | POA: Diagnosis present

## 2018-12-31 DIAGNOSIS — I130 Hypertensive heart and chronic kidney disease with heart failure and stage 1 through stage 4 chronic kidney disease, or unspecified chronic kidney disease: Secondary | ICD-10-CM | POA: Diagnosis not present

## 2018-12-31 DIAGNOSIS — I5043 Acute on chronic combined systolic (congestive) and diastolic (congestive) heart failure: Secondary | ICD-10-CM | POA: Diagnosis present

## 2018-12-31 DIAGNOSIS — M1990 Unspecified osteoarthritis, unspecified site: Secondary | ICD-10-CM | POA: Diagnosis not present

## 2018-12-31 DIAGNOSIS — I959 Hypotension, unspecified: Secondary | ICD-10-CM | POA: Diagnosis present

## 2018-12-31 DIAGNOSIS — K219 Gastro-esophageal reflux disease without esophagitis: Secondary | ICD-10-CM | POA: Diagnosis present

## 2018-12-31 DIAGNOSIS — Z833 Family history of diabetes mellitus: Secondary | ICD-10-CM

## 2018-12-31 DIAGNOSIS — N200 Calculus of kidney: Secondary | ICD-10-CM | POA: Diagnosis not present

## 2018-12-31 DIAGNOSIS — Z823 Family history of stroke: Secondary | ICD-10-CM

## 2018-12-31 LAB — COMPREHENSIVE METABOLIC PANEL
ALT: 14 U/L (ref 0–44)
AST: 21 U/L (ref 15–41)
Albumin: 2.8 g/dL — ABNORMAL LOW (ref 3.5–5.0)
Alkaline Phosphatase: 59 U/L (ref 38–126)
Anion gap: 14 (ref 5–15)
BUN: 161 mg/dL — ABNORMAL HIGH (ref 8–23)
CO2: 27 mmol/L (ref 22–32)
Calcium: 8.6 mg/dL — ABNORMAL LOW (ref 8.9–10.3)
Chloride: 86 mmol/L — ABNORMAL LOW (ref 98–111)
Creatinine, Ser: 3.57 mg/dL — ABNORMAL HIGH (ref 0.44–1.00)
GFR calc Af Amer: 14 mL/min — ABNORMAL LOW (ref >60)
GFR calc non Af Amer: 12 mL/min — ABNORMAL LOW (ref >60)
Glucose, Bld: 162 mg/dL — ABNORMAL HIGH (ref 70–99)
Potassium: 3.4 mmol/L — ABNORMAL LOW (ref 3.5–5.1)
Sodium: 127 mmol/L — ABNORMAL LOW (ref 135–145)
Total Bilirubin: 0.6 mg/dL (ref 0.3–1.2)
Total Protein: 5.5 g/dL — ABNORMAL LOW (ref 6.5–8.1)

## 2018-12-31 LAB — URINALYSIS, COMPLETE (UACMP) WITH MICROSCOPIC
Bilirubin Urine: NEGATIVE
Glucose, UA: NEGATIVE mg/dL
Ketones, ur: NEGATIVE mg/dL
Nitrite: NEGATIVE
Protein, ur: NEGATIVE mg/dL
Specific Gravity, Urine: 1.009 (ref 1.005–1.030)
pH: 6 (ref 5.0–8.0)

## 2018-12-31 LAB — DIFFERENTIAL
Abs Immature Granulocytes: 0.08 10*3/uL — ABNORMAL HIGH (ref 0.00–0.07)
Basophils Absolute: 0 10*3/uL (ref 0.0–0.1)
Basophils Relative: 0 %
Eosinophils Absolute: 0.1 10*3/uL (ref 0.0–0.5)
Eosinophils Relative: 2 %
Immature Granulocytes: 1 %
Lymphocytes Relative: 9 %
Lymphs Abs: 0.7 10*3/uL (ref 0.7–4.0)
Monocytes Absolute: 0.6 10*3/uL (ref 0.1–1.0)
Monocytes Relative: 7 %
Neutro Abs: 6.1 10*3/uL (ref 1.7–7.7)
Neutrophils Relative %: 81 %

## 2018-12-31 LAB — CBC
HCT: 18 % — ABNORMAL LOW (ref 36.0–46.0)
Hemoglobin: 5.3 g/dL — CL (ref 12.0–15.0)
MCH: 27.5 pg (ref 26.0–34.0)
MCHC: 29.4 g/dL — ABNORMAL LOW (ref 30.0–36.0)
MCV: 93.3 fL (ref 80.0–100.0)
Platelets: 173 10*3/uL (ref 150–400)
RBC: 1.93 MIL/uL — ABNORMAL LOW (ref 3.87–5.11)
RDW: 16.3 % — ABNORMAL HIGH (ref 11.5–15.5)
WBC: 6 10*3/uL (ref 4.0–10.5)
nRBC: 0 % (ref 0.0–0.2)

## 2018-12-31 LAB — FOLATE: Folate: 38.1 ng/mL (ref >5.9)

## 2018-12-31 LAB — RETICULOCYTES
Immature Retic Fract: 18.4 % — ABNORMAL HIGH (ref 2.3–15.9)
RBC.: 2.01 MIL/uL — ABNORMAL LOW (ref 3.87–5.11)
Retic Count, Absolute: 127.2 10*3/uL (ref 19.0–186.0)
Retic Ct Pct: 6.3 % — ABNORMAL HIGH (ref 0.4–3.1)

## 2018-12-31 LAB — FERRITIN: Ferritin: 40 ng/mL (ref 11–307)

## 2018-12-31 LAB — IRON AND TIBC
Iron: 13 ug/dL — ABNORMAL LOW (ref 28–170)
Saturation Ratios: 2 % — ABNORMAL LOW (ref 10.4–31.8)
TIBC: 521 ug/dL — ABNORMAL HIGH (ref 250–450)
UIBC: 508 ug/dL

## 2018-12-31 LAB — PROTIME-INR
INR: 1.1 (ref 0.8–1.2)
Prothrombin Time: 14.2 seconds (ref 11.4–15.2)

## 2018-12-31 LAB — MRSA PCR SCREENING: MRSA by PCR: NEGATIVE

## 2018-12-31 LAB — MAGNESIUM: Magnesium: 3.2 mg/dL — ABNORMAL HIGH (ref 1.7–2.4)

## 2018-12-31 LAB — PREPARE RBC (CROSSMATCH)

## 2018-12-31 LAB — SODIUM, URINE, RANDOM: Sodium, Ur: 24 mmol/L

## 2018-12-31 LAB — POC OCCULT BLOOD, ED: Fecal Occult Bld: POSITIVE — AB

## 2018-12-31 LAB — VITAMIN B12: Vitamin B-12: 3640 pg/mL — ABNORMAL HIGH (ref 180–914)

## 2018-12-31 LAB — CREATININE, URINE, RANDOM: Creatinine, Urine: 33.29 mg/dL

## 2018-12-31 MED ORDER — SODIUM CHLORIDE 0.9% FLUSH
3.0000 mL | Freq: Two times a day (BID) | INTRAVENOUS | Status: DC
  Administered 2018-12-31 – 2019-01-05 (×9): 3 mL via INTRAVENOUS

## 2018-12-31 MED ORDER — ONDANSETRON HCL 4 MG PO TABS: 4.0000 mg | ORAL_TABLET | Freq: Four times a day (QID) | ORAL | Status: DC | PRN

## 2018-12-31 MED ORDER — SODIUM CHLORIDE 0.9 % IV BOLUS
500.0000 mL | Freq: Once | INTRAVENOUS | Status: AC
  Administered 2018-12-31: 500 mL via INTRAVENOUS

## 2018-12-31 MED ORDER — PANTOPRAZOLE SODIUM 40 MG IV SOLR
40.0000 mg | Freq: Two times a day (BID) | INTRAVENOUS | Status: DC
  Administered 2018-12-31: 40 mg via INTRAVENOUS
  Filled 2018-12-31: qty 40

## 2018-12-31 MED ORDER — ONDANSETRON HCL 4 MG/2ML IJ SOLN: 4.0000 mg | Freq: Four times a day (QID) | INTRAMUSCULAR | Status: DC | PRN

## 2018-12-31 MED ORDER — SODIUM CHLORIDE 0.9% FLUSH: 3.0000 mL | INTRAVENOUS | Status: DC | PRN

## 2018-12-31 MED ORDER — SODIUM CHLORIDE 0.9% FLUSH
3.0000 mL | Freq: Two times a day (BID) | INTRAVENOUS | Status: DC
  Administered 2018-12-31 – 2019-01-05 (×10): 3 mL via INTRAVENOUS

## 2018-12-31 MED ORDER — FENTANYL CITRATE (PF) 100 MCG/2ML IJ SOLN: 12.5000 ug | INTRAMUSCULAR | Status: DC | PRN

## 2018-12-31 MED ORDER — SODIUM CHLORIDE 0.9% IV SOLUTION
Freq: Once | INTRAVENOUS | Status: AC
  Administered 2019-01-01: 02:00:00 via INTRAVENOUS

## 2018-12-31 MED ORDER — SODIUM CHLORIDE 0.9% IV SOLUTION: Freq: Once | INTRAVENOUS | Status: DC

## 2018-12-31 MED ORDER — SODIUM CHLORIDE 0.9 % IV SOLN: 250.0000 mL | INTRAVENOUS | Status: DC | PRN

## 2018-12-31 NOTE — ED Notes (Signed)
Per blood bank, type and screen will take a while to complete and blood will have to be from place in [place].

## 2018-12-31 NOTE — ED Provider Notes (Signed)
[facility] EMERGENCY DEPARTMENT Provider Note   CSN: [id number] Arrival date & time: 12/31/18  1704    History   Chief Complaint Chief Complaint  Patient presents with  . Anemia    HPI [name] [name] is a 73 y.o. female with a past medical history of morbid obesity, nonischemic cardiomyopathy, sleep apnea, A. fib, cervical cancer status post hysterectomy, CKD, who presents today for evaluation of low hemoglobin.  She reports that she went to her primary care doctor today and they drew labs showing that she was anemic.  She reports that since yesterday she has generally been feeling tired.  She does not know when she started having dark tarry sticky stools.  She reports feeling generally tired and unwell.  She reports cramping in her right thigh however also reports that that is not unusual for her.  Chart review shows that she has been hospitalized for this multiple times in the past 4 months.  She has had EGD, and capsule endoscopy however concern was with her respiratory status that she would not tolerate colonoscopy.   She has been seen by [facility] in the hospital.      HPI  Past Medical History:  Diagnosis Date  . Anemia   . Atrial fibrillation ([place])   . Cataract   . Cervical cancer ([place]) 1991   s/p hysterectomy  . Chronic cellulitis   . Chronic combined systolic and diastolic congestive heart failure, NYHA class 2 (HCC)    LVEF 25-30% with restrictive diastolic filling  . Degenerative joint disease   . Diverticulitis   . Essential hypertension   . GERD (gastroesophageal reflux disease)   . Gout   . Kidney stones   . Morbid obesity ([place])   . NICM (nonischemic cardiomyopathy) ([place]) 02/22/2017  . Osteoarthritis   . Permanent atrial fibrillation 05/04/2009   Qualifier: History of  By: Quentin Cornwall CMA, Janett Billow    . 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 Sinoatrial node dysfunction  [facility])    Status post PPM - Dr. Lovena Le  . Sleep apnea    CPAP    Patient Active Problem List   Diagnosis Date Noted  . GI bleed 12/07/2018  . Chronic blood loss anemia 12/05/2018  . Heme positive stool   . Acute on chronic systolic CHF (congestive heart failure) ([place])   . Severe anemia 11/06/2018  . Goals of care, counseling/discussion   . Palliative care by specialist   . DNR (do not resuscitate) discussion   . Hypertensive cardiovascular-renal disease, stage 1-4 or unspecified chronic kidney disease, with heart failure ([place]) 10/06/2018  . Hypokalemia 10/06/2018  . Hyponatremia 10/06/2018  . Thrombocytopenia ([place]) 10/06/2018  . Acute on chronic combined systolic and diastolic congestive heart failure, NYHA class 4 ([place]) 10/06/2018  . Acute on chronic systolic and diastolic heart failure, NYHA class 4 ([place]) 10/06/2018  . Gastrointestinal hemorrhage 10/03/2018  . Gastritis and gastroduodenitis   . AKI (acute kidney injury) ([place])   . Symptomatic anemia   . Acute renal failure superimposed on stage 4 chronic kidney disease ([place])   . Syncope 06/21/2018  . Iron deficiency 06/21/2018  . GERD (gastroesophageal reflux disease) 10/04/2017  . Acute on chronic combined systolic and diastolic CHF (congestive heart failure) ([place]) 04/02/2017  . NICM (nonischemic cardiomyopathy) ([place]) 02/22/2017  . CKD stage G3b/A1, GFR 30-44 and albumin creatinine ratio <30 mg/g (HCC)   . Dysphagia,  idiopathic 02/15/2017  . Pleural effusion on right 01/30/2017  . Osteoporosis 08/10/2016  . Liver cirrhosis secondary to NASH ([place]) 06/13/2016  . Chest pain 10/30/2014  . CKD (chronic kidney disease), stage IV ([place]) 03/07/2013  . Sinoatrial node dysfunction (HCC)   . Chronic combined systolic and diastolic CHF (congestive heart failure) ([place]) 05/07/2011  . Morbid obesity ([place]) 05/07/2011  . 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 OSA (obstructive sleep apnea) 05/05/2009  . Essential hypertension  05/04/2009  . Permanent atrial fibrillation 05/04/2009  . ALLERGIC RHINITIS 05/04/2009    Past Surgical History:  Procedure Laterality Date  . ABDOMINAL HYSTERECTOMY  1991  . BIOPSY  09/26/2018   Procedure: BIOPSY;  Surgeon: Lavena Bullion, DO;  Location: [facility];  Service: Gastroenterology;;  . CHEST TUBE INSERTION Right 11/11/2018   Procedure: INSERTION PLEURAL DRAINAGE CATHETER;  Surgeon: Ivin Poot, MD;  Location: [facility];  Service: Thoracic;  Laterality: Right;  . COLONOSCOPY  1998   one polyp per patient  . ESOPHAGOGASTRODUODENOSCOPY (EGD) WITH PROPOFOL N/A 09/26/2018   Procedure: ESOPHAGOGASTRODUODENOSCOPY (EGD) WITH PROPOFOL;  Surgeon: Lavena Bullion, DO;  Location: [facility];  Service: Gastroenterology;  Laterality: N/A;  . EYE SURGERY    . GIVENS CAPSULE STUDY N/A 10/09/2018   Procedure: GIVENS CAPSULE STUDY;  Surgeon: Carol Ada, MD;  Location: [facility];  Service: Endoscopy;  Laterality: N/A;  . IR THORACENTESIS ASP PLEURAL SPACE W/IMG GUIDE  10/09/2018  . PACEMAKER INSERTION  Nov 2000   St Jude with revision in 2011  . PERCUTANEOUS NEPHROLITHOTOMY  April 2012  . RIGHT HEART CATH N/A 06/28/2018   Procedure: RIGHT HEART CATH;  Surgeon: Jolaine Artist, MD;  Location: [facility];  Service: Cardiovascular;  Laterality: N/A;  . TEE WITHOUT CARDIOVERSION N/A 06/28/2018   Procedure: TRANSESOPHAGEAL ECHOCARDIOGRAM (TEE);  Surgeon: Jolaine Artist, MD;  Location: [facility] ENDOSCOPY;  Service: Cardiovascular;  Laterality: N/A;     OB History   No obstetric history on file.      Home Medications    Prior to Admission medications   Medication Sig Start Date End Date Taking? Authorizing Provider  allopurinol (ZYLOPRIM) 300 MG tablet Take 300 mg by mouth every evening.    Yes [provider]  aspirin EC 325 MG tablet Take 325 mg by mouth every other day.    Yes [provider]  calcitRIOL (ROCALTROL) 0.25 MCG capsule Take 0.25 mcg  by mouth every other day.    Yes [provider]  COD LIVER OIL PO Take 1 capsule by mouth daily.    Yes [provider]  docusate sodium (COLACE) 100 MG capsule Take 100 mg by mouth daily as needed for mild constipation.    Yes [provider]  ferrous sulfate 325 (65 FE) MG tablet Take 325 mg by mouth at bedtime.    Yes [provider]  HYDROcodone-acetaminophen (NORCO) 10-325 MG tablet Take 1 tablet by mouth See admin instructions. Take 1 tablet by mouth two times a day and an additional 0.5 tablet two times a day as needed for pain 11/15/18  Yes Gherghe, Vella Redhead, MD  isosorbide mononitrate (IMDUR) 30 MG 24 hr tablet Take 0.5 tablets (15 mg total) by mouth daily. Patient taking differently: Take 15 mg by mouth at bedtime.  12/10/17  Yes Fay Records, MD  metolazone (ZAROXOLYN) 2.5 MG tablet Take 2.5 mg by mouth See admin instructions. Take 1 tablet by mouth every Monday, Wednesday, Friday,  Saturday.   Yes [provider]  polyethylene glycol (MIRALAX / GLYCOLAX) packet Take 17 g by mouth at bedtime.   Yes [provider]  potassium chloride SA (K-DUR,KLOR-CON) 20 MEQ tablet Take 1 tablet (20 mEq total) by mouth 3 (three) times a week. Taking 59mq twice daily = 436m on Monday, Wednesday, Friday. Patient taking differently: Take 20-40 mEq by mouth See admin instructions. Take 2 tablets in the morning and 1 tablet by mouth in the evening on Monday, Wednesday, Friday, Saturday 12/11/18  Yes Lama, GaMarge DuncansMD  torsemide (DEMADEX) 20 MG tablet Take 4 tablets (80 mg total) by mouth 2 (two) times daily. May also take 1 tablet (20 mg total) as needed (for weight greater than 246). Patient taking differently: Take 80 mg by mouth two times a day and an additional 20 mg as needed for a weight gain >246 07/15/18  Yes Clegg, Amy D, NP  vitamin B-12 (CYANOCOBALAMIN) 500 MCG tablet Take 500 mcg by mouth daily.   Yes [provider]  feeding supplement  (BOOST HIGH PROTEIN) LIQD Take 1 Container by mouth 2 (two) times daily.     [provider]  methocarbamol (ROBAXIN) 500 MG tablet Take 1 tablet (500 mg total) by mouth every 6 (six) hours as needed for muscle spasms. Patient not taking: Reported on 12/31/2018 10/18/18   AdShelly CossMD  Misc. Devices MISC 1 each by Does not apply route at bedtime. C-PAP    [provider]  pantoprazole (PROTONIX) 40 MG tablet Take 1 tablet (40 mg total) by mouth 2 (two) times daily. Patient not taking: Reported on 12/31/2018 10/02/18   LeMahala MenghiniPA-C    Family History Family History  Problem Relation Age of Onset  . Heart attack Father   . Stroke Mother   . Diabetes Sister   . Colon cancer Other        seven family members  . Liver disease Neg Hx   . Other Neg Hx     Social History Social History   Tobacco Use  . Smoking status: Former Smoker    Packs/day: 1.00    Types: Cigarettes    Last attempt to quit: 09/26/1979    Years since quitting: 39.2  . Smokeless tobacco: Never Used  Substance Use Topics  . Alcohol use: No  . Drug use: No     Allergies   Beta adrenergic blockers; Cephalexin; Codeine; Contrast media [iodinated diagnostic agents]; Coreg [carvedilol]; Peppermint flavor; Prednisone; Tape; Ciprofloxacin; Latex; Penicillins; Iron; and Diamox [acetazolamide]   Review of Systems Review of Systems  Constitutional: Positive for fatigue. Negative for chills and fever.  Gastrointestinal: Negative for abdominal pain, diarrhea, nausea and vomiting.  Musculoskeletal: Negative for back pain.  Neurological: Positive for weakness (Generalized).  All other systems reviewed and are negative.    Physical Exam Updated Vital Signs BP (!) 125/49   Pulse 90   Temp 98.5 F (36.9 C) (Oral)   Resp 16   SpO2 97%   Physical Exam Vitals signs and nursing note reviewed.  Constitutional:      General: She is not in acute distress.    Appearance: She is well-developed.  She is obese. She is ill-appearing (Chronically).  HENT:     Head: Normocephalic and atraumatic.     Mouth/Throat:     Mouth: Mucous membranes are moist.  Eyes:     Conjunctiva/sclera: Conjunctivae normal.  Neck:     Musculoskeletal: Neck supple.  Cardiovascular:  Rate and Rhythm: Normal rate and regular rhythm.     Heart sounds: No murmur.     Comments: Rate is normal at rest, however she gets tachycardic while moving around in bed and short of breath. Pulmonary:     Effort: Pulmonary effort is normal. No respiratory distress.     Breath sounds: Normal breath sounds.  Abdominal:     General: There is no distension.     Palpations: Abdomen is soft. There is no mass.     Tenderness: There is no abdominal tenderness.     Hernia: No hernia is present.  Musculoskeletal:     Right lower leg: Edema present.     Left lower leg: Edema present.  Skin:    General: Skin is warm and dry.     Comments: Scabs present along with skin discoloration from chronic venous stasis on bilateral legs, left worse than right.  Neurological:     General: No focal deficit present.     Mental Status: She is alert and oriented to person, place, and time.  Psychiatric:        Mood and Affect: Mood normal.      ED Treatments / Results  Labs (all labs ordered are listed, but only abnormal results are displayed) Labs Reviewed  COMPREHENSIVE METABOLIC PANEL - Abnormal; Notable for the following components:      Result Value   Sodium 127 (*)    Potassium 3.4 (*)    Chloride 86 (*)    Glucose, Bld 162 (*)    BUN 161 (*)    Creatinine, Ser 3.57 (*)    Calcium 8.6 (*)    Total Protein 5.5 (*)    Albumin 2.8 (*)    GFR calc non Af Amer 12 (*)    GFR calc Af Amer 14 (*)    All other components within normal limits  CBC - Abnormal; Notable for the following components:   RBC 1.93 (*)    Hemoglobin 5.3 (*)    HCT 18.0 (*)    MCHC 29.4 (*)    RDW 16.3 (*)    All other components within normal  limits  RETICULOCYTES - Abnormal; Notable for the following components:   Retic Ct Pct 6.3 (*)    RBC. 2.01 (*)    Immature Retic Fract 18.4 (*)    All other components within normal limits  MAGNESIUM - Abnormal; Notable for the following components:   Magnesium 3.2 (*)    All other components within normal limits  DIFFERENTIAL - Abnormal; Notable for the following components:   Abs Immature Granulocytes 0.08 (*)    All other components within normal limits  VITAMIN B12  FOLATE  IRON AND TIBC  FERRITIN  POC OCCULT BLOOD, ED  TYPE AND SCREEN  PREPARE RBC (CROSSMATCH)    EKG None  Radiology No results found.  Procedures .Critical Care Performed by: Lorin Glass, PA-C Authorized by: Lorin Glass, PA-C   Critical care provider statement:    Critical care time (minutes):  45   Critical care was time spent personally by me on the following activities:  Discussions with consultants, evaluation of patient's response to treatment, examination of patient, ordering and performing treatments and interventions, ordering and review of laboratory studies, ordering and review of radiographic studies, pulse oximetry, re-evaluation of patient's condition, obtaining history from patient or surrogate and review of old charts   (including critical care time)  Medications Ordered in ED Medications  0.9 %  sodium chloride infusion (Manually program via Guardrails IV Fluids) (has no administration in time range)     Initial Impression / Assessment and Plan / ED Course  I have reviewed the triage vital signs and the nursing notes.  Pertinent labs & imaging results that were available during my care of the patient were reviewed by me and considered in my medical decision making (see chart for details).  Clinical Course as of Dec 31 1947  Tue Dec 31, 2018  1935 Spoke with Dr. Carlean Purl who will see patient in consult.    [EH]  1949 Spoke with Dr. Myna Hidalgo who will come see patient  for admission.    [EH]    Clinical Course User Index [EH] Lorin Glass, PA-C      Patient presents today for evaluation of low hemoglobin.  She has a history of GI bleeding and has previously been evaluated by [facility] for this.  She reports that since yesterday she has generally been feeling fatigued.  Labs were obtained here showing a hemoglobin of 5.3 which is consistent with her hemoglobin of 5.4 reported at PCP.  She had frank melena on rectal exam.  RBC transfusion was ordered, patient has known antibodies requiring her blood often times to come from blood bank in [place].  Her blood pressures were soft while in the emergency room, however she had normal mentation, will get fluid bolus.  I spoke with Dr. Carlean Purl from GI who will come see patient.  Her creatinine is up today at 3.57, review shows her baseline appears to be around 2.6.  She is slightly hyponatremic with a sodium of 127.    I spoke with Dr. Myna Hidalgo who agreed to admit patient.   As she has been previously evaluated by GI and hospitalized for this before and does not have abdominal pain do not feel that a CT scan would be emergently helpful.     Final Clinical Impressions(s) / ED Diagnoses   Final diagnoses:  Symptomatic anemia    ED Discharge Orders    None       Ollen Gross 12/31/18 2024    Gareth Morgan, MD 01/04/19 2038

## 2018-12-31 NOTE — ED Triage Notes (Signed)
Pt sent by kidney doctor for hemoglobin of 5.4. Hx of same. Denies obvious blood in stool, no shortness of breath, chest pain. No blood thinner use.

## 2018-12-31 NOTE — Consult Note (Signed)
Consultation  Referring Provider:     [facility] hospital[facility] Primary Care Physician:  [name], MD Primary Gastroenterologist:     Barney Drain MD    Reason for Consultation:     Heme positive anemia recurrent     Impression / Plan:   Chronic recurrent heme positive stool and iron deficiency anemia suspected to be slow chronic GI bleeding.  She has an acute on chronic blood loss anemia.  Source not clear.  Had erosive gastritis on EGD in January on the second and a negative capsule endoscopy later that month.  She has declined to have a colonoscopy multiple times, she tells me Dr. Haroldine Laws does not think she should do it  I think she should be considered for a colonoscopy though it would not be unreasonable to repeat an upper GI endoscopy in this patient as well.  She needs to be tuned up medically, transfused etc. and we should have a discussion with her cardiologist Dr. Haroldine Laws.  She is clearly at higher risk of problems but she has recurrent iron deficiency anemia and heme positive stools of unclear etiology.  We have not excluded a lower GI source.  She does have imaging suggestive of cirrhosis, but has normal platelets.  So portal hypertension as a cause of bleeding does not seem likely to me.  Coags not done yet this admission but need to be.  I think she has more likely an enlarged liver related to right heart failure which is caused ascites and a right pleural effusion that has been treated with a Pleurx catheter.  She is a DNR.  We will reassess her in the morning, and await cardiology input.  I think she merits consideration for endoscopic evaluation again including a colonoscopy though that may not be easy to do, it needs to be considered to try to find a source of this anemia that may be treatable.  Or at least, if it is a cancer provide some prognosis and better understanding.  Gatha Mayer, MD, [facility] 12/31/2018 9:22 PM Pager [phone]   HPI:   [name] [name] is a 73 y.o. female with history of morbid obesity, atrial fibrillation status post ablation and St. 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  She has had multiple admissions for this in the last couple of months and had a work-up with an EGD showing gastritis in January and then a negative small bowel capsule endoscopy.  She was seen again in February by our service and declined a colonoscopy.  She is short of breath and having some chest pain at this time.  She is not in any acute severe distress.  She feels very cold.  She cannot tell me what color the stools are but they are reported to be dark by the ER staff and they are heme positive.  Somewhat limited historian.  She is to be transfused for hemoglobin of 5.  It was 9 in mid March.  Her ferritin is 40 it was 150 in February.  I believe she has had Feraheme infusions in the past as well.  Past Medical History:  Diagnosis Date  . Anemia   . Atrial fibrillation ([place])   . Cataract   . Cervical cancer ([place]) 1991   s/p hysterectomy  . Chronic cellulitis   . Chronic combined systolic  and diastolic congestive heart failure, NYHA class 2 (HCC)    LVEF 25-30% with restrictive diastolic filling  . Degenerative joint disease   . Diverticulitis   . Essential hypertension   . GERD (gastroesophageal reflux disease)   . Gout   . Kidney stones   . Morbid obesity ([place])   . NICM (nonischemic cardiomyopathy) ([place]) 02/22/2017  . Osteoarthritis   . Permanent atrial fibrillation 05/04/2009   Qualifier: History of  By: Quentin Cornwall CMA, Janett Billow    . 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   Qualifier: Diagnosis of  By: Lovena Le, MD, [facility], Binnie Kand   . Sinoatrial node dysfunction [facility])    Status post PPM - Dr. Lovena Le  . Sleep  apnea    CPAP    Past Surgical History:  Procedure Laterality Date  . ABDOMINAL HYSTERECTOMY  1991  . BIOPSY  09/26/2018   Procedure: BIOPSY;  Surgeon: Lavena Bullion, DO;  Location: [place];  Service: Gastroenterology;;  . CHEST TUBE INSERTION Right 11/11/2018   Procedure: INSERTION PLEURAL DRAINAGE CATHETER;  Surgeon: Ivin Poot, MD;  Location: [place];  Service: Thoracic;  Laterality: Right;  . COLONOSCOPY  1998   one polyp per patient  . ESOPHAGOGASTRODUODENOSCOPY (EGD) WITH PROPOFOL N/A 09/26/2018   Procedure: ESOPHAGOGASTRODUODENOSCOPY (EGD) WITH PROPOFOL;  Surgeon: Lavena Bullion, DO;  Location: [place];  Service: Gastroenterology;  Laterality: N/A;  . EYE SURGERY    . GIVENS CAPSULE STUDY N/A 10/09/2018   Procedure: GIVENS CAPSULE STUDY;  Surgeon: Carol Ada, MD;  Location: [place];  Service: Endoscopy;  Laterality: N/A;  . IR THORACENTESIS ASP PLEURAL SPACE W/IMG GUIDE  10/09/2018  . PACEMAKER INSERTION  Nov 2000   St Jude with revision in 2011  . PERCUTANEOUS NEPHROLITHOTOMY  April 2012  . RIGHT HEART CATH N/A 06/28/2018   Procedure: RIGHT HEART CATH;  Surgeon: Jolaine Artist, MD;  Location: [place] [facility];  Service: Cardiovascular;  Laterality: N/A;  . TEE WITHOUT CARDIOVERSION N/A 06/28/2018   Procedure: TRANSESOPHAGEAL ECHOCARDIOGRAM (TEE);  Surgeon: Jolaine Artist, MD;  [facility] ENDOSCOPY;  Service: Cardiovascular;  Laterality: N/A;    Family History  Problem Relation Age of Onset  . Heart attack Father   . Stroke Mother   . Diabetes Sister   . Colon cancer Other        seven family members  . Liver disease Neg Hx   . Other Neg Hx     Social History   Tobacco Use  . Smoking status: Former Smoker    Packs/day: 1.00    Types: Cigarettes    Last attempt to quit: 09/26/1979    Years since quitting: 39.2  . Smokeless tobacco: Never Used  Substance Use Topics  . Alcohol use: No  . Drug use: No    Prior to Admission  medications   Medication Sig Start Date End Date Taking? Authorizing Provider  allopurinol (ZYLOPRIM) 300 MG tablet Take 300 mg by mouth every evening.    Yes [provider]  aspirin EC 325 MG tablet Take 325 mg by mouth every other day.    Yes [provider]  calcitRIOL (ROCALTROL) 0.25 MCG capsule Take 0.25 mcg by mouth every other day.    Yes [provider]  COD LIVER OIL PO Take 1 capsule by mouth daily.    Yes [provider]  docusate sodium (COLACE) 100 MG capsule Take 100 mg by mouth daily as needed for mild constipation.  Yes [provider]  ferrous sulfate 325 (65 FE) MG tablet Take 325 mg by mouth at bedtime.    Yes [provider]  HYDROcodone-acetaminophen (NORCO) 10-325 MG tablet Take 1 tablet by mouth See admin instructions. Take 1 tablet by mouth two times a day and an additional 0.5 tablet two times a day as needed for pain 11/15/18  Yes Gherghe, Vella Redhead, MD  isosorbide mononitrate (IMDUR) 30 MG 24 hr tablet Take 0.5 tablets (15 mg total) by mouth daily. Patient taking differently: Take 15 mg by mouth at bedtime.  12/10/17  Yes Fay Records, MD  metolazone (ZAROXOLYN) 2.5 MG tablet Take 2.5 mg by mouth See admin instructions. Take 1 tablet by mouth every Monday, Wednesday, Friday, Saturday.   Yes [provider]  polyethylene glycol (MIRALAX / GLYCOLAX) packet Take 17 g by mouth at bedtime.   Yes [provider]  potassium chloride SA (K-DUR,KLOR-CON) 20 MEQ tablet Take 1 tablet (20 mEq total) by mouth 3 (three) times a week. Taking 31mq twice daily = 421m on Monday, Wednesday, Friday. Patient taking differently: Take 20-40 mEq by mouth See admin instructions. Take 2 tablets in the morning and 1 tablet by mouth in the evening on Monday, Wednesday, Friday, Saturday 12/11/18  Yes Lama, GaMarge DuncansMD  torsemide (DEMADEX) 20 MG tablet Take 4 tablets (80 mg total) by mouth 2 (two) times daily. May also take 1  tablet (20 mg total) as needed (for weight greater than 246). Patient taking differently: Take 80 mg by mouth two times a day and an additional 20 mg as needed for a weight gain >246 07/15/18  Yes Clegg, Amy D, NP  vitamin B-12 (CYANOCOBALAMIN) 500 MCG tablet Take 500 mcg by mouth daily.   Yes [provider]  feeding supplement (BOOST HIGH PROTEIN) LIQD Take 1 Container by mouth 2 (two) times daily.     [provider]  methocarbamol (ROBAXIN) 500 MG tablet Take 1 tablet (500 mg total) by mouth every 6 (six) hours as needed for muscle spasms. Patient not taking: Reported on 12/31/2018 10/18/18   AdShelly CossMD  Misc. Devices MISC 1 each by Does not apply route at bedtime. C-PAP    [provider]  pantoprazole (PROTONIX) 40 MG tablet Take 1 tablet (40 mg total) by mouth 2 (two) times daily. Patient not taking: Reported on 12/31/2018 10/02/18   LeMahala MenghiniPA-C    Current Facility-Administered Medications  Medication Dose Route Frequency Provider Last Rate Last Dose  . 0.9 %  sodium chloride infusion (Manually program via Guardrails IV Fluids)   Intravenous Once ScGareth MorganMD      . 0.9 %  sodium chloride infusion (Manually program via Guardrails IV Fluids)   Intravenous Once Opyd, Timothy S, MD      . 0.9 %  sodium chloride infusion  250 mL Intravenous PRN Opyd, TiIlene QuaMD      . fentaNYL (SUBLIMAZE) injection 12.5-25 mcg  12.5-25 mcg Intravenous Q2H PRN Opyd, TiIlene QuaMD      . ondansetron (ZOFRAN) tablet 4 mg  4 mg Oral Q6H PRN Opyd, TiIlene QuaMD       Or  . ondansetron (ZOFRAN) injection 4 mg  4 mg Intravenous Q6H PRN Opyd, TiIlene QuaMD      . pantoprazole (PROTONIX) injection 40 mg  40 mg Intravenous Q12H Opyd, Timothy S, MD      . sodium chloride flush (NS) 0.9 % injection 3  mL  3 mL Intravenous Q12H Opyd, Ilene Qua, MD      . sodium chloride flush (NS) 0.9 % injection 3 mL  3 mL Intravenous Q12H Opyd, Timothy S, MD      . sodium chloride flush  (NS) 0.9 % injection 3 mL  3 mL Intravenous PRN Opyd, Ilene Qua, MD       Current Outpatient Medications  Medication Sig Dispense Refill  . allopurinol (ZYLOPRIM) 300 MG tablet Take 300 mg by mouth every evening.     Marland Kitchen aspirin EC 325 MG tablet Take 325 mg by mouth every other day.     . calcitRIOL (ROCALTROL) 0.25 MCG capsule Take 0.25 mcg by mouth every other day.     . COD LIVER OIL PO Take 1 capsule by mouth daily.     Marland Kitchen docusate sodium (COLACE) 100 MG capsule Take 100 mg by mouth daily as needed for mild constipation.     . ferrous sulfate 325 (65 FE) MG tablet Take 325 mg by mouth at bedtime.     Marland Kitchen HYDROcodone-acetaminophen (NORCO) 10-325 MG tablet Take 1 tablet by mouth See admin instructions. Take 1 tablet by mouth two times a day and an additional 0.5 tablet two times a day as needed for pain 30 tablet 0  . isosorbide mononitrate (IMDUR) 30 MG 24 hr tablet Take 0.5 tablets (15 mg total) by mouth daily. (Patient taking differently: Take 15 mg by mouth at bedtime. ) 90 tablet 2  . metolazone (ZAROXOLYN) 2.5 MG tablet Take 2.5 mg by mouth See admin instructions. Take 1 tablet by mouth every Monday, Wednesday, Friday, Saturday.    . polyethylene glycol (MIRALAX / GLYCOLAX) packet Take 17 g by mouth at bedtime.    . potassium chloride SA (K-DUR,KLOR-CON) 20 MEQ tablet Take 1 tablet (20 mEq total) by mouth 3 (three) times a week. Taking 26mq twice daily = 464m on Monday, Wednesday, Friday. (Patient taking differently: Take 20-40 mEq by mouth See admin instructions. Take 2 tablets in the morning and 1 tablet by mouth in the evening on Monday, Wednesday, Friday, Saturday) 60 tablet 2  . torsemide (DEMADEX) 20 MG tablet Take 4 tablets (80 mg total) by mouth 2 (two) times daily. May also take 1 tablet (20 mg total) as needed (for weight greater than 246). (Patient taking differently: Take 80 mg by mouth two times a day and an additional 20 mg as needed for a weight gain >246) 270 tablet 7  . vitamin  B-12 (CYANOCOBALAMIN) 500 MCG tablet Take 500 mcg by mouth daily.    . feeding supplement (BOOST HIGH PROTEIN) LIQD Take 1 Container by mouth 2 (two) times daily.     . methocarbamol (ROBAXIN) 500 MG tablet Take 1 tablet (500 mg total) by mouth every 6 (six) hours as needed for muscle spasms. (Patient not taking: Reported on 12/31/2018) 30 tablet 0  . Misc. Devices MISC 1 each by Does not apply route at bedtime. C-PAP    . pantoprazole (PROTONIX) 40 MG tablet Take 1 tablet (40 mg total) by mouth 2 (two) times daily. (Patient not taking: Reported on 12/31/2018) 60 tablet 5    Allergies as of 12/31/2018 - Review Complete 12/31/2018  Allergen Reaction Noted  . Beta adrenergic blockers Anaphylaxis and Other (See Comments)   . Cephalexin Shortness Of Breath 06/13/2016  . Codeine Anaphylaxis and Swelling   . Contrast media [iodinated diagnostic agents] Other (See Comments) 10/30/2014  . Coreg [carvedilol] Other (See Comments) 11/12/2014  .  Peppermint flavor Shortness Of Breath 11/21/2016  . Prednisone Anaphylaxis, Shortness Of Breath, and Swelling   . Tape Other (See Comments) 03/06/2013  . Ciprofloxacin Hives   . Latex Itching and Rash   . Penicillins Hives   . Iron Other (See Comments) 12/05/2018  . Diamox [acetazolamide] Rash 07/15/2018     Review of Systems:    This is positive for those things mentioned in the HPI, also positive for back pain chills All other review of systems are negative.       Physical Exam:  Vital signs in last 24 hours: Temp:  [98.5 F (36.9 C)] 98.5 F (36.9 C) (04/07 1715) Pulse Rate:  [90-98] 96 (04/07 2030) Resp:  [16-23] 19 (04/07 2030) BP: (84-125)/(34-52) 86/37 (04/07 2030) SpO2:  [96 %-100 %] 100 % (04/07 2030)    General:  Morbidly obese chronically ill pale elderly woman lying in bed Eyes:  anicteric. ENT:   Mouth and posterior pharynx free of lesions.  No angiodysplasia Lungs: Clear to auscultation bilaterally there is a bandage, on the right  upper flank area lower lung field. Heart:   S1S2, no rubs, 2/6 systolic ejection murmur heard best at the right upper sternal border, gallops. Abdomen: Morbidly obese soft, non-tender, no hepatosplenomegaly detected though obesity precludes good exam, she has a small reducible umbilical hernia no flank hemorrhages Rectal: Not done has heme positive stool noted already Extremities:   Severe bilateral lower extremity edema with brown hyperpigmentation and superficial ulcerations in the pretibial area  skin    rash.  As per extremity exam pale otherwise Neuro:  A&O x 3.  Psych:  appropriate mood and  Affect.   Data Reviewed:   LAB RESULTS: Recent Labs    12/31/18 1732  WBC 6.0  HGB 5.3*  HCT 18.0*  PLT 173   BMET Recent Labs    12/31/18 1732  NA 127*  K 3.4*  CL 86*  CO2 27  GLUCOSE 162*  BUN 161*  CREATININE 3.57*  CALCIUM 8.6*   LFT Recent Labs    12/31/18 1732  PROT 5.5*  ALBUMIN 2.8*  AST 21  ALT 14  ALKPHOS 59  BILITOT 0.6   PT/INR No results for input(s): LABPROT, INR in the last 72 hours.  Lab Results  Component Value Date   FERRITIN 40 12/31/2018    PREVIOUS ENDOSCOPIES:            See HPI   Thanks   LOS: 0 days   @  Simonne Maffucci, MD, [facility] @  12/31/2018, 9:05 PM

## 2018-12-31 NOTE — ED Notes (Signed)
[name] [name], daughter [phone]

## 2018-12-31 NOTE — ED Notes (Addendum)
ED TO INPATIENT HANDOFF REPORT  ED Nurse Name and Phone #:  Lonn Georgia [phone]  S Name/Age/Gender [name] [name] 73 y.o. female Room/Bed: 027C/027C  Code Status   Code Status: DNR  Home/SNF/Other Home Patient oriented to: self, place, time and situation Is this baseline? Yes   Triage Complete: Triage complete  Chief Complaint Low Hemoglobin  Triage Note Pt sent by kidney doctor for hemoglobin of 5.4. Hx of same. Denies obvious blood in stool, no shortness of breath, chest pain. No blood thinner use.    Allergies Allergies  Allergen Reactions  . Beta Adrenergic Blockers Anaphylaxis and Other (See Comments)    "DEATH" (Bradycardia and her organs shut down)  . Cephalexin Shortness Of Breath  . Codeine Anaphylaxis and Swelling    Throat swelling  . Contrast Media [Iodinated Diagnostic Agents] Other (See Comments)    Patient states "I have chronic kidney disease, so the doctor said no dye in my veins."  . Coreg [Carvedilol] Other (See Comments)    Beta Blockers cause her organs to shut down (per patient)  . Peppermint Flavor Shortness Of Breath  . Prednisone Anaphylaxis, Shortness Of Breath and Swelling    Throat swells   . Tape Other (See Comments)    States plastic tape blisters her skin  . Ciprofloxacin Hives    Tolerating levofloxin   . Latex Itching and Rash  . Penicillins Hives    DID THE REACTION INVOLVE: Swelling of the face/tongue/throat, SOB, or low BP? Yes Sudden or severe rash/hives, skin peeling, or the inside of the mouth or nose? Yes Did it require medical treatment? Pt was in hospital at the time of reaction When did it last happen?Was in her mid-40's If all above answers are "NO", may proceed with cephalosporin use.  . Iron Other (See Comments)    Per Patient- Felt like skin was crawling, foaming at mouth, and felt like she was going "crazy"  . Diamox [Acetazolamide] Rash    Rash after 2 doses of diamox     Level of Care/Admitting Diagnosis ED  Disposition    ED Disposition Condition [facility] Area: [place] [[id number]]  Level of Care: Progressive [102]  I expect the patient will be discharged within 24 hours: No (not a candidate for 5C-Observation unit)  Diagnosis: Symptomatic anemia [2423536]  Admitting Physician: Vianne Bulls [[id number]]  Attending Physician: Vianne Bulls [[id number]]  PT Class (Do Not Modify): Observation [104]  PT Acc Code (Do Not Modify): Observation [10022]       B Medical/Surgery History Past Medical History:  Diagnosis Date  . Anemia   . Atrial fibrillation ([place])   . Cataract   . Cervical cancer ([place]) 1991   s/p hysterectomy  . Chronic cellulitis   . Chronic combined systolic and diastolic congestive heart failure, NYHA class 2 (HCC)    LVEF 25-30% with restrictive diastolic filling  . Degenerative joint disease   . Diverticulitis   . Essential hypertension   . GERD (gastroesophageal reflux disease)   . Gout   . Kidney stones   . Morbid obesity ([place])   . NICM (nonischemic cardiomyopathy) ([place]) 02/22/2017  . Osteoarthritis   . Permanent atrial fibrillation 05/04/2009   Qualifier: History of  By: Quentin Cornwall CMA, Janett Billow    . 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 Lovena Le  . Sleep apnea    CPAP   Past Surgical History:  Procedure Laterality Date  . ABDOMINAL HYSTERECTOMY  1991  . BIOPSY  09/26/2018   Procedure: BIOPSY;  Surgeon: Lavena Bullion, DO;  Location: [place];  Service: Gastroenterology;;  . CHEST TUBE INSERTION Right 11/11/2018   Procedure: INSERTION PLEURAL DRAINAGE CATHETER;  Surgeon: Ivin Poot, MD;  Location: [place];  Service: Thoracic;  Laterality: Right;  . COLONOSCOPY  1998   one polyp per patient  . ESOPHAGOGASTRODUODENOSCOPY (EGD) WITH PROPOFOL N/A 09/26/2018   Procedure:  ESOPHAGOGASTRODUODENOSCOPY (EGD) WITH PROPOFOL;  Surgeon: Lavena Bullion, DO;  Location: [place];  Service: Gastroenterology;  Laterality: N/A;  . EYE SURGERY    . GIVENS CAPSULE STUDY N/A 10/09/2018   Procedure: GIVENS CAPSULE STUDY;  Surgeon: Carol Ada, MD;  Location: [place];  Service: Endoscopy;  Laterality: N/A;  . IR THORACENTESIS ASP PLEURAL SPACE W/IMG GUIDE  10/09/2018  . PACEMAKER INSERTION  Nov 2000   St Jude with revision in 2011  . PERCUTANEOUS NEPHROLITHOTOMY  April 2012  . RIGHT HEART CATH N/A 06/28/2018   Procedure: RIGHT HEART CATH;  Surgeon: Jolaine Artist, MD;  Location: [place] CV LAB;  Service: Cardiovascular;  Laterality: N/A;  . TEE WITHOUT CARDIOVERSION N/A 06/28/2018   Procedure: TRANSESOPHAGEAL ECHOCARDIOGRAM (TEE);  Surgeon: Jolaine Artist, MD;  Location[facility] ENDOSCOPY;  Service: Cardiovascular;  Laterality: N/A;     A IV Location/Drains/Wounds Patient Lines/Drains/Airways Status   Active Line/Drains/Airways    Name:   Placement date:   Placement time:   Site:   Days:   Peripheral IV 12/31/18 Right Arm   12/31/18    1848    Arm   less than 1   Peripheral IV 12/31/18 Left Hand   12/31/18    1950    Hand   less than 1   Closed System Drain Lateral;Right RUQ Other (Comment)   11/11/18    1541    RUQ   50   Incision (Closed) 11/11/18 Chest Right   11/11/18    0907     50   Wound / Incision (Open or Dehisced) 09/25/18 Other (Comment) Thigh Left;Posterior;Mid skin chafing    09/25/18    1900    Thigh   97   Wound / Incision (Open or Dehisced) 12/08/18 Other (Comment) Leg Right;Left Serous blister on BLE   12/08/18    2322    Leg   23          Intake/Output Last 24 hours No intake or output data in the 24 hours ending 12/31/18 2101  Labs/Imaging Results for orders placed or performed during the hospital encounter of 12/31/18 (from the past 48 hour(s))  Comprehensive metabolic panel     Status: Abnormal   Collection Time: 12/31/18   5:32 PM  Result Value Ref Range   Sodium 127 (L) 135 - 145 mmol/L   Potassium 3.4 (L) 3.5 - 5.1 mmol/L   Chloride 86 (L) 98 - 111 mmol/L   CO2 27 22 - 32 mmol/L   Glucose, Bld 162 (H) 70 - 99 mg/dL   BUN 161 (H) 8 - 23 mg/dL   Creatinine, Ser 3.57 (H) 0.44 - 1.00 mg/dL   Calcium 8.6 (L) 8.9 - 10.3 mg/dL   Total Protein 5.5 (L) 6.5 - 8.1 g/dL   Albumin 2.8 (L) 3.5 - 5.0 g/dL   AST 21 15 - 41 U/L   ALT 14  0 - 44 U/L   Alkaline Phosphatase 59 38 - 126 U/L   Total Bilirubin 0.6 0.3 - 1.2 mg/dL   GFR calc non Af Amer 12 (L) >60 mL/min   GFR calc Af Amer 14 (L) >60 mL/min   Anion gap 14 5 - 15    Comment: Performed at [facility] [address], [place] [address]  CBC     Status: Abnormal   Collection Time: 12/31/18  5:32 PM  Result Value Ref Range   WBC 6.0 4.0 - 10.5 K/uL   RBC 1.93 (L) 3.87 - 5.11 MIL/uL   Hemoglobin 5.3 (LL) 12.0 - 15.0 g/dL    Comment: REPEATED TO VERIFY THIS CRITICAL RESULT HAS VERIFIED AND BEEN CALLED TO K.Oliver Heitzenrater RN BY KATHERINE MCCORMICK ON 04 07 2020 AT 1803, AND HAS BEEN READ BACK.     HCT 18.0 (L) 36.0 - 46.0 %   MCV 93.3 80.0 - 100.0 fL   MCH 27.5 26.0 - 34.0 pg   MCHC 29.4 (L) 30.0 - 36.0 g/dL   RDW 16.3 (H) 11.5 - 15.5 %   Platelets 173 150 - 400 K/uL   nRBC 0.0 0.0 - 0.2 %    Comment: Performed at [facility] [address] [place], [place] [address]  Type and screen [place]     Status: None   Collection Time: 12/31/18  5:32 PM  Result Value Ref Range   ABO/RH(D) O POS    Antibody Screen POS    Sample Expiration 01/03/2019    Antibody Identification      ANTI e Performed at [facility], [place] [address] [place], [place] [address]   Vitamin B12     Status: Abnormal   Collection Time: 12/31/18  6:28 PM  Result Value Ref Range   Vitamin B-12 3,640 (H) 180 - 914 pg/mL    Comment: (NOTE) This assay is not validated for testing neonatal or myeloproliferative syndrome specimens for Vitamin B12  levels. Performed at [facility], [place] [address] [place], [place] [address]   Folate     Status: None   Collection Time: 12/31/18  6:28 PM  Result Value Ref Range   Folate 38.1 >5.9 ng/mL    Comment: Performed at [facility], [place] [address], [place], [place] [address]  Iron and TIBC     Status: Abnormal   Collection Time: 12/31/18  6:28 PM  Result Value Ref Range   Iron 13 (L) 28 - 170 ug/dL   TIBC 521 (H) 250 - 450 ug/dL   Saturation Ratios 2 (L) 10.4 - 31.8 %   UIBC 508 ug/dL    Comment: Performed at [facility], [place] [address] [place],  [address]  Ferritin     Status: None   Collection Time: 12/31/18  6:28 PM  Result Value Ref Range   Ferritin 40 11 - 307 ng/mL    Comment: Performed at [facility], [place] [address] [place], [place] [address]  Reticulocytes     Status: Abnormal   Collection Time: 12/31/18  6:28 PM  Result Value Ref Range   Retic Ct Pct 6.3 (H) 0.4 - 3.1 %   RBC. 2.01 (L) 3.87 - 5.11 MIL/uL   Retic Count, Absolute 127.2 19.0 - 186.0 K/uL   Immature Retic Fract 18.4 (H) 2.3 - 15.9 %    Comment: Performed at [facility]  [address], [place], [place] [address]  Magnesium     Status: Abnormal   Collection Time: 12/31/18  6:28 PM  Result Value Ref Range   Magnesium 3.2 (H) 1.7 - 2.4 mg/dL    Comment: Performed at [facility] [address] [place], [place] [address]  Differential     Status: Abnormal   Collection Time: 12/31/18  6:28 PM  Result Value Ref Range   Neutrophils Relative % 81 %   Neutro Abs 6.1 1.7 - 7.7 K/uL   Lymphocytes Relative 9 %   Lymphs Abs 0.7 0.7 - 4.0 K/uL   Monocytes Relative 7 %   Monocytes Absolute 0.6 0.1 - 1.0 K/uL   Eosinophils Relative 2 %   Eosinophils Absolute 0.1 0.0 - 0.5 K/uL   Basophils Relative 0 %   Basophils Absolute 0.0 0.0 - 0.1 K/uL   Immature Granulocytes 1 %   Abs Immature Granulocytes 0.08 (H) 0.00 - 0.07 K/uL    Comment: Performed at [facility] [address], [place], [place] [address]  Prepare RBC     Status: None   Collection Time: 12/31/18  6:43 PM  Result Value Ref Range   Order Confirmation      ORDER PROCESSED BY BLOOD BANK Performed at [facility], [place] [address], [place], [place] [address]   POC occult blood, ED     Status: Abnormal   Collection Time: 12/31/18  8:01 PM  Result Value Ref Range   Fecal Occult Bld POSITIVE (A) NEGATIVE  Urinalysis, Complete w Microscopic     Status: Abnormal   Collection Time: 12/31/18  8:15 PM  Result Value Ref Range   Color, Urine STRAW (A) YELLOW   APPearance CLEAR CLEAR   Specific Gravity, Urine 1.009 1.005 - 1.030   pH 6.0 5.0 - 8.0   Glucose, UA NEGATIVE NEGATIVE mg/dL   Hgb urine dipstick SMALL (A) NEGATIVE   Bilirubin Urine NEGATIVE NEGATIVE   Ketones, ur NEGATIVE NEGATIVE mg/dL   Protein, ur NEGATIVE NEGATIVE mg/dL   Nitrite NEGATIVE NEGATIVE   Leukocytes,Ua MODERATE (A) NEGATIVE   RBC / HPF 0-5 0 - 5 RBC/hpf   WBC, UA 6-10 0 - 5 WBC/hpf   Bacteria, UA RARE (A) NONE SEEN   Squamous Epithelial / LPF 0-5 0 - 5    Comment: Performed at [facility], [address], [place], [place] [address]   No results found.  Pending Labs Unresulted Labs (From admission, onward)    Start     Ordered   01/01/19 1761  Basic metabolic panel  Tomorrow morning,   R     12/31/18 2017   12/31/18 2052  Troponin I - ONCE - STAT  ONCE - STAT,   STAT     12/31/18 2051   12/31/18 2015  Sodium, urine, random  Once,   R     12/31/18 2017   12/31/18 2015  Osmolality, urine  Once,   R     12/31/18 2017   12/31/18 2015  Urea nitrogen, urine  Add-on,   R     12/31/18 2017   12/31/18 2015  Creatinine, urine, random  Add-on,   R     12/31/18 2017          Vitals/Pain Today's Vitals   12/31/18 1900 12/31/18 1956 12/31/18 2015 12/31/18 2030  BP: (!) 92/52 (!) 97/45 (!) 84/47 (!) 86/37  Pulse: 98 90 94 96  Resp:  Marland Kitchen)  23 18 19   Temp:      TempSrc:      SpO2: 96% 100% 96%  100%  PainSc:        Isolation Precautions No active isolations  Medications Medications  0.9 %  sodium chloride infusion (Manually program via Guardrails IV Fluids) (has no administration in time range)  0.9 %  sodium chloride infusion (Manually program via Guardrails IV Fluids) (has no administration in time range)  pantoprazole (PROTONIX) injection 40 mg (has no administration in time range)  fentaNYL (SUBLIMAZE) injection 12.5-25 mcg (has no administration in time range)  sodium chloride flush (NS) 0.9 % injection 3 mL (has no administration in time range)  sodium chloride flush (NS) 0.9 % injection 3 mL (has no administration in time range)  sodium chloride flush (NS) 0.9 % injection 3 mL (has no administration in time range)  0.9 %  sodium chloride infusion (has no administration in time range)  ondansetron (ZOFRAN) tablet 4 mg (has no administration in time range)    Or  ondansetron (ZOFRAN) injection 4 mg (has no administration in time range)  sodium chloride 0.9 % bolus 500 mL (500 mLs Intravenous New Bag/Given 12/31/18 2016)    Mobility manual wheelchair, walks with assistance Moderate fall risk   Focused Assessments    R Recommendations: See Admitting Provider Note  Report given to:   Additional Notes:  Blood products for this pt have to come from outside facility due to specific antibodies in blood.

## 2018-12-31 NOTE — ED Notes (Signed)
MD Opyd aware of bp maps in the 50's, pt mentating well, will given bolus and continue to monitor.

## 2018-12-31 NOTE — ED Notes (Addendum)
Pt refusing standing orthostatics, states she will not be able to get back on the bed and states her right thigh is cramping too much. Pt ambulatory with minimal assistance.

## 2018-12-31 NOTE — H&P (Signed)
History and Physical    [name] [name] WIO:[id number] DOB: [birth year] DOA: 12/31/2018  PCP: [name], MD   Patient coming from: SNF   Chief Complaint: Low hemoglobin   HPI: [name] [name] is a 73 y.o. female with medical history significant for  hypertension, chronic combined systolic and diastolic CHF, chronic kidney disease stage IV, and severe iron deficiency anemia, now presenting to the emergency department for evaluation of low hemoglobin.  Patient reports general malaise and fatigue that has developed insidiously in recent weeks, but denies any cough, shortness of breath, fevers, chills, headaches, or chest pain.  She reports that she was seen by an outpatient physician today, had blood work performed, and was directed to the ED for evaluation and management of a low hemoglobin, reportedly 5.4.  Patient has not noted any blood in her stool, denies abdominal pain, and denies vomiting.  ED Course: Upon arrival to the ED, patient is found to be afebrile, saturating well on room air, systolic blood pressure in the 80s to 90s, and vitals otherwise normal.  EKG features of paced rhythm.  Chemistry panel is notable for sodium of 127, BUN 161, and creatinine of 3.57, up from an apparent baseline of 2.6.  CBC is notable for hemoglobin of 5.3, down from an apparent baseline of roughly 8.  ED physician reports gross melena on exam.  Gastroenterology was consulted by the ED physician, 2 units of packed red blood cells were ordered, and the patient will be observed for ongoing evaluation and management of suspected upper GI bleeding with symptomatic anemia.  Review of Systems:  All other systems reviewed and apart from HPI, are negative.  Past Medical History:  Diagnosis Date  . Anemia   . Atrial fibrillation ([place])   . Cataract   . Cervical cancer ([place]) 1991   s/p hysterectomy  . Chronic cellulitis   . Chronic combined systolic and diastolic congestive heart failure, NYHA class 2 (HCC)     LVEF 25-30% with restrictive diastolic filling  . Degenerative joint disease   . Diverticulitis   . Essential hypertension   . GERD (gastroesophageal reflux disease)   . Gout   . Kidney stones   . Morbid obesity ([place])   . NICM (nonischemic cardiomyopathy) ([place]) 02/22/2017  . Osteoarthritis   . Permanent atrial fibrillation 05/04/2009   Qualifier: History of  By: Quentin Cornwall CMA, Janett Billow    . 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 Sinoatrial node dysfunction [facility])    Status post PPM - Dr. Lovena Le  . Sleep apnea    CPAP    Past Surgical History:  Procedure Laterality Date  . ABDOMINAL HYSTERECTOMY  1991  . BIOPSY  09/26/2018   Procedure: BIOPSY;  Surgeon: Lavena Bullion, DO;  Location: [place];  Service: Gastroenterology;;  . CHEST TUBE INSERTION Right 11/11/2018   Procedure: INSERTION PLEURAL DRAINAGE CATHETER;  Surgeon: Ivin Poot, MD;  Location: [place];  Service: Thoracic;  Laterality: Right;  . COLONOSCOPY  1998   one polyp per patient  . ESOPHAGOGASTRODUODENOSCOPY (EGD) WITH PROPOFOL N/A 09/26/2018   Procedure: ESOPHAGOGASTRODUODENOSCOPY (EGD) WITH PROPOFOL;  Surgeon: Lavena Bullion, DO;  Location: [place];  Service: Gastroenterology;  Laterality: N/A;  . EYE SURGERY    . GIVENS CAPSULE STUDY N/A 10/09/2018   Procedure: GIVENS CAPSULE STUDY;  Surgeon: Carol Ada, MD;  Location: [place];  Service: Endoscopy;  Laterality: N/A;  .  IR THORACENTESIS ASP PLEURAL SPACE W/IMG GUIDE  10/09/2018  . PACEMAKER INSERTION  Nov 2000   St Jude with revision in 2011  . PERCUTANEOUS NEPHROLITHOTOMY  April 2012  . RIGHT HEART CATH N/A 06/28/2018   Procedure: RIGHT HEART CATH;  Surgeon: Jolaine Artist, MD;  Location: [facility];  Service: Cardiovascular;  Laterality: N/A;  . TEE WITHOUT CARDIOVERSION N/A 06/28/2018   Procedure: TRANSESOPHAGEAL ECHOCARDIOGRAM (TEE);  Surgeon: Jolaine Artist, MD;  Location: [facility] ENDOSCOPY;  Service: Cardiovascular;  Laterality: N/A;     reports that she quit smoking about 39 years ago. Her smoking use included cigarettes. She smoked 1.00 pack per day. She has never used smokeless tobacco. She reports that she does not drink alcohol or use drugs.  Allergies  Allergen Reactions  . Beta Adrenergic Blockers Anaphylaxis and Other (See Comments)    "DEATH" (Bradycardia and her organs shut down)  . Cephalexin Shortness Of Breath  . Codeine Anaphylaxis and Swelling    Throat swelling  . Contrast Media [Iodinated Diagnostic Agents] Other (See Comments)    Patient states "I have chronic kidney disease, so the doctor said no dye in my veins."  . Coreg [Carvedilol] Other (See Comments)    Beta Blockers cause her organs to shut down (per patient)  . Peppermint Flavor Shortness Of Breath  . Prednisone Anaphylaxis, Shortness Of Breath and Swelling    Throat swells   . Tape Other (See Comments)    States plastic tape blisters her skin  . Ciprofloxacin Hives    Tolerating levofloxin   . Latex Itching and Rash  . Penicillins Hives    DID THE REACTION INVOLVE: Swelling of the face/tongue/throat, SOB, or low BP? Yes Sudden or severe rash/hives, skin peeling, or the inside of the mouth or nose? Yes Did it require medical treatment? Pt was in hospital at the time of reaction When did it last happen?Was in her mid-40's If all above answers are "NO", may proceed with cephalosporin use.  . Iron Other (See Comments)    Per Patient- Felt like skin was crawling, foaming at mouth, and felt like she was going "crazy"  . Diamox [Acetazolamide] Rash    Rash after 2 doses of diamox     Family History  Problem Relation Age of Onset  . Heart attack Father   . Stroke Mother   . Diabetes Sister   . Colon cancer Other        seven family members  . Liver disease Neg Hx   . Other Neg Hx      Prior to Admission medications   Medication Sig Start Date  End Date Taking? Authorizing Provider  allopurinol (ZYLOPRIM) 300 MG tablet Take 300 mg by mouth every evening.    Yes [provider]  aspirin EC 325 MG tablet Take 325 mg by mouth every other day.    Yes [provider]  calcitRIOL (ROCALTROL) 0.25 MCG capsule Take 0.25 mcg by mouth every other day.    Yes [provider]  COD LIVER OIL PO Take 1 capsule by mouth daily.    Yes [provider]  docusate sodium (COLACE) 100 MG capsule Take 100 mg by mouth daily as needed for mild constipation.    Yes [provider]  ferrous sulfate 325 (65 FE) MG tablet Take 325 mg by mouth at bedtime.    Yes [provider]  HYDROcodone-acetaminophen (NORCO) 10-325 MG tablet Take 1 tablet by mouth See  admin instructions. Take 1 tablet by mouth two times a day and an additional 0.5 tablet two times a day as needed for pain 11/15/18  Yes Gherghe, Vella Redhead, MD  isosorbide mononitrate (IMDUR) 30 MG 24 hr tablet Take 0.5 tablets (15 mg total) by mouth daily. Patient taking differently: Take 15 mg by mouth at bedtime.  12/10/17  Yes Fay Records, MD  metolazone (ZAROXOLYN) 2.5 MG tablet Take 2.5 mg by mouth See admin instructions. Take 1 tablet by mouth every Monday, Wednesday, Friday, Saturday.   Yes [provider]  polyethylene glycol (MIRALAX / GLYCOLAX) packet Take 17 g by mouth at bedtime.   Yes [provider]  potassium chloride SA (K-DUR,KLOR-CON) 20 MEQ tablet Take 1 tablet (20 mEq total) by mouth 3 (three) times a week. Taking 64mq twice daily = 458m on Monday, Wednesday, Friday. Patient taking differently: Take 20-40 mEq by mouth See admin instructions. Take 2 tablets in the morning and 1 tablet by mouth in the evening on Monday, Wednesday, Friday, Saturday 12/11/18  Yes Lama, GaMarge DuncansMD  torsemide (DEMADEX) 20 MG tablet Take 4 tablets (80 mg total) by mouth 2 (two) times daily. May also take 1 tablet (20 mg total) as needed (for weight  greater than 246). Patient taking differently: Take 80 mg by mouth two times a day and an additional 20 mg as needed for a weight gain >246 07/15/18  Yes Clegg, Amy D, NP  vitamin B-12 (CYANOCOBALAMIN) 500 MCG tablet Take 500 mcg by mouth daily.   Yes [provider]  feeding supplement (BOOST HIGH PROTEIN) LIQD Take 1 Container by mouth 2 (two) times daily.     [provider]  methocarbamol (ROBAXIN) 500 MG tablet Take 1 tablet (500 mg total) by mouth every 6 (six) hours as needed for muscle spasms. Patient not taking: Reported on 12/31/2018 10/18/18   AdShelly CossMD  Misc. Devices MISC 1 each by Does not apply route at bedtime. C-PAP    [provider]  pantoprazole (PROTONIX) 40 MG tablet Take 1 tablet (40 mg total) by mouth 2 (two) times daily. Patient not taking: Reported on 12/31/2018 10/02/18   LeMahala MenghiniPA-C    Physical Exam: Vitals:   12/31/18 1845 12/31/18 1900 12/31/18 1956 12/31/18 2015  BP:  (!) 92/52 (!) 97/45 (!) 84/47  Pulse: 90 98 90 94  Resp:   (!) 23 18  Temp:      TempSrc:      SpO2: 97% 96% 100% 96%    Constitutional: NAD, calm  Eyes: PERTLA, lids and conjunctivae normal ENMT: Mucous membranes are moist. Posterior pharynx clear of any exudate or lesions.   Neck: normal, supple, no masses, no thyromegaly Respiratory: no wheezing, no crackles. Normal respiratory effort.   Cardiovascular: S1 & S2 heard, regular rate and rhythm. Pretibial pitting edema bilaterally. Abdomen: No distension, no tenderness, soft. Bowel sounds active.  Musculoskeletal: no clubbing / cyanosis. No joint deformity upper and lower extremities.   Skin: Hyperpigmentation in gaiter distribution bilaterally with superficial and non-draining ulcerations on the left. Warm, dry, well-perfused. Neurologic: No facial asymmetry. Sensation intact. Moving all extremities.  Psychiatric: Alert and oriented to person, place, and situation. Pleasant and cooperative.    Labs  on Admission: I have personally reviewed following labs and imaging studies  CBC: Recent Labs  Lab 12/31/18 1732 12/31/18 1828  WBC 6.0  --   NEUTROABS  --  6.1  HGB 5.3*  --  HCT 18.0*  --   MCV 93.3  --   PLT 173  --    Basic Metabolic Panel: Recent Labs  Lab 12/31/18 1732 12/31/18 1828  NA 127*  --   K 3.4*  --   CL 86*  --   CO2 27  --   GLUCOSE 162*  --   BUN 161*  --   CREATININE 3.57*  --   CALCIUM 8.6*  --   MG  --  3.2*   GFR: CrCl cannot be calculated (Unknown ideal weight.). Liver Function Tests: Recent Labs  Lab 12/31/18 1732  AST 21  ALT 14  ALKPHOS 59  BILITOT 0.6  PROT 5.5*  ALBUMIN 2.8*   No results for input(s): LIPASE, AMYLASE in the last 168 hours. No results for input(s): AMMONIA in the last 168 hours. Coagulation Profile: No results for input(s): INR, PROTIME in the last 168 hours. Cardiac Enzymes: No results for input(s): CKTOTAL, CKMB, CKMBINDEX, TROPONINI in the last 168 hours. BNP (last 3 results) No results for input(s): PROBNP in the last 8760 hours. HbA1C: No results for input(s): HGBA1C in the last 72 hours. CBG: No results for input(s): GLUCAP in the last 168 hours. Lipid Profile: No results for input(s): CHOL, HDL, LDLCALC, TRIG, CHOLHDL, LDLDIRECT in the last 72 hours. Thyroid Function Tests: No results for input(s): TSH, T4TOTAL, FREET4, T3FREE, THYROIDAB in the last 72 hours. Anemia Panel: Recent Labs    12/31/18 1828  RETICCTPCT 6.3*   Urine analysis:    Component Value Date/Time   COLORURINE YELLOW 11/09/2018 [place] 11/09/2018 1653   LABSPEC 1.009 11/09/2018 1653   PHURINE 7.0 11/09/2018 1653   GLUCOSEU NEGATIVE 11/09/2018 1653   HGBUR NEGATIVE 11/09/2018 1653   BILIRUBINUR NEGATIVE 11/09/2018 1653   KETONESUR NEGATIVE 11/09/2018 1653   PROTEINUR NEGATIVE 11/09/2018 1653   UROBILINOGEN 0.2 03/06/2013 2035   NITRITE NEGATIVE 11/09/2018 1653   LEUKOCYTESUR SMALL (A) 11/09/2018 1653    Sepsis Labs: @LABRCNTIP (procalcitonin:4,lacticidven:4) )No results found for this or any previous visit (from the past 240 hour(s)).   Radiological Exams on Admission: No results found.  EKG: Independently reviewed. Ventricular-paced rhythm.   Assessment/Plan  1. Upper GI bleeding; symptomatic anemia  - Presents with gen weakness and low Hgb on outpatient labs  - Found to have Hgb of 5.3 in ED, down from baseline of ~8  - Melena noted on DRE per ED physician  - She had upper endoscopy in January with antral erosive gastritis, capsule study in January with no source of bleeding identified  - 2 units RBC ordered from ED  - GI is consulting and much appreciated  - Keep NPO, continue Protonix with 40 mg IV q12h, check post-transfusion CBC   2. Acute kidney injury superimposed on CKD IV  - SCr is 3.57 on admission, up from an apparent baseline of ~2.6  - Check urinalysis and FEUrea, avoid nephrotoxins, renally-dose medications, and repeat chem panel in am   3. Chronic combined systolic & diastolic CHF  - Fluid-status difficult to determine clinically d/t body habitus; there is peripheral edema, low BP, elevated MWU:[id number] ratio (could be secondary to UGIB), no SOB  - She received a 500 cc NS bolus in ED d/t hypotension and 2 units of RBC are ordered for transfusion  - Follow daily wt and I/O's, consider Lasix with RBC's if BP allows    4. Hyponatremia  - Serum sodium is 127 on admission  - Fluid-status difficult to determine  clinically d/t body habitus  - She is receiving small NS bolus in ED d/t low BP  - Check urine sodium and urine osm, repeat chem panel in am     DVT prophylaxis: SCD's Code Status: DNR; confirmed with patient  Family Communication: Discussed with patient   Consults called: GI  Admission status: Observation     Vianne Bulls, MD [facility] Pager [phone]  If 7PM-7AM, please contact night-coverage [email or web address] Password [facility]  12/31/2018,  8:17 PM

## 2019-01-01 ENCOUNTER — Ambulatory Visit: Payer: Medicare Other | Admitting: Cardiothoracic Surgery

## 2019-01-01 DIAGNOSIS — D631 Anemia in chronic kidney disease: Secondary | ICD-10-CM | POA: Diagnosis present

## 2019-01-01 DIAGNOSIS — I081 Rheumatic disorders of both mitral and tricuspid valves: Secondary | ICD-10-CM | POA: Diagnosis present

## 2019-01-01 DIAGNOSIS — N184 Chronic kidney disease, stage 4 (severe): Secondary | ICD-10-CM

## 2019-01-01 DIAGNOSIS — Z515 Encounter for palliative care: Secondary | ICD-10-CM | POA: Diagnosis not present

## 2019-01-01 DIAGNOSIS — R531 Weakness: Secondary | ICD-10-CM | POA: Diagnosis not present

## 2019-01-01 DIAGNOSIS — I10 Essential (primary) hypertension: Secondary | ICD-10-CM | POA: Diagnosis not present

## 2019-01-01 DIAGNOSIS — I5043 Acute on chronic combined systolic (congestive) and diastolic (congestive) heart failure: Secondary | ICD-10-CM | POA: Diagnosis present

## 2019-01-01 DIAGNOSIS — K922 Gastrointestinal hemorrhage, unspecified: Secondary | ICD-10-CM | POA: Diagnosis not present

## 2019-01-01 DIAGNOSIS — Z66 Do not resuscitate: Secondary | ICD-10-CM | POA: Diagnosis present

## 2019-01-01 DIAGNOSIS — D649 Anemia, unspecified: Secondary | ICD-10-CM | POA: Diagnosis not present

## 2019-01-01 DIAGNOSIS — I428 Other cardiomyopathies: Secondary | ICD-10-CM | POA: Diagnosis present

## 2019-01-01 DIAGNOSIS — Z823 Family history of stroke: Secondary | ICD-10-CM | POA: Diagnosis not present

## 2019-01-01 DIAGNOSIS — Z833 Family history of diabetes mellitus: Secondary | ICD-10-CM | POA: Diagnosis not present

## 2019-01-01 DIAGNOSIS — E871 Hypo-osmolality and hyponatremia: Secondary | ICD-10-CM | POA: Diagnosis present

## 2019-01-01 DIAGNOSIS — Z6839 Body mass index (BMI) 39.0-39.9, adult: Secondary | ICD-10-CM | POA: Diagnosis not present

## 2019-01-01 DIAGNOSIS — G4733 Obstructive sleep apnea (adult) (pediatric): Secondary | ICD-10-CM | POA: Diagnosis present

## 2019-01-01 DIAGNOSIS — Z950 Presence of cardiac pacemaker: Secondary | ICD-10-CM | POA: Diagnosis not present

## 2019-01-01 DIAGNOSIS — D508 Other iron deficiency anemias: Secondary | ICD-10-CM

## 2019-01-01 DIAGNOSIS — K921 Melena: Secondary | ICD-10-CM | POA: Diagnosis present

## 2019-01-01 DIAGNOSIS — N170 Acute kidney failure with tubular necrosis: Secondary | ICD-10-CM | POA: Diagnosis not present

## 2019-01-01 DIAGNOSIS — K5900 Constipation, unspecified: Secondary | ICD-10-CM | POA: Diagnosis present

## 2019-01-01 DIAGNOSIS — E876 Hypokalemia: Secondary | ICD-10-CM | POA: Diagnosis present

## 2019-01-01 DIAGNOSIS — I4821 Permanent atrial fibrillation: Secondary | ICD-10-CM | POA: Diagnosis present

## 2019-01-01 DIAGNOSIS — Z87891 Personal history of nicotine dependence: Secondary | ICD-10-CM | POA: Diagnosis not present

## 2019-01-01 DIAGNOSIS — I959 Hypotension, unspecified: Secondary | ICD-10-CM | POA: Diagnosis present

## 2019-01-01 DIAGNOSIS — I130 Hypertensive heart and chronic kidney disease with heart failure and stage 1 through stage 4 chronic kidney disease, or unspecified chronic kidney disease: Secondary | ICD-10-CM | POA: Diagnosis present

## 2019-01-01 DIAGNOSIS — Z8249 Family history of ischemic heart disease and other diseases of the circulatory system: Secondary | ICD-10-CM | POA: Diagnosis not present

## 2019-01-01 DIAGNOSIS — D62 Acute posthemorrhagic anemia: Secondary | ICD-10-CM | POA: Diagnosis present

## 2019-01-01 DIAGNOSIS — I5042 Chronic combined systolic (congestive) and diastolic (congestive) heart failure: Secondary | ICD-10-CM | POA: Diagnosis not present

## 2019-01-01 DIAGNOSIS — N179 Acute kidney failure, unspecified: Secondary | ICD-10-CM | POA: Diagnosis present

## 2019-01-01 DIAGNOSIS — I272 Pulmonary hypertension, unspecified: Secondary | ICD-10-CM | POA: Diagnosis present

## 2019-01-01 LAB — TROPONIN I
Troponin I: 0.04 ng/mL (ref <0.03)
Troponin I: 0.04 ng/mL (ref <0.03)

## 2019-01-01 LAB — BASIC METABOLIC PANEL
Anion gap: 14 (ref 5–15)
Anion gap: 16 — ABNORMAL HIGH (ref 5–15)
BUN: 160 mg/dL — ABNORMAL HIGH (ref 8–23)
BUN: 158 mg/dL — ABNORMAL HIGH (ref 8–23)
CO2: 27 mmol/L (ref 22–32)
CO2: 26 mmol/L (ref 22–32)
Calcium: 8.5 mg/dL — ABNORMAL LOW (ref 8.9–10.3)
Calcium: 8.5 mg/dL — ABNORMAL LOW (ref 8.9–10.3)
Chloride: 89 mmol/L — ABNORMAL LOW (ref 98–111)
Chloride: 88 mmol/L — ABNORMAL LOW (ref 98–111)
Creatinine, Ser: 3.39 mg/dL — ABNORMAL HIGH (ref 0.44–1.00)
Creatinine, Ser: 3.34 mg/dL — ABNORMAL HIGH (ref 0.44–1.00)
GFR calc Af Amer: 15 mL/min — ABNORMAL LOW (ref >60)
GFR calc Af Amer: 15 mL/min — ABNORMAL LOW (ref >60)
GFR calc non Af Amer: 13 mL/min — ABNORMAL LOW (ref >60)
GFR calc non Af Amer: 13 mL/min — ABNORMAL LOW (ref >60)
Glucose, Bld: 134 mg/dL — ABNORMAL HIGH (ref 70–99)
Glucose, Bld: 176 mg/dL — ABNORMAL HIGH (ref 70–99)
Potassium: 2.7 mmol/L — CL (ref 3.5–5.1)
Potassium: 2.9 mmol/L — ABNORMAL LOW (ref 3.5–5.1)
Sodium: 130 mmol/L — ABNORMAL LOW (ref 135–145)
Sodium: 130 mmol/L — ABNORMAL LOW (ref 135–145)

## 2019-01-01 LAB — HEMOGLOBIN AND HEMATOCRIT, BLOOD
HCT: 22.3 % — ABNORMAL LOW (ref 36.0–46.0)
Hemoglobin: 6.8 g/dL — CL (ref 12.0–15.0)

## 2019-01-01 LAB — OSMOLALITY, URINE: Osmolality, Ur: 321 mOsm/kg (ref 300–900)

## 2019-01-01 LAB — PREPARE RBC (CROSSMATCH)

## 2019-01-01 MED ORDER — SODIUM CHLORIDE 0.9% IV SOLUTION: Freq: Once | INTRAVENOUS | Status: DC

## 2019-01-01 MED ORDER — POTASSIUM CHLORIDE CRYS ER 20 MEQ PO TBCR
40.0000 meq | EXTENDED_RELEASE_TABLET | Freq: Once | ORAL | Status: AC
  Administered 2019-01-01: 40 meq via ORAL
  Filled 2019-01-01: qty 2

## 2019-01-01 MED ORDER — FUROSEMIDE 10 MG/ML IJ SOLN
120.0000 mg | Freq: Two times a day (BID) | INTRAVENOUS | Status: DC
  Administered 2019-01-01 – 2019-01-04 (×7): 120 mg via INTRAVENOUS
  Filled 2019-01-01: qty 12
  Filled 2019-01-01 (×3): qty 10
  Filled 2019-01-01 (×2): qty 12
  Filled 2019-01-01 (×4): qty 10

## 2019-01-01 MED ORDER — SODIUM CHLORIDE 0.9% IV SOLUTION
Freq: Once | INTRAVENOUS | Status: AC
  Administered 2019-01-01: 15:00:00 via INTRAVENOUS

## 2019-01-01 MED ORDER — PANTOPRAZOLE SODIUM 40 MG PO TBEC
40.0000 mg | DELAYED_RELEASE_TABLET | Freq: Two times a day (BID) | ORAL | Status: DC
  Administered 2019-01-01 – 2019-01-05 (×9): 40 mg via ORAL
  Filled 2019-01-01 (×9): qty 1

## 2019-01-01 MED ORDER — POTASSIUM CHLORIDE CRYS ER 20 MEQ PO TBCR
40.0000 meq | EXTENDED_RELEASE_TABLET | Freq: Once | ORAL | Status: AC
  Administered 2019-01-01: 40 meq via ORAL
  Filled 2019-01-01 (×2): qty 2

## 2019-01-01 MED ORDER — MIDODRINE HCL 5 MG PO TABS
5.0000 mg | ORAL_TABLET | Freq: Three times a day (TID) | ORAL | Status: DC
  Administered 2019-01-01 – 2019-01-05 (×12): 5 mg via ORAL
  Filled 2019-01-01 (×12): qty 1

## 2019-01-01 NOTE — Progress Notes (Signed)
PROGRESS NOTE  [name] [name]  UXN:[id number] DOB: 1946/02/19 DOA: 12/31/2018 PCP: [name] Amel, MD  Outpatient Specialists: Nephrology, Dr. Jimmy Footman; Cardiology, Dr. Haroldine Laws; [facility] Narrative: [name] [name] is a 73 y.o. female with a history of chronic combined CHF, stage IV CKD, HTN, recurrent right pleural effusion, and iron deficiency anemia who presented to the ED due to low hemoglobin. She had been discharged after a cumulative of 4u given for GI bleeding with largely negative work up for GI bleeding 3/12 - 3/18. Her daughter reports the patient's mostly been sleeping and growing increasingly fatigued worsening gradually over the previous 1 1/2 weeks. When following up with nephrology, labs were done, showing hgb 5.4 and patient directed to ED. She was afebrile with soft BP improved with IVF bolus, Anemia confirmed at 5.3g/dl. 2u PRBCs ordered and obtained from [place], [place] due to antibodies. Hgb up to only 5.8 after first unit. Creatinine up to 3.57 from baseline near 2.6.  Assessment & Plan: Principal Problem:   Symptomatic anemia Active Problems:   Essential hypertension   Chronic combined systolic and diastolic CHF (congestive heart failure) (HCC)   Acute renal failure superimposed on stage 4 chronic kidney disease (HCC)   Hyponatremia   Upper GI bleeding  Symptomatic anemia, iron deficiency anemia, recurrent from likely GI bleeding on AOCD:  - Complete 2nd unit PRBCs and recheck hgb. If <7 will repeat transfusion 1u PRBCs.  - Daughter reported some reaction to iron IV in the past. Will hold for now.  GI bleeding: Melena on DRE per EDP. Recent GI work up including EGD, capsule endoscopy revealed erosive gastritis and no source of bleeding. - Defer to GI. Cardiology/HF team consulted to comment on procedural risk.  - On PPI  AKI on stage IV CKD: With rise in BUN (also possibly due to GIB). Likely hemodynamically mediated due to ongoing GI bleeding, losses of  pleural fluid, and hypotension. Urinalysis bland without mention of casts. - Nephrology consulted for further recommendations. I discussed with both the patient and her daughter that I felt hemodialysis would not improve her prognosis and may not be possible if BP remains low.  Acute on chronic combined HFrEF, NICM: Weight up from recent DC - Feel she's likely overloaded but hypotensive. Would appreciate HF team input on next steps as well.  - Hold imdur  Recurrent right pleural effusion:  - Drain pleurx QOD  Hypokalemia:  - Supplement cautiously  Permanent AFib s/p AV ablation and PPM:  - Holding ASA  Obesity: BMI currently 39. Noted.  DVT prophylaxis: SCDs Code Status: DNR confirmed with patient and daughter.  Family Communication: Daughter at length by phone this morning Disposition Plan: Uncertain, guarded prognosis  Consultants:   Cardiology  GI  Nephrology  Likely to involve palliative care pending clinical trajectory  Procedures:   2u PRBC 4/8.  Antimicrobials:  None   Subjective: Tired, feels weak all over, no focal weakness or numbness. Denies dyspnea, chest pain or palpitations.   Objective: Vitals:   01/01/19 0910 01/01/19 0911 01/01/19 0935 01/01/19 1138  BP: (!) 102/51 (!) 99/54 (!) 99/51 105/63  Pulse: 95 (!) 106 90 90  Resp: 15  18 18   Temp: 98 F (36.7 C)   97.8 F (36.6 C)  TempSrc: Oral   Oral  SpO2: 99%  99% 97%  Weight:      Height:        Intake/Output Summary (Last 24 hours) at 01/01/2019 1410 Last data filed at 01/01/2019 1215  Gross per 24 hour  Intake 300.75 ml  Output 1650 ml  Net -1349.25 ml   Filed Weights   12/31/18 2144  Weight: 112.2 kg   Gen: Chronically ill-appearing female Pulm: Non-labored with decrease at bases R > L, some crackles, no wheezes  CV: Regular paced. No murmur, rub, or gallop. Significant JVD, Significant dependent edema. GI: Abdomen soft, non-tender, non-distended, with normoactive bowel sounds. No  organomegaly or masses felt. Ext: Warm, no deformities Skin: No tenderness, erythema over pacer. Neuro: Tired but alert and oriented. No focal neurological deficits. No asterixis Psych: Judgement and insight appear normal. Mood & affect appropriate.   Data Reviewed: I have personally reviewed following labs and imaging studies  CBC: Recent Labs  Lab 12/31/18 1732 12/31/18 1828 01/01/19 0735  WBC 6.0  --  4.7  NEUTROABS  --  6.1  --   HGB 5.3*  --  5.8*  HCT 18.0*  --  19.3*  MCV 93.3  --  91.0  PLT 173  --  211   Basic Metabolic Panel: Recent Labs  Lab 12/31/18 1732 12/31/18 1828 01/01/19 0629  NA 127*  --  130*  K 3.4*  --  2.7*  CL 86*  --  89*  CO2 27  --  27  GLUCOSE 162*  --  134*  BUN 161*  --  160*  CREATININE 3.57*  --  3.39*  CALCIUM 8.6*  --  8.5*  MG  --  3.2*  --    GFR: Estimated Creatinine Clearance: 18.8 mL/min (A) (by C-G formula based on SCr of 3.39 mg/dL (H)). Liver Function Tests: Recent Labs  Lab 12/31/18 1732  AST 21  ALT 14  ALKPHOS 59  BILITOT 0.6  PROT 5.5*  ALBUMIN 2.8*   No results for input(s): LIPASE, AMYLASE in the last 168 hours. No results for input(s): AMMONIA in the last 168 hours. Coagulation Profile: Recent Labs  Lab 12/31/18 2238  INR 1.1   Cardiac Enzymes: Recent Labs  Lab 12/31/18 2238 01/01/19 0629  TROPONINI 0.04* 0.04*   BNP (last 3 results) No results for input(s): PROBNP in the last 8760 hours. HbA1C: No results for input(s): HGBA1C in the last 72 hours. CBG: No results for input(s): GLUCAP in the last 168 hours. Lipid Profile: No results for input(s): CHOL, HDL, LDLCALC, TRIG, CHOLHDL, LDLDIRECT in the last 72 hours. Thyroid Function Tests: No results for input(s): TSH, T4TOTAL, FREET4, T3FREE, THYROIDAB in the last 72 hours. Anemia Panel: Recent Labs    12/31/18 1828  VITAMINB12 3,640*  FOLATE 38.1  FERRITIN 40  TIBC 521*  IRON 13*  RETICCTPCT 6.3*   Urine analysis:    Component Value  Date/Time   COLORURINE STRAW (A) 12/31/2018 2015   APPEARANCEUR CLEAR 12/31/2018 2015   LABSPEC 1.009 12/31/2018 2015   PHURINE 6.0 12/31/2018 2015   GLUCOSEU NEGATIVE 12/31/2018 2015   HGBUR SMALL (A) 12/31/2018 2015   BILIRUBINUR NEGATIVE 12/31/2018 2015   KETONESUR NEGATIVE 12/31/2018 2015   PROTEINUR NEGATIVE 12/31/2018 2015   UROBILINOGEN 0.2 03/06/2013 2035   NITRITE NEGATIVE 12/31/2018 2015   LEUKOCYTESUR MODERATE (A) 12/31/2018 2015   Recent Results (from the past 240 hour(s))  MRSA PCR Screening     Status: None   Collection Time: 12/31/18  9:58 PM  Result Value Ref Range Status   MRSA by PCR NEGATIVE NEGATIVE Final    Comment:        The GeneXpert MRSA Assay (FDA approved for NASAL specimens only),  is one component of a comprehensive MRSA colonization surveillance program. It is not intended to diagnose MRSA infection nor to guide or monitor treatment for MRSA infections. Performed at [facility], [place] [address], [place], [place] [address]       Radiology Studies: Dg Chest Portable 1 View  Result Date: 12/31/2018 CLINICAL DATA:  Anemia. Epigastric region pain. History of atrial fibrillation EXAM: PORTABLE CHEST 1 VIEW COMPARISON:  December 02, 2018 FINDINGS: There is scarring in the lateral right base. There is no appreciable edema or consolidation. There is generalized cardiomegaly, stable. Pulmonary vascularity is normal. Pacemaker leads attached to the right ventricle. There is aortic atherosclerosis. No adenopathy. No pneumothorax. No bone lesions. IMPRESSION: Scarring lateral right base. No edema or consolidation. Generalized cardiomegaly. Pacemaker lead attached to right ventricle. Aortic Atherosclerosis (ICD10-I70.0). Electronically Signed   By: Lowella Grip III M.D.   On: 12/31/2018 21:31    Scheduled Meds: . sodium chloride   Intravenous Once  . pantoprazole  40 mg Oral BID  . sodium chloride flush  3 mL Intravenous Q12H  . sodium chloride flush  3  mL Intravenous Q12H   Continuous Infusions: . sodium chloride       LOS: 0 days   Time spent: 35 minutes.  Patrecia Pour, MD [facility] [email or web address] Password [email or web address] 01/01/2019, 2:10 PM

## 2019-01-01 NOTE — Progress Notes (Signed)
CRITICAL VALUE ALERT  Critical Value:  Troponin = 0.04  Date & Time Notied:  01/01/19  0004  Provider Notified:  Tylene Fantasia NP  Orders Received/Actions taken:  To trend Troponin

## 2019-01-01 NOTE — TOC Initial Note (Addendum)
Transition of Care [facility]) - Initial/Assessment Note    Patient Details  Name: [name] [name] MRN: [id number] Date of Birth: Nov 19, 1945  Transition of Care [facility]) CM/SW Contact:    Sharin Mons, RN Phone Number: 01/01/2019, 1:24 PM  Clinical Narrative:                 Presents with symptomatic anemia, hx of hypertension, chronic combined systolic and diastolic CHF, chronic kidney disease stage IV, and severe iron deficiency anemia, R Pleurx catheter / drain catheter every 48 hours.From home with daughter. Multi admits. PTA active with [facility]  (RN), [phone] (office), [phone])  Home DME: W/C, hospital bed, pleurx bottles.   Larrie Kass (Daughter) Melinda Crutch (Daughter)     [phone]       NCM following for TOC needs....  Expected Discharge Plan: [facility](Resides daughter)     Patient Goals and CMS Choice Patient states their goals for this hospitalization and ongoing recovery are:: I don't know! CMS Medicare.gov Compare Post Acute Care list provided to:: Patient Choice offered to / list presented to : Patient, Adult Children  Expected Discharge Plan and Services Expected Discharge Plan: [facility](Resides daughter)   Discharge Planning Services: CM Consult Post Acute Care Choice: Home Health, Resumption of Svcs/PTA Provider Living arrangements for the past 2 months: Single Family Home                          Prior Living Arrangements/Services Living arrangements for the past 2 months: Single Family Home   Patient language and need for interpreter reviewed:: Yes        Need for Family Participation in Patient Care: Yes (Comment) Care giver support system in place?: Yes (comment) Current home services: Home RN([facility]) Criminal Activity/Legal Involvement Pertinent to Current Situation/Hospitalization: No - Comment as needed  Activities of Daily Living Home  Assistive Devices/Equipment: Wheelchair, Eyeglasses, CPAP ADL Screening (condition at time of admission) Patient's cognitive ability adequate to safely complete daily activities?: Yes Is the patient deaf or have difficulty hearing?: No Does the patient have difficulty seeing, even when wearing glasses/contacts?: No Does the patient have difficulty concentrating, remembering, or making decisions?: Yes Patient able to express need for assistance with ADLs?: Yes Does the patient have difficulty dressing or bathing?: No Independently performs ADLs?: Yes (appropriate for developmental age) Does the patient have difficulty walking or climbing stairs?: Yes Weakness of Legs: Both Weakness of Arms/Hands: None  Permission Sought/Granted   Permission granted to share information with : Yes, Verbal Permission Granted  Share Information with NAME: Etheline Geppert (Daughter)Sherri Hammock (Daughter)           Emotional Assessment Appearance:: Appears stated age Attitude/Demeanor/Rapport: Other (comment)(irritated) Affect (typically observed): Appropriate, Irritable Orientation: : Oriented to Self, Oriented to Situation, Oriented to Place, Oriented to  Time Alcohol / Substance Use: Never Used Psych Involvement: No (comment)  Admission diagnosis:  Symptomatic anemia [D64.9] Patient Active Problem List   Diagnosis Date Noted  . Iron deficiency anemia due to chronic blood loss   . Acute blood loss anemia   . Upper GI bleeding 12/07/2018  . Chronic blood loss anemia 12/05/2018  . Heme positive stool   . Acute on chronic systolic CHF (congestive heart failure) ([place])   . Severe anemia 11/06/2018  . Goals of care, counseling/discussion   . Palliative care by specialist   . DNR (do not resuscitate) discussion   .  Hypertensive cardiovascular-renal disease, stage 1-4 or unspecified chronic kidney disease, with heart failure ([place]) 10/06/2018  . Hypokalemia 10/06/2018  . Hyponatremia 10/06/2018  .  Thrombocytopenia ([place]) 10/06/2018  . Acute on chronic combined systolic and diastolic congestive heart failure, NYHA class 4 ([place]) 10/06/2018  . Acute on chronic systolic and diastolic heart failure, NYHA class 4 ([place]) 10/06/2018  . Gastrointestinal hemorrhage 10/03/2018  . Gastritis and gastroduodenitis   . AKI (acute kidney injury) ([place])   . Symptomatic anemia   . Acute renal failure superimposed on stage 4 chronic kidney disease ([place])   . Syncope 06/21/2018  . Iron deficiency 06/21/2018  . GERD (gastroesophageal reflux disease) 10/04/2017  . Acute on chronic combined systolic and diastolic CHF (congestive heart failure) ([place]) 04/02/2017  . NICM (nonischemic cardiomyopathy) ([place]) 02/22/2017  . CKD stage G3b/A1, GFR 30-44 and albumin creatinine ratio <30 mg/g (HCC)   . Dysphagia, idiopathic 02/15/2017  . Pleural effusion on right 01/30/2017  . Osteoporosis 08/10/2016  . Liver cirrhosis secondary to NASH ([place]) 06/13/2016  . Chest pain 10/30/2014  . CKD (chronic kidney disease), stage IV ([place]) 03/07/2013  . Sinoatrial node dysfunction (HCC)   . Chronic combined systolic and diastolic CHF (congestive heart failure) ([place]) 05/07/2011  . Morbid obesity ([place]) 05/07/2011  . 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 OSA (obstructive sleep apnea) 05/05/2009  . Essential hypertension 05/04/2009  . Permanent atrial fibrillation 05/04/2009  . ALLERGIC RHINITIS 05/04/2009   PCP:  [name], MD Pharmacy:   [facility], [place] [address] Phone: [phone] Fax: [phone]     Social Determinants of Health (SDOH) Interventions    Readmission Risk Interventions Readmission Risk Prevention Plan 12/10/2018  Transportation Screening Complete  Medication Review ([place]) Patient Refused  PCP or Specialist appointment within 3-5 days of discharge Complete  HRI or [place] Not Complete  HRI or  Home Care Consult Pt Refusal Comments Need to find home health agency that can manage pleurex drain.  SW Recovery Care/Counseling Consult Complete  Palliative Care Screening Not Applicable  Skilled Nursing Facility Not Applicable  Some recent data might be hidden

## 2019-01-01 NOTE — Progress Notes (Signed)
Daily Rounding Note  01/01/2019, 9:49 AM  LOS: 0 days   SUBJECTIVE:   Chief complaint: FOBT + anemia     Feels generally weak and unwell.  No nausea, no abd pain  OBJECTIVE:         Vital signs in last 24 hours:    Temp:  [97.7 F (36.5 C)-98.5 F (36.9 C)] 98 F (36.7 C) (04/08 0910) Pulse Rate:  [90-106] 90 (04/08 0935) Resp:  [8-23] 18 (04/08 0935) BP: (84-125)/(34-65) 99/51 (04/08 0935) SpO2:  [96 %-100 %] 99 % (04/08 0935) Weight:  [112.2 kg] 112.2 kg (04/07 2144)   Filed Weights   12/31/18 2144  Weight: 112.2 kg   General: morbidly obese, chronically ill looking   Heart: RRR Chest: diminished BS.  pleurex catheter on R thorax is covered with dry gauze.  Slight dyspnea, no cough Abdomen: soft, NT, ND, obese.  BS hypoactive  Extremities: marked LE bil edema, superficial ulcerations Neuro/Psych:  Sleepy but arouseable.  Oriented x 3.  Moves all 4s.  Flat affect.    Intake/Output from previous day: 04/07 0701 - 04/08 0700 In: 300.8 [Blood:300.8] Out: 450 [Urine:450]  Intake/Output this shift: Total I/O In: -  Out: 800 [Urine:800]  Lab Results: Recent Labs    12/31/18 1732 01/01/19 0735  WBC 6.0 4.7  HGB 5.3* 5.8*  HCT 18.0* 19.3*  PLT 173 158   BMET Recent Labs    12/31/18 1732 01/01/19 0629  NA 127* 130*  K 3.4* 2.7*  CL 86* 89*  CO2 27 27  GLUCOSE 162* 134*  BUN 161* 160*  CREATININE 3.57* 3.39*  CALCIUM 8.6* 8.5*   LFT Recent Labs    12/31/18 1732  PROT 5.5*  ALBUMIN 2.8*  AST 21  ALT 14  ALKPHOS 59  BILITOT 0.6   PT/INR Recent Labs    12/31/18 2238  LABPROT 14.2  INR 1.1   Hepatitis Panel No results for input(s): HEPBSAG, HCVAB, HEPAIGM, HEPBIGM in the last 72 hours.  Studies/Results: Dg Chest Portable 1 View  Result Date: 12/31/2018 CLINICAL DATA:  Anemia. Epigastric region pain. History of atrial fibrillation EXAM: PORTABLE CHEST 1 VIEW COMPARISON:  December 02, 2018 FINDINGS: There is scarring in the lateral right base. There is no appreciable edema or consolidation. There is generalized cardiomegaly, stable. Pulmonary vascularity is normal. Pacemaker leads attached to the right ventricle. There is aortic atherosclerosis. No adenopathy. No pneumothorax. No bone lesions. IMPRESSION: Scarring lateral right base. No edema or consolidation. Generalized cardiomegaly. Pacemaker lead attached to right ventricle. Aortic Atherosclerosis (ICD10-I70.0). Electronically Signed   By: Lowella Grip III M.D.   On: 12/31/2018 21:31   Scheduled Meds: . sodium chloride   Intravenous Once  . pantoprazole (PROTONIX) IV  40 mg Intravenous Q12H  . potassium chloride  40 mEq Oral Once  . sodium chloride flush  3 mL Intravenous Q12H  . sodium chloride flush  3 mL Intravenous Q12H   Continuous Infusions: . sodium chloride     PRN Meds:.sodium chloride, fentaNYL (SUBLIMAZE) injection, ondansetron **OR** ondansetron (ZOFRAN) IV, sodium chloride flush   ASSESMENT:   *   Recurrent FOBT + and IDA.   09/25/2018 erosive gastritis, no H. Pylori.  BID Protonix, Daily po iron, 325 ASA daily at home.   1/ 17/2020 capsule endoscopy unremarkable, rapid gastric transit. Declined colonoscopy on multiple occasions, believes Dr Haroldine Laws does not want her to undergo colonoscopy.  Only previous colonoscopy 1998 with  diminutive rectal polyps, internal hemorrhoids, sigmoid diverticulosis. Hgb 5.3 >> 5.8.  Received 2nd unit blood as of 9 AM today.     *  Cirrhosis by ultrasound 08/29/2018 in [place], chest CT 11/08/2018.  Platelets, coags normal.     *  Chronic R pleural effusion.  S/p Pleurex catheter, drains this QOD, 1 Liter drained 2 d ago.  CXR without effusion   *   Hypokalemia.  Oral potassium ordered.    *   Stage 4-5 CKD.  This certainly contributing to anemia.  Renal consult placed.    *    Implanted cardiac pacemaker.    PLAN   *  Switch to po Protonix.  Continue clear  liquids.    *   CHF team will be assessing pt and let GI know if ok to pursue colonoscopy (obviously high risk in setting of so many comormidities).  Any endoscopic cases would get scheduled for tmrw.      Azucena Freed  01/01/2019, 9:49 AM Phone [phone]

## 2019-01-01 NOTE — Progress Notes (Addendum)
CRITICAL VALUE ALERT  Critical Value:  Hemoglobin 5.8  Date & Time Notied: 01/01/19 08:30  Provider Notified: Bonner Puna  Orders Received/Actions taken: administer one unit of RBC  and reorder H&H to be drawn 2 hours after administration.

## 2019-01-01 NOTE — Progress Notes (Signed)
CRITICAL VALUE ALERT  Critical Value:  k 2.7  Date & Time Notied:  01/01/19, 1102  Provider Notified: MD. Bonner Puna

## 2019-01-01 NOTE — Consult Note (Signed)
Advanced Heart Failure Team Consult Note   Primary Physician: [name], MD PCP-Cardiologist:  Dorris Carnes, MD  Reason for Consultation: A/C systolic HF  HPI:    [name] [name] is seen today for evaluation of A/C systolic HF at the request of Dr Bonner Puna.   [name] [name] is a 73 y.o. female with a history of morbid obesity, afib s/p AV ablation andSt JudePPM, chronic combined HF, CKD IV, severe MR, TR, pulmonary HTN, OSA on BiPAP, GERD, gout, and iron deficiency anemia. Cardiac cath 2009 with normal coronary arteries   Admitted 06/20/18 with CP and SOB.Echo was completed and showed EF 40-45% with severe MR/TR. Cause of cardiomyopathy uncertain. Could be valvular, could be due to chronic RV pacing . Underwent RHC and TEE (see below). Diuresed with IV lasix and transistioned to torsemide 80 mg twice a day. Discharge weight 244.6 pounds.   She saw Dr Burt Knack on 10/16 for possible mitral clip. There was concern mitral clip may not make a difference due to multiple co-morbidities including RV failure with severe TR.   ECG + capsule endoscopy unremarkable in 1/20.   Admitted 2/11 - 11/18/18 with symptomatic anemia and chest pain. Course complicated by pleural effusion requiring R Pleurx drain.   Last seen in HF clinic 11/28/18. At that time volume was elevated and she was instructed to take additional metolazone (on scheduled MWF). Discussed that she may be heading toward HD. Weight was 248 lbs.   Admitted 3/12-3/18/20 with Hgb 6.6. Received 4u PRBC with improvement to 8.8. No GI work up pursued due to pt's high risk. DC weight was 235 lbs.   She was sent from nephrologist's office to ED due to Hgb of 5.4 on routine labs. She has been fatigued and has had cramping in right thigh. Melena noted on DRE by ED MD. 2u PRBCs ordered. Of note, getting blood for her in the past has taken some time (1-2 days) due to her antibodies. She received 500 cc bolus in ED due to hypotension.    Pertinent admission labs include: Na 127, K 3.4, creatinine 3.57 (up from baseline ~2.6) with BUN 161, WBC 6.0, Hgb 5.3, platelets 173, Iron 13, sat ratios 2, TIBC 521, fecal occult +, troponin 0.04 -> 0.04  CXR: Scarring lateral right base. No edema or consolidation. Generalized cardiomegaly. Pacemaker lead attached to right ventricle. Aortic Atherosclerosis  GI consulted. She was started on IV protonix and is NPO. Planning for potential colonoscopy and repeat upper GI endoscopy, but would like cardiology input/tune up prior.  Today, K is 2.7, Na 130, creatinine 3.39, Hgb 5.8. SBP 100s.   Currently, BP 105/77.  She interacts but is drowsy.  Able to answer questions and does not seem confused.  She has been more short of breath recently at home and profoundly fatigued.  Thinks her weight is up some.  No chest pain.  Not lightheaded.   She has had chronic recurrent GI bleeding, melena noted recently.  She has had 2 units PRBCs today.   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   Echo (06/21/2018): EF 40-45% Moderate TR, Severe MR   Echo 10/07/18: EF 30-35%, mod MR, RV normal function but  mod dilated, mod TR  Review of systems complete and found to be negative unless listed in HPI.   Home Medications Prior to Admission medications   Medication Sig Start Date End Date Taking? Authorizing Provider  allopurinol (ZYLOPRIM) 300 MG tablet Take 300 mg by mouth every evening.    Yes [provider]  aspirin EC 325 MG tablet Take 325 mg by mouth every other day.    Yes [provider]  calcitRIOL (ROCALTROL) 0.25 MCG capsule Take 0.25 mcg by mouth every other day.    Yes [provider]  COD LIVER  OIL PO Take 1 capsule by mouth daily.    Yes [provider]  docusate sodium (COLACE) 100 MG capsule Take 100 mg by mouth daily as needed for mild constipation.    Yes [provider]  ferrous sulfate 325 (65 FE) MG tablet Take 325 mg by mouth at bedtime.    Yes [provider]  HYDROcodone-acetaminophen (NORCO) 10-325 MG tablet Take 1 tablet by mouth See admin instructions. Take 1 tablet by mouth two times a day and an additional 0.5 tablet two times a day as needed for pain 11/15/18  Yes Gherghe, Vella Redhead, MD  isosorbide mononitrate (IMDUR) 30 MG 24 hr tablet Take 0.5 tablets (15 mg total) by mouth daily. Patient taking differently: Take 15 mg by mouth at bedtime.  12/10/17  Yes Fay Records, MD  metolazone (ZAROXOLYN) 2.5 MG tablet Take 2.5 mg by mouth See admin instructions. Take 1 tablet by mouth every Monday, Wednesday, Friday, Saturday.   Yes [provider]  polyethylene glycol (MIRALAX / GLYCOLAX) packet Take 17 g by mouth at bedtime.   Yes [provider]  potassium chloride SA (K-DUR,KLOR-CON) 20 MEQ tablet Take 1 tablet (20 mEq total) by mouth 3 (three) times a week. Taking 55mq twice daily = 469m on Monday, Wednesday, Friday. Patient taking differently: Take 20-40 mEq by mouth See admin instructions. Take 2 tablets in the morning and 1 tablet by mouth in the evening on Monday, Wednesday, Friday, Saturday 12/11/18  Yes Lama, GaMarge DuncansMD  torsemide (DEMADEX) 20 MG tablet Take 4 tablets (80 mg total) by mouth 2 (two) times daily. May also take 1 tablet (20 mg total) as needed (for weight greater than 246). Patient taking differently: Take 80 mg by mouth two times a day and an additional 20 mg as needed for a weight gain >246 07/15/18  Yes Clegg, Amy D, NP  vitamin B-12 (CYANOCOBALAMIN) 500 MCG tablet Take 500 mcg by mouth daily.   Yes [provider]  feeding supplement (BOOST HIGH PROTEIN) LIQD Take 1 Container by mouth 2 (two) times  daily.     [provider]  methocarbamol (ROBAXIN) 500 MG tablet Take 1 tablet (500 mg total) by mouth every 6 (six) hours as needed for muscle spasms. Patient not taking: Reported on 12/31/2018 10/18/18   AdShelly CossMD  Misc. Devices MISC 1 each by Does not apply route at bedtime. C-PAP    [provider]  pantoprazole (PROTONIX) 40 MG tablet Take 1 tablet (40 mg total) by mouth 2 (two) times daily. Patient not taking: Reported on 12/31/2018 10/02/18   LeWestly Pam  Past Medical History: Past Medical History:  Diagnosis Date  . Anemia   . Atrial fibrillation ([place]  . Cataract   . Cervical cancer ([place]   s/p hysterectomy  . Chronic cellulitis   . Chronic combined  systolic and diastolic congestive heart failure, NYHA class 2 (HCC)    LVEF 25-30% with restrictive diastolic filling  . Degenerative joint disease   . Diverticulitis   . Essential hypertension   . GERD (gastroesophageal reflux disease)   . Gout   . Kidney stones   . Morbid obesity ([place])   . NICM (nonischemic cardiomyopathy) ([place]) 02/22/2017  . Osteoarthritis   . Permanent atrial fibrillation 05/04/2009   Qualifier: History of  By: Quentin Cornwall CMA, Janett Billow    . 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   Qualifier: Diagnosis of  By: Lovena Le, MD, [facility], Binnie Kand   . Sinoatrial node dysfunction [facility])    Status post PPM - Dr. Lovena Le  . Sleep apnea    CPAP    Past Surgical History: Past Surgical History:  Procedure Laterality Date  . ABDOMINAL HYSTERECTOMY  1991  . BIOPSY  09/26/2018   Procedure: BIOPSY;  Surgeon: Lavena Bullion, DO;  Location: [place];  Service: Gastroenterology;;  . CHEST TUBE INSERTION Right 11/11/2018   Procedure: INSERTION PLEURAL DRAINAGE CATHETER;  Surgeon: Ivin Poot, MD;  Location: [place];  Service: Thoracic;  Laterality: Right;  . COLONOSCOPY  1998   one polyp per patient  . ESOPHAGOGASTRODUODENOSCOPY (EGD) WITH PROPOFOL N/A 09/26/2018    Procedure: ESOPHAGOGASTRODUODENOSCOPY (EGD) WITH PROPOFOL;  Surgeon: Lavena Bullion, DO;  Location: [place];  Service: Gastroenterology;  Laterality: N/A;  . EYE SURGERY    . GIVENS CAPSULE STUDY N/A 10/09/2018   Procedure: GIVENS CAPSULE STUDY;  Surgeon: Carol Ada, MD;  Location: [place];  Service: Endoscopy;  Laterality: N/A;  . IR THORACENTESIS ASP PLEURAL SPACE W/IMG GUIDE  10/09/2018  . PACEMAKER INSERTION  Nov 2000   St Jude with revision in 2011  . PERCUTANEOUS NEPHROLITHOTOMY  April 2012  . RIGHT HEART CATH N/A 06/28/2018   Procedure: RIGHT HEART CATH;  Surgeon: Jolaine Artist, MD;  Location: [place] CV LAB;  Service: Cardiovascular;  Laterality: N/A;  . TEE WITHOUT CARDIOVERSION N/A 06/28/2018   Procedure: TRANSESOPHAGEAL ECHOCARDIOGRAM (TEE);  Surgeon: Jolaine Artist, MD;  Location: [facility] ENDOSCOPY;  Service: Cardiovascular;  Laterality: N/A;    Family History: Family History  Problem Relation Age of Onset  . Heart attack Father   . Stroke Mother   . Diabetes Sister   . Colon cancer Other        seven family members  . Liver disease Neg Hx   . Other Neg Hx     Social History: Social History   Socioeconomic History  . Marital status: Widowed    Spouse name: Not on file  . Number of children: 1  . Years of education: Not on file  . Highest education level: Some college, no degree  Occupational History  . Not on file  Social Needs  . Financial resource strain: Not hard at all  . Food insecurity:    Worry: Never true    Inability: Never true  . Transportation needs:    Medical: No    Non-medical: No  Tobacco Use  . Smoking status: Former Smoker    Packs/day: 1.00    Types: Cigarettes    Last attempt to quit: 09/26/1979    Years since quitting: 39.2  . Smokeless tobacco: Never Used  Substance and Sexual Activity  . Alcohol use: No  . Drug use: No  . Sexual activity: Not Currently  Lifestyle  . Physical activity:    Days per week:  Not on  file    Minutes per session: Not on file  . Stress: Not on file  Relationships  . Social connections:    Talks on phone: Not on file    Gets together: Not on file    Attends religious service: Not on file    Active member of club or organization: Not on file    Attends meetings of clubs or organizations: Not on file    Relationship status: Not on file  Other Topics Concern  . Not on file  Social History Narrative  . Not on file    Allergies:  Allergies  Allergen Reactions  . Beta Adrenergic Blockers Anaphylaxis and Other (See Comments)    "DEATH" (Bradycardia and her organs shut down)  . Cephalexin Shortness Of Breath  . Codeine Anaphylaxis and Swelling    Throat swelling  . Contrast Media [Iodinated Diagnostic Agents] Other (See Comments)    Patient states "I have chronic kidney disease, so the doctor said no dye in my veins."  . Coreg [Carvedilol] Other (See Comments)    Beta Blockers cause her organs to shut down (per patient)  . Peppermint Flavor Shortness Of Breath  . Prednisone Anaphylaxis, Shortness Of Breath and Swelling    Throat swells   . Tape Other (See Comments)    States plastic tape blisters her skin  . Ciprofloxacin Hives    Tolerating levofloxin   . Latex Itching and Rash  . Penicillins Hives    DID THE REACTION INVOLVE: Swelling of the face/tongue/throat, SOB, or low BP? Yes Sudden or severe rash/hives, skin peeling, or the inside of the mouth or nose? Yes Did it require medical treatment? Pt was in hospital at the time of reaction When did it last happen?Was in her mid-40's If all above answers are "NO", may proceed with cephalosporin use.  . Iron Other (See Comments)    Per Patient- Felt like skin was crawling, foaming at mouth, and felt like she was going "crazy"  . Diamox [Acetazolamide] Rash    Rash after 2 doses of diamox     Objective:    Vital Signs:   Temp:  [97.7 F (36.5 C)-98.5 F (36.9 C)] 98.1 F (36.7 C) (04/08 0838)  Pulse Rate:  [90-99] 90 (04/08 0838) Resp:  [8-23] 18 (04/08 0838) BP: (84-125)/(34-65) 108/65 (04/08 0838) SpO2:  [96 %-100 %] 98 % (04/08 0838) Weight:  [112.2 kg] 112.2 kg (04/07 2144)    Weight change: Filed Weights   12/31/18 2144  Weight: 112.2 kg    Intake/Output:   Intake/Output Summary (Last 24 hours) at 01/01/2019 0908 Last data filed at 01/01/2019 0706 Gross per 24 hour  Intake 300.75 ml  Output 1250 ml  Net -949.25 ml      Physical Exam    General:  No resp difficulty. Chronically ill appearing.  HEENT: normal Neck: supple. JVP 16+ cm. Carotids 2+ bilat; no bruits. No lymphadenopathy or thyromegaly appreciated. Cor: PMI nondisplaced. Regular rate & rhythm. 3/6 HSM LLSB/apex Lungs: Decreased BS at bases.  Abdomen: soft, nontender, nondistended. No hepatosplenomegaly. No bruits or masses. Good bowel sounds. Extremities: no cyanosis, clubbing, rash. Chronic venous stasis changes.  Neuro: Drowsy but will answer questions appropriately. Cranial nerves grossly intact. moves all 4 extremities w/o difficulty. Affect pleasant   Telemetry   Atrial fibrillation with RV pacing at 90 (personally reviewed)  EKG    Vpaced 90 bpm, underlying atrial fibrillation. Personally reviewed.   Labs   Basic Metabolic Panel: Recent Labs  Lab 12/31/18 1732 12/31/18 1828 01/01/19 0629  NA 127*  --  130*  K 3.4*  --  2.7*  CL 86*  --  89*  CO2 27  --  27  GLUCOSE 162*  --  134*  BUN 161*  --  160*  CREATININE 3.57*  --  3.39*  CALCIUM 8.6*  --  8.5*  MG  --  3.2*  --     Liver Function Tests: Recent Labs  Lab 12/31/18 1732  AST 21  ALT 14  ALKPHOS 59  BILITOT 0.6  PROT 5.5*  ALBUMIN 2.8*   No results for input(s): LIPASE, AMYLASE in the last 168 hours. No results for input(s): AMMONIA in the last 168 hours.  CBC: Recent Labs  Lab 12/31/18 1732 12/31/18 1828 01/01/19 0735  WBC 6.0  --  4.7  NEUTROABS  --  6.1  --   HGB 5.3*  --  5.8*  HCT 18.0*  --   19.3*  MCV 93.3  --  91.0  PLT 173  --  158    Cardiac Enzymes: Recent Labs  Lab 12/31/18 2238 01/01/19 0629  TROPONINI 0.04* 0.04*    BNP: BNP (last 3 results) Recent Labs    06/20/18 2249 10/06/18 1309 11/05/18 2224  BNP 160.6* 214.8* 228.9*    ProBNP (last 3 results) No results for input(s): PROBNP in the last 8760 hours.   CBG: No results for input(s): GLUCAP in the last 168 hours.  Coagulation Studies: Recent Labs    12/31/18 2236-12-12  LABPROT 14.2  INR 1.1     Imaging   Dg Chest Portable 1 View  Result Date: 12/31/2018 CLINICAL DATA:  Anemia. Epigastric region pain. History of atrial fibrillation EXAM: PORTABLE CHEST 1 VIEW COMPARISON:  December 02, 2018 FINDINGS: There is scarring in the lateral right base. There is no appreciable edema or consolidation. There is generalized cardiomegaly, stable. Pulmonary vascularity is normal. Pacemaker leads attached to the right ventricle. There is aortic atherosclerosis. No adenopathy. No pneumothorax. No bone lesions. IMPRESSION: Scarring lateral right base. No edema or consolidation. Generalized cardiomegaly. Pacemaker lead attached to right ventricle. Aortic Atherosclerosis (ICD10-I70.0). Electronically Signed   By: Lowella Grip III M.D.   On: 12/31/2018 21:31      Medications:     Current Medications: . sodium chloride   Intravenous Once  . pantoprazole (PROTONIX) IV  40 mg Intravenous Q12H  . sodium chloride flush  3 mL Intravenous Q12H  . sodium chloride flush  3 mL Intravenous Q12H     Infusions: . sodium chloride         Patient Profile   [name] [name] is a 73 y.o. female with a history of morbid obesity, afib s/p AV ablation andSt JudePPM, chronic combined HF, CKD IV, severe MR, TR, pulmonary HTN, OSA on BiPAP, GERD, gout, and iron deficiency anemia. Cardiac cath 2007-12-13 with normal coronary arteries   Sent to [facility] after routine labs showed Hgb 5.4.   Assessment/Plan   1. Anemia/GI bleed -  Hgb 5.4 on admit. 2u PRBCs ordered, but delayed due to antibodies - EGD and capsule negative 1/20 - Hemoccult positive. GI following. Planning for potential upper endoscopy and colonoscopy. She is on IV protonix.  - Iron low. Give IV iron per PharmD.   2. Acute on Chronic combined systolic/diastolic heart failure  - ECHO 06/21/2018 EF 40-45% LV severely dilated. Mild LVH, akinesis of mid-apicalanteroseptal and apical myocardium, mild AS, severe MR, LA and RA severely dilated,  moderate TR, PA peak pressure 42 mmHg. [place] 10/19 showed preserved cardiac output with markedly elevated right and left heart filling pressures (very prominent RV failure). TEE looked like restrictive cardiomyopathy.Repeat echo in 1/20 with EF 30-35% (lower), wall motion abnormalities, moderate MR, moderately dilated RV, moderate TR, PASP 59 mmHg. Cause of cardiomyopathy uncertain, chronic RV pacing may play a role.  - Volume elevated. Weight is up 12 lbs from recent DC weight.  - Diuretics held with acute GI bleed and hypotension. She is on torsemide 80 mg BID + metolazone 3x/weekly at home. Creatinine 3.4 today.  - Not on Ace/ARB/ARNI/spiro or digoxin with CKD IV.   3. Pleural Effusion, Right - s/p R pleurx cath 11/11/18   4. CKD Stage IV - Creatinine baseline ~2.5  - Creatinine 3.4 today. Could be secondary to ATN in setting of blood loss. May need to involve renal. She is followed by Dr Deterding outpatient.   5. A fib S/P AV nodal ablation and PPM  - Chronic RV pacing in the 90s. - She has refused anticoagulation in the past and now also has chronic GI bleeding.   6. Hypokalemia -K 2.7 today. Supp cautiously with CKD. Recheck BMET at 14:00.   7. DNR/DNI   Length of Stay: 0  Georgiana Shore, NP  01/01/2019, 9:08 AM  Advanced Heart Failure Team Pager [phone] (M-F; 7a - 4p)  Please contact [facility] for night-coverage after hours (4p -7a ) and weekends on [email or web address]  Patient seen with NP, agree  with the above note.   She was admitted from nephrology appointment with marked anemia, hgb 5.3, and melena.  Weight is up and she has been fatigued/short of breath.  BUN/creatinine up to 160/3.39 today, she is drowsy but awakens and can converse.  BP soft but 105/77 most recently.   On exam, JVP 16+ cm.  1+ edema to knees.  Regular S1S2 with 3/6 HSM LLSB/apex.   1. Acute on chronic systolic CHF: Suspected nonischemic cardiomyopathy, RV pacing may contribute. Most recent echo in 1/20 with EF 30-35%, mild AS, moderate MR, moderate TR, moderately dilated RV.  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  Complicated by CKD stage IV.  On exam today, she is volume overloaded.  She is mildly tachypneic. However, BUN/creatinine also up considerably.   - I will start her on Lasix 120 mg IV bid and assess response.  I am concerned that renal dysfunction is going to make diuresis very difficult.  - Will carefully replace K.  - Low dose midodrine to keep BP up some for renal perfusion and diuresis.  2. AKI on CKD stage IV: In setting of both GI bleeding and volume overload.  Followed by Dr. Jimmy Footman as outpatient.  BUN/creatinine up to 161/3.57 at admission, several weeks ago 92/2.61. Upper GI bleeding may be playing a role in elevated BUN as well.  She is drowsy, suspect uremia is playing a role.  Dialysis will likely be difficult for her given significant RV failure and baseline soft BP.   - Will add low dose midodrine.   - Follow BUN/creatinine closely with diuresis.  - Will need nephrology input, consult today.  3. GI bleed: Recurrent upper GI bleeding of uncertain etiology.  She is not on anticoagulation. She was admitted with hgb 5.4, got 2 units PRBCs today. EGD and capsule study unrevealing in 1/20. GI has seen.  - I do not think that she is stable enough for colonoscopy at this point with significant volume overload,  soft BP, and suspected uremia.  May be reasonable to consider after  further stabilization.  4. Atrial fibrillation: Chronic, s/p AV nodal ablation with St Jude PPM.  She is not anticoagulated due to recurrent GI bleeding.  5. Right PleurX catheter: Qod drainage.   DNR/DNI  Loralie Champagne 01/01/2019 2:42 PM

## 2019-01-01 NOTE — Progress Notes (Signed)
   12/31/18 2144  Vitals  Temp 98 F (36.7 C)  Temp Source Oral  BP (!) 98/48  MAP (mmHg) (!) 63  BP Location Right Arm  BP Method Automatic  Patient Position (if appropriate) Lying  Pulse Rate 99  Pulse Rate Source Monitor  ECG Heart Rate 97  Cardiac Rhythm Ventricular paced  Resp 14  Oxygen Therapy  SpO2 100 %  O2 Device Room Air  Height and Weight  Height 5' 6"  (1.676 m)  Weight 112.2 kg  Type of Scale Used Bed  BSA (Calculated - sq m) 2.29 sq meters  BMI (Calculated) 39.94  Weight in (lb) to have BMI = 25 154.6  MEWS Score  MEWS RR 0  MEWS Pulse 0  MEWS Systolic 1  MEWS LOC 0  MEWS Temp 0  MEWS Score 1  MEWS Score Color Green    Pt received to 5w 27. Pt Oriented to Unit and falls education completed. Pt Verbalizes understanding associated with falls. Skin assessed with following concerns noted:    1. Cellulitis to BLE     2  Dime shaped open blisters to LLE      3. MASD to breasts      4.Bruises to BUE To monitor and treat per MD and nursing orders

## 2019-01-01 NOTE — Consult Note (Signed)
[facility]  HISTORY AND PHYSICAL  [name] [name] is an 73 y.o. female.    Chief Complaint: symptomatic anemia  HPI: Pt is a 52F with a PMH significant for systolic CHF with EF 21-30% and RV dysfunction, Afib s/p PPM, HTN, CKD IV, and recurrent symptomatic anemia who is now seen in consultation at the requset of Dr, Bonner Puna and Dr. Aundra Dubin for evaluation and recs re: acute on chronic renal failure.  Pt's baseline creatinine appears to be anywhere from 2.7-3.0 this past year.  She has cardiorenal syndrome with multiple admissions for symptomatic anemia and volume overload.  She is followed by Dr. Jimmy Footman and was last seen on 12/31/2018.  She was found to be volume overloaded with Na of 125, K 3.3, BUN 159, Cr 3.36, and Hgb 5.4.  In this setting she was referred for admission through the ED.   In the ED, was hypotensive 80s/ 60s.  Received a 500 mL NS bolus. DRE was noted to have melena.     She has received 2 u pRBCS so far.  She has had a capsule endoscopy and a upper GI 09/2018 for the same thing which were not found to have any source of bleeding.  Pt reports some SOB.  A little sleepier than usual, she says.  No f/c, n/v, dysgeusia.  Has a Pleurx tube which is drained QOD.    PMH: Past Medical History:  Diagnosis Date  . Anemia   . Atrial fibrillation ([name] [name])   . Cataract   . Cervical cancer ([place]) 1991   s/p hysterectomy  . Chronic cellulitis   . Chronic combined systolic and diastolic congestive heart failure, NYHA class 2 (HCC)    LVEF 25-30% with restrictive diastolic filling  . Degenerative joint disease   . Diverticulitis   . Essential hypertension   . GERD (gastroesophageal reflux disease)   . Gout   . Kidney stones   . Morbid obesity ([name] [name])   . NICM (nonischemic cardiomyopathy) ([name] [name]) 02/22/2017  . Osteoarthritis   . Permanent atrial fibrillation 05/04/2009   Qualifier: History of  By: Quentin Cornwall CMA, Janett Billow    . 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 Sinoatrial node dysfunction [name] [name])    Status post PPM - Dr. Lovena Le  . Sleep apnea    CPAP   PSH: Past Surgical History:  Procedure Laterality Date  . ABDOMINAL HYSTERECTOMY  1991  . BIOPSY  09/26/2018   Procedure: BIOPSY;  Surgeon: Lavena Bullion, DO;  Location: [place];  Service: Gastroenterology;;  . CHEST TUBE INSERTION Right 11/11/2018   Procedure: INSERTION PLEURAL DRAINAGE CATHETER;  Surgeon: Ivin Poot, MD;  Location: [facility];  Service: Thoracic;  Laterality: Right;  . COLONOSCOPY  1998   one polyp per patient  . ESOPHAGOGASTRODUODENOSCOPY (EGD) WITH PROPOFOL N/A 09/26/2018   Procedure: ESOPHAGOGASTRODUODENOSCOPY (EGD) WITH PROPOFOL;  Surgeon: Lavena Bullion, DO;  Location: [facility];  Service: Gastroenterology;  Laterality: N/A;  . EYE SURGERY    . GIVENS CAPSULE STUDY N/A 10/09/2018   Procedure: GIVENS CAPSULE STUDY;  Surgeon: Carol Ada, MD;  Location: [facility];  Service: Endoscopy;  Laterality: N/A;  . IR THORACENTESIS ASP PLEURAL SPACE W/IMG GUIDE  10/09/2018  . PACEMAKER INSERTION  Nov 2000   St Jude with revision in 2011  . PERCUTANEOUS NEPHROLITHOTOMY  April 2012  . RIGHT HEART CATH N/A 06/28/2018   Procedure: RIGHT HEART CATH;  Surgeon: Jolaine Artist, MD;  Location: [facility];  Service: Cardiovascular;  Laterality: N/A;  . TEE WITHOUT CARDIOVERSION N/A 06/28/2018   Procedure: TRANSESOPHAGEAL ECHOCARDIOGRAM (TEE);  Surgeon: Jolaine Artist, MD;  Location: [name];  Service: Cardiovascular;  Laterality: N/A;    Past Medical History:  Diagnosis Date  . Anemia   . Atrial fibrillation ([place])   . Cataract   . Cervical cancer ([name] [name]) 1991   s/p hysterectomy  . Chronic cellulitis   . Chronic combined systolic and diastolic congestive heart failure, NYHA class 2 (HCC)    LVEF 25-30% with restrictive diastolic filling  . Degenerative joint disease   . Diverticulitis    . Essential hypertension   . GERD (gastroesophageal reflux disease)   . Gout   . Kidney stones   . Morbid obesity ([place])   . NICM (nonischemic cardiomyopathy) ([name] [name]) 02/22/2017  . Osteoarthritis   . Permanent atrial fibrillation 05/04/2009   Qualifier: History of  By: Quentin Cornwall CMA, Janett Billow    . 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 Sinoatrial node dysfunction [name] [name])    Status post PPM - Dr. Lovena Le  . Sleep apnea    CPAP    Medications:   Scheduled: . sodium chloride   Intravenous Once  . midodrine  5 mg Oral TID WC  . pantoprazole  40 mg Oral BID  . sodium chloride flush  3 mL Intravenous Q12H  . sodium chloride flush  3 mL Intravenous Q12H    Medications Prior to Admission  Medication Sig Dispense Refill  . allopurinol (ZYLOPRIM) 300 MG tablet Take 300 mg by mouth every evening.     [name] [name] aspirin EC 325 MG tablet Take 325 mg by mouth every other day.     . calcitRIOL (ROCALTROL) 0.25 MCG capsule Take 0.25 mcg by mouth every other day.     . COD LIVER OIL PO Take 1 capsule by mouth daily.     [name] [name] docusate sodium (COLACE) 100 MG capsule Take 100 mg by mouth daily as needed for mild constipation.     . ferrous sulfate 325 (65 FE) MG tablet Take 325 mg by mouth at bedtime.     [name] [name] HYDROcodone-acetaminophen (NORCO) 10-325 MG tablet Take 1 tablet by mouth See admin instructions. Take 1 tablet by mouth two times a day and an additional 0.5 tablet two times a day as needed for pain 30 tablet 0  . isosorbide mononitrate (IMDUR) 30 MG 24 hr tablet Take 0.5 tablets (15 mg total) by mouth daily. (Patient taking differently: Take 15 mg by mouth at bedtime. ) 90 tablet 2  . metolazone (ZAROXOLYN) 2.5 MG tablet Take 2.5 mg by mouth See admin instructions. Take 1 tablet by mouth every Monday, Wednesday, Friday, Saturday.    . polyethylene glycol (MIRALAX / GLYCOLAX) packet Take 17 g by mouth at bedtime.    . potassium chloride SA  (K-DUR,KLOR-CON) 20 MEQ tablet Take 1 tablet (20 mEq total) by mouth 3 (three) times a week. Taking 86mq twice daily = 479m on Monday, Wednesday, Friday. (Patient taking differently: Take 20-40 mEq by mouth See admin instructions. Take 2 tablets in the morning and 1 tablet by mouth in the evening on Monday, Wednesday, Friday, Saturday) 60 tablet 2  . torsemide (DEMADEX) 20 MG tablet Take 4 tablets (80 mg total) by mouth 2 (two) times daily. May also take 1 tablet (20 mg total)  as needed (for weight greater than 246). (Patient taking differently: Take 80 mg by mouth two times a day and an additional 20 mg as needed for a weight gain >246) 270 tablet 7  . vitamin B-12 (CYANOCOBALAMIN) 500 MCG tablet Take 500 mcg by mouth daily.    . feeding supplement (BOOST HIGH PROTEIN) LIQD Take 1 Container by mouth 2 (two) times daily.     . methocarbamol (ROBAXIN) 500 MG tablet Take 1 tablet (500 mg total) by mouth every 6 (six) hours as needed for muscle spasms. (Patient not taking: Reported on 12/31/2018) 30 tablet 0  . Misc. Devices MISC 1 each by Does not apply route at bedtime. C-PAP    . pantoprazole (PROTONIX) 40 MG tablet Take 1 tablet (40 mg total) by mouth 2 (two) times daily. (Patient not taking: Reported on 12/31/2018) 60 tablet 5    ALLERGIES:   Allergies  Allergen Reactions  . Beta Adrenergic Blockers Anaphylaxis and Other (See Comments)    "DEATH" (Bradycardia and her organs shut down)  . Cephalexin Shortness Of Breath  . Codeine Anaphylaxis and Swelling    Throat swelling  . Contrast Media [Iodinated Diagnostic Agents] Other (See Comments)    Patient states "I have chronic kidney disease, so the doctor said no dye in my veins."  . Coreg [Carvedilol] Other (See Comments)    Beta Blockers cause her organs to shut down (per patient)  . Peppermint Flavor Shortness Of Breath  . Prednisone Anaphylaxis, Shortness Of Breath and Swelling    Throat swells   . Tape Other (See Comments)    States  plastic tape blisters her skin  . Ciprofloxacin Hives    Tolerating levofloxin   . Latex Itching and Rash  . Penicillins Hives    DID THE REACTION INVOLVE: Swelling of the face/tongue/throat, SOB, or low BP? Yes Sudden or severe rash/hives, skin peeling, or the inside of the mouth or nose? Yes Did it require medical treatment? Pt was in [name] at the time of reaction When did it last happen?Was in her mid-40's If all above answers are "NO", may proceed with cephalosporin use.  . Iron Other (See Comments)    Per Patient- Felt like skin was crawling, foaming at mouth, and felt like she was going "crazy"  . Diamox [Acetazolamide] Rash    Rash after 2 doses of diamox     FAM HX: Family History  Problem Relation Age of Onset  . Heart attack Father   . Stroke Mother   . Diabetes Sister   . Colon cancer Other        seven family members  . Liver disease Neg Hx   . Other Neg Hx     Social History:   reports that she quit smoking about 39 years ago. Her smoking use included cigarettes. She smoked 1.00 pack per day. She has never used smokeless tobacco. She reports that she does not drink alcohol or use drugs.  ROS: ROS: all other systems reviewed and are negative except as per HPI  Blood pressure (!) 104/57, pulse 92, temperature 97.8 F (36.6 C), temperature source Oral, resp. rate (!) 21, height 5' 6"  (1.676 m), weight 112.2 kg, SpO2 98 %. PHYSICAL EXAM: Physical Exam  GEN older woman, NAD HEENT EOMI PERRL NECK + JVD to mandible PULM R Pleurx tube in place, mildly increased WOB CV RRR, + RV heave, + S3 ABD distended EXT 2+ LE edema NEURO AAO x 3, very slight asterixis  SKIN  burnished over LEs, healing blisters with eschar LLE    Results for orders placed or performed during the [name] encounter of 12/31/18 (from the past 48 hour(s))  Comprehensive metabolic panel     Status: Abnormal   Collection Time: 12/31/18  5:32 PM  Result Value Ref Range   Sodium 127 (L) 135 -  145 mmol/L   Potassium 3.4 (L) 3.5 - 5.1 mmol/L   Chloride 86 (L) 98 - 111 mmol/L   CO2 27 22 - 32 mmol/L   Glucose, Bld 162 (H) 70 - 99 mg/dL   BUN 161 (H) 8 - 23 mg/dL   Creatinine, Ser 3.57 (H) 0.44 - 1.00 mg/dL   Calcium 8.6 (L) 8.9 - 10.3 mg/dL   Total Protein 5.5 (L) 6.5 - 8.1 g/dL   Albumin 2.8 (L) 3.5 - 5.0 g/dL   AST 21 15 - 41 U/L   ALT 14 0 - 44 U/L   Alkaline Phosphatase 59 38 - 126 U/L   Total Bilirubin 0.6 0.3 - 1.2 mg/dL   GFR calc non Af Amer 12 (L) >60 mL/min   GFR calc Af Amer 14 (L) >60 mL/min   Anion gap 14 5 - 15    Comment: Performed at [name], [name], [place] [address]  CBC     Status: Abnormal   Collection Time: 12/31/18  5:32 PM  Result Value Ref Range   WBC 6.0 4.0 - 10.5 K/uL   RBC 1.93 (L) 3.87 - 5.11 MIL/uL   Hemoglobin 5.3 (LL) 12.0 - 15.0 g/dL    Comment: REPEATED TO VERIFY THIS CRITICAL RESULT HAS VERIFIED AND BEEN CALLED TO K.CARTER RN BY KATHERINE MCCORMICK ON 04 07 2020 AT 1803, AND HAS BEEN READ BACK.     HCT 18.0 (L) 36.0 - 46.0 %   MCV 93.3 80.0 - 100.0 fL   MCH 27.5 26.0 - 34.0 pg   MCHC 29.4 (L) 30.0 - 36.0 g/dL   RDW 16.3 (H) 11.5 - 15.5 %   Platelets 173 150 - 400 K/uL   nRBC 0.0 0.0 - 0.2 %    Comment: Performed at [facility] [address], [place], [place] [address]  Type and screen [name]     Status: None (Preliminary result)   Collection Time: 12/31/18  5:32 PM  Result Value Ref Range   ABO/RH(D) O POS    Antibody Screen POS    Sample Expiration 01/03/2019    Antibody Identification ANTI e    Unit Number [id number]    Blood Component Type RED CELLS,LR    Unit division 00    Status of Unit ISSUED    Donor AG Type      NEGATIVE FOR C ANTIGEN NEGATIVE FOR e ANTIGEN NEGATIVE FOR KIDD A ANTIGEN   Transfusion Status OK TO TRANSFUSE    Crossmatch Result COMPATIBLE    Unit Number [id number]    Blood Component Type RED CELLS,LR    Unit division 00    Status of  Unit ISSUED    Donor AG Type      NEGATIVE FOR C ANTIGEN NEGATIVE FOR e ANTIGEN NEGATIVE FOR KIDD A ANTIGEN   Transfusion Status OK TO TRANSFUSE    Crossmatch Result COMPATIBLE   Vitamin B12     Status: Abnormal   Collection Time: 12/31/18  6:28 PM  Result Value Ref Range   Vitamin B-12 3,640 (H) 180 - 914 pg/mL    Comment: (  NOTE) This assay is not validated for testing neonatal or myeloproliferative syndrome specimens for Vitamin B12 levels. Performed at [name], [place] [address], [place], [name] [address]   Folate     Status: None   Collection Time: 12/31/18  6:28 PM  Result Value Ref Range   Folate 38.1 >5.9 ng/mL    Comment: Performed at [name], [place] [address], [name], [place] [address]  Iron and TIBC     Status: Abnormal   Collection Time: 12/31/18  6:28 PM  Result Value Ref Range   Iron 13 (L) 28 - 170 ug/dL   TIBC 521 (H) 250 - 450 ug/dL   Saturation Ratios 2 (L) 10.4 - 31.8 %   UIBC 508 ug/dL    Comment: Performed at [name], [place] [address], [place], [place] [address]  Ferritin     Status: None   Collection Time: 12/31/18  6:28 PM  Result Value Ref Range   Ferritin 40 11 - 307 ng/mL    Comment: Performed at [name], [place] [address] [place], [place] [address]  Reticulocytes     Status: Abnormal   Collection Time: 12/31/18  6:28 PM  Result Value Ref Range   Retic Ct Pct 6.3 (H) 0.4 - 3.1 %   RBC. 2.01 (L) 3.87 - 5.11 MIL/uL   Retic Count, Absolute 127.2 19.0 - 186.0 K/uL   Immature Retic Fract 18.4 (H) 2.3 - 15.9 %    Comment: Performed at [facility] [address], [place], [place] [address]  Magnesium     Status: Abnormal   Collection Time: 12/31/18  6:28 PM  Result Value Ref Range   Magnesium 3.2 (H) 1.7 - 2.4 mg/dL    Comment: Performed at [facility] [address] [place], [place] [address]  Differential     Status: Abnormal   Collection Time: 12/31/18  6:28 PM  Result Value Ref Range    Neutrophils Relative % 81 %   Neutro Abs 6.1 1.7 - 7.7 K/uL   Lymphocytes Relative 9 %   Lymphs Abs 0.7 0.7 - 4.0 K/uL   Monocytes Relative 7 %   Monocytes Absolute 0.6 0.1 - 1.0 K/uL   Eosinophils Relative 2 %   Eosinophils Absolute 0.1 0.0 - 0.5 K/uL   Basophils Relative 0 %   Basophils Absolute 0.0 0.0 - 0.1 K/uL   Immature Granulocytes 1 %   Abs Immature Granulocytes 0.08 (H) 0.00 - 0.07 K/uL    Comment: Performed at [facility] [address] [place], [place] [address]  Prepare RBC     Status: None   Collection Time: 12/31/18  6:43 PM  Result Value Ref Range   Order Confirmation      ORDER PROCESSED BY BLOOD BANK Performed at [name], [place] [address] [place], [place] [address]   POC occult blood, ED     Status: Abnormal   Collection Time: 12/31/18  8:01 PM  Result Value Ref Range   Fecal Occult Bld POSITIVE (A) NEGATIVE  Urinalysis, Complete w Microscopic     Status: Abnormal   Collection Time: 12/31/18  8:15 PM  Result Value Ref Range   Color, Urine STRAW (A) YELLOW   APPearance CLEAR CLEAR   Specific Gravity, Urine 1.009 1.005 - 1.030   pH 6.0 5.0 - 8.0   Glucose, UA NEGATIVE NEGATIVE mg/dL   Hgb urine dipstick SMALL (A)  NEGATIVE   Bilirubin Urine NEGATIVE NEGATIVE   Ketones, ur NEGATIVE NEGATIVE mg/dL   Protein, ur NEGATIVE NEGATIVE mg/dL   Nitrite NEGATIVE NEGATIVE   Leukocytes,Ua MODERATE (A) NEGATIVE   RBC / HPF 0-5 0 - 5 RBC/hpf   WBC, UA 6-10 0 - 5 WBC/hpf   Bacteria, UA RARE (A) NONE SEEN   Squamous Epithelial / LPF 0-5 0 - 5    Comment: Performed at [name], [place] [address] [place], [place] [address]  Sodium, urine, random     Status: None   Collection Time: 12/31/18  8:15 PM  Result Value Ref Range   Sodium, Ur 24 mmol/L    Comment: Performed at [facility] [address], [place], [place] [address]  Osmolality, urine     Status: None   Collection Time: 12/31/18  8:15 PM  Result Value Ref Range   Osmolality, Ur  321 300 - 900 mOsm/kg    Comment: Performed at [facility] [address] [place], [place] [address]  Creatinine, urine, random     Status: None   Collection Time: 12/31/18  8:15 PM  Result Value Ref Range   Creatinine, Urine 33.29 mg/dL    Comment: Performed at [facility] [address] [place], [place] [address]  MRSA PCR Screening     Status: None   Collection Time: 12/31/18  9:58 PM  Result Value Ref Range   MRSA by PCR NEGATIVE NEGATIVE    Comment:        The GeneXpert MRSA Assay (FDA approved for NASAL specimens only), is one component of a comprehensive MRSA colonization surveillance program. It is not intended to diagnose MRSA infection nor to guide or monitor treatment for MRSA infections. Performed at [name], [place] [address], [place], [place] [address]   Troponin I - ONCE - STAT     Status: Abnormal   Collection Time: 12/31/18 10:38 PM  Result Value Ref Range   Troponin I 0.04 (HH) <0.03 ng/mL    Comment: CRITICAL RESULT CALLED TO, READ BACK BY AND VERIFIED WITH: NGENO,W RN 01/01/2019 0004 JORDANS Performed at [name], [place] [address], [place], [place] [address]   Protime-INR     Status: None   Collection Time: 12/31/18 10:38 PM  Result Value Ref Range   Prothrombin Time 14.2 11.4 - 15.2 seconds   INR 1.1 0.8 - 1.2    Comment: (NOTE) INR goal varies based on device and disease states. Performed at [name], [place] [address] [place], [place] [address]   Troponin I - Once-Timed     Status: Abnormal   Collection Time: 01/01/19  6:29 AM  Result Value Ref Range   Troponin I 0.04 (HH) <0.03 ng/mL    Comment: CRITICAL VALUE NOTED.  VALUE IS CONSISTENT WITH PREVIOUSLY REPORTED AND CALLED VALUE. Performed at [name], [place] [address] [place],  [address]   Basic metabolic panel     Status: Abnormal   Collection Time: 01/01/19  6:29 AM  Result Value Ref Range   Sodium 130 (L) 135 - 145 mmol/L    Potassium 2.7 (LL) 3.5 - 5.1 mmol/L    Comment: CRITICAL RESULT CALLED TO, READ BACK BY AND VERIFIED WITH: Natasha Bence [id number] WILDERK    Chloride 89 (L) 98 - 111 mmol/L   CO2 27 22 - 32 mmol/L   Glucose, Bld 134 (H) 70 - 99  mg/dL   BUN 160 (H) 8 - 23 mg/dL   Creatinine, Ser 3.39 (H) 0.44 - 1.00 mg/dL   Calcium 8.5 (L) 8.9 - 10.3 mg/dL   GFR calc non Af Amer 13 (L) >60 mL/min   GFR calc Af Amer 15 (L) >60 mL/min   Anion gap 14 5 - 15    Comment: Performed at [facility] [name], [place] [address]  CBC     Status: Abnormal   Collection Time: 01/01/19  7:35 AM  Result Value Ref Range   WBC 4.7 4.0 - 10.5 K/uL   RBC 2.12 (L) 3.87 - 5.11 MIL/uL   Hemoglobin 5.8 (LL) 12.0 - 15.0 g/dL    Comment: This critical result has verified and been called to RIVERA,D RN by Elaina Pattee on 04 08 2020 at [place], and has been read back.    HCT 19.3 (L) 36.0 - 46.0 %   MCV 91.0 80.0 - 100.0 fL   MCH 27.4 26.0 - 34.0 pg   MCHC 30.1 30.0 - 36.0 g/dL   RDW 16.0 (H) 11.5 - 15.5 %   Platelets 158 150 - 400 K/uL   nRBC 0.0 0.0 - 0.2 %    Comment: Performed at [facility] [address], [place], [place] [address]  Basic metabolic panel     Status: Abnormal   Collection Time: 01/01/19  1:59 PM  Result Value Ref Range   Sodium 130 (L) 135 - 145 mmol/L   Potassium 2.9 (L) 3.5 - 5.1 mmol/L   Chloride 88 (L) 98 - 111 mmol/L   CO2 26 22 - 32 mmol/L   Glucose, Bld 176 (H) 70 - 99 mg/dL   BUN 158 (H) 8 - 23 mg/dL   Creatinine, Ser 3.34 (H) 0.44 - 1.00 mg/dL   Calcium 8.5 (L) 8.9 - 10.3 mg/dL   GFR calc non Af Amer 13 (L) >60 mL/min   GFR calc Af Amer 15 (L) >60 mL/min   Anion gap 16 (H) 5 - 15    Comment: Performed at [facility] [address] [place], [place] [address]  Hemoglobin and hematocrit, blood     Status: Abnormal   Collection Time: 01/01/19  1:59 PM  Result Value Ref Range   Hemoglobin 6.8 (LL) 12.0 - 15.0 g/dL    Comment: CRITICAL VALUE  NOTED.  VALUE IS CONSISTENT WITH PREVIOUSLY REPORTED AND CALLED VALUE. REPEATED TO VERIFY    HCT 22.3 (L) 36.0 - 46.0 %    Comment: Performed at [name], [place] [address], [place], [place] [address]  Prepare RBC     Status: None   Collection Time: 01/01/19  2:45 PM  Result Value Ref Range   Order Confirmation      ORDER PROCESSED BY BLOOD BANK Performed at [name], [place] [address], [place], [place] [address]     Dg Chest Portable 1 View  Result Date: 12/31/2018 CLINICAL DATA:  Anemia. Epigastric region pain. History of atrial fibrillation EXAM: PORTABLE CHEST 1 VIEW COMPARISON:  December 02, 2018 FINDINGS: There is scarring in the lateral right base. There is no appreciable edema or consolidation. There is generalized cardiomegaly, stable. Pulmonary vascularity is normal. Pacemaker leads attached to the right ventricle. There is aortic atherosclerosis. No adenopathy. No pneumothorax. No bone lesions. IMPRESSION: Scarring lateral right base. No edema or consolidation. Generalized cardiomegaly. Pacemaker lead attached to right ventricle. Aortic Atherosclerosis (ICD10-I70.0). Electronically Signed  By: Lowella Grip III M.D.   On: 12/31/2018 21:31    Assessment/Plan  1.  AKI on CKD IV: Followed by Dr. Jimmy Footman, last seen 4/7 where she was found to be vol overloaded.  AKI likely due to anemia and hypotension; CKD IV d/t cardiorenal syndrome.  I think she has some slight uremic symptoms although BUN is not as high as it's been in the past- 188-190 is the highest it's been.  BUN elevated this time likely d/t GIB; it can also be quite high in individuals with decompensated CHF.  Hopefully with pRBCs and augmenting BP we can correct some of the problem.  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  I agree with the addition of midodrine to augment BP and high-dose Lasix as ordered by AHF.  If things don't  improve from  a volume status and renal standpoint, may be appropriate to get palliative care involved.    2.  Hypotension: midodrine as above  3.  GIB: + melanotic stools, GI consulted, holding off on c-scope until hemodynamics improved  4.  Acute on chronic systolic CHF: high-dose Lasix 120 mg BID per AHF.  5.  Recurrent R pleural effusion: Pleurx drainage QOD    6.  Dispo: pending improvement of Hgb, hemodynamics, and AKI.  Madelon Lips 01/01/2019, 3:20 PM

## 2019-01-01 NOTE — Progress Notes (Signed)
Pt alert and oriented x4, pt completed 1 units of PRBCs , Hg at this time is 6.8. MD aware  and order one more unit to be given. At this time were wating for PRBC to be deliver to the unit. Pt is stable at this time Vitals : BP   104/57, HR 92, rr 21, Temp 98.1, oxygen sat 98% on 2L N/C.

## 2019-01-02 DIAGNOSIS — Z515 Encounter for palliative care: Secondary | ICD-10-CM

## 2019-01-02 DIAGNOSIS — R531 Weakness: Secondary | ICD-10-CM

## 2019-01-02 DIAGNOSIS — Z66 Do not resuscitate: Secondary | ICD-10-CM

## 2019-01-02 LAB — CBC
HCT: 19.3 % — ABNORMAL LOW (ref 36.0–46.0)
HCT: 25.2 % — ABNORMAL LOW (ref 36.0–46.0)
Hemoglobin: 5.8 g/dL — CL (ref 12.0–15.0)
Hemoglobin: 7.7 g/dL — ABNORMAL LOW (ref 12.0–15.0)
MCH: 27.4 pg (ref 26.0–34.0)
MCH: 26.9 pg (ref 26.0–34.0)
MCHC: 30.1 g/dL (ref 30.0–36.0)
MCHC: 30.6 g/dL (ref 30.0–36.0)
MCV: 91 fL (ref 80.0–100.0)
MCV: 88.1 fL (ref 80.0–100.0)
Platelets: 158 10*3/uL (ref 150–400)
Platelets: 172 10*3/uL (ref 150–400)
RBC: 2.12 MIL/uL — ABNORMAL LOW (ref 3.87–5.11)
RBC: 2.86 MIL/uL — ABNORMAL LOW (ref 3.87–5.11)
RDW: 16 % — ABNORMAL HIGH (ref 11.5–15.5)
RDW: 15.8 % — ABNORMAL HIGH (ref 11.5–15.5)
WBC: 4.7 10*3/uL (ref 4.0–10.5)
WBC: 6.8 10*3/uL (ref 4.0–10.5)
nRBC: 0 % (ref 0.0–0.2)
nRBC: 0 % (ref 0.0–0.2)

## 2019-01-02 LAB — BASIC METABOLIC PANEL
Anion gap: 15 (ref 5–15)
BUN: 150 mg/dL — ABNORMAL HIGH (ref 8–23)
CO2: 27 mmol/L (ref 22–32)
Calcium: 8.4 mg/dL — ABNORMAL LOW (ref 8.9–10.3)
Chloride: 90 mmol/L — ABNORMAL LOW (ref 98–111)
Creatinine, Ser: 3.13 mg/dL — ABNORMAL HIGH (ref 0.44–1.00)
GFR calc Af Amer: 16 mL/min — ABNORMAL LOW (ref >60)
GFR calc non Af Amer: 14 mL/min — ABNORMAL LOW (ref >60)
Glucose, Bld: 106 mg/dL — ABNORMAL HIGH (ref 70–99)
Potassium: 2.7 mmol/L — CL (ref 3.5–5.1)
Sodium: 132 mmol/L — ABNORMAL LOW (ref 135–145)

## 2019-01-02 LAB — UREA NITROGEN, URINE: Urea Nitrogen, Ur: 393 mg/dL

## 2019-01-02 LAB — POTASSIUM: Potassium: 2.9 mmol/L — ABNORMAL LOW (ref 3.5–5.1)

## 2019-01-02 MED ORDER — SENNOSIDES-DOCUSATE SODIUM 8.6-50 MG PO TABS
1.0000 | ORAL_TABLET | Freq: Two times a day (BID) | ORAL | Status: DC
  Administered 2019-01-02 – 2019-01-05 (×7): 1 via ORAL
  Filled 2019-01-02 (×7): qty 1

## 2019-01-02 MED ORDER — OXYCODONE-ACETAMINOPHEN 5-325 MG PO TABS: 1.0000 | ORAL_TABLET | Freq: Four times a day (QID) | ORAL | Status: DC | PRN

## 2019-01-02 MED ORDER — HYDROCODONE-ACETAMINOPHEN 5-325 MG PO TABS
1.0000 | ORAL_TABLET | Freq: Two times a day (BID) | ORAL | Status: DC | PRN
  Administered 2019-01-03 – 2019-01-04 (×2): 1 via ORAL
  Filled 2019-01-02: qty 1

## 2019-01-02 MED ORDER — POTASSIUM CHLORIDE CRYS ER 20 MEQ PO TBCR
40.0000 meq | EXTENDED_RELEASE_TABLET | Freq: Once | ORAL | Status: AC
  Administered 2019-01-02: 40 meq via ORAL
  Filled 2019-01-02: qty 2

## 2019-01-02 MED ORDER — HYDROCODONE-ACETAMINOPHEN 10-325 MG PO TABS
1.0000 | ORAL_TABLET | Freq: Two times a day (BID) | ORAL | Status: DC
  Administered 2019-01-02 – 2019-01-05 (×7): 1 via ORAL
  Filled 2019-01-02 (×8): qty 1

## 2019-01-02 MED ORDER — FENTANYL CITRATE (PF) 100 MCG/2ML IJ SOLN: 12.5000 ug | INTRAMUSCULAR | Status: DC | PRN

## 2019-01-02 MED ORDER — POLYETHYLENE GLYCOL 3350 17 G PO PACK
17.0000 g | PACK | Freq: Every day | ORAL | Status: DC | PRN
  Administered 2019-01-02: 17 g via ORAL
  Filled 2019-01-02: qty 1

## 2019-01-02 NOTE — Progress Notes (Signed)
[facility] Progress Note    Assessment/ Plan:   1.  AKI on CKD IV: Followed by Dr. Jimmy Footman, last seen 4/7 as OP where she was found to be vol overloaded.  AKI likely due to anemia and hypotension; CKD IV d/t cardiorenal syndrome.  I think she has some slight uremic symptoms although BUN is not as high as it's been in the past- 188-190 is the highest it's been.  BUN elevated this time likely d/t GIB; it can also be quite high in individuals with decompensated CHF.  pRBCs and augmenting BP along with Lasix has started to improve her Cr/ BUN and hemodynamics.  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  I agree with the addition of midodrine to augment BP and high-dose Lasix as ordered by AHF.  Appreciate palliative care c/s.    2.  Hypotension: midodrine as above  3.  GIB: + melanotic stools, GI consulted, holding off on c-scope until hemodynamics improved  4.  Acute on chronic systolic CHF: high-dose Lasix 120 mg BID per AHF.  5.  Recurrent R pleural effusion: Pleurx drainage QOD -- today is the day to drain and would probably not do more than 750-1L-- this will cause fluid shifts as well and we could see hypotension and second insult of AKI.    6.  Dispo: pending improvement of Hgb, hemodynamics, and AKI.  Subjective:    UOP, hemodynamics, and hgb better with interventions yesterday.  Today is day to drain Pleurx per pt.     Objective:   BP (!) 110/46   Pulse (!) 101   Temp 98.7 F (37.1 C) (Oral)   Resp (!) 24   Ht 5' 6"  (1.676 m)   Wt 110.6 kg   SpO2 98%   BMI 39.35 kg/m   Intake/Output Summary (Last 24 hours) at 01/02/2019 0944 Last data filed at 01/02/2019 0600 Gross per 24 hour  Intake 565.33 ml  Output 2050 ml  Net -1484.67 ml   Weight change: -1.6 kg  Physical Exam: GEN older woman, NAD HEENT EOMI PERRL NECK + JVD to mandible, unchanged. PULM R Pleurx tube in place, mildly  increased WOB CV RRR, + RV heave, + S3 ABD distended EXT 2+ LE edema, unchanged NEURO AAO x 3, very slight asterixis  SKIN burnished over LEs, healing blisters with eschar LLE  Imaging: Dg Chest Portable 1 View  Result Date: 12/31/2018 CLINICAL DATA:  Anemia. Epigastric region pain. History of atrial fibrillation EXAM: PORTABLE CHEST 1 VIEW COMPARISON:  December 02, 2018 FINDINGS: There is scarring in the lateral right base. There is no appreciable edema or consolidation. There is generalized cardiomegaly, stable. Pulmonary vascularity is normal. Pacemaker leads attached to the right ventricle. There is aortic atherosclerosis. No adenopathy. No pneumothorax. No bone lesions. IMPRESSION: Scarring lateral right base. No edema or consolidation. Generalized cardiomegaly. Pacemaker lead attached to right ventricle. Aortic Atherosclerosis (ICD10-I70.0). Electronically Signed   By: Lowella Grip III M.D.   On: 12/31/2018 21:31    Labs: BMET Recent Labs  Lab 12/31/18 1732 01/01/19 0629 01/01/19 1359 01/02/19 0248  NA 127* 130* 130* 132*  K 3.4* 2.7* 2.9* 2.7*  CL 86* 89* 88* 90*  CO2 27 27 26 27   GLUCOSE 162* 134* 176* 106*  BUN 161* 160* 158* 150*  CREATININE 3.57* 3.39* 3.34* 3.13*  CALCIUM 8.6* 8.5* 8.5* 8.4*   CBC Recent Labs  Lab 12/31/18 1732 12/31/18 1828 01/01/19 0735 01/01/19  1359 01/02/19 0248  WBC 6.0  --  4.7  --  6.8  NEUTROABS  --  6.1  --   --   --   HGB 5.3*  --  5.8* 6.8* 7.7*  HCT 18.0*  --  19.3* 22.3* 25.2*  MCV 93.3  --  91.0  --  88.1  PLT 173  --  158  --  172    Medications:    . sodium chloride   Intravenous Once  . midodrine  5 mg Oral TID WC  . pantoprazole  40 mg Oral BID  . potassium chloride  40 mEq Oral Once  . sodium chloride flush  3 mL Intravenous Q12H  . sodium chloride flush  3 mL Intravenous Q12H      Madelon Lips, MD [place] pgr [phone] 01/02/2019, 9:44 AM

## 2019-01-02 NOTE — Progress Notes (Signed)
Patient ID: [name] [name], female   DOB: 02-Aug-1946, 73 y.o.   MRN: [id number]  PROGRESS NOTE    [name] [name]  MLY:[id number] DOB: 04-10-1946 DOA: 12/31/2018 PCP: [name], MD   Brief Narrative:  73 year old female with history of chronic combined CHF, chronic renal disease stage IV, hypertension, recurrent right pleural effusion, iron deficiency anemia, recent admission from 12/05/2018-12/06/2018 for anemia presumed GI bleeding for which she required 4 units packed red cells transfusion and largely negative GI work-up presented on 12/31/2018 with worsening fatigue.  Hemoglobin was found to be 5.3 on admission.  She was transfused 2 units of packed red cells.  GI, nephrology and heart failure teams were consulted.  Assessment & Plan:   Principal Problem:   Symptomatic anemia Active Problems:   Essential hypertension   Chronic combined systolic and diastolic CHF (congestive heart failure) (HCC)   Acute renal failure superimposed on stage 4 chronic kidney disease (HCC)   Hyponatremia   Upper GI bleeding  Recurrent symptomatic anemia/iron deficiency anemia most likely from GI bleeding in a patient with anemia of chronic disease -Status post 2 unit packed red cells transfusion during the hospitalization.  Hemoglobin 7.7 today.  Transfuse if hemoglobin is less than 7 -Apparently patient had some type of reaction to IV iron in the past as per the daughter; will hold off for now.  GI bleeding -GI following.  Continue oral Protonix.  Patient is not stable for colonoscopy as per advanced heart failure team.  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  Currently on intravenous Lasix.  Also on oral midodrine for soft blood pressures.  Imdur on hold.  Strict input and output.  Daily weights.  Negative balance of 2211.9 cc since admission.  Acute kidney injury on chronic kidney disease stage IV -Nephrology following.  Creatinine  3.13 today.  Patient is not a good candidate for dialysis.  Generalized deconditioning and overall poor prognosis -Overall condition is very poor.  Will get palliative care consultation for goals of care.  Patient is a DNR  Recurrent right-sided pleural effusion status post right Pleurx catheter placement on 11/11/2018 -Drain Pleurx catheter every other day; due for drainage today  Chronic atrial fibrillation status post AV node ablation with [facility] Jude PPM -Not on anticoagulation due to recurrent GI bleeding.    DVT prophylaxis: SCDs Code Status: DNR Family Communication: None at bedside Disposition Plan: Uncertain  Consultants:  Cardiology/GI/nephrology  Procedures: None Antimicrobials: None   Subjective: Patient seen and examined at bedside.  She denies any worsening fever, nausea, vomiting or shortness of breath.  Complains of constipation.  Objective: Vitals:   01/02/19 0154 01/02/19 0447 01/02/19 0654 01/02/19 0844  BP: (!) 98/47 (!) 110/46    Pulse: (!) 103 (!) 101    Resp: 18 (!) 24    Temp: 98.4 F (36.9 C)   98.7 F (37.1 C)  TempSrc: Oral   Oral  SpO2: 98% 98%    Weight:   110.6 kg   Height:        Intake/Output Summary (Last 24 hours) at 01/02/2019 1021 Last data filed at 01/02/2019 0958 Gross per 24 hour  Intake 787.33 ml  Output 2050 ml  Net -1262.67 ml   Filed Weights   12/31/18 2144 01/02/19 0654  Weight: 112.2 kg 110.6 kg    Examination:  General exam: Appears calm and comfortable.  Looks chronically ill. Respiratory system: Bilateral decreased breath sounds at bases with scattered crackles mostly  in the bases Cardiovascular system: S1 & S2 heard, intermittent tachycardia Gastrointestinal system: Abdomen is nondistended, soft and nontender. Normal bowel sounds heard. Extremities: No cyanosis, clubbing; lower extremity edema present    Data Reviewed: I have personally reviewed following labs and imaging studies  CBC: Recent Labs  Lab  12/31/18 1732 12/31/18 1828 01/01/19 0735 01/01/19 1359 01/02/19 0248  WBC 6.0  --  4.7  --  6.8  NEUTROABS  --  6.1  --   --   --   HGB 5.3*  --  5.8* 6.8* 7.7*  HCT 18.0*  --  19.3* 22.3* 25.2*  MCV 93.3  --  91.0  --  88.1  PLT 173  --  158  --  702   Basic Metabolic Panel: Recent Labs  Lab 12/31/18 1732 12/31/18 1828 01/01/19 0629 01/01/19 1359 01/02/19 0248  NA 127*  --  130* 130* 132*  K 3.4*  --  2.7* 2.9* 2.7*  CL 86*  --  89* 88* 90*  CO2 27  --  27 26 27   GLUCOSE 162*  --  134* 176* 106*  BUN 161*  --  160* 158* 150*  CREATININE 3.57*  --  3.39* 3.34* 3.13*  CALCIUM 8.6*  --  8.5* 8.5* 8.4*  MG  --  3.2*  --   --   --    GFR: Estimated Creatinine Clearance: 20.2 mL/min (A) (by C-G formula based on SCr of 3.13 mg/dL (H)). Liver Function Tests: Recent Labs  Lab 12/31/18 1732  AST 21  ALT 14  ALKPHOS 59  BILITOT 0.6  PROT 5.5*  ALBUMIN 2.8*   No results for input(s): LIPASE, AMYLASE in the last 168 hours. No results for input(s): AMMONIA in the last 168 hours. Coagulation Profile: Recent Labs  Lab 12/31/18 2238  INR 1.1   Cardiac Enzymes: Recent Labs  Lab 12/31/18 2238 01/01/19 0629  TROPONINI 0.04* 0.04*   BNP (last 3 results) No results for input(s): PROBNP in the last 8760 hours. HbA1C: No results for input(s): HGBA1C in the last 72 hours. CBG: No results for input(s): GLUCAP in the last 168 hours. Lipid Profile: No results for input(s): CHOL, HDL, LDLCALC, TRIG, CHOLHDL, LDLDIRECT in the last 72 hours. Thyroid Function Tests: No results for input(s): TSH, T4TOTAL, FREET4, T3FREE, THYROIDAB in the last 72 hours. Anemia Panel: Recent Labs    12/31/18 1828  VITAMINB12 3,640*  FOLATE 38.1  FERRITIN 40  TIBC 521*  IRON 13*  RETICCTPCT 6.3*   Sepsis Labs: No results for input(s): PROCALCITON, LATICACIDVEN in the last 168 hours.  Recent Results (from the past 240 hour(s))  MRSA PCR Screening     Status: None   Collection Time:  12/31/18  9:58 PM  Result Value Ref Range Status   MRSA by PCR NEGATIVE NEGATIVE Final    Comment:        The GeneXpert MRSA Assay (FDA approved for NASAL specimens only), is one component of a comprehensive MRSA colonization surveillance program. It is not intended to diagnose MRSA infection nor to guide or monitor treatment for MRSA infections. Performed at [facility], [place] [address], [place],  [address]          Radiology Studies: Dg Chest Portable 1 View  Result Date: 12/31/2018 CLINICAL DATA:  Anemia. Epigastric region pain. History of atrial fibrillation EXAM: PORTABLE CHEST 1 VIEW COMPARISON:  December 02, 2018 FINDINGS: There is scarring in the lateral right base. There is no appreciable  edema or consolidation. There is generalized cardiomegaly, stable. Pulmonary vascularity is normal. Pacemaker leads attached to the right ventricle. There is aortic atherosclerosis. No adenopathy. No pneumothorax. No bone lesions. IMPRESSION: Scarring lateral right base. No edema or consolidation. Generalized cardiomegaly. Pacemaker lead attached to right ventricle. Aortic Atherosclerosis (ICD10-I70.0). Electronically Signed   By: Lowella Grip III M.D.   On: 12/31/2018 21:31        Scheduled Meds: . sodium chloride   Intravenous Once  . midodrine  5 mg Oral TID WC  . pantoprazole  40 mg Oral BID  . potassium chloride  40 mEq Oral Once  . sodium chloride flush  3 mL Intravenous Q12H  . sodium chloride flush  3 mL Intravenous Q12H   Continuous Infusions: . sodium chloride    . furosemide 120 mg (01/02/19 1008)     LOS: 1 day        Aline August, MD [facility] 01/02/2019, 10:21 AM

## 2019-01-02 NOTE — Progress Notes (Signed)
Note Cardiology says pt not stable enough for Colonoscopy hgb  5.3 >> 6.8 after 3 PRBCs.    Please call GI when, if she is felt stable enough for colonoscopy to wup FOBT + anemia.   As outpt followed by [facility] in [place] but would not be appropriate for outpt colonoscopy.    Azucena Freed PA-C [phone]

## 2019-01-02 NOTE — Progress Notes (Signed)
CRITICAL VALUE ALERT  Critical Value: 2.7 K+  Date & Time Notied:  01/02/19 0352  Provider Notified: Baltazar Najjar   Orders Received/Actions taken: K+ 25mq Ordered PO

## 2019-01-02 NOTE — Progress Notes (Addendum)
Patient ID: [name] [name], female   DOB: 1946-04-12, 73 y.o.   MRN: [id number]     Advanced Heart Failure Rounding Note  PCP-Cardiologist: Dorris Carnes, MD   Subjective:    She is doing better this morning, diuresed well and weight down 4 lbs.  Hgb appropriately increased to 7.7 with 2 units PRBCs on 4/8. She is more alert today.  BUN/creatinine trending downwards but still very high.    Objective:   Weight Range: 110.6 kg Body mass index is 39.35 kg/m.   Vital Signs:   Temp:  [97.8 F (36.6 C)-98.7 F (37.1 C)] 98.7 F (37.1 C) (04/09 0844) Pulse Rate:  [47-103] 101 (04/09 0447) Resp:  [18-24] 24 (04/09 0447) BP: (97-110)/(37-63) 110/46 (04/09 0447) SpO2:  [97 %-100 %] 98 % (04/09 0447) Weight:  [110.6 kg] 110.6 kg (04/09 0654) Last BM Date: (PTA)  Weight change: Filed Weights   12/31/18 2144 01/02/19 0654  Weight: 112.2 kg 110.6 kg    Intake/Output:   Intake/Output Summary (Last 24 hours) at 01/02/2019 1016 Last data filed at 01/02/2019 0958 Gross per 24 hour  Intake 787.33 ml  Output 2050 ml  Net -1262.67 ml      Physical Exam    General: Chronically ill-appearing Neck: JVP 14-16 cm, no thyromegaly or thyroid nodule.  Lungs: Decreased BS bases.  CV: Nonpalpable PMI.  Heart regular S1/S2, no S3/S4, 3/6 HSM LLSB/apex.  1+ ankle edema.   Abdomen: Soft, nontender, no hepatosplenomegaly, no distention.  Skin: Intact without lesions or rashes.  Neurologic: Alert and oriented x 3.  Psych: Normal affect. Extremities: No clubbing or cyanosis.  HEENT: Normal.    Telemetry   Atrial fibrillation with v-pacing in 90s. Personally reviewed.   Labs    CBC Recent Labs    12/31/18 1828 01/01/19 0735 01/01/19 1359 01/02/19 0248  WBC  --  4.7  --  6.8  NEUTROABS 6.1  --   --   --   HGB  --  5.8* 6.8* 7.7*  HCT  --  19.3* 22.3* 25.2*  MCV  --  91.0  --  88.1  PLT  --  158  --  503   Basic Metabolic Panel Recent Labs    12/31/18 1828  01/01/19 1359  01/02/19 0248  NA  --    < > 130* 132*  K  --    < > 2.9* 2.7*  CL  --    < > 88* 90*  CO2  --    < > 26 27  GLUCOSE  --    < > 176* 106*  BUN  --    < > 158* 150*  CREATININE  --    < > 3.34* 3.13*  CALCIUM  --    < > 8.5* 8.4*  MG 3.2*  --   --   --    < > = values in this interval not displayed.   Liver Function Tests Recent Labs    12/31/18 1732  AST 21  ALT 14  ALKPHOS 59  BILITOT 0.6  PROT 5.5*  ALBUMIN 2.8*   No results for input(s): LIPASE, AMYLASE in the last 72 hours. Cardiac Enzymes Recent Labs    12/31/18 2238 01/01/19 0629  TROPONINI 0.04* 0.04*    BNP: BNP (last 3 results) Recent Labs    06/20/18 2249 10/06/18 1309 11/05/18 2224  BNP 160.6* 214.8* 228.9*    ProBNP (last 3 results) No results for input(s): PROBNP in the last  8760 hours.   D-Dimer No results for input(s): DDIMER in the last 72 hours. Hemoglobin A1C No results for input(s): HGBA1C in the last 72 hours. Fasting Lipid Panel No results for input(s): CHOL, HDL, LDLCALC, TRIG, CHOLHDL, LDLDIRECT in the last 72 hours. Thyroid Function Tests No results for input(s): TSH, T4TOTAL, T3FREE, THYROIDAB in the last 72 hours.  Invalid input(s): FREET3  Other results:   Imaging     No results found.   Medications:     Scheduled Medications: . sodium chloride   Intravenous Once  . midodrine  5 mg Oral TID WC  . pantoprazole  40 mg Oral BID  . potassium chloride  40 mEq Oral Once  . sodium chloride flush  3 mL Intravenous Q12H  . sodium chloride flush  3 mL Intravenous Q12H     Infusions: . sodium chloride    . furosemide 120 mg (01/02/19 1008)     PRN Medications:  sodium chloride, fentaNYL (SUBLIMAZE) injection, ondansetron **OR** ondansetron (ZOFRAN) IV, sodium chloride flush   Assessment/Plan   1. Acute on chronic systolic CHF: Suspected nonischemic cardiomyopathy, RV pacing may contribute. Most recent echo in 1/20 with EF 30-35%, mild AS, moderate MR,  moderate TR, moderately dilated RV.  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  Complicated by CKD stage IV.  On exam today, she remains volume overloaded.  She diuresed well yesterday, weight down 4 lbs.  Breathing easier.  More alert. BP better on midodrine, generally SBP 100s.  - Continue Lasix 120 mg IV bid and replace K.  - Continue midodrine 5 mg tid.   2. AKI on CKD stage IV: In setting of both GI bleeding and volume overload.  Followed by Dr. Jimmy Footman as outpatient.  BUN/creatinine up to 161/3.57 at admission, several weeks ago 92/2.61. Upper GI bleeding may be playing a role in elevated BUN as well.  Dialysis would be very difficult for her given significant RV failure and baseline soft BP.  BUN/creatinine trending down, 150/3.13 today with diuresis and transfusion. I was concerned for uremia yesterday with drowsiness/lethargy, much improved today.  - Continue midodrine.   - Continue diuresis, follow BUN/creatinine closely with diuresis.  - Appreciate nephrology assistance.   3. GI bleed: Recurrent upper GI bleeding of uncertain etiology.  She is not on anticoagulation. She was admitted with hgb 5.4, got 2 units PRBCs on 4/8 with hgb 7.7 today. EGD and capsule study unrevealing in 1/20. GI has seen.  - Colonoscopy recommended given recurrent bleeding from uncertain source.  As long as she does not have aggressive bleeding (nothing overt since she arrived), would hold off until she is better diuresed.  Even then, she will be relatively high risk, and I am not sure she would consent to it. - She is iron deficient but apparently had a reaction to iron infusion in the past.  4. Atrial fibrillation: Chronic, s/p AV nodal ablation with [facility] PPM.  She is not anticoagulated due to recurrent GI bleeding.  5. Right PleurX catheter: Qod drainage, due for drainage today.   Length of Stay: 1  Loralie Champagne, MD  01/02/2019, 10:16 AM  Advanced Heart Failure Team Pager [phone] (M-F; 7a  - 4p)  Please contact [facility] for night-coverage after hours (4p -7a ) and weekends on [email or web address]

## 2019-01-02 NOTE — Consult Note (Signed)
Consultation Note Date: 01/02/2019   Patient Name: [name] [name]  DOB: [birth year]  MRN: [id number]  Age / Sex: 73 y.o., female  PCP: [name], MD Referring Physician: Aline August, MD  Reason for Consultation: Establishing goals of care and Psychosocial/spiritual support  HPI/Patient Profile: 73 y.o. female  admitted on 12/31/2018 history of chronic combined CHF, chronic renal disease stage IV, hypertension, recurrent right pleural effusion, iron deficiency anemia, recent admission from 12/05/2018-12/06/2018 for anemia presumed GI bleeding for which she required 4 units packed red cells transfusion and largely negative GI work-up presented on 12/31/2018 with worsening fatigue.  Hemoglobin was found to be 5.3 on admission.  She was transfused 2 units of packed red cells.  GI, nephrology and heart failure teams are consulted.  Patient has had multiple rehospitalizations over the past 6 months for similar episodes and decompensation 2/2 CHF and CKD.Marland Kitchen  She is actively seen outpatient by cardiology, nephrology and GI.  Discussion has been had in the past that she is not a prime candidate for outpatient dialysis.  She continues to be high risk for decompensation 2/2 to her multiple comorbidities  Patient and her family face treatment option decisions, advanced directive decisions and anticipatory care needs.  Clinical Assessment and Goals of Care:  This NP [name] reviewed medical records, received report from team, assessed the patient and then meet with the patient and spoke to daughter by phone  to discuss diagnosis, prognosis, GOC, EOL wishes disposition and options.  Concept of Hospice and Palliative Care were discussed  A detailed discussion was had today regarding advanced directives.  Concepts specific to code status, artifical feeding and hydration, continued IV antibiotics and rehospitalization  was had.  The difference between a aggressive medical intervention path  and a palliative comfort care path for this patient at this time was had.  Values and goals of care important to patient and family were attempted to be elicited.  Although both patient and her daughter verbalized an understanding of the seriousness of the current medical situation there has been very limited conversation between them regarding her advanced care planning or living will wishes.   Questions and concerns addressed.   PMT will continue to support holistically.   NEXT OF KIN/daughter    SUMMARY OF RECOMMENDATIONS    Code Status/Advance Care Planning:  DNR   Patient and family are open to all offered and available medical interventions to prolong life at this time.   Palliative Prophylaxis:   Bowel Regimen, Delirium Protocol, Frequent Pain Assessment and Oral Care  Additional Recommendations (Limitations, Scope, Preferences):  Full Scope Treatment  Psycho-social/Spiritual:   Desire for further Chaplaincy support:yes  Additional Recommendations: Education on Hospice  Prognosis:    Dependant on desire for life prolonging measures  Discharge Planning: To Be Determined      Primary Diagnoses: Present on Admission: . Symptomatic anemia . Upper GI bleeding . Chronic combined systolic and diastolic CHF (congestive heart failure) ([place]) . Essential hypertension . Acute renal failure superimposed on stage 4  chronic kidney disease ([place]) . Hyponatremia   I have reviewed the medical record, interviewed the patient and family, and examined the patient. The following aspects are pertinent.  Past Medical History:  Diagnosis Date  . Anemia   . Atrial fibrillation ([place])   . Cataract   . Cervical cancer ([place]) 1991   s/p hysterectomy  . Chronic cellulitis   . Chronic combined systolic and diastolic congestive heart failure, NYHA class 2 (H[facility])    LVEF 25-30% with restrictive diastolic filling   . Degenerative joint disease   . Diverticulitis   . Essential hypertension   . GERD (gastroesophageal reflux disease)   . Gout   . Kidney stones   . Morbid obesity ([place])   . NICM (nonischemic cardiomyopathy) ([place]) 02/22/2017  . Osteoarthritis   . Permanent atrial fibrillation 05/04/2009   Qualifier: History of  By: Quentin Cornwall CMA, Janett Billow    . 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 Sinoatrial node dysfunction [facility])    Status post PPM - Dr. Lovena Le  . Sleep apnea    CPAP   Social History   Socioeconomic History  . Marital status: Widowed    Spouse name: Not on file  . Number of children: 1  . Years of education: Not on file  . Highest education level: Some college, no degree  Occupational History  . Not on file  Social Needs  . Financial resource strain: Not hard at all  . Food insecurity:    Worry: Never true    Inability: Never true  . Transportation needs:    Medical: No    Non-medical: No  Tobacco Use  . Smoking status: Former Smoker    Packs/day: 1.00    Types: Cigarettes    Last attempt to quit: 09/26/1979    Years since quitting: 39.2  . Smokeless tobacco: Never Used  Substance and Sexual Activity  . Alcohol use: No  . Drug use: No  . Sexual activity: Not Currently  Lifestyle  . Physical activity:    Days per week: Not on file    Minutes per session: Not on file  . Stress: Not on file  Relationships  . Social connections:    Talks on phone: Not on file    Gets together: Not on file    Attends religious service: Not on file    Active member of club or organization: Not on file    Attends meetings of clubs or organizations: Not on file    Relationship status: Not on file  Other Topics Concern  . Not on file  Social History Narrative  . Not on file   Family History  Problem Relation Age of Onset  . Heart attack Father   . Stroke Mother   . Diabetes Sister   . Colon cancer Other         seven family members  . Liver disease Neg Hx   . Other Neg Hx    Scheduled Meds: . sodium chloride   Intravenous Once  . midodrine  5 mg Oral TID WC  . pantoprazole  40 mg Oral BID  . potassium chloride  40 mEq Oral Once  . senna-docusate  1 tablet Oral BID  . sodium chloride flush  3 mL Intravenous Q12H  . sodium chloride flush  3 mL Intravenous Q12H   Continuous Infusions: . sodium chloride    . furosemide 120 mg (01/02/19  1008)   PRN Meds:.sodium chloride, fentaNYL (SUBLIMAZE) injection, ondansetron **OR** ondansetron (ZOFRAN) IV, polyethylene glycol, sodium chloride flush Medications Prior to Admission:  Prior to Admission medications   Medication Sig Start Date End Date Taking? Authorizing Provider  allopurinol (ZYLOPRIM) 300 MG tablet Take 300 mg by mouth every evening.    Yes [provider]  aspirin EC 325 MG tablet Take 325 mg by mouth every other day.    Yes [provider]  calcitRIOL (ROCALTROL) 0.25 MCG capsule Take 0.25 mcg by mouth every other day.    Yes [provider]  COD LIVER OIL PO Take 1 capsule by mouth daily.    Yes [provider]  docusate sodium (COLACE) 100 MG capsule Take 100 mg by mouth daily as needed for mild constipation.    Yes [provider]  ferrous sulfate 325 (65 FE) MG tablet Take 325 mg by mouth at bedtime.    Yes [provider]  HYDROcodone-acetaminophen (NORCO) 10-325 MG tablet Take 1 tablet by mouth See admin instructions. Take 1 tablet by mouth two times a day and an additional 0.5 tablet two times a day as needed for pain 11/15/18  Yes Gherghe, Vella Redhead, MD  isosorbide mononitrate (IMDUR) 30 MG 24 hr tablet Take 0.5 tablets (15 mg total) by mouth daily. Patient taking differently: Take 15 mg by mouth at bedtime.  12/10/17  Yes Fay Records, MD  metolazone (ZAROXOLYN) 2.5 MG tablet Take 2.5 mg by mouth See admin instructions. Take 1 tablet by mouth every Monday, Wednesday, Friday,  Saturday.   Yes [provider]  polyethylene glycol (MIRALAX / GLYCOLAX) packet Take 17 g by mouth at bedtime.   Yes [provider]  potassium chloride SA (K-DUR,KLOR-CON) 20 MEQ tablet Take 1 tablet (20 mEq total) by mouth 3 (three) times a week. Taking 56mq twice daily = 440m on Monday, Wednesday, Friday. Patient taking differently: Take 20-40 mEq by mouth See admin instructions. Take 2 tablets in the morning and 1 tablet by mouth in the evening on Monday, Wednesday, Friday, Saturday 12/11/18  Yes Lama, GaMarge DuncansMD  torsemide (DEMADEX) 20 MG tablet Take 4 tablets (80 mg total) by mouth 2 (two) times daily. May also take 1 tablet (20 mg total) as needed (for weight greater than 246). Patient taking differently: Take 80 mg by mouth two times a day and an additional 20 mg as needed for a weight gain >246 07/15/18  Yes Clegg, Amy D, NP  vitamin B-12 (CYANOCOBALAMIN) 500 MCG tablet Take 500 mcg by mouth daily.   Yes [provider]  feeding supplement (BOOST HIGH PROTEIN) LIQD Take 1 Container by mouth 2 (two) times daily.     [provider]  methocarbamol (ROBAXIN) 500 MG tablet Take 1 tablet (500 mg total) by mouth every 6 (six) hours as needed for muscle spasms. Patient not taking: Reported on 12/31/2018 10/18/18   AdShelly CossMD  Misc. Devices MISC 1 each by Does not apply route at bedtime. C-PAP    [provider]  pantoprazole (PROTONIX) 40 MG tablet Take 1 tablet (40 mg total) by mouth 2 (two) times daily. Patient not taking: Reported on 12/31/2018 10/02/18   LeMahala MenghiniPA-C   Allergies  Allergen Reactions  . Beta Adrenergic Blockers Anaphylaxis and Other (See Comments)    "DEATH" (Bradycardia and her organs shut down)  . Cephalexin Shortness Of Breath  . Codeine Anaphylaxis and Swelling    Throat swelling  .  Contrast Media [Iodinated Diagnostic Agents] Other (See Comments)    Patient states "I have chronic kidney disease, so the doctor  said no dye in my veins."  . Coreg [Carvedilol] Other (See Comments)    Beta Blockers cause her organs to shut down (per patient)  . Peppermint Flavor Shortness Of Breath  . Prednisone Anaphylaxis, Shortness Of Breath and Swelling    Throat swells   . Tape Other (See Comments)    States plastic tape blisters her skin  . Ciprofloxacin Hives    Tolerating levofloxin   . Latex Itching and Rash  . Penicillins Hives    DID THE REACTION INVOLVE: Swelling of the face/tongue/throat, SOB, or low BP? Yes Sudden or severe rash/hives, skin peeling, or the inside of the mouth or nose? Yes Did it require medical treatment? Pt was in hospital at the time of reaction When did it last happen?Was in her mid-40's If all above answers are "NO", may proceed with cephalosporin use.  . Iron Other (See Comments)    Per Patient- Felt like skin was crawling, foaming at mouth, and felt like she was going "crazy"  . Diamox [Acetazolamide] Rash    Rash after 2 doses of diamox    Review of Systems  Constitutional: Positive for fatigue.  Neurological: Positive for weakness.    Physical Exam Constitutional:      Appearance: She is overweight. She is ill-appearing.     Interventions: Nasal cannula in place.  Cardiovascular:     Rate and Rhythm: Tachycardia present.  Skin:    General: Skin is warm and dry.  Neurological:     Mental Status: She is lethargic.     Vital Signs: BP (!) 110/46   Pulse (!) 101   Temp 98.7 F (37.1 C) (Oral)   Resp (!) 24   Ht 5' 6"  (1.676 m)   Wt 110.6 kg   SpO2 98%   BMI 39.35 kg/m  Pain Scale: 0-10   Pain Score: Asleep   SpO2: SpO2: 98 % O2 Device:SpO2: 98 % O2 Flow Rate: .O2 Flow Rate (L/min): 2 L/min  IO: Intake/output summary:   Intake/Output Summary (Last 24 hours) at 01/02/2019 1220 Last data filed at 01/02/2019 0958 Gross per 24 hour  Intake 787.33 ml  Output 1650 ml  Net -862.67 ml    LBM: Last BM Date: (PTA) Baseline Weight: Weight: 112.2 kg Most  recent weight: Weight: 110.6 kg     Palliative Assessment/Data:  30%   Discussed with Dr Starla Link.  I let patient and her family and the attending know this nurse practitioner will not be in the hospital again until Sunday however they can call the team phone with any questions or concerns.  I will follow-up with Ms. Torok on Sunday  Time In: 1445 Time Out: 1600 Time Total: 75 minutes Greater than 50%  of this time was spent counseling and coordinating care related to the above assessment and plan.  Signed by: [name], NP   Please contact Palliative Medicine Team phone at [phone] for questions and concerns.  For individual provider: See Shea Evans

## 2019-01-02 NOTE — Progress Notes (Signed)
Drained PleurX drain of 750 ml. Pt tolerated procedure well. Pt refused to have blue clamp attached. Pt states her daughter does not use it because it hurts her. There was not a clamp when dressing removed. Pt resting with call bell within reach.  Will continue to monitor. Payton Emerald, RN

## 2019-01-02 NOTE — Plan of Care (Signed)
  Problem: Education: Goal: Ability to identify signs and symptoms of gastrointestinal bleeding will improve Outcome: Progressing   Problem: Bowel/Gastric: Goal: Will show no signs and symptoms of gastrointestinal bleeding Outcome: Progressing   Problem: Fluid Volume: Goal: Will show no signs and symptoms of excessive bleeding Outcome: Progressing   Problem: Education: Goal: Knowledge of General Education information will improve Description Including pain rating scale, medication(s)/side effects and non-pharmacologic comfort measures Outcome: Progressing   Problem: Health Behavior/Discharge Planning: Goal: Ability to manage health-related needs will improve Outcome: Progressing   Problem: Clinical Measurements: Goal: Ability to maintain clinical measurements within normal limits will improve Outcome: Progressing Goal: Will remain free from infection Outcome: Progressing Goal: Diagnostic test results will improve Outcome: Progressing Goal: Respiratory complications will improve Outcome: Progressing Goal: Cardiovascular complication will be avoided Outcome: Progressing   Problem: Activity: Goal: Risk for activity intolerance will decrease Outcome: Progressing   Problem: Nutrition: Goal: Adequate nutrition will be maintained Outcome: Progressing   Problem: Coping: Goal: Level of anxiety will decrease Outcome: Progressing   Problem: Elimination: Goal: Will not experience complications related to bowel motility Outcome: Progressing Goal: Will not experience complications related to urinary retention Outcome: Progressing   Problem: Pain Managment: Goal: General experience of comfort will improve Outcome: Progressing   Problem: Safety: Goal: Ability to remain free from injury will improve Outcome: Progressing   Problem: Skin Integrity: Goal: Risk for impaired skin integrity will decrease Outcome: Progressing

## 2019-01-03 LAB — BASIC METABOLIC PANEL
Anion gap: 11 (ref 5–15)
BUN: 135 mg/dL — ABNORMAL HIGH (ref 8–23)
CO2: 31 mmol/L (ref 22–32)
Calcium: 8.3 mg/dL — ABNORMAL LOW (ref 8.9–10.3)
Chloride: 90 mmol/L — ABNORMAL LOW (ref 98–111)
Creatinine, Ser: 2.93 mg/dL — ABNORMAL HIGH (ref 0.44–1.00)
GFR calc Af Amer: 18 mL/min — ABNORMAL LOW (ref >60)
GFR calc non Af Amer: 15 mL/min — ABNORMAL LOW (ref >60)
Glucose, Bld: 101 mg/dL — ABNORMAL HIGH (ref 70–99)
Potassium: 3.1 mmol/L — ABNORMAL LOW (ref 3.5–5.1)
Sodium: 132 mmol/L — ABNORMAL LOW (ref 135–145)

## 2019-01-03 LAB — TYPE AND SCREEN
ABO/RH(D): O POS
Antibody Screen: POSITIVE
Unit division: 0
Unit division: 0
Unit division: 0

## 2019-01-03 LAB — BPAM RBC
Blood Product Expiration Date: 202005022359
Blood Product Expiration Date: 202005052359
Blood Product Expiration Date: 202005092359
ISSUE DATE / TIME: 202004080136
ISSUE DATE / TIME: 202004080847
ISSUE DATE / TIME: 202004082259
Unit Type and Rh: 5100
Unit Type and Rh: 5100
Unit Type and Rh: 5100

## 2019-01-03 LAB — CBC WITH DIFFERENTIAL/PLATELET
Abs Immature Granulocytes: 0.03 10*3/uL (ref 0.00–0.07)
Basophils Absolute: 0 10*3/uL (ref 0.0–0.1)
Basophils Relative: 0 %
Eosinophils Absolute: 0.4 10*3/uL (ref 0.0–0.5)
Eosinophils Relative: 5 %
HCT: 25.1 % — ABNORMAL LOW (ref 36.0–46.0)
Hemoglobin: 7.6 g/dL — ABNORMAL LOW (ref 12.0–15.0)
Immature Granulocytes: 0 %
Lymphocytes Relative: 11 %
Lymphs Abs: 0.8 10*3/uL (ref 0.7–4.0)
MCH: 27.1 pg (ref 26.0–34.0)
MCHC: 30.3 g/dL (ref 30.0–36.0)
MCV: 89.6 fL (ref 80.0–100.0)
Monocytes Absolute: 0.7 10*3/uL (ref 0.1–1.0)
Monocytes Relative: 11 %
Neutro Abs: 4.8 10*3/uL (ref 1.7–7.7)
Neutrophils Relative %: 73 %
Platelets: 166 10*3/uL (ref 150–400)
RBC: 2.8 MIL/uL — ABNORMAL LOW (ref 3.87–5.11)
RDW: 15.8 % — ABNORMAL HIGH (ref 11.5–15.5)
WBC: 6.7 10*3/uL (ref 4.0–10.5)
nRBC: 0 % (ref 0.0–0.2)

## 2019-01-03 LAB — MAGNESIUM: Magnesium: 2.6 mg/dL — ABNORMAL HIGH (ref 1.7–2.4)

## 2019-01-03 MED ORDER — POTASSIUM CHLORIDE CRYS ER 20 MEQ PO TBCR: 40.0000 meq | EXTENDED_RELEASE_TABLET | Freq: Once | ORAL | Status: DC

## 2019-01-03 MED ORDER — METOLAZONE 2.5 MG PO TABS
2.5000 mg | ORAL_TABLET | Freq: Once | ORAL | Status: AC
  Administered 2019-01-03: 2.5 mg via ORAL
  Filled 2019-01-03: qty 1

## 2019-01-03 MED ORDER — POTASSIUM CHLORIDE CRYS ER 20 MEQ PO TBCR
40.0000 meq | EXTENDED_RELEASE_TABLET | Freq: Once | ORAL | Status: AC
  Administered 2019-01-03: 40 meq via ORAL
  Filled 2019-01-03: qty 2

## 2019-01-03 MED ORDER — POTASSIUM CHLORIDE CRYS ER 20 MEQ PO TBCR
40.0000 meq | EXTENDED_RELEASE_TABLET | ORAL | Status: AC
  Administered 2019-01-03 (×2): 40 meq via ORAL
  Filled 2019-01-03 (×2): qty 2

## 2019-01-03 NOTE — Evaluation (Signed)
Physical Therapy Evaluation Patient Details Name: [name] [name] MRN: [id number] DOB: 1945-09-27 Today's Date: 01/03/2019   History of Present Illness  Pt is a 73 y/o female admitted secondary to low hemoglobin likely from GI bleed. Pt awaiting colonoscopy, however, pt also with CHF and awaiting cardiac clearance. PMH includes CHF, CKD, and has R pleurx catheter.   Clinical Impression  Pt admitted secondary to problem above with deficits below. Pt had just transferred from [facility] with RN, so only agreeable to performing lateral scooting and bed mobility. Pt requiring supervision for safety. Pt very adamant about going home at d/c. Feel she would benefit from HHPT. Will continue to follow acutely to maximize functional mobility independence and safety.     Follow Up Recommendations Home health PT;Supervision for mobility/OOB    Equipment Recommendations  None recommended by PT    Recommendations for Other Services       Precautions / Restrictions Precautions Precautions: Fall Restrictions Weight Bearing Restrictions: No      Mobility  Bed Mobility Overal bed mobility: Needs Assistance Bed Mobility: Sit to Supine       Sit to supine: Supervision   General bed mobility comments: Pt sitting at EOB upon entry, as she had just transferred from [facility]. Required supervision for safety to return to supine.   Transfers Overall transfer level: Needs assistance Equipment used: None Transfers: Lateral/Scoot Transfers          Lateral/Scoot Transfers: Supervision General transfer comment: Pt refusing transfers as she had just transferred from [facility]. Was able to perform lateral scooting up towards [facility] for repositioning prior to return to supine.   Ambulation/Gait                Stairs            Wheelchair Mobility    Modified Rankin (Stroke Patients Only)       Balance Overall balance assessment: Needs assistance Sitting-balance support: No upper extremity  supported;Feet supported Sitting balance-Leahy Scale: Good                                       Pertinent Vitals/Pain Pain Assessment: No/denies pain    Home Living Family/patient expects to be discharged to:: Private residence Living Arrangements: Children Available Help at Discharge: Family;Available PRN/intermittently Type of Home: House Home Access: Level entry     Home Layout: Two level;Able to live on main level with bedroom/bathroom Home Equipment: Tub bench;Walker - 2 wheels;Bedside commode;Cane - single point;Electric scooter;Hospital bed;Wheelchair - manual      Prior Function Level of Independence: Needs assistance   Gait / Transfers Assistance Needed: Reports she uses RW to ambulate into the bathroom. Uses WC for all other mobility. Reports she is independent with transfers to [facility].   ADL's / Homemaking Assistance Needed: Reports she sometimes requires assist with ADL tasks.         Hand Dominance   Dominant Hand: Right    Extremity/Trunk Assessment   Upper Extremity Assessment Upper Extremity Assessment: Defer to OT evaluation    Lower Extremity Assessment Lower Extremity Assessment: Generalized weakness    Cervical / Trunk Assessment Cervical / Trunk Assessment: Kyphotic  Communication   Communication: No difficulties  Cognition Arousal/Alertness: Awake/alert Behavior During Therapy: WFL for tasks assessed/performed Overall Cognitive Status: No family/caregiver present to determine baseline cognitive functioning  General Comments      Exercises     Assessment/Plan    PT Assessment Patient needs continued PT services  PT Problem List Decreased strength;Decreased balance;Decreased cognition;Decreased knowledge of precautions;Decreased mobility;Decreased knowledge of use of DME;Cardiopulmonary status limiting activity;Decreased activity tolerance;Decreased  coordination;Decreased safety awareness       PT Treatment Interventions DME instruction;Gait training;Functional mobility training;Therapeutic activities;Therapeutic exercise;Balance training;Patient/family education;Wheelchair mobility training    PT Goals (Current goals can be found in the Care Plan section)  Acute Rehab PT Goals Patient Stated Goal: to go home  PT Goal Formulation: With patient Time For Goal Achievement: 01/17/19 Potential to Achieve Goals: Fair    Frequency Min 3X/week   Barriers to discharge        Co-evaluation               AM-PAC PT "6 Clicks" Mobility  Outcome Measure Help needed turning from your back to your side while in a flat bed without using bedrails?: None Help needed moving from lying on your back to sitting on the side of a flat bed without using bedrails?: None Help needed moving to and from a bed to a chair (including a wheelchair)?: A Little Help needed standing up from a chair using your arms (e.g., wheelchair or bedside chair)?: A Little Help needed to walk in hospital room?: A Little Help needed climbing 3-5 steps with a railing? : Total 6 Click Score: 18    End of Session   Activity Tolerance: Patient tolerated treatment well Patient left: in bed;with call bell/phone within reach;with bed alarm set Nurse Communication: Mobility status PT Visit Diagnosis: Muscle weakness (generalized) (M62.81);Difficulty in walking, not elsewhere classified (R26.2)    Time: 7471-8550 PT Time Calculation (min) (ACUTE ONLY): 10 min   Charges:   PT Evaluation $PT Eval Moderate Complexity: [place], PT, DPT  Acute Rehabilitation Services  Pager: [phone] Office: [phone]   Rudean Hitt 01/03/2019, 4:00 PM

## 2019-01-03 NOTE — Progress Notes (Signed)
[facility] Progress Note    Assessment/ Plan:   1.  AKI on CKD IV: Followed by Dr. Jimmy Footman, last seen 4/7 as OP where she was found to be vol overloaded.  AKI likely due to anemia and hypotension; CKD IV d/t cardiorenal syndrome.  I think she has some slight uremic symptoms although BUN is not as high as it's been in the past- 188-190 is the highest it's been.  BUN elevated this time likely d/t GIB; it can also be quite high in individuals with decompensated CHF.  pRBCs and augmenting BP along with Lasix has started to improve her Cr/ BUN and hemodynamics.  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  I agree with the addition of midodrine to augment BP and high-dose Lasix as ordered by AHF.  Appreciate palliative care c/s --> pt expressed the desire to "stay home more" today so I think further conversations can center around that.  Pt specifically said "it would be good if I could get blood when I need it instead of so low I'm about to pass out."    2.  Hypotension: midodrine as above  3.  GIB: + melanotic stools, GI consulted, previous EGD capsule without source of bleeding; c-scope on hold for now.     4.  Acute on chronic systolic CHF: high-dose Lasix 120 mg BID per AHF.  5.  Recurrent R pleural effusion: Pleurx drainage QOD -- last drained 4/9 and would probably not do more than 750-1L unless effusion really increasing-- this will cause fluid shifts as well and we could see hypotension and second insult of AKI.    6.  Hypokalemia: repleting  7.  Dispo: pending improvement of Hgb, hemodynamics, and AKI.  Subjective:    Cr continues to improve.  Pt reports that she wants to "stay home more" and not have to come to the hospital as much   Objective:   BP (!) 104/56 (BP Location: Left Arm)   Pulse (!) 109   Temp 98.3 F (36.8 C) (Oral)   Resp 19   Ht 5' 6"  (1.676 m)   Wt 110.6 kg   SpO2 98%   BMI  39.35 kg/m   Intake/Output Summary (Last 24 hours) at 01/03/2019 1044 Last data filed at 01/03/2019 0033 Gross per 24 hour  Intake 240 ml  Output 1600 ml  Net -1360 ml   Weight change:   Physical Exam: GEN older woman, NAD HEENT EOMI PERRL NECK + JVD to mandible, unchanged. PULM R Pleurx tube in place, mildly increased WOB CV RRR, + RV heave, no longer with as pronounced S3 ABD distended, improving EXT 2+ LE edema, unchanged NEURO AAO x 3, no asterixis today SKIN burnished over LEs, healing blisters with eschar LLE  Imaging: No results found.  Labs: BMET Recent Labs  Lab 12/31/18 1732 01/01/19 0629 01/01/19 1359 01/02/19 0248 01/02/19 1350 01/03/19 0219  NA 127* 130* 130* 132*  --  132*  K 3.4* 2.7* 2.9* 2.7* 2.9* 3.1*  CL 86* 89* 88* 90*  --  90*  CO2 27 27 26 27   --  31  GLUCOSE 162* 134* 176* 106*  --  101*  BUN 161* 160* 158* 150*  --  135*  CREATININE 3.57* 3.39* 3.34* 3.13*  --  2.93*  CALCIUM 8.6* 8.5* 8.5* 8.4*  --  8.3*   CBC Recent Labs  Lab 12/31/18 1732 12/31/18 1828 01/01/19 0735 01/01/19 1359 01/02/19 0248  01/03/19 0219  WBC 6.0  --  4.7  --  6.8 6.7  NEUTROABS  --  6.1  --   --   --  4.8  HGB 5.3*  --  5.8* 6.8* 7.7* 7.6*  HCT 18.0*  --  19.3* 22.3* 25.2* 25.1*  MCV 93.3  --  91.0  --  88.1 89.6  PLT 173  --  158  --  172 166    Medications:    . sodium chloride   Intravenous Once  . HYDROcodone-acetaminophen  1 tablet Oral BID  . midodrine  5 mg Oral TID WC  . pantoprazole  40 mg Oral BID  . potassium chloride  40 mEq Oral Q4H  . senna-docusate  1 tablet Oral BID  . sodium chloride flush  3 mL Intravenous Q12H  . sodium chloride flush  3 mL Intravenous Q12H      Madelon Lips, MD [facility] pgr [phone] 01/03/2019, 10:44 AM

## 2019-01-03 NOTE — Progress Notes (Signed)
Patient ID: [name] [name], female   DOB: 07-02-1946, 73 y.o.   MRN: [id number]  PROGRESS NOTE    [name] [name]  WUJ:[id number] DOB: 08-30-1946 DOA: 12/31/2018 PCP: [name], MD   Brief Narrative:  73 year old female with history of chronic combined CHF, chronic renal disease stage IV, hypertension, recurrent right pleural effusion, iron deficiency anemia, recent admission from 12/05/2018-12/06/2018 for anemia presumed GI bleeding for which she required 4 units packed red cells transfusion and largely negative GI work-up presented on 12/31/2018 with worsening fatigue.  Hemoglobin was found to be 5.3 on admission.  She was transfused 2 units of packed red cells.  GI, nephrology and heart failure teams were consulted.  Assessment & Plan:   Principal Problem:   Symptomatic anemia Active Problems:   Essential hypertension   Chronic combined systolic and diastolic CHF (congestive heart failure) (HCC)   Acute renal failure superimposed on stage 4 chronic kidney disease (HCC)   Hyponatremia   Upper GI bleeding   DNR (do not resuscitate)   Weakness generalized  Recurrent symptomatic anemia/iron deficiency anemia most likely from GI bleeding in a patient with anemia of chronic disease -Status post 2 unit packed red cells transfusion during the hospitalization.  Hemoglobin 7.6 today.  Transfuse if hemoglobin is less than 7 -Apparently patient had some type of reaction to IV iron in the past as per the daughter; will hold off for now.  GI bleeding - Continue oral Protonix.  Patient is not stable for colonoscopy as per advanced heart failure team.  GI has signed off.  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  Currently on intravenous Lasix.  Also on oral midodrine for soft blood pressures.  Imdur on hold.  Strict input and output.  Daily weights.  Negative balance of 3571 .9 cc since admission.  Acute kidney injury on chronic  kidney disease stage IV -Nephrology following.  Creatinine 2.93 today.  Patient is not a good candidate for dialysis.  Generalized deconditioning and overall poor prognosis -Overall condition is very poor.  Palliative care evaluation appreciated.  Patient is a DNR -I think patient probably would be a good candidate for hospice.  I have told the same to the patient and have encouraged her to talk to her family.  Recurrent right-sided pleural effusion status post right Pleurx catheter placement on 11/11/2018 -Drain Pleurx catheter every other day  Chronic atrial fibrillation status post AV node ablation with [facility] Jude PPM -Not on anticoagulation due to recurrent GI bleeding.    DVT prophylaxis: SCDs Code Status: DNR Family Communication: None at bedside.  Tried to call Odis Wickey on phone on 01/02/2019 but she cannot recall. Disposition Plan: Uncertain  Consultants:  Cardiology/GI/nephrology  Procedures: None Antimicrobials: None   Subjective: Patient seen and examined at bedside.  She is awake but a poor historian.  She denies any worsening shortness of breath, chest pain or vomiting.  She is wondering when she can go home. Objective: Vitals:   01/02/19 2109 01/03/19 0034 01/03/19 0839 01/03/19 0843  BP: (!) 95/49 120/68  (!) 104/56  Pulse:    (!) 109  Resp:    19  Temp:   98.3 F (36.8 C)   TempSrc:   Oral   SpO2:    98%  Weight:      Height:        Intake/Output Summary (Last 24 hours) at 01/03/2019 0932 Last data filed at 01/03/2019 0033 Gross per 24 hour  Intake 462 ml  Output 1600 ml  Net -1138 ml   Filed Weights   12/31/18 2144 01/02/19 0654  Weight: 112.2 kg 110.6 kg    Examination:  General exam: No acute distress; looks chronically ill. Respiratory system: Bilateral decreased breath sounds at bases with scattered crackles mostly in the bases.  No wheezing Cardiovascular system: S1 & S2 heard, intermittently tachycardic Gastrointestinal system:  Abdomen is nondistended, soft and nontender. Normal bowel sounds heard. Extremities: No cyanosis; lower extremity edema present    Data Reviewed: I have personally reviewed following labs and imaging studies  CBC: Recent Labs  Lab 12/31/18 1732 12/31/18 1828 01/01/19 0735 01/01/19 1359 01/02/19 0248 01/03/19 0219  WBC 6.0  --  4.7  --  6.8 6.7  NEUTROABS  --  6.1  --   --   --  4.8  HGB 5.3*  --  5.8* 6.8* 7.7* 7.6*  HCT 18.0*  --  19.3* 22.3* 25.2* 25.1*  MCV 93.3  --  91.0  --  88.1 89.6  PLT 173  --  158  --  172 761   Basic Metabolic Panel: Recent Labs  Lab 12/31/18 1732 12/31/18 1828 01/01/19 0629 01/01/19 1359 01/02/19 0248 01/02/19 1350 01/03/19 0219  NA 127*  --  130* 130* 132*  --  132*  K 3.4*  --  2.7* 2.9* 2.7* 2.9* 3.1*  CL 86*  --  89* 88* 90*  --  90*  CO2 27  --  27 26 27   --  31  GLUCOSE 162*  --  134* 176* 106*  --  101*  BUN 161*  --  160* 158* 150*  --  135*  CREATININE 3.57*  --  3.39* 3.34* 3.13*  --  2.93*  CALCIUM 8.6*  --  8.5* 8.5* 8.4*  --  8.3*  MG  --  3.2*  --   --   --   --  2.6*   GFR: Estimated Creatinine Clearance: 21.5 mL/min (A) (by C-G formula based on SCr of 2.93 mg/dL (H)). Liver Function Tests: Recent Labs  Lab 12/31/18 1732  AST 21  ALT 14  ALKPHOS 59  BILITOT 0.6  PROT 5.5*  ALBUMIN 2.8*   No results for input(s): LIPASE, AMYLASE in the last 168 hours. No results for input(s): AMMONIA in the last 168 hours. Coagulation Profile: Recent Labs  Lab 12/31/18 2238  INR 1.1   Cardiac Enzymes: Recent Labs  Lab 12/31/18 2238 01/01/19 0629  TROPONINI 0.04* 0.04*   BNP (last 3 results) No results for input(s): PROBNP in the last 8760 hours. HbA1C: No results for input(s): HGBA1C in the last 72 hours. CBG: No results for input(s): GLUCAP in the last 168 hours. Lipid Profile: No results for input(s): CHOL, HDL, LDLCALC, TRIG, CHOLHDL, LDLDIRECT in the last 72 hours. Thyroid Function Tests: No results for  input(s): TSH, T4TOTAL, FREET4, T3FREE, THYROIDAB in the last 72 hours. Anemia Panel: Recent Labs    12/31/18 1828  VITAMINB12 3,640*  FOLATE 38.1  FERRITIN 40  TIBC 521*  IRON 13*  RETICCTPCT 6.3*   Sepsis Labs: No results for input(s): PROCALCITON, LATICACIDVEN in the last 168 hours.  Recent Results (from the past 240 hour(s))  MRSA PCR Screening     Status: None   Collection Time: 12/31/18  9:58 PM  Result Value Ref Range Status   MRSA by PCR NEGATIVE NEGATIVE Final    Comment:        The GeneXpert MRSA Assay (  FDA approved for NASAL specimens only), is one component of a comprehensive MRSA colonization surveillance program. It is not intended to diagnose MRSA infection nor to guide or monitor treatment for MRSA infections. Performed at [facility], [place] [address] [place], [place] [address]          Radiology Studies: No results found.      Scheduled Meds: . sodium chloride   Intravenous Once  . HYDROcodone-acetaminophen  1 tablet Oral BID  . midodrine  5 mg Oral TID WC  . pantoprazole  40 mg Oral BID  . potassium chloride  40 mEq Oral Q4H  . senna-docusate  1 tablet Oral BID  . sodium chloride flush  3 mL Intravenous Q12H  . sodium chloride flush  3 mL Intravenous Q12H   Continuous Infusions: . sodium chloride    . furosemide 120 mg (01/03/19 0850)     LOS: 2 days        Aline August, MD [facility] 01/03/2019, 9:32 AM

## 2019-01-03 NOTE — Care Management Important Message (Signed)
Important Message  Patient Details  Name: [name] [name] MRN: [id number] Date of Birth: [birth year]   Medicare Important Message Given:  Yes    Orbie Pyo 01/03/2019, 2:59 PM

## 2019-01-03 NOTE — Progress Notes (Signed)
Patient ID: [name] [name], female   DOB: 1946-03-29, 73 y.o.   MRN: [id number]     Advanced Heart Failure Rounding Note  PCP-Cardiologist: Dorris Carnes, MD   Subjective:    She had 2 units PRBCs on 4/8, hgb stable today at 7.6. She is more alert today.  BUN/creatinine trending downwards but still very high.  Not much UOP recorded yesterday and weight not done today. BP better on midodrine.   PleurX catheter drained yesterday.    Objective:   Weight Range: 110.6 kg Body mass index is 39.35 kg/m.   Vital Signs:   Temp:  [97.5 F (36.4 C)-98.3 F (36.8 C)] 98.3 F (36.8 C) (04/10 0839) Pulse Rate:  [109] 109 (04/10 0843) Resp:  [19] 19 (04/10 0843) BP: (89-120)/(46-68) 104/56 (04/10 0843) SpO2:  [98 %] 98 % (04/10 0843) Last BM Date: 01/02/19  Weight change: Filed Weights   12/31/18 2144 01/02/19 0654  Weight: 112.2 kg 110.6 kg    Intake/Output:   Intake/Output Summary (Last 24 hours) at 01/03/2019 1238 Last data filed at 01/03/2019 0033 Gross per 24 hour  Intake -  Output 1100 ml  Net -1100 ml      Physical Exam    General: NAD Neck: JVP 14-16 cm, no thyromegaly or thyroid nodule.  Lungs: Decreased BS at bases.  CV: Nondisplaced PMI.  Heart regular S1/S2, no S3/S4, 2/6 HSM LLSB/apex.  1+ ankle edema.   Abdomen: Soft, nontender, no hepatosplenomegaly, no distention.  Skin: Intact without lesions or rashes.  Neurologic: Alert and oriented x 3.  Psych: Normal affect. Extremities: No clubbing or cyanosis.  HEENT: Normal.    Telemetry   Atrial fibrillation with v-pacing in 90s. Personally reviewed.   Labs    CBC Recent Labs    12/31/18 1828  01/02/19 0248 01/03/19 0219  WBC  --    < > 6.8 6.7  NEUTROABS 6.1  --   --  4.8  HGB  --    < > 7.7* 7.6*  HCT  --    < > 25.2* 25.1*  MCV  --    < > 88.1 89.6  PLT  --    < > 172 166   < > = values in this interval not displayed.   Basic Metabolic Panel Recent Labs    12/31/18 1828  01/02/19 0248  01/02/19 1350 01/03/19 0219  NA  --    < > 132*  --  132*  K  --    < > 2.7* 2.9* 3.1*  CL  --    < > 90*  --  90*  CO2  --    < > 27  --  31  GLUCOSE  --    < > 106*  --  101*  BUN  --    < > 150*  --  135*  CREATININE  --    < > 3.13*  --  2.93*  CALCIUM  --    < > 8.4*  --  8.3*  MG 3.2*  --   --   --  2.6*   < > = values in this interval not displayed.   Liver Function Tests Recent Labs    12/31/18 1732  AST 21  ALT 14  ALKPHOS 59  BILITOT 0.6  PROT 5.5*  ALBUMIN 2.8*   No results for input(s): LIPASE, AMYLASE in the last 72 hours. Cardiac Enzymes Recent Labs    12/31/18 2238 01/01/19 0629  TROPONINI 0.04*  0.04*    BNP: BNP (last 3 results) Recent Labs    06/20/18 2249 10/06/18 1309 11/05/18 2224  BNP 160.6* 214.8* 228.9*    ProBNP (last 3 results) No results for input(s): PROBNP in the last 8760 hours.   D-Dimer No results for input(s): DDIMER in the last 72 hours. Hemoglobin A1C No results for input(s): HGBA1C in the last 72 hours. Fasting Lipid Panel No results for input(s): CHOL, HDL, LDLCALC, TRIG, CHOLHDL, LDLDIRECT in the last 72 hours. Thyroid Function Tests No results for input(s): TSH, T4TOTAL, T3FREE, THYROIDAB in the last 72 hours.  Invalid input(s): FREET3  Other results:   Imaging    No results found.   Medications:     Scheduled Medications: . sodium chloride   Intravenous Once  . HYDROcodone-acetaminophen  1 tablet Oral BID  . metolazone  2.5 mg Oral Once  . midodrine  5 mg Oral TID WC  . pantoprazole  40 mg Oral BID  . potassium chloride  40 mEq Oral Once  . potassium chloride  40 mEq Oral Once  . senna-docusate  1 tablet Oral BID  . sodium chloride flush  3 mL Intravenous Q12H  . sodium chloride flush  3 mL Intravenous Q12H    Infusions: . sodium chloride    . furosemide 120 mg (01/03/19 0850)    PRN Medications: sodium chloride, fentaNYL (SUBLIMAZE) injection, HYDROcodone-acetaminophen, ondansetron **OR**  ondansetron (ZOFRAN) IV, polyethylene glycol, sodium chloride flush   Assessment/Plan   1. Acute on chronic systolic CHF: Suspected nonischemic cardiomyopathy, RV pacing may contribute. Most recent echo in 1/20 with EF 30-35%, mild AS, moderate MR, moderate TR, moderately dilated RV. 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  Complicated by CKD stage IV.  On exam today, she remains volume overloaded.  BP better on midodrine, generally SBP 100s. Hard to tell how well she diuresed yesterday, no weight and think I/Os incomplete but BUN/creatinine coming down.  - Need to get weights and I/Os.  - Continue Lasix 120 mg IV bid and replace K. Will give a dose of metolazone 2.5 x 1.  - Continue midodrine 5 mg tid.   2. AKI on CKD stage IV: In setting of both GI bleeding and volume overload.  Followed by Dr. Jimmy Footman as outpatient.  BUN/creatinine up to 161/3.57 at admission, several weeks ago 92/2.61. Upper GI bleeding may be playing a role in elevated BUN as well.  Dialysis would be very difficult for her given significant RV failure and baseline soft BP.  BUN/creatinine trending down, 132/2.93 today. I was concerned for uremia initially with drowsiness/lethargy, much improved today.  - Continue midodrine.   - Continue diuresis, follow BUN/creatinine closely with diuresis.  - Appreciate nephrology assistance.   3. GI bleed: Recurrent upper GI bleeding of uncertain etiology.  She is not on anticoagulation. She was admitted with hgb 5.4, got 2 units PRBCs on 4/8 with hgb 7.6 today. EGD and capsule study unrevealing in 1/20. GI has seen.  - Colonoscopy recommended given recurrent bleeding from uncertain source.  As long as she does not have aggressive bleeding (nothing overt since she arrived), would hold off until she is better diuresed.  Even then, she will be relatively high risk, and I am not sure she would consent to it. Now that she is thinking about home hospice, would hold off.  - She is  iron deficient but apparently had a reaction to iron infusion in the past.  4. Atrial fibrillation: Chronic, s/p AV nodal ablation  with [facility] Jude PPM.  She is not anticoagulated due to recurrent GI bleeding.  5. Right PleurX catheter: Qod drainage, due for drainage tomorrow. 6. Palliative care following. Patient thinking about home hospice and wants to talk with them again. I think that hospice care would be appropriate.  She is interested in staying at home and not coming back to the hospital.    Length of Stay: 2  Loralie Champagne, MD  01/03/2019, 12:38 PM  Advanced Heart Failure Team Pager [phone] (M-F; [place])  Please contact [facility] for night-coverage after hours (4p -7a ) and weekends on [email or web address]

## 2019-01-04 LAB — BASIC METABOLIC PANEL
Anion gap: 13 (ref 5–15)
BUN: 119 mg/dL — ABNORMAL HIGH (ref 8–23)
CO2: 28 mmol/L (ref 22–32)
Calcium: 8.4 mg/dL — ABNORMAL LOW (ref 8.9–10.3)
Chloride: 93 mmol/L — ABNORMAL LOW (ref 98–111)
Creatinine, Ser: 2.87 mg/dL — ABNORMAL HIGH (ref 0.44–1.00)
GFR calc Af Amer: 18 mL/min — ABNORMAL LOW (ref >60)
GFR calc non Af Amer: 16 mL/min — ABNORMAL LOW (ref >60)
Glucose, Bld: 118 mg/dL — ABNORMAL HIGH (ref 70–99)
Potassium: 4.2 mmol/L (ref 3.5–5.1)
Sodium: 134 mmol/L — ABNORMAL LOW (ref 135–145)

## 2019-01-04 LAB — CBC WITH DIFFERENTIAL/PLATELET
Abs Immature Granulocytes: 0.04 10*3/uL (ref 0.00–0.07)
Basophils Absolute: 0 10*3/uL (ref 0.0–0.1)
Basophils Relative: 0 %
Eosinophils Absolute: 0.2 10*3/uL (ref 0.0–0.5)
Eosinophils Relative: 2 %
HCT: 25.3 % — ABNORMAL LOW (ref 36.0–46.0)
Hemoglobin: 7.7 g/dL — ABNORMAL LOW (ref 12.0–15.0)
Immature Granulocytes: 0 %
Lymphocytes Relative: 7 %
Lymphs Abs: 0.6 10*3/uL — ABNORMAL LOW (ref 0.7–4.0)
MCH: 27.7 pg (ref 26.0–34.0)
MCHC: 30.4 g/dL (ref 30.0–36.0)
MCV: 91 fL (ref 80.0–100.0)
Monocytes Absolute: 0.7 10*3/uL (ref 0.1–1.0)
Monocytes Relative: 8 %
Neutro Abs: 7.5 10*3/uL (ref 1.7–7.7)
Neutrophils Relative %: 83 %
Platelets: 156 10*3/uL (ref 150–400)
RBC: 2.78 MIL/uL — ABNORMAL LOW (ref 3.87–5.11)
RDW: 15.8 % — ABNORMAL HIGH (ref 11.5–15.5)
WBC: 9 10*3/uL (ref 4.0–10.5)
nRBC: 0 % (ref 0.0–0.2)

## 2019-01-04 LAB — MAGNESIUM: Magnesium: 2.4 mg/dL (ref 1.7–2.4)

## 2019-01-04 MED ORDER — POTASSIUM CHLORIDE CRYS ER 20 MEQ PO TBCR
40.0000 meq | EXTENDED_RELEASE_TABLET | Freq: Once | ORAL | Status: AC
  Administered 2019-01-04: 40 meq via ORAL
  Filled 2019-01-04: qty 2

## 2019-01-04 MED ORDER — METOLAZONE 2.5 MG PO TABS
2.5000 mg | ORAL_TABLET | Freq: Once | ORAL | Status: AC
  Administered 2019-01-04: 2.5 mg via ORAL
  Filled 2019-01-04: qty 1

## 2019-01-04 MED ORDER — DARBEPOETIN ALFA 200 MCG/0.4ML IJ SOSY
200.0000 ug | PREFILLED_SYRINGE | INTRAMUSCULAR | Status: DC
  Administered 2019-01-04: 200 ug via SUBCUTANEOUS
  Filled 2019-01-04 (×2): qty 0.4

## 2019-01-04 NOTE — Progress Notes (Signed)
Patient ID: [name] [name], female   DOB: 1945-10-16, 73 y.o.   MRN: [id number]     Advanced Heart Failure Rounding Note  PCP-Cardiologist: Dorris Carnes, MD   Subjective:    She had 2 units PRBCs on 4/8, hgb stable today at 7.7. She is alert/oriented today.  BUN/creatinine trending downwards but still very high. BP better on midodrine.  I was able to get a weight today, she is down 5 lbs compared to 4/9.  PleurX catheter due for drainage today.    Objective:   Weight Range: 108 kg Body mass index is 38.45 kg/m.   Vital Signs:   Temp:  [98.1 F (36.7 C)-99 F (37.2 C)] 99 F (37.2 C) (04/11 0300) Pulse Rate:  [89-106] 98 (04/11 0600) Resp:  [14-28] 20 (04/11 0600) BP: (105-114)/(53-60) 105/53 (04/10 2340) SpO2:  [90 %-100 %] 94 % (04/11 0600) Weight:  [108 kg] 108 kg (04/11 1033) Last BM Date: 01/02/19  Weight change: Filed Weights   12/31/18 2144 01/02/19 0654 01/04/19 1033  Weight: 112.2 kg 110.6 kg 108 kg    Intake/Output:   Intake/Output Summary (Last 24 hours) at 01/04/2019 1035 Last data filed at 01/04/2019 1033 Gross per 24 hour  Intake -  Output 970 ml  Net -970 ml      Physical Exam    General: NAD Neck: JVP 14 cm, no thyromegaly or thyroid nodule.  Lungs: Clear to auscultation bilaterally with normal respiratory effort. CV: Nondisplaced PMI.  Heart regular S1/S2, no S3/S4, 2/6 HSM LLSB/apex.  1+ ankle edema.   Abdomen: Soft, nontender, no hepatosplenomegaly, no distention.  Skin: Intact without lesions or rashes.  Neurologic: Alert and oriented x 3.  Psych: Normal affect. Extremities: No clubbing or cyanosis.  HEENT: Normal.    Telemetry   Atrial fibrillation with v-pacing in 90s-100s. Personally reviewed.   Labs    CBC Recent Labs    01/03/19 0219 01/04/19 0351  WBC 6.7 9.0  NEUTROABS 4.8 7.5  HGB 7.6* 7.7*  HCT 25.1* 25.3*  MCV 89.6 91.0  PLT 166 867   Basic Metabolic Panel Recent Labs    01/03/19 0219 01/04/19 0351  NA 132*  134*  K 3.1* 4.2  CL 90* 93*  CO2 31 28  GLUCOSE 101* 118*  BUN 135* 119*  CREATININE 2.93* 2.87*  CALCIUM 8.3* 8.4*  MG 2.6* 2.4   Liver Function Tests No results for input(s): AST, ALT, ALKPHOS, BILITOT, PROT, ALBUMIN in the last 72 hours. No results for input(s): LIPASE, AMYLASE in the last 72 hours. Cardiac Enzymes No results for input(s): CKTOTAL, CKMB, CKMBINDEX, TROPONINI in the last 72 hours.  BNP: BNP (last 3 results) Recent Labs    06/20/18 2249 10/06/18 1309 11/05/18 2224  BNP 160.6* 214.8* 228.9*    ProBNP (last 3 results) No results for input(s): PROBNP in the last 8760 hours.   D-Dimer No results for input(s): DDIMER in the last 72 hours. Hemoglobin A1C No results for input(s): HGBA1C in the last 72 hours. Fasting Lipid Panel No results for input(s): CHOL, HDL, LDLCALC, TRIG, CHOLHDL, LDLDIRECT in the last 72 hours. Thyroid Function Tests No results for input(s): TSH, T4TOTAL, T3FREE, THYROIDAB in the last 72 hours.  Invalid input(s): FREET3  Other results:   Imaging    No results found.   Medications:     Scheduled Medications: . sodium chloride   Intravenous Once  . HYDROcodone-acetaminophen  1 tablet Oral BID  . metolazone  2.5 mg Oral Once  .  midodrine  5 mg Oral TID WC  . pantoprazole  40 mg Oral BID  . potassium chloride  40 mEq Oral Once  . senna-docusate  1 tablet Oral BID  . sodium chloride flush  3 mL Intravenous Q12H  . sodium chloride flush  3 mL Intravenous Q12H    Infusions: . sodium chloride    . furosemide 120 mg (01/04/19 0856)    PRN Medications: sodium chloride, fentaNYL (SUBLIMAZE) injection, HYDROcodone-acetaminophen, ondansetron **OR** ondansetron (ZOFRAN) IV, polyethylene glycol, sodium chloride flush   Assessment/Plan   1. Acute on chronic systolic CHF: Suspected nonischemic cardiomyopathy, RV pacing may contribute. Most recent echo in 1/20 with EF 30-35%, mild AS, moderate MR, moderate TR, moderately  dilated RV. 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  Complicated by CKD stage IV.  On exam today, she remains volume overloaded.  BP better on midodrine, generally SBP 100s. She is diuresing reasonably well, weight coming down.  BUN/creatinine coming down as well.  - Continue Lasix 120 mg IV bid and replace K. Will give a dose of metolazone 2.5 x 1 again today.  - Continue midodrine 5 mg tid.   2. AKI on CKD stage IV: In setting of both GI bleeding and volume overload.  Followed by Dr. Jimmy Footman as outpatient.  BUN/creatinine up to 161/3.57 at admission, several weeks ago 92/2.61. Upper GI bleeding may be playing a role in elevated BUN as well.  Dialysis would be very difficult for her given significant RV failure and baseline soft BP.  BUN/creatinine trending down, 119/2.87 today. I was concerned for uremia initially with drowsiness/lethargy, much improved today.  - Continue midodrine.   - Continue diuresis, follow BUN/creatinine closely with diuresis.  - Appreciate nephrology assistance.   3. GI bleed: Recurrent upper GI bleeding of uncertain etiology.  She is not on anticoagulation. She was admitted with hgb 5.4, got 2 units PRBCs on 4/8 with hgb 7.7 today. EGD and capsule study unrevealing in 1/20. GI has seen.  - Colonoscopy recommended given recurrent bleeding from uncertain source.  As long as she does not have aggressive bleeding (nothing overt since she arrived), would hold off until she is better diuresed.  Even then, she will be relatively high risk, and I am not sure she would consent to it. Now that she is thinking about home hospice, would hold off.  - She is iron deficient but apparently had a reaction to iron infusion in the past.  4. Atrial fibrillation: Chronic, s/p AV nodal ablation with [facility] Jude PPM.  She is not anticoagulated due to recurrent GI bleeding.  5. Right PleurX catheter: Qod drainage, due for drainage today. 6. Palliative care following. Patient thinking  about home hospice and wants to talk with them again. I think that hospice care would be appropriate.  She is interested in staying at home and not coming back to the hospital.    Length of Stay: 3  Loralie Champagne, MD  01/04/2019, 10:35 AM  Advanced Heart Failure Team Pager [phone] (M-F; [place])  Please contact [facility] for night-coverage after hours (4p -7a ) and weekends on [email or web address]

## 2019-01-04 NOTE — Progress Notes (Signed)
[name] [name] Progress Note    Assessment/ Plan:   1.  AKI on CKD IV: Followed by Dr. Jimmy Footman, last seen 4/7 as OP where she was found to be vol overloaded.  AKI likely due to anemia and hypotension; CKD IV d/t cardiorenal syndrome.  I think she has some slight uremic symptoms although BUN is not as high as it's been in the past- 188-190 is the highest it's been.  BUN elevated this time likely d/t GIB; it can also be quite high in individuals with decompensated CHF.  pRBCs and augmenting BP along with Lasix has started to improve her Cr/ BUN and hemodynamics.  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  I agree with the addition of midodrine to augment BP and high-dose Lasix/ metolazone as ordered by [facility].  Appreciate palliative care c/s --> pt expressed the desire to "stay home more" today so I think further conversations can center around that.  Pt specifically said "it would be good if I could get blood when I need it instead of so low I'm about to pass out."   2.  Hypotension: midodrine as above  3.  GIB/ Anemia: + melanotic stools, GI consulted, previous EGD capsule without source of bleeding; c-scope on hold for now.  Hgb stable at 7.7, will give Aranesp today.       4.  Acute on chronic systolic CHF: high-dose Lasix 120 mg BID per AHF.  5.  Recurrent R pleural effusion: Pleurx drainage QOD -- last drained 4/9 and would probably not do more than 750-1L unless effusion really increasing-- this will cause fluid shifts as well and we could see hypotension and second insult of AKI.    6.  Hypokalemia: repleting  7.  Dispo: possibly hospice?  Subjective:    Appears more awake and alert today.  BUN and Cr trending down.  Hgb stable at 7.7.  Inquiring still about hospice.     Objective:   BP (!) 105/53 (BP Location: Right Wrist)   Pulse 98   Temp 99 F (37.2 C) (Axillary)   Resp 20   Ht 5' 6"  (1.676 m)    Wt 108 kg   SpO2 94%   BMI 38.45 kg/m   Intake/Output Summary (Last 24 hours) at 01/04/2019 1126 Last data filed at 01/04/2019 1107 Gross per 24 hour  Intake 240 ml  Output 1120 ml  Net -880 ml   Weight change:   Physical Exam: GEN older woman, NAD HEENT EOMI PERRL NECK JVD improving but still present PULM R Pleurx tube in place, mildly increased WOB CV RRR, + RV heave, no longer with as pronounced S3 ABD distended, improving EXT 2+ LE edema, sl better NEURO AAO x 3, no asterixis today SKIN burnished over LEs, healing blisters with eschar LLE  Imaging: No results found.  Labs: BMET Recent Labs  Lab 12/31/18 1732 01/01/19 0629 01/01/19 1359 01/02/19 0248 01/02/19 1350 01/03/19 0219 01/04/19 0351  NA 127* 130* 130* 132*  --  132* 134*  K 3.4* 2.7* 2.9* 2.7* 2.9* 3.1* 4.2  CL 86* 89* 88* 90*  --  90* 93*  CO2 27 27 26 27   --  31 28  GLUCOSE 162* 134* 176* 106*  --  101* 118*  BUN 161* 160* 158* 150*  --  135* 119*  CREATININE 3.57* 3.39* 3.34* 3.13*  --  2.93* 2.87*  CALCIUM 8.6* 8.5* 8.5* 8.4*  --  8.3* 8.4*  CBC Recent Labs  Lab 12/31/18 1828 01/01/19 0735 01/01/19 1359 01/02/19 0248 01/03/19 0219 01/04/19 0351  WBC  --  4.7  --  6.8 6.7 9.0  NEUTROABS 6.1  --   --   --  4.8 7.5  HGB  --  5.8* 6.8* 7.7* 7.6* 7.7*  HCT  --  19.3* 22.3* 25.2* 25.1* 25.3*  MCV  --  91.0  --  88.1 89.6 91.0  PLT  --  158  --  172 166 156    Medications:    . sodium chloride   Intravenous Once  . HYDROcodone-acetaminophen  1 tablet Oral BID  . midodrine  5 mg Oral TID WC  . pantoprazole  40 mg Oral BID  . senna-docusate  1 tablet Oral BID  . sodium chloride flush  3 mL Intravenous Q12H  . sodium chloride flush  3 mL Intravenous Q12H      [name] Lips, MD [name] pgr [phone] 01/04/2019, 11:26 AM

## 2019-01-04 NOTE — Plan of Care (Signed)

## 2019-01-04 NOTE — Progress Notes (Signed)
Patient ID: COOKIE PORE, female   DOB: [birth year], 73 y.o.   MRN: [id number]  PROGRESS NOTE    THAI HEMRICK  GMW:[id number] DOB: 18-Dec-1945 DOA: 12/31/2018 PCP: [name], MD   Brief Narrative:  73 year old female with history of chronic combined CHF, chronic renal disease stage IV, hypertension, recurrent right pleural effusion, iron deficiency anemia, recent admission from 12/05/2018-12/06/2018 for anemia presumed GI bleeding for which she required 4 units packed red cells transfusion and largely negative GI work-up presented on 12/31/2018 with worsening fatigue.  Hemoglobin was found to be 5.3 on admission.  She was transfused 2 units of packed red cells.  GI, nephrology and heart failure teams were consulted.  Assessment & Plan:   Principal Problem:   Symptomatic anemia Active Problems:   Essential hypertension   Chronic combined systolic and diastolic CHF (congestive heart failure) (HCC)   Acute renal failure superimposed on stage 4 chronic kidney disease (HCC)   Hyponatremia   Upper GI bleeding   DNR (do not resuscitate)   Weakness generalized  Recurrent symptomatic anemia/iron deficiency anemia most likely from GI bleeding in a patient with anemia of chronic disease -Status post 2 unit packed red cells transfusion during the hospitalization.  Hemoglobin 7.7 today.  Transfuse if hemoglobin is less than 7 -Apparently patient had some type of reaction to IV iron in the past as per the daughter; will hold off for now.  GI bleeding - Continue oral Protonix.  Patient is not stable for colonoscopy as per advanced heart failure team.  GI has signed off.  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  Currently on intravenous Lasix.  Received a dose of metolazone on 01/03/2019 as well.  Also on oral midodrine for soft blood pressures.  Imdur on hold.  Strict input and output.  Daily weights.  Negative balance of 4241.9 cc  since admission.  Acute kidney injury on chronic kidney disease stage IV -Nephrology following.  Creatinine 2.87 today.  Patient is not a good candidate for dialysis.  Generalized deconditioning and overall poor prognosis -Overall condition is very poor.  Palliative care evaluation appreciated.  Patient is a DNR -I think patient probably would be a good candidate for hospice.  I have told the same to the patient and have encouraged her to talk to her family.  Patient is thinking about it, not decided yet.  Recurrent right-sided pleural effusion status post right Pleurx catheter placement on 11/11/2018 -Drain Pleurx catheter every other day: Due for drainage today.  Chronic atrial fibrillation status post AV node ablation with Saint Jude PPM -Not on anticoagulation due to recurrent GI bleeding.    DVT prophylaxis: SCDs Code Status: DNR Family Communication: None at bedside.  Spoke to daughter/Janet Cheong on phone on 01/03/2019 Disposition Plan: Uncertain  Consultants:  Cardiology/GI/nephrology  Procedures: None Antimicrobials: None   Subjective: Patient seen and examined at bedside.  She is awake but a poor historian.  She denies any worsening shortness of breath, chest pain or vomiting.  No fevers. Objective: Vitals:   01/04/19 0300 01/04/19 0400 01/04/19 0500 01/04/19 0600  BP:      Pulse: (!) 106 91 (!) 103 98  Resp: 19 (!) 21 20 20   Temp: 99 F (37.2 C)     TempSrc: Axillary     SpO2: 93% 93% 95% 94%  Weight:      Height:        Intake/Output Summary (Last 24 hours) at 01/04/2019 [phone]  Last data filed at 01/03/2019 2050 Gross per 24 hour  Intake -  Output 670 ml  Net -670 ml   Filed Weights   12/31/18 2144 01/02/19 0654  Weight: 112.2 kg 110.6 kg    Examination:  General exam: No distress; looks chronically ill. Respiratory system: Bilateral decreased breath sounds at bases with scattered crackles mostly in the bases.  Cardiovascular system: S1 & S2 heard,  intermittent tachycardia. Gastrointestinal system: Abdomen is nondistended, soft and nontender. Normal bowel sounds heard. Extremities: No cyanosis; lower extremity edema present    Data Reviewed: I have personally reviewed following labs and imaging studies  CBC: Recent Labs  Lab 12/31/18 1732 12/31/18 1828 01/01/19 0735 01/01/19 1359 01/02/19 0248 01/03/19 0219 01/04/19 0351  WBC 6.0  --  4.7  --  6.8 6.7 9.0  NEUTROABS  --  6.1  --   --   --  4.8 7.5  HGB 5.3*  --  5.8* 6.8* 7.7* 7.6* 7.7*  HCT 18.0*  --  19.3* 22.3* 25.2* 25.1* 25.3*  MCV 93.3  --  91.0  --  88.1 89.6 91.0  PLT 173  --  158  --  172 166 951   Basic Metabolic Panel: Recent Labs  Lab 12/31/18 1828 01/01/19 0629 01/01/19 1359 01/02/19 0248 01/02/19 1350 01/03/19 0219 01/04/19 0351  NA  --  130* 130* 132*  --  132* 134*  K  --  2.7* 2.9* 2.7* 2.9* 3.1* 4.2  CL  --  89* 88* 90*  --  90* 93*  CO2  --  27 26 27   --  31 28  GLUCOSE  --  134* 176* 106*  --  101* 118*  BUN  --  160* 158* 150*  --  135* 119*  CREATININE  --  3.39* 3.34* 3.13*  --  2.93* 2.87*  CALCIUM  --  8.5* 8.5* 8.4*  --  8.3* 8.4*  MG 3.2*  --   --   --   --  2.6* 2.4   GFR: Estimated Creatinine Clearance: 22 mL/min (A) (by C-G formula based on SCr of 2.87 mg/dL (H)). Liver Function Tests: Recent Labs  Lab 12/31/18 1732  AST 21  ALT 14  ALKPHOS 59  BILITOT 0.6  PROT 5.5*  ALBUMIN 2.8*   No results for input(s): LIPASE, AMYLASE in the last 168 hours. No results for input(s): AMMONIA in the last 168 hours. Coagulation Profile: Recent Labs  Lab 12/31/18 2238  INR 1.1   Cardiac Enzymes: Recent Labs  Lab 12/31/18 2238 01/01/19 0629  TROPONINI 0.04* 0.04*   BNP (last 3 results) No results for input(s): PROBNP in the last 8760 hours. HbA1C: No results for input(s): HGBA1C in the last 72 hours. CBG: No results for input(s): GLUCAP in the last 168 hours. Lipid Profile: No results for input(s): CHOL, HDL, LDLCALC,  TRIG, CHOLHDL, LDLDIRECT in the last 72 hours. Thyroid Function Tests: No results for input(s): TSH, T4TOTAL, FREET4, T3FREE, THYROIDAB in the last 72 hours. Anemia Panel: No results for input(s): VITAMINB12, FOLATE, FERRITIN, TIBC, IRON, RETICCTPCT in the last 72 hours. Sepsis Labs: No results for input(s): PROCALCITON, LATICACIDVEN in the last 168 hours.  Recent Results (from the past 240 hour(s))  MRSA PCR Screening     Status: None   Collection Time: 12/31/18  9:58 PM  Result Value Ref Range Status   MRSA by PCR NEGATIVE NEGATIVE Final    Comment:        The GeneXpert MRSA  Assay (FDA approved for NASAL specimens only), is one component of a comprehensive MRSA colonization surveillance program. It is not intended to diagnose MRSA infection nor to guide or monitor treatment for MRSA infections. Performed at [facility], [place] [address] [place], [place] [address]          Radiology Studies: No results found.      Scheduled Meds: . sodium chloride   Intravenous Once  . HYDROcodone-acetaminophen  1 tablet Oral BID  . midodrine  5 mg Oral TID WC  . pantoprazole  40 mg Oral BID  . senna-docusate  1 tablet Oral BID  . sodium chloride flush  3 mL Intravenous Q12H  . sodium chloride flush  3 mL Intravenous Q12H   Continuous Infusions: . sodium chloride    . furosemide 120 mg (01/04/19 0856)     LOS: 3 days        Aline August, MD [facility] 01/04/2019, 9:48 AM

## 2019-01-04 NOTE — Progress Notes (Signed)
Occupational Therapy Evaluation Patient Details Name: [name] [name] MRN: [id number] DOB: 07-Apr-1946 Today's Date: 01/04/2019    History of Present Illness Pt is a 73 y/o female admitted secondary to low hemoglobin likely from GI bleed. Pt awaiting colonoscopy, however, pt also with CHF and awaiting cardiac clearance. PMH includes CHF, CKD, and has R pleurx catheter.    Clinical Impression   PTA, pt was living at home alone, and received some assistance with cooking and cleaning from her daughters PRN. Pt reports she was mod independent with functional mobility at wc level. Pt on RA throughout session, SpO2 96%-78%, unsure accuracy of O2 reading during activity as pt required heavy use of right hand and had no reports of dizziness or lightheaded. Pt currently requires minguard for ADL and functional mobility to transfer from EOB to [facility]. Due to decline in current level of function, pt would benefit from acute OT to address established goals to facilitate safe D/C to venue listed below. At this time, recommend HHOT follow-up. Will continue to follow acutely.     Follow Up Recommendations  Home health OT;Supervision - Intermittent    Equipment Recommendations       Recommendations for Other Services       Precautions / Restrictions Precautions Precautions: Fall Restrictions Weight Bearing Restrictions: No      Mobility Bed Mobility Overal bed mobility: Needs Assistance Bed Mobility: Sit to Supine;Supine to Sit     Supine to sit: HOB elevated Sit to supine: Supervision      Transfers Overall transfer level: Needs assistance Equipment used: None Transfers: Stand Pivot Transfers Sit to Stand: Min guard Stand pivot transfers: Min guard            Balance Overall balance assessment: Needs assistance Sitting-balance support: No upper extremity supported;Feet supported Sitting balance-Leahy Scale: Good     Standing balance support: Single extremity supported;During  functional activity Standing balance-Leahy Scale: Poor Standing balance comment: Reliant on UE support during stand pivot transfer                           ADL either performed or assessed with clinical judgement   ADL Overall ADL's : At baseline;Needs assistance/impaired Eating/Feeding: Set up;Sitting   Grooming: Set up;Sitting   Upper Body Bathing: Set up;Sitting   Lower Body Bathing: Moderate assistance   Upper Body Dressing : Set up;Sitting   Lower Body Dressing: Moderate assistance Lower Body Dressing Details (indicate cue type and reason): modA to don over feet, pt minguard for sit<>stand Toilet Transfer: Stand-pivot;Min Psychiatric nurse Details (indicate cue type and reason): minguard for stand-pivot transfer from EOB to bariatric BSC Toileting- Clothing Manipulation and Hygiene: Min guard;Sit to/from stand       Functional mobility during ADLs: Min guard General ADL Comments: minguard for stability      Vision         Perception     Praxis      Pertinent Vitals/Pain Pain Assessment: No/denies pain Faces Pain Scale: No hurt     Hand Dominance Right   Extremity/Trunk Assessment Upper Extremity Assessment Upper Extremity Assessment: Generalized weakness   Lower Extremity Assessment Lower Extremity Assessment: Defer to PT evaluation   Cervical / Trunk Assessment Cervical / Trunk Assessment: Kyphotic   Communication Communication Communication: No difficulties   Cognition Arousal/Alertness: Awake/alert Behavior During Therapy: WFL for tasks assessed/performed Overall Cognitive Status: Within Functional Limits for tasks assessed  General Comments  pt on RA during session, SpO2 fluctuated 95%-78%, pt with no report of lightheadedness or dizziness, unsure accuracy of O2 reading during mobility     Exercises     Shoulder Instructions      Home Living Family/patient expects to be  discharged to:: Private residence Living Arrangements: Children Available Help at Discharge: Family;Available PRN/intermittently Type of Home: House Home Access: Level entry     Home Layout: Two level;Able to live on main level with bedroom/bathroom Alternate Level Stairs-Number of Steps: flight    Bathroom Shower/Tub: Teacher, early years/pre: Handicapped height     Home Equipment: Tub bench;Walker - 2 wheels;Bedside commode;Cane - single point;Electric scooter;Hospital bed;Wheelchair - manual   Additional Comments: RW over 61 years old and needs a new one      Prior Functioning/Environment Level of Independence: Needs assistance  Gait / Transfers Assistance Needed: Reports she uses RW to ambulate into the bathroom. Uses WC for all other mobility. Reports she is independent with transfers to [facility].  ADL's / Homemaking Assistance Needed: Reports she sometimes requires assist with ADL tasks.    Comments: Pt uses WC for mobility; WC does not fit in bathroom-stands from [facility] and uses RW for few steps into bathroom        OT Problem List: Decreased strength;Decreased range of motion;Decreased activity tolerance;Impaired balance (sitting and/or standing);Decreased safety awareness;Decreased knowledge of use of DME or AE;Cardiopulmonary status limiting activity;Obesity      OT Treatment/Interventions: Self-care/ADL training;Therapeutic exercise;Energy conservation;DME and/or AE instruction;Therapeutic activities;Patient/family education;Balance training    OT Goals(Current goals can be found in the care plan section) Acute Rehab OT Goals Patient Stated Goal: to go home  OT Goal Formulation: With patient Time For Goal Achievement: 01/18/19 Potential to Achieve Goals: Good ADL Goals Pt Will Perform Grooming: with modified independence Pt Will Perform Lower Body Dressing: with modified independence Pt Will Transfer to Toilet: with modified independence Additional ADL Goal #1:  Pt will demonstrate use of 3 energy conservation strategies during ADL completion.  OT Frequency: Min 2X/week   Barriers to D/C: Decreased caregiver support  pt has assistance PRN       Co-evaluation              AM-PAC OT "6 Clicks" Daily Activity     Outcome Measure Help from another person eating meals?: None Help from another person taking care of personal grooming?: A Little Help from another person toileting, which includes using toliet, bedpan, or urinal?: A Little Help from another person bathing (including washing, rinsing, drying)?: A Little Help from another person to put on and taking off regular upper body clothing?: None Help from another person to put on and taking off regular lower body clothing?: A Little 6 Click Score: 20   End of Session Equipment Utilized During Treatment: Gait belt;Oxygen Nurse Communication: Mobility status  Activity Tolerance: Patient tolerated treatment well Patient left: in bed;with call bell/phone within reach  OT Visit Diagnosis: Unsteadiness on feet (R26.81);Other abnormalities of gait and mobility (R26.89);Muscle weakness (generalized) (M62.81)                Time: 6629-4765 OT Time Calculation (min): 19 min Charges:  OT General Charges $OT Visit: 1 Visit OT Evaluation $OT Eval Moderate Complexity: [place] OTR/L Acute Rehabilitation Services Office: [place] 01/04/2019, 11:11 AM

## 2019-01-05 LAB — BASIC METABOLIC PANEL
Anion gap: 15 (ref 5–15)
BUN: 114 mg/dL — ABNORMAL HIGH (ref 8–23)
CO2: 29 mmol/L (ref 22–32)
Calcium: 8.3 mg/dL — ABNORMAL LOW (ref 8.9–10.3)
Chloride: 87 mmol/L — ABNORMAL LOW (ref 98–111)
Creatinine, Ser: 3.1 mg/dL — ABNORMAL HIGH (ref 0.44–1.00)
GFR calc Af Amer: 16 mL/min — ABNORMAL LOW (ref >60)
GFR calc non Af Amer: 14 mL/min — ABNORMAL LOW (ref >60)
Glucose, Bld: 95 mg/dL (ref 70–99)
Potassium: 3.3 mmol/L — ABNORMAL LOW (ref 3.5–5.1)
Sodium: 131 mmol/L — ABNORMAL LOW (ref 135–145)

## 2019-01-05 LAB — CBC WITH DIFFERENTIAL/PLATELET
Abs Immature Granulocytes: 0.02 10*3/uL (ref 0.00–0.07)
Basophils Absolute: 0 10*3/uL (ref 0.0–0.1)
Basophils Relative: 0 %
Eosinophils Absolute: 0.2 10*3/uL (ref 0.0–0.5)
Eosinophils Relative: 4 %
HCT: 23.3 % — ABNORMAL LOW (ref 36.0–46.0)
Hemoglobin: 7.2 g/dL — ABNORMAL LOW (ref 12.0–15.0)
Immature Granulocytes: 0 %
Lymphocytes Relative: 13 %
Lymphs Abs: 0.8 10*3/uL (ref 0.7–4.0)
MCH: 27.7 pg (ref 26.0–34.0)
MCHC: 30.9 g/dL (ref 30.0–36.0)
MCV: 89.6 fL (ref 80.0–100.0)
Monocytes Absolute: 0.7 10*3/uL (ref 0.1–1.0)
Monocytes Relative: 11 %
Neutro Abs: 4.6 10*3/uL (ref 1.7–7.7)
Neutrophils Relative %: 72 %
Platelets: 130 10*3/uL — ABNORMAL LOW (ref 150–400)
RBC: 2.6 MIL/uL — ABNORMAL LOW (ref 3.87–5.11)
RDW: 15.6 % — ABNORMAL HIGH (ref 11.5–15.5)
WBC: 6.4 10*3/uL (ref 4.0–10.5)
nRBC: 0 % (ref 0.0–0.2)

## 2019-01-05 LAB — MAGNESIUM: Magnesium: 2.3 mg/dL (ref 1.7–2.4)

## 2019-01-05 MED ORDER — POTASSIUM CHLORIDE CRYS ER 20 MEQ PO TBCR: 20.0000 meq | EXTENDED_RELEASE_TABLET | Freq: Every day | ORAL | Status: DC

## 2019-01-05 MED ORDER — MIDODRINE HCL 5 MG PO TABS: 5.0000 mg | ORAL_TABLET | Freq: Three times a day (TID) | ORAL | 0 refills | Status: DC

## 2019-01-05 MED ORDER — TORSEMIDE 100 MG PO TABS: 100.0000 mg | ORAL_TABLET | Freq: Two times a day (BID) | ORAL | 0 refills | Status: DC

## 2019-01-05 MED ORDER — METOLAZONE 2.5 MG PO TABS: 2.5000 mg | ORAL_TABLET | ORAL | Status: AC

## 2019-01-05 MED ORDER — POLYETHYLENE GLYCOL 3350 17 G PO PACK: 17.0000 g | PACK | Freq: Every day | ORAL | Status: DC | PRN

## 2019-01-05 MED ORDER — TORSEMIDE 20 MG PO TABS
100.0000 mg | ORAL_TABLET | Freq: Two times a day (BID) | ORAL | Status: DC
  Administered 2019-01-05: 100 mg via ORAL

## 2019-01-05 MED ORDER — POTASSIUM CHLORIDE CRYS ER 20 MEQ PO TBCR
40.0000 meq | EXTENDED_RELEASE_TABLET | ORAL | Status: AC
  Administered 2019-01-05 (×2): 40 meq via ORAL
  Filled 2019-01-05 (×2): qty 2

## 2019-01-05 NOTE — Progress Notes (Signed)
[facility] Progress Note    Assessment/ Plan:   1.  AKI on CKD IV: Followed by Dr. Jimmy Footman, last seen 4/7 as OP where she was found to be vol overloaded.  AKI likely due to anemia and hypotension; CKD IV d/t cardiorenal syndrome.  I think she has some slight uremic symptoms although BUN is not as high as it's been in the past- 188-190 is the highest it's been.  BUN elevated this time likely d/t GIB; it can also be quite high in individuals with decompensated CHF.  pRBCs and augmenting BP along with Lasix has started to improve her Cr/ BUN and hemodynamics.  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  I agree with the addition of midodrine to augment BP and high-dose Lasix/ metolazone as ordered by AHF.  Appreciate palliative care c/s --> pt expressed the desire to "stay home more" today so I think further conversations can center around that.  Pt specifically said "it would be good if I could get blood when I need it instead of so low I'm about to pass out."  I would switch back to PO diuretics today if AHF in agreement   2.  Hypotension: midodrine as above  3.  GIB/ Anemia: + melanotic stools, GI consulted, previous EGD capsule without source of bleeding; c-scope on hold for now.  Hgb stable at 7.7, will give Aranesp today.       4.  Acute on chronic systolic CHF: high-dose Lasix 120 mg BID per AHF.  5.  Recurrent R pleural effusion: Pleurx drainage QOD -- last drained 4/11 and would probably not do more than 750-1L unless effusion really increasing-- this will cause fluid shifts as well and we could see hypotension and second insult of AKI.    6.  Hypokalemia: repleting  7.  Dispo: possibly hospice?  Subjective:    Cr slightly up today, wants to go home.  Still on IV Lasix   Objective:   BP 110/61   Pulse 88   Temp 98.3 F (36.8 C) (Oral)   Resp 16   Ht 5' 6"  (1.676 m)   Wt 107.9 kg   SpO2 97%    BMI 38.38 kg/m   Intake/Output Summary (Last 24 hours) at 01/05/2019 0958 Last data filed at 01/05/2019 3716 Gross per 24 hour  Intake 1563.67 ml  Output 1425 ml  Net 138.67 ml   Weight change:   Physical Exam: GEN older woman, NAD HEENT EOMI PERRL NECK JVD improving but still present PULM R Pleurx tube in place, mildly increased WOB CV RRR, + RV heave, no longer with as pronounced S3 ABD distended, improving EXT 2+ LE edema, sl better NEURO AAO x 3, no asterixis today SKIN burnished over LEs, healing blisters with eschar LLE  Imaging: No results found.  Labs: BMET Recent Labs  Lab 12/31/18 1732 01/01/19 0629 01/01/19 1359 01/02/19 0248 01/02/19 1350 01/03/19 0219 01/04/19 0351 01/05/19 0346  NA 127* 130* 130* 132*  --  132* 134* 131*  K 3.4* 2.7* 2.9* 2.7* 2.9* 3.1* 4.2 3.3*  CL 86* 89* 88* 90*  --  90* 93* 87*  CO2 27 27 26 27   --  31 28 29   GLUCOSE 162* 134* 176* 106*  --  101* 118* 95  BUN 161* 160* 158* 150*  --  135* 119* 114*  CREATININE 3.57* 3.39* 3.34* 3.13*  --  2.93* 2.87* 3.10*  CALCIUM 8.6* 8.5* 8.5*  8.4*  --  8.3* 8.4* 8.3*   CBC Recent Labs  Lab 12/31/18 1828  01/02/19 0248 01/03/19 0219 01/04/19 0351 01/05/19 0346  WBC  --    < > 6.8 6.7 9.0 6.4  NEUTROABS 6.1  --   --  4.8 7.5 4.6  HGB  --    < > 7.7* 7.6* 7.7* 7.2*  HCT  --    < > 25.2* 25.1* 25.3* 23.3*  MCV  --    < > 88.1 89.6 91.0 89.6  PLT  --    < > 172 166 156 130*   < > = values in this interval not displayed.    Medications:    . sodium chloride   Intravenous Once  . darbepoetin (ARANESP) injection - NON-DIALYSIS  200 mcg Subcutaneous Q Sat-1800  . HYDROcodone-acetaminophen  1 tablet Oral BID  . midodrine  5 mg Oral TID WC  . pantoprazole  40 mg Oral BID  . potassium chloride  40 mEq Oral Q4H  . senna-docusate  1 tablet Oral BID  . sodium chloride flush  3 mL Intravenous Q12H  . sodium chloride flush  3 mL Intravenous Q12H      Madelon Lips, MD [facility] pgr [phone] 01/05/2019, 9:58 AM

## 2019-01-05 NOTE — Plan of Care (Signed)
  Problem: Education: Goal: Ability to identify signs and symptoms of gastrointestinal bleeding will improve Outcome: Completed/Met   Problem: Education: Goal: Ability to identify signs and symptoms of gastrointestinal bleeding will improve Outcome: Completed/Met   Problem: Bowel/Gastric: Goal: Will show no signs and symptoms of gastrointestinal bleeding Outcome: Completed/Met   Problem: Fluid Volume: Goal: Will show no signs and symptoms of excessive bleeding Outcome: Completed/Met   Problem: Clinical Measurements: Goal: Complications related to the disease process, condition or treatment will be avoided or minimized Outcome: Completed/Met   Problem: Education: Goal: Knowledge of General Education information will improve Description Including pain rating scale, medication(s)/side effects and non-pharmacologic comfort measures Outcome: Completed/Met   Problem: Health Behavior/Discharge Planning: Goal: Ability to manage health-related needs will improve Outcome: Completed/Met   Problem: Clinical Measurements: Goal: Ability to maintain clinical measurements within normal limits will improve Outcome: Completed/Met Goal: Will remain free from infection Outcome: Completed/Met Goal: Diagnostic test results will improve Outcome: Completed/Met Goal: Respiratory complications will improve Outcome: Completed/Met Goal: Cardiovascular complication will be avoided Outcome: Completed/Met   Problem: Activity: Goal: Risk for activity intolerance will decrease Outcome: Completed/Met   Problem: Nutrition: Goal: Adequate nutrition will be maintained Outcome: Completed/Met   Problem: Coping: Goal: Level of anxiety will decrease Outcome: Completed/Met   Problem: Elimination: Goal: Will not experience complications related to bowel motility Outcome: Completed/Met Goal: Will not experience complications related to urinary retention Outcome: Completed/Met   Problem: Pain  Managment: Goal: General experience of comfort will improve Outcome: Completed/Met   Problem: Safety: Goal: Ability to remain free from injury will improve Outcome: Completed/Met   Problem: Skin Integrity: Goal: Risk for impaired skin integrity will decrease Outcome: Completed/Met

## 2019-01-05 NOTE — Care Management (Signed)
Spoke w patient's daughter Marcie Bal who states that she will be available today to provide transportation home. She has PleurX bottles at home.  CM informed Marcie Bal that she should expect call from nurse to inform her to come and get patient. She is agreeable to [facility]. Spoke w nurse at [facility] and faxed orders and demographics to [facility] at [phone]. No other CM needs.

## 2019-01-05 NOTE — Progress Notes (Signed)
Patient ID: [name] [name], female   DOB: 08/31/1946, 73 y.o.   MRN: [id number]  PROGRESS NOTE    [name] [name]  HDQ:[id number] DOB: Sep 29, 1945 DOA: 12/31/2018 PCP: [name], MD   Brief Narrative:  73 year old female with history of chronic combined CHF, chronic renal disease stage IV, hypertension, recurrent right pleural effusion, iron deficiency anemia, recent admission from 12/05/2018-12/06/2018 for anemia presumed GI bleeding for which she required 4 units packed red cells transfusion and largely negative GI work-up presented on 12/31/2018 with worsening fatigue.  Hemoglobin was found to be 5.3 on admission.  She was transfused 2 units of packed red cells.  GI, nephrology and heart failure teams were consulted.  Assessment & Plan:   Principal Problem:   Symptomatic anemia Active Problems:   Essential hypertension   Chronic combined systolic and diastolic CHF (congestive heart failure) (HCC)   Acute renal failure superimposed on stage 4 chronic kidney disease (HCC)   Hyponatremia   Upper GI bleeding   DNR (do not resuscitate)   Weakness generalized  Recurrent symptomatic anemia/iron deficiency anemia most likely from GI bleeding in a patient with anemia of chronic disease -Status post 2 unit packed red cells transfusion during the hospitalization.  Hemoglobin 7.2 today.  Transfuse if hemoglobin is less than 7 -Apparently patient had some type of reaction to IV iron in the past as per the daughter; will hold off for now.  GI bleeding - Continue oral Protonix.  Patient is not stable for colonoscopy as per advanced heart failure team.  GI has signed off.  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  Currently on intravenous Lasix and again getting intermittent intravenous metolazone.  Also on oral midodrine for soft blood pressures.  Imdur on hold.  Strict input and output.  Daily weights.  Negative balance of 3863.3 cc  since admission.  Hypokalemia -Replace.  Repeat a.m. labs  Acute kidney injury on chronic kidney disease stage IV -Nephrology following.  Creatinine 3.1 today.  Patient is not a good candidate for dialysis.  Generalized deconditioning and overall poor prognosis -Overall condition is very poor.  Palliative care evaluation appreciated.  Patient is a DNR -I think patient probably would be a good candidate for hospice.  I have told the same to the patient and have encouraged her to talk to her family.  Patient is thinking about it, not decided yet.  Will follow further palliative care discussions with this patient and family.  Recurrent right-sided pleural effusion status post right Pleurx catheter placement on 11/11/2018 -Drain Pleurx catheter every other day.  Chronic atrial fibrillation status post AV node ablation with Saint Jude PPM -Not on anticoagulation due to recurrent GI bleeding.    DVT prophylaxis: SCDs Code Status: DNR Family Communication: None at bedside.  Spoke to daughter/Janet Halk on phone on 01/03/2019 Disposition Plan: Uncertain  Consultants:  Cardiology/GI/nephrology  Procedures: None Antimicrobials: None   Subjective: Patient seen and examined at bedside.  She is awake but a poor historian.  No worsening shortness of breath, chest pain or vomiting.  No overnight fevers. Objective: Vitals:   01/05/19 0358 01/05/19 0400 01/05/19 0500 01/05/19 0600  BP: 110/61 110/61    Pulse: 94 94 87 88  Resp: (!) 22 (!) 23 15 16   Temp: 98.3 F (36.8 C)     TempSrc: Oral     SpO2:  92% 98% 97%  Weight:    107.9 kg  Height:  Intake/Output Summary (Last 24 hours) at 01/05/2019 0925 Last data filed at 01/05/2019 0625 Gross per 24 hour  Intake 1563.67 ml  Output 1425 ml  Net 138.67 ml   Filed Weights   01/02/19 0654 01/04/19 1033 01/05/19 0600  Weight: 110.6 kg 108 kg 107.9 kg    Examination:  General exam: No acute distress; looks chronically  ill. Respiratory system: Bilateral decreased breath sounds at bases with scattered crackles mostly in the bases; no wheezing Cardiovascular system: S1 & S2 heard, rate controlled. Gastrointestinal system: Abdomen is nondistended, soft and nontender. Normal bowel sounds heard. Extremities: No cyanosis; lower extremity edema present    Data Reviewed: I have personally reviewed following labs and imaging studies  CBC: Recent Labs  Lab 12/31/18 1828 01/01/19 0735 01/01/19 1359 01/02/19 0248 01/03/19 0219 01/04/19 0351 01/05/19 0346  WBC  --  4.7  --  6.8 6.7 9.0 6.4  NEUTROABS 6.1  --   --   --  4.8 7.5 4.6  HGB  --  5.8* 6.8* 7.7* 7.6* 7.7* 7.2*  HCT  --  19.3* 22.3* 25.2* 25.1* 25.3* 23.3*  MCV  --  91.0  --  88.1 89.6 91.0 89.6  PLT  --  158  --  172 166 156 876*   Basic Metabolic Panel: Recent Labs  Lab 12/31/18 1828  01/01/19 1359 01/02/19 0248 01/02/19 1350 01/03/19 0219 01/04/19 0351 01/05/19 0346  NA  --    < > 130* 132*  --  132* 134* 131*  K  --    < > 2.9* 2.7* 2.9* 3.1* 4.2 3.3*  CL  --    < > 88* 90*  --  90* 93* 87*  CO2  --    < > 26 27  --  31 28 29   GLUCOSE  --    < > 176* 106*  --  101* 118* 95  BUN  --    < > 158* 150*  --  135* 119* 114*  CREATININE  --    < > 3.34* 3.13*  --  2.93* 2.87* 3.10*  CALCIUM  --    < > 8.5* 8.4*  --  8.3* 8.4* 8.3*  MG 3.2*  --   --   --   --  2.6* 2.4 2.3   < > = values in this interval not displayed.   GFR: Estimated Creatinine Clearance: 20.1 mL/min (A) (by C-G formula based on SCr of 3.1 mg/dL (H)). Liver Function Tests: Recent Labs  Lab 12/31/18 1732  AST 21  ALT 14  ALKPHOS 59  BILITOT 0.6  PROT 5.5*  ALBUMIN 2.8*   No results for input(s): LIPASE, AMYLASE in the last 168 hours. No results for input(s): AMMONIA in the last 168 hours. Coagulation Profile: Recent Labs  Lab 12/31/18 2238  INR 1.1   Cardiac Enzymes: Recent Labs  Lab 12/31/18 2238 01/01/19 0629  TROPONINI 0.04* 0.04*   BNP (last  3 results) No results for input(s): PROBNP in the last 8760 hours. HbA1C: No results for input(s): HGBA1C in the last 72 hours. CBG: No results for input(s): GLUCAP in the last 168 hours. Lipid Profile: No results for input(s): CHOL, HDL, LDLCALC, TRIG, CHOLHDL, LDLDIRECT in the last 72 hours. Thyroid Function Tests: No results for input(s): TSH, T4TOTAL, FREET4, T3FREE, THYROIDAB in the last 72 hours. Anemia Panel: No results for input(s): VITAMINB12, FOLATE, FERRITIN, TIBC, IRON, RETICCTPCT in the last 72 hours. Sepsis Labs: No results for input(s): PROCALCITON, LATICACIDVEN in  the last 168 hours.  Recent Results (from the past 240 hour(s))  MRSA PCR Screening     Status: None   Collection Time: 12/31/18  9:58 PM  Result Value Ref Range Status   MRSA by PCR NEGATIVE NEGATIVE Final    Comment:        The GeneXpert MRSA Assay (FDA approved for NASAL specimens only), is one component of a comprehensive MRSA colonization surveillance program. It is not intended to diagnose MRSA infection nor to guide or monitor treatment for MRSA infections. Performed at [facility], [place] [address], [place], [place] [address]          Radiology Studies: No results found.      Scheduled Meds:  sodium chloride   Intravenous Once   darbepoetin (ARANESP) injection - NON-DIALYSIS  200 mcg Subcutaneous Q Sat-1800   HYDROcodone-acetaminophen  1 tablet Oral BID   midodrine  5 mg Oral TID WC   pantoprazole  40 mg Oral BID   potassium chloride  40 mEq Oral Q4H   senna-docusate  1 tablet Oral BID   sodium chloride flush  3 mL Intravenous Q12H   sodium chloride flush  3 mL Intravenous Q12H   Continuous Infusions:  sodium chloride     furosemide Stopped (01/04/19 1942)     LOS: 4 days        Aline August, MD [facility] 01/05/2019, 9:25 AM

## 2019-01-05 NOTE — Discharge Summary (Signed)
Physician Discharge Summary  [name] [name]:[id number] DOB: 12/11/1945 DOA: 12/31/2018  PCP: [name], MD  Admit date: 12/31/2018 Discharge date: 01/05/2019  Admitted From: Home Disposition: Home  Recommendations for Outpatient Follow-up:  1. Follow up with PCP in 1 week with repeat CBC/BMP 2. Outpatient follow-up with heart failure team, nephrology 3. Patient will benefit from outpatient evaluation and follow-up by palliative care and may be even hospice. 4. Follow up in ED if symptoms worsen or new appear   Home Health: Home health PT/OT/RN Equipment/Devices: None  Discharge Condition: Guarded to poor CODE STATUS: DNR Diet recommendation: Heart healthy/fluid restriction of up to 1200 cc a day  Brief/Interim Summary: 73 year old female with history of chronic combined CHF, chronic renal disease stage IV, hypertension, recurrent right pleural effusion, iron deficiency anemia, recent admission from 12/05/2018-12/06/2018 for anemia presumed GI bleeding for which she required 4 units packed red cells transfusion and largely negative GI work-up presented on 12/31/2018 with worsening fatigue.  Hemoglobin was found to be 5.3 on admission.  She was transfused 2 units of packed red cells.  GI, nephrology and heart failure teams were consulted.  Patient was considered a very high risk for endoscopy procedures.  GI signed off.  Hemoglobin has remained stable.  He was treated with very high doses of intravenous Lasix along with intermittent metolazone.  She has diuresed well.  Heart failure team has cleared the patient for discharge on oral diuretics.  Palliative care was also consulted.  Patient should consider home hospice but at this time she is not decided yet.  Discharge Diagnoses:  Principal Problem:   Symptomatic anemia Active Problems:   Essential hypertension   Chronic combined systolic and diastolic CHF (congestive heart failure) (HCC)   Acute renal failure superimposed on stage 4  chronic kidney disease (HCC)   Hyponatremia   Upper GI bleeding   DNR (do not resuscitate)   Weakness generalized  Recurrent symptomatic anemia/iron deficiency anemia most likely from GI bleeding in a patient with anemia of chronic disease -Status post 2 unit packed red cells transfusion during the hospitalization.  Hemoglobin 7.2 today.   -Apparently patient had some type of reaction to IV iron in the past as per the daughter; hence was not given any IV iron.  Continue oral iron supplementation on discharge.  GI bleeding - Continue oral Protonix.  Patient is not stable for colonoscopy as per advanced heart failure team.  GI has signed off.  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  Treated with very high doses of intravenous Lasix and intermittent metolazone.   Also on oral midodrine for soft blood pressures.  Imdur on hold.  Negative balance of 3873.3 cc since admission. -Patient wants to go home.  Heart failure team has cleared the patient for discharge.  Patient is to be on torsemide 100 mg twice a day along with metolazone 2.5 mg 3 times a week and midodrine 5 mg 3 times a day along with oral potassium supplementation 20 mEq daily.  Imdur will remain on hold.  Outpatient follow-up of BMP with follow-up with heart failure team.  Spoke to Dr. Aundra Dubin and he agrees that patient should benefit from home hospice if he agrees. -Spoke to the patient again regarding consideration of home hospice.  She is not decided yet.  Outpatient follow-up with palliative care team will be very helpful.  Hypokalemia -Replace.    Outpatient follow-up.  Acute kidney injury on chronic kidney disease stage IV -  Nephrology following.  Creatinine 3.1 today.  Patient is not a good candidate for dialysis. -Outpatient follow-up with nephrology.  Generalized deconditioning and overall poor prognosis -Overall condition is very poor.  Palliative care  evaluation appreciated.  Patient is a DNR -I think patient probably would be a good candidate for hospice.  I have told the same to the patient and have encouraged her to talk to her family.  Patient is thinking about it, not decided yet.    Recurrent right-sided pleural effusion status post right Pleurx catheter placement on 11/11/2018 -Drain Pleurx catheter every other day.  Outpatient follow-up  Chronic atrial fibrillation status post AV node ablation with Saint Jude PPM -Not on anticoagulation due to recurrent GI bleeding.  Discharge Instructions  Discharge Instructions    (HEART FAILURE PATIENTS) Call MD:  Anytime you have any of the following symptoms: 1) 3 pound weight gain in 24 hours or 5 pounds in 1 week 2) shortness of breath, with or without a dry hacking cough 3) swelling in the hands, feet or stomach 4) if you have to sleep on extra pillows at night in order to breathe.   Complete by:  As directed    AMB referral to CHF clinic   Complete by:  As directed    CHF followup   Call MD for:  difficulty breathing, headache or visual disturbances   Complete by:  As directed    Call MD for:  extreme fatigue   Complete by:  As directed    Diet - low sodium heart healthy   Complete by:  As directed    Increase activity slowly   Complete by:  As directed      Allergies as of 01/05/2019      Reactions   Beta Adrenergic Blockers Anaphylaxis, Other (See Comments)   "DEATH" (Bradycardia and her organs shut down)   Cephalexin Shortness Of Breath   Codeine Anaphylaxis, Swelling   Throat swelling   Contrast Media [iodinated Diagnostic Agents] Other (See Comments)   Patient states "I have chronic kidney disease, so the doctor said no dye in my veins."   Coreg [carvedilol] Other (See Comments)   Beta Blockers cause her organs to shut down (per patient)   Peppermint Flavor Shortness Of Breath   Prednisone Anaphylaxis, Shortness Of Breath, Swelling   Throat swells    Tape Other (See  Comments)   States plastic tape blisters her skin   Ciprofloxacin Hives   Tolerating levofloxin    Latex Itching, Rash   Penicillins Hives   DID THE REACTION INVOLVE: Swelling of the face/tongue/throat, SOB, or low BP? Yes Sudden or severe rash/hives, skin peeling, or the inside of the mouth or nose? Yes Did it require medical treatment? Pt was in hospital at the time of reaction When did it last happen?Was in her mid-40's If all above answers are "NO", may proceed with cephalosporin use.   Iron Other (See Comments)   Per Patient- Felt like skin was crawling, foaming at mouth, and felt like she was going "crazy"   Diamox [acetazolamide] Rash   Rash after 2 doses of diamox       Medication List    STOP taking these medications   isosorbide mononitrate 30 MG 24 hr tablet Commonly known as:  Imdur   methocarbamol 500 MG tablet Commonly known as:  ROBAXIN     TAKE these medications   allopurinol 300 MG tablet Commonly known as:  ZYLOPRIM Take 300 mg by mouth every  evening.   aspirin EC 325 MG tablet Take 325 mg by mouth every other day.   calcitRIOL 0.25 MCG capsule Commonly known as:  ROCALTROL Take 0.25 mcg by mouth every other day.   COD LIVER OIL PO Take 1 capsule by mouth daily.   docusate sodium 100 MG capsule Commonly known as:  COLACE Take 100 mg by mouth daily as needed for mild constipation.   feeding supplement Liqd Take 1 Container by mouth 2 (two) times daily.   ferrous sulfate 325 (65 FE) MG tablet Take 325 mg by mouth at bedtime.   HYDROcodone-acetaminophen 10-325 MG tablet Commonly known as:  NORCO Take 1 tablet by mouth See admin instructions. Take 1 tablet by mouth two times a day and an additional 0.5 tablet two times a day as needed for pain   metolazone 2.5 MG tablet Commonly known as:  ZAROXOLYN Take 1 tablet (2.5 mg total) by mouth See admin instructions. Take 1 tablet by mouth every Monday, Wednesday, Friday. What changed:  additional  instructions   midodrine 5 MG tablet Commonly known as:  PROAMATINE Take 1 tablet (5 mg total) by mouth 3 (three) times daily with meals.   Misc. Devices Misc 1 each by Does not apply route at bedtime. C-PAP   pantoprazole 40 MG tablet Commonly known as:  PROTONIX Take 1 tablet (40 mg total) by mouth 2 (two) times daily.   polyethylene glycol 17 g packet Commonly known as:  MIRALAX / GLYCOLAX Take 17 g by mouth daily as needed. What changed:    when to take this  reasons to take this   potassium chloride SA 20 MEQ tablet Commonly known as:  K-DUR,KLOR-CON Take 1 tablet (20 mEq total) by mouth daily. Taking 49mq twice daily = 423m on Monday, Wednesday, Friday. What changed:  when to take this   torsemide 100 MG tablet Commonly known as:  DEMADEX Take 1 tablet (100 mg total) by mouth 2 (two) times daily. What changed:    medication strength  See the new instructions.   vitamin B-12 500 MCG tablet Commonly known as:  CYANOCOBALAMIN Take 500 mcg by mouth daily.      [place] up.   Contact information: [phone]      KoLujean AmelMD. Schedule an appointment as soon as possible for a visit in 1 week(s).   Specialty:  Family Medicine Why:  with repeat cbc/bmp  Contact information: [phone]        RoFay RecordsMD .   Specialty:  Cardiology Contact information: [phone]        TaEvans LanceMD .   Specialty:  Cardiology Contact information: [phone]. [address] [phone]        Palliative care. Schedule an appointment as soon as possible for a visit in 1 week(s).        DeMauricia AreaMD. Schedule an appointment as soon as possible for a visit in 1 week(s).   Specialty:   Nephrology Contact information: [address] [place] [phone]        Allergies  Allergen Reactions  . Beta Adrenergic Blockers Anaphylaxis and Other (See Comments)    "DEATH" (Bradycardia and her organs shut down)  . Cephalexin Shortness Of  Breath  . Codeine Anaphylaxis and Swelling    Throat swelling  . Contrast Media [Iodinated Diagnostic Agents] Other (See Comments)    Patient states "I have chronic kidney disease, so the doctor said no dye in my veins."  . Coreg [Carvedilol] Other (See Comments)    Beta Blockers cause her organs to shut down (per patient)  . Peppermint Flavor Shortness Of Breath  . Prednisone Anaphylaxis, Shortness Of Breath and Swelling    Throat swells   . Tape Other (See Comments)    States plastic tape blisters her skin  . Ciprofloxacin Hives    Tolerating levofloxin   . Latex Itching and Rash  . Penicillins Hives    DID THE REACTION INVOLVE: Swelling of the face/tongue/throat, SOB, or low BP? Yes Sudden or severe rash/hives, skin peeling, or the inside of the mouth or nose? Yes Did it require medical treatment? Pt was in hospital at the time of reaction When did it last happen?Was in her mid-40's If all above answers are "NO", may proceed with cephalosporin use.  . Iron Other (See Comments)    Per Patient- Felt like skin was crawling, foaming at mouth, and felt like she was going "crazy"  . Diamox [Acetazolamide] Rash    Rash after 2 doses of diamox     Consultations: Cardiology/GI/nephrology/palliative care   Procedures/Studies: Dg Chest Portable 1 View  Result Date: 12/31/2018 CLINICAL DATA:  Anemia. Epigastric region pain. History of atrial fibrillation EXAM: PORTABLE CHEST 1 VIEW COMPARISON:  December 02, 2018 FINDINGS: There is scarring in the lateral right base. There is no appreciable edema or consolidation. There is generalized cardiomegaly, stable. Pulmonary vascularity is normal. Pacemaker leads attached to the right  ventricle. There is aortic atherosclerosis. No adenopathy. No pneumothorax. No bone lesions. IMPRESSION: Scarring lateral right base. No edema or consolidation. Generalized cardiomegaly. Pacemaker lead attached to right ventricle. Aortic Atherosclerosis (ICD10-I70.0). Electronically Signed   By: Lowella Grip III M.D.   On: 12/31/2018 21:31       Subjective: Patient seen and examined at bedside.  She is awake .  No worsening shortness of breath, chest pain or vomiting.  No overnight fevers.  She was to go home today.  Discharge Exam: Vitals:   01/05/19 0500 01/05/19 0600  BP:    Pulse: 87 88  Resp: 15 16  Temp:    SpO2: 98% 97%    General exam: No acute distress; looks chronically ill. Respiratory system: Bilateral decreased breath sounds at bases with scattered crackles mostly in the bases; no wheezing Cardiovascular system: S1 & S2 heard, rate controlled. Gastrointestinal system: Abdomen is nondistended, soft and nontender. Normal bowel sounds heard. Extremities: No cyanosis; lower extremity edema present    The results of significant diagnostics from this hospitalization (including imaging, microbiology, ancillary and laboratory) are listed below for reference.     Microbiology: Recent Results (from the past 240 hour(s))  MRSA PCR Screening     Status: None   Collection Time: 12/31/18  9:58 PM  Result Value Ref Range Status   MRSA by PCR NEGATIVE NEGATIVE Final    Comment:        The GeneXpert MRSA Assay (FDA approved for NASAL specimens only), is one component of a comprehensive MRSA colonization surveillance program. It is not intended to diagnose MRSA infection nor to guide or monitor treatment for MRSA infections. Performed at [facility], [place] [address], [place], [place] [address]      Labs: BNP (last  3 results) Recent Labs    06/20/18 2249 10/06/18 1309 11/05/18 2224  BNP 160.6* 214.8* 629.5*   Basic Metabolic Panel: Recent Labs  Lab  12/31/18 1828  01/01/19 1359 01/02/19 0248 01/02/19 1350 01/03/19 0219 01/04/19 0351 01/05/19 0346  NA  --    < > 130* 132*  --  132* 134* 131*  K  --    < > 2.9* 2.7* 2.9* 3.1* 4.2 3.3*  CL  --    < > 88* 90*  --  90* 93* 87*  CO2  --    < > 26 27  --  31 28 29   GLUCOSE  --    < > 176* 106*  --  101* 118* 95  BUN  --    < > 158* 150*  --  135* 119* 114*  CREATININE  --    < > 3.34* 3.13*  --  2.93* 2.87* 3.10*  CALCIUM  --    < > 8.5* 8.4*  --  8.3* 8.4* 8.3*  MG 3.2*  --   --   --   --  2.6* 2.4 2.3   < > = values in this interval not displayed.   Liver Function Tests: Recent Labs  Lab 12/31/18 1732  AST 21  ALT 14  ALKPHOS 59  BILITOT 0.6  PROT 5.5*  ALBUMIN 2.8*   No results for input(s): LIPASE, AMYLASE in the last 168 hours. No results for input(s): AMMONIA in the last 168 hours. CBC: Recent Labs  Lab 12/31/18 1828 01/01/19 0735 01/01/19 1359 01/02/19 0248 01/03/19 0219 01/04/19 0351 01/05/19 0346  WBC  --  4.7  --  6.8 6.7 9.0 6.4  NEUTROABS 6.1  --   --   --  4.8 7.5 4.6  HGB  --  5.8* 6.8* 7.7* 7.6* 7.7* 7.2*  HCT  --  19.3* 22.3* 25.2* 25.1* 25.3* 23.3*  MCV  --  91.0  --  88.1 89.6 91.0 89.6  PLT  --  158  --  172 166 156 130*   Cardiac Enzymes: Recent Labs  Lab 12/31/18 2238 01/01/19 0629  TROPONINI 0.04* 0.04*   BNP: Invalid input(s): POCBNP CBG: No results for input(s): GLUCAP in the last 168 hours. D-Dimer No results for input(s): DDIMER in the last 72 hours. Hgb A1c No results for input(s): HGBA1C in the last 72 hours. Lipid Profile No results for input(s): CHOL, HDL, LDLCALC, TRIG, CHOLHDL, LDLDIRECT in the last 72 hours. Thyroid function studies No results for input(s): TSH, T4TOTAL, T3FREE, THYROIDAB in the last 72 hours.  Invalid input(s): FREET3 Anemia work up No results for input(s): VITAMINB12, FOLATE, FERRITIN, TIBC, IRON, RETICCTPCT in the last 72 hours. Urinalysis    Component Value Date/Time   COLORURINE STRAW (A)  12/31/2018 2015   APPEARANCEUR CLEAR 12/31/2018 2015   LABSPEC 1.009 12/31/2018 2015   PHURINE 6.0 12/31/2018 2015   GLUCOSEU NEGATIVE 12/31/2018 2015   HGBUR SMALL (A) 12/31/2018 2015   BILIRUBINUR NEGATIVE 12/31/2018 2015   KETONESUR NEGATIVE 12/31/2018 2015   PROTEINUR NEGATIVE 12/31/2018 2015   UROBILINOGEN 0.2 03/06/2013 2035   NITRITE NEGATIVE 12/31/2018 2015   LEUKOCYTESUR MODERATE (A) 12/31/2018 2015   Sepsis Labs Invalid input(s): PROCALCITONIN,  WBC,  LACTICIDVEN Microbiology Recent Results (from the past 240 hour(s))  MRSA PCR Screening     Status: None   Collection Time: 12/31/18  9:58 PM  Result Value Ref Range Status   MRSA by PCR NEGATIVE NEGATIVE Final  Comment:        The GeneXpert MRSA Assay (FDA approved for NASAL specimens only), is one component of a comprehensive MRSA colonization surveillance program. It is not intended to diagnose MRSA infection nor to guide or monitor treatment for MRSA infections. Performed at [facility], [place] [address] [place], [place] [address]      Time coordinating discharge: 35 minutes  SIGNED:   Aline August, MD  [facility] 01/05/2019, 11:11 AM

## 2019-01-05 NOTE — Progress Notes (Addendum)
Nsg Discharge Note  Admit Date:  12/31/2018 Discharge date: 01/05/2019   ANNALESE STINER to be D/C'd Home per MD order.  AVS completed.  Copy for chart, and copy for patient signed, and dated. Patient/caregiver able to verbalize understanding.  Discharge Medication: Allergies as of 01/05/2019      Reactions   Beta Adrenergic Blockers Anaphylaxis, Other (See Comments)   "DEATH" (Bradycardia and her organs shut down)   Cephalexin Shortness Of Breath   Codeine Anaphylaxis, Swelling   Throat swelling   Contrast Media [iodinated Diagnostic Agents] Other (See Comments)   Patient states "I have chronic kidney disease, so the doctor said no dye in my veins."   Coreg [carvedilol] Other (See Comments)   Beta Blockers cause her organs to shut down (per patient)   Peppermint Flavor Shortness Of Breath   Prednisone Anaphylaxis, Shortness Of Breath, Swelling   Throat swells    Tape Other (See Comments)   States plastic tape blisters her skin   Ciprofloxacin Hives   Tolerating levofloxin    Latex Itching, Rash   Penicillins Hives   DID THE REACTION INVOLVE: Swelling of the face/tongue/throat, SOB, or low BP? Yes Sudden or severe rash/hives, skin peeling, or the inside of the mouth or nose? Yes Did it require medical treatment? Pt was in hospital at the time of reaction When did it last happen?Was in her mid-40's If all above answers are "NO", may proceed with cephalosporin use.   Iron Other (See Comments)   Per Patient- Felt like skin was crawling, foaming at mouth, and felt like she was going "crazy"   Diamox [acetazolamide] Rash   Rash after 2 doses of diamox       Medication List    STOP taking these medications   isosorbide mononitrate 30 MG 24 hr tablet Commonly known as:  Imdur   methocarbamol 500 MG tablet Commonly known as:  ROBAXIN     TAKE these medications   allopurinol 300 MG tablet Commonly known as:  ZYLOPRIM Take 300 mg by mouth every evening.   aspirin EC 325 MG  tablet Take 325 mg by mouth every other day.   calcitRIOL 0.25 MCG capsule Commonly known as:  ROCALTROL Take 0.25 mcg by mouth every other day.   COD LIVER OIL PO Take 1 capsule by mouth daily.   docusate sodium 100 MG capsule Commonly known as:  COLACE Take 100 mg by mouth daily as needed for mild constipation.   feeding supplement Liqd Take 1 Container by mouth 2 (two) times daily.   ferrous sulfate 325 (65 FE) MG tablet Take 325 mg by mouth at bedtime.   HYDROcodone-acetaminophen 10-325 MG tablet Commonly known as:  NORCO Take 1 tablet by mouth See admin instructions. Take 1 tablet by mouth two times a day and an additional 0.5 tablet two times a day as needed for pain   metolazone 2.5 MG tablet Commonly known as:  ZAROXOLYN Take 1 tablet (2.5 mg total) by mouth See admin instructions. Take 1 tablet by mouth every Monday, Wednesday, Friday. What changed:  additional instructions   midodrine 5 MG tablet Commonly known as:  PROAMATINE Take 1 tablet (5 mg total) by mouth 3 (three) times daily with meals.   Misc. Devices Misc 1 each by Does not apply route at bedtime. C-PAP   pantoprazole 40 MG tablet Commonly known as:  PROTONIX Take 1 tablet (40 mg total) by mouth 2 (two) times daily.   polyethylene glycol 17 g packet  Commonly known as:  MIRALAX / GLYCOLAX Take 17 g by mouth daily as needed. What changed:    when to take this  reasons to take this   potassium chloride SA 20 MEQ tablet Commonly known as:  K-DUR,KLOR-CON Take 1 tablet (20 mEq total) by mouth daily. Taking 55mq twice daily = 488m on Monday, Wednesday, Friday. What changed:  when to take this   torsemide 100 MG tablet Commonly known as:  DEMADEX Take 1 tablet (100 mg total) by mouth 2 (two) times daily. What changed:    medication strength  See the new instructions.   vitamin B-12 500 MCG tablet Commonly known as:  CYANOCOBALAMIN Take 500 mcg by mouth daily.       Discharge  Assessment: Vitals:   01/05/19 0500 01/05/19 0600  BP:    Pulse: 87 88  Resp: 15 16  Temp:    SpO2: 98% 97%   Skin clean, dry and intact without evidence of skin break down, no evidence of skin tears noted. IV catheter discontinued intact. Site without signs and symptoms of complications - no redness or edema noted at insertion site, patient denies c/o pain - only slight tenderness at site.  Dressing with slight pressure applied.  D/c Instructions-Education: Discharge instructions given to patient/family with verbalized understanding. D/c education completed with patient/family including follow up instructions, medication list, d/c activities limitations if indicated, with other d/c instructions as indicated by MD - patient able to verbalize understanding, all questions fully answered. Patient instructed to return to ED, call 911, or call MD for any changes in condition.  Darbepoetin injections will be set up through short stay and they will call to set this up.  Patient escorted via [facility] D/C home via private auto.   ViSalley SlaughterRN 01/05/2019 1:33 PM

## 2019-01-05 NOTE — Progress Notes (Signed)
Patient ID: [name] [name], female   DOB: 01-May-1946, 73 y.o.   MRN: [id number]     Advanced Heart Failure Rounding Note  PCP-Cardiologist: Dorris Carnes, MD   Subjective:    She had 2 units PRBCs on 4/8, hgb stable today at 7.2. She is alert/oriented today.  BUN lower but creatinine higher today with diuresis.  Weight down 10 lbs total. BP better on midodrine.    PleurX catheter drained yesterday.  Objective:   Weight Range: 107.9 kg Body mass index is 38.38 kg/m.   Vital Signs:   Temp:  [98.1 F (36.7 C)-98.8 F (37.1 C)] 98.3 F (36.8 C) (04/12 0358) Pulse Rate:  [87-106] 88 (04/12 0600) Resp:  [13-29] 16 (04/12 0600) BP: (101-124)/(38-61) 110/61 (04/12 0400) SpO2:  [91 %-100 %] 97 % (04/12 0600) Weight:  [107.9 kg] 107.9 kg (04/12 0600) Last BM Date: 01/04/19  Weight change: Filed Weights   01/02/19 0654 01/04/19 1033 01/05/19 0600  Weight: 110.6 kg 108 kg 107.9 kg    Intake/Output:   Intake/Output Summary (Last 24 hours) at 01/05/2019 1036 Last data filed at 01/05/2019 0900 Gross per 24 hour  Intake 1803.67 ml  Output 1375 ml  Net 428.67 ml      Physical Exam    General: NAD Neck: JVP 9-10 cm, no thyromegaly or thyroid nodule.  Lungs: Decreased BS at bases.  CV: Nondisplaced PMI.  Heart regular S1/S2, no S3/S4, 2/6 HSM LLSB/apex.  Trace ankle edema.   Abdomen: Soft, nontender, no hepatosplenomegaly, no distention.  Skin: Intact without lesions or rashes.  Neurologic: Alert and oriented x 3.  Psych: Normal affect. Extremities: No clubbing or cyanosis.  HEENT: Normal.    Telemetry   Atrial fibrillation with v-pacing in 90s-100s. Personally reviewed.   Labs    CBC Recent Labs    01/04/19 0351 01/05/19 0346  WBC 9.0 6.4  NEUTROABS 7.5 4.6  HGB 7.7* 7.2*  HCT 25.3* 23.3*  MCV 91.0 89.6  PLT 156 093*   Basic Metabolic Panel Recent Labs    01/04/19 0351 01/05/19 0346  NA 134* 131*  K 4.2 3.3*  CL 93* 87*  CO2 28 29  GLUCOSE 118* 95   BUN 119* 114*  CREATININE 2.87* 3.10*  CALCIUM 8.4* 8.3*  MG 2.4 2.3   Liver Function Tests No results for input(s): AST, ALT, ALKPHOS, BILITOT, PROT, ALBUMIN in the last 72 hours. No results for input(s): LIPASE, AMYLASE in the last 72 hours. Cardiac Enzymes No results for input(s): CKTOTAL, CKMB, CKMBINDEX, TROPONINI in the last 72 hours.  BNP: BNP (last 3 results) Recent Labs    06/20/18 2249 10/06/18 1309 11/05/18 2224  BNP 160.6* 214.8* 228.9*    ProBNP (last 3 results) No results for input(s): PROBNP in the last 8760 hours.   D-Dimer No results for input(s): DDIMER in the last 72 hours. Hemoglobin A1C No results for input(s): HGBA1C in the last 72 hours. Fasting Lipid Panel No results for input(s): CHOL, HDL, LDLCALC, TRIG, CHOLHDL, LDLDIRECT in the last 72 hours. Thyroid Function Tests No results for input(s): TSH, T4TOTAL, T3FREE, THYROIDAB in the last 72 hours.  Invalid input(s): FREET3  Other results:   Imaging    No results found.   Medications:     Scheduled Medications: . sodium chloride   Intravenous Once  . darbepoetin (ARANESP) injection - NON-DIALYSIS  200 mcg Subcutaneous Q Sat-1800  . HYDROcodone-acetaminophen  1 tablet Oral BID  . midodrine  5 mg Oral TID WC  .  pantoprazole  40 mg Oral BID  . potassium chloride  40 mEq Oral Q4H  . senna-docusate  1 tablet Oral BID  . sodium chloride flush  3 mL Intravenous Q12H  . sodium chloride flush  3 mL Intravenous Q12H    Infusions: . sodium chloride      PRN Medications: sodium chloride, fentaNYL (SUBLIMAZE) injection, HYDROcodone-acetaminophen, ondansetron **OR** ondansetron (ZOFRAN) IV, polyethylene glycol, sodium chloride flush   Assessment/Plan   1. Acute on chronic systolic CHF: Suspected nonischemic cardiomyopathy, RV pacing may contribute. Most recent echo in 1/20 with EF 30-35%, mild AS, moderate MR, moderate TR, moderately dilated RV. 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  Complicated by CKD stage IV.  BP better on midodrine, generally SBP 100s. Weight is down 10 lbs total with diuresis.  BUN lower today but creatinine higher at 3.1.  Still some evidence for volume overload on exam, but given significant RV failure, I do not think that we will get CVP to normal range.  I think we have done as much aggressive diuresis as she will tolerate.   - Transition to torsemide 100 mg bid (was on 80 mg bid at home).  - Continue midodrine 5 mg tid.   2. AKI on CKD stage IV: In setting of both GI bleeding and volume overload.  Followed by Dr. Jimmy Footman as outpatient.  BUN/creatinine up to 161/3.57 at admission, several weeks ago 92/2.61. Upper GI bleeding may be playing a role in elevated BUN as well.  Dialysis would be very difficult for her given significant RV failure and baseline soft BP.  I was concerned for uremia initially with drowsiness/lethargy, much improved last couple of days.  BUN lower but creatinine up some today to 3.1.  - Continue midodrine.   - Stop IV diuresis and transition to po.  - Appreciate nephrology assistance.   3. GI bleed: Recurrent upper GI bleeding of uncertain etiology.  She is not on anticoagulation. She was admitted with hgb 5.4, got 2 units PRBCs on 4/8 with hgb 7.2 today. EGD and capsule study unrevealing in 1/20. GI has seen.  - Colonoscopy recommended given recurrent bleeding from uncertain source.  Patient would be high risk for colonoscopy and does not want another invasive procedure. Now that she is starting to think about home hospice, would hold off.  - She is iron deficient but apparently had a reaction to iron infusion in the past.  - Would try to have her transfused as an outpatient in the future.  4. Atrial fibrillation: Chronic, s/p AV nodal ablation with St Jude PPM.  She is not anticoagulated due to recurrent GI bleeding.  5. Right PleurX catheter: Qod drainage, drained yesterday.  6. Palliative care following. Patient  thinking about home hospice. I think that hospice care would be appropriate for her.  She is interested in staying at home and not coming back to the hospital.  She wants to go home today.  I think this would be ok.  In the future, if she needs transfusion, would try to do as outpatient. Cardiac meds for home: torsemide 100 mg bid, midodrine 5 mg tid, KCl 20 daily.  She will need telehealth followup with CHF in 1 week and BMET/CBC in 1 week.   Length of Stay: Crowder, MD  01/05/2019, 10:36 AM  Advanced Heart Failure Team Pager [phone] (M-F; 7a - 4p)  Please contact [facility] for night-coverage after hours (4p -7a ) and weekends on [email or web address]

## 2019-01-05 NOTE — Progress Notes (Signed)
    Patient ID: [name] [name], female   DOB: [birth year], 73 y.o.   MRN: [id number]  This NP visited patient at the bedside as a follow up for palliative medicine needs and emotional support.  Patient is awake and alert and "feels better today."  She tells me she hopes to be discharged home in the next couple of days.  We discussed anticipatory care needs and high risk of decompensation secondary to multiple comorbidities.  Placed call to daughter/Janet and discussed the same concerns regarding disposition home.  Discussed with patient and her daughter the importance of continued conversation with each other and the medical providers regarding overall plan of care and treatment options,  ensuring decisions are within the context of the patients values and GOCs.  We discussed the concept of mortality and the limitations of medical interventions to prolong life when the body begins to fail to thrive.  We discussed hospice benefit and outpatient palliative community-based services.  Questions and concerns addressed   Discussed with Dr Starla Link  Total time spent on the unit was 35 minutes   Greater than 50% of the time was spent in counseling and coordination of care  [name] NP  Palliative Medicine Team Team Phone # [phone] Pager [phone]

## 2019-01-07 ENCOUNTER — Other Ambulatory Visit: Payer: Self-pay

## 2019-01-08 DIAGNOSIS — I4821 Permanent atrial fibrillation: Secondary | ICD-10-CM | POA: Diagnosis not present

## 2019-01-08 DIAGNOSIS — K922 Gastrointestinal hemorrhage, unspecified: Secondary | ICD-10-CM | POA: Diagnosis not present

## 2019-01-08 DIAGNOSIS — D500 Iron deficiency anemia secondary to blood loss (chronic): Secondary | ICD-10-CM | POA: Diagnosis not present

## 2019-01-08 DIAGNOSIS — I5042 Chronic combined systolic (congestive) and diastolic (congestive) heart failure: Secondary | ICD-10-CM | POA: Diagnosis not present

## 2019-01-08 DIAGNOSIS — I130 Hypertensive heart and chronic kidney disease with heart failure and stage 1 through stage 4 chronic kidney disease, or unspecified chronic kidney disease: Secondary | ICD-10-CM | POA: Diagnosis not present

## 2019-01-08 DIAGNOSIS — N184 Chronic kidney disease, stage 4 (severe): Secondary | ICD-10-CM | POA: Diagnosis not present

## 2019-01-09 ENCOUNTER — Other Ambulatory Visit: Payer: Self-pay

## 2019-01-09 ENCOUNTER — Ambulatory Visit (HOSPITAL_COMMUNITY)
Admission: RE | Admit: 2019-01-09 | Discharge: 2019-01-09 | Disposition: A | Discharge status: Home / Self Care | Payer: Medicare Other | Source: Ambulatory Visit | Attending: Cardiology | Admitting: Cardiology

## 2019-01-09 DIAGNOSIS — I4821 Permanent atrial fibrillation: Secondary | ICD-10-CM

## 2019-01-09 DIAGNOSIS — N184 Chronic kidney disease, stage 4 (severe): Secondary | ICD-10-CM

## 2019-01-09 DIAGNOSIS — I5022 Chronic systolic (congestive) heart failure: Secondary | ICD-10-CM

## 2019-01-09 DIAGNOSIS — J90 Pleural effusion, not elsewhere classified: Secondary | ICD-10-CM

## 2019-01-09 DIAGNOSIS — K922 Gastrointestinal hemorrhage, unspecified: Secondary | ICD-10-CM

## 2019-01-09 NOTE — Progress Notes (Signed)
Spoke to patient about after visit summary instructions. Pt verbalized understanding. Spoke to pt home health nurse Vaughan Basta with [facility]. Script for labs to be faxed to them. Pt denies further needs at this time.

## 2019-01-09 NOTE — Patient Instructions (Addendum)
Lab work will need to be done. This will be done at your next home health visit next Wednesday.  Follow up in 3 weeks by telephone with APP. This is scheduled for May 8th at 11:00am. This will be a telehealth phone visit

## 2019-01-09 NOTE — Addendum Note (Signed)
Encounter addended by: Marlise Eves, RN on: 01/09/2019 2:25 PM  Actions taken: Visit Navigator Flowsheet section accepted, Clinical Note Signed

## 2019-01-09 NOTE — Addendum Note (Signed)
Encounter addended by: Marlise Eves, RN on: 01/09/2019 1:46 PM  Actions taken: Clinical Note Signed

## 2019-01-09 NOTE — Progress Notes (Signed)
Heart Failure TeleHealth Note  Due to national recommendations of social distancing due to [place] 19, Audio/video telehealth visit is felt to be most appropriate for this patient at this time.  See MyChart message from today for patient consent regarding telehealth for [name].  Date:  01/09/2019   ID:  DEMAYA HARDGE, DOB 01/18/1946, MRN [id number]  Location: Home  Provider location: [place] Advanced Heart Failure Type of Visit: Established patient, hospital follow up  PCP:  [name], MD  Cardiologist:  Dorris Carnes, MD Primary HF: Dr Aundra Dubin  Chief Complaint: Hospital follow up   History of Present Illness: [name] [name] is a 73 y.o. female with a history of morbid obesity, afib s/p AV ablation andSt JudePPM, chronic combined HF, CKD IV, severe MR, TR, pulmonary HTN, OSA on BiPAP, GERD, gout, and iron deficiency anemia. Cardiac cath 2009 with normal coronary arteries   Admitted 4/7-4/12/20 with anemia, hgb down to 5.3. Required 2u PRBCs. Determined to be high risk for GI procedures and did not want to procede. HF team consulted for volume overload. Diuresed 10 lbs with IV lasix + metolazone, then transitioned to torsemide 100 mg BID. Diuresed 10 lbs. Nephrology consulted. She is a poor HD candidate. Palliative team and pt considering hospice, but did not make a decision. Will follow up with palliative care outpatient. DC weight: 237 lbs.  She presents via [employer], civil (consulting) for a telehealth visit today. Overall doing okay. Daughter visits every other day and drains pleurex, gets 60-90 mls off each time. She is also cooking for her. No more dark stools. No SOB, but not very active. Denies edema, other than a little swelling in left leg when she doesn't elevated it enough. Sleeping in a hospital bed. No dizziness. Appetite is good.  Has a HH RN on Wednesdays - [facility]. Limiting fluid and salt intake. Daughter is cooking meals for her. Taking all medications.  Weights stable 237-238 lbs. SBP 90s on home checks.   She denies symptoms worrisome for COVID 19.   Past Medical History:  Diagnosis Date  . Anemia   . Atrial fibrillation ([place])   . Cataract   . Cervical cancer ([place]) 1991   s/p hysterectomy  . Chronic cellulitis   . Chronic combined systolic and diastolic congestive heart failure, NYHA class 2 (HCC)    LVEF 25-30% with restrictive diastolic filling  . Degenerative joint disease   . Diverticulitis   . Essential hypertension   . GERD (gastroesophageal reflux disease)   . Gout   . Kidney stones   . Morbid obesity ([place])   . NICM (nonischemic cardiomyopathy) ([place]) 02/22/2017  . Osteoarthritis   . Permanent atrial fibrillation 05/04/2009   Qualifier: History of  By: Quentin Cornwall CMA, Janett Billow    . 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   Qualifier: Diagnosis of  By: Lovena Le, MD, [facility], Binnie Kand   . Sinoatrial node dysfunction [facility])    Status post PPM - Dr. Lovena Le  . Sleep apnea    CPAP   Past Surgical History:  Procedure Laterality Date  . ABDOMINAL HYSTERECTOMY  1991  . BIOPSY  09/26/2018   Procedure: BIOPSY;  Surgeon: Lavena Bullion, DO;  Location: [facility];  Service: Gastroenterology;;  . CHEST TUBE INSERTION Right 11/11/2018   Procedure: INSERTION PLEURAL DRAINAGE CATHETER;  Surgeon: Ivin Poot, MD;  Location: [facility];  Service: Thoracic;  Laterality: Right;  . COLONOSCOPY  1998   one polyp per patient  .  ESOPHAGOGASTRODUODENOSCOPY (EGD) WITH PROPOFOL N/A 09/26/2018   Procedure: ESOPHAGOGASTRODUODENOSCOPY (EGD) WITH PROPOFOL;  Surgeon: Lavena Bullion, DO;  Location: [facility];  Service: Gastroenterology;  Laterality: N/A;  . EYE SURGERY    . GIVENS CAPSULE STUDY N/A 10/09/2018   Procedure: GIVENS CAPSULE STUDY;  Surgeon: Carol Ada, MD;  Location: [facility];  Service: Endoscopy;  Laterality: N/A;  . IR THORACENTESIS ASP PLEURAL SPACE W/IMG GUIDE  10/09/2018  . PACEMAKER INSERTION  Nov 2000   St Jude  with revision in 2011  . PERCUTANEOUS NEPHROLITHOTOMY  April 2012  . RIGHT HEART CATH N/A 06/28/2018   Procedure: RIGHT HEART CATH;  Surgeon: Jolaine Artist, MD;  Location: [facility] CV LAB;  Service: Cardiovascular;  Laterality: N/A;  . TEE WITHOUT CARDIOVERSION N/A 06/28/2018   Procedure: TRANSESOPHAGEAL ECHOCARDIOGRAM (TEE);  Surgeon: Jolaine Artist, MD;  Location: [facility] ENDOSCOPY;  Service: Cardiovascular;  Laterality: N/A;     Current Outpatient Medications  Medication Sig Dispense Refill  . metolazone (ZAROXOLYN) 2.5 MG tablet Take 1 tablet (2.5 mg total) by mouth See admin instructions. Take 1 tablet by mouth every Monday, Wednesday, Friday.    . midodrine (PROAMATINE) 5 MG tablet Take 1 tablet (5 mg total) by mouth 3 (three) times daily with meals. 90 tablet 0  . potassium chloride SA (K-DUR,KLOR-CON) 20 MEQ tablet Take 1 tablet (20 mEq total) by mouth daily. Taking 74mq twice daily = 433m on Monday, Wednesday, Friday. (Patient taking differently: Take 20 mEq by mouth 2 (two) times daily. Taking 2066mtwice daily = 50m78mn Monday, Wednesday, Friday.)    . torsemide (DEMADEX) 100 MG tablet Take 1 tablet (100 mg total) by mouth 2 (two) times daily. 60 tablet 0  . allopurinol (ZYLOPRIM) 300 MG tablet Take 300 mg by mouth every evening.     . asMarland Kitchenirin EC 325 MG tablet Take 325 mg by mouth every other day.     . calcitRIOL (ROCALTROL) 0.25 MCG capsule Take 0.25 mcg by mouth every other day.     . COD LIVER OIL PO Take 1 capsule by mouth daily.     . doMarland Kitchenusate sodium (COLACE) 100 MG capsule Take 100 mg by mouth daily as needed for mild constipation.     . feeding supplement (BOOST HIGH PROTEIN) LIQD Take 1 Container by mouth 2 (two) times daily.     . ferrous sulfate 325 (65 FE) MG tablet Take 325 mg by mouth at bedtime.     . HYMarland KitchenROcodone-acetaminophen (NORCO) 10-325 MG tablet Take 1 tablet by mouth See admin instructions. Take 1 tablet by mouth two times a day and an additional 0.5  tablet two times a day as needed for pain 30 tablet 0  . Misc. Devices MISC 1 each by Does not apply route at bedtime. C-PAP    . pantoprazole (PROTONIX) 40 MG tablet Take 1 tablet (40 mg total) by mouth 2 (two) times daily. (Patient not taking: Reported on 12/31/2018) 60 tablet 5  . polyethylene glycol (MIRALAX / GLYCOLAX) 17 g packet Take 17 g by mouth daily as needed.    . vitamin B-12 (CYANOCOBALAMIN) 500 MCG tablet Take 500 mcg by mouth daily.     No current facility-administered medications for this encounter.     Allergies:   Beta adrenergic blockers; Cephalexin; Codeine; Contrast media [iodinated diagnostic agents]; Coreg [carvedilol]; Peppermint flavor; Prednisone; Tape; Ciprofloxacin; Latex; Penicillins; Iron; and Diamox [acetazolamide]   Social History:  The patient  reports that she quit smoking about  39 years ago. Her smoking use included cigarettes. She smoked 1.00 pack per day. She has never used smokeless tobacco. She reports that she does not drink alcohol or use drugs.   Family History:  The patient's family history includes Colon cancer in an other family member; Diabetes in her sister; Heart attack in her father; Stroke in her mother.   ROS:  Please see the history of present illness.   All other systems are personally reviewed and negative.   Exam:  (Video/Tele Health Call; Exam is subjective and or/visual.) General:  Speaks in full sentences. No resp difficulty. Lungs: Normal respiratory effort with conversation.  Abdomen: Non-distended per patient report Extremities: Pt denies edema. Neuro: Alert & oriented x 3.   Recent Labs: 11/05/2018: B Natriuretic Peptide 228.9 11/10/2018: TSH 2.980 12/31/2018: ALT 14 01/05/2019: BUN 114; Creatinine, Ser 3.10; Hemoglobin 7.2; Magnesium 2.3; Platelets 130; Potassium 3.3; Sodium 131  Personally reviewed   Wt Readings from Last 3 Encounters:  01/05/19 107.9 kg (237 lb 12.8 oz)  12/11/18 107 kg (236 lb)  12/02/18 112.5 kg (248 lb)       ASSESSMENT AND PLAN:  1. Chronic systolic CHF: Suspected nonischemic cardiomyopathy, RV pacing may contribute. Most recent echo in 1/20 with EF 30-35%, mild AS, moderate MR, moderate TR, moderately dilated RV. 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 Complicated by CKD stage IV.  - Volume sound stable - Continue torsemide 100 mg bid  - Continue midodrine 5 mg tid.  SBP 90s. No dizziness.   2. CKD stage IV: With recent AKI in setting of GIB and volume overload - Followed by Dr. Jimmy Footman. Poor HD candidate. - Continue midodrine.   3. GI bleed: Recurrent upper GI bleeding of uncertain etiology. She is not on anticoagulation. She was admitted with hgb 5.4, got 2 units PRBCs on 4/8 with hgb 7.2 today. EGD and capsule study unrevealing in 1/20.  - Colonoscopy recommended given recurrent bleeding from uncertain source.  Patient would be high risk for colonoscopy and does not want another invasive procedure. Now that she is starting to think about home hospice, would hold off.  - She is iron deficient but apparently had a reaction to iron infusion in the past.  - Would try to have her transfused as an outpatient in the future.  - She has f/u with Dr Oneida Alar in [place] next week. She is going to get weekly injections of a medication to help boost Hgb.   4. Atrial fibrillation: Chronic, s/p AV nodal ablation with St Jude PPM. She is not anticoagulated due to recurrent GI bleeding. No change.   5. Right PleurX catheter: Qod drainage. Getting 60-90 mls with each drain.  6. Palliative care following. Patient thinking about home hospice. Hospice care would be appropriate for her.  She is interested in staying at home and not coming back to the hospital. Will plan to transfuse as outpatient in the future if needed. - Discussed today. She is not interested in hospice at this point. She thinks she'll do okay if we can keep her Hgb up.    COVID screen The patient does not  have any symptoms that suggest any further testing/ screening at this time.  Social distancing reinforced today. She does not need refills. No food insecurity.  The following changes were made today: No medication changes. Check CBC and BMET (either at GI appointment next week or though [facility])  Recommended follow-up:  3 weeks by telephone with APP.  Today, I have spent  15 minutes with the patient with telehealth technology discussing the above issues .    Signed, Georgiana Shore, NP  01/09/2019 12:46 PM  [facility] [address] [place] [facility] [place] [address] [phone] (office) [phone] (fax)

## 2019-01-10 DIAGNOSIS — I130 Hypertensive heart and chronic kidney disease with heart failure and stage 1 through stage 4 chronic kidney disease, or unspecified chronic kidney disease: Secondary | ICD-10-CM | POA: Diagnosis not present

## 2019-01-10 DIAGNOSIS — N184 Chronic kidney disease, stage 4 (severe): Secondary | ICD-10-CM | POA: Diagnosis not present

## 2019-01-10 DIAGNOSIS — I4821 Permanent atrial fibrillation: Secondary | ICD-10-CM | POA: Diagnosis not present

## 2019-01-10 DIAGNOSIS — K922 Gastrointestinal hemorrhage, unspecified: Secondary | ICD-10-CM | POA: Diagnosis not present

## 2019-01-10 DIAGNOSIS — I5042 Chronic combined systolic (congestive) and diastolic (congestive) heart failure: Secondary | ICD-10-CM | POA: Diagnosis not present

## 2019-01-10 DIAGNOSIS — D500 Iron deficiency anemia secondary to blood loss (chronic): Secondary | ICD-10-CM | POA: Diagnosis not present

## 2019-01-12 DIAGNOSIS — I130 Hypertensive heart and chronic kidney disease with heart failure and stage 1 through stage 4 chronic kidney disease, or unspecified chronic kidney disease: Secondary | ICD-10-CM | POA: Diagnosis not present

## 2019-01-12 DIAGNOSIS — I4821 Permanent atrial fibrillation: Secondary | ICD-10-CM | POA: Diagnosis not present

## 2019-01-12 DIAGNOSIS — K922 Gastrointestinal hemorrhage, unspecified: Secondary | ICD-10-CM | POA: Diagnosis not present

## 2019-01-12 DIAGNOSIS — N184 Chronic kidney disease, stage 4 (severe): Secondary | ICD-10-CM | POA: Diagnosis not present

## 2019-01-12 DIAGNOSIS — D500 Iron deficiency anemia secondary to blood loss (chronic): Secondary | ICD-10-CM | POA: Diagnosis not present

## 2019-01-12 DIAGNOSIS — I5042 Chronic combined systolic (congestive) and diastolic (congestive) heart failure: Secondary | ICD-10-CM | POA: Diagnosis not present

## 2019-01-13 DIAGNOSIS — K922 Gastrointestinal hemorrhage, unspecified: Secondary | ICD-10-CM | POA: Diagnosis not present

## 2019-01-13 DIAGNOSIS — D500 Iron deficiency anemia secondary to blood loss (chronic): Secondary | ICD-10-CM | POA: Diagnosis not present

## 2019-01-13 DIAGNOSIS — I130 Hypertensive heart and chronic kidney disease with heart failure and stage 1 through stage 4 chronic kidney disease, or unspecified chronic kidney disease: Secondary | ICD-10-CM | POA: Diagnosis not present

## 2019-01-13 DIAGNOSIS — I4821 Permanent atrial fibrillation: Secondary | ICD-10-CM | POA: Diagnosis not present

## 2019-01-13 DIAGNOSIS — I5042 Chronic combined systolic (congestive) and diastolic (congestive) heart failure: Secondary | ICD-10-CM | POA: Diagnosis not present

## 2019-01-13 DIAGNOSIS — N184 Chronic kidney disease, stage 4 (severe): Secondary | ICD-10-CM | POA: Diagnosis not present

## 2019-01-14 DIAGNOSIS — N184 Chronic kidney disease, stage 4 (severe): Secondary | ICD-10-CM | POA: Diagnosis not present

## 2019-01-14 DIAGNOSIS — I10 Essential (primary) hypertension: Secondary | ICD-10-CM | POA: Diagnosis not present

## 2019-01-14 DIAGNOSIS — I5022 Chronic systolic (congestive) heart failure: Secondary | ICD-10-CM | POA: Diagnosis not present

## 2019-01-14 DIAGNOSIS — K7460 Unspecified cirrhosis of liver: Secondary | ICD-10-CM | POA: Diagnosis not present

## 2019-01-14 DIAGNOSIS — E876 Hypokalemia: Secondary | ICD-10-CM | POA: Diagnosis not present

## 2019-01-15 DIAGNOSIS — I4821 Permanent atrial fibrillation: Secondary | ICD-10-CM | POA: Diagnosis not present

## 2019-01-15 DIAGNOSIS — N184 Chronic kidney disease, stage 4 (severe): Secondary | ICD-10-CM | POA: Diagnosis not present

## 2019-01-15 DIAGNOSIS — D500 Iron deficiency anemia secondary to blood loss (chronic): Secondary | ICD-10-CM | POA: Diagnosis not present

## 2019-01-15 DIAGNOSIS — K922 Gastrointestinal hemorrhage, unspecified: Secondary | ICD-10-CM | POA: Diagnosis not present

## 2019-01-15 DIAGNOSIS — I5042 Chronic combined systolic (congestive) and diastolic (congestive) heart failure: Secondary | ICD-10-CM | POA: Diagnosis not present

## 2019-01-15 DIAGNOSIS — I130 Hypertensive heart and chronic kidney disease with heart failure and stage 1 through stage 4 chronic kidney disease, or unspecified chronic kidney disease: Secondary | ICD-10-CM | POA: Diagnosis not present

## 2019-01-17 ENCOUNTER — Telehealth (HOSPITAL_COMMUNITY): Payer: Self-pay

## 2019-01-17 DIAGNOSIS — I130 Hypertensive heart and chronic kidney disease with heart failure and stage 1 through stage 4 chronic kidney disease, or unspecified chronic kidney disease: Secondary | ICD-10-CM | POA: Diagnosis not present

## 2019-01-17 DIAGNOSIS — M109 Gout, unspecified: Secondary | ICD-10-CM | POA: Diagnosis not present

## 2019-01-17 DIAGNOSIS — Z7982 Long term (current) use of aspirin: Secondary | ICD-10-CM | POA: Diagnosis not present

## 2019-01-17 DIAGNOSIS — D500 Iron deficiency anemia secondary to blood loss (chronic): Secondary | ICD-10-CM | POA: Diagnosis not present

## 2019-01-17 DIAGNOSIS — I4821 Permanent atrial fibrillation: Secondary | ICD-10-CM | POA: Diagnosis not present

## 2019-01-17 DIAGNOSIS — Z6838 Body mass index (BMI) 38.0-38.9, adult: Secondary | ICD-10-CM | POA: Diagnosis not present

## 2019-01-17 DIAGNOSIS — Z8541 Personal history of malignant neoplasm of cervix uteri: Secondary | ICD-10-CM | POA: Diagnosis not present

## 2019-01-17 DIAGNOSIS — Z87891 Personal history of nicotine dependence: Secondary | ICD-10-CM | POA: Diagnosis not present

## 2019-01-17 DIAGNOSIS — Z9181 History of falling: Secondary | ICD-10-CM | POA: Diagnosis not present

## 2019-01-17 DIAGNOSIS — K7460 Unspecified cirrhosis of liver: Secondary | ICD-10-CM | POA: Diagnosis not present

## 2019-01-17 DIAGNOSIS — M1990 Unspecified osteoarthritis, unspecified site: Secondary | ICD-10-CM | POA: Diagnosis not present

## 2019-01-17 DIAGNOSIS — Z950 Presence of cardiac pacemaker: Secondary | ICD-10-CM | POA: Diagnosis not present

## 2019-01-17 DIAGNOSIS — I5042 Chronic combined systolic (congestive) and diastolic (congestive) heart failure: Secondary | ICD-10-CM | POA: Diagnosis not present

## 2019-01-17 DIAGNOSIS — K5792 Diverticulitis of intestine, part unspecified, without perforation or abscess without bleeding: Secondary | ICD-10-CM | POA: Diagnosis not present

## 2019-01-17 DIAGNOSIS — N184 Chronic kidney disease, stage 4 (severe): Secondary | ICD-10-CM | POA: Diagnosis not present

## 2019-01-17 DIAGNOSIS — G4730 Sleep apnea, unspecified: Secondary | ICD-10-CM | POA: Diagnosis not present

## 2019-01-17 DIAGNOSIS — K922 Gastrointestinal hemorrhage, unspecified: Secondary | ICD-10-CM | POA: Diagnosis not present

## 2019-01-17 DIAGNOSIS — K219 Gastro-esophageal reflux disease without esophagitis: Secondary | ICD-10-CM | POA: Diagnosis not present

## 2019-01-17 DIAGNOSIS — I429 Cardiomyopathy, unspecified: Secondary | ICD-10-CM | POA: Diagnosis not present

## 2019-01-17 NOTE — Telephone Encounter (Signed)
Lab work done 01/15/19  Wbc  6.19 Rbc   2.88 hgb    7.7 Bun    137 Cr       4.25  Instructions to hold torsemide and k for 2 days, then decrease torsemide to 75m bid. Recheck bmet & cbc in 1-2 weeks per ACaryl PinaNP-C.   Called pt. Left voicemail to call back

## 2019-01-20 MED ORDER — TORSEMIDE 20 MG PO TABS: 80.0000 mg | ORAL_TABLET | Freq: Two times a day (BID) | ORAL | 3 refills | Status: AC

## 2019-01-20 NOTE — Telephone Encounter (Signed)
Patient aware and voiced understanding  Order for repeat labs sent to [facility] CBC,BMET

## 2019-01-21 DIAGNOSIS — I4821 Permanent atrial fibrillation: Secondary | ICD-10-CM | POA: Diagnosis not present

## 2019-01-21 DIAGNOSIS — K922 Gastrointestinal hemorrhage, unspecified: Secondary | ICD-10-CM | POA: Diagnosis not present

## 2019-01-21 DIAGNOSIS — I130 Hypertensive heart and chronic kidney disease with heart failure and stage 1 through stage 4 chronic kidney disease, or unspecified chronic kidney disease: Secondary | ICD-10-CM | POA: Diagnosis not present

## 2019-01-21 DIAGNOSIS — I5042 Chronic combined systolic (congestive) and diastolic (congestive) heart failure: Secondary | ICD-10-CM | POA: Diagnosis not present

## 2019-01-21 DIAGNOSIS — N184 Chronic kidney disease, stage 4 (severe): Secondary | ICD-10-CM | POA: Diagnosis not present

## 2019-01-21 DIAGNOSIS — D500 Iron deficiency anemia secondary to blood loss (chronic): Secondary | ICD-10-CM | POA: Diagnosis not present

## 2019-01-22 ENCOUNTER — Other Ambulatory Visit: Payer: Self-pay

## 2019-01-22 ENCOUNTER — Encounter (HOSPITAL_COMMUNITY): Payer: Self-pay | Admitting: General Practice

## 2019-01-22 ENCOUNTER — Inpatient Hospital Stay (HOSPITAL_COMMUNITY)
Admission: EM | Admit: 2019-01-22 | Discharge: 2019-01-25 | DRG: 291 | Disposition: A | Discharge status: Hospice | Payer: Medicare Other | Attending: Internal Medicine | Admitting: Internal Medicine

## 2019-01-22 ENCOUNTER — Emergency Department (HOSPITAL_COMMUNITY): Payer: Medicare Other

## 2019-01-22 DIAGNOSIS — I5043 Acute on chronic combined systolic (congestive) and diastolic (congestive) heart failure: Secondary | ICD-10-CM | POA: Diagnosis present

## 2019-01-22 DIAGNOSIS — R0602 Shortness of breath: Secondary | ICD-10-CM

## 2019-01-22 DIAGNOSIS — K420 Umbilical hernia with obstruction, without gangrene: Secondary | ICD-10-CM | POA: Diagnosis not present

## 2019-01-22 DIAGNOSIS — I5042 Chronic combined systolic (congestive) and diastolic (congestive) heart failure: Secondary | ICD-10-CM | POA: Diagnosis not present

## 2019-01-22 DIAGNOSIS — Z6841 Body Mass Index (BMI) 40.0 and over, adult: Secondary | ICD-10-CM | POA: Diagnosis not present

## 2019-01-22 DIAGNOSIS — N185 Chronic kidney disease, stage 5: Secondary | ICD-10-CM | POA: Diagnosis present

## 2019-01-22 DIAGNOSIS — Z20828 Contact with and (suspected) exposure to other viral communicable diseases: Secondary | ICD-10-CM | POA: Diagnosis present

## 2019-01-22 DIAGNOSIS — I959 Hypotension, unspecified: Secondary | ICD-10-CM | POA: Diagnosis present

## 2019-01-22 DIAGNOSIS — Z515 Encounter for palliative care: Secondary | ICD-10-CM | POA: Diagnosis present

## 2019-01-22 DIAGNOSIS — R079 Chest pain, unspecified: Secondary | ICD-10-CM | POA: Diagnosis not present

## 2019-01-22 DIAGNOSIS — M109 Gout, unspecified: Secondary | ICD-10-CM | POA: Diagnosis present

## 2019-01-22 DIAGNOSIS — N179 Acute kidney failure, unspecified: Secondary | ICD-10-CM

## 2019-01-22 DIAGNOSIS — I130 Hypertensive heart and chronic kidney disease with heart failure and stage 1 through stage 4 chronic kidney disease, or unspecified chronic kidney disease: Secondary | ICD-10-CM | POA: Diagnosis not present

## 2019-01-22 DIAGNOSIS — I4821 Permanent atrial fibrillation: Secondary | ICD-10-CM | POA: Diagnosis present

## 2019-01-22 DIAGNOSIS — K922 Gastrointestinal hemorrhage, unspecified: Secondary | ICD-10-CM | POA: Diagnosis present

## 2019-01-22 DIAGNOSIS — Z66 Do not resuscitate: Secondary | ICD-10-CM | POA: Diagnosis not present

## 2019-01-22 DIAGNOSIS — I428 Other cardiomyopathies: Secondary | ICD-10-CM | POA: Diagnosis present

## 2019-01-22 DIAGNOSIS — Z91041 Radiographic dye allergy status: Secondary | ICD-10-CM

## 2019-01-22 DIAGNOSIS — N184 Chronic kidney disease, stage 4 (severe): Secondary | ICD-10-CM | POA: Diagnosis not present

## 2019-01-22 DIAGNOSIS — I2729 Other secondary pulmonary hypertension: Secondary | ICD-10-CM | POA: Diagnosis present

## 2019-01-22 DIAGNOSIS — I5082 Biventricular heart failure: Secondary | ICD-10-CM | POA: Diagnosis present

## 2019-01-22 DIAGNOSIS — Z91040 Latex allergy status: Secondary | ICD-10-CM

## 2019-01-22 DIAGNOSIS — J810 Acute pulmonary edema: Secondary | ICD-10-CM | POA: Diagnosis not present

## 2019-01-22 DIAGNOSIS — Z8249 Family history of ischemic heart disease and other diseases of the circulatory system: Secondary | ICD-10-CM

## 2019-01-22 DIAGNOSIS — I132 Hypertensive heart and chronic kidney disease with heart failure and with stage 5 chronic kidney disease, or end stage renal disease: Principal | ICD-10-CM | POA: Diagnosis present

## 2019-01-22 DIAGNOSIS — Z888 Allergy status to other drugs, medicaments and biological substances status: Secondary | ICD-10-CM

## 2019-01-22 DIAGNOSIS — Z87891 Personal history of nicotine dependence: Secondary | ICD-10-CM

## 2019-01-22 DIAGNOSIS — R34 Anuria and oliguria: Secondary | ICD-10-CM | POA: Diagnosis present

## 2019-01-22 DIAGNOSIS — G4733 Obstructive sleep apnea (adult) (pediatric): Secondary | ICD-10-CM | POA: Diagnosis present

## 2019-01-22 DIAGNOSIS — Z7982 Long term (current) use of aspirin: Secondary | ICD-10-CM

## 2019-01-22 DIAGNOSIS — B029 Zoster without complications: Secondary | ICD-10-CM | POA: Diagnosis present

## 2019-01-22 DIAGNOSIS — K429 Umbilical hernia without obstruction or gangrene: Secondary | ICD-10-CM | POA: Diagnosis present

## 2019-01-22 DIAGNOSIS — J90 Pleural effusion, not elsewhere classified: Secondary | ICD-10-CM | POA: Diagnosis not present

## 2019-01-22 DIAGNOSIS — Z885 Allergy status to narcotic agent status: Secondary | ICD-10-CM

## 2019-01-22 DIAGNOSIS — N189 Chronic kidney disease, unspecified: Secondary | ICD-10-CM

## 2019-01-22 DIAGNOSIS — Z79899 Other long term (current) drug therapy: Secondary | ICD-10-CM

## 2019-01-22 DIAGNOSIS — D631 Anemia in chronic kidney disease: Secondary | ICD-10-CM | POA: Diagnosis present

## 2019-01-22 DIAGNOSIS — Z87442 Personal history of urinary calculi: Secondary | ICD-10-CM | POA: Diagnosis not present

## 2019-01-22 DIAGNOSIS — Z03818 Encounter for observation for suspected exposure to other biological agents ruled out: Secondary | ICD-10-CM | POA: Diagnosis not present

## 2019-01-22 DIAGNOSIS — K219 Gastro-esophageal reflux disease without esophagitis: Secondary | ICD-10-CM | POA: Diagnosis present

## 2019-01-22 DIAGNOSIS — E662 Morbid (severe) obesity with alveolar hypoventilation: Secondary | ICD-10-CM | POA: Diagnosis present

## 2019-01-22 DIAGNOSIS — E871 Hypo-osmolality and hyponatremia: Secondary | ICD-10-CM | POA: Diagnosis not present

## 2019-01-22 DIAGNOSIS — I429 Cardiomyopathy, unspecified: Secondary | ICD-10-CM | POA: Diagnosis not present

## 2019-01-22 DIAGNOSIS — Z79891 Long term (current) use of opiate analgesic: Secondary | ICD-10-CM

## 2019-01-22 DIAGNOSIS — Z950 Presence of cardiac pacemaker: Secondary | ICD-10-CM

## 2019-01-22 DIAGNOSIS — Z91048 Other nonmedicinal substance allergy status: Secondary | ICD-10-CM

## 2019-01-22 DIAGNOSIS — I5023 Acute on chronic systolic (congestive) heart failure: Secondary | ICD-10-CM | POA: Diagnosis present

## 2019-01-22 DIAGNOSIS — I208 Other forms of angina pectoris: Secondary | ICD-10-CM | POA: Diagnosis not present

## 2019-01-22 DIAGNOSIS — Z880 Allergy status to penicillin: Secondary | ICD-10-CM

## 2019-01-22 DIAGNOSIS — I9589 Other hypotension: Secondary | ICD-10-CM | POA: Diagnosis present

## 2019-01-22 LAB — BASIC METABOLIC PANEL
Anion gap: 18 — ABNORMAL HIGH (ref 5–15)
BUN: 143 mg/dL — ABNORMAL HIGH (ref 8–23)
CO2: 24 mmol/L (ref 22–32)
Calcium: 8.8 mg/dL — ABNORMAL LOW (ref 8.9–10.3)
Chloride: 83 mmol/L — ABNORMAL LOW (ref 98–111)
Creatinine, Ser: 4.74 mg/dL — ABNORMAL HIGH (ref 0.44–1.00)
GFR calc Af Amer: 10 mL/min — ABNORMAL LOW (ref >60)
GFR calc non Af Amer: 9 mL/min — ABNORMAL LOW (ref >60)
Glucose, Bld: 177 mg/dL — ABNORMAL HIGH (ref 70–99)
Potassium: 3.7 mmol/L (ref 3.5–5.1)
Sodium: 125 mmol/L — ABNORMAL LOW (ref 135–145)

## 2019-01-22 LAB — BRAIN NATRIURETIC PEPTIDE: B Natriuretic Peptide: 135.3 pg/mL — ABNORMAL HIGH (ref 0.0–100.0)

## 2019-01-22 LAB — SARS CORONAVIRUS 2 BY RT PCR (HOSPITAL ORDER, PERFORMED IN ~~LOC~~ HOSPITAL LAB): SARS Coronavirus 2: NEGATIVE

## 2019-01-22 LAB — TROPONIN I
Troponin I: 0.03 ng/mL (ref <0.03)
Troponin I: 0.03 ng/mL
Troponin I: 0.03 ng/mL (ref <0.03)
Troponin I: 0.03 ng/mL

## 2019-01-22 LAB — CBC
HCT: 25.2 % — ABNORMAL LOW (ref 36.0–46.0)
Hemoglobin: 7.7 g/dL — ABNORMAL LOW (ref 12.0–15.0)
MCH: 26.6 pg (ref 26.0–34.0)
MCHC: 30.6 g/dL (ref 30.0–36.0)
MCV: 86.9 fL (ref 80.0–100.0)
Platelets: 303 10*3/uL (ref 150–400)
RBC: 2.9 MIL/uL — ABNORMAL LOW (ref 3.87–5.11)
RDW: 17.4 % — ABNORMAL HIGH (ref 11.5–15.5)
WBC: 10.6 10*3/uL — ABNORMAL HIGH (ref 4.0–10.5)
nRBC: 0 % (ref 0.0–0.2)

## 2019-01-22 MED ORDER — NEPRO/CARBSTEADY PO LIQD
237.0000 mL | Freq: Three times a day (TID) | ORAL | Status: DC | PRN
  Filled 2019-01-22: qty 237

## 2019-01-22 MED ORDER — HYDROCODONE-ACETAMINOPHEN 10-325 MG PO TABS
1.0000 | ORAL_TABLET | Freq: Two times a day (BID) | ORAL | Status: DC | PRN
  Administered 2019-01-22 – 2019-01-23 (×2): 1 via ORAL
  Filled 2019-01-22 (×2): qty 1

## 2019-01-22 MED ORDER — SODIUM CHLORIDE 0.9 % IV SOLN: 250.0000 mL | INTRAVENOUS | Status: DC | PRN

## 2019-01-22 MED ORDER — FUROSEMIDE 10 MG/ML IJ SOLN
4.0000 mg/h | INTRAVENOUS | Status: DC
  Filled 2019-01-22: qty 25

## 2019-01-22 MED ORDER — BOOST / RESOURCE BREEZE PO LIQD CUSTOM
1.0000 | Freq: Two times a day (BID) | ORAL | Status: DC
  Administered 2019-01-22 – 2019-01-24 (×2): 1 via ORAL

## 2019-01-22 MED ORDER — ONDANSETRON HCL 4 MG/2ML IJ SOLN: 4.0000 mg | Freq: Four times a day (QID) | INTRAMUSCULAR | Status: DC | PRN

## 2019-01-22 MED ORDER — FERROUS SULFATE 325 (65 FE) MG PO TABS
325.0000 mg | ORAL_TABLET | Freq: Every day | ORAL | Status: DC
  Administered 2019-01-22: 325 mg via ORAL
  Filled 2019-01-22: qty 1

## 2019-01-22 MED ORDER — HYDROXYZINE HCL 25 MG PO TABS
25.0000 mg | ORAL_TABLET | Freq: Three times a day (TID) | ORAL | Status: DC | PRN
  Administered 2019-01-22: 25 mg via ORAL
  Filled 2019-01-22: qty 1

## 2019-01-22 MED ORDER — SORBITOL 70 % SOLN: 30.0000 mL | Status: DC | PRN

## 2019-01-22 MED ORDER — SODIUM CHLORIDE 0.9% FLUSH: 3.0000 mL | INTRAVENOUS | Status: DC | PRN

## 2019-01-22 MED ORDER — HYDROCODONE-ACETAMINOPHEN 10-325 MG PO TABS
0.5000 | ORAL_TABLET | Freq: Every day | ORAL | Status: DC | PRN
  Administered 2019-01-23: 0.5 via ORAL
  Filled 2019-01-22: qty 1

## 2019-01-22 MED ORDER — ALLOPURINOL 300 MG PO TABS
300.0000 mg | ORAL_TABLET | Freq: Every evening | ORAL | Status: DC
  Administered 2019-01-22 – 2019-01-23 (×2): 300 mg via ORAL
  Filled 2019-01-22 (×2): qty 1

## 2019-01-22 MED ORDER — DOCUSATE SODIUM 283 MG RE ENEM
1.0000 | ENEMA | RECTAL | Status: DC | PRN
  Administered 2019-01-24: 283 mg via RECTAL
  Filled 2019-01-22 (×2): qty 1

## 2019-01-22 MED ORDER — PANTOPRAZOLE SODIUM 40 MG PO TBEC
40.0000 mg | DELAYED_RELEASE_TABLET | Freq: Two times a day (BID) | ORAL | Status: DC
  Administered 2019-01-22 – 2019-01-25 (×7): 40 mg via ORAL
  Filled 2019-01-22 (×8): qty 1

## 2019-01-22 MED ORDER — HYDROCODONE-ACETAMINOPHEN 5-325 MG PO TABS
1.0000 | ORAL_TABLET | Freq: Once | ORAL | Status: AC
  Administered 2019-01-22: 1 via ORAL
  Filled 2019-01-22: qty 1

## 2019-01-22 MED ORDER — FENTANYL CITRATE (PF) 100 MCG/2ML IJ SOLN
25.0000 ug | INTRAMUSCULAR | Status: DC | PRN
  Administered 2019-01-22 – 2019-01-23 (×2): 25 ug via INTRAVENOUS
  Filled 2019-01-22 (×2): qty 2

## 2019-01-22 MED ORDER — ACETAMINOPHEN 650 MG RE SUPP: 650.0000 mg | Freq: Four times a day (QID) | RECTAL | Status: DC | PRN

## 2019-01-22 MED ORDER — CYCLOBENZAPRINE HCL 5 MG PO TABS
5.0000 mg | ORAL_TABLET | Freq: Once | ORAL | Status: AC
  Administered 2019-01-22: 5 mg via ORAL
  Filled 2019-01-22: qty 1

## 2019-01-22 MED ORDER — POLYETHYLENE GLYCOL 3350 17 G PO PACK
17.0000 g | PACK | Freq: Every day | ORAL | Status: DC | PRN
  Administered 2019-01-22: 17 g via ORAL
  Filled 2019-01-22: qty 1

## 2019-01-22 MED ORDER — MIDODRINE HCL 5 MG PO TABS
5.0000 mg | ORAL_TABLET | Freq: Three times a day (TID) | ORAL | Status: DC
  Administered 2019-01-22 – 2019-01-25 (×10): 5 mg via ORAL
  Filled 2019-01-22 (×10): qty 1

## 2019-01-22 MED ORDER — HEPARIN SODIUM (PORCINE) 5000 UNIT/ML IJ SOLN
5000.0000 [IU] | Freq: Three times a day (TID) | INTRAMUSCULAR | Status: DC
  Administered 2019-01-22: 5000 [IU] via SUBCUTANEOUS
  Filled 2019-01-22: qty 1

## 2019-01-22 MED ORDER — CAMPHOR-MENTHOL 0.5-0.5 % EX LOTN: 1.0000 "application " | TOPICAL_LOTION | Freq: Three times a day (TID) | CUTANEOUS | Status: DC | PRN

## 2019-01-22 MED ORDER — SODIUM CHLORIDE 0.9% FLUSH
3.0000 mL | Freq: Two times a day (BID) | INTRAVENOUS | Status: DC
  Administered 2019-01-22 – 2019-01-25 (×7): 3 mL via INTRAVENOUS

## 2019-01-22 MED ORDER — ASPIRIN EC 325 MG PO TBEC
325.0000 mg | DELAYED_RELEASE_TABLET | ORAL | Status: DC
  Administered 2019-01-23 – 2019-01-25 (×2): 325 mg via ORAL
  Filled 2019-01-22 (×2): qty 1

## 2019-01-22 MED ORDER — ZOLPIDEM TARTRATE 5 MG PO TABS: 5.0000 mg | ORAL_TABLET | Freq: Every evening | ORAL | Status: DC | PRN

## 2019-01-22 MED ORDER — GABAPENTIN 300 MG PO CAPS
300.0000 mg | ORAL_CAPSULE | Freq: Once | ORAL | Status: AC
  Administered 2019-01-22: 300 mg via ORAL
  Filled 2019-01-22: qty 1

## 2019-01-22 MED ORDER — FENTANYL CITRATE (PF) 100 MCG/2ML IJ SOLN
50.0000 ug | Freq: Once | INTRAMUSCULAR | Status: AC
  Administered 2019-01-22: 50 ug via INTRAVENOUS
  Filled 2019-01-22: qty 2

## 2019-01-22 MED ORDER — ONDANSETRON HCL 4 MG PO TABS: 4.0000 mg | ORAL_TABLET | Freq: Four times a day (QID) | ORAL | Status: DC | PRN

## 2019-01-22 MED ORDER — ACETAMINOPHEN 325 MG PO TABS
650.0000 mg | ORAL_TABLET | Freq: Four times a day (QID) | ORAL | Status: DC | PRN
  Administered 2019-01-22: 650 mg via ORAL
  Filled 2019-01-22: qty 2

## 2019-01-22 MED ORDER — LIDOCAINE 5 % EX PTCH
1.0000 | MEDICATED_PATCH | CUTANEOUS | Status: DC
  Administered 2019-01-22 – 2019-01-25 (×4): 1 via TRANSDERMAL
  Filled 2019-01-22 (×4): qty 1

## 2019-01-22 MED ORDER — CALCIUM CARBONATE ANTACID 1250 MG/5ML PO SUSP
500.0000 mg | Freq: Four times a day (QID) | ORAL | Status: DC | PRN
  Administered 2019-01-23: 500 mg via ORAL
  Filled 2019-01-22 (×2): qty 5

## 2019-01-22 MED ORDER — BOOST HIGH PROTEIN PO LIQD
1.0000 | Freq: Two times a day (BID) | ORAL | Status: DC
  Administered 2019-01-22: 237 mL via ORAL
  Filled 2019-01-22 (×3): qty 237

## 2019-01-22 MED ORDER — CALCITRIOL 0.25 MCG PO CAPS
0.2500 ug | ORAL_CAPSULE | ORAL | Status: DC
  Administered 2019-01-23: 0.25 ug via ORAL
  Filled 2019-01-22: qty 1

## 2019-01-22 NOTE — Progress Notes (Signed)
Patient on CPAP at this time tolerating well, no assistance needed at this time.

## 2019-01-22 NOTE — Consult Note (Signed)
Consultation Note Date: 01/22/2019   Patient Name: [name] [name]  DOB: 01-11-1946  MRN: [id number]  Age / Sex: 73 y.o., female  PCP: [name], MD Referring Physician: Karmen Bongo, MD  Reason for Consultation: Establishing goals of care and Psychosocial/spiritual support  HPI/Patient Profile: 73 y.o. female   admitted on 01/22/2019 with significant past medical history  of hypertension, atrial fibrillation not on anticoagulants, GI bleeding, CHF with EF 30-35%, sinoatrial node dysfunction, PPM placement, OSA, CKD-4, GERD, gout, iron deficiency anemia, who admitted thru the ED  with chest pain and shortness of breath.    Patient has multiple comorbidities, limited viable medical intervention options to prolong quality of life, high risk for decompensation.  Patient and her family face treatment option decisions, advanced directive decisions and anticipatory care needs.   Clinical Assessment and Goals of Care:   This NP [name] reviewed medical records, received report from team, assessed the patient and then meet at the patient's bedside, and then spoke by telephone with daughter Marcie Bal to discuss diagnosis, prognosis, [place], EOL wishes disposition and options.  Concept of Hospice and Palliative Care were reintroduced.  This nurse practitioner worked with this patient and family on last hospitalization.  A detailed discussion was had today regarding advanced directives.  Concepts specific to code status, artifical feeding and hydration, continued IV antibiotics and rehospitalization was had.   We discussed her multiple comorbidities to include complex cardiac history and now deteriorating renal function.  The difference between a aggressive medical intervention path  and a palliative comfort care path for this patient at this time was had.  Values and goals of care important to patient and family  were attempted to be elicited.  Daughter verbalizes an understanding of the seriousness of the situation.  She tells me that her mother and herself have had conversations regarding plan of care however at this time the patient states "when God wants to take me that is when I am going". Daughter tells me that her mother is finding it very difficult to make any decision other than moving forward with aggressive medical interventions to prolong life.  For today goal is to,  stabilize the patient and re-address, hopefully  tomorrow when she is feeling better and more comfortable,  including her daughter by phone  for a detailed conversation regarding the current medical situation and plan of care.  Questions and concerns addressed.   Family encouraged to call with questions or concerns.    PMT will continue to support holistically.   NEXT OF KIN    SUMMARY OF RECOMMENDATIONS     Code Status/Advance Care Planning:  DNR   Additional Recommendations (Limitations, Scope, Preferences):  Full Scope Treatment  Psycho-social/Spiritual:   Desire for further Chaplaincy support:no  Additional Recommendations: Education on Hospice  Prognosis:   Unable to determine  Discharge Planning: To Be Determined      Primary Diagnoses: Present on Admission: . (Resolved) Acute on chronic systolic (congestive) heart failure ([place]) . Acute on chronic combined systolic and  diastolic congestive heart failure, NYHA class 4 ([place]) . CKD (chronic kidney disease), stage V ([place]) . OSA (obstructive sleep apnea) . Permanent atrial fibrillation . Hypotension, chronic . Anemia due to chronic kidney disease . Chest pain . Umbilical hernia   I have reviewed the medical record, interviewed the patient and family, and examined the patient. The following aspects are pertinent.  Past Medical History:  Diagnosis Date  . Anemia   . Atrial fibrillation ([place])   . Cataract   . Cervical cancer ([place]) 1991    s/p hysterectomy  . Chronic cellulitis   . Chronic combined systolic and diastolic congestive heart failure, NYHA class 2 (HCC)    LVEF 25-30% with restrictive diastolic filling  . Degenerative joint disease   . Diverticulitis   . Essential hypertension   . GERD (gastroesophageal reflux disease)   . Gout   . Kidney stones   . Morbid obesity ([place])   . NICM (nonischemic cardiomyopathy) ([place]) 02/22/2017  . Osteoarthritis   . Permanent atrial fibrillation 05/04/2009   Qualifier: History of  By: Quentin Cornwall CMA, Janett Billow    . 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 Sinoatrial node dysfunction [facility])    Status post PPM - Dr. Lovena Le  . Sleep apnea    CPAP   Social History   Socioeconomic History  . Marital status: Widowed    Spouse name: Not on file  . Number of children: 1  . Years of education: Not on file  . Highest education level: Some college, no degree  Occupational History  . Not on file  Social Needs  . Financial resource strain: Not hard at all  . Food insecurity:    Worry: Never true    Inability: Never true  . Transportation needs:    Medical: No    Non-medical: No  Tobacco Use  . Smoking status: Former Smoker    Packs/day: 1.00    Types: Cigarettes    Last attempt to quit: 09/26/1979    Years since quitting: 39.3  . Smokeless tobacco: Never Used  Substance and Sexual Activity  . Alcohol use: No  . Drug use: No  . Sexual activity: Not Currently  Lifestyle  . Physical activity:    Days per week: Not on file    Minutes per session: Not on file  . Stress: Not on file  Relationships  . Social connections:    Talks on phone: Not on file    Gets together: Not on file    Attends religious service: Not on file    Active member of club or organization: Not on file    Attends meetings of clubs or organizations: Not on file    Relationship status: Not on file  Other Topics Concern  . Not on file   Social History Narrative  . Not on file   Family History  Problem Relation Age of Onset  . Heart attack Father   . Stroke Mother   . Diabetes Sister   . Colon cancer Other        seven family members  . Liver disease Neg Hx   . Other Neg Hx    Scheduled Meds: . allopurinol  300 mg Oral QPM  . [START ON 01/23/2019] aspirin EC  325 mg Oral QODAY  . [START ON 01/23/2019] calcitRIOL  0.25 mcg Oral QODAY  . feeding supplement  1 Container Oral BID  .  ferrous sulfate  325 mg Oral QHS  . gabapentin  300 mg Oral Once  . heparin  5,000 Units Subcutaneous Q8H  . midodrine  5 mg Oral TID WC  . pantoprazole  40 mg Oral BID  . sodium chloride flush  3 mL Intravenous Q12H   Continuous Infusions: . sodium chloride    . furosemide (LASIX) infusion     PRN Meds:.sodium chloride, acetaminophen **OR** acetaminophen, calcium carbonate (dosed in mg elemental calcium), camphor-menthol **AND** hydrOXYzine, docusate sodium, feeding supplement (NEPRO CARB STEADY), fentaNYL (SUBLIMAZE) injection, HYDROcodone-acetaminophen, HYDROcodone-acetaminophen, ondansetron **OR** ondansetron (ZOFRAN) IV, polyethylene glycol, sodium chloride flush, sorbitol, zolpidem Medications Prior to Admission:  Prior to Admission medications   Medication Sig Start Date End Date Taking? Authorizing Provider  allopurinol (ZYLOPRIM) 300 MG tablet Take 300 mg by mouth every evening.    Yes [provider]  aspirin EC 325 MG tablet Take 325 mg by mouth every other day.    Yes [provider]  calcitRIOL (ROCALTROL) 0.25 MCG capsule Take 0.25 mcg by mouth every other day.    Yes [provider]  docusate sodium (COLACE) 100 MG capsule Take 100 mg by mouth daily as needed for mild constipation.    Yes [provider]  feeding supplement (BOOST HIGH PROTEIN) LIQD Take 1 Container by mouth 2 (two) times daily.    Yes [provider]  ferrous sulfate 325 (65 FE) MG tablet Take 325 mg by mouth at  bedtime.    Yes [provider]  HYDROcodone-acetaminophen (NORCO) 10-325 MG tablet Take 1 tablet by mouth See admin instructions. Take 1 tablet by mouth two times a day and an additional 0.5 tablet two times a day as needed for pain 11/15/18  Yes Gherghe, Vella Redhead, MD  metolazone (ZAROXOLYN) 2.5 MG tablet Take 1 tablet (2.5 mg total) by mouth See admin instructions. Take 1 tablet by mouth every Monday, Wednesday, Friday. 01/05/19  Yes Aline August, MD  midodrine (PROAMATINE) 5 MG tablet Take 1 tablet (5 mg total) by mouth 3 (three) times daily with meals. 01/05/19  Yes Aline August, MD  Misc. Devices MISC at bedtime. C-PAP    Yes [provider]  pantoprazole (PROTONIX) 40 MG tablet Take 1 tablet (40 mg total) by mouth 2 (two) times daily. 10/02/18  Yes Mahala Menghini, PA-C  polyethylene glycol (MIRALAX / GLYCOLAX) 17 g packet Take 17 g by mouth daily as needed. Patient taking differently: Take 17 g by mouth daily as needed for mild constipation.  01/05/19  Yes Aline August, MD  torsemide (DEMADEX) 20 MG tablet Take 4 tablets (80 mg total) by mouth 2 (two) times daily. 01/20/19  Yes Georgiana Shore, NP  vitamin B-12 (CYANOCOBALAMIN) 500 MCG tablet Take 500 mcg by mouth daily.   Yes [provider]  potassium chloride SA (K-DUR,KLOR-CON) 20 MEQ tablet Take 1 tablet (20 mEq total) by mouth daily. Taking 57mq twice daily = 486m on Monday, Wednesday, Friday. 01/05/19   AlAline AugustMD   Allergies  Allergen Reactions  . Beta Adrenergic Blockers Anaphylaxis and Other (See Comments)    "DEATH" (Bradycardia and her organs shut down)  . Cephalexin Shortness Of Breath  . Codeine Anaphylaxis and Swelling    Throat swelling  . Contrast Media [Iodinated Diagnostic Agents] Other (See Comments)    Patient states "I have chronic kidney disease, so the doctor said no dye in my veins."  . Coreg [Carvedilol] Other (See Comments)    Beta Blockers  cause her organs to shut down (per  patient)  . Peppermint Flavor Shortness Of Breath  . Prednisone Anaphylaxis, Shortness Of Breath and Swelling    Throat swells   . Tape Other (See Comments)    States plastic tape blisters her skin  . Ciprofloxacin Hives    Tolerating levofloxin   . Latex Itching and Rash  . Penicillins Hives    DID THE REACTION INVOLVE: Swelling of the face/tongue/throat, SOB, or low BP? Yes Sudden or severe rash/hives, skin peeling, or the inside of the mouth or nose? Yes Did it require medical treatment? Pt was in hospital at the time of reaction When did it last happen?Was in her mid-40's If all above answers are "NO", may proceed with cephalosporin use.  . Iron Other (See Comments)    Per Patient- Felt like skin was crawling, foaming at mouth, and felt like she was going "crazy"  . Diamox [Acetazolamide] Rash    Rash after 2 doses of diamox    Review of Systems  Constitutional: Positive for fatigue.  Neurological: Positive for weakness.    Physical Exam Cardiovascular:     Rate and Rhythm: Normal rate and regular rhythm.  Skin:    General: Skin is warm and dry.  Neurological:     Mental Status: She is alert.     Vital Signs: BP (!) 97/49 (BP Location: Left Arm)   Pulse 96   Temp 97.7 F (36.5 C) (Oral)   Resp (!) 22   Ht 5' 6"  (1.676 m)   Wt 113.4 kg   SpO2 97%   BMI 40.35 kg/m  Pain Scale: 0-10   Pain Score: 10-Worst pain ever   SpO2: SpO2: 97 % O2 Device:SpO2: 97 % O2 Flow Rate: .   IO: Intake/output summary:   Intake/Output Summary (Last 24 hours) at 01/22/2019 1055 Last data filed at 01/22/2019 2094 Gross per 24 hour  Intake -  Output 100 ml  Net -100 ml    LBM: Last BM Date: 01/21/19 Baseline Weight: Weight: 113.4 kg Most recent weight: Weight: 113.4 kg     Palliative Assessment/Data:   30 % at best   Discussed with Dr Lorin Mercy  PMT will f/u in the morning   Time In: 1215 Time Out: 1330 Time Total: 75 minutes Greater than 50%  of this time was spent  counseling and coordinating care related to the above assessment and plan.  Signed by: [name], NP   Please contact Palliative Medicine Team phone at [phone] for questions and concerns.  For individual provider: See Shea Evans

## 2019-01-22 NOTE — Progress Notes (Signed)
Patient continues to have pain. Give PRN considered abour blood pressure MD aware with chronic hypotension. Requested a pain patch, or topical rub. Plan to drain pulerex cath today and every other day waiting on ordered materials

## 2019-01-22 NOTE — ED Provider Notes (Signed)
Patient seen/examined in the Emergency Department in conjunction with Advanced Practice Provider [place] Patient reports chest pain and shortness of breath Exam : awake/alert, appears uncomfortable, tachypnea noted Plan: pt found to have pulmonary edema and will need admission     Ripley Fraise, MD 01/22/19 [phone]

## 2019-01-22 NOTE — TOC Initial Note (Signed)
Transition of Care [facility]) - Initial/Assessment Note    Patient Details  Name: [name] [name] MRN: [id number] Date of Birth: August 01, 1946  Transition of Care [facility]) CM/SW Contact:    Sherrilyn Rist Phone Number: [phone] 01/22/2019, 10:01 AM  Clinical Narrative:                 01/22/2019 - CM following for progression of care; pt is active with [facility] services as prior to admission; B Pennie Rushing  01/01/2019 - Clinical Narrative:                 Presents with symptomatic anemia, hx of hypertension, chronic combined systolic and diastolic CHF, chronic kidney disease stage IV, and severe iron deficiency anemia, R Pleurx catheter / drain catheter every 48 hours.From home with daughter. Multi admits. PTA active with [facility]  (RN), [phone] (office), [phone])  Home DME: W/C, hospital bed, pleurx bottles. Marcellus Scott RN CM  Expected Discharge Plan: Home w Home Health Services Barriers to Discharge: No Barriers Identified   Patient Goals and CMS Choice Patient states their goals for this hospitalization and ongoing recovery are:: to stay at home CMS Medicare.gov Compare Post Acute Care list provided to:: Patient    Expected Discharge Plan and Services Expected Discharge Plan: [place] In-house Referral: NA Discharge Planning Services: CM Consult Post Acute Care Choice: Home Health, Resumption of Svcs/PTA Provider Living arrangements for the past 2 months: Single Family Home                 DME Arranged: N/A DME Agency: NA       HH Arranged: RN [place] Agency: [place]        Prior Living Arrangements/Services Living arrangements for the past 2 months: Single Family Home Lives with:: Adult Children Patient language and need for interpreter reviewed:: Yes        Need for Family Participation in Patient Care: Yes (Comment) Care giver support system in place?: Yes (comment) Current  home services: Home RN Criminal Activity/Legal Involvement Pertinent to Current Situation/Hospitalization: No - Comment as needed  Activities of Daily Living Home Assistive Devices/Equipment: Environmental consultant (specify type), Wheelchair, BIPAP ADL Screening (condition at time of admission) Patient's cognitive ability adequate to safely complete daily activities?: Yes Is the patient deaf or have difficulty hearing?: Yes Does the patient have difficulty seeing, even when wearing glasses/contacts?: No Does the patient have difficulty concentrating, remembering, or making decisions?: No Patient able to express need for assistance with ADLs?: Yes Does the patient have difficulty dressing or bathing?: No Independently performs ADLs?: Yes (appropriate for developmental age) Does the patient have difficulty walking or climbing stairs?: Yes Weakness of Legs: Both Weakness of Arms/Hands: None  Permission Sought/Granted                  Emotional Assessment         Alcohol / Substance Use: Not Applicable Psych Involvement: No (comment)  Admission diagnosis:  Shortness of breath [R06.02] Acute pulmonary edema ([place]) [J81.0] Patient Active Problem List   Diagnosis Date Noted  . Hypotension, chronic 01/22/2019  . Anemia due to chronic kidney disease 01/22/2019  . Umbilical hernia 35/32/9924  . DNR (do not resuscitate)   . Weakness generalized   . Iron deficiency anemia due to chronic blood loss   . Acute blood loss anemia   . Upper GI bleeding 12/07/2018  . Chronic blood loss anemia 12/05/2018  . Heme  positive stool   . Severe anemia 11/06/2018  . Goals of care, counseling/discussion   . Palliative care by specialist   . DNR (do not resuscitate) discussion   . Hypertensive cardiovascular-renal disease, stage 1-4 or unspecified chronic kidney disease, with heart failure ([place]) 10/06/2018  . Hypokalemia 10/06/2018  . Hyponatremia 10/06/2018  . Thrombocytopenia ([place]) 10/06/2018  . Acute on  chronic combined systolic and diastolic congestive heart failure, NYHA class 4 ([place]) 10/06/2018  . Gastrointestinal hemorrhage 10/03/2018  . Gastritis and gastroduodenitis   . Symptomatic anemia   . Acute renal failure superimposed on stage 4 chronic kidney disease ([place])   . Syncope 06/21/2018  . Iron deficiency 06/21/2018  . GERD (gastroesophageal reflux disease) 10/04/2017  . NICM (nonischemic cardiomyopathy) ([place]) 02/22/2017  . CKD stage G3b/A1, GFR 30-44 and albumin creatinine ratio <30 mg/g (HCC)   . Dysphagia, idiopathic 02/15/2017  . Pleural effusion on right 01/30/2017  . Osteoporosis 08/10/2016  . Liver cirrhosis secondary to NASH ([place]) 06/13/2016  . Chest pain 10/30/2014  . CKD (chronic kidney disease), stage V ([place]) 03/07/2013  . Sinoatrial node dysfunction (HCC)   . 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 OSA (obstructive sleep apnea) 05/05/2009  . Essential hypertension 05/04/2009  . Permanent atrial fibrillation 05/04/2009  . ALLERGIC RHINITIS 05/04/2009   PCP:  [name], MD Pharmacy:   [facility], [place] [address] Phone: [phone] Fax: [phone]     Social Determinants of Health (SDOH) Interventions    Readmission Risk Interventions Readmission Risk Prevention Plan 12/10/2018  Transportation Screening Complete  Medication Review (Speculator) Patient Refused  PCP or Specialist appointment within 3-5 days of discharge Complete  HRI or [place] Not Complete  HRI or Home Care Consult Pt Refusal Comments Need to find home health agency that can manage pleurex drain.  SW Recovery Care/Counseling Consult Complete  Palliative Care Screening Not Applicable  Skilled Nursing Facility Not Applicable  Some recent data might be hidden

## 2019-01-22 NOTE — ED Notes (Signed)
MD Yates at bedside to assess pt

## 2019-01-22 NOTE — Progress Notes (Signed)
patient arrived to unit in pain with has on Cpap.

## 2019-01-22 NOTE — Consult Note (Signed)
[name] [name] Admit Date: 01/22/2019 01/22/2019 Rexene Agent Requesting Physician:  Lorin Mercy MD  Reason for Consult:  [id number] HPI:  73 year old female with complex medical history presented with 24 hours of left-sided chest pain.  PMH Incudes:  Mixed systolic and diastolic CHF; severe MR, TR, pulmonary hypertension; follows with advanced heart failure  Morbid obesity  Atrial fibrillation status post AV node ablation with permanent pacemaker  OSA on BiPAP  GERD  Gout  Patient has CKD 4/5 followed by Dr. Warrick Parisian in our office, multifactorial etiology with cardiac component predominant.  We have followed her closely during her numerous admissions with acute on chronic renal failure corresponding to decompensated heart failure and anemia.  Given the severity of her comorbidities palliative care has been assisting.  Patient was last seen by nephrology on 4/12 when she was clinically stable and discharged to continue diuretics and consider transition to palliative measures.  Patient presented with acute onset left-sided chest pain.  Negative troponin x2.  She has a right-sided pleural drain.  COVID testing negative.  Prior to admission her diuretics have been held.  She has been seen here by AHF and diuretics continue to be held, volume status fairly stable.  Presenting serum creatinine of 4.7, her baseline is around 3.  BUN of 143, usually BUN is around 100.  Potassium 3.7.  Serum sodium 125.  Last renal ultrasound was 09/24/2018 when she had small kidneys with increased echogenicity consistent with medical renal disease without obstruction.  Creat (mg/dL)  Date Value  04/13/2014 1.36 (H)  06/30/2013 1.43 (H)  06/16/2013 1.39 (H)  05/30/2013 1.69 (H)   Creatinine, Ser (mg/dL)  Date Value  01/22/2019 4.74 (H)  01/05/2019 3.10 (H)  01/04/2019 2.87 (H)  01/03/2019 2.93 (H)  01/02/2019 3.13 (H)  01/01/2019 3.34 (H)  01/01/2019 3.39 (H)  12/31/2018 3.57 (H)  12/11/2018 2.61  (H)  12/10/2018 2.61 (H)  ] ROS NSAIDS: Denies use IV Contrast no exposure TMP/SMX no identified exposure Hypotension present chronically Balance of 12 systems is negative w/ exceptions as above  PMH  Past Medical History:  Diagnosis Date  . Anemia   . Atrial fibrillation ([place])   . Cataract   . Cervical cancer ([place]) 1991   s/p hysterectomy  . Chronic cellulitis   . Chronic combined systolic and diastolic congestive heart failure, NYHA class 2 ([facility])    LVEF 25-30% with restrictive diastolic filling  . Degenerative joint disease   . Diverticulitis   . Essential hypertension   . GERD (gastroesophageal reflux disease)   . Gout   . Kidney stones   . Morbid obesity ([place])   . NICM (nonischemic cardiomyopathy) ([place]) 02/22/2017  . Osteoarthritis   . Permanent atrial fibrillation 05/04/2009   Qualifier: History of  By: Quentin Cornwall CMA, Janett Billow    . 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   Qualifier: Diagnosis of  By: Lovena Le, MD, [facility], Binnie Kand   . Sinoatrial node dysfunction [facility])    Status post PPM - Dr. Lovena Le  . Sleep apnea    CPAP   PSH  Past Surgical History:  Procedure Laterality Date  . ABDOMINAL HYSTERECTOMY  1991  . BIOPSY  09/26/2018   Procedure: BIOPSY;  Surgeon: Lavena Bullion, DO;  Location: [place];  Service: Gastroenterology;;  . CHEST TUBE INSERTION Right 11/11/2018   Procedure: INSERTION PLEURAL DRAINAGE CATHETER;  Surgeon: Ivin Poot, MD;  Location: [place];  Service: Thoracic;  Laterality: Right;  . COLONOSCOPY  1998  one polyp per patient  . ESOPHAGOGASTRODUODENOSCOPY (EGD) WITH PROPOFOL N/A 09/26/2018   Procedure: ESOPHAGOGASTRODUODENOSCOPY (EGD) WITH PROPOFOL;  Surgeon: Lavena Bullion, DO;  Location: [place];  Service: Gastroenterology;  Laterality: N/A;  . EYE SURGERY    . GIVENS CAPSULE STUDY N/A 10/09/2018   Procedure: GIVENS CAPSULE STUDY;  Surgeon: Carol Ada, MD;  Location: [place];  Service: Endoscopy;  Laterality:  N/A;  . IR THORACENTESIS ASP PLEURAL SPACE W/IMG GUIDE  10/09/2018  . PACEMAKER INSERTION  Nov 2000   St Jude with revision in 2011  . PERCUTANEOUS NEPHROLITHOTOMY  April 2012  . RIGHT HEART CATH N/A 06/28/2018   Procedure: RIGHT HEART CATH;  Surgeon: Jolaine Artist, MD;  Location: [place] [facility];  Service: Cardiovascular;  Laterality: N/A;  . TEE WITHOUT CARDIOVERSION N/A 06/28/2018   Procedure: TRANSESOPHAGEAL ECHOCARDIOGRAM (TEE);  Surgeon: Jolaine Artist, MD;  Location[facility] ENDOSCOPY;  Service: Cardiovascular;  Laterality: N/A;   FH  Family History  Problem Relation Age of Onset  . Heart attack Father   . Stroke Mother   . Diabetes Sister   . Colon cancer Other        seven family members  . Liver disease Neg Hx   . Other Neg Hx    SH  reports that she quit smoking about 39 years ago. Her smoking use included cigarettes. She smoked 1.00 pack per day. She has never used smokeless tobacco. She reports that she does not drink alcohol or use drugs. Allergies  Allergies  Allergen Reactions  . Beta Adrenergic Blockers Anaphylaxis and Other (See Comments)    "DEATH" (Bradycardia and her organs shut down)  . Cephalexin Shortness Of Breath  . Codeine Anaphylaxis and Swelling    Throat swelling  . Contrast Media [Iodinated Diagnostic Agents] Other (See Comments)    Patient states "I have chronic kidney disease, so the doctor said no dye in my veins."  . Coreg [Carvedilol] Other (See Comments)    Beta Blockers cause her organs to shut down (per patient)  . Peppermint Flavor Shortness Of Breath  . Prednisone Anaphylaxis, Shortness Of Breath and Swelling    Throat swells   . Tape Other (See Comments)    States plastic tape blisters her skin  . Ciprofloxacin Hives    Tolerating levofloxin   . Latex Itching and Rash  . Penicillins Hives    DID THE REACTION INVOLVE: Swelling of the face/tongue/throat, SOB, or low BP? Yes Sudden or severe rash/hives, skin peeling, or the  inside of the mouth or nose? Yes Did it require medical treatment? Pt was in hospital at the time of reaction When did it last happen?Was in her mid-40's If all above answers are "NO", may proceed with cephalosporin use.  . Iron Other (See Comments)    Per Patient- Felt like skin was crawling, foaming at mouth, and felt like she was going "crazy"  . Diamox [Acetazolamide] Rash    Rash after 2 doses of diamox    Home medications Prior to Admission medications   Medication Sig Start Date End Date Taking? Authorizing Provider  allopurinol (ZYLOPRIM) 300 MG tablet Take 300 mg by mouth every evening.     [provider]  aspirin EC 325 MG tablet Take 325 mg by mouth every other day.     [provider]  calcitRIOL (ROCALTROL) 0.25 MCG capsule Take 0.25 mcg by mouth every other day.     [provider]  COD LIVER OIL  PO Take 1 capsule by mouth daily.     [provider]  docusate sodium (COLACE) 100 MG capsule Take 100 mg by mouth daily as needed for mild constipation.     [provider]  feeding supplement (BOOST HIGH PROTEIN) LIQD Take 1 Container by mouth 2 (two) times daily.     [provider]  ferrous sulfate 325 (65 FE) MG tablet Take 325 mg by mouth at bedtime.     [provider]  HYDROcodone-acetaminophen (NORCO) 10-325 MG tablet Take 1 tablet by mouth See admin instructions. Take 1 tablet by mouth two times a day and an additional 0.5 tablet two times a day as needed for pain 11/15/18   Caren Griffins, MD  metolazone (ZAROXOLYN) 2.5 MG tablet Take 1 tablet (2.5 mg total) by mouth See admin instructions. Take 1 tablet by mouth every Monday, Wednesday, Friday. 01/05/19   Aline August, MD  midodrine (PROAMATINE) 5 MG tablet Take 1 tablet (5 mg total) by mouth 3 (three) times daily with meals. 01/05/19   Aline August, MD  Misc. Devices MISC 1 each by Does not apply route at bedtime. C-PAP    [provider]   pantoprazole (PROTONIX) 40 MG tablet Take 1 tablet (40 mg total) by mouth 2 (two) times daily. Patient not taking: Reported on 12/31/2018 10/02/18   Mahala Menghini, PA-C  polyethylene glycol (MIRALAX / GLYCOLAX) 17 g packet Take 17 g by mouth daily as needed. 01/05/19   Aline August, MD  potassium chloride SA (K-DUR,KLOR-CON) 20 MEQ tablet Take 1 tablet (20 mEq total) by mouth daily. Taking 33mq twice daily = 48m on Monday, Wednesday, Friday. Patient taking differently: Take 20 mEq by mouth 2 (two) times daily. Taking 2065mtwice daily = 33m6mn Monday, Wednesday, Friday. 01/05/19   AlekAline August  torsemide (DEMADEX) 20 MG tablet Take 4 tablets (80 mg total) by mouth 2 (two) times daily. 01/20/19   SmitGeorgiana Shore  vitamin B-12 (CYANOCOBALAMIN) 500 MCG tablet Take 500 mcg by mouth daily.    [provider]    Current Medications Scheduled Meds: Continuous Infusions: PRN Meds:.  CBC Recent Labs  Lab 01/22/19 0409  WBC 10.6*  HGB 7.7*  HCT 25.2*  MCV 86.9  PLT 303 330asic Metabolic Panel Recent Labs  Lab 01/22/19 0409  NA 125*  K 3.7  CL 83*  CO2 24  GLUCOSE 177*  BUN 143*  CREATININE 4.74*  CALCIUM 8.8*    Physical Exam  Blood pressure 105/60, pulse 92, temperature 97.8 F (36.6 C), temperature source Oral, resp. rate (!) 23, height 5' 6"  (1.676 m), weight 113.4 kg, SpO2 99 %. GEN: Chronically ill-appearing, obese, no acute distress ENT: NCAT EYES: EOMI CV: Regular, normal S1 and S2, systolic murmur present PULM: Diminished in the bases ABD: Soft, nontender SKIN: Chronic venous stasis changes of hyperpigmentation in the lower extremities EXT: Only trace to 1+ edema at the current time   Assessment 72 y70r old female with severe valvular disease and combined CHF with CKD 4/5 and recurrent AKI presenting with acute onset of chest pain and acute on chronic renal failure.  Chest pain being evaluated by cardiology primary service.  She does not  currently appear volume overloaded.  Diuretics are being held.  Specific etiology of renal failure is unclear, might be part of her historical lability.  Patient has met with palliative care before and also discussed dialysis with nephrology.  I think that she is  a poor candidate given her severe right-sided heart failure.  Broached that I am not sure she would do well if dialysis was employed.  She was surprised to hear this.  At this time does not appear to have overt uremic symptoms and we will follow along closely, hopefully things will improve/stabilize.  1. AoCKD-5; likely hemodynamic related; holding diuretics; doubt obstruction; not uremic; poor candidate for long-term hemodialysis 2. Acute onset chest pain, per primary service and cardiology 3. Chronic combined CHF with severe MR and TR, pulmonary hypertension, per AHF 4. OSA on BiPAP 5. Chronic right pleural catheter, to be drained today 6. History of recurrent upper GI bleeding, etiology unclear  Plan 1. Continue to involve palliative care. 2. We will continue to discuss that dialysis is unlikely to provide benefit she would hope for 3. Agree with holding diuretics 4. Will follow along closely 5. Daily weights, Daily Renal Panel, Strict I/Os, Avoid nephrotoxins (NSAIDs, judicious IV Contrast)    Pearson Grippe MD 01/22/2019, 7:56 AM

## 2019-01-22 NOTE — ED Triage Notes (Signed)
Pt POV for chest pain.

## 2019-01-22 NOTE — H&P (Signed)
History and Physical    [name] [name] ERX:[id number] DOB: [birth year] DOA: 01/22/2019  PCP: [name], MD Consultants:  [facility] - cardiology; [facility] - GI; [facility] Gum - CT surgery  Patient coming from:  Home - lives with daughter; Donald Prose: Daughter, [phone]  Chief Complaint: CP, SOB  HPI: [name] [name] is a 73 y.o. female with medical history significant of hypertension, atrial fibrillation not on anticoagulants, GI bleeding, CHF with EF 30-35%, sinoatrial node dysfunction, PPM placement, OSA, CKD-4, GERD, gout, iron deficiency anemia, who presents with chest pain and shortness of breath.  "My heart hurts."  It has been hurting all night.  The pain is under her left breast and it feels like "pain, pain, it hurts.  Oh God, it hurts more than anything I've ever had before."  Nothing makes it better.  They gave her a shot and hydrocodone and nothing has touched it.  Nothing makes it worse.  +SOB.  No cough.     ED Course:  Carryover, per Dr. Blaine Hamper:   73 year old lady with a past medical history of hypertension, atrial fibrillation not on anticoagulants, GI bleeding, CHF with EF 30-35%, sinoatrial node dysfunction, PPM placement, OSA, CKD-4, GERD, gout, iron deficiency anemia, who presents with chest pain and shortness of breath. Has oxygen desaturation to 88% on room air.  Patient was found to have negative COVID-19 test, positive troponin 0.03, BNP 135, WBC 10.6, worsening renal function with creatinine increased from recent 3.1-4.74, BUN 143, sodium 125, temperature normal, tachycardia, soft blood pressure. Chest x-ray showed cardiomegaly and interstitial edema, and the bilateral airspace disease. Due to worsening renal function and hyponatremia, did not restart IV diuretics. Pt is admitted to SDU as inpt. I requested card consult via Epic, hoping Dr. Clayborne Dana team to see patient.  -card consult is requested via Epic, hoping Dr. Letha Cape team to see patient.   Review of  Systems: As per HPI; otherwise review of systems reviewed and negative.   Ambulatory Status:  In a wheelchair, non-ambulatory  Past Medical History:  Diagnosis Date  . Anemia   . Atrial fibrillation ([place])   . Cataract   . Cervical cancer ([place]) 1991   s/p hysterectomy  . Chronic cellulitis   . Chronic combined systolic and diastolic congestive heart failure, NYHA class 2 (HCC)    LVEF 25-30% with restrictive diastolic filling  . Degenerative joint disease   . Diverticulitis   . Essential hypertension   . GERD (gastroesophageal reflux disease)   . Gout   . Kidney stones   . Morbid obesity ([place])   . NICM (nonischemic cardiomyopathy) ([place]) 02/22/2017  . Osteoarthritis   . Permanent atrial fibrillation 05/04/2009   Qualifier: History of  By: Quentin Cornwall CMA, Janett Billow    . 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 Sinoatrial node dysfunction [facility])    Status post PPM - Dr. Lovena Le  . Sleep apnea    CPAP    Past Surgical History:  Procedure Laterality Date  . ABDOMINAL HYSTERECTOMY  1991  . BIOPSY  09/26/2018   Procedure: BIOPSY;  Surgeon: Lavena Bullion, DO;  Location: [place];  Service: Gastroenterology;;  . CHEST TUBE INSERTION Right 11/11/2018   Procedure: INSERTION PLEURAL DRAINAGE CATHETER;  Surgeon: Ivin Poot, MD;  Location: [place];  Service: Thoracic;  Laterality: Right;  . COLONOSCOPY  1998   one polyp per patient  . ESOPHAGOGASTRODUODENOSCOPY (EGD) WITH PROPOFOL N/A  09/26/2018   Procedure: ESOPHAGOGASTRODUODENOSCOPY (EGD) WITH PROPOFOL;  Surgeon: Lavena Bullion, DO;  Location: [place];  Service: Gastroenterology;  Laterality: N/A;  . EYE SURGERY    . GIVENS CAPSULE STUDY N/A 10/09/2018   Procedure: GIVENS CAPSULE STUDY;  Surgeon: Carol Ada, MD;  Location: [place];  Service: Endoscopy;  Laterality: N/A;  . IR THORACENTESIS ASP PLEURAL SPACE W/IMG GUIDE  10/09/2018  . PACEMAKER  INSERTION  Nov 2000   St Jude with revision in 2011  . PERCUTANEOUS NEPHROLITHOTOMY  April 2012  . RIGHT HEART CATH N/A 06/28/2018   Procedure: RIGHT HEART CATH;  Surgeon: Jolaine Artist, MD;  Location: [place] CV LAB;  Service: Cardiovascular;  Laterality: N/A;  . TEE WITHOUT CARDIOVERSION N/A 06/28/2018   Procedure: TRANSESOPHAGEAL ECHOCARDIOGRAM (TEE);  Surgeon: Jolaine Artist, MD;  Location: [facility] ENDOSCOPY;  Service: Cardiovascular;  Laterality: N/A;    Social History   Socioeconomic History  . Marital status: Widowed    Spouse name: Not on file  . Number of children: 1  . Years of education: Not on file  . Highest education level: Some college, no degree  Occupational History  . Not on file  Social Needs  . Financial resource strain: Not hard at all  . Food insecurity:    Worry: Never true    Inability: Never true  . Transportation needs:    Medical: No    Non-medical: No  Tobacco Use  . Smoking status: Former Smoker    Packs/day: 1.00    Types: Cigarettes    Last attempt to quit: 09/26/1979    Years since quitting: 39.3  . Smokeless tobacco: Never Used  Substance and Sexual Activity  . Alcohol use: No  . Drug use: No  . Sexual activity: Not Currently  Lifestyle  . Physical activity:    Days per week: Not on file    Minutes per session: Not on file  . Stress: Not on file  Relationships  . Social connections:    Talks on phone: Not on file    Gets together: Not on file    Attends religious service: Not on file    Active member of club or organization: Not on file    Attends meetings of clubs or organizations: Not on file    Relationship status: Not on file  . Intimate partner violence:    Fear of current or ex partner: Not on file    Emotionally abused: Not on file    Physically abused: Not on file    Forced sexual activity: Not on file  Other Topics Concern  . Not on file  Social History Narrative  . Not on file    Allergies  Allergen  Reactions  . Beta Adrenergic Blockers Anaphylaxis and Other (See Comments)    "DEATH" (Bradycardia and her organs shut down)  . Cephalexin Shortness Of Breath  . Codeine Anaphylaxis and Swelling    Throat swelling  . Contrast Media [Iodinated Diagnostic Agents] Other (See Comments)    Patient states "I have chronic kidney disease, so the doctor said no dye in my veins."  . Coreg [Carvedilol] Other (See Comments)    Beta Blockers cause her organs to shut down (per patient)  . Peppermint Flavor Shortness Of Breath  . Prednisone Anaphylaxis, Shortness Of Breath and Swelling    Throat swells   . Tape Other (See Comments)    States plastic tape blisters her skin  . Ciprofloxacin Hives  Tolerating levofloxin   . Latex Itching and Rash  . Penicillins Hives    DID THE REACTION INVOLVE: Swelling of the face/tongue/throat, SOB, or low BP? Yes Sudden or severe rash/hives, skin peeling, or the inside of the mouth or nose? Yes Did it require medical treatment? Pt was in hospital at the time of reaction When did it last happen?Was in her mid-40's If all above answers are "NO", may proceed with cephalosporin use.  . Iron Other (See Comments)    Per Patient- Felt like skin was crawling, foaming at mouth, and felt like she was going "crazy"  . Diamox [Acetazolamide] Rash    Rash after 2 doses of diamox     Family History  Problem Relation Age of Onset  . Heart attack Father   . Stroke Mother   . Diabetes Sister   . Colon cancer Other        seven family members  . Liver disease Neg Hx   . Other Neg Hx     Prior to Admission medications   Medication Sig Start Date End Date Taking? Authorizing Provider  allopurinol (ZYLOPRIM) 300 MG tablet Take 300 mg by mouth every evening.     [provider]  aspirin EC 325 MG tablet Take 325 mg by mouth every other day.     [provider]  calcitRIOL (ROCALTROL) 0.25 MCG capsule Take 0.25 mcg by mouth every other day.     [provider]  COD LIVER OIL PO Take 1 capsule by mouth daily.     [provider]  docusate sodium (COLACE) 100 MG capsule Take 100 mg by mouth daily as needed for mild constipation.     [provider]  feeding supplement (BOOST HIGH PROTEIN) LIQD Take 1 Container by mouth 2 (two) times daily.     [provider]  ferrous sulfate 325 (65 FE) MG tablet Take 325 mg by mouth at bedtime.     [provider]  HYDROcodone-acetaminophen (NORCO) 10-325 MG tablet Take 1 tablet by mouth See admin instructions. Take 1 tablet by mouth two times a day and an additional 0.5 tablet two times a day as needed for pain 11/15/18   Caren Griffins, MD  metolazone (ZAROXOLYN) 2.5 MG tablet Take 1 tablet (2.5 mg total) by mouth See admin instructions. Take 1 tablet by mouth every Monday, Wednesday, Friday. 01/05/19   Aline August, MD  midodrine (PROAMATINE) 5 MG tablet Take 1 tablet (5 mg total) by mouth 3 (three) times daily with meals. 01/05/19   Aline August, MD  Misc. Devices MISC 1 each by Does not apply route at bedtime. C-PAP    [provider]  pantoprazole (PROTONIX) 40 MG tablet Take 1 tablet (40 mg total) by mouth 2 (two) times daily. Patient not taking: Reported on 12/31/2018 10/02/18   Mahala Menghini, PA-C  polyethylene glycol (MIRALAX / GLYCOLAX) 17 g packet Take 17 g by mouth daily as needed. 01/05/19   Aline August, MD  potassium chloride SA (K-DUR,KLOR-CON) 20 MEQ tablet Take 1 tablet (20 mEq total) by mouth daily. Taking 72mq twice daily = 441m on Monday, Wednesday, Friday. Patient taking differently: Take 20 mEq by mouth 2 (two) times daily. Taking 2036mtwice daily = 27m93mn Monday, Wednesday, Friday. 01/05/19   AlekAline August  torsemide (DEMADEX) 20 MG tablet Take 4 tablets (80 mg total) by mouth 2 (two) times daily. 01/20/19   SmitGeorgiana Shore  vitamin B-12 (  CYANOCOBALAMIN) 500 MCG tablet Take 500 mcg by mouth daily.    [provider]     Physical Exam: Vitals:   01/22/19 0715 01/22/19 0730 01/22/19 0741 01/22/19 0745  BP:   94/83 105/60  Pulse: 90 95  92  Resp: (!) 21 (!) 21  (!) 23  Temp:      TempSrc:      SpO2: 100% 100% 99% 99%  Weight:      Height:         . General: Screaming out in pain, begging for relief . Eyes:   EOMI, normal lids, iris . ENT:  grossly normal hearing, lips & tongue, mmm . Neck:  no LAD, masses or thyromegaly . Cardiovascular:  RRR, no m/r/g. No LE edema.  Marland Kitchen Respiratory:   CTA bilaterally with no wheezes/rales/rhonchi.  Mildly increased respiratory effort, likely associated with pain. . Abdomen:  soft, NT, ND, NABS; she has a large umbilical hernia that is firm but patient states is chronic and unchanged . Skin:  no rash or induration seen on limited exam - specifically no apparent skin changes under the left breast that would be indicative of shingles in the area she is experiencing the pain although there is a bit of patchy erythema in adjacent areas (her LUQ and on the breast itself); she has chronic skin changes associated with stasis with healing ulcerations . Musculoskeletal:  grossly normal tone BUE/BLE, good ROM, no bony abnormality . Psychiatric: distraught mood and affect but to pain, speech fluent and appropriate, AOx3 . Neurologic:  CN 2-12 grossly intact, moves all extremities in coordinated fashion, sensation intact    Radiological Exams on Admission: Dg Chest Portable 1 View  Result Date: 01/22/2019 CLINICAL DATA:  Chest pain. EXAM: PORTABLE CHEST 1 VIEW COMPARISON:  One-view chest x-ray 12/31/2018 FINDINGS: The heart is enlarged. Interstitial edema is present. Bibasilar opacities likely represent a combination of effusions and airspace disease. Pacing wire is stable. IMPRESSION: 1. Cardiomegaly and interstitial edema consistent with congestive heart failure. 2. Bibasilar airspace opacities. This likely represents combination of edema and atelectasis. Infection is not  excluded. Electronically Signed   By: San Morelle M.D.   On: 01/22/2019 04:51    EKG: Independently reviewed.  Ventricularly paced with rate 97; nonspecific ST changes with no evidence of acute ischemia; NSCSLT   Labs on Admission: I have personally reviewed the available labs and imaging studies at the time of the admission.  Pertinent labs:   Na++ 125; baseline 132 Glucose 177 BUN 143/Creatinine 4.74/GFR 9;  Baseline creatinine roughly 3, GFR 14 Anion gap 18 BNP 135.3; 228.9 on 2/11 Troponin 0.03 WBC 10.6 Hgb 7.7 - stable   Assessment/Plan Principal Problem:   Acute on chronic combined systolic and diastolic congestive heart failure, NYHA class 4 (HCC) Active Problems:   OSA (obstructive sleep apnea)   Permanent atrial fibrillation   CKD (chronic kidney disease), stage V (HCC)   Chest pain   Hypotension, chronic   Anemia due to chronic kidney disease   Umbilical hernia   Acute on chronic CHF with exacerbation -Patient with baseline very poor health condition with severe CHF and progressive CKD presenting with worsening SOB and severe left-sided chest pain (see below) -Normal WBC count, does not appear to be infectious at this time -Elevated BNP, but mildly so -CXR seems to clearly indicate CHF and volume overload -With elevated BNP and abnl CXR, CHF seems most probable as diagnosis -Will admit to SDU -1/13 echo with EF 30-35%  which was worse than in 10/19 (40-45%) -Will continue ASA -No ACE due to CDK -No beta blocker due to hypotension -CHF order set utilized -Cardiology/CHF team consulted -She was not given Lasix in the ER -Will start with continuous Lasix infusion rather than boluses, due to hypotension and an improved ability to titrate based on BP -Continue [place] O2 for now -Repeat EKG in AM -Will r/o with serial troponins although doubt ACS based on symptoms -If she is unable to tolerate diuresis with medication, dialysis may need to be considered;  however, she is considered to be a poor candidate for HD and so palliative care may need to be considered.  Palliative care consultation for goals of care has been placed.    Chest pain -Patient's symptoms are not typical for ischemic pain in that she is having excruciating continuous pain -Ruling out with serial troponins, as above, but her mild troponin elevation on presentation is better than her prior and is not expectedly abnormal given her renal dysfunction -Her pain may be pleuritic in nature given the volume overload, and diuresis should help with that -I specifically looked for shingles since neuropathic pain would be consistent with her complaint, but do not currently see any vesicles suggestive of this -Will give Neurontin 300 mg x 1 to see if this improves her pain -I have also ordered Fentanyl, although this may need to be held for hypotension -Will continue her home PO medication -She was tested and found to be negative for COVID-19 infection  Hypotension -This appears to be a chronic issue for her, likely associated with her cardiorenal failure -She was started on Midodrine per nephrology -I have requested nephrology assistance in managing this patient  Stage IV-V CKD with anemia -Appears to be progressive -She is thought to be a poor candidate for HD -If no HD and she does not respond to diuresis, she may need to consider hospice -Anemia appears to be stable at this time  OSA -Continue CPAP  Afib  -Rate controlled with PPM -No anticoagulation for a multitude of reasons including recent GI bleeding  Umbilical hernia -She reports that this is chronic and stable -Could consider imaging for further evaluation -However, at this time, her pain is under her left breast and so does not appear to be related  Recurrent pleural effusion -effusion is on the right -Pleurx catheter placed on 2/17 -Drainage every other day   DVT prophylaxis: Heparin  Code Status:  DNR -  confirmed with patient Family Communication: None present Disposition Plan:  Home once clinically improved Consults called: Cardiology; Nephrology; CM/PT  Admission status: Admit - It is my clinical opinion that admission to INPATIENT is reasonable and necessary because this patient will require at least 2 midnights in the hospital to treat this condition based on the medical complexity of the problems presented.  Given the aforementioned information, the predictability of an adverse outcome is felt to be significant.    Karmen Bongo MD [facility]   How to contact the [facility] Attending or Consulting provider [place] or covering provider during after hours [place], for this patient?  1. Check the care team in [facility] and look for a) attending/consulting [facility] provider listed and b) the [facility] team listed 2. Log into [email or web address] and use 's universal password to access. If you do not have the password, please contact the hospital operator. 3. Locate the [facility] provider you are looking for under [facility] and page to a number that you  can be directly reached. 4. If you still have difficulty reaching the provider, please page the [facility] (Director on Call) for the Hospitalists listed on amion for assistance.   01/22/2019, 8:22 AM

## 2019-01-22 NOTE — Consult Note (Addendum)
Advanced Heart Failure Team Consult Note   Primary Physician: [name], MD PCP-Cardiologist:  Dorris Carnes, MD  Reason for Consultation: Heart Failure   HPI:    [name] [name] is seen today for evaluation of heart failure at the request of Dr Lorin Mercy.   Ireoluwa Gorsline Sargentis a 73 y.o.femalewith a history of morbid obesity, afib s/p AV ablation andSt JudePPM, chronic combined HF, CKD IV, severe MR, TR, pulmonary HTN, OSA on BiPAP, GERD, gout, and iron deficiency anemia. Cardiac cath 2009 with normal coronary arteries.   Admitted 06/20/18 with CP and SOB.Echo was completed and showed EF 40-45% with severe MR/TR. Cause of cardiomyopathy uncertain. Could be valvular, could be due to chronic RV pacing . Underwent RHC and TEE (see below). Diuresed with IV lasix and transistioned to torsemide 80 mg twice a day. Discharge weight 244.6 pounds.   She saw Dr Burt Knack on 10/16 for possible mitral clip. She was not a candidate for mitral clip due to  multiple co-morbidities including RV failure with severe TR.  Admitted 10/2018, 11/2018, and 12/2018. symptomatic anemia and chest pain. During her most recent admission earlier this month she was referred to Palliative Care with recommendations for Hospice given ongoing MSOF.   Today she presented to [facility] with chest pain and increased shortness of breath. CXR was concerning for interstitial edema. Pertinent admission labs included: bnp 1235, creatinine 4.7, BUN 143, hgb 7.7, WBC 10.6, sodium 125, and troponin 0.03.   Echo  10/07/18: EF 30-35%, mod MR, RV normal function but mod dilated, mod TR  RHC (06/28/18):  RA = 19 with prominent v-waves RV = 53/19 PA = 58/26 (40) PCW = 27 v waves = 35-40 Fick cardiac output/index = 6.4/2.9 PVR = 2.0 WU Ao sat = 98% PA sat = 63%, 67%  TEE (06/28/18): EF 40-45%, dyssynchrony noted, moderate-severe MR, severe TR, severe biatrial enlargement =>restrictive cardiomyo Review of Systems: [y] = yes, [ ]  =  no    General: Weight gain [ ] ; Weight loss [ ] ; Anorexia [ ] ; Fatigue [Y ]; Fever [ ] ; Chills [ ] ; Weakness [ ]    Cardiac: Chest pain/pressure [ ] ; Resting SOB [ Y]; Exertional SOB [Y ]; Orthopnea [ Y]; Pedal Edema [Y; Palpitations [ ] ; Syncope [ ] ; Presyncope [ ] ; Paroxysmal nocturnal dyspnea[ ]    Pulmonary: Cough [ ] ; Wheezing[ ] ; Hemoptysis[ ] ; Sputum [ ] ; Snoring [ ]    GI: Vomiting[ ] ; Dysphagia[ ] ; Melena[ ] ; Hematochezia [ ] ; Heartburn[ ] ; Abdominal pain [ ] ; Constipation [ ] ; Diarrhea [ ] ; BRBPR [ ]    GU: Hematuria[ ] ; Dysuria [ ] ; Nocturia[ ]    Vascular: Pain in legs with walking [ ] ; Pain in feet with lying flat [ ] ; Non-healing sores [ ] ; Stroke [ ] ; TIA [ ] ; Slurred speech [ ] ;   Neuro: Headaches[ ] ; Vertigo[ ] ; Seizures[ ] ; Paresthesias[ ] ;Blurred vision [ ] ; Diplopia [ ] ; Vision changes [ ]    Ortho/Skin: Arthritis [ ] ; Joint pain [Y ]; Muscle pain [ ] ; Joint swelling [ ] ; Back Pain [Y ]; Rash [ ]    Psych: Depression[ Y]; Anxiety[Y ]   Heme: Bleeding problems [ ] ; Clotting disorders [ ] ; Anemia [ Y]   Endocrine: Diabetes [ ] ; Thyroid dysfunction[ ]   Home Medications Prior to Admission medications   Medication Sig Start Date End Date Taking? Authorizing Provider  allopurinol (ZYLOPRIM) 300 MG tablet Take 300 mg by mouth every evening.    Yes [provider]  aspirin  EC 325 MG tablet Take 325 mg by mouth every other day.    Yes [provider]  calcitRIOL (ROCALTROL) 0.25 MCG capsule Take 0.25 mcg by mouth every other day.    Yes [provider]  docusate sodium (COLACE) 100 MG capsule Take 100 mg by mouth daily as needed for mild constipation.    Yes [provider]  feeding supplement (BOOST HIGH PROTEIN) LIQD Take 1 Container by mouth 2 (two) times daily.    Yes [provider]  ferrous sulfate 325 (65 FE) MG tablet Take 325 mg by mouth at bedtime.    Yes [provider]  HYDROcodone-acetaminophen (NORCO) 10-325 MG  tablet Take 1 tablet by mouth See admin instructions. Take 1 tablet by mouth two times a day and an additional 0.5 tablet two times a day as needed for pain 11/15/18  Yes Gherghe, Vella Redhead, MD  metolazone (ZAROXOLYN) 2.5 MG tablet Take 1 tablet (2.5 mg total) by mouth See admin instructions. Take 1 tablet by mouth every Monday, Wednesday, Friday. 01/05/19  Yes Aline August, MD  midodrine (PROAMATINE) 5 MG tablet Take 1 tablet (5 mg total) by mouth 3 (three) times daily with meals. 01/05/19  Yes Aline August, MD  Misc. Devices MISC at bedtime. C-PAP    Yes [provider]  pantoprazole (PROTONIX) 40 MG tablet Take 1 tablet (40 mg total) by mouth 2 (two) times daily. 10/02/18  Yes Mahala Menghini, PA-C  polyethylene glycol (MIRALAX / GLYCOLAX) 17 g packet Take 17 g by mouth daily as needed. Patient taking differently: Take 17 g by mouth daily as needed for mild constipation.  01/05/19  Yes Aline August, MD  torsemide (DEMADEX) 20 MG tablet Take 4 tablets (80 mg total) by mouth 2 (two) times daily. 01/20/19  Yes Georgiana Shore, NP  vitamin B-12 (CYANOCOBALAMIN) 500 MCG tablet Take 500 mcg by mouth daily.   Yes [provider]  potassium chloride SA (K-DUR,KLOR-CON) 20 MEQ tablet Take 1 tablet (20 mEq total) by mouth daily. Taking 38mq twice daily = 429m on Monday, Wednesday, Friday. 01/05/19   AlAline AugustMD    Past Medical History: Past Medical History:  Diagnosis Date   Anemia    Atrial fibrillation ([facility]   Cataract    Cervical cancer ([facility]   s/p hysterectomy   Chronic cellulitis    Chronic combined systolic and diastolic congestive heart failure, NYHA class 2 (HCC)    LVEF 25-30% with restrictive diastolic filling   Degenerative joint disease    Diverticulitis    Essential hypertension    GERD (gastroesophageal reflux disease)    Gout    Kidney stones    Morbid obesity (HCC)    NICM (nonischemic cardiomyopathy) ([place]5/31/2018    Osteoarthritis    Permanent atrial fibrillation 05/04/2009   Qualifier: History of  By: RoQuentin CornwallMA, Jessica     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   Qualifier: Diagnosis of  By: TaLovena LeMD, FAMartyn Malay  [facility]   Status post PPM - Dr. TaLovena Le Sleep apnea    CPAP    Past Surgical History: Past Surgical History:  Procedure Laterality Date   ABDOMINAL HYSTERECTOMY  1991   BIOPSY  09/26/2018   Procedure: BIOPSY;  Surgeon: CiLavena BullionDO;  [facility];  Service: Gastroenterology;;   CHEST TUBE INSERTION Right 11/11/2018   Procedure: INSERTION PLEURAL DRAINAGE CATHETER;  Surgeon: VaPrescott Gum  Collier Salina, MD;  Location: [place];  Service: Thoracic;  Laterality: Right;   COLONOSCOPY  1998   one polyp per patient   ESOPHAGOGASTRODUODENOSCOPY (EGD) WITH PROPOFOL N/A 09/26/2018   Procedure: ESOPHAGOGASTRODUODENOSCOPY (EGD) WITH PROPOFOL;  Surgeon: Lavena Bullion, DO;  Location: [place];  Service: Gastroenterology;  Laterality: N/A;   EYE SURGERY     GIVENS CAPSULE STUDY N/A 10/09/2018   Procedure: GIVENS CAPSULE STUDY;  Surgeon: Carol Ada, MD;  Location: [place];  Service: Endoscopy;  Laterality: N/A;   IR THORACENTESIS ASP PLEURAL SPACE W/IMG GUIDE  10/09/2018   PACEMAKER INSERTION  Nov 2000   St Jude with revision in 2011   PERCUTANEOUS NEPHROLITHOTOMY  April 2012   RIGHT HEART CATH N/A 06/28/2018   Procedure: RIGHT HEART CATH;  Surgeon: Jolaine Artist, MD;  Location: [place] CV LAB;  Service: Cardiovascular;  Laterality: N/A;   TEE WITHOUT CARDIOVERSION N/A 06/28/2018   Procedure: TRANSESOPHAGEAL ECHOCARDIOGRAM (TEE);  Surgeon: Jolaine Artist, MD;  Location[facility];  Service: Cardiovascular;  Laterality: N/A;    Family History: Family History  Problem Relation Age of Onset   Heart attack Father    Stroke Mother    Diabetes Sister    Colon cancer Other        seven family  members   Liver disease Neg Hx    Other Neg Hx     Social History: Social History   Socioeconomic History   Marital status: Widowed    Spouse name: Not on file   Number of children: 1   Years of education: Not on file   Highest education level: Some college, no degree  Occupational History   Not on file  Social Needs   Financial resource strain: Not hard at all   Food insecurity:    Worry: Never true    Inability: Never true   Transportation needs:    Medical: No    Non-medical: No  Tobacco Use   Smoking status: Former Smoker    Packs/day: 1.00    Types: Cigarettes    Last attempt to quit: 09/26/1979    Years since quitting: 39.3   Smokeless tobacco: Never Used  Substance and Sexual Activity   Alcohol use: No   Drug use: No   Sexual activity: Not Currently  Lifestyle   Physical activity:    Days per week: Not on file    Minutes per session: Not on file   Stress: Not on file  Relationships   Social connections:    Talks on phone: Not on file    Gets together: Not on file    Attends religious service: Not on file    Active member of club or organization: Not on file    Attends meetings of clubs or organizations: Not on file    Relationship status: Not on file  Other Topics Concern   Not on file  Social History Narrative   Not on file    Allergies:  Allergies  Allergen Reactions   Beta Adrenergic Blockers Anaphylaxis and Other (See Comments)    "DEATH" (Bradycardia and her organs shut down)   Cephalexin Shortness Of Breath   Codeine Anaphylaxis and Swelling    Throat swelling   Contrast Media [Iodinated Diagnostic Agents] Other (See Comments)    Patient states "I have chronic kidney disease, so the doctor said no dye in my veins."   Coreg [Carvedilol] Other (See Comments)    Beta Blockers cause her organs to  shut down (per patient)   Peppermint Flavor Shortness Of Breath   Prednisone Anaphylaxis, Shortness Of Breath and Swelling     Throat swells    Tape Other (See Comments)    States plastic tape blisters her skin   Ciprofloxacin Hives    Tolerating levofloxin    Latex Itching and Rash   Penicillins Hives    DID THE REACTION INVOLVE: Swelling of the face/tongue/throat, SOB, or low BP? Yes Sudden or severe rash/hives, skin peeling, or the inside of the mouth or nose? Yes Did it require medical treatment? Pt was in hospital at the time of reaction When did it last happen?Was in her mid-40's If all above answers are "NO", may proceed with cephalosporin use.   Iron Other (See Comments)    Per Patient- Felt like skin was crawling, foaming at mouth, and felt like she was going "crazy"   Diamox [Acetazolamide] Rash    Rash after 2 doses of diamox     Objective:    Vital Signs:   Temp:  [97.7 F (36.5 C)-97.8 F (36.6 C)] 97.7 F (36.5 C) (04/29 0827) Pulse Rate:  [84-106] 96 (04/29 0827) Resp:  [14-30] 22 (04/29 0827) BP: (74-128)/(38-92) 97/49 (04/29 0842) SpO2:  [88 %-100 %] 97 % (04/29 0827) Weight:  [113.4 kg] 113.4 kg (04/29 0407)    Weight change: Filed Weights   01/22/19 0407  Weight: 113.4 kg    Intake/Output:  No intake or output data in the 24 hours ending 01/22/19 0933    Physical Exam    General:  Moderate distress HEENT: normal Chest wall: No tenderness or rash under left breast.  Neck: supple. JVP difficult, not markedly elevated. Carotids 2+ bilat; no bruits. No lymphadenopathy or thyromegaly appreciated. Cor: PMI nonpalpalbe. Regular rate & rhythm. No rubs, gallops or murmurs. Lungs: Decreased BS at bases.  Abdomen: soft, nontender, nondistended. No hepatosplenomegaly. No bruits or masses. Good bowel sounds. Extremities: no cyanosis, clubbing, rash, edema Neuro: alert & orientedx3, cranial nerves grossly intact. moves all 4 extremities w/o difficulty. Affect pleasant   Telemetry   Atrial fibrillation with RV pacing, personally reviewed.   EKG    Atrial  fibrillation with RV pacing (personally reviewed)  Labs   Basic Metabolic Panel: Recent Labs  Lab 01/22/19 0409  NA 125*  K 3.7  CL 83*  CO2 24  GLUCOSE 177*  BUN 143*  CREATININE 4.74*  CALCIUM 8.8*    Liver Function Tests: No results for input(s): AST, ALT, ALKPHOS, BILITOT, PROT, ALBUMIN in the last 168 hours. No results for input(s): LIPASE, AMYLASE in the last 168 hours. No results for input(s): AMMONIA in the last 168 hours.  CBC: Recent Labs  Lab 01/22/19 0409  WBC 10.6*  HGB 7.7*  HCT 25.2*  MCV 86.9  PLT 303    Cardiac Enzymes: Recent Labs  Lab 01/22/19 0409  TROPONINI 0.03*    BNP: BNP (last 3 results) Recent Labs    10/06/18 1309 11/05/18 2224 01/22/19 0418  BNP 214.8* 228.9* 135.3*    ProBNP (last 3 results) No results for input(s): PROBNP in the last 8760 hours.   CBG: No results for input(s): GLUCAP in the last 168 hours.  Coagulation Studies: No results for input(s): LABPROT, INR in the last 72 hours.   Imaging   Dg Chest Portable 1 View  Result Date: 01/22/2019 CLINICAL DATA:  Chest pain. EXAM: PORTABLE CHEST 1 VIEW COMPARISON:  One-view chest x-ray 12/31/2018 FINDINGS: The heart is enlarged. Interstitial  edema is present. Bibasilar opacities likely represent a combination of effusions and airspace disease. Pacing wire is stable. IMPRESSION: 1. Cardiomegaly and interstitial edema consistent with congestive heart failure. 2. Bibasilar airspace opacities. This likely represents combination of edema and atelectasis. Infection is not excluded. Electronically Signed   By: San Morelle M.D.   On: 01/22/2019 04:51      Medications:     Current Medications:  allopurinol  300 mg Oral QPM   aspirin EC  325 mg Oral QODAY   calcitRIOL  0.25 mcg Oral QODAY   feeding supplement  1 Container Oral BID   ferrous sulfate  325 mg Oral QHS   gabapentin  300 mg Oral Once   heparin  5,000 Units Subcutaneous Q8H    HYDROcodone-acetaminophen  1 tablet Oral See admin instructions   midodrine  5 mg Oral TID WC   pantoprazole  40 mg Oral BID   sodium chloride flush  3 mL Intravenous Q12H     Infusions:  sodium chloride     furosemide (LASIX) infusion         Patient Profile   Kadajah Kjos Sargentis a 73 y.o.femalewith a history of morbid obesity, afib s/p AV ablation andSt JudePPM, chronic combined HF, CKD IV, severe MR, TR, pulmonary HTN, OSA on BiPAP, GERD, gout, and iron deficiency anemia. Cardiac cath 2009 with normal coronary arteries.   Assessment/Plan  1. Chest Pain  Troponin 6.43.   2. A/C Systolic/Diastolic Heart Failure, causes of cardiomyopathy uncertain.  ECHO 09/2018 EF 30-35%, mod MR, RV normal function but mod dilated, mod TR Try to diurese.  3. AKI on CKD Stage IV Baseline creatinine ~2.5.. Todays creatinine is 4.7.  Followed by Dr Deterding in the community. Nephrology consulted.  4. Anemia EGD and capsule study unrevealing in 1/20.Recent GI bleed 12/2018 Hgb 7.7 today. On PPI 5.Atrial Fibrillation Chronic, s/p AV nodal ablation with St Jude PPM. She is not anticoagulated due to recurrent GI bleeding.  6. Hyponatremia  Sodium today is 125 7. Recurrent pleural effusion   Length of Stay: 0  Darrick Grinder, NP  01/22/2019, 9:33 AM  Advanced Heart Failure Team Pager [phone] (M-F; 7a - 4p)  Please contact [facility] for night-coverage after hours (4p -7a ) and weekends on [email or web address]  Patient seen with NP, agree with the above note.  She came back to the ER today due to excruciating pain under her left breast.  It is not tender to the touch, no rash.  It has been present x 24 hrs with TnI 0.03, doubt ischemic.  She also has markedly elevated BUN/creatinine, was told to stop her torsemide a couple of days ago at home so has missed 4 doses.   On exam, JVP is difficult but does not appear significantly elevated.  Decreased BS at bases.  Regular S1S2. Trace ankle edema.   No tenderness or rash under left breast.   1. Chest pain: Uncertain etiology.  ?Referred from pleural fluid accumulation on right.  Doubt ischemic given >24 hrs pain with TnI only slightly elevated. No evidence for Zoster or mechanical injury. Afebrile, COVID-19 test negative.  - Control pain with Fentanyl. - Would drain her PleurX catheter today, discussed with nurse.  - Cycle troponin.  2. Chronic systolic CHF: Suspected nonischemic cardiomyopathy, RV pacing may contribute. Most recent echo in 1/20 with EF 30-35%, mild AS, moderate MR, moderate TR, moderately dilated RV. 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 Complicated by CKD stage IV. BP better on midodrine,  generally SBP 100s. She has been off torsemide x 2 days due to worsening renal function.  Exam is difficult, but I do not think she is particularly volume overloaded and her BNP is lower than usual (135).    - Would hold diuretics for now, especially with markedly elevated BUN/creatinine.  - Continue midodrine 5 mg tid.   3. AKI on CKD stage IV: ollowed by Dr. Jimmy Footman as outpatient. BUN/creatinine up to 143/4.74. She has been off torsemide for 2 days. Dialysis would be very difficult for her given significant RV failure and baseline low BP. She does not seem uremic.  As above, she does not appear markedly volume overloaded.   - Continue midodrine.  - Hold diuretics for now.  - nephrology to see.    4. GI bleed: Recurrent upper GI bleeding of uncertain etiology. She is not on anticoagulation. EGD and capsule study unrevealing in 1/20.  Hgb 7.7 today which has been her recent baseline, no overt bleeding.  5. Atrial fibrillation: Chronic, s/p AV nodal ablation with St Jude PPM. She is not anticoagulated due to recurrent GI bleeding.  6. Right PleurX catheter: Will drain today, ?cause of referred chest pain.  7. Would involve palliative care.   Loralie Champagne 01/22/2019 11:17 AM

## 2019-01-22 NOTE — Progress Notes (Signed)
Patient still having uncontrollable pain.  [facility] paged and order given for Flexeril.  Patient states she has had some improvement but is still in pain. Offered patient tylenol, fentanyl, and alternative methods such as heat or ice-patient refused.  Will give twice daily norco when it is time.

## 2019-01-22 NOTE — ED Provider Notes (Signed)
[facility] EMERGENCY DEPARTMENT Provider Note   CSN: [id number] Arrival date & time: 01/22/19  0355    History   Chief Complaint Chief Complaint  Patient presents with  . Chest Pain    HPI [name] [name] is a 73 y.o. female.     The history is provided by the patient and medical records.  Chest Pain     73 year old female with history of anemia, A. Fib, history of CHF with estimated EF of 25 to 30%, degenerative joint disease, hypertension, GERD, chronic kidney disease, nonischemic cardiomyopathy, sleep apnea, presenting to the ED with chest pain.  States this began last evening-- left sided beneath left breast without radiation.  She feels SOB but this is chronic.  SOB worse lying flat, better sitting upright.  No diaphoresis, nausea, vomiting, dizziness, or syncope.  States she has had a cough but no fever.  No sick contacts or known COVID exposures.  Her cardiologist is Dr. Haroldine Laws.  She does not use home O2.  States she has been compliant with medications.  Based on chart review, patient with multiple recent admissions for various issues including worsening CKD, chronic GI bleed, etc.  During last admission it was recommended she pursue palliative/hospice care, however patient declined.    Past Medical History:  Diagnosis Date  . Anemia   . Atrial fibrillation ([place])   . Cataract   . Cervical cancer ([place]) 1991   s/p hysterectomy  . Chronic cellulitis   . Chronic combined systolic and diastolic congestive heart failure, NYHA class 2 (HCC)    LVEF 25-30% with restrictive diastolic filling  . Degenerative joint disease   . Diverticulitis   . Essential hypertension   . GERD (gastroesophageal reflux disease)   . Gout   . Kidney stones   . Morbid obesity ([place])   . NICM (nonischemic cardiomyopathy) ([place]) 02/22/2017  . Osteoarthritis   . Permanent atrial fibrillation 05/04/2009   Qualifier: History of  By: Quentin Cornwall CMA, Janett Billow    . 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 Sinoatrial node dysfunction [facility])    Status post PPM - Dr. Lovena Le  . Sleep apnea    CPAP    Patient Active Problem List   Diagnosis Date Noted  . DNR (do not resuscitate)   . Weakness generalized   . Iron deficiency anemia due to chronic blood loss   . Acute blood loss anemia   . Upper GI bleeding 12/07/2018  . Chronic blood loss anemia 12/05/2018  . Heme positive stool   . Acute on chronic systolic CHF (congestive heart failure) ([place])   . Severe anemia 11/06/2018  . Goals of care, counseling/discussion   . Palliative care by specialist   . DNR (do not resuscitate) discussion   . 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 Hypokalemia 10/06/2018  . Hyponatremia 10/06/2018  . Thrombocytopenia ([place]) 10/06/2018  . Acute on chronic combined systolic and diastolic congestive heart failure, NYHA class 4 ([place]) 10/06/2018  . Acute on chronic systolic and diastolic heart failure, NYHA class 4 ([place]) 10/06/2018  . Gastrointestinal hemorrhage 10/03/2018  . Gastritis and gastroduodenitis   . AKI (acute kidney injury) ([place])   . Symptomatic anemia   . Acute renal failure superimposed on stage 4 chronic kidney disease ([place])   . Syncope 06/21/2018  . Iron deficiency 06/21/2018  .  GERD (gastroesophageal reflux disease) 10/04/2017  . Acute on chronic combined systolic and diastolic CHF (congestive heart failure) ([place]) 04/02/2017  . NICM (nonischemic cardiomyopathy) ([place]) 02/22/2017  . CKD stage G3b/A1, GFR 30-44 and albumin creatinine ratio <30 mg/g (HCC)   . Dysphagia, idiopathic 02/15/2017  . Pleural effusion on right 01/30/2017  . Osteoporosis 08/10/2016  . Liver cirrhosis secondary to NASH ([place]) 06/13/2016  . Chest pain 10/30/2014  . CKD (chronic kidney disease), stage IV ([place]) 03/07/2013  . Sinoatrial node  dysfunction (HCC)   . Chronic combined systolic and diastolic CHF (congestive heart failure) ([place]) 05/07/2011  . 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 OSA (obstructive sleep apnea) 05/05/2009  . Essential hypertension 05/04/2009  . Permanent atrial fibrillation 05/04/2009  . ALLERGIC RHINITIS 05/04/2009    Past Surgical History:  Procedure Laterality Date  . ABDOMINAL HYSTERECTOMY  1991  . BIOPSY  09/26/2018   Procedure: BIOPSY;  Surgeon: Lavena Bullion, DO;  Location: [facility];  Service: Gastroenterology;;  . CHEST TUBE INSERTION Right 11/11/2018   Procedure: INSERTION PLEURAL DRAINAGE CATHETER;  Surgeon: Ivin Poot, MD;  Location: [facility];  Service: Thoracic;  Laterality: Right;  . COLONOSCOPY  1998   one polyp per patient  . ESOPHAGOGASTRODUODENOSCOPY (EGD) WITH PROPOFOL N/A 09/26/2018   Procedure: ESOPHAGOGASTRODUODENOSCOPY (EGD) WITH PROPOFOL;  Surgeon: Lavena Bullion, DO;  Location: [facility];  Service: Gastroenterology;  Laterality: N/A;  . EYE SURGERY    . GIVENS CAPSULE STUDY N/A 10/09/2018   Procedure: GIVENS CAPSULE STUDY;  Surgeon: Carol Ada, MD;  Location: [facility];  Service: Endoscopy;  Laterality: N/A;  . IR THORACENTESIS ASP PLEURAL SPACE W/IMG GUIDE  10/09/2018  . PACEMAKER INSERTION  Nov 2000   St Jude with revision in 2011  . PERCUTANEOUS NEPHROLITHOTOMY  April 2012  . RIGHT HEART CATH N/A 06/28/2018   Procedure: RIGHT HEART CATH;  Surgeon: Jolaine Artist, MD;  Location: [facility] CV LAB;  Service: Cardiovascular;  Laterality: N/A;  . TEE WITHOUT CARDIOVERSION N/A 06/28/2018   Procedure: TRANSESOPHAGEAL ECHOCARDIOGRAM (TEE);  Surgeon: Jolaine Artist, MD;  Location: [facility] ENDOSCOPY;  Service: Cardiovascular;  Laterality: N/A;     OB History   No obstetric history on file.      Home Medications    Prior to Admission medications   Medication Sig Start Date End Date Taking? Authorizing Provider  allopurinol (ZYLOPRIM) 300  MG tablet Take 300 mg by mouth every evening.     [provider]  aspirin EC 325 MG tablet Take 325 mg by mouth every other day.     [provider]  calcitRIOL (ROCALTROL) 0.25 MCG capsule Take 0.25 mcg by mouth every other day.     [provider]  COD LIVER OIL PO Take 1 capsule by mouth daily.     [provider]  docusate sodium (COLACE) 100 MG capsule Take 100 mg by mouth daily as needed for mild constipation.     [provider]  feeding supplement (BOOST HIGH PROTEIN) LIQD Take 1 Container by mouth 2 (two) times daily.     [provider]  ferrous sulfate 325 (65 FE) MG tablet Take 325 mg by mouth at bedtime.     [provider]  HYDROcodone-acetaminophen (NORCO) 10-325 MG tablet Take 1 tablet by mouth See admin instructions. Take 1 tablet by mouth two times a day and an additional 0.5 tablet two times a day as needed for pain 11/15/18   Cruzita Lederer,  Vella Redhead, MD  metolazone (ZAROXOLYN) 2.5 MG tablet Take 1 tablet (2.5 mg total) by mouth See admin instructions. Take 1 tablet by mouth every Monday, Wednesday, Friday. 01/05/19   Aline August, MD  midodrine (PROAMATINE) 5 MG tablet Take 1 tablet (5 mg total) by mouth 3 (three) times daily with meals. 01/05/19   Aline August, MD  Misc. Devices MISC 1 each by Does not apply route at bedtime. C-PAP    [provider]  pantoprazole (PROTONIX) 40 MG tablet Take 1 tablet (40 mg total) by mouth 2 (two) times daily. Patient not taking: Reported on 12/31/2018 10/02/18   Mahala Menghini, PA-C  polyethylene glycol (MIRALAX / GLYCOLAX) 17 g packet Take 17 g by mouth daily as needed. 01/05/19   Aline August, MD  potassium chloride SA (K-DUR,KLOR-CON) 20 MEQ tablet Take 1 tablet (20 mEq total) by mouth daily. Taking 19mq twice daily = 46m on Monday, Wednesday, Friday. Patient taking differently: Take 20 mEq by mouth 2 (two) times daily. Taking 2031mtwice daily = 37m39mn Monday, Wednesday,  Friday. 01/05/19   AlekAline August  torsemide (DEMADEX) 20 MG tablet Take 4 tablets (80 mg total) by mouth 2 (two) times daily. 01/20/19   Smit[place]iana Shore  vitamin B-12 (CYANOCOBALAMIN) 500 MCG tablet Take 500 mcg by mouth daily.    [provider]    Family History Family History  Problem Relation Age of Onset  . Heart attack Father   . Stroke Mother   . Diabetes Sister   . Colon cancer Other        seven family members  . Liver disease Neg Hx   . Other Neg Hx     Social History Social History   Tobacco Use  . Smoking status: Former Smoker    Packs/day: 1.00    Types: Cigarettes    Last attempt to quit: 09/26/1979    Years since quitting: 39.3  . Smokeless tobacco: Never Used  Substance Use Topics  . Alcohol use: No  . Drug use: No     Allergies   Beta adrenergic blockers; Cephalexin; Codeine; Contrast media [iodinated diagnostic agents]; Coreg [carvedilol]; Peppermint flavor; Prednisone; Tape; Ciprofloxacin; Latex; Penicillins; Iron; and Diamox [acetazolamide]   Review of Systems Review of Systems  Cardiovascular: Positive for chest pain.  All other systems reviewed and are negative.    Physical Exam Updated Vital Signs BP 110/74 (BP Location: Right Arm)   Pulse (!) 105   Temp 97.8 F (36.6 C) (Oral)   Resp (!) 29   Ht 5' 6"  (1.676 m)   Wt 113.4 kg   SpO2 92%   BMI 40.35 kg/m   Physical Exam Vitals signs and nursing note reviewed.  Constitutional:      Appearance: She is well-developed.  HENT:     Head: Normocephalic and atraumatic.  Eyes:     Conjunctiva/sclera: Conjunctivae normal.     Pupils: Pupils are equal, round, and reactive to light.  Neck:     Musculoskeletal: Normal range of motion.  Cardiovascular:     Rate and Rhythm: Normal rate and regular rhythm.     Heart sounds: Normal heart sounds.  Pulmonary:     Effort: Pulmonary effort is normal.     Breath sounds: Normal breath sounds.     Comments: Leaning forward and  sitting fully upright, breathing somewhat labored, decreased air movement Chest:     Comments: No rashes of chest/flank, no discoloration or signs of trauma  noted Abdominal:     General: Bowel sounds are normal.     Palpations: Abdomen is soft.  Musculoskeletal: Normal range of motion.  Skin:    General: Skin is warm and dry.  Neurological:     Mental Status: She is alert and oriented to person, place, and time.      ED Treatments / Results  Labs (all labs ordered are listed, but only abnormal results are displayed) Labs Reviewed  BASIC METABOLIC PANEL - Abnormal; Notable for the following components:      Result Value   Sodium 125 (*)    Chloride 83 (*)    Glucose, Bld 177 (*)    BUN 143 (*)    Creatinine, Ser 4.74 (*)    Calcium 8.8 (*)    GFR calc non Af Amer 9 (*)    GFR calc Af Amer 10 (*)    Anion gap 18 (*)    All other components within normal limits  CBC - Abnormal; Notable for the following components:   WBC 10.6 (*)    RBC 2.90 (*)    Hemoglobin 7.7 (*)    HCT 25.2 (*)    RDW 17.4 (*)    All other components within normal limits  TROPONIN I - Abnormal; Notable for the following components:   Troponin I 0.03 (*)    All other components within normal limits  SARS CORONAVIRUS 2 (HOSPITAL ORDER, [facility])  BRAIN NATRIURETIC PEPTIDE    EKG EKG Interpretation  Date/Time:  Wednesday January 22 2019 04:11:50 EDT Ventricular Rate:  97 PR Interval:    QRS Duration: 178 QT Interval:  438 QTC Calculation: 557 R Axis:   -85 Text Interpretation:  v-paced rhythm Ventricular premature complex Nonspecific IVCD with LAD Inferior infarct, acute (RCA) Anterior infarct, old Interpretation limited secondary to artifact No significant change since last tracing Confirmed by Ripley Fraise [phone]) on 01/22/2019 4:19:30 AM   Radiology Dg Chest Portable 1 View  Result Date: 01/22/2019 CLINICAL DATA:  Chest pain. EXAM: PORTABLE CHEST 1 VIEW  COMPARISON:  One-view chest x-ray 12/31/2018 FINDINGS: The heart is enlarged. Interstitial edema is present. Bibasilar opacities likely represent a combination of effusions and airspace disease. Pacing wire is stable. IMPRESSION: 1. Cardiomegaly and interstitial edema consistent with congestive heart failure. 2. Bibasilar airspace opacities. This likely represents combination of edema and atelectasis. Infection is not excluded. Electronically Signed   By: San Morelle M.D.   On: 01/22/2019 04:51    Procedures Procedures (including critical care time)  Medications Ordered in ED Medications  fentaNYL (SUBLIMAZE) injection 50 mcg (50 mcg Intravenous Given 01/22/19 0440)  HYDROcodone-acetaminophen (NORCO/VICODIN) 5-325 MG per tablet 1 tablet (1 tablet Oral Given 01/22/19 0517)     Initial Impression / Assessment and Plan / ED Course  I have reviewed the triage vital signs and the nursing notes.  Pertinent labs & imaging results that were available during my care of the patient were reviewed by me and considered in my medical decision making (see chart for details).  73 y.o. F here with chest pain since last evening.  States beneath left breast without radiation.  She has chronic SOB.  Here O2 sats are marginal at 92% initially but dropping into the 80's on RA.  She does not use home O2.  She is sitting upright in bed, states she cannot breath when laying back.  Concern for pulmonary edema.  She denies cough, fever, sick contacts.  Plan for  screening labs, CXR.  EKG without acute ischemic findings.  4:59 AM Patient CXR concerning for pulmonary edema which correlates clinically.  She is now on 2L nasal canula and tolerating well.  O2 sats improved to 98%.  She continues yelling in pain and requesting medications, however BP's are soft into the 91'Y systolic.  Remainder of vitals are stable.  Patient states this is baseline for her, however she will require diuresis so I do not want BP to drop  dangerously low with narcotics on board as well.  She was given dose of her home PO meds. Informed she will require admission, she is agreeable.  COVID screening has been sent however suspicion is low.  Labs as above-- CKD worsening here compared with prior.  Troponin is equivocal, likely demand due to her fluid overload.  Patient remains stable on 2L supplemental O2.  Will admit for ongoing care.  COVID screen is negative.  Discussed with Dr. Blaine Hamper-- he will admit for ongoing care.  Cardiology to be consulted by IP team to assist with treatment plan.  Final Clinical Impressions(s) / ED Diagnoses   Final diagnoses:  Acute pulmonary edema [facility])  Shortness of breath    ED Discharge Orders    None       Larene Pickett, PA-C 01/22/19 0631    Ripley Fraise, MD 01/22/19 [phone]

## 2019-01-23 DIAGNOSIS — J90 Pleural effusion, not elsewhere classified: Secondary | ICD-10-CM

## 2019-01-23 DIAGNOSIS — I208 Other forms of angina pectoris: Secondary | ICD-10-CM

## 2019-01-23 DIAGNOSIS — R079 Chest pain, unspecified: Secondary | ICD-10-CM

## 2019-01-23 DIAGNOSIS — I4821 Permanent atrial fibrillation: Secondary | ICD-10-CM

## 2019-01-23 DIAGNOSIS — G4733 Obstructive sleep apnea (adult) (pediatric): Secondary | ICD-10-CM

## 2019-01-23 DIAGNOSIS — J810 Acute pulmonary edema: Secondary | ICD-10-CM

## 2019-01-23 DIAGNOSIS — R0602 Shortness of breath: Secondary | ICD-10-CM

## 2019-01-23 DIAGNOSIS — K420 Umbilical hernia with obstruction, without gangrene: Secondary | ICD-10-CM

## 2019-01-23 LAB — CBC WITH DIFFERENTIAL/PLATELET
Abs Immature Granulocytes: 0.08 10*3/uL — ABNORMAL HIGH (ref 0.00–0.07)
Basophils Absolute: 0 10*3/uL (ref 0.0–0.1)
Basophils Relative: 0 %
Eosinophils Absolute: 0.3 10*3/uL (ref 0.0–0.5)
Eosinophils Relative: 3 %
HCT: 24.3 % — ABNORMAL LOW (ref 36.0–46.0)
Hemoglobin: 7.6 g/dL — ABNORMAL LOW (ref 12.0–15.0)
Immature Granulocytes: 1 %
Lymphocytes Relative: 4 %
Lymphs Abs: 0.4 10*3/uL — ABNORMAL LOW (ref 0.7–4.0)
MCH: 26.6 pg (ref 26.0–34.0)
MCHC: 31.3 g/dL (ref 30.0–36.0)
MCV: 85 fL (ref 80.0–100.0)
Monocytes Absolute: 0.7 10*3/uL (ref 0.1–1.0)
Monocytes Relative: 7 %
Neutro Abs: 9.1 10*3/uL — ABNORMAL HIGH (ref 1.7–7.7)
Neutrophils Relative %: 85 %
Platelets: 297 10*3/uL (ref 150–400)
RBC: 2.86 MIL/uL — ABNORMAL LOW (ref 3.87–5.11)
RDW: 17.2 % — ABNORMAL HIGH (ref 11.5–15.5)
WBC: 10.6 10*3/uL — ABNORMAL HIGH (ref 4.0–10.5)
nRBC: 0 % (ref 0.0–0.2)

## 2019-01-23 LAB — BASIC METABOLIC PANEL
Anion gap: 14 (ref 5–15)
BUN: 148 mg/dL — ABNORMAL HIGH (ref 8–23)
CO2: 27 mmol/L (ref 22–32)
Calcium: 8.6 mg/dL — ABNORMAL LOW (ref 8.9–10.3)
Chloride: 81 mmol/L — ABNORMAL LOW (ref 98–111)
Creatinine, Ser: 5.37 mg/dL — ABNORMAL HIGH (ref 0.44–1.00)
GFR calc Af Amer: 8 mL/min — ABNORMAL LOW (ref >60)
GFR calc non Af Amer: 7 mL/min — ABNORMAL LOW (ref >60)
Glucose, Bld: 159 mg/dL — ABNORMAL HIGH (ref 70–99)
Potassium: 4.2 mmol/L (ref 3.5–5.1)
Sodium: 122 mmol/L — ABNORMAL LOW (ref 135–145)

## 2019-01-23 MED ORDER — HYDROCODONE-ACETAMINOPHEN 10-325 MG PO TABS
1.0000 | ORAL_TABLET | Freq: Three times a day (TID) | ORAL | Status: DC | PRN
  Administered 2019-01-23 – 2019-01-24 (×2): 1 via ORAL
  Filled 2019-01-23 (×3): qty 1

## 2019-01-23 MED ORDER — FENTANYL CITRATE (PF) 100 MCG/2ML IJ SOLN
25.0000 ug | INTRAMUSCULAR | Status: AC | PRN
  Administered 2019-01-24 (×2): 25 ug via INTRAVENOUS
  Filled 2019-01-23 (×2): qty 2

## 2019-01-23 MED ORDER — METHOCARBAMOL 500 MG PO TABS
500.0000 mg | ORAL_TABLET | Freq: Three times a day (TID) | ORAL | Status: DC | PRN
  Administered 2019-01-23 – 2019-01-24 (×4): 500 mg via ORAL
  Filled 2019-01-23 (×5): qty 1

## 2019-01-23 MED ORDER — BOOST PLUS PO LIQD
237.0000 mL | Freq: Two times a day (BID) | ORAL | Status: DC
  Administered 2019-01-23 – 2019-01-25 (×2): 237 mL via ORAL
  Filled 2019-01-23 (×7): qty 237

## 2019-01-23 NOTE — Progress Notes (Addendum)
Patient ID: [name] [name], female   DOB: Jan 23, 1946, 73 y.o.   MRN: [id number]     Advanced Heart Failure Rounding Note  PCP-Cardiologist: Dorris Carnes, MD   Subjective:    PleurX drained only about 50 cc fluid yesterday but patient states that chest pain is improved (still there though).  Not subjectively dyspneic at rest but appears to be mildly tachypneic.    BUN/creatinine higher today at 148/5.37.    Objective:   Weight Range: 109.9 kg Body mass index is 39.11 kg/m.   Vital Signs:   Temp:  [97.5 F (36.4 C)-98.1 F (36.7 C)] 98.1 F (36.7 C) (04/30 0401) Pulse Rate:  [86-98] 86 (04/30 0401) Resp:  [20-24] 20 (04/30 0401) BP: (74-117)/(38-69) 94/49 (04/30 0401) SpO2:  [92 %-98 %] 92 % (04/30 0401) Weight:  [109.9 kg-112.1 kg] 109.9 kg (04/30 0640) Last BM Date: 01/21/19  Weight change: Filed Weights   01/22/19 0407 01/23/19 0054 01/23/19 0640  Weight: 113.4 kg 112.1 kg 109.9 kg    Intake/Output:   Intake/Output Summary (Last 24 hours) at 01/23/2019 0808 Last data filed at 01/23/2019 0330 Gross per 24 hour  Intake 1200 ml  Output 350 ml  Net 850 ml      Physical Exam    General:  Chronically ill-appearing HEENT: Normal Neck: Supple. JVP difficult, suspect 8-9 cm. Carotids 2+ bilat; no bruits. No lymphadenopathy or thyromegaly appreciated. Cor: PMI nondisplaced. Regular rate & rhythm. 1/6 SEM RUSB. Lungs: Clear Abdomen: Soft, nontender, nondistended. No hepatosplenomegaly. No bruits or masses. Good bowel sounds. Extremities: No cyanosis, clubbing, rash. Trace ankle edema.  Neuro: Alert & orientedx3, cranial nerves grossly intact. moves all 4 extremities w/o difficulty. Affect pleasant   Telemetry   Atrial fibrillation with RV pacing, personally reviewed.   Labs    CBC Recent Labs    01/22/19 0409 01/23/19 0407  WBC 10.6* 10.6*  NEUTROABS  --  9.1*  HGB 7.7* 7.6*  HCT 25.2* 24.3*  MCV 86.9 85.0  PLT 303 735   Basic Metabolic Panel Recent  Labs    01/22/19 0409 01/23/19 0407  NA 125* 122*  K 3.7 4.2  CL 83* 81*  CO2 24 27  GLUCOSE 177* 159*  BUN 143* 148*  CREATININE 4.74* 5.37*  CALCIUM 8.8* 8.6*   Liver Function Tests No results for input(s): AST, ALT, ALKPHOS, BILITOT, PROT, ALBUMIN in the last 72 hours. No results for input(s): LIPASE, AMYLASE in the last 72 hours. Cardiac Enzymes Recent Labs    01/22/19 0840 01/22/19 1425 01/22/19 2003  TROPONINI 0.03* 0.03* 0.03*    BNP: BNP (last 3 results) Recent Labs    10/06/18 1309 11/05/18 2224 01/22/19 0418  BNP 214.8* 228.9* 135.3*    ProBNP (last 3 results) No results for input(s): PROBNP in the last 8760 hours.   D-Dimer No results for input(s): DDIMER in the last 72 hours. Hemoglobin A1C No results for input(s): HGBA1C in the last 72 hours. Fasting Lipid Panel No results for input(s): CHOL, HDL, LDLCALC, TRIG, CHOLHDL, LDLDIRECT in the last 72 hours. Thyroid Function Tests No results for input(s): TSH, T4TOTAL, T3FREE, THYROIDAB in the last 72 hours.  Invalid input(s): FREET3  Other results:   Imaging     No results found.   Medications:     Scheduled Medications: . allopurinol  300 mg Oral QPM  . aspirin EC  325 mg Oral QODAY  . calcitRIOL  0.25 mcg Oral QODAY  . feeding supplement  1 Container Oral  BID  . feeding supplement  1 Container Oral BID  . ferrous sulfate  325 mg Oral QHS  . heparin  5,000 Units Subcutaneous Q8H  . lidocaine  1 patch Transdermal Q24H  . midodrine  5 mg Oral TID WC  . pantoprazole  40 mg Oral BID  . sodium chloride flush  3 mL Intravenous Q12H     Infusions: . sodium chloride       PRN Medications:  sodium chloride, acetaminophen **OR** acetaminophen, calcium carbonate (dosed in mg elemental calcium), camphor-menthol **AND** hydrOXYzine, docusate sodium, feeding supplement (NEPRO CARB STEADY), fentaNYL (SUBLIMAZE) injection, HYDROcodone-acetaminophen, HYDROcodone-acetaminophen,  methocarbamol, ondansetron **OR** ondansetron (ZOFRAN) IV, polyethylene glycol, sodium chloride flush, sorbitol, zolpidem  Assessment/Plan   1. Chest pain: Uncertain etiology.  ?Referred from pleural fluid accumulation on right => however, not much fluid was drained from PleurX on 4/29.  Doubt ischemic given >24 hrs pain with TnI only slightly elevated, no trend. No evidence for Zoster or mechanical injury. Afebrile, COVID-19 test negative. Pain has improved.  - Control pain with Fentanyl. 2. Chronic systolic CHF: Suspected nonischemic cardiomyopathy, RV pacing may contribute. Most recent echo in 1/20 with EF 30-35%, mild AS, moderate MR, moderate TR, moderately dilated RV. 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 Complicated by AKI on CKD stage IV. BP better on midodrine, generally SBP 100s.She had been off torsemide x 2 days prior to admission due to worsening renal function.  She appears mildly tachypneic but weight is not up today.  BUN/creatinine are worse today.  Exam difficult, not markedly volume overloaded but suspect she will need attempt at diuresis soon.  - Will discuss with renal, but may be reasonable to hold diuretics one more day as long as she does not get significantly short of breath.  - Continue midodrine 5 mg tid.  3. AKI on CKD stage IV: followed by Dr. Jimmy Footman as outpatient. BUN/creatinine up to 148/5.37. She had been off torsemide for 2 days prior to admission. Dialysis would be a poor option for her given significant RV failure and baseline low BP. She does not seem uremic.  As above, not markedly volume overloaded but suspect starting to build.  - Continue midodrine.  - Will discuss with nephrology, will need some diuresis soon but could probably wait until tomorrow.   4. GI bleed: Recurrent upper GI bleeding of uncertain etiology. She is not on anticoagulation. EGD and capsule study unrevealing in 1/20.  Hgb 7.6 today which has been her recent  baseline, no overt bleeding.  5. Atrial fibrillation: Chronic, s/p AV nodal ablation with St Jude PPM. She is not anticoagulated due to recurrent GI bleeding.  6. Right PleurX catheter: Drained 4/29.  7. Palliative care has seen, she is not interested in hospice care at this point but is thinking about it.   Length of Stay: 1  Loralie Champagne, MD  01/23/2019, 8:08 AM  Advanced Heart Failure Team Pager [phone] (M-F; 7a - 4p)  Please contact [facility] for night-coverage after hours (4p -7a ) and weekends on [email or web address]

## 2019-01-23 NOTE — Progress Notes (Signed)
Admit: 01/22/2019 LOS: 1  41F severe combined CHf and valvular heart disease, progressive renal failure, numerous sig comorbidities  Subjective:  Marland Kitchen Reviewed notes from primary service, heart failure, palliative medicine. . Significant worsening in renal function overnight, potassium 4.2, sodium 122, BUN 148, creatinine 5.37. Marland Kitchen Diuretics are on hold. . Minimal urine output . Chest pain improved . At least explored current status with patient.  Discussed that hemodialysis unlikely to provide significant restoration to her health at the expense of suffering/discomfort.  Recommended consideration of hospice at this point and patient was receptive and willing to discuss with palliative care, acknowledging the near futility of hemodialysis.  04/29 0701 - 04/30 0700 In: 1200 [P.O.:1200] Out: 350 [Urine:350]  Filed Weights   01/22/19 0407 01/23/19 0054 01/23/19 0640  Weight: 113.4 kg 112.1 kg 109.9 kg    Scheduled Meds: . allopurinol  300 mg Oral QPM  . aspirin EC  325 mg Oral QODAY  . calcitRIOL  0.25 mcg Oral QODAY  . feeding supplement  1 Container Oral BID  . ferrous sulfate  325 mg Oral QHS  . heparin  5,000 Units Subcutaneous Q8H  . lactose free nutrition  237 mL Oral BID  . lidocaine  1 patch Transdermal Q24H  . midodrine  5 mg Oral TID WC  . pantoprazole  40 mg Oral BID  . sodium chloride flush  3 mL Intravenous Q12H   Continuous Infusions: . sodium chloride     PRN Meds:.sodium chloride, acetaminophen **OR** acetaminophen, calcium carbonate (dosed in mg elemental calcium), camphor-menthol **AND** hydrOXYzine, docusate sodium, feeding supplement (NEPRO CARB STEADY), fentaNYL (SUBLIMAZE) injection, HYDROcodone-acetaminophen, HYDROcodone-acetaminophen, methocarbamol, ondansetron **OR** ondansetron (ZOFRAN) IV, polyethylene glycol, sodium chloride flush, sorbitol, zolpidem  Current Labs: reviewed    Physical Exam:  Blood pressure (!) 116/59, pulse 84, temperature (!) 97.5 F  (36.4 C), temperature source Axillary, resp. rate 20, height 5' 6"  (1.676 m), weight 109.9 kg, SpO2 92 %. GEN: Chronically ill-appearing, obese, no acute distress ENT: NCAT EYES: EOMI CV: Regular, normal S1 and S2, systolic murmur present PULM: Diminished in the bases ABD: Soft, nontender SKIN: Chronic venous stasis changes of hyperpigmentation in the lower extremities EXT: Only trace to 1+ edema at the current time  A 1. Progressive AoCKD-5; likely hemodynamic related; holding diuretics; doubt obstruction; not uremic; poor candidate for long-term hemodialysis 2. Acute onset chest pain, per primary service and cardiology; stable today 3. Chronic combined CHF with severe MR and TR, pulmonary hypertension, per AHF 4. OSA on BiPAP 5. Chronic right pleural catheter,  6. History of recurrent upper GI bleeding, etiology unclear  P . Patient agrees that hemodialysis is not a good idea and does not wish to pursue . Patient wants to discuss hospice with palliative care . Medication Issues; o Preferred narcotic agents for pain control are hydromorphone, fentanyl, and methadone. Morphine should not be used.  o Baclofen should be avoided o Avoid oral sodium phosphate and magnesium citrate based laxatives / bowel preps    Pearson Grippe MD 01/23/2019, 12:01 PM  Recent Labs  Lab 01/22/19 0409 01/23/19 0407  NA 125* 122*  K 3.7 4.2  CL 83* 81*  CO2 24 27  GLUCOSE 177* 159*  BUN 143* 148*  CREATININE 4.74* 5.37*  CALCIUM 8.8* 8.6*   Recent Labs  Lab 01/22/19 0409 01/23/19 0407  WBC 10.6* 10.6*  NEUTROABS  --  9.1*  HGB 7.7* 7.6*  HCT 25.2* 24.3*  MCV 86.9 85.0  PLT 303 297

## 2019-01-23 NOTE — Progress Notes (Signed)
Patient ID: [name] [name], female   DOB: 27-Jun-1946, 73 y.o.   MRN: [id number]  This NP visited patient at the bedside as a follow up to  yesterday's [place].  Conversation with patient at bedsidie, she tells me "I'm dying", "I'm going home with hospice" I detailed the steps of a comfort path.  She verbalizes understanding, she expresses she is "not ready to die but has to accept it".  I spoke to the patient's daughter [name] discussed the current medical situation.   Created space and opportunity for daughter to explore her thoughts and feelings regarding her mother's current medical situation.  She understands the seriousness, the limited treatment options, and the limited prognosis. She supports the plan of care to transition the patient home with hospice services.  We discussed the natural trajectory and expectations at end of life.  I answered her questions and concerns.  Emotional support offered.   She asked to speak to a physician directly.  I paged Dr. Joelyn Oms and he then  phoned and spoke with daughter.  Plan of Care: - Focus of care is COMFORT and dignity -DNR/DNI -No further life prolonging measures - transition home with hospice services -symptom management    -Fentanly 25 mcg IV every 1 hr prn   -Norco 10-325 every 8 hrs prn   -Lidoderm 5% patch Medication for discharge   -script for Roxanol 5 mg po/sl every 1 hr prn for dyspnea or pain/Hospice patient     Discussed with Dr Joelyn Oms and Dr Sherral Hammers and bedside RN and [facility] to set up home hospice  Total time spent on the unit was 60 minutes  Greater than 50% of the time was spent in counseling and coordination of care  [name] NP  Palliative Medicine Team Team Phone # [phone] Pager [phone]

## 2019-01-23 NOTE — Progress Notes (Signed)
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  Has oxygen desaturation to 88% on room air.   Patient was found to have negative COVID-19 test, positive troponin 0.03, BNP 135, WBC 10.6, worsening renal function with creatinine increased from recent 3.1-4.74, BUN 143, sodium 125, temperature normal, tachycardia, soft blood pressure.  Chest x-ray showed cardiomegaly and interstitial edema, and the bilateral airspace disease.  Due to worsening renal function and hyponatremia, did not restart IV diuretics. Pt is admitted to SDU as inpt. I requested card consult via Epic, hoping Dr. Clayborne Dana team to see patient.   -card consult is requested via Epic, hoping Dr. Letha Cape team to see patient.      Subjective: 4/30/A/O x0 patient has been currently sedated for comfort   Assessment & Plan:   Principal Problem:   Acute on chronic combined systolic and diastolic congestive heart failure, NYHA class 4 (HCC) Active Problems:   OSA (obstructive sleep apnea)   Permanent atrial fibrillation   CKD (chronic kidney disease), stage V (HCC)   Chest pain   Hypotension, chronic   Anemia due to chronic kidney disease   Umbilical hernia   Acute on chronic CHF with exacerbation -Patient with baseline very poor health condition with severe CHF and progressive CKD presenting with worsening SOB and severe left-sided chest pain (see below) -Normal WBC count, does not appear to be infectious at this time -Elevated BNP, but mildly so -CXR seems to clearly indicate CHF and volume overload -With elevated BNP and abnl CXR, CHF seems  most probable as diagnosis -Will admit to SDU -1/13 echo with EF 30-35% which was worse than in 10/19 (40-45%) -Will continue ASA -No ACE due to CDK -No beta blocker due to hypotension -CHF order set utilized -Cardiology/CHF team consulted -She was not given Lasix in the ER -Will start with continuous Lasix infusion rather than boluses, due to hypotension and an improved ability to titrate based on BP -Continue [place] O2 for now -Repeat EKG in AM -Will r/o with serial troponins although doubt ACS based on symptoms -If she is unable to tolerate diuresis with medication, dialysis may need to be considered; however, she is considered to be a poor candidate for HD and so palliative care may need to be considered.  Palliative care consultation for goals of care has been placed.   - Nephrology and palliative care have spoken with family and patient and have decided on hospice care.  Currently working on getting patient transported to [place] under [facility].   Chest pain -Patient's symptoms are not typical for ischemic pain in that she is having excruciating continuous pain -Ruling out with serial troponins, as above, but her mild troponin elevation on presentation is better than her prior and is not expectedly abnormal given her renal dysfunction -Her pain may be pleuritic in nature given the volume overload, and diuresis should help with that -I specifically looked for shingles since neuropathic pain would be consistent with her complaint, but do not currently see any vesicles suggestive of this -Will give Neurontin 300 mg x 1 to see if this improves her pain -I have also ordered Fentanyl, although  this may need to be held for hypotension -Will continue her home PO medication -She was tested and found to be negative for COVID-19 infection - See acute on chronic CHF   Hypotension -This appears to be a chronic issue for her, likely associated with her cardiorenal failure -She was  started on Midodrine per nephrology -I have requested nephrology assistance in managing this patient - See acute on chronic CHF   Stage IV-V CKD with anemia -Appears to be progressive -She is thought to be a poor candidate for HD -If no HD and she does not respond to diuresis, she may need to consider hospice -Anemia appears to be stable at this time - See acute on chronic CHF  Hyponatremia - See acute on chronic CHF    OSA -Continue CPAP - See acute on chronic CHF   Afib  -Rate controlled with PPM -No anticoagulation for a multitude of reasons including recent GI bleeding - See acute on chronic CHF   Umbilical hernia -She reports that this is chronic and stable -Could consider imaging for further evaluation -However, at this time, her pain is under her left breast and so does not appear to be related - See acute on chronic CHF   Recurrent pleural effusion -effusion is on the right -Pleurx catheter placed on 2/17 -Drainage every other day - See acute on chronic CHF   DVT prophylaxis: SCD Code Status: DNR Family Communication: Palliative care and nephrology spoke with patient and daughter and described in detail plan of care Disposition Plan: [facility] in [place]   Consultants:  Palliative care Nephrology Heart failure team  Procedures/Significant Events:     I have personally reviewed and interpreted all radiology studies and my findings are as above.  VENTILATOR SETTINGS:    Cultures   Antimicrobials:    Devices    LINES / TUBES:      Continuous Infusions:  sodium chloride       Objective: Vitals:   01/23/19 0002 01/23/19 0054 01/23/19 0401 01/23/19 0640  BP: 117/69  (!) 94/49   Pulse: 98  86   Resp: 20  20   Temp: (!) 97.5 F (36.4 C)  98.1 F (36.7 C)   TempSrc: Oral  Oral   SpO2: 94%  92%   Weight:  112.1 kg  109.9 kg  Height:        Intake/Output Summary (Last 24 hours) at 01/23/2019 0827 Last data filed  at 01/23/2019 0815 Gross per 24 hour  Intake 1200 ml  Output 450 ml  Net 750 ml   Filed Weights   01/22/19 0407 01/23/19 0054 01/23/19 0640  Weight: 113.4 kg 112.1 kg 109.9 kg    Examination:  General: Patient asleep secondary to medication for comfort no acute respiratory distress Eyes: negative scleral hemorrhage, negative anisocoria, negative icterus ENT: Negative Runny nose, negative gingival bleeding, Neck:  Negative scars, masses, torticollis, lymphadenopathy, JVD Lungs: Clear to auscultation bilaterally without wheezes or crackles Cardiovascular: Regular rate and rhythm positive 4/6 systolic murmur, negative rub normal S1 and S2 Abdomen: negative abdominal pain, nondistended, positive soft, bowel sounds, no rebound, no ascites, no appreciable mass Extremities: No significant cyanosis, clubbing, or edema bilateral lower extremities Skin: Negative rashes, lesions, ulcers Psychiatric: Unable to evaluate secondary to patient being medicated  Central nervous system: Unable to evaluate secondary to patient being medicated  .     Data Reviewed: Care during the described time interval was provided by me .  I have reviewed  this patient's available data, including medical history, events of note, physical examination, and all test results as part of my evaluation.   CBC: Recent Labs  Lab 01/22/19 0409 01/23/19 0407  WBC 10.6* 10.6*  NEUTROABS  --  9.1*  HGB 7.7* 7.6*  HCT 25.2* 24.3*  MCV 86.9 85.0  PLT 303 631   Basic Metabolic Panel: Recent Labs  Lab 01/22/19 0409 01/23/19 0407  NA 125* 122*  K 3.7 4.2  CL 83* 81*  CO2 24 27  GLUCOSE 177* 159*  BUN 143* 148*  CREATININE 4.74* 5.37*  CALCIUM 8.8* 8.6*   GFR: Estimated Creatinine Clearance: 11.7 mL/min (A) (by C-G formula based on SCr of 5.37 mg/dL (H)). Liver Function Tests: No results for input(s): AST, ALT, ALKPHOS, BILITOT, PROT, ALBUMIN in the last 168 hours. No results for input(s): LIPASE, AMYLASE in  the last 168 hours. No results for input(s): AMMONIA in the last 168 hours. Coagulation Profile: No results for input(s): INR, PROTIME in the last 168 hours. Cardiac Enzymes: Recent Labs  Lab 01/22/19 0409 01/22/19 0840 01/22/19 1425 01/22/19 2003  TROPONINI 0.03* 0.03* 0.03* 0.03*   BNP (last 3 results) No results for input(s): PROBNP in the last 8760 hours. HbA1C: No results for input(s): HGBA1C in the last 72 hours. CBG: No results for input(s): GLUCAP in the last 168 hours. Lipid Profile: No results for input(s): CHOL, HDL, LDLCALC, TRIG, CHOLHDL, LDLDIRECT in the last 72 hours. Thyroid Function Tests: No results for input(s): TSH, T4TOTAL, FREET4, T3FREE, THYROIDAB in the last 72 hours. Anemia Panel: No results for input(s): VITAMINB12, FOLATE, FERRITIN, TIBC, IRON, RETICCTPCT in the last 72 hours. Urine analysis:    Component Value Date/Time   COLORURINE STRAW (A) 12/31/2018 2015   APPEARANCEUR CLEAR 12/31/2018 2015   LABSPEC 1.009 12/31/2018 2015   PHURINE 6.0 12/31/2018 2015   GLUCOSEU NEGATIVE 12/31/2018 2015   HGBUR SMALL (A) 12/31/2018 2015   BILIRUBINUR NEGATIVE 12/31/2018 2015   KETONESUR NEGATIVE 12/31/2018 2015   PROTEINUR NEGATIVE 12/31/2018 2015   UROBILINOGEN 0.2 03/06/2013 2035   NITRITE NEGATIVE 12/31/2018 2015   LEUKOCYTESUR MODERATE (A) 12/31/2018 2015   Sepsis Labs: @LABRCNTIP (procalcitonin:4,lacticidven:4)  ) Recent Results (from the past 240 hour(s))  SARS Coronavirus 2 [facility] order, Performed in [facility])     Status: None   Collection Time: 01/22/19  5:01 AM  Result Value Ref Range Status   SARS Coronavirus 2 NEGATIVE NEGATIVE Final    Comment: (NOTE) If result is NEGATIVE SARS-CoV-2 target nucleic acids are NOT DETECTED. The SARS-CoV-2 RNA is generally detectable in upper and lower  respiratory specimens during the acute phase of infection. The lowest  concentration of SARS-CoV-2 viral copies this assay can detect is  250  copies / mL. A negative result does not preclude SARS-CoV-2 infection  and should not be used as the sole basis for treatment or other  patient management decisions.  A negative result may occur with  improper specimen collection / handling, submission of specimen other  than nasopharyngeal swab, presence of viral mutation(s) within the  areas targeted by this assay, and inadequate number of viral copies  (<250 copies / mL). A negative result must be combined with clinical  observations, patient history, and epidemiological information. If result is POSITIVE SARS-CoV-2 target nucleic acids are DETECTED. The SARS-CoV-2 RNA is generally detectable in upper and lower  respiratory specimens dur ing the acute phase of infection.  Positive  results are indicative of active infection with  SARS-CoV-2.  Clinical  correlation with patient history and other diagnostic information is  necessary to determine patient infection status.  Positive results do  not rule out bacterial infection or co-infection with other viruses. If result is PRESUMPTIVE POSTIVE SARS-CoV-2 nucleic acids MAY BE PRESENT.   A presumptive positive result was obtained on the submitted specimen  and confirmed on repeat testing.  While 2019 novel coronavirus  (SARS-CoV-2) nucleic acids may be present in the submitted sample  additional confirmatory testing may be necessary for epidemiological  and / or clinical management purposes  to differentiate between  SARS-CoV-2 and other Sarbecovirus currently known to infect humans.  If clinically indicated additional testing with an alternate test  methodology [phone]) is advised. The SARS-CoV-2 RNA is generally  detectable in upper and lower respiratory sp ecimens during the acute  phase of infection. The expected result is Negative. Fact Sheet for Patients:  StrictlyIdeas.no Fact Sheet for Healthcare  Providers: BankingDealers.co.za This test is not yet approved or cleared by the [place] FDA and has been authorized for detection and/or diagnosis of SARS-CoV-2 by FDA under an Emergency Use Authorization (EUA).  This EUA will remain in effect (meaning this test can be used) for the duration of the COVID-19 declaration under Section 564(b)(1) of the Act, 21 U.S.C. section 360bbb-3(b)(1), unless the authorization is terminated or revoked sooner. Performed at [facility], [place] [address] [place], [place] [address]          Radiology Studies: Dg Chest Portable 1 View  Result Date: 01/22/2019 CLINICAL DATA:  Chest pain. EXAM: PORTABLE CHEST 1 VIEW COMPARISON:  One-view chest x-ray 12/31/2018 FINDINGS: The heart is enlarged. Interstitial edema is present. Bibasilar opacities likely represent a combination of effusions and airspace disease. Pacing wire is stable. IMPRESSION: 1. Cardiomegaly and interstitial edema consistent with congestive heart failure. 2. Bibasilar airspace opacities. This likely represents combination of edema and atelectasis. Infection is not excluded. Electronically Signed   By: San Morelle M.D.   On: 01/22/2019 04:51        Scheduled Meds:  allopurinol  300 mg Oral QPM   aspirin EC  325 mg Oral QODAY   calcitRIOL  0.25 mcg Oral QODAY   feeding supplement  1 Container Oral BID   feeding supplement  1 Container Oral BID   ferrous sulfate  325 mg Oral QHS   heparin  5,000 Units Subcutaneous Q8H   lidocaine  1 patch Transdermal Q24H   midodrine  5 mg Oral TID WC   pantoprazole  40 mg Oral BID   sodium chloride flush  3 mL Intravenous Q12H   Continuous Infusions:  sodium chloride       LOS: 1 day   Time spent: 40 minutes     Serayah Yazdani, Geraldo Docker, MD [facility] Pager [phone]  If 7PM-7AM, please contact night-coverage [email or web address] Password [facility] 01/23/2019, 8:27 AM

## 2019-01-23 NOTE — Evaluation (Signed)
Physical Therapy Evaluation/ Discharge Patient Details Name: [name] [name] MRN: [id number] DOB: 1945/10/09 Today's Date: 01/23/2019   History of Present Illness  73 yo admitted with SOB and chest pain with CHF. PMHx: HTN, AFib, GIB, CHF, pacemaker, OSA, CKD, GERD, gout, anemia  Clinical Impression  Pt EOB on arrival and per her reports as well as looking back at past therapy notes pt has been non ambulatory for 5 years. She functions at a stand pivot, W/C level and has intermittent assist of family at home. This admission and previous admissions pt will demonstrate current ability to pivot but declines any assist with transfers, any suggestions for changes for safety and refused HEP or gait. PT currently active with HHPT per her report and will defer all further needs to HHPT. Pt would greatly benefit from increased mobility due to generalized weakness and deconditioning and should she be agreeable to increased acute intervention please reorder. Will sign off with pt aware and agreeable and recommend daily mobility with nursing. PT eOB beginning and end of session as she refused transfer to W/C or recliner.  HR 90    Follow Up Recommendations Home health PT;Supervision for mobility/OOB    Equipment Recommendations  None recommended by PT    Recommendations for Other Services       Precautions / Restrictions Precautions Precautions: Fall      Mobility  Bed Mobility               General bed mobility comments: Pt EOB on arrival and declined further bed mobility  Transfers Overall transfer level: Modified independent   Transfers: Sit to/from [employer] transfer comment: pt able to stand from bed and pivot to [facility] grossly 1 foot away from pt at a 180 degree angle per pt request. PT declined 90 degree angle, cues or guarding. PT utilized bil UE support on objects throughout transfer and was able to transfer  bed<>BSC.   Ambulation/Gait              General Gait Details: pt refused attempting even a few feet  Stairs            Wheelchair Mobility    Modified Rankin (Stroke Patients Only)       Balance Overall balance assessment: Needs assistance Sitting-balance support: No upper extremity supported;Feet supported Sitting balance-Leahy Scale: Good     Standing balance support: Single extremity supported;During functional activity Standing balance-Leahy Scale: Poor Standing balance comment: Reliant on UE support during stand pivot transfer                             Pertinent Vitals/Pain Faces Pain Scale: Hurts even more Pain Location: bil LE Pain Descriptors / Indicators: Aching;Discomfort;Constant Pain Intervention(s): Limited activity within patient's tolerance;Monitored during session;Repositioned    Home Living Family/patient expects to be discharged to:: Private residence Living Arrangements: Children Available Help at Discharge: Family;Available PRN/intermittently Type of Home: House Home Access: Level entry     Home Layout: Two level;Able to live on main level with bedroom/bathroom Home Equipment: Tub bench;Walker - 2 wheels;Bedside commode;Cane - single point;Electric scooter;Hospital bed;Wheelchair - manual      Prior Function Level of Independence: Needs assistance   Gait / Transfers Assistance Needed: Reports she uses RW to ambulate into the bathroom grossly 3'. Uses WC for all other mobility. Reports she is independent with transfers to [facility].  ADL's / Homemaking Assistance Needed: Reports she sometimes requires assist with ADL tasks.         Hand Dominance        Extremity/Trunk Assessment   Upper Extremity Assessment Upper Extremity Assessment: Generalized weakness    Lower Extremity Assessment Lower Extremity Assessment: Generalized weakness    Cervical / Trunk Assessment Cervical / Trunk Assessment: Kyphotic  Communication   Communication: No  difficulties  Cognition Arousal/Alertness: Awake/alert Behavior During Therapy: Flat affect Overall Cognitive Status: Within Functional Limits for tasks assessed                                        General Comments      Exercises     Assessment/Plan    PT Assessment Patent does not need any further PT services  PT Problem List Decreased strength;Decreased balance;Decreased cognition;Decreased knowledge of precautions;Decreased mobility;Decreased knowledge of use of DME;Cardiopulmonary status limiting activity;Decreased activity tolerance;Decreased coordination;Decreased safety awareness;Pain       PT Treatment Interventions      PT Goals (Current goals can be found in the Care Plan section)  Acute Rehab PT Goals PT Goal Formulation: All assessment and education complete, DC therapy    Frequency     Barriers to discharge        Co-evaluation               AM-PAC PT "6 Clicks" Mobility  Outcome Measure Help needed turning from your back to your side while in a flat bed without using bedrails?: A Little Help needed moving from lying on your back to sitting on the side of a flat bed without using bedrails?: A Little Help needed moving to and from a bed to a chair (including a wheelchair)?: A Little Help needed standing up from a chair using your arms (e.g., wheelchair or bedside chair)?: A Little Help needed to walk in hospital room?: A Lot Help needed climbing 3-5 steps with a railing? : Total 6 Click Score: 15    End of Session   Activity Tolerance: Patient tolerated treatment well Patient left: in bed;with call bell/phone within reach;with bed alarm set Nurse Communication: Mobility status PT Visit Diagnosis: Muscle weakness (generalized) (M62.81);Difficulty in walking, not elsewhere classified (R26.2);Other abnormalities of gait and mobility (R26.89)    Time: 4114-6431 PT Time Calculation (min) (ACUTE ONLY): 11 min   Charges:   PT  Evaluation $PT Eval Moderate Complexity: 1 Mod          [place], PT Acute Rehabilitation Services Pager: [phone] Office: [phone]   Devine Klingel B Avalene Sealy 01/23/2019, 11:20 AM

## 2019-01-23 NOTE — Progress Notes (Addendum)
  Subjective: Patient examined, most recent CXR image reviewed She had a R pleurx catheter placed earlier this year for recurrent pleural effusion from CHF, cirrhosis.  Cxr shows the pleurx is in good position and minimal fluid  present.Catheter drained yesterday > 50 ml. Will order daily catheter drainage while in hospital then transition to M-W-F at home. CXR ordered for am. Will follow.  Objective: Vital signs in last 24 hours: Temp:  [97.5 F (36.4 C)-98.1 F (36.7 C)] 97.5 F (36.4 C) (04/30 1127) Pulse Rate:  [84-99] 84 (04/30 1127) Cardiac Rhythm: Ventricular paced (04/30 0700) Resp:  [20-24] 20 (04/30 1127) BP: (86-117)/(49-79) 116/59 (04/30 1127) SpO2:  [92 %-98 %] 92 % (04/30 1127) Weight:  [109.9 kg-112.1 kg] 109.9 kg (04/30 0640)  Hemodynamic parameters for last 24 hours:  afebrile  Intake/Output from previous day: 04/29 0701 - 04/30 0700 In: 1200 [P.O.:1200] Out: 350 [Urine:350] Intake/Output this shift: Total I/O In: 720 [P.O.:720] Out: 100 [Urine:100]  EXAM Comfortable respirations on CPAP R pleurx dressing dry Breath sounds present bilaterally   Lab Results: Recent Labs    01/22/19 0409 01/23/19 0407  WBC 10.6* 10.6*  HGB 7.7* 7.6*  HCT 25.2* 24.3*  PLT 303 297   BMET:  Recent Labs    01/22/19 0409 01/23/19 0407  NA 125* 122*  K 3.7 4.2  CL 83* 81*  CO2 24 27  GLUCOSE 177* 159*  BUN 143* 148*  CREATININE 4.74* 5.37*  CALCIUM 8.8* 8.6*    PT/INR: No results for input(s): LABPROT, INR in the last 72 hours. ABG    Component Value Date/Time   PHART 7.507 (H) 11/10/2018 1655   HCO3 32.0 (H) 11/10/2018 1655   TCO2 28 09/23/2018 1945   ACIDBASEDEF 3.5 (H) 11/29/2010 0510   O2SAT 96.0 11/10/2018 1655   CBG (last 3)  No results for input(s): GLUCAP in the last 72 hours.  Assessment/Plan: S/P  Advanced HF with worsening renal fx Daily R pleurx drainage while in hospital for comfort CXR in am  LOS: 1 day    Tharon Aquas Trigt  III 01/23/2019

## 2019-01-23 NOTE — TOC Progression Note (Signed)
Transition of Care [facility]) - Progression Note    Patient Details  Name: [name] [name] MRN: [id number] Date of Birth: 11/23/1945  Transition of Care [facility]) CM/SW Contact  Sherrilyn Rist Phone Number: [phone] 01/23/2019, 3:05 PM  Clinical Narrative:    CM talked to daughter Marcie Bal for [place], she chose [facility]; referral made as requested; clinical information faxed to [phone];   Expected Discharge Plan: Home w Hospice Care Barriers to Discharge: No Barriers Identified  Expected Discharge Plan and Services Expected Discharge Plan: Home w Hospice Care In-house Referral: Hospice / Palliative Care Discharge Planning Services: CM Consult Post Acute Care Choice: NA Living arrangements for the past 2 months: Single Family Home                 DME Arranged: N/A DME Agency: NA       HH Arranged: RN [place] Agency: ([place]) Date HH Agency Contacted: 01/23/19 Time HH Agency Contacted: 1503 Representative spoke with at [place]: [facility]   Social Determinants of Health ([place]) Interventions    Readmission Risk Interventions Readmission Risk Prevention Plan 12/10/2018  Transportation Screening Complete  Medication Review Press photographer) Patient Refused  PCP or Specialist appointment within 3-5 days of discharge Complete  HRI or [place] Not Complete  HRI or Home Care Consult Pt Refusal Comments Need to find home health agency that can manage pleurex drain.  SW Recovery Care/Counseling Consult Complete  Palliative Care Screening Not Applicable  Skilled Nursing Facility Not Applicable  Some recent data might be hidden

## 2019-01-23 NOTE — Progress Notes (Signed)
Patient woke up with cramping in legs again. RN paged [facility]ad, patient requesting robaxin as she takes this at home.    Patient also having episodes of elevated heart rate jumping into 150s and 160s, triad notified.  Seems to happen when patient is moving.

## 2019-01-24 ENCOUNTER — Inpatient Hospital Stay (HOSPITAL_COMMUNITY): Payer: Medicare Other

## 2019-01-24 DIAGNOSIS — J90 Pleural effusion, not elsewhere classified: Secondary | ICD-10-CM

## 2019-01-24 MED ORDER — METHYLNALTREXONE BROMIDE 12 MG/0.6ML ~~LOC~~ SOLN
12.0000 mg | Freq: Once | SUBCUTANEOUS | Status: DC
  Filled 2019-01-24: qty 0.6

## 2019-01-24 MED ORDER — ALLOPURINOL 100 MG PO TABS
100.0000 mg | ORAL_TABLET | Freq: Every evening | ORAL | Status: DC
  Administered 2019-01-24: 100 mg via ORAL
  Filled 2019-01-24: qty 1

## 2019-01-24 MED ORDER — LORAZEPAM 1 MG PO TABS: 1.0000 mg | ORAL_TABLET | ORAL | Status: DC | PRN

## 2019-01-24 MED ORDER — HYDROMORPHONE HCL 2 MG PO TABS
4.0000 mg | ORAL_TABLET | ORAL | Status: DC | PRN
  Administered 2019-01-24 – 2019-01-25 (×2): 4 mg via ORAL
  Filled 2019-01-24 (×2): qty 2

## 2019-01-24 NOTE — Progress Notes (Signed)
Drained 100 ml from Pleurx drain.

## 2019-01-24 NOTE — Progress Notes (Signed)
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  Has oxygen desaturation to 88% on room air.   Patient was found to have negative COVID-19 test, positive troponin 0.03, BNP 135, WBC 10.6, worsening renal function with creatinine increased from recent 3.1-4.74, BUN 143, sodium 125, temperature normal, tachycardia, soft blood pressure.  Chest x-ray showed cardiomegaly and interstitial edema, and the bilateral airspace disease.  Due to worsening renal function and hyponatremia, did not restart IV diuretics. Pt is admitted to SDU as inpt. I requested card consult via Epic, hoping Dr. Clayborne Dana team to see patient.   -card consult is requested via Epic, hoping Dr. Letha Cape team to see patient.      Subjective: 5/1 A/O x4, patient on BiPAP resting comfortably.  Has some questions about transportation home tomorrow morning.  Understands that hospice will be ready to meet her at her house in the a.m.    Assessment & Plan:   Principal Problem:   Acute on chronic combined systolic and diastolic congestive heart failure, NYHA class 4 (HCC) Active Problems:   OSA (obstructive sleep apnea)   Permanent atrial fibrillation   CKD (chronic kidney disease), stage V (HCC)   Chest pain   Hypotension, chronic   Anemia due to chronic kidney disease   Umbilical hernia   Acute pulmonary edema (HCC)   Acute on chronic CHF with exacerbation -Patient with baseline very poor health condition with severe CHF and progressive CKD presenting with worsening SOB and severe left-sided chest pain (see below) -Normal WBC count, does not  appear to be infectious at this time -Elevated BNP, but mildly so -CXR seems to clearly indicate CHF and volume overload -With elevated BNP and abnl CXR, CHF seems most probable as diagnosis -Will admit to SDU -1/13 echo with EF 30-35% which was worse than in 10/19 (40-45%) -Will continue ASA -No ACE due to CDK -No beta blocker due to hypotension -CHF order set utilized -Cardiology/CHF team consulted -She was not given Lasix in the ER -Will start with continuous Lasix infusion rather than boluses, due to hypotension and an improved ability to titrate based on BP -Continue [place] O2 for now -Repeat EKG in AM -Will r/o with serial troponins although doubt ACS based on symptoms -If she is unable to tolerate diuresis with medication, dialysis may need to be considered; however, she is considered to be a poor candidate for HD and so palliative care may need to be considered.  Palliative care consultation for goals of care has been placed.   - Nephrology and palliative care have spoken with family and patient and have decided on hospice care.  Currently working on getting patient transported to [place] under [facility].   Chest pain -Patient's symptoms are not typical for ischemic pain in that she is having excruciating continuous pain -Ruling out with serial troponins, as above, but her mild troponin elevation on presentation is better than her prior and is not expectedly abnormal given her renal dysfunction -Her pain may be pleuritic in nature given the volume overload, and diuresis should help with that -I specifically looked for shingles since neuropathic pain would be consistent with her  complaint, but do not currently see any vesicles suggestive of this -Will give Neurontin 300 mg x 1 to see if this improves her pain -I have also ordered Fentanyl, although this may need to be held for hypotension -Will continue her home PO medication -She was tested and found to be negative for  COVID-19 infection - See acute on chronic CHF   Hypotension -This appears to be a chronic issue for her, likely associated with her cardiorenal failure -She was started on Midodrine per nephrology -I have requested nephrology assistance in managing this patient - See acute on chronic CHF   Stage IV-V CKD with anemia -Appears to be progressive -She is thought to be a poor candidate for HD -If no HD and she does not respond to diuresis, she may need to consider hospice -Anemia appears to be stable at this time - See acute on chronic CHF  Hyponatremia - See acute on chronic CHF    OSA -Continue CPAP - See acute on chronic CHF   Afib  -Rate controlled with PPM -No anticoagulation for a multitude of reasons including recent GI bleeding - See acute on chronic CHF   Umbilical hernia -She reports that this is chronic and stable -Could consider imaging for further evaluation -However, at this time, her pain is under her left breast and so does not appear to be related - See acute on chronic CHF   Recurrent pleural effusion -effusion is on the right -Pleurx catheter placed on 2/17 -Drainage every other day - See acute on chronic CHF   DVT prophylaxis: SCD Code Status: DNR Family Communication: Palliative care and nephrology spoke with patient and daughter and described in detail plan of care Disposition Plan: [facility] in [place]   Consultants:  Palliative care Nephrology Heart failure team  Procedures/Significant Events:     I have personally reviewed and interpreted all radiology studies and my findings are as above.  VENTILATOR SETTINGS:    Cultures   Antimicrobials:    Devices    LINES / TUBES:      Continuous Infusions: . sodium chloride       Objective: Vitals:   01/23/19 1928 01/24/19 0034 01/24/19 0412 01/24/19 0415  BP: 98/72  95/61   Pulse: 92 80 95   Resp: (!) 24 (!) 22 16   Temp: 97.9 F (36.6 C)  (!) 97.5 F  (36.4 C)   TempSrc: Oral  Oral   SpO2: 90% 93% 93%   Weight:    112.4 kg  Height:        Intake/Output Summary (Last 24 hours) at 01/24/2019 0803 Last data filed at 01/24/2019 0755 Gross per 24 hour  Intake 1803 ml  Output 500 ml  Net 1303 ml   Filed Weights   01/23/19 0054 01/23/19 0640 01/24/19 0415  Weight: 112.1 kg 109.9 kg 112.4 kg    General: A/O x4, positive acute on chronic respiratory distress Eyes: negative scleral hemorrhage, negative anisocoria, negative icterus ENT: Negative Runny nose, negative gingival bleeding, Neck:  Negative scars, masses, torticollis, lymphadenopathy, JVD Lungs: Clear to auscultation bilaterally without wheezes or crackles Cardiovascular: Regular rate and rhythm without murmur gallop or rub normal S1 and S2 Abdomen: Morbidly obese, negative abdominal pain, nondistended, positive soft, bowel sounds, no rebound, no ascites, no appreciable mass Psychiatric:  Negative depression, negative anxiety, negative fatigue, negative mania  Central nervous system:  Cranial nerves II through XII intact, tongue/uvula midline, all extremities muscle strength 5/5, sensation intact throughout,  negative dysarthria, negative expressive aphasia, negative receptive aphasia.    .     Data Reviewed: Care during the described time interval was provided by me .  I have reviewed this patient's available data, including medical history, events of note, physical examination, and all test results as part of my evaluation.   CBC: Recent Labs  Lab 01/22/19 0409 01/23/19 0407  WBC 10.6* 10.6*  NEUTROABS  --  9.1*  HGB 7.7* 7.6*  HCT 25.2* 24.3*  MCV 86.9 85.0  PLT 303 712   Basic Metabolic Panel: Recent Labs  Lab 01/22/19 0409 01/23/19 0407  NA 125* 122*  K 3.7 4.2  CL 83* 81*  CO2 24 27  GLUCOSE 177* 159*  BUN 143* 148*  CREATININE 4.74* 5.37*  CALCIUM 8.8* 8.6*   GFR: Estimated Creatinine Clearance: 11.9 mL/min (A) (by C-G formula based on SCr of 5.37  mg/dL (H)). Liver Function Tests: No results for input(s): AST, ALT, ALKPHOS, BILITOT, PROT, ALBUMIN in the last 168 hours. No results for input(s): LIPASE, AMYLASE in the last 168 hours. No results for input(s): AMMONIA in the last 168 hours. Coagulation Profile: No results for input(s): INR, PROTIME in the last 168 hours. Cardiac Enzymes: Recent Labs  Lab 01/22/19 0409 01/22/19 0840 01/22/19 1425 01/22/19 2003  TROPONINI 0.03* 0.03* 0.03* 0.03*   BNP (last 3 results) No results for input(s): PROBNP in the last 8760 hours. HbA1C: No results for input(s): HGBA1C in the last 72 hours. CBG: No results for input(s): GLUCAP in the last 168 hours. Lipid Profile: No results for input(s): CHOL, HDL, LDLCALC, TRIG, CHOLHDL, LDLDIRECT in the last 72 hours. Thyroid Function Tests: No results for input(s): TSH, T4TOTAL, FREET4, T3FREE, THYROIDAB in the last 72 hours. Anemia Panel: No results for input(s): VITAMINB12, FOLATE, FERRITIN, TIBC, IRON, RETICCTPCT in the last 72 hours. Urine analysis:    Component Value Date/Time   COLORURINE STRAW (A) 12/31/2018 2015   APPEARANCEUR CLEAR 12/31/2018 2015   LABSPEC 1.009 12/31/2018 2015   PHURINE 6.0 12/31/2018 2015   GLUCOSEU NEGATIVE 12/31/2018 2015   HGBUR SMALL (A) 12/31/2018 2015   BILIRUBINUR NEGATIVE 12/31/2018 2015   KETONESUR NEGATIVE 12/31/2018 2015   PROTEINUR NEGATIVE 12/31/2018 2015   UROBILINOGEN 0.2 03/06/2013 2035   NITRITE NEGATIVE 12/31/2018 2015   LEUKOCYTESUR MODERATE (A) 12/31/2018 2015   Sepsis Labs: @LABRCNTIP (procalcitonin:4,lacticidven:4)  ) Recent Results (from the past 240 hour(s))  SARS Coronavirus 2 [facility] order, Performed in [place] hospital lab)     Status: None   Collection Time: 01/22/19  5:01 AM  Result Value Ref Range Status   SARS Coronavirus 2 NEGATIVE NEGATIVE Final    Comment: (NOTE) If result is NEGATIVE SARS-CoV-2 target nucleic acids are NOT DETECTED. The SARS-CoV-2 RNA is  generally detectable in upper and lower  respiratory specimens during the acute phase of infection. The lowest  concentration of SARS-CoV-2 viral copies this assay can detect is 250  copies / mL. A negative result does not preclude SARS-CoV-2 infection  and should not be used as the sole basis for treatment or other  patient management decisions.  A negative result may occur with  improper specimen collection / handling, submission of specimen other  than nasopharyngeal swab, presence of viral mutation(s) within the  areas targeted by this assay, and inadequate number of viral copies  (<250 copies / mL). A negative result must be combined with clinical  observations, patient history, and epidemiological information. If result is POSITIVE SARS-CoV-2 target nucleic  acids are DETECTED. The SARS-CoV-2 RNA is generally detectable in upper and lower  respiratory specimens dur ing the acute phase of infection.  Positive  results are indicative of active infection with SARS-CoV-2.  Clinical  correlation with patient history and other diagnostic information is  necessary to determine patient infection status.  Positive results do  not rule out bacterial infection or co-infection with other viruses. If result is PRESUMPTIVE POSTIVE SARS-CoV-2 nucleic acids MAY BE PRESENT.   A presumptive positive result was obtained on the submitted specimen  and confirmed on repeat testing.  While 2019 novel coronavirus  (SARS-CoV-2) nucleic acids may be present in the submitted sample  additional confirmatory testing may be necessary for epidemiological  and / or clinical management purposes  to differentiate between  SARS-CoV-2 and other Sarbecovirus currently known to infect humans.  If clinically indicated additional testing with an alternate test  methodology [phone]) is advised. The SARS-CoV-2 RNA is generally  detectable in upper and lower respiratory sp ecimens during the acute  phase of infection.  The expected result is Negative. Fact Sheet for Patients:  StrictlyIdeas.no Fact Sheet for Healthcare Providers: BankingDealers.co.za This test is not yet approved or cleared by the [place] FDA and has been authorized for detection and/or diagnosis of SARS-CoV-2 by FDA under an Emergency Use Authorization (EUA).  This EUA will remain in effect (meaning this test can be used) for the duration of the COVID-19 declaration under Section 564(b)(1) of the Act, 21 U.S.C. section 360bbb-3(b)(1), unless the authorization is terminated or revoked sooner. Performed at [facility], [place] [address], [place], [place] [address]          Radiology Studies: Dg Chest Port 1 View  Result Date: 01/24/2019 CLINICAL DATA:  Chest pain EXAM: PORTABLE CHEST 1 VIEW COMPARISON:  01/22/2019 FINDINGS: Stable cardiomegaly.  Left subclavian pacemaker. No frank interstitial edema. Small right pleural effusion, unchanged. Left lung base is obscured. No pneumothorax. IMPRESSION: Stable cardiomegaly with small right pleural effusion. No frank interstitial edema. Electronically Signed   By: Julian Hy M.D.   On: 01/24/2019 07:47        Scheduled Meds: . allopurinol  300 mg Oral QPM  . aspirin EC  325 mg Oral QODAY  . feeding supplement  1 Container Oral BID  . lactose free nutrition  237 mL Oral BID  . lidocaine  1 patch Transdermal Q24H  . midodrine  5 mg Oral TID WC  . pantoprazole  40 mg Oral BID  . sodium chloride flush  3 mL Intravenous Q12H   Continuous Infusions: . sodium chloride       LOS: 2 days   Time spent: 40 minutes     , Geraldo Docker, MD [facility] Pager [phone]  If 7PM-7AM, please contact night-coverage [email or web address] Password [email or web address] 01/24/2019, 8:03 AM

## 2019-01-24 NOTE — Progress Notes (Signed)
  Subjective: Right Pleurx catheter check Catheter drained today 100 cc thin fluid Chest x-ray today shows minimal right pleural effusion with catheter good position Patient continues to have deterioration in renal function with creatinine now 5.4 Continue daily Pleurx drainage for comfort Objective: Vital signs in last 24 hours: Temp:  [97.5 F (36.4 C)-97.9 F (36.6 C)] 97.5 F (36.4 C) (05/01 0412) Pulse Rate:  [80-98] 95 (05/01 0412) Cardiac Rhythm: A-V Sequential paced (05/01 0705) Resp:  [16-24] 16 (05/01 0412) BP: (95-116)/(59-72) 95/61 (05/01 0412) SpO2:  [90 %-98 %] 93 % (05/01 0412) Weight:  [112.4 kg] 112.4 kg (05/01 0415)  Hemodynamic parameters for last 24 hours:  Stable afebrile  Intake/Output from previous day: 04/30 0701 - 05/01 0700 In: 1563 [P.O.:1560; I.V.:3] Out: 500 [Urine:400; Chest Tube:100] Intake/Output this shift: Total I/O In: 340 [P.O.:340] Out: -  Exam Pleurx catheter secure, dressing dry Breath sounds clear bilaterally Lab Results: Recent Labs    01/22/19 0409 01/23/19 0407  WBC 10.6* 10.6*  HGB 7.7* 7.6*  HCT 25.2* 24.3*  PLT 303 297   BMET:  Recent Labs    01/22/19 0409 01/23/19 0407  NA 125* 122*  K 3.7 4.2  CL 83* 81*  CO2 24 27  GLUCOSE 177* 159*  BUN 143* 148*  CREATININE 4.74* 5.37*  CALCIUM 8.8* 8.6*    PT/INR: No results for input(s): LABPROT, INR in the last 72 hours. ABG    Component Value Date/Time   PHART 7.507 (H) 11/10/2018 1655   HCO3 32.0 (H) 11/10/2018 1655   TCO2 28 09/23/2018 1945   ACIDBASEDEF 3.5 (H) 11/29/2010 0510   O2SAT 96.0 11/10/2018 1655   CBG (last 3)  No results for input(s): GLUCAP in the last 72 hours.  Assessment/Plan: S/P Right Pleurx catheter placed earlier this year for recurrent nonmalignant effusion Patient with progression of oliguric renal failure Continue to drain catheter daily while in hospital then transition back to previous outpatient schedule    LOS: 2 days     Tharon Aquas Trigt III 01/24/2019

## 2019-01-24 NOTE — Progress Notes (Signed)
Pt refused to stand for daily Weight this morning. Pt was educated on the importance of standing weights. Bed Weight was obtained.

## 2019-01-24 NOTE — Progress Notes (Signed)
Patient ID: [name] [name], female   DOB: 10/24/1945, 73 y.o.   MRN: [id number]  Renal function noted to worsen again today.  400 cc UOP last 24hrs.  Not HD candidate.  Plan for home with hospice care noted.   Cardiology will sign off, please call with any questions.   Loralie Champagne 01/24/2019

## 2019-01-24 NOTE — TOC Progression Note (Addendum)
Transition of Care [facility]) - Progression Note    Patient Details  Name: MERARI PION MRN: [id number] Date of Birth: 1945-10-31  [facility]) CM/SW Contact  Sherrilyn Rist Phone Number: [phone] 01/24/2019, 2:04 PM  Clinical Narrative:    DCP - home with Hospice provided by [facility] [phone]). Erin at [facility] stated that a nurse will be able to see the patient tomorrow at his home if he is discharged. B Shannah Conteh RN,MHA,BSN  4:34 pm - Received message that pt/ daughter had questions about ambulance transportation across [employer] ( pt lives in [place]). Non emergent PTAR  called talked to [facility] concerning the cost of ambulance transportation - estimated fee of $1,379. 40 to be paid up front; information given to Marcie Bal ( daughter) she plans to take the patient home via private vehicle. [facility] called, talked to [place], they cannot give a letter of guarantee to the ambulance since they have not seen the patient. Home oxygen to be delivered to the home. CM informed Marcie Bal to bring an oxygen tank to the hospital for discharge. Mindi Slicker RN,MHA,BSN   Expected Discharge Plan: Home w Hospice Care Barriers to Discharge: No Barriers Identified  Expected Discharge Plan and Services Expected Discharge Plan: [place] In-house Referral: Hospice / Palliative Care Discharge Planning Services: CM Consult Post Acute Care Choice: NA Living arrangements for the past 2 months: Single Family Home                 DME Arranged: N/A DME Agency: NA       HH Arranged: RN [place] Agency: ([place]) Date [place]: 01/23/19 Time [place]: 44 Representative spoke with at [place]: [facility]   Social Determinants of Health (Bud) Interventions    Readmission Risk Interventions Readmission Risk Prevention Plan 12/10/2018  Transportation Screening Complete  Medication Review  Press photographer) Patient Refused  PCP or Specialist appointment within 3-5 days of discharge Complete  HRI or [place] Not Complete  HRI or Home Care Consult Pt Refusal Comments Need to find home health agency that can manage pleurex drain.  SW Recovery Care/Counseling Consult Complete  Palliative Care Screening Not Applicable  Skilled Nursing Facility Not Applicable  Some recent data might be hidden

## 2019-01-24 NOTE — Progress Notes (Signed)
Palliative Medicine RN Note:  Symptom check. Called Dr Hilma Favors to review medication use and recs for home meds given by [name] yesterday.  Due to increased pain complaints, Dr Hilma Favors transitioned [name] [name] to dilaudid PO and recommended continuing this at d/c instead of PO morphine. She also ordered Relistor for constipation x 1 dose.  Current PMT recs for home meds per Dr Hilma Favors:   1. Dilaudid 4 mg PO q 4 hours pain or dyspnea. Please specify on the script that pt is a hospice patient. Do not d/c with morphine.  2. Lorazepam 41m PO q 4 hours prn anxiety  [name] Royse G. Waldo Damian, RN, BSN, CMemorial Herm[facility] LLCPalliative Medicine Team 01/24/2019 11:41 AM Office [phone]

## 2019-01-24 NOTE — Progress Notes (Signed)
Pt refused to let RN drain pleurx cath. Pt states "It is too late and I am ready to go to bed". Dressing is clean, dry and intact with no drainage. Will continue to monitor pt.

## 2019-01-24 NOTE — Plan of Care (Signed)
  Problem: Education: Goal: Knowledge of General Education information will improve Description Including pain rating scale, medication(s)/side effects and non-pharmacologic comfort measures Outcome: Progressing   Problem: Health Behavior/Discharge Planning: Goal: Ability to manage health-related needs will improve Outcome: Progressing   Problem: Clinical Measurements: Goal: Ability to maintain clinical measurements within normal limits will improve Outcome: Progressing Goal: Will remain free from infection Outcome: Progressing Goal: Diagnostic test results will improve Outcome: Progressing Goal: Cardiovascular complication will be avoided Outcome: Progressing   Problem: Activity: Goal: Risk for activity intolerance will decrease Outcome: Progressing   Problem: Nutrition: Goal: Adequate nutrition will be maintained Outcome: Progressing   Problem: Elimination: Goal: Will not experience complications related to bowel motility Outcome: Progressing Goal: Will not experience complications related to urinary retention Outcome: Progressing   Problem: Pain Managment: Goal: General experience of comfort will improve Outcome: Progressing   Problem: Safety: Goal: Ability to remain free from injury will improve Outcome: Progressing   Problem: Skin Integrity: Goal: Risk for impaired skin integrity will decrease Outcome: Progressing   Problem: Education: Goal: Ability to demonstrate management of disease process will improve Outcome: Progressing Goal: Ability to verbalize understanding of medication therapies will improve Outcome: Progressing   Problem: Cardiac: Goal: Ability to achieve and maintain adequate cardiopulmonary perfusion will improve Outcome: Progressing

## 2019-01-24 NOTE — Consult Note (Signed)
   [facility] CM Inpatient Consult   01/24/2019  [name] [birth year] [phone]    Patient screened for potential [facility] CM service needs as benefit from Medicare/ Next Gen plan with an extreme high risk of 46% for unplanned readmissions; has 6 hospitalizations in the past 6 months and a 30 day readmission.  Patient's primary care provider is Dr. [name] with [facility] at [place].  History and physical dated 01/22/19 reveals as follows: [name] is a 73 y.o. female with medical history significant of hypertension, atrial fibrillation-not on anticoagulants, GI bleeding, CHF with EF 30-35%, sinoatrial node dysfunction, PPM placement, OSA, CKD-4, GERD, gout, iron deficiency anemia, GI bleeding Per chart review, patient presented with chest pain and shortness of breath. (Acute on chronic congestive heart failure with exacerbation).  Chart review of Palliative Care team and Transition of care CM note reveal that patient's current discharge plan is Home with Hospice Care and daughter chose [facility].  There are no identified [facility] follow up needs at this point, patient will have full care under Hospice.    For questions and additional information, please call:  Brainard [place] A. Kinlie Janice, BSN, RN-BC [facility] Liaison Cell: [phone]

## 2019-01-24 NOTE — Progress Notes (Signed)
Pt refusing bed alarm. When RN tried helping pt to [facility] pt states "leave me alone I can do it myself". Pt educated and still refusing. Will continue to monitor pt.

## 2019-01-25 MED ORDER — MIDODRINE HCL 5 MG PO TABS: 5.0000 mg | ORAL_TABLET | Freq: Three times a day (TID) | ORAL | 0 refills | Status: AC

## 2019-01-25 MED ORDER — LORAZEPAM 1 MG PO TABS: 1.0000 mg | ORAL_TABLET | ORAL | 0 refills | Status: AC | PRN

## 2019-01-25 MED ORDER — ASPIRIN 325 MG PO TBEC: 325.0000 mg | DELAYED_RELEASE_TABLET | ORAL | 0 refills | Status: AC

## 2019-01-25 MED ORDER — LIDOCAINE 5 % EX PTCH: 1.0000 | MEDICATED_PATCH | CUTANEOUS | 0 refills | Status: AC

## 2019-01-25 MED ORDER — ONDANSETRON HCL 4 MG PO TABS: 4.0000 mg | ORAL_TABLET | Freq: Four times a day (QID) | ORAL | 0 refills | Status: AC | PRN

## 2019-01-25 MED ORDER — HYDROXYZINE HCL 25 MG PO TABS: 25.0000 mg | ORAL_TABLET | Freq: Three times a day (TID) | ORAL | 0 refills | Status: AC | PRN

## 2019-01-25 MED ORDER — SORBITOL 70 % SOLN: 30.0000 mL | 0 refills | Status: AC | PRN

## 2019-01-25 MED ORDER — CAMPHOR-MENTHOL 0.5-0.5 % EX LOTN: 1.0000 "application " | TOPICAL_LOTION | Freq: Three times a day (TID) | CUTANEOUS | 0 refills | Status: AC | PRN

## 2019-01-25 MED ORDER — ACETAMINOPHEN 325 MG PO TABS: 650.0000 mg | ORAL_TABLET | Freq: Four times a day (QID) | ORAL | 0 refills | Status: AC | PRN

## 2019-01-25 MED ORDER — HYDROMORPHONE HCL 4 MG PO TABS: 4.0000 mg | ORAL_TABLET | ORAL | 0 refills | Status: AC | PRN

## 2019-01-25 MED ORDER — ALLOPURINOL 100 MG PO TABS: 100.0000 mg | ORAL_TABLET | Freq: Every evening | ORAL | 0 refills | Status: AC

## 2019-01-25 MED ORDER — METHOCARBAMOL 500 MG PO TABS: 500.0000 mg | ORAL_TABLET | Freq: Three times a day (TID) | ORAL | 0 refills | Status: AC | PRN

## 2019-01-25 MED ORDER — POLYETHYLENE GLYCOL 3350 17 G PO PACK: 17.0000 g | PACK | Freq: Every day | ORAL | 0 refills | Status: AC | PRN

## 2019-01-25 MED ORDER — METHYLNALTREXONE BROMIDE 12 MG/0.6ML ~~LOC~~ SOLN: 12.0000 mg | Freq: Once | SUBCUTANEOUS | 0 refills | Status: AC

## 2019-01-25 NOTE — Discharge Summary (Signed)
Physician Discharge Summary  [name] [name] KCL:[id number] DOB: [birth year] DOA: 01/22/2019  PCP: [name], MD  Admit date: 01/22/2019 Discharge date: 01/25/2019  Time spent: 35 minutes  Recommendations for Outpatient Follow-up:  Acute on chronic CHF with exacerbation -Patient withbaseline very poor health condition with severe CHF and progressive CKDpresenting with worsening SOB and severe left-sided chest pain (see below) -Elevated BNP, but mildly so -CXR seems to clearly indicate CHF and volume overload -With elevated BNP and abnl CXR, CHF seemsmostprobable as diagnosis -1/13 echo with EF 30-35% which was worse than in 10/19 (40-45%) -WillcontinueASA -No ACE due to CDK -No beta blocker due tohypotension -Cardiology/CHF team consulted -Will start with continuous Lasix infusion rather than boluses, due to hypotension and an improved ability to titrate based on BP -Continue [place] O2 for now -she is unable to tolerate diuresis with medication, HD was considered but patient found to be poor candidate.   - Nephrology and palliative care have spoken with family and patient and have decided on hospice care.    Discharged to [place] under [facility].  Chest pain -Patient's symptoms are not typical for ischemic pain in that she is having excruciating continuous pain -mild troponin elevation on presentation is better than her prior and is not expectedly abnormal given her renal dysfunction -Her pain may be pleuritic in nature given the volume overload, and diuresis should help with that -I specifically looked for shingles since neuropathic pain would be consistent with her complaint, but do not currently see any vesicles suggestive of this -Will give Neurontin 300 mg x 1 to see if this improves her pain -She was tested and found to be negative for COVID-19 infection - See acute on chronic CHF  Hypotension -This appears to be a chronic issue for her, likely associated  with her cardiorenal failure -She was started on Midodrine per nephrology - See acute on chronic CHF  Stage IV-V CKD with anemia -Appears to be progressive -She is thought to be a poor candidate for HD -Anemia appears to be stable at this time - See acute on chronic CHF  Hyponatremia - See acute on chronic CHF   OSA -Continue CPAP - See acute on chronic CHF  Afib  -Rate controlled with PPM -No anticoagulation for a multitude of reasons including recent GI bleeding - See acute on chronic CHF  Umbilical hernia -She reports that this is chronic and stable -However, at this time, her pain is under her left breast and so does not appear to be related - See acute on chronic CHF  Recurrent pleural effusion -effusion is on the right -Pleurx catheter placed on 2/17 -Drainage every other day - See acute on chronic CHF   Discharge Diagnoses:  Principal Problem:   Acute on chronic combined systolic and diastolic congestive heart failure, NYHA class 4 (HCC) Active Problems:   OSA (obstructive sleep apnea)   Permanent atrial fibrillation   CKD (chronic kidney disease), stage V (HCC)   Chest pain   Hypotension, chronic   Anemia due to chronic kidney disease   Umbilical hernia   Acute pulmonary edema ([place])   Discharge Condition: Guarded  Diet recommendation: Heart 1500 mL fluid restriction Filed Weights   01/23/19 0640 01/24/19 0415 01/25/19 0349  Weight: 109.9 kg 112.4 kg 110.1 kg    History of present illness:  73 year old lady WF PMHx HTN, A-fibrillation not on anticoagulants,  Chronic and Diastolic CHF with EF 49-44%, Sinoatrial node dysfunction, PPM placement, atrial fibrillation, NICM, OSA,  CKD stage 4, GERD, gout, iron deficiency anemia, GI bleeding,   Presents with chest pain and shortness of breath.  Has oxygen desaturation to 88% on room air.   Patient was found to have negative COVID-19 test, positive troponin 0.03, BNP 135, WBC 10.6, worsening renal  function with creatinine increased from recent 3.1-4.74, BUN 143, sodium 125, temperature normal, tachycardia, soft blood pressure.  Chest x-ray showed cardiomegaly and interstitial edema, and the bilateral airspace disease.  Due to worsening renal function and hyponatremia, did not restart IV diuretics. Pt is admitted to SDU as inpt. I requested card consult via Epic, hoping Dr. Clayborne Dana team to see patient.   During this hospitalization patient was seen by  nephrology to discuss her worsening renal status.  They discussed with patient that she would be a poor candidate for dialysis.  Patient decided to proceed to hospice care.     Consultations: Cardiology Nephrology   Antibiotics Anti-infectives (From admission, onward)   None       Discharge Exam: Vitals:   01/24/19 0415 01/24/19 1119 01/24/19 1957 01/25/19 0349  BP:  120/69 (!) 143/68   Pulse:  (!) 165 97   Resp:   18   Temp:  (!) 97.3 F (36.3 C) 97.6 F (36.4 C)   TempSrc:  Oral Oral   SpO2:  95% 94%   Weight: 112.4 kg   110.1 kg  Height:        General: A/O x4, positive acute on chronic respiratory distress Eyes: negative scleral hemorrhage, negative anisocoria, negative icterus ENT: Negative Runny nose, negative gingival bleeding, Neck:  Negative scars, masses, torticollis, lymphadenopathy, JVD Lungs: Clear to auscultation bilaterally without wheezes or crackles Cardiovascular: Regular rate and rhythm without murmur gallop or rub normal S1 and S2   Discharge Instructions   Allergies as of 01/25/2019      Reactions   Beta Adrenergic Blockers Anaphylaxis, Other (See Comments)   "DEATH" (Bradycardia and her organs shut down)   Cephalexin Shortness Of Breath   Codeine Anaphylaxis, Swelling   Throat swelling   Contrast Media [iodinated Diagnostic Agents] Other (See Comments)   Patient states "I have chronic kidney disease, so the doctor said no dye in my veins."   Coreg [carvedilol] Other (See Comments)    Beta Blockers cause her organs to shut down (per patient)   Peppermint Flavor Shortness Of Breath   Prednisone Anaphylaxis, Shortness Of Breath, Swelling   Throat swells    Tape Other (See Comments)   States plastic tape blisters her skin   Ciprofloxacin Hives   Tolerating levofloxin    Latex Itching, Rash   Penicillins Hives   DID THE REACTION INVOLVE: Swelling of the face/tongue/throat, SOB, or low BP? Yes Sudden or severe rash/hives, skin peeling, or the inside of the mouth or nose? Yes Did it require medical treatment? Pt was in hospital at the time of reaction When did it last happen?Was in her mid-40's If all above answers are "NO", may proceed with cephalosporin use.   Iron Other (See Comments)   Per Patient- Felt like skin was crawling, foaming at mouth, and felt like she was going "crazy"   Diamox [acetazolamide] Rash   Rash after 2 doses of diamox       Medication List    STOP taking these medications   HYDROcodone-acetaminophen 10-325 MG tablet Commonly known as:  NORCO   potassium chloride SA 20 MEQ tablet Commonly known as:  K-DUR     TAKE these medications  acetaminophen 325 MG tablet Commonly known as:  TYLENOL Take 2 tablets (650 mg total) by mouth every 6 (six) hours as needed for mild pain (or Fever >/= 101).   allopurinol 100 MG tablet Commonly known as:  ZYLOPRIM Take 1 tablet (100 mg total) by mouth every evening. What changed:    medication strength  how much to take   aspirin 325 MG EC tablet Take 1 tablet (325 mg total) by mouth every other day.   calcitRIOL 0.25 MCG capsule Commonly known as:  ROCALTROL Take 0.25 mcg by mouth every other day.   camphor-menthol lotion Commonly known as:  SARNA Apply 1 application topically every 8 (eight) hours as needed for itching.   docusate sodium 100 MG capsule Commonly known as:  COLACE Take 100 mg by mouth daily as needed for mild constipation.   feeding supplement Liqd Take 1 Container by  mouth 2 (two) times daily.   ferrous sulfate 325 (65 FE) MG tablet Take 325 mg by mouth at bedtime.   HYDROmorphone 4 MG tablet Commonly known as:  DILAUDID Take 1 tablet (4 mg total) by mouth every 4 (four) hours as needed for up to 5 days (pain or dyspnea).   hydrOXYzine 25 MG tablet Commonly known as:  ATARAX/VISTARIL Take 1 tablet (25 mg total) by mouth every 8 (eight) hours as needed for itching.   lidocaine 5 % Commonly known as:  LIDODERM Place 1 patch onto the skin daily. Remove & Discard patch within 12 hours or as directed by MD   LORazepam 1 MG tablet Commonly known as:  ATIVAN Take 1 tablet (1 mg total) by mouth every 4 (four) hours as needed (dyspnea).   methocarbamol 500 MG tablet Commonly known as:  ROBAXIN Take 1 tablet (500 mg total) by mouth every 8 (eight) hours as needed for muscle spasms.   methylnaltrexone 12 MG/0.6ML Soln injection Commonly known as:  RELISTOR Inject 0.6 mLs (12 mg total) into the skin once for 1 dose.   metolazone 2.5 MG tablet Commonly known as:  ZAROXOLYN Take 1 tablet (2.5 mg total) by mouth See admin instructions. Take 1 tablet by mouth every Monday, Wednesday, Friday.   midodrine 5 MG tablet Commonly known as:  PROAMATINE Take 1 tablet (5 mg total) by mouth 3 (three) times daily with meals.   Misc. Devices Misc at bedtime. C-PAP   ondansetron 4 MG tablet Commonly known as:  ZOFRAN Take 1 tablet (4 mg total) by mouth every 6 (six) hours as needed for nausea.   pantoprazole 40 MG tablet Commonly known as:  PROTONIX Take 1 tablet (40 mg total) by mouth 2 (two) times daily.   polyethylene glycol 17 g packet Commonly known as:  MIRALAX / GLYCOLAX Take 17 g by mouth daily as needed for mild constipation.   sorbitol 70 % Soln Take 30 mLs by mouth as needed for moderate constipation.   torsemide 20 MG tablet Commonly known as:  DEMADEX Take 4 tablets (80 mg total) by mouth 2 (two) times daily.   vitamin B-12 500 MCG  tablet Commonly known as:  CYANOCOBALAMIN Take 500 mcg by mouth daily.      Allergies  Allergen Reactions  . Beta Adrenergic Blockers Anaphylaxis and Other (See Comments)    "DEATH" (Bradycardia and her organs shut down)  . Cephalexin Shortness Of Breath  . Codeine Anaphylaxis and Swelling    Throat swelling  . Contrast Media [Iodinated Diagnostic Agents] Other (See Comments)    Patient  states "I have chronic kidney disease, so the doctor said no dye in my veins."  . Coreg [Carvedilol] Other (See Comments)    Beta Blockers cause her organs to shut down (per patient)  . Peppermint Flavor Shortness Of Breath  . Prednisone Anaphylaxis, Shortness Of Breath and Swelling    Throat swells   . Tape Other (See Comments)    States plastic tape blisters her skin  . Ciprofloxacin Hives    Tolerating levofloxin   . Latex Itching and Rash  . Penicillins Hives    DID THE REACTION INVOLVE: Swelling of the face/tongue/throat, SOB, or low BP? Yes Sudden or severe rash/hives, skin peeling, or the inside of the mouth or nose? Yes Did it require medical treatment? Pt was in hospital at the time of reaction When did it last happen?Was in her mid-40's If all above answers are "NO", may proceed with cephalosporin use.  . Iron Other (See Comments)    Per Patient- Felt like skin was crawling, foaming at mouth, and felt like she was going "crazy"  . Diamox [Acetazolamide] Rash    Rash after 2 doses of diamox    Follow-up Information    [facility] Follow up.   Why:  They will do your hospice care at your home Contact information: [address] [place] [address] telephone # [phone]           The results of significant diagnostics from this hospitalization (including imaging, microbiology, ancillary and laboratory) are listed below for reference.    Significant Diagnostic Studies: Dg Chest Port 1 View  Result Date: 01/24/2019 CLINICAL DATA:  Chest pain EXAM:  PORTABLE CHEST 1 VIEW COMPARISON:  01/22/2019 FINDINGS: Stable cardiomegaly.  Left subclavian pacemaker. No frank interstitial edema. Small right pleural effusion, unchanged. Left lung base is obscured. No pneumothorax. IMPRESSION: Stable cardiomegaly with small right pleural effusion. No frank interstitial edema. Electronically Signed   By: Julian Hy M.D.   On: 01/24/2019 07:47   Dg Chest Portable 1 View  Result Date: 01/22/2019 CLINICAL DATA:  Chest pain. EXAM: PORTABLE CHEST 1 VIEW COMPARISON:  One-view chest x-ray 12/31/2018 FINDINGS: The heart is enlarged. Interstitial edema is present. Bibasilar opacities likely represent a combination of effusions and airspace disease. Pacing wire is stable. IMPRESSION: 1. Cardiomegaly and interstitial edema consistent with congestive heart failure. 2. Bibasilar airspace opacities. This likely represents combination of edema and atelectasis. Infection is not excluded. Electronically Signed   By: San Morelle M.D.   On: 01/22/2019 04:51   Dg Chest Portable 1 View  Result Date: 12/31/2018 CLINICAL DATA:  Anemia. Epigastric region pain. History of atrial fibrillation EXAM: PORTABLE CHEST 1 VIEW COMPARISON:  December 02, 2018 FINDINGS: There is scarring in the lateral right base. There is no appreciable edema or consolidation. There is generalized cardiomegaly, stable. Pulmonary vascularity is normal. Pacemaker leads attached to the right ventricle. There is aortic atherosclerosis. No adenopathy. No pneumothorax. No bone lesions. IMPRESSION: Scarring lateral right base. No edema or consolidation. Generalized cardiomegaly. Pacemaker lead attached to right ventricle. Aortic Atherosclerosis (ICD10-I70.0). Electronically Signed   By: Lowella Grip III M.D.   On: 12/31/2018 21:31    Microbiology: Recent Results (from the past 240 hour(s))  SARS Coronavirus 2 [facility] order, Performed in [facility] hospital lab)     Status: None   Collection Time:  01/22/19  5:01 AM  Result Value Ref Range Status   SARS Coronavirus 2 NEGATIVE NEGATIVE Final    Comment: (NOTE)  If result is NEGATIVE SARS-CoV-2 target nucleic acids are NOT DETECTED. The SARS-CoV-2 RNA is generally detectable in upper and lower  respiratory specimens during the acute phase of infection. The lowest  concentration of SARS-CoV-2 viral copies this assay can detect is 250  copies / mL. A negative result does not preclude SARS-CoV-2 infection  and should not be used as the sole basis for treatment or other  patient management decisions.  A negative result may occur with  improper specimen collection / handling, submission of specimen other  than nasopharyngeal swab, presence of viral mutation(s) within the  areas targeted by this assay, and inadequate number of viral copies  (<250 copies / mL). A negative result must be combined with clinical  observations, patient history, and epidemiological information. If result is POSITIVE SARS-CoV-2 target nucleic acids are DETECTED. The SARS-CoV-2 RNA is generally detectable in upper and lower  respiratory specimens dur ing the acute phase of infection.  Positive  results are indicative of active infection with SARS-CoV-2.  Clinical  correlation with patient history and other diagnostic information is  necessary to determine patient infection status.  Positive results do  not rule out bacterial infection or co-infection with other viruses. If result is PRESUMPTIVE POSTIVE SARS-CoV-2 nucleic acids MAY BE PRESENT.   A presumptive positive result was obtained on the submitted specimen  and confirmed on repeat testing.  While 2019 novel coronavirus  (SARS-CoV-2) nucleic acids may be present in the submitted sample  additional confirmatory testing may be necessary for epidemiological  and / or clinical management purposes  to differentiate between  SARS-CoV-2 and other Sarbecovirus currently known to infect humans.  If clinically  indicated additional testing with an alternate test  methodology [phone]) is advised. The SARS-CoV-2 RNA is generally  detectable in upper and lower respiratory sp ecimens during the acute  phase of infection. The expected result is Negative. Fact Sheet for Patients:  StrictlyIdeas.no Fact Sheet for Healthcare Providers: BankingDealers.co.za This test is not yet approved or cleared by the [place] FDA and has been authorized for detection and/or diagnosis of SARS-CoV-2 by FDA under an Emergency Use Authorization (EUA).  This EUA will remain in effect (meaning this test can be used) for the duration of the COVID-19 declaration under Section 564(b)(1) of the Act, 21 U.S.C. section 360bbb-3(b)(1), unless the authorization is terminated or revoked sooner. Performed at [facility], [place] [address], [place], [place] [address]      Labs: Basic Metabolic Panel: Recent Labs  Lab 01/22/19 0409 01/23/19 0407  NA 125* 122*  K 3.7 4.2  CL 83* 81*  CO2 24 27  GLUCOSE 177* 159*  BUN 143* 148*  CREATININE 4.74* 5.37*  CALCIUM 8.8* 8.6*   Liver Function Tests: No results for input(s): AST, ALT, ALKPHOS, BILITOT, PROT, ALBUMIN in the last 168 hours. No results for input(s): LIPASE, AMYLASE in the last 168 hours. No results for input(s): AMMONIA in the last 168 hours. CBC: Recent Labs  Lab 01/22/19 0409 01/23/19 0407  WBC 10.6* 10.6*  NEUTROABS  --  9.1*  HGB 7.7* 7.6*  HCT 25.2* 24.3*  MCV 86.9 85.0  PLT 303 297   Cardiac Enzymes: Recent Labs  Lab 01/22/19 0409 01/22/19 0840 01/22/19 1425 01/22/19 2003  TROPONINI 0.03* 0.03* 0.03* 0.03*   BNP: BNP (last 3 results) Recent Labs    10/06/18 1309 11/05/18 2224 01/22/19 0418  BNP 214.8* 228.9* 135.3*    ProBNP (last 3 results) No results for input(s):  PROBNP in the last 8760 hours.  CBG: No results for input(s): GLUCAP in the last 168  hours.     Signed:  Dia Crawford, MD [facility] [phone] pager

## 2019-01-25 NOTE — Progress Notes (Signed)
Patient is discharged home with hospice care.  discharge and medication information teaching given with teach back.  DNR sheet was signed by Sherral Hammers MD and placed in patients discharge packet.  Patients' questions and concerns were answered, pt has no further questions at this time. Peripheral iv removed clean dry and intact, pressure and dressing applied. Patient was placed on 3L nasal canula while in her wheelchair to transport to the car where daughter Anjelika Ausburn was waiting. Patient's belongings with pt in a bag: cell phone, clothes, shoes, purse/wallet, assistive device and pillow for wheelchair.

## 2019-01-25 NOTE — Progress Notes (Signed)
Called patient's daughters Jezelle Gullick and Melinda Crutch to let them know patient was being discharge today.  Adler Alton will be bringing the patients oxygen tank.

## 2019-01-29 ENCOUNTER — Ambulatory Visit: Payer: Medicare Other | Admitting: Cardiothoracic Surgery

## 2019-01-31 ENCOUNTER — Telehealth (HOSPITAL_COMMUNITY): Payer: Medicare Other

## 2019-02-03 ENCOUNTER — Other Ambulatory Visit (HOSPITAL_COMMUNITY): Payer: Self-pay | Admitting: Internal Medicine

## 2019-02-04 ENCOUNTER — Telehealth (HOSPITAL_COMMUNITY): Payer: Self-pay

## 2019-02-04 NOTE — Telephone Encounter (Signed)
Pluraldrainage supplies and v/o for labs reviewed and signed by DM faxed to [facility], confirmation received.

## 2019-02-19 ENCOUNTER — Telehealth (HOSPITAL_COMMUNITY): Payer: Self-pay

## 2019-02-19 NOTE — Telephone Encounter (Signed)
Home health orders for drainage supplies, lab draws reviewed/ signed by DM and faxed, confirmation received

## 2019-02-24 DEATH — deceased

## 2019-02-26 ENCOUNTER — Ambulatory Visit: Payer: Medicare Other | Admitting: Cardiothoracic Surgery

## 2019-03-18 ENCOUNTER — Telehealth: Payer: Self-pay | Admitting: Gastroenterology

## 2019-03-18 NOTE — Telephone Encounter (Signed)
Recall sent 

## 2019-03-18 NOTE — Telephone Encounter (Signed)
RECALL FOR ULTRASOUND 

## 2019-07-09 ENCOUNTER — Telehealth: Payer: Self-pay

## 2019-07-09 NOTE — Telephone Encounter (Signed)
Called pt to set up Evisit with SW 07/11/2019, [id number]

## 2019-09-21 ENCOUNTER — Encounter: Payer: Self-pay | Admitting: Cardiology
